# Patient Record
Sex: Female | Born: 1940 | Race: White | Hispanic: No | Marital: Single | Smoking: Never smoker
Health system: Southern US, Community
[De-identification: ages and names within clinical notes are randomized; demographics above are authoritative.]

## PROBLEM LIST (undated history)

## (undated) DIAGNOSIS — Z9289 Personal history of other medical treatment: Secondary | ICD-10-CM

## (undated) DIAGNOSIS — F419 Anxiety disorder, unspecified: Secondary | ICD-10-CM

## (undated) DIAGNOSIS — A048 Other specified bacterial intestinal infections: Secondary | ICD-10-CM

## (undated) DIAGNOSIS — L03119 Cellulitis of unspecified part of limb: Secondary | ICD-10-CM

## (undated) DIAGNOSIS — M1990 Unspecified osteoarthritis, unspecified site: Secondary | ICD-10-CM

## (undated) DIAGNOSIS — E039 Hypothyroidism, unspecified: Secondary | ICD-10-CM

## (undated) DIAGNOSIS — H269 Unspecified cataract: Secondary | ICD-10-CM

## (undated) DIAGNOSIS — Z8711 Personal history of peptic ulcer disease: Secondary | ICD-10-CM

## (undated) DIAGNOSIS — G4730 Sleep apnea, unspecified: Secondary | ICD-10-CM

## (undated) DIAGNOSIS — J189 Pneumonia, unspecified organism: Secondary | ICD-10-CM

## (undated) DIAGNOSIS — I509 Heart failure, unspecified: Secondary | ICD-10-CM

## (undated) DIAGNOSIS — K219 Gastro-esophageal reflux disease without esophagitis: Secondary | ICD-10-CM

## (undated) DIAGNOSIS — I639 Cerebral infarction, unspecified: Secondary | ICD-10-CM

## (undated) DIAGNOSIS — E7800 Pure hypercholesterolemia, unspecified: Secondary | ICD-10-CM

## (undated) DIAGNOSIS — I959 Hypotension, unspecified: Secondary | ICD-10-CM

## (undated) DIAGNOSIS — I5022 Chronic systolic (congestive) heart failure: Secondary | ICD-10-CM

## (undated) DIAGNOSIS — L02419 Cutaneous abscess of limb, unspecified: Secondary | ICD-10-CM

## (undated) DIAGNOSIS — H409 Unspecified glaucoma: Secondary | ICD-10-CM

## (undated) DIAGNOSIS — I10 Essential (primary) hypertension: Secondary | ICD-10-CM

## (undated) DIAGNOSIS — I5023 Acute on chronic systolic (congestive) heart failure: Secondary | ICD-10-CM

## (undated) DIAGNOSIS — D649 Anemia, unspecified: Secondary | ICD-10-CM

## (undated) DIAGNOSIS — J42 Unspecified chronic bronchitis: Secondary | ICD-10-CM

## (undated) HISTORY — PX: NISSEN FUNDOPLICATION: SHX2091

## (undated) HISTORY — DX: Unspecified cataract: H26.9

## (undated) HISTORY — PX: BACK SURGERY: SHX140

## (undated) HISTORY — DX: Unspecified glaucoma: H40.9

## (undated) HISTORY — DX: Hypotension, unspecified: I95.9

## (undated) HISTORY — DX: Cellulitis of unspecified part of limb: L03.119

## (undated) HISTORY — DX: Cellulitis of unspecified part of limb: L02.419

## (undated) HISTORY — PX: THYROIDECTOMY: SHX17

## (undated) HISTORY — PX: TONSILLECTOMY: SUR1361

## (undated) HISTORY — DX: Chronic systolic (congestive) heart failure: I50.22

## (undated) HISTORY — PX: TENDON REPAIR: SHX5111

## (undated) HISTORY — DX: Other specified bacterial intestinal infections: A04.8

## (undated) HISTORY — DX: Acute on chronic systolic (congestive) heart failure: I50.23

---

## 2000-01-22 ENCOUNTER — Ambulatory Visit (HOSPITAL_COMMUNITY): Admission: RE | Admit: 2000-01-22 | Discharge: 2000-01-22 | Payer: Self-pay | Admitting: Gastroenterology

## 2001-09-30 ENCOUNTER — Encounter: Admission: RE | Admit: 2001-09-30 | Discharge: 2001-09-30 | Payer: Self-pay | Admitting: *Deleted

## 2003-05-06 ENCOUNTER — Encounter: Payer: Self-pay | Admitting: Internal Medicine

## 2003-05-06 ENCOUNTER — Encounter: Admission: RE | Admit: 2003-05-06 | Discharge: 2003-05-06 | Payer: Self-pay | Admitting: Internal Medicine

## 2007-09-23 ENCOUNTER — Inpatient Hospital Stay (HOSPITAL_COMMUNITY): Admission: EM | Admit: 2007-09-23 | Discharge: 2007-09-28 | Payer: Self-pay | Admitting: Emergency Medicine

## 2007-09-24 HISTORY — PX: HEMIARTHROPLASTY HIP: SUR652

## 2007-09-28 ENCOUNTER — Inpatient Hospital Stay: Admission: AD | Admit: 2007-09-28 | Discharge: 2007-11-17 | Payer: Self-pay | Admitting: Internal Medicine

## 2007-10-13 ENCOUNTER — Ambulatory Visit (HOSPITAL_COMMUNITY): Admission: RE | Admit: 2007-10-13 | Discharge: 2007-10-13 | Payer: Self-pay | Admitting: Internal Medicine

## 2009-05-12 ENCOUNTER — Encounter: Admission: RE | Admit: 2009-05-12 | Discharge: 2009-05-12 | Payer: Self-pay | Admitting: Neurology

## 2009-05-28 ENCOUNTER — Encounter: Admission: RE | Admit: 2009-05-28 | Discharge: 2009-05-28 | Payer: Self-pay | Admitting: Neurology

## 2009-06-06 ENCOUNTER — Encounter: Admission: RE | Admit: 2009-06-06 | Discharge: 2009-06-06 | Payer: Self-pay | Admitting: Neurology

## 2009-06-23 HISTORY — PX: KYPHOPLASTY: SHX5884

## 2009-07-13 ENCOUNTER — Ambulatory Visit (HOSPITAL_COMMUNITY): Admission: RE | Admit: 2009-07-13 | Discharge: 2009-07-14 | Payer: Self-pay | Admitting: Orthopedic Surgery

## 2010-10-22 ENCOUNTER — Other Ambulatory Visit: Payer: Self-pay | Admitting: Internal Medicine

## 2010-10-22 DIAGNOSIS — Z1239 Encounter for other screening for malignant neoplasm of breast: Secondary | ICD-10-CM

## 2010-11-08 ENCOUNTER — Ambulatory Visit: Payer: Self-pay

## 2010-11-22 ENCOUNTER — Ambulatory Visit
Admission: RE | Admit: 2010-11-22 | Discharge: 2010-11-22 | Disposition: A | Discharge status: Home / Self Care | Payer: Medicare Other | Source: Ambulatory Visit | Attending: *Deleted | Admitting: *Deleted

## 2010-11-22 DIAGNOSIS — Z1239 Encounter for other screening for malignant neoplasm of breast: Secondary | ICD-10-CM

## 2010-12-27 LAB — BASIC METABOLIC PANEL
BUN: 11 mg/dL (ref 6–23)
CO2: 28 mEq/L (ref 19–32)
Chloride: 108 mEq/L (ref 96–112)
Creatinine, Ser: 0.63 mg/dL (ref 0.4–1.2)
GFR calc non Af Amer: >60 mL/min (ref >60)
Potassium: 4.5 mEq/L (ref 3.5–5.1)
Sodium: 142 mEq/L (ref 135–145)

## 2010-12-27 LAB — CBC
HCT: 43.5 % (ref 36.0–46.0)
RDW: 14.4 % (ref 11.5–15.5)
WBC: 8.2 10*3/uL (ref 4.0–10.5)

## 2011-02-05 NOTE — Discharge Summary (Signed)
[name], [name] NO.:  [id number]   MEDICAL RECORD NO.:  [id number]          PATIENT TYPE:  INP   LOCATION:  5019                         FACILITY:  [facility]   PHYSICIAN:  Almedia Balls. Ranell Patrick, M.D. DATE OF BIRTH:  [birth year]   DATE OF ADMISSION:  09/23/2007  DATE OF DISCHARGE:  09/28/2007                               DISCHARGE SUMMARY   ADMISSION DIAGNOSES:  1. Right displaced femoral neck fracture.  2. Gluteus medius tear.   DISCHARGE DIAGNOSES:  1. Right displaced femoral neck fracture.  2. Gluteus medius tear.   BRIEF HISTORY:  The patient is a 70 year old female with Parkinson  disease who suffered a ground-level fall, injuring her right hip.  The  patient was patient admitted for surgical management for this right hip  fracture.   PROCEDURE:  The patient had a right hip hemiarthroplasty using the DePuy  Summit basic system and also a gluteus medius tendon repair by Dr.  Malon Kindle on September 24, 2007.  The assistant was [employer].  General anesthesia was used.  Estimated blood loss was 300 mL.  The  fluid replacement was 1200 mL.  No complications.   HOSPITAL COURSE:  The patient admitted on September 24, 2007, for the above-  stated procedure.  After adequate time in the post anesthesia care unit,  she was transferred up to 5000.  Postop day #1 the patient complained  about some moderate pain in the right hip but neurovascularly she was  intact distally.  She was able to perform very limited but gentle range  of motion and exercises with physical therapy over the next several days  before discharge to a skilled nursing facility.  The patient's labs were  within acceptable limits.  She did have some mild blood loss anemia but  not to the extent where she needed a transfusion.  The latest H&H was  9.7 and 28.5.  Daily dressing changes were observed with the Mepilex  dressing.  Neurovascularly she remained intact distally with no pedal  edema and  no calf tenderness.   DISCHARGE PLAN:  The patient will be discharged to a skilled nursing  facility on September 28, 2007.   The patient has allergies CODEINE and PENICILLIN.   DISCHARGE MEDICATIONS:  1. Zebeta 500 mg p.o. daily.  2. Carbidopa-levodopa 10 mg p.o. q.i.d.  3. Hydrochlorothiazide 6.25 mg p.o. daily.  4. Coumadin per pharmacy protocol.  5. Robaxin 500 mg one tablet q.6h, p.r.n. spasm.  6. Percocet one to two tablets q.4-6h. p.r.n. pain, that is 5 mg of      the Percocet.   FOLLOW-UP:  The patient will follow back up with Dr. Malon Kindle.   CONDITION:  Guarded.   DIET:  Regular.      Thomas B. Durwin Nora, P.A.      Almedia Balls. Ranell Patrick, M.D.  Electronically Signed    TBD/MEDQ  D:  09/25/2007  T:  09/25/2007  Job:  [id number]

## 2011-02-05 NOTE — Op Note (Signed)
[name], [name] NO.:  [id number]   MEDICAL RECORD NO.:  [id number]          PATIENT TYPE:  INP   LOCATION:  5019                         FACILITY:  [facility]   PHYSICIAN:  Almedia Balls. Ranell Patrick, M.D. DATE OF BIRTH:  1941/02/03   DATE OF PROCEDURE:  09/24/2007  DATE OF DISCHARGE:                               OPERATIVE REPORT   PREOPERATIVE DIAGNOSIS:  Right displaced femoral neck fracture.   POSTOPERATIVE DIAGNOSIS:  Right displaced femoral neck fracture, gluteus  medius tear.   OPERATION PERFORMED:  Right hip hemiarthroplasty using Depuy Summit  system.  Gluteus medius tendon repair.   SURGEON:  Almedia Balls. Ranell Patrick, M.D.   ASSISTANT:  Donnie Coffin. Durwin Nora, P.A.   ANESTHESIA:  General.   ESTIMATED BLOOD LOSS:  300 mL.   FLUIDS REPLACED:  1200 mL crystalloid.   URINE OUTPUT:  250 mL.   INSTRUMENT COUNT:  Correct.   COMPLICATIONS:  None.   ANTIBIOTICS:  Perioperative antibiotics given.   INDICATIONS FOR PROCEDURE:  The patient is a 70 year old female with  Parkinson's who suffered ground level fall while taking out the trash.  The patient presented to the [facility] Emergency Department with a  displaced femoral neck fracture unable to walk.  The patient now  presents for operative treatment to restore proximal femoral anatomy and  allow for mobilization.  Informed consent was obtained.   DESCRIPTION OF PROCEDURE:  After an adequate level of anesthesia was  achieved, the patient was positioned in the left lateral decubitus  position with the right hip up.  After sterile prep and drape of the  right hip, we approached the hip through a posterior Kocher-Langebach  incision.  Dissection was carried down through subcutaneous tissues.  Bovie electrocautery used to control hemostasis.  I identified the  tensor fascia lata, split that in line with the incision, identified the  gluteus medius which was torn.  Noted the gluteus medius to be split and  essentially the  trochanter was button holed through that.  We made plans  for repairing that at the end of the surgery.  At this point we  identified and we retracted the split portions of the gluteus medius and  then divided the short external rotators off the posterior femur.  We  made our neck cut using a broach as a guide one fingerbreadth above the  lesser trochanter.  We then removed the head and sized this to a 45  head.  We trialed that in the hip socket and removed the ligamentum  teres.  The trial was secure with a nice suction fit.  We then prepared  the femoral canal with sequential reaming and broaching up to a size 2  stem.  With this in place, we trialed with a +0 neck and a 45 head and  had nice length and soft tissue balance.  We removed the trial  components and then cemented using the cement restrictor and third  generation vacuum mixing technique and pressurization the Summit basic  stem size 2 with appropriate anteversion.  Once this was allowed to  harden, we added the +0 neck and 45 head, impacted that in place,  reduced the hip, had nice balance and soft tissue tension and stability.  We then repaired the piriformis back to the gluteus medius to itself  with interrupted Ethibond suture which was figure-of-eight x4 stitches.  This nicely reapproximated the gluteus medius and no longer was the  greater trochanter button holing through that and restored the integrity  of that tendon.  We then closed the tensor fascia lata over a drain with  interrupted figure-of-eight #1 Vicryl suture followed by 2-0 Vicryl  subcutaneous closure with 4-0 Monocryl for the skin.  Steri-Strips were  applied followed by sterile dressing.  The patient tolerated the surgery  well.      Almedia Balls. Ranell Patrick, M.D.  Electronically Signed     SRN/MEDQ  D:  09/24/2007  T:  09/24/2007  Job:  [id number]

## 2011-02-08 NOTE — Procedures (Signed)
[place]  Patient:    [name], [name]                         MRN: [id number] Proc. Date: 01/22/00 Adm. Date:  [id number] Attending:  Charna Elizabeth CC:         Warrick Parisian, M.D.                           Procedure Report  DATE OF BIRTH:  [birth year]  REFERRING PHYSICIAN:  Warrick Parisian, M.D.  PROCEDURE PERFORMED:  Screening flexible sigmoidoscopy.  ENDOSCOPIST:  Anselmo Rod, M.D.  INSTRUMENT USED:  Olympus video colonoscope.  INDICATIONS:  Colorectal cancer screening in a 70 year old white female with no history of blood in stool or family history of colon cancer, change in bowel habits, etc.  PREPROCEDURE PREPARATION:  Informed consent was procured from the patient. The patient was fasted for 8 hours prior to the procedure and prepped with two Fleets enemas the morning of the procedure.  PREPROCEDURE PHYSICAL:  Patient has stable vital signs.  NECK: Supple.  CHEST:  Clear to auscultation. S1, S2 regular.  ABDOMEN:  Soft with normal abdominal bowel sounds.  DESCRIPTION OF PROCEDURE:  The patient was placed in left lateral decubitus position and no sedation was used.  Once the patient was adequately positioned, the Olympus video colonoscope was advanced from the rectum to 70 cm without difficulty.  Except for a few early left-sided diverticula, no other abnormalities were seen.  The patient had small internal and small external hemorrhoid.  No mass or polyps were present.  There was solid stool present at 70 cm and therefore the procedure was aborted at this point.  IMPRESSION: 1. No masses or polyps present up to 70 cm. 2. Early left-sided diverticular disease. 3. Small internal and external hemorrhoids.  RECOMMENDATIONS: 1. The patient has been advised to increase the fluid and fiber in her diet. 2. Repeat colorectal cancer screening was recommended in the next five years    or earlier if need be. DD:   01/22/00 TD:  01/23/00 Job: [id number]

## 2011-06-13 LAB — PROTIME-INR
INR: 1.1
INR: 1.6 — ABNORMAL HIGH
Prothrombin Time: 14.9
Prothrombin Time: 16.7 — ABNORMAL HIGH
Prothrombin Time: 19.3 — ABNORMAL HIGH

## 2011-06-13 LAB — BASIC METABOLIC PANEL
BUN: 10
BUN: 10
BUN: 7
CO2: 26
CO2: 27
CO2: 28
Calcium: 7.8 — ABNORMAL LOW
Calcium: 8 — ABNORMAL LOW
Calcium: 8.2 — ABNORMAL LOW
Calcium: 8.3 — ABNORMAL LOW
Chloride: 100
Creatinine, Ser: 0.66
GFR calc Af Amer: >60
GFR calc non Af Amer: >60
GFR calc non Af Amer: >60
Glucose, Bld: 115 — ABNORMAL HIGH
Glucose, Bld: 130 — ABNORMAL HIGH
Potassium: 3.5
Sodium: 134 — ABNORMAL LOW
Sodium: 135
Sodium: 137

## 2011-06-13 LAB — CBC
HCT: 28.5 — ABNORMAL LOW
Hemoglobin: 10.6 — ABNORMAL LOW
Hemoglobin: 9.9 — ABNORMAL LOW
MCV: 86.8
Platelets: 109 — ABNORMAL LOW
Platelets: 112 — ABNORMAL LOW
RBC: 3.37 — ABNORMAL LOW
RBC: 3.66 — ABNORMAL LOW
RDW: 13.7
WBC: 11.5 — ABNORMAL HIGH
WBC: 9

## 2011-06-28 LAB — CBC
HCT: 39.3
Hemoglobin: 13.1
MCV: 87.3
Platelets: 171
RDW: 13.9
WBC: 17.2 — ABNORMAL HIGH

## 2011-06-28 LAB — DIFFERENTIAL
Basophils Absolute: 0
Eosinophils Relative: 0
Lymphocytes Relative: 5 — ABNORMAL LOW
Lymphs Abs: 0.9
Monocytes Absolute: 1
Neutro Abs: 15.3 — ABNORMAL HIGH

## 2011-06-28 LAB — URINALYSIS, ROUTINE W REFLEX MICROSCOPIC
Nitrite: NEGATIVE
Specific Gravity, Urine: 1.027
Urobilinogen, UA: 0.2
pH: 5

## 2011-06-28 LAB — I-STAT 8, (EC8 V) (CONVERTED LAB)
BUN: 19
Bicarbonate: 23.3
Glucose, Bld: 142 — ABNORMAL HIGH
HCT: 43
Hemoglobin: 14.6
Operator id: 208821
Potassium: 3.9
Sodium: 139
TCO2: 24

## 2011-06-28 LAB — PROTIME-INR: INR: 0.9

## 2011-06-28 LAB — ABO/RH: ABO/RH(D): O POS

## 2011-06-28 LAB — TYPE AND SCREEN

## 2011-06-28 LAB — APTT: aPTT: 28

## 2011-09-13 DIAGNOSIS — G20 Parkinson's disease: Secondary | ICD-10-CM | POA: Diagnosis not present

## 2011-09-13 DIAGNOSIS — R279 Unspecified lack of coordination: Secondary | ICD-10-CM | POA: Diagnosis not present

## 2011-09-13 DIAGNOSIS — R269 Unspecified abnormalities of gait and mobility: Secondary | ICD-10-CM | POA: Diagnosis not present

## 2011-09-13 DIAGNOSIS — M25519 Pain in unspecified shoulder: Secondary | ICD-10-CM | POA: Diagnosis not present

## 2011-09-13 DIAGNOSIS — IMO0001 Reserved for inherently not codable concepts without codable children: Secondary | ICD-10-CM | POA: Diagnosis not present

## 2011-09-13 DIAGNOSIS — M159 Polyosteoarthritis, unspecified: Secondary | ICD-10-CM | POA: Diagnosis not present

## 2011-09-13 DIAGNOSIS — I10 Essential (primary) hypertension: Secondary | ICD-10-CM | POA: Diagnosis not present

## 2011-09-13 DIAGNOSIS — M545 Low back pain: Secondary | ICD-10-CM | POA: Diagnosis not present

## 2011-09-20 DIAGNOSIS — R269 Unspecified abnormalities of gait and mobility: Secondary | ICD-10-CM | POA: Diagnosis not present

## 2011-09-20 DIAGNOSIS — I10 Essential (primary) hypertension: Secondary | ICD-10-CM | POA: Diagnosis not present

## 2011-09-20 DIAGNOSIS — IMO0001 Reserved for inherently not codable concepts without codable children: Secondary | ICD-10-CM | POA: Diagnosis not present

## 2011-09-20 DIAGNOSIS — M159 Polyosteoarthritis, unspecified: Secondary | ICD-10-CM | POA: Diagnosis not present

## 2011-09-20 DIAGNOSIS — G20 Parkinson's disease: Secondary | ICD-10-CM | POA: Diagnosis not present

## 2011-09-20 DIAGNOSIS — R279 Unspecified lack of coordination: Secondary | ICD-10-CM | POA: Diagnosis not present

## 2011-09-21 DIAGNOSIS — I10 Essential (primary) hypertension: Secondary | ICD-10-CM | POA: Diagnosis not present

## 2011-09-21 DIAGNOSIS — G20 Parkinson's disease: Secondary | ICD-10-CM | POA: Diagnosis not present

## 2011-09-21 DIAGNOSIS — R269 Unspecified abnormalities of gait and mobility: Secondary | ICD-10-CM | POA: Diagnosis not present

## 2011-09-21 DIAGNOSIS — IMO0001 Reserved for inherently not codable concepts without codable children: Secondary | ICD-10-CM | POA: Diagnosis not present

## 2011-09-21 DIAGNOSIS — R279 Unspecified lack of coordination: Secondary | ICD-10-CM | POA: Diagnosis not present

## 2011-09-21 DIAGNOSIS — M159 Polyosteoarthritis, unspecified: Secondary | ICD-10-CM | POA: Diagnosis not present

## 2011-09-23 DIAGNOSIS — M159 Polyosteoarthritis, unspecified: Secondary | ICD-10-CM | POA: Diagnosis not present

## 2011-09-23 DIAGNOSIS — G20 Parkinson's disease: Secondary | ICD-10-CM | POA: Diagnosis not present

## 2011-09-23 DIAGNOSIS — R279 Unspecified lack of coordination: Secondary | ICD-10-CM | POA: Diagnosis not present

## 2011-09-23 DIAGNOSIS — I10 Essential (primary) hypertension: Secondary | ICD-10-CM | POA: Diagnosis not present

## 2011-09-23 DIAGNOSIS — R269 Unspecified abnormalities of gait and mobility: Secondary | ICD-10-CM | POA: Diagnosis not present

## 2011-09-23 DIAGNOSIS — IMO0001 Reserved for inherently not codable concepts without codable children: Secondary | ICD-10-CM | POA: Diagnosis not present

## 2011-09-25 ENCOUNTER — Inpatient Hospital Stay (HOSPITAL_COMMUNITY)
Admission: EM | Admit: 2011-09-25 | Discharge: 2011-09-30 | DRG: 377 | Disposition: A | Discharge status: Home Health | Payer: Medicare Other | Attending: Internal Medicine | Admitting: Internal Medicine

## 2011-09-25 ENCOUNTER — Encounter: Payer: Self-pay | Admitting: Emergency Medicine

## 2011-09-25 DIAGNOSIS — J690 Pneumonitis due to inhalation of food and vomit: Secondary | ICD-10-CM | POA: Diagnosis present

## 2011-09-25 DIAGNOSIS — D62 Acute posthemorrhagic anemia: Secondary | ICD-10-CM | POA: Diagnosis present

## 2011-09-25 DIAGNOSIS — Z880 Allergy status to penicillin: Secondary | ICD-10-CM

## 2011-09-25 DIAGNOSIS — D509 Iron deficiency anemia, unspecified: Secondary | ICD-10-CM | POA: Diagnosis present

## 2011-09-25 DIAGNOSIS — E0789 Other specified disorders of thyroid: Secondary | ICD-10-CM | POA: Diagnosis present

## 2011-09-25 DIAGNOSIS — K922 Gastrointestinal hemorrhage, unspecified: Secondary | ICD-10-CM | POA: Diagnosis not present

## 2011-09-25 DIAGNOSIS — R1110 Vomiting, unspecified: Secondary | ICD-10-CM | POA: Diagnosis not present

## 2011-09-25 DIAGNOSIS — D72829 Elevated white blood cell count, unspecified: Secondary | ICD-10-CM | POA: Diagnosis present

## 2011-09-25 DIAGNOSIS — G20 Parkinson's disease: Secondary | ICD-10-CM | POA: Diagnosis present

## 2011-09-25 DIAGNOSIS — K920 Hematemesis: Secondary | ICD-10-CM | POA: Diagnosis not present

## 2011-09-25 DIAGNOSIS — K254 Chronic or unspecified gastric ulcer with hemorrhage: Secondary | ICD-10-CM | POA: Diagnosis not present

## 2011-09-25 DIAGNOSIS — J984 Other disorders of lung: Secondary | ICD-10-CM | POA: Diagnosis not present

## 2011-09-25 DIAGNOSIS — I959 Hypotension, unspecified: Secondary | ICD-10-CM | POA: Diagnosis present

## 2011-09-25 DIAGNOSIS — Z886 Allergy status to analgesic agent status: Secondary | ICD-10-CM

## 2011-09-25 DIAGNOSIS — R269 Unspecified abnormalities of gait and mobility: Secondary | ICD-10-CM | POA: Diagnosis not present

## 2011-09-25 DIAGNOSIS — R079 Chest pain, unspecified: Secondary | ICD-10-CM | POA: Diagnosis not present

## 2011-09-25 DIAGNOSIS — I10 Essential (primary) hypertension: Secondary | ICD-10-CM | POA: Diagnosis not present

## 2011-09-25 DIAGNOSIS — R279 Unspecified lack of coordination: Secondary | ICD-10-CM | POA: Diagnosis not present

## 2011-09-25 DIAGNOSIS — K208 Other esophagitis without bleeding: Secondary | ICD-10-CM | POA: Diagnosis present

## 2011-09-25 DIAGNOSIS — K259 Gastric ulcer, unspecified as acute or chronic, without hemorrhage or perforation: Secondary | ICD-10-CM | POA: Diagnosis present

## 2011-09-25 DIAGNOSIS — R918 Other nonspecific abnormal finding of lung field: Secondary | ICD-10-CM | POA: Diagnosis not present

## 2011-09-25 DIAGNOSIS — IMO0001 Reserved for inherently not codable concepts without codable children: Secondary | ICD-10-CM | POA: Diagnosis not present

## 2011-09-25 DIAGNOSIS — K209 Esophagitis, unspecified without bleeding: Secondary | ICD-10-CM | POA: Diagnosis present

## 2011-09-25 DIAGNOSIS — G20A1 Parkinson's disease without dyskinesia, without mention of fluctuations: Secondary | ICD-10-CM | POA: Diagnosis present

## 2011-09-25 DIAGNOSIS — K2210 Ulcer of esophagus without bleeding: Secondary | ICD-10-CM

## 2011-09-25 DIAGNOSIS — M159 Polyosteoarthritis, unspecified: Secondary | ICD-10-CM | POA: Diagnosis not present

## 2011-09-25 HISTORY — DX: Anemia, unspecified: D64.9

## 2011-09-25 HISTORY — DX: Essential (primary) hypertension: I10

## 2011-09-25 HISTORY — DX: Unspecified osteoarthritis, unspecified site: M19.90

## 2011-09-25 LAB — COMPREHENSIVE METABOLIC PANEL
ALT: 8 U/L (ref 0–35)
Alkaline Phosphatase: 68 U/L (ref 39–117)
BUN: 21 mg/dL (ref 6–23)
CO2: 28 mEq/L (ref 19–32)
Chloride: 105 mEq/L (ref 96–112)
GFR calc Af Amer: 77 mL/min — ABNORMAL LOW (ref >90)
Glucose, Bld: 143 mg/dL — ABNORMAL HIGH (ref 70–99)
Potassium: 4.8 mEq/L (ref 3.5–5.1)
Sodium: 139 mEq/L (ref 135–145)
Total Bilirubin: 0.2 mg/dL — ABNORMAL LOW (ref 0.3–1.2)
Total Protein: 6 g/dL (ref 6.0–8.3)

## 2011-09-25 LAB — DIFFERENTIAL
Eosinophils Absolute: 0.1 10*3/uL (ref 0.0–0.7)
Lymphocytes Relative: 8 % — ABNORMAL LOW (ref 12–46)
Lymphs Abs: 0.9 10*3/uL (ref 0.7–4.0)
Monocytes Relative: 6 % (ref 3–12)
Neutro Abs: 9.6 10*3/uL — ABNORMAL HIGH (ref 1.7–7.7)
Neutrophils Relative %: 84 % — ABNORMAL HIGH (ref 43–77)

## 2011-09-25 LAB — CBC
Hemoglobin: 9.9 g/dL — ABNORMAL LOW (ref 12.0–15.0)
Platelets: 221 10*3/uL (ref 150–400)
RBC: 3.66 MIL/uL — ABNORMAL LOW (ref 3.87–5.11)
WBC: 11.4 10*3/uL — ABNORMAL HIGH (ref 4.0–10.5)

## 2011-09-25 MED ORDER — ONDANSETRON HCL 4 MG/2ML IJ SOLN
4.0000 mg | Freq: Once | INTRAMUSCULAR | Status: AC
  Administered 2011-09-25: 4 mg via INTRAVENOUS
  Filled 2011-09-25 (×2): qty 2

## 2011-09-25 MED ORDER — SODIUM CHLORIDE 0.9 % IV BOLUS (SEPSIS)
1000.0000 mL | Freq: Once | INTRAVENOUS | Status: AC
  Administered 2011-09-25: 1000 mL via INTRAVENOUS

## 2011-09-25 MED ORDER — PANTOPRAZOLE SODIUM 40 MG IV SOLR
40.0000 mg | Freq: Once | INTRAVENOUS | Status: AC
  Administered 2011-09-25: 40 mg via INTRAVENOUS
  Filled 2011-09-25: qty 40

## 2011-09-25 NOTE — ED Provider Notes (Signed)
History     CSN: [id number]  Arrival date & time 09/25/11  2211   First MD Initiated Contact with Patient 09/25/11 2330      Chief Complaint  Patient presents with  . GI Bleeding    (Consider location/radiation/quality/duration/timing/severity/associated sxs/prior treatment) HPI  Past Medical History  Diagnosis Date  . Parkinson's disease   . Hypertension   . Arthritis   . Anemia     Past Surgical History  Procedure Date  . Hip arthroscopy w/ labral repair   . Thyroidectomy     No family history on file.  History  Substance Use Topics  . Smoking status: Never Smoker   . Smokeless tobacco: Not on file  . Alcohol Use: No    OB History    Grav Para Term Preterm Abortions TAB SAB Ect Mult Living                  Review of Systems  Allergies  Codeine and Penicillins  Home Medications   Current Outpatient Rx  Name Route Sig Dispense Refill  . ASPIRIN 81 MG PO CHEW Oral Chew 81 mg by mouth daily.      . B COMPLEX PO TABS Oral Take 1 tablet by mouth daily.      Marland Kitchen BISOPROLOL FUMARATE 5 MG PO TABS Oral Take 5 mg by mouth daily.      Marland Kitchen CARBIDOPA-LEVODOPA ER 50-200 MG PO TBCR Oral Take 1 tablet by mouth 2 (two) times daily.      . FUROSEMIDE 40 MG PO TABS Oral Take 40 mg by mouth daily.      Marland Kitchen MAGNESIUM OXIDE 400 MG PO TABS Oral Take 400 mg by mouth daily.      Marland Kitchen POTASSIUM CHLORIDE CRYS ER 20 MEQ PO TBCR Oral Take 20 mEq by mouth daily.        BP 110/51  Pulse 72  Temp(Src) 98 F (36.7 C) (Oral)  Resp 20  SpO2 96%  Physical Exam  ED Course  Procedures (including critical care time)  Labs Reviewed  CBC - Abnormal; Notable for the following:    WBC 11.4 (*)    RBC 3.66 (*)    Hemoglobin 9.9 (*)    HCT 31.7 (*)    All other components within normal limits  DIFFERENTIAL - Abnormal; Notable for the following:    Neutrophils Relative 84 (*)    Neutro Abs 9.6 (*)    Lymphocytes Relative 8 (*)    All other components within normal limits    COMPREHENSIVE METABOLIC PANEL - Abnormal; Notable for the following:    Glucose, Bld 143 (*)    Albumin 3.0 (*)    Total Bilirubin 0.2 (*)    GFR calc non Af Amer 66 (*)    GFR calc Af Amer 77 (*)    All other components within normal limits  TYPE AND SCREEN  POCT OCCULT BLOOD STOOL, DEVICE   No results found.   No diagnosis found.    MDM  Not my patient        Doug Sou, MD 09/26/11 [phone]

## 2011-09-25 NOTE — ED Notes (Signed)
PT. REPORTS BLOODY EMESIS ONSET THIS EVENING , DENIES PAIN OR DISCOMFORT , NO DIARRHEA , NO FEVER OR CHILLS.

## 2011-09-26 ENCOUNTER — Encounter (HOSPITAL_COMMUNITY): Payer: Self-pay | Admitting: Internal Medicine

## 2011-09-26 ENCOUNTER — Encounter (HOSPITAL_COMMUNITY)
Admission: EM | Disposition: A | Discharge status: Home Health | Payer: Self-pay | Source: Home / Self Care | Attending: Internal Medicine

## 2011-09-26 ENCOUNTER — Inpatient Hospital Stay (HOSPITAL_COMMUNITY): Payer: Medicare Other

## 2011-09-26 ENCOUNTER — Emergency Department (HOSPITAL_COMMUNITY): Payer: Medicare Other

## 2011-09-26 ENCOUNTER — Other Ambulatory Visit: Payer: Self-pay

## 2011-09-26 DIAGNOSIS — I959 Hypotension, unspecified: Secondary | ICD-10-CM | POA: Diagnosis present

## 2011-09-26 DIAGNOSIS — K922 Gastrointestinal hemorrhage, unspecified: Secondary | ICD-10-CM

## 2011-09-26 DIAGNOSIS — K2210 Ulcer of esophagus without bleeding: Secondary | ICD-10-CM | POA: Diagnosis present

## 2011-09-26 DIAGNOSIS — D62 Acute posthemorrhagic anemia: Secondary | ICD-10-CM

## 2011-09-26 DIAGNOSIS — K250 Acute gastric ulcer with hemorrhage: Secondary | ICD-10-CM | POA: Diagnosis not present

## 2011-09-26 DIAGNOSIS — D509 Iron deficiency anemia, unspecified: Secondary | ICD-10-CM | POA: Diagnosis present

## 2011-09-26 DIAGNOSIS — K254 Chronic or unspecified gastric ulcer with hemorrhage: Secondary | ICD-10-CM | POA: Diagnosis not present

## 2011-09-26 DIAGNOSIS — Z880 Allergy status to penicillin: Secondary | ICD-10-CM | POA: Diagnosis not present

## 2011-09-26 DIAGNOSIS — D72829 Elevated white blood cell count, unspecified: Secondary | ICD-10-CM | POA: Diagnosis present

## 2011-09-26 DIAGNOSIS — I10 Essential (primary) hypertension: Secondary | ICD-10-CM | POA: Diagnosis present

## 2011-09-26 DIAGNOSIS — Z09 Encounter for follow-up examination after completed treatment for conditions other than malignant neoplasm: Secondary | ICD-10-CM | POA: Diagnosis not present

## 2011-09-26 DIAGNOSIS — R079 Chest pain, unspecified: Secondary | ICD-10-CM | POA: Diagnosis not present

## 2011-09-26 DIAGNOSIS — R1110 Vomiting, unspecified: Secondary | ICD-10-CM | POA: Diagnosis not present

## 2011-09-26 DIAGNOSIS — G20 Parkinson's disease: Secondary | ICD-10-CM | POA: Diagnosis present

## 2011-09-26 DIAGNOSIS — R918 Other nonspecific abnormal finding of lung field: Secondary | ICD-10-CM | POA: Diagnosis not present

## 2011-09-26 DIAGNOSIS — Z886 Allergy status to analgesic agent status: Secondary | ICD-10-CM | POA: Diagnosis not present

## 2011-09-26 DIAGNOSIS — K208 Other esophagitis without bleeding: Secondary | ICD-10-CM | POA: Diagnosis not present

## 2011-09-26 DIAGNOSIS — J690 Pneumonitis due to inhalation of food and vomit: Secondary | ICD-10-CM | POA: Diagnosis not present

## 2011-09-26 DIAGNOSIS — K920 Hematemesis: Secondary | ICD-10-CM

## 2011-09-26 DIAGNOSIS — G20A1 Parkinson's disease without dyskinesia, without mention of fluctuations: Secondary | ICD-10-CM | POA: Diagnosis not present

## 2011-09-26 DIAGNOSIS — J984 Other disorders of lung: Secondary | ICD-10-CM | POA: Diagnosis not present

## 2011-09-26 HISTORY — PX: ESOPHAGOGASTRODUODENOSCOPY: SHX5428

## 2011-09-26 LAB — CBC
HCT: 23.8 % — ABNORMAL LOW (ref 36.0–46.0)
HCT: 28.6 % — ABNORMAL LOW (ref 36.0–46.0)
Hemoglobin: 7.4 g/dL — ABNORMAL LOW (ref 12.0–15.0)
Hemoglobin: 9.4 g/dL — ABNORMAL LOW (ref 12.0–15.0)
MCHC: 32.9 g/dL (ref 30.0–36.0)
MCV: 86.5 fL (ref 78.0–100.0)
Platelets: 193 10*3/uL (ref 150–400)
RBC: 2.75 MIL/uL — ABNORMAL LOW (ref 3.87–5.11)
RBC: 3.39 MIL/uL — ABNORMAL LOW (ref 3.87–5.11)
WBC: 9 10*3/uL (ref 4.0–10.5)

## 2011-09-26 LAB — HEMOGLOBIN AND HEMATOCRIT, BLOOD: Hemoglobin: 9 g/dL — ABNORMAL LOW (ref 12.0–15.0)

## 2011-09-26 SURGERY — EGD (ESOPHAGOGASTRODUODENOSCOPY)
Anesthesia: Moderate Sedation

## 2011-09-26 MED ORDER — BUTAMBEN-TETRACAINE-BENZOCAINE 2-2-14 % EX AERO
INHALATION_SPRAY | CUTANEOUS | Status: DC | PRN
  Administered 2011-09-26: 2 via TOPICAL

## 2011-09-26 MED ORDER — MIDAZOLAM HCL 10 MG/2ML IJ SOLN
INTRAMUSCULAR | Status: AC
  Filled 2011-09-26: qty 2

## 2011-09-26 MED ORDER — CARBIDOPA-LEVODOPA ER 50-200 MG PO TBCR
1.0000 | EXTENDED_RELEASE_TABLET | Freq: Once | ORAL | Status: AC
  Administered 2011-09-26: 1 via ORAL
  Filled 2011-09-26: qty 1

## 2011-09-26 MED ORDER — FENTANYL CITRATE 0.05 MG/ML IJ SOLN
INTRAMUSCULAR | Status: DC | PRN
  Administered 2011-09-26: 25 ug via INTRAVENOUS

## 2011-09-26 MED ORDER — SODIUM CHLORIDE 0.9 % IJ SOLN
INTRAMUSCULAR | Status: DC | PRN
  Administered 2011-09-26: 16:00:00

## 2011-09-26 MED ORDER — SODIUM CHLORIDE 0.9 % IV SOLN
8.0000 mg/h | INTRAVENOUS | Status: DC
  Administered 2011-09-26 – 2011-09-28 (×5): 8 mg/h via INTRAVENOUS
  Filled 2011-09-26 (×11): qty 80

## 2011-09-26 MED ORDER — ONDANSETRON HCL 4 MG/2ML IJ SOLN: 4.0000 mg | Freq: Four times a day (QID) | INTRAMUSCULAR | Status: DC | PRN

## 2011-09-26 MED ORDER — FENTANYL CITRATE 0.05 MG/ML IJ SOLN
INTRAMUSCULAR | Status: AC
  Filled 2011-09-26: qty 2

## 2011-09-26 MED ORDER — SODIUM CHLORIDE 0.45 % IV SOLN: Freq: Once | INTRAVENOUS | Status: DC

## 2011-09-26 MED ORDER — SODIUM CHLORIDE 0.9 % IV SOLN
INTRAVENOUS | Status: DC
  Administered 2011-09-26 – 2011-09-30 (×8): via INTRAVENOUS
  Filled 2011-09-26: qty 1000

## 2011-09-26 MED ORDER — MIDAZOLAM HCL 10 MG/2ML IJ SOLN
INTRAMUSCULAR | Status: DC | PRN
  Administered 2011-09-26: 2 mg via INTRAVENOUS
  Administered 2011-09-26: 1 mg via INTRAVENOUS

## 2011-09-26 MED ORDER — SODIUM CHLORIDE 0.9 % IV SOLN: INTRAVENOUS | Status: DC

## 2011-09-26 NOTE — Progress Notes (Signed)
[facility]  71 year old female admitted during the night with hematemesis. Hemoglobin at presentation was 9.9. Since admission she has begun passing maroon-colored stools and on rectal exam by the gastroenterology team she appeared to have bright red blood now and her rectal vault. Her most recent hemoglobin had decreased to 7.4 at 5 AM and she is currently in the process of receiving the first of 2 units of packed red blood cells. She is also had intermittent hypotension since admission. Until blood products were available she has been aggressively rehydrated with crystalloids. After arrival to the step down unit her blood pressure dipped as low as 76/30. Upon our initial evaluation of the patient ordered this morning her systolic blood pressure was 85 but she was alert and arousable and able to give appropriate history. Because of concerns of more profound hypotension with sedation for endoscopy procedure that was indicated urgently today because of active GI bleeding Dr. Rhea Belton with gastroenterology service requested we obtain alternative IV access. He also expressed concern she may require pressor support and therefore central line was indicated. In the interim her blood infusion is now running in the 150 cc per hour. Dr. Delton Coombes with critical care medicine was consulted and a central line was subsequently placed without difficulty. Her blood pressure now is much more stable in the 94-100 range systolic. We will continue to monitor for recurrent GI bleeding and follow serial CBC readings. Await results of endoscopic evaluation.  Junious Silk, ANP [phone]

## 2011-09-26 NOTE — H&P (Signed)
[name] is an 71 y.o. female.   Chief Complaint: Vomiting Blood HPI: 71 YO woman here with sudden onset of vomiting blood. She ate dinner earlier in the evening and by 9.30pm, she started having nausea and then vomited frank blood. She has so far vomited 3 times including in the ER. No anticoagulants, no prior history of GI bleed. Takes Baby aspirin only. No melena, no BRBPR. No recent GI workup. Since arriving the ER she has been feeling dizzy and weak. Has also been dropping her Blood pressure. Her initial hemoglobin is 9.9. Her CXR indicated some evidence of aspiration probably from hematemesis but she has no SOB.  Past Medical History  Diagnosis Date  . Parkinson's disease   . Hypertension   . Arthritis   . Anemia     Past Surgical History  Procedure Date  . Hip arthroscopy w/ labral repair   . Thyroidectomy     Family History  Problem Relation Age of Onset  . GER disease Mother    Social History:  reports that she has never smoked. She does not have any smokeless tobacco history on file. She reports that she does not drink alcohol or use illicit drugs.  Allergies:  Allergies  Allergen Reactions  . Codeine Other (See Comments)    unknown  . Penicillins Other (See Comments)    unknown    Medications Prior to Admission  Medication Dose Route Frequency Provider Last Rate Last Dose  . 0.9 %  sodium chloride infusion   Intravenous Continuous Doug Sou, MD      . 0.9 %  sodium chloride infusion   Intravenous Continuous Lawal Cionna Collantes 200 mL/hr at 09/26/11 0342    . ondansetron (ZOFRAN) injection 4 mg  4 mg Intravenous Once Sunnie Nielsen, MD   4 mg at 09/25/11 2350  . ondansetron (ZOFRAN) injection 4 mg  4 mg Intravenous Q6H PRN Lawal Cesareo Vickrey      . pantoprazole (PROTONIX) injection 40 mg  40 mg Intravenous Once Sunnie Nielsen, MD   40 mg at 09/25/11 2352  . sodium chloride 0.9 % bolus 1,000 mL  1,000 mL Intravenous Once Sunnie Nielsen, MD   1,000 mL at 09/25/11 2345   No current  outpatient prescriptions on file as of 09/26/2011.    Results for orders placed during the hospital encounter of 09/25/11 (from the past 48 hour(s))  CBC     Status: Abnormal   Collection Time   09/25/11 10:43 PM      Component Value Range Comment   WBC 11.4 (*) 4.0 - 10.5 (K/uL)    RBC 3.66 (*) 3.87 - 5.11 (MIL/uL)    Hemoglobin 9.9 (*) 12.0 - 15.0 (g/dL)    HCT 16.1 (*) 09.6 - 46.0 (%)    MCV 86.6  78.0 - 100.0 (fL)    MCH 27.0  26.0 - 34.0 (pg)    MCHC 31.2  30.0 - 36.0 (g/dL)    RDW 04.5  40.9 - 81.1 (%)    Platelets 221  150 - 400 (K/uL)   DIFFERENTIAL     Status: Abnormal   Collection Time   09/25/11 10:43 PM      Component Value Range Comment   Neutrophils Relative 84 (*) 43 - 77 (%)    Neutro Abs 9.6 (*) 1.7 - 7.7 (K/uL)    Lymphocytes Relative 8 (*) 12 - 46 (%)    Lymphs Abs 0.9  0.7 - 4.0 (K/uL)    Monocytes Relative 6  3 - 12 (%)    Monocytes Absolute 0.7  0.1 - 1.0 (K/uL)    Eosinophils Relative 1  0 - 5 (%)    Eosinophils Absolute 0.1  0.0 - 0.7 (K/uL)    Basophils Relative 0  0 - 1 (%)    Basophils Absolute 0.0  0.0 - 0.1 (K/uL)   COMPREHENSIVE METABOLIC PANEL     Status: Abnormal   Collection Time   09/25/11 10:43 PM      Component Value Range Comment   Sodium 139  135 - 145 (mEq/L)    Potassium 4.8  3.5 - 5.1 (mEq/L)    Chloride 105  96 - 112 (mEq/L)    CO2 28  19 - 32 (mEq/L)    Glucose, Bld 143 (*) 70 - 99 (mg/dL)    BUN 21  6 - 23 (mg/dL)    Creatinine, Ser 1.61  0.50 - 1.10 (mg/dL)    Calcium 8.9  8.4 - 10.5 (mg/dL)    Total Protein 6.0  6.0 - 8.3 (g/dL)    Albumin 3.0 (*) 3.5 - 5.2 (g/dL)    AST 21  0 - 37 (U/L)    ALT 8  0 - 35 (U/L)    Alkaline Phosphatase 68  39 - 117 (U/L)    Total Bilirubin 0.2 (*) 0.3 - 1.2 (mg/dL)    GFR calc non Af Amer 66 (*) >90 (mL/min)    GFR calc Af Amer 77 (*) >90 (mL/min)   TYPE AND SCREEN     Status: Normal (Preliminary result)   Collection Time   09/25/11 10:45 PM      Component Value Range Comment   ABO/RH(D) O POS       Antibody Screen NEG      Sample Expiration 09/28/2011      Unit Number [id number]      Blood Component Type RED CELLS,LR      Unit division 00      Status of Unit ALLOCATED      Transfusion Status OK TO TRANSFUSE      Crossmatch Result Compatible      Unit Number [id number]      Blood Component Type RED CELLS,LR      Unit division 00      Status of Unit ALLOCATED      Transfusion Status OK TO TRANSFUSE      Crossmatch Result Compatible     OCCULT BLOOD, POC DEVICE     Status: Normal   Collection Time   09/25/11 11:37 PM      Component Value Range Comment   Fecal Occult Bld NEGATIVE     MRSA PCR SCREENING     Status: Normal   Collection Time   09/26/11  3:18 AM      Component Value Range Comment   MRSA by PCR NEGATIVE  NEGATIVE    PREPARE RBC (CROSSMATCH)     Status: Normal   Collection Time   09/26/11  4:00 AM      Component Value Range Comment   Order Confirmation ORDER PROCESSED BY BLOOD BANK      Dg Chest Port 1 View  09/26/2011  *RADIOLOGY REPORT*  Clinical Data: Vomiting.  History of hypertension and Parkinson's disease.  PORTABLE CHEST - 1 VIEW  Comparison: 07/12/2009 and 09/23/2007.  Findings: 0022 hours.  The heart size and mediastinal contours are stable.  There is new patchy retrocardiac opacity associated with blunting of the left costophrenic angle.  The right  lung is clear. There is no edema or pneumothorax.  Telemetry leads overlie the chest.  IMPRESSION: New left lower lobe air space disease with probable adjacent small left pleural effusion.  Findings could reflect atelectasis; aspiration cannot be excluded.  Original Report Authenticated By: Gerrianne Scale, M.D.    Review of Systems  Constitutional: Positive for diaphoresis.  HENT: Negative.   Eyes: Negative.   Respiratory: Negative.   Cardiovascular: Negative.   Gastrointestinal: Positive for heartburn, nausea, vomiting and abdominal pain. Negative for diarrhea, constipation, blood in stool and melena.    Genitourinary: Negative.   Musculoskeletal: Negative.   Skin: Negative.   Neurological: Negative.   Psychiatric/Behavioral: Negative.     Blood pressure 80/43, pulse 73, temperature 98.7 F (37.1 C), temperature source Oral, resp. rate 15, height 5\' 4"  (1.626 m), weight 85.6 kg (188 lb 11.4 oz), SpO2 98.00%. Physical Exam  Constitutional: She is oriented to person, place, and time. She appears well-developed and well-nourished.  HENT:  Head: Normocephalic and atraumatic.  Right Ear: External ear normal.  Left Ear: External ear normal.  Eyes: Conjunctivae and EOM are normal. Pupils are equal, round, and reactive to light.  Neck: Normal range of motion. Neck supple.  Cardiovascular: Normal rate, regular rhythm, normal heart sounds and intact distal pulses.   Respiratory: Effort normal and breath sounds normal.  GI: Soft. Bowel sounds are normal. She exhibits no mass. There is tenderness. There is no rebound and no guarding.  Musculoskeletal: Normal range of motion.  Neurological: She is alert and oriented to person, place, and time. She has normal reflexes.  Skin: Skin is warm and dry.  Psychiatric: She has a normal mood and affect. Her behavior is normal. Judgment and thought content normal.     Assessment/Plan 1. UGIB: Cause unclear Will need urgent EGD. GI called. Will start IV protonix, IVF, send to [facility] and IVF. 2 Widebore IVs as well asserial H/H 2. Aspiration pneumonitis: Will empirically treat for aspiration pneumonia and keep on oxygen. 3. Anemia: ytransfuse 2 units PRBC 4. Hypotension from acute Blood loss. Hydrate and transfuse PRBC 5. Leucocytosis: From acute illness.  Adolphus Hanf,LAWAL 09/26/2011, 5:35 AM

## 2011-09-26 NOTE — ED Provider Notes (Addendum)
History     CSN: [id number]  Arrival date & time 09/25/11  2211   First MD Initiated Contact with Patient 09/25/11 2330      Chief Complaint  Patient presents with  . GI Bleeding    (Consider location/radiation/quality/duration/timing/severity/associated sxs/prior treatment) The history is provided by the patient.   patient states not feeling well for last few days but denies any specific. tonight at home sitting down nauseated and had 2 episodes of bright red bloody emesis. No abdominal discomfort and no black or tarry stools. She denies any visualized blood in her bowel movements. She denies any Coumadin or blood thinners other than a baby aspirin daily. She denies any alcohol use or recent NSAIDs. She denies any history of GI bleeding, varices or liver problems. Moderate in severity. No weakness or dizziness. She still has some nausea now. She denies any other related symptoms. She had one more episode of bloody emesis while in the emergency department.  Past Medical History  Diagnosis Date  . Parkinson's disease   . Hypertension   . Arthritis   . Anemia     Past Surgical History  Procedure Date  . Hip arthroscopy w/ labral repair   . Thyroidectomy     Family History  Problem Relation Age of Onset  . GER disease Mother     History  Substance Use Topics  . Smoking status: Never Smoker   . Smokeless tobacco: Not on file  . Alcohol Use: No    OB History    Grav Para Term Preterm Abortions TAB SAB Ect Mult Living                  Review of Systems  Constitutional: Negative for fever and chills.  HENT: Negative for neck pain and neck stiffness.   Eyes: Negative for pain.  Respiratory: Negative for shortness of breath.   Cardiovascular: Negative for chest pain.  Gastrointestinal: Positive for nausea. Negative for abdominal distention.  Genitourinary: Negative for dysuria.  Musculoskeletal: Negative for back pain.  Skin: Negative for rash.  Neurological:  Negative for headaches.  All other systems reviewed and are negative.    Allergies  Codeine and Penicillins  Home Medications   Current Outpatient Rx  Name Route Sig Dispense Refill  . ASPIRIN 81 MG PO CHEW Oral Chew 81 mg by mouth daily.      . B COMPLEX PO TABS Oral Take 1 tablet by mouth daily.      Marland Kitchen BISOPROLOL FUMARATE 5 MG PO TABS Oral Take 5 mg by mouth daily.      Marland Kitchen CARBIDOPA-LEVODOPA ER 50-200 MG PO TBCR Oral Take 1 tablet by mouth 2 (two) times daily.      . FUROSEMIDE 40 MG PO TABS Oral Take 40 mg by mouth daily.      Marland Kitchen MAGNESIUM OXIDE 400 MG PO TABS Oral Take 400 mg by mouth daily.      Marland Kitchen POTASSIUM CHLORIDE CRYS ER 20 MEQ PO TBCR Oral Take 20 mEq by mouth daily.        BP 110/51  Pulse 72  Temp(Src) 98 F (36.7 C) (Oral)  Resp 20  SpO2 96%  Physical Exam  Constitutional: She is oriented to person, place, and time. She appears well-developed and well-nourished.  HENT:  Head: Normocephalic and atraumatic.       She does have some residual dried blood around her mouth. No active emesis  Eyes: Conjunctivae and EOM are normal. Pupils are equal, round,  and reactive to light.  Neck: Trachea normal. Neck supple. No thyromegaly present.  Cardiovascular: Normal rate, regular rhythm, S1 normal, S2 normal and normal pulses.     No systolic murmur is present   No diastolic murmur is present  Pulses:      Radial pulses are 2+ on the right side, and 2+ on the left side.  Pulmonary/Chest: Effort normal and breath sounds normal. She has no wheezes. She has no rhonchi. She has no rales. She exhibits no tenderness.  Abdominal: Soft. Normal appearance and bowel sounds are normal. She exhibits no mass. There is no tenderness. There is no rebound, no guarding, no CVA tenderness and negative Murphy's sign.  Musculoskeletal:       BLE:s Calves nontender, no cords or erythema, negative Homans sign  Neurological: She is alert and oriented to person, place, and time. She has normal  strength. No cranial nerve deficit or sensory deficit. GCS eye subscore is 4. GCS verbal subscore is 5. GCS motor subscore is 6.  Skin: Skin is warm and dry. No rash noted. She is not diaphoretic.  Psychiatric: Her speech is normal.       Cooperative and appropriate    ED Course  Procedures (including critical care time)  Labs Reviewed  CBC - Abnormal; Notable for the following:    WBC 11.4 (*)    RBC 3.66 (*)    Hemoglobin 9.9 (*)    HCT 31.7 (*)    All other components within normal limits  DIFFERENTIAL - Abnormal; Notable for the following:    Neutrophils Relative 84 (*)    Neutro Abs 9.6 (*)    Lymphocytes Relative 8 (*)    All other components within normal limits  COMPREHENSIVE METABOLIC PANEL - Abnormal; Notable for the following:    Glucose, Bld 143 (*)    Albumin 3.0 (*)    Total Bilirubin 0.2 (*)    GFR calc non Af Amer 66 (*)    GFR calc Af Amer 77 (*)    All other components within normal limits  TYPE AND SCREEN  POCT OCCULT BLOOD STOOL, DEVICE   Dg Chest Port 1 View  09/26/2011  *RADIOLOGY REPORT*  Clinical Data: Vomiting.  History of hypertension and Parkinson's disease.  PORTABLE CHEST - 1 VIEW  Comparison: 07/12/2009 and 09/23/2007.  Findings: 0022 hours.  The heart size and mediastinal contours are stable.  There is new patchy retrocardiac opacity associated with blunting of the left costophrenic angle.  The right lung is clear. There is no edema or pneumothorax.  Telemetry leads overlie the chest.  IMPRESSION: New left lower lobe air space disease with probable adjacent small left pleural effusion.  Findings could reflect atelectasis; aspiration cannot be excluded.  Original Report Authenticated By: Gerrianne Scale, M.D.    Date: 09/26/2011  Rate: 71  Rhythm: normal sinus rhythm  QRS Axis: normal  Intervals: normal  ST/T Wave abnormalities: nonspecific ST changes  Conduction Disutrbances:none  Narrative Interpretation:   Old EKG Reviewed: none  available    CRITICAL CARE Performed by: Fidelis Loth   Total critical care time: 35  Critical care time was exclusive of separately billable procedures and treating other patients.  Critical care was necessary to treat or prevent imminent or life-threatening deterioration.  Critical care was time spent personally by me on the following activities: development of treatment plan with patient and/or surrogate as well as nursing, discussions with consultants, evaluation of patient's response to treatment, examination of patient, obtaining  history from patient.   IV blood products initiated in the emergency department, room air pulse ox 97% is adequate, ordering and performing treatments and interventions, ordering and review of laboratory studies, ordering and review of radiographic studies, pulse oximetry and re-evaluation of patient's condition.  1. GI bleed    IV fluids. Type and screen. Labs obtained and reviewed as above. Initial blood pressure systolic 110 and on recheck in the 90s. Medicine consultation as above. Dr. August Luz evaluated bedside and plans admission to step down. GI involved. Chest x-ray reviewed and patient does not have fever or cough or respiratory complaints. Admitting physician aware and agrees to address. IV Protonix. IV Zofran.   MDM   GI bleed an elderly female initially normotensive now is low blood pressure. Admitted to step down GI involved, medical admission.        Sunnie Nielsen, MD 09/26/11 [id number]  Sunnie Nielsen, MD 09/26/11 0230

## 2011-09-26 NOTE — Op Note (Signed)
[facility] [address] [place], [place]  [address]  ENDOSCOPY PROCEDURE REPORT  PATIENT:  [name], [name]  MR#:  [id number] BIRTHDATE:  May 15, 1941, 70 yrs. old  GENDER:  female ENDOSCOPIST:  [name] Caddy. Amirr Achord, MD Referred by:  [facility], PROCEDURE DATE:  09/26/2011 PROCEDURE:  EGD with Submucosal Injection, EGD for control of bleeding ASA CLASS:  Class III INDICATIONS:  hematemesis, melena MEDICATIONS:   Fentanyl 25 mcg IV, Versed 3 mg IV TOPICAL ANESTHETIC:  Cetacaine Spray  DESCRIPTION OF PROCEDURE:   After the risks benefits and alternatives of the procedure were thoroughly explained, informed consent was obtained.  The Pentax Gastroscope [id number] endoscope was introduced through the mouth and advanced to the second portion of the duodenum, without limitations.  The instrument was slowly withdrawn as the mucosa was fully examined. <<PROCEDUREIMAGES>>  Severe, LA Grade D, erosive esophagitis was found in the middle and distal esophagus.  There was an ulcer, not bleeding and superficial in the distal esophagus..  An ulcer was found on the lesser curve in the proximal gastric body.  There was a visible vessel and injection was performed with epinephrine 1:10,000 (4 total cc), then 2 hemostatic clips were successfully placed.  No bleeding at the end of the procedure.  A large hiatal hernia was found.  The duodenal bulb was normal in appearance, as was the postbulbar duodenum.    Retroflexed views revealed  findings as previously described.    The scope was then withdrawn from the patient and the procedure completed.  COMPLICATIONS:  None  ENDOSCOPIC IMPRESSION: 1) Severe likely reflux erosive esophagitis 2) Ulcer on lesser curvature of the stomach, felt to be acute bleeding source. Successfully injected with epinephrine and clipped with 2 hemostatic clips. 3) Hiatal hernia 4) Normal duodenum  RECOMMENDATIONS: 1) Continue PPI infusion for 72 hours,  then switch to po BID PPI for at least 3 months. 2) Follow Hgb closely and transfuse as needed to maintain Hgb > 8 3) Check H. Pylori serum Ab and treat if positive 4) Avoid NSAIDs 5) Repeat EGD in 12 weeks to ensure healing of esophagitis and ulcer.  [name] Caddy. [name] Belton, MD  n. Rosalie DoctorCarie Caddy. [name] at 09/26/2011 08:25 PM  Letitia Libra, [phone]

## 2011-09-26 NOTE — Consult Note (Signed)
Patient seen, examined and I agree with the above documentation, including the assessment and plan. Improved hemodynamics after further resuscitation with blood and crystalloid. On ppi gtt EGD today Further recs thereafter

## 2011-09-26 NOTE — Procedures (Signed)
Central Venous Catheter Insertion Procedure Note [name] [id number] 10-Jan-1941  Procedure: Insertion of Central Venous Catheter Indications: Drug and/or fluid administration  Procedure Details Consent: Risks of procedure as well as the alternatives and risks of each were explained to the (patient/caregiver).  Consent for procedure obtained. Time Out: Verified patient identification, verified procedure, site/side was marked, verified correct patient position, special equipment/implants available, medications/allergies/relevent history reviewed, required imaging and test results available.  Performed  Maximum sterile technique was used including antiseptics, cap, gloves, gown, hand hygiene, mask and sheet. Skin prep: Chlorhexidine; local anesthetic administered A antimicrobial bonded/coated triple lumen catheter was placed in the right internal jugular vein using the Seldinger technique.  Evaluation Blood flow good Complications: No apparent complications Patient did tolerate procedure well. Chest X-ray ordered to verify placement.  CXR: pending.  [name] 09/26/2011, 11:34 AM  I supervised the procedure. No complications, CXR pending Levy Pupa, MD, PhD 09/26/2011, 11:43 AM [facility] [phone] or if no answer [phone]

## 2011-09-26 NOTE — Consult Note (Signed)
[facility] Consultation  Referring Provider: [facility] Primary Care Physician:  No primary provider on file. Primary Gastroenterologist:   None Reason for Consultation:  GI Bleed  HPI: [name] is a 71 y.o. female who presented to ED last night after two episodes of hematemesis around 9pm. Both times emesis was bright red. Patient hadn't felt fell for a couple of days but not nausea or abdominal pain. She hasn't had any rectal bleeding at home. At 6am however, patient passed maroon stool per nurse. She was normotensive in ER but SBP now in 80's. Patient takes a baby asa, no other NSAIDS. No history of PUD. She had a colonoscopy "many moons ago".   Past Medical History  Diagnosis Date  . Parkinson's disease   . Hypertension   . Arthritis   . Anemia     Past Surgical History  Procedure Date  . Hip arthroscopy w/ labral repair   . Thyroidectomy     Prior to Admission medications   Medication Sig Start Date End Date Taking? Authorizing Provider  aspirin 81 MG chewable tablet Chew 81 mg by mouth daily.     Yes Historical Provider, MD  b complex vitamins tablet Take 1 tablet by mouth daily.     Yes Historical Provider, MD  bisoprolol (ZEBETA) 5 MG tablet Take 5 mg by mouth daily.     Yes Historical Provider, MD  carbidopa-levodopa (SINEMET CR) 50-200 MG per tablet Take 1 tablet by mouth 2 (two) times daily.     Yes Historical Provider, MD  furosemide (LASIX) 40 MG tablet Take 40 mg by mouth daily.     Yes Historical Provider, MD  magnesium oxide (MAG-OX) 400 MG tablet Take 400 mg by mouth daily.     Yes Historical Provider, MD  potassium chloride SA (K-DUR,KLOR-CON) 20 MEQ tablet Take 20 mEq by mouth daily.     Yes Historical Provider, MD    Current Facility-Administered Medications  Medication Dose Route Frequency Provider Last Rate Last Dose  . 0.9 %  sodium chloride infusion   Intravenous Continuous Lawal Garba 200 mL/hr at 09/26/11 0819    . ondansetron  (ZOFRAN) injection 4 mg  4 mg Intravenous Once Sunnie Nielsen, MD   4 mg at 09/25/11 2350  . ondansetron (ZOFRAN) injection 4 mg  4 mg Intravenous Q6H PRN Lawal Garba      . pantoprazole (PROTONIX) 80 mg in sodium chloride 0.9 % 250 mL infusion  8 mg/hr Intravenous Continuous Lawal Garba 25 mL/hr at 09/26/11 0650 8 mg/hr at 09/26/11 0650  . pantoprazole (PROTONIX) injection 40 mg  40 mg Intravenous Once Sunnie Nielsen, MD   40 mg at 09/25/11 2352  . sodium chloride 0.9 % bolus 1,000 mL  1,000 mL Intravenous Once Sunnie Nielsen, MD   1,000 mL at 09/25/11 2345  . DISCONTD: 0.9 %  sodium chloride infusion   Intravenous Continuous Doug Sou, MD        Allergies as of 09/25/2011 - Review Complete 09/25/2011  Allergen Reaction Noted  . Codeine Other (See Comments) 09/25/2011  . Penicillins Other (See Comments) 09/25/2011    Family History  Problem Relation Age of Onset  . GER disease Mother     History   Social History  . Marital Status: Single    Spouse Name: N/A    Number of Children: N/A  . Years of Education: N/A    Social History Main Topics  . Smoking status: Never Smoker   . Smokeless  tobacco: Not on file  . Alcohol Use: No  . Drug Use: No  . Sexually Active: No   Review of Systems: Two day history of not feeling well but no specific complaints. ROS negative except where noted in HPI.  PHYSICAL EXAM: Vital signs in last 24 hours: Temp:  [98 F (36.7 C)-98.9 F (37.2 C)] 98.7 F (37.1 C) (01/03 1022) Pulse Rate:  [67-94] 74  (01/03 1022) Resp:  [11-20] 15  (01/03 1022) BP: (76-110)/(30-82) 88/38 mmHg (01/03 1022) SpO2:  [96 %-99 %] 96 % (01/03 0859) Weight:  [85.6 kg (188 lb 11.4 oz)] 188 lb 11.4 oz (85.6 kg) (01/03 0313) Last BM Date: 09/26/11 General:  Well-developed white female in NAD Head:  Normocephalic and atraumatic. Eyes:   No icterus.   Conjunctiva pale. Ears:  Normal auditory acuity. Neck:  Supple; no masses felt Lungs:  Respirations even and unlabored.  Lungs clear to auscultation bilaterlly.   No wheezes, crackles, or rhonchi.  Heart:  Hypotensive, regular rate and rhythm; no murmurs heard. Abdomen:  Soft, nondistended, nontender. Normal bowel sounds. No appreciable masses or hepatomegaly.  Rectal:  Bright red blood in vault.  Msk:  Symmetrical without gross deformities.  Extremities:  Without edema. Neurologic:  Slightly drowsy but oriented.  Skin:  Intact without significant lesions or rashes. Cervical Nodes:  No significant cervical adenopathy. Psych:  Cooperative. Normal mood and affect.  LAB RESULTS:  Basename 09/26/11 0455 09/25/11 2243  WBC 9.0 11.4*  HGB 7.4* 9.9*  HCT 23.8* 31.7*  PLT 193 221    09/23/2007 19:47 09/24/2007 10:00 09/25/2007 05:55 07/12/2009 10:11 09/25/2011 22:43 09/26/2011 04:55  HGB 14.6 10.6 DELTA CHECK NOTED (L) 9.7 (L) 14.7 9.9 (L) 7.4 (L)   BMET  Basename 09/25/11 2243  NA 139  K 4.8  CL 105  CO2 28  GLUCOSE 143*  BUN 21  CREATININE 0.86  CALCIUM 8.9   LFT  Basename 09/25/11 2243  PROT 6.0  ALBUMIN 3.0*  AST 21  ALT 8  ALKPHOS 68  BILITOT 0.2*  BILIDIR --  IBILI --   STUDIES: Dg Chest Port 1 View  09/26/2011  *RADIOLOGY REPORT*  Clinical Data: Vomiting.  History of hypertension and Parkinson's disease.  PORTABLE CHEST - 1 VIEW  Comparison: 07/12/2009 and 09/23/2007.  Findings: 0022 hours.  The heart size and mediastinal contours are stable.  There is new patchy retrocardiac opacity associated with blunting of the left costophrenic angle.  The right lung is clear. There is no edema or pneumothorax.  Telemetry leads overlie the chest.  IMPRESSION: New left lower lobe air space disease with probable adjacent small left pleural effusion.  Findings could reflect atelectasis; aspiration cannot be excluded.  Original Report Authenticated By: Gerrianne Scale, M.D.   PREVIOUS ENDOSCOPIES: None in at least 10 years - sounds like she had a colonoscopy more than 10 years ago.  IMPRESSION / PLAN: 56.  71 year old white female white acute GIB manifested by hematemesis and now hematochezia. Her BUN is normal.  SBP is in upper 80s, heartrate normal (she takes a beta blocker). No preceding abdominal pain or nausea. Rule out PUD, dieulafoy lesion, AVMs, etc... Patient needs EGD at bedside but unable to sedate with pressure in the 80's. She has her first unit of PRBCs going. Critical Care will be placing central line, patient may need more volume and / or pressors prior to EGD. The benefits, risks, and potential complications of EGD with possible biopsies were discussed with the  patient and she agrees to proceed. Continue Protonix drip, supportive care. 2.  Anemia of acute blood loss secondary to #1. Patient's hemoglobin was over 14 in 2010, 9.9 in yesterday and now down to 7.4. Blood transfusion in progress.   3. Parkinson's Disease, on Sinemet 4. HTN, on diuretic and beta blocker at home  thanks   LOS: 1 day   Willette Cluster  09/26/2011, 10:40 AM

## 2011-09-26 NOTE — Progress Notes (Signed)
BP improved during blood transfusion. We will recheck Hgb 2 hrs after transfusion complete. Keep above 8.0. Monitor for fluid overload.

## 2011-09-27 DIAGNOSIS — K254 Chronic or unspecified gastric ulcer with hemorrhage: Principal | ICD-10-CM

## 2011-09-27 DIAGNOSIS — K209 Esophagitis, unspecified without bleeding: Secondary | ICD-10-CM | POA: Diagnosis present

## 2011-09-27 DIAGNOSIS — K259 Gastric ulcer, unspecified as acute or chronic, without hemorrhage or perforation: Secondary | ICD-10-CM | POA: Diagnosis present

## 2011-09-27 LAB — BASIC METABOLIC PANEL
BUN: 17 mg/dL (ref 6–23)
Chloride: 110 mEq/L (ref 96–112)
GFR calc Af Amer: >90 mL/min (ref >90)
Glucose, Bld: 99 mg/dL (ref 70–99)
Potassium: 3.9 mEq/L (ref 3.5–5.1)
Sodium: 140 mEq/L (ref 135–145)

## 2011-09-27 LAB — TYPE AND SCREEN

## 2011-09-27 LAB — HEMOGLOBIN AND HEMATOCRIT, BLOOD
HCT: 27.4 % — ABNORMAL LOW (ref 36.0–46.0)
HCT: 28.1 % — ABNORMAL LOW (ref 36.0–46.0)
Hemoglobin: 9 g/dL — ABNORMAL LOW (ref 12.0–15.0)
Hemoglobin: 8.5 g/dL — ABNORMAL LOW (ref 12.0–15.0)

## 2011-09-27 MED ORDER — CARBIDOPA-LEVODOPA ER 50-200 MG PO TBCR
1.0000 | EXTENDED_RELEASE_TABLET | Freq: Two times a day (BID) | ORAL | Status: DC
  Filled 2011-09-27 (×2): qty 1

## 2011-09-27 MED ORDER — PRAMIPEXOLE DIHYDROCHLORIDE 1.5 MG PO TABS
1.5000 mg | ORAL_TABLET | Freq: Three times a day (TID) | ORAL | Status: DC
  Administered 2011-09-27 – 2011-09-28 (×3): 1.5 mg via ORAL
  Filled 2011-09-27 (×7): qty 1

## 2011-09-27 MED ORDER — CARBIDOPA-LEVODOPA CR 25-100 MG PO TBCR
2.0000 | EXTENDED_RELEASE_TABLET | Freq: Three times a day (TID) | ORAL | Status: DC
  Administered 2011-09-27 – 2011-09-29 (×8): 2 via ORAL
  Filled 2011-09-27 (×12): qty 2

## 2011-09-27 MED ORDER — LORAZEPAM 2 MG/ML IJ SOLN
INTRAMUSCULAR | Status: AC
  Filled 2011-09-27: qty 1

## 2011-09-27 MED ORDER — LORAZEPAM 2 MG/ML IJ SOLN
0.5000 mg | INTRAMUSCULAR | Status: AC
  Administered 2011-09-27: 0.5 mg via INTRAVENOUS

## 2011-09-27 MED ORDER — CARBIDOPA-LEVODOPA ER 50-200 MG PO TBCR
1.0000 | EXTENDED_RELEASE_TABLET | Freq: Two times a day (BID) | ORAL | Status: DC
  Administered 2011-09-27: 1 via ORAL
  Filled 2011-09-27 (×3): qty 1

## 2011-09-27 MED ORDER — B COMPLEX-C PO TABS
1.0000 | ORAL_TABLET | Freq: Every day | ORAL | Status: DC
  Administered 2011-09-27 – 2011-09-30 (×4): 1 via ORAL
  Filled 2011-09-27 (×4): qty 1

## 2011-09-27 MED ORDER — CARBIDOPA-LEVODOPA ER 50-200 MG PO TBCR
1.0000 | EXTENDED_RELEASE_TABLET | Freq: Every day | ORAL | Status: DC
  Administered 2011-09-28 – 2011-09-29 (×2): 1 via ORAL
  Filled 2011-09-27 (×4): qty 1

## 2011-09-27 MED ORDER — MAGNESIUM OXIDE 400 MG PO TABS
400.0000 mg | ORAL_TABLET | Freq: Every day | ORAL | Status: DC
  Administered 2011-09-27 – 2011-09-30 (×4): 400 mg via ORAL
  Filled 2011-09-27 (×4): qty 1

## 2011-09-27 MED ORDER — B COMPLEX PO TABS: 1.0000 | ORAL_TABLET | Freq: Every day | ORAL | Status: DC

## 2011-09-27 NOTE — Progress Notes (Signed)
Patient seen and I agree with the above documentation, including the assessment and plan. Most recent Hgb stable at 9.0.  Pt denies pain Cont ppi gtt x 72 hours Monitor CBC Repeat EGD in 12 weeks.

## 2011-09-27 NOTE — Progress Notes (Signed)
[facility] Progress Note  SUBJECTIVE: She feels okay. She has had some smears of melanotic stools today.   OBJECTIVE:  Vital signs in last 24 hours: Temp:  [97.2 F (36.2 C)-99 F (37.2 C)] 97.2 F (36.2 C) (01/04 1201) Pulse Rate:  [74-99] 99  (01/04 1201) Resp:  [13-23] 17  (01/04 1201) BP: (64-129)/(34-96) 99/58 mmHg (01/04 1201) SpO2:  [91 %-100 %] 93 % (01/04 1201) Weight:  [87.6 kg (193 lb 2 oz)] 193 lb 2 oz (87.6 kg) (01/04 0022) Last BM Date: 09/26/11 General:    white female in NAD Heart:  Regular rate and rhythm Lungs: Respirations even and unlabored, lungs CTA bilaterally Abdomen:  Soft, nontender and nondistended. Normal bowel sounds. Psych:   Normal mood and affect.  Lab Results:  Basename 09/27/11 0640 09/26/11 2210 09/26/11 1733 09/26/11 0455 09/25/11 2243  WBC -- -- 11.1* 9.0 11.4*  HGB 8.8* 9.0* 9.4* -- --  HCT 27.4* 27.6* 28.6* -- --  PLT -- -- 159 193 221   BMET  Basename 09/27/11 0640 09/25/11 2243  NA 140 139  K 3.9 4.8  CL 110 105  CO2 25 28  GLUCOSE 99 143*  BUN 17 21  CREATININE 0.77 0.86  CALCIUM 8.0* 8.9   LFT  Basename 09/25/11 2243  PROT 6.0  ALBUMIN 3.0*  AST 21  ALT 8  ALKPHOS 68  BILITOT 0.2*  BILIDIR --  IBILI --   Studies/Results: Dg Chest Port 1 View  09/26/2011  *RADIOLOGY REPORT*  Clinical Data: Central line placement.  PORTABLE CHEST - 1 VIEW  Comparison: Chest x-ray 09/26/2011.  Findings: The cardiac silhouette, mediastinal and hilar contours are stable.  Persistent mild vascular congestion.  The right IJ central venous catheter tip is near the cavoatrial junction.  No complicating features such as a pneumothorax.  IMPRESSION: Right IJ central venous catheter tip is near the cavoatrial junction.  No pneumothorax.  Original Report Authenticated By: P. Loralie Champagne, M.D.   Dg Chest Port 1 View  09/26/2011  *RADIOLOGY REPORT*  Clinical Data: Vomiting.  History of hypertension and Parkinson's disease.   PORTABLE CHEST - 1 VIEW  Comparison: 07/12/2009 and 09/23/2007.  Findings: 0022 hours.  The heart size and mediastinal contours are stable.  There is new patchy retrocardiac opacity associated with blunting of the left costophrenic angle.  The right lung is clear. There is no edema or pneumothorax.  Telemetry leads overlie the chest.  IMPRESSION: New left lower lobe air space disease with probable adjacent small left pleural effusion.  Findings could reflect atelectasis; aspiration cannot be excluded.  Original Report Authenticated By: Gerrianne Scale, M.D.   ASSESSMENT / PLAN: 35.  71 year old female with UGI bleed. EGD with epinephrine injection and  endoclipping of lesser curvature ulcer yesterday. Patient has had some "smears" of old blood per nurse. Hemodynamically stable. Her hemoglobin has been overall stable as of last check. It went from 9.0 at 10pm to 8.8, she is due for another now. Continue PPI drip for now.  2.  Anemia of acute blood loss secondary to #1. Continue to monitor, hemoglobin has dipped slightly since transfusion but hopefully just equilibration.  3. Parkinson's Disease.    LOS: 2 days   Willette Cluster  09/27/2011, 1:48 PM

## 2011-09-27 NOTE — Progress Notes (Signed)
[facility]   Subjective: No further hematemesis. Still passing a room cold melanotic stool. Main problem today is his time movement related to her Parkinson's disease which is originating from lack of usual medications. She denies shortness of breath or chest pain or dizziness.  Objective: Vital signs in last 24 hours: Temp:  [97.7 F (36.5 C)-99 F (37.2 C)] 97.7 F (36.5 C) (01/04 0743) Pulse Rate:  [74-98] 81  (01/04 0800) Resp:  [13-23] 19  (01/04 0800) BP: (64-129)/(34-96) 121/64 mmHg (01/04 0743) SpO2:  [91 %-100 %] 96 % (01/04 0800) Weight:  [87.6 kg (193 lb 2 oz)] 193 lb 2 oz (87.6 kg) (01/04 0022) Weight change: 2 kg (4 lb 6.6 oz) Last BM Date: 09/26/11  Intake/Output from previous day: 01/03 0701 - 01/04 0700 In: 3850 [I.V.:3225; Blood:625] Out: 3100 [Urine:3100] Intake/Output this shift: Total I/O In: 700 [P.O.:400; I.V.:300] Out: 1450 [Urine:1450]  General appearance: alert, cooperative, appears stated age and no distress Resp: clear to auscultation bilaterally, maintaining room-air saturations of 96% Cardio: regular rate and rhythm, S1, S2 normal, no murmur, click, rub or gallop, IV fluids at 125 cc per hour GI: soft, non-tender; bowel sounds normal; no masses,  no organomegaly, tolerating clear liquid diet, protonic strip and using a 25 cc per hour Extremities: extremities normal, atraumatic, no cyanosis or edema Neurologic: Grossly normal, alert and oriented and has dystonic and uncontrolled movements related to exacerbation of Parkinson's disease. Patient endorses at home but does not take medications at usual prescribed time these movements will occur.  Lab Results:  Basename 09/27/11 0640 09/26/11 2210 09/26/11 1733 09/26/11 0455  WBC -- -- 11.1* 9.0  HGB 8.8* 9.0* -- --  HCT 27.4* 27.6* -- --  PLT -- -- 159 193   BMET  Basename 09/27/11 0640 09/25/11 2243  NA 140 139  K 3.9 4.8  CL 110 105  CO2 25 28  GLUCOSE 99 143*  BUN 17 21    CREATININE 0.77 0.86  CALCIUM 8.0* 8.9    Studies/Results: Dg Chest Port 1 View  09/26/2011  *RADIOLOGY REPORT*  Clinical Data: Central line placement.  PORTABLE CHEST - 1 VIEW  Comparison: Chest x-ray 09/26/2011.  Findings: The cardiac silhouette, mediastinal and hilar contours are stable.  Persistent mild vascular congestion.  The right IJ central venous catheter tip is near the cavoatrial junction.  No complicating features such as a pneumothorax.  IMPRESSION: Right IJ central venous catheter tip is near the cavoatrial junction.  No pneumothorax.  Original Report Authenticated By: P. Loralie Champagne, M.D.   Dg Chest Port 1 View  09/26/2011  *RADIOLOGY REPORT*  Clinical Data: Vomiting.  History of hypertension and Parkinson's disease.  PORTABLE CHEST - 1 VIEW  Comparison: 07/12/2009 and 09/23/2007.  Findings: 0022 hours.  The heart size and mediastinal contours are stable.  There is new patchy retrocardiac opacity associated with blunting of the left costophrenic angle.  The right lung is clear. There is no edema or pneumothorax.  Telemetry leads overlie the chest.  IMPRESSION: New left lower lobe air space disease with probable adjacent small left pleural effusion.  Findings could reflect atelectasis; aspiration cannot be excluded.  Original Report Authenticated By: Gerrianne Scale, M.D.    Medications:  I have reviewed the patient's current medications. Scheduled:   . B-complex with vitamin C  1 tablet Oral Daily  . carbidopa-levodopa  1 tablet Oral Once  . carbidopa-levodopa  1 tablet Oral BID  . LORazepam      .  LORazepam  0.5 mg Intravenous NOW  . magnesium oxide  400 mg Oral Daily  . DISCONTD: sodium chloride   Intravenous Once  . DISCONTD: b complex vitamins  1 tablet Oral Daily  . DISCONTD: carbidopa-levodopa  1 tablet Oral BID    Assessment/Plan:  Principal Problem:  *Hematemesis secondary to GI bleed:Gastric ulcer with hemorrhage and Esophagitis, erosive Hematemesis has  resolved but persistent melanotic stool. GI following. Continue Protonix infusion and transitioning to oral medication will be deferred to a structural to service. Continue clear liquid diet-GI will also advance at their discretion. Would continue to hold aspirin products.  Active Problems:  Acute blood loss anemia Hemoglobin had increased  from 7.4 to a peak of 9.3 after 2 units of packed red blood cells yesterday. Today there is a subtle drift downward to 8.8. We will continue serial CBC every 8 hours.   Hypotension  RESOLVED etiology felt to be primarily due to acute blood loss anemia which is symptomatic.   Leucocytosis Continues with subtle leukocytosis without fever likely reactive in nature due to acute GI bleeding.   Parkinson's disease We'll resume home Sinemet dosage.   HTN (hypertension) Although frank hypotension has resolved her blood pressure remains solved in the 104-120 range and therefore usual home antihypertensive medications have been placed on hold.   Disposition Remain in the step down ICU until hemoglobin is more stable.    LOS: 2 days   Junious Silk, ANP pager [phone]  [facility] 8 [email or web address] Password: [email or web address]  09/27/2011, 11:24 AM

## 2011-09-27 NOTE — Progress Notes (Signed)
NP called to [facility] by RN. Pt with jerking motions of arms and face. She is awake and alert through the incident. Pt has been without her Sinemet for a few doses. Sinemet restarted tonight before incident. Ativan given IV and her sx improved greatly. I do not think this is seizure activity given the pt's hx of Parkinsons with missed doses of meds and the fact that she is alert and appropriate in actions and speech throughout incident. Will follow.  Maren Reamer, NP [facility]

## 2011-09-27 NOTE — Progress Notes (Signed)
Utilization Review Completed.[name] T1/12/2011   

## 2011-09-27 NOTE — Progress Notes (Signed)
I have examined the patient and reviewed the chart. She does continue to have dark stool - per nursing they are maroon. Will cont to follow in step down.  I agree with above outlined plan.   Calvert Cantor MD [phone]

## 2011-09-28 ENCOUNTER — Other Ambulatory Visit: Payer: Self-pay

## 2011-09-28 LAB — CBC
MCH: 27.7 pg (ref 26.0–34.0)
MCHC: 32.4 g/dL (ref 30.0–36.0)
MCV: 85.5 fL (ref 78.0–100.0)
Platelets: 152 10*3/uL (ref 150–400)
RBC: 3.18 MIL/uL — ABNORMAL LOW (ref 3.87–5.11)
RDW: 15.1 % (ref 11.5–15.5)

## 2011-09-28 LAB — CARDIAC PANEL(CRET KIN+CKTOT+MB+TROPI)
Relative Index: 2.7 — ABNORMAL HIGH (ref 0.0–2.5)
Relative Index: 2.9 — ABNORMAL HIGH (ref 0.0–2.5)
Total CK: 213 U/L — ABNORMAL HIGH (ref 7–177)
Troponin I: <0.3 ng/mL (ref <0.30)

## 2011-09-28 MED ORDER — NITROGLYCERIN 0.4 MG SL SUBL
SUBLINGUAL_TABLET | SUBLINGUAL | Status: AC
  Administered 2011-09-28: 0.4 mg
  Filled 2011-09-28: qty 25

## 2011-09-28 MED ORDER — BISOPROLOL FUMARATE 5 MG PO TABS
5.0000 mg | ORAL_TABLET | Freq: Every day | ORAL | Status: DC
  Administered 2011-09-29: 5 mg via ORAL
  Filled 2011-09-28: qty 1

## 2011-09-28 MED ORDER — PRAMIPEXOLE DIHYDROCHLORIDE 1.5 MG PO TABS
1.5000 mg | ORAL_TABLET | ORAL | Status: DC
  Administered 2011-09-29 – 2011-09-30 (×6): 1.5 mg via ORAL
  Filled 2011-09-28 (×7): qty 1

## 2011-09-28 MED ORDER — SUCRALFATE 1 GM/10ML PO SUSP
1.0000 g | Freq: Four times a day (QID) | ORAL | Status: DC
  Administered 2011-09-28 – 2011-09-30 (×7): 1 g via ORAL
  Filled 2011-09-28 (×12): qty 10

## 2011-09-28 MED ORDER — PANTOPRAZOLE SODIUM 40 MG PO TBEC
40.0000 mg | DELAYED_RELEASE_TABLET | Freq: Two times a day (BID) | ORAL | Status: DC
  Administered 2011-09-29 – 2011-09-30 (×3): 40 mg via ORAL
  Filled 2011-09-28 (×2): qty 1

## 2011-09-28 MED ORDER — NITROGLYCERIN 0.4 MG SL SUBL
0.4000 mg | SUBLINGUAL_TABLET | SUBLINGUAL | Status: DC | PRN
  Administered 2011-09-28 (×2): 0.4 mg via SUBLINGUAL
  Filled 2011-09-28 (×2): qty 25

## 2011-09-28 MED ORDER — LORAZEPAM 0.5 MG PO TABS
0.5000 mg | ORAL_TABLET | Freq: Once | ORAL | Status: AC
  Administered 2011-09-28: 0.5 mg via ORAL
  Filled 2011-09-28: qty 1

## 2011-09-28 MED ORDER — WHITE PETROLATUM GEL
Status: AC
  Administered 2011-09-28: 06:00:00
  Filled 2011-09-28: qty 5

## 2011-09-28 NOTE — Progress Notes (Signed)
1530  Transferred via w/c from 2600 r/a with good 02 sats, pt sob with very little exertion, alert, oriented to unit.  Pt notified her brother who is her emergency contact of her room change.  Pt is full code, has chronic back pain since a hip operation about 5 years ago she states, but no pain at this time.  IV located lt wrist area, fluids resumed @ 50cc/hr.  Pt is non telemetry, has uncontrollable muscular shakes, states has parkinsons disease, states uses walker when oob, SCD's for VTE.  Will continue pt on full liquids for now and advance for breakfast in am, specifying soft foods.  Bonney Leitz RN

## 2011-09-28 NOTE — Progress Notes (Addendum)
[facility] Progress Note  Subjective: Pt reports chest pain this am.  Nonradiating.  Got NTG SL x 3 which dropped her BP.  Otherwise she feels okay.  No further melena this am.  No hematochezia.  Nurse thinks stools ar turning more brown in color as opposed to maroon (Like yesterday).  No fevers.  Objective:  Vital signs in last 24 hours: Temp:  [97.2 F (36.2 C)-98.8 F (37.1 C)] 97.8 F (36.6 C) (01/05 0800) Pulse Rate:  [79-99] 79  (01/05 0500) Resp:  [16-20] 20  (01/05 0800) BP: (95-128)/(37-105) 109/58 mmHg (01/05 0800) SpO2:  [93 %-98 %] 98 % (01/05 0500) Weight:  [188 lb 7.9 oz (85.5 kg)] 188 lb 7.9 oz (85.5 kg) (01/05 0007) Last BM Date: 09/27/11 General:   Alert,  Sitting in chair NAD Heart:  Regular rate and rhythm Abdomen:  Soft, nontender and nondistended. Normal bowel sounds, without guarding, and without rebound.   Extremities:  Without edema. Neurologic:  Resting tremor some better today Psych:  Alert and cooperative. Normal mood and affect.  Intake/Output from previous day: 01/04 0701 - 01/05 0700 In: 3445 [P.O.:920; I.V.:2525] Out: 4901 [Urine:4900; Stool:1] Intake/Output this shift: Total I/O In: -  Out: 200 [Urine:200]  Lab Results:  Basename 09/28/11 0600 09/27/11 2158 09/27/11 1419 09/26/11 1733 09/26/11 0455  WBC 8.2 -- -- 11.1* 9.0  HGB 8.8* 8.5* 9.0* -- --  HCT 27.2* 26.5* 28.1* -- --  PLT 152 -- -- 159 193   BMET  Basename 09/27/11 0640 09/25/11 2243  NA 140 139  K 3.9 4.8  CL 110 105  CO2 25 28  GLUCOSE 99 143*  BUN 17 21  CREATININE 0.77 0.86  CALCIUM 8.0* 8.9   LFT  Basename 09/25/11 2243  PROT 6.0  ALBUMIN 3.0*  AST 21  ALT 8  ALKPHOS 68  BILITOT 0.2*  BILIDIR --  IBILI --   Studies/Results: Dg Chest Port 1 View  09/26/2011  *RADIOLOGY REPORT*  Clinical Data: Central line placement.  PORTABLE CHEST - 1 VIEW  Comparison: Chest x-ray 09/26/2011.  Findings: The cardiac silhouette, mediastinal and hilar contours  are stable.  Persistent mild vascular congestion.  The right IJ central venous catheter tip is near the cavoatrial junction.  No complicating features such as a pneumothorax.  IMPRESSION: Right IJ central venous catheter tip is near the cavoatrial junction.  No pneumothorax.  Original Report Authenticated By: P. Loralie Champagne, M.D.    Assessment / Plan: 71 yo female with Parkinson's admitted with acute UGI bleeding found to be secondary to gastric ulcer and severe erosive/ulcerative esophagitis.  1. UGI bleeding - she has been maintained on PPI.  Plan is for 72 hours total after endoscopic therapy.  Then she will need to be transitioned to BID therapy.  Hgb is stable, though it did drift down slightly from 9.0 --> 8.5, but now back to 8.8.  With this in mind, I do not think she needs repeat EGD at this point, nor transfusion. -Will add sucralfate to her regimen to help with esophagitis.  Chest pain could be related to esophagitis as it was severe, but primary team evaluating to exclude cardiac causes. -Advance diet.   -Follow H Pylori serology result    LOS: 3 days   Raider Valbuena M  09/28/2011, 8:36 AM

## 2011-09-28 NOTE — Progress Notes (Signed)
Pt transferred to unit 5500, report given to receiving nurse-Peggy.  Pt stable, able to transport via wheel chair, off monitor.  Pt educated on transfer, pt verbalized understanding of teaching.  MIVF off for transport, Peggy aware. 09/28/2011 3:43 PM [name], [place]

## 2011-09-28 NOTE — Progress Notes (Addendum)
Subjective: Per RN stools less maroon. She still continues to have dystonia and states it was because her Parkinson's medication was not ordered properly. Pharmacy has corrected it today.   Objective: Blood pressure 137/68, pulse 94, temperature 97.7 F (36.5 C), temperature source Oral, resp. rate 20, height 5\' 4"  (1.626 m), weight 84.8 kg (186 lb 15.2 oz), SpO2 99.00%. Weight change: -2.1 kg (-4 lb 10.1 oz)  Intake/Output Summary (Last 24 hours) at 09/28/11 1752 Last data filed at 09/28/11 1400  Gross per 24 hour  Intake   3565 ml  Output   2351 ml  Net   1214 ml    Physical Exam: General appearance: alert, cooperative, appears stated age and no distress  Resp: clear to auscultation bilaterally, maintaining room-air saturations of 96%  Cardio: regular rate and rhythm, S1, S2 normal, no murmur, click, rub or gallop, IV fluids at 125 cc per hour  GI: soft, non-tender; bowel sounds normal; no masses, no organomegaly, tolerating clear liquid diet, protonix drip and at 25 cc per hour  Extremities: extremities normal, atraumatic, no cyanosis or edema  Neurologic: Grossly normal, alert and oriented and has dystonic and uncontrolled movements related to exacerbation of Parkinson's disease. Patient endorses at home but does not take medications at usual prescribed time these movements will occur.   Lab Results:  [facility] 09/27/11 0640 09/25/11 2243  NA 140 139  K 3.9 4.8  CL 110 105  CO2 25 28  GLUCOSE 99 143*  BUN 17 21  CREATININE 0.77 0.86  CALCIUM 8.0* 8.9  MG -- --  PHOS -- --    Basename 09/25/11 2243  AST 21  ALT 8  ALKPHOS 68  BILITOT 0.2*  PROT 6.0  ALBUMIN 3.0*   No results found for this basename: LIPASE:2,AMYLASE:2 in the last 72 hours  Basename 09/28/11 0600 09/27/11 2158 09/26/11 1733 09/25/11 2243  WBC 8.2 -- 11.1* --  NEUTROABS -- -- -- 9.6*  HGB 8.8* 8.5* -- --  HCT 27.2* 26.5* -- --  MCV 85.5 -- 84.4 --  PLT 152 -- 159 --    Basename 09/28/11 1449  09/28/11 0902  CKTOTAL 214* 213*  CKMB 6.1* 5.8*  CKMBINDEX -- --  TROPONINI <0.30 <0.30   No components found with this basename: POCBNP:3 No results found for this basename: DDIMER:2 in the last 72 hours No results found for this basename: HGBA1C:2 in the last 72 hours No results found for this basename: CHOL:2,HDL:2,LDLCALC:2,TRIG:2,CHOLHDL:2,LDLDIRECT:2 in the last 72 hours No results found for this basename: TSH,T4TOTAL,FREET3,T3FREE,THYROIDAB in the last 72 hours No results found for this basename: VITAMINB12:2,FOLATE:2,FERRITIN:2,TIBC:2,IRON:2,RETICCTPCT:2 in the last 72 hours  Micro Results: Recent Results (from the past 240 hour(s))  MRSA PCR SCREENING     Status: Normal   Collection Time   09/26/11  3:18 AM      Component Value Range Status Comment   MRSA by PCR NEGATIVE  NEGATIVE  Final     Studies/Results: Dg Chest Port 1 View  09/26/2011  *RADIOLOGY REPORT*  Clinical Data: Central line placement.  PORTABLE CHEST - 1 VIEW  Comparison: Chest x-ray 09/26/2011.  Findings: The cardiac silhouette, mediastinal and hilar contours are stable.  Persistent mild vascular congestion.  The right IJ central venous catheter tip is near the cavoatrial junction.  No complicating features such as a pneumothorax.  IMPRESSION: Right IJ central venous catheter tip is near the cavoatrial junction.  No pneumothorax.  Original Report Authenticated By: P. Loralie Champagne, M.D.   Dg Chest  Port 1 View  09/26/2011  *RADIOLOGY REPORT*  Clinical Data: Vomiting.  History of hypertension and Parkinson's disease.  PORTABLE CHEST - 1 VIEW  Comparison: 07/12/2009 and 09/23/2007.  Findings: 0022 hours.  The heart size and mediastinal contours are stable.  There is new patchy retrocardiac opacity associated with blunting of the left costophrenic angle.  The right lung is clear. There is no edema or pneumothorax.  Telemetry leads overlie the chest.  IMPRESSION: New left lower lobe air space disease with probable  adjacent small left pleural effusion.  Findings could reflect atelectasis; aspiration cannot be excluded.  Original Report Authenticated By: Gerrianne Scale, M.D.    Medications: Scheduled Meds:   . B-complex with vitamin C  1 tablet Oral Daily  . carbidopa-levodopa  2 tablet Oral TID WC  . carbidopa-levodopa  1 tablet Oral QHS  . LORazepam  0.5 mg Oral Once  . magnesium oxide  400 mg Oral Daily  . nitroGLYCERIN      . pantoprazole  40 mg Oral BID AC  . pramipexole  1.5 mg Oral Custom  . sucralfate  1 g Oral Q6H  . white petrolatum      . DISCONTD: pramipexole  1.5 mg Oral TID   Continuous Infusions:   . sodium chloride 50 mL/hr at 09/28/11 1344  . DISCONTD: pantoprozole (PROTONIX) infusion 8 mg/hr (09/28/11 0543)   PRN Meds:.nitroGLYCERIN, ondansetron (ZOFRAN) IV  Assessment/Plan: Principal Problem:  *Hematemesis- severe esophagitis and gastric ulcer found on EGD s/p Epi injection and clipping. Hgb stable. Advanced to full liquids. Will stop Protonix drip and place on PO bid protonix. Cont IVF at 50 cc/hr.    Acute blood loss anemia-Hemoglobin had increased from 7.4 to a peak of 9.4 after 2 units of packed red blood cells. Remaining stable. Will check iron stores tomorrow.  Chest pain this AM- Troponins are negative thus far. EKG was unremarkable. Possibly from severe esophagitis.   Hypotension with a history of HTN (hypertension) BP mildly elevated Bisoprolol on hold but will resume with holding parameters in case BP becomes more elevated.   Leucocytosis- likely stress reponse. Resolved.     Parkinson's disease    LOS: 3 days   [facility] [phone] 09/28/2011, 5:52 PM

## 2011-09-29 LAB — BASIC METABOLIC PANEL
CO2: 25 mEq/L (ref 19–32)
Calcium: 8.4 mg/dL (ref 8.4–10.5)
Chloride: 110 mEq/L (ref 96–112)
Creatinine, Ser: 0.59 mg/dL (ref 0.50–1.10)
GFR calc Af Amer: >90 mL/min (ref >90)
Sodium: 143 mEq/L (ref 135–145)

## 2011-09-29 LAB — CARDIAC PANEL(CRET KIN+CKTOT+MB+TROPI)
CK, MB: 4.9 ng/mL — ABNORMAL HIGH (ref 0.3–4.0)
Relative Index: 3 — ABNORMAL HIGH (ref 0.0–2.5)
Total CK: 166 U/L (ref 7–177)
Troponin I: <0.3 ng/mL (ref <0.30)

## 2011-09-29 LAB — CBC
MCH: 28.1 pg (ref 26.0–34.0)
MCV: 85.2 fL (ref 78.0–100.0)
Platelets: 149 10*3/uL — ABNORMAL LOW (ref 150–400)
RBC: 3.1 MIL/uL — ABNORMAL LOW (ref 3.87–5.11)
RDW: 15 % (ref 11.5–15.5)
WBC: 7.3 10*3/uL (ref 4.0–10.5)

## 2011-09-29 LAB — URINE CULTURE
Colony Count: 50000
Culture  Setup Time: 201301061746

## 2011-09-29 LAB — URINALYSIS, ROUTINE W REFLEX MICROSCOPIC
Bilirubin Urine: NEGATIVE
Hgb urine dipstick: NEGATIVE
Ketones, ur: NEGATIVE mg/dL
Specific Gravity, Urine: 1.014 (ref 1.005–1.030)
pH: 7 (ref 5.0–8.0)

## 2011-09-29 LAB — FERRITIN: Ferritin: 30 ng/mL (ref 10–291)

## 2011-09-29 LAB — IRON AND TIBC: Iron: <10 ug/dL — ABNORMAL LOW (ref 42–135)

## 2011-09-29 NOTE — Progress Notes (Signed)
Subjective: Patient seen and examined this am. Informs feeling better today  Objective:  Vital signs in last 24 hours:  Filed Vitals:   09/28/11 1529 09/28/11 2105 09/29/11 0505 09/29/11 1418  BP: 137/68 134/83 119/78 97/58  Pulse: 94 94 79 79  Temp: 97.7 F (36.5 C) 98 F (36.7 C) 97.7 F (36.5 C) 98.3 F (36.8 C)  TempSrc: Oral     Resp: 20 20 20 18   Height:      Weight: 84.8 kg (186 lb 15.2 oz)     SpO2: 99% 97% 96% 95%    Intake/Output from previous day:   Intake/Output Summary (Last 24 hours) at 09/29/11 1619 Last data filed at 09/29/11 1341  Gross per 24 hour  Intake   1160 ml  Output    550 ml  Net    610 ml    Physical Exam:  General: [name] in no acute distress. HEENT: no pallor, no icterus, moist oral mucosa, no JVD, no lymphadenopathy Heart: Normal  s1 &s2  Regular rate and rhythm, without murmurs, rubs, gallops. Lungs: Clear to auscultation bilaterally. Abdomen: Soft, nontender, nondistended, positive bowel sounds. Extremities: No clubbing cyanosis or edema with positive pedal pulses. Neuro: Alert, awake, oriented x3, nonfocal.   Lab Results:  Basic Metabolic Panel:    Component Value Date/Time   NA 143 09/29/2011 0758   K 3.7 09/29/2011 0758   CL 110 09/29/2011 0758   CO2 25 09/29/2011 0758   BUN 9 09/29/2011 0758   CREATININE 0.59 09/29/2011 0758   GLUCOSE 109* 09/29/2011 0758   CALCIUM 8.4 09/29/2011 0758   CBC:    Component Value Date/Time   WBC 7.3 09/29/2011 0758   HGB 8.7* 09/29/2011 0758   HCT 26.4* 09/29/2011 0758   PLT 149* 09/29/2011 0758   MCV 85.2 09/29/2011 0758   NEUTROABS 9.6* 09/25/2011 2243   LYMPHSABS 0.9 09/25/2011 2243   MONOABS 0.7 09/25/2011 2243   EOSABS 0.1 09/25/2011 2243   BASOSABS 0.0 09/25/2011 2243    Recent Results (from the past 240 hour(s))  MRSA PCR SCREENING     Status: Normal   Collection Time   09/26/11  3:18 AM      Component Value Range Status Comment   MRSA by PCR NEGATIVE  NEGATIVE  Final     Studies/Results: No  results found.  Medications: Scheduled Meds:   . B-complex with vitamin C  1 tablet Oral Daily  . bisoprolol  5 mg Oral Daily  . carbidopa-levodopa  2 tablet Oral TID WC  . carbidopa-levodopa  1 tablet Oral QHS  . magnesium oxide  400 mg Oral Daily  . pantoprazole  40 mg Oral BID AC  . pramipexole  1.5 mg Oral Custom  . sucralfate  1 g Oral Q6H   Continuous Infusions:   . sodium chloride 50 mL/hr at 09/29/11 0243   PRN Meds:.nitroGLYCERIN, ondansetron (ZOFRAN) IV  Assessment  4 [name] with parkinson's disease, HTN, anemia presented with hematemesis and found to have severe esophagitis and gastric ulcer on EGD.patient transferred from stepdown.   Plan:  Hematemesis Patient admitted to stepdown severe esophagitis and gastric ulcer found on EGD s/p Epi injection and clipping. Hgb stable. Advanced to full liquids. stopped Protonix drip and place on PO bid protonix. Which she will need to continue for 3 months Cont sucralfate Cont IVF at 50 cc/hr.  GI signed off  Acute blood loss anemia- Received 2 u PRBC stable At 8.8. No need for further EGD  Chest pain  Likely due to esophagitis  cardiac enzymes negative  Hypotension with a history of HTN (hypertension)  BP low normal. Will hold meds  Leucocytosis- likely stress reponse. Resolved.   Parkinson's disease  Cont sinemet  PT eval   LOS: 4 days   [name] 09/29/2011, 4:19 PM

## 2011-09-29 NOTE — Progress Notes (Signed)
Physical Therapy Evaluation Patient Details Name: [name] MRN: [id number] DOB: 1941-05-04 Today's Date: 09/29/2011  Problem List:  Patient Active Problem List  Diagnoses  . Hematemesis  . Acute blood loss anemia  . Hypotension  . Leucocytosis  . Aspiration of blood  . Parkinson's disease  . HTN (hypertension)  . GI bleed  . Gastric ulcer with hemorrhage  . Esophagitis, erosive    Past Medical History:  Past Medical History  Diagnosis Date  . Parkinson's disease   . Hypertension   . Arthritis   . Anemia    Past Surgical History:  Past Surgical History  Procedure Date  . Hip arthroscopy w/ labral repair   . Thyroidectomy     PT Assessment/Plan/Recommendation PT Assessment Clinical Impression Statement: Pt would benefit from PT in acute care setting to maximize strength and mobility for safe return home alone. PT Recommendation/Assessment: Patient will need skilled PT in the acute care venue PT Problem List: Decreased strength;Decreased activity tolerance;Decreased mobility Barriers to Discharge: None PT Therapy Diagnosis : Difficulty walking;Generalized weakness PT Plan PT Frequency: Min 3X/week PT Treatment/Interventions: DME instruction;Gait training;Functional mobility training;Therapeutic activities;Therapeutic exercise;Balance training;Patient/family education PT Recommendation Follow Up Recommendations: Home health PT Equipment Recommended: None recommended by PT PT Goals  Acute Rehab PT Goals PT Goal Formulation: With patient Pt will go Supine/Side to Sit: with modified independence Pt will go Sit to Supine/Side: with modified independence Pt will go Sit to Stand: with modified independence Pt will go Stand to Sit: with modified independence Pt will Transfer Bed to Chair/Chair to Bed: with modified independence Pt will Ambulate: >150 feet;with supervision;with rolling walker  PT Evaluation Precautions/Restrictions    Prior Functioning  Home  Living Lives With: Alone Receives Help From: Friend(s) Type of Home: House Home Layout: One level Home Access: Ramped entrance Home Adaptive Equipment: Dan Humphreys - four wheeled Prior Function Level of Independence: Independent with basic ADLs;Requires assistive device for independence;Independent with transfers Driving: No Cognition Cognition Arousal/Alertness: Awake/alert Overall Cognitive Status: Appears within functional limits for tasks assessed Orientation Level: Oriented X4 Sensation/Coordination   Extremity Assessment   Mobility (including Balance) Bed Mobility Bed Mobility: Yes Supine to Sit: 5: Supervision Transfers Transfers: Yes Sit to Stand: 4: Min assist Sit to Stand Details (indicate cue type and reason): verbal cues for hand placement Stand to Sit: 4: Min assist Stand to Sit Details: min guard assist, verbal cues for safety Ambulation/Gait Ambulation/Gait: Yes Ambulation/Gait Assistance: 4: Min assist Ambulation/Gait Assistance Details (indicate cue type and reason): min guard assist Ambulation Distance (Feet): 300 Feet Assistive device: Rolling walker Gait Pattern: Right steppage;Left steppage    Exercise    End of Session PT - End of Session Equipment Utilized During Treatment: Gait belt Activity Tolerance: Patient tolerated treatment well Patient left: in chair Nurse Communication: Mobility status for transfers;Mobility status for ambulation General Behavior During Session: [facility] for tasks performed Cognition: [facility] for tasks performed  Ilda Foil 09/29/2011, 12:33 PM  Aida Raider, PT  Office # [phone] Pager [phone]

## 2011-09-29 NOTE — Progress Notes (Addendum)
[facility] Progress Note  Subjective: Pt moved out of ICU yesterday.  No pain.  Tolerating po intake.  Ready to go home, but knows she isn't "strong enough yet".  No n/v.  Reports RN says scant blood remains in stool.  She did not see stool.   Reports "gnawing" ant abd pain for "a few seconds after I pass my water".  Started yesterday.  No other dysuria.  Objective:  Vital signs in last 24 hours: Temp:  [97.4 F (36.3 C)-98 F (36.7 C)] 97.7 F (36.5 C) (01/06 0505) Pulse Rate:  [79-94] 79  (01/06 0505) Resp:  [20] 20  (01/06 0505) BP: (97-137)/(50-83) 119/78 mmHg (01/06 0505) SpO2:  [96 %-99 %] 96 % (01/06 0505) Weight:  [186 lb 15.2 oz (84.8 kg)] 186 lb 15.2 oz (84.8 kg) (01/05 1529) Last BM Date: 09/28/11 General:   Alert,  NAD, sitting in chair Heart:  Regular rate and rhythm; no murmurs Chest: CTA anteriorly Abdomen:  Soft, nontender and nondistended. Normal bowel sounds, without guarding, and without rebound.   Extremities:  Trace edema Neurologic:  Alert and  oriented x3. Psych:  Alert and cooperative. Flat affect  Intake/Output from previous day: 01/05 0701 - 01/06 0700 In: 2940 [P.O.:1640; I.V.:1300] Out: 700 [Urine:700] Intake/Output this shift: Total I/O In: 120 [P.O.:120] Out: -   Lab Results:  Basename 09/29/11 0758 09/28/11 0600 09/27/11 2158 09/26/11 1733  WBC 7.3 8.2 -- 11.1*  HGB 8.7* 8.8* 8.5* --  HCT 26.4* 27.2* 26.5* --  PLT 149* 152 -- 159   BMET  Basename 09/29/11 0758 09/27/11 0640  NA 143 140  K 3.7 3.9  CL 110 110  CO2 25 25  GLUCOSE 109* 99  BUN 9 17  CREATININE 0.59 0.77  CALCIUM 8.4 8.0*    Assessment / Plan: 71 yo female with Parkinson's admitted with acute UGI bleeding found to be secondary to gastric ulcer and severe erosive/ulcerative esophagitis.   1. UGI bleeding - PPI transitioned to BID therapy.  This should continue for at least 3 months.  Hgb is stable today which indicates no further bleeding. -Cont BID  PPI and sucralfate  -Follow H Pylori serology result and treat if positive (abx can be started at home for this infection)  2. Urinary pain - will add UA + cx given symptoms.    Given apparent resolution of bleeding, with sign off for now. Call with questions.  Principal Problem:  *Hematemesis Active Problems:  Acute blood loss anemia  Hypotension  Leucocytosis  Aspiration of blood  Parkinson's disease  HTN (hypertension)  GI bleed  Gastric ulcer with hemorrhage  Esophagitis, erosive     LOS: 4 days   Demeco Ducksworth M  09/29/2011, 11:04 AM

## 2011-09-30 ENCOUNTER — Encounter (HOSPITAL_COMMUNITY): Payer: Self-pay | Admitting: Internal Medicine

## 2011-09-30 LAB — CBC
MCH: 27.3 pg (ref 26.0–34.0)
MCHC: 31.4 g/dL (ref 30.0–36.0)
MCV: 86.8 fL (ref 78.0–100.0)
Platelets: 176 10*3/uL (ref 150–400)
RBC: 3.41 MIL/uL — ABNORMAL LOW (ref 3.87–5.11)
RDW: 15.6 % — ABNORMAL HIGH (ref 11.5–15.5)

## 2011-09-30 MED ORDER — FERROUS SULFATE 325 (65 FE) MG PO TABS: 325.0000 mg | ORAL_TABLET | Freq: Every day | ORAL | Status: DC

## 2011-09-30 MED ORDER — PANTOPRAZOLE SODIUM 40 MG PO TBEC: 40.0000 mg | DELAYED_RELEASE_TABLET | Freq: Two times a day (BID) | ORAL | Status: DC

## 2011-09-30 MED ORDER — CARBIDOPA-LEVODOPA CR 25-100 MG PO TBCR
2.0000 | EXTENDED_RELEASE_TABLET | Freq: Three times a day (TID) | ORAL | Status: DC
  Administered 2011-09-30 (×3): 2 via ORAL
  Filled 2011-09-30 (×4): qty 2

## 2011-09-30 MED ORDER — SUCRALFATE 1 GM/10ML PO SUSP: 1.0000 g | Freq: Four times a day (QID) | ORAL | Status: DC

## 2011-09-30 NOTE — Progress Notes (Signed)
Physical Therapy Treatment Patient Details Name: [name] MRN: [id number] DOB: 11/16/1940 Today's Date: 09/30/2011  PT Assessment/Plan  PT - Assessment/Plan Comments on Treatment Session: Pt. very motivatied and excited to work with therapy PT Plan: Discharge plan remains appropriate PT Frequency: Min 3X/week Follow Up Recommendations: Home health PT Equipment Recommended: None recommended by PT PT Goals  Acute Rehab PT Goals PT Goal: Supine/Side to Sit - Progress: Met PT Goal: Sit to Stand - Progress: Progressing toward goal PT Goal: Stand to Sit - Progress: Progressing toward goal PT Transfer Goal: Bed to Chair/Chair to Bed - Progress: Progressing toward goal PT Goal: Ambulate - Progress: Progressing toward goal  PT Treatment Precautions/Restrictions  Restrictions Weight Bearing Restrictions: No Mobility (including Balance) Bed Mobility Supine to Sit: 6: Modified independent (Device/Increase time) Transfers Sit to Stand: 4: Min assist;Other (comment);From chair/3-in-1;From bed;With upper extremity assist;With armrests (MinGuard A) Sit to Stand Details (indicate cue type and reason): Cues for effiecient technique and positioning Stand to Sit: 4: Min assist;Other (comment);To chair/3-in-1;With armrests;With upper extremity assist (MinGuard A) Stand to Sit Details: Cues to control descent into chair and to back all the way back up to chair prior to sitting Ambulation/Gait Ambulation/Gait Assistance: Other (comment) (MinGuard A) Ambulation/Gait Assistance Details (indicate cue type and reason): Cues to keep RW closer to body. Pt is use to 4 wheeled walker.  Ambulation Distance (Feet): 250 Feet Assistive device: Rolling walker Gait Pattern: Step-through pattern    Exercise  General Exercises - Lower Extremity Long Arc Quad: AROM;Both;15 reps;Seated Hip Flexion/Marching: AROM;Both;Other reps (comment);Seated (15) End of Session PT - End of Session Activity Tolerance:  Patient tolerated treatment well Patient left: with call bell in reach;in chair General Behavior During Session: [facility] for tasks performed Cognition: [facility] for tasks performed  Robinette, Adline Potter 09/30/2011, 1:14 PM  09/30/2011 Fredrich Birks PTA [phone] pager [phone] office

## 2011-09-30 NOTE — Progress Notes (Signed)
IV removed, site unremarkable.  Discharge instructions, prescriptions, and follow-up appointments given.  All belongings sent with pt and all questions answered.  Pt transported in wheelchair by tech and traveled home with family member by private vehicle.

## 2011-09-30 NOTE — Progress Notes (Signed)
   CARE MANAGEMENT NOTE 09/30/2011  Patient:  [name], [name]   Account Number:  [id number]  Date Initiated:  09/30/2011  Documentation initiated by:  Letha Cape  Subjective/Objective Assessment:   dx hematemesis  admit-     Action/Plan:   pt eval- rec hhpt  will need hhrn as well.   Anticipated DC Date:  09/30/2011   Anticipated DC Plan:  HOME W HOME HEALTH SERVICES      DC Planning Services  CM consult      [facility] Choice  HOME HEALTH   Choice offered to / List presented to:  C-1 Patient        HH arranged  HH-1 RN  HH-2 PT      [facility] agency  [facility]   Status of service:  Completed, signed off Medicare Important Message given?   (If response is "NO", the following Medicare IM given date fields will be blank) Date Medicare IM given:   Date Additional Medicare IM given:    Discharge Disposition:  HOME W HOME HEALTH SERVICES  Per UR Regulation:    Comments:  PCP  09/30/11 11:28 Letha Cape RN, BSN  [phone] Patient for discharge today, patient would like continue with [facility] for hhpt/rn.  Referral made to [facility], [name] notified.  Soc will begin 24-48 hrs post discharge.

## 2011-09-30 NOTE — Discharge Summary (Signed)
Patient ID: [name] MRN: [id number] DOB/AGE: Jun 21, 1941 70 y.o.  Admit date: 09/25/2011 Discharge date: 09/30/2011  Primary Care Physician:  REED,[name] L., DO  Discharge Diagnoses:    Present on Admission:    Principal Problem:  *Gastric ulcer with hemorrhage  Active Problems:  1.Hematemesis  2.Acute blood loss anemia  3.Hypotension  4.Leucocytosis 5. Esophagitis, erosive  6.Parkinson's disease  7.HTN (hypertension) 8. iron deficiency anemia    Current Discharge Medication List    START taking these medications   Details  pantoprazole (PROTONIX) 40 MG tablet Take 1 tablet (40 mg total) by mouth 2 (two) times daily before a meal. Qty: 90 tablet, Refills: 0    sucralfate (CARAFATE) 1 GM/10ML suspension Take 10 mLs (1 g total) by mouth every 6 (six) hours. Qty: 420 mL, Refills: 0      CONTINUE these medications which have CHANGED   Details  ferrous sulfate (FERROUSUL) 325 (65 FE) MG tablet Take 1 tablet (325 mg total) by mouth daily with breakfast. Qty: 30 tablet, Refills: 2      CONTINUE these medications which have NOT CHANGED   Details  albuterol (PROVENTIL) (2.5 MG/3ML) 0.083% nebulizer solution Take 2.5 mg by nebulization every 6 (six) hours as needed. Patient should be taking this 4 times daily but has been using this 4 times daily as needed     b complex vitamins tablet Take 1 tablet by mouth daily.      bisoprolol (ZEBETA) 5 MG tablet Take 5 mg by mouth daily.      carbidopa-levodopa (SINEMET CR) 50-200 MG per tablet Take 1 tablet by mouth 2 (two) times daily.     furosemide (LASIX) 40 MG tablet Take 40 mg by mouth 2 (two) times daily as needed. As needed for edema    magnesium oxide (MAG-OX) 400 MG tablet Take 400 mg by mouth 2 (two) times daily.      potassium chloride SA (K-DUR,KLOR-CON) 20 MEQ tablet Take 20 mEq by mouth every other day as needed. Patient reports this was recently changed to every other day and that she has not taken this  recently. She also reports that she only takes this when she also takes lasix    SALINE NASAL SPRAY NA Place 1 spray into the nose 3 (three) times daily as needed.     sennosides-docusate sodium (SENOKOT-S) 8.6-50 MG tablet Take 2 tablets by mouth daily as needed. Prescribed as 2 tabs each morning      STOP taking these medications     aspirin 81 MG chewable tablet      ranitidine (ZANTAC) 150 MG tablet         Disposition and Follow-up:  Follow up with PCP in 1 week  Outstanding issues:  needs follow up of H pylori ab sent this admission and will need abx course if positive. Also needs to check Hb on follow up visit.  please reassess to resume baby aspirin as outpatient once stable  Consults:   Pyrtle ( GI)  Significant Diagnostic Studies:  Dg Chest Port 1 View  09/26/2011  *RADIOLOGY REPORT*  Clinical Data: Central line placement.  PORTABLE CHEST - 1 VIEW  Comparison: Chest x-ray 09/26/2011.  Findings: The cardiac silhouette, mediastinal and hilar contours are stable.  Persistent mild vascular congestion.  The right IJ central venous catheter tip is near the cavoatrial junction.  No complicating features such as a pneumothorax.  IMPRESSION: Right IJ central venous catheter tip is near the cavoatrial junction.  No  pneumothorax.  Original Report Authenticated By: P. Loralie Champagne, M.D.   Dg Chest Port 1 View  09/26/2011  *RADIOLOGY REPORT*  Clinical Data: Vomiting.  History of hypertension and Parkinson's disease.  PORTABLE CHEST - 1 VIEW  Comparison: 07/12/2009 and 09/23/2007.  Findings: 0022 hours.  The heart size and mediastinal contours are stable.  There is new patchy retrocardiac opacity associated with blunting of the left costophrenic angle.  The right lung is clear. There is no edema or pneumothorax.  Telemetry leads overlie the chest.  IMPRESSION: New left lower lobe air space disease with probable adjacent small left pleural effusion.  Findings could reflect atelectasis;  aspiration cannot be excluded.  Original Report Authenticated By: Gerrianne Scale, M.D.    Brief H and P: For complete details please refer to admission H and P, but in brief 70 YO woman here with sudden onset of vomiting blood. She ate dinner earlier in the evening and by 9.30pm, she started having nausea and then vomited frank blood. She has so far vomited 3 times including in the ER. No anticoagulants, no prior history of GI bleed. Takes Baby aspirin only. No melena, no BRBPR. No recent GI workup. Since arriving the ER she has been feeling dizzy and weak. Has also been dropping her Blood pressure. Her initial hemoglobin is 9.9. Her CXR indicated some evidence of aspiration probably from hematemesis but she has no SOB.    Physical Exam on Discharge:  Filed Vitals:   09/29/11 0505 09/29/11 1418 09/29/11 2105 09/30/11 0500  BP: 119/78 97/58 99/62  148/84  Pulse: 79 79 77 71  Temp: 97.7 F (36.5 C) 98.3 F (36.8 C) 98.1 F (36.7 C) 97.6 F (36.4 C)  TempSrc:      Resp: 20 18 18 18   Height:      Weight:      SpO2: 96% 95% 98% 96%     Intake/Output Summary (Last 24 hours) at 09/30/11 1344 Last data filed at 09/30/11 0900  Gross per 24 hour  Intake   1480 ml  Output   1400 ml  Net     80 ml    General: elderly female in no acute distress.  HEENT: no pallor, no icterus, moist oral mucosa, no JVD, no lymphadenopathy  Heart: Normal s1 &s2 Regular rate and rhythm, without murmurs, rubs, gallops.  Lungs: Clear to auscultation bilaterally.  Abdomen: Soft, nontender, nondistended, positive bowel sounds.  Extremities: No clubbing cyanosis or edema with positive pedal pulses.  Neuro: Alert, awake, oriented x3, nonfocal.   CBC:    Component Value Date/Time   WBC 9.7 09/30/2011 0500   HGB 9.3* 09/30/2011 0500   HCT 29.6* 09/30/2011 0500   PLT 176 09/30/2011 0500   MCV 86.8 09/30/2011 0500   NEUTROABS 9.6* 09/25/2011 2243   LYMPHSABS 0.9 09/25/2011 2243   MONOABS 0.7 09/25/2011 2243   EOSABS  0.1 09/25/2011 2243   BASOSABS 0.0 09/25/2011 2243    Basic Metabolic Panel:    Component Value Date/Time   NA 143 09/29/2011 0758   K 3.7 09/29/2011 0758   CL 110 09/29/2011 0758   CO2 25 09/29/2011 0758   BUN 9 09/29/2011 0758   CREATININE 0.59 09/29/2011 0758   GLUCOSE 109* 09/29/2011 0758   CALCIUM 8.4 09/29/2011 0758    Hospital Course:   Acute Hematemesis  Patient admitted to stepdown unit and placed on PPI drip. Seen by GI and had EGD done showing  severe esophagitis and gastric ulcer s/p Epi  injection and clipping.  . stopped Protonix drip after 72 hrs  and place on PO bid protonix which she will need to continue for 3 months.  Cont sucralfate on discharge GI signed off and can be referred for follow up as outpatient if needed. Baby ASA being held on d/c which can be resumed once H&h stable and reassessed as outpatient. also started on iron sulfate 325 mg po daily as her iron level was <10. Dose can be increased as outpatient if  iron therapy tolerated.  Acute blood loss anemia- Received 2 u PRBC . Hb stable At 9.6 on discharge. No need for further EGD .  Chest pain  Likely due to esophagitis  cardiac enzymes negative and now resolved  Hypotension with a history of HTN (hypertension)  BP low normal. meds held and BO now stable . Her home meds can be resumed  Leucocytosis- likely stress reponse. Resolved.   Parkinson's disease  Cont sinemet  Patient clinically stable and will be discharged home with visiting nurse and HHPT.   Time spent on Discharge: 45 minutes  Signed: Eddie North 09/30/2011, 1:44 PM

## 2011-10-01 ENCOUNTER — Telehealth: Payer: Self-pay | Admitting: *Deleted

## 2011-10-01 MED ORDER — METRONIDAZOLE 250 MG PO TABS: ORAL_TABLET | ORAL | Status: DC

## 2011-10-01 MED ORDER — CLARITHROMYCIN 500 MG PO TABS: 500.0000 mg | ORAL_TABLET | Freq: Two times a day (BID) | ORAL | Status: AC

## 2011-10-01 NOTE — Telephone Encounter (Signed)
Beverley Fiedler, MD 10/01/2011 1:26 PM Signed  There is always the possibility for rebleeding with any ulcer. I would recommend checking CBC today or tomorrow  Home health possible could perform this test. This information will help [place] determine if there is rebleeding. If she is passing blood multiple times daily, she likely should be advised to return to the ED or be seen urgently in our clinic. Graciella Freer, RN 10/01/2011 11:38 AM Signed  Spoke with pt about + H. Pylori and the need for antibiotics. Ordered metronidazole 250mg  qid and clarithromycin 500mg  bid x 14 days along with pantoprazole. Pt reports her stools remain dark and bloody. Other than being "scared", she has no other complaints.  Dr Rhea Belton, I explained the dark stools could be from old blood; is there anything else should be concerned about? Thanks. Graciella Freer, RN 10/01/2011 10:36 AM Signed  lmom for pt to call back. Graciella Freer, RN 10/01/2011 10:03 AM Signed  Message copied by Florene Glen on Tue Oct 01, 2011 10:03 AM  ------  Message from: Beverley Fiedler  Created: Mon Sep 30, 2011 4:02 PM  Maralyn Sago  Ms. Bunkley was seen by [place] late in the week and weekend and she had a GU which was treated. She is still in the hospital  H. pylori serum antibody is positive.  We should recommend treatment with 14 days of triple therapy after discharge  Thanks   lmom for pt to call back. Faxed an order to Gilman at [facility] to draw a CBC on pt today or tomorrow. Fax [phone]

## 2011-10-01 NOTE — Telephone Encounter (Signed)
There is always the possibility for rebleeding with any ulcer.  I would recommend checking CBC today or tomorrow Home health possible could perform this test.  This information will help [place] determine if there is rebleeding.  If she is passing blood multiple times daily, she likely should be advised to return to the ED or be seen urgently in our clinic.

## 2011-10-01 NOTE — Telephone Encounter (Signed)
lmom for pt to call back

## 2011-10-01 NOTE — Telephone Encounter (Signed)
Other note closed in error.

## 2011-10-01 NOTE — Telephone Encounter (Signed)
Refer to next encounter.

## 2011-10-01 NOTE — Telephone Encounter (Signed)
Spoke with pt about + H. Pylori and the need for antibiotics. Ordered metronidazole 250mg  qid and clarithromycin 500mg  bid x 14 days along with pantoprazole. Pt reports her stools remain dark and bloody. Other than being "scared", she has no other complaints. Dr Rhea Belton, I explained the dark stools could be from old blood; is there anything else should be concerned about? Thanks.

## 2011-10-01 NOTE — Telephone Encounter (Signed)
Message copied by Florene Glen on Tue Oct 01, 2011 10:03 AM ------      Message from: Beverley Fiedler      Created: Mon Sep 30, 2011  4:02 PM       Maralyn Sago      Ms. Carawan was seen by [place] late in the week and weekend and she had a GU which was treated. She is still in the hospital      H. pylori serum antibody is positive.      We should recommend treatment with 14 days of triple therapy after discharge      Thanks

## 2011-10-01 NOTE — Telephone Encounter (Signed)
lmom for pt to call back; faxed an order to [facility] to draw a CBC

## 2011-10-01 NOTE — Telephone Encounter (Signed)
Spoke with pt to inform her we will check her blood to make sure she isn't bleeding. Also instructed her to call us or go to the ER if she has several bloody stools in a day or BRB. Pt stated understanding.

## 2011-10-02 ENCOUNTER — Other Ambulatory Visit (INDEPENDENT_AMBULATORY_CARE_PROVIDER_SITE_OTHER): Payer: Medicare Other

## 2011-10-02 ENCOUNTER — Other Ambulatory Visit: Payer: Self-pay | Admitting: Gastroenterology

## 2011-10-02 ENCOUNTER — Telehealth: Payer: Self-pay | Admitting: *Deleted

## 2011-10-02 DIAGNOSIS — M159 Polyosteoarthritis, unspecified: Secondary | ICD-10-CM | POA: Diagnosis not present

## 2011-10-02 DIAGNOSIS — K250 Acute gastric ulcer with hemorrhage: Secondary | ICD-10-CM | POA: Diagnosis not present

## 2011-10-02 DIAGNOSIS — IMO0001 Reserved for inherently not codable concepts without codable children: Secondary | ICD-10-CM | POA: Diagnosis not present

## 2011-10-02 DIAGNOSIS — R279 Unspecified lack of coordination: Secondary | ICD-10-CM | POA: Diagnosis not present

## 2011-10-02 DIAGNOSIS — R269 Unspecified abnormalities of gait and mobility: Secondary | ICD-10-CM | POA: Diagnosis not present

## 2011-10-02 DIAGNOSIS — I10 Essential (primary) hypertension: Secondary | ICD-10-CM | POA: Diagnosis not present

## 2011-10-02 DIAGNOSIS — G20 Parkinson's disease: Secondary | ICD-10-CM | POA: Diagnosis not present

## 2011-10-02 LAB — CBC WITH DIFFERENTIAL/PLATELET
Basophils Relative: 0.8 % (ref 0.0–3.0)
Eosinophils Relative: 4.6 % (ref 0.0–5.0)
HCT: 26.9 % — ABNORMAL LOW (ref 36.0–46.0)
MCV: 86.4 fl (ref 78.0–100.0)
Monocytes Relative: 8.3 % (ref 3.0–12.0)
Neutrophils Relative %: 73.5 % (ref 43.0–77.0)
Platelets: 176 10*3/uL (ref 150.0–400.0)
RBC: 3.11 Mil/uL — ABNORMAL LOW (ref 3.87–5.11)
WBC: 8.3 10*3/uL (ref 4.5–10.5)

## 2011-10-02 MED ORDER — LEVOFLOXACIN 500 MG PO TABS: ORAL_TABLET | ORAL | Status: DC

## 2011-10-02 NOTE — Telephone Encounter (Signed)
Spoke with pt this am to inform her she has an UTI and needs another antibiotic, Levaquin 500mg  once daily for 7 days. Pt reports she took her first dose of antibiotic therapy for H. Pylori this am. Instructed her to stop the H.P. Antibiotic when she receives the Levaquin; take it for 7 days, then continue when the Biaxin, etc. Pt reports she hasn't had a BM in 3 days, however, she has bleeding or seepage of thick dark blood with clots. She denies abdominal cramping. Should pt take a laxative- she has senekot s and miralax? [facility] has not come yet to draw her blood. I did inform her we may need to readmit her.

## 2011-10-02 NOTE — Telephone Encounter (Signed)
Message copied by Florene Glen on Wed Oct 02, 2011  4:33 PM ------      Message from: Beverley Fiedler      Created: Wed Oct 02, 2011  4:27 PM       Hemoglobin is 8.7 which is slightly lower than discharge 2 days ago.  The history that she has not having diarrhea or frequent loose stools is noted in somewhat reassuring.      If she continues to pass blood, either old or fresh, she needs to be seen either in the office urgently or in the ER      Finally, if she is no physical evidence of rebleeding, i.e. in her stools, CBC should be repeated the home health nurse in one week

## 2011-10-02 NOTE — Telephone Encounter (Signed)
Spoke with pt to inform her of Dr Lauro Franklin findings and recommendations. Pt reports no other stool except for the one this am. The "discharge" from her rectum has decreased. Again, I instructed her to stop the Flagyl and Biaxin until she completes the Levaquin; she also needs to continue the pantoprazole. Pt stated understanding. Called Barbara at [facility], [phone] to ask how often a nurse is seeing the pt; pt is only getting therapy. Can we order a RN visit for medication understanding/education? Thanks.

## 2011-10-02 NOTE — Progress Notes (Signed)
Dr. Rhea Belton,  I see that you have prescribed Levaquin.  Let me know if anything else needs doing.  Sorry about the tardy response, not used to looking at "in [place]", and I was off last week...  Let me know if anything else needs doing.

## 2011-10-02 NOTE — Telephone Encounter (Signed)
Pt just called back to report having a "normal BM" for her; no diarrrhea. She did pass old dark blood with the stool. Informed pt Dr [name] stated she would be having diarrhea if she was still bleeding; informed her this is a good sign. Again, instructed her to stop the H.P. meds until she finishes her Levaquin; pt stated understanding.

## 2011-10-03 DIAGNOSIS — I10 Essential (primary) hypertension: Secondary | ICD-10-CM | POA: Diagnosis not present

## 2011-10-03 DIAGNOSIS — G20 Parkinson's disease: Secondary | ICD-10-CM | POA: Diagnosis not present

## 2011-10-03 DIAGNOSIS — IMO0001 Reserved for inherently not codable concepts without codable children: Secondary | ICD-10-CM | POA: Diagnosis not present

## 2011-10-03 DIAGNOSIS — R279 Unspecified lack of coordination: Secondary | ICD-10-CM | POA: Diagnosis not present

## 2011-10-03 DIAGNOSIS — R269 Unspecified abnormalities of gait and mobility: Secondary | ICD-10-CM | POA: Diagnosis not present

## 2011-10-03 DIAGNOSIS — M159 Polyosteoarthritis, unspecified: Secondary | ICD-10-CM | POA: Diagnosis not present

## 2011-10-03 NOTE — Telephone Encounter (Signed)
Pt reports no old blood in her stool today. The [facility] RN has been given info on CDIFF for pt to report d/t multiple antibiotic therapy.

## 2011-10-03 NOTE — Telephone Encounter (Signed)
Faxed order for RN Eval and Assessment to [facility], fax [phone]; medication reconcilliation, s&s to report to [place], etc.

## 2011-10-03 NOTE — Telephone Encounter (Signed)
Yes, RN visit is good idea.

## 2011-10-07 DIAGNOSIS — A048 Other specified bacterial intestinal infections: Secondary | ICD-10-CM | POA: Diagnosis not present

## 2011-10-07 DIAGNOSIS — G20 Parkinson's disease: Secondary | ICD-10-CM | POA: Diagnosis not present

## 2011-10-07 DIAGNOSIS — K2100 Gastro-esophageal reflux disease with esophagitis, without bleeding: Secondary | ICD-10-CM | POA: Diagnosis not present

## 2011-10-07 DIAGNOSIS — K250 Acute gastric ulcer with hemorrhage: Secondary | ICD-10-CM | POA: Diagnosis not present

## 2011-10-07 DIAGNOSIS — G20A1 Parkinson's disease without dyskinesia, without mention of fluctuations: Secondary | ICD-10-CM | POA: Diagnosis not present

## 2011-10-08 DIAGNOSIS — IMO0001 Reserved for inherently not codable concepts without codable children: Secondary | ICD-10-CM | POA: Diagnosis not present

## 2011-10-08 DIAGNOSIS — I10 Essential (primary) hypertension: Secondary | ICD-10-CM | POA: Diagnosis not present

## 2011-10-08 DIAGNOSIS — R269 Unspecified abnormalities of gait and mobility: Secondary | ICD-10-CM | POA: Diagnosis not present

## 2011-10-08 DIAGNOSIS — R279 Unspecified lack of coordination: Secondary | ICD-10-CM | POA: Diagnosis not present

## 2011-10-08 DIAGNOSIS — G20 Parkinson's disease: Secondary | ICD-10-CM | POA: Diagnosis not present

## 2011-10-08 DIAGNOSIS — M159 Polyosteoarthritis, unspecified: Secondary | ICD-10-CM | POA: Diagnosis not present

## 2011-10-10 DIAGNOSIS — IMO0001 Reserved for inherently not codable concepts without codable children: Secondary | ICD-10-CM | POA: Diagnosis not present

## 2011-10-10 DIAGNOSIS — M159 Polyosteoarthritis, unspecified: Secondary | ICD-10-CM | POA: Diagnosis not present

## 2011-10-10 DIAGNOSIS — R279 Unspecified lack of coordination: Secondary | ICD-10-CM | POA: Diagnosis not present

## 2011-10-10 DIAGNOSIS — I959 Hypotension, unspecified: Secondary | ICD-10-CM | POA: Diagnosis not present

## 2011-10-10 DIAGNOSIS — I10 Essential (primary) hypertension: Secondary | ICD-10-CM | POA: Diagnosis not present

## 2011-10-10 DIAGNOSIS — G20 Parkinson's disease: Secondary | ICD-10-CM | POA: Diagnosis not present

## 2011-10-10 DIAGNOSIS — R269 Unspecified abnormalities of gait and mobility: Secondary | ICD-10-CM | POA: Diagnosis not present

## 2011-10-11 DIAGNOSIS — R279 Unspecified lack of coordination: Secondary | ICD-10-CM | POA: Diagnosis not present

## 2011-10-11 DIAGNOSIS — R269 Unspecified abnormalities of gait and mobility: Secondary | ICD-10-CM | POA: Diagnosis not present

## 2011-10-11 DIAGNOSIS — M159 Polyosteoarthritis, unspecified: Secondary | ICD-10-CM | POA: Diagnosis not present

## 2011-10-11 DIAGNOSIS — IMO0001 Reserved for inherently not codable concepts without codable children: Secondary | ICD-10-CM | POA: Diagnosis not present

## 2011-10-11 DIAGNOSIS — G20 Parkinson's disease: Secondary | ICD-10-CM | POA: Diagnosis not present

## 2011-10-11 DIAGNOSIS — I10 Essential (primary) hypertension: Secondary | ICD-10-CM | POA: Diagnosis not present

## 2011-10-14 DIAGNOSIS — IMO0001 Reserved for inherently not codable concepts without codable children: Secondary | ICD-10-CM | POA: Diagnosis not present

## 2011-10-14 DIAGNOSIS — R269 Unspecified abnormalities of gait and mobility: Secondary | ICD-10-CM | POA: Diagnosis not present

## 2011-10-14 DIAGNOSIS — R279 Unspecified lack of coordination: Secondary | ICD-10-CM | POA: Diagnosis not present

## 2011-10-14 DIAGNOSIS — I10 Essential (primary) hypertension: Secondary | ICD-10-CM | POA: Diagnosis not present

## 2011-10-14 DIAGNOSIS — M159 Polyosteoarthritis, unspecified: Secondary | ICD-10-CM | POA: Diagnosis not present

## 2011-10-14 DIAGNOSIS — G20 Parkinson's disease: Secondary | ICD-10-CM | POA: Diagnosis not present

## 2011-10-16 DIAGNOSIS — M159 Polyosteoarthritis, unspecified: Secondary | ICD-10-CM | POA: Diagnosis not present

## 2011-10-16 DIAGNOSIS — R279 Unspecified lack of coordination: Secondary | ICD-10-CM | POA: Diagnosis not present

## 2011-10-16 DIAGNOSIS — G20 Parkinson's disease: Secondary | ICD-10-CM | POA: Diagnosis not present

## 2011-10-16 DIAGNOSIS — R269 Unspecified abnormalities of gait and mobility: Secondary | ICD-10-CM | POA: Diagnosis not present

## 2011-10-16 DIAGNOSIS — IMO0001 Reserved for inherently not codable concepts without codable children: Secondary | ICD-10-CM | POA: Diagnosis not present

## 2011-10-16 DIAGNOSIS — I10 Essential (primary) hypertension: Secondary | ICD-10-CM | POA: Diagnosis not present

## 2011-10-17 DIAGNOSIS — R269 Unspecified abnormalities of gait and mobility: Secondary | ICD-10-CM | POA: Diagnosis not present

## 2011-10-17 DIAGNOSIS — G20 Parkinson's disease: Secondary | ICD-10-CM | POA: Diagnosis not present

## 2011-10-17 DIAGNOSIS — IMO0001 Reserved for inherently not codable concepts without codable children: Secondary | ICD-10-CM | POA: Diagnosis not present

## 2011-10-17 DIAGNOSIS — M159 Polyosteoarthritis, unspecified: Secondary | ICD-10-CM | POA: Diagnosis not present

## 2011-10-17 DIAGNOSIS — R279 Unspecified lack of coordination: Secondary | ICD-10-CM | POA: Diagnosis not present

## 2011-10-17 DIAGNOSIS — I10 Essential (primary) hypertension: Secondary | ICD-10-CM | POA: Diagnosis not present

## 2011-10-23 DIAGNOSIS — R279 Unspecified lack of coordination: Secondary | ICD-10-CM | POA: Diagnosis not present

## 2011-10-23 DIAGNOSIS — R269 Unspecified abnormalities of gait and mobility: Secondary | ICD-10-CM | POA: Diagnosis not present

## 2011-10-23 DIAGNOSIS — G20 Parkinson's disease: Secondary | ICD-10-CM | POA: Diagnosis not present

## 2011-10-23 DIAGNOSIS — M159 Polyosteoarthritis, unspecified: Secondary | ICD-10-CM | POA: Diagnosis not present

## 2011-10-23 DIAGNOSIS — IMO0001 Reserved for inherently not codable concepts without codable children: Secondary | ICD-10-CM | POA: Diagnosis not present

## 2011-10-23 DIAGNOSIS — I10 Essential (primary) hypertension: Secondary | ICD-10-CM | POA: Diagnosis not present

## 2011-10-24 DIAGNOSIS — I10 Essential (primary) hypertension: Secondary | ICD-10-CM | POA: Diagnosis not present

## 2011-10-24 DIAGNOSIS — IMO0001 Reserved for inherently not codable concepts without codable children: Secondary | ICD-10-CM | POA: Diagnosis not present

## 2011-10-24 DIAGNOSIS — M159 Polyosteoarthritis, unspecified: Secondary | ICD-10-CM | POA: Diagnosis not present

## 2011-10-24 DIAGNOSIS — R269 Unspecified abnormalities of gait and mobility: Secondary | ICD-10-CM | POA: Diagnosis not present

## 2011-10-24 DIAGNOSIS — G20 Parkinson's disease: Secondary | ICD-10-CM | POA: Diagnosis not present

## 2011-10-24 DIAGNOSIS — R279 Unspecified lack of coordination: Secondary | ICD-10-CM | POA: Diagnosis not present

## 2011-10-25 DIAGNOSIS — IMO0001 Reserved for inherently not codable concepts without codable children: Secondary | ICD-10-CM | POA: Diagnosis not present

## 2011-10-25 DIAGNOSIS — I10 Essential (primary) hypertension: Secondary | ICD-10-CM | POA: Diagnosis not present

## 2011-10-25 DIAGNOSIS — G20 Parkinson's disease: Secondary | ICD-10-CM | POA: Diagnosis not present

## 2011-10-25 DIAGNOSIS — R269 Unspecified abnormalities of gait and mobility: Secondary | ICD-10-CM | POA: Diagnosis not present

## 2011-10-25 DIAGNOSIS — R279 Unspecified lack of coordination: Secondary | ICD-10-CM | POA: Diagnosis not present

## 2011-10-25 DIAGNOSIS — M159 Polyosteoarthritis, unspecified: Secondary | ICD-10-CM | POA: Diagnosis not present

## 2011-10-29 DIAGNOSIS — R269 Unspecified abnormalities of gait and mobility: Secondary | ICD-10-CM | POA: Diagnosis not present

## 2011-10-29 DIAGNOSIS — M159 Polyosteoarthritis, unspecified: Secondary | ICD-10-CM | POA: Diagnosis not present

## 2011-10-29 DIAGNOSIS — R279 Unspecified lack of coordination: Secondary | ICD-10-CM | POA: Diagnosis not present

## 2011-10-29 DIAGNOSIS — G20 Parkinson's disease: Secondary | ICD-10-CM | POA: Diagnosis not present

## 2011-10-29 DIAGNOSIS — IMO0001 Reserved for inherently not codable concepts without codable children: Secondary | ICD-10-CM | POA: Diagnosis not present

## 2011-10-29 DIAGNOSIS — I10 Essential (primary) hypertension: Secondary | ICD-10-CM | POA: Diagnosis not present

## 2011-11-04 DIAGNOSIS — I10 Essential (primary) hypertension: Secondary | ICD-10-CM | POA: Diagnosis not present

## 2011-11-04 DIAGNOSIS — IMO0001 Reserved for inherently not codable concepts without codable children: Secondary | ICD-10-CM | POA: Diagnosis not present

## 2011-11-04 DIAGNOSIS — R269 Unspecified abnormalities of gait and mobility: Secondary | ICD-10-CM | POA: Diagnosis not present

## 2011-11-04 DIAGNOSIS — R279 Unspecified lack of coordination: Secondary | ICD-10-CM | POA: Diagnosis not present

## 2011-11-04 DIAGNOSIS — M159 Polyosteoarthritis, unspecified: Secondary | ICD-10-CM | POA: Diagnosis not present

## 2011-11-04 DIAGNOSIS — G20 Parkinson's disease: Secondary | ICD-10-CM | POA: Diagnosis not present

## 2011-11-07 ENCOUNTER — Telehealth: Payer: Self-pay | Admitting: *Deleted

## 2011-11-07 NOTE — Telephone Encounter (Signed)
Spoke with pt and scheduled repeat EGD  To ensure healing of ulcer and H.Pylori. Mailed prep instructions for EGD.

## 2011-11-11 DIAGNOSIS — R279 Unspecified lack of coordination: Secondary | ICD-10-CM | POA: Diagnosis not present

## 2011-11-11 DIAGNOSIS — I10 Essential (primary) hypertension: Secondary | ICD-10-CM | POA: Diagnosis not present

## 2011-11-11 DIAGNOSIS — IMO0001 Reserved for inherently not codable concepts without codable children: Secondary | ICD-10-CM | POA: Diagnosis not present

## 2011-11-11 DIAGNOSIS — R269 Unspecified abnormalities of gait and mobility: Secondary | ICD-10-CM | POA: Diagnosis not present

## 2011-11-11 DIAGNOSIS — G20 Parkinson's disease: Secondary | ICD-10-CM | POA: Diagnosis not present

## 2011-11-11 DIAGNOSIS — M159 Polyosteoarthritis, unspecified: Secondary | ICD-10-CM | POA: Diagnosis not present

## 2011-11-14 DIAGNOSIS — M1990 Unspecified osteoarthritis, unspecified site: Secondary | ICD-10-CM | POA: Diagnosis not present

## 2011-11-18 DIAGNOSIS — Z131 Encounter for screening for diabetes mellitus: Secondary | ICD-10-CM | POA: Diagnosis not present

## 2011-11-18 DIAGNOSIS — I10 Essential (primary) hypertension: Secondary | ICD-10-CM | POA: Diagnosis not present

## 2011-11-18 DIAGNOSIS — R7989 Other specified abnormal findings of blood chemistry: Secondary | ICD-10-CM | POA: Diagnosis not present

## 2011-12-02 DIAGNOSIS — I10 Essential (primary) hypertension: Secondary | ICD-10-CM | POA: Diagnosis not present

## 2011-12-02 DIAGNOSIS — K2100 Gastro-esophageal reflux disease with esophagitis, without bleeding: Secondary | ICD-10-CM | POA: Diagnosis not present

## 2011-12-02 DIAGNOSIS — K5900 Constipation, unspecified: Secondary | ICD-10-CM | POA: Diagnosis not present

## 2011-12-02 DIAGNOSIS — A048 Other specified bacterial intestinal infections: Secondary | ICD-10-CM | POA: Diagnosis not present

## 2011-12-02 DIAGNOSIS — E875 Hyperkalemia: Secondary | ICD-10-CM | POA: Diagnosis not present

## 2011-12-05 DIAGNOSIS — H25019 Cortical age-related cataract, unspecified eye: Secondary | ICD-10-CM | POA: Diagnosis not present

## 2011-12-05 DIAGNOSIS — H04129 Dry eye syndrome of unspecified lacrimal gland: Secondary | ICD-10-CM | POA: Diagnosis not present

## 2011-12-05 DIAGNOSIS — H2510 Age-related nuclear cataract, unspecified eye: Secondary | ICD-10-CM | POA: Diagnosis not present

## 2011-12-05 DIAGNOSIS — H01009 Unspecified blepharitis unspecified eye, unspecified eyelid: Secondary | ICD-10-CM | POA: Diagnosis not present

## 2011-12-10 ENCOUNTER — Other Ambulatory Visit: Payer: Self-pay

## 2011-12-10 ENCOUNTER — Inpatient Hospital Stay (HOSPITAL_COMMUNITY)
Admission: EM | Admit: 2011-12-10 | Discharge: 2011-12-17 | DRG: 193 | Disposition: A | Discharge status: Home Health | Payer: Medicare Other | Attending: Cardiovascular Disease | Admitting: Cardiovascular Disease

## 2011-12-10 ENCOUNTER — Emergency Department (HOSPITAL_COMMUNITY): Payer: Medicare Other

## 2011-12-10 ENCOUNTER — Encounter (HOSPITAL_COMMUNITY): Payer: Self-pay | Admitting: Emergency Medicine

## 2011-12-10 DIAGNOSIS — I2781 Cor pulmonale (chronic): Secondary | ICD-10-CM | POA: Diagnosis present

## 2011-12-10 DIAGNOSIS — M129 Arthropathy, unspecified: Secondary | ICD-10-CM | POA: Diagnosis present

## 2011-12-10 DIAGNOSIS — I10 Essential (primary) hypertension: Secondary | ICD-10-CM | POA: Diagnosis present

## 2011-12-10 DIAGNOSIS — I5033 Acute on chronic diastolic (congestive) heart failure: Secondary | ICD-10-CM | POA: Diagnosis not present

## 2011-12-10 DIAGNOSIS — G20A1 Parkinson's disease without dyskinesia, without mention of fluctuations: Secondary | ICD-10-CM | POA: Diagnosis present

## 2011-12-10 DIAGNOSIS — G20 Parkinson's disease: Secondary | ICD-10-CM | POA: Diagnosis present

## 2011-12-10 DIAGNOSIS — D649 Anemia, unspecified: Secondary | ICD-10-CM | POA: Diagnosis present

## 2011-12-10 DIAGNOSIS — J189 Pneumonia, unspecified organism: Secondary | ICD-10-CM | POA: Diagnosis present

## 2011-12-10 DIAGNOSIS — I214 Non-ST elevation (NSTEMI) myocardial infarction: Secondary | ICD-10-CM

## 2011-12-10 DIAGNOSIS — E785 Hyperlipidemia, unspecified: Secondary | ICD-10-CM | POA: Diagnosis present

## 2011-12-10 DIAGNOSIS — R55 Syncope and collapse: Secondary | ICD-10-CM | POA: Diagnosis present

## 2011-12-10 DIAGNOSIS — I2789 Other specified pulmonary heart diseases: Secondary | ICD-10-CM | POA: Diagnosis present

## 2011-12-10 DIAGNOSIS — R0602 Shortness of breath: Secondary | ICD-10-CM | POA: Diagnosis not present

## 2011-12-10 DIAGNOSIS — Z79899 Other long term (current) drug therapy: Secondary | ICD-10-CM

## 2011-12-10 DIAGNOSIS — R339 Retention of urine, unspecified: Secondary | ICD-10-CM | POA: Diagnosis not present

## 2011-12-10 DIAGNOSIS — L03119 Cellulitis of unspecified part of limb: Secondary | ICD-10-CM | POA: Diagnosis present

## 2011-12-10 DIAGNOSIS — R079 Chest pain, unspecified: Secondary | ICD-10-CM | POA: Diagnosis not present

## 2011-12-10 DIAGNOSIS — L02419 Cutaneous abscess of limb, unspecified: Secondary | ICD-10-CM | POA: Diagnosis present

## 2011-12-10 DIAGNOSIS — R338 Other retention of urine: Secondary | ICD-10-CM | POA: Diagnosis not present

## 2011-12-10 DIAGNOSIS — S79919A Unspecified injury of unspecified hip, initial encounter: Secondary | ICD-10-CM | POA: Diagnosis not present

## 2011-12-10 DIAGNOSIS — I517 Cardiomegaly: Secondary | ICD-10-CM | POA: Diagnosis not present

## 2011-12-10 DIAGNOSIS — S79929A Unspecified injury of unspecified thigh, initial encounter: Secondary | ICD-10-CM | POA: Diagnosis not present

## 2011-12-10 DIAGNOSIS — I5189 Other ill-defined heart diseases: Secondary | ICD-10-CM | POA: Diagnosis present

## 2011-12-10 DIAGNOSIS — R059 Cough, unspecified: Secondary | ICD-10-CM | POA: Diagnosis not present

## 2011-12-10 DIAGNOSIS — R0789 Other chest pain: Secondary | ICD-10-CM | POA: Diagnosis not present

## 2011-12-10 DIAGNOSIS — R404 Transient alteration of awareness: Secondary | ICD-10-CM | POA: Diagnosis not present

## 2011-12-10 DIAGNOSIS — W19XXXA Unspecified fall, initial encounter: Secondary | ICD-10-CM

## 2011-12-10 DIAGNOSIS — I959 Hypotension, unspecified: Secondary | ICD-10-CM | POA: Diagnosis not present

## 2011-12-10 DIAGNOSIS — IMO0001 Reserved for inherently not codable concepts without codable children: Secondary | ICD-10-CM | POA: Diagnosis not present

## 2011-12-10 DIAGNOSIS — I509 Heart failure, unspecified: Secondary | ICD-10-CM | POA: Diagnosis present

## 2011-12-10 DIAGNOSIS — J9819 Other pulmonary collapse: Secondary | ICD-10-CM | POA: Diagnosis not present

## 2011-12-10 DIAGNOSIS — R9431 Abnormal electrocardiogram [ECG] [EKG]: Secondary | ICD-10-CM | POA: Diagnosis present

## 2011-12-10 DIAGNOSIS — K922 Gastrointestinal hemorrhage, unspecified: Secondary | ICD-10-CM | POA: Diagnosis present

## 2011-12-10 HISTORY — DX: Cellulitis of unspecified part of limb: L03.119

## 2011-12-10 LAB — URINALYSIS, ROUTINE W REFLEX MICROSCOPIC
Bilirubin Urine: NEGATIVE
Glucose, UA: NEGATIVE mg/dL
Hgb urine dipstick: NEGATIVE
Nitrite: NEGATIVE
Specific Gravity, Urine: 1.016 (ref 1.005–1.030)
pH: 6 (ref 5.0–8.0)

## 2011-12-10 LAB — CBC
HCT: 35.4 % — ABNORMAL LOW (ref 36.0–46.0)
Hemoglobin: 11.1 g/dL — ABNORMAL LOW (ref 12.0–15.0)
RBC: 4.43 MIL/uL (ref 3.87–5.11)

## 2011-12-10 LAB — BASIC METABOLIC PANEL
BUN: 13 mg/dL (ref 6–23)
CO2: 25 mEq/L (ref 19–32)
Chloride: 103 mEq/L (ref 96–112)
Creatinine, Ser: 0.69 mg/dL (ref 0.50–1.10)
GFR calc Af Amer: >90 mL/min (ref >90)
Glucose, Bld: 109 mg/dL — ABNORMAL HIGH (ref 70–99)
Potassium: 3.8 mEq/L (ref 3.5–5.1)

## 2011-12-10 LAB — POCT I-STAT TROPONIN I

## 2011-12-10 LAB — GLUCOSE, CAPILLARY: Glucose-Capillary: 104 mg/dL — ABNORMAL HIGH (ref 70–99)

## 2011-12-10 LAB — DIFFERENTIAL
Lymphocytes Relative: 15 % (ref 12–46)
Lymphs Abs: 1.5 10*3/uL (ref 0.7–4.0)
Monocytes Absolute: 0.8 10*3/uL (ref 0.1–1.0)
Monocytes Relative: 8 % (ref 3–12)
Neutro Abs: 7.3 10*3/uL (ref 1.7–7.7)
Neutrophils Relative %: 73 % (ref 43–77)

## 2011-12-10 LAB — MRSA PCR SCREENING: MRSA by PCR: NEGATIVE

## 2011-12-10 LAB — OCCULT BLOOD, POC DEVICE: Fecal Occult Bld: NEGATIVE

## 2011-12-10 LAB — CARDIAC PANEL(CRET KIN+CKTOT+MB+TROPI)
Relative Index: 0.7 (ref 0.0–2.5)
Troponin I: <0.3 ng/mL (ref <0.30)

## 2011-12-10 MED ORDER — NITROGLYCERIN 2 % TD OINT
1.0000 [in_us] | TOPICAL_OINTMENT | Freq: Once | TRANSDERMAL | Status: AC
  Administered 2011-12-10: 1 [in_us] via TOPICAL
  Filled 2011-12-10: qty 1

## 2011-12-10 MED ORDER — FERROUS SULFATE 325 (65 FE) MG PO TABS
325.0000 mg | ORAL_TABLET | Freq: Every day | ORAL | Status: DC
  Administered 2011-12-11 – 2011-12-17 (×7): 325 mg via ORAL
  Filled 2011-12-10 (×7): qty 1

## 2011-12-10 MED ORDER — FUROSEMIDE 10 MG/ML IJ SOLN
40.0000 mg | Freq: Two times a day (BID) | INTRAMUSCULAR | Status: DC
  Administered 2011-12-11 – 2011-12-12 (×3): 40 mg via INTRAVENOUS
  Filled 2011-12-10 (×5): qty 4

## 2011-12-10 MED ORDER — SALINE NASAL SPRAY 0.65 % NA SOLN: 1.0000 | NASAL | Status: DC | PRN

## 2011-12-10 MED ORDER — ALBUTEROL SULFATE (5 MG/ML) 0.5% IN NEBU
INHALATION_SOLUTION | RESPIRATORY_TRACT | Status: AC
  Administered 2011-12-10: 11:00:00
  Filled 2011-12-10: qty 1

## 2011-12-10 MED ORDER — CARBIDOPA-LEVODOPA ER 50-200 MG PO TBCR
1.0000 | EXTENDED_RELEASE_TABLET | Freq: Every day | ORAL | Status: DC
  Administered 2011-12-10 – 2011-12-16 (×7): 1 via ORAL
  Filled 2011-12-10 (×8): qty 1

## 2011-12-10 MED ORDER — ALBUTEROL SULFATE (5 MG/ML) 0.5% IN NEBU: 2.5000 mg | INHALATION_SOLUTION | Freq: Four times a day (QID) | RESPIRATORY_TRACT | Status: DC | PRN

## 2011-12-10 MED ORDER — GI COCKTAIL ~~LOC~~
30.0000 mL | Freq: Once | ORAL | Status: AC
  Administered 2011-12-10: 30 mL via ORAL
  Filled 2011-12-10: qty 30

## 2011-12-10 MED ORDER — ACETAMINOPHEN 325 MG PO TABS
650.0000 mg | ORAL_TABLET | ORAL | Status: DC | PRN
  Administered 2011-12-10 – 2011-12-17 (×9): 650 mg via ORAL
  Filled 2011-12-10 (×9): qty 2

## 2011-12-10 MED ORDER — PRAMIPEXOLE DIHYDROCHLORIDE 1.5 MG PO TABS
1.5000 mg | ORAL_TABLET | Freq: Four times a day (QID) | ORAL | Status: DC
  Administered 2011-12-10 – 2011-12-17 (×27): 1.5 mg via ORAL
  Filled 2011-12-10 (×33): qty 1

## 2011-12-10 MED ORDER — ASPIRIN 81 MG PO CHEW
324.0000 mg | CHEWABLE_TABLET | Freq: Once | ORAL | Status: AC
  Administered 2011-12-10: 324 mg via ORAL
  Filled 2011-12-10: qty 4

## 2011-12-10 MED ORDER — POTASSIUM CHLORIDE CRYS ER 20 MEQ PO TBCR
20.0000 meq | EXTENDED_RELEASE_TABLET | ORAL | Status: DC
  Administered 2011-12-12 – 2011-12-16 (×3): 20 meq via ORAL
  Filled 2011-12-10 (×3): qty 1

## 2011-12-10 MED ORDER — SALINE SPRAY 0.65 % NA SOLN
1.0000 | NASAL | Status: DC | PRN
  Filled 2011-12-10: qty 44

## 2011-12-10 MED ORDER — CARBIDOPA-LEVODOPA 25-100 MG PO TABS
2.0000 | ORAL_TABLET | Freq: Three times a day (TID) | ORAL | Status: DC
  Administered 2011-12-11 – 2011-12-17 (×20): 2 via ORAL
  Filled 2011-12-10 (×27): qty 2

## 2011-12-10 MED ORDER — METOPROLOL TARTRATE 12.5 MG HALF TABLET
12.5000 mg | ORAL_TABLET | Freq: Two times a day (BID) | ORAL | Status: DC
  Administered 2011-12-10 – 2011-12-14 (×7): 12.5 mg via ORAL
  Filled 2011-12-10 (×9): qty 1

## 2011-12-10 MED ORDER — ONDANSETRON HCL 4 MG/2ML IJ SOLN
4.0000 mg | Freq: Four times a day (QID) | INTRAMUSCULAR | Status: DC | PRN
  Administered 2011-12-17: 4 mg via INTRAVENOUS
  Filled 2011-12-10: qty 2

## 2011-12-10 MED ORDER — FAMOTIDINE IN NACL 20-0.9 MG/50ML-% IV SOLN
20.0000 mg | Freq: Once | INTRAVENOUS | Status: AC
  Administered 2011-12-10: 20 mg via INTRAVENOUS
  Filled 2011-12-10: qty 50

## 2011-12-10 MED ORDER — MAGNESIUM OXIDE 400 MG PO TABS
400.0000 mg | ORAL_TABLET | Freq: Two times a day (BID) | ORAL | Status: DC
  Administered 2011-12-10 – 2011-12-17 (×14): 400 mg via ORAL
  Filled 2011-12-10 (×15): qty 1

## 2011-12-10 MED ORDER — LEVOFLOXACIN 500 MG PO TABS
500.0000 mg | ORAL_TABLET | Freq: Every day | ORAL | Status: DC
  Administered 2011-12-11 – 2011-12-16 (×6): 500 mg via ORAL
  Filled 2011-12-10 (×7): qty 1

## 2011-12-10 MED ORDER — ZOLPIDEM TARTRATE 5 MG PO TABS
5.0000 mg | ORAL_TABLET | Freq: Every evening | ORAL | Status: DC | PRN
  Administered 2011-12-10: 5 mg via ORAL
  Filled 2011-12-10: qty 1

## 2011-12-10 MED ORDER — NITROGLYCERIN IN D5W 200-5 MCG/ML-% IV SOLN
5.0000 ug/min | Freq: Once | INTRAVENOUS | Status: DC
  Filled 2011-12-10: qty 250

## 2011-12-10 MED ORDER — ASPIRIN EC 81 MG PO TBEC
81.0000 mg | DELAYED_RELEASE_TABLET | Freq: Every day | ORAL | Status: DC
  Administered 2011-12-11 – 2011-12-17 (×7): 81 mg via ORAL
  Filled 2011-12-10 (×7): qty 1

## 2011-12-10 MED ORDER — SENNOSIDES-DOCUSATE SODIUM 8.6-50 MG PO TABS
2.0000 | ORAL_TABLET | Freq: Every day | ORAL | Status: DC
  Administered 2011-12-10 – 2011-12-16 (×7): 2 via ORAL
  Filled 2011-12-10 (×8): qty 2

## 2011-12-10 MED ORDER — SENNA-DOCUSATE SODIUM 8.6-50 MG PO TABS: 2.0000 | ORAL_TABLET | Freq: Every day | ORAL | Status: DC

## 2011-12-10 MED ORDER — NITROGLYCERIN 0.4 MG SL SUBL
0.4000 mg | SUBLINGUAL_TABLET | SUBLINGUAL | Status: DC | PRN
  Administered 2011-12-11 – 2011-12-15 (×3): 0.4 mg via SUBLINGUAL
  Filled 2011-12-10 (×2): qty 25

## 2011-12-10 NOTE — ED Notes (Signed)
Hager with cardiology states that at this time he does not want heparin or Nitro give IV. States to continue nitro paste

## 2011-12-10 NOTE — ED Notes (Signed)
Pt states that she is having mid chest pain that is non radiating. Pain is constant and unrelieved by GI cocktail and Pepcid.

## 2011-12-10 NOTE — ED Notes (Signed)
PA at bedside.

## 2011-12-10 NOTE — ED Notes (Signed)
Pt states that chest pain is worse with inspiration

## 2011-12-10 NOTE — ED Notes (Signed)
Pt states that she has a heartburn feeling in chest.

## 2011-12-10 NOTE — ED Notes (Signed)
Patient lives at home unknown reason for fall possible syncope. EMS was called first BP 88/palpation second BP 130/80.  IV left hand 18g. NSR on monitor. Exp wheezing 5mg  albuterol given by EMS.

## 2011-12-10 NOTE — ED Notes (Signed)
Pt developed leg cramps while using urinal and turning over for cleaning.

## 2011-12-10 NOTE — ED Provider Notes (Addendum)
71 year old female got dizzy at home and fell without loss of consciousness. She does have a history of GI bleed. She denies dark stools and denies taking any anti-inflammatory medication. EMS reports initial blood pressure of 88 systolic but all subsequent blood pressures have been normal. On exam, she does not have any conjunctival pallor. Heart is tachycardic with a 2/6 outflow murmur heard along left sternal border. Abdomen is soft and nontender. Extremities have venous stasis changes in the 1-2+ pitting edema. Workup has been initiated including stool Hemoccult, ECG, and laboratory evaluation.   Date: 12/10/2011 1035  Rate: 106  Rhythm: sinus tachycardia  QRS Axis: normal  Intervals: QT prolonged  ST/T Wave abnormalities: normal  Conduction Disutrbances:none  Narrative Interpretation: Low voltage. When compared with ECG of 09/28/2011, axis has shifted rightward. Old EKG Reviewed: changes noted    Dione Booze, MD 12/10/11 1114  Patient has developed chest pain while in the emergency department. Repeat ECG shows T-wave variation from an initial ECG, so she will need to be admitted for observation.   Date: 12/10/2011 1422  Rate: 95  Rhythm: normal sinus rhythm  QRS Axis: normal  Intervals: normal  ST/T Wave abnormalities: normal  Conduction Disutrbances:none  Narrative Interpretation: New T-wave inversion in lead 3.  Old EKG Reviewed: changes noted    Dione Booze, MD 12/10/11 1430

## 2011-12-10 NOTE — H&P (Signed)
[name] is an 71 y.o. female.   Chief Complaint: chest pain HPI:  The patient is a 71 year old Caucasian female with a history of Parkinson's disease and right heart failure, moderate pulmonary hypertension, chronic lower extremity edema, GI bleed in January, dyslipidemia.  Patient presented after falling today approximately 9:00 this morning. Patient states she started "feeling funny in the head" became weak and fell to the floor.  She fell in her left hip and knee. Reports cough and congestion all time and lower extremity edema all the time as well as "a little" dysuria.  While in the emergency room the patient developed a sharp chest pain midsternal 5/10 in intensity at its worst currently 3/10. This resulted in a T-wave inversion in lead 3. She notes the chest pain is worse with palpation and also with inspiration. Nitro paste was placed R. Chest. He states this helped a little bit.  Past Medical History  Diagnosis Date  . Parkinson's disease   . Hypertension   . Arthritis   . Anemia     Past Surgical History  Procedure Date  . Hip arthroscopy w/ labral repair   . Thyroidectomy   . Esophagogastroduodenoscopy 09/26/2011    Procedure: ESOPHAGOGASTRODUODENOSCOPY (EGD);  Surgeon: Erick Blinks, MD;  Location: [facility] ENDOSCOPY;  Service: Gastroenterology;  Laterality: N/A;  To be done at bedside.    Family History  Problem Relation Age of Onset  . GER disease Mother    Social History:  reports that she has never smoked. She does not have any smokeless tobacco history on file. She reports that she does not drink alcohol or use illicit drugs.  Allergies:  Allergies  Allergen Reactions  . Codeine Other (See Comments)    unknown  . Penicillins Other (See Comments)    unknown    Medications Prior to Admission  Medication Dose Route Frequency Provider Last Rate Last Dose  . albuterol (PROVENTIL) (5 MG/ML) 0.5% nebulizer solution           . aspirin chewable tablet 324 mg  324 mg Oral Once  Otilio Miu, PA   324 mg at 12/10/11 1432  . famotidine (PEPCID) IVPB 20 mg  20 mg Intravenous Once Otilio Miu, PA   20 mg at 12/10/11 1404  . gi cocktail (Maalox,Lidocaine,Donnatal)  30 mL Oral Once Otilio Miu, PA   30 mL at 12/10/11 1400  . nitroGLYCERIN (NITROGLYN) 2 % ointment 1 inch  1 inch Topical Once Otilio Miu, PA   1 inch at 12/10/11 1443  . nitroGLYCERIN 0.2 mg/mL in dextrose 5 % infusion  5-200 mcg/min Intravenous Once Otilio Miu, PA       Medications Prior to Admission  Medication Sig Dispense Refill  . albuterol (PROVENTIL) (2.5 MG/3ML) 0.083% nebulizer solution Take 2.5 mg by nebulization every 6 (six) hours as needed. For shortness of breath      . b complex vitamins tablet Take 1 tablet by mouth daily.        . carbidopa-levodopa (SINEMET CR) 50-200 MG per tablet Take 1 tablet by mouth at bedtime.       . furosemide (LASIX) 40 MG tablet Take 40-60 mg by mouth 2 (two) times daily. Patient takes 40 mg daily; if over 190 may increase to 1.5 tablets (60mg ) twice a day       . magnesium oxide (MAG-OX) 400 MG tablet Take 400 mg by mouth 2 (two) times daily.        Marland Kitchen  potassium chloride SA (K-DUR,KLOR-CON) 20 MEQ tablet Take 20 mEq by mouth every other day.       Marland Kitchen SALINE NASAL SPRAY NA Place 1 spray into the nose 3 (three) times daily as needed. For congestion      . sennosides-docusate sodium (SENOKOT-S) 8.6-50 MG tablet Take 2 tablets by mouth daily as needed. For constipation        Results for orders placed during the hospital encounter of 12/10/11 (from the past 48 hour(s))  GLUCOSE, CAPILLARY     Status: Abnormal   Collection Time   12/10/11 10:33 AM      Component Value Range Comment   Glucose-Capillary 104 (*) 70 - 99 (mg/dL)   OCCULT BLOOD, POC DEVICE     Status: Normal   Collection Time   12/10/11 11:13 AM      Component Value Range Comment   Fecal Occult Bld NEGATIVE     CBC     Status: Abnormal   Collection  Time   12/10/11 11:20 AM      Component Value Range Comment   WBC 10.0  4.0 - 10.5 (K/uL)    RBC 4.43  3.87 - 5.11 (MIL/uL)    Hemoglobin 11.1 (*) 12.0 - 15.0 (g/dL)    HCT 16.1 (*) 09.6 - 46.0 (%)    MCV 79.9  78.0 - 100.0 (fL)    MCH 25.1 (*) 26.0 - 34.0 (pg)    MCHC 31.4  30.0 - 36.0 (g/dL)    RDW 04.5 (*) 40.9 - 15.5 (%)    Platelets 138 (*) 150 - 400 (K/uL) PLATELET COUNT CONFIRMED BY SMEAR  DIFFERENTIAL     Status: Normal   Collection Time   12/10/11 11:20 AM      Component Value Range Comment   Neutrophils Relative 73  43 - 77 (%)    Neutro Abs 7.3  1.7 - 7.7 (K/uL)    Lymphocytes Relative 15  12 - 46 (%)    Lymphs Abs 1.5  0.7 - 4.0 (K/uL)    Monocytes Relative 8  3 - 12 (%)    Monocytes Absolute 0.8  0.1 - 1.0 (K/uL)    Eosinophils Relative 4  0 - 5 (%)    Eosinophils Absolute 0.4  0.0 - 0.7 (K/uL)    Basophils Relative 1  0 - 1 (%)    Basophils Absolute 0.1  0.0 - 0.1 (K/uL)   BASIC METABOLIC PANEL     Status: Abnormal   Collection Time   12/10/11 11:20 AM      Component Value Range Comment   Sodium 137  135 - 145 (mEq/L)    Potassium 3.8  3.5 - 5.1 (mEq/L)    Chloride 103  96 - 112 (mEq/L)    CO2 25  19 - 32 (mEq/L)    Glucose, Bld 109 (*) 70 - 99 (mg/dL)    BUN 13  6 - 23 (mg/dL)    Creatinine, Ser 8.11  0.50 - 1.10 (mg/dL)    Calcium 8.9  8.4 - 10.5 (mg/dL)    GFR calc non Af Amer 86 (*) >90 (mL/min)    GFR calc Af Amer >90  >90 (mL/min)   URINALYSIS, ROUTINE W REFLEX MICROSCOPIC     Status: Normal   Collection Time   12/10/11 12:51 PM      Component Value Range Comment   Color, Urine YELLOW  YELLOW     APPearance CLEAR  CLEAR     Specific Gravity,  Urine 1.016  1.005 - 1.030     pH 6.0  5.0 - 8.0     Glucose, UA NEGATIVE  NEGATIVE (mg/dL)    Hgb urine dipstick NEGATIVE  NEGATIVE     Bilirubin Urine NEGATIVE  NEGATIVE     Ketones, ur NEGATIVE  NEGATIVE (mg/dL)    Protein, ur NEGATIVE  NEGATIVE (mg/dL)    Urobilinogen, UA 1.0  0.0 - 1.0 (mg/dL)    Nitrite  NEGATIVE  NEGATIVE     Leukocytes, UA NEGATIVE  NEGATIVE  MICROSCOPIC NOT DONE ON URINES WITH NEGATIVE PROTEIN, BLOOD, LEUKOCYTES, NITRITE, OR GLUCOSE <1000 mg/dL.  POCT I-STAT TROPONIN I     Status: Normal   Collection Time   12/10/11  2:00 PM      Component Value Range Comment   Troponin i, poc 0.01  0.00 - 0.08 (ng/mL)    Comment 3             Dg Chest 2 View  12/10/2011  *RADIOLOGY REPORT*  Clinical Data: Chest pain and fall.  CHEST - 2 VIEW  Comparison: Chest radiograph 09/26/2011  Findings: The heart, mediastinal, and hilar contours are stable. Heart size remains prominent.  There is pulmonary vascular congestion.  Low lung volumes with left basilar atelectasis.  No visible pleural effusion or pneumothorax.  Surgical clips in the right neck.  No displaced rib fracture is identified.  IMPRESSION: Stable cardiomegaly with pulmonary vascular congestion.  Low lung volumes and left basilar atelectasis.  Original Report Authenticated By: Britta Mccreedy, M.D.   Dg Hip Complete Left  12/10/2011  *RADIOLOGY REPORT*  Clinical Data: Fall.  Left buttock region pain and swelling.  LEFT HIP - COMPLETE 2+ VIEW  Comparison: 06/06/2009  Findings: Right hip hemiarthroplasty noted.  No hip fracture or regional pelvic fracture is observed.  Spurring of the greater trochanter noted.  IMPRESSION:  1.  No acute bony findings.  Original Report Authenticated By: Dellia Cloud, M.D.    Review of Systems  Constitutional: Negative for fever and diaphoresis.  HENT: Negative for congestion (all the time).   Respiratory: Positive for cough (all the time).   Cardiovascular: Positive for chest pain, palpitations and leg swelling. Negative for orthopnea and PND.  Gastrointestinal: Negative for nausea, vomiting, abdominal pain, blood in stool and melena.  Genitourinary: Positive for dysuria. Negative for hematuria.  Musculoskeletal: Positive for back pain.  Neurological: Positive for dizziness and headaches.     Blood pressure 121/72, pulse 99, temperature 97.7 F (36.5 C), temperature source Oral, resp. rate 19, SpO2 98.00%. Physical Exam  Constitutional: She is oriented to person, place, and time.       Obese Caucasian female extremely deconditioned  HENT:  Head: Normocephalic.       Mild tenderness to the right side  Eyes: EOM are normal. Pupils are equal, round, and reactive to light. No scleral icterus.  Neck: Normal range of motion. Neck supple.  Cardiovascular: Normal rate and regular rhythm.   No murmur heard. Respiratory: She has rales.       Decreased effort mild rales at bases  GI: Soft. There is tenderness (tender to palpation epigastric region.).  Musculoskeletal: She exhibits edema (1-2+ lower extremity edema).       Superficial abrasion to the left knee. No ecchymosis or abrasion to left hip.  Lymphadenopathy:    She has no cervical adenopathy.  Neurological: She is alert and oriented to person, place, and time.       Strength 2/5  and equal upper extremities  Skin: Skin is warm and dry. There is erythema (bilateral lower extremities).       Bilateral calor  Psychiatric: She has a normal mood and affect.     Assessment/Plan Patient Active Hospital Problem List: Syncope Chest pain, atypical Cellulitis Volume overload Cor pulmonale Moderate hypertension Volume overload Parkinson's disease  Plan:  The patient will be admitted to the step down unit. We'll start her on IV diuretics. Will start levofloxacin for cellulitis. Cycle cardiac enzymes x3, BNP, Ddimer, recheck EKG in the morning.  Strict i/os and daily weights.  Urinalysis. Will monitor HR on tele and see if there is a arrhythmia source for syncope.   HAGER,BRYAN W 12/10/2011, 4:02 PM    Agree with note written by [facility]  Pt admitted with syncope and atypical CP. Pt of Dr. Renaye Rakers with cor pulmonale probably secondary to sleep disordered breathing. He had recommended diuretics which apparently she  never used secondary to increased urinary frequency. She denies SOB. On exam she has 1-2 + edema with skin changes c/w cellulitis. It is possible that her LOC was secondary to the weakness from her Parkinson's although can't r/o arrhythmia. Will admit to tele. IV ATBX for her cellulitis and iv diuretics.  Runell Gess 12/10/2011 6:07 PM

## 2011-12-10 NOTE — ED Notes (Signed)
CBG 104 

## 2011-12-10 NOTE — ED Notes (Signed)
2604-01 Ready 

## 2011-12-10 NOTE — ED Provider Notes (Signed)
History     CSN: [id number]  Arrival date & time 12/10/11  1024   First MD Initiated Contact with Patient 12/10/11 1042      Chief Complaint  Patient presents with  . Near Syncope    (Consider location/radiation/quality/duration/timing/severity/associated sxs/prior treatment) HPI History provided by pt.   Pt had a fall while walking in her bedroom this am. She is unsure of etiology of fall but does recall "feeling funny in her head" just prior.  Did not have sycnope, CP, SOB or palpitations.  Landed on her left side and is unsure of whether or not she hit her head but no LOC.  Denies headache.  Is not anti-coagulated.  Has not had any other falls recently.  Was using her walker at time of fall.   She is currently experiencing mild, non-radiating pain in center of chest that started in hospital bed and is not associated w/ dyspnea, diaphoresis or nausea.  No recent fever, cough, vomiting, diarrhea or urinary sx but has been feeling generally weak x 1wk.  Has been drinking fluids.  No recent medication changes.  Per prior chart, pt admitted in 09/2011 for bleeding gastric ulcer and esophagitis w/ acute anemia and hypotension.  She denies melena/hematochezia.   Past Medical History  Diagnosis Date  . Parkinson's disease   . Hypertension   . Arthritis   . Anemia     Past Surgical History  Procedure Date  . Hip arthroscopy w/ labral repair   . Thyroidectomy   . Esophagogastroduodenoscopy 09/26/2011    Procedure: ESOPHAGOGASTRODUODENOSCOPY (EGD);  Surgeon: Erick Blinks, MD;  Location: [facility] ENDOSCOPY;  Service: Gastroenterology;  Laterality: N/A;  To be done at bedside.    Family History  Problem Relation Age of Onset  . GER disease Mother     History  Substance Use Topics  . Smoking status: Never Smoker   . Smokeless tobacco: Not on file  . Alcohol Use: No    OB History    Grav Para Term Preterm Abortions TAB SAB Ect Mult Living                  Review of Systems  All other  systems reviewed and are negative.    Allergies  Codeine and Penicillins  Home Medications   Current Outpatient Rx  Name Route Sig Dispense Refill  . ALBUTEROL SULFATE (2.5 MG/3ML) 0.083% IN NEBU Nebulization Take 2.5 mg by nebulization every 6 (six) hours as needed. For shortness of breath    . B COMPLEX PO TABS Oral Take 1 tablet by mouth daily.      Marland Kitchen CARBIDOPA-LEVODOPA ER 50-200 MG PO TBCR Oral Take 1 tablet by mouth at bedtime.     Marland Kitchen CARBIDOPA-LEVODOPA 25-100 MG PO TABS Oral Take 2 tablets by mouth 3 (three) times daily.    Marland Kitchen FERROUS SULFATE 325 (65 FE) MG PO TABS Oral Take 325 mg by mouth daily.    . FUROSEMIDE 40 MG PO TABS Oral Take 40-60 mg by mouth 2 (two) times daily. Patient takes 40 mg daily; if over 190 may increase to 1.5 tablets (60mg ) twice a day     . MAGNESIUM OXIDE 400 MG PO TABS Oral Take 400 mg by mouth 2 (two) times daily.      Marland Kitchen POTASSIUM CHLORIDE CRYS ER 20 MEQ PO TBCR Oral Take 20 mEq by mouth every other day.     Marland Kitchen PRAMIPEXOLE DIHYDROCHLORIDE 1.5 MG PO TABS Oral Take 1.5 mg by mouth 4 (  four) times daily.    Marland Kitchen SALINE NASAL SPRAY NA Nasal Place 1 spray into the nose 3 (three) times daily as needed. For congestion    . SENNA-DOCUSATE SODIUM 8.6-50 MG PO TABS Oral Take 2 tablets by mouth daily as needed. For constipation      BP 134/52  Temp(Src) 97.7 F (36.5 C) (Oral)  Resp 24  SpO2 96%  Physical Exam  Nursing note and vitals reviewed. Constitutional: She is oriented to person, place, and time. She appears well-developed and well-nourished. No distress.  HENT:  Head: Normocephalic and atraumatic.       Mucous membranes dry   Eyes:       Normal appearance  Neck: Normal range of motion.  Cardiovascular: Normal rate, regular rhythm and intact distal pulses.   Pulmonary/Chest: Effort normal and breath sounds normal. No respiratory distress.       Sternal tenderness  Abdominal: Soft. Bowel sounds are normal. She exhibits no distension. There is no  tenderness.       obese  Genitourinary:       Nml rectal tone.  Stool color normal.  No gross blood.   Musculoskeletal: Normal range of motion.       Symmetric lower extremity edema.  Erythema and tenderness bilateral lower legs that pt reports is chronic and stable.  Left lateral hip ttp but no pain w/ passive flexion and internal/external rotation.  Distal sensation intact.   Neurological: She is alert and oriented to person, place, and time. No cranial nerve deficit.       5/5 and equal upper and lower extremity strength.  No sensory deficits.  No past pointing.     Skin: Skin is warm and dry. No rash noted.  Psychiatric: She has a normal mood and affect. Her behavior is normal.    ED Course  Procedures (including critical care time)   Date: 12/10/2011  Rate: 106  Rhythm: sinus tachycardia  QRS Axis: normal  Intervals: PR prolonged (borderline)  ST/T Wave abnormalities: normal  Conduction Disutrbances:none  Narrative Interpretation:   Old EKG Reviewed: unchanged   Labs Reviewed  GLUCOSE, CAPILLARY - Abnormal; Notable for the following:    Glucose-Capillary 104 (*)    All other components within normal limits  CBC - Abnormal; Notable for the following:    Hemoglobin 11.1 (*)    HCT 35.4 (*)    MCH 25.1 (*)    RDW 17.7 (*)    Platelets 138 (*) PLATELET COUNT CONFIRMED BY SMEAR   All other components within normal limits  BASIC METABOLIC PANEL - Abnormal; Notable for the following:    Glucose, Bld 109 (*)    GFR calc non Af Amer 86 (*)    All other components within normal limits  OCCULT BLOOD, POC DEVICE  DIFFERENTIAL  URINALYSIS, ROUTINE W REFLEX MICROSCOPIC  POCT I-STAT TROPONIN I   Dg Chest 2 View  12/10/2011  *RADIOLOGY REPORT*  Clinical Data: Chest pain and fall.  CHEST - 2 VIEW  Comparison: Chest radiograph 09/26/2011  Findings: The heart, mediastinal, and hilar contours are stable. Heart size remains prominent.  There is pulmonary vascular congestion.  Low lung  volumes with left basilar atelectasis.  No visible pleural effusion or pneumothorax.  Surgical clips in the right neck.  No displaced rib fracture is identified.  IMPRESSION: Stable cardiomegaly with pulmonary vascular congestion.  Low lung volumes and left basilar atelectasis.  Original Report Authenticated By: Britta Mccreedy, M.D.   Dg Hip Complete Left  12/10/2011  *RADIOLOGY REPORT*  Clinical Data: Fall.  Left buttock region pain and swelling.  LEFT HIP - COMPLETE 2+ VIEW  Comparison: 06/06/2009  Findings: Right hip hemiarthroplasty noted.  No hip fracture or regional pelvic fracture is observed.  Spurring of the greater trochanter noted.  IMPRESSION:  1.  No acute bony findings.  Original Report Authenticated By: Dellia Cloud, M.D.     1. NSTEMI (non-ST elevated myocardial infarction)   2. Fall       MDM  70yo F presents w/ c/o fall onto left side, preceded by vague dizziness today.  No syncope.  Unsure of whether or not she hit her head but no LOC, headache or neuro complaints and is not anti-coagulated.  No recent CP/SOB/palpitations but developed substernal chest burning while lying in bed in ED.  On exam, afebrile, mildly dehydrated, heart w/ RRR, lungs clear, abd benign/non-tender, no gross blood in rectum and left hip tenderness w/ nml ROM and no NV deficits.  Labs sig for neg hemoccult, troponin and U/A.  CXR stable.  Initial EKG non-ischemic but repeated when patient reported worsening CP despite treatment w/ pepcid and GI cocktail and shows t-wave changes in inferior leads.  Nitro paste applied and pain improved temporarily.  Consulted [facility] and they will admit for N-STEMI.  Request switch to nitro drip and heparin dosed per pharmacy.  Pt aware of assessment and plan.       Arie Sabina [place], [place] 12/10/11 1644

## 2011-12-10 NOTE — ED Notes (Signed)
Patient states did not have syncope event however does not know the reason of falling.  Patient takes medication for parkinsons four times a day 0600 she took missed 1000 dose and 1400 1800.  Patient has intermittent tremors entire body.  Patient states has chronic redness bilateral lower extremities about feet. Currently bilateral red with right  Anterior skin tear upon arrival 0.25cm irregular shaped no bleeding present.  Pedal pulses +2 bilaterally.  Airway intact bilateral equal chest rise and fall diminished lung sound lower lobes with expiratory wheezing upper lobes.  States chest pain 1-2/10 achy center of chest.

## 2011-12-10 NOTE — ED Notes (Signed)
Pt readjusted in bed for comfort. Pt states that her middle back is sore. Adjusted bed to attempt to alleviate pain.

## 2011-12-10 NOTE — ED Notes (Signed)
Cardiology at bedside.

## 2011-12-11 ENCOUNTER — Encounter (HOSPITAL_COMMUNITY): Payer: Self-pay | Admitting: *Deleted

## 2011-12-11 ENCOUNTER — Other Ambulatory Visit: Payer: Self-pay

## 2011-12-11 DIAGNOSIS — L03119 Cellulitis of unspecified part of limb: Secondary | ICD-10-CM | POA: Diagnosis not present

## 2011-12-11 DIAGNOSIS — J189 Pneumonia, unspecified organism: Secondary | ICD-10-CM | POA: Diagnosis not present

## 2011-12-11 DIAGNOSIS — R0789 Other chest pain: Secondary | ICD-10-CM | POA: Diagnosis not present

## 2011-12-11 DIAGNOSIS — R079 Chest pain, unspecified: Secondary | ICD-10-CM | POA: Diagnosis not present

## 2011-12-11 DIAGNOSIS — I5033 Acute on chronic diastolic (congestive) heart failure: Secondary | ICD-10-CM | POA: Diagnosis not present

## 2011-12-11 DIAGNOSIS — R55 Syncope and collapse: Secondary | ICD-10-CM | POA: Diagnosis not present

## 2011-12-11 DIAGNOSIS — I214 Non-ST elevation (NSTEMI) myocardial infarction: Secondary | ICD-10-CM | POA: Diagnosis not present

## 2011-12-11 LAB — CBC
HCT: 33.2 % — ABNORMAL LOW (ref 36.0–46.0)
Hemoglobin: 10.5 g/dL — ABNORMAL LOW (ref 12.0–15.0)
MCH: 25.1 pg — ABNORMAL LOW (ref 26.0–34.0)
RBC: 4.18 MIL/uL (ref 3.87–5.11)

## 2011-12-11 LAB — CARDIAC PANEL(CRET KIN+CKTOT+MB+TROPI)
Relative Index: 0.4 (ref 0.0–2.5)
Total CK: 670 U/L — ABNORMAL HIGH (ref 7–177)
Troponin I: <0.3 ng/mL (ref <0.30)
Troponin I: <0.3 ng/mL (ref <0.30)

## 2011-12-11 LAB — LIPID PANEL
Cholesterol: 159 mg/dL (ref 0–200)
HDL: 72 mg/dL (ref >39)
Total CHOL/HDL Ratio: 2.2 RATIO

## 2011-12-11 LAB — BASIC METABOLIC PANEL
CO2: 28 mEq/L (ref 19–32)
Glucose, Bld: 111 mg/dL — ABNORMAL HIGH (ref 70–99)
Potassium: 4.3 mEq/L (ref 3.5–5.1)
Sodium: 141 mEq/L (ref 135–145)

## 2011-12-11 MED ORDER — BIOTENE DRY MOUTH MT LIQD
15.0000 mL | Freq: Two times a day (BID) | OROMUCOSAL | Status: DC
  Administered 2011-12-11 – 2011-12-16 (×12): 15 mL via OROMUCOSAL

## 2011-12-11 MED ORDER — PNEUMOCOCCAL VAC POLYVALENT 25 MCG/0.5ML IJ INJ
0.5000 mL | INJECTION | INTRAMUSCULAR | Status: AC
  Filled 2011-12-11: qty 0.5

## 2011-12-11 NOTE — Progress Notes (Addendum)
Pt c/o 5/10 chest pain, midsternal.  Unable to describe except states "it's constant".  Denies radiation, denies SOB/ dizziness.  EKG done, pt states pain now a 3.  [facility] PA notified.  Order to give SL NTG for CP.  First NTG given at 1740hrs.  Pt given second NTG at 1745hrs for continued CP rating 3/10, pain rated 1/10 after 5 more minutes,  No further NTG given, BP 97/54, pt denies dizziness, will continue to monitor for changes.

## 2011-12-11 NOTE — Progress Notes (Signed)
Patient unable to void despite several attempts to use the bedpan.patient very uncomfortable.PA on call informed and foley cath placed per order.600cc clear amber urine obtained.I will continue to closely monitor urine output.

## 2011-12-11 NOTE — Progress Notes (Signed)
[facility]  DAILY PROGRESS NOTE   Subjective:  Urinary retention noted overnight. 600 CC out with foley catheter. She can't tell a difference in her breathing.  Objective:  Temp:  [97.9 F (36.6 C)-98.7 F (37.1 C)] 98.7 F (37.1 C) (03/20 1128) Pulse Rate:  [69-115] 73  (03/20 1128) Resp:  [14-26] 18  (03/20 1128) BP: (83-137)/(32-76) 83/32 mmHg (03/20 1128) SpO2:  [95 %-98 %] 95 % (03/20 1128) Weight:  [86.8 kg (191 lb 5.8 oz)-87.3 kg (192 lb 7.4 oz)] 87.3 kg (192 lb 7.4 oz) (03/20 0140) Weight change:   Intake/Output from previous day: 03/19 0701 - 03/20 0700 In: 240 [P.O.:240] Out: 1100 [Urine:1100]  Intake/Output from this shift: Total I/O In: 344 [P.O.:340; IV Piggyback:4] Out: 1450 [Urine:1450]  Medications: Current Facility-Administered Medications  Medication Dose Route Frequency Provider Last Rate Last Dose  . acetaminophen (TYLENOL) tablet 650 mg  650 mg Oral Q4H PRN Dwana Melena, PA   650 mg at 12/11/11 0829  . albuterol (PROVENTIL) (5 MG/ML) 0.5% nebulizer solution 2.5 mg  2.5 mg Nebulization Q6H PRN Dwana Melena, PA      . antiseptic oral rinse (BIOTENE) solution 15 mL  15 mL Mouth Rinse BID Runell Gess, MD   15 mL at 12/11/11 0800  . aspirin chewable tablet 324 mg  324 mg Oral Once Otilio Miu, PA   324 mg at 12/10/11 1432  . aspirin EC tablet 81 mg  81 mg Oral Daily Dwana Melena, PA   81 mg at 12/11/11 0948  . carbidopa-levodopa (SINEMET CR) 50-200 MG per tablet 1 tablet  1 tablet Oral QHS Dwana Melena, PA   1 tablet at 12/10/11 2347  . carbidopa-levodopa (SINEMET) 25-100 MG per tablet 2 tablet  2 tablet Oral TID WC Dwana Melena, PA   2 tablet at 12/11/11 1312  . famotidine (PEPCID) IVPB 20 mg  20 mg Intravenous Once Otilio Miu, PA   20 mg at 12/10/11 1404  . ferrous sulfate tablet 325 mg  325 mg Oral Daily Dwana Melena, [place]   325 mg at 12/11/11 0948  . furosemide (LASIX) injection 40 mg  40 mg  Intravenous BID Dwana Melena, PA   40 mg at 12/11/11 1610  . gi cocktail (Maalox,Lidocaine,Donnatal)  30 mL Oral Once Otilio Miu, PA   30 mL at 12/10/11 1400  . levofloxacin (LEVAQUIN) tablet 500 mg  500 mg Oral q1800 Dwana Melena, PA      . magnesium oxide (MAG-OX) tablet 400 mg  400 mg Oral BID Dwana Melena, PA   400 mg at 12/11/11 0948  . metoprolol tartrate (LOPRESSOR) tablet 12.5 mg  12.5 mg Oral BID Dwana Melena, PA   12.5 mg at 12/10/11 2346  . nitroGLYCERIN (NITROGLYN) 2 % ointment 1 inch  1 inch Topical Once Otilio Miu, PA   1 inch at 12/10/11 1443  . nitroGLYCERIN (NITROSTAT) SL tablet 0.4 mg  0.4 mg Sublingual Q5 Min x 3 PRN Dwana Melena, PA      . ondansetron [facility]) injection 4 mg  4 mg Intravenous Q6H PRN Dwana Melena, PA      . pneumococcal 23 valent vaccine (PNU-IMMUNE) injection 0.5 mL  0.5 mL Intramuscular Tomorrow-1000 Runell Gess, MD      . potassium chloride SA (K-DUR,KLOR-CON) CR tablet 20 mEq  20 mEq Oral QODAY Dwana Melena, PA      .  pramipexole (MIRAPEX) tablet 1.5 mg  1.5 mg Oral QID Dwana Melena, PA   1.5 mg at 12/11/11 1355  . senna-docusate (Senokot-S) tablet 2 tablet  2 tablet Oral QHS Runell Gess, MD   2 tablet at 12/10/11 2345  . sodium chloride (OCEAN) 0.65 % nasal spray 1 spray  1 spray Each Nare PRN Runell Gess, MD      . zolpidem [facility]) tablet 5 mg  5 mg Oral QHS PRN Dwana Melena, PA   5 mg at 12/10/11 2110  . DISCONTD: nitroGLYCERIN 0.2 mg/mL in dextrose 5 % infusion  5-200 mcg/min Intravenous Once Otilio Miu, PA      . DISCONTD: sennosides-docusate sodium (SENOKOT-S) 8.6-50 MG tablet 2 tablet  2 tablet Oral Daily Dwana Melena, PA      . DISCONTD: sodium chloride (OCEAN) 0.65 % nasal spray 1 spray  1 spray Each Nare PRN Dwana Melena, PA        Physical Exam: General appearance: alert and no distress Neck: no adenopathy, no carotid bruit, no JVD, supple, symmetrical, trachea midline and thyroid  not enlarged, symmetric, no tenderness/mass/nodules Lungs: wheezes bilaterally Heart: regular rate and rhythm, S1, S2 normal, no murmur, click, rub or gallop Abdomen: soft, non-tender; bowel sounds normal; no masses,  no organomegaly Extremities: extremities normal, atraumatic, no cyanosis or edema Pulses: 2+ and symmetric  Lab Results: Results for orders placed during the hospital encounter of 12/10/11 (from the past 48 hour(s))  GLUCOSE, CAPILLARY     Status: Abnormal   Collection Time   12/10/11 10:33 AM      Component Value Range Comment   Glucose-Capillary 104 (*) 70 - 99 (mg/dL)   OCCULT BLOOD, POC DEVICE     Status: Normal   Collection Time   12/10/11 11:13 AM      Component Value Range Comment   Fecal Occult Bld NEGATIVE     CBC     Status: Abnormal   Collection Time   12/10/11 11:20 AM      Component Value Range Comment   WBC 10.0  4.0 - 10.5 (K/uL)    RBC 4.43  3.87 - 5.11 (MIL/uL)    Hemoglobin 11.1 (*) 12.0 - 15.0 (g/dL)    HCT 16.1 (*) 09.6 - 46.0 (%)    MCV 79.9  78.0 - 100.0 (fL)    MCH 25.1 (*) 26.0 - 34.0 (pg)    MCHC 31.4  30.0 - 36.0 (g/dL)    RDW 04.5 (*) 40.9 - 15.5 (%)    Platelets 138 (*) 150 - 400 (K/uL) PLATELET COUNT CONFIRMED BY SMEAR  DIFFERENTIAL     Status: Normal   Collection Time   12/10/11 11:20 AM      Component Value Range Comment   Neutrophils Relative 73  43 - 77 (%)    Neutro Abs 7.3  1.7 - 7.7 (K/uL)    Lymphocytes Relative 15  12 - 46 (%)    Lymphs Abs 1.5  0.7 - 4.0 (K/uL)    Monocytes Relative 8  3 - 12 (%)    Monocytes Absolute 0.8  0.1 - 1.0 (K/uL)    Eosinophils Relative 4  0 - 5 (%)    Eosinophils Absolute 0.4  0.0 - 0.7 (K/uL)    Basophils Relative 1  0 - 1 (%)    Basophils Absolute 0.1  0.0 - 0.1 (K/uL)   BASIC METABOLIC PANEL     Status: Abnormal   Collection  Time   12/10/11 11:20 AM      Component Value Range Comment   Sodium 137  135 - 145 (mEq/L)    Potassium 3.8  3.5 - 5.1 (mEq/L)    Chloride 103  96 - 112 (mEq/L)     CO2 25  19 - 32 (mEq/L)    Glucose, Bld 109 (*) 70 - 99 (mg/dL)    BUN 13  6 - 23 (mg/dL)    Creatinine, Ser 9.60  0.50 - 1.10 (mg/dL)    Calcium 8.9  8.4 - 10.5 (mg/dL)    GFR calc non Af Amer 86 (*) >90 (mL/min)    GFR calc Af Amer >90  >90 (mL/min)   URINALYSIS, ROUTINE W REFLEX MICROSCOPIC     Status: Normal   Collection Time   12/10/11 12:51 PM      Component Value Range Comment   Color, Urine YELLOW  YELLOW     APPearance CLEAR  CLEAR     Specific Gravity, Urine 1.016  1.005 - 1.030     pH 6.0  5.0 - 8.0     Glucose, UA NEGATIVE  NEGATIVE (mg/dL)    Hgb urine dipstick NEGATIVE  NEGATIVE     Bilirubin Urine NEGATIVE  NEGATIVE     Ketones, ur NEGATIVE  NEGATIVE (mg/dL)    Protein, ur NEGATIVE  NEGATIVE (mg/dL)    Urobilinogen, UA 1.0  0.0 - 1.0 (mg/dL)    Nitrite NEGATIVE  NEGATIVE     Leukocytes, UA NEGATIVE  NEGATIVE  MICROSCOPIC NOT DONE ON URINES WITH NEGATIVE PROTEIN, BLOOD, LEUKOCYTES, NITRITE, OR GLUCOSE <1000 mg/dL.  POCT I-STAT TROPONIN I     Status: Normal   Collection Time   12/10/11  2:00 PM      Component Value Range Comment   Troponin i, poc 0.01  0.00 - 0.08 (ng/mL)    Comment 3            MRSA PCR SCREENING     Status: Normal   Collection Time   12/10/11  8:41 PM      Component Value Range Comment   MRSA by PCR NEGATIVE  NEGATIVE    CARDIAC PANEL(CRET KIN+CKTOT+MB+TROPI)     Status: Abnormal   Collection Time   12/10/11  8:44 PM      Component Value Range Comment   Total CK 334 (*) 7 - 177 (U/L)    CK, MB 2.4  0.3 - 4.0 (ng/mL)    Troponin I <0.30  <0.30 (ng/mL)    Relative Index 0.7  0.0 - 2.5    D-DIMER, QUANTITATIVE     Status: Abnormal   Collection Time   12/10/11  8:44 PM      Component Value Range Comment   D-Dimer, Quant 0.53 (*) 0.00 - 0.48 (ug/mL-FEU)   CARDIAC PANEL(CRET KIN+CKTOT+MB+TROPI)     Status: Abnormal   Collection Time   12/11/11  2:20 AM      Component Value Range Comment   Total CK 670 (*) 7 - 177 (U/L)    CK, MB 2.5  0.3 - 4.0  (ng/mL)    Troponin I <0.30  <0.30 (ng/mL)    Relative Index 0.4  0.0 - 2.5    CBC     Status: Abnormal   Collection Time   12/11/11  2:21 AM      Component Value Range Comment   WBC 12.6 (*) 4.0 - 10.5 (K/uL)    RBC 4.18  3.87 - 5.11 (  MIL/uL)    Hemoglobin 10.5 (*) 12.0 - 15.0 (g/dL)    HCT 16.1 (*) 09.6 - 46.0 (%)    MCV 79.4  78.0 - 100.0 (fL)    MCH 25.1 (*) 26.0 - 34.0 (pg)    MCHC 31.6  30.0 - 36.0 (g/dL)    RDW 04.5 (*) 40.9 - 15.5 (%)    Platelets 142 (*) 150 - 400 (K/uL)   BASIC METABOLIC PANEL     Status: Abnormal   Collection Time   12/11/11  2:21 AM      Component Value Range Comment   Sodium 141  135 - 145 (mEq/L)    Potassium 4.3  3.5 - 5.1 (mEq/L)    Chloride 107  96 - 112 (mEq/L)    CO2 28  19 - 32 (mEq/L)    Glucose, Bld 111 (*) 70 - 99 (mg/dL)    BUN 11  6 - 23 (mg/dL)    Creatinine, Ser 8.11  0.50 - 1.10 (mg/dL)    Calcium 8.5  8.4 - 10.5 (mg/dL)    GFR calc non Af Amer 84 (*) >90 (mL/min)    GFR calc Af Amer >90  >90 (mL/min)   LIPID PANEL     Status: Normal   Collection Time   12/11/11  2:21 AM      Component Value Range Comment   Cholesterol 159  0 - 200 (mg/dL)    Triglycerides 44  <914 (mg/dL)    HDL 72  >78 (mg/dL)    Total CHOL/HDL Ratio 2.2      VLDL 9  0 - 40 (mg/dL)    LDL Cholesterol 78  0 - 99 (mg/dL)   CARDIAC PANEL(CRET KIN+CKTOT+MB+TROPI)     Status: Abnormal   Collection Time   12/11/11  7:59 AM      Component Value Range Comment   Total CK 722 (*) 7 - 177 (U/L)    CK, MB 2.7  0.3 - 4.0 (ng/mL)    Troponin I <0.30  <0.30 (ng/mL)    Relative Index 0.4  0.0 - 2.5      Imaging: Dg Chest 2 View  12/10/2011  *RADIOLOGY REPORT*  Clinical Data: Chest pain and fall.  CHEST - 2 VIEW  Comparison: Chest radiograph 09/26/2011  Findings: The heart, mediastinal, and hilar contours are stable. Heart size remains prominent.  There is pulmonary vascular congestion.  Low lung volumes with left basilar atelectasis.  No visible pleural effusion or  pneumothorax.  Surgical clips in the right neck.  No displaced rib fracture is identified.  IMPRESSION: Stable cardiomegaly with pulmonary vascular congestion.  Low lung volumes and left basilar atelectasis.  Original Report Authenticated By: Britta Mccreedy, M.D.   Dg Hip Complete Left  12/10/2011  *RADIOLOGY REPORT*  Clinical Data: Fall.  Left buttock region pain and swelling.  LEFT HIP - COMPLETE 2+ VIEW  Comparison: 06/06/2009  Findings: Right hip hemiarthroplasty noted.  No hip fracture or regional pelvic fracture is observed.  Spurring of the greater trochanter noted.  IMPRESSION:  1.  No acute bony findings.  Original Report Authenticated By: Dellia Cloud, M.D.    Assessment:  1. Principal Problem: 2.  *Syncope and collapse 3. Active Problems: 4.  Parkinson's disease 5.  HTN (hypertension) 6.  GI bleed: January 2013 7.  Chest pain 8.  Dyslipidemia 9.  Cor pulmonale 10.   Plan:  1. She is diuresing well. Did have urinary retention relieved with a foley catheter. Some hypotension  this am - bp meds were held and it has improved. Lower extremity cellulitis .Marland Kitchen Now on levaquin. I marked the margins of her erythema today. Will check BNP in the am. Continue IV diuretics today and consider transferring to po diuretics tomorrow.  Time Spent Directly with Patient:  15 minutes  Length of Stay:  LOS: 1 day   Chrystie Nose, MD, [facility] Attending Cardiologist [facility]  Peni Rupard C 12/11/2011, 1:58 PM

## 2011-12-12 DIAGNOSIS — I5033 Acute on chronic diastolic (congestive) heart failure: Secondary | ICD-10-CM | POA: Diagnosis not present

## 2011-12-12 DIAGNOSIS — I214 Non-ST elevation (NSTEMI) myocardial infarction: Secondary | ICD-10-CM | POA: Diagnosis not present

## 2011-12-12 DIAGNOSIS — R9431 Abnormal electrocardiogram [ECG] [EKG]: Secondary | ICD-10-CM | POA: Diagnosis present

## 2011-12-12 DIAGNOSIS — R0789 Other chest pain: Secondary | ICD-10-CM | POA: Diagnosis not present

## 2011-12-12 DIAGNOSIS — J189 Pneumonia, unspecified organism: Secondary | ICD-10-CM | POA: Diagnosis not present

## 2011-12-12 DIAGNOSIS — L02419 Cutaneous abscess of limb, unspecified: Secondary | ICD-10-CM | POA: Diagnosis not present

## 2011-12-12 LAB — BASIC METABOLIC PANEL
BUN: 14 mg/dL (ref 6–23)
Calcium: 8.9 mg/dL (ref 8.4–10.5)
Creatinine, Ser: 0.74 mg/dL (ref 0.50–1.10)
GFR calc non Af Amer: 84 mL/min — ABNORMAL LOW (ref >90)
Glucose, Bld: 90 mg/dL (ref 70–99)

## 2011-12-12 LAB — CBC
MCH: 25.3 pg — ABNORMAL LOW (ref 26.0–34.0)
MCHC: 31.7 g/dL (ref 30.0–36.0)
Platelets: 159 10*3/uL (ref 150–400)

## 2011-12-12 MED ORDER — FUROSEMIDE 40 MG PO TABS
40.0000 mg | ORAL_TABLET | Freq: Every day | ORAL | Status: DC
  Administered 2011-12-13 – 2011-12-14 (×2): 40 mg via ORAL
  Filled 2011-12-12 (×2): qty 1

## 2011-12-12 MED ORDER — PANTOPRAZOLE SODIUM 40 MG PO TBEC
40.0000 mg | DELAYED_RELEASE_TABLET | Freq: Every day | ORAL | Status: DC
  Administered 2011-12-13 – 2011-12-15 (×3): 40 mg via ORAL
  Filled 2011-12-12 (×3): qty 1

## 2011-12-12 MED ORDER — KETOROLAC TROMETHAMINE 30 MG/ML IJ SOLN
30.0000 mg | Freq: Four times a day (QID) | INTRAMUSCULAR | Status: AC
  Administered 2011-12-12 (×2): 30 mg via INTRAVENOUS
  Filled 2011-12-12 (×2): qty 1

## 2011-12-12 MED ORDER — ISOSORBIDE MONONITRATE ER 30 MG PO TB24
30.0000 mg | ORAL_TABLET | Freq: Every day | ORAL | Status: DC
  Administered 2011-12-12 – 2011-12-14 (×3): 30 mg via ORAL
  Filled 2011-12-12 (×4): qty 1

## 2011-12-12 NOTE — ED Provider Notes (Signed)
I saw and evaluated the patient, reviewed the resident's note and I agree with the findings and plan.  Denyce Harr, MD 12/12/11 1434 

## 2011-12-12 NOTE — Progress Notes (Signed)
Subjective:  Chest pain, "pressure" yesterday, better after NTG X 2. No chest pain today.  Objective:  Vital Signs in the last 24 hours: Temp:  [98.2 F (36.8 C)-98.8 F (37.1 C)] 98.6 F (37 C) (03/21 1257) Pulse Rate:  [69-82] 70  (03/21 1257) Resp:  [18-24] 18  (03/21 1257) BP: (80-135)/(41-103) 94/48 mmHg (03/21 1257) SpO2:  [92 %-99 %] 93 % (03/21 1257) Weight:  [82 kg (180 lb 12.4 oz)] 82 kg (180 lb 12.4 oz) (03/21 0041)  Intake/Output from previous day:  Intake/Output Summary (Last 24 hours) at 12/12/11 1435 Last data filed at 12/12/11 1258  Gross per 24 hour  Intake    488 ml  Output   3775 ml  Net  -3287 ml      . antiseptic oral rinse  15 mL Mouth Rinse BID  . aspirin EC  81 mg Oral Daily  . carbidopa-levodopa  1 tablet Oral QHS  . carbidopa-levodopa  2 tablet Oral TID WC  . ferrous sulfate  325 mg Oral Daily  . furosemide  40 mg Oral Daily  . isosorbide mononitrate  30 mg Oral Daily  . levofloxacin  500 mg Oral q1800  . magnesium oxide  400 mg Oral BID  . metoprolol tartrate  12.5 mg Oral BID  . pantoprazole  40 mg Oral Q0600  . pneumococcal 23 valent vaccine  0.5 mL Intramuscular Tomorrow-1000  . potassium chloride SA  20 mEq Oral QODAY  . pramipexole  1.5 mg Oral QID  . senna-docusate  2 tablet Oral QHS  . DISCONTD: furosemide  40 mg Intravenous BID    Physical Exam: General appearance: alert, cooperative, no distress and Parkinsonian No JVD Definite chest wall tenderness over costochondral regions bilaterally which mimics her discomfort Lungs: decreased breath sounds Heart: regular rate and rhythm  No rub Abd;  soft Extrem: chronic skin changes   Rate: 78  Rhythm: normal sinus rhythm  Lab Results:  Basename 12/12/11 0425 12/11/11 0221  WBC 10.9* 12.6*  HGB 11.2* 10.5*  PLT 159 142*    Basename 12/12/11 0425 12/11/11 0221  NA 137 141  K 4.0 4.3  CL 100 107  CO2 28 28  GLUCOSE 90 111*  BUN 14 11  CREATININE 0.74 0.72    Basename  12/11/11 0759 12/11/11 0220  TROPONINI <0.30 <0.30   Hepatic Function Panel No results found for this basename: PROT,ALBUMIN,AST,ALT,ALKPHOS,BILITOT,BILIDIR,IBILI in the last 72 hours  Basename 12/11/11 0221  CHOL 159   No results found for this basename: INR in the last 72 hours  Imaging: Imaging results have been reviewed  Cardiac Studies:  Assessment/Plan:   Principal Problem:  *Syncope and collapse, no arrythmia since admission  Active Problems:  Chest pain. This could be angina, Troponin negative  Parkinson's disease  Cor pulmonale  Abnormal EKG,(TWI lead 3 on admission)  HTN (hypertension)  GI bleed: January 2013, Hgb stable  Dyslipidemia  Plan- difficult situation, her symptoms could be angina but EKG and Troponin are negative. She could not tolerate Myoview and I doubt she could tolerate even a radial cath secondary to Parkinsons. For now medical therapy. Will continue beta blocker (new) add po nitrate, change Lasix to po (? CHF on admission) check BNP in am. She is on Levaquin for possible CAP and we will continue this (elevated WBC on admission and ATX on CXR) Md to see.    Corine Shelter PA-C 12/12/2011, 2:35 PM   Patient seen and examined. Agree with assessment and plan.  Pt describes her pain as "constant ache."  She has definitive tenderness over costochondral regions bilaterally suggestive of musculoskeletal etiology. Will give Toradol trial.  Also obtain 2d echo.  ECG is without significant changes. Repeat ECG in am. Doubt ischemic pain.  Lennette Bihari, MD, [facility] 12/12/2011 4:28 PM

## 2011-12-12 NOTE — Clinical Documentation Improvement (Signed)
GENERIC DOCUMENTATION CLARIFICATION QUERY  THIS DOCUMENT IS NOT A PERMANENT PART OF THE MEDICAL RECORD  TO RESPOND TO THE THIS QUERY, FOLLOW THE INSTRUCTIONS BELOW:  1. If needed, update documentation for the patient's encounter via the notes activity.  2. Access this query again and click edit on the In [employer].  3. After updating, or not, click F2 to complete all highlighted (required) fields concerning your review. Select "additional documentation in the medical record" OR "no additional documentation provided".  4. Click Sign note button.  5. The deficiency will fall out of your In Basket *Please let us know if you are not able to complete this workflow by phone or e-mail (listed below).  Please update your documentation within the medical record to reflect your response to this query.                                                                                        12/12/11   Dear Dr. Corine Shelter and  Associates,  In a better effort to capture your patient's severity of illness, reflect appropriate length of stay and utilization of resources, a review of the patient medical record has revealed the following indicators.    Based on your clinical judgment, please clarify and document in a progress note and/or discharge summary the clinical condition associated with the following supporting information:  In responding to this query please exercise your independent judgment.  The fact that a query is asked, does not imply that any particular answer is desired or expected.   In a better effort to capture your patient's severity of illness, please clarify the acuity of Cor Pulmonale that has been frequently documented in the medical record.  Possible Clinical Conditions  _______Cor Pulmonale (Acute/Chronic) _______Other Condition _______Cannot Clinically Determine    You may use possible, probable, or suspect with inpatient documentation. possible, probable, suspected  diagnoses MUST be documented at the time of discharge   Reviewed:  Clarification documented  Thank You,    Rossie Muskrat RN, BSN  Clinical Documentation Specialist Pager:  [phone] Param [email or web address]  Health Information Management [place]

## 2011-12-13 ENCOUNTER — Inpatient Hospital Stay (HOSPITAL_COMMUNITY): Payer: Medicare Other

## 2011-12-13 DIAGNOSIS — J189 Pneumonia, unspecified organism: Secondary | ICD-10-CM | POA: Diagnosis not present

## 2011-12-13 DIAGNOSIS — R079 Chest pain, unspecified: Secondary | ICD-10-CM | POA: Diagnosis not present

## 2011-12-13 DIAGNOSIS — I509 Heart failure, unspecified: Secondary | ICD-10-CM | POA: Diagnosis not present

## 2011-12-13 DIAGNOSIS — I214 Non-ST elevation (NSTEMI) myocardial infarction: Secondary | ICD-10-CM | POA: Diagnosis not present

## 2011-12-13 DIAGNOSIS — J9819 Other pulmonary collapse: Secondary | ICD-10-CM | POA: Diagnosis not present

## 2011-12-13 DIAGNOSIS — I5033 Acute on chronic diastolic (congestive) heart failure: Secondary | ICD-10-CM | POA: Diagnosis not present

## 2011-12-13 DIAGNOSIS — R0602 Shortness of breath: Secondary | ICD-10-CM | POA: Diagnosis not present

## 2011-12-13 DIAGNOSIS — L02419 Cutaneous abscess of limb, unspecified: Secondary | ICD-10-CM | POA: Diagnosis not present

## 2011-12-13 DIAGNOSIS — R55 Syncope and collapse: Secondary | ICD-10-CM | POA: Diagnosis not present

## 2011-12-13 DIAGNOSIS — R0789 Other chest pain: Secondary | ICD-10-CM | POA: Diagnosis not present

## 2011-12-13 DIAGNOSIS — R05 Cough: Secondary | ICD-10-CM | POA: Diagnosis not present

## 2011-12-13 LAB — CBC
MCH: 24.8 pg — ABNORMAL LOW (ref 26.0–34.0)
Platelets: 166 10*3/uL (ref 150–400)
RBC: 4.19 MIL/uL (ref 3.87–5.11)
WBC: 9.1 10*3/uL (ref 4.0–10.5)

## 2011-12-13 LAB — BASIC METABOLIC PANEL
Calcium: 9.1 mg/dL (ref 8.4–10.5)
GFR calc non Af Amer: 71 mL/min — ABNORMAL LOW (ref >90)
Glucose, Bld: 95 mg/dL (ref 70–99)
Sodium: 136 mEq/L (ref 135–145)

## 2011-12-13 LAB — PRO B NATRIURETIC PEPTIDE: Pro B Natriuretic peptide (BNP): 279.3 pg/mL — ABNORMAL HIGH (ref 0–125)

## 2011-12-13 MED ORDER — ENOXAPARIN SODIUM 80 MG/0.8ML ~~LOC~~ SOLN: 1.0000 mg/kg | SUBCUTANEOUS | Status: DC

## 2011-12-13 MED ORDER — ALBUTEROL SULFATE (5 MG/ML) 0.5% IN NEBU
2.5000 mg | INHALATION_SOLUTION | Freq: Four times a day (QID) | RESPIRATORY_TRACT | Status: DC
  Administered 2011-12-13 – 2011-12-17 (×15): 2.5 mg via RESPIRATORY_TRACT
  Filled 2011-12-13 (×16): qty 0.5

## 2011-12-13 MED ORDER — ENOXAPARIN SODIUM 40 MG/0.4ML ~~LOC~~ SOLN
40.0000 mg | SUBCUTANEOUS | Status: DC
  Administered 2011-12-13 – 2011-12-16 (×4): 40 mg via SUBCUTANEOUS
  Filled 2011-12-13 (×5): qty 0.4

## 2011-12-13 NOTE — Progress Notes (Signed)
UR Completed. [name]

## 2011-12-13 NOTE — Progress Notes (Addendum)
Subjective:  No chest pain today. Breathing is better; just weak.  Objective:  Vital Signs in the last 24 hours: Temp:  [97.4 F (36.3 C)-98.5 F (36.9 C)] 98.5 F (36.9 C) (03/22 1300) Pulse Rate:  [59-74] 59  (03/22 1300) Resp:  [16-26] 21  (03/22 1300) BP: (101-113)/(50-94) 110/50 mmHg (03/22 1300) SpO2:  [93 %-98 %] 97 % (03/22 1300) Weight:  [80 kg (176 lb 5.9 oz)] 80 kg (176 lb 5.9 oz) (03/22 0010)  Intake/Output from previous day:  Intake/Output Summary (Last 24 hours) at 12/13/11 1517 Last data filed at 12/13/11 1330  Gross per 24 hour  Intake    120 ml  Output   1050 ml  Net   -930 ml      . albuterol  2.5 mg Nebulization Q6H  . antiseptic oral rinse  15 mL Mouth Rinse BID  . aspirin EC  81 mg Oral Daily  . carbidopa-levodopa  1 tablet Oral QHS  . carbidopa-levodopa  2 tablet Oral TID WC  . enoxaparin (LOVENOX) injection  1 mg/kg Subcutaneous Q24H  . ferrous sulfate  325 mg Oral Daily  . furosemide  40 mg Oral Daily  . isosorbide mononitrate  30 mg Oral Daily  . ketorolac  30 mg Intravenous Q6H  . levofloxacin  500 mg Oral q1800  . magnesium oxide  400 mg Oral BID  . metoprolol tartrate  12.5 mg Oral BID  . pantoprazole  40 mg Oral Q0600  . pneumococcal 23 valent vaccine  0.5 mL Intramuscular Tomorrow-1000  . potassium chloride SA  20 mEq Oral QODAY  . pramipexole  1.5 mg Oral QID  . senna-docusate  2 tablet Oral QHS    Physical Exam: General appearance: alert, cooperative, no distress and Parkinsonian tremor. No JVD Definite chest wall tenderness over costochondral regions bilaterally which mimics her discomfort - not as noticeable today Lungs: decreased breath sounds with course rhonchi bilaterally. Heart: regular rate and rhythm  No rub Abd;  Soft, NT/ND/NABS Extrem: chronic skin changes -- still red & warm bilateral LE.     Rate: 78  Rhythm: normal sinus rhythm  Lab Results:  Basename 12/13/11 0425 12/12/11 0425  WBC 9.1 10.9*  HGB 10.4*  11.2*  PLT 166 159    Basename 12/13/11 0425 12/12/11 0425  NA 136 137  K 4.3 4.0  CL 101 100  CO2 29 28  GLUCOSE 95 90  BUN 23 14  CREATININE 0.81 0.74    Basename 12/11/11 0759 12/11/11 0220  TROPONINI <0.30 <0.30   Hepatic Function Panel No results found for this basename: PROT,ALBUMIN,AST,ALT,ALKPHOS,BILITOT,BILIDIR,IBILI in the last 72 hours  Basename 12/11/11 0221  CHOL 159   No results found for this basename: INR in the last 72 hours  Imaging: Imaging results have been reviewed  Cardiac Studies:  Assessment/Plan:   Principal Problem:  *Syncope and collapse, no arrythmia since admission  Active Problems:  Chest pain. unlikely angina, Troponin negative  - likely MSK, no further CP today.  Parkinson's disease  Cor pulmonale  Abnormal EKG,(TWI lead 3 on admission)  HTN (hypertension)  GI bleed: January 2013, Hgb stable  Dyslipidemia   ECG is without significant changes - looks normal today; formal read pending. Repeat ECG in am. Doubt ischemic pain.  Echocardiogram ordered.  Difficult situation, her symptoms could be angina but EKG and Troponin are negative -- most likely MSK Sx.    She could not tolerate Myoview and I doubt she could tolerate even a radial  cath secondary to Parkinsons. For now medical therapy. Will continue beta blocker (new) add po nitrate, change Lasix to po (? CHF on admission) check BNP in am.   She is on Levaquin for possible CAP & also for bilateral LE cellulitis and we will continue this (elevated WBC on admission and ATX on CXR -- WBC continues to decline & Afebrile) -- will f/u CXR today. LLE cellulits - still red & warm but Edema much improved.    Still diuresing with PO diuretics.  Needs PT/OT eval - may require OP assistance; wants to go home  ON PD meds.  VTE prophylaxis - SQ Lovenox.  Marykay Lex, M.D., M.S. [facility] [address] [place], [place]   [address]  [phone] Pager # [phone]  12/13/2011 3:18 PM

## 2011-12-13 NOTE — Progress Notes (Signed)
  Echocardiogram 2D Echocardiogram has been performed.  Cathie Beams Deneen 12/13/2011, 4:37 PM

## 2011-12-14 DIAGNOSIS — I509 Heart failure, unspecified: Secondary | ICD-10-CM | POA: Diagnosis not present

## 2011-12-14 DIAGNOSIS — R55 Syncope and collapse: Secondary | ICD-10-CM | POA: Diagnosis not present

## 2011-12-14 LAB — CBC
HCT: 33.9 % — ABNORMAL LOW (ref 36.0–46.0)
Hemoglobin: 10.6 g/dL — ABNORMAL LOW (ref 12.0–15.0)
MCH: 25.2 pg — ABNORMAL LOW (ref 26.0–34.0)
MCV: 80.5 fL (ref 78.0–100.0)
RBC: 4.21 MIL/uL (ref 3.87–5.11)
WBC: 7.8 10*3/uL (ref 4.0–10.5)

## 2011-12-14 LAB — BASIC METABOLIC PANEL
CO2: 29 mEq/L (ref 19–32)
Chloride: 101 mEq/L (ref 96–112)
Glucose, Bld: 105 mg/dL — ABNORMAL HIGH (ref 70–99)
Sodium: 137 mEq/L (ref 135–145)

## 2011-12-14 MED ORDER — METOPROLOL TARTRATE 25 MG PO TABS
25.0000 mg | ORAL_TABLET | Freq: Two times a day (BID) | ORAL | Status: DC
  Administered 2011-12-14 – 2011-12-17 (×6): 25 mg via ORAL
  Filled 2011-12-14 (×7): qty 1

## 2011-12-14 MED ORDER — FUROSEMIDE 20 MG PO TABS
20.0000 mg | ORAL_TABLET | Freq: Every day | ORAL | Status: DC
  Administered 2011-12-15 – 2011-12-17 (×3): 20 mg via ORAL
  Filled 2011-12-14 (×3): qty 1

## 2011-12-14 NOTE — Progress Notes (Signed)
Subjective:  No chest pain today. Breathing is better; feels stronger today. Legs feel better.  Objective:  Vital Signs in the last 24 hours: Temp:  [97.6 F (36.4 C)-98.6 F (37 C)] 97.6 F (36.4 C) (03/23 0759) Pulse Rate:  [59-86] 86  (03/23 1024) Resp:  [19-25] 20  (03/23 0759) BP: (99-139)/(48-65) 107/56 mmHg (03/23 1024) SpO2:  [92 %-100 %] 94 % (03/23 0759) Weight:  [81 kg (178 lb 9.2 oz)] 81 kg (178 lb 9.2 oz) (03/23 0500)  Intake/Output from previous day:  Intake/Output Summary (Last 24 hours) at 12/14/11 1142 Last data filed at 12/14/11 1000  Gross per 24 hour  Intake    600 ml  Output   1500 ml  Net   -900 ml      . albuterol  2.5 mg Nebulization Q6H  . antiseptic oral rinse  15 mL Mouth Rinse BID  . aspirin EC  81 mg Oral Daily  . carbidopa-levodopa  1 tablet Oral QHS  . carbidopa-levodopa  2 tablet Oral TID WC  . enoxaparin (LOVENOX) injection  40 mg Subcutaneous Q24H  . ferrous sulfate  325 mg Oral Daily  . furosemide  40 mg Oral Daily  . isosorbide mononitrate  30 mg Oral Daily  . levofloxacin  500 mg Oral q1800  . magnesium oxide  400 mg Oral BID  . metoprolol tartrate  12.5 mg Oral BID  . pantoprazole  40 mg Oral Q0600  . potassium chloride SA  20 mEq Oral QODAY  . pramipexole  1.5 mg Oral QID  . senna-docusate  2 tablet Oral QHS  . DISCONTD: enoxaparin (LOVENOX) injection  1 mg/kg Subcutaneous Q24H    Physical Exam: General appearance: alert, cooperative, no distress and Parkinsonian tremor.  A&O x 3, answers?s appropriately No JVD Lungs: decreased breath sounds with course rhonchi bilaterally. Heart: regular rate and rhythm  No rub Abd;  Soft, NT/ND/NABS Extrem: chronic skin changes -- still red & warm bilateral LE, but extent of erythema has decreased since yesterday.     Rate: 78  Rhythm: normal sinus rhythm  Lab Results:  Basename 12/14/11 0430 12/13/11 0425  WBC 7.8 9.1  HGB 10.6* 10.4*  PLT 173 166    Basename 12/14/11 0430  12/13/11 0425  NA 137 136  K 4.3 4.3  CL 101 101  CO2 29 29  GLUCOSE 105* 95  BUN 21 23  CREATININE 0.78 0.81    Imaging: Imaging results have been reviewed  Cardiac Studies: Echo reviewed. Normal EF; Grade 2 Diastolic Dysfunction & likely elevated PAP, dilated LA.  Assessment/Plan:   Principal Problem:  *Syncope and collapse, no arrythmia since admission; ? If due to infection; no clear etiology determined.  Active Problems:  Chest pain. unlikely angina, Troponin negative  - likely MSK, no further CP today.  Parkinson's disease  Cor pulmonale - by history; 2D echo unable to determine true PAP  Abnormal EKG,(TWI lead 3 on admission) - Echo without WMA.  HTN (hypertension)  GI bleed: January 2013, Hgb stable  Dyslipidemia Acute diastolic HF on admission - mostly resolved.    ECG is without significant changes - looks normal today; formal read pending. Repeat ECG in am. Doubt ischemic pain.  Echocardiogram with no obvious WMA & preserved EF - diastolic HF is likely etiology for SOB.   Marland Kitchen  Difficult situation, her symptoms could be angina but EKG and Troponin are negative -- most likely MSK Sx. Echo without WMA.  She could not tolerate Myoview  and I doubt she could tolerate even a radial cath secondary to Parkinsons.   For now medical therapy. Will continue beta blocker (new) add po nitrate, decrease dose of PO Lasix  (? CHF on admission mild BNP increase) check BNP in AM tomorrow (not done today).   Still diuresing with PO diuretics.   She is on Levaquin for possible CAP & also for bilateral LE cellulitis and we will continue this for 5-7 days (elevated WBC on admission and ATX on CXR -- WBC continues to decline & Afebrile) -- will f/u CXR today. LLE cellulits - less red & warm, Edema much improved.    Needs PT/OT eval - may require OP assistance; wants to go home; consults ordered along with SW consult.  ON PD meds.  VTE prophylaxis - SQ Lovenox.  [name],  M.D., M.S. [facility] [address] [place], [place]  [address]  [phone] Pager # [phone]  12/14/2011 11:42 AM

## 2011-12-15 DIAGNOSIS — R55 Syncope and collapse: Secondary | ICD-10-CM | POA: Diagnosis not present

## 2011-12-15 DIAGNOSIS — I509 Heart failure, unspecified: Secondary | ICD-10-CM | POA: Diagnosis not present

## 2011-12-15 DIAGNOSIS — J189 Pneumonia, unspecified organism: Secondary | ICD-10-CM | POA: Diagnosis present

## 2011-12-15 DIAGNOSIS — I5189 Other ill-defined heart diseases: Secondary | ICD-10-CM | POA: Diagnosis present

## 2011-12-15 LAB — BASIC METABOLIC PANEL
BUN: 19 mg/dL (ref 6–23)
CO2: 26 mEq/L (ref 19–32)
Calcium: 9.1 mg/dL (ref 8.4–10.5)
Chloride: 101 mEq/L (ref 96–112)
Creatinine, Ser: 0.72 mg/dL (ref 0.50–1.10)
Glucose, Bld: 103 mg/dL — ABNORMAL HIGH (ref 70–99)

## 2011-12-15 LAB — CBC
HCT: 33.8 % — ABNORMAL LOW (ref 36.0–46.0)
MCH: 25.5 pg — ABNORMAL LOW (ref 26.0–34.0)
MCHC: 32 g/dL (ref 30.0–36.0)
MCV: 79.7 fL (ref 78.0–100.0)
RDW: 18.8 % — ABNORMAL HIGH (ref 11.5–15.5)
WBC: 8.1 10*3/uL (ref 4.0–10.5)

## 2011-12-15 MED ORDER — ATORVASTATIN CALCIUM 10 MG PO TABS
10.0000 mg | ORAL_TABLET | Freq: Every day | ORAL | Status: DC
  Administered 2011-12-15 – 2011-12-16 (×2): 10 mg via ORAL
  Filled 2011-12-15 (×3): qty 1

## 2011-12-15 MED ORDER — PANTOPRAZOLE SODIUM 40 MG PO TBEC
40.0000 mg | DELAYED_RELEASE_TABLET | Freq: Every day | ORAL | Status: DC
  Administered 2011-12-15 – 2011-12-17 (×3): 40 mg via ORAL
  Filled 2011-12-15 (×3): qty 1

## 2011-12-15 MED ORDER — ISOSORBIDE MONONITRATE ER 60 MG PO TB24
60.0000 mg | ORAL_TABLET | Freq: Every day | ORAL | Status: DC
  Administered 2011-12-15 – 2011-12-17 (×3): 60 mg via ORAL
  Filled 2011-12-15 (×3): qty 1

## 2011-12-15 NOTE — Progress Notes (Signed)
CSW received consult for [name]. Per PA note, pt will need HHRN/PT at d/c. No other CSW needs identified. CSW signing off. Please re-consult if [name] needed.  Dellie Burns, MSW, [place] [phone] (weekend)

## 2011-12-15 NOTE — Progress Notes (Signed)
Physical Therapy Evaluation Patient Details Name: ASMI FUGERE MRN: [id number] DOB: Sep 21, 1941 Today's Date: 12/15/2011  Problem List:  Patient Active Problem List  Diagnoses  . Hematemesis  . Acute blood loss anemia  . Hypotension  . Leucocytosis  . Aspiration of blood  . Parkinson's disease  . HTN (hypertension)  . GI bleed: January 2013, Hgb stable, stool negative  . Gastric ulcer with hemorrhage  . Esophagitis, erosive  . Chest pain, MI R/O  . Cor pulmonale  . Syncope and collapse  . Abnormal EKG,(TWI lead 3 on one of 7 EKGs this admission)  . Diastolic dysfunction, grade 2  . CAP (community acquired pneumonia), possible CAP    Past Medical History:  Past Medical History  Diagnosis Date  . Parkinson's disease   . Hypertension   . Arthritis   . Anemia    Past Surgical History:  Past Surgical History  Procedure Date  . Hip arthroscopy w/ labral repair   . Thyroidectomy   . Esophagogastroduodenoscopy 09/26/2011    Procedure: ESOPHAGOGASTRODUODENOSCOPY (EGD);  Surgeon: Erick Blinks, MD;  [facility];  Service: Gastroenterology;  Laterality: N/A;  To be done at bedside.    PT Assessment/Plan/Recommendation PT Assessment Clinical Impression Statement: pt presents with syncope and fall.  pt with history Parkinson's and lives alone.  pt unsteady with mobility and noting that she often is unable to do chores at home.   PT Recommendation/Assessment: Patient will need skilled PT in the acute care venue PT Problem List: Decreased strength;Decreased activity tolerance;Decreased balance;Decreased mobility;Decreased knowledge of use of DME Barriers to Discharge: Decreased caregiver support PT Therapy Diagnosis : Difficulty walking;Generalized weakness PT Plan PT Frequency: Min 3X/week PT Treatment/Interventions: DME instruction;Gait training;Functional mobility training;Therapeutic activities;Therapeutic exercise;Balance training;Patient/family education PT  Recommendation Recommendations for Other Services: OT consult Follow Up Recommendations: Skilled nursing facility Equipment Recommended: Defer to next venue PT Goals  Acute Rehab PT Goals PT Goal Formulation: With patient Time For Goal Achievement: 2 weeks Pt will go Supine/Side to Sit: Independently PT Goal: Supine/Side to Sit - Progress: Goal set today Pt will go Sit to Supine/Side: Independently PT Goal: Sit to Supine/Side - Progress: Goal set today Pt will go Sit to Stand: with modified independence;with upper extremity assist PT Goal: Sit to Stand - Progress: Goal set today Pt will go Stand to Sit: with modified independence;with upper extremity assist PT Goal: Stand to Sit - Progress: Goal set today Pt will Ambulate: >150 feet;with modified independence;with rolling walker PT Goal: Ambulate - Progress: Goal set today  PT Evaluation Precautions/Restrictions  Precautions Precautions: Fall Restrictions Weight Bearing Restrictions: No Prior Functioning  Home Living Lives With: Alone Receives Help From: Family Type of Home: House Home Layout: One level Home Access: Ramped entrance Bathroom Shower/Tub: Engineer, manufacturing systems: Standard Bathroom Accessibility: Yes How Accessible: Accessible via walker Home Adaptive Equipment: Bedside commode/3-in-1;Reacher;Sock aid;Tub transfer bench;Walker - four wheeled Additional Comments: pt lives alone and notes brothergets her groceries for her and she has Meals on wheels for her hot meals.  pt notes she does not have any help cleaning and "it just gets put off for another time".  pt uses [employer] bus for transportation.   Prior Function Level of Independence: Independent with basic ADLs;Independent with gait;Independent with transfers;Requires assistive device for independence;Needs assistance with homemaking Able to Take Stairs?: No Driving: No Vocation: Retired Designer, television/film set Level: Oriented  X4 Sensation/Coordination   Extremity Assessment RLE Assessment RLE Assessment: Exceptions to [facility] RLE Strength RLE Overall Strength  Comments: pt with increased tone and difficulty flexing knee.  Grossly weak.   LLE Assessment LLE Assessment: Exceptions to [facility] LLE Strength LLE Overall Strength Comments: pt with increased tone Bil LEs and with decreased knee flexion.   Mobility (including Balance) Bed Mobility Bed Mobility: No Transfers Transfers: Yes Sit to Stand: 4: Min assist;With upper extremity assist;With armrests;From chair/3-in-1 Sit to Stand Details (indicate cue type and reason): cues for use of armrests and to scoot to edge of chair prior to sitting.   Stand to Sit: 4: Min assist;With upper extremity assist;With armrests;To chair/3-in-1 Stand to Sit Details: cues to get closer to chair and use armrests to control descent.   Ambulation/Gait Ambulation/Gait: Yes Ambulation/Gait Assistance: 4: Min assist Ambulation/Gait Assistance Details (indicate cue type and reason): cues and A for positioning in RW, RW use, upright posture, safety Ambulation Distance (Feet): 60 Feet Assistive device: Rolling walker Gait Pattern: Step-through pattern;Decreased stride length;Trunk flexed Stairs: No Wheelchair Mobility Wheelchair Mobility: No  Posture/Postural Control Posture/Postural Control: Postural limitations Postural Limitations: pt kyphotic and with increased tone limiting ability to extend.   Balance Balance Assessed: No Exercise    End of Session PT - End of Session Equipment Utilized During Treatment: Gait belt Activity Tolerance: Patient limited by fatigue Patient left: in chair;with call bell in reach Nurse Communication: Mobility status for transfers;Mobility status for ambulation General Behavior During Session: [facility] for tasks performed Cognition: [facility] for tasks performed  Sunny Schlein,  [phone] 12/15/2011, 12:36 PM

## 2011-12-15 NOTE — Progress Notes (Signed)
Subjective:  Some chest pain yesterday, lasted an hour, she did not tell RN.  Objective:  Vital Signs in the last 24 hours: Temp:  [97.4 F (36.3 C)-98 F (36.7 C)] 98 F (36.7 C) (03/24 0752) Pulse Rate:  [65-98] 65  (03/24 0752) Resp:  [12-24] 12  (03/24 0752) BP: (95-145)/(48-79) 145/79 mmHg (03/24 0752) SpO2:  [92 %-100 %] 96 % (03/24 0752) Weight:  [82.6 kg (182 lb 1.6 oz)] 82.6 kg (182 lb 1.6 oz) (03/24 0500)  Intake/Output from previous day:  Intake/Output Summary (Last 24 hours) at 12/15/11 0829 Last data filed at 12/15/11 0414  Gross per 24 hour  Intake    720 ml  Output   1450 ml  Net   -730 ml    Physical Exam: General appearance: alert, cooperative, no distress and Parkinsonian movements Lungs: basilar crackles Heart: regular rate and rhythm Abd: Soft, NT/ND/NABS  Extrem: chronic skin changes -- still red & warm bilateral LE, but extent of erythema has decreased since yesterday.    Rate: 70  Rhythm: normal sinus rhythm  Lab Results:  Basename 12/15/11 0425 12/14/11 0430  WBC 8.1 7.8  HGB 10.8* 10.6*  PLT 194 173    Basename 12/15/11 0425 12/14/11 0430  NA 135 137  K 4.2 4.3  CL 101 101  CO2 26 29  GLUCOSE 103* 105*  BUN 19 21  CREATININE 0.72 0.78   Imaging: Dg Chest Port 1 View  12/13/2011  *RADIOLOGY REPORT*  Clinical Data: Recent extubation.  Shortness of breath and cough.  PORTABLE CHEST - 1 VIEW  Comparison: Plain films of the chest 12/10/2011 and 09/26/2011.  Findings: There is cardiomegaly but no edema.  Linear atelectasis is seen in the left lung base. No pneumothorax or effusion.  IMPRESSION: Cardiomegaly without acute disease.  Original Report Authenticated By: Bernadene Bell. Maricela Curet, M.D.    Assessment/Plan:   Principal Problem:  *Syncope and collapse  Active Problems:  Parkinson's disease  Chest pain, MI R/O  Abnormal EKG,(TWI lead 3 on one of 7 EKGs this admission)  Diastolic dysfunction, grade 2, echo this admission - Mild  Diastolic CHF on admission, essentially resolved.  HTN (hypertension)  GI bleed: January 2013, Hgb stable, stool negative  Cor pulmonale  CAP (community acquired pneumonia), possible CAP   Plan- Medical Rx. She lives alone and will need HHRN, and home PT at discharge. Will d/c foley and transfer to 2000 in am if no further chest pain. Increase Imdur, low dose statin (LDL 78). Would hold on ACE as B/P is somewhat labile 145 systolic to 98 systolic.  Corine Shelter PA-C 12/15/2011, 8:29 AM  Pt seen & examined along with Mr. Diona Fanti, [place].  I agree with his findings, assessment & plna.  ECG is without significant changes - looks normal today; formal read pending. Repeat ECG in am. Doubt ischemic pain. Echocardiogram with no obvious WMA & preserved EF - diastolic HF is likely etiology for SOB.   Difficult situation, her symptoms could be angina but EKG and Troponin are negative -- most likely MSK Sx vs GERD - has h/o PUD (due to go back to GI MD in April). Echo without WMA.  She could not tolerate Myoview and I doubt she could tolerate even a radial cath secondary to Parkinsons.  For now medical therapy. Will continue beta blocker (new) increase po nitrate, decrease dose of PO Lasix (? CHF on admission mild BNP increase) check BNP in AM tomorrow (not done today).  Would d/c back  on home dose with parameters to take additional dose based upon Wgt increase > 3lb. If recurrent CP - would consider Ranexa 500 mg bid - but Sx do not seem to be overly worrisome to her. -- add PPI for ? GI  Still diuresing with PO diuretics --> reducd dose today Low dose Statin.  She is on Levaquin for possible CAP & ? bilateral LE cellulitis and we will continue this for 5-7 days (Afebrile & WBC still down) -- will f/u CXR today. ? LLE cellulits - less red & warm, Edema much improved.   Needs PT/OT eval - may require OP assistance; wants to go home; consults ordered along with SW consult.   ON PD meds. -- SineMet &  Mirapex  VTE prophylaxis - SQ Lovenox.  Keep in SDU for now as she requires increased RN care.  PT working with her now to assess stability for discharge early this week.  Marykay Lex, M.D., M.S. [facility] [address] [place], [place]  [address]  [phone] Pager # [phone]  12/15/2011 9:11 AM

## 2011-12-16 DIAGNOSIS — R55 Syncope and collapse: Secondary | ICD-10-CM | POA: Diagnosis not present

## 2011-12-16 DIAGNOSIS — I509 Heart failure, unspecified: Secondary | ICD-10-CM | POA: Diagnosis not present

## 2011-12-16 LAB — BASIC METABOLIC PANEL
BUN: 23 mg/dL (ref 6–23)
Calcium: 9.2 mg/dL (ref 8.4–10.5)
Chloride: 99 mEq/L (ref 96–112)
Creatinine, Ser: 0.84 mg/dL (ref 0.50–1.10)
GFR calc Af Amer: 79 mL/min — ABNORMAL LOW (ref >90)

## 2011-12-16 LAB — CBC
HCT: 34.1 % — ABNORMAL LOW (ref 36.0–46.0)
MCHC: 31.4 g/dL (ref 30.0–36.0)
Platelets: 192 10*3/uL (ref 150–400)
RDW: 18.8 % — ABNORMAL HIGH (ref 11.5–15.5)

## 2011-12-16 NOTE — Progress Notes (Signed)
   CARE MANAGEMENT NOTE 12/16/2011  Patient:  [name], [name]   Account Number:  [id number]  Date Initiated:  12/16/2011  Documentation initiated by:  GRAVES-BIGELOW,Marye Eagen  Subjective/Objective Assessment:   Pt admitted with cp. Pt has Hx of Parkinson's disease and right heart failure, moderate pulmonary htn, chronic lower extremity edema, GI bleed in January, dyslipidemia. Patient presented after falling. Pt is from home alone.     Action/Plan:   CM did call pt's brother [name] and he was agreeable with sister and wants her home. Pt is competent to make on decisions and the brother stated that between he and his wife they will provide supervision. Pt uses [employer] for tx/mobile meals.   Anticipated DC Date:  12/17/2011   Anticipated DC Plan:  HOME W HOME HEALTH SERVICES      DC Planning Services  CM consult      [facility] Choice  HOME HEALTH   Choice offered to / List presented to:          [facility] arranged  HH-1 RN  HH-10 DISEASE MANAGEMENT  HH-2 PT  HH-3 OT      [facility] agency  [facility]   Status of service:  Completed, signed off Medicare Important Message given?   (If response is "NO", the following Medicare IM given date fields will be blank) Date Medicare IM given:   Date Additional Medicare IM given:    Discharge Disposition:  HOME W HOME HEALTH SERVICES  Per UR Regulation:    If discussed at Long Length of Stay Meetings, dates discussed:    Comments:  12-16-11 1128 Tomi Bamberger, RN,BSN [phone] CM did make referral with [facility]- also spoke to [place] PA ad he will place order for services.  12-16-11 [address], [place] [phone] CM spoke to pt about dme and she has RW, BSC, Tub Bench and WC. Pt states she gets her own medicaitons together. Pt is refusing [name] at this time and CM feels that she will benefit from [name] at this time- pt has made her choice. Per pt she has a life line button. CM will make referral for [facility] to  visit. SOC to begin with 24-48 hours post d/c. CM will call to get order for services along with face to face signed. Pt will need order for [facility] RN, PT/OT. Thanks

## 2011-12-16 NOTE — Progress Notes (Signed)
Pt. Seen and examined. Agree with the NP/PA-C note as written. Edema has improved as has the cellulitis. Ambulate today, probably home tomorrow.  Chrystie Nose, MD, [facility] Attending Cardiologist [facility]

## 2011-12-16 NOTE — Progress Notes (Signed)
   CARE MANAGEMENT NOTE 12/16/2011  Patient:  [name], [name]   Account Number:  [id number]  Date Initiated:  12/16/2011  Documentation initiated by:  GRAVES-BIGELOW,Edyth Glomb  Subjective/Objective Assessment:   Pt admitted with cp. Pt has Hx of Parkinson's disease and right heart failure, moderate pulmonary htn, chronic lower extremity edema, GI bleed in January, dyslipidemia. Patient presented after falling. Pt is from home alone.     Action/Plan:   CM did call pt's brother Elnita Maxwell and he was agreeable with sister and wants her home. Pt is competent to make on decisions and the brother stated that between he and his wife they will provide supervision. Pt uses [employer] for tx/mobile meals.   Anticipated DC Date:  12/17/2011   Anticipated DC Plan:  HOME W HOME HEALTH SERVICES      DC Planning Services  CM consult      [facility] Choice  HOME HEALTH   Choice offered to / List presented to:          [facility] arranged  HH-1 RN  HH-10 DISEASE MANAGEMENT  HH-2 PT  HH-3 OT      [facility] agency  [facility]   Status of service:  Completed, signed off Medicare Important Message given?   (If response is "NO", the following Medicare IM given date fields will be blank) Date Medicare IM given:   Date Additional Medicare IM given:    Discharge Disposition:  HOME W HOME HEALTH SERVICES  Per UR Regulation:    If discussed at Long Length of Stay Meetings, dates discussed:    Comments:  12-16-11 1101 Tomi Bamberger, RN,BSN [phone] CM spoke to pt about dme and she has RW, BSC, Tub Bench and WC. Pt states she gets her own medicaitons together. Pt is refusing [name] at this time and CM feels that she will benefit from [name] at this time- pt has made her choice. Per pt she has a life line button. CM will make referral for [facility] to visit. SOC to begin with 24-48 hours post d/c. CM will call to get order for services along with face to face signed. Pt will need order for [facility] RN, PT/OT.  Thanks

## 2011-12-16 NOTE — Progress Notes (Signed)
Physical Therapy Treatment Patient Details Name: [name] MRN: [id number] DOB: 02-Feb-1941 Today's Date: 12/16/2011  PT Assessment/Plan  PT - Assessment/Plan Comments on Treatment Session: Pt admitted s/p syncope with fall.  Pt able to increase ambulation distance today.  Increased time to d/w with pt recommendation of [name] due to decreased assistance at home and current decreased balance/independence.  Pt willing to concider, but states she still wants to go home.  Also educated pt on use of RW at home instead of 4-wheeled rollator due to decreased stability of rollator. PT Plan: Discharge plan remains appropriate;Frequency remains appropriate PT Frequency: Min 3X/week Follow Up Recommendations: Skilled nursing facility Equipment Recommended: Defer to next venue PT Goals  Acute Rehab PT Goals PT Goal Formulation: With patient Time For Goal Achievement: 2 weeks PT Goal: Sit to Stand - Progress: Progressing toward goal PT Goal: Stand to Sit - Progress: Progressing toward goal PT Goal: Ambulate - Progress: Progressing toward goal  PT Treatment Precautions/Restrictions  Precautions Precautions: Fall Required Braces or Orthoses: No Restrictions Weight Bearing Restrictions: No Pain No c/o pain with treatment. Mobility (including Balance) Bed Mobility Bed Mobility: No Transfers Transfers: Yes Sit to Stand: 4: Min assist;With upper extremity assist;From chair/3-in-1 (Min (guard)) Sit to Stand Details (indicate cue type and reason): Guarding for balance with pt requiring increased time to complete activity. Stand to Sit: 4: Min assist;With upper extremity assist;To chair/3-in-1 (Min (guard)) Stand to Sit Details: Guarding for balance with cues to ensure slow, safe descent. Ambulation/Gait Ambulation/Gait: Yes Ambulation/Gait Assistance: 4: Min assist Ambulation/Gait Assistance Details (indicate cue type and reason): Assist for balance with cues for tall posture as well as safe  positioning inside RW.  Pt with tendency to keep RW far from self.  Cues to step into RW. Ambulation Distance (Feet): 120 Feet Assistive device: Rolling walker Gait Pattern: Step-through pattern;Decreased stride length;Trunk flexed Stairs: No Wheelchair Mobility Wheelchair Mobility: No  Posture/Postural Control Posture/Postural Control: Postural limitations Postural Limitations: pt kyphotic and with increased tone limiting ability to extend.   Balance Balance Assessed: No End of Session PT - End of Session Equipment Utilized During Treatment: Gait belt Activity Tolerance: Patient tolerated treatment well Patient left: in chair;with call bell in reach Nurse Communication: Mobility status for transfers;Mobility status for ambulation General Behavior During Session: [facility] for tasks performed Cognition: [facility] for tasks performed  Cephus Shelling 12/16/2011, 10:29 AM  12/16/2011 Cephus Shelling, PT, DPT [phone]

## 2011-12-16 NOTE — Evaluation (Signed)
Occupational Therapy Evaluation Patient Details Name: [name] MRN: [id number] DOB: May 20, 1941 Today's Date: 12/16/2011  Problem List:  Patient Active Problem List  Diagnoses  . Hematemesis  . Acute blood loss anemia  . Hypotension  . Leucocytosis  . Aspiration of blood  . Parkinson's disease  . HTN (hypertension)  . GI bleed: January 2013, Hgb stable, stool negative  . Gastric ulcer with hemorrhage  . Esophagitis, erosive  . Chest pain, MI R/O  . Cor pulmonale  . Syncope and collapse  . Abnormal EKG,(TWI lead 3 on one of 7 EKGs this admission)  . Diastolic dysfunction, grade 2  . CAP (community acquired pneumonia), possible CAP    Past Medical History:  Past Medical History  Diagnosis Date  . Parkinson's disease   . Hypertension   . Arthritis   . Anemia    Past Surgical History:  Past Surgical History  Procedure Date  . Hip arthroscopy w/ labral repair   . Thyroidectomy   . Esophagogastroduodenoscopy 09/26/2011    Procedure: ESOPHAGOGASTRODUODENOSCOPY (EGD);  Surgeon: Erick Blinks, MD;  Location: [facility] ENDOSCOPY;  Service: Gastroenterology;  Laterality: N/A;  To be done at bedside.    OT Assessment/Plan/Recommendation OT Assessment Clinical Impression Statement: Pt admitted after falling.  Pt with Parkinson's.  ADL limited by decreased balance and LE weakness. Pt is also reliant on adaptive equipment for LB ADL in the home.  Will follow acutely.  Recommend HHOT. OT Recommendation/Assessment: Patient will need skilled OT in the acute care venue OT Problem List: Decreased strength;Decreased activity tolerance;Impaired balance (sitting and/or standing);Decreased coordination;Impaired tone OT Therapy Diagnosis : Generalized weakness OT Plan OT Frequency: Min 2X/week OT Treatment/Interventions: Self-care/ADL training;Patient/family education OT Recommendation Follow Up Recommendations: Home health OT Equipment Recommended: None recommended by OT Individuals  Consulted Consulted and Agree with Results and Recommendations: Patient OT Goals Acute Rehab OT Goals OT Goal Formulation: With patient Time For Goal Achievement: 2 weeks ADL Goals Pt Will Perform Grooming: with modified independence;Standing at sink ADL Goal: Grooming - Progress: Goal set today Pt Will Perform Lower Body Bathing: with modified independence;Sitting, chair;Sit to stand from chair;with adaptive equipment (at sink) ADL Goal: Lower Body Bathing - Progress: Goal set today Pt Will Perform Lower Body Dressing: with modified independence;Sit to stand from chair;Sitting, chair;with adaptive equipment ADL Goal: Lower Body Dressing - Progress: Goal set today Pt Will Transfer to Toilet: with modified independence;Ambulation;with DME ADL Goal: Toilet Transfer - Progress: Goal set today Pt Will Perform Tub/Shower Transfer: Tub transfer;Transfer tub bench;Ambulation;with modified independence  OT Evaluation Precautions/Restrictions  Precautions Precautions: Fall Restrictions Weight Bearing Restrictions: No Prior Functioning Home Living Lives With: Alone Receives Help From: Family Type of Home: House Home Layout: One level Home Access: Ramped entrance Bathroom Shower/Tub: Engineer, manufacturing systems: Standard Bathroom Accessibility: Yes How Accessible: Accessible via walker Home Adaptive Equipment: Bedside commode/3-in-1;Reacher;Sock aid;Tub transfer bench;Walker - four wheeled Additional Comments: pt lives alone and notes brothergets her groceries for her and she has Meals on wheels for her hot meals.  pt notes she does not have any help cleaning and "it just gets put off for another time".  pt uses [employer] for transportation.   Prior Function Level of Independence: Independent with basic ADLs;Independent with gait;Independent with transfers;Requires assistive device for independence;Needs assistance with homemaking Shopping: Total Able to Take Stairs?: No Driving:  No Vocation: Retired Comments: Had lengthy conversation about safety and progressive nature of Parkinsons and need to make future plans for care. ADL ADL Eating/Feeding:  Simulated;Independent (performance is variable based on tremor) Where Assessed - Eating/Feeding: Chair Grooming: Performed;Wash/dry hands;Wash/dry face;Teeth care;Set up Where Assessed - Grooming: Sitting, chair Upper Body Bathing: Simulated;Minimal assistance Where Assessed - Upper Body Bathing: Sitting, chair Lower Body Bathing: Simulated;+1 Total assistance (pt uses long sponge in shower at home) Where Assessed - Lower Body Bathing: Sitting, chair;Sit to stand from chair Upper Body Dressing: Simulated;Set up Where Assessed - Upper Body Dressing: Sitting, chair Lower Body Dressing: Performed;+1 Total assistance (pt uses AE for LB dressing at home) Where Assessed - Lower Body Dressing: Sitting, chair;Sit to stand from chair Toilet Transfer: Performed;Minimal assistance Toilet Transfer Method: Proofreader: Set designer - Clothing Manipulation: Performed;Supervision/safety Where Assessed - Glass blower/designer Manipulation: Standing Toileting - Hygiene: Performed;Supervision/safety Where Assessed - Toileting Hygiene: Sit on 3-in-1 or toilet Ambulation Related to ADLs: min assist (hands held) in absence of a walker ADL Comments: Pt continues to use AE for LB ADL since her hip fx 5 years ago.  Pt having greater difficulty with housekeeping, but was getting a simple meal, loading dishwasher, and doing laundry PTA.  She has a medical alert device at home.  Pt is in search of someone she could hire for housekeeping. Vision/Perception  Vision - History Baseline Vision: Wears glasses only for reading Patient Visual Report: No change from baseline Cognition Cognition Arousal/Alertness: Awake/alert Overall Cognitive Status: Appears within functional limits for tasks assessed Orientation  Level: Oriented X4 Sensation/Coordination Coordination Gross Motor Movements are Fluid and Coordinated: No Fine Motor Movements are Fluid and Coordinated: No Coordination and Movement Description: Pt became increasingly more tremulous as session continued--meds due this a.m. Extremity Assessment RUE Assessment RUE Assessment: Within Functional Limits (arthritic changes) LUE Assessment LUE Assessment: Within Functional Limits (arthritic changes) Mobility  Bed Mobility Bed Mobility: No Transfers Transfers: Yes Sit to Stand: With upper extremity assist;4: Min assist (min guard and extra time) Stand to Sit: 4: Min assist;With armrests;To chair/3-in-1 Stand to Sit Details: decreased control of descent End of Session OT - End of Session Equipment Utilized During Treatment: Gait belt Activity Tolerance: Patient tolerated treatment well Patient left: in chair;with call bell in reach General Behavior During Session: [facility] for tasks performed Cognition: [facility] for tasks performed   Evern Bio 12/16/2011, 9:56 AM  [phone]

## 2011-12-16 NOTE — Progress Notes (Signed)
[facility]  Subjective: No CP since yesterday.  Lower extremity edema has decreased. "I have my skinny legs back."  Objective: Vital signs in last 24 hours: Temp:  [97.2 F (36.2 C)-98.1 F (36.7 C)] 97.9 F (36.6 C) (03/25 0744) Pulse Rate:  [60-84] 64  (03/25 0744) Resp:  [16-25] 21  (03/25 0744) BP: (84-144)/(43-79) 143/64 mmHg (03/25 0744) SpO2:  [92 %-99 %] 95 % (03/25 0744) Weight:  [81.8 kg (180 lb 5.4 oz)] 81.8 kg (180 lb 5.4 oz) (03/25 0320) Last BM Date: 12/14/11  Intake/Output from previous day: 03/24 0701 - 03/25 0700 In: 600 [P.O.:600] Out: 1700 [Urine:1700] Intake/Output this shift: Total I/O In: 240 [P.O.:240] Out: 300 [Urine:300]  Medications Current Facility-Administered Medications  Medication Dose Route Frequency Provider Last Rate Last Dose  . acetaminophen (TYLENOL) tablet 650 mg  650 mg Oral Q4H PRN Dwana Melena, PA   650 mg at 12/15/11 1708  . albuterol (PROVENTIL) (5 MG/ML) 0.5% nebulizer solution 2.5 mg  2.5 mg Nebulization Q6H PRN Dwana Melena, PA      . albuterol (PROVENTIL) (5 MG/ML) 0.5% nebulizer solution 2.5 mg  2.5 mg Nebulization Q6H Marykay Lex, MD   2.5 mg at 12/16/11 0743  . antiseptic oral rinse (BIOTENE) solution 15 mL  15 mL Mouth Rinse BID Runell Gess, MD   15 mL at 12/16/11 0800  . aspirin EC tablet 81 mg  81 mg Oral Daily Dwana Melena, PA   81 mg at 12/15/11 1018  . atorvastatin (LIPITOR) tablet 10 mg  10 mg Oral q1800 Eda Paschal [place], [place]   10 mg at 12/15/11 1758  . carbidopa-levodopa (SINEMET CR) 50-200 MG per tablet 1 tablet  1 tablet Oral QHS Dwana Melena, PA   1 tablet at 12/15/11 2124  . carbidopa-levodopa (SINEMET) 25-100 MG per tablet 2 tablet  2 tablet Oral TID WC Dwana Melena, PA   2 tablet at 12/16/11 0800  . enoxaparin (LOVENOX) injection 40 mg  40 mg Subcutaneous Q24H Marykay Lex, MD   40 mg at 12/15/11 1758  . ferrous sulfate tablet 325 mg  325 mg Oral Daily Dwana Melena, [place]    325 mg at 12/15/11 1017  . furosemide (LASIX) tablet 20 mg  20 mg Oral Daily Marykay Lex, MD   20 mg at 12/15/11 1017  . isosorbide mononitrate (IMDUR) 24 hr tablet 60 mg  60 mg Oral Daily Eda Paschal [place], [place]   60 mg at 12/15/11 1016  . levofloxacin (LEVAQUIN) tablet 500 mg  500 mg Oral q1800 Marykay Lex, MD   500 mg at 12/15/11 1757  . magnesium oxide (MAG-OX) tablet 400 mg  400 mg Oral BID Dwana Melena, PA   400 mg at 12/15/11 2124  . metoprolol tartrate (LOPRESSOR) tablet 25 mg  25 mg Oral BID Marykay Lex, MD   25 mg at 12/16/11 0030  . nitroGLYCERIN (NITROSTAT) SL tablet 0.4 mg  0.4 mg Sublingual Q5 Min x 3 PRN Dwana Melena, PA   0.4 mg at 12/15/11 1742  . ondansetron (ZOFRAN) injection 4 mg  4 mg Intravenous Q6H PRN Dwana Melena, PA      . pantoprazole (PROTONIX) EC tablet 40 mg  40 mg Oral Daily Marykay Lex, MD   40 mg at 12/15/11 1136  . potassium chloride SA (K-DUR,KLOR-CON) CR tablet 20 mEq  20 mEq Oral QODAY Dwana Melena, PA  20 mEq at 12/14/11 1024  . pramipexole (MIRAPEX) tablet 1.5 mg  1.5 mg Oral QID Dwana Melena, PA   1.5 mg at 12/15/11 2124  . senna-docusate (Senokot-S) tablet 2 tablet  2 tablet Oral QHS Runell Gess, MD   2 tablet at 12/15/11 2124  . sodium chloride (OCEAN) 0.65 % nasal spray 1 spray  1 spray Each Nare PRN Runell Gess, MD      . zolpidem [facility]) tablet 5 mg  5 mg Oral QHS PRN Dwana Melena, PA   5 mg at 12/10/11 2110    PE: General appearance: alert, cooperative and no distress Lungs: Decreased effort and BS bilaterally. Heart: regular rate and rhythm, S1, S2 normal, no murmur, click, rub or gallop Extremities: Trace-1+ LEE. Pulses: 2+ and symmetric Skin:  Mild erythema in the lower extremities.  Calor  Lab Results:   Basename 12/16/11 0532 12/15/11 0425 12/14/11 0430  WBC 7.8 8.1 7.8  HGB 10.7* 10.8* 10.6*  HCT 34.1* 33.8* 33.9*  PLT 192 194 173   BMET  Basename 12/16/11 0532 12/15/11 0425 12/14/11 0430  NA 134* 135  137  K 4.0 4.2 4.3  CL 99 101 101  CO2 27 26 29   GLUCOSE 89 103* 105*  BUN 23 19 21   CREATININE 0.84 0.72 0.78  CALCIUM 9.2 9.1 9.0    Assessment/Plan   Principal Problem:  *Syncope and collapse Active Problems:  Parkinson's disease  HTN (hypertension)  GI bleed: January 2013, Hgb stable, stool negative  Chest pain, MI R/O  Cor pulmonale  Abnormal EKG,(TWI lead 3 on one of 7 EKGs this admission)  Diastolic dysfunction, grade 2  CAP (community acquired pneumonia), possible CAP  Plan: No CP since yesterday.  It was worse with inspiration.  Likely musculoskeletal. Net diuresis in the last two days 1.83L.  Improved LEE. LE erythema improve with levequin PO.  Continue for today and tomorrow.  Lasix has been decreased already to 20mg  PO daily.  Transfer to tele.  Ambulate with assistance/walker.  HH RN/PT.  Likely DC tomorrow.   LOS: 6 days    [name] W 12/16/2011 9:31 AM

## 2011-12-17 DIAGNOSIS — R55 Syncope and collapse: Secondary | ICD-10-CM | POA: Diagnosis not present

## 2011-12-17 DIAGNOSIS — I509 Heart failure, unspecified: Secondary | ICD-10-CM | POA: Diagnosis not present

## 2011-12-17 LAB — BASIC METABOLIC PANEL
BUN: 24 mg/dL — ABNORMAL HIGH (ref 6–23)
Creatinine, Ser: 0.83 mg/dL (ref 0.50–1.10)
GFR calc Af Amer: 80 mL/min — ABNORMAL LOW (ref >90)
GFR calc non Af Amer: 69 mL/min — ABNORMAL LOW (ref >90)
Potassium: 4.8 mEq/L (ref 3.5–5.1)

## 2011-12-17 LAB — CBC
Hemoglobin: 10.7 g/dL — ABNORMAL LOW (ref 12.0–15.0)
MCHC: 31.5 g/dL (ref 30.0–36.0)
RDW: 19.1 % — ABNORMAL HIGH (ref 11.5–15.5)
WBC: 8.1 10*3/uL (ref 4.0–10.5)

## 2011-12-17 MED ORDER — FUROSEMIDE 40 MG PO TABS: 20.0000 mg | ORAL_TABLET | Freq: Two times a day (BID) | ORAL | Status: DC

## 2011-12-17 MED ORDER — FUROSEMIDE 20 MG PO TABS: 20.0000 mg | ORAL_TABLET | Freq: Two times a day (BID) | ORAL | Status: DC

## 2011-12-17 MED ORDER — ASPIRIN 81 MG PO TBEC: 81.0000 mg | DELAYED_RELEASE_TABLET | Freq: Every day | ORAL | Status: AC

## 2011-12-17 MED ORDER — NITROGLYCERIN 0.4 MG SL SUBL: 0.4000 mg | SUBLINGUAL_TABLET | SUBLINGUAL | Status: AC | PRN

## 2011-12-17 MED ORDER — ATORVASTATIN CALCIUM 10 MG PO TABS: 10.0000 mg | ORAL_TABLET | Freq: Every day | ORAL | Status: DC

## 2011-12-17 MED ORDER — METOPROLOL TARTRATE 25 MG PO TABS: 25.0000 mg | ORAL_TABLET | Freq: Two times a day (BID) | ORAL | Status: DC

## 2011-12-17 MED ORDER — LEVOFLOXACIN 500 MG PO TABS: 500.0000 mg | ORAL_TABLET | Freq: Every day | ORAL | Status: DC

## 2011-12-17 MED ORDER — LEVOFLOXACIN 500 MG PO TABS: 500.0000 mg | ORAL_TABLET | Freq: Every day | ORAL | Status: AC

## 2011-12-17 MED ORDER — PANTOPRAZOLE SODIUM 40 MG PO TBEC: 40.0000 mg | DELAYED_RELEASE_TABLET | Freq: Every day | ORAL | Status: DC

## 2011-12-17 MED ORDER — ISOSORBIDE MONONITRATE ER 60 MG PO TB24: 60.0000 mg | ORAL_TABLET | Freq: Every day | ORAL | Status: DC

## 2011-12-17 NOTE — Progress Notes (Signed)
[facility]  Subjective: Slept well.  She had a sharp chest pain which went away quickly.  Objective: Vital signs in last 24 hours: Temp:  [97 F (36.1 C)-98.8 F (37.1 C)] 98.2 F (36.8 C) (03/26 0549) Pulse Rate:  [72-90] 72  (03/26 0549) Resp:  [18-20] 19  (03/26 0549) BP: (119-159)/(62-128) 152/76 mmHg (03/26 0549) SpO2:  [90 %-96 %] 95 % (03/26 0549) Weight:  [80.967 kg (178 lb 8 oz)] 80.967 kg (178 lb 8 oz) (03/26 0549) Last BM Date: 12/16/11  Intake/Output from previous day: 03/25 0701 - 03/26 0700 In: 960 [P.O.:960] Out: 2201 [Urine:2200; Stool:1] Intake/Output this shift: Total I/O In: -  Out: 200 [Urine:200]  Medications Current Facility-Administered Medications  Medication Dose Route Frequency Provider Last Rate Last Dose  . acetaminophen (TYLENOL) tablet 650 mg  650 mg Oral Q4H PRN Dwana Melena, PA   650 mg at 12/17/11 1610  . albuterol (PROVENTIL) (5 MG/ML) 0.5% nebulizer solution 2.5 mg  2.5 mg Nebulization Q6H PRN Dwana Melena, PA      . albuterol (PROVENTIL) (5 MG/ML) 0.5% nebulizer solution 2.5 mg  2.5 mg Nebulization Q6H Marykay Lex, MD   2.5 mg at 12/17/11 0826  . antiseptic oral rinse (BIOTENE) solution 15 mL  15 mL Mouth Rinse BID Runell Gess, MD   15 mL at 12/16/11 2000  . aspirin EC tablet 81 mg  81 mg Oral Daily Dwana Melena, PA   81 mg at 12/16/11 1000  . atorvastatin (LIPITOR) tablet 10 mg  10 mg Oral q1800 Eda Paschal [place], [place]   10 mg at 12/16/11 1820  . carbidopa-levodopa (SINEMET CR) 50-200 MG per tablet 1 tablet  1 tablet Oral QHS Dwana Melena, PA   1 tablet at 12/16/11 2231  . carbidopa-levodopa (SINEMET) 25-100 MG per tablet 2 tablet  2 tablet Oral TID WC Dwana Melena, PA   2 tablet at 12/17/11 9604  . enoxaparin (LOVENOX) injection 40 mg  40 mg Subcutaneous Q24H Marykay Lex, MD   40 mg at 12/16/11 1820  . ferrous sulfate tablet 325 mg  325 mg Oral Daily Dwana Melena, PA   325 mg at 12/16/11 1000    . furosemide (LASIX) tablet 20 mg  20 mg Oral Daily Marykay Lex, MD   20 mg at 12/16/11 1000  . isosorbide mononitrate (IMDUR) 24 hr tablet 60 mg  60 mg Oral Daily Eda Paschal [place], [place]   60 mg at 12/16/11 1000  . levofloxacin (LEVAQUIN) tablet 500 mg  500 mg Oral q1800 Marykay Lex, MD   500 mg at 12/16/11 1820  . magnesium oxide (MAG-OX) tablet 400 mg  400 mg Oral BID Dwana Melena, PA   400 mg at 12/16/11 2231  . metoprolol tartrate (LOPRESSOR) tablet 25 mg  25 mg Oral BID Marykay Lex, MD   25 mg at 12/16/11 2231  . nitroGLYCERIN (NITROSTAT) SL tablet 0.4 mg  0.4 mg Sublingual Q5 Min x 3 PRN Dwana Melena, PA   0.4 mg at 12/15/11 1742  . ondansetron (ZOFRAN) injection 4 mg  4 mg Intravenous Q6H PRN Dwana Melena, PA      . pantoprazole (PROTONIX) EC tablet 40 mg  40 mg Oral Daily Marykay Lex, MD   40 mg at 12/16/11 1000  . potassium chloride SA (K-DUR,KLOR-CON) CR tablet 20 mEq  20 mEq Oral QODAY Dwana Melena, PA  20 mEq at 12/16/11 1000  . pramipexole (MIRAPEX) tablet 1.5 mg  1.5 mg Oral QID Dwana Melena, PA   1.5 mg at 12/16/11 1821  . senna-docusate (Senokot-S) tablet 2 tablet  2 tablet Oral QHS Runell Gess, MD   2 tablet at 12/16/11 2231  . sodium chloride (OCEAN) 0.65 % nasal spray 1 spray  1 spray Each Nare PRN Runell Gess, MD      . zolpidem Remus Loffler) tablet 5 mg  5 mg Oral QHS PRN Dwana Melena, PA   5 mg at 12/10/11 2110    PE: General appearance: alert, cooperative and no distress Lungs: clear to auscultation bilaterally Heart: regular rate and rhythm, S1, S2 normal, no murmur, click, rub or gallop Extremities: Trace Lower Ext Edema Pulses: 2+ and symmetric Skin:  Improving erythema in bilateral lower extremities.    Lab Results:   Basename 12/17/11 0502 12/16/11 0532 12/15/11 0425  WBC 8.1 7.8 8.1  HGB 10.7* 10.7* 10.8*  HCT 34.0* 34.1* 33.8*  PLT 172 192 194   BMET  Basename 12/17/11 0502 12/16/11 0532 12/15/11 0425  NA 140 134* 135  K 4.8  4.0 4.2  CL 104 99 101  CO2 28 27 26   GLUCOSE 86 89 103*  BUN 24* 23 19  CREATININE 0.83 0.84 0.72  CALCIUM 8.9 9.2 9.1   Assessment/Plan  Principal Problem:  *Syncope and collapse Active Problems:  Parkinson's disease  HTN (hypertension)  GI bleed: January 2013, Hgb stable, stool negative  Chest pain, MI R/O  Cor pulmonale  Abnormal EKG,(TWI lead 3 on one of 7 EKGs this admission)  Diastolic dysfunction, grade 2  CAP (community acquired pneumonia), possible CAP  Plan:  Still with intermittent CP which is likely musculoskeletal.  Resolves quickly.  She continues to diurese on 20mg  of Lasix daily.  Net fluids: nega 1241 yesterday.  Discussed daily weights.  Home health RN,PT/OT services arranged by CM.  Last day of Levaquin is today.  Lower ext celluitis is improving.  DC home today.  With follow up in a week.   LOS: 7 days    Niylah Hassan W 12/17/2011 9:25 AM

## 2011-12-17 NOTE — Progress Notes (Signed)
Subjective: No chest pain.  Objective: Vital signs in last 24 hours: Temp:  [97 F (36.1 C)-98.8 F (37.1 C)] 98.2 F (36.8 C) (03/26 0549) Pulse Rate:  [72-90] 72  (03/26 0549) Resp:  [18-20] 19  (03/26 0549) BP: (119-159)/(62-128) 152/76 mmHg (03/26 0549) SpO2:  [90 %-96 %] 95 % (03/26 0549) Weight:  [80.967 kg (178 lb 8 oz)] 80.967 kg (178 lb 8 oz) (03/26 0549) Weight change: -0.833 kg (-1 lb 13.4 oz) Last BM Date: 12/16/11 Intake/Output from previous day: -1241 03/25 0701 - 03/26 0700 In: 960 [P.O.:960] Out: 2201 [Urine:2200; Stool:1] Intake/Output this shift: Total I/O In: 240 [P.O.:240] Out: 200 [Urine:200]  PE: General: Neck: Heart: Lungs: Abd: Ext: Neuro:   Lab Results:  Basename 12/17/11 0502 12/16/11 0532  WBC 8.1 7.8  HGB 10.7* 10.7*  HCT 34.0* 34.1*  PLT 172 192   BMET  Basename 12/17/11 0502 12/16/11 0532  NA 140 134*  K 4.8 4.0  CL 104 99  CO2 28 27  GLUCOSE 86 89  BUN 24* 23  CREATININE 0.83 0.84  CALCIUM 8.9 9.2   No results found for this basename: TROPONINI:2,CK,MB:2 in the last 72 hours  Lab Results  Component Value Date   CHOL 159 12/11/2011   HDL 72 12/11/2011   LDLCALC 78 12/11/2011   TRIG 44 12/11/2011   CHOLHDL 2.2 12/11/2011       EKG: Orders placed during the hospital encounter of 12/10/11  . EKG 12-LEAD  . EKG 12-LEAD  . EKG 12-LEAD  . EKG  . EKG 12-LEAD  . EKG 12-LEAD    Studies/Results:  2D Echo: Study Conclusions  - Procedure narrative: Transthoracic echocardiography. Image quality was adequate but the data was insufficient for a complete study. - Left ventricle: The cavity size was normal. Wall thickness was increased in a pattern of mild LVH. There was mild concentric hypertrophy. Systolic function was normal. The estimated ejection fraction was in the range of 55% to 60%. Wall motion was normal; there were no regional wall motion abnormalities. Features are consistent with a pseudonormal left  ventricular filling pattern, with concomitant abnormal relaxation and increased filling pressure (grade 2 diastolic dysfunction). Doppler parameters are consistent with both elevated ventricular end-diastolic filling pressure and elevated left atrial filling pressure. - Aortic valve: Mildly to moderately calcified annulus. Trileaflet; normal thickness, mildly calcified leaflets. Thickening. - Left atrium: The atrium was massively dilated. - Right ventricle: The cavity size was mildly dilated. Wall thickness was normal. - Pulmonary arteries: The main pulmonary artery appears to be mildly dilated. PA peak pressure: 37mm Hg (S).     Medications: I have reviewed the patient's current medications.    Marland Kitchen albuterol  2.5 mg Nebulization Q6H  . antiseptic oral rinse  15 mL Mouth Rinse BID  . aspirin EC  81 mg Oral Daily  . atorvastatin  10 mg Oral q1800  . carbidopa-levodopa  1 tablet Oral QHS  . carbidopa-levodopa  2 tablet Oral TID WC  . enoxaparin (LOVENOX) injection  40 mg Subcutaneous Q24H  . ferrous sulfate  325 mg Oral Daily  . furosemide  20 mg Oral Daily  . isosorbide mononitrate  60 mg Oral Daily  . levofloxacin  500 mg Oral q1800  . magnesium oxide  400 mg Oral BID  . metoprolol tartrate  25 mg Oral BID  . pantoprazole  40 mg Oral Daily  . potassium chloride SA  20 mEq Oral QODAY  . pramipexole  1.5 mg Oral QID  .  senna-docusate  2 tablet Oral QHS   Assessment/Plan: Patient Active Problem List  Diagnoses  . Hematemesis  . Acute blood loss anemia  . Hypotension  . Leucocytosis  . Aspiration of blood  . Parkinson's disease  . HTN (hypertension)  . GI bleed: January 2013, Hgb stable, stool negative  . Gastric ulcer with hemorrhage  . Esophagitis, erosive  . Chest pain, MI R/O  . Cor pulmonale  . Syncope and collapse  . Abnormal EKG,(TWI lead 3 on one of 7 EKGs this admission)  . Diastolic dysfunction, grade 2  . CAP (community acquired pneumonia), possible CAP     PLAN: neg. MI, on Levaquin for CAP. Pt. Transferred to tele yest.  ? Ambulation. Cellul;itis improving.  No further syncope.  PT has recommended [name]. History: The patient is a 71 year old Caucasian female with a history of Parkinson's disease and right heart failure, moderate pulmonary hypertension, chronic lower extremity edema, GI bleed in January, dyslipidemia. Patient presented after falling today approximately 9:00  12/10/11. Patient states she started "feeling funny in the head" became weak and fell to the floor. She fell in her left hip and knee. Reports cough and congestion all time and lower extremity edema all the time as well as "a little" dysuria. While in the emergency room the patient developed a sharp chest pain midsternal 5/10 in intensity at its worst 3/10. This resulted in a T-wave inversion in lead 3. She notes the chest pain is worse with palpation and also with inspiration. She has also had recurrent chest pain.   PLEASE SEEB. HAGERS NOTE FOR EXAM AND PLAN.   LOS: 7 days   Dellanira Dillow R 12/17/2011, 9:36 AM

## 2011-12-17 NOTE — Progress Notes (Signed)
Pt. Seen and examined. Agree with the NP/PA-C note as written. Ok for discharge today.  Chrystie Nose, MD, [facility] Attending Cardiologist [facility]

## 2011-12-17 NOTE — Progress Notes (Signed)
Occupational Therapy Treatment Patient Details Name: [name] MRN: [id number] DOB: January 21, 1941 Today's Date: 12/17/2011  OT Assessment/Plan OT Assessment/Plan Comments on Treatment Session: This 71 yo female making progress, still feel she would be better off if she went to [name] first then home; however pt prefers to go home with home health services. OT Plan: Discharge plan remains appropriate OT Frequency: Min 2X/week Follow Up Recommendations: Home health OT Equipment Recommended: None recommended by OT (Pt has 2 3-n-1s ) OT Goals ADL Goals ADL Goal: Grooming - Progress: Progressing toward goals ADL Goal: Toilet Transfer - Progress: Progressing toward goals  OT Treatment Precautions/Restrictions  Precautions Precautions: Fall Required Braces or Orthoses: No Restrictions Weight Bearing Restrictions: No   ADL ADL Grooming: Performed;Teeth care;Brushing hair;Supervision/safety;Set up Grooming Details (indicate cue type and reason): standing to brush teeth, sitting to brush and fix hair Toilet Transfer: Performed;Supervision/safety Toilet Transfer Method: Freight forwarder Equipment: Raised toilet seat with arms (or 3-in-1 over toilet) Toileting - Clothing Manipulation: Performed;Independent (gown) Where Assessed - Toileting Clothing Manipulation: Standing Toileting - Hygiene: Performed;Independent Where Assessed - Toileting Hygiene: Sit on 3-in-1 or toilet Ambulation Related to ADLs: Supervision with RW Mobility  Bed Mobility Bed Mobility: No Transfers Transfers: Yes Sit to Stand: 5: Supervision;With upper extremity assist;With armrests;From chair/3-in-1 Sit to Stand Details (indicate cue type and reason): Pt had to rock back and forth, finally go up on 5th trial Stand to Sit: 5: Supervision;With upper extremity assist;With armrests;To chair/3-in-1 Stand to Sit Details: safely turned and reached back for arms of chair and 3-n-1 Exercises    End of  Session OT - End of Session Equipment Utilized During Treatment:  (RW) Activity Tolerance: Patient limited by fatigue Patient left: in chair;with call bell in reach General Behavior During Session: [facility] for tasks performed Cognition: [facility] for tasks performed  Evette Georges [phone] 12/17/2011, 2:33 PM

## 2011-12-17 NOTE — Discharge Instructions (Signed)
Home Health will assist with PT/OT and RN   Only take 20 mg of Furosemide daily, either 20 mg tabs or half of 40 mg tab.  Heart healthy diet.

## 2011-12-19 DIAGNOSIS — I509 Heart failure, unspecified: Secondary | ICD-10-CM | POA: Diagnosis not present

## 2011-12-19 DIAGNOSIS — L02419 Cutaneous abscess of limb, unspecified: Secondary | ICD-10-CM | POA: Diagnosis not present

## 2011-12-19 DIAGNOSIS — R293 Abnormal posture: Secondary | ICD-10-CM | POA: Diagnosis not present

## 2011-12-19 DIAGNOSIS — G20 Parkinson's disease: Secondary | ICD-10-CM | POA: Diagnosis not present

## 2011-12-19 DIAGNOSIS — I4891 Unspecified atrial fibrillation: Secondary | ICD-10-CM | POA: Diagnosis not present

## 2011-12-19 DIAGNOSIS — A048 Other specified bacterial intestinal infections: Secondary | ICD-10-CM | POA: Diagnosis not present

## 2011-12-19 DIAGNOSIS — I5023 Acute on chronic systolic (congestive) heart failure: Secondary | ICD-10-CM | POA: Diagnosis not present

## 2011-12-19 DIAGNOSIS — R262 Difficulty in walking, not elsewhere classified: Secondary | ICD-10-CM | POA: Diagnosis not present

## 2011-12-19 DIAGNOSIS — K5900 Constipation, unspecified: Secondary | ICD-10-CM | POA: Diagnosis not present

## 2011-12-19 DIAGNOSIS — M6281 Muscle weakness (generalized): Secondary | ICD-10-CM | POA: Diagnosis not present

## 2011-12-19 DIAGNOSIS — I10 Essential (primary) hypertension: Secondary | ICD-10-CM | POA: Diagnosis not present

## 2011-12-19 DIAGNOSIS — K219 Gastro-esophageal reflux disease without esophagitis: Secondary | ICD-10-CM | POA: Diagnosis not present

## 2011-12-20 ENCOUNTER — Ambulatory Visit
Admission: RE | Admit: 2011-12-20 | Discharge: 2011-12-20 | Disposition: A | Discharge status: Home / Self Care | Payer: Medicare Other | Source: Ambulatory Visit | Attending: Internal Medicine | Admitting: Internal Medicine

## 2011-12-20 ENCOUNTER — Other Ambulatory Visit: Payer: Self-pay | Admitting: Internal Medicine

## 2011-12-20 DIAGNOSIS — I509 Heart failure, unspecified: Secondary | ICD-10-CM

## 2011-12-20 DIAGNOSIS — J984 Other disorders of lung: Secondary | ICD-10-CM | POA: Diagnosis not present

## 2011-12-20 DIAGNOSIS — R0602 Shortness of breath: Secondary | ICD-10-CM | POA: Diagnosis not present

## 2011-12-24 ENCOUNTER — Encounter (HOSPITAL_COMMUNITY): Payer: Self-pay | Admitting: Cardiology

## 2011-12-24 DIAGNOSIS — I4891 Unspecified atrial fibrillation: Secondary | ICD-10-CM | POA: Diagnosis not present

## 2011-12-24 DIAGNOSIS — L03119 Cellulitis of unspecified part of limb: Secondary | ICD-10-CM

## 2011-12-24 DIAGNOSIS — I10 Essential (primary) hypertension: Secondary | ICD-10-CM | POA: Diagnosis not present

## 2011-12-24 DIAGNOSIS — K219 Gastro-esophageal reflux disease without esophagitis: Secondary | ICD-10-CM | POA: Diagnosis not present

## 2011-12-24 DIAGNOSIS — M6281 Muscle weakness (generalized): Secondary | ICD-10-CM | POA: Diagnosis not present

## 2011-12-24 DIAGNOSIS — G20 Parkinson's disease: Secondary | ICD-10-CM | POA: Diagnosis not present

## 2011-12-24 DIAGNOSIS — I509 Heart failure, unspecified: Secondary | ICD-10-CM | POA: Diagnosis not present

## 2011-12-24 DIAGNOSIS — R338 Other retention of urine: Secondary | ICD-10-CM | POA: Diagnosis not present

## 2011-12-24 HISTORY — DX: Cellulitis of unspecified part of limb: L03.119

## 2011-12-24 NOTE — Discharge Summary (Signed)
Physician Discharge Summary  Patient ID: IVAN MASKELL MRN: [id number] DOB/AGE: 72-Jan-1942 72 y.o.  Admit date: 12/10/2011 Discharge date: 12/17/2011  Discharge Diagnoses:  Principal Problem:  *Syncope and collapse, pt fell and did not know why she fell. Active Problems:  Chest pain, MI R/O, negative MI- secondary to CAP 12/10/11  Abnormal EKG,(TWI lead 3 on one of 7 EKGs this admission)  CAP (community acquired pneumonia)   Parkinson's disease  Cor pulmonale  Diastolic dysfunction, grade 2  Acute urinary retention, resolved after foley and no re-occurrence, 12/11/11   Cellulitis of lower leg  HTN (hypertension)  GI bleed: January 2013, Hgb stable, stool negative   Discharged Condition: good  Hospital Course: The patient is a 71 year old Caucasian female with a history of Parkinson's disease and right heart failure, moderate pulmonary hypertension, chronic lower extremity edema, GI bleed in January, dyslipidemia. Patient presented after falling 12/10/2011 approximately 9:00 this morning. Patient states she started "feeling funny in the head" became weak and fell to the floor. She fell on her left hip and knee. Reports cough and congestion all the time and lower extremity edema all the time as well as "a little" dysuria. While in the emergency room the patient developed a sharp chest pain midsternal 5/10 in intensity at its worst currently 3/10. This resulted in a T-wave inversion in lead 3. She notes the chest pain is worse with palpation and also with inspiration. Nitro paste was placed R. Chest. She stated that the NTG helped a little bit. She was admitted to the step down unit started on IV diuretics and Levaquin for cellulitis. Cardiac enzymes were to be cycled.  By the next morning she had had Foley catheter placed secondary to urinary retention she had 600 cc in her bladder at the time the Foley was placed.  She had some shortness of breath continued. She did diurese and her blood pressure  was slightly low. Lower extremities with erythema continued. Later on 12/11/2011 she complained of chest pain again and nitroglycerin was given. Pain resolved.  Her cardiac enzymes are negative and it was felt she could not tolerate my view due to her respiratory issues at the current time. Medication adjustments were made.  Her chest pain increased with palpation and with cough.  Toradol was added to her medical regimen for her pain. EKG improved. Over the next several days she continued to improve. Her lower extremity erythema had improved as well while she's on her antibiotics.  She continued to improve her lower extremity edema resolved and her strength returned. She would continue to have an occasional sharp chest pain that would resolve quickly.  She was seen and evaluated on March 26 Dr. Rennis Golden who felt she was ready for discharge home.  She can ambulate without complications.  Her chest pain was not felt to be cardiac she did not have a myocardial infarction. It was felt the chest wall pain was musculoskeletal secondary to community-acquired pneumonia. She'll complete her antibiotics and followup as an outpatient.  Admission weight 86.8 kg (191.5 lbs) Discharge weight 80.96 kg (178.8 lbs)   Consults: None  Significant Diagnostic Studies:  At discharge sodium 140 potassium 4.8 chloride 104 CO2 28 BUN 24 creatinine 0.83 calcium 8.9  Hemoglobin 10.7 hematocrit 34 WBC 8.1 platelets 172. Cardiac enzymes are negative CK ranged 670-334, MB 2.5-2.7, troponin I < 0.30.  Total cholesterol 159 triglycerides 44 HDL 72 LDL 78  D-dimer 0.53 Urinalysis was clear.  Stool for occult blood was negative.  Two-view chest x-ray on admission: IMPRESSION:  Stable cardiomegaly with pulmonary vascular congestion. Low lung  volumes and left basilar atelectasis.   Hip x-ray on admission left hip was negative for any fracture.  2 D ECHO done 12/13/2011:    - Procedure narrative: Transthoracic  echocardiography. Image quality was adequate but the data was insufficient for a complete study. - Left ventricle: The cavity size was normal. Wall thickness was increased in a pattern of mild LVH. There was mild concentric hypertrophy. Systolic function was normal. The estimated ejection fraction was in the range of 55% to 60%. Wall motion was normal; there were no regional wall motion abnormalities. Features are consistent with a pseudonormal left ventricular filling pattern, with concomitant abnormal relaxation and increased filling pressure (grade 2 diastolic dysfunction). Doppler parameters are consistent with both elevated ventricular end-diastolic filling pressure and elevated left atrial filling pressure. - Aortic valve: Mildly to moderately calcified annulus. Trileaflet; normal thickness, mildly calcified leaflets. Thickening. - Left atrium: The atrium was massively dilated. - Right ventricle: The cavity size was mildly dilated. Wall thickness was normal. - Pulmonary arteries: The main pulmonary artery appears to be mildly dilated. PA peak pressure: 37mm Hg (S). Impressions:  - Frequent ectopy was noted on this study, making this study technically difficult for the assessment of cardiac function.      Discharge Exam: Blood pressure 148/74, pulse 76, temperature 98.4 F (36.9 C), temperature source Oral, resp. rate 18, height 5\' 4"  (1.626 m), weight 80.967 kg (178 lb 8 oz), SpO2 95.00%.  General appearance: alert, cooperative and no distress  Lungs: clear to auscultation bilaterally  Heart: regular rate and rhythm, S1, S2 normal, no murmur, click, rub or gallop  Extremities: Trace Lower Ext Edema  Pulses: 2+ and symmetric  Skin: Improving erythema in bilateral lower extremities.      Disposition: 06-Home-Health Care Svc  Discharge Orders    Future Appointments: Provider: Department: Dept Phone: Center:   01/08/2012 10:30 AM Beverley Fiedler, MD [facility] [phone] [facility]     Medication List  As of 12/24/2011 11:51 AM   TAKE these medications         albuterol (2.5 MG/3ML) 0.083% nebulizer solution   Commonly known as: PROVENTIL   Take 2.5 mg by nebulization every 6 (six) hours as needed. For shortness of breath      aspirin 81 MG EC tablet   Take 1 tablet (81 mg total) by mouth daily.      atorvastatin 10 MG tablet   Commonly known as: LIPITOR   Take 1 tablet (10 mg total) by mouth daily at 6 PM.      b complex vitamins tablet   Take 1 tablet by mouth daily.      carbidopa-levodopa 50-200 MG per tablet   Commonly known as: SINEMET CR   Take 1 tablet by mouth at bedtime.      carbidopa-levodopa 25-100 MG per tablet   Commonly known as: SINEMET   Take 2 tablets by mouth 3 (three) times daily.      ferrous sulfate 325 (65 FE) MG tablet   Take 325 mg by mouth daily.      furosemide 20 MG tablet   Commonly known as: LASIX   Take 1 tablet (20 mg total) by mouth 2 (two) times daily.      isosorbide mononitrate 60 MG 24 hr tablet   Commonly known as: IMDUR   Take 1 tablet (60 mg total) by mouth daily.  magnesium oxide 400 MG tablet   Commonly known as: MAG-OX   Take 400 mg by mouth 2 (two) times daily.      metoprolol tartrate 25 MG tablet   Commonly known as: LOPRESSOR   Take 1 tablet (25 mg total) by mouth 2 (two) times daily.      nitroGLYCERIN 0.4 MG SL tablet   Commonly known as: NITROSTAT   Place 1 tablet (0.4 mg total) under the tongue every 5 (five) minutes x 3 doses as needed for chest pain.      pantoprazole 40 MG tablet   Commonly known as: PROTONIX   Take 1 tablet (40 mg total) by mouth daily.      potassium chloride SA 20 MEQ tablet   Commonly known as: K-DUR,KLOR-CON   Take 20 mEq by mouth every other day.      pramipexole 1.5 MG tablet   Commonly known as: MIRAPEX   Take 1.5 mg by mouth 4 (four) times daily.      SALINE NASAL SPRAY NA   Place 1 spray into the nose 3 (three) times daily  as needed. For congestion      sennosides-docusate sodium 8.6-50 MG tablet   Commonly known as: SENOKOT-S   Take 2 tablets by mouth daily as needed. For constipation           Follow-up Information    Follow up with REED,[name] L., DO. (call for appointment.)       Follow up with Thurmon Fair, MD on 01/03/2012. (at 8:45)    Contact information:   [address] [place] [address] [phone]        Discharge instructions: Home Health will assist with PT/OT and RN   Only take 20 mg of Furosemide daily, either 20 mg tabs or half of 40 mg tab.  Heart healthy diet.   SignedLeone Brand 12/24/2011, 11:51 AM

## 2011-12-25 DIAGNOSIS — I4891 Unspecified atrial fibrillation: Secondary | ICD-10-CM | POA: Diagnosis not present

## 2011-12-25 DIAGNOSIS — M6281 Muscle weakness (generalized): Secondary | ICD-10-CM | POA: Diagnosis not present

## 2011-12-25 DIAGNOSIS — I509 Heart failure, unspecified: Secondary | ICD-10-CM | POA: Diagnosis not present

## 2011-12-25 DIAGNOSIS — G20 Parkinson's disease: Secondary | ICD-10-CM | POA: Diagnosis not present

## 2011-12-25 DIAGNOSIS — K219 Gastro-esophageal reflux disease without esophagitis: Secondary | ICD-10-CM | POA: Diagnosis not present

## 2011-12-25 DIAGNOSIS — I10 Essential (primary) hypertension: Secondary | ICD-10-CM | POA: Diagnosis not present

## 2011-12-26 NOTE — Discharge Summary (Signed)
[name] Guerreiro C. Calum Cormier, MD, FACC Attending Cardiologist [facility]  

## 2011-12-30 DIAGNOSIS — M6281 Muscle weakness (generalized): Secondary | ICD-10-CM | POA: Diagnosis not present

## 2011-12-30 DIAGNOSIS — G20 Parkinson's disease: Secondary | ICD-10-CM | POA: Diagnosis not present

## 2011-12-30 DIAGNOSIS — I4891 Unspecified atrial fibrillation: Secondary | ICD-10-CM | POA: Diagnosis not present

## 2011-12-30 DIAGNOSIS — I509 Heart failure, unspecified: Secondary | ICD-10-CM | POA: Diagnosis not present

## 2011-12-30 DIAGNOSIS — I10 Essential (primary) hypertension: Secondary | ICD-10-CM | POA: Diagnosis not present

## 2011-12-30 DIAGNOSIS — K219 Gastro-esophageal reflux disease without esophagitis: Secondary | ICD-10-CM | POA: Diagnosis not present

## 2012-01-01 DIAGNOSIS — M6281 Muscle weakness (generalized): Secondary | ICD-10-CM | POA: Diagnosis not present

## 2012-01-01 DIAGNOSIS — K219 Gastro-esophageal reflux disease without esophagitis: Secondary | ICD-10-CM | POA: Diagnosis not present

## 2012-01-01 DIAGNOSIS — G20 Parkinson's disease: Secondary | ICD-10-CM | POA: Diagnosis not present

## 2012-01-01 DIAGNOSIS — I10 Essential (primary) hypertension: Secondary | ICD-10-CM | POA: Diagnosis not present

## 2012-01-01 DIAGNOSIS — I4891 Unspecified atrial fibrillation: Secondary | ICD-10-CM | POA: Diagnosis not present

## 2012-01-01 DIAGNOSIS — I509 Heart failure, unspecified: Secondary | ICD-10-CM | POA: Diagnosis not present

## 2012-01-03 DIAGNOSIS — I509 Heart failure, unspecified: Secondary | ICD-10-CM | POA: Diagnosis not present

## 2012-01-04 DIAGNOSIS — G20 Parkinson's disease: Secondary | ICD-10-CM | POA: Diagnosis not present

## 2012-01-04 DIAGNOSIS — I4891 Unspecified atrial fibrillation: Secondary | ICD-10-CM | POA: Diagnosis not present

## 2012-01-04 DIAGNOSIS — M6281 Muscle weakness (generalized): Secondary | ICD-10-CM | POA: Diagnosis not present

## 2012-01-04 DIAGNOSIS — I509 Heart failure, unspecified: Secondary | ICD-10-CM | POA: Diagnosis not present

## 2012-01-04 DIAGNOSIS — I10 Essential (primary) hypertension: Secondary | ICD-10-CM | POA: Diagnosis not present

## 2012-01-04 DIAGNOSIS — K219 Gastro-esophageal reflux disease without esophagitis: Secondary | ICD-10-CM | POA: Diagnosis not present

## 2012-01-06 DIAGNOSIS — I4891 Unspecified atrial fibrillation: Secondary | ICD-10-CM | POA: Diagnosis not present

## 2012-01-06 DIAGNOSIS — M6281 Muscle weakness (generalized): Secondary | ICD-10-CM | POA: Diagnosis not present

## 2012-01-06 DIAGNOSIS — I10 Essential (primary) hypertension: Secondary | ICD-10-CM | POA: Diagnosis not present

## 2012-01-06 DIAGNOSIS — G20 Parkinson's disease: Secondary | ICD-10-CM | POA: Diagnosis not present

## 2012-01-06 DIAGNOSIS — K219 Gastro-esophageal reflux disease without esophagitis: Secondary | ICD-10-CM | POA: Diagnosis not present

## 2012-01-06 DIAGNOSIS — I509 Heart failure, unspecified: Secondary | ICD-10-CM | POA: Diagnosis not present

## 2012-01-08 ENCOUNTER — Encounter: Payer: Self-pay | Admitting: Internal Medicine

## 2012-01-08 ENCOUNTER — Ambulatory Visit (AMBULATORY_SURGERY_CENTER): Payer: Medicare Other | Admitting: Internal Medicine

## 2012-01-08 VITALS — BP 138/79 | HR 84 | Temp 97.8°F | Resp 19 | Ht 64.0 in | Wt 180.0 lb

## 2012-01-08 DIAGNOSIS — K2990 Gastroduodenitis, unspecified, without bleeding: Secondary | ICD-10-CM | POA: Diagnosis not present

## 2012-01-08 DIAGNOSIS — K922 Gastrointestinal hemorrhage, unspecified: Secondary | ICD-10-CM | POA: Diagnosis not present

## 2012-01-08 DIAGNOSIS — Z8719 Personal history of other diseases of the digestive system: Secondary | ICD-10-CM

## 2012-01-08 DIAGNOSIS — K254 Chronic or unspecified gastric ulcer with hemorrhage: Secondary | ICD-10-CM

## 2012-01-08 DIAGNOSIS — I10 Essential (primary) hypertension: Secondary | ICD-10-CM | POA: Diagnosis not present

## 2012-01-08 DIAGNOSIS — K253 Acute gastric ulcer without hemorrhage or perforation: Secondary | ICD-10-CM

## 2012-01-08 DIAGNOSIS — Z8619 Personal history of other infectious and parasitic diseases: Secondary | ICD-10-CM | POA: Diagnosis not present

## 2012-01-08 DIAGNOSIS — R55 Syncope and collapse: Secondary | ICD-10-CM | POA: Diagnosis not present

## 2012-01-08 DIAGNOSIS — K2210 Ulcer of esophagus without bleeding: Secondary | ICD-10-CM

## 2012-01-08 DIAGNOSIS — K2970 Gastritis, unspecified, without bleeding: Secondary | ICD-10-CM | POA: Diagnosis not present

## 2012-01-08 DIAGNOSIS — D649 Anemia, unspecified: Secondary | ICD-10-CM | POA: Diagnosis not present

## 2012-01-08 DIAGNOSIS — K2960 Other gastritis without bleeding: Secondary | ICD-10-CM | POA: Diagnosis not present

## 2012-01-08 DIAGNOSIS — G20 Parkinson's disease: Secondary | ICD-10-CM | POA: Diagnosis not present

## 2012-01-08 MED ORDER — SODIUM CHLORIDE 0.9 % IV SOLN: 500.0000 mL | INTRAVENOUS | Status: DC

## 2012-01-08 NOTE — Progress Notes (Signed)
PT.Informed us once we got her in the room that she ate a banana and drunk water at 7:00 am this morning. DR Rhea Belton made aware and [facility] CRNA aware they decided to delay procedure until end of morning.

## 2012-01-08 NOTE — Progress Notes (Signed)
Patient did not experience any of the following events: a burn prior to discharge; a fall within the facility; wrong site/side/patient/procedure/implant event; or a hospital transfer or hospital admission upon discharge from the facility. (G8907) Patient did not have preoperative order for IV antibiotic SSI prophylaxis. (G8918)  

## 2012-01-08 NOTE — Patient Instructions (Signed)

## 2012-01-08 NOTE — Op Note (Signed)
[facility] [address] [place], [place]  [address]  ENDOSCOPY PROCEDURE REPORT  PATIENT:  [name], [name]  MR#:  [id number] BIRTHDATE:  [birth year], 70 yrs. old  GENDER:  female ENDOSCOPIST:  [name] Caddy. Jailene Cupit, MD  PROCEDURE DATE:  01/08/2012 PROCEDURE:  EGD with biopsy for H. pylori 19147 ASA CLASS:  Class III INDICATIONS:  follow-up of gastric ulcer, history of esophagitis MEDICATIONS:    MAC sedation, administered by CRNA, propofol (Diprivan) 120 mg IV TOPICAL ANESTHETIC:  none  DESCRIPTION OF PROCEDURE:   After the risks benefits and alternatives of the procedure were thoroughly explained, informed consent was obtained.  The LB [id number] endoscope was introduced through the mouth and advanced to the second portion of the duodenum, without limitations.  The instrument was slowly withdrawn as the mucosa was fully examined. <<PROCEDUREIMAGES>>  Irregular Z-line at the gastroesophageal junction 33 cm from the incisors.  Otherwise normal esophagus with healing of previously seen esophagitis.  A hiatal hernia was found.  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  A healed ulcer was noted in the angulus.  Mild gastritis was found. Biopsies of the antrum and body of the stomach were obtained and sent to pathology.  The duodenal bulb was normal in appearance, as was the postbulbar duodenum. Retroflexed views revealed Retroflexion exam demonstrated findings as previously described.    The scope was then withdrawn from the patient and the procedure completed.  COMPLICATIONS:  None  ENDOSCOPIC IMPRESSION: 1) Irregular Z-line at the gastroesophageal junction.  Healed esophagitis. 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 5) Ulcer, healed.  Scar seen on the lesser curve of the stomach  6) Mild gastritis.  Multiple biopsies taken and sent to pathology. 7) Normal duodenum 8) Retroflexion exam demonstrated  findings as previously described.  RECOMMENDATIONS: 1) Await pathology results 2) Continue taking your PPI (antiacid medicine, called pantoprazole) once daily. It is best to be taken 20-30 minutes prior to breakfast meal. 3) Your primary care doctor can continue to monitor your blood counts and iron stores.  Oral iron should continue until iron stores replete.  [name] Caddy. Rhea Belton, MD  CC:  The Patient Bufford Spikes, MD  n. eSIGNEDCarie Caddy. Kayann Maj at 01/08/2012 12:15 PM  Letitia Libra, [phone]

## 2012-01-09 ENCOUNTER — Telehealth: Payer: Self-pay | Admitting: *Deleted

## 2012-01-09 DIAGNOSIS — M6281 Muscle weakness (generalized): Secondary | ICD-10-CM | POA: Diagnosis not present

## 2012-01-09 DIAGNOSIS — K219 Gastro-esophageal reflux disease without esophagitis: Secondary | ICD-10-CM | POA: Diagnosis not present

## 2012-01-09 DIAGNOSIS — I4891 Unspecified atrial fibrillation: Secondary | ICD-10-CM | POA: Diagnosis not present

## 2012-01-09 DIAGNOSIS — G20 Parkinson's disease: Secondary | ICD-10-CM | POA: Diagnosis not present

## 2012-01-09 DIAGNOSIS — I509 Heart failure, unspecified: Secondary | ICD-10-CM | POA: Diagnosis not present

## 2012-01-09 DIAGNOSIS — I10 Essential (primary) hypertension: Secondary | ICD-10-CM | POA: Diagnosis not present

## 2012-01-09 NOTE — Telephone Encounter (Signed)
  Follow up Call-  Call back number 01/08/2012  Post procedure Call Back phone  # [phone]  Permission to leave phone message Yes     Patient questions:  Do you have a fever, pain , or abdominal swelling? no Pain Score  0 *  Have you tolerated food without any problems? yes  Have you been able to return to your normal activities? yes  Do you have any questions about your discharge instructions: Diet   no Medications  no Follow up visit  no  Do you have questions or concerns about your Care? no  Actions: * If pain score is 4 or above: No action needed, pain <4.

## 2012-01-10 DIAGNOSIS — M6281 Muscle weakness (generalized): Secondary | ICD-10-CM | POA: Diagnosis not present

## 2012-01-10 DIAGNOSIS — K219 Gastro-esophageal reflux disease without esophagitis: Secondary | ICD-10-CM | POA: Diagnosis not present

## 2012-01-10 DIAGNOSIS — I509 Heart failure, unspecified: Secondary | ICD-10-CM | POA: Diagnosis not present

## 2012-01-10 DIAGNOSIS — I4891 Unspecified atrial fibrillation: Secondary | ICD-10-CM | POA: Diagnosis not present

## 2012-01-10 DIAGNOSIS — Z79899 Other long term (current) drug therapy: Secondary | ICD-10-CM | POA: Diagnosis not present

## 2012-01-10 DIAGNOSIS — I10 Essential (primary) hypertension: Secondary | ICD-10-CM | POA: Diagnosis not present

## 2012-01-10 DIAGNOSIS — G20 Parkinson's disease: Secondary | ICD-10-CM | POA: Diagnosis not present

## 2012-01-13 DIAGNOSIS — M6281 Muscle weakness (generalized): Secondary | ICD-10-CM | POA: Diagnosis not present

## 2012-01-13 DIAGNOSIS — I10 Essential (primary) hypertension: Secondary | ICD-10-CM | POA: Diagnosis not present

## 2012-01-13 DIAGNOSIS — G20 Parkinson's disease: Secondary | ICD-10-CM | POA: Diagnosis not present

## 2012-01-13 DIAGNOSIS — I4891 Unspecified atrial fibrillation: Secondary | ICD-10-CM | POA: Diagnosis not present

## 2012-01-13 DIAGNOSIS — I509 Heart failure, unspecified: Secondary | ICD-10-CM | POA: Diagnosis not present

## 2012-01-13 DIAGNOSIS — K219 Gastro-esophageal reflux disease without esophagitis: Secondary | ICD-10-CM | POA: Diagnosis not present

## 2012-01-14 DIAGNOSIS — I10 Essential (primary) hypertension: Secondary | ICD-10-CM | POA: Diagnosis not present

## 2012-01-14 DIAGNOSIS — I4891 Unspecified atrial fibrillation: Secondary | ICD-10-CM | POA: Diagnosis not present

## 2012-01-14 DIAGNOSIS — K219 Gastro-esophageal reflux disease without esophagitis: Secondary | ICD-10-CM | POA: Diagnosis not present

## 2012-01-14 DIAGNOSIS — M6281 Muscle weakness (generalized): Secondary | ICD-10-CM | POA: Diagnosis not present

## 2012-01-14 DIAGNOSIS — I509 Heart failure, unspecified: Secondary | ICD-10-CM | POA: Diagnosis not present

## 2012-01-14 DIAGNOSIS — G20 Parkinson's disease: Secondary | ICD-10-CM | POA: Diagnosis not present

## 2012-01-16 ENCOUNTER — Encounter: Payer: Self-pay | Admitting: Internal Medicine

## 2012-01-16 DIAGNOSIS — I10 Essential (primary) hypertension: Secondary | ICD-10-CM | POA: Diagnosis not present

## 2012-01-16 DIAGNOSIS — I4891 Unspecified atrial fibrillation: Secondary | ICD-10-CM | POA: Diagnosis not present

## 2012-01-16 DIAGNOSIS — I509 Heart failure, unspecified: Secondary | ICD-10-CM | POA: Diagnosis not present

## 2012-01-16 DIAGNOSIS — G20 Parkinson's disease: Secondary | ICD-10-CM | POA: Diagnosis not present

## 2012-01-16 DIAGNOSIS — K219 Gastro-esophageal reflux disease without esophagitis: Secondary | ICD-10-CM | POA: Diagnosis not present

## 2012-01-16 DIAGNOSIS — M6281 Muscle weakness (generalized): Secondary | ICD-10-CM | POA: Diagnosis not present

## 2012-01-17 DIAGNOSIS — I4891 Unspecified atrial fibrillation: Secondary | ICD-10-CM | POA: Diagnosis not present

## 2012-01-17 DIAGNOSIS — K219 Gastro-esophageal reflux disease without esophagitis: Secondary | ICD-10-CM | POA: Diagnosis not present

## 2012-01-17 DIAGNOSIS — G20 Parkinson's disease: Secondary | ICD-10-CM | POA: Diagnosis not present

## 2012-01-17 DIAGNOSIS — I10 Essential (primary) hypertension: Secondary | ICD-10-CM | POA: Diagnosis not present

## 2012-01-17 DIAGNOSIS — I509 Heart failure, unspecified: Secondary | ICD-10-CM | POA: Diagnosis not present

## 2012-01-17 DIAGNOSIS — M6281 Muscle weakness (generalized): Secondary | ICD-10-CM | POA: Diagnosis not present

## 2012-01-21 DIAGNOSIS — M6281 Muscle weakness (generalized): Secondary | ICD-10-CM | POA: Diagnosis not present

## 2012-01-21 DIAGNOSIS — I4891 Unspecified atrial fibrillation: Secondary | ICD-10-CM | POA: Diagnosis not present

## 2012-01-21 DIAGNOSIS — G20 Parkinson's disease: Secondary | ICD-10-CM | POA: Diagnosis not present

## 2012-01-21 DIAGNOSIS — I509 Heart failure, unspecified: Secondary | ICD-10-CM | POA: Diagnosis not present

## 2012-01-21 DIAGNOSIS — K219 Gastro-esophageal reflux disease without esophagitis: Secondary | ICD-10-CM | POA: Diagnosis not present

## 2012-01-21 DIAGNOSIS — I10 Essential (primary) hypertension: Secondary | ICD-10-CM | POA: Diagnosis not present

## 2012-01-24 DIAGNOSIS — K219 Gastro-esophageal reflux disease without esophagitis: Secondary | ICD-10-CM | POA: Diagnosis not present

## 2012-01-24 DIAGNOSIS — M6281 Muscle weakness (generalized): Secondary | ICD-10-CM | POA: Diagnosis not present

## 2012-01-24 DIAGNOSIS — I509 Heart failure, unspecified: Secondary | ICD-10-CM | POA: Diagnosis not present

## 2012-01-24 DIAGNOSIS — I10 Essential (primary) hypertension: Secondary | ICD-10-CM | POA: Diagnosis not present

## 2012-01-24 DIAGNOSIS — I4891 Unspecified atrial fibrillation: Secondary | ICD-10-CM | POA: Diagnosis not present

## 2012-01-24 DIAGNOSIS — G20 Parkinson's disease: Secondary | ICD-10-CM | POA: Diagnosis not present

## 2012-01-28 DIAGNOSIS — M6281 Muscle weakness (generalized): Secondary | ICD-10-CM | POA: Diagnosis not present

## 2012-01-28 DIAGNOSIS — I509 Heart failure, unspecified: Secondary | ICD-10-CM | POA: Diagnosis not present

## 2012-01-28 DIAGNOSIS — K219 Gastro-esophageal reflux disease without esophagitis: Secondary | ICD-10-CM | POA: Diagnosis not present

## 2012-01-28 DIAGNOSIS — I4891 Unspecified atrial fibrillation: Secondary | ICD-10-CM | POA: Diagnosis not present

## 2012-01-28 DIAGNOSIS — I10 Essential (primary) hypertension: Secondary | ICD-10-CM | POA: Diagnosis not present

## 2012-01-28 DIAGNOSIS — G20 Parkinson's disease: Secondary | ICD-10-CM | POA: Diagnosis not present

## 2012-01-29 DIAGNOSIS — I509 Heart failure, unspecified: Secondary | ICD-10-CM | POA: Diagnosis not present

## 2012-01-29 DIAGNOSIS — G20 Parkinson's disease: Secondary | ICD-10-CM | POA: Diagnosis not present

## 2012-01-29 DIAGNOSIS — I10 Essential (primary) hypertension: Secondary | ICD-10-CM | POA: Diagnosis not present

## 2012-01-29 DIAGNOSIS — M6281 Muscle weakness (generalized): Secondary | ICD-10-CM | POA: Diagnosis not present

## 2012-01-29 DIAGNOSIS — I4891 Unspecified atrial fibrillation: Secondary | ICD-10-CM | POA: Diagnosis not present

## 2012-01-29 DIAGNOSIS — K219 Gastro-esophageal reflux disease without esophagitis: Secondary | ICD-10-CM | POA: Diagnosis not present

## 2012-01-30 DIAGNOSIS — I10 Essential (primary) hypertension: Secondary | ICD-10-CM | POA: Diagnosis not present

## 2012-01-30 DIAGNOSIS — I509 Heart failure, unspecified: Secondary | ICD-10-CM | POA: Diagnosis not present

## 2012-01-30 DIAGNOSIS — G20 Parkinson's disease: Secondary | ICD-10-CM | POA: Diagnosis not present

## 2012-01-30 DIAGNOSIS — M6281 Muscle weakness (generalized): Secondary | ICD-10-CM | POA: Diagnosis not present

## 2012-01-30 DIAGNOSIS — I4891 Unspecified atrial fibrillation: Secondary | ICD-10-CM | POA: Diagnosis not present

## 2012-01-30 DIAGNOSIS — K219 Gastro-esophageal reflux disease without esophagitis: Secondary | ICD-10-CM | POA: Diagnosis not present

## 2012-02-04 DIAGNOSIS — G20A1 Parkinson's disease without dyskinesia, without mention of fluctuations: Secondary | ICD-10-CM | POA: Diagnosis not present

## 2012-02-04 DIAGNOSIS — I4891 Unspecified atrial fibrillation: Secondary | ICD-10-CM | POA: Diagnosis not present

## 2012-02-04 DIAGNOSIS — I509 Heart failure, unspecified: Secondary | ICD-10-CM | POA: Diagnosis not present

## 2012-02-04 DIAGNOSIS — K219 Gastro-esophageal reflux disease without esophagitis: Secondary | ICD-10-CM | POA: Diagnosis not present

## 2012-02-04 DIAGNOSIS — M6281 Muscle weakness (generalized): Secondary | ICD-10-CM | POA: Diagnosis not present

## 2012-02-04 DIAGNOSIS — G20 Parkinson's disease: Secondary | ICD-10-CM | POA: Diagnosis not present

## 2012-02-04 DIAGNOSIS — M545 Low back pain: Secondary | ICD-10-CM | POA: Diagnosis not present

## 2012-02-04 DIAGNOSIS — I10 Essential (primary) hypertension: Secondary | ICD-10-CM | POA: Diagnosis not present

## 2012-02-06 DIAGNOSIS — I10 Essential (primary) hypertension: Secondary | ICD-10-CM | POA: Diagnosis not present

## 2012-02-06 DIAGNOSIS — K219 Gastro-esophageal reflux disease without esophagitis: Secondary | ICD-10-CM | POA: Diagnosis not present

## 2012-02-06 DIAGNOSIS — M6281 Muscle weakness (generalized): Secondary | ICD-10-CM | POA: Diagnosis not present

## 2012-02-06 DIAGNOSIS — I509 Heart failure, unspecified: Secondary | ICD-10-CM | POA: Diagnosis not present

## 2012-02-06 DIAGNOSIS — I4891 Unspecified atrial fibrillation: Secondary | ICD-10-CM | POA: Diagnosis not present

## 2012-02-06 DIAGNOSIS — G20 Parkinson's disease: Secondary | ICD-10-CM | POA: Diagnosis not present

## 2012-02-11 DIAGNOSIS — I10 Essential (primary) hypertension: Secondary | ICD-10-CM | POA: Diagnosis not present

## 2012-02-11 DIAGNOSIS — I509 Heart failure, unspecified: Secondary | ICD-10-CM | POA: Diagnosis not present

## 2012-02-11 DIAGNOSIS — I4891 Unspecified atrial fibrillation: Secondary | ICD-10-CM | POA: Diagnosis not present

## 2012-02-11 DIAGNOSIS — K219 Gastro-esophageal reflux disease without esophagitis: Secondary | ICD-10-CM | POA: Diagnosis not present

## 2012-02-11 DIAGNOSIS — M6281 Muscle weakness (generalized): Secondary | ICD-10-CM | POA: Diagnosis not present

## 2012-02-11 DIAGNOSIS — G20 Parkinson's disease: Secondary | ICD-10-CM | POA: Diagnosis not present

## 2012-02-12 DIAGNOSIS — M1990 Unspecified osteoarthritis, unspecified site: Secondary | ICD-10-CM | POA: Diagnosis not present

## 2012-02-12 DIAGNOSIS — I509 Heart failure, unspecified: Secondary | ICD-10-CM | POA: Diagnosis not present

## 2012-02-13 DIAGNOSIS — I4891 Unspecified atrial fibrillation: Secondary | ICD-10-CM | POA: Diagnosis not present

## 2012-02-13 DIAGNOSIS — K219 Gastro-esophageal reflux disease without esophagitis: Secondary | ICD-10-CM | POA: Diagnosis not present

## 2012-02-13 DIAGNOSIS — G20 Parkinson's disease: Secondary | ICD-10-CM | POA: Diagnosis not present

## 2012-02-13 DIAGNOSIS — I509 Heart failure, unspecified: Secondary | ICD-10-CM | POA: Diagnosis not present

## 2012-02-13 DIAGNOSIS — I10 Essential (primary) hypertension: Secondary | ICD-10-CM | POA: Diagnosis not present

## 2012-02-13 DIAGNOSIS — M6281 Muscle weakness (generalized): Secondary | ICD-10-CM | POA: Diagnosis not present

## 2012-02-18 DIAGNOSIS — Z79899 Other long term (current) drug therapy: Secondary | ICD-10-CM | POA: Diagnosis not present

## 2012-03-04 DIAGNOSIS — I509 Heart failure, unspecified: Secondary | ICD-10-CM | POA: Diagnosis not present

## 2012-03-12 DIAGNOSIS — A048 Other specified bacterial intestinal infections: Secondary | ICD-10-CM | POA: Diagnosis not present

## 2012-03-12 DIAGNOSIS — G20 Parkinson's disease: Secondary | ICD-10-CM | POA: Diagnosis not present

## 2012-03-12 DIAGNOSIS — E785 Hyperlipidemia, unspecified: Secondary | ICD-10-CM | POA: Diagnosis not present

## 2012-03-12 DIAGNOSIS — G20A1 Parkinson's disease without dyskinesia, without mention of fluctuations: Secondary | ICD-10-CM | POA: Diagnosis not present

## 2012-03-12 DIAGNOSIS — K5900 Constipation, unspecified: Secondary | ICD-10-CM | POA: Diagnosis not present

## 2012-04-10 DIAGNOSIS — IMO0002 Reserved for concepts with insufficient information to code with codable children: Secondary | ICD-10-CM | POA: Diagnosis not present

## 2012-04-10 DIAGNOSIS — M545 Low back pain: Secondary | ICD-10-CM | POA: Diagnosis not present

## 2012-04-10 DIAGNOSIS — Z79899 Other long term (current) drug therapy: Secondary | ICD-10-CM | POA: Diagnosis not present

## 2012-04-10 DIAGNOSIS — G20 Parkinson's disease: Secondary | ICD-10-CM | POA: Diagnosis not present

## 2012-04-13 DIAGNOSIS — G20 Parkinson's disease: Secondary | ICD-10-CM | POA: Diagnosis not present

## 2012-04-13 DIAGNOSIS — G8929 Other chronic pain: Secondary | ICD-10-CM | POA: Diagnosis not present

## 2012-04-13 DIAGNOSIS — M545 Low back pain: Secondary | ICD-10-CM | POA: Diagnosis not present

## 2012-04-16 DIAGNOSIS — G20 Parkinson's disease: Secondary | ICD-10-CM | POA: Diagnosis not present

## 2012-04-16 DIAGNOSIS — M545 Low back pain: Secondary | ICD-10-CM | POA: Diagnosis not present

## 2012-04-16 DIAGNOSIS — G8929 Other chronic pain: Secondary | ICD-10-CM | POA: Diagnosis not present

## 2012-04-18 DIAGNOSIS — G20 Parkinson's disease: Secondary | ICD-10-CM | POA: Diagnosis not present

## 2012-04-18 DIAGNOSIS — M545 Low back pain: Secondary | ICD-10-CM | POA: Diagnosis not present

## 2012-04-18 DIAGNOSIS — G8929 Other chronic pain: Secondary | ICD-10-CM | POA: Diagnosis not present

## 2012-04-20 DIAGNOSIS — M545 Low back pain: Secondary | ICD-10-CM | POA: Diagnosis not present

## 2012-04-20 DIAGNOSIS — G8929 Other chronic pain: Secondary | ICD-10-CM | POA: Diagnosis not present

## 2012-04-20 DIAGNOSIS — G20 Parkinson's disease: Secondary | ICD-10-CM | POA: Diagnosis not present

## 2012-04-21 DIAGNOSIS — G20 Parkinson's disease: Secondary | ICD-10-CM | POA: Diagnosis not present

## 2012-04-21 DIAGNOSIS — M545 Low back pain: Secondary | ICD-10-CM | POA: Diagnosis not present

## 2012-04-21 DIAGNOSIS — G8929 Other chronic pain: Secondary | ICD-10-CM | POA: Diagnosis not present

## 2012-04-22 DIAGNOSIS — G20 Parkinson's disease: Secondary | ICD-10-CM | POA: Diagnosis not present

## 2012-04-22 DIAGNOSIS — G8929 Other chronic pain: Secondary | ICD-10-CM | POA: Diagnosis not present

## 2012-04-22 DIAGNOSIS — M545 Low back pain: Secondary | ICD-10-CM | POA: Diagnosis not present

## 2012-04-23 DIAGNOSIS — G20 Parkinson's disease: Secondary | ICD-10-CM | POA: Diagnosis not present

## 2012-04-23 DIAGNOSIS — G8929 Other chronic pain: Secondary | ICD-10-CM | POA: Diagnosis not present

## 2012-04-23 DIAGNOSIS — M545 Low back pain: Secondary | ICD-10-CM | POA: Diagnosis not present

## 2012-04-24 DIAGNOSIS — G20 Parkinson's disease: Secondary | ICD-10-CM | POA: Diagnosis not present

## 2012-04-24 DIAGNOSIS — M545 Low back pain: Secondary | ICD-10-CM | POA: Diagnosis not present

## 2012-04-24 DIAGNOSIS — G8929 Other chronic pain: Secondary | ICD-10-CM | POA: Diagnosis not present

## 2012-04-27 DIAGNOSIS — G20 Parkinson's disease: Secondary | ICD-10-CM | POA: Diagnosis not present

## 2012-04-27 DIAGNOSIS — G8929 Other chronic pain: Secondary | ICD-10-CM | POA: Diagnosis not present

## 2012-04-27 DIAGNOSIS — M545 Low back pain: Secondary | ICD-10-CM | POA: Diagnosis not present

## 2012-04-28 DIAGNOSIS — I509 Heart failure, unspecified: Secondary | ICD-10-CM | POA: Diagnosis not present

## 2012-04-28 DIAGNOSIS — G20 Parkinson's disease: Secondary | ICD-10-CM | POA: Diagnosis not present

## 2012-04-28 DIAGNOSIS — G8929 Other chronic pain: Secondary | ICD-10-CM | POA: Diagnosis not present

## 2012-04-28 DIAGNOSIS — M545 Low back pain: Secondary | ICD-10-CM | POA: Diagnosis not present

## 2012-04-29 DIAGNOSIS — G8929 Other chronic pain: Secondary | ICD-10-CM | POA: Diagnosis not present

## 2012-04-29 DIAGNOSIS — G20 Parkinson's disease: Secondary | ICD-10-CM | POA: Diagnosis not present

## 2012-04-29 DIAGNOSIS — M545 Low back pain: Secondary | ICD-10-CM | POA: Diagnosis not present

## 2012-04-30 DIAGNOSIS — G20 Parkinson's disease: Secondary | ICD-10-CM | POA: Diagnosis not present

## 2012-04-30 DIAGNOSIS — G8929 Other chronic pain: Secondary | ICD-10-CM | POA: Diagnosis not present

## 2012-04-30 DIAGNOSIS — M545 Low back pain: Secondary | ICD-10-CM | POA: Diagnosis not present

## 2012-05-01 DIAGNOSIS — G20 Parkinson's disease: Secondary | ICD-10-CM | POA: Diagnosis not present

## 2012-05-01 DIAGNOSIS — M545 Low back pain: Secondary | ICD-10-CM | POA: Diagnosis not present

## 2012-05-01 DIAGNOSIS — G8929 Other chronic pain: Secondary | ICD-10-CM | POA: Diagnosis not present

## 2012-05-02 DIAGNOSIS — G8929 Other chronic pain: Secondary | ICD-10-CM | POA: Diagnosis not present

## 2012-05-02 DIAGNOSIS — M545 Low back pain: Secondary | ICD-10-CM | POA: Diagnosis not present

## 2012-05-02 DIAGNOSIS — G20 Parkinson's disease: Secondary | ICD-10-CM | POA: Diagnosis not present

## 2012-05-04 DIAGNOSIS — G20 Parkinson's disease: Secondary | ICD-10-CM | POA: Diagnosis not present

## 2012-05-04 DIAGNOSIS — G8929 Other chronic pain: Secondary | ICD-10-CM | POA: Diagnosis not present

## 2012-05-04 DIAGNOSIS — M545 Low back pain: Secondary | ICD-10-CM | POA: Diagnosis not present

## 2012-05-05 DIAGNOSIS — G20 Parkinson's disease: Secondary | ICD-10-CM | POA: Diagnosis not present

## 2012-05-05 DIAGNOSIS — M545 Low back pain: Secondary | ICD-10-CM | POA: Diagnosis not present

## 2012-05-05 DIAGNOSIS — G8929 Other chronic pain: Secondary | ICD-10-CM | POA: Diagnosis not present

## 2012-05-07 DIAGNOSIS — M545 Low back pain: Secondary | ICD-10-CM | POA: Diagnosis not present

## 2012-05-07 DIAGNOSIS — G20 Parkinson's disease: Secondary | ICD-10-CM | POA: Diagnosis not present

## 2012-05-07 DIAGNOSIS — G8929 Other chronic pain: Secondary | ICD-10-CM | POA: Diagnosis not present

## 2012-05-09 DIAGNOSIS — G8929 Other chronic pain: Secondary | ICD-10-CM | POA: Diagnosis not present

## 2012-05-09 DIAGNOSIS — M545 Low back pain: Secondary | ICD-10-CM | POA: Diagnosis not present

## 2012-05-09 DIAGNOSIS — G20 Parkinson's disease: Secondary | ICD-10-CM | POA: Diagnosis not present

## 2012-05-12 DIAGNOSIS — G20 Parkinson's disease: Secondary | ICD-10-CM | POA: Diagnosis not present

## 2012-05-12 DIAGNOSIS — G8929 Other chronic pain: Secondary | ICD-10-CM | POA: Diagnosis not present

## 2012-05-12 DIAGNOSIS — M545 Low back pain: Secondary | ICD-10-CM | POA: Diagnosis not present

## 2012-05-14 DIAGNOSIS — G8929 Other chronic pain: Secondary | ICD-10-CM | POA: Diagnosis not present

## 2012-05-14 DIAGNOSIS — M545 Low back pain: Secondary | ICD-10-CM | POA: Diagnosis not present

## 2012-05-14 DIAGNOSIS — G20 Parkinson's disease: Secondary | ICD-10-CM | POA: Diagnosis not present

## 2012-05-28 DIAGNOSIS — Z79899 Other long term (current) drug therapy: Secondary | ICD-10-CM | POA: Diagnosis not present

## 2012-05-28 DIAGNOSIS — R0602 Shortness of breath: Secondary | ICD-10-CM | POA: Diagnosis not present

## 2012-05-28 DIAGNOSIS — I509 Heart failure, unspecified: Secondary | ICD-10-CM | POA: Diagnosis not present

## 2012-06-10 DIAGNOSIS — M545 Low back pain: Secondary | ICD-10-CM | POA: Diagnosis not present

## 2012-06-10 DIAGNOSIS — G20 Parkinson's disease: Secondary | ICD-10-CM | POA: Diagnosis not present

## 2012-06-17 DIAGNOSIS — Z131 Encounter for screening for diabetes mellitus: Secondary | ICD-10-CM | POA: Diagnosis not present

## 2012-06-17 DIAGNOSIS — E785 Hyperlipidemia, unspecified: Secondary | ICD-10-CM | POA: Diagnosis not present

## 2012-06-17 DIAGNOSIS — I10 Essential (primary) hypertension: Secondary | ICD-10-CM | POA: Diagnosis not present

## 2012-06-18 DIAGNOSIS — L03119 Cellulitis of unspecified part of limb: Secondary | ICD-10-CM | POA: Diagnosis not present

## 2012-06-18 DIAGNOSIS — G20 Parkinson's disease: Secondary | ICD-10-CM | POA: Diagnosis not present

## 2012-06-18 DIAGNOSIS — L02419 Cutaneous abscess of limb, unspecified: Secondary | ICD-10-CM | POA: Diagnosis not present

## 2012-06-18 DIAGNOSIS — G20A1 Parkinson's disease without dyskinesia, without mention of fluctuations: Secondary | ICD-10-CM | POA: Diagnosis not present

## 2012-06-18 DIAGNOSIS — R609 Edema, unspecified: Secondary | ICD-10-CM | POA: Diagnosis not present

## 2012-06-18 DIAGNOSIS — Z23 Encounter for immunization: Secondary | ICD-10-CM | POA: Diagnosis not present

## 2012-06-29 DIAGNOSIS — M5137 Other intervertebral disc degeneration, lumbosacral region: Secondary | ICD-10-CM | POA: Diagnosis not present

## 2012-06-29 DIAGNOSIS — G894 Chronic pain syndrome: Secondary | ICD-10-CM | POA: Diagnosis not present

## 2012-08-11 DIAGNOSIS — M545 Low back pain: Secondary | ICD-10-CM | POA: Diagnosis not present

## 2012-08-11 DIAGNOSIS — G20 Parkinson's disease: Secondary | ICD-10-CM | POA: Diagnosis not present

## 2012-09-24 DIAGNOSIS — E785 Hyperlipidemia, unspecified: Secondary | ICD-10-CM | POA: Diagnosis not present

## 2012-09-24 DIAGNOSIS — R7989 Other specified abnormal findings of blood chemistry: Secondary | ICD-10-CM | POA: Diagnosis not present

## 2012-09-24 DIAGNOSIS — Z131 Encounter for screening for diabetes mellitus: Secondary | ICD-10-CM | POA: Diagnosis not present

## 2012-09-24 DIAGNOSIS — I10 Essential (primary) hypertension: Secondary | ICD-10-CM | POA: Diagnosis not present

## 2012-10-02 DIAGNOSIS — G20A1 Parkinson's disease without dyskinesia, without mention of fluctuations: Secondary | ICD-10-CM | POA: Diagnosis not present

## 2012-10-02 DIAGNOSIS — R609 Edema, unspecified: Secondary | ICD-10-CM | POA: Diagnosis not present

## 2012-10-02 DIAGNOSIS — I5022 Chronic systolic (congestive) heart failure: Secondary | ICD-10-CM | POA: Diagnosis not present

## 2012-10-02 DIAGNOSIS — R7989 Other specified abnormal findings of blood chemistry: Secondary | ICD-10-CM | POA: Diagnosis not present

## 2012-10-02 DIAGNOSIS — G20 Parkinson's disease: Secondary | ICD-10-CM | POA: Diagnosis not present

## 2012-10-14 DIAGNOSIS — I7389 Other specified peripheral vascular diseases: Secondary | ICD-10-CM | POA: Diagnosis not present

## 2012-10-14 DIAGNOSIS — M25569 Pain in unspecified knee: Secondary | ICD-10-CM | POA: Diagnosis not present

## 2012-10-14 DIAGNOSIS — M79609 Pain in unspecified limb: Secondary | ICD-10-CM | POA: Diagnosis not present

## 2012-10-14 DIAGNOSIS — R609 Edema, unspecified: Secondary | ICD-10-CM | POA: Diagnosis not present

## 2012-10-15 DIAGNOSIS — L03119 Cellulitis of unspecified part of limb: Secondary | ICD-10-CM | POA: Diagnosis not present

## 2012-10-15 DIAGNOSIS — L02419 Cutaneous abscess of limb, unspecified: Secondary | ICD-10-CM | POA: Diagnosis not present

## 2012-10-28 DIAGNOSIS — L03119 Cellulitis of unspecified part of limb: Secondary | ICD-10-CM | POA: Diagnosis not present

## 2012-10-28 DIAGNOSIS — L02419 Cutaneous abscess of limb, unspecified: Secondary | ICD-10-CM | POA: Diagnosis not present

## 2012-10-30 DIAGNOSIS — L03119 Cellulitis of unspecified part of limb: Secondary | ICD-10-CM | POA: Diagnosis not present

## 2012-10-30 DIAGNOSIS — L02818 Cutaneous abscess of other sites: Secondary | ICD-10-CM | POA: Diagnosis not present

## 2012-10-30 DIAGNOSIS — Z4800 Encounter for change or removal of nonsurgical wound dressing: Secondary | ICD-10-CM | POA: Diagnosis not present

## 2012-10-30 DIAGNOSIS — L02419 Cutaneous abscess of limb, unspecified: Secondary | ICD-10-CM | POA: Diagnosis not present

## 2012-10-30 DIAGNOSIS — L03818 Cellulitis of other sites: Secondary | ICD-10-CM | POA: Diagnosis not present

## 2012-10-30 DIAGNOSIS — I10 Essential (primary) hypertension: Secondary | ICD-10-CM | POA: Diagnosis not present

## 2012-10-30 DIAGNOSIS — G20 Parkinson's disease: Secondary | ICD-10-CM | POA: Diagnosis not present

## 2012-10-30 DIAGNOSIS — Z8673 Personal history of transient ischemic attack (TIA), and cerebral infarction without residual deficits: Secondary | ICD-10-CM | POA: Diagnosis not present

## 2012-11-02 DIAGNOSIS — G20 Parkinson's disease: Secondary | ICD-10-CM | POA: Diagnosis not present

## 2012-11-02 DIAGNOSIS — L02818 Cutaneous abscess of other sites: Secondary | ICD-10-CM | POA: Diagnosis not present

## 2012-11-02 DIAGNOSIS — I10 Essential (primary) hypertension: Secondary | ICD-10-CM | POA: Diagnosis not present

## 2012-11-02 DIAGNOSIS — Z8673 Personal history of transient ischemic attack (TIA), and cerebral infarction without residual deficits: Secondary | ICD-10-CM | POA: Diagnosis not present

## 2012-11-02 DIAGNOSIS — L03818 Cellulitis of other sites: Secondary | ICD-10-CM | POA: Diagnosis not present

## 2012-11-02 DIAGNOSIS — Z4800 Encounter for change or removal of nonsurgical wound dressing: Secondary | ICD-10-CM | POA: Diagnosis not present

## 2012-11-04 DIAGNOSIS — L02818 Cutaneous abscess of other sites: Secondary | ICD-10-CM | POA: Diagnosis not present

## 2012-11-04 DIAGNOSIS — G20 Parkinson's disease: Secondary | ICD-10-CM | POA: Diagnosis not present

## 2012-11-04 DIAGNOSIS — Z4800 Encounter for change or removal of nonsurgical wound dressing: Secondary | ICD-10-CM | POA: Diagnosis not present

## 2012-11-04 DIAGNOSIS — Z8673 Personal history of transient ischemic attack (TIA), and cerebral infarction without residual deficits: Secondary | ICD-10-CM | POA: Diagnosis not present

## 2012-11-04 DIAGNOSIS — I10 Essential (primary) hypertension: Secondary | ICD-10-CM | POA: Diagnosis not present

## 2012-11-07 ENCOUNTER — Emergency Department (HOSPITAL_COMMUNITY)
Admission: EM | Admit: 2012-11-07 | Discharge: 2012-11-07 | Disposition: A | Discharge status: Home / Self Care | Payer: Medicare Other | Attending: Emergency Medicine | Admitting: Emergency Medicine

## 2012-11-07 ENCOUNTER — Encounter (HOSPITAL_COMMUNITY): Payer: Self-pay | Admitting: Emergency Medicine

## 2012-11-07 ENCOUNTER — Emergency Department (HOSPITAL_COMMUNITY): Payer: Medicare Other

## 2012-11-07 DIAGNOSIS — Z8673 Personal history of transient ischemic attack (TIA), and cerebral infarction without residual deficits: Secondary | ICD-10-CM | POA: Diagnosis not present

## 2012-11-07 DIAGNOSIS — L03818 Cellulitis of other sites: Secondary | ICD-10-CM | POA: Diagnosis not present

## 2012-11-07 DIAGNOSIS — J40 Bronchitis, not specified as acute or chronic: Secondary | ICD-10-CM

## 2012-11-07 DIAGNOSIS — I10 Essential (primary) hypertension: Secondary | ICD-10-CM | POA: Diagnosis not present

## 2012-11-07 DIAGNOSIS — Z872 Personal history of diseases of the skin and subcutaneous tissue: Secondary | ICD-10-CM | POA: Diagnosis not present

## 2012-11-07 DIAGNOSIS — G20 Parkinson's disease: Secondary | ICD-10-CM | POA: Diagnosis not present

## 2012-11-07 DIAGNOSIS — Z8739 Personal history of other diseases of the musculoskeletal system and connective tissue: Secondary | ICD-10-CM | POA: Insufficient documentation

## 2012-11-07 DIAGNOSIS — L02818 Cutaneous abscess of other sites: Secondary | ICD-10-CM | POA: Diagnosis not present

## 2012-11-07 DIAGNOSIS — D649 Anemia, unspecified: Secondary | ICD-10-CM | POA: Insufficient documentation

## 2012-11-07 DIAGNOSIS — Z79899 Other long term (current) drug therapy: Secondary | ICD-10-CM | POA: Diagnosis not present

## 2012-11-07 DIAGNOSIS — G20A1 Parkinson's disease without dyskinesia, without mention of fluctuations: Secondary | ICD-10-CM | POA: Insufficient documentation

## 2012-11-07 DIAGNOSIS — R05 Cough: Secondary | ICD-10-CM | POA: Diagnosis not present

## 2012-11-07 DIAGNOSIS — R0602 Shortness of breath: Secondary | ICD-10-CM | POA: Diagnosis not present

## 2012-11-07 DIAGNOSIS — Z7982 Long term (current) use of aspirin: Secondary | ICD-10-CM | POA: Diagnosis not present

## 2012-11-07 DIAGNOSIS — J209 Acute bronchitis, unspecified: Secondary | ICD-10-CM | POA: Diagnosis not present

## 2012-11-07 DIAGNOSIS — Z4800 Encounter for change or removal of nonsurgical wound dressing: Secondary | ICD-10-CM | POA: Diagnosis not present

## 2012-11-07 DIAGNOSIS — R059 Cough, unspecified: Secondary | ICD-10-CM | POA: Diagnosis not present

## 2012-11-07 MED ORDER — PREDNISONE 20 MG PO TABS: ORAL_TABLET | ORAL | Status: DC

## 2012-11-07 MED ORDER — AZITHROMYCIN 250 MG PO TABS: ORAL_TABLET | ORAL | Status: DC

## 2012-11-07 MED ORDER — ALBUTEROL SULFATE (5 MG/ML) 0.5% IN NEBU
5.0000 mg | INHALATION_SOLUTION | Freq: Once | RESPIRATORY_TRACT | Status: AC
  Administered 2012-11-07: 5 mg via RESPIRATORY_TRACT
  Filled 2012-11-07: qty 1

## 2012-11-07 MED ORDER — PREDNISONE 20 MG PO TABS
40.0000 mg | ORAL_TABLET | Freq: Once | ORAL | Status: AC
  Administered 2012-11-07: 40 mg via ORAL
  Filled 2012-11-07 (×2): qty 1

## 2012-11-07 MED ORDER — IPRATROPIUM BROMIDE 0.02 % IN SOLN
0.5000 mg | Freq: Once | RESPIRATORY_TRACT | Status: AC
  Administered 2012-11-07: 0.5 mg via RESPIRATORY_TRACT
  Filled 2012-11-07: qty 2.5

## 2012-11-07 MED ORDER — ALBUTEROL SULFATE HFA 108 (90 BASE) MCG/ACT IN AERS
2.0000 | INHALATION_SPRAY | Freq: Once | RESPIRATORY_TRACT | Status: AC
  Administered 2012-11-07: 2 via RESPIRATORY_TRACT
  Filled 2012-11-07: qty 6.7

## 2012-11-07 MED ORDER — AZITHROMYCIN 250 MG PO TABS
500.0000 mg | ORAL_TABLET | Freq: Once | ORAL | Status: AC
  Administered 2012-11-07: 500 mg via ORAL
  Filled 2012-11-07: qty 2

## 2012-11-07 NOTE — ED Provider Notes (Addendum)
History     CSN: [id number]  Arrival date & time 11/07/12  4098   First MD Initiated Contact with Patient 11/07/12 1954      Chief Complaint  Patient presents with  . Shortness of Breath    x 3 days  . Cough    clear to white    (Consider location/radiation/quality/duration/timing/severity/associated sxs/prior treatment) HPI .Marland Kitchen...wheezing, cough, dyspnea since Wednesday. No fever sweats or chills. His Parkinson disease and thoracic compression fractures.   Patient is living independently and able to take care of self. Severity is mild to moderate. No radiation of symptoms. Nothing makes symptoms better or worse   Past Medical History  Diagnosis Date  . Parkinson's disease   . Hypertension   . Arthritis   . Anemia   . Cellulitis of lower leg 12/24/2011    Past Surgical History  Procedure Laterality Date  . Hip arthroscopy w/ labral repair    . Thyroidectomy    . Esophagogastroduodenoscopy  09/26/2011    Procedure: ESOPHAGOGASTRODUODENOSCOPY (EGD);  Surgeon: Erick Blinks, MD;  Location: [facility] ENDOSCOPY;  Service: Gastroenterology;  Laterality: N/A;  To be done at bedside.    Family History  Problem Relation Age of Onset  . GER disease Mother   . Heart disease Father     History  Substance Use Topics  . Smoking status: Never Smoker   . Smokeless tobacco: Not on file  . Alcohol Use: No    OB History   Grav Para Term Preterm Abortions TAB SAB Ect Mult Living                  Review of Systems  All other systems reviewed and are negative.    Allergies  Codeine and Penicillins  Home Medications   Current Outpatient Rx  Name  Route  Sig  Dispense  Refill  . albuterol (PROVENTIL) (2.5 MG/3ML) 0.083% nebulizer solution   Nebulization   Take 2.5 mg by nebulization every 6 (six) hours as needed. For shortness of breath         . aspirin EC 81 MG EC tablet   Oral   Take 1 tablet (81 mg total) by mouth daily.         Marland Kitchen b complex vitamins tablet   Oral   Take  1 tablet by mouth daily.           . carbidopa-levodopa (SINEMET CR) 50-200 MG per tablet   Oral   Take 1 tablet by mouth at bedtime.          . carbidopa-levodopa (SINEMET) 25-100 MG per tablet   Oral   Take 2 tablets by mouth 3 (three) times daily.         . ferrous sulfate 325 (65 FE) MG tablet   Oral   Take 325 mg by mouth daily.         . furosemide (LASIX) 20 MG tablet   Oral   Take 1 tablet (20 mg total) by mouth 2 (two) times daily.   30 tablet   11   . magnesium oxide (MAG-OX) 400 MG tablet   Oral   Take 400 mg by mouth daily.          . nitroGLYCERIN (NITROSTAT) 0.4 MG SL tablet   Sublingual   Place 1 tablet (0.4 mg total) under the tongue every 5 (five) minutes x 3 doses as needed for chest pain.   25 tablet   4   . potassium chloride  SA (K-DUR,KLOR-CON) 20 MEQ tablet   Oral   Take 20 mEq by mouth every other day.          . pramipexole (MIRAPEX) 1.5 MG tablet   Oral   Take 1.5 mg by mouth 3 (three) times daily.          Marland Kitchen saccharomyces boulardii (FLORASTOR) 250 MG capsule   Oral   Take 250 mg by mouth 2 (two) times daily.         Marland Kitchen SALINE NASAL SPRAY NA   Nasal   Place 1 spray into the nose 3 (three) times daily as needed. For congestion         . sennosides-docusate sodium (SENOKOT-S) 8.6-50 MG tablet   Oral   Take 2 tablets by mouth daily as needed. For constipation         . sulfamethoxazole-trimethoprim (BACTRIM DS,SEPTRA DS) 800-160 MG per tablet   Oral   Take 1 tablet by mouth 2 (two) times daily. x10 days starting 2/5         . azithromycin (ZITHROMAX) 250 MG tablet      Take 1 tablet daily starting tomorrow evening for 4 more days   6 each   0   . predniSONE (DELTASONE) 20 MG tablet      3 tabs po day one, then 2 tabs daily x 4 days   11 tablet   0     Pulse 93  Temp(Src) 98.4 F (36.9 C) (Oral)  Resp 24  SpO2 90%  Physical Exam  Nursing note and vitals reviewed. Constitutional: She is oriented to  person, place, and time. She appears well-developed and well-nourished.  HENT:  Head: Normocephalic and atraumatic.  Eyes: Conjunctivae and EOM are normal. Pupils are equal, round, and reactive to light.  Neck: Normal range of motion. Neck supple.  Cardiovascular: Normal rate, regular rhythm and normal heart sounds.   Pulmonary/Chest: Effort normal.  Slight expiratory wheeze  Abdominal: Soft. Bowel sounds are normal.  Musculoskeletal: Normal range of motion.  Neurological: She is alert and oriented to person, place, and time.  Skin: Skin is warm and dry.  Psychiatric: She has a normal mood and affect.    ED Course  Procedures (including critical care time)  Labs Reviewed - No data to display Dg Chest 2 View  11/07/2012  *RADIOLOGY REPORT*  Clinical Data: Shortness of breath and cough  CHEST - 2 VIEW  Comparison: 12/20/2011  Findings: Moderate cardiac enlargement is noted.  Lung volumes are normal.  There is no pleural effusion or edema.  No airspace consolidation identified.  Thoracolumbar spine is kyphotic with multiple compression fractures noted.  IMPRESSION:  1.  No acute cardiopulmonary abnormalities. 2.  Multilevel thoracic compression deformities.   Original Report Authenticated By: Signa Kell, M.D.      1. Bronchitis   2. Parkinson disease       MDM  Chest x-ray shows no pneumonia. Discharge home on Zithromax, prednisone, albuterol. Patient is immunocompromised.        Donnetta Hutching, MD 11/07/12 2317  Donnetta Hutching, MD 11/20/12 1332  Donnetta Hutching, MD 11/20/12 [phone]

## 2012-11-07 NOTE — ED Notes (Addendum)
Pt has hx parkinsons started feeling sob with cough on Wednesday. Exp wheezes noted

## 2012-11-09 DIAGNOSIS — Z4800 Encounter for change or removal of nonsurgical wound dressing: Secondary | ICD-10-CM | POA: Diagnosis not present

## 2012-11-09 DIAGNOSIS — G20 Parkinson's disease: Secondary | ICD-10-CM | POA: Diagnosis not present

## 2012-11-09 DIAGNOSIS — L02818 Cutaneous abscess of other sites: Secondary | ICD-10-CM | POA: Diagnosis not present

## 2012-11-09 DIAGNOSIS — Z8673 Personal history of transient ischemic attack (TIA), and cerebral infarction without residual deficits: Secondary | ICD-10-CM | POA: Diagnosis not present

## 2012-11-09 DIAGNOSIS — I10 Essential (primary) hypertension: Secondary | ICD-10-CM | POA: Diagnosis not present

## 2012-11-11 DIAGNOSIS — J209 Acute bronchitis, unspecified: Secondary | ICD-10-CM | POA: Diagnosis not present

## 2012-11-11 DIAGNOSIS — M6281 Muscle weakness (generalized): Secondary | ICD-10-CM | POA: Diagnosis not present

## 2012-12-25 DIAGNOSIS — G894 Chronic pain syndrome: Secondary | ICD-10-CM | POA: Diagnosis not present

## 2012-12-25 DIAGNOSIS — Z79899 Other long term (current) drug therapy: Secondary | ICD-10-CM | POA: Diagnosis not present

## 2012-12-25 DIAGNOSIS — M5137 Other intervertebral disc degeneration, lumbosacral region: Secondary | ICD-10-CM | POA: Diagnosis not present

## 2012-12-27 DIAGNOSIS — G20A1 Parkinson's disease without dyskinesia, without mention of fluctuations: Secondary | ICD-10-CM

## 2012-12-27 DIAGNOSIS — J441 Chronic obstructive pulmonary disease with (acute) exacerbation: Secondary | ICD-10-CM

## 2012-12-27 DIAGNOSIS — R609 Edema, unspecified: Secondary | ICD-10-CM | POA: Diagnosis not present

## 2012-12-27 DIAGNOSIS — I10 Essential (primary) hypertension: Secondary | ICD-10-CM | POA: Diagnosis not present

## 2012-12-27 DIAGNOSIS — R269 Unspecified abnormalities of gait and mobility: Secondary | ICD-10-CM

## 2012-12-27 DIAGNOSIS — Z9181 History of falling: Secondary | ICD-10-CM | POA: Diagnosis not present

## 2012-12-27 DIAGNOSIS — M6281 Muscle weakness (generalized): Secondary | ICD-10-CM

## 2012-12-27 DIAGNOSIS — G20 Parkinson's disease: Secondary | ICD-10-CM

## 2012-12-28 DIAGNOSIS — J441 Chronic obstructive pulmonary disease with (acute) exacerbation: Secondary | ICD-10-CM | POA: Diagnosis not present

## 2012-12-28 DIAGNOSIS — I10 Essential (primary) hypertension: Secondary | ICD-10-CM | POA: Diagnosis not present

## 2012-12-28 DIAGNOSIS — R609 Edema, unspecified: Secondary | ICD-10-CM | POA: Diagnosis not present

## 2012-12-28 DIAGNOSIS — G20 Parkinson's disease: Secondary | ICD-10-CM | POA: Diagnosis not present

## 2012-12-28 DIAGNOSIS — R269 Unspecified abnormalities of gait and mobility: Secondary | ICD-10-CM | POA: Diagnosis not present

## 2012-12-28 DIAGNOSIS — M6281 Muscle weakness (generalized): Secondary | ICD-10-CM | POA: Diagnosis not present

## 2012-12-31 DIAGNOSIS — R609 Edema, unspecified: Secondary | ICD-10-CM | POA: Diagnosis not present

## 2012-12-31 DIAGNOSIS — M6281 Muscle weakness (generalized): Secondary | ICD-10-CM | POA: Diagnosis not present

## 2012-12-31 DIAGNOSIS — R269 Unspecified abnormalities of gait and mobility: Secondary | ICD-10-CM | POA: Diagnosis not present

## 2012-12-31 DIAGNOSIS — J441 Chronic obstructive pulmonary disease with (acute) exacerbation: Secondary | ICD-10-CM | POA: Diagnosis not present

## 2012-12-31 DIAGNOSIS — G20 Parkinson's disease: Secondary | ICD-10-CM | POA: Diagnosis not present

## 2012-12-31 DIAGNOSIS — I10 Essential (primary) hypertension: Secondary | ICD-10-CM | POA: Diagnosis not present

## 2013-01-01 ENCOUNTER — Encounter: Payer: Self-pay | Admitting: Geriatric Medicine

## 2013-01-01 DIAGNOSIS — M6281 Muscle weakness (generalized): Secondary | ICD-10-CM | POA: Diagnosis not present

## 2013-01-01 DIAGNOSIS — R609 Edema, unspecified: Secondary | ICD-10-CM | POA: Diagnosis not present

## 2013-01-01 DIAGNOSIS — J441 Chronic obstructive pulmonary disease with (acute) exacerbation: Secondary | ICD-10-CM | POA: Diagnosis not present

## 2013-01-01 DIAGNOSIS — I10 Essential (primary) hypertension: Secondary | ICD-10-CM | POA: Diagnosis not present

## 2013-01-01 DIAGNOSIS — G20 Parkinson's disease: Secondary | ICD-10-CM | POA: Diagnosis not present

## 2013-01-01 DIAGNOSIS — R269 Unspecified abnormalities of gait and mobility: Secondary | ICD-10-CM | POA: Diagnosis not present

## 2013-01-04 ENCOUNTER — Ambulatory Visit (INDEPENDENT_AMBULATORY_CARE_PROVIDER_SITE_OTHER): Payer: Medicare Other | Admitting: Internal Medicine

## 2013-01-04 ENCOUNTER — Encounter: Payer: Self-pay | Admitting: Internal Medicine

## 2013-01-04 VITALS — BP 134/86 | HR 68 | Ht 62.0 in | Wt 184.0 lb

## 2013-01-04 DIAGNOSIS — I519 Heart disease, unspecified: Secondary | ICD-10-CM

## 2013-01-04 DIAGNOSIS — M6281 Muscle weakness (generalized): Secondary | ICD-10-CM | POA: Diagnosis not present

## 2013-01-04 DIAGNOSIS — R269 Unspecified abnormalities of gait and mobility: Secondary | ICD-10-CM | POA: Diagnosis not present

## 2013-01-04 DIAGNOSIS — D509 Iron deficiency anemia, unspecified: Secondary | ICD-10-CM

## 2013-01-04 DIAGNOSIS — R0609 Other forms of dyspnea: Secondary | ICD-10-CM

## 2013-01-04 DIAGNOSIS — R0600 Dyspnea, unspecified: Secondary | ICD-10-CM

## 2013-01-04 DIAGNOSIS — G20 Parkinson's disease: Secondary | ICD-10-CM

## 2013-01-04 DIAGNOSIS — R609 Edema, unspecified: Secondary | ICD-10-CM | POA: Diagnosis not present

## 2013-01-04 DIAGNOSIS — G20A1 Parkinson's disease without dyskinesia, without mention of fluctuations: Secondary | ICD-10-CM

## 2013-01-04 DIAGNOSIS — I10 Essential (primary) hypertension: Secondary | ICD-10-CM | POA: Diagnosis not present

## 2013-01-04 DIAGNOSIS — J441 Chronic obstructive pulmonary disease with (acute) exacerbation: Secondary | ICD-10-CM | POA: Diagnosis not present

## 2013-01-04 DIAGNOSIS — I5189 Other ill-defined heart diseases: Secondary | ICD-10-CM

## 2013-01-04 MED ORDER — IPRATROPIUM-ALBUTEROL 0.5-2.5 (3) MG/3ML IN SOLN: 3.0000 mL | RESPIRATORY_TRACT | Status: DC

## 2013-01-04 NOTE — Assessment & Plan Note (Signed)
No further chest pains since last month with CAP.

## 2013-01-04 NOTE — Assessment & Plan Note (Signed)
Still getting short of breath very easily after bronchitis episode.  Swelling of legs is about the same at this time.  Very hard to get compression hose on and they are helping.

## 2013-01-04 NOTE — Assessment & Plan Note (Signed)
Increased tremors and is now stuttering.  Next appt with neuro is in may.  Denies any syncopal episodes.  Feels overall weak since her bronchitis, but no focal weakness, numbness.

## 2013-01-04 NOTE — Progress Notes (Signed)
Patient ID: [name], female   DOB: [birth year], 72 y.o.   MRN: [id number] Code Status: full code--I need to discuss her goals of care more next visit.  Allergies  Allergen Reactions  . Codeine Other (See Comments)    unknown  . Penicillins Other (See Comments)    unknown    Chief Complaint  Patient presents with  . Medical Managment of Chronic Issues    Parkinson, blood pressure, low back pain, edema    HPI: Patient is a 72 y.o. white female seen in the office today for med mgt of her chronic diseases.  See problems below.    Review of Systems:  Review of Systems  Constitutional: Negative for fever and weight loss.  HENT: Negative for congestion.   Eyes: Negative for blurred vision.  Respiratory: Positive for shortness of breath.   Cardiovascular: Negative for chest pain.  Gastrointestinal: Positive for heartburn and constipation. Negative for blood in stool and melena.  Genitourinary: Negative for dysuria.  Musculoskeletal: Positive for back pain. Negative for falls.  Skin: Negative for rash.  Neurological: Negative for dizziness, focal weakness and headaches.  Endo/Heme/Allergies: Bruises/bleeds easily.  Psychiatric/Behavioral: Negative for depression and memory loss.    Past Medical History  Diagnosis Date  . Parkinson's disease   . Hypertension   . Arthritis   . Anemia   . Cellulitis of lower leg 12/24/2011  . Acute bronchitis   . Other abnormal blood chemistry   . Chronic systolic heart failure   . Acute on chronic systolic heart failure   . Cellulitis and abscess of leg, except foot   . Hypotension, unspecified   . Helicobacter pylori (H. pylori)   . Acute gastric ulcer with hemorrhage, without mention of obstruction    Past Surgical History  Procedure Laterality Date  . Hip arthroscopy w/ labral repair    . Thyroidectomy    . Esophagogastroduodenoscopy  09/26/2011    Procedure: ESOPHAGOGASTRODUODENOSCOPY (EGD);  Surgeon: Erick Blinks, MD;  Location: [facility]  ENDOSCOPY;  Service: Gastroenterology;  Laterality: N/A;  To be done at bedside.   Social History:   reports that she has never smoked. She has never used smokeless tobacco. She reports that she does not drink alcohol or use illicit drugs.  Family History  Problem Relation Age of Onset  . GER disease Mother   . Heart disease Father     Medications: Patient's Medications  New Prescriptions   No medications on file  Previous Medications   ALBUTEROL (PROVENTIL) (2.5 MG/3ML) 0.083% NEBULIZER SOLUTION    Take 2.5 mg by nebulization every 6 (six) hours as needed. For shortness of breath   B COMPLEX VITAMINS TABLET    Take 1 tablet by mouth daily.     CARBIDOPA-LEVODOPA (SINEMET CR) 50-200 MG PER TABLET    Take 1 tablet by mouth at bedtime. 10pm   CARBIDOPA-LEVODOPA (SINEMET IR) 25-100 MG PER TABLET    Take 1.5 tablets by mouth 4 (four) times daily. 6am, 10am, 2pm and 6pm   FERROUS SULFATE 325 (65 FE) MG TABLET    Take 325 mg by mouth daily.   MAGNESIUM OXIDE (MAG-OX) 400 MG TABLET    Take 400 mg by mouth daily.    POTASSIUM CHLORIDE SA (K-DUR,KLOR-CON) 20 MEQ TABLET    Take 20 mEq by mouth every other day.    PRAMIPEXOLE (MIRAPEX) 1.5 MG TABLET    Take 1.5 mg by mouth 3 (three) times daily.    SACCHAROMYCES BOULARDII (FLORASTOR) 250 MG  CAPSULE    Take 250 mg by mouth 2 (two) times daily.   SALINE NASAL SPRAY NA    Place 1 spray into the nose 3 (three) times daily as needed. For congestion   SENNOSIDES-DOCUSATE SODIUM (SENOKOT-S) 8.6-50 MG TABLET    Take 2 tablets by mouth daily as needed. For constipation   SULFAMETHOXAZOLE-TRIMETHOPRIM (BACTRIM DS,SEPTRA DS) 800-160 MG PER TABLET    Take 1 tablet by mouth 2 (two) times daily. x10 days starting 2/5  Modified Medications   Modified Medication Previous Medication   FUROSEMIDE (LASIX) 20 MG TABLET furosemide (LASIX) 20 MG tablet      Take 40 mg by mouth 2 (two) times daily.    Take 1 tablet (20 mg total) by mouth 2 (two) times daily.   Discontinued Medications   AZITHROMYCIN (ZITHROMAX) 250 MG TABLET    Take 1 tablet daily starting tomorrow evening for 4 more days   CARBIDOPA-LEVODOPA (SINEMET) 25-100 MG PER TABLET    Take 2 tablets by mouth 3 (three) times daily.   PREDNISONE (DELTASONE) 20 MG TABLET    3 tabs po day one, then 2 tabs daily x 4 days     Physical Exam: Filed Vitals:   01/04/13 1046  BP: 134/86  Pulse: 68  Height: 5\' 2"  (1.575 m)  Weight: 184 lb (83.462 kg)   Physical Exam  Constitutional: She is oriented to person, place, and time.  Chronically ill white female, NAD, overweight  HENT:  Head: Normocephalic and atraumatic.  Eyes: EOM are normal. Pupils are equal, round, and reactive to light.  Cardiovascular: Normal rate, regular rhythm and intact distal pulses.   Pulmonary/Chest: Effort normal. She has wheezes.  Abdominal: Soft. Bowel sounds are normal. She exhibits no distension and no mass. There is no tenderness.  Neurological: She is alert and oriented to person, place, and time.  Resting tremor of upper extremities, also some akathisia  Skin: Skin is warm and dry.  Chronically erythematous, warm lower legs, no drainage present or ulceration at this time  Psychiatric: She has a normal mood and affect.      Labs reviewed:  Past Procedures: 11/22/10 Mammogram Bilateral - No specific mammographic evidence of malignancy. 12/10/11 DG Chest 2 View - Stable cardiomegaly with pulmonary vascular congestion. Low lung volumes and left basilar atelectasis. 12/10/11 DG Hip Complete Left - No acute bony findings. 12/13/11 DG Chest Port 1 View - Cardiomegaly without acute disease. 07/18/11 Bone Density  Assessment/Plan Parkinson's disease Increased tremors and is now stuttering.  Next appt with neuro is in may.  Denies any syncopal episodes.  Feels overall weak since her bronchitis, but no focal weakness, numbness.    Diastolic dysfunction, grade 2 Still getting short of breath very easily after  bronchitis episode.  Swelling of legs is about the same at this time.  Very hard to get compression hose on and they are helping.    Chest pain, MI R/O, negative MI- secondary to CAP 12/10/11 No further chest pains since last month with CAP.  was given neb here w/ benefit.    Labs/tests ordered:  Cbc, bmp, iron panel with ferritin, CXR to f/u

## 2013-01-05 DIAGNOSIS — I10 Essential (primary) hypertension: Secondary | ICD-10-CM | POA: Diagnosis not present

## 2013-01-05 DIAGNOSIS — R269 Unspecified abnormalities of gait and mobility: Secondary | ICD-10-CM | POA: Diagnosis not present

## 2013-01-05 DIAGNOSIS — J441 Chronic obstructive pulmonary disease with (acute) exacerbation: Secondary | ICD-10-CM | POA: Diagnosis not present

## 2013-01-05 DIAGNOSIS — R609 Edema, unspecified: Secondary | ICD-10-CM | POA: Diagnosis not present

## 2013-01-05 DIAGNOSIS — G20 Parkinson's disease: Secondary | ICD-10-CM | POA: Diagnosis not present

## 2013-01-05 DIAGNOSIS — M6281 Muscle weakness (generalized): Secondary | ICD-10-CM | POA: Diagnosis not present

## 2013-01-07 DIAGNOSIS — G20 Parkinson's disease: Secondary | ICD-10-CM | POA: Diagnosis not present

## 2013-01-07 DIAGNOSIS — I10 Essential (primary) hypertension: Secondary | ICD-10-CM | POA: Diagnosis not present

## 2013-01-07 DIAGNOSIS — M6281 Muscle weakness (generalized): Secondary | ICD-10-CM | POA: Diagnosis not present

## 2013-01-07 DIAGNOSIS — R609 Edema, unspecified: Secondary | ICD-10-CM | POA: Diagnosis not present

## 2013-01-07 DIAGNOSIS — R269 Unspecified abnormalities of gait and mobility: Secondary | ICD-10-CM | POA: Diagnosis not present

## 2013-01-07 DIAGNOSIS — J441 Chronic obstructive pulmonary disease with (acute) exacerbation: Secondary | ICD-10-CM | POA: Diagnosis not present

## 2013-01-12 DIAGNOSIS — I10 Essential (primary) hypertension: Secondary | ICD-10-CM | POA: Diagnosis not present

## 2013-01-12 DIAGNOSIS — M6281 Muscle weakness (generalized): Secondary | ICD-10-CM | POA: Diagnosis not present

## 2013-01-12 DIAGNOSIS — R609 Edema, unspecified: Secondary | ICD-10-CM | POA: Diagnosis not present

## 2013-01-12 DIAGNOSIS — J441 Chronic obstructive pulmonary disease with (acute) exacerbation: Secondary | ICD-10-CM | POA: Diagnosis not present

## 2013-01-12 DIAGNOSIS — G20 Parkinson's disease: Secondary | ICD-10-CM | POA: Diagnosis not present

## 2013-01-12 DIAGNOSIS — R269 Unspecified abnormalities of gait and mobility: Secondary | ICD-10-CM | POA: Diagnosis not present

## 2013-01-14 DIAGNOSIS — G20 Parkinson's disease: Secondary | ICD-10-CM | POA: Diagnosis not present

## 2013-01-14 DIAGNOSIS — I10 Essential (primary) hypertension: Secondary | ICD-10-CM | POA: Diagnosis not present

## 2013-01-14 DIAGNOSIS — R269 Unspecified abnormalities of gait and mobility: Secondary | ICD-10-CM | POA: Diagnosis not present

## 2013-01-14 DIAGNOSIS — M6281 Muscle weakness (generalized): Secondary | ICD-10-CM | POA: Diagnosis not present

## 2013-01-14 DIAGNOSIS — R609 Edema, unspecified: Secondary | ICD-10-CM | POA: Diagnosis not present

## 2013-01-14 DIAGNOSIS — J441 Chronic obstructive pulmonary disease with (acute) exacerbation: Secondary | ICD-10-CM | POA: Diagnosis not present

## 2013-01-18 DIAGNOSIS — R269 Unspecified abnormalities of gait and mobility: Secondary | ICD-10-CM | POA: Diagnosis not present

## 2013-01-18 DIAGNOSIS — R609 Edema, unspecified: Secondary | ICD-10-CM | POA: Diagnosis not present

## 2013-01-18 DIAGNOSIS — M6281 Muscle weakness (generalized): Secondary | ICD-10-CM | POA: Diagnosis not present

## 2013-01-18 DIAGNOSIS — J441 Chronic obstructive pulmonary disease with (acute) exacerbation: Secondary | ICD-10-CM | POA: Diagnosis not present

## 2013-01-18 DIAGNOSIS — G20 Parkinson's disease: Secondary | ICD-10-CM | POA: Diagnosis not present

## 2013-01-18 DIAGNOSIS — I10 Essential (primary) hypertension: Secondary | ICD-10-CM | POA: Diagnosis not present

## 2013-01-19 DIAGNOSIS — R269 Unspecified abnormalities of gait and mobility: Secondary | ICD-10-CM | POA: Diagnosis not present

## 2013-01-19 DIAGNOSIS — G20 Parkinson's disease: Secondary | ICD-10-CM | POA: Diagnosis not present

## 2013-01-19 DIAGNOSIS — J441 Chronic obstructive pulmonary disease with (acute) exacerbation: Secondary | ICD-10-CM | POA: Diagnosis not present

## 2013-01-19 DIAGNOSIS — R609 Edema, unspecified: Secondary | ICD-10-CM | POA: Diagnosis not present

## 2013-01-19 DIAGNOSIS — M6281 Muscle weakness (generalized): Secondary | ICD-10-CM | POA: Diagnosis not present

## 2013-01-19 DIAGNOSIS — I10 Essential (primary) hypertension: Secondary | ICD-10-CM | POA: Diagnosis not present

## 2013-01-20 DIAGNOSIS — M6281 Muscle weakness (generalized): Secondary | ICD-10-CM | POA: Diagnosis not present

## 2013-01-20 DIAGNOSIS — R609 Edema, unspecified: Secondary | ICD-10-CM | POA: Diagnosis not present

## 2013-01-20 DIAGNOSIS — J441 Chronic obstructive pulmonary disease with (acute) exacerbation: Secondary | ICD-10-CM | POA: Diagnosis not present

## 2013-01-20 DIAGNOSIS — I10 Essential (primary) hypertension: Secondary | ICD-10-CM | POA: Diagnosis not present

## 2013-01-20 DIAGNOSIS — R269 Unspecified abnormalities of gait and mobility: Secondary | ICD-10-CM | POA: Diagnosis not present

## 2013-01-20 DIAGNOSIS — G20 Parkinson's disease: Secondary | ICD-10-CM | POA: Diagnosis not present

## 2013-01-21 DIAGNOSIS — G20 Parkinson's disease: Secondary | ICD-10-CM | POA: Diagnosis not present

## 2013-01-21 DIAGNOSIS — R609 Edema, unspecified: Secondary | ICD-10-CM | POA: Diagnosis not present

## 2013-01-21 DIAGNOSIS — I10 Essential (primary) hypertension: Secondary | ICD-10-CM | POA: Diagnosis not present

## 2013-01-21 DIAGNOSIS — M6281 Muscle weakness (generalized): Secondary | ICD-10-CM | POA: Diagnosis not present

## 2013-01-21 DIAGNOSIS — J441 Chronic obstructive pulmonary disease with (acute) exacerbation: Secondary | ICD-10-CM | POA: Diagnosis not present

## 2013-01-21 DIAGNOSIS — R269 Unspecified abnormalities of gait and mobility: Secondary | ICD-10-CM | POA: Diagnosis not present

## 2013-01-25 DIAGNOSIS — M6281 Muscle weakness (generalized): Secondary | ICD-10-CM | POA: Diagnosis not present

## 2013-01-25 DIAGNOSIS — G20 Parkinson's disease: Secondary | ICD-10-CM | POA: Diagnosis not present

## 2013-01-25 DIAGNOSIS — R609 Edema, unspecified: Secondary | ICD-10-CM | POA: Diagnosis not present

## 2013-01-25 DIAGNOSIS — R269 Unspecified abnormalities of gait and mobility: Secondary | ICD-10-CM | POA: Diagnosis not present

## 2013-01-25 DIAGNOSIS — J441 Chronic obstructive pulmonary disease with (acute) exacerbation: Secondary | ICD-10-CM | POA: Diagnosis not present

## 2013-01-25 DIAGNOSIS — I10 Essential (primary) hypertension: Secondary | ICD-10-CM | POA: Diagnosis not present

## 2013-01-26 DIAGNOSIS — G20 Parkinson's disease: Secondary | ICD-10-CM | POA: Diagnosis not present

## 2013-01-26 DIAGNOSIS — R609 Edema, unspecified: Secondary | ICD-10-CM | POA: Diagnosis not present

## 2013-01-26 DIAGNOSIS — M6281 Muscle weakness (generalized): Secondary | ICD-10-CM | POA: Diagnosis not present

## 2013-01-26 DIAGNOSIS — I10 Essential (primary) hypertension: Secondary | ICD-10-CM | POA: Diagnosis not present

## 2013-01-26 DIAGNOSIS — R269 Unspecified abnormalities of gait and mobility: Secondary | ICD-10-CM | POA: Diagnosis not present

## 2013-01-26 DIAGNOSIS — J441 Chronic obstructive pulmonary disease with (acute) exacerbation: Secondary | ICD-10-CM | POA: Diagnosis not present

## 2013-01-27 DIAGNOSIS — I10 Essential (primary) hypertension: Secondary | ICD-10-CM | POA: Diagnosis not present

## 2013-01-27 DIAGNOSIS — G20 Parkinson's disease: Secondary | ICD-10-CM | POA: Diagnosis not present

## 2013-01-27 DIAGNOSIS — M6281 Muscle weakness (generalized): Secondary | ICD-10-CM | POA: Diagnosis not present

## 2013-01-27 DIAGNOSIS — R609 Edema, unspecified: Secondary | ICD-10-CM | POA: Diagnosis not present

## 2013-01-27 DIAGNOSIS — J441 Chronic obstructive pulmonary disease with (acute) exacerbation: Secondary | ICD-10-CM | POA: Diagnosis not present

## 2013-01-27 DIAGNOSIS — R269 Unspecified abnormalities of gait and mobility: Secondary | ICD-10-CM | POA: Diagnosis not present

## 2013-02-01 DIAGNOSIS — I10 Essential (primary) hypertension: Secondary | ICD-10-CM | POA: Diagnosis not present

## 2013-02-01 DIAGNOSIS — M6281 Muscle weakness (generalized): Secondary | ICD-10-CM | POA: Diagnosis not present

## 2013-02-01 DIAGNOSIS — J441 Chronic obstructive pulmonary disease with (acute) exacerbation: Secondary | ICD-10-CM | POA: Diagnosis not present

## 2013-02-01 DIAGNOSIS — R609 Edema, unspecified: Secondary | ICD-10-CM | POA: Diagnosis not present

## 2013-02-01 DIAGNOSIS — R269 Unspecified abnormalities of gait and mobility: Secondary | ICD-10-CM | POA: Diagnosis not present

## 2013-02-01 DIAGNOSIS — G20 Parkinson's disease: Secondary | ICD-10-CM | POA: Diagnosis not present

## 2013-02-03 ENCOUNTER — Other Ambulatory Visit: Payer: Self-pay | Admitting: Neurology

## 2013-02-03 DIAGNOSIS — M6281 Muscle weakness (generalized): Secondary | ICD-10-CM | POA: Diagnosis not present

## 2013-02-03 DIAGNOSIS — R609 Edema, unspecified: Secondary | ICD-10-CM | POA: Diagnosis not present

## 2013-02-03 DIAGNOSIS — G20 Parkinson's disease: Secondary | ICD-10-CM | POA: Diagnosis not present

## 2013-02-03 DIAGNOSIS — I10 Essential (primary) hypertension: Secondary | ICD-10-CM | POA: Diagnosis not present

## 2013-02-03 DIAGNOSIS — R269 Unspecified abnormalities of gait and mobility: Secondary | ICD-10-CM | POA: Diagnosis not present

## 2013-02-03 DIAGNOSIS — J441 Chronic obstructive pulmonary disease with (acute) exacerbation: Secondary | ICD-10-CM | POA: Diagnosis not present

## 2013-02-04 IMAGING — CR DG CHEST 1V PORT
1 series · 1 of 1 positions shown · non-contrast
Comparison: Chest x-ray 09/26/2011.

CLINICAL DATA: Central line placement.

PORTABLE CHEST - 1 VIEW

[AP]
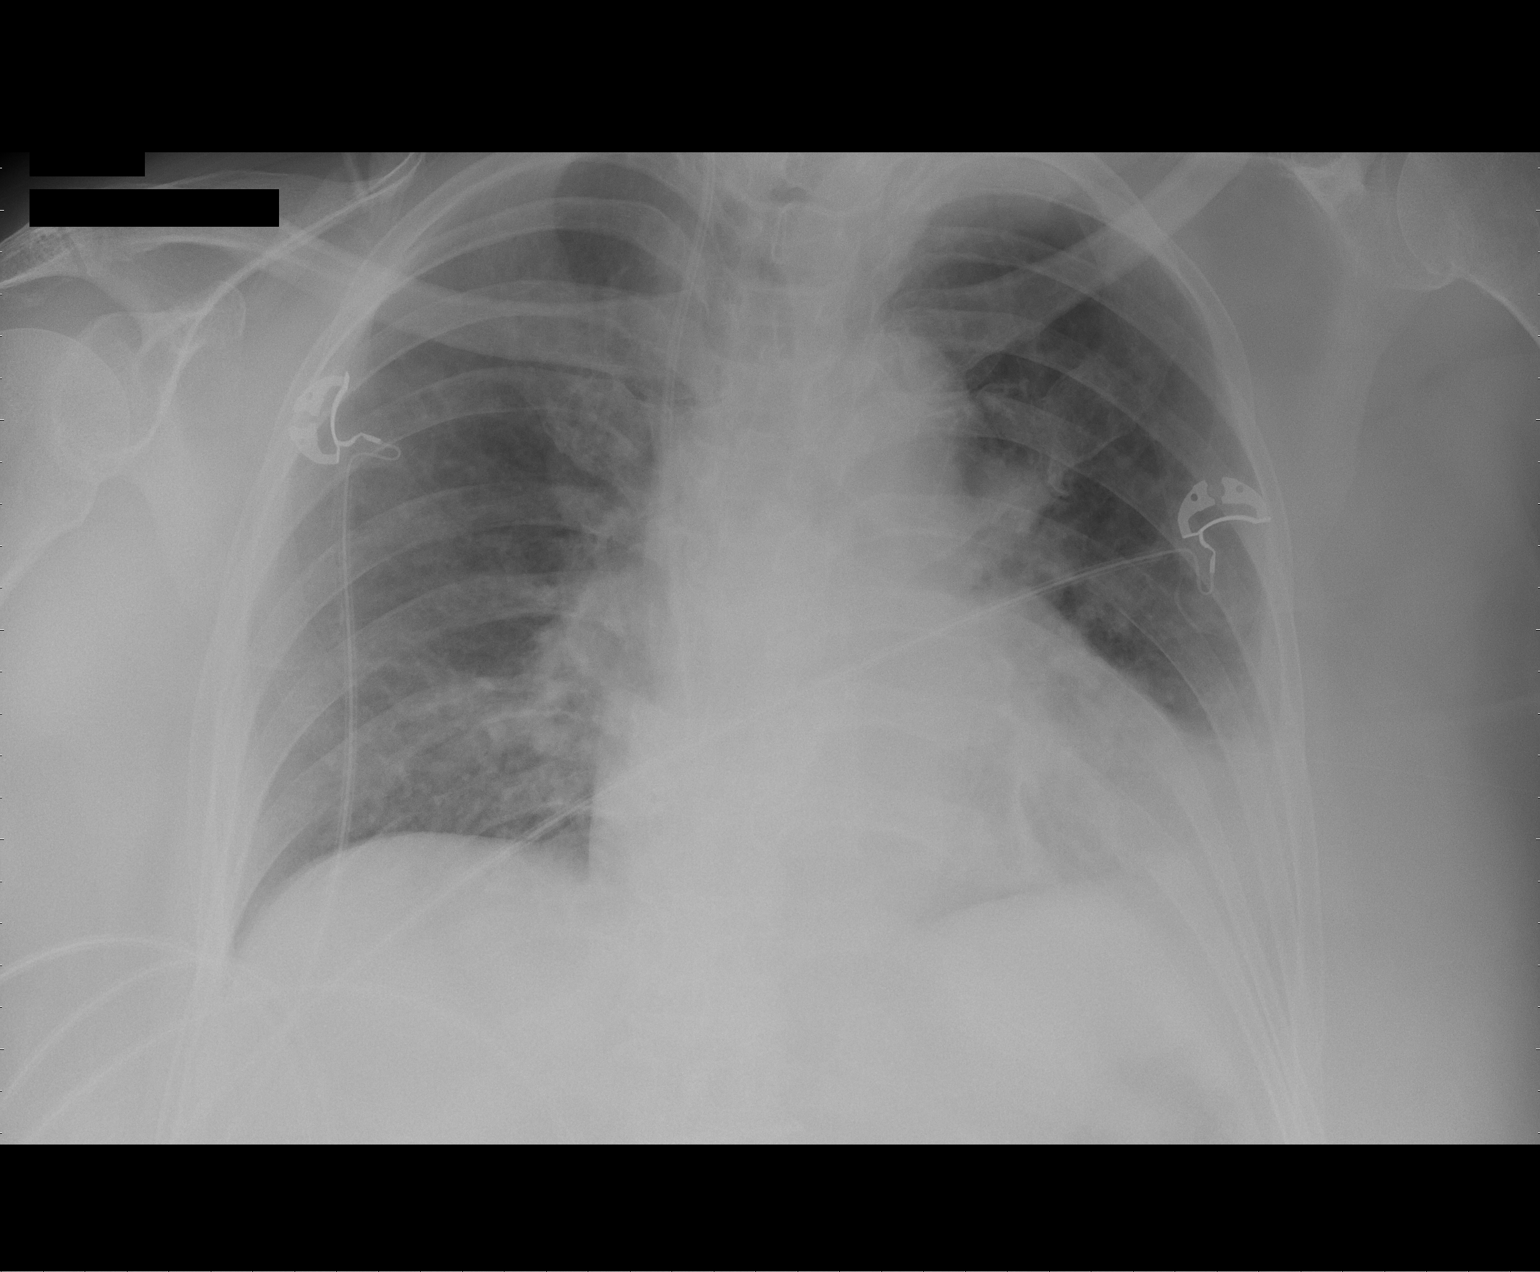

[1 of 1 positions shown; findings below may reference images not displayed]

FINDINGS: The cardiac silhouette, mediastinal and hilar contours
are stable.  Persistent mild vascular congestion.  The right IJ
central venous catheter tip is near the cavoatrial junction.  No
complicating features such as a pneumothorax.
IMPRESSION: Right IJ central venous catheter tip is near the cavoatrial
junction.  No pneumothorax.

## 2013-02-05 ENCOUNTER — Encounter: Payer: Self-pay | Admitting: Nurse Practitioner

## 2013-02-05 ENCOUNTER — Ambulatory Visit (INDEPENDENT_AMBULATORY_CARE_PROVIDER_SITE_OTHER): Payer: Medicare Other | Admitting: Nurse Practitioner

## 2013-02-05 VITALS — BP 115/72 | HR 83 | Ht 61.0 in | Wt 185.0 lb

## 2013-02-05 DIAGNOSIS — G20A1 Parkinson's disease without dyskinesia, without mention of fluctuations: Secondary | ICD-10-CM

## 2013-02-05 DIAGNOSIS — G20 Parkinson's disease: Secondary | ICD-10-CM

## 2013-02-05 NOTE — Patient Instructions (Addendum)
Continue Mirapex 1.5 mg 3 times a day Decrease carbidopa levodopa to 1-1/2 tabs 6 AM, 10 AM, 2 PM, and 6 PM Continue dose at bedtime CR 50/200mg .  Next visit with Dr. Terrace Arabia in 3 months

## 2013-02-05 NOTE — Progress Notes (Signed)
HPI: Patient returns for followup after last visit 08/11/2012. She has a history of Parkinson's disease for 14-1/2 years, also history of hypertension and reported history of stroke. Her initial symptoms were finger stiffness, difficulty manipulating objects, decreased arm swing and right hemisensory loss and gait change. Over the years she has developed typical parking parkinsonian features including resting tremor, bradykinesia, rigidity, and gait difficulty. She presently uses a rolling walker ,she denies any urinary incontinence or bowel incontinence she has a history of low back pain, left buttock pain and she has had compression fractures in the past. She sees Dr. Ethelene Hal. She denies shuffling gait, freezing spells, stiffness, falls, difficulty turning over in bed, she does have swallowing difficulty and tells me she is in speech therapy. She has  hypophonia. Dyskinesias noted of her head and upper extremities.   ROS:  - swelling in legs, SOB, aching muscles, slurred speech  Physical Exam General: well developed, well nourished, seated, in no evident distress Head: head normocephalic and atraumatic. Oropharynx benign Neck: supple with no carotid or supraclavicular bruits Cardiovascular: regular rate and rhythm, no murmurs  Neurologic Exam Mental Status: Awake and fully alert. Oriented to place and time. MMSE 30/30. AFT 15.  Mood and affect appropriate.  Cranial Nerves: Pupils equal, briskly reactive to light. Extraocular movements full without nystagmus. Visual fields full to confrontation. Hearing intact and symmetric to finger snap. Facial sensation intact. Face, tongue, palate move normally and symmetrically. Neck flexion and extension normal. Low volume voice, negative Myerson sign, masking of the face Motor: Normal bulk and tone. Normal strength in all tested extremity muscles. No focal weakness. Mild limb and nuchal rigidity, dyskinesias of the head shoulders and arms. Sensory.: intact to  touch and pinprick and vibratory.  Coordination: No dysmetria . Gait and Station: Arises from chair with  push off  Stance is stooped  Gait demonstrates steppage . Ambulated 70 feet with rolling walker, no difficulty with turns  Reflexes: 2+ and symmetric except absent Achilles. Toes downgoing.     ASSESSMENT: Idiopathic Parkinson's disease with dyskinesias on current dose. She took her medicine at 10 AM at her appointment is at 11:30. She is afraid of falling with her dyskinesias. She has a history of osteopenia per bone density,  history of T12 compression fractures, back pain is managed by Dr. Ethelene Hal.    PLAN: Discussed with Dr. Terrace Arabia. Will decrease Sinemet 25 100-1-1/2 tablets at 6 AM 10 AM 2 PM and 6 PM. Continue Sinemet CR at 10 PM. Continue Mirapex 1.5mg   3 times a day She will followup in 3 months with Dr. Nanine Means  has been added in the past but patient could not afford this   Nilda Riggs, GNP-BC APRN

## 2013-02-08 DIAGNOSIS — G20 Parkinson's disease: Secondary | ICD-10-CM | POA: Diagnosis not present

## 2013-02-08 DIAGNOSIS — R609 Edema, unspecified: Secondary | ICD-10-CM | POA: Diagnosis not present

## 2013-02-08 DIAGNOSIS — R269 Unspecified abnormalities of gait and mobility: Secondary | ICD-10-CM | POA: Diagnosis not present

## 2013-02-08 DIAGNOSIS — I10 Essential (primary) hypertension: Secondary | ICD-10-CM | POA: Diagnosis not present

## 2013-02-08 DIAGNOSIS — M6281 Muscle weakness (generalized): Secondary | ICD-10-CM | POA: Diagnosis not present

## 2013-02-08 DIAGNOSIS — J441 Chronic obstructive pulmonary disease with (acute) exacerbation: Secondary | ICD-10-CM | POA: Diagnosis not present

## 2013-02-09 DIAGNOSIS — R269 Unspecified abnormalities of gait and mobility: Secondary | ICD-10-CM | POA: Diagnosis not present

## 2013-02-09 DIAGNOSIS — M6281 Muscle weakness (generalized): Secondary | ICD-10-CM | POA: Diagnosis not present

## 2013-02-09 DIAGNOSIS — I10 Essential (primary) hypertension: Secondary | ICD-10-CM | POA: Diagnosis not present

## 2013-02-09 DIAGNOSIS — J441 Chronic obstructive pulmonary disease with (acute) exacerbation: Secondary | ICD-10-CM | POA: Diagnosis not present

## 2013-02-09 DIAGNOSIS — G20 Parkinson's disease: Secondary | ICD-10-CM | POA: Diagnosis not present

## 2013-02-09 DIAGNOSIS — R609 Edema, unspecified: Secondary | ICD-10-CM | POA: Diagnosis not present

## 2013-02-10 DIAGNOSIS — G20 Parkinson's disease: Secondary | ICD-10-CM | POA: Diagnosis not present

## 2013-02-10 DIAGNOSIS — M6281 Muscle weakness (generalized): Secondary | ICD-10-CM | POA: Diagnosis not present

## 2013-02-10 DIAGNOSIS — I10 Essential (primary) hypertension: Secondary | ICD-10-CM | POA: Diagnosis not present

## 2013-02-10 DIAGNOSIS — R269 Unspecified abnormalities of gait and mobility: Secondary | ICD-10-CM | POA: Diagnosis not present

## 2013-02-10 DIAGNOSIS — J441 Chronic obstructive pulmonary disease with (acute) exacerbation: Secondary | ICD-10-CM | POA: Diagnosis not present

## 2013-02-10 DIAGNOSIS — R609 Edema, unspecified: Secondary | ICD-10-CM | POA: Diagnosis not present

## 2013-02-16 DIAGNOSIS — R609 Edema, unspecified: Secondary | ICD-10-CM | POA: Diagnosis not present

## 2013-02-16 DIAGNOSIS — G20 Parkinson's disease: Secondary | ICD-10-CM | POA: Diagnosis not present

## 2013-02-16 DIAGNOSIS — J441 Chronic obstructive pulmonary disease with (acute) exacerbation: Secondary | ICD-10-CM | POA: Diagnosis not present

## 2013-02-16 DIAGNOSIS — M6281 Muscle weakness (generalized): Secondary | ICD-10-CM | POA: Diagnosis not present

## 2013-02-16 DIAGNOSIS — R269 Unspecified abnormalities of gait and mobility: Secondary | ICD-10-CM | POA: Diagnosis not present

## 2013-02-16 DIAGNOSIS — I10 Essential (primary) hypertension: Secondary | ICD-10-CM | POA: Diagnosis not present

## 2013-02-18 DIAGNOSIS — G20 Parkinson's disease: Secondary | ICD-10-CM | POA: Diagnosis not present

## 2013-02-18 DIAGNOSIS — R609 Edema, unspecified: Secondary | ICD-10-CM | POA: Diagnosis not present

## 2013-02-18 DIAGNOSIS — R269 Unspecified abnormalities of gait and mobility: Secondary | ICD-10-CM | POA: Diagnosis not present

## 2013-02-18 DIAGNOSIS — J441 Chronic obstructive pulmonary disease with (acute) exacerbation: Secondary | ICD-10-CM | POA: Diagnosis not present

## 2013-02-18 DIAGNOSIS — M6281 Muscle weakness (generalized): Secondary | ICD-10-CM | POA: Diagnosis not present

## 2013-02-18 DIAGNOSIS — I10 Essential (primary) hypertension: Secondary | ICD-10-CM | POA: Diagnosis not present

## 2013-03-04 ENCOUNTER — Ambulatory Visit: Payer: Medicare Other | Admitting: Internal Medicine

## 2013-03-09 ENCOUNTER — Telehealth: Payer: Self-pay

## 2013-03-09 MED ORDER — PRAMIPEXOLE DIHYDROCHLORIDE 1.5 MG PO TABS: 1.5000 mg | ORAL_TABLET | Freq: Three times a day (TID) | ORAL | Status: DC

## 2013-03-09 NOTE — Telephone Encounter (Signed)
Message copied by Malachy Moan on Tue Mar 09, 2013  5:37 PM ------      Message from: Eugenie Birks      Created: Tue Mar 09, 2013  1:40 PM      Contact: patient       Her ins called and they want her meds to be sent to mail order-tramipexole and they needs 90day supply-ins is [employer] ------

## 2013-03-15 ENCOUNTER — Telehealth: Payer: Self-pay

## 2013-03-15 NOTE — Telephone Encounter (Signed)
Message copied by Malachy Moan on Mon Mar 15, 2013  5:32 PM ------      Message from: Philipp Ovens R      Created: Mon Mar 15, 2013  4:47 PM       Pt needs RX sent through mail order ------

## 2013-03-15 NOTE — Telephone Encounter (Signed)
Rx was already sent to mail mail order on 06/17: E-Prescribing Status: Receipt confirmed by pharmacy (03/09/2013 5:45 PM EDT) It usually takes them several days to process request.  I called the patient. She is aware Rx was sent.  She will follow up with her pharmacy and call us back if needed.

## 2013-03-30 ENCOUNTER — Ambulatory Visit: Payer: Medicare Other

## 2013-03-30 DIAGNOSIS — D509 Iron deficiency anemia, unspecified: Secondary | ICD-10-CM

## 2013-03-30 DIAGNOSIS — I519 Heart disease, unspecified: Secondary | ICD-10-CM | POA: Diagnosis not present

## 2013-03-30 DIAGNOSIS — R0609 Other forms of dyspnea: Secondary | ICD-10-CM | POA: Diagnosis not present

## 2013-03-30 DIAGNOSIS — R0600 Dyspnea, unspecified: Secondary | ICD-10-CM

## 2013-03-30 DIAGNOSIS — I5189 Other ill-defined heart diseases: Secondary | ICD-10-CM

## 2013-03-31 LAB — BASIC METABOLIC PANEL
BUN/Creatinine Ratio: 18 (ref 11–26)
BUN: 16 mg/dL (ref 8–27)
CO2: 25 mmol/L (ref 18–29)
Calcium: 9.1 mg/dL (ref 8.6–10.2)
Chloride: 102 mmol/L (ref 97–108)
Creatinine, Ser: 0.91 mg/dL (ref 0.57–1.00)
GFR calc Af Amer: 73 mL/min/{1.73_m2} (ref >59)
GFR calc non Af Amer: 63 mL/min/{1.73_m2} (ref >59)
Glucose: 101 mg/dL — ABNORMAL HIGH (ref 65–99)
Potassium: 4.5 mmol/L (ref 3.5–5.2)
Sodium: 141 mmol/L (ref 134–144)

## 2013-03-31 LAB — CBC WITH DIFFERENTIAL/PLATELET
Basophils Absolute: 0.1 10*3/uL (ref 0.0–0.2)
Basos: 1 % (ref 0–3)
Eos: 2 % (ref 0–5)
Eosinophils Absolute: 0.1 10*3/uL (ref 0.0–0.4)
HCT: 33 % — ABNORMAL LOW (ref 34.0–46.6)
Hemoglobin: 10.2 g/dL — ABNORMAL LOW (ref 11.1–15.9)
Immature Grans (Abs): 0 10*3/uL (ref 0.0–0.1)
Immature Granulocytes: 0 % (ref 0–2)
Lymphocytes Absolute: 1 10*3/uL (ref 0.7–3.1)
Lymphs: 13 % — ABNORMAL LOW (ref 14–46)
MCH: 22.1 pg — ABNORMAL LOW (ref 26.6–33.0)
MCHC: 30.9 g/dL — ABNORMAL LOW (ref 31.5–35.7)
MCV: 72 fL — ABNORMAL LOW (ref 79–97)
Monocytes Absolute: 0.7 10*3/uL (ref 0.1–0.9)
Monocytes: 9 % (ref 4–12)
Neutrophils Absolute: 5.8 10*3/uL (ref 1.4–7.0)
Neutrophils Relative %: 75 % — ABNORMAL HIGH (ref 40–74)
RBC: 4.61 x10E6/uL (ref 3.77–5.28)
RDW: 18.8 % — ABNORMAL HIGH (ref 12.3–15.4)
WBC: 7.6 10*3/uL (ref 3.4–10.8)

## 2013-03-31 LAB — IRON AND TIBC
Iron Saturation: 5 % — CL (ref 15–55)
Iron: 18 ug/dL — ABNORMAL LOW (ref 35–155)
TIBC: 392 ug/dL (ref 250–450)
UIBC: 374 ug/dL (ref 150–375)

## 2013-03-31 LAB — FERRITIN: Ferritin: 8 ng/mL — ABNORMAL LOW (ref 15–150)

## 2013-04-02 ENCOUNTER — Ambulatory Visit: Payer: Medicare Other | Admitting: Internal Medicine

## 2013-04-09 ENCOUNTER — Encounter: Payer: Self-pay | Admitting: Internal Medicine

## 2013-04-09 ENCOUNTER — Ambulatory Visit (INDEPENDENT_AMBULATORY_CARE_PROVIDER_SITE_OTHER): Payer: Medicare Other | Admitting: Internal Medicine

## 2013-04-09 VITALS — BP 136/82 | HR 90 | Temp 98.6°F | Resp 14 | Ht 61.0 in | Wt 178.4 lb

## 2013-04-09 DIAGNOSIS — D509 Iron deficiency anemia, unspecified: Secondary | ICD-10-CM | POA: Insufficient documentation

## 2013-04-09 DIAGNOSIS — R52 Pain, unspecified: Secondary | ICD-10-CM | POA: Diagnosis not present

## 2013-04-09 DIAGNOSIS — G20 Parkinson's disease: Secondary | ICD-10-CM

## 2013-04-09 DIAGNOSIS — G20A1 Parkinson's disease without dyskinesia, without mention of fluctuations: Secondary | ICD-10-CM | POA: Diagnosis not present

## 2013-04-09 DIAGNOSIS — R1031 Right lower quadrant pain: Secondary | ICD-10-CM

## 2013-04-09 MED ORDER — CIPROFLOXACIN HCL 500 MG PO TABS: 500.0000 mg | ORAL_TABLET | Freq: Two times a day (BID) | ORAL | Status: DC

## 2013-04-09 NOTE — Assessment & Plan Note (Signed)
I just asked her to resume her iron supplement b/c her anemia was slightly worse on labs (remains in 10 range) 10 days ago.  I have repeated her cbc today due to her abdominal pain.

## 2013-04-09 NOTE — Assessment & Plan Note (Signed)
Stable.  Follows with neurology.  Continue sinemet and mirapex.

## 2013-04-09 NOTE — Progress Notes (Signed)
Patient ID: [name], female   DOB: [birth year], 72 y.o.   MRN: [id number] Location:  [facility] [place] [facility]  Code Status: DNR, has paperwork in the office.    Allergies  Allergen Reactions  . Codeine Other (See Comments)    unknown  . Penicillins Other (See Comments)    unknown    Chief Complaint  Patient presents with  . Medical Managment of Chronic Issues    HPI: Patient is a 72 y.o. white female seen in the office today for med mgt of chronic illnesses.  Having RLQ abdominal pain across her midabdomen since 6-7pm last night.  Sharp pain on the side and burning over midabdomen.  Took her iron yesterday morning.  Took a laxative and bowels moved normally this morning.  Not nauseous.  No dysuria.  Does have some frequency.  Nocturia unchanged.  No fever, chills.  No increased cough or congestion, no significant leg swelling.    Review of Systems:  Review of Systems  Constitutional: Negative for fever, chills and weight loss.  HENT: Negative for congestion.   Eyes: Negative for blurred vision.  Respiratory: Negative for cough.   Cardiovascular: Negative for chest pain.  Gastrointestinal: Positive for abdominal pain. Negative for heartburn, nausea, vomiting, diarrhea, constipation, blood in stool and melena.  Genitourinary: Positive for urgency, frequency and flank pain. Negative for dysuria and hematuria.  Musculoskeletal: Positive for back pain. Negative for myalgias and falls.  Skin: Negative for rash.  Neurological: Positive for tremors. Negative for dizziness, focal weakness and headaches.       Parkinson's disease  Endo/Heme/Allergies: Bruises/bleeds easily.  Psychiatric/Behavioral: Negative for memory loss.     Past Medical History  Diagnosis Date  . Parkinson's disease   . Hypertension   . Arthritis   . Anemia   . Cellulitis of lower leg 12/24/2011  . Acute bronchitis   . Other abnormal blood chemistry   . Chronic systolic heart  failure   . Acute on chronic systolic heart failure   . Cellulitis and abscess of leg, except foot   . Hypotension, unspecified   . Helicobacter pylori (H. pylori)   . Acute gastric ulcer with hemorrhage, without mention of obstruction     Past Surgical History  Procedure Laterality Date  . Hip arthroscopy w/ labral repair    . Thyroidectomy    . Esophagogastroduodenoscopy  09/26/2011    Procedure: ESOPHAGOGASTRODUODENOSCOPY (EGD);  Surgeon: Erick Blinks, MD;  Location: [facility] ENDOSCOPY;  Service: Gastroenterology;  Laterality: N/A;  To be done at bedside.    Social History:   reports that she has never smoked. She has never used smokeless tobacco. She reports that she does not drink alcohol or use illicit drugs.  Family History  Problem Relation Age of Onset  . GER disease Mother   . Heart disease Father     Medications: Patient's Medications  New Prescriptions   No medications on file  Previous Medications   ALBUTEROL (PROVENTIL) (2.5 MG/3ML) 0.083% NEBULIZER SOLUTION    Take 2.5 mg by nebulization every 6 (six) hours as needed. For shortness of breath   B COMPLEX VITAMINS TABLET    Take 1 tablet by mouth daily.     CARBIDOPA-LEVODOPA (SINEMET CR) 50-200 MG PER TABLET    Take 1 tablet by mouth at bedtime. 10pm   CARBIDOPA-LEVODOPA (SINEMET IR) 25-100 MG PER TABLET    Take 1.5 tablets by mouth 4 (four) times daily. 6am, 10am, 2pm and 6pm  FERROUS SULFATE 325 (65 FE) MG TABLET    Take 325 mg by mouth daily.   FUROSEMIDE (LASIX) 20 MG TABLET    Take 40 mg by mouth 2 (two) times daily.   MAGNESIUM OXIDE (MAG-OX) 400 MG TABLET    Take 400 mg by mouth daily.    POTASSIUM CHLORIDE SA (K-DUR,KLOR-CON) 20 MEQ TABLET    Take 20 mEq by mouth every other day.    PRAMIPEXOLE (MIRAPEX) 1.5 MG TABLET    Take 1 tablet (1.5 mg total) by mouth 3 (three) times daily.   SACCHAROMYCES BOULARDII (FLORASTOR) 250 MG CAPSULE    Take 250 mg by mouth 2 (two) times daily.   SALINE NASAL SPRAY NA    Place 1  spray into the nose 3 (three) times daily as needed. For congestion   SENNOSIDES-DOCUSATE SODIUM (SENOKOT-S) 8.6-50 MG TABLET    Take 2 tablets by mouth daily as needed. For constipation   SULFAMETHOXAZOLE-TRIMETHOPRIM (BACTRIM DS,SEPTRA DS) 800-160 MG PER TABLET    Take 1 tablet by mouth 2 (two) times daily. x10 days starting 2/5  Modified Medications   No medications on file  Discontinued Medications   No medications on file     Physical Exam: Filed Vitals:   04/09/13 1045  BP: 136/82  Pulse: 90  Temp: 98.6 F (37 C)  TempSrc: Oral  Resp: 14  Height: 5\' 1"  (1.549 m)  Weight: 178 lb 6.4 oz (80.922 kg)  Physical Exam  Constitutional: She is oriented to person, place, and time.  Chronically ill white female, NAD  Cardiovascular: Normal rate, regular rhythm, normal heart sounds and intact distal pulses.   Pulmonary/Chest: Effort normal and breath sounds normal.  Abdominal: Soft. Bowel sounds are normal. She exhibits no distension and no mass. There is tenderness. There is no rebound and no guarding. A hernia is present.  umbilical hernia, RLQ to periumbilical and suprapubic tenderness present, tender in right flank, as well  Musculoskeletal:  tremor  Neurological: She is alert and oriented to person, place, and time.  Skin: Skin is warm and dry.  Mild chronic erythema of bilateral lower extremities  Psychiatric: She has a normal mood and affect. Her behavior is normal. Judgment and thought content normal.    Labs reviewed: Basic Metabolic Panel:  Recent Labs  96/04/54 0829  NA 141  K 4.5  CL 102  CO2 25  GLUCOSE 101*  BUN 16  CREATININE 0.91  CALCIUM 9.1  CBC:  Recent Labs  03/30/13 0829  WBC 7.6  NEUTROABS 5.8  HGB 10.2*  HCT 33.0*  MCV 72*  Assessment/Plan 1. Acute right lower quadrant pain - Urinalysis, dipstick only--unclear if she has UTI (had only mild wbc, but symptoms suggestive of pyelonephritis though afebrile this am) - Urine culture - CBC with  Differential - Basic metabolic panel - Hepatic Function Panel - Amylase - Lipase - ciprofloxacin (CIPRO) 500 MG tablet; Take 1 tablet (500 mg total) by mouth 2 (two) times daily.  Dispense: 20 tablet; Refill: 0--was started to treat suspected uti and should also help with diverticulitis if that is the cause --await blood tests as above to r/o hepatic or pancreatic etiology --no red flags on exam Anemia, iron deficiency I just asked her to resume her iron supplement b/c her anemia was slightly worse on labs (remains in 10 range) 10 days ago.  I have repeated her cbc today due to her abdominal pain.    Parkinson's disease Stable.  Follows with neurology.  Continue  sinemet and mirapex.    Labs/tests ordered: urine dipstick, urine culture, cbc, bmp, liver panel, amylase, lipase Next appt:  3 mos

## 2013-04-10 LAB — BASIC METABOLIC PANEL WITH GFR
BUN/Creatinine Ratio: 19 (ref 11–26)
BUN: 17 mg/dL (ref 8–27)
CO2: 25 mmol/L (ref 18–29)
Calcium: 9.3 mg/dL (ref 8.6–10.2)
Chloride: 102 mmol/L (ref 97–108)
Creatinine, Ser: 0.88 mg/dL (ref 0.57–1.00)
GFR calc Af Amer: 76 mL/min/1.73
GFR calc non Af Amer: 66 mL/min/1.73
Glucose: 91 mg/dL (ref 65–99)
Potassium: 4.7 mmol/L (ref 3.5–5.2)
Sodium: 140 mmol/L (ref 134–144)

## 2013-04-10 LAB — AMYLASE: Amylase: 28 U/L — ABNORMAL LOW (ref 31–124)

## 2013-04-10 LAB — CBC WITH DIFFERENTIAL/PLATELET
Basophils Absolute: 0.1 10*3/uL (ref 0.0–0.2)
Basos: 1 % (ref 0–3)
Eos: 1 % (ref 0–5)
Eosinophils Absolute: 0.1 10*3/uL (ref 0.0–0.4)
HCT: 34.6 % (ref 34.0–46.6)
Hemoglobin: 10.4 g/dL — ABNORMAL LOW (ref 11.1–15.9)
Immature Grans (Abs): 0 10*3/uL (ref 0.0–0.1)
Immature Granulocytes: 0 % (ref 0–2)
Lymphocytes Absolute: 0.9 10*3/uL (ref 0.7–3.1)
Lymphs: 9 % — ABNORMAL LOW (ref 14–46)
MCH: 21.7 pg — ABNORMAL LOW (ref 26.6–33.0)
MCHC: 30.1 g/dL — ABNORMAL LOW (ref 31.5–35.7)
MCV: 72 fL — ABNORMAL LOW (ref 79–97)
Monocytes Absolute: 0.9 10*3/uL (ref 0.1–0.9)
Monocytes: 9 % (ref 4–12)
Neutrophils Absolute: 8.2 10*3/uL — ABNORMAL HIGH (ref 1.4–7.0)
Neutrophils Relative %: 80 % — ABNORMAL HIGH (ref 40–74)
RBC: 4.8 x10E6/uL (ref 3.77–5.28)
RDW: 19.2 % — ABNORMAL HIGH (ref 12.3–15.4)
WBC: 10.2 10*3/uL (ref 3.4–10.8)

## 2013-04-10 LAB — HEPATIC FUNCTION PANEL
ALT: 7 IU/L (ref 0–32)
AST: 17 IU/L (ref 0–40)
Albumin: 4.1 g/dL (ref 3.5–4.8)
Alkaline Phosphatase: 72 IU/L (ref 39–117)
Bilirubin, Direct: 0.13 mg/dL (ref 0.00–0.40)
Total Bilirubin: 0.4 mg/dL (ref 0.0–1.2)
Total Protein: 6.5 g/dL (ref 6.0–8.5)

## 2013-04-10 LAB — URINE CULTURE

## 2013-04-10 LAB — LIPASE: Lipase: 18 U/L (ref 0–59)

## 2013-04-20 IMAGING — CR DG CHEST 2V
2 series · 2 of 2 positions shown · non-contrast
Comparison: Chest radiograph 09/26/2011

CLINICAL DATA: Chest pain and fall.

CHEST - 2 VIEW

[x chest ap]
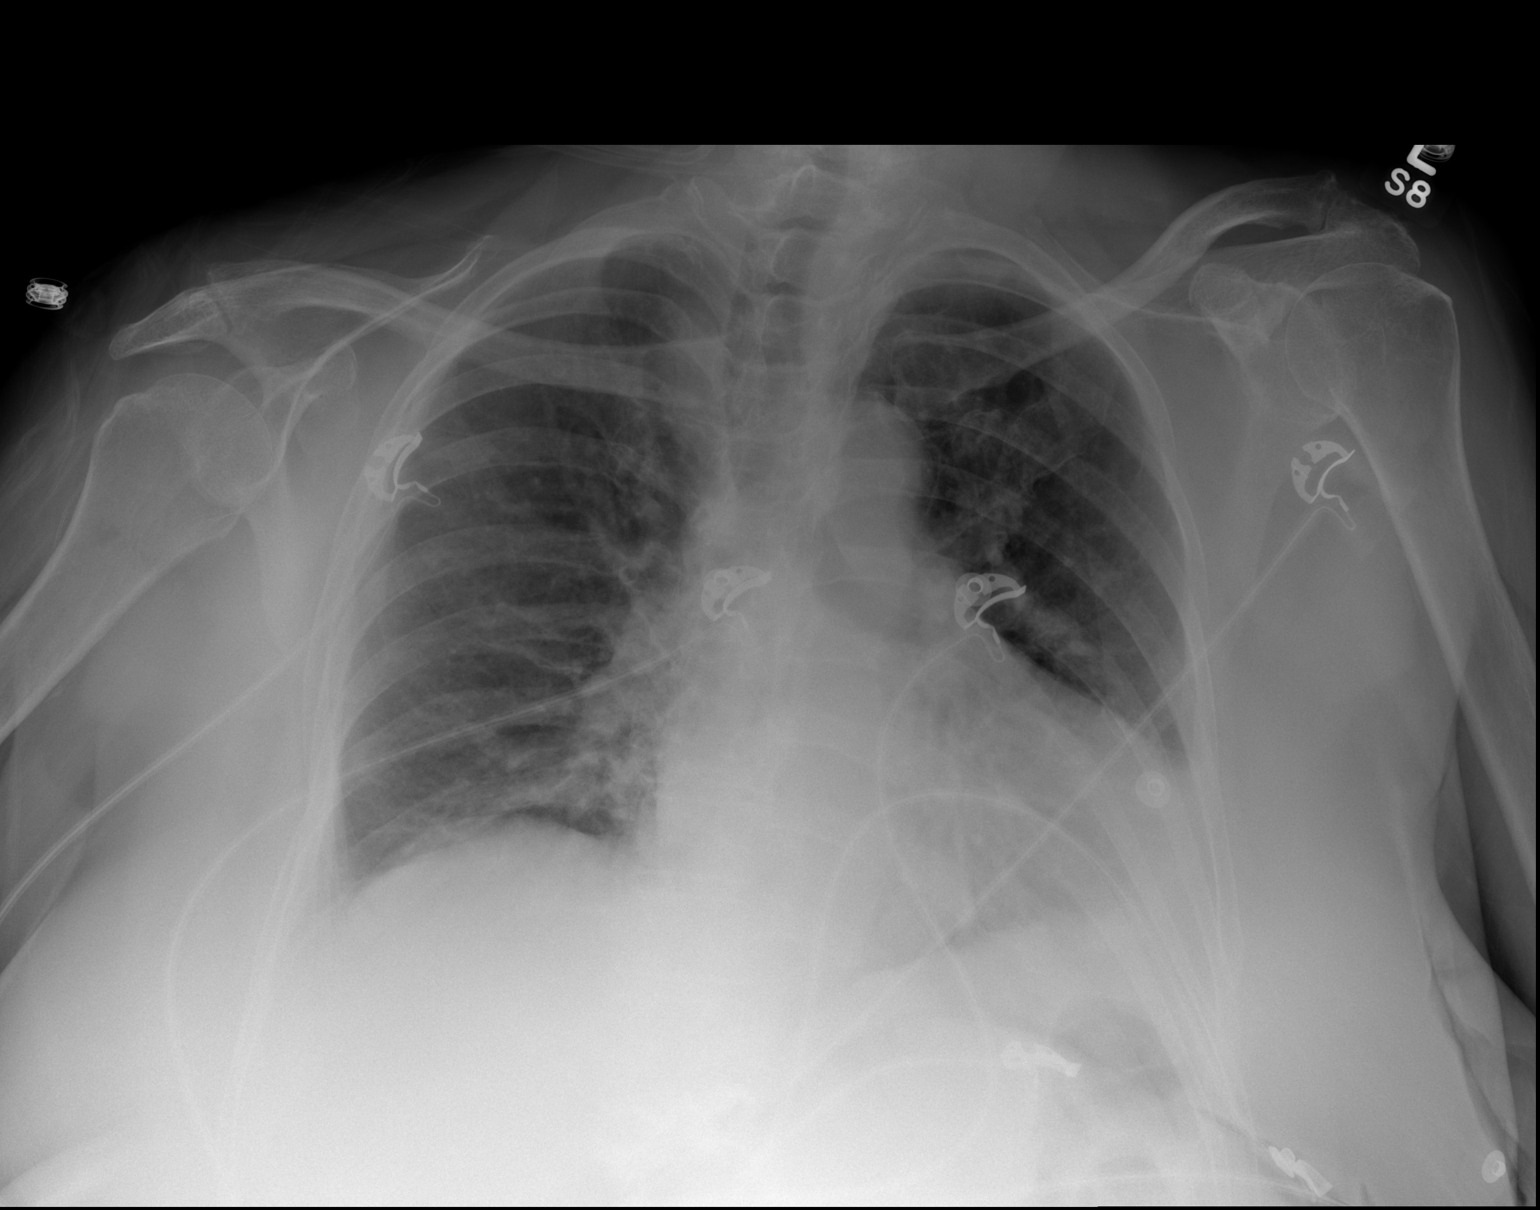

[w chest lat]
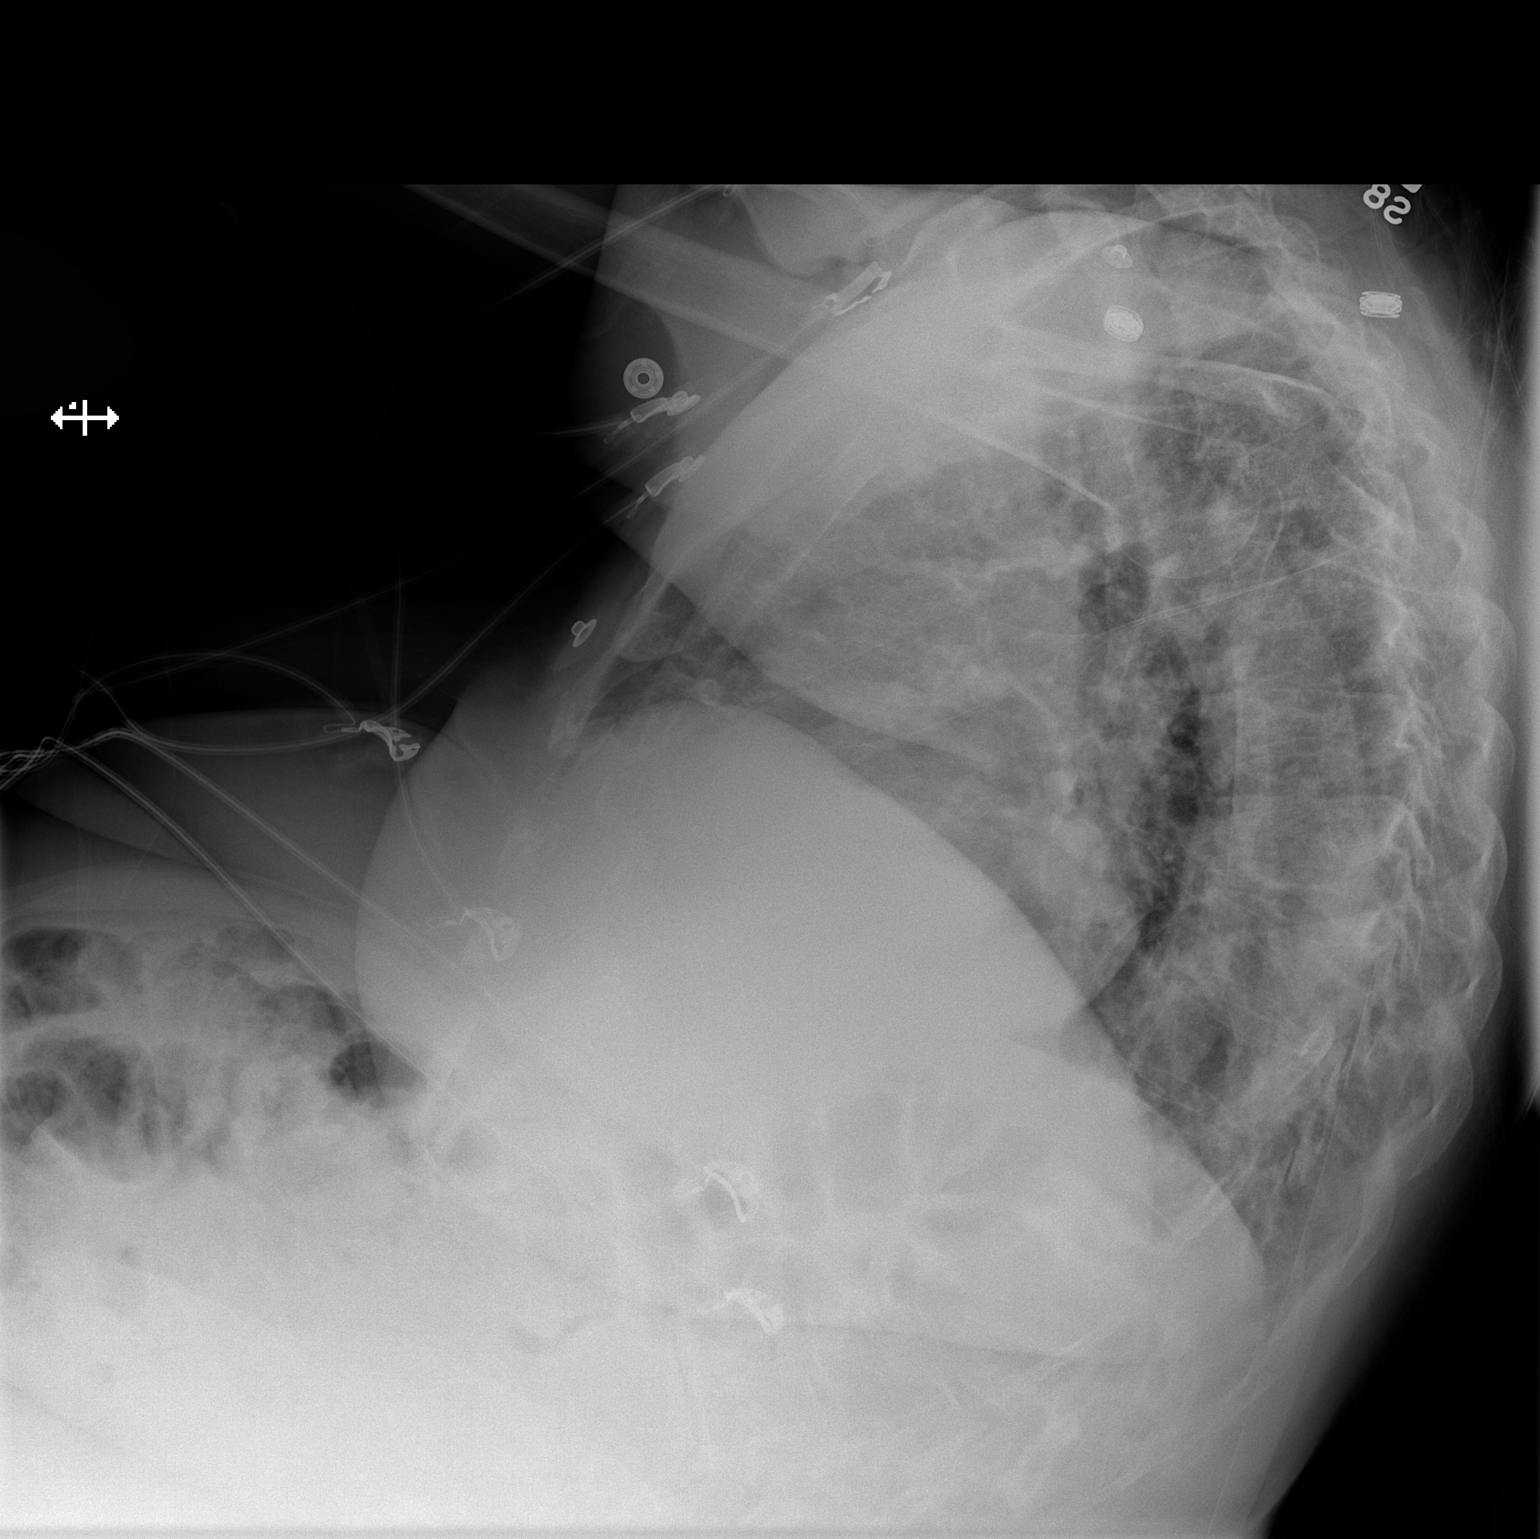

[2 of 2 positions shown; findings below may reference images not displayed]

FINDINGS: The heart, mediastinal, and hilar contours are stable.
Heart size remains prominent.  There is pulmonary vascular
congestion.  Low lung volumes with left basilar atelectasis.  No
visible pleural effusion or pneumothorax.  Surgical clips in the
right neck.

No displaced rib fracture is identified.
IMPRESSION: Stable cardiomegaly with pulmonary vascular congestion.  Low lung
volumes and left basilar atelectasis.

## 2013-05-04 DIAGNOSIS — Z79899 Other long term (current) drug therapy: Secondary | ICD-10-CM | POA: Diagnosis not present

## 2013-05-04 DIAGNOSIS — G894 Chronic pain syndrome: Secondary | ICD-10-CM | POA: Diagnosis not present

## 2013-05-04 DIAGNOSIS — M5137 Other intervertebral disc degeneration, lumbosacral region: Secondary | ICD-10-CM | POA: Diagnosis not present

## 2013-05-21 ENCOUNTER — Other Ambulatory Visit: Payer: Self-pay

## 2013-05-21 MED ORDER — CARBIDOPA-LEVODOPA 25-100 MG PO TABS: 1.5000 | ORAL_TABLET | Freq: Four times a day (QID) | ORAL | Status: DC

## 2013-05-21 MED ORDER — CARBIDOPA-LEVODOPA ER 50-200 MG PO TBCR: 1.0000 | EXTENDED_RELEASE_TABLET | Freq: Every day | ORAL | Status: DC

## 2013-05-25 ENCOUNTER — Encounter: Payer: Self-pay | Admitting: Neurology

## 2013-05-25 ENCOUNTER — Ambulatory Visit (INDEPENDENT_AMBULATORY_CARE_PROVIDER_SITE_OTHER): Payer: Medicare Other | Admitting: Neurology

## 2013-05-25 ENCOUNTER — Telehealth: Payer: Self-pay | Admitting: Neurology

## 2013-05-25 VITALS — BP 143/76 | HR 82 | Ht 61.0 in | Wt 173.0 lb

## 2013-05-25 DIAGNOSIS — G20 Parkinson's disease: Secondary | ICD-10-CM | POA: Diagnosis not present

## 2013-05-25 MED ORDER — CARBIDOPA-LEVODOPA ER 50-200 MG PO TBCR: 25.0000 | EXTENDED_RELEASE_TABLET | Freq: Every day | ORAL | Status: DC

## 2013-05-25 MED ORDER — CARBIDOPA-LEVODOPA-ENTACAPONE 37.5-150-200 MG PO TABS: 1.0000 | ORAL_TABLET | Freq: Four times a day (QID) | ORAL | Status: DC

## 2013-05-25 MED ORDER — PRAMIPEXOLE DIHYDROCHLORIDE 1.5 MG PO TABS: 1.5000 mg | ORAL_TABLET | Freq: Three times a day (TID) | ORAL | Status: DC

## 2013-05-25 NOTE — Progress Notes (Signed)
History of Present Illness:   [name] is a 72 year old female follows up for Parkinson's disease.   She had a history of Parkinson's disease since 1999, also has past medical history of  hypertension, reported history of stroke.  Initial symptoms were  finger stiffness, difficulty manipulating, gait change, decreased arm swing, and also right hemisensory loss. But over the years, she has developed typical parkinsonian features, including resting tremor, bradykinesia, rigidity, and gait difficulty.   She has odd sleeping habit,  goes to bed 6:00 every evening, wakes up around 2:00 in the morning, takes a nap about 10 AM.  She has history of  low back, left buttock area pain, shooting along left posterior thigh and leg, severe in 04/2011, x-ray and later MRI -lumbar has demonstrated T12 compression fracture, , most severe at L2-3, L3-4. She had lumbar brace and later Kyphoplasty by Dr. Debbe Bales in 07/13/2009, overall, back pain is better, but still there, also has left lateral thigh/leg numbness and pain.  She denies incontinence.She sees Dr. Ethelene Hal for pain management.   Bone density scan c/w osteopenia, on vitD and calcium supplement. Recent sleep study by PCP confirms OSA but pt says insurance does not cover CPAP. Reports independent in ADL's, no longer driving. Gets mobile meals during the week.    She was admitted to hospital in May 2013,  suffered GI bleeding and chest pain then, more tremor before the next dose of sinemet. she has increased gait difficulty, pain playing a major role here, she has urinary frequency, urgency, because she is taking Lasix, she lives by herself, 2 of her 5 brothers is actively involved in helping her,  She uses public transportations.     Since last visit was in 08/11/12: She was changed  to Sinemet 25/100 one and 1/2 tabs qid at 6, 10, 14, 18 pm, plus night time sinemet CR 50/200, also taking Mirapex 1.5mg  3 tabs tid, Her low back pain has much improved, she could not  afford Azilect.   She noticed some jerking movement during peak dose of Sinemet.   Review of system: Out of a complete 14 system review, the patient complains of only the following symptoms, and all other reviewed systems are negative.  Neurological: Slurred Speech   Tremor   Gastrointestinal: Constipation   Musculoskeletal: Joint pain     Social History Patient drinks 1-2 cups of coffee/tea daily and denies ever using tobacco, alcohol or illicet drugs.  Inhaled Tobacco Use: never smoker  Family History Mother passed away from kidney failure at 40. Father passed away from CHF at 70.   Past Medical History stroke, PD, gait disorder, sleep apnea-does not use CPAP, long hx of back pain, osteopenia Recent GI bleed, diastolic dysfunction   Surgical History back surgery 07/13/10 thyroid partially removed in 1960's   Physical Exam  General: well developed, well  nourished, seated, in no evident distress Head: head  normocephalic and atraumatic.  Oropharynx benign. Neck: supple with no carotid  bruits. Respiratory: Lungs with few wheezes, short of breath when ambulating Cardiovascular: regular rate and rhythm, no murmurs Skin: Lower extremities dry, erythematous, swollen bilateral lower extremity   Neurologic Exam  Mental Status: Awake and fully alert.  Oriented to place and time. She has left neck tilt, frequent neck jerking movement  Cranial Nerves:   Pupils equal, briskly reactive to light.  Extraocular movements full without nystagmus.  Visual fields full to confrontation.  Hearing intact and symmetric to finger snap.  Facial sensation  intact.  Face, tongue, palate move normally and symmetrically.  Neck flexion and extension normal. Negative Myersons sign.  Motor: intermittent resting tremor of bilateral hand and leg, moderate limb and nuchal rigidity, early fatiguability of rapid wrist opening and closure. Sensory: Intact to touch  Coordination: no dysmetria. Gait and Station: getting up  from seated position pushing on her chair arm, mild hesitation in starting the gait, mild to moderate stride, relies on her Dan Humphreys. [place]block turning  Reflexes: 2+ and symmetric except achilles absent.   Toes downgoing.   Assessment and plan:   72 yo Caucasian female with Idiopathic Parkinson's disease. Osteopenia per bone density. History of T12 compression fracture. Back pain managed by Dr. Ethelene Hal.  1.Parkinson's disease, will switch her to generic stalevo 37.5/150/200 four times a day,  Sinemet CR50/200 mg every night, Mirapex 1.5 mg 3 times a day, 2. Home physical therapy 3.  wound care 4 return to clinic in 6 months with [name]

## 2013-05-26 ENCOUNTER — Telehealth: Payer: Self-pay | Admitting: Neurology

## 2013-05-26 NOTE — Telephone Encounter (Signed)
[name], Can you review please.

## 2013-05-26 NOTE — Telephone Encounter (Signed)
I have called her, Azilect brand is too expensive, she is no longer taking it

## 2013-05-26 NOTE — Telephone Encounter (Signed)
This is a duplicate message.  By viewing the previous phone note, Dr Terrace Arabia has spoken with the patient.

## 2013-05-27 DIAGNOSIS — G20 Parkinson's disease: Secondary | ICD-10-CM | POA: Diagnosis not present

## 2013-05-27 DIAGNOSIS — Z5189 Encounter for other specified aftercare: Secondary | ICD-10-CM | POA: Diagnosis not present

## 2013-05-28 ENCOUNTER — Other Ambulatory Visit: Payer: Self-pay

## 2013-05-28 MED ORDER — PRAMIPEXOLE DIHYDROCHLORIDE 1.5 MG PO TABS: 1.5000 mg | ORAL_TABLET | Freq: Three times a day (TID) | ORAL | Status: DC

## 2013-06-01 DIAGNOSIS — G20 Parkinson's disease: Secondary | ICD-10-CM | POA: Diagnosis not present

## 2013-06-01 DIAGNOSIS — Z5189 Encounter for other specified aftercare: Secondary | ICD-10-CM | POA: Diagnosis not present

## 2013-06-02 DIAGNOSIS — Z5189 Encounter for other specified aftercare: Secondary | ICD-10-CM | POA: Diagnosis not present

## 2013-06-02 DIAGNOSIS — G20 Parkinson's disease: Secondary | ICD-10-CM | POA: Diagnosis not present

## 2013-06-03 DIAGNOSIS — G20 Parkinson's disease: Secondary | ICD-10-CM | POA: Diagnosis not present

## 2013-06-03 DIAGNOSIS — Z5189 Encounter for other specified aftercare: Secondary | ICD-10-CM | POA: Diagnosis not present

## 2013-06-04 DIAGNOSIS — Z5189 Encounter for other specified aftercare: Secondary | ICD-10-CM | POA: Diagnosis not present

## 2013-06-04 DIAGNOSIS — G20 Parkinson's disease: Secondary | ICD-10-CM | POA: Diagnosis not present

## 2013-06-08 DIAGNOSIS — Z5189 Encounter for other specified aftercare: Secondary | ICD-10-CM | POA: Diagnosis not present

## 2013-06-08 DIAGNOSIS — G20 Parkinson's disease: Secondary | ICD-10-CM | POA: Diagnosis not present

## 2013-06-09 DIAGNOSIS — G20 Parkinson's disease: Secondary | ICD-10-CM | POA: Diagnosis not present

## 2013-06-09 DIAGNOSIS — Z5189 Encounter for other specified aftercare: Secondary | ICD-10-CM | POA: Diagnosis not present

## 2013-06-10 DIAGNOSIS — G20 Parkinson's disease: Secondary | ICD-10-CM | POA: Diagnosis not present

## 2013-06-10 DIAGNOSIS — Z5189 Encounter for other specified aftercare: Secondary | ICD-10-CM | POA: Diagnosis not present

## 2013-06-14 DIAGNOSIS — G20 Parkinson's disease: Secondary | ICD-10-CM | POA: Diagnosis not present

## 2013-06-14 DIAGNOSIS — Z5189 Encounter for other specified aftercare: Secondary | ICD-10-CM | POA: Diagnosis not present

## 2013-06-15 ENCOUNTER — Encounter (HOSPITAL_BASED_OUTPATIENT_CLINIC_OR_DEPARTMENT_OTHER): Payer: Medicare Other | Attending: General Surgery

## 2013-06-15 DIAGNOSIS — G20 Parkinson's disease: Secondary | ICD-10-CM | POA: Insufficient documentation

## 2013-06-15 DIAGNOSIS — Z79899 Other long term (current) drug therapy: Secondary | ICD-10-CM | POA: Insufficient documentation

## 2013-06-15 DIAGNOSIS — I10 Essential (primary) hypertension: Secondary | ICD-10-CM | POA: Diagnosis not present

## 2013-06-15 DIAGNOSIS — I87309 Chronic venous hypertension (idiopathic) without complications of unspecified lower extremity: Secondary | ICD-10-CM | POA: Insufficient documentation

## 2013-06-15 DIAGNOSIS — D2370 Other benign neoplasm of skin of unspecified lower limb, including hip: Secondary | ICD-10-CM | POA: Diagnosis not present

## 2013-06-15 DIAGNOSIS — M129 Arthropathy, unspecified: Secondary | ICD-10-CM | POA: Insufficient documentation

## 2013-06-15 DIAGNOSIS — G20A1 Parkinson's disease without dyskinesia, without mention of fluctuations: Secondary | ICD-10-CM | POA: Insufficient documentation

## 2013-06-15 NOTE — H&P (Signed)
[name], [name] NO.:  [id number]  MEDICAL RECORD NO.:  [id number]  LOCATION:  FOOT                         FACILITY:  [facility]  PHYSICIAN:  Joanne Gavel, M.D.        DATE OF BIRTH:  July 15, 1941  DATE OF ADMISSION:  06/15/2013 DATE OF DISCHARGE:                             HISTORY & PHYSICAL   CHIEF COMPLAINT:  Swelling of both legs.  HISTORY OF PRESENT ILLNESS:  This is a 71 year old female with very symptomatic Parkinson disease.  She has a history of osteopenia and compression fractures.  She has swelling and redness of the legs but has never had any drainage or ulcer that she could see.  PAST MEDICAL HISTORY:  Significant for Parkinson disease, hypertension, arthritis, anemia, chronic systolic heart failure, and history of gastric ulcer with hemorrhage.  PAST SURGICAL HISTORY:  Includes arthroplasty of hip, thyroidectomy, and upper endoscopy.  SOCIAL HISTORY:  Cigarettes none.  Alcohol none.  MEDICATIONS:  Albuterol, Sinemet, Cipro, ferrous sulfate, magnesium oxide, potassium chloride, Mirapex, furosemide, and Florastor.  ALLERGIES:  Penicillin and codeine.  PHYSICAL EXAMINATION:  VITAL SIGNS:  Temperature 97.9, pulse 72, respirations 16, blood pressure 152/77. GENERAL APPEARANCE:  Well developed.  She has obvious tremor. CHEST:  Clear. HEART:  Regular rhythm with no murmurs. EXTREMITIES:  Examination of extremities reveals redness and some swelling of both legs.  The right leg is somewhat larger than the left. ABI is 1.17 on the right and 1.13 on the left.  On the left leg there are 2 nevi which are somewhat asymmetrical.  There are no ulcerations or signs of drainage.  IMPRESSION:  Chronic venous hypertension without ulcerations at present. She has 2 nevi, 1 of which is somewhat suspicious on the left leg.  PLAN OF TREATMENT:  We have ordered support stockings for her and we will see her p.r.n.     Joanne Gavel, M.D.     RA/MEDQ  D:   06/15/2013  T:  06/15/2013  Job:  [id number]

## 2013-06-16 DIAGNOSIS — Z5189 Encounter for other specified aftercare: Secondary | ICD-10-CM | POA: Diagnosis not present

## 2013-06-16 DIAGNOSIS — G20 Parkinson's disease: Secondary | ICD-10-CM | POA: Diagnosis not present

## 2013-06-17 ENCOUNTER — Other Ambulatory Visit: Payer: Self-pay

## 2013-06-17 DIAGNOSIS — G20 Parkinson's disease: Secondary | ICD-10-CM | POA: Diagnosis not present

## 2013-06-17 DIAGNOSIS — Z5189 Encounter for other specified aftercare: Secondary | ICD-10-CM | POA: Diagnosis not present

## 2013-06-17 MED ORDER — CARBIDOPA-LEVODOPA ER 50-200 MG PO TBCR: 1.0000 | EXTENDED_RELEASE_TABLET | Freq: Every day | ORAL | Status: DC

## 2013-06-22 DIAGNOSIS — G20 Parkinson's disease: Secondary | ICD-10-CM | POA: Diagnosis not present

## 2013-06-22 DIAGNOSIS — Z5189 Encounter for other specified aftercare: Secondary | ICD-10-CM | POA: Diagnosis not present

## 2013-06-24 DIAGNOSIS — G20 Parkinson's disease: Secondary | ICD-10-CM | POA: Diagnosis not present

## 2013-06-24 DIAGNOSIS — Z5189 Encounter for other specified aftercare: Secondary | ICD-10-CM | POA: Diagnosis not present

## 2013-07-08 ENCOUNTER — Telehealth: Payer: Self-pay | Admitting: Neurology

## 2013-07-08 NOTE — Telephone Encounter (Signed)
OT with [facility] left message in that pt was evaluated and he will meet with her 2 times a week for 2 weeks and then 1 time a week for 2 weeks.  Once goals are met he will d/c the patient.  Late entry from September.

## 2013-07-15 DIAGNOSIS — I8310 Varicose veins of unspecified lower extremity with inflammation: Secondary | ICD-10-CM | POA: Diagnosis not present

## 2013-07-15 DIAGNOSIS — L821 Other seborrheic keratosis: Secondary | ICD-10-CM | POA: Diagnosis not present

## 2013-07-15 DIAGNOSIS — Z85828 Personal history of other malignant neoplasm of skin: Secondary | ICD-10-CM | POA: Diagnosis not present

## 2013-07-23 ENCOUNTER — Telehealth: Payer: Self-pay | Admitting: Neurology

## 2013-07-28 ENCOUNTER — Other Ambulatory Visit: Payer: Self-pay | Admitting: Neurology

## 2013-07-28 MED ORDER — PRAMIPEXOLE DIHYDROCHLORIDE 1.5 MG PO TABS: 1.5000 mg | ORAL_TABLET | Freq: Three times a day (TID) | ORAL | Status: DC

## 2013-07-30 ENCOUNTER — Emergency Department (HOSPITAL_COMMUNITY): Payer: Medicare Other

## 2013-07-30 ENCOUNTER — Emergency Department (HOSPITAL_COMMUNITY)
Admission: EM | Admit: 2013-07-30 | Discharge: 2013-07-30 | Disposition: A | Discharge status: Home / Self Care | Payer: Medicare Other | Attending: Emergency Medicine | Admitting: Emergency Medicine

## 2013-07-30 ENCOUNTER — Encounter (HOSPITAL_COMMUNITY): Payer: Self-pay | Admitting: Emergency Medicine

## 2013-07-30 DIAGNOSIS — G20 Parkinson's disease: Secondary | ICD-10-CM | POA: Insufficient documentation

## 2013-07-30 DIAGNOSIS — Z792 Long term (current) use of antibiotics: Secondary | ICD-10-CM | POA: Insufficient documentation

## 2013-07-30 DIAGNOSIS — M545 Low back pain, unspecified: Secondary | ICD-10-CM | POA: Diagnosis not present

## 2013-07-30 DIAGNOSIS — K5900 Constipation, unspecified: Secondary | ICD-10-CM | POA: Insufficient documentation

## 2013-07-30 DIAGNOSIS — S79919A Unspecified injury of unspecified hip, initial encounter: Secondary | ICD-10-CM | POA: Diagnosis not present

## 2013-07-30 DIAGNOSIS — I5023 Acute on chronic systolic (congestive) heart failure: Secondary | ICD-10-CM | POA: Diagnosis not present

## 2013-07-30 DIAGNOSIS — Z79899 Other long term (current) drug therapy: Secondary | ICD-10-CM | POA: Diagnosis not present

## 2013-07-30 DIAGNOSIS — Z8619 Personal history of other infectious and parasitic diseases: Secondary | ICD-10-CM | POA: Diagnosis not present

## 2013-07-30 DIAGNOSIS — Z880 Allergy status to penicillin: Secondary | ICD-10-CM | POA: Insufficient documentation

## 2013-07-30 DIAGNOSIS — D649 Anemia, unspecified: Secondary | ICD-10-CM | POA: Diagnosis not present

## 2013-07-30 DIAGNOSIS — K250 Acute gastric ulcer with hemorrhage: Secondary | ICD-10-CM | POA: Insufficient documentation

## 2013-07-30 DIAGNOSIS — E041 Nontoxic single thyroid nodule: Secondary | ICD-10-CM | POA: Diagnosis not present

## 2013-07-30 DIAGNOSIS — M25552 Pain in left hip: Secondary | ICD-10-CM

## 2013-07-30 DIAGNOSIS — R296 Repeated falls: Secondary | ICD-10-CM | POA: Insufficient documentation

## 2013-07-30 DIAGNOSIS — Z872 Personal history of diseases of the skin and subcutaneous tissue: Secondary | ICD-10-CM | POA: Insufficient documentation

## 2013-07-30 DIAGNOSIS — I10 Essential (primary) hypertension: Secondary | ICD-10-CM | POA: Diagnosis not present

## 2013-07-30 DIAGNOSIS — S298XXA Other specified injuries of thorax, initial encounter: Secondary | ICD-10-CM | POA: Diagnosis not present

## 2013-07-30 DIAGNOSIS — M129 Arthropathy, unspecified: Secondary | ICD-10-CM | POA: Insufficient documentation

## 2013-07-30 DIAGNOSIS — S79929A Unspecified injury of unspecified thigh, initial encounter: Secondary | ICD-10-CM | POA: Diagnosis not present

## 2013-07-30 DIAGNOSIS — Z9889 Other specified postprocedural states: Secondary | ICD-10-CM | POA: Diagnosis not present

## 2013-07-30 DIAGNOSIS — Y9289 Other specified places as the place of occurrence of the external cause: Secondary | ICD-10-CM | POA: Insufficient documentation

## 2013-07-30 DIAGNOSIS — R0989 Other specified symptoms and signs involving the circulatory and respiratory systems: Secondary | ICD-10-CM | POA: Insufficient documentation

## 2013-07-30 DIAGNOSIS — IMO0002 Reserved for concepts with insufficient information to code with codable children: Secondary | ICD-10-CM | POA: Insufficient documentation

## 2013-07-30 DIAGNOSIS — R059 Cough, unspecified: Secondary | ICD-10-CM | POA: Diagnosis not present

## 2013-07-30 DIAGNOSIS — G20A1 Parkinson's disease without dyskinesia, without mention of fluctuations: Secondary | ICD-10-CM | POA: Insufficient documentation

## 2013-07-30 DIAGNOSIS — I959 Hypotension, unspecified: Secondary | ICD-10-CM | POA: Diagnosis not present

## 2013-07-30 DIAGNOSIS — M25559 Pain in unspecified hip: Secondary | ICD-10-CM | POA: Diagnosis not present

## 2013-07-30 DIAGNOSIS — S0993XA Unspecified injury of face, initial encounter: Secondary | ICD-10-CM | POA: Diagnosis not present

## 2013-07-30 DIAGNOSIS — T148XXA Other injury of unspecified body region, initial encounter: Secondary | ICD-10-CM | POA: Diagnosis not present

## 2013-07-30 DIAGNOSIS — S0990XA Unspecified injury of head, initial encounter: Secondary | ICD-10-CM | POA: Diagnosis not present

## 2013-07-30 DIAGNOSIS — R51 Headache: Secondary | ICD-10-CM | POA: Diagnosis not present

## 2013-07-30 DIAGNOSIS — W19XXXA Unspecified fall, initial encounter: Secondary | ICD-10-CM

## 2013-07-30 DIAGNOSIS — Y9301 Activity, walking, marching and hiking: Secondary | ICD-10-CM | POA: Insufficient documentation

## 2013-07-30 DIAGNOSIS — M542 Cervicalgia: Secondary | ICD-10-CM | POA: Diagnosis not present

## 2013-07-30 DIAGNOSIS — R05 Cough: Secondary | ICD-10-CM | POA: Insufficient documentation

## 2013-07-30 LAB — CBC WITH DIFFERENTIAL/PLATELET
Basophils Absolute: 0 10*3/uL (ref 0.0–0.1)
Basophils Relative: 1 % (ref 0–1)
Eosinophils Relative: 2 % (ref 0–5)
HCT: 34.7 % — ABNORMAL LOW (ref 36.0–46.0)
Hemoglobin: 11.1 g/dL — ABNORMAL LOW (ref 12.0–15.0)
Lymphocytes Relative: 11 % — ABNORMAL LOW (ref 12–46)
MCH: 24.3 pg — ABNORMAL LOW (ref 26.0–34.0)
MCHC: 32 g/dL (ref 30.0–36.0)
Monocytes Absolute: 0.6 10*3/uL (ref 0.1–1.0)
Monocytes Relative: 7 % (ref 3–12)
Neutro Abs: 6.9 10*3/uL (ref 1.7–7.7)
Neutrophils Relative %: 80 % — ABNORMAL HIGH (ref 43–77)
RBC: 4.57 MIL/uL (ref 3.87–5.11)
WBC: 8.7 10*3/uL (ref 4.0–10.5)

## 2013-07-30 LAB — COMPREHENSIVE METABOLIC PANEL
AST: 21 U/L (ref 0–37)
Albumin: 3.1 g/dL — ABNORMAL LOW (ref 3.5–5.2)
Alkaline Phosphatase: 82 U/L (ref 39–117)
BUN: 13 mg/dL (ref 6–23)
CO2: 25 mEq/L (ref 19–32)
Calcium: 8.9 mg/dL (ref 8.4–10.5)
Chloride: 104 mEq/L (ref 96–112)
Creatinine, Ser: 0.63 mg/dL (ref 0.50–1.10)
GFR calc Af Amer: >90 mL/min (ref >90)
GFR calc non Af Amer: 87 mL/min — ABNORMAL LOW (ref >90)
Glucose, Bld: 97 mg/dL (ref 70–99)
Potassium: 4.6 mEq/L (ref 3.5–5.1)
Total Bilirubin: 0.5 mg/dL (ref 0.3–1.2)
Total Protein: 6.3 g/dL (ref 6.0–8.3)

## 2013-07-30 LAB — URINALYSIS, ROUTINE W REFLEX MICROSCOPIC
Bilirubin Urine: NEGATIVE
Hgb urine dipstick: NEGATIVE
Ketones, ur: 15 mg/dL — AB
Nitrite: NEGATIVE
Protein, ur: NEGATIVE mg/dL
Specific Gravity, Urine: 1.02 (ref 1.005–1.030)
Urobilinogen, UA: 1 mg/dL (ref 0.0–1.0)

## 2013-07-30 LAB — POCT I-STAT TROPONIN I

## 2013-07-30 NOTE — Progress Notes (Addendum)
ED CM consulted by EDP covering [address] regarding HH. Into speak with patient. Pt lives alone and has brother coming in daily to check on her. Discussed the recommendation for Home Health, patient in agreement with disposition plan.  Discussed HH services, and the different agencies. Choice given, Pt states, she was familiar with [facility] used services in the past. She reports having a walker and 3:1 chair at home.  HH order placed for PT, RN, OT, HHA and SW. Call referral in, spoke with [facility].Verified current address and phone number with patient. Discussed discharge plan with Drinda Butts RN and R. Albert PA-C on [address]. Informed patient of referral placed with [facility],. Explained someone from [facility] should be contacting her at the number provided within 24 - 48hr to set up a visit time. [facility] contact phone number given. Pt verbalized understanding using teach back method. No further CM needs identified

## 2013-07-30 NOTE — ED Notes (Signed)
Per EMS: Pt fell when up to bathroom at 0000. Pt was on floor til brother arrived at 38. No LOC. Pt reports she did not hit her head, landed on her left hip. C/o constant pain on left side. Per EMS, can bear wt but painful to do so. No deformities. EMS VS: BP 142/78, HR 76.

## 2013-07-30 NOTE — ED Notes (Signed)
Care Management called and will see pt for evaluation of home health assistance.

## 2013-07-30 NOTE — ED Notes (Signed)
Pt was able to bear weigh and ambulate around room. Pt reports being dizzy however and had to sit down.

## 2013-07-30 NOTE — ED Notes (Addendum)
Patient transported to CT and XR 

## 2013-07-30 NOTE — ED Provider Notes (Signed)
CSN: [id number]     Arrival date & time 07/30/13  4098 History   First MD Initiated Contact with Patient 07/30/13 0740     Chief Complaint  Patient presents with  . Fall   (Consider location/radiation/quality/duration/timing/severity/associated sxs/prior Treatment) Patient is a 72 y.o. female presenting with fall. The history is provided by the patient and the EMS personnel.  Fall Associated symptoms include coughing. Pertinent negatives include no abdominal pain, chest pain, chills, fever, nausea, numbness, sore throat, vomiting or weakness.   Pt with hx parkinsons, walks with a walker, presents after a fall overnight.  States she was walking back from the bathroom when she fell for unknown reason.  Denies hitting head or LOC.  States she was on the floor for about 30 minutes before her brother came to help her up.  Has had persistent pain in her low back and left hip, 3/10 intensity, denies weakness or numbness of the legs. Has taken tylenol for pain.  Denies fevers, chills, CP, SOB, recent illness, abdominal pain, N/V/D, urinary symptoms.  Does have chronic cough with clear sputum that has worsened over the past few months but is unchanged recently. Has had swelling and redness of lower legs chronically, unchanged.    Past Medical History  Diagnosis Date  . Parkinson's disease   . Hypertension   . Arthritis   . Anemia   . Cellulitis of lower leg 12/24/2011  . Acute bronchitis   . Other abnormal blood chemistry   . Chronic systolic heart failure   . Acute on chronic systolic heart failure   . Cellulitis and abscess of leg, except foot   . Hypotension, unspecified   . Helicobacter pylori (H. pylori)   . Acute gastric ulcer with hemorrhage, without mention of obstruction    Past Surgical History  Procedure Laterality Date  . Hip arthroscopy w/ labral repair    . Thyroidectomy    . Esophagogastroduodenoscopy  09/26/2011    Procedure: ESOPHAGOGASTRODUODENOSCOPY (EGD);  Surgeon: Erick Blinks, MD;  Location: [facility] ENDOSCOPY;  Service: Gastroenterology;  Laterality: N/A;  To be done at bedside.   Family History  Problem Relation Age of Onset  . GER disease Mother   . Heart disease Father    History  Substance Use Topics  . Smoking status: Never Smoker   . Smokeless tobacco: Never Used  . Alcohol Use: No   OB History   Grav Para Term Preterm Abortions TAB SAB Ect Mult Living                 Review of Systems  Constitutional: Negative for fever and chills.  HENT: Negative for sore throat.   Respiratory: Positive for cough. Negative for shortness of breath.   Cardiovascular: Negative for chest pain.  Gastrointestinal: Positive for constipation (last BM yesterday). Negative for nausea, vomiting, abdominal pain, diarrhea and blood in stool.  Genitourinary: Negative for dysuria, urgency and frequency.  Musculoskeletal: Positive for back pain.  Neurological: Negative for dizziness, syncope, weakness, light-headedness and numbness.    Allergies  Codeine and Penicillins  Home Medications   Current Outpatient Rx  Name  Route  Sig  Dispense  Refill  . albuterol (PROVENTIL) (2.5 MG/3ML) 0.083% nebulizer solution   Nebulization   Take 2.5 mg by nebulization every 6 (six) hours as needed. For shortness of breath         . b complex vitamins tablet   Oral   Take 1 tablet by mouth daily.           Marland Kitchen  carbidopa-levodopa (SINEMET CR) 50-200 MG per tablet   Oral   Take 1 tablet by mouth at bedtime. 10pm   30 tablet   12   . carbidopa-levodopa (SINEMET IR) 25-100 MG per tablet   Oral   Take 1.5 tablets by mouth 4 (four) times daily. 6am, 10am, 2pm and 6pm   540 tablet   1   . carbidopa-levodopa-entacapone (STALEVO) 37.5-150-200 MG per tablet   Oral   Take 1 tablet by mouth 4 (four) times daily.   130 tablet   12   . ciprofloxacin (CIPRO) 500 MG tablet   Oral   Take 1 tablet (500 mg total) by mouth 2 (two) times daily.   20 tablet   0   . ferrous  sulfate 325 (65 FE) MG tablet   Oral   Take 325 mg by mouth daily.         Marland Kitchen EXPIRED: furosemide (LASIX) 20 MG tablet   Oral   Take 40 mg by mouth 2 (two) times daily.         . magnesium oxide (MAG-OX) 400 MG tablet   Oral   Take 400 mg by mouth daily.          . potassium chloride SA (K-DUR,KLOR-CON) 20 MEQ tablet   Oral   Take 20 mEq by mouth every other day.          . pramipexole (MIRAPEX) 1.5 MG tablet   Oral   Take 1 tablet (1.5 mg total) by mouth 3 (three) times daily.   270 tablet   3   . saccharomyces boulardii (FLORASTOR) 250 MG capsule   Oral   Take 250 mg by mouth 2 (two) times daily.         Marland Kitchen SALINE NASAL SPRAY NA   Nasal   Place 1 spray into the nose 3 (three) times daily as needed. For congestion         . sennosides-docusate sodium (SENOKOT-S) 8.6-50 MG tablet   Oral   Take 2 tablets by mouth daily as needed. For constipation          BP 134/66  Pulse 82  Temp(Src) 97.8 F (36.6 C) (Oral)  Resp 16  SpO2 97% Physical Exam  Nursing note and vitals reviewed. Constitutional: She appears well-developed and well-nourished. No distress.  HENT:  Head: Normocephalic and atraumatic.  Neck: Neck supple.  Cardiovascular: Normal rate and regular rhythm.   Pulmonary/Chest: Effort normal and breath sounds normal. No respiratory distress. She has no wheezes. She has no rales.  Abdominal: Soft. She exhibits no distension. There is no tenderness. There is no rebound and no guarding.  Musculoskeletal:       Right hip: She exhibits tenderness. She exhibits normal range of motion.       Left hip: She exhibits decreased range of motion and tenderness.       Cervical back: She exhibits no bony tenderness.       Thoracic back: She exhibits no bony tenderness.       Lumbar back: She exhibits bony tenderness.  Diffuse tenderness over lumbar spine without crepitus or step-offs.  T and C spine without tenderness.  Right hip mildly tender to palpation, no  pain with passive ROM.  Left hip tender to palpation and pain with passive ROM.  Distal pulses and sensation are intact.    Neurological: She is alert.  Skin: She is not diaphoretic.    ED Course  Procedures (including critical care time)  Labs Review Labs Reviewed  URINALYSIS, ROUTINE W REFLEX MICROSCOPIC - Abnormal; Notable for the following:    Ketones, ur 15 (*)    All other components within normal limits  CBC WITH DIFFERENTIAL - Abnormal; Notable for the following:    Hemoglobin 11.1 (*)    HCT 34.7 (*)    MCV 75.9 (*)    MCH 24.3 (*)    RDW 19.1 (*)    Platelets 144 (*)    Neutrophils Relative % 80 (*)    Lymphocytes Relative 11 (*)    All other components within normal limits  COMPREHENSIVE METABOLIC PANEL - Abnormal; Notable for the following:    Albumin 3.1 (*)    GFR calc non Af Amer 87 (*)    All other components within normal limits  URINE CULTURE  POCT I-STAT TROPONIN I   Imaging Review Dg Chest 1 View  07/30/2013   CLINICAL DATA:  Fall with low back pain.  EXAM: CHEST - 1 VIEW  COMPARISON:  11/07/2012  FINDINGS: The cardio pericardial silhouette is enlarged. Vascular congestion noted with suggestion of interstitial pulmonary edema. No focal airspace consolidation or substantial pleural effusion. Chronic atelectasis or scarring seen at the left base.  IMPRESSION: Cardiomegaly with vascular congestion and likely underlying component of interstitial edema.   Electronically Signed   By: Kennith Center M.D.   On: 07/30/2013 09:15   Dg Lumbar Spine Complete  07/30/2013   CLINICAL DATA:  Fall, back pain, left hip pain.  EXAM: LUMBAR SPINE - COMPLETE 4+ VIEW  COMPARISON:  None.  FINDINGS: There are 5 nonrib bearing lumbar-type vertebral bodies. There is generalized osteopenia. There is methylmethacrylate within the T12 vertebral body with a mild compression deformity. There is anterior vertebral body compression fracture of the T11 vertebral body with approximately 20% height  loss which is new compared with 07/12/2009. The remainder of the vertebral body heights are maintained. There is a slight kyphosis centered at T11-12. The alignment is anatomic. There is no spondylolysis. There is no static listhesis. There is degenerative disc disease throughout the lumbar spine most severe at L2-3 and L3-4 throughout the lumbar spine. There is a levo curvature of the lumbar spine.  The SI joints are unremarkable.  IMPRESSION: Age-indeterminate T11 vertebral body compression fracture with approximately 20% height loss which is new compared with 07/12/2009. If there is clinical concern regarding the chronicity of the fracture recommend a bone scan or MRI.   Electronically Signed   By: Elige Ko   On: 07/30/2013 09:12   Dg Hip Complete Left  07/30/2013   CLINICAL DATA:  Fall. Left hip pain.  EXAM: LEFT HIP - COMPLETE 2+ VIEW  COMPARISON:  11/1911  FINDINGS: There is no evidence of hip fracture or dislocation. There is no evidence of arthropathy or other focal bone abnormality.  AP view the pelvis shows no fractures. A right hip prosthesis is well-seated and aligned.  The bones are demineralized.  IMPRESSION: No fracture or dislocation or acute finding.   Electronically Signed   By: Amie Portland M.D.   On: 07/30/2013 09:14   Ct Head Wo Contrast  07/30/2013   CLINICAL DATA:  Fall. Head and neck pain.  EXAM: CT HEAD WITHOUT CONTRAST  CT CERVICAL SPINE WITHOUT CONTRAST  TECHNIQUE: Multidetector CT imaging of the head and cervical spine was performed following the standard protocol without intravenous contrast. Multiplanar CT image reconstructions of the cervical spine were also generated.  COMPARISON:  None.  FINDINGS: CT  HEAD FINDINGS  Ventricles are normal in size and configuration. There are no parenchymal masses or mass effect. There are minor areas of white matter hypoattenuation most consistent with chronic microvascular ischemic change. No evidence of a cortical infarction.  There are no  extra-axial masses or abnormal fluid collections.  There is no intracranial hemorrhage.  No skull fracture. Minor right maxillary sinus and sphenoid sinus mucosal thickening. Remaining sinuses and mastoid air cells are clear.  CT CERVICAL SPINE FINDINGS  No fracture. No spondylolisthesis.  Moderate loss of disk height is noted at C5-C6. Remaining disk spaces are well preserved. There is cystic change, joint space widening and mild subchondral sclerosis and small marginal osteophytes at the facet joint at C4-C5 on the left. Milder facet degenerative changes noted at C2-C3 and C3-C4.  Uncovertebral spurring leads to moderate neural foraminal narrowing on the right at C 4-C5 and C5-C6 and mild to moderate neural foraminal narrowing on the left at C4-C5 and C5-C6.  Been a right thyroidectomy. The left thyroid lobe is enlarged and heterogeneous consistent with numerous nodules. The soft tissues are otherwise unremarkable.  Mild interstitial thickening is noted at the lung apices and there is mild patchy hazy opacity, which is nonspecific. This may be chronic or be due to small airways disease. Mild edema should be considered in the proper clinical setting.  IMPRESSION: Head CT: No acute intracranial abnormalities. Minor small vessel ischemic change. Minor sinus mucosal thickening.  Cervical CT:  1. No fracture or acute finding. 2. Degenerative changes as detailed. 3. That post right thyroidectomy. Left thyroid lobe is enlarged with multiple nodules. 4. Interstitial thickening and mild patchy areas of hazy lung opacity at the lung apices. Consider mild pulmonary edema if there shortness of breath.   Electronically Signed   By: Amie Portland M.D.   On: 07/30/2013 08:45   Ct Cervical Spine Wo Contrast  07/30/2013   CLINICAL DATA:  Fall. Head and neck pain.  EXAM: CT HEAD WITHOUT CONTRAST  CT CERVICAL SPINE WITHOUT CONTRAST  TECHNIQUE: Multidetector CT imaging of the head and cervical spine was performed following the  standard protocol without intravenous contrast. Multiplanar CT image reconstructions of the cervical spine were also generated.  COMPARISON:  None.  FINDINGS: CT HEAD FINDINGS  Ventricles are normal in size and configuration. There are no parenchymal masses or mass effect. There are minor areas of white matter hypoattenuation most consistent with chronic microvascular ischemic change. No evidence of a cortical infarction.  There are no extra-axial masses or abnormal fluid collections.  There is no intracranial hemorrhage.  No skull fracture. Minor right maxillary sinus and sphenoid sinus mucosal thickening. Remaining sinuses and mastoid air cells are clear.  CT CERVICAL SPINE FINDINGS  No fracture. No spondylolisthesis.  Moderate loss of disk height is noted at C5-C6. Remaining disk spaces are well preserved. There is cystic change, joint space widening and mild subchondral sclerosis and small marginal osteophytes at the facet joint at C4-C5 on the left. Milder facet degenerative changes noted at C2-C3 and C3-C4.  Uncovertebral spurring leads to moderate neural foraminal narrowing on the right at C 4-C5 and C5-C6 and mild to moderate neural foraminal narrowing on the left at C4-C5 and C5-C6.  Been a right thyroidectomy. The left thyroid lobe is enlarged and heterogeneous consistent with numerous nodules. The soft tissues are otherwise unremarkable.  Mild interstitial thickening is noted at the lung apices and there is mild patchy hazy opacity, which is nonspecific. This may be chronic or  be due to small airways disease. Mild edema should be considered in the proper clinical setting.  IMPRESSION: Head CT: No acute intracranial abnormalities. Minor small vessel ischemic change. Minor sinus mucosal thickening.  Cervical CT:  1. No fracture or acute finding. 2. Degenerative changes as detailed. 3. That post right thyroidectomy. Left thyroid lobe is enlarged with multiple nodules. 4. Interstitial thickening and mild  patchy areas of hazy lung opacity at the lung apices. Consider mild pulmonary edema if there shortness of breath.   Electronically Signed   By: Amie Portland M.D.   On: 07/30/2013 08:45    EKG Interpretation     Ventricular Rate:  73 PR Interval:  123 QRS Duration: 88 QT Interval:  409 QTC Calculation: 451 R Axis:   39 Text Interpretation:  Sinus rhythm. Normal EKG, uncahnged from prior          8:17 AM Pt seen and examined.  Imaging, EKG, labs, urine tests ordered.  Pt declines pain medication at this time.  Made NPO as c/o hip pain, r/o fracture.   12:13 PM Pt ambulated without pain.  Denies back pain.  She also denies any chest pain or SOB. Pt is comfortable with d/c home.  She is concerned about falling again because she lives alone, she is not concerned for any particular reason, just scared to fall again.  She does have a life alert button.  Nurse will call social work to see what home health might offer the patient.  Pt already has meals delivered to her home.   Pt also seen by Dr Micheline Maze.    MDM   1. Fall, initial encounter   2. Left hip pain   3. Thyroid nodule   4. Pulmonary vascular congestion      Elderly pt with multiple medical problems including Parkinson's disease p/w left hip pain after fall.  Unclear reason for fall.  Denies syncope. Pt with hx CHF, with cardiomegaly and vascular congestion on CXR- pt denies any CP or SOB.  Pt also with compression fracture of the lumbar spine - pt denies any back pain currently.  Left hip xray negative, pt was able to ambulate without pain (with assistance, pt uses a walker at home) - do not think we need to pursue more imaging at this time. EKG unchanged.  Pt has erythema and swelling of bilateral lower extremities that patient states is chronic and unchanged, this does have appearance of chronic changes.  Pt d/c home, which she agrees with and states she is comfortable with. Case manager to set up home health for patient.  Discussed result, findings, treatment, and follow up  with patient.  Pt given return precautions.  Pt verbalizes understanding and agrees with plan.       Trixie Dredge, PA-C 07/30/13 1442

## 2013-07-30 NOTE — ED Notes (Signed)
Pt informed that case management will come to see her. Pt is also waiting for her brother to arrive to pick her up. Discharge paper are at bedside with pt. As well as belongings.

## 2013-07-31 LAB — URINE CULTURE
Colony Count: NO GROWTH
Special Requests: NORMAL

## 2013-07-31 NOTE — ED Provider Notes (Signed)
Medical screening examination/treatment/procedure(s) were conducted as a shared visit with non-physician practitioner(s) and myself.  I personally evaluated the patient during the encounter.  Pt presents from home after fall from unknown cause.  Pt initially complaining of hip pain. On my PE, Pt well-appearing in NAD, complained of lateral neck pain, chronic back pain.  Denied extremity pain. XRs negative.    EKG Interpretation     Ventricular Rate:  73 PR Interval:  123 QRS Duration: 88 QT Interval:  409 QTC Calculation: 451 R Axis:   39 Text Interpretation:  Sinus rhythm. Normal EKG, uncahnged from prior              Shanna Cisco, MD 07/31/13 1045

## 2013-08-01 DIAGNOSIS — I509 Heart failure, unspecified: Secondary | ICD-10-CM

## 2013-08-01 DIAGNOSIS — I10 Essential (primary) hypertension: Secondary | ICD-10-CM

## 2013-08-01 DIAGNOSIS — G20 Parkinson's disease: Secondary | ICD-10-CM

## 2013-08-01 DIAGNOSIS — R269 Unspecified abnormalities of gait and mobility: Secondary | ICD-10-CM | POA: Diagnosis not present

## 2013-08-01 DIAGNOSIS — I5022 Chronic systolic (congestive) heart failure: Secondary | ICD-10-CM

## 2013-08-01 DIAGNOSIS — G8929 Other chronic pain: Secondary | ICD-10-CM | POA: Diagnosis not present

## 2013-08-01 DIAGNOSIS — Z602 Problems related to living alone: Secondary | ICD-10-CM | POA: Diagnosis not present

## 2013-08-01 DIAGNOSIS — M129 Arthropathy, unspecified: Secondary | ICD-10-CM | POA: Diagnosis not present

## 2013-08-01 DIAGNOSIS — G20A1 Parkinson's disease without dyskinesia, without mention of fluctuations: Secondary | ICD-10-CM

## 2013-08-02 ENCOUNTER — Ambulatory Visit (INDEPENDENT_AMBULATORY_CARE_PROVIDER_SITE_OTHER): Payer: Medicare Other | Admitting: Internal Medicine

## 2013-08-02 ENCOUNTER — Other Ambulatory Visit: Payer: Self-pay | Admitting: Cardiovascular Disease

## 2013-08-02 ENCOUNTER — Encounter: Payer: Self-pay | Admitting: Internal Medicine

## 2013-08-02 VITALS — BP 124/74 | HR 84 | Temp 97.2°F | Wt 181.4 lb

## 2013-08-02 DIAGNOSIS — I5023 Acute on chronic systolic (congestive) heart failure: Secondary | ICD-10-CM

## 2013-08-02 DIAGNOSIS — E042 Nontoxic multinodular goiter: Secondary | ICD-10-CM

## 2013-08-02 DIAGNOSIS — IMO0001 Reserved for inherently not codable concepts without codable children: Secondary | ICD-10-CM

## 2013-08-02 DIAGNOSIS — G20A1 Parkinson's disease without dyskinesia, without mention of fluctuations: Secondary | ICD-10-CM

## 2013-08-02 DIAGNOSIS — G20 Parkinson's disease: Secondary | ICD-10-CM

## 2013-08-02 DIAGNOSIS — IMO0002 Reserved for concepts with insufficient information to code with codable children: Secondary | ICD-10-CM

## 2013-08-02 DIAGNOSIS — D509 Iron deficiency anemia, unspecified: Secondary | ICD-10-CM

## 2013-08-02 MED ORDER — FUROSEMIDE 20 MG PO TABS: 40.0000 mg | ORAL_TABLET | Freq: Two times a day (BID) | ORAL | Status: DC

## 2013-08-02 NOTE — Progress Notes (Signed)
Patient ID: [name], female   DOB: [birth year], 72 y.o.   MRN: [id number] Location:  [facility]  Code Status: DNR   Allergies  Allergen Reactions  . Codeine Other (See Comments)    unknown  . Penicillins Other (See Comments)    unknown    Chief Complaint  Patient presents with  . Acute Visit    f/u from ER on 11/7, still having some back pain   . Immunizations    ? flu vaccine with having Bronchitis    HPI: Patient is a 72 y.o. white female seen in the office today for follow up from prior hospitalization.  Patient fell on her left hip on the 7th, went to the emergency room, no broken bones.states that she got up in the middle of the night to go use the bathroom and she fell when going back to her chair. Feels that she just lost her balance. States that her back is sore but was sore before her fall. Gets a little SOB when her back is really hurting. Pt. States that they found nodules on her thyroid and advised her to follow up with her primary care physician regarding this.   Went to dermatologist and they told her she has seborrheic keratoses.  Has been out of her lasix for three days and has gained 8 lbs since 05/25/13 at one of her neurology visits.    I received something from [facility] today saying that their social worker could not come out until an insurance issue was resolved.  Had two teeth removed.  Review of Systems:  Review of Systems  Constitutional: Negative for fever and chills.  Respiratory: Positive for shortness of breath.        States that she is a little SOB  Cardiovascular: Positive for leg swelling. Negative for chest pain.  Gastrointestinal: Negative for diarrhea and constipation.  Genitourinary: Positive for frequency.       D/t medication  Musculoskeletal: Positive for back pain and falls.       Neck pain  Skin:       Seborrheic keratosis on legs, has two cysts--one on her frontal scalp and one  in the occipital area of her scalp  Neurological: Positive for tingling and tremors. Negative for headaches.       Parkinsons Numbness and tingling in feet was present before the fall  Psychiatric/Behavioral: Negative for depression. The patient is not nervous/anxious.      Past Medical History  Diagnosis Date  . Parkinson's disease   . Hypertension   . Arthritis   . Anemia   . Cellulitis of lower leg 12/24/2011  . Acute bronchitis   . Other abnormal blood chemistry   . Chronic systolic heart failure   . Acute on chronic systolic heart failure   . Cellulitis and abscess of leg, except foot   . Hypotension, unspecified   . Helicobacter pylori (H. pylori)   . Acute gastric ulcer with hemorrhage, without mention of obstruction     Past Surgical History  Procedure Laterality Date  . Hip arthroscopy w/ labral repair    . Thyroidectomy    . Esophagogastroduodenoscopy  09/26/2011    Procedure: ESOPHAGOGASTRODUODENOSCOPY (EGD);  Surgeon: Erick Blinks, MD;  Location: [facility] ENDOSCOPY;  Service: Gastroenterology;  Laterality: N/A;  To be done at bedside.    Social History:   reports that she has never smoked. She has never used smokeless tobacco. She reports that  she does not drink alcohol or use illicit drugs.  Family History  Problem Relation Age of Onset  . GER disease Mother   . Heart disease Father     Medications: Patient's Medications  New Prescriptions   No medications on file  Previous Medications   ALBUTEROL (PROVENTIL) (2.5 MG/3ML) 0.083% NEBULIZER SOLUTION    Take 2.5 mg by nebulization every 6 (six) hours as needed. For shortness of breath   B COMPLEX VITAMINS TABLET    Take 1 tablet by mouth daily.     CARBIDOPA-LEVODOPA (SINEMET CR) 50-200 MG PER TABLET    Take 1 tablet by mouth at bedtime. 10pm   CARBIDOPA-LEVODOPA (SINEMET IR) 25-100 MG PER TABLET    Take 1.5 tablets by mouth 4 (four) times daily. 6am, 10am, 2pm and 6pm   FUROSEMIDE (LASIX) 20 MG TABLET    Take 40 mg  by mouth 2 (two) times daily.   MAGNESIUM OXIDE (MAG-OX) 400 MG TABLET    Take 400 mg by mouth daily.    POTASSIUM CHLORIDE SA (K-DUR,KLOR-CON) 20 MEQ TABLET    Take 20 mEq by mouth every other day.    PRAMIPEXOLE (MIRAPEX) 1.5 MG TABLET    Take 1 tablet (1.5 mg total) by mouth 3 (three) times daily.   TRIAMCINOLONE CREAM (KENALOG) 0.1 %    Apply 1 application topically daily. Applied to legs  Modified Medications   No medications on file  Discontinued Medications   No medications on file     Physical Exam: Filed Vitals:   08/02/13 1352  BP: 124/74  Pulse: 84  Temp: 97.2 F (36.2 C)  TempSrc: Oral  Weight: 181 lb 6.4 oz (82.283 kg)  SpO2: 95%   Physical Exam  Constitutional: She is oriented to person, place, and time. She appears well-developed and well-nourished.  Cardiovascular: Normal rate, regular rhythm, normal heart sounds and intact distal pulses.   Pulmonary/Chest: Effort normal and breath sounds normal.  diminished in the bases, appears dyspneic  Abdominal: Soft. Bowel sounds are normal. She exhibits no distension. There is no tenderness.  umbilical hernia  Musculoskeletal: She exhibits edema.  Tenderness of right lower thoracic/lumbar  Neurological: She is alert and oriented to person, place, and time.  akathisia  Skin: Skin is warm and dry.     Labs reviewed: Basic Metabolic Panel:  Recent Labs  16/10/96 0829 04/09/13 07/30/13 1004  NA 141 140 137  K 4.5 4.7 4.6  CL 102 102 104  CO2 25 25 25   GLUCOSE 101* 91 97  BUN 16 17 13   CREATININE 0.91 0.88 0.63  CALCIUM 9.1 9.3 8.9   Liver Function Tests:  Recent Labs  04/09/13 07/30/13 1004  AST 17 21  ALT 7 <5  ALKPHOS 72 82  BILITOT 0.4 0.5  PROT 6.5 6.3  ALBUMIN  --  3.1*    Recent Labs  04/09/13  LIPASE 18  AMYLASE 28*   CBC:  Recent Labs  03/30/13 0829 04/09/13 07/30/13 1004  WBC 7.6 10.2 8.7  NEUTROABS 5.8 8.2* 6.9  HGB 10.2* 10.4* 11.1*  HCT 33.0* 34.6 34.7*  MCV 72* 72* 75.9*   PLT  --   --  144*   Past Procedures: Reviewed CT brain and cspine from ED, xray chest, ED notes  Assessment/Plan 1. Acute on chronic systolic heart failure - furosemide (LASIX) 20 MG tablet; Take 2 tablets (40 mg total) by mouth 2 (two) times daily.  Dispense: 60 tablet; Refill: 3 -renewed lasix which she needs  to take regularly -apparently, her insurance will change come jan 1st and social work is to help her get a new one  2. Vertebral compression fracture, sequela New based on review from prior imaging studies and new pain on right lower back -advised to resume use of her oxycodone that she has from Dr. Ethelene Hal (has narcotic contract with him)  3. Multiple thyroid nodules Will check TSH first, then plan on ultrasound neck after results known  4. Parkinson's disease Stable with current therapy Follows with neurology  5. Anemia, iron deficiency Improved--hgb in 11 range Cont iron supplement  Labs/tests ordered:  TSH--will plan on thyroid ultrasound when results return Next appt:  3 mos

## 2013-08-03 ENCOUNTER — Other Ambulatory Visit: Payer: Self-pay | Admitting: Internal Medicine

## 2013-08-03 DIAGNOSIS — E042 Nontoxic multinodular goiter: Secondary | ICD-10-CM

## 2013-08-03 DIAGNOSIS — G20 Parkinson's disease: Secondary | ICD-10-CM | POA: Diagnosis not present

## 2013-08-03 DIAGNOSIS — I10 Essential (primary) hypertension: Secondary | ICD-10-CM | POA: Diagnosis not present

## 2013-08-03 DIAGNOSIS — M129 Arthropathy, unspecified: Secondary | ICD-10-CM | POA: Diagnosis not present

## 2013-08-03 DIAGNOSIS — R269 Unspecified abnormalities of gait and mobility: Secondary | ICD-10-CM | POA: Diagnosis not present

## 2013-08-03 DIAGNOSIS — I509 Heart failure, unspecified: Secondary | ICD-10-CM | POA: Diagnosis not present

## 2013-08-03 DIAGNOSIS — I5022 Chronic systolic (congestive) heart failure: Secondary | ICD-10-CM | POA: Diagnosis not present

## 2013-08-03 LAB — TSH: TSH: 1.7 u[IU]/mL (ref 0.450–4.500)

## 2013-08-04 DIAGNOSIS — M129 Arthropathy, unspecified: Secondary | ICD-10-CM | POA: Diagnosis not present

## 2013-08-04 DIAGNOSIS — I10 Essential (primary) hypertension: Secondary | ICD-10-CM | POA: Diagnosis not present

## 2013-08-04 DIAGNOSIS — R269 Unspecified abnormalities of gait and mobility: Secondary | ICD-10-CM | POA: Diagnosis not present

## 2013-08-04 DIAGNOSIS — I5022 Chronic systolic (congestive) heart failure: Secondary | ICD-10-CM | POA: Diagnosis not present

## 2013-08-04 DIAGNOSIS — I509 Heart failure, unspecified: Secondary | ICD-10-CM | POA: Diagnosis not present

## 2013-08-04 DIAGNOSIS — G20 Parkinson's disease: Secondary | ICD-10-CM | POA: Diagnosis not present

## 2013-08-06 ENCOUNTER — Other Ambulatory Visit: Payer: Medicare Other

## 2013-08-06 DIAGNOSIS — M129 Arthropathy, unspecified: Secondary | ICD-10-CM | POA: Diagnosis not present

## 2013-08-06 DIAGNOSIS — G20 Parkinson's disease: Secondary | ICD-10-CM | POA: Diagnosis not present

## 2013-08-06 DIAGNOSIS — I5022 Chronic systolic (congestive) heart failure: Secondary | ICD-10-CM | POA: Diagnosis not present

## 2013-08-06 DIAGNOSIS — I509 Heart failure, unspecified: Secondary | ICD-10-CM | POA: Diagnosis not present

## 2013-08-06 DIAGNOSIS — R269 Unspecified abnormalities of gait and mobility: Secondary | ICD-10-CM | POA: Diagnosis not present

## 2013-08-06 DIAGNOSIS — I10 Essential (primary) hypertension: Secondary | ICD-10-CM | POA: Diagnosis not present

## 2013-08-06 NOTE — Telephone Encounter (Signed)
I called patient to assure she got her medication, PRAMIPEXOLE. Notes states that it was to be sent to mail order. Patient stated that was in error and she has gotten her medication.

## 2013-08-09 DIAGNOSIS — I10 Essential (primary) hypertension: Secondary | ICD-10-CM | POA: Diagnosis not present

## 2013-08-09 DIAGNOSIS — I5022 Chronic systolic (congestive) heart failure: Secondary | ICD-10-CM | POA: Diagnosis not present

## 2013-08-09 DIAGNOSIS — R269 Unspecified abnormalities of gait and mobility: Secondary | ICD-10-CM | POA: Diagnosis not present

## 2013-08-09 DIAGNOSIS — I509 Heart failure, unspecified: Secondary | ICD-10-CM | POA: Diagnosis not present

## 2013-08-09 DIAGNOSIS — M129 Arthropathy, unspecified: Secondary | ICD-10-CM | POA: Diagnosis not present

## 2013-08-09 DIAGNOSIS — G20 Parkinson's disease: Secondary | ICD-10-CM | POA: Diagnosis not present

## 2013-08-10 ENCOUNTER — Ambulatory Visit
Admission: RE | Admit: 2013-08-10 | Discharge: 2013-08-10 | Disposition: A | Discharge status: Home / Self Care | Payer: Medicare Other | Source: Ambulatory Visit | Attending: Internal Medicine | Admitting: Internal Medicine

## 2013-08-10 DIAGNOSIS — R269 Unspecified abnormalities of gait and mobility: Secondary | ICD-10-CM | POA: Diagnosis not present

## 2013-08-10 DIAGNOSIS — I509 Heart failure, unspecified: Secondary | ICD-10-CM | POA: Diagnosis not present

## 2013-08-10 DIAGNOSIS — I10 Essential (primary) hypertension: Secondary | ICD-10-CM | POA: Diagnosis not present

## 2013-08-10 DIAGNOSIS — E042 Nontoxic multinodular goiter: Secondary | ICD-10-CM

## 2013-08-10 DIAGNOSIS — M129 Arthropathy, unspecified: Secondary | ICD-10-CM | POA: Diagnosis not present

## 2013-08-10 DIAGNOSIS — G20 Parkinson's disease: Secondary | ICD-10-CM | POA: Diagnosis not present

## 2013-08-10 DIAGNOSIS — I5022 Chronic systolic (congestive) heart failure: Secondary | ICD-10-CM | POA: Diagnosis not present

## 2013-08-11 DIAGNOSIS — R269 Unspecified abnormalities of gait and mobility: Secondary | ICD-10-CM | POA: Diagnosis not present

## 2013-08-11 DIAGNOSIS — G20 Parkinson's disease: Secondary | ICD-10-CM | POA: Diagnosis not present

## 2013-08-11 DIAGNOSIS — I509 Heart failure, unspecified: Secondary | ICD-10-CM | POA: Diagnosis not present

## 2013-08-11 DIAGNOSIS — I5022 Chronic systolic (congestive) heart failure: Secondary | ICD-10-CM | POA: Diagnosis not present

## 2013-08-11 DIAGNOSIS — I10 Essential (primary) hypertension: Secondary | ICD-10-CM | POA: Diagnosis not present

## 2013-08-11 DIAGNOSIS — M129 Arthropathy, unspecified: Secondary | ICD-10-CM | POA: Diagnosis not present

## 2013-08-12 DIAGNOSIS — I509 Heart failure, unspecified: Secondary | ICD-10-CM | POA: Diagnosis not present

## 2013-08-12 DIAGNOSIS — M129 Arthropathy, unspecified: Secondary | ICD-10-CM | POA: Diagnosis not present

## 2013-08-12 DIAGNOSIS — G20 Parkinson's disease: Secondary | ICD-10-CM | POA: Diagnosis not present

## 2013-08-12 DIAGNOSIS — I10 Essential (primary) hypertension: Secondary | ICD-10-CM | POA: Diagnosis not present

## 2013-08-12 DIAGNOSIS — R269 Unspecified abnormalities of gait and mobility: Secondary | ICD-10-CM | POA: Diagnosis not present

## 2013-08-12 DIAGNOSIS — I5022 Chronic systolic (congestive) heart failure: Secondary | ICD-10-CM | POA: Diagnosis not present

## 2013-08-13 DIAGNOSIS — I10 Essential (primary) hypertension: Secondary | ICD-10-CM | POA: Diagnosis not present

## 2013-08-13 DIAGNOSIS — G20 Parkinson's disease: Secondary | ICD-10-CM | POA: Diagnosis not present

## 2013-08-13 DIAGNOSIS — M129 Arthropathy, unspecified: Secondary | ICD-10-CM | POA: Diagnosis not present

## 2013-08-13 DIAGNOSIS — I5022 Chronic systolic (congestive) heart failure: Secondary | ICD-10-CM | POA: Diagnosis not present

## 2013-08-13 DIAGNOSIS — I509 Heart failure, unspecified: Secondary | ICD-10-CM | POA: Diagnosis not present

## 2013-08-13 DIAGNOSIS — R269 Unspecified abnormalities of gait and mobility: Secondary | ICD-10-CM | POA: Diagnosis not present

## 2013-08-15 ENCOUNTER — Other Ambulatory Visit: Payer: Self-pay | Admitting: Internal Medicine

## 2013-08-15 DIAGNOSIS — E041 Nontoxic single thyroid nodule: Secondary | ICD-10-CM

## 2013-08-16 DIAGNOSIS — M129 Arthropathy, unspecified: Secondary | ICD-10-CM | POA: Diagnosis not present

## 2013-08-16 DIAGNOSIS — I10 Essential (primary) hypertension: Secondary | ICD-10-CM | POA: Diagnosis not present

## 2013-08-16 DIAGNOSIS — I5022 Chronic systolic (congestive) heart failure: Secondary | ICD-10-CM | POA: Diagnosis not present

## 2013-08-16 DIAGNOSIS — R269 Unspecified abnormalities of gait and mobility: Secondary | ICD-10-CM | POA: Diagnosis not present

## 2013-08-16 DIAGNOSIS — I509 Heart failure, unspecified: Secondary | ICD-10-CM | POA: Diagnosis not present

## 2013-08-16 DIAGNOSIS — G20 Parkinson's disease: Secondary | ICD-10-CM | POA: Diagnosis not present

## 2013-08-17 ENCOUNTER — Other Ambulatory Visit: Payer: Self-pay | Admitting: Internal Medicine

## 2013-08-17 DIAGNOSIS — I5022 Chronic systolic (congestive) heart failure: Secondary | ICD-10-CM | POA: Diagnosis not present

## 2013-08-17 DIAGNOSIS — E041 Nontoxic single thyroid nodule: Secondary | ICD-10-CM

## 2013-08-17 DIAGNOSIS — M129 Arthropathy, unspecified: Secondary | ICD-10-CM | POA: Diagnosis not present

## 2013-08-17 DIAGNOSIS — I509 Heart failure, unspecified: Secondary | ICD-10-CM | POA: Diagnosis not present

## 2013-08-17 DIAGNOSIS — R269 Unspecified abnormalities of gait and mobility: Secondary | ICD-10-CM | POA: Diagnosis not present

## 2013-08-17 DIAGNOSIS — G20 Parkinson's disease: Secondary | ICD-10-CM | POA: Diagnosis not present

## 2013-08-17 DIAGNOSIS — I10 Essential (primary) hypertension: Secondary | ICD-10-CM | POA: Diagnosis not present

## 2013-08-18 DIAGNOSIS — G20 Parkinson's disease: Secondary | ICD-10-CM | POA: Diagnosis not present

## 2013-08-18 DIAGNOSIS — I5022 Chronic systolic (congestive) heart failure: Secondary | ICD-10-CM | POA: Diagnosis not present

## 2013-08-18 DIAGNOSIS — M129 Arthropathy, unspecified: Secondary | ICD-10-CM | POA: Diagnosis not present

## 2013-08-18 DIAGNOSIS — I10 Essential (primary) hypertension: Secondary | ICD-10-CM | POA: Diagnosis not present

## 2013-08-18 DIAGNOSIS — R269 Unspecified abnormalities of gait and mobility: Secondary | ICD-10-CM | POA: Diagnosis not present

## 2013-08-18 DIAGNOSIS — I509 Heart failure, unspecified: Secondary | ICD-10-CM | POA: Diagnosis not present

## 2013-08-20 DIAGNOSIS — R269 Unspecified abnormalities of gait and mobility: Secondary | ICD-10-CM | POA: Diagnosis not present

## 2013-08-20 DIAGNOSIS — G20 Parkinson's disease: Secondary | ICD-10-CM | POA: Diagnosis not present

## 2013-08-20 DIAGNOSIS — I509 Heart failure, unspecified: Secondary | ICD-10-CM | POA: Diagnosis not present

## 2013-08-20 DIAGNOSIS — I5022 Chronic systolic (congestive) heart failure: Secondary | ICD-10-CM | POA: Diagnosis not present

## 2013-08-20 DIAGNOSIS — M129 Arthropathy, unspecified: Secondary | ICD-10-CM | POA: Diagnosis not present

## 2013-08-20 DIAGNOSIS — I10 Essential (primary) hypertension: Secondary | ICD-10-CM | POA: Diagnosis not present

## 2013-08-23 DIAGNOSIS — G20 Parkinson's disease: Secondary | ICD-10-CM | POA: Diagnosis not present

## 2013-08-23 DIAGNOSIS — M129 Arthropathy, unspecified: Secondary | ICD-10-CM | POA: Diagnosis not present

## 2013-08-23 DIAGNOSIS — I509 Heart failure, unspecified: Secondary | ICD-10-CM | POA: Diagnosis not present

## 2013-08-23 DIAGNOSIS — I5022 Chronic systolic (congestive) heart failure: Secondary | ICD-10-CM | POA: Diagnosis not present

## 2013-08-23 DIAGNOSIS — R269 Unspecified abnormalities of gait and mobility: Secondary | ICD-10-CM | POA: Diagnosis not present

## 2013-08-23 DIAGNOSIS — I10 Essential (primary) hypertension: Secondary | ICD-10-CM | POA: Diagnosis not present

## 2013-08-24 ENCOUNTER — Other Ambulatory Visit (HOSPITAL_COMMUNITY)
Admission: RE | Admit: 2013-08-24 | Discharge: 2013-08-24 | Disposition: A | Discharge status: Home / Self Care | Payer: Medicare Other | Source: Ambulatory Visit | Attending: Diagnostic Radiology | Admitting: Diagnostic Radiology

## 2013-08-24 ENCOUNTER — Ambulatory Visit
Admission: RE | Admit: 2013-08-24 | Discharge: 2013-08-24 | Disposition: A | Discharge status: Home / Self Care | Payer: Medicare Other | Source: Ambulatory Visit | Attending: Internal Medicine | Admitting: Internal Medicine

## 2013-08-24 DIAGNOSIS — E041 Nontoxic single thyroid nodule: Secondary | ICD-10-CM

## 2013-08-24 DIAGNOSIS — M129 Arthropathy, unspecified: Secondary | ICD-10-CM | POA: Diagnosis not present

## 2013-08-24 DIAGNOSIS — I10 Essential (primary) hypertension: Secondary | ICD-10-CM | POA: Diagnosis not present

## 2013-08-24 DIAGNOSIS — I509 Heart failure, unspecified: Secondary | ICD-10-CM | POA: Diagnosis not present

## 2013-08-24 DIAGNOSIS — I5022 Chronic systolic (congestive) heart failure: Secondary | ICD-10-CM | POA: Diagnosis not present

## 2013-08-24 DIAGNOSIS — G20 Parkinson's disease: Secondary | ICD-10-CM | POA: Diagnosis not present

## 2013-08-24 DIAGNOSIS — E049 Nontoxic goiter, unspecified: Secondary | ICD-10-CM | POA: Diagnosis not present

## 2013-08-24 DIAGNOSIS — R269 Unspecified abnormalities of gait and mobility: Secondary | ICD-10-CM | POA: Diagnosis not present

## 2013-08-25 DIAGNOSIS — I509 Heart failure, unspecified: Secondary | ICD-10-CM | POA: Diagnosis not present

## 2013-08-25 DIAGNOSIS — I5022 Chronic systolic (congestive) heart failure: Secondary | ICD-10-CM | POA: Diagnosis not present

## 2013-08-25 DIAGNOSIS — I10 Essential (primary) hypertension: Secondary | ICD-10-CM | POA: Diagnosis not present

## 2013-08-25 DIAGNOSIS — M129 Arthropathy, unspecified: Secondary | ICD-10-CM | POA: Diagnosis not present

## 2013-08-25 DIAGNOSIS — G20 Parkinson's disease: Secondary | ICD-10-CM | POA: Diagnosis not present

## 2013-08-25 DIAGNOSIS — R269 Unspecified abnormalities of gait and mobility: Secondary | ICD-10-CM | POA: Diagnosis not present

## 2013-08-26 DIAGNOSIS — I10 Essential (primary) hypertension: Secondary | ICD-10-CM | POA: Diagnosis not present

## 2013-08-26 DIAGNOSIS — G20 Parkinson's disease: Secondary | ICD-10-CM | POA: Diagnosis not present

## 2013-08-26 DIAGNOSIS — R269 Unspecified abnormalities of gait and mobility: Secondary | ICD-10-CM | POA: Diagnosis not present

## 2013-08-26 DIAGNOSIS — I509 Heart failure, unspecified: Secondary | ICD-10-CM | POA: Diagnosis not present

## 2013-08-26 DIAGNOSIS — M129 Arthropathy, unspecified: Secondary | ICD-10-CM | POA: Diagnosis not present

## 2013-08-26 DIAGNOSIS — I5022 Chronic systolic (congestive) heart failure: Secondary | ICD-10-CM | POA: Diagnosis not present

## 2013-08-27 DIAGNOSIS — I509 Heart failure, unspecified: Secondary | ICD-10-CM | POA: Diagnosis not present

## 2013-08-27 DIAGNOSIS — I5022 Chronic systolic (congestive) heart failure: Secondary | ICD-10-CM | POA: Diagnosis not present

## 2013-08-27 DIAGNOSIS — G20 Parkinson's disease: Secondary | ICD-10-CM | POA: Diagnosis not present

## 2013-08-27 DIAGNOSIS — R269 Unspecified abnormalities of gait and mobility: Secondary | ICD-10-CM | POA: Diagnosis not present

## 2013-08-27 DIAGNOSIS — I10 Essential (primary) hypertension: Secondary | ICD-10-CM | POA: Diagnosis not present

## 2013-08-27 DIAGNOSIS — M129 Arthropathy, unspecified: Secondary | ICD-10-CM | POA: Diagnosis not present

## 2013-08-30 DIAGNOSIS — R269 Unspecified abnormalities of gait and mobility: Secondary | ICD-10-CM | POA: Diagnosis not present

## 2013-08-30 DIAGNOSIS — M129 Arthropathy, unspecified: Secondary | ICD-10-CM | POA: Diagnosis not present

## 2013-08-30 DIAGNOSIS — G20 Parkinson's disease: Secondary | ICD-10-CM | POA: Diagnosis not present

## 2013-08-30 DIAGNOSIS — I5022 Chronic systolic (congestive) heart failure: Secondary | ICD-10-CM | POA: Diagnosis not present

## 2013-08-30 DIAGNOSIS — I10 Essential (primary) hypertension: Secondary | ICD-10-CM | POA: Diagnosis not present

## 2013-08-30 DIAGNOSIS — I509 Heart failure, unspecified: Secondary | ICD-10-CM | POA: Diagnosis not present

## 2013-09-01 DIAGNOSIS — G20 Parkinson's disease: Secondary | ICD-10-CM | POA: Diagnosis not present

## 2013-09-01 DIAGNOSIS — M129 Arthropathy, unspecified: Secondary | ICD-10-CM | POA: Diagnosis not present

## 2013-09-01 DIAGNOSIS — R269 Unspecified abnormalities of gait and mobility: Secondary | ICD-10-CM | POA: Diagnosis not present

## 2013-09-01 DIAGNOSIS — I10 Essential (primary) hypertension: Secondary | ICD-10-CM | POA: Diagnosis not present

## 2013-09-01 DIAGNOSIS — I509 Heart failure, unspecified: Secondary | ICD-10-CM | POA: Diagnosis not present

## 2013-09-01 DIAGNOSIS — I5022 Chronic systolic (congestive) heart failure: Secondary | ICD-10-CM | POA: Diagnosis not present

## 2013-09-03 DIAGNOSIS — R269 Unspecified abnormalities of gait and mobility: Secondary | ICD-10-CM | POA: Diagnosis not present

## 2013-09-03 DIAGNOSIS — M129 Arthropathy, unspecified: Secondary | ICD-10-CM | POA: Diagnosis not present

## 2013-09-03 DIAGNOSIS — G20 Parkinson's disease: Secondary | ICD-10-CM | POA: Diagnosis not present

## 2013-09-03 DIAGNOSIS — I5022 Chronic systolic (congestive) heart failure: Secondary | ICD-10-CM | POA: Diagnosis not present

## 2013-09-03 DIAGNOSIS — I10 Essential (primary) hypertension: Secondary | ICD-10-CM | POA: Diagnosis not present

## 2013-09-03 DIAGNOSIS — I509 Heart failure, unspecified: Secondary | ICD-10-CM | POA: Diagnosis not present

## 2013-09-06 DIAGNOSIS — M129 Arthropathy, unspecified: Secondary | ICD-10-CM | POA: Diagnosis not present

## 2013-09-06 DIAGNOSIS — R269 Unspecified abnormalities of gait and mobility: Secondary | ICD-10-CM | POA: Diagnosis not present

## 2013-09-06 DIAGNOSIS — I10 Essential (primary) hypertension: Secondary | ICD-10-CM | POA: Diagnosis not present

## 2013-09-06 DIAGNOSIS — I509 Heart failure, unspecified: Secondary | ICD-10-CM | POA: Diagnosis not present

## 2013-09-06 DIAGNOSIS — I5022 Chronic systolic (congestive) heart failure: Secondary | ICD-10-CM | POA: Diagnosis not present

## 2013-09-06 DIAGNOSIS — G20 Parkinson's disease: Secondary | ICD-10-CM | POA: Diagnosis not present

## 2013-09-07 DIAGNOSIS — G20 Parkinson's disease: Secondary | ICD-10-CM | POA: Diagnosis not present

## 2013-09-07 DIAGNOSIS — I10 Essential (primary) hypertension: Secondary | ICD-10-CM | POA: Diagnosis not present

## 2013-09-07 DIAGNOSIS — I5022 Chronic systolic (congestive) heart failure: Secondary | ICD-10-CM | POA: Diagnosis not present

## 2013-09-07 DIAGNOSIS — R269 Unspecified abnormalities of gait and mobility: Secondary | ICD-10-CM | POA: Diagnosis not present

## 2013-09-07 DIAGNOSIS — I509 Heart failure, unspecified: Secondary | ICD-10-CM | POA: Diagnosis not present

## 2013-09-07 DIAGNOSIS — M129 Arthropathy, unspecified: Secondary | ICD-10-CM | POA: Diagnosis not present

## 2013-09-08 DIAGNOSIS — I509 Heart failure, unspecified: Secondary | ICD-10-CM | POA: Diagnosis not present

## 2013-09-08 DIAGNOSIS — G20 Parkinson's disease: Secondary | ICD-10-CM | POA: Diagnosis not present

## 2013-09-08 DIAGNOSIS — I10 Essential (primary) hypertension: Secondary | ICD-10-CM | POA: Diagnosis not present

## 2013-09-08 DIAGNOSIS — R269 Unspecified abnormalities of gait and mobility: Secondary | ICD-10-CM | POA: Diagnosis not present

## 2013-09-08 DIAGNOSIS — I5022 Chronic systolic (congestive) heart failure: Secondary | ICD-10-CM | POA: Diagnosis not present

## 2013-09-08 DIAGNOSIS — M129 Arthropathy, unspecified: Secondary | ICD-10-CM | POA: Diagnosis not present

## 2013-09-10 DIAGNOSIS — I10 Essential (primary) hypertension: Secondary | ICD-10-CM | POA: Diagnosis not present

## 2013-09-10 DIAGNOSIS — I5022 Chronic systolic (congestive) heart failure: Secondary | ICD-10-CM | POA: Diagnosis not present

## 2013-09-10 DIAGNOSIS — I509 Heart failure, unspecified: Secondary | ICD-10-CM | POA: Diagnosis not present

## 2013-09-10 DIAGNOSIS — R269 Unspecified abnormalities of gait and mobility: Secondary | ICD-10-CM | POA: Diagnosis not present

## 2013-09-10 DIAGNOSIS — M129 Arthropathy, unspecified: Secondary | ICD-10-CM | POA: Diagnosis not present

## 2013-09-10 DIAGNOSIS — G20 Parkinson's disease: Secondary | ICD-10-CM | POA: Diagnosis not present

## 2013-09-13 DIAGNOSIS — R269 Unspecified abnormalities of gait and mobility: Secondary | ICD-10-CM | POA: Diagnosis not present

## 2013-09-13 DIAGNOSIS — I10 Essential (primary) hypertension: Secondary | ICD-10-CM | POA: Diagnosis not present

## 2013-09-13 DIAGNOSIS — I5022 Chronic systolic (congestive) heart failure: Secondary | ICD-10-CM | POA: Diagnosis not present

## 2013-09-13 DIAGNOSIS — I509 Heart failure, unspecified: Secondary | ICD-10-CM | POA: Diagnosis not present

## 2013-09-13 DIAGNOSIS — M129 Arthropathy, unspecified: Secondary | ICD-10-CM | POA: Diagnosis not present

## 2013-09-13 DIAGNOSIS — G20 Parkinson's disease: Secondary | ICD-10-CM | POA: Diagnosis not present

## 2013-09-14 ENCOUNTER — Ambulatory Visit (INDEPENDENT_AMBULATORY_CARE_PROVIDER_SITE_OTHER): Payer: Medicare Other | Admitting: *Deleted

## 2013-09-14 DIAGNOSIS — Z23 Encounter for immunization: Secondary | ICD-10-CM | POA: Diagnosis not present

## 2013-09-29 ENCOUNTER — Ambulatory Visit (INDEPENDENT_AMBULATORY_CARE_PROVIDER_SITE_OTHER): Payer: Medicare Other | Admitting: Internal Medicine

## 2013-09-29 ENCOUNTER — Telehealth: Payer: Self-pay | Admitting: Internal Medicine

## 2013-09-29 ENCOUNTER — Inpatient Hospital Stay (HOSPITAL_COMMUNITY)
Admission: EM | Admit: 2013-09-29 | Discharge: 2013-10-05 | DRG: 378 | Disposition: A | Discharge status: Rehabilitation Facility | Payer: Medicare Other | Attending: Internal Medicine | Admitting: Internal Medicine

## 2013-09-29 ENCOUNTER — Encounter: Payer: Self-pay | Admitting: Internal Medicine

## 2013-09-29 VITALS — BP 120/78 | HR 90 | Temp 97.1°F | Wt 180.0 lb

## 2013-09-29 DIAGNOSIS — I2781 Cor pulmonale (chronic): Secondary | ICD-10-CM

## 2013-09-29 DIAGNOSIS — L03119 Cellulitis of unspecified part of limb: Secondary | ICD-10-CM

## 2013-09-29 DIAGNOSIS — K449 Diaphragmatic hernia without obstruction or gangrene: Secondary | ICD-10-CM | POA: Diagnosis present

## 2013-09-29 DIAGNOSIS — I5032 Chronic diastolic (congestive) heart failure: Secondary | ICD-10-CM | POA: Diagnosis not present

## 2013-09-29 DIAGNOSIS — I279 Pulmonary heart disease, unspecified: Secondary | ICD-10-CM | POA: Diagnosis present

## 2013-09-29 DIAGNOSIS — K2210 Ulcer of esophagus without bleeding: Secondary | ICD-10-CM

## 2013-09-29 DIAGNOSIS — D62 Acute posthemorrhagic anemia: Secondary | ICD-10-CM | POA: Diagnosis not present

## 2013-09-29 DIAGNOSIS — G20 Parkinson's disease: Secondary | ICD-10-CM | POA: Diagnosis present

## 2013-09-29 DIAGNOSIS — R0600 Dyspnea, unspecified: Secondary | ICD-10-CM

## 2013-09-29 DIAGNOSIS — J189 Pneumonia, unspecified organism: Secondary | ICD-10-CM

## 2013-09-29 DIAGNOSIS — R55 Syncope and collapse: Secondary | ICD-10-CM

## 2013-09-29 DIAGNOSIS — D72829 Elevated white blood cell count, unspecified: Secondary | ICD-10-CM

## 2013-09-29 DIAGNOSIS — D649 Anemia, unspecified: Secondary | ICD-10-CM

## 2013-09-29 DIAGNOSIS — K625 Hemorrhage of anus and rectum: Secondary | ICD-10-CM

## 2013-09-29 DIAGNOSIS — K208 Other esophagitis without bleeding: Secondary | ICD-10-CM | POA: Diagnosis present

## 2013-09-29 DIAGNOSIS — K259 Gastric ulcer, unspecified as acute or chronic, without hemorrhage or perforation: Secondary | ICD-10-CM | POA: Diagnosis not present

## 2013-09-29 DIAGNOSIS — D509 Iron deficiency anemia, unspecified: Secondary | ICD-10-CM

## 2013-09-29 DIAGNOSIS — I10 Essential (primary) hypertension: Secondary | ICD-10-CM | POA: Diagnosis present

## 2013-09-29 DIAGNOSIS — R5381 Other malaise: Secondary | ICD-10-CM | POA: Diagnosis present

## 2013-09-29 DIAGNOSIS — K254 Chronic or unspecified gastric ulcer with hemorrhage: Secondary | ICD-10-CM | POA: Diagnosis not present

## 2013-09-29 DIAGNOSIS — G20A1 Parkinson's disease without dyskinesia, without mention of fluctuations: Secondary | ICD-10-CM | POA: Diagnosis not present

## 2013-09-29 DIAGNOSIS — R9431 Abnormal electrocardiogram [ECG] [EKG]: Secondary | ICD-10-CM

## 2013-09-29 DIAGNOSIS — K920 Hematemesis: Secondary | ICD-10-CM

## 2013-09-29 DIAGNOSIS — D696 Thrombocytopenia, unspecified: Secondary | ICD-10-CM | POA: Diagnosis present

## 2013-09-29 DIAGNOSIS — I5189 Other ill-defined heart diseases: Secondary | ICD-10-CM

## 2013-09-29 DIAGNOSIS — K253 Acute gastric ulcer without hemorrhage or perforation: Secondary | ICD-10-CM

## 2013-09-29 DIAGNOSIS — K2970 Gastritis, unspecified, without bleeding: Secondary | ICD-10-CM | POA: Diagnosis not present

## 2013-09-29 DIAGNOSIS — I519 Heart disease, unspecified: Secondary | ICD-10-CM

## 2013-09-29 DIAGNOSIS — R338 Other retention of urine: Secondary | ICD-10-CM

## 2013-09-29 DIAGNOSIS — R5383 Other fatigue: Secondary | ICD-10-CM

## 2013-09-29 DIAGNOSIS — I509 Heart failure, unspecified: Secondary | ICD-10-CM | POA: Diagnosis present

## 2013-09-29 DIAGNOSIS — K250 Acute gastric ulcer with hemorrhage: Secondary | ICD-10-CM | POA: Diagnosis not present

## 2013-09-29 DIAGNOSIS — I5022 Chronic systolic (congestive) heart failure: Secondary | ICD-10-CM | POA: Diagnosis not present

## 2013-09-29 DIAGNOSIS — K922 Gastrointestinal hemorrhage, unspecified: Secondary | ICD-10-CM | POA: Diagnosis not present

## 2013-09-29 DIAGNOSIS — K219 Gastro-esophageal reflux disease without esophagitis: Secondary | ICD-10-CM | POA: Diagnosis present

## 2013-09-29 DIAGNOSIS — R0989 Other specified symptoms and signs involving the circulatory and respiratory systems: Secondary | ICD-10-CM

## 2013-09-29 DIAGNOSIS — IMO0002 Reserved for concepts with insufficient information to code with codable children: Secondary | ICD-10-CM

## 2013-09-29 DIAGNOSIS — Z5189 Encounter for other specified aftercare: Secondary | ICD-10-CM | POA: Diagnosis not present

## 2013-09-29 LAB — BASIC METABOLIC PANEL
BUN: 43 mg/dL — ABNORMAL HIGH (ref 6–23)
CALCIUM: 8.2 mg/dL — AB (ref 8.4–10.5)
CO2: 23 mEq/L (ref 19–32)
CREATININE: 0.83 mg/dL (ref 0.50–1.10)
Chloride: 107 mEq/L (ref 96–112)
GFR calc Af Amer: 79 mL/min — ABNORMAL LOW (ref >90)
GFR calc non Af Amer: 68 mL/min — ABNORMAL LOW (ref >90)
GLUCOSE: 94 mg/dL (ref 70–99)
Potassium: 4.7 mEq/L (ref 3.7–5.3)
Sodium: 143 mEq/L (ref 137–147)

## 2013-09-29 LAB — CBC WITH DIFFERENTIAL/PLATELET
Basophils Absolute: 0.1 10*3/uL (ref 0.0–0.2)
Basophils Absolute: 0 10*3/uL (ref 0.0–0.1)
Basophils Relative: 0 % (ref 0–1)
Basos: 1 %
EOS ABS: 0.1 10*3/uL (ref 0.0–0.7)
EOS PCT: 1 % (ref 0–5)
EOS: 0 %
Eosinophils Absolute: 0 10*3/uL (ref 0.0–0.4)
HEMATOCRIT: 18.4 % — AB (ref 34.0–46.6)
HEMATOCRIT: 17.2 % — AB (ref 36.0–46.0)
HEMOGLOBIN: 5.8 g/dL — AB (ref 11.1–15.9)
Hemoglobin: 5.5 g/dL — CL (ref 12.0–15.0)
IMMATURE GRANULOCYTES: 10 %
Immature Grans (Abs): 1.3 10*3/uL — ABNORMAL HIGH (ref 0.0–0.1)
LYMPHS ABS: 1.2 10*3/uL (ref 0.7–3.1)
LYMPHS ABS: 1.3 10*3/uL (ref 0.7–4.0)
Lymphocytes Relative: 10 % — ABNORMAL LOW (ref 12–46)
Lymphs: 9 %
MCH: 24.5 pg — ABNORMAL LOW (ref 26.6–33.0)
MCH: 25 pg — AB (ref 26.0–34.0)
MCHC: 31.5 g/dL (ref 31.5–35.7)
MCHC: 32 g/dL (ref 30.0–36.0)
MCV: 78 fL — AB (ref 79–97)
MCV: 78.2 fL (ref 78.0–100.0)
MONO ABS: 0.7 10*3/uL (ref 0.1–1.0)
Monocytes Absolute: 0.9 10*3/uL (ref 0.1–0.9)
Monocytes Relative: 6 % (ref 3–12)
Monocytes: 7 %
Neutro Abs: 10.2 10*3/uL — ABNORMAL HIGH (ref 1.7–7.7)
Neutrophils Absolute: 9.5 10*3/uL — ABNORMAL HIGH (ref 1.4–7.0)
Neutrophils Relative %: 73 %
Neutrophils Relative %: 83 % — ABNORMAL HIGH (ref 43–77)
Platelets: 142 10*3/uL — ABNORMAL LOW (ref 150–400)
RBC: 2.37 x10E6/uL — CL (ref 3.77–5.28)
RBC: 2.2 MIL/uL — ABNORMAL LOW (ref 3.87–5.11)
RDW: 19 % — AB (ref 12.3–15.4)
RDW: 19.3 % — AB (ref 11.5–15.5)
WBC: 13 10*3/uL — ABNORMAL HIGH (ref 3.4–10.8)
WBC: 12.2 10*3/uL — AB (ref 4.0–10.5)

## 2013-09-29 LAB — URINE MICROSCOPIC-ADD ON

## 2013-09-29 LAB — URINALYSIS, ROUTINE W REFLEX MICROSCOPIC
BILIRUBIN URINE: NEGATIVE
GLUCOSE, UA: NEGATIVE mg/dL
Ketones, ur: 15 mg/dL — AB
Nitrite: NEGATIVE
PROTEIN: NEGATIVE mg/dL
Specific Gravity, Urine: 1.024 (ref 1.005–1.030)
Urobilinogen, UA: 0.2 mg/dL (ref 0.0–1.0)
pH: 5 (ref 5.0–8.0)

## 2013-09-29 LAB — COMPREHENSIVE METABOLIC PANEL
ALBUMIN: 3.3 g/dL — AB (ref 3.5–4.8)
ALK PHOS: 52 IU/L (ref 39–117)
ALT: 5 IU/L (ref 0–32)
AST: 13 IU/L (ref 0–40)
Albumin/Globulin Ratio: 2.1 (ref 1.1–2.5)
BUN / CREAT RATIO: 40 — AB (ref 11–26)
BUN: 40 mg/dL — AB (ref 8–27)
CO2: 21 mmol/L (ref 18–29)
CREATININE: 0.99 mg/dL (ref 0.57–1.00)
Calcium: 8.8 mg/dL (ref 8.6–10.2)
Chloride: 104 mmol/L (ref 97–108)
GFR calc Af Amer: 65 mL/min/{1.73_m2} (ref >59)
GFR, EST NON AFRICAN AMERICAN: 57 mL/min/{1.73_m2} — AB (ref >59)
GLOBULIN, TOTAL: 1.6 g/dL (ref 1.5–4.5)
Glucose: 112 mg/dL — ABNORMAL HIGH (ref 65–99)
Potassium: 4.4 mmol/L (ref 3.5–5.2)
Sodium: 140 mmol/L (ref 134–144)
Total Bilirubin: 0.3 mg/dL (ref 0.0–1.2)
Total Protein: 4.9 g/dL — ABNORMAL LOW (ref 6.0–8.5)

## 2013-09-29 LAB — PROTIME-INR
INR: 1.27 (ref 0.00–1.49)
Prothrombin Time: 15.6 seconds — ABNORMAL HIGH (ref 11.6–15.2)

## 2013-09-29 LAB — PREPARE RBC (CROSSMATCH)

## 2013-09-29 LAB — OCCULT BLOOD, POC DEVICE: FECAL OCCULT BLD: POSITIVE — AB

## 2013-09-29 LAB — APTT: APTT: 28 s (ref 24–37)

## 2013-09-29 LAB — MRSA PCR SCREENING: MRSA by PCR: NEGATIVE

## 2013-09-29 MED ORDER — CARBIDOPA-LEVODOPA ER 50-200 MG PO TBCR
1.0000 | EXTENDED_RELEASE_TABLET | Freq: Every day | ORAL | Status: DC
  Administered 2013-09-29 – 2013-10-04 (×6): 1 via ORAL
  Filled 2013-09-29 (×8): qty 1

## 2013-09-29 MED ORDER — SODIUM CHLORIDE 0.9 % IV SOLN
8.0000 mg/h | INTRAVENOUS | Status: AC
  Administered 2013-09-29 – 2013-10-02 (×6): 8 mg/h via INTRAVENOUS
  Filled 2013-09-29 (×13): qty 80

## 2013-09-29 MED ORDER — ONDANSETRON HCL 4 MG PO TABS: 4.0000 mg | ORAL_TABLET | Freq: Four times a day (QID) | ORAL | Status: DC | PRN

## 2013-09-29 MED ORDER — ALBUTEROL SULFATE (2.5 MG/3ML) 0.083% IN NEBU
2.5000 mg | INHALATION_SOLUTION | RESPIRATORY_TRACT | Status: DC | PRN
  Administered 2013-10-05: 2.5 mg via RESPIRATORY_TRACT
  Filled 2013-09-29: qty 3

## 2013-09-29 MED ORDER — IPRATROPIUM-ALBUTEROL 0.5-2.5 (3) MG/3ML IN SOLN
3.0000 mL | Freq: Four times a day (QID) | RESPIRATORY_TRACT | Status: DC
  Administered 2013-09-30 (×2): 3 mL via RESPIRATORY_TRACT
  Filled 2013-09-29 (×31): qty 3

## 2013-09-29 MED ORDER — DIPHENHYDRAMINE HCL 50 MG/ML IJ SOLN
25.0000 mg | Freq: Once | INTRAMUSCULAR | Status: AC
  Administered 2013-09-29: 25 mg via INTRAVENOUS
  Filled 2013-09-29: qty 1

## 2013-09-29 MED ORDER — SODIUM CHLORIDE 0.9 % IV SOLN
80.0000 mg | Freq: Once | INTRAVENOUS | Status: AC
  Administered 2013-09-29: 80 mg via INTRAVENOUS
  Filled 2013-09-29: qty 80

## 2013-09-29 MED ORDER — CARBIDOPA-LEVODOPA 25-100 MG PO TABS
1.5000 | ORAL_TABLET | Freq: Four times a day (QID) | ORAL | Status: DC
  Administered 2013-09-29 – 2013-10-05 (×24): 1.5 via ORAL
  Filled 2013-09-29 (×2): qty 1.5
  Filled 2013-09-29: qty 2
  Filled 2013-09-29 (×3): qty 1.5
  Filled 2013-09-29: qty 2
  Filled 2013-09-29 (×21): qty 1.5

## 2013-09-29 MED ORDER — SODIUM CHLORIDE 0.9 % IV SOLN
INTRAVENOUS | Status: DC
  Administered 2013-09-29 – 2013-09-30 (×2): via INTRAVENOUS

## 2013-09-29 MED ORDER — ACETAMINOPHEN 325 MG PO TABS
650.0000 mg | ORAL_TABLET | Freq: Once | ORAL | Status: AC
  Administered 2013-09-29: 650 mg via ORAL
  Filled 2013-09-29: qty 2

## 2013-09-29 MED ORDER — PRAMIPEXOLE DIHYDROCHLORIDE 1.5 MG PO TABS
1.5000 mg | ORAL_TABLET | Freq: Three times a day (TID) | ORAL | Status: DC
  Administered 2013-09-29 – 2013-10-05 (×18): 1.5 mg via ORAL
  Filled 2013-09-29 (×19): qty 1

## 2013-09-29 MED ORDER — SODIUM CHLORIDE 0.9 % IV SOLN
Freq: Once | INTRAVENOUS | Status: AC
  Administered 2013-09-29: 15:00:00 via INTRAVENOUS

## 2013-09-29 MED ORDER — FUROSEMIDE 10 MG/ML IJ SOLN: 20.0000 mg | Freq: Once | INTRAMUSCULAR | Status: DC

## 2013-09-29 MED ORDER — ACETAMINOPHEN 650 MG RE SUPP: 650.0000 mg | Freq: Four times a day (QID) | RECTAL | Status: DC | PRN

## 2013-09-29 MED ORDER — PANTOPRAZOLE SODIUM 40 MG PO TBEC: 40.0000 mg | DELAYED_RELEASE_TABLET | Freq: Two times a day (BID) | ORAL | Status: DC

## 2013-09-29 MED ORDER — FUROSEMIDE 20 MG PO TABS
20.0000 mg | ORAL_TABLET | Freq: Two times a day (BID) | ORAL | Status: DC
  Administered 2013-09-29 – 2013-10-01 (×4): 20 mg via ORAL
  Filled 2013-09-29 (×6): qty 1

## 2013-09-29 MED ORDER — ONDANSETRON HCL 4 MG/2ML IJ SOLN
4.0000 mg | Freq: Four times a day (QID) | INTRAMUSCULAR | Status: DC | PRN
  Administered 2013-09-30 – 2013-10-03 (×3): 4 mg via INTRAVENOUS
  Filled 2013-09-29 (×2): qty 2

## 2013-09-29 MED ORDER — ACETAMINOPHEN 325 MG PO TABS
650.0000 mg | ORAL_TABLET | Freq: Four times a day (QID) | ORAL | Status: DC | PRN
  Administered 2013-10-04: 650 mg via ORAL
  Filled 2013-09-29: qty 2

## 2013-09-29 NOTE — ED Notes (Signed)
According to EMS, the patient advised them that her dark stools began on Sunday and have not stopped.  The patient called EMS today because she went to her PCP today and they advised her that her iron was low and she need to come to the ED to be evaluated.  EMS transported her to be evaluated.

## 2013-09-29 NOTE — Progress Notes (Signed)
Pt. Alert and orineted; vss; admitted to room 3s01.  Report received from [place], [place].  Call light within reach; bp systolic 58'P as was in ED.  Will continue to monitor.  Braham Cellar RN

## 2013-09-29 NOTE — H&P (Signed)
PATIENT DETAILS Name: [name] Age: 73 y.o. Sex: female Date of Birth: 1941-06-03 Admit Date: 09/29/2013 XQJ:JHER, [name], DO  CHIEF COMPLAINT:  Melena for the past 3 days  HPI: [name] is a 73 y.o. female with a Past Medical History of prior GI bleed secondary to a gastric ulcer in 7408, diastolic heart failure, cor pulmonale, hypertension and he who presents today with the above noted complaint. Per patient, previous back she started noticing black dark stools. She had more than 10 episodes of black tarry stools the day before. Yesterday she don't have any melanotic stools. Today she has had approximately 2 bowel movements are black. She went to see her PCP today, with a hemoglobin and was found to be 5, and promptly referred her to the emergency room. I was subsequently asked to admit this patient for further evaluation and treatment. Patient denies hematemesis and abdominal pain. Denies any nausea, vomiting, diarrhea, dysuria.   ALLERGIES:   Allergies  Allergen Reactions  . Codeine Other (See Comments)    unknown  . Penicillins Other (See Comments)    unknown    PAST MEDICAL HISTORY: Past Medical History  Diagnosis Date  . Parkinson's disease   . Hypertension   . Arthritis   . Anemia   . Cellulitis of lower leg 12/24/2011  . Acute bronchitis   . Other abnormal blood chemistry   . Chronic systolic heart failure   . Acute on chronic systolic heart failure   . Cellulitis and abscess of leg, except foot   . Hypotension, unspecified   . Helicobacter pylori (H. pylori)   . Acute gastric ulcer with hemorrhage, without mention of obstruction     PAST SURGICAL HISTORY: Past Surgical History  Procedure Laterality Date  . Hip arthroscopy w/ labral repair    . Thyroidectomy    . Esophagogastroduodenoscopy  09/26/2011    Procedure: ESOPHAGOGASTRODUODENOSCOPY (EGD);  Surgeon: Zenovia Jarred, MD;  Location: [facility] ENDOSCOPY;  Service: Gastroenterology;  Laterality: N/A;  To  be done at bedside.    MEDICATIONS AT HOME: Prior to Admission medications   Medication Sig Start Date End Date Taking? Authorizing Provider  albuterol (PROVENTIL) (2.5 MG/3ML) 0.083% nebulizer solution Take 2.5 mg by nebulization every 6 (six) hours as needed. For shortness of breath   Yes Historical Provider, MD  b complex vitamins tablet Take 1 tablet by mouth daily.     Yes Historical Provider, MD  carbidopa-levodopa (SINEMET CR) 50-200 MG per tablet Take 1 tablet by mouth at bedtime. 10pm 06/17/13  Yes Marcial Pacas, MD  carbidopa-levodopa (SINEMET IR) 25-100 MG per tablet Take 1.5 tablets by mouth 4 (four) times daily. 6am, 10am, 2pm and 6pm 05/21/13  Yes Marcial Pacas, MD  furosemide (LASIX) 20 MG tablet Take 20 mg by mouth 2 (two) times daily. Sometimes she takes once daily, sometimes twice daily 08/02/13  Yes [name] L Reed, DO  magnesium oxide (MAG-OX) 400 MG tablet Take 400 mg by mouth daily.    Yes Historical Provider, MD  pantoprazole (PROTONIX) 40 MG tablet Take 1 tablet (40 mg total) by mouth 2 (two) times daily. 09/29/13  Yes Mahima Pandey, MD  pramipexole (MIRAPEX) 1.5 MG tablet Take 1 tablet (1.5 mg total) by mouth 3 (three) times daily. 07/28/13  Yes Marcial Pacas, MD  triamcinolone cream (KENALOG) 0.1 % Apply 1 application topically daily. Applied to legs 07/15/13  Yes Historical Provider, MD    FAMILY HISTORY: Family History  Problem Relation Age of Onset  .  GER disease Mother   . Heart disease Father     SOCIAL HISTORY:  reports that she has never smoked. She has never used smokeless tobacco. She reports that she does not drink alcohol or use illicit drugs.  REVIEW OF SYSTEMS:  Constitutional:   No  weight loss, night sweats,  Fevers, chills, fatigue.  HEENT:    No headaches, Difficulty swallowing,Tooth/dental problems,Sore throat,  No sneezing, itching, ear ache, nasal congestion, post nasal drip,   Cardio-vascular: No chest pain,  Orthopnea, PND, swelling in lower  extremities, anasarca, dizziness, palpitations  GI:  No heartburn, indigestion, abdominal pain, nausea, vomiting, diarrhea, change in  bowel habits, loss of appetite  Resp: No shortness of breath with exertion .  No excess mucus, no productive cough, No non-productive cough,  No coughing up of blood.No change in color of mucus.No wheezing.No chest wall deformity  Skin:  no rash or lesions.  GU:  no dysuria, change in color of urine, no urgency or frequency.  No flank pain.  Musculoskeletal: No joint pain or swelling.  No decreased range of motion.  No back pain.  Psych: No change in mood or affect. No depression or anxiety.  No memory loss.   PHYSICAL EXAM: Blood pressure 100/48, pulse 95, temperature 97.9 F (36.6 C), temperature source Oral, resp. rate 22, SpO2 100.00%.  General appearance :Awake, alert, not in any distress. Speech Clear. Not toxic Looking- looks very pale. HEENT: Atraumatic and Normocephalic, pupils equally reactive to light and accomodation Neck: supple, no JVD. No cervical lymphadenopathy.  Chest:Good air entry bilaterally, no added sounds  CVS: S1 S2 regular, no murmurs.  Abdomen: Bowel sounds present, Non tender and not distended with no gaurding, rigidity or rebound. Extremities: B/L Lower Ext shows 1+ edema, both legs are warm to touch. There is chronic bilateral lower extremity erythema. Neurology: Awake alert, and oriented X 3, CN II-XII intact, Non focal Skin:No Rash Wounds:N/A  LABS ON ADMISSION:   Recent Labs  09/29/13 0923 09/29/13 1443  NA 140 143  K 4.4 4.7  CL 104 107  CO2 21 23  GLUCOSE 112* 94  BUN 40* 43*  CREATININE 0.99 0.83  CALCIUM 8.8 8.2*    Recent Labs  09/29/13 0923  AST 13  ALT 5  ALKPHOS 52  BILITOT 0.3  PROT 4.9*   No results found for this basename: LIPASE, AMYLASE,  in the last 72 hours  Recent Labs  09/29/13 0923 09/29/13 1443  WBC 13.0* 12.2*  NEUTROABS 9.5* 10.2*  HGB 5.8* 5.5*  HCT 18.4* 17.2*   MCV 78* 78.2  PLT  --  142*   No results found for this basename: CKTOTAL, CKMB, CKMBINDEX, TROPONINI,  in the last 72 hours No results found for this basename: DDIMER,  in the last 72 hours No components found with this basename: POCBNP,    RADIOLOGIC STUDIES ON ADMISSION: No results found.   EKG: Independently reviewed. Sinus tachycardia  ASSESSMENT AND PLAN: Present on Admission:  . UGI bleed - Admit to step down unit, start PPI. Transfuse 3 units of PRBC. Keep n.p.o. till seen by gastroenterology.  - Check CBC every 6 hours for now.  . Anemia due to blood loss, acute - Secondary to above  - We'll transfuse 3 units of PRBC, check CBC every 6 hours. Transfuse as needed.   . Diastolic dysfunction, grade 2 - Watch for fluid overload.continue Lasix cautiously.   Marland Kitchen HTN (hypertension) - Continue Lasix cautiously. Hold if develops perfuse bleeding  or if blood pressure is more soft.   . Parkinson's disease - Currently stable, continue Sinemet.  Further plan will depend as patient's clinical course evolves and further radiologic and laboratory data become available. Patient will be monitored closely.  Above noted plan was discussed with patient, she was in agreement.   DVT Prophylaxis: SCD's  Code Status: Full Code  Total time spent for admission equals 45 minutes.  [place] [facility] Pager [phone]  If 7PM-7AM, please contact night-coverage [email or web address] Password [facility] 09/29/2013, 4:47 PM

## 2013-09-29 NOTE — Progress Notes (Signed)
Patient ID: [name], female   DOB: 1941/09/01, 73 y.o.   MRN: [id number]     Chief Complaint  Patient presents with  . Rectal Bleeding    x 2 day patient with blood in stool with BM    Allergies  Allergen Reactions  . Codeine Other (See Comments)    unknown  . Penicillins Other (See Comments)    unknown    HPI 73 y/o female patient is here for acute visit She has been having blood in stool with each bowel movement x 2 days. It is frank blood. This has never happened before. She denies history of hemorrhoids She has hx of constipation She denies any pain in rectal area with defecation She does have slight discomfort in her abdomen after defecation She does have hx of upper gi bleed and stomach ulcer diagnosed in 2011 She is not on any PPI at present Denies using any NSAIDS or aspirin  ROS Denies any vomiting but has slight nausea Denies diarrhea Denies chest pain or dyspnea Denies dizziness or lightheadedness Has history of iron deficiency anemia No history of colonoscopy in the past Last seen GI DR Collene Mares several years back  Past Medical History  Diagnosis Date  . Parkinson's disease   . Hypertension   . Arthritis   . Anemia   . Cellulitis of lower leg 12/24/2011  . Acute bronchitis   . Other abnormal blood chemistry   . Chronic systolic heart failure   . Acute on chronic systolic heart failure   . Cellulitis and abscess of leg, except foot   . Hypotension, unspecified   . Helicobacter pylori (H. pylori)   . Acute gastric ulcer with hemorrhage, without mention of obstruction     Past Surgical History  Procedure Laterality Date  . Hip arthroscopy w/ labral repair    . Thyroidectomy    . Esophagogastroduodenoscopy  09/26/2011    Procedure: ESOPHAGOGASTRODUODENOSCOPY (EGD);  Surgeon: Zenovia Jarred, MD;  Location: [facility] ENDOSCOPY;  Service: Gastroenterology;  Laterality: N/A;  To be done at bedside.    Medication reviewed. See [facility]  Physical exam BP 126/78  Pulse  54  Temp(Src) 97.1 F (36.2 C) (Oral)  Wt 180 lb (81.647 kg)  SpO2 95%  Orthostatic vitals Lying down- 110/66, heart rate 105/min Sitting- bp 120/70, hr 85/min Standing- 120/78, hr 90/min  General- elderly female in no acute distress Head- atraumatic, normocephalic Eyes- PERRLA, EOMI, no pallor, no icterus, no discharge Neck- no lymphadenopathy Cardiovascular- normal s1,s2, no murmurs/ rubs/ gallops Respiratory- bilateral clear to auscultation, no wheeze, no rhonchi, no crackles Abdomen- bowel sounds present, soft, non tender,  Rectal exam- no external hemorrhoids, guiac stool grossly positive, on anoscopy rectum full of fecal matter Musculoskeletal- using a rollator walker Neurological- no focal deficit Psychiatry- alert and oriented to person, place and time, normal mood and affect   Labs- CBC    Component Value Date/Time   WBC 8.7 07/30/2013 1004   WBC 10.2 04/09/2013 0000   RBC 4.57 07/30/2013 1004   RBC 4.80 04/09/2013 0000   HGB 11.1* 07/30/2013 1004   HCT 34.7* 07/30/2013 1004   PLT 144* 07/30/2013 1004   MCV 75.9* 07/30/2013 1004   MCH 24.3* 07/30/2013 1004   MCH 21.7* 04/09/2013 0000   MCHC 32.0 07/30/2013 1004   MCHC 30.1* 04/09/2013 0000   RDW 19.1* 07/30/2013 1004   RDW 19.2* 04/09/2013 0000   LYMPHSABS 1.0 07/30/2013 1004   LYMPHSABS 0.9 04/09/2013 0000   MONOABS 0.6  07/30/2013 1004   EOSABS 0.1 07/30/2013 1004   EOSABS 0.1 04/09/2013 0000   BASOSABS 0.0 07/30/2013 1004   BASOSABS 0.1 04/09/2013 0000    CMP     Component Value Date/Time   NA 137 07/30/2013 1004   NA 140 04/09/2013 0000   K 4.6 07/30/2013 1004   CL 104 07/30/2013 1004   CO2 25 07/30/2013 1004   GLUCOSE 97 07/30/2013 1004   GLUCOSE 91 04/09/2013 0000   BUN 13 07/30/2013 1004   BUN 17 04/09/2013 0000   CREATININE 0.63 07/30/2013 1004   CALCIUM 8.9 07/30/2013 1004   PROT 6.3 07/30/2013 1004   PROT 6.5 04/09/2013 0000   ALBUMIN 3.1* 07/30/2013 1004   AST 21 07/30/2013 1004   ALT <5 07/30/2013 1004   ALKPHOS  82 07/30/2013 1004   BILITOT 0.5 07/30/2013 1004   GFRNONAA 87* 07/30/2013 1004   GFRAA >90 07/30/2013 1004   Assessment/plan  1. Rectal bleeding Possible upper gi bleed given hx of gi bleed in past. She could be having another bleeding ulcer or gastric erosion. Will send stat cbc. Negative for orthostatic. Will have her started on pantoprazole 40 mg bid for now. Have made GI referral for 09/30/12 to assess for need for EGD and colonoscopy. Explained pt about warning signs like vomiting blood, abdominal pain, extreme weakness that would require her to go to the ED - CBC with Differential - CMP - Ambulatory referral to Gastroenterology  2. Anemia, iron deficiency Concern for active GI bleed at present. Stat CBC for now. Pt might benefit from iron supplement in long run given her hx of gi bleed in past - CBC with Differential

## 2013-09-29 NOTE — ED Notes (Signed)
Pt bed adjusted.  No complaints

## 2013-09-29 NOTE — Telephone Encounter (Signed)
CMA called patient and asked to call EMS and go to the ED for GI bleed  I called the [name], talked with nursing supervisor Valetta Fuller. Physician not available at present as attending other patient as per [facility]. Provided name, DOB and detail on patient situation. Patient will need transfusion initiated and admission with GI consult for possible EGD and colonoscopy.

## 2013-09-29 NOTE — ED Notes (Signed)
NOTIFIED DR. NANAVATI OF PATIENTS LAB RESULTS ,@14 :36 PM , 09/29/2013.

## 2013-09-29 NOTE — Consult Note (Signed)
[facility] Consult: 3:50 PM 09/29/2013  LOS: 0 days    Referring Provider: Dr Kathrynn Humble Primary Care Physician:  Hollace Kinnier, DO Primary Gastroenterologist:  Dr. Hilarie Fredrickson    Reason for Consultation:  Melena, anemia.     HPI: [name] is a 73 y.o. female.  Has Parkinson's disease. Hx of bleeding gastritic ulcer and erosive esophagitis in 09/2011. Transfused with 2 PRBCs during admission.  Discharged on PPI and Carafate as well as po Iron.  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  However biopsy of z line was benign.    Home meds list BID Protonix 40 mg, however she says she has not taken this for at least one year. No ASA or NSAIDs  In ED today with 3 days of dark stool. She has felt weak and a bit dizzy.  No syncopal spell.  + DOE.  No nausea, no anorexia.  + early satiety Hgb is 5.3, compared to 10 to 11 in July thru November 2014.  Stools normally every 2 to 3 days and sometimes requires laxative.  sinc stools turned dark, they are loose, but not watery and occurring 2 or so times daily.    Past Medical History  Diagnosis Date  . Parkinson's disease   . Hypertension   . Arthritis   . Anemia   . Cellulitis of lower leg 12/24/2011  . Acute bronchitis   . Other abnormal blood chemistry   . Chronic systolic heart failure   . Acute on chronic systolic heart failure   . Cellulitis and abscess of leg, except foot   . Hypotension, unspecified   . Helicobacter pylori (H. pylori)   . Acute gastric ulcer with hemorrhage, without mention of obstruction     Past Surgical History  Procedure Laterality Date  . Hip arthroscopy w/ labral repair    . Thyroidectomy    . Esophagogastroduodenoscopy  09/26/2011    Procedure: ESOPHAGOGASTRODUODENOSCOPY  (EGD);  Surgeon: Zenovia Jarred, MD;  Location: [facility] ENDOSCOPY;  Service: Gastroenterology;  Laterality: N/A;  To be done at bedside.    Prior to Admission medications   Medication Sig Start Date End Date Taking? Authorizing Provider  carbidopa-levodopa (SINEMET CR) 50-200 MG per tablet Take 1 tablet by mouth at bedtime. 10pm 06/17/13  Yes Marcial Pacas, MD  carbidopa-levodopa (SINEMET IR) 25-100 MG per tablet Take 1.5 tablets by mouth 4 (four) times daily. 6am, 10am, 2pm and 6pm 05/21/13  Yes Marcial Pacas, MD  albuterol (PROVENTIL) (2.5 MG/3ML) 0.083% nebulizer solution Take 2.5 mg by nebulization every 6 (six) hours as needed. For shortness of breath    Historical Provider, MD  b complex vitamins tablet Take 1 tablet by mouth daily.      Historical Provider, MD  furosemide (LASIX) 20 MG tablet Take 2 tablets (40 mg total) by mouth 2 (two) times daily. 08/02/13   [name] L Reed, DO  magnesium oxide (MAG-OX) 400 MG tablet Take 400 mg by mouth daily.     Historical  Provider, MD  pantoprazole (PROTONIX) 40 MG tablet Take 1 tablet (40 mg total) by mouth 2 (two) times daily. 09/29/13   Blanchie Serve, MD  potassium chloride SA (K-DUR,KLOR-CON) 20 MEQ tablet Take 20 mEq by mouth every other day.     Historical Provider, MD  pramipexole (MIRAPEX) 1.5 MG tablet Take 1 tablet (1.5 mg total) by mouth 3 (three) times daily. 07/28/13   Marcial Pacas, MD  triamcinolone cream (KENALOG) 0.1 % Apply 1 application topically daily. Applied to legs 07/15/13   Historical Provider, MD    Scheduled Meds: . ipratropium-albuterol  3 mL Nebulization Q4H   Infusions: . pantoprazole (PROTONIX) IV    . pantoprozole (PROTONIX) infusion     PRN Meds:    Allergies as of 09/29/2013 - Review Complete 09/29/2013  Allergen Reaction Noted  . Codeine Other (See Comments) 09/25/2011  . Penicillins Other (See Comments) 09/25/2011    Family History  Problem Relation Age of Onset  . GER disease Mother   . Heart disease Father     History     Social History  . Marital Status: Single    Spouse Name: N/A    Number of Children: 0  . Years of Education: 12   Occupational History  .      Retired   Social History Main Topics  . Smoking status: Never Smoker   . Smokeless tobacco: Never Used  . Alcohol Use: No  . Drug Use: No  . Sexual Activity: No   Other Topics Concern  . Not on file   Social History Narrative   Patient lives at home alone. Patient is retired. Patient has high school education.   Caffeine- one daily.   Right handed.    REVIEW OF SYSTEMS: Constitutional:  Weight stable within 5 # ENT:  No nose bleeds Pulm:  No cough.  +DOE CV:  No palpitations, no LE edema.  GU:  No hematuria, no frequency GI:  Per HPI Heme:  Per HPI   Transfusions:  Per HPI MS:  Fell in early 08/2013, lost her balance. Neuro:  No headaches, no peripheral tingling or numbness Derm:  No itching, no rash or sores.  Endocrine:  No sweats or chills.  No polyuria or dysuria Immunization:  09/14/13 flu shot.  Travel:  None beyond local counties in last few months.    PHYSICAL EXAM: Vital signs in last 24 hours: Filed Vitals:   09/29/13 1414  BP: 100/58  Pulse: 100  Resp: 24   Wt Readings from Last 3 Encounters:  09/29/13 81.647 kg (180 lb)  08/02/13 82.283 kg (181 lb 6.4 oz)  05/25/13 78.472 kg (173 lb)    General: pale, somewhat ill-looking elderly WF who is comfortable and alert Head:  No asymmetry or trauma  Eyes:  No icterus or pallor Ears: + HOH  Nose:  No discharge or bleeding Mouth:  Clear, moist oral MM. Neck:  No JVD.  No mass, no bruits Lungs:  Clear bil.  No cough or dyspnea Heart: RRR.  No MRG Abdomen:  Soft, NT, ND.  No mass or HSM.  Small, non-tender umbilical hernia. .   Rectal: deferred.  Stool dark, FOBT + per ED MD   Musc/Skeltl: no joint swelling. 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 Extremities:  No pedal edema  Neurologic:  Oriented x 3.  + pill rolling tremor and LE  tremor. Skin:  Telangectasia on upper trunk. Tattoos:  none Nodes:  No cervical adenopathy.   Psych:  Pleasant, cooperative, relaxed.   Intake/Output from previous day:   Intake/Output this shift:    LAB RESULTS:  Recent Labs  09/29/13 0923 09/29/13 1443  WBC 13.0* 12.2*  HGB 5.8* 5.5*  HCT 18.4* 17.2*  PLT  --  142*   BMET Lab Results  Component Value Date   NA 140 09/29/2013   NA 137 07/30/2013   NA 140 04/09/2013   K 4.4 09/29/2013   K 4.6 07/30/2013   K 4.7 04/09/2013   CL 104 09/29/2013   CL 104 07/30/2013   CL 102 04/09/2013   CO2 21 09/29/2013   CO2 25 07/30/2013   CO2 25 04/09/2013   GLUCOSE 112* 09/29/2013   GLUCOSE 97 07/30/2013   GLUCOSE 91 04/09/2013   BUN 40* 09/29/2013   BUN 13 07/30/2013   BUN 17 04/09/2013   CREATININE 0.99 09/29/2013   CREATININE 0.63 07/30/2013   CREATININE 0.88 04/09/2013   CALCIUM 8.8 09/29/2013   CALCIUM 8.9 07/30/2013   CALCIUM 9.3 04/09/2013   LFT  Recent Labs  09/29/13 0923  PROT 4.9*  AST 13  ALT 5  ALKPHOS 52  BILITOT 0.3   PT/INR None  RADIOLOGY STUDIES: No results found.  ENDOSCOPIC STUDIES: 12/2011  EGD  Dr Hilarie Fredrickson follow-up of gastric ulcer, history of esophagitis DESCRIPTION OF PROCEDURE: After the risks benefits and  alternatives of the procedure were thoroughly explained, informed  consent was obtained. The LB GIF-H180 [id number] endoscope was  introduced through the mouth and advanced to the second portion of  the duodenum, without limitations. The instrument was slowly  withdrawn as the mucosa was fully examined.  <<PROCEDUREIMAGES>>  Irregular Z-line at the gastroesophageal junction 33 cm from the  incisors. Otherwise normal esophagus with healing of previously  seen esophagitis. A hiatal hernia was found. Cameron's lesions  in the cardia. A healed ulcer was noted in the angulus. Mild  gastritis was found. Biopsies of the antrum and body of the  stomach were obtained and sent to pathology. The duodenal bulb  was normal  in appearance, as was the postbulbar duodenum.  Retroflexed views revealed Retroflexion exam demonstrated findings as  previously described. The scope was then withdrawn from the  patient and the procedure completed.  COMPLICATIONS: None  ENDOSCOPIC IMPRESSION:  1) Irregular Z-line at the gastroesophageal junction. Healed  esophagitis.  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  5) Ulcer, healed. Scar seen on the lesser curve of the stomach  6) Mild gastritis. Multiple biopsies taken and sent to  pathology.  7) Normal duodenum  8) Retroflexion exam demonstrated findings as previously  described.  RECOMMENDATIONS:  1) Await pathology results  2) Continue taking your PPI (antiacid medicine, called  pantoprazole) once daily. It is best to be taken 20-30 minutes  prior to breakfast meal.  3) Your primary care doctor can continue to monitor your blood  counts and iron stores. Oral iron should continue  until iron stores replete. Pathology GASTRIC BODY AND ANTRAL TYPE MUCOSA WITH ASSOCIATED MILD CHRONIC INFLAMMATION, NO EVIDENCE OF HELICOBACTER PYLORI, INTESTINAL METAPLASIA, DYSPLASIA OR MALIGNANCY.   09/2011 EGD  Dr Hilarie Fredrickson For hematemesis, melena DESCRIPTION OF PROCEDURE: After the risks benefits and  alternatives of the procedure were thoroughly explained, informed  consent was obtained. The Pentax Gastroscope [id number] endoscope  was introduced through the mouth and advanced to the second  portion of the duodenum, without limitations. The instrument was  slowly withdrawn as the mucosa was fully examined.  <<PROCEDUREIMAGES>>  Severe, LA Grade D, erosive esophagitis was found in the middle  and distal esophagus. There was an ulcer, not bleeding and  superficial in the distal esophagus.. An ulcer was found on the  lesser curve in the proximal gastric body. There was a visible  vessel and injection was  performed with epinephrine 1:10,000 (4  total cc), then 2 hemostatic clips were successfully placed. No  bleeding at the end of the procedure. A large hiatal hernia was  found. The duodenal bulb was normal in appearance, as was the  postbulbar duodenum. Retroflexed views revealed findings as  previously described. The scope was then withdrawn from the  patient and the procedure completed.  ENDOSCOPIC IMPRESSION:  1) Severe likely reflux erosive esophagitis  2) Ulcer on lesser curvature of the stomach, felt to be acute  bleeding source. Successfully injected with epinephrine and  clipped with 2 hemostatic clips.  3) Hiatal hernia  4) Normal duodenum  RECOMMENDATIONS:  1) Continue PPI infusion for 72 hours, then switch to po BID PPI  for at least 3 months.  2) Follow Hgb closely and transfuse as needed to maintain Hgb >  8  3) Check H. Pylori serum Ab and treat if positive  4) Avoid NSAIDs  5) Repeat EGD in 12 weeks to ensure healing of esophagitis and  ulcer.      IMPRESSION:   *  GI bleed Hx of GI bleed 09/2011.  EGD then with clipping of gastric ulcer and severe esophagitis.  Follow up EGD 12/2011 with healed esophagitis and gastric ulcer, cameron lesions in hiatal hernia.  Irregular z line with non-worrisome pathology.  Has not been taking PPI for over one year per her recall so she may have recurrent ulcer.     PLAN:     *  Transfuse blood, first of 3 units has been started.  *  EGD tomorrow. Orders in depot.  Sips of clears ok tonight. IV Protonix drip in place. Azucena Freed  09/29/2013, 3:50 PM Pager: [phone]  ________________________________________________________________________  Velora Heckler GI MD note:  I personally examined the patient, reviewed the data and agree with the assessment and plan described above.  Planning on EGD later today.   Owens Loffler, MD [facility] Pager [phone]

## 2013-09-29 NOTE — ED Provider Notes (Signed)
CSN: [id number]     Arrival date & time 09/29/13  1406 History   First MD Initiated Contact with Patient 09/29/13 1412     Chief Complaint  Patient presents with  . GI Bleeding   (Consider location/radiation/quality/duration/timing/severity/associated sxs/prior Treatment) HPI Comments: 73 y/o female comes in with cc of bloody stools. Hx of gastric ulcers, HTN - not on ASA or anticoagulants. She reports having dark stools since Saturday or Sunday. Saw her PCP today, and was sent to the ED after he Hb was noted to be low. Pt has no n/v/f/c. Pt has mild lower quadrant abd pain. She admits to dib, weakness, dizziness with exertion.  The history is provided by the patient.    Past Medical History  Diagnosis Date  . Parkinson's disease   . Hypertension   . Arthritis   . Anemia   . Cellulitis of lower leg 12/24/2011  . Acute bronchitis   . Other abnormal blood chemistry   . Chronic systolic heart failure   . Acute on chronic systolic heart failure   . Cellulitis and abscess of leg, except foot   . Hypotension, unspecified   . Helicobacter pylori (H. pylori)   . Acute gastric ulcer with hemorrhage, without mention of obstruction    Past Surgical History  Procedure Laterality Date  . Hip arthroscopy w/ labral repair    . Thyroidectomy    . Esophagogastroduodenoscopy  09/26/2011    Procedure: ESOPHAGOGASTRODUODENOSCOPY (EGD);  Surgeon: Zenovia Jarred, MD;  Location: [facility] ENDOSCOPY;  Service: Gastroenterology;  Laterality: N/A;  To be done at bedside.   Family History  Problem Relation Age of Onset  . GER disease Mother   . Heart disease Father    History  Substance Use Topics  . Smoking status: Never Smoker   . Smokeless tobacco: Never Used  . Alcohol Use: No   OB History   Grav Para Term Preterm Abortions TAB SAB Ect Mult Living                 Review of Systems  Constitutional: Positive for activity change and fatigue. Negative for diaphoresis.  HENT: Negative for facial swelling.    Respiratory: Negative for cough, shortness of breath and wheezing.   Cardiovascular: Negative for chest pain.  Gastrointestinal: Positive for anal bleeding. Negative for nausea, vomiting, abdominal pain, diarrhea, constipation, blood in stool and abdominal distention.  Genitourinary: Negative for hematuria and difficulty urinating.  Musculoskeletal: Negative for neck pain.  Skin: Negative for color change.  Neurological: Positive for weakness. Negative for syncope and speech difficulty.  Hematological: Does not bruise/bleed easily.  Psychiatric/Behavioral: Negative for confusion.    Allergies  Codeine and Penicillins  Home Medications   Current Outpatient Rx  Name  Route  Sig  Dispense  Refill  . carbidopa-levodopa (SINEMET CR) 50-200 MG per tablet   Oral   Take 1 tablet by mouth at bedtime. 10pm   30 tablet   12   . carbidopa-levodopa (SINEMET IR) 25-100 MG per tablet   Oral   Take 1.5 tablets by mouth 4 (four) times daily. 6am, 10am, 2pm and 6pm   540 tablet   1   . albuterol (PROVENTIL) (2.5 MG/3ML) 0.083% nebulizer solution   Nebulization   Take 2.5 mg by nebulization every 6 (six) hours as needed. For shortness of breath         . b complex vitamins tablet   Oral   Take 1 tablet by mouth daily.           Marland Kitchen  furosemide (LASIX) 20 MG tablet   Oral   Take 2 tablets (40 mg total) by mouth 2 (two) times daily.   60 tablet   3   . magnesium oxide (MAG-OX) 400 MG tablet   Oral   Take 400 mg by mouth daily.          . pantoprazole (PROTONIX) 40 MG tablet   Oral   Take 1 tablet (40 mg total) by mouth 2 (two) times daily.   60 tablet   3   . potassium chloride SA (K-DUR,KLOR-CON) 20 MEQ tablet   Oral   Take 20 mEq by mouth every other day.          . pramipexole (MIRAPEX) 1.5 MG tablet   Oral   Take 1 tablet (1.5 mg total) by mouth 3 (three) times daily.   270 tablet   3   . triamcinolone cream (KENALOG) 0.1 %   Topical   Apply 1 application  topically daily. Applied to legs          BP 100/58  Pulse 100  Resp 24  SpO2 93% Physical Exam  Nursing note and vitals reviewed. Constitutional: She is oriented to person, place, and time. She appears well-developed and well-nourished.  HENT:  Head: Normocephalic and atraumatic.  Eyes: EOM are normal. Pupils are equal, round, and reactive to light.  Neck: Neck supple.  Cardiovascular: Normal rate, regular rhythm and normal heart sounds.   No murmur heard. Pulmonary/Chest: Effort normal. No respiratory distress.  Abdominal: Soft. She exhibits no distension. There is no tenderness. There is no rebound and no guarding.  Melanotic stools  Neurological: She is alert and oriented to person, place, and time.  Skin: Skin is warm and dry.    ED Course  Procedures (including critical care time) Labs Review Labs Reviewed  OCCULT BLOOD, POC DEVICE - Abnormal; Notable for the following:    Fecal Occult Bld POSITIVE (*)    All other components within normal limits  URINALYSIS, ROUTINE W REFLEX MICROSCOPIC  CBC WITH DIFFERENTIAL  BASIC METABOLIC PANEL  APTT  PROTIME-INR  TYPE AND SCREEN   Imaging Review No results found.  EKG Interpretation   None       MDM  No diagnosis found.  Pt comes in with cc of bloody stools. She is tachycardic, and having some weakness, dib and dizziness. Pt is not on any anticoagulants, or antiplatelet agents, and has melana on exam. Hb is 5.2  Spoke with Dr. Judson Roch - GI Velora Heckler - she will see the patient. Will admit, keep her npo.  Pt will be admitted for GI bleed.  We will start transfusion for symptomatic anemia - and start protonix drip as there is known hx of ulcers.   CRITICAL CARE Performed by: Varney Biles   Total critical care time: 45 minutes  Critical care time was exclusive of separately billable procedures and treating other patients.  Critical care was necessary to treat or prevent imminent or life-threatening  deterioration.  Critical care was time spent personally by me on the following activities: development of treatment plan with patient and/or surrogate as well as nursing, discussions with consultants, evaluation of patient's response to treatment, examination of patient, obtaining history from patient or surrogate, ordering and performing treatments and interventions, ordering and review of laboratory studies, ordering and review of radiographic studies, pulse oximetry and re-evaluation of patient's condition.   Varney Biles, MD 09/29/13 1555

## 2013-09-29 NOTE — ED Notes (Signed)
CRITICAL VALUE ALERT  Critical value received:  Hemoglobin 5.5  Date of notification:  09/29/2013  Time of notification:  7353  Critical value read back:yes  Nurse who received alert:  MG Pedram Goodchild,RN  MD notified (1st page):  Dr. Kathrynn Humble  Time of first page:  1549  MD notified (2nd page):  Time of second page:  Responding MD:  Dr. Kathrynn Humble  Time MD responded:  [phone]

## 2013-09-29 NOTE — Progress Notes (Signed)
Patient ID: [name], female   DOB: 05/05/1941, 73 y.o.   MRN: [id number]  Stat cbc resulted back with hb 5.8 and hct 18.4.  Called patient and advised to call EMS and go to the Emergency department  Spoke with Valetta Fuller, nurse in the ED and updated about current patient situation and need for admission for blood transfusion and GI consult for EGD and colonoscopy.

## 2013-09-29 NOTE — ED Notes (Signed)
Noted most current BP readings to staff RN.

## 2013-09-30 ENCOUNTER — Encounter (HOSPITAL_COMMUNITY): Payer: Self-pay | Admitting: *Deleted

## 2013-09-30 ENCOUNTER — Ambulatory Visit: Payer: Medicare Other | Admitting: Physician Assistant

## 2013-09-30 ENCOUNTER — Encounter (HOSPITAL_COMMUNITY)
Admission: EM | Disposition: A | Discharge status: Rehabilitation Facility | Payer: Self-pay | Source: Home / Self Care | Attending: Internal Medicine

## 2013-09-30 DIAGNOSIS — K253 Acute gastric ulcer without hemorrhage or perforation: Secondary | ICD-10-CM

## 2013-09-30 DIAGNOSIS — D696 Thrombocytopenia, unspecified: Secondary | ICD-10-CM | POA: Diagnosis present

## 2013-09-30 HISTORY — PX: ESOPHAGOGASTRODUODENOSCOPY: SHX5428

## 2013-09-30 LAB — CBC
HCT: 22.9 % — ABNORMAL LOW (ref 36.0–46.0)
HCT: 23.3 % — ABNORMAL LOW (ref 36.0–46.0)
HCT: 22.8 % — ABNORMAL LOW (ref 36.0–46.0)
HEMOGLOBIN: 7.7 g/dL — AB (ref 12.0–15.0)
HEMOGLOBIN: 7.5 g/dL — AB (ref 12.0–15.0)
Hemoglobin: 7.5 g/dL — ABNORMAL LOW (ref 12.0–15.0)
MCH: 26.8 pg (ref 26.0–34.0)
MCH: 27.1 pg (ref 26.0–34.0)
MCH: 27.2 pg (ref 26.0–34.0)
MCHC: 32.8 g/dL (ref 30.0–36.0)
MCHC: 33 g/dL (ref 30.0–36.0)
MCHC: 32.9 g/dL (ref 30.0–36.0)
MCV: 81.8 fL (ref 78.0–100.0)
MCV: 82 fL (ref 78.0–100.0)
MCV: 82.6 fL (ref 78.0–100.0)
Platelets: 94 10*3/uL — ABNORMAL LOW (ref 150–400)
Platelets: 95 10*3/uL — ABNORMAL LOW (ref 150–400)
Platelets: 103 10*3/uL — ABNORMAL LOW (ref 150–400)
RBC: 2.8 MIL/uL — ABNORMAL LOW (ref 3.87–5.11)
RBC: 2.84 MIL/uL — ABNORMAL LOW (ref 3.87–5.11)
RBC: 2.76 MIL/uL — AB (ref 3.87–5.11)
RDW: 16.6 % — ABNORMAL HIGH (ref 11.5–15.5)
RDW: 17 % — ABNORMAL HIGH (ref 11.5–15.5)
RDW: 17.5 % — ABNORMAL HIGH (ref 11.5–15.5)
WBC: 8.3 10*3/uL (ref 4.0–10.5)
WBC: 9.9 10*3/uL (ref 4.0–10.5)
WBC: 10.4 10*3/uL (ref 4.0–10.5)

## 2013-09-30 LAB — COMPREHENSIVE METABOLIC PANEL
ALT: <5 U/L (ref 0–35)
AST: 12 U/L (ref 0–37)
Albumin: 2.3 g/dL — ABNORMAL LOW (ref 3.5–5.2)
Alkaline Phosphatase: 45 U/L (ref 39–117)
BUN: 27 mg/dL — ABNORMAL HIGH (ref 6–23)
CO2: 23 mEq/L (ref 19–32)
Calcium: 7.5 mg/dL — ABNORMAL LOW (ref 8.4–10.5)
Chloride: 108 mEq/L (ref 96–112)
Creatinine, Ser: 0.7 mg/dL (ref 0.50–1.10)
GFR calc Af Amer: >90 mL/min (ref >90)
GFR calc non Af Amer: 84 mL/min — ABNORMAL LOW (ref >90)
Glucose, Bld: 93 mg/dL (ref 70–99)
Potassium: 3.8 mEq/L (ref 3.7–5.3)
Sodium: 141 mEq/L (ref 137–147)
Total Bilirubin: 0.7 mg/dL (ref 0.3–1.2)
Total Protein: 4.4 g/dL — ABNORMAL LOW (ref 6.0–8.3)

## 2013-09-30 SURGERY — EGD (ESOPHAGOGASTRODUODENOSCOPY)
Anesthesia: Moderate Sedation

## 2013-09-30 MED ORDER — SODIUM CHLORIDE 0.9 % IJ SOLN
PREFILLED_SYRINGE | INTRAMUSCULAR | Status: DC | PRN
  Administered 2013-09-30: 13:00:00

## 2013-09-30 MED ORDER — MIDAZOLAM HCL 5 MG/ML IJ SOLN
INTRAMUSCULAR | Status: AC
Start: 2013-09-30 — End: 2013-09-30
  Filled 2013-09-30: qty 2

## 2013-09-30 MED ORDER — FENTANYL CITRATE 0.05 MG/ML IJ SOLN
INTRAMUSCULAR | Status: DC | PRN
  Administered 2013-09-30 (×3): 25 ug via INTRAVENOUS

## 2013-09-30 MED ORDER — BUTAMBEN-TETRACAINE-BENZOCAINE 2-2-14 % EX AERO
INHALATION_SPRAY | CUTANEOUS | Status: DC | PRN
  Administered 2013-09-30: 2 via TOPICAL

## 2013-09-30 MED ORDER — FENTANYL CITRATE 0.05 MG/ML IJ SOLN
INTRAMUSCULAR | Status: AC
  Filled 2013-09-30: qty 2

## 2013-09-30 MED ORDER — MIDAZOLAM HCL 10 MG/2ML IJ SOLN
INTRAMUSCULAR | Status: DC | PRN
  Administered 2013-09-30: 1 mg via INTRAVENOUS
  Administered 2013-09-30 (×2): 2 mg via INTRAVENOUS
  Administered 2013-09-30: 1 mg via INTRAVENOUS
  Administered 2013-09-30: 2 mg via INTRAVENOUS

## 2013-09-30 MED ORDER — ONDANSETRON HCL 4 MG/2ML IJ SOLN
INTRAMUSCULAR | Status: AC
  Filled 2013-09-30: qty 2

## 2013-09-30 MED ORDER — FUROSEMIDE 20 MG PO TABS: 20.0000 mg | ORAL_TABLET | Freq: Two times a day (BID) | ORAL | Status: DC

## 2013-09-30 MED ORDER — WHITE PETROLATUM GEL
Status: AC
  Administered 2013-09-30: 0.2
  Filled 2013-09-30: qty 5

## 2013-09-30 NOTE — Interval H&P Note (Signed)
History and Physical Interval Note:  09/30/2013 12:33 PM  [name]  has presented today for surgery, with the diagnosis of upper gi bleed, anemia  The various methods of treatment have been discussed with the patient and family. After consideration of risks, benefits and other options for treatment, the patient has consented to  Procedure(s): ESOPHAGOGASTRODUODENOSCOPY (EGD) (N/A) as a surgical intervention .  The patient's history has been reviewed, patient examined, no change in status, stable for surgery.  I have reviewed the patient's chart and labs.  Questions were answered to the patient's satisfaction.     [name]

## 2013-09-30 NOTE — Progress Notes (Signed)
Utilization review completed.  

## 2013-09-30 NOTE — Progress Notes (Signed)
[facility] Progress Note [name] TEAM 1 - Stepdown/ICU TEAM   [name] DOB: 1941/07/30 DOA: 09/29/2013 PCP: Hollace Kinnier, DO  Brief narrative: [name] is a 73 y.o. female with a Past Medical History of prior GI bleed secondary to a gastric ulcer in 0932, diastolic heart failure, cor pulmonale, hypertension presented with black stools for past 4-5 days..  She went to see her PCP and was found to be 5, and promptly referred her to the emergency room.   Subjective: Feels weak. Last BM about 24 hrs ago. No significant epigastric pain.    Assessment/Plan: Principal Problem:   Anemia due to blood loss, acute - s/p 3 U blood transfusion - follow H/ H - transfuse if Hgb < 7  Active Problems:    UGI bleed - s/p EGD revealing gastric ulcer/ possible cameron's ulcer - cont PPI for 36-48 hrs per GI - Repeat EGD in 8 wks. ( by Dr Hilarie Fredrickson)     Thrombocytopenia, unspecified - possible due to consumption - follow     Parkinson's disease  Cont  Sinemet    Diastolic dysfunction, grade 2 - IVF discontinued today due to crackles at bases - resume home dose lasix for now   Code Status: Full Family Communication: none Disposition Plan: follow in SDU  Consultants: GI  Procedures: EGD today  Antibiotics: none  DVT prophylaxis: SCDs  Objective: Blood pressure 130/65, pulse 75, temperature 98.2 F (36.8 C), temperature source Oral, resp. rate 23, height 5\' 4"  (1.626 m), weight 82 kg (180 lb 12.4 oz), SpO2 99.00%.  Intake/Output Summary (Last 24 hours) at 09/30/13 1540 Last data filed at 09/30/13 1300  Gross per 24 hour  Intake 2110.83 ml  Output   2525 ml  Net -414.17 ml     Exam: General: No acute respiratory distress Lungs: Clear to auscultation bilaterally without wheezes or crackles Cardiovascular: Regular rate and rhythm without murmur gallop or rub normal S1 and S2 Abdomen: Nontender, nondistended, soft, bowel sounds positive, no  rebound, no ascites, no appreciable mass Extremities: No significant cyanosis, clubbing, or edema bilateral lower extremities  Data Reviewed: Basic Metabolic Panel:  Recent Labs Lab 09/29/13 0923 09/29/13 1443 09/30/13 0655  NA 140 143 141  K 4.4 4.7 3.8  CL 104 107 108  CO2 21 23 23   GLUCOSE 112* 94 93  BUN 40* 43* 27*  CREATININE 0.99 0.83 0.70  CALCIUM 8.8 8.2* 7.5*   Liver Function Tests:  Recent Labs Lab 09/29/13 0923 09/30/13 0655  AST 13 12  ALT 5 <5  ALKPHOS 52 45  BILITOT 0.3 0.7  PROT 4.9* 4.4*  ALBUMIN  --  2.3*   No results found for this basename: LIPASE, AMYLASE,  in the last 168 hours No results found for this basename: AMMONIA,  in the last 168 hours CBC:  Recent Labs Lab 09/29/13 0923 09/29/13 1443 09/30/13 0655 09/30/13 1500  WBC 13.0* 12.2* 8.3 9.9  NEUTROABS 9.5* 10.2*  --   --   HGB 5.8* 5.5* 7.5* 7.7*  HCT 18.4* 17.2* 22.9* 23.3*  MCV 78* 78.2 81.8 82.0  PLT  --  142* 94* 95*   Cardiac Enzymes: No results found for this basename: CKTOTAL, CKMB, CKMBINDEX, TROPONINI,  in the last 168 hours BNP (last 3 results) No results found for this basename: PROBNP,  in the last 8760 hours CBG: No results found for this basename: GLUCAP,  in the last 168 hours  Recent Results (from the past  240 hour(s))  MRSA PCR SCREENING     Status: None   Collection Time    09/29/13  6:58 PM      Result Value Range Status   MRSA by PCR NEGATIVE  NEGATIVE Final   Comment:            The GeneXpert MRSA Assay (FDA     approved for NASAL specimens     only), is one component of a     comprehensive MRSA colonization     surveillance program. It is not     intended to diagnose MRSA     infection nor to guide or     monitor treatment for     MRSA infections.     Studies:  Recent x-ray studies have been reviewed in detail by the Attending Physician  Scheduled Meds:  Scheduled Meds: . carbidopa-levodopa  1 tablet Oral QHS  . carbidopa-levodopa  1.5  tablet Oral QID  . furosemide  20 mg Intravenous Once  . furosemide  20 mg Oral BID  . pramipexole  1.5 mg Oral TID   Continuous Infusions: . pantoprozole (PROTONIX) infusion 8 mg/hr (09/30/13 1100)    Time spent on care of this patient: >35 min   Debbe Odea, MD  [facility] Office  [phone] Pager - Text Page per Shea Evans as per below:  On-Call/Text Page:      [name]      password [email or web address]  If 7PM-7AM, please contact night-coverage [email or web address] Password [email or web address] 09/30/2013, 3:40 PM   LOS: 1 day

## 2013-09-30 NOTE — Op Note (Signed)
[facility] [place], [id number]   ENDOSCOPY PROCEDURE REPORT PATIENT: [name], [name]  MR#: [id number] BIRTHDATE: Feb 12, 1941 , 72  yrs. old GENDER: Female ENDOSCOPIST: [name], MD REFERRED BY: PROCEDURE DATE:  09/30/2013 PROCEDURE:  EGD w/ biopsy and EGD w/ control of bleeding ASA CLASS:     Class IV INDICATIONS:  previous gastric ulcer (treated endoscopically Dr. Hilarie Fredrickson, resolved at 2 month follow up EGD); now with melena, anemia. MEDICATIONS: Fentanyl 75 mcg IV and Versed 7 mg IV TOPICAL ANESTHETIC: none DESCRIPTION OF PROCEDURE: After the risks benefits and alternatives of the procedure were thoroughly explained, informed consent was obtained.  The Pentax Gastroscope [id number] endoscope was introduced through the mouth and advanced to the second portion of the duodenum. Without limitations.  The instrument was slowly withdrawn as the mucosa was fully examined.  There was a medium to large sized hiatal hernia.  There was no blood in the UGI tract.  There was diffuse mild, pan gastritis and the distal stomach was biospied to check for H.  pylori.  Along lesser curvature of stomach, proximal to mid gastric body there was a cratered 74mm ulcer with clear protuberant visible vessel.  The ulcer was treated wit injection of dilute epinephrine and then a 7Fr Bicap Gold Probe was used for coaptive coagulation treatment. During treatment the ulcer began to ooze, by end of the procedure the oozing was very minimal.  Retroflexed views revealed no abnormalities.     The scope was then withdrawn from the patient and the procedure completed. COMPLICATIONS: There were no complications. ENDOSCOPIC IMPRESSION: There was a medium to large sized hiatal hernia.  There was no blood in the UGI tract.  There was diffuse mild, pan gastritis and the distal stomach was biospied to check for H.  pylori.  Along lesser curvature of stomach, proximal to mid  gastric body there was a cratered 55mm ulcer with clear protuberant visible vessel.  The ulcer was treated wit injection of dilute epinephrine and then a 7Fr Bicap Gold Probe was used for coaptive coagulation treatment. During treatment the ulcer began to ooze, by end of the procedure the oozing was very minimal.  RECOMMENDATIONS: Continue IV PPI gtts another 36-48 hours, then twice daily PPI.  She does not take NSAIDs very often, this ulcer is in same location as previous ulcer despite PPI (I believe she's only been on once daily) and perhaps it is an atypical appearing Cameron's ulcer related to her large hiatal hernia.  After this acute illness, she will need repeat EGD in 7-8 weeks (Dr.  Hilarie Fredrickson) to confirm ulcer healing.   eSigned:  [name], MD 09/30/2013 1:18 PM   CC: Dr. Zenovia Jarred

## 2013-09-30 NOTE — H&P (View-Only) (Signed)
[facility] Consult: 3:50 PM 09/29/2013  LOS: 0 days    Referring Provider: Dr Kathrynn Humble Primary Care Physician:  Hollace Kinnier, DO Primary Gastroenterologist:  Dr. Hilarie Fredrickson    Reason for Consultation:  Melena, anemia.     HPI: [name] is a 73 y.o. female.  Has Parkinson's disease. Hx of bleeding gastritic ulcer and erosive esophagitis in 09/2011. Transfused with 2 PRBCs during admission.  Discharged on PPI and Carafate as well as po Iron.  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  However biopsy of z line was benign.    Home meds list BID Protonix 40 mg, however she says she has not taken this for at least one year. No ASA or NSAIDs  In ED today with 3 days of dark stool. She has felt weak and a bit dizzy.  No syncopal spell.  + DOE.  No nausea, no anorexia.  + early satiety Hgb is 5.3, compared to 10 to 11 in July thru November 2014.  Stools normally every 2 to 3 days and sometimes requires laxative.  sinc stools turned dark, they are loose, but not watery and occurring 2 or so times daily.    Past Medical History  Diagnosis Date  . Parkinson's disease   . Hypertension   . Arthritis   . Anemia   . Cellulitis of lower leg 12/24/2011  . Acute bronchitis   . Other abnormal blood chemistry   . Chronic systolic heart failure   . Acute on chronic systolic heart failure   . Cellulitis and abscess of leg, except foot   . Hypotension, unspecified   . Helicobacter pylori (H. pylori)   . Acute gastric ulcer with hemorrhage, without mention of obstruction     Past Surgical History  Procedure Laterality Date  . Hip arthroscopy w/ labral repair    . Thyroidectomy    . Esophagogastroduodenoscopy  09/26/2011    Procedure: ESOPHAGOGASTRODUODENOSCOPY  (EGD);  Surgeon: Zenovia Jarred, MD;  Location: [facility] ENDOSCOPY;  Service: Gastroenterology;  Laterality: N/A;  To be done at bedside.    Prior to Admission medications   Medication Sig Start Date End Date Taking? Authorizing Provider  carbidopa-levodopa (SINEMET CR) 50-200 MG per tablet Take 1 tablet by mouth at bedtime. 10pm 06/17/13  Yes Marcial Pacas, MD  carbidopa-levodopa (SINEMET IR) 25-100 MG per tablet Take 1.5 tablets by mouth 4 (four) times daily. 6am, 10am, 2pm and 6pm 05/21/13  Yes Marcial Pacas, MD  albuterol (PROVENTIL) (2.5 MG/3ML) 0.083% nebulizer solution Take 2.5 mg by nebulization every 6 (six) hours as needed. For shortness of breath    Historical Provider, MD  b complex vitamins tablet Take 1 tablet by mouth daily.      Historical Provider, MD  furosemide (LASIX) 20 MG tablet Take 2 tablets (40 mg total) by mouth 2 (two) times daily. 08/02/13   [name] L Reed, DO  magnesium oxide (MAG-OX) 400 MG tablet Take 400 mg by mouth daily.     Historical  Provider, MD  pantoprazole (PROTONIX) 40 MG tablet Take 1 tablet (40 mg total) by mouth 2 (two) times daily. 09/29/13   Blanchie Serve, MD  potassium chloride SA (K-DUR,KLOR-CON) 20 MEQ tablet Take 20 mEq by mouth every other day.     Historical Provider, MD  pramipexole (MIRAPEX) 1.5 MG tablet Take 1 tablet (1.5 mg total) by mouth 3 (three) times daily. 07/28/13   Marcial Pacas, MD  triamcinolone cream (KENALOG) 0.1 % Apply 1 application topically daily. Applied to legs 07/15/13   Historical Provider, MD    Scheduled Meds: . ipratropium-albuterol  3 mL Nebulization Q4H   Infusions: . pantoprazole (PROTONIX) IV    . pantoprozole (PROTONIX) infusion     PRN Meds:    Allergies as of 09/29/2013 - Review Complete 09/29/2013  Allergen Reaction Noted  . Codeine Other (See Comments) 09/25/2011  . Penicillins Other (See Comments) 09/25/2011    Family History  Problem Relation Age of Onset  . GER disease Mother   . Heart disease Father     History     Social History  . Marital Status: Single    Spouse Name: N/A    Number of Children: 0  . Years of Education: 12   Occupational History  .      Retired   Social History Main Topics  . Smoking status: Never Smoker   . Smokeless tobacco: Never Used  . Alcohol Use: No  . Drug Use: No  . Sexual Activity: No   Other Topics Concern  . Not on file   Social History Narrative   Patient lives at home alone. Patient is retired. Patient has high school education.   Caffeine- one daily.   Right handed.    REVIEW OF SYSTEMS: Constitutional:  Weight stable within 5 # ENT:  No nose bleeds Pulm:  No cough.  +DOE CV:  No palpitations, no LE edema.  GU:  No hematuria, no frequency GI:  Per HPI Heme:  Per HPI   Transfusions:  Per HPI MS:  Fell in early 08/2013, lost her balance. Neuro:  No headaches, no peripheral tingling or numbness Derm:  No itching, no rash or sores.  Endocrine:  No sweats or chills.  No polyuria or dysuria Immunization:  09/14/13 flu shot.  Travel:  None beyond local counties in last few months.    PHYSICAL EXAM: Vital signs in last 24 hours: Filed Vitals:   09/29/13 1414  BP: 100/58  Pulse: 100  Resp: 24   Wt Readings from Last 3 Encounters:  09/29/13 81.647 kg (180 lb)  08/02/13 82.283 kg (181 lb 6.4 oz)  05/25/13 78.472 kg (173 lb)    General: pale, somewhat ill-looking elderly WF who is comfortable and alert Head:  No asymmetry or trauma  Eyes:  No icterus or pallor Ears: + HOH  Nose:  No discharge or bleeding Mouth:  Clear, moist oral MM. Neck:  No JVD.  No mass, no bruits Lungs:  Clear bil.  No cough or dyspnea Heart: RRR.  No MRG Abdomen:  Soft, NT, ND.  No mass or HSM.  Small, non-tender umbilical hernia. .   Rectal: deferred.  Stool dark, FOBT + per ED MD   Musc/Skeltl: no joint swelling. 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 Extremities:  No pedal edema  Neurologic:  Oriented x 3.  + pill rolling tremor and LE  tremor. Skin:  Telangectasia on upper trunk. Tattoos:  none Nodes:  No cervical adenopathy.   Psych:  Pleasant, cooperative, relaxed.   Intake/Output from previous day:   Intake/Output this shift:    LAB RESULTS:  Recent Labs  09/29/13 0923 09/29/13 1443  WBC 13.0* 12.2*  HGB 5.8* 5.5*  HCT 18.4* 17.2*  PLT  --  142*   BMET Lab Results  Component Value Date   NA 140 09/29/2013   NA 137 07/30/2013   NA 140 04/09/2013   K 4.4 09/29/2013   K 4.6 07/30/2013   K 4.7 04/09/2013   CL 104 09/29/2013   CL 104 07/30/2013   CL 102 04/09/2013   CO2 21 09/29/2013   CO2 25 07/30/2013   CO2 25 04/09/2013   GLUCOSE 112* 09/29/2013   GLUCOSE 97 07/30/2013   GLUCOSE 91 04/09/2013   BUN 40* 09/29/2013   BUN 13 07/30/2013   BUN 17 04/09/2013   CREATININE 0.99 09/29/2013   CREATININE 0.63 07/30/2013   CREATININE 0.88 04/09/2013   CALCIUM 8.8 09/29/2013   CALCIUM 8.9 07/30/2013   CALCIUM 9.3 04/09/2013   LFT  Recent Labs  09/29/13 0923  PROT 4.9*  AST 13  ALT 5  ALKPHOS 52  BILITOT 0.3   PT/INR None  RADIOLOGY STUDIES: No results found.  ENDOSCOPIC STUDIES: 12/2011  EGD  Dr Hilarie Fredrickson follow-up of gastric ulcer, history of esophagitis DESCRIPTION OF PROCEDURE: After the risks benefits and  alternatives of the procedure were thoroughly explained, informed  consent was obtained. The LB [id number] [id number] endoscope was  introduced through the mouth and advanced to the second portion of  the duodenum, without limitations. The instrument was slowly  withdrawn as the mucosa was fully examined.  <<PROCEDUREIMAGES>>  Irregular Z-line at the gastroesophageal junction 33 cm from the  incisors. Otherwise normal esophagus with healing of previously  seen esophagitis. A hiatal hernia was found. Cameron's lesions  in the cardia. A healed ulcer was noted in the angulus. Mild  gastritis was found. Biopsies of the antrum and body of the  stomach were obtained and sent to pathology. The duodenal bulb  was normal  in appearance, as was the postbulbar duodenum.  Retroflexed views revealed Retroflexion exam demonstrated findings as  previously described. The scope was then withdrawn from the  patient and the procedure completed.  COMPLICATIONS: None  ENDOSCOPIC IMPRESSION:  1) Irregular Z-line at the gastroesophageal junction. Healed  esophagitis.  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  5) Ulcer, healed. Scar seen on the lesser curve of the stomach  6) Mild gastritis. Multiple biopsies taken and sent to  pathology.  7) Normal duodenum  8) Retroflexion exam demonstrated findings as previously  described.  RECOMMENDATIONS:  1) Await pathology results  2) Continue taking your PPI (antiacid medicine, called  pantoprazole) once daily. It is best to be taken 20-30 minutes  prior to breakfast meal.  3) Your primary care doctor can continue to monitor your blood  counts and iron stores. Oral iron should continue  until iron stores replete. Pathology GASTRIC BODY AND ANTRAL TYPE MUCOSA WITH ASSOCIATED MILD CHRONIC INFLAMMATION, NO EVIDENCE OF HELICOBACTER PYLORI, INTESTINAL METAPLASIA, DYSPLASIA OR MALIGNANCY.   09/2011 EGD  Dr Hilarie Fredrickson For hematemesis, melena DESCRIPTION OF PROCEDURE: After the risks benefits and  alternatives of the procedure were thoroughly explained, informed  consent was obtained. The Pentax Gastroscope [id number] endoscope  was introduced through the mouth and advanced to the second  portion of the duodenum, without limitations. The instrument was  slowly withdrawn as the mucosa was fully examined.  <<PROCEDUREIMAGES>>  Severe, LA Grade D, erosive esophagitis was found in the middle  and distal esophagus. There was an ulcer, not bleeding and  superficial in the distal esophagus.. An ulcer was found on the  lesser curve in the proximal gastric body. There was a visible  vessel and injection was  performed with epinephrine 1:10,000 (4  total cc), then 2 hemostatic clips were successfully placed. No  bleeding at the end of the procedure. A large hiatal hernia was  found. The duodenal bulb was normal in appearance, as was the  postbulbar duodenum. Retroflexed views revealed findings as  previously described. The scope was then withdrawn from the  patient and the procedure completed.  ENDOSCOPIC IMPRESSION:  1) Severe likely reflux erosive esophagitis  2) Ulcer on lesser curvature of the stomach, felt to be acute  bleeding source. Successfully injected with epinephrine and  clipped with 2 hemostatic clips.  3) Hiatal hernia  4) Normal duodenum  RECOMMENDATIONS:  1) Continue PPI infusion for 72 hours, then switch to po BID PPI  for at least 3 months.  2) Follow Hgb closely and transfuse as needed to maintain Hgb >  8  3) Check H. Pylori serum Ab and treat if positive  4) Avoid NSAIDs  5) Repeat EGD in 12 weeks to ensure healing of esophagitis and  ulcer.      IMPRESSION:   *  GI bleed Hx of GI bleed 09/2011.  EGD then with clipping of gastric ulcer and severe esophagitis.  Follow up EGD 12/2011 with healed esophagitis and gastric ulcer, cameron lesions in hiatal hernia.  Irregular z line with non-worrisome pathology.  Has not been taking PPI for over one year per her recall so she may have recurrent ulcer.     PLAN:     *  Transfuse blood, first of 3 units has been started.  *  EGD tomorrow. Orders in depot.  Sips of clears ok tonight. IV Protonix drip in place. Azucena Freed  09/29/2013, 3:50 PM Pager: [phone]  ________________________________________________________________________  Velora Heckler GI MD note:  I personally examined the patient, reviewed the data and agree with the assessment and plan described above.  Planning on EGD later today.   Owens Loffler, MD [facility] Pager [phone]

## 2013-10-01 ENCOUNTER — Encounter (HOSPITAL_COMMUNITY): Payer: Self-pay | Admitting: Gastroenterology

## 2013-10-01 LAB — CBC
HCT: 22.1 % — ABNORMAL LOW (ref 36.0–46.0)
HCT: 25.5 % — ABNORMAL LOW (ref 36.0–46.0)
HEMOGLOBIN: 8.4 g/dL — AB (ref 12.0–15.0)
Hemoglobin: 7.3 g/dL — ABNORMAL LOW (ref 12.0–15.0)
MCH: 27.3 pg (ref 26.0–34.0)
MCH: 26.8 pg (ref 26.0–34.0)
MCHC: 33 g/dL (ref 30.0–36.0)
MCHC: 32.9 g/dL (ref 30.0–36.0)
MCV: 81.5 fL (ref 78.0–100.0)
MCV: 82.8 fL (ref 78.0–100.0)
Platelets: 104 10*3/uL — ABNORMAL LOW (ref 150–400)
Platelets: 122 10*3/uL — ABNORMAL LOW (ref 150–400)
RBC: 2.67 MIL/uL — AB (ref 3.87–5.11)
RBC: 3.13 MIL/uL — AB (ref 3.87–5.11)
RDW: 17.9 % — ABNORMAL HIGH (ref 11.5–15.5)
RDW: 17.8 % — AB (ref 11.5–15.5)
WBC: 9.2 10*3/uL (ref 4.0–10.5)
WBC: 11 10*3/uL — AB (ref 4.0–10.5)

## 2013-10-01 LAB — URINE CULTURE

## 2013-10-01 LAB — GLUCOSE, CAPILLARY: GLUCOSE-CAPILLARY: 101 mg/dL — AB (ref 70–99)

## 2013-10-01 MED ORDER — EPINEPHRINE HCL 0.1 MG/ML IJ SOSY
PREFILLED_SYRINGE | INTRAMUSCULAR | Status: AC
  Filled 2013-10-01: qty 10

## 2013-10-01 MED ORDER — FUROSEMIDE 20 MG PO TABS
20.0000 mg | ORAL_TABLET | Freq: Every day | ORAL | Status: DC
  Filled 2013-10-01: qty 1

## 2013-10-01 NOTE — Progress Notes (Signed)
[facility] Progress Note    Since last GI note: EGD yesterday, see full report in chart. Gastric ulcer with visible vessel, treated.  No melena in about 48 hours.  She tolerated solid food last night.  Objective: Vital signs in last 24 hours: Temp:  [97.7 F (36.5 C)-98.9 F (37.2 C)] 98.9 F (37.2 C) (01/09 0751) Pulse Rate:  [52-107] 69 (01/09 0500) Resp:  [14-28] 17 (01/09 0500) BP: (86-169)/(36-122) 121/74 mmHg (01/09 0500) SpO2:  [69 %-100 %] 96 % (01/09 0500) Weight:  [182 lb 1.6 oz (82.6 kg)] 182 lb 1.6 oz (82.6 kg) (01/09 0500)   General: alert and oriented times 3 Heart: regular rate and rythm Abdomen: soft, non-tender, non-distended, normal bowel sounds   Lab Results:  Recent Labs  09/30/13 1850 10/01/13 0045 10/01/13 0740  WBC 10.4 9.2 11.0*  HGB 7.5* 7.3* 8.4*  PLT 103* 104* 122*  MCV 82.6 82.8 81.5    Recent Labs  09/29/13 0923 09/29/13 1443 09/30/13 0655  NA 140 143 141  K 4.4 4.7 3.8  CL 104 107 108  CO2 21 23 23   GLUCOSE 112* 94 93  BUN 40* 43* 27*  CREATININE 0.99 0.83 0.70  CALCIUM 8.8 8.2* 7.5*    Recent Labs  09/29/13 0923 09/30/13 0655  PROT 4.9* 4.4*  ALBUMIN  --  2.3*  AST 13 12  ALT 5 <5  ALKPHOS 52 45  BILITOT 0.3 0.7    Recent Labs  09/29/13 1443  INR 1.27    Medications: Scheduled Meds: . carbidopa-levodopa  1 tablet Oral QHS  . carbidopa-levodopa  1.5 tablet Oral QID  . furosemide  20 mg Intravenous Once  . furosemide  20 mg Oral BID  . pramipexole  1.5 mg Oral TID   Continuous Infusions: . pantoprozole (PROTONIX) infusion 8 mg/hr (10/01/13 0500)   PRN Meds:.acetaminophen, acetaminophen, albuterol, ondansetron (ZOFRAN) IV, ondansetron    Assessment/Plan: 73 y.o. female with recurrent gastric ulcer  She was NOT taking PPI prior to admission.  For now, PPI gtts another 24 hours, then OK to change to PPI twice daily (PO) and d/c home as long as no rebleeding.  My office will coordinate  return visit with Dr. Hilarie Fredrickson in 5-6 weeks and from there she will need repeat EGD in 2 months from now to confirm ulcer healing. Should stay on twice daily PPI until time of repeat endoscopy. May be OK to decrease to once daily PPI if the ulcer has healed but would not come off PPI completely as it seems likely that this ulcer (perhaps Cameron's type) will reform if she is completely off PPI.    [name], MD  10/01/2013, 8:28 AM [facility] Pager [phone]

## 2013-10-01 NOTE — Progress Notes (Signed)
[facility] Progress Note [place] TEAM 1 - Stepdown/ICU TEAM   [name] QQI:[id number] DOB: [birth year] DOA: 09/29/2013 PCP: Hollace Kinnier, DO  Brief narrative: 73 y.o. female with a history of prior GI bleed secondary to a gastric ulcer in 9417, diastolic heart failure, cor pulmonale, and hypertension who presented with black stools for 4-5 days..  She went to see her PCP and was found to have a Hgb of 5.   Subjective: Pt c/o nausea and inability to tolerate diet.  She denies cp, vomiting, or sob.  She feels very weak and is having trouble walking as a result.   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's ulcer - cont IV PPI  Through 1/10 per GI then twice daily oral PPI - Repeat EGD in 8 wks ( by Dr Hilarie Fredrickson)   Thrombocytopenia, unspecified - possibly due to consumption - improving   Parkinson's disease - cont Sinemet - PT/OT consults - may need CIR  Chronic Diastolic CHF  - grade 2 -appears euvolemic at this time   HTN - BP well controlled  Code Status: Full Family Communication: no family present at time of exam  Disposition Plan: stable for transfer to medical bed - PT/OT - ?CIR stay   Consultants: GI  Procedures: EGD 1/8 - medium to large sized hiatal hernia - no blood in the UGI tract - diffuse mild, pan gastritis - mid gastric cratered 52mm ulcer (injected and coagulated)  Antibiotics: none  DVT prophylaxis: SCDs  Objective: Blood pressure 121/74, pulse 69, temperature 98.4 F (36.9 C), temperature source Oral, resp. rate 17, height 5\' 4"  (1.626 m), weight 82.6 kg (182 lb 1.6 oz), SpO2 96.00%.  Intake/Output Summary (Last 24 hours) at 10/01/13 1503 Last data filed at 10/01/13 0300  Gross per 24 hour  Intake    240 ml  Output    900 ml  Net   -660 ml   Exam: General: No acute respiratory distress Lungs:  Clear to auscultation bilaterally without wheezes or crackles Cardiovascular: Regular rate and rhythm without murmur gallop or rub normal S1 and S2 Abdomen: Nontender, nondistended, soft, bowel sounds positive, no rebound, no ascites, no appreciable mass Extremities: No significant cyanosis, clubbing, edema bilateral lower extremities  Data Reviewed: Basic Metabolic Panel:  Recent Labs Lab 09/29/13 0923 09/29/13 1443 09/30/13 0655  NA 140 143 141  K 4.4 4.7 3.8  CL 104 107 108  CO2 21 23 23   GLUCOSE 112* 94 93  BUN 40* 43* 27*  CREATININE 0.99 0.83 0.70  CALCIUM 8.8 8.2* 7.5*   Liver Function Tests:  Recent Labs Lab 09/29/13 0923 09/30/13 0655  AST 13 12  ALT 5 <5  ALKPHOS 52 45  BILITOT 0.3 0.7  PROT 4.9* 4.4*  ALBUMIN  --  2.3*   CBC:  Recent Labs Lab 09/29/13 0923  09/29/13 1443 09/30/13 0655 09/30/13 1500 09/30/13 1850 10/01/13 0045 10/01/13 0740  WBC 13.0*  < > 12.2* 8.3 9.9 10.4 9.2 11.0*  NEUTROABS 9.5*  --  10.2*  --   --   --   --   --   HGB 5.8*  --  5.5* 7.5* 7.7* 7.5* 7.3* 8.4*  HCT 18.4*  --  17.2* 22.9* 23.3* 22.8* 22.1* 25.5*  MCV 78*  --  78.2 81.8 82.0 82.6 82.8 81.5  PLT  --   < > 142* 94* 95* 103* 104* 122*  < > = values in this interval not displayed.  Recent Results (from the past 240 hour(s))  URINE CULTURE     Status: None   Collection Time    09/29/13  3:50 PM      Result Value Range Status   Specimen Description URINE, RANDOM   Final   Special Requests NONE   Final   Culture  Setup Time     Final   Value: 09/30/2013 00:42     Performed at [facility] Count     Final   Value: 20,OOO COLONIES/ML     Performed at [facility]   Culture     Final   Value: ESCHERICHIA COLI     Performed at [facility]   Report Status 10/01/2013 FINAL   Final   Organism ID, Bacteria ESCHERICHIA COLI   Final  MRSA PCR SCREENING     Status: None   Collection Time    09/29/13  6:58 PM      Result Value Range  Status   MRSA by PCR NEGATIVE  NEGATIVE Final   Comment:            The GeneXpert MRSA Assay (FDA     approved for NASAL specimens     only), is one component of a     comprehensive MRSA colonization     surveillance program. It is not     intended to diagnose MRSA     infection nor to guide or     monitor treatment for     MRSA infections.     Studies:  Recent x-ray studies have been reviewed in detail by the Attending Physician  Scheduled Meds:  Scheduled Meds: . carbidopa-levodopa  1 tablet Oral QHS  . carbidopa-levodopa  1.5 tablet Oral QID  . furosemide  20 mg Intravenous Once  . furosemide  20 mg Oral BID  . pramipexole  1.5 mg Oral TID   Continuous Infusions: . pantoprozole (PROTONIX) infusion 8 mg/hr (10/01/13 0500)    Time spent on care of this patient: 72 min   MCCLUNG,JEFFREY T, MD  [facility] Office  [phone] Pager - Text Page per Shea Evans as per below:  On-Call/Text Page:      [name]      password [email or web address]  If 7PM-7AM, please contact night-coverage [email or web address] Password [email or web address] 10/01/2013, 3:03 PM   LOS: 2 days

## 2013-10-02 LAB — BASIC METABOLIC PANEL
BUN: 13 mg/dL (ref 6–23)
CALCIUM: 8.2 mg/dL — AB (ref 8.4–10.5)
CO2: 29 mEq/L (ref 19–32)
Chloride: 103 mEq/L (ref 96–112)
Creatinine, Ser: 0.8 mg/dL (ref 0.50–1.10)
GFR calc Af Amer: 83 mL/min — ABNORMAL LOW (ref >90)
GFR calc non Af Amer: 71 mL/min — ABNORMAL LOW (ref >90)
GLUCOSE: 109 mg/dL — AB (ref 70–99)
POTASSIUM: 4 meq/L (ref 3.7–5.3)
SODIUM: 139 meq/L (ref 137–147)

## 2013-10-02 LAB — HEMOGLOBIN AND HEMATOCRIT, BLOOD
HEMATOCRIT: 26.3 % — AB (ref 36.0–46.0)
HEMOGLOBIN: 8.4 g/dL — AB (ref 12.0–15.0)

## 2013-10-02 LAB — CBC
HCT: 23.6 % — ABNORMAL LOW (ref 36.0–46.0)
HEMOGLOBIN: 7.7 g/dL — AB (ref 12.0–15.0)
MCH: 27 pg (ref 26.0–34.0)
MCHC: 32.6 g/dL (ref 30.0–36.0)
MCV: 82.8 fL (ref 78.0–100.0)
PLATELETS: 116 10*3/uL — AB (ref 150–400)
RBC: 2.85 MIL/uL — AB (ref 3.87–5.11)
RDW: 18.3 % — ABNORMAL HIGH (ref 11.5–15.5)
WBC: 9.7 10*3/uL (ref 4.0–10.5)

## 2013-10-02 MED ORDER — PANTOPRAZOLE SODIUM 40 MG PO TBEC: 40.0000 mg | DELAYED_RELEASE_TABLET | Freq: Two times a day (BID) | ORAL | Status: DC

## 2013-10-02 MED ORDER — FUROSEMIDE 10 MG/ML IJ SOLN
40.0000 mg | Freq: Once | INTRAMUSCULAR | Status: AC
  Administered 2013-10-02: 40 mg via INTRAVENOUS
  Filled 2013-10-02: qty 4

## 2013-10-02 NOTE — Progress Notes (Signed)
   CARE MANAGEMENT NOTE 10/02/2013  Patient:  [name], [name]   Account Number:  [id number]  Date Initiated:  09/30/2013  Documentation initiated by:  Marvetta Gibbons  Subjective/Objective Assessment:   Pt admitted with GIB, hgb 5     Action/Plan:   PTA pt lived at home- NCM to follow for d/c needs   Anticipated DC Date:  10/02/2013   Anticipated DC Plan:  [place]  CM consult      [facility] Choice  Resumption Of Svcs/PTA Provider   Choice offered to / List presented to:  C-1 Patient        [place] arranged  HH-1 RN  [place]      [place].   Status of service:  Completed, signed off Medicare Important Message given?   (If response is "NO", the following Medicare IM given date fields will be blank) Date Medicare IM given:   Date Additional Medicare IM given:    Discharge Disposition:  [place]  Per UR Regulation:  Reviewed for med. necessity/level of care/duration of stay  If discussed at [place] of Stay Meetings, dates discussed:    Comments:  10/02/13 09:15 CM spoke with pt in room to offer choice.  Pt states she thinks she may be set up with [facility] and wishes to remain with [facility] for HHPT/RN/SW/aide.  Address and contact number verified.  Pt states she has a walker at home and thinks she got it in 2013 so, unfortunately, according to [facility] pt would have to pay out-of-pocket and does not wish to do so.  Marland Kitchen  Referral faxed to [facility].  No other CM needs were communicated.  Mariane Masters, BSN, CM [phone].

## 2013-10-02 NOTE — Discharge Summary (Addendum)
[facility]                                                                                   [name], is a 73 y.o. female  DOB 1941-02-11  MRN [id number].  Admission date:  09/29/2013  Admitting Physician  Jonetta Osgood, MD  Discharge Date:  10/04/2013   Primary MD  Hollace Kinnier, DO  Recommendations for primary care physician for things to follow:   Follow H&H, make sure patient follows with GI physician on a close basis post discharge   Admission Diagnosis  GI bleeding [578.9] Dyspnea [786.09] Anemia [285.9] GI bleed [id number] Diastolic dysfunction [id number] Anemia due to blood loss, acute [285.1] Gastric ulcer [531.90] Parkinson's disease [332.0]  Discharge Diagnosis UGI bleed  Principal Problem:   Anemia due to blood loss, acute Active Problems:   Parkinson's disease   HTN (hypertension)   Diastolic dysfunction, grade 2   UGI bleed   Thrombocytopenia, unspecified      Past Medical History  Diagnosis Date  . Parkinson's disease   . Hypertension   . Arthritis   . Anemia   . Cellulitis of lower leg 12/24/2011  . Acute bronchitis   . Other abnormal blood chemistry   . Chronic systolic heart failure   . Acute on chronic systolic heart failure   . Cellulitis and abscess of leg, except foot   . Hypotension, unspecified   . Helicobacter pylori (H. pylori)   . Acute gastric ulcer with hemorrhage, without mention of obstruction     Past Surgical History  Procedure Laterality Date  . Hip arthroscopy w/ labral repair    . Thyroidectomy    . Esophagogastroduodenoscopy  09/26/2011    Procedure: ESOPHAGOGASTRODUODENOSCOPY (EGD);  Surgeon: Zenovia Jarred, MD;  Location[facility] ENDOSCOPY;  Service: Gastroenterology;  Laterality: N/A;  To be done at bedside.  . Esophagogastroduodenoscopy N/A 09/30/2013    Procedure: ESOPHAGOGASTRODUODENOSCOPY (EGD);  Surgeon: [name], MD;  Location: [facility];  Service: Endoscopy;  Laterality: N/A;     Discharge Condition:  stable       Follow-up Information   Follow up with [facility]. (for home health physical therapy, nurse, aide, social worker)    Contact information:   [address] [place] [address] [phone]       Follow up with REED, [name], DO. Schedule an appointment as soon as possible for a visit in 3 days.   Specialty:  Geriatric Medicine   Contact information:   [place]. [place] [address] [phone]       Follow up with [name], MD. Schedule an appointment as soon as possible for a visit in 1 week.   Specialty:  Gastroenterology   Contact information:   [address] [place] [address] [phone]         Consults obtained - GI   Discharge Medications      Medication List         albuterol (2.5 MG/3ML) 0.083% nebulizer solution  Commonly known as:  PROVENTIL  Take 2.5 mg by nebulization every 6 (six) hours as needed. For shortness of breath  b complex vitamins tablet  Take 1 tablet by mouth daily.     carbidopa-levodopa 25-100 MG per tablet  Commonly known as:  SINEMET IR  Take 1.5 tablets by mouth 4 (four) times daily. 6am, 10am, 2pm and 6pm     carbidopa-levodopa 50-200 MG per tablet  Commonly known as:  SINEMET CR  Take 1 tablet by mouth at bedtime. 10pm     furosemide 20 MG tablet  Commonly known as:  LASIX  Take 20 mg by mouth 2 (two) times daily. Sometimes she takes once daily, sometimes twice daily     magnesium oxide 400 MG tablet  Commonly known as:  MAG-OX  Take 400 mg by mouth daily.     pantoprazole 40 MG tablet  Commonly known as:  PROTONIX  Take 1 tablet (40 mg total) by mouth 2 (two) times daily.     pramipexole 1.5 MG tablet  Commonly known as:  MIRAPEX  Take 1 tablet (1.5 mg total) by mouth 3 (three) times daily.     triamcinolone cream 0.1 %  Commonly known as:  KENALOG  Apply 1 application topically daily. Applied to legs         Diet and Activity  recommendation: See Discharge Instructions below   Discharge Instructions     Follow with Primary MD REED, [name], DO in 3 days   Get CBC, CMP, checked 3 days by Primary MD and again as instructed by your Primary MD    Activity: As tolerated with Full fall precautions use walker/cane & assistance as needed   Disposition CIR versus other rehabilitation     Diet:  Heart Healthy.  For Heart failure patients - Check your Weight same time everyday, if you gain over 2 pounds, or you develop in leg swelling, experience more shortness of breath or chest pain, call your Primary MD immediately. Follow Cardiac Low Salt Diet and 1.8 lit/day fluid restriction.   On your next visit with her primary care physician please Get Medicines reviewed and adjusted.  Please request your Prim.MD to go over all Hospital Tests and Procedure/Radiological results at the follow up, please get all Hospital records sent to your Prim MD by signing hospital release before you go home.   If you experience worsening of your admission symptoms, develop shortness of breath, life threatening emergency, suicidal or homicidal thoughts you must seek medical attention immediately by calling 911 or calling your MD immediately  if symptoms less severe.  You Must read complete instructions/literature along with all the possible adverse reactions/side effects for all the Medicines you take and that have been prescribed to you. Take any new Medicines after you have completely understood and accpet all the possible adverse reactions/side effects.   Do not drive and provide baby sitting services if your were admitted for syncope or siezures until you have seen by Primary MD or a Neurologist and advised to do so again.  Do not drive when taking Pain medications.    Do not take more than prescribed Pain, Sleep and Anxiety Medications  Special Instructions: If you have smoked or chewed Tobacco  in the last 2 yrs please stop  smoking, stop any regular Alcohol  and or any Recreational drug use.  Wear Seat belts while driving.   Please note  You were cared for by a hospitalist during your hospital stay. If you have any questions about your discharge medications or the care you received while you were in the hospital after you are discharged,  you can call the unit and asked to speak with the hospitalist on call if the hospitalist that took care of you is not available. Once you are discharged, your primary care physician will handle any further medical issues. Please note that NO REFILLS for any discharge medications will be authorized once you are discharged, as it is imperative that you return to your primary care physician (or establish a relationship with a primary care physician if you do not have one) for your aftercare needs so that they can reassess your need for medications and monitor your lab values.    Major procedures and Radiology Reports - PLEASE review detailed and final reports for all details, in brief -   EGD noting gastric ulcer with visible blood vessel.   No results found.  Micro Results      Recent Results (from the past 240 hour(s))  URINE CULTURE     Status: None   Collection Time    09/29/13  3:50 PM      Result Value Range Status   Specimen Description URINE, RANDOM   Final   Special Requests NONE   Final   Culture  Setup Time     Final   Value: 09/30/2013 00:42     Performed at [facility] Count     Final   Value: 20,OOO COLONIES/ML     Performed at [facility]   Culture     Final   Value: ESCHERICHIA COLI     Performed at [facility]   Report Status 10/01/2013 FINAL   Final   Organism ID, Bacteria ESCHERICHIA COLI   Final  MRSA PCR SCREENING     Status: None   Collection Time    09/29/13  6:58 PM      Result Value Range Status   MRSA by PCR NEGATIVE  NEGATIVE Final   Comment:            The GeneXpert MRSA Assay (FDA     approved  for NASAL specimens     only), is one component of a     comprehensive MRSA colonization     surveillance program. It is not     intended to diagnose MRSA     infection nor to guide or     monitor treatment for     MRSA infections.     History of present illness and  Hospital Course:     Kindly see H&P for history of present illness and admission details, please review complete Labs, Consult reports and Test reports for all details in brief [name], is a 73 y.o. female, patient with history of  Parkinson's disease, recurrent peptic ulcer disease, chronic diastolic CHF, chronic cor pulmonale, hypertension who was admitted to the hospital with reoccurrence of upper GI bleed causing melena.   She had upper GI bleed due to reoccurrence of gastric ulcer which was proven by EGD, this caused acute anemia due to blood loss requiring 3 units of packed RBC transfusion and treatment with IV PPI, she was seen by [facility] underwent EGD, subsequently her H&H has dropped and she has had no further melena or hematemesis. Her H&H posttransfusion have been stable and she will be discharged home on twice a day PPI with close outpatient GI followup. She had some consumption thrombocytopenia which remained stable.    Of note she has chronic Parkinson's disease, chronic grade 2 diastolic CHF, cor pulmonale and hypertension these  problems were stable this admission and she will commence her home medications unchanged with close followup with PCP post discharge.    She appears quite weak, she says this is her baseline, she wanted to go home however after discussions with physical therapist she has agreed to go to a rehabilitation      Today   Subjective:   [name] today has no headache,no chest abdominal pain,no new weakness tingling or numbness, feels much better and agreeable now to going to a rehabilitation.   Objective:   Blood pressure 117/72, pulse 80, temperature 98.3 F (36.8 C),  temperature source Oral, resp. rate 18, height 5\' 4"  (1.626 m), weight 80.876 kg (178 lb 4.8 oz), SpO2 95.00%.   Intake/Output Summary (Last 24 hours) at 10/04/13 1029 Last data filed at 10/04/13 0934  Gross per 24 hour  Intake    120 ml  Output    625 ml  Net   -505 ml    Exam Awake Alert, Oriented *3, No new F.N deficits, Normal affect, generalized diffuse tremors. [place].AT,PERRAL Supple Neck,No JVD, No cervical lymphadenopathy appriciated.  Symmetrical Chest wall movement, Good air movement bilaterally, CTAB RRR,No Gallops,Rubs or new Murmurs, No Parasternal Heave +ve B.Sounds, Abd Soft, Non tender, No organomegaly appriciated, No rebound -guarding or rigidity. No Cyanosis, Clubbing or edema, No new Rash or bruise  Data Review   CBC w Diff: Lab Results  Component Value Date   WBC 9.7 10/02/2013   WBC 13.0* 09/29/2013   HGB 8.4* 10/04/2013   HCT 26.4* 10/04/2013   PLT 116* 10/02/2013   LYMPHOPCT 10* 09/29/2013   MONOPCT 6 09/29/2013   EOSPCT 1 09/29/2013   BASOPCT 0 09/29/2013    CMP: Lab Results  Component Value Date   NA 139 10/02/2013   NA 140 09/29/2013   K 4.0 10/02/2013   CL 103 10/02/2013   CO2 29 10/02/2013   BUN 13 10/02/2013   BUN 40* 09/29/2013   CREATININE 0.80 10/02/2013   PROT 4.4* 09/30/2013   PROT 4.9* 09/29/2013   ALBUMIN 2.3* 09/30/2013   BILITOT 0.7 09/30/2013   ALKPHOS 45 09/30/2013   AST 12 09/30/2013   ALT <5 09/30/2013  .   Total Time in preparing paper work, data evaluation and todays exam - 35 minutes  Thurnell Lose M.D on 10/04/2013 at 10:29 AM  [facility] Office  [phone]

## 2013-10-02 NOTE — Evaluation (Signed)
Physical Therapy Evaluation Patient Details Name: [name] MRN: [id number] DOB: 1940/10/14 Today's Date: 10/02/2013 Time: 1325-1400 PT Time Calculation (min): 35 min  PT Assessment / Plan / Recommendation History of Present Illness  Pt. admitted with UGI bleed, ABLA, parkinsons, diastolic dysfunction, cor pulmonale.  Clinical Impression  Pt. Presents with a significant decrease in her functional mobility/gait and she lives alone.  She needs acute PT to address the below mobility issues.  She does not appear able to manage self at home if she were to Dc today.  Have discussed with RN Hassan Rowan and she notified Dr. Candiss Norse.  Have recommended CIR for possible admission for post acute rehab.  Dr. Candiss Norse has cancelled DC home.    PT Assessment  Patient needs continued PT services    Follow Up Recommendations  CIR;Supervision/Assistance - 24 hour;Supervision for mobility/OOB    Does the patient have the potential to tolerate intense rehabilitation      Barriers to Discharge Decreased caregiver support      Equipment Recommendations  Rolling walker with 5" wheels    Recommendations for Other Services Rehab consult   Frequency Min 3X/week    Precautions / Restrictions Precautions Precautions: Fall Precaution Comments: reports fall  x 2 in November 14 Restrictions Weight Bearing Restrictions: No   Pertinent Vitals/Pain See vitals tab       Mobility  Bed Mobility Overal bed mobility: Needs Assistance Bed Mobility: Supine to Sit;Sit to Supine Supine to sit: Min assist Sit to supine: Min guard General bed mobility comments: min assist to transition to sitting from supine/sidelying; min guard assist back to supine; 2 assist for moving to [facility] Transfers Overall transfer level: Needs assistance Equipment used: Rolling walker (2 wheeled) Transfers: Sit to/from Stand Sit to Stand: Min assist General transfer comment: min assist to rise to stand and to control descent to  bed Ambulation/Gait Ambulation/Gait assistance:  (pt. unable due to weakness/dizziness)    Exercises     PT Diagnosis: Difficulty walking;Generalized weakness  PT Problem List: Decreased strength;Decreased activity tolerance;Decreased balance;Decreased mobility;Decreased knowledge of use of DME;Cardiopulmonary status limiting activity PT Treatment Interventions: DME instruction;Gait training;Functional mobility training;Therapeutic activities;Therapeutic exercise;Balance training;Patient/family education     PT Goals(Current goals can be found in the care plan section) Acute Rehab PT Goals Patient Stated Goal: rehab then home, pt. agreeable to CIR PT Goal Formulation: With patient Time For Goal Achievement: 10/09/13 Potential to Achieve Goals: Good  Visit Information  Last PT Received On: 10/02/13 Assistance Needed: +1 History of Present Illness: Pt. admitted with UGI bleed, ABLA, parkinsons, diastolic dysfunction, cor pulmonale.       Prior [place] expects to be discharged to:: Private residence Living Arrangements: Alone Available Help at Discharge: Family;Other (Comment);Available PRN/intermittently (3 local brothers) Type of Home: House Home Access: Ramped entrance Home Layout: One level Home Equipment: Walker - 4 wheels;Bedside commode;Tub bench Prior Function Level of Independence: Independent with assistive device(s) Comments: Pt. reports she receives mobile meals and brother brings nightly meal Communication Communication: No difficulties    Cognition  Cognition Arousal/Alertness: Awake/alert Behavior During Therapy: WFL for tasks assessed/performed Overall Cognitive Status: Within Functional Limits for tasks assessed    Extremity/Trunk Assessment Upper Extremity Assessment Upper Extremity Assessment: Generalized weakness Lower Extremity Assessment Lower Extremity Assessment: Generalized weakness Cervical / Trunk  Assessment Cervical / Trunk Assessment: Kyphotic   Balance Balance Overall balance assessment: Needs assistance Sitting-balance support: Bilateral upper extremity supported;Feet supported Sitting balance-Leahy Scale: Fair Standing balance support: Bilateral  upper extremity supported;During functional activity Standing balance-Leahy Scale: Poor Standing balance comment: pt. with posterior tendancy in standing position and would fall back without support  End of Session PT - End of Session Equipment Utilized During Treatment: Gait belt Activity Tolerance: Patient limited by fatigue;Treatment limited secondary to medical complications (Comment) (limited by mild SOB and dizziness) Patient left: in bed;with call bell/phone within reach Nurse Communication: Mobility status;Other (comment) (pt not (I) enough for DC home alone)  GP     Ladona Ridgel 10/02/2013, 2:25 PM [name] Ren PT Acute Rehab Services [place] [phone]

## 2013-10-02 NOTE — Discharge Instructions (Signed)
Follow with Primary MD REED, [name], DO in 3 days   Get CBC, CMP, checked 3 days by Primary MD and again as instructed by your Primary MD    Activity: As tolerated with Full fall precautions use walker/cane & assistance as needed   Disposition Home     Diet:  Heart Healthy.  For Heart failure patients - Check your Weight same time everyday, if you gain over 2 pounds, or you develop in leg swelling, experience more shortness of breath or chest pain, call your Primary MD immediately. Follow Cardiac Low Salt Diet and 1.8 lit/day fluid restriction.   On your next visit with her primary care physician please Get Medicines reviewed and adjusted.  Please request your Prim.MD to go over all Hospital Tests and Procedure/Radiological results at the follow up, please get all Hospital records sent to your Prim MD by signing hospital release before you go home.   If you experience worsening of your admission symptoms, develop shortness of breath, life threatening emergency, suicidal or homicidal thoughts you must seek medical attention immediately by calling 911 or calling your MD immediately  if symptoms less severe.  You Must read complete instructions/literature along with all the possible adverse reactions/side effects for all the Medicines you take and that have been prescribed to you. Take any new Medicines after you have completely understood and accpet all the possible adverse reactions/side effects.   Do not drive and provide baby sitting services if your were admitted for syncope or siezures until you have seen by Primary MD or a Neurologist and advised to do so again.  Do not drive when taking Pain medications.    Do not take more than prescribed Pain, Sleep and Anxiety Medications  Special Instructions: If you have smoked or chewed Tobacco  in the last 2 yrs please stop smoking, stop any regular Alcohol  and or any Recreational drug use.  Wear Seat belts while driving.   Please  note  You were cared for by a hospitalist during your hospital stay. If you have any questions about your discharge medications or the care you received while you were in the hospital after you are discharged, you can call the unit and asked to speak with the hospitalist on call if the hospitalist that took care of you is not available. Once you are discharged, your primary care physician will handle any further medical issues. Please note that NO REFILLS for any discharge medications will be authorized once you are discharged, as it is imperative that you return to your primary care physician (or establish a relationship with a primary care physician if you do not have one) for your aftercare needs so that they can reassess your need for medications and monitor your lab values.

## 2013-10-03 LAB — TYPE AND SCREEN
ABO/RH(D): O POS
Antibody Screen: NEGATIVE
UNIT DIVISION: 0
UNIT DIVISION: 0
Unit division: 0
Unit division: 0

## 2013-10-03 MED ORDER — PANTOPRAZOLE SODIUM 40 MG PO TBEC
40.0000 mg | DELAYED_RELEASE_TABLET | Freq: Two times a day (BID) | ORAL | Status: DC
  Administered 2013-10-03 – 2013-10-05 (×5): 40 mg via ORAL
  Filled 2013-10-03 (×5): qty 1

## 2013-10-03 MED ORDER — FUROSEMIDE 40 MG PO TABS
40.0000 mg | ORAL_TABLET | Freq: Every day | ORAL | Status: DC
  Administered 2013-10-03 – 2013-10-05 (×3): 40 mg via ORAL
  Filled 2013-10-03 (×3): qty 1

## 2013-10-03 NOTE — Progress Notes (Signed)
[facility]                                                                                Patient Demographics  [name], is a 73 y.o. female, DOB - 1941/01/15, FY:[id number]  Admit date - 09/29/2013   Admitting Physician Evalee Mutton Kristeen Mans, MD  Outpatient Primary MD for the patient is REED, Atkinson, DO  LOS - 4   Chief Complaint  Patient presents with  . GI Bleeding       Brief narrative:  73 y.o. female with a history of prior GI bleed secondary to a gastric ulcer in 0000000, diastolic heart failure, cor pulmonale, and hypertension who presented with black stools for 4-5 days. She went to see her PCP and was found to have a Hgb of 5.    Assessment & Plan     Upper GI bleed due to reoccurrence of gastric ulcer which was proven by EGD, this caused acute anemia due to blood loss requiring 3 units of packed RBC transfusion and treatment with IV PPI, she was seen by [facility] underwent EGD, subsequently her H&H has not dropped and she has had no further melena or hematemesis. Her H&H posttransfusion have been stable and she will be discharged home on twice a day PPI with close outpatient GI followup.         Thrombocytopenia, unspecified  - possibly due to consumption  - improving    Parkinson's disease  - cont Sinemet - PT/OT consults - now will need rehab    Chronic Diastolic CHF - grade 2  -appears euvolemic at this time , home dose Lasix.    HTN  - BP well controlled, No meds needed     Code Status: Full  Family Communication:   Disposition Plan: [name]   Procedures EGD - gastric ulcer   Consults  GI   Medications  Scheduled Meds: . carbidopa-levodopa  1 tablet Oral QHS  . carbidopa-levodopa  1.5 tablet Oral QID  . furosemide  20 mg Intravenous Once  . furosemide  40 mg Oral Daily  . pantoprazole  40 mg Oral BID  . pramipexole  1.5 mg Oral TID   Continuous Infusions:  PRN Meds:.acetaminophen, albuterol, ondansetron (ZOFRAN) IV  DVT  Prophylaxis    SCDs    Lab Results  Component Value Date   PLT 116* 10/02/2013    Antibiotics     Anti-infectives   None          Subjective:   [name] today has, No headache, No chest pain, No abdominal pain - No Nausea, No new weakness tingling or numbness, No Cough - SOB. Generalized weakness and deconditioning  Objective:   Filed Vitals:   10/02/13 1345 10/02/13 1350 10/02/13 2245 10/03/13 0559  BP:   115/49 109/39  Pulse:   79 79  Temp:   98.3 F (36.8 C) 97.6 F (36.4 C)  TempSrc:   Oral Oral  Resp:   18 18  Height:      Weight:    81.829 kg (180 lb 6.4 oz)  SpO2: 89% 91% 99% 97%    Wt Readings from Last 3 Encounters:  10/03/13 81.829  kg (180 lb 6.4 oz)  10/03/13 81.829 kg (180 lb 6.4 oz)  09/29/13 81.647 kg (180 lb)     Intake/Output Summary (Last 24 hours) at 10/03/13 0919 Last data filed at 10/02/13 2200  Gross per 24 hour  Intake    995 ml  Output      0 ml  Net    995 ml    Exam Awake Alert, Oriented X 3, No new F.N deficits, Normal affect, generalized severe tremors [place].AT,PERRAL Supple Neck,No JVD, No cervical lymphadenopathy appriciated.  Symmetrical Chest wall movement, Good air movement bilaterally, CTAB RRR,No Gallops,Rubs or new Murmurs, No Parasternal Heave +ve B.Sounds, Abd Soft, Non tender, No organomegaly appriciated, No rebound - guarding or rigidity. No Cyanosis, Clubbing or edema, No new Rash or bruise      Data Review   Micro Results Recent Results (from the past 240 hour(s))  URINE CULTURE     Status: None   Collection Time    09/29/13  3:50 PM      Result Value Range Status   Specimen Description URINE, RANDOM   Final   Special Requests NONE   Final   Culture  Setup Time     Final   Value: 09/30/2013 00:42     Performed at [facility] Count     Final   Value: 20,OOO COLONIES/ML     Performed at [facility]   Culture     Final   Value: ESCHERICHIA COLI     Performed at [facility]   Report Status 10/01/2013 FINAL   Final   Organism ID, Bacteria ESCHERICHIA COLI   Final  MRSA PCR SCREENING     Status: None   Collection Time    09/29/13  6:58 PM      Result Value Range Status   MRSA by PCR NEGATIVE  NEGATIVE Final   Comment:            The GeneXpert MRSA Assay (FDA     approved for NASAL specimens     only), is one component of a     comprehensive MRSA colonization     surveillance program. It is not     intended to diagnose MRSA     infection nor to guide or     monitor treatment for     MRSA infections.    Radiology Reports No results found.  CBC  Recent Labs Lab 09/29/13 0923 09/29/13 1443  09/30/13 1500 09/30/13 1850 10/01/13 0045 10/01/13 0740 10/02/13 0422 10/02/13 1055  WBC 13.0* 12.2*  < > 9.9 10.4 9.2 11.0* 9.7  --   HGB 5.8* 5.5*  < > 7.7* 7.5* 7.3* 8.4* 7.7* 8.4*  HCT 18.4* 17.2*  < > 23.3* 22.8* 22.1* 25.5* 23.6* 26.3*  PLT  --  142*  < > 95* 103* 104* 122* 116*  --   MCV 78* 78.2  < > 82.0 82.6 82.8 81.5 82.8  --   MCH 24.5* 25.0*  < > 27.1 27.2 27.3 26.8 27.0  --   MCHC 31.5 32.0  < > 33.0 32.9 33.0 32.9 32.6  --   RDW 19.0* 19.3*  < > 17.0* 17.5* 17.8* 17.9* 18.3*  --   LYMPHSABS 1.2 1.3  --   --   --   --   --   --   --   MONOABS  --  0.7  --   --   --   --   --   --   --  EOSABS 0.0 0.1  --   --   --   --   --   --   --   BASOSABS 0.1 0.0  --   --   --   --   --   --   --   < > = values in this interval not displayed.  Chemistries   Recent Labs Lab 09/29/13 0923 09/29/13 1443 09/30/13 0655 10/02/13 0422  NA 140 143 141 139  K 4.4 4.7 3.8 4.0  CL 104 107 108 103  CO2 21 23 23 29   GLUCOSE 112* 94 93 109*  BUN 40* 43* 27* 13  CREATININE 0.99 0.83 0.70 0.80  CALCIUM 8.8 8.2* 7.5* 8.2*  AST 13  --  12  --   ALT 5  --  <5  --   ALKPHOS 52  --  45  --   BILITOT 0.3  --  0.7  --    ------------------------------------------------------------------------------------------------------------------ estimated  creatinine clearance is 64.8 ml/min (by C-G formula based on Cr of 0.8). ------------------------------------------------------------------------------------------------------------------ No results found for this basename: HGBA1C,  in the last 72 hours ------------------------------------------------------------------------------------------------------------------ No results found for this basename: CHOL, HDL, LDLCALC, TRIG, CHOLHDL, LDLDIRECT,  in the last 72 hours ------------------------------------------------------------------------------------------------------------------ No results found for this basename: TSH, T4TOTAL, FREET3, T3FREE, THYROIDAB,  in the last 72 hours ------------------------------------------------------------------------------------------------------------------ No results found for this basename: VITAMINB12, FOLATE, FERRITIN, TIBC, IRON, RETICCTPCT,  in the last 72 hours  Coagulation profile  Recent Labs Lab 09/29/13 1443  INR 1.27    No results found for this basename: DDIMER,  in the last 72 hours  Cardiac Enzymes No results found for this basename: CK, CKMB, TROPONINI, MYOGLOBIN,  in the last 168 hours ------------------------------------------------------------------------------------------------------------------ No components found with this basename: POCBNP,      Time Spent in minutes   35   Lala Lund K M.D on 10/03/2013 at 9:19 AM  Between 7am to 7pm - Pager - [phone]  After 7pm go to [email or web address] - password [email or web address]  And look for the night coverage person covering for me after hours  [facility] Office  [phone]

## 2013-10-04 DIAGNOSIS — G20 Parkinson's disease: Secondary | ICD-10-CM

## 2013-10-04 DIAGNOSIS — R5381 Other malaise: Secondary | ICD-10-CM

## 2013-10-04 LAB — HEMOGLOBIN AND HEMATOCRIT, BLOOD
HCT: 26.4 % — ABNORMAL LOW (ref 36.0–46.0)
HEMOGLOBIN: 8.4 g/dL — AB (ref 12.0–15.0)

## 2013-10-04 MED ORDER — DOCUSATE SODIUM 100 MG PO CAPS
200.0000 mg | ORAL_CAPSULE | Freq: Two times a day (BID) | ORAL | Status: DC
  Administered 2013-10-04 – 2013-10-05 (×3): 200 mg via ORAL
  Filled 2013-10-04 (×3): qty 2

## 2013-10-04 MED ORDER — BISACODYL 5 MG PO TBEC
10.0000 mg | DELAYED_RELEASE_TABLET | Freq: Every day | ORAL | Status: DC
  Administered 2013-10-04 – 2013-10-05 (×2): 10 mg via ORAL
  Filled 2013-10-04 (×2): qty 2

## 2013-10-04 NOTE — Progress Notes (Signed)
Physical Therapy Treatment Patient Details Name: [name] MRN: [id number] DOB: 1940/12/08 Today's Date: 10/04/2013 Time: 1010-1036 PT Time Calculation (min): 26 min  PT Assessment / Plan / Recommendation  History of Present Illness Pt. admitted with UGI bleed, ABLA, parkinsons, diastolic dysfunction, cor pulmonale.   PT Comments   Patient able to transfer to recliner today however very fearful of falling and required max encouragement to attempt SPT to [facility] and recliner. Continue to recommend comprehensive inpatient rehab (CIR) for post-acute therapy needs.   Follow Up Recommendations  CIR;Supervision/Assistance - 24 hour;Supervision for mobility/OOB     Does the patient have the potential to tolerate intense rehabilitation     Barriers to Discharge        Equipment Recommendations  Rolling walker with 5" wheels    Recommendations for Other Services    Frequency Min 3X/week   Progress towards PT Goals Progress towards PT goals: Progressing toward goals  Plan Current plan remains appropriate    Precautions / Restrictions Precautions Precautions: Fall Precaution Comments: reports fall  x 2 in November 14   Pertinent Vitals/Pain no apparent distress     Mobility  Bed Mobility Supine to sit: Mod assist General bed mobility comments: Mod A required to transition to sitting with A for LEs and trunk support. Use of pad to scoot EOB Transfers Overall transfer level: Needs assistance Transfers: Stand Pivot Transfers Sit to Stand: Mod assist Stand pivot transfers: Mod assist General transfer comment: Mod A to initiate stand and to position LEs in standing. Patient able to "hug" PTA with transfer. Patient very anxious and fearful of falling. Transferred bed >bsc>recliner    Exercises     PT Diagnosis:    PT Problem List:   PT Treatment Interventions:     PT Goals (current goals can now be found in the care plan section)    Visit Information  Last PT Received On:  10/04/13 Assistance Needed: +1 History of Present Illness: Pt. admitted with UGI bleed, ABLA, parkinsons, diastolic dysfunction, cor pulmonale.    Subjective Data      Cognition  Cognition Arousal/Alertness: Awake/alert Behavior During Therapy: WFL for tasks assessed/performed Overall Cognitive Status: Within Functional Limits for tasks assessed    Balance  Balance Sitting balance-Leahy Scale: Poor Standing balance-Leahy Scale: Poor  End of Session PT - End of Session Equipment Utilized During Treatment: Gait belt Activity Tolerance: Patient limited by fatigue;Other (comment) (fearful of falling;anxious) Patient left: in chair;with call bell/phone within reach   GP     Jacqualyn Posey 10/04/2013, 10:44 AM 10/04/2013 Jacqualyn Posey PTA [phone] pager [phone] office

## 2013-10-04 NOTE — Progress Notes (Signed)
Clinical Social Work Department BRIEF PSYCHOSOCIAL ASSESSMENT 10/04/2013  Patient:  [name]     Account Number:  [id number]     Admit date:  09/29/2013  Clinical Social Worker:  ,, CLINICAL SOCIAL WORKER  Date/Time:  10/04/2013 04:33 PM  Referred by:  Physician  Date Referred:  10/04/2013 Referred for  [name] Placement   Other Referral:   Interview type:  Patient Other interview type:    PSYCHOSOCIAL DATA Living Status:  ALONE Admitted from facility:   Level of care:   Primary support name:  [name]   [phone] Primary support relationship to patient:  FAMILY Degree of support available:   Pt has a good support system with 5 brothers and one sister.    CURRENT CONCERNS Current Concerns  Post-Acute Placement   Other Concerns:    SOCIAL WORK ASSESSMENT / PLAN CSW met with the Pt at the bedside after receiving a phone call from MD stating that Pt changed her mind about d/c'ing to home with HHPT. CSW introduced self and reason for visit. Pt was in agreement to speaking with CSW for d/c planning. Pt stated that she realizes that she needs to receive rehab and that she was hoping to go to CIR. Pt stated that she has previously been to [place] and would consider going back if she needed to. Pt stated that "her only fear is that she can not complete the rehab and will not get better." CSW encouraged that Pt to work as hard as she can physically do and that would help her get better faster. Pt agreed that she will be working with the PT therapists and that she "just wants to feel better."   Assessment/plan status:  Information/Referral to Community Resources Other assessment/ plan:   Information/referral to community resources:   CSW provided the Pt with a listing of [name] facilities in the [place] area.    PATIENT'S/FAMILY'S RESPONSE TO PLAN OF CARE: Pt is concerned about her recovery time and would like to get as well as possible so that she can "continue to  live on her own. CSW will work with Pt for d/c planning.         LCSWA  [name]  4N 1-16;  6N1-16 Phone: [phone]    

## 2013-10-04 NOTE — Evaluation (Signed)
Occupational Therapy Evaluation Patient Details Name: [name] MRN: [id number] DOB: 03-30-1941 Today's Date: 10/04/2013 Time: 4098-1191 OT Time Calculation (min): 15 min  OT Assessment / Plan / Recommendation History of present illness Pt. admitted with UGI bleed, ABLA, parkinsons, diastolic dysfunction, cor pulmonale.   Clinical Impression   This 73 yo female admitted with above presents to acute OT with fear of falling, shuffling sometimes halting gait due to Parkinsons, h/o a couple of falls since Nov, decreased standing and sitting balance all affecting pts ability to take care of herself at home. Will benefit from acute with follow up OT on CIR.    OT Assessment  Patient needs continued OT Services    Follow Up Recommendations  CIR    Barriers to Discharge Decreased caregiver support    Equipment Recommendations  Other (comment) (TBD next venue)       Frequency  Min 2X/week    Precautions / Restrictions Precautions Precautions: Fall Precaution Comments: reports fall  x 2 in November 14 Restrictions Weight Bearing Restrictions: No       ADL  Eating/Feeding: Independent Where Assessed - Eating/Feeding: Bed level Grooming: Set up;Supervision/safety Where Assessed - Grooming: Supported sitting Upper Body Bathing: Set up;Supervision/safety Where Assessed - Upper Body Bathing: Supported standing Lower Body Bathing: Maximal assistance Where Assessed - Lower Body Bathing: Supported sit to stand Upper Body Dressing: Minimal assistance Where Assessed - Upper Body Dressing: Supported sitting Lower Body Dressing: +1 Total assistance Where Assessed - Lower Body Dressing: Supported sit to Lobbyist: Moderate assistance Toilet Transfer Method: Sit to Loss adjuster, chartered:  (Sit<> stand from bed) Toileting - Clothing Manipulation and Hygiene: Maximal assistance Where Assessed - Toileting Clothing Manipulation and Hygiene: Standing Equipment Used:  Gait belt Transfers/Ambulation Related to ADLs: Mod A sit<>stand from bed--tends to mildly posterior lean and needs VCs to stay forward    OT Diagnosis: Generalized weakness  OT Problem List: Decreased strength;Impaired balance (sitting and/or standing);Decreased knowledge of use of DME or AE;Obesity (fear of fallling) OT Treatment Interventions: Self-care/ADL training;DME and/or AE instruction;Therapeutic activities;Patient/family education;Balance training   OT Goals(Current goals can be found in the care plan section) Acute Rehab OT Goals Patient Stated Goal: rehab then home OT Goal Formulation: With patient Time For Goal Achievement: 10/11/13 Potential to Achieve Goals: Good  Visit Information  Last OT Received On: 10/04/13 Assistance Needed: +1 History of Present Illness: Pt. admitted with UGI bleed, ABLA, parkinsons, diastolic dysfunction, cor pulmonale.       Prior [place] expects to be discharged to:: Private residence Living Arrangements: Alone Available Help at Discharge: Family;Other (Comment);Available PRN/intermittently (3 local brothers) Type of Home: House Home Access: Ramped entrance Home Layout: One level Home Equipment: Walker - 4 wheels;Bedside commode;Tub bench Additional Comments: pt lives alone, brothers get her groceries for her, she has Meals on Wheels for lunch and one of her brothers brings her supper. Prior Function Level of Independence: Independent with assistive device(s) Comments: Says she has only been doing sponge baths since her HHAide was discontinued in Dec. Communication Communication: No difficulties Dominant Hand: Right         Vision/Perception Vision - History Baseline Vision: Wears glasses only for reading Patient Visual Report: No change from baseline   Cognition  Cognition Arousal/Alertness: Awake/alert Behavior During Therapy: WFL for tasks assessed/performed Overall Cognitive Status:  Within Functional Limits for tasks assessed    Extremity/Trunk Assessment Upper Extremity Assessment Upper Extremity Assessment: Generalized weakness  Mobility Bed Mobility Overal bed mobility: +2 for physical assistance Bed Mobility: Sit to Supine Supine to sit: Mod assist Sit to supine: +2 for physical assistance;Mod assist General bed mobility comments: Mod A required to transition to sitting with A for LEs and trunk support. Use of pad to scoot EOB Transfers Overall transfer level: Needs assistance Equipment used: 2 person hand held assist Transfers: Sit to/from Stand Sit to Stand: Mod assist Stand pivot transfers: Mod assist General transfer comment: VCs to stay forward for sit<>stand           End of Session OT - End of Session Equipment Utilized During Treatment: Gait belt Activity Tolerance: Patient tolerated treatment well Patient left: in bed;with call bell/phone within reach;with bed alarm set Nurse Communication:  (nursing and NT in room working on getting pt back to bed)       Almon Register [phone] 10/04/2013, 1:58 PM

## 2013-10-04 NOTE — Consult Note (Signed)
Physical Medicine and Rehabilitation Consult  Reason for Consult: Deconditioned  Referring Physician: Dr. Candiss Norse  HPI: [name] is a 73 y.o. female with history of Parkinson's disease, HTN, CHF; who was admitted from PCP office on 09/29/13 with reports of BRBPR, dizziness and hgb 5.8. Patient with prior hx of GIB and stomach ulcers in 2011. She was transfused with 3 units PRBC and made NPO per Dr. Ardis Hughs input. EGD done on 09/30/13 revealing medium to large HH, ulcer treated with  Epi and coagulation as well as pan gastritis.  Melena resolved and patient tolerating regular diet. H/H stable. PT evaluation done and CIR recommended due to deconditioned state.    Review of Systems  Constitutional: Positive for malaise/fatigue.  HENT: Negative for hearing loss.   Eyes: Negative for blurred vision.  Respiratory: Shortness of breath: chronic.   Cardiovascular: Negative for chest pain and palpitations.  Gastrointestinal: Positive for constipation. Negative for heartburn and nausea.  Musculoskeletal: Negative for back pain and neck pain.  Neurological: Positive for dizziness and weakness. Negative for headaches.    Past Medical History  Diagnosis Date  . Parkinson's disease   . Hypertension   . Arthritis   . Anemia   . Cellulitis of lower leg 12/24/2011  . Acute bronchitis   . Other abnormal blood chemistry   . Chronic systolic heart failure   . Acute on chronic systolic heart failure   . Cellulitis and abscess of leg, except foot   . Hypotension, unspecified   . Helicobacter pylori (H. pylori)   . Acute gastric ulcer with hemorrhage, without mention of obstruction     Past Surgical History  Procedure Laterality Date  . Hip arthroscopy w/ labral repair    . Thyroidectomy    . Esophagogastroduodenoscopy  09/26/2011    Procedure: ESOPHAGOGASTRODUODENOSCOPY (EGD);  Surgeon: Zenovia Jarred, MD;  Location: [facility] ENDOSCOPY;  Service: Gastroenterology;  Laterality: N/A;  To be done at bedside.  .  Esophagogastroduodenoscopy N/A 09/30/2013    Procedure: ESOPHAGOGASTRODUODENOSCOPY (EGD);  Surgeon: [name], MD;  Location: [place];  Service: Endoscopy;  Laterality: N/A;    Family History  Problem Relation Age of Onset  . GER disease Mother   . Heart disease Father     Social History:   Lives alone. Has meals on wheels and nephew/neice help with evening meals and home management. Uses walker for mobility--sedentary--limited by SOB. She reports that she has never smoked. She has never used smokeless tobacco. She reports that she does not drink alcohol or use illicit drugs.   Allergies  Allergen Reactions  . Codeine Other (See Comments)    unknown  . Penicillins Other (See Comments)    unknown   Medications Prior to Admission  Medication Dose Route Frequency Provider Last Rate Last Dose  . ipratropium-albuterol (DUONEB) 0.5-2.5 (3) MG/3ML nebulizer solution 3 mL  3 mL Nebulization Q4H [name] L Reed, DO       Medications Prior to Admission  Medication Sig Dispense Refill  . albuterol (PROVENTIL) (2.5 MG/3ML) 0.083% nebulizer solution Take 2.5 mg by nebulization every 6 (six) hours as needed. For shortness of breath      . b complex vitamins tablet Take 1 tablet by mouth daily.        . carbidopa-levodopa (SINEMET CR) 50-200 MG per tablet Take 1 tablet by mouth at bedtime. 10pm  30 tablet  12  . carbidopa-levodopa (SINEMET IR) 25-100 MG per tablet Take 1.5 tablets by mouth 4 (four) times daily.  6am, 10am, 2pm and 6pm  540 tablet  1  . furosemide (LASIX) 20 MG tablet Take 20 mg by mouth 2 (two) times daily. Sometimes she takes once daily, sometimes twice daily      . magnesium oxide (MAG-OX) 400 MG tablet Take 400 mg by mouth daily.       . pramipexole (MIRAPEX) 1.5 MG tablet Take 1 tablet (1.5 mg total) by mouth 3 (three) times daily.  270 tablet  3  . triamcinolone cream (KENALOG) 0.1 % Apply 1 application topically daily. Applied to legs      . [DISCONTINUED]  pantoprazole (PROTONIX) 40 MG tablet Take 1 tablet (40 mg total) by mouth 2 (two) times daily.  60 tablet  3    Home: Home Living Family/patient expects to be discharged to:: Private residence Living Arrangements: Alone Available Help at Discharge: Family;Other (Comment);Available PRN/intermittently (3 local brothers) Type of Home: House Home Access: Ramped entrance Home Layout: One level Home Equipment: Walker - 4 wheels;Bedside commode;Tub bench  Functional History: Prior Function Comments: Pt. reports she receives mobile meals and brother brings nightly meal Functional Status:  Mobility:          ADL:    Cognition: Cognition Overall Cognitive Status: Within Functional Limits for tasks assessed Orientation Level: Oriented X4 Cognition Arousal/Alertness: Awake/alert Behavior During Therapy: WFL for tasks assessed/performed Overall Cognitive Status: Within Functional Limits for tasks assessed  Blood pressure 117/72, pulse 80, temperature 98.3 F (36.8 C), temperature source Oral, resp. rate 18, height  (1.626 m), weight 80.876 kg (178 lb 4.8 oz), SpO2 95.00%. Physical Exam  Nursing note and vitals reviewed. Constitutional: She is oriented to person, place, and time. She appears well-developed and well-nourished.  HENT:  Head: Normocephalic and atraumatic.  Constant dystonic head movements.   Eyes: Conjunctivae and EOM are normal. Pupils are equal, round, and reactive to light.  Neck: Normal range of motion. Neck supple. No JVD present. No tracheal deviation present. No thyromegaly present.  Cardiovascular: Normal rate, regular rhythm and normal heart sounds.  Exam reveals no friction rub.   No murmur heard. Respiratory: Effort normal. No respiratory distress (inspiratory/ expiratory). She has wheezes. She exhibits no tenderness.  GI: Soft. Bowel sounds are normal. She exhibits no distension. There is no tenderness.  Musculoskeletal: She exhibits no edema and no  tenderness.  Lymphadenopathy:    She has no cervical adenopathy.  Neurological: She is alert and oriented to person, place, and time. She displays normal reflexes. Coordination abnormal.  Ataxic speech--SOB with conversation. Intermittent BUE and LUE tremors noted, low frequency. No rigidity. Fair insight and awareness. Memory good. Strength 4/5 UE deltoid, bicep, tricep, HI. LE 3+ HF,4- KE, 4/5 ankles. No gross sensory deficits.   Skin: Skin is warm and dry.  Psychiatric: She has a normal mood and affect. Her behavior is normal. Judgment and thought content normal.    No results found for this or any previous visit (from the past 24 hour(s)). No results found.  Assessment/Plan: Diagnosis: gait disorder, parkinson's disease compounded by ABLA, recent hospital course 1. Does the need for close, 24 hr/day medical supervision in concert with the patient's rehab needs make it unreasonable for this patient to be served in a less intensive setting? Yes 2. Co-Morbidities requiring supervision/potential complications: htn, CHF, hypotension, gastric ulcer, esophagitis 3. Due to bladder management, bowel management, safety, skin/wound care, disease management, medication administration, pain management and patient education, does the patient require 24 hr/day rehab nursing? Yes 4. Does the  patient require coordinated care of a physician, rehab nurse, PT (1-2 hrs/day, 5 days/week) and OT (1-2 hrs/day, 5 days/week) to address physical and functional deficits in the context of the above medical diagnosis(es)? Yes Addressing deficits in the following areas: balance, endurance, locomotion, strength, transferring, bowel/bladder control, bathing, dressing, feeding, grooming, toileting, cognition and psychosocial support 5. Can the patient actively participate in an intensive therapy program of at least 3 hrs of therapy per day at least 5 days per week? Yes 6. The potential for patient to make measurable gains  while on inpatient rehab is good 7. Anticipated functional outcomes upon discharge from inpatient rehab are mod I to intermittent supervision with PT, mod I to intermittent supervision with OT, n/a with SLP. 8. Estimated rehab length of stay to reach the above functional goals is: 10-14 days 9. Does the patient have adequate social supports to accommodate these discharge functional goals? Potentially 10. Anticipated D/C setting: Home 11. Anticipated post D/C treatments: [facility] therapy 12. Overall Rehab/Functional Prognosis: good  RECOMMENDATIONS: This patient's condition is appropriate for continued rehabilitative care in the following setting: CIR Patient has agreed to participate in recommended program. Yes Note that insurance prior authorization may be required for reimbursement for recommended care.  Comment: Pt could benefit from CIR. However, there probably needs to be more discussion regarding long term living arrangements.   [name], MD, [facility]     10/04/2013

## 2013-10-04 NOTE — Progress Notes (Signed)
CSW received referral for [name].   CSW reviewed chart and per D/C summary Pt to d/c to home with HHPT.   No further needs at this time. Please re consult if additional needs arise.    CSW signing off.     [facility]  4N 1-16;  [phone] Phone: [phone]

## 2013-10-05 ENCOUNTER — Inpatient Hospital Stay (HOSPITAL_COMMUNITY)
Admission: RE | Admit: 2013-10-05 | Discharge: 2013-10-16 | DRG: 945 | Disposition: A | Discharge status: Home Health | Payer: Medicare Other | Source: Intra-hospital | Attending: Physical Medicine & Rehabilitation | Admitting: Physical Medicine & Rehabilitation

## 2013-10-05 DIAGNOSIS — D62 Acute posthemorrhagic anemia: Secondary | ICD-10-CM | POA: Diagnosis present

## 2013-10-05 DIAGNOSIS — K250 Acute gastric ulcer with hemorrhage: Secondary | ICD-10-CM | POA: Diagnosis present

## 2013-10-05 DIAGNOSIS — G20 Parkinson's disease: Secondary | ICD-10-CM | POA: Diagnosis not present

## 2013-10-05 DIAGNOSIS — K922 Gastrointestinal hemorrhage, unspecified: Secondary | ICD-10-CM

## 2013-10-05 DIAGNOSIS — I10 Essential (primary) hypertension: Secondary | ICD-10-CM | POA: Diagnosis present

## 2013-10-05 DIAGNOSIS — I5022 Chronic systolic (congestive) heart failure: Secondary | ICD-10-CM | POA: Diagnosis present

## 2013-10-05 DIAGNOSIS — Z5189 Encounter for other specified aftercare: Secondary | ICD-10-CM | POA: Diagnosis not present

## 2013-10-05 DIAGNOSIS — G20A1 Parkinson's disease without dyskinesia, without mention of fluctuations: Secondary | ICD-10-CM | POA: Diagnosis not present

## 2013-10-05 DIAGNOSIS — R5381 Other malaise: Secondary | ICD-10-CM | POA: Diagnosis not present

## 2013-10-05 DIAGNOSIS — Z96649 Presence of unspecified artificial hip joint: Secondary | ICD-10-CM | POA: Diagnosis not present

## 2013-10-05 DIAGNOSIS — I509 Heart failure, unspecified: Secondary | ICD-10-CM | POA: Diagnosis present

## 2013-10-05 DIAGNOSIS — M129 Arthropathy, unspecified: Secondary | ICD-10-CM | POA: Diagnosis present

## 2013-10-05 MED ORDER — FUROSEMIDE 40 MG PO TABS
40.0000 mg | ORAL_TABLET | Freq: Every day | ORAL | Status: DC
  Administered 2013-10-06 – 2013-10-16 (×11): 40 mg via ORAL
  Filled 2013-10-05 (×12): qty 1

## 2013-10-05 MED ORDER — PANTOPRAZOLE SODIUM 40 MG PO TBEC
40.0000 mg | DELAYED_RELEASE_TABLET | Freq: Two times a day (BID) | ORAL | Status: DC
  Administered 2013-10-05 – 2013-10-16 (×22): 40 mg via ORAL
  Filled 2013-10-05 (×26): qty 1

## 2013-10-05 MED ORDER — ONDANSETRON HCL 4 MG/2ML IJ SOLN: 4.0000 mg | Freq: Four times a day (QID) | INTRAMUSCULAR | Status: DC | PRN

## 2013-10-05 MED ORDER — DIPHENHYDRAMINE HCL 12.5 MG/5ML PO ELIX
12.5000 mg | ORAL_SOLUTION | Freq: Four times a day (QID) | ORAL | Status: DC | PRN
  Filled 2013-10-05: qty 10

## 2013-10-05 MED ORDER — BISACODYL 10 MG RE SUPP
10.0000 mg | Freq: Every day | RECTAL | Status: DC | PRN
  Administered 2013-10-08 – 2013-10-16 (×2): 10 mg via RECTAL
  Filled 2013-10-05 (×2): qty 1

## 2013-10-05 MED ORDER — DOCUSATE SODIUM 100 MG PO CAPS
200.0000 mg | ORAL_CAPSULE | Freq: Two times a day (BID) | ORAL | Status: DC
  Administered 2013-10-05 – 2013-10-16 (×22): 200 mg via ORAL
  Filled 2013-10-05 (×24): qty 2

## 2013-10-05 MED ORDER — GUAIFENESIN-DM 100-10 MG/5ML PO SYRP
5.0000 mL | ORAL_SOLUTION | Freq: Four times a day (QID) | ORAL | Status: DC | PRN
  Administered 2013-10-07 – 2013-10-08 (×2): 10 mL via ORAL
  Administered 2013-10-10 – 2013-10-11 (×2): 5 mL via ORAL
  Administered 2013-10-12: 10 mL via ORAL
  Administered 2013-10-13: 5 mL via ORAL
  Administered 2013-10-13: 10 mL via ORAL
  Filled 2013-10-05 (×5): qty 10
  Filled 2013-10-05: qty 5

## 2013-10-05 MED ORDER — ONDANSETRON HCL 4 MG PO TABS
4.0000 mg | ORAL_TABLET | Freq: Four times a day (QID) | ORAL | Status: DC | PRN
  Administered 2013-10-14: 4 mg via ORAL
  Filled 2013-10-05: qty 1

## 2013-10-05 MED ORDER — CARBIDOPA-LEVODOPA ER 50-200 MG PO TBCR
1.0000 | EXTENDED_RELEASE_TABLET | Freq: Every day | ORAL | Status: DC
  Administered 2013-10-05 – 2013-10-15 (×11): 1 via ORAL
  Filled 2013-10-05 (×13): qty 1

## 2013-10-05 MED ORDER — ACETAMINOPHEN 325 MG PO TABS: 325.0000 mg | ORAL_TABLET | ORAL | Status: DC | PRN

## 2013-10-05 MED ORDER — ALUM & MAG HYDROXIDE-SIMETH 200-200-20 MG/5ML PO SUSP: 30.0000 mL | ORAL | Status: DC | PRN

## 2013-10-05 MED ORDER — ALBUTEROL SULFATE (2.5 MG/3ML) 0.083% IN NEBU: 2.5000 mg | INHALATION_SOLUTION | RESPIRATORY_TRACT | Status: DC | PRN

## 2013-10-05 MED ORDER — CARBIDOPA-LEVODOPA 25-100 MG PO TABS
1.5000 | ORAL_TABLET | Freq: Four times a day (QID) | ORAL | Status: DC
  Administered 2013-10-05 – 2013-10-16 (×42): 1.5 via ORAL
  Filled 2013-10-05 (×46): qty 1.5

## 2013-10-05 MED ORDER — SENNOSIDES-DOCUSATE SODIUM 8.6-50 MG PO TABS
1.0000 | ORAL_TABLET | Freq: Every evening | ORAL | Status: DC | PRN
  Administered 2013-10-07 – 2013-10-15 (×2): 1 via ORAL
  Filled 2013-10-05 (×2): qty 1

## 2013-10-05 MED ORDER — FLEET ENEMA 7-19 GM/118ML RE ENEM: 1.0000 | ENEMA | Freq: Once | RECTAL | Status: AC | PRN

## 2013-10-05 MED ORDER — PRAMIPEXOLE DIHYDROCHLORIDE 1.5 MG PO TABS
1.5000 mg | ORAL_TABLET | Freq: Three times a day (TID) | ORAL | Status: DC
  Administered 2013-10-05 – 2013-10-06 (×4): 1.5 mg via ORAL
  Filled 2013-10-05 (×8): qty 1

## 2013-10-05 NOTE — Progress Notes (Signed)
I met with pt at bedside. Discussed inpt rehab venue for her recovery. She is in agreement. I will contact Dr. Candiss Norse and arrange for today. [phone]

## 2013-10-05 NOTE — Progress Notes (Signed)
Report called to nurse Ed on 4W.  Pt. Transferred to 4W.  IV removed.  AVS gone over with pt. And given to pt.  Pt. Expressed understanding.   Syliva Overman

## 2013-10-05 NOTE — PMR Pre-admission (Signed)
PMR Admission Coordinator Pre-Admission Assessment  Patient: [name] is an 73 y.o., female MRN: [id number] DOB: 11-07-1940 Height: 5\' 4"  (162.6 cm) Weight: 82.056 kg (180 lb 14.4 oz)              Insurance Information HMO:     PPO:      PCP:      IPA:      80/20: yes     OTHER: no HMO PRIMARY: Medicare a and b      Policy#: [id number]      Subscriber: pt Benefits:  Phone #: online     Name: 10/04/13 Eff. Date: 05/24/05     Deduct: $1260      Out of Pocket Max: none      Life Max: none CIR: 100%      [name]: 20 full days Outpatient: 80%     Co-Pay: 20% Home Health: 100%      Co-Pay: none DME: 80%     Co-Pay: 20% Providers: pt choice  SECONDARY: BCBS of [place] supplement      Policy#: [id number]      Subscriber: pt  Medicaid Application Date:       Case Manager:  Disability Application Date:       Case Worker:   Emergency Contact Information Contact Information   Name Relation Home Work Mobile   Fick,William [place]) Brother [phone]     Raihana, [place]  [phone]  [phone]     Current Medical History  Patient Admitting Diagnosis: gait disorder, parkinson's disease compounded by ABLA, recent hospital course  History of Present Illness: HPI: HAU DANNENBERG is a 73 y.o. female with history of Parkinson's disease, HTN, CHF; who was admitted from PCP office on 09/29/13 with reports of BRBPR, dizziness and hgb 5.8. Patient with prior hx of GIB and stomach ulcers in 2011. She was transfused with 3 units PRBC and made NPO per Dr. Ardis Hughs input. EGD done on 09/30/13 revealing medium to large HH, ulcer treated with Epi and coagulation as well as pan gastritis. Melena resolved and patient tolerating regular diet. H/H stable.  Past Medical History  Past Medical History  Diagnosis Date  . Parkinson's disease   . Hypertension   . Arthritis   . Anemia   . Cellulitis of lower leg 12/24/2011  . Acute bronchitis   . Other abnormal blood chemistry   . Chronic systolic heart failure   .  Acute on chronic systolic heart failure   . Cellulitis and abscess of leg, except foot   . Hypotension, unspecified   . Helicobacter pylori (H. pylori)   . Acute gastric ulcer with hemorrhage, without mention of obstruction     Family History  family history includes GER disease in her mother; Heart disease in her father.  Prior Rehab/Hospitalizations: [name] 3 yrs ago after hip fx. Stayed 2 months   Current Medications  Current facility-administered medications:acetaminophen (TYLENOL) tablet 650 mg, 650 mg, Oral, Q6H PRN, Jonetta Osgood, MD, 650 mg at 10/04/13 1947;  albuterol (PROVENTIL) (2.5 MG/3ML) 0.083% nebulizer solution 2.5 mg, 2.5 mg, Nebulization, Q2H PRN, Jonetta Osgood, MD, 2.5 mg at 10/05/13 1009;  bisacodyl (DULCOLAX) EC tablet 10 mg, 10 mg, Oral, Daily, Thurnell Lose, MD, 10 mg at 10/05/13 0952 carbidopa-levodopa (SINEMET CR) 50-200 MG per tablet controlled release 1 tablet, 1 tablet, Oral, QHS, Jonetta Osgood, MD, 1 tablet at 10/04/13 2228;  carbidopa-levodopa (SINEMET IR) 25-100 MG per tablet immediate release 1.5 tablet, 1.5 tablet,  Oral, QID, Jonetta Osgood, MD, 1.5 tablet at 10/05/13 4742;  docusate sodium (COLACE) capsule 200 mg, 200 mg, Oral, BID, Thurnell Lose, MD, 200 mg at 10/05/13 0951 furosemide (LASIX) injection 20 mg, 20 mg, Intravenous, Once, Jonetta Osgood, MD;  furosemide (LASIX) tablet 40 mg, 40 mg, Oral, Daily, Thurnell Lose, MD, 40 mg at 10/05/13 5956;  ondansetron [facility]) injection 4 mg, 4 mg, Intravenous, Q6H PRN, Jonetta Osgood, MD, 4 mg at 10/03/13 1618;  pantoprazole (PROTONIX) EC tablet 40 mg, 40 mg, Oral, BID, Thurnell Lose, MD, 40 mg at 10/05/13 3875 pramipexole (MIRAPEX) tablet 1.5 mg, 1.5 mg, Oral, TID, Jonetta Osgood, MD, 1.5 mg at 10/05/13 6433  Patients Current Diet: Cardiac  Precautions / Restrictions Precautions Precautions: Fall Precaution Comments: reports fall  x 2 in November  14 Restrictions Weight Bearing Restrictions: No   Prior Activity Level Household: Church weekly by [employer] [place] Devices/Equipment: Environmental consultant (specify type) Home Equipment: Walker - 4 wheels;Bedside commode;Tub bench  Prior Functional Level Prior Function Level of Independence: Independent with assistive device(s) Comments: sponge bathes much of the time; has asked Health Dept for aide to help with adls; on waiting list  Current Functional Level Cognition  Overall Cognitive Status: Within Functional Limits for tasks assessed Orientation Level: Oriented X4    Extremity Assessment (includes Sensation/Coordination)          ADLs  Eating/Feeding: Independent Where Assessed - Eating/Feeding: Bed level Grooming: Set up;Supervision/safety Where Assessed - Grooming: Supported sitting Upper Body Bathing: Set up;Supervision/safety Where Assessed - Upper Body Bathing: Supported standing Lower Body Bathing: Maximal assistance Where Assessed - Lower Body Bathing: Supported sit to stand Upper Body Dressing: Minimal assistance Where Assessed - Upper Body Dressing: Supported sitting Lower Body Dressing: +1 Total assistance Where Assessed - Lower Body Dressing: Supported sit to Lobbyist: Moderate assistance Toilet Transfer Method: Sit to Loss adjuster, chartered:  (Sit<> stand from bed) Toileting - Clothing Manipulation and Hygiene: Maximal assistance Where Assessed - Toileting Clothing Manipulation and Hygiene: Standing Equipment Used: Gait belt Transfers/Ambulation Related to ADLs: Mod A sit<>stand from bed--tends to mildly posterior lean and needs VCs to stay forward    Mobility    10/04/13 Patient able to transfer to recliner today however very fearful of falling and required max encouragement to attempt SPT to [facility] and recliner  Bed Mobility  Supine to sit: Mod assist  General bed mobility comments: Mod A  required to transition to sitting with A for LEs and trunk support. Use of pad to scoot EOB  Transfers  Overall transfer level: Needs assistance  Transfers: Stand Pivot Transfers  Sit to Stand: Mod assist  Stand pivot transfers: Mod assist  General transfer comment: Mod A to initiate stand and to position LEs in standing. Patient able to "hug" PTA with transfer. Patient very anxious and fearful of falling. Transferred bed >bsc>recliner    Transfers       Ambulation / Gait / Stairs / Wheelchair Mobility       Posture / Balance      Special needs/care consideration  Bowel mgmt:typically continent pta. Using bedpan in hospital; Bladder mgmt: typically continent except when taking diuretic.    Previous Home Environment Living Arrangements: Alone Available Help at Discharge: Family;Available PRN/intermittently Type of Home: House Home Layout: One level Home Access: Ramped entrance Bathroom Shower/Tub: Tub/shower unit Bathroom Toilet: Standard Bathroom Accessibility: Yes Home Care Services: No Additional Comments:  pt lives alone, brothers get her groceries for her, she has Meals on Wheels for lunch and one of her brothers brings her supper.  Discharge Living Setting Plans for Discharge Living Setting: Patient's home;Alone Type of Home at Discharge: House Discharge Home Layout: One level Discharge Home Access: [place] entrance Discharge Bathroom Shower/Tub: [place] unit Discharge Bathroom Toilet: Standard Discharge Bathroom Accessibility: Yes How Accessible: Accessible via walker Does the patient have any problems obtaining your medications?: No  Social/Family/Support Systems Contact Information: Brothers, [name] and [name]. Mainly [name] Anticipated Caregiver: Brothers prn with meals, groceries, etc. No 24/7 Anticipated Caregiver's Contact Information: see above Ability/Limitations of Caregiver: brothers work but daily check in with her and provide assistance Caregiver  Availability: Intermittent Discharge Plan Discussed with Primary Caregiver: Yes Is Caregiver In Agreement with Plan?: Yes Does Caregiver/Family have Issues with Lodging/Transportation while Pt is in Rehab?: No    Goals/Additional Needs Patient/Family Goal for Rehab: Mod I to intermittent supervision with PT and OT Expected length of stay: ELOS 10 to 14 days Equipment Needs: Uses [facility] transportation for door to door transport. RW and then lift to get into [place] Special Service Needs: Pt has contacted Health dept and is on waiting list for aide to assist with adls Additional Information: I discussed with pt that there will likely be a time that she will need more assistance than she has at home,.She states she would then got to ALF or [name] Pt/Family Agrees to Admission and willing to participate: Yes Program Orientation Provided & Reviewed with Pt/Caregiver Including Roles  & Responsibilities: Yes   Decrease burden of Care through IP rehab admission: n/a  Possible need for [name] placement upon discharge:not expected but discussed with pt that if she did not reach Mod I to intermittent supervision goals, that we would then recommend [name] rather than direct d/c home.  Patient Condition: This patient's condition remains as documented in the consult dated 10/04/13, in which the Rehabilitation Physician determined and documented that the patient's condition is appropriate for intensive rehabilitative care in an inpatient rehabilitation facility. Will admit to inpatient rehab today.  Preadmission Screen Completed By:  Cleatrice Burke, 10/05/2013 10:09 AM ______________________________________________________________________   Discussed status with Dr. Naaman Plummer on 10/05/13 at  59 and received telephone approval for admission today.  Admission Coordinator:  Cleatrice Burke, time 1009 Date 10/05/13.

## 2013-10-05 NOTE — Progress Notes (Signed)
Patient is ready for discharge, was seen today is in no distress has no subjective complaints feeling well. Vital signs stable.  Summary done yesterday await placement.

## 2013-10-05 NOTE — Progress Notes (Signed)
Patient information reviewed and entered into eRehab system by Mackinley Kiehn, RN, CRRN, PPS Coordinator.  Information including medical coding and functional independence measure will be reviewed and updated through discharge.     Per nursing patient was given "Data Collection Information Summary for Patients in Inpatient Rehabilitation Facilities with attached "Privacy Act Statement-Health Care Records" upon admission.  

## 2013-10-05 NOTE — H&P (Signed)
Physical Medicine and Rehabilitation Admission H&P  Chief Complaint   Patient presents with   .  Gait disorder due to Parkinson's disease compounded by ABLA due to GIB   HPI: [name] is a 73 y.o. female with history of Parkinson's disease, HTN, CHF; who was admitted from PCP office on 09/29/13 with reports of BRBPR, dizziness and hgb 5.8. Patient with prior hx of GIB and stomach ulcers in 2011. She was transfused with 3 units PRBC and made NPO per Dr. Ardis Hughs input. EGD done on 09/30/13 revealing medium to large HH, ulcer treated with Epi and coagulation as well as pan gastritis. Melena resolved and patient tolerating regular diet. H/H stable. Has required IV lasix intermittent for fluid overload issues. Therapies initiated and CIR recommended due to deconditioned state. Patient admitted today per rehab team recommendations.    Review of Systems  HENT: Negative for hearing loss.  Eyes: Negative for blurred vision and double vision.  Respiratory:Occasional cough, shortness of breath and wheezing.  Cardiovascular: Negative for chest pain and palpitations.  Genitourinary: Positive for urgency and frequency.  Musculoskeletal: Negative for myalgias.  Neurological: Negative for headaches.   Past Medical History   Diagnosis  Date   .  Parkinson's disease    .  Hypertension    .  Arthritis    .  Anemia    .  Cellulitis of lower leg  12/24/2011   .  Acute bronchitis    .  Other abnormal blood chemistry    .  Chronic systolic heart failure    .  Acute on chronic systolic heart failure    .  Cellulitis and abscess of leg, except foot    .  Hypotension, unspecified    .  Helicobacter pylori (H. pylori)    .  Acute gastric ulcer with hemorrhage, without mention of obstruction     Past Surgical History   Procedure  Laterality  Date   .  Hip arthroscopy w/ labral repair     .  Thyroidectomy     .  Esophagogastroduodenoscopy   09/26/2011     Procedure: ESOPHAGOGASTRODUODENOSCOPY (EGD); Surgeon:  Zenovia Jarred, MD; Location: [facility] ENDOSCOPY; Service: Gastroenterology; Laterality: N/A; To be done at bedside.   .  Esophagogastroduodenoscopy  N/A  09/30/2013     Procedure: ESOPHAGOGASTRODUODENOSCOPY (EGD); Surgeon: [name], MD; Location: [place]; Service: Endoscopy; Laterality: N/A;    Family History   Problem  Relation  Age of Onset   .  GER disease  Mother    .  Heart disease  Father     Social History: reports that she has never smoked. She has never used smokeless tobacco. She reports that she does not drink alcohol or use illicit drugs.  Allergies:  Allergies   Allergen  Reactions   .  Codeine  Other (See Comments)     unknown   .  Penicillins  Other (See Comments)     unknown    Medications Prior to Admission   Medication  Dose  Route  Frequency  Provider  Last Rate  Last Dose   .  ipratropium-albuterol (DUONEB) 0.5-2.5 (3) MG/3ML nebulizer solution 3 mL  3 mL  Nebulization  Q4H  [name] L Reed, DO      Medications Prior to Admission   Medication  Sig  Dispense  Refill   .  albuterol (PROVENTIL) (2.5 MG/3ML) 0.083% nebulizer solution  Take 2.5 mg by nebulization every 6 (six) hours as needed. For  shortness of breath     .  b complex vitamins tablet  Take 1 tablet by mouth daily.     .  carbidopa-levodopa (SINEMET CR) 50-200 MG per tablet  Take 1 tablet by mouth at bedtime. 10pm  30 tablet  12   .  carbidopa-levodopa (SINEMET IR) 25-100 MG per tablet  Take 1.5 tablets by mouth 4 (four) times daily. 6am, 10am, 2pm and 6pm  540 tablet  1   .  furosemide (LASIX) 20 MG tablet  Take 20 mg by mouth 2 (two) times daily. Sometimes she takes once daily, sometimes twice daily     .  magnesium oxide (MAG-OX) 400 MG tablet  Take 400 mg by mouth daily.     .  pramipexole (MIRAPEX) 1.5 MG tablet  Take 1 tablet (1.5 mg total) by mouth 3 (three) times daily.  270 tablet  3   .  triamcinolone cream (KENALOG) 0.1 %  Apply 1 application topically daily. Applied to legs     .   [DISCONTINUED] pantoprazole (PROTONIX) 40 MG tablet  Take 1 tablet (40 mg total) by mouth 2 (two) times daily.  60 tablet  3    Home:  Home Living  Family/patient expects to be discharged to:: Private residence  Living Arrangements: Alone  Available Help at Discharge: Family;Available PRN/intermittently  Type of Home: House  Home Access: Ramped entrance  Home Layout: One level  Home Equipment: [place] - 4 wheels;Bedside commode;Tub bench  Additional Comments: pt lives alone, brothers get her groceries for her, she has Meals on Wheels for lunch and one of her brothers brings her supper.  Functional History:  Prior Function  Comments: sponge bathes much of the time; has asked Health Dept for aide to help with adls; on waiting list  Functional Status:  Mobility:      ADL:  ADL  Eating/Feeding: Independent  Where Assessed - Eating/Feeding: Bed level  Grooming: Set up;Supervision/safety  Where Assessed - Grooming: Supported sitting  Upper Body Bathing: Set up;Supervision/safety  Where Assessed - Upper Body Bathing: Supported standing  Lower Body Bathing: Maximal assistance  Where Assessed - Lower Body Bathing: Supported sit to stand  Upper Body Dressing: Minimal assistance  Where Assessed - Upper Body Dressing: Supported sitting  Lower Body Dressing: +1 Total assistance  Where Assessed - Lower Body Dressing: Supported sit to Retail buyer: Moderate assistance  Toilet Transfer Method: Sit to Surveyor, quantity: (Sit<> stand from bed)  Equipment Used: Gait belt  Transfers/Ambulation Related to ADLs: Mod A sit<>stand from bed--tends to mildly posterior lean and needs VCs to stay forward  Cognition:  Cognition  Overall Cognitive Status: Within Functional Limits for tasks assessed  Orientation Level: Oriented X4  Cognition  Arousal/Alertness: Awake/alert  Behavior During Therapy: WFL for tasks assessed/performed  Overall Cognitive Status: Within Functional  Limits for tasks assessed  Physical Exam:  Blood pressure 111/79, pulse 80, temperature 97.7 F (36.5 C), temperature source Oral, resp. rate 18, height 5\' 4"  (1.626 m), weight 82.056 kg (180 lb 14.4 oz), SpO2 96.00%.     .  Constitutional: She is oriented to person, place, and time. Appears a little disheveled  HENT: oral mucosa generally pink and moist Head: Normocephalic and atraumatic.  Eyes: Conjunctivae are normal. Pupils are equal, round, and reactive to light.  Neck: Normal range of motion. Neck supple.  Cardiovascular: Regular rhythm.  Respiratory: Effort normal and breath sounds normal. No respiratory distress. She has no wheezes or  rales. GI: Soft. Bowel sounds are normal. She exhibits no distension. There is no tenderness.  Musculoskeletal: She exhibits no edema and no tenderness.  Diffuse ecchymotic area along left forearm.  Neurological: She is alert and oriented to person, place, and time.  Ataxic speech--breath support better today. Persistent, intermittent BUE and LUE tremors, head bobbing movements, low frequency. No rigidity. Fair insight and awareness. Memory good. Strength remains 4/5 UE deltoid, bicep, tricep, HI. LE 3+ HF,4- to 4 KE, 4/5 ankle dorsi and plantar flexion. No gross sensory deficits  Skin: Skin is warm and dry.  Psychiatric: She has a normal mood and affect. Her behavior is normal. Thought content normal.   Results for orders placed during the hospital encounter of 09/29/13 (from the past 48 hour(s))   HEMOGLOBIN AND HEMATOCRIT, BLOOD Status: Abnormal    Collection Time    10/04/13 10:10 AM   Result  Value  Range    Hemoglobin  8.4 (*)  12.0 - 15.0 g/dL    HCT  26.4 (*)  36.0 - 46.0 %    No results found.  Post Admission Physician Evaluation:  1. Functional deficits secondary to deconditioning after multiple medical issues in a patient with pre-existing parkinson's (pseudoexacerbation). 2. Patient is admitted to receive collaborative,  interdisciplinary care between the physiatrist, rehab nursing staff, and therapy team. 3. Patient's level of medical complexity and substantial therapy needs in context of that medical necessity cannot be provided at a lesser intensity of care such as a [name]. 4. Patient has experienced substantial functional loss from his/her baseline which was documented above under the "Functional History" and "Functional Status" headings. Judging by the patient's diagnosis, physical exam, and functional history, the patient has potential for functional progress which will result in measurable gains while on inpatient rehab. These gains will be of substantial and practical use upon discharge in facilitating mobility and self-care at the household level. 5. Physiatrist will provide 24 hour management of medical needs as well as oversight of the therapy plan/treatment and provide guidance as appropriate regarding the interaction of the two. 6. 24 hour rehab nursing will assist with bladder management, bowel management, safety, skin/wound care, disease management, medication administration, pain management and patient education and help integrate therapy concepts, techniques,education, etc. 7. PT will assess and treat for/with: Lower extremity strength, range of motion, stamina, balance, functional mobility, safety, adaptive techniques and equipment, NMR, coordination, education. Goals are: mod I to supervision. 8. OT will assess and treat for/with: ADL's, functional mobility, safety, upper extremity strength, adaptive techniques and equipment, NMR, coordination, adaptive utensils, education. Goals are: mod I to supervision. 9. SLP will assess and treat for/with: n/a. Goals are: n/a. Will consider potential consultation if needed.. 10. Case Management and Social Worker will assess and treat for psychological issues and discharge planning. 11. Team conference will be held weekly to assess progress toward goals and to determine  barriers to discharge. 12. Patient will receive at least 3 hours of therapy per day at least 5 days per week. 13. ELOS: 10-15 days  14. Prognosis: good   Medical Problem List and Plan:  Gait disorder, parkinson's disease compounded by ABLA, recent hospital course  1. DVT Prophylaxis/Anticoagulation: Mechanical: Sequential compression devices, below knee Bilateral lower extremities  2. Pain Management: prn tylenol.  3. Mood: has good outlook. Will have LCSW follow for evaluation.  4. Neuropsych: This patient is capable of making decisions on her own behalf.  5. GIB: Will monitor H/H with routine check. Monitor for  recurrent melena. Continue protonix bid.  6. Parkinson's disease: Will continue sinemet qid and Mirapex.  -could some of her dyskinesias be related to her medications?  7. Fluid overload: Continue lasix daily.   -daily I's and O's as well as weights  [name], MD, [facility]   10/05/2013

## 2013-10-06 ENCOUNTER — Inpatient Hospital Stay (HOSPITAL_COMMUNITY): Payer: Medicare Other

## 2013-10-06 ENCOUNTER — Inpatient Hospital Stay (HOSPITAL_COMMUNITY): Payer: Medicare Other | Admitting: Physical Therapy

## 2013-10-06 DIAGNOSIS — G20 Parkinson's disease: Secondary | ICD-10-CM

## 2013-10-06 DIAGNOSIS — R5381 Other malaise: Secondary | ICD-10-CM

## 2013-10-06 DIAGNOSIS — K922 Gastrointestinal hemorrhage, unspecified: Secondary | ICD-10-CM

## 2013-10-06 LAB — COMPREHENSIVE METABOLIC PANEL
ALK PHOS: 62 U/L (ref 39–117)
ALT: <5 U/L (ref 0–35)
AST: 12 U/L (ref 0–37)
Albumin: 2.5 g/dL — ABNORMAL LOW (ref 3.5–5.2)
BILIRUBIN TOTAL: 0.5 mg/dL (ref 0.3–1.2)
BUN: 18 mg/dL (ref 6–23)
CHLORIDE: 100 meq/L (ref 96–112)
CO2: 29 meq/L (ref 19–32)
Calcium: 8.7 mg/dL (ref 8.4–10.5)
Creatinine, Ser: 0.85 mg/dL (ref 0.50–1.10)
GFR, EST AFRICAN AMERICAN: 77 mL/min — AB (ref >90)
GFR, EST NON AFRICAN AMERICAN: 66 mL/min — AB (ref >90)
GLUCOSE: 95 mg/dL (ref 70–99)
POTASSIUM: 4.6 meq/L (ref 3.7–5.3)
SODIUM: 136 meq/L — AB (ref 137–147)
Total Protein: 5.5 g/dL — ABNORMAL LOW (ref 6.0–8.3)

## 2013-10-06 LAB — CBC WITH DIFFERENTIAL/PLATELET
Basophils Absolute: 0.1 10*3/uL (ref 0.0–0.1)
Basophils Relative: 1 % (ref 0–1)
Eosinophils Absolute: 0.6 10*3/uL (ref 0.0–0.7)
Eosinophils Relative: 7 % — ABNORMAL HIGH (ref 0–5)
HCT: 27.2 % — ABNORMAL LOW (ref 36.0–46.0)
Hemoglobin: 8.7 g/dL — ABNORMAL LOW (ref 12.0–15.0)
LYMPHS ABS: 1.2 10*3/uL (ref 0.7–4.0)
Lymphocytes Relative: 14 % (ref 12–46)
MCH: 26.7 pg (ref 26.0–34.0)
MCHC: 32 g/dL (ref 30.0–36.0)
MCV: 83.4 fL (ref 78.0–100.0)
Monocytes Absolute: 0.7 10*3/uL (ref 0.1–1.0)
Monocytes Relative: 8 % (ref 3–12)
NEUTROS PCT: 70 % (ref 43–77)
Neutro Abs: 6.2 10*3/uL (ref 1.7–7.7)
PLATELETS: 185 10*3/uL (ref 150–400)
RBC: 3.26 MIL/uL — ABNORMAL LOW (ref 3.87–5.11)
RDW: 17.9 % — ABNORMAL HIGH (ref 11.5–15.5)
WBC: 8.9 10*3/uL (ref 4.0–10.5)

## 2013-10-06 NOTE — Progress Notes (Signed)
Inpatient [facility] Individual Statement of Services  Patient Name:  [name]  Date:  10/06/2013  Welcome to the [facility].  Our goal is to provide you with an individualized program based on your diagnosis and situation, designed to meet your specific needs.  With this comprehensive rehabilitation program, you will be expected to participate in at least 3 hours of rehabilitation therapies Monday-Friday, with modified therapy programming on the weekends.  Your rehabilitation program will include the following services:  Physical Therapy (PT), Occupational Therapy (OT), 24 hour per day rehabilitation nursing, Therapeutic Recreaction (TR), Case Management (Social Worker), Rehabilitation Medicine, Nutrition Services and Pharmacy Services  Weekly team conferences will be held on Tuesdays to discuss your progress.  Your Social Worker will talk with you frequently to get your input and to update you on team discussions.  Team conferences with you and your family in attendance may also be held.  Expected length of stay: 7-10 days  Overall anticipated outcome: modified independent  Depending on your progress and recovery, your program may change. Your Social Worker will coordinate services and will keep you informed of any changes. Your Social Worker's name and contact numbers are listed  below.  The following services may also be recommended but are not provided by the [facility]:    [place] will be made to provide these services after discharge if needed.  Arrangements include referral to agencies that provide these services.  Your insurance has been verified to be:  Medicare and [place] Your primary doctor is:  Dr. Hollace Kinnier  Pertinent information will be shared with your doctor and your insurance company.  Social Worker:  [place] or  ([phone]   Information discussed with and copy given to patient by: Lennart Pall, 10/06/2013, 4:34 PM

## 2013-10-06 NOTE — Evaluation (Signed)
Physical Therapy Assessment and Plan  Patient Details  Name: [name] MRN: [id number] Date of Birth: Apr 17, 1941  PT Diagnosis: Abnormality of gait, Coordination disorder, Difficulty walking and Muscle weakness Rehab Potential: Good ELOS: 7-10 days   Today's Date: 10/06/2013 Time: 1100-1158 Time Calculation (min): 58 min  Problem List:  Patient Active Problem List   Diagnosis Date Noted  . Physical deconditioning 10/05/2013  . Thrombocytopenia, unspecified 09/30/2013  . Rectal bleeding 09/29/2013  . UGI bleed 09/29/2013  . Anemia due to blood loss, acute 09/29/2013  . Anemia, iron deficiency 04/09/2013  . Cameron lesion, acute 01/08/2012  . Acute urinary retention, resolved after foley and no re-occurrence, 12/11/11  12/24/2011  . Cellulitis of lower leg 12/24/2011  . Diastolic dysfunction, grade 2 12/15/2011  . CAP (community acquired pneumonia)  12/15/2011  . Abnormal EKG,(TWI lead 3 on one of 7 EKGs this admission) 12/12/2011  . Chest pain, MI R/O, negative MI- secondary to CAP 12/10/11 12/10/2011  . Cor pulmonale 12/10/2011  . Syncope and collapse, pt fell and did not know why she fell. 12/10/2011  . Hematemesis 09/26/2011  . Acute blood loss anemia 09/26/2011  . Hypotension 09/26/2011  . Leucocytosis 09/26/2011  . Aspiration of blood 09/26/2011  . Parkinson's disease 09/26/2011  . HTN (hypertension) 09/26/2011  . GI bleed: January 2013, Hgb stable, stool negative 09/26/2011  . Gastric ulcer with hemorrhage 09/26/2011  . Esophagitis, erosive 09/26/2011    Past Medical History:  Past Medical History  Diagnosis Date  . Parkinson's disease   . Hypertension   . Arthritis   . Anemia   . Cellulitis of lower leg 12/24/2011  . Acute bronchitis   . Other abnormal blood chemistry   . Chronic systolic heart failure   . Acute on chronic systolic heart failure   . Cellulitis and abscess of leg, except foot   . Hypotension, unspecified   . Helicobacter pylori (H. pylori)    . Acute gastric ulcer with hemorrhage, without mention of obstruction    Past Surgical History:  Past Surgical History  Procedure Laterality Date  . Hip arthroscopy w/ labral repair    . Thyroidectomy    . Esophagogastroduodenoscopy  09/26/2011    Procedure: ESOPHAGOGASTRODUODENOSCOPY (EGD);  Surgeon: Zenovia Jarred, MD;  Location: [facility] ENDOSCOPY;  Service: Gastroenterology;  Laterality: N/A;  To be done at bedside.  . Esophagogastroduodenoscopy N/A 09/30/2013    Procedure: ESOPHAGOGASTRODUODENOSCOPY (EGD);  Surgeon: [name], MD;  Location: [facility];  Service: Endoscopy;  Laterality: N/A;    Assessment & Plan Clinical Impression: Patient is a 73 y.o. year old female with recent admission to the hospital with history of Parkinson's disease, HTN, CHF; who was admitted from PCP office on 09/29/13 with reports of BRBPR, dizziness and hgb 5.8. Patient with prior hx of GIB and stomach ulcers in 2011. She was transfused with 3 units PRBC and made NPO per Dr. Ardis Hughs input. EGD done on 09/30/13 revealing medium to large HH, ulcer treated with Epi and coagulation as well as pan gastritis. Melena resolved and patient tolerating regular diet. H/H stable. Has required IV lasix intermittent for fluid overload issues. Therapies initiated and CIR recommended due to deconditioned state.  Patient transferred to CIR on 10/05/2013 .   Patient currently requires min with mobility secondary to muscle weakness and decreased muscular endurance, decreased cardiorespiratoy endurance, deficits due to Parkinson's Disease and decreased standing balance and decreased balance strategies.  Prior to hospitalization, patient was modified independent  with mobility and  lived with Alone in a House home.  Home access is  Ramped entrance.  Patient will benefit from skilled PT intervention to maximize safe functional mobility and minimize fall risk for planned discharge home with intermittent assist.  Anticipate patient will benefit  from follow up [facility] at discharge.  PT - End of Session Endurance Deficit: Yes PT Assessment Rehab Potential: Good PT Patient demonstrates impairments in the following area(s): Balance;Endurance;Motor;Skin Integrity PT Transfers Functional Problem(s): Bed Mobility;Bed to Chair;Car;Furniture PT Locomotion Functional Problem(s): Ambulation;Stairs PT Plan PT Intensity: Minimum of 1-2 x/day ,45 to 90 minutes PT Frequency: 5 out of 7 days PT Duration Estimated Length of Stay: 7-10 days PT Treatment/Interventions: Ambulation/gait training;Balance/vestibular training;Community reintegration;Discharge planning;Disease management/prevention;DME/adaptive equipment instruction;Functional mobility training;Neuromuscular re-education;Pain management;Patient/family education;Psychosocial support;Skin care/wound management;Splinting/orthotics;Stair training;Therapeutic Activities;Therapeutic Exercise;UE/LE Strength taining/ROM;UE/LE Coordination activities;Wheelchair propulsion/positioning PT Transfers Anticipated Outcome(s): mod I overall; S car PT Locomotion Anticipated Outcome(s): mod I household gait; S ramp PT Recommendation Follow Up Recommendations: Home health PT Patient destination: Home Equipment Recommended: None recommended by PT Equipment Details: Pt already has rollator  Skilled Therapeutic Intervention Individual treatment initiated with focus on gait training with RW for endurance, stair negotiation for community mobility, bed mobility in ADL apartment to simulate home environment, toileting tasks with focus on transfers and balance, and Nustep for endurance and strengthening x 5 min on level 4.  PT Evaluation Precautions/Restrictions Precautions Precautions: Fall Precaution Comments: reports fall  x 2 in November 14 (h/o Parkinson's Disease) Restrictions Weight Bearing Restrictions: No Pain Pain Assessment Pain Assessment: No/denies pain Home Living/Prior Functioning Home  Living Available Help at Discharge: Family;Available PRN/intermittently (brothers provide A ) Type of Home: House Home Access: Ramped entrance Home Layout: One level Additional Comments: pt lives alone, brothers get her groceries for her, she has Meals on Wheels for lunch and one of her brothers brings her supper.  Lives With: Alone Prior Function Level of Independence: Independent with basic ADLs;Requires assistive device for independence;Independent with transfers  Able to Take Stairs?: No Driving: No Vocation: Retired Leisure: Hobbies-yes (Comment) Comments: goes to church 1 x week using [employer] Vision/Perception  Vision - History Baseline Vision: Wears glasses only for reading Patient Visual Report: No change from baseline Vision - Assessment Eye Alignment: Within Functional Limits Perception Perception: Within Functional Limits Praxis Praxis: Intact  Cognition Overall Cognitive Status: Within Functional Limits for tasks assessed Arousal/Alertness: Awake/alert Orientation Level: Oriented X4 Attention: Sustained Sustained Attention: Appears intact Memory: Appears intact Awareness: Appears intact Problem Solving: Appears intact Executive Function: Self Correcting Safety/Judgment: Appears intact Sensation Sensation Light Touch: Appears Intact Stereognosis: Appears Intact Hot/Cold: Appears Intact Proprioception: Appears Intact Coordination Gross Motor Movements are Fluid and Coordinated: Yes Fine Motor Movements are Fluid and Coordinated: No (mild tremors, bil hands) Motor  Motor Motor: Motor perseverations Motor - Skilled Clinical Observations: d/t Parkinson's tremors  Mobility Bed Mobility Bed Mobility: Rolling Left;Left Sidelying to Sit Rolling Left: 4: Min assist Rolling Left Details: Verbal cues for safe use of DME/AE;Manual facilitation for weight shifting Left Sidelying to Sit: 4: Min assist Left Sidelying to Sit Details: Manual facilitation for weight  shifting;Verbal cues for safe use of DME/AE Supine to Sit: 4: Min assist Supine to Sit Details: Verbal cues for precautions/safety Transfers Sit to Stand: 4: Min assist Stand to Sit: 4: Min Solicitor Ambulation/Gait Assistance: 4: Min assist;4: Min guard  Trunk/Postural Assessment  Cervical Assessment Cervical Assessment: Exceptions to [facility] (foward head) Thoracic Assessment Thoracic Assessment: Exceptions to [facility] (flexed posture) Lumbar Assessment Lumbar  Assessment: Exceptions to [facility] (posterior pelvic tilt) Postural Control Postural Control: Within Functional Limits  Balance Balance Balance Assessed: Yes Static Sitting Balance Static Sitting - Level of Assistance: 6: Modified independent (Device/Increase time) Dynamic Sitting Balance Dynamic Sitting - Level of Assistance: 5: Stand by assistance Static Standing Balance Static Standing - Level of Assistance: 5: Stand by assistance (with RW) Dynamic Standing Balance Dynamic Standing - Level of Assistance: 4: Min assist (with RW) Extremity Assessment  RUE Assessment RUE Assessment: Within Functional Limits LUE Assessment LUE Assessment: Within Functional Limits RLE Assessment RLE Assessment:  (grossly 4/5; decreased muscular endurance) LLE Assessment LLE Assessment:  (grossly 4/5; decreased muscular endurance)  FIM:  FIM - Bed/Chair Transfer Bed/Chair Transfer Assistive Devices: Copy: 5: Supine > Sit: Supervision (verbal cues/safety issues);5: Sit > Supine: Supervision (verbal cues/safety issues);4: Bed > Chair or W/C: Min A (steadying Pt. > 75%);4: Chair or W/C > Bed: Min A (steadying Pt. > 75%) FIM - Locomotion: Wheelchair Locomotion: Wheelchair: 1: Total Assistance/staff pushes wheelchair (Pt<25%) FIM - Locomotion: Ambulation Locomotion: Ambulation Assistive Devices: Administrator Ambulation/Gait Assistance: 4: Min assist;4: Min guard Locomotion: Ambulation: 2: Travels 50 - 149  ft with minimal assistance (Pt.>75%) FIM - Locomotion: Stairs Locomotion: Scientist, physiological: Hand rail - 2 Locomotion: Stairs: 2: Up and Down 4 - 11 stairs with minimal assistance (Pt.>75%)   Refer to Care Plan for Long Term Goals  Recommendations for other services: None  Discharge Criteria: Patient will be discharged from PT if patient refuses treatment 3 consecutive times without medical reason, if treatment goals not met, if there is a change in medical status, if patient makes no progress towards goals or if patient is discharged from hospital.  The above assessment, treatment plan, treatment alternatives and goals were discussed and mutually agreed upon: by patient  Allayne Gitelman 10/06/2013, 12:08 PM

## 2013-10-06 NOTE — Progress Notes (Signed)
Subjective/Complaints: Uneventful night. Slept pretty well (was tired by the end of yesterday) A 12 point review of systems has been performed and if not noted above is otherwise negative.   Objective: Vital Signs: Blood pressure 125/66, pulse 71, temperature 97.9 F (36.6 C), temperature source Oral, resp. rate 18, height 5\' 4"  (1.626 m), weight 81 kg (178 lb 9.2 oz), SpO2 94.00%. No results found.  Recent Labs  10/04/13 1010 10/06/13 0606  WBC  --  8.9  HGB 8.4* 8.7*  HCT 26.4* 27.2*  PLT  --  185    Recent Labs  10/06/13 0606  NA 136*  K 4.6  CL 100  GLUCOSE 95  BUN 18  CREATININE 0.85  CALCIUM 8.7   CBG (last 3)  No results found for this basename: GLUCAP,  in the last 72 hours  Wt Readings from Last 3 Encounters:  10/06/13 81 kg (178 lb 9.2 oz)  10/05/13 82.056 kg (180 lb 14.4 oz)  10/05/13 82.056 kg (180 lb 14.4 oz)    Physical Exam:  Constitutional: She is oriented to person, place, and time.   HENT: oral mucosa generally pink and moist, dentition poor Head: Normocephalic and atraumatic.  Eyes: Conjunctivae are normal. Pupils are equal, round, and reactive to light.  Neck: Normal range of motion. Neck supple.  Cardiovascular: Regular rhythm. No murmurs rubs or galllops Respiratory: Effort normal and breath sounds normal. No respiratory distress. She has no wheezes or rales.  GI: Soft. Bowel sounds are normal. She exhibits no distension. There is no tenderness.  Musculoskeletal: She exhibits no edema and no tenderness.  Diffuse ecchymotic area along left forearm.  Neurological: She is more alert and oriented to person, place, and time.  Ataxic speech--  Persistent, intermittent BUE and LUE tremors, head bobbing movements, low frequency. No rigidity. Fair insight and awareness. Memory good. Strength remains 4/5 UE deltoid, bicep, tricep, HI. LE 3+ HF,4- to 4 KE, 4/5 ankle dorsi and plantar flexion. No gross sensory deficits  Skin: Skin is warm and dry.   Psychiatric: She has a normal mood and affect. Her behavior is normal. Thought content normal   Assessment/Plan: 1. Functional deficits secondary to pseudoexacerbation of parkinson's disease/ gait disorder which require 3+ hours per day of interdisciplinary therapy in a comprehensive inpatient rehab setting. Physiatrist is providing close team supervision and 24 hour management of active medical problems listed below. Physiatrist and rehab team continue to assess barriers to discharge/monitor patient progress toward functional and medical goals. FIM:                   Comprehension Comprehension Mode: Auditory Comprehension: 6-Follows complex conversation/direction: With extra time/assistive device  Expression Expression Mode: Verbal Expression: 5-Expresses complex 90% of the time/cues < 10% of the time  Social Interaction Social Interaction: 6-Interacts appropriately with others with medication or extra time (anti-anxiety, antidepressant).  Problem Solving Problem Solving: 5-Solves basic problems: With no assist  Memory Memory: 6-More than reasonable amt of time  Medical Problem List and Plan:  Gait disorder, parkinson's disease compounded by ABLA, recent hospital course  1. DVT Prophylaxis/Anticoagulation: Mechanical: Sequential compression devices, below knee Bilateral lower extremities  2. Pain Management: prn tylenol adequate 3. Mood: has good outlook. Will have LCSW follow for evaluation.  4. Neuropsych: This patient is capable of making decisions on her own behalf.  5. GIB: hgb increased today.  Monitor for recurrent melena. Continue protonix bid.  6. Parkinson's disease: Will continue sinemet qid and Mirapex.  -could  some of her dyskinesias be related to her medications?  7. Fluid overload: Continue lasix daily.  -daily I's and O's as well as weights -appears balanced at present   LOS (Days) 1 A FACE TO FACE EVALUATION WAS PERFORMED  Tabytha Gradillas  T 10/06/2013 7:46 AM

## 2013-10-06 NOTE — Progress Notes (Signed)
Social Work  Social Work Assessment and Plan  Patient Details  Name: [name] MRN: [id number] Date of Birth: Sep 23, 1941  Today's Date: 10/06/2013  Problem List:  Patient Active Problem List   Diagnosis Date Noted  . Physical deconditioning 10/05/2013  . Thrombocytopenia, unspecified 09/30/2013  . Rectal bleeding 09/29/2013  . UGI bleed 09/29/2013  . Anemia due to blood loss, acute 09/29/2013  . Anemia, iron deficiency 04/09/2013  . Cameron lesion, acute 01/08/2012  . Acute urinary retention, resolved after foley and no re-occurrence, 12/11/11  12/24/2011  . Cellulitis of lower leg 12/24/2011  . Diastolic dysfunction, grade 2 12/15/2011  . CAP (community acquired pneumonia)  12/15/2011  . Abnormal EKG,(TWI lead 3 on one of 7 EKGs this admission) 12/12/2011  . Chest pain, MI R/O, negative MI- secondary to CAP 12/10/11 12/10/2011  . Cor pulmonale 12/10/2011  . Syncope and collapse, pt fell and did not know why she fell. 12/10/2011  . Hematemesis 09/26/2011  . Acute blood loss anemia 09/26/2011  . Hypotension 09/26/2011  . Leucocytosis 09/26/2011  . Aspiration of blood 09/26/2011  . Parkinson's disease 09/26/2011  . HTN (hypertension) 09/26/2011  . GI bleed: January 2013, Hgb stable, stool negative 09/26/2011  . Gastric ulcer with hemorrhage 09/26/2011  . Esophagitis, erosive 09/26/2011   Past Medical History:  Past Medical History  Diagnosis Date  . Parkinson's disease   . Hypertension   . Arthritis   . Anemia   . Cellulitis of lower leg 12/24/2011  . Acute bronchitis   . Other abnormal blood chemistry   . Chronic systolic heart failure   . Acute on chronic systolic heart failure   . Cellulitis and abscess of leg, except foot   . Hypotension, unspecified   . Helicobacter pylori (H. pylori)   . Acute gastric ulcer with hemorrhage, without mention of obstruction    Past Surgical History:  Past Surgical History  Procedure Laterality Date  . Hip arthroscopy w/ labral  repair    . Thyroidectomy    . Esophagogastroduodenoscopy  09/26/2011    Procedure: ESOPHAGOGASTRODUODENOSCOPY (EGD);  Surgeon: Zenovia Jarred, MD;  Location: [facility] ENDOSCOPY;  Service: Gastroenterology;  Laterality: N/A;  To be done at bedside.  . Esophagogastroduodenoscopy N/A 09/30/2013    Procedure: ESOPHAGOGASTRODUODENOSCOPY (EGD);  Surgeon: [name], MD;  Location: [place];  Service: Endoscopy;  Laterality: N/A;   Social History:  reports that she has never smoked. She has never used smokeless tobacco. She reports that she does not drink alcohol or use illicit drugs.  Family / Support Systems Marital Status: Single Patient Roles: Other (Comment) (sister) Other Supports: brother, [name] "Lylith Bebeau @ (C) [phone];  brother, Citlalic Norlander @ [phone] or ([phone];  brother, Glendell Docker --all of these siblings live locally as well as 3 other siblings living in [place] Anticipated Caregiver: Brothers prn with meals, groceries, etc. No 24/7;  also notes that her neice has recently begin assisting her with her housekeeping Ability/Limitations of Caregiver: brothers work but daily check in with her and provide assistance Caregiver Availability: Intermittent Family Dynamics: pt describes her family as supportive and notes that her brother (or sis-in-laws) check in with her almost daily - denies any concern about support she will have at home.  Social History Preferred language: English Religion: Non-Denominational Cultural Background: NA Education: HS Read: Yes Write: Yes Employment Status: Retired Freight forwarder Issues: none Guardian/Conservator: none - per MD, pt capable of making decision on her own behalf   Abuse/Neglect  Physical Abuse: Denies Verbal Abuse: Denies Sexual Abuse: Denies Exploitation of patient/patient's resources: Denies Self-Neglect: Denies  Emotional Status Pt's affect, behavior adn adjustment status: pt lying in bed and reports feeling "weak" but  happy to have had a bath this morning.  Pt speaks easily with this SW about her living arrangements and her supports at home.  She does admit that she has had falls in the past, however, feels she was managing well with family assistance.  Denies any s/s of emotional distress and no h/o mental health issues.  Formal depression screen does not appear indicated at this time, however, will monitor. Recent Psychosocial Issues: None noted by pt Pyschiatric History: None Substance Abuse History: None  Patient / Family Perceptions, Expectations & Goals Pt/Family understanding of illness & functional limitations: pt with good, basic understanding of her medical issues and current functional limitiations/ need for CIR Premorbid pt/family roles/activities: pt living alone and receiving intermittent assistance from her family;  managing her finances on her own;  neice now assisting with housekeeping Anticipated changes in roles/activities/participation: little change anticipated if pt can reach mod i goals overall - if more care needed then family will need to assume or change in d/c plan Pt/family expectations/goals: "I just want to get myself stronger"  [employer]: Other (Comment) (Mobile Meals M-F) Premorbid Home Care/DME Agencies: None Transportation available at discharge: yes Resource referrals recommended: Advocacy groups  Discharge Planning Living Arrangements: Alone Support Systems: Other relatives;Church/faith community Type of Residence: Private residence Insurance Resources: Education officer, museum (specify) Nurse, mental health) Financial Resources: Radio broadcast assistant Screen Referred: No Living Expenses: Own Money Management: Patient Does the patient have any problems obtaining your medications?: No Home Management: pt and neice Patient/Family Preliminary Plans: pt plans to return to her own home if she is able and intermittent assistance of family;  of noted, she  is already on waitlist with DSS for [facility] to assist with b/d Barriers to Discharge: Self care;Family Support (family cannot provide 24/7) Social Work Anticipated Follow Up Needs: HH/OP Expected length of stay: ELOS 10 to 14 days  Clinical Impression Pleasant, oriented and soft spoken woman here following GIB with ABLA and very deconditioned.  Good family support, however, not 24/7.  Pt hopeful she can return to prior level of function and be able to return home with only needing intermittent assist of family.  Kyisha Fowle 10/06/2013, 4:33 PM

## 2013-10-06 NOTE — Evaluation (Signed)
Occupational Therapy Assessment and Plan  Patient Details  Name: [name] MRN: [id number] Date of Birth: [birth year]  OT Diagnosis: muscle weakness (generalized) Rehab Potential: Rehab Potential: Good ELOS: 7-10 days   Today's Date: 10/06/2013 Time: 4098-1191 Time Calculation (min): 70 min  Problem List:  Patient Active Problem List   Diagnosis Date Noted  . Physical deconditioning 10/05/2013  . Thrombocytopenia, unspecified 09/30/2013  . Rectal bleeding 09/29/2013  . UGI bleed 09/29/2013  . Anemia due to blood loss, acute 09/29/2013  . Anemia, iron deficiency 04/09/2013  . Cameron lesion, acute 01/08/2012  . Acute urinary retention, resolved after foley and no re-occurrence, 12/11/11  12/24/2011  . Cellulitis of lower leg 12/24/2011  . Diastolic dysfunction, grade 2 12/15/2011  . CAP (community acquired pneumonia)  12/15/2011  . Abnormal EKG,(TWI lead 3 on one of 7 EKGs this admission) 12/12/2011  . Chest pain, MI R/O, negative MI- secondary to CAP 12/10/11 12/10/2011  . Cor pulmonale 12/10/2011  . Syncope and collapse, pt fell and did not know why she fell. 12/10/2011  . Hematemesis 09/26/2011  . Acute blood loss anemia 09/26/2011  . Hypotension 09/26/2011  . Leucocytosis 09/26/2011  . Aspiration of blood 09/26/2011  . Parkinson's disease 09/26/2011  . HTN (hypertension) 09/26/2011  . GI bleed: January 2013, Hgb stable, stool negative 09/26/2011  . Gastric ulcer with hemorrhage 09/26/2011  . Esophagitis, erosive 09/26/2011    Past Medical History:  Past Medical History  Diagnosis Date  . Parkinson's disease   . Hypertension   . Arthritis   . Anemia   . Cellulitis of lower leg 12/24/2011  . Acute bronchitis   . Other abnormal blood chemistry   . Chronic systolic heart failure   . Acute on chronic systolic heart failure   . Cellulitis and abscess of leg, except foot   . Hypotension, unspecified   . Helicobacter pylori (H. pylori)   . Acute gastric ulcer with  hemorrhage, without mention of obstruction    Past Surgical History:  Past Surgical History  Procedure Laterality Date  . Hip arthroscopy w/ labral repair    . Thyroidectomy    . Esophagogastroduodenoscopy  09/26/2011    Procedure: ESOPHAGOGASTRODUODENOSCOPY (EGD);  Surgeon: Zenovia Jarred, MD;  Location: [facility] ENDOSCOPY;  Service: Gastroenterology;  Laterality: N/A;  To be done at bedside.  . Esophagogastroduodenoscopy N/A 09/30/2013    Procedure: ESOPHAGOGASTRODUODENOSCOPY (EGD);  Surgeon: [name], MD;  Location: [place];  Service: Endoscopy;  Laterality: N/A;    Assessment & Plan Clinical Impression: Patient is a 73 y.o. year old female with history of Parkinson's disease, HTN, CHF; who was admitted from PCP office on 09/29/13 with reports of bright red bleed per rectum (BRBPR), dizziness and hgb 5.8. Patient with prior hx of GIB and stomach ulcers in 2011. She was transfused with 3 units PRBC and made NPO per Dr. Ardis Hughs input. EGD done on 09/30/13 revealing medium to large HH, ulcer treated with Epi and coagulation as well as pan gastritis. Melena resolved and patient tolerating regular diet. H/H stable. Has required IV lasix intermittent for fluid overload issues. Therapies initiated and CIR recommended due to deconditioned state. Patient admitted today per rehab team recommendations.   Patient transferred to CIR on 10/05/2013 .    Patient currently requires mod with IADL secondary to muscle joint tightness.  Prior to hospitalization, patient could complete BADL/iADL with modified independence.  Patient will benefit from skilled intervention to increase independence with basic self-care skills prior to discharge  home independently.  Anticipate patient will require intermittent supervision and follow up home health.  OT - End of Session Activity Tolerance: Tolerates 30+ min activity with multiple rests Endurance Deficit: Yes OT Assessment Rehab Potential: Good Barriers to Discharge:  Decreased caregiver support OT Patient demonstrates impairments in the following area(s): Balance;Endurance;Motor OT Basic ADL's Functional Problem(s): Bathing;Dressing;Toileting OT Advanced ADL's Functional Problem(s): Laundry;Simple Meal Preparation OT Transfers Functional Problem(s): Toilet;Tub/Shower OT Plan OT Intensity: Minimum of 1-2 x/day, 45 to 90 minutes OT Frequency: 5 out of 7 days OT Duration/Estimated Length of Stay: 7-10 days OT Treatment/Interventions: Functional mobility training;Self Care/advanced ADL retraining;Therapeutic Activities;Therapeutic Exercise;UE/LE Strength taining/ROM;Discharge planning;Balance/vestibular training;DME/adaptive equipment instruction OT Self Feeding Anticipated Outcome(s): Mod I OT Basic Self-Care Anticipated Outcome(s): Mod I OT Toileting Anticipated Outcome(s): Mod I OT Bathroom Transfers Anticipated Outcome(s): Mod I OT Recommendation Patient destination: Home Follow Up Recommendations: Home health OT Equipment Recommended: None recommended by OT Equipment Details: has BSC and tub bench   Skilled Therapeutic Intervention 1:1 OT initial eval completed with treatment focus on transfers, effective use of DME, endurance, and dynamic standing balance.   Patient was able to ambulate from bed to bathroom, transfer to toilet, complete toileting (BM), and transfer to tub bench with min A after instruction on safety and technique and several rest breaks.   Patient presents with primary deficits of impaired endurance, impaired dynamic standing balance, and LE weakness during performance of ADL.  OT Evaluation Precautions/Restrictions  Precautions Precautions: None Precaution Comments: reports fall  x 2 in November 14 Restrictions Weight Bearing Restrictions: No  General Chart Reviewed: Yes Family/Caregiver Present: No  Vital Signs Therapy Vitals Temp: 97.9 F (36.6 C) Temp src: Oral Pulse Rate: 71 Resp: 18 BP: 125/66 mmHg Patient  Position, if appropriate: Lying Oxygen Therapy SpO2: 94 % O2 Device: None (Room air)  Pain Pain Assessment Pain Assessment: No/denies pain  Home Living/Prior Functioning Home Living Available Help at Discharge: Family;Available PRN/intermittently Type of Home: House Home Access: Ramped entrance Home Layout: One level Additional Comments: pt lives alone, brothers get her groceries for her, she has Meals on Wheels for lunch and one of her brothers brings her supper.  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 Leisure and Hobbies: sewing, reading, Music therapist, church pianist Prior Function Level of Independence: Independent with basic ADLs;Requires assistive device for independence;Independent with transfers  Able to Take Stairs?: No Driving: No Vocation: Retired Leisure: Hobbies-yes (Comment) Comments: sponge bathes much of the time; has asked Health Dept for aide to help with adls; on waiting list, has taken showers using tub bench when tremors are mild  ADL ADL ADL Comments: see FIM  Vision/Perception  Vision - History Baseline Vision: Wears glasses only for reading Patient Visual Report: No change from baseline Vision - Assessment Eye Alignment: Within Functional Limits Perception Perception: Within Functional Limits Praxis Praxis: Intact   Cognition Overall Cognitive Status: Within Functional Limits for tasks assessed Arousal/Alertness: Awake/alert Orientation Level: Oriented X4 Attention: Sustained Sustained  Attention: Appears intact Memory: Appears intact Awareness: Appears intact Problem Solving: Appears intact Executive Function: Self Correcting Safety/Judgment: Appears intact  Sensation Sensation Light Touch: Appears Intact Stereognosis: Appears Intact Hot/Cold: Appears Intact Proprioception: Appears  Intact Coordination Gross Motor Movements are Fluid and Coordinated: Yes Fine Motor Movements are Fluid and Coordinated: No (mild tremors, bil hands)  Motor  Motor Motor: Motor perseverations Motor - Skilled Clinical Observations: d/t Parkinson's tremors  Mobility  Bed Mobility Bed Mobility: Rolling Left;Left Sidelying to Sit Rolling Left: 4: Min assist Rolling Left Details: Verbal cues for safe use of DME/AE;Manual facilitation for weight shifting Left Sidelying to Sit: 4: Min assist Left Sidelying to Sit Details: Manual facilitation for weight shifting;Verbal cues for safe use of DME/AE Supine to Sit: 4: Min assist Supine to Sit Details: Verbal cues for precautions/safety Transfers Transfers: Sit to Stand;Stand to Sit Sit to Stand: 4: Min assist Stand to Sit: 4: Min assist   Trunk/Postural Assessment  Cervical Assessment Cervical Assessment: Exceptions to [facility] (foward head) Thoracic Assessment Thoracic Assessment: Exceptions to [facility] (flexed posture) Lumbar Assessment Lumbar Assessment: Exceptions to [facility] (posterior pelvic tilt) Postural Control Postural Control: Within Functional Limits   Balance Balance Balance Assessed: Yes Static Sitting Balance Static Sitting - Level of Assistance: 6: Modified independent (Device/Increase time) Dynamic Sitting Balance Dynamic Sitting - Level of Assistance: 5: Stand by assistance Static Standing Balance Static Standing - Level of Assistance: 5: Stand by assistance  Extremity/Trunk Assessment RUE Assessment RUE Assessment: Within Functional Limits LUE Assessment LUE Assessment: Within Functional Limits  FIM:  FIM -  Grooming Grooming Steps: Wash, rinse, dry face;Wash, rinse, dry hands;Brush, comb hair Grooming: 4: Patient completes 3 of 4 or 4 of 5 steps FIM - Bathing Bathing Steps Patient Completed: Chest;Right Arm;Left Arm;Abdomen;Front perineal area;Buttocks;Right upper leg;Left upper leg Bathing: 4: Min-Patient completes 8-9 [id number] parts or 75+ percent FIM - Upper Body Dressing/Undressing Upper body dressing/undressing steps patient completed: Thread/unthread right bra strap;Thread/unthread left bra strap;Hook/unhook bra;Thread/unthread right sleeve of pullover shirt/dresss;Thread/unthread left sleeve of pullover shirt/dress;Pull shirt over trunk Upper body dressing/undressing: 4: Min-Patient completed 75 plus % of tasks FIM - Lower Body Dressing/Undressing Lower body dressing/undressing steps patient completed: Thread/unthread right underwear leg;Thread/unthread left underwear leg;Pull underwear up/down;Thread/unthread right pants leg;Thread/unthread left pants leg;Pull pants up/down Lower body dressing/undressing: 3: Mod-Patient completed 50-74% of tasks FIM - Toileting Toileting steps completed by patient: Adjust clothing prior to toileting;Performs perineal hygiene;Adjust clothing after toileting Toileting: 5: Supervision: Safety issues/verbal cues FIM - Bed/Chair Transfer Bed/Chair Transfer Assistive Devices: Bed rails;Arm rests Bed/Chair Transfer: 4: Supine > Sit: Min A (steadying Pt. > 75%/lift 1 leg);4: Bed > Chair or W/C: Min A (steadying Pt. > 75%);4: Chair or W/C > Bed: Min A (steadying Pt. > 75%);4: Sit > Supine: Min A (steadying pt. > 75%/lift 1 leg) FIM - TRadio producerDevices: Bedside commode Toilet Transfers: 4-To toilet/BSC: Min A (steadying Pt. > 75%);4-From toilet/BSC: Min A (steadying Pt. > 75%) FIM - Tub/Shower Transfers Tub/Shower Assistive Devices: Tub transfer bench;Grab bars;Walk in shower Tub/shower Transfers: 4-Into Tub/Shower: Min A (steadying  Pt. > 75%/lift 1 leg);4-Out of Tub/Shower: Min A (steadying Pt. > 75%/lift 1 leg)   Refer to Care Plan for Long Term Goals  Recommendations for other services: None  Discharge Criteria: Patient will be discharged from OT if patient refuses treatment 3 consecutive times without medical reason, if treatment goals not met, if there is a change in medical status, if patient makes no progress towards goals or if patient is discharged from hospital.  The above assessment, treatment plan, treatment alternatives and goals were discussed and mutually agreed upon: by patient  Second session: Time: 1415-1445 Time Calculation (min): 30 min  Pain Assessment: No pain  Skilled Therapeutic Interventions: ADL-retraining with focus on bed mobility, functional mobility using 4-wheeled walker w/seat, endurance, toilet transfer and toileting.   Patient received supine in bed, room changed to M09, and required mod assist to rise from supine to sitting at edge of bed.   Pt ambulated using 4WW with min guard assist from room to nurses station, rested 5 minutes in office chair and returned to room to toilet.   Patient required min assist to transfer to toilet and was unable to manage her clothing or toilet hygiene this session w/o assistance (max assist provided) due to fatigue and fear of falling d/t impaired dynamic standing balance.   Pt ambulated from toilet to bed and required mod assist to recover from sitting to supine, max assist to [facility] d/t fatigue.   See FIM for current functional status  Therapy/Group: Individual Therapy  Bria Portales 10/06/2013, 11:57 AM

## 2013-10-06 NOTE — Progress Notes (Signed)
Physical Therapy Note  Patient Details  Name: JERRY HAUGEN MRN: [id number] Date of Birth: Jul 11, 1941 Today's Date: 10/06/2013  1300 -1335 (35 minutes) individual (missed 10 minutes /fatigue) Pain: no reported pain Focus of treatment: therapeutic exercise focused on bilateral LE strengthening/ activity tolerance Treatment: Pt in bed upon arrival; Therapeutic exercise X 20 in supine (bilateral LEs)- ankle pumps, heel slides, hip abduction ; sit ><  stand 2 X 3 to RW (min assist) ; pt reports increased fatigue and requests to lay back down ; bed alarm activated .    Jaedon Siler,JIM 10/06/2013, 1:35 PM

## 2013-10-07 ENCOUNTER — Inpatient Hospital Stay (HOSPITAL_COMMUNITY): Payer: Medicare Other | Admitting: Physical Therapy

## 2013-10-07 ENCOUNTER — Inpatient Hospital Stay (HOSPITAL_COMMUNITY): Payer: Medicare Other

## 2013-10-07 MED ORDER — PRAMIPEXOLE DIHYDROCHLORIDE 1 MG PO TABS
1.0000 mg | ORAL_TABLET | Freq: Three times a day (TID) | ORAL | Status: DC
  Administered 2013-10-07 – 2013-10-09 (×6): 1 mg via ORAL
  Filled 2013-10-07 (×9): qty 1

## 2013-10-07 NOTE — Plan of Care (Signed)
Problem: RH PAIN MANAGEMENT Goal: RH STG PAIN MANAGED AT OR BELOW PT'S PAIN GOAL <3 No report of pain

## 2013-10-07 NOTE — Plan of Care (Signed)
Problem: RH BOWEL ELIMINATION Goal: RH STG MANAGE BOWEL WITH ASSISTANCE STG Manage Bowel with Assistance. Mod I Outcome: Progressing No incontinent episode reported     

## 2013-10-07 NOTE — Progress Notes (Signed)
Subjective/Complaints: No new problems. Had a good night. Just went to bathroom A 12 point review of systems has been performed and if not noted above is otherwise negative.   Objective: Vital Signs: Blood pressure 116/72, pulse 69, temperature 98.3 F (36.8 C), temperature source Oral, resp. rate 17, height 5\' 4"  (1.626 m), weight 79.6 kg (175 lb 7.8 oz), SpO2 97.00%. No results found.  Recent Labs  10/04/13 1010 10/06/13 0606  WBC  --  8.9  HGB 8.4* 8.7*  HCT 26.4* 27.2*  PLT  --  185    Recent Labs  10/06/13 0606  NA 136*  K 4.6  CL 100  GLUCOSE 95  BUN 18  CREATININE 0.85  CALCIUM 8.7   CBG (last 3)  No results found for this basename: GLUCAP,  in the last 72 hours  Wt Readings from Last 3 Encounters:  10/07/13 79.6 kg (175 lb 7.8 oz)  10/05/13 82.056 kg (180 lb 14.4 oz)  10/05/13 82.056 kg (180 lb 14.4 oz)    Physical Exam:  Constitutional: She is oriented to person, place, and time.   HENT: oral mucosa generally pink and moist, dentition poor Head: Normocephalic and atraumatic.  Eyes: Conjunctivae are normal. Pupils are equal, round, and reactive to light.  Neck: Normal range of motion. Neck supple.  Cardiovascular: Regular rhythm. No murmurs rubs or galllops Respiratory: Effort normal and breath sounds normal. No respiratory distress. She has no wheezes or rales.  GI: Soft. Bowel sounds are normal. She exhibits no distension. There is no tenderness.  Musculoskeletal: She exhibits no edema and no tenderness.  Diffuse ecchymotic area along left forearm.  Neurological: She is more alert and oriented to person, place, and time.  Ataxic speech--  Persistent, intermittent BUE and LUE tremors, head bobbing , rhythmic movements, low frequency. No rigidity. Fair insight and awareness. Memory good. Strength remains 4/5 UE deltoid, bicep, tricep, HI. LE 3+ HF,4- to 4 KE, 4/5 ankle dorsi and plantar flexion. No gross sensory deficits  Skin: Skin is warm and dry.   Psychiatric: She has a normal mood and affect. Her behavior is normal. Thought content normal   Assessment/Plan: 1. Functional deficits secondary to pseudoexacerbation of parkinson's disease/ gait disorder which require 3+ hours per day of interdisciplinary therapy in a comprehensive inpatient rehab setting. Physiatrist is providing close team supervision and 24 hour management of active medical problems listed below. Physiatrist and rehab team continue to assess barriers to discharge/monitor patient progress toward functional and medical goals. FIM: FIM - Bathing Bathing Steps Patient Completed: Chest;Right Arm;Left Arm;Abdomen;Front perineal area;Buttocks;Right upper leg;Left upper leg Bathing: 4: Min-Patient completes 8-9 20f 10 parts or 75+ percent  FIM - Upper Body Dressing/Undressing Upper body dressing/undressing steps patient completed: Thread/unthread right bra strap;Thread/unthread left bra strap;Hook/unhook bra;Thread/unthread right sleeve of pullover shirt/dresss;Thread/unthread left sleeve of pullover shirt/dress;Pull shirt over trunk Upper body dressing/undressing: 4: Min-Patient completed 75 plus % of tasks FIM - Lower Body Dressing/Undressing Lower body dressing/undressing steps patient completed: Thread/unthread right underwear leg;Thread/unthread left underwear leg;Pull underwear up/down;Thread/unthread right pants leg;Thread/unthread left pants leg;Pull pants up/down Lower body dressing/undressing: 3: Mod-Patient completed 50-74% of tasks  FIM - Toileting Toileting steps completed by patient: Adjust clothing prior to toileting;Performs perineal hygiene;Adjust clothing after toileting Toileting: 5: Supervision: Safety issues/verbal cues  FIM - Radio producer Devices: Bedside commode Toilet Transfers: 4-From toilet/BSC: Min A (steadying Pt. > 75%);4-To toilet/BSC: Min A (steadying Pt. > 75%)  FIM - Buyer, retail  Devices: Copy: 5: Supine > Sit: Supervision (verbal cues/safety issues);5: Sit > Supine: Supervision (verbal cues/safety issues);4: Bed > Chair or W/C: Min A (steadying Pt. > 75%);4: Chair or W/C > Bed: Min A (steadying Pt. > 75%)  FIM - Locomotion: Wheelchair Locomotion: Wheelchair: 1: Total Assistance/staff pushes wheelchair (Pt<25%) FIM - Locomotion: Ambulation Locomotion: Ambulation Assistive Devices: Administrator Ambulation/Gait Assistance: 4: Min assist;4: Min guard Locomotion: Ambulation: 2: Travels 50 - 149 ft with minimal assistance (Pt.>75%)  Comprehension Comprehension Mode: Auditory Comprehension: 6-Follows complex conversation/direction: With extra time/assistive device  Expression Expression Mode: Verbal Expression: 5-Expresses basic 90% of the time/requires cueing < 10% of the time.  Social Interaction Social Interaction: 6-Interacts appropriately with others with medication or extra time (anti-anxiety, antidepressant).  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 DVT Prophylaxis/Anticoagulation: Mechanical: Sequential compression devices, below knee Bilateral lower extremities  2. Pain Management: prn tylenol adequate 3. Mood: has good outlook. Will have LCSW follow for evaluation.  4. Neuropsych: This patient is capable of making decisions on her own behalf.  5. GIB: hgb increased today.  Monitor for recurrent melena. Continue protonix bid.  6. Parkinson's disease: Will continue sinemet qid and Mirapex.  -will decrease mirapex slightly to see if it helps with dyskinesias (discussed with patient) 7. Fluid overload: Continue lasix daily.  -daily I's and O's as well as weights -appears balanced at  present   LOS (Days) 2 A FACE TO FACE EVALUATION WAS PERFORMED  SWARTZ,ZACHARY T 10/07/2013 8:29 AM

## 2013-10-07 NOTE — Progress Notes (Signed)
Physical Therapy Note  Patient Details  Name: [name] MRN: [id number] Date of Birth: 1940/12/25 Today's Date: 10/07/2013  Time: 1330-1400 30 minutes (missed 15 minutes due to fatigue)  1:1 No c/o pain.  Treatment session focused on balance training.  Standing static balance without UE support, pt only able to maintain x 30 seconds before fatigue.  Standing reaching task to simulate reaching low and high items at home, pt able to perform with min A for balance, requires frequent seated rests.  Standing tap ups and step ups for balance with min-mod A, frequent rests.  Pt then unable to complete PT session as she states "I'm so tired by legs won't work".  Pt requires mod-max A for transfer to w/c.  Pt seated in w/c with quick release belt to rest before next PT session.   Keontae Levingston 10/07/2013, 2:02 PM

## 2013-10-07 NOTE — Progress Notes (Signed)
Physical Therapy Session Note  Patient Details  Name: [name] MRN: [id number] Date of Birth: [birth year]  Today's Date: 10/07/2013 Time: 0730-0830 Time Calculation (min): 60 min  Short Term Goals: Week 1:  PT Short Term Goal 1 (Week 1): = LTGs  Skilled Therapeutic Interventions/Progress Updates:    Session focused on transfers and ambulation with rollator walker. Supine> sit min assist, pt gets "stuck" just prior to reaching fully upright position, has most difficulty with scooting bottom forwards or backwards in chair. Stand pivot transfers x 2 with min-guard assist and rollator. All sit <> stands from bed, 3n1, and low couch performed with min-guard/min assist. Ambulation 2 x 130' with min-guard assist and rollator walker cues for bil hip abduction and safe proximity to rollator, min cues to remember with second bout. 10 sit <> stands for conditioning from low couch, min-guard assist.   Discussed daily routine - pt normally ambulates throughout the home with rollator and always eats while sitting in her recliner. Pt reports she has a cat therefore will need to be able to reach to lower levels to tend to it. Pt reports her day at home is "done" at 3 or 5pm, normally stays in bed after this.   Second Session Time: 1435- 1500  Time Calculation (min): 25 min Skilled Therapeutic Interventions/Progress Updates:  Very fatigued but willing to try. Sit <> stands required more time, still min assist. Bil LEs tend to maintain extended position with feet off floor while sitting in chair, takes lots of effort to be able to flex knees to place feet on the floor. Worked on reciprocal scooting in the chair and also standing balance forward foot taps x 2 bouts with bil. UE support, pt needs encouragement as she is fearful of falling. Wheelchair positioning for transfers, max assist. Stand pivot transfer min assist with one HHA. Sit> supine min assist.   Therapy Documentation Precautions:   Precautions Precautions: Fall Precaution Comments: reports fall  x 2 in November 14 (h/o Parkinson's Disease) Restrictions Weight Bearing Restrictions: No Pain:  no c/o pain either session  See FIM for current functional status  Therapy/Group: Individual Therapy both sessions  [place] Rocker 10/07/2013, 12:18 PM

## 2013-10-07 NOTE — Progress Notes (Signed)
Occupational Therapy Session Note  Patient Details  Name: [name] MRN: [id number] Date of Birth: 1941/05/07  Today's Date: 10/07/2013 Time: 0900-1000 Time Calculation (min): 60 min  Short Term Goals: Week 1:  OT Short Term Goal 1 (Week 1): STG=LTG due to short length of stay  Skilled Therapeutic Interventions: ADL-retraining with focus on functional mobility, transfers, endurance and dynamic standing balance during ADL.   Patient able to ambulate to bathroom using 4-wheeled walker with supervision assist and completed stand-pivot transfer to tub bench with min assist.   Patient performed bathing upper body seated and performed sit>stand to wash peri-area and buttocks but was unable to reach her feet to bathe due limited reach and instability from excessive seat height of tub bench.   Patient performed dressing while seated in w/c and was educated on use of elastic shoe laces to increase independence with donning shoes.    Patient required only min assist to dress upper body but required total assist for lower body dressing due to need for extra time to rest and limited skills with use of AE.     Therapy Documentation Precautions:  Precautions Precautions: Fall Precaution Comments: reports fall  x 2 in November 14 (h/o Parkinson's Disease) Restrictions Weight Bearing Restrictions: No   Pain: No pain   ADL: ADL ADL Comments: see FIM  See FIM for current functional status  Therapy/Group: Individual Therapy  [place] 10/07/2013, 10:36 AM

## 2013-10-08 ENCOUNTER — Inpatient Hospital Stay (HOSPITAL_COMMUNITY): Payer: Medicare Other | Admitting: *Deleted

## 2013-10-08 ENCOUNTER — Inpatient Hospital Stay (HOSPITAL_COMMUNITY): Payer: Medicare Other

## 2013-10-08 ENCOUNTER — Inpatient Hospital Stay (HOSPITAL_COMMUNITY): Payer: Medicare Other | Admitting: Physical Therapy

## 2013-10-08 DIAGNOSIS — K922 Gastrointestinal hemorrhage, unspecified: Secondary | ICD-10-CM

## 2013-10-08 DIAGNOSIS — G20 Parkinson's disease: Secondary | ICD-10-CM

## 2013-10-08 DIAGNOSIS — R5381 Other malaise: Secondary | ICD-10-CM

## 2013-10-08 MED ORDER — CEPASTAT 14.5 MG MT LOZG
1.0000 | LOZENGE | OROMUCOSAL | Status: DC | PRN
  Administered 2013-10-09: 1 via BUCCAL
  Filled 2013-10-08: qty 18

## 2013-10-08 NOTE — IPOC Note (Signed)
Overall Plan of Care [facility]) Patient Details Name: [name] MRN: [id number] DOB: 1941/01/18  Admitting Diagnosis: GI BLEED, [place] [facility] Problems: Active Problems:   Physical deconditioning     Functional Problem List: Nursing Bladder;Bowel;Endurance;Pain;Skin Integrity  PT Balance;Endurance;Motor;Skin Integrity  OT Balance;Endurance;Motor  SLP    TR         Basic ADL's: OT Bathing;Dressing;Toileting     Advanced  ADL's: OT Laundry;Simple Meal Preparation     Transfers: PT Bed Mobility;Bed to Chair;Car;Furniture  OT Toilet;Tub/Shower     Locomotion: PT Ambulation;Stairs     Additional Impairments: OT    SLP        TR      Anticipated Outcomes Item Anticipated Outcome  Self Feeding Mod I  Swallowing      Basic self-care  Mod I  Toileting  Mod I   Bathroom Transfers Mod I  Bowel/Bladder  Continent of bowel aqnd bladder with min assist  Transfers  mod I overall; S car  Locomotion  mod I household gait; S ramp  Communication     Cognition     Pain  pain less than or equal to 3 on a acale of 0-10  Safety/Judgment  Pt will remain free from fall/injury with min assist   Therapy Plan: PT Intensity: Minimum of 1-2 x/day ,45 to 90 minutes PT Frequency: 5 out of 7 days PT Duration Estimated Length of Stay: 7-10 days OT Intensity: Minimum of 1-2 x/day, 45 to 90 minutes OT Frequency: 5 out of 7 days OT Duration/Estimated Length of Stay: 7-10 days         Team Interventions: Nursing Interventions Patient/Family Education;Bladder Management;Bowel Management;Pain Management;Medication Management;Skin Care/Wound Management;Discharge Planning  PT interventions Ambulation/gait training;Balance/vestibular training;Community reintegration;Discharge planning;Disease management/prevention;DME/adaptive equipment instruction;Functional mobility training;Neuromuscular re-education;Pain management;Patient/family education;Psychosocial support;Skin  care/wound management;Splinting/orthotics;Stair training;Therapeutic Activities;Therapeutic Exercise;UE/LE Strength taining/ROM;UE/LE Coordination activities;Wheelchair propulsion/positioning  OT Interventions Functional mobility training;Self Care/advanced ADL retraining;Therapeutic Activities;Therapeutic Exercise;UE/LE Strength taining/ROM;Discharge planning;Balance/vestibular training;DME/adaptive equipment instruction  SLP Interventions    TR Interventions    SW/CM Interventions Discharge Planning;Psychosocial Support;Patient/Family Education    Team Discharge Planning: Destination: PT-Home ,OT- Home , SLP-  Projected Follow-up: PT-Home health PT, OT-  Home health OT, SLP-  Projected Equipment Needs: PT-None recommended by PT, OT- None recommended by OT, SLP-  Equipment Details: PT-Pt already has rollator, OT-has BSC and tub bench Patient/family involved in discharge planning: PT- Patient,  OT-Patient, SLP-   MD ELOS: 7-10 days Medical Rehab Prognosis:  Excellent Assessment: The patient has been admitted for CIR therapies. The team will be addressing, functional mobility, strength, stamina, balance, safety, adaptive techniques/equipment, self-care, bowel and bladder mgt, patient and caregiver education, NMR, medication mgt/adjustment, pain control, posture. Goals have been set at mod I for mobility and self-care.    [name], MD, FAAPMR      See Team Conference Notes for weekly updates to the plan of care

## 2013-10-08 NOTE — Progress Notes (Signed)
Occupational Therapy Session Note  Patient Details  Name: [name] MRN: [id number] Date of Birth: 1941-01-14  Today's Date: 10/08/2013 Time: 0730-0815 Short Term Goals: Week 1:  OT Short Term Goal 1 (Week 1): STG=LTG due to short length of stay  Skilled Therapeutic Interventions: ADL-retraining with focus on bed mobility, functional transfer (bed > w/c), sit <> stand, and adapted bathing/dressing.   Patient completed bed mobility with only supervision and verbal cues to use bed rails, transferred to w/c, and performed bathing, sitting and standing at sink with only setup assist.   Pt completed upper body dressing with setup assist but required min assist with lower body dressing due to time constraints.    Therapy Documentation Precautions:  Precautions Precautions: Fall Precaution Comments: reports fall  x 2 in November 14 (h/o Parkinson's Disease) Restrictions Weight Bearing Restrictions: No  Vital Signs: Therapy Vitals Temp: 97.6 F (36.4 C) Pulse Rate: 71 Resp: 17 BP: 110/63 mmHg Oxygen Therapy SpO2: 97 %  Pain: Pain Assessment Pain Assessment: No/denies pain  ADL: ADL ADL Comments: see FIM  See FIM for current functional status  Therapy/Group: Individual Therapy  Second session: Time: 1345-1430 Time Calculation (min):  45 min  Pain Assessment: No pain  Skilled Therapeutic Interventions: ADL-retraining with focus on improved endurance, transfers, and w/c mobility.   Patient received in her recliner, reporting increased fatigue from prior rehab therapy treatments.   With extra time and several rest breaks, patient accepted challenge to attempt functional mobility from w/c to ADL bathtub w/bench to attempt tub bench transfer.  She required mod assist to rise from sitting, and steadying assist to ambulate with 4-ww and sit on bench.   Patient rested on bench but was unable to tolerate sitting after 3 minutes and would not attempt to pivot to bring her feet into tub  d/t fear of falling.   From ADL bathroom, patient required max assist to return to w/c, propel w/c to her room,  and to return to bed from w/c, including all bed mobility.    See FIM for current functional status  Therapy/Group: Individual Therapy  [place] 10/08/2013, 7:01 AM

## 2013-10-08 NOTE — Progress Notes (Addendum)
Physical Therapy Session Note  Patient Details  Name: [name] MRN: [id number] Date of Birth: January 10, 1941  Today's Date: 10/08/2013 Time: 0830-0905 Time Calculation (min): 35 min  Short Term Goals: Week 1:  PT Short Term Goal 1 (Week 1): = LTGs  Skilled Therapeutic Interventions/Progress Updates:    Session focused on dynamic balance. Ambulation with increased step length 2 x 150' with rollator min assist cues for safe proximity to rollator and step width. Wheelchair propulsion in home environment and controlled environment x 30' in hemi-height wheelchair, up to mod assist for narrow space turns and negotiation. Standing reaching task from low surface to limits of stability laterally without UE support. Pt very anxious without UE support. Extended rest breaks needed.Pt missed last 10 min due to fatigue.   Therapy Documentation Precautions:  Precautions Precautions: Fall Precaution Comments: reports fall  x 2 in November 14 (h/o Parkinson's Disease) Restrictions Weight Bearing Restrictions: No Pain:  no c/o  See FIM for current functional status  Therapy/Group: Individual Therapy  L[place] Rocker 10/08/2013, 3:45 PM

## 2013-10-08 NOTE — Progress Notes (Addendum)
Physical Therapy Note  Patient Details  Name: [name] MRN: [id number] Date of Birth: Nov 04, 1940 Today's Date: 10/08/2013  Time: 1100-1200, 60 min Patient received sitting in recliner. No c/o pain. Group therapy session with emphasis on LE therex, sit<>stand transfers (minA), NuStep Level 2 with B UE and LE x10'. LE therex in sitting: hip flexion, add and knee ext, ankle DF/PF, x25 each LE. Patient left seated in wheelchair with seatbelt donned and all needs within reach.   [place] Mayce Noyes, PT, DPT 10/08/2013, 12:07 PM

## 2013-10-08 NOTE — Progress Notes (Signed)
Subjective/Complaints: Had a quiet evening. Likes to go bed quite early in the evening (per home routine)---fatigues in the afternoon with therapy as a result A 12 point review of systems has been performed and if not noted above is otherwise negative.   Objective: Vital Signs: Blood pressure 110/63, pulse 71, temperature 97.6 F (36.4 C), temperature source Oral, resp. rate 17, height 5\' 4"  (1.626 m), weight 79.1 kg (174 lb 6.1 oz), SpO2 97.00%. No results found.  Recent Labs  10/06/13 0606  WBC 8.9  HGB 8.7*  HCT 27.2*  PLT 185    Recent Labs  10/06/13 0606  NA 136*  K 4.6  CL 100  GLUCOSE 95  BUN 18  CREATININE 0.85  CALCIUM 8.7   CBG (last 3)  No results found for this basename: GLUCAP,  in the last 72 hours  Wt Readings from Last 3 Encounters:  10/08/13 79.1 kg (174 lb 6.1 oz)  10/05/13 82.056 kg (180 lb 14.4 oz)  10/05/13 82.056 kg (180 lb 14.4 oz)    Physical Exam:  Constitutional: She is oriented to person, place, and time.   HENT: oral mucosa generally pink and moist, dentition poor Head: Normocephalic and atraumatic.  Eyes: Conjunctivae are normal. Pupils are equal, round, and reactive to light.  Neck: Normal range of motion. Neck supple.  Cardiovascular: Regular rhythm. No murmurs rubs or galllops Respiratory: Effort normal and breath sounds normal. No respiratory distress. She has no wheezes or rales.  GI: Soft. Bowel sounds are normal. She exhibits no distension. There is no tenderness.  Musculoskeletal: She exhibits no edema and no tenderness.  Diffuse ecchymotic area along left forearm improving Neurological: She is more alert and oriented to person, place, and time.  Ataxic speech--  Persistent, intermittent BUE and LUE tremors, head bobbing , rhythmic movements, low frequency. No rigidity. Fair insight and awareness. Memory good. Strength remains 4/5 UE deltoid, bicep, tricep, HI. LE 3+ HF,4- to 4 KE, 4/5 ankle dorsi and plantar flexion. No  gross sensory deficits  Skin: Skin is warm and dry.  Psychiatric: She has a normal mood and affect. Her behavior is normal. Thought content normal   Assessment/Plan: 1. Functional deficits secondary to pseudoexacerbation of parkinson's disease/ gait disorder which require 3+ hours per day of interdisciplinary therapy in a comprehensive inpatient rehab setting. Physiatrist is providing close team supervision and 24 hour management of active medical problems listed below. Physiatrist and rehab team continue to assess barriers to discharge/monitor patient progress toward functional and medical goals.  Therapy activities front loaded to the am to help with tolerance.  FIM: FIM - Bathing Bathing Steps Patient Completed: Chest;Left Arm;Right Arm;Abdomen;Front perineal area;Buttocks;Right upper leg;Left upper leg Bathing: 4: Min-Patient completes 8-9 54f 10 parts or 75+ percent  FIM - Upper Body Dressing/Undressing Upper body dressing/undressing steps patient completed: Thread/unthread right bra strap;Thread/unthread left bra strap;Hook/unhook bra;Thread/unthread right sleeve of pullover shirt/dresss;Thread/unthread left sleeve of pullover shirt/dress;Put head through opening of pull over shirt/dress Upper body dressing/undressing: 4: Min-Patient completed 75 plus % of tasks FIM - Lower Body Dressing/Undressing Lower body dressing/undressing steps patient completed: Thread/unthread right underwear leg;Thread/unthread left underwear leg;Pull underwear up/down;Thread/unthread right pants leg;Thread/unthread left pants leg;Pull pants up/down Lower body dressing/undressing: 1: Total-Patient completed less than 25% of tasks  FIM - Toileting Toileting steps completed by patient: Adjust clothing prior to toileting;Performs perineal hygiene;Adjust clothing after toileting Toileting: 5: Supervision: Safety issues/verbal cues  FIM - Radio producer Devices: Bedside  commode Toilet Transfers: 4-To  toilet/BSC: Min A (steadying Pt. > 75%);4-From toilet/BSC: Min A (steadying Pt. > 75%)  FIM - Bed/Chair Transfer Bed/Chair Transfer Assistive Devices: Copy: 4: Supine > Sit: Min A (steadying Pt. > 75%/lift 1 leg);4: Sit > Supine: Min A (steadying pt. > 75%/lift 1 leg);4: Bed > Chair or W/C: Min A (steadying Pt. > 75%);4: Chair or W/C > Bed: Min A (steadying Pt. > 75%)  FIM - Locomotion: Wheelchair Locomotion: Wheelchair: 1: Travels less than 50 ft with maximal assistance (Pt: 25 - 49%) FIM - Locomotion: Ambulation Locomotion: Ambulation Assistive Devices: Administrator Ambulation/Gait Assistance: 4: Min assist;4: Min guard Locomotion: Ambulation: 2: Travels 50 - 149 ft with minimal assistance (Pt.>75%)  Comprehension Comprehension Mode: Auditory Comprehension: 6-Follows complex conversation/direction: With extra time/assistive device  Expression Expression Mode: Verbal Expression: 5-Expresses basic needs/ideas: With extra time/assistive device  Social Interaction Social Interaction: 6-Interacts appropriately with others with medication or extra time (anti-anxiety, antidepressant).  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 DVT Prophylaxis/Anticoagulation: Mechanical: Sequential compression devices, below knee Bilateral lower extremities  2. Pain Management: prn tylenol adequate 3. Mood: has good outlook. Will have LCSW follow for evaluation.  4. Neuropsych: This patient is capable of making decisions on her own behalf.  5. GIB: hgb increased today.  Monitor for recurrent melena. Continue protonix bid.  6. Parkinson's disease: Will continue sinemet qid and Mirapex.  -observing with decreased mirapex   to see if it helps with dyskinesias (discussed with patient) 7. Fluid overload: Continue lasix daily.  -daily I's and O's as well as weights -appears balanced at present   LOS (Days) 3 A FACE TO FACE EVALUATION WAS PERFORMED  SWARTZ,ZACHARY T 10/08/2013 8:12 AM

## 2013-10-09 DIAGNOSIS — K922 Gastrointestinal hemorrhage, unspecified: Secondary | ICD-10-CM

## 2013-10-09 DIAGNOSIS — G20A1 Parkinson's disease without dyskinesia, without mention of fluctuations: Secondary | ICD-10-CM

## 2013-10-09 DIAGNOSIS — R5381 Other malaise: Secondary | ICD-10-CM

## 2013-10-09 DIAGNOSIS — G20 Parkinson's disease: Secondary | ICD-10-CM

## 2013-10-09 MED ORDER — PRAMIPEXOLE DIHYDROCHLORIDE 0.25 MG PO TABS
0.5000 mg | ORAL_TABLET | Freq: Three times a day (TID) | ORAL | Status: DC
  Administered 2013-10-09 – 2013-10-10 (×3): 0.5 mg via ORAL
  Filled 2013-10-09 (×6): qty 2

## 2013-10-09 NOTE — Progress Notes (Addendum)
Subjective/Complaints: Pt still with uncontrolled motion, no rigidity like she had at admit A 12 point review of systems has been performed and if not noted above is otherwise negative.   Objective: Vital Signs: Blood pressure 138/60, pulse 80, temperature 97.6 F (36.4 C), temperature source Oral, resp. rate 17, height 5\' 4"  (1.626 m), weight 78.5 kg (173 lb 1 oz), SpO2 100.00%. No results found. No results found for this basename: WBC, HGB, HCT, PLT,  in the last 72 hours No results found for this basename: NA, K, CL, CO, GLUCOSE, BUN, CREATININE, CALCIUM,  in the last 72 hours CBG (last 3)  No results found for this basename: GLUCAP,  in the last 72 hours  Wt Readings from Last 3 Encounters:  10/09/13 78.5 kg (173 lb 1 oz)  10/05/13 82.056 kg (180 lb 14.4 oz)  10/05/13 82.056 kg (180 lb 14.4 oz)    Physical Exam:  Constitutional: She is oriented to person, place, and time.   HENT: oral mucosa generally pink and moist, dentition poor Head: Normocephalic and atraumatic.  Eyes: Conjunctivae are normal. Pupils are equal, round, and reactive to light.  Neck: Normal range of motion. Neck supple.  Cardiovascular: Regular rhythm. No murmurs rubs or galllops Respiratory: Effort normal and breath sounds normal. No respiratory distress. She has no wheezes or rales.  GI: Soft. Bowel sounds are normal. She exhibits no distension. There is no tenderness.  Musculoskeletal: She exhibits no edema and no tenderness.  Diffuse ecchymotic area along left forearm improving Neurological: She is more alert and oriented to person, place, and time.  Ataxic speech--  Persistent, intermittent BUE and LUE tremors, head bobbing , rhythmic movements, low frequency. No rigidity. Fair insight and awareness. Memory good. Strength remains 4/5 UE deltoid, bicep, tricep, HI. LE 3+ HF,4- to 4 KE, 4/5 ankle dorsi and plantar flexion. No gross sensory deficits  Skin: Skin is warm and dry.  Psychiatric: She has a  normal mood and affect. Her behavior is normal. Thought content normal   Assessment/Plan: 1. Functional deficits secondary to pseudoexacerbation of parkinson's disease/ gait disorder which require 3+ hours per day of interdisciplinary therapy in a comprehensive inpatient rehab setting. Physiatrist is providing close team supervision and 24 hour management of active medical problems listed below. Physiatrist and rehab team continue to assess barriers to discharge/monitor patient progress toward functional and medical goals.   FIM: FIM - Bathing Bathing Steps Patient Completed: Chest;Right Arm;Left Arm;Abdomen;Front perineal area;Buttocks;Right upper leg;Left upper leg Bathing: 4: Min-Patient completes 8-9 45f 10 parts or 75+ percent  FIM - Upper Body Dressing/Undressing Upper body dressing/undressing steps patient completed: Thread/unthread right bra strap;Thread/unthread left bra strap;Hook/unhook bra;Thread/unthread right sleeve of pullover shirt/dresss;Thread/unthread left sleeve of pullover shirt/dress;Put head through opening of pull over shirt/dress;Pull shirt over trunk Upper body dressing/undressing: 5: Set-up assist to: Obtain clothing/put away FIM - Lower Body Dressing/Undressing Lower body dressing/undressing steps patient completed: Pull underwear up/down;Pull pants up/down;Don/Doff right shoe;Don/Doff left shoe;Thread/unthread right underwear leg;Thread/unthread left underwear leg;Thread/unthread right pants leg;Thread/unthread left pants leg Lower body dressing/undressing: 4: Min-Patient completed 75 plus % of tasks  FIM - Toileting Toileting steps completed by patient: Adjust clothing prior to toileting;Performs perineal hygiene;Adjust clothing after toileting Toileting Assistive Devices: Grab bar or rail for support Toileting: 6: More than reasonable amount of time  FIM - Radio producer Devices: Recruitment consultant Transfers: 1-Two helpers  (per Regino Bellow, [place])  FIM - Bed/Chair Transfer Bed/Chair Transfer Assistive Devices: Walker Bed/Chair Transfer: 4: Bed >  Chair or W/C: Min A (steadying Pt. > 75%);4: Chair or W/C > Bed: Min A (steadying Pt. > 75%)  FIM - Locomotion: Wheelchair Locomotion: Wheelchair: 1: Total Assistance/staff pushes wheelchair (Pt<25%) FIM - Locomotion: Ambulation Locomotion: Ambulation Assistive Devices: Administrator Ambulation/Gait Assistance: 4: Min assist;4: Min guard Locomotion: Ambulation: 4: Travels 150 ft or more with minimal assistance (Pt.>75%)  Comprehension Comprehension Mode: Auditory Comprehension: 6-Follows complex conversation/direction: With extra time/assistive device  Expression Expression Mode: Verbal Expression: 5-Expresses complex 90% of the time/cues < 10% of the time  Social Interaction Social Interaction: 5-Interacts appropriately 90% of the time - Needs monitoring or encouragement for participation or interaction.  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 DVT Prophylaxis/Anticoagulation: Mechanical: Sequential compression devices, below knee Bilateral lower extremities  2. Pain Management: prn tylenol adequate 3. Mood: has good outlook. Will have LCSW follow for evaluation.  4. Neuropsych: This patient is capable of making decisions on her own behalf.  5. GIB: hgb increased today.  Monitor for recurrent melena. Continue protonix bid.  6. Parkinson's disease: Will continue sinemet qid and Mirapex.  -observing with decreased mirapex  to see if it helps with dyskinesias (discussed with patient), will reduce further 7. Fluid overload: Continue lasix daily.  -daily I's and O's as well as weights -appears balanced at present   LOS (Days)  4 A FACE TO FACE EVALUATION WAS PERFORMED  [name] E 10/09/2013 11:07 AM

## 2013-10-10 ENCOUNTER — Inpatient Hospital Stay (HOSPITAL_COMMUNITY): Payer: Medicare Other | Admitting: Physical Therapy

## 2013-10-10 ENCOUNTER — Inpatient Hospital Stay (HOSPITAL_COMMUNITY): Payer: Medicare Other | Admitting: Occupational Therapy

## 2013-10-10 MED ORDER — PRAMIPEXOLE DIHYDROCHLORIDE 0.25 MG PO TABS
0.7500 mg | ORAL_TABLET | Freq: Three times a day (TID) | ORAL | Status: DC
  Administered 2013-10-10 – 2013-10-12 (×6): 0.75 mg via ORAL
  Filled 2013-10-10 (×9): qty 3

## 2013-10-10 NOTE — Progress Notes (Addendum)
Occupational Therapy Session Note  Patient Details  Name: [name] MRN: [id number] Date of Birth: 1941/01/08  Today's Date: 10/10/2013  First session: Time: 1130-1215 Time Calculation (min): 45 min   Skilled Therapeutic Interventions/Progress Updates: first session: ADL in room shower with focus on endurance functional mobility via rollator.  Patient completed safe lateral leans on tub transfer bench for pericleansing and drying.  Patient  Stated that she often has tremors, but none were exhibited during her morning session.  Patient was overall safe and S to steadying assist (steadying assist for standing as she fatigued toward the end of the session).    2nd session:11:30-12:15   Patient with moderate tremors and rigidity in bilateral LEs and mild tremors in UEs upon approach.  This clinician provided stretching to LEs and then had patient stand to complete bearing in UEs and LEs.  After a few minutes rigidity ceased.  Patient stated she had just spoken to the rehab MD who said he would adjust her medication to assist with decreasing the rigidity.  Patient was able to go on to ADL apt and complete tub transfer bench transfer into the tub shower via w/c with close S.  No further tremors or rigidity were noted during this approx 45 min session.    Therapy Documentation Precautions:  Precautions Precautions: Fall Precaution Comments: reports fall  x 2 in November 14 (h/o Parkinson's Disease) Restrictions Weight Bearing Restrictions: No  Pain:denied    See FIM for current functional status  Therapy/Group: Individual Therapy  Alfredia Ferguson [facility] 10/10/2013, 5:01 PM

## 2013-10-10 NOTE — Progress Notes (Signed)
Physical Therapy Note  Patient Details  Name: [name] MRN: [id number] Date of Birth: 1941/04/22 Today's Date: 10/10/2013  [id number] (60 minutes) individual Pain: no reported pain Focus of treatment: Therapeutic exercise focused on bilateral LE strengthening/ activity tolerance; gait training ; wc mobility training Treatment: Pt in recliner upon arrival; recliner to bathroom close SBA rollator; pt performed hygiene independently post toileting; wc mobility - 35 feet with mod vcs for technique and increased time; transfer wc to Nustep from low wc min to close SBA with vcs for hand placement sit >< stand; Nustep Level 4 X 10 minutes; up/down 4 inch step with RW support X 10 for quad strengthening; alternate stepping to 6 inch step for hip flexor strengthening ; sit to stand from mat x 5 focusing on erect standing; returned to room with quick release belt in place.  1300 Pt missed 60 minutes of PT session secondary to late lunch.     Kailash Hinze,JIM 10/10/2013, 8:51 AM

## 2013-10-10 NOTE — Progress Notes (Signed)
Subjective/Complaints: Pt with less uncontrolled motion, now with slow movements and  rigidity like she had at admit A 12 point review of systems has been performed and if not noted above is otherwise negative.   Objective: Vital Signs: Blood pressure 143/63, pulse 73, temperature 97.3 F (36.3 C), temperature source Oral, resp. rate 18, height 5\' 4"  (1.626 m), weight 80.8 kg (178 lb 2.1 oz), SpO2 99.00%. No results found. No results found for this basename: WBC, HGB, HCT, PLT,  in the last 72 hours No results found for this basename: NA, K, CL, CO, GLUCOSE, BUN, CREATININE, CALCIUM,  in the last 72 hours CBG (last 3)  No results found for this basename: GLUCAP,  in the last 72 hours  Wt Readings from Last 3 Encounters:  10/10/13 80.8 kg (178 lb 2.1 oz)  10/05/13 82.056 kg (180 lb 14.4 oz)  10/05/13 82.056 kg (180 lb 14.4 oz)    Physical Exam:  Constitutional: She is oriented to person, place, and time.   HENT: oral mucosa generally pink and moist, dentition poor Head: Normocephalic and atraumatic.  Eyes: Conjunctivae are normal. Pupils are equal, round, and reactive to light.  Neck: Normal range of motion. Neck supple.  Cardiovascular: Regular rhythm. No murmurs rubs or galllops Respiratory: Effort normal and breath sounds normal. No respiratory distress. She has no wheezes or rales.  GI: Soft. Bowel sounds are normal. She exhibits no distension. There is no tenderness.  Musculoskeletal: She exhibits no edema and no tenderness.  Diffuse ecchymotic area along left forearm improving Neurological: She is more alert and oriented to person, place, and time.  Ataxic speech--  Persistent, intermittent BUE and LUE tremors, head bobbing , rhythmic movements, low frequency. No rigidity. Fair insight and awareness. Memory good. Strength remains 4/5 UE deltoid, bicep, tricep, HI. LE 3+ HF,4- to 4 KE, 4/5 ankle dorsi and plantar flexion. No gross sensory deficits  Skin: Skin is warm and dry.   Psychiatric: She has a normal mood and affect. Her behavior is normal. Thought content normal   Assessment/Plan: 1. Functional deficits secondary to pseudoexacerbation of parkinson's disease/ gait disorder which require 3+ hours per day of interdisciplinary therapy in a comprehensive inpatient rehab setting. Physiatrist is providing close team supervision and 24 hour management of active medical problems listed below. Physiatrist and rehab team continue to assess barriers to discharge/monitor patient progress toward functional and medical goals.   FIM: FIM - Bathing Bathing Steps Patient Completed: Chest;Right Arm;Left Arm;Abdomen;Front perineal area;Buttocks;Right upper leg;Left upper leg Bathing: 4: Min-Patient completes 8-9 66f 10 parts or 75+ percent  FIM - Upper Body Dressing/Undressing Upper body dressing/undressing steps patient completed: Thread/unthread right bra strap;Thread/unthread left bra strap;Hook/unhook bra;Thread/unthread right sleeve of pullover shirt/dresss;Thread/unthread left sleeve of pullover shirt/dress;Put head through opening of pull over shirt/dress;Pull shirt over trunk Upper body dressing/undressing: 5: Set-up assist to: Obtain clothing/put away FIM - Lower Body Dressing/Undressing Lower body dressing/undressing steps patient completed: Pull underwear up/down;Pull pants up/down;Don/Doff right shoe;Don/Doff left shoe;Thread/unthread right underwear leg;Thread/unthread left underwear leg;Thread/unthread right pants leg;Thread/unthread left pants leg Lower body dressing/undressing: 4: Min-Patient completed 75 plus % of tasks  FIM - Toileting Toileting steps completed by patient: Adjust clothing prior to toileting;Performs perineal hygiene;Adjust clothing after toileting Toileting Assistive Devices: Grab bar or rail for support Toileting: 6: More than reasonable amount of time  FIM - Radio producer Devices: Recruitment consultant  Transfers: 1-Two helpers (per Regino Bellow, [place])  FIM - Engineer, site Assistive Devices:  Walker Bed/Chair Transfer: 4: Bed > Chair or W/C: Min A (steadying Pt. > 75%);4: Chair or W/C > Bed: Min A (steadying Pt. > 75%)  FIM - Locomotion: Wheelchair Locomotion: Wheelchair: 1: Total Assistance/staff pushes wheelchair (Pt<25%) FIM - Locomotion: Ambulation Locomotion: Ambulation Assistive Devices: Administrator Ambulation/Gait Assistance: 4: Min assist;4: Min guard Locomotion: Ambulation: 4: Travels 150 ft or more with minimal assistance (Pt.>75%)  Comprehension Comprehension Mode: Auditory Comprehension: 6-Follows complex conversation/direction: With extra time/assistive device  Expression Expression Mode: Verbal Expression: 5-Expresses complex 90% of the time/cues < 10% of the time  Social Interaction Social Interaction: 5-Interacts appropriately 90% of the time - Needs monitoring or encouragement for participation or interaction.  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 DVT Prophylaxis/Anticoagulation: Mechanical: Sequential compression devices, below knee Bilateral lower extremities  2. Pain Management: prn tylenol adequate 3. Mood: has good outlook. Will have LCSW follow for evaluation.  4. Neuropsych: This patient is capable of making decisions on her own behalf.  5. GIB: hgb increased today.  Monitor for recurrent melena. Continue protonix bid.  6. Parkinson's disease: Will continue sinemet qid and Mirapex.  -increase  mirapex  to see if it helps with rigidity without increasing dyskinesias, change to .75mg  TID 7. Fluid overload: Continue lasix daily.  -daily I's and O's as well as weights -appears balanced at  present   LOS (Days) 5 A FACE TO FACE EVALUATION WAS PERFORMED  Alysia Penna E 10/10/2013 11:14 AM

## 2013-10-11 ENCOUNTER — Inpatient Hospital Stay (HOSPITAL_COMMUNITY): Payer: Medicare Other

## 2013-10-11 ENCOUNTER — Inpatient Hospital Stay (HOSPITAL_COMMUNITY): Payer: Medicare Other | Admitting: Physical Therapy

## 2013-10-11 ENCOUNTER — Telehealth: Payer: Self-pay | Admitting: Gastroenterology

## 2013-10-11 NOTE — Telephone Encounter (Signed)
lvm for pt to call me back regarding EGD results

## 2013-10-11 NOTE — Progress Notes (Signed)
Subjective/Complaints: Less rigid today after re-adjustment of mirapex. A 12 point review of systems has been performed and if not noted above is otherwise negative.   Objective: Vital Signs: Blood pressure 130/59, pulse 69, temperature 98.1 F (36.7 C), temperature source Oral, resp. rate 18, height 5\' 4"  (1.626 m), weight 80.2 kg (176 lb 12.9 oz), SpO2 98.00%. No results found. No results found for this basename: WBC, HGB, HCT, PLT,  in the last 72 hours No results found for this basename: NA, K, CL, CO, GLUCOSE, BUN, CREATININE, CALCIUM,  in the last 72 hours CBG (last 3)  No results found for this basename: GLUCAP,  in the last 72 hours  Wt Readings from Last 3 Encounters:  10/11/13 80.2 kg (176 lb 12.9 oz)  10/05/13 82.056 kg (180 lb 14.4 oz)  10/05/13 82.056 kg (180 lb 14.4 oz)    Physical Exam:  Constitutional: She is oriented to person, place, and time.   HENT: oral mucosa generally pink and moist, dentition poor Head: Normocephalic and atraumatic.  Eyes: Conjunctivae are normal. Pupils are equal, round, and reactive to light.  Neck: Normal range of motion. Neck supple.  Cardiovascular: Regular rhythm. No murmurs rubs or galllops Respiratory: Effort normal and breath sounds normal. No respiratory distress. She has no wheezes or rales.  GI: Soft. Bowel sounds are normal. She exhibits no distension. There is no tenderness.  Musculoskeletal: She exhibits no edema and no tenderness.  Diffuse ecchymotic area along left forearm improving Neurological: She is more alert and oriented to person, place, and time.  Ataxic speech--  Persistent, intermittent BUE and LUE tremors No rigidity. Still some dyskinesias. Fair insight and awareness. Memory good. Strength remains 4/5 UE deltoid, bicep, tricep, HI. LE 3+ HF,4- to 4 KE, 4/5 ankle dorsi and plantar flexion. No gross sensory deficits  Skin: Skin is warm and dry.  Psychiatric: She has a normal mood and affect. Her behavior is  normal. Thought content normal   Assessment/Plan: 1. Functional deficits secondary to pseudoexacerbation of parkinson's disease/ gait disorder which require 3+ hours per day of interdisciplinary therapy in a comprehensive inpatient rehab setting. Physiatrist is providing close team supervision and 24 hour management of active medical problems listed below. Physiatrist and rehab team continue to assess barriers to discharge/monitor patient progress toward functional and medical goals.   FIM: FIM - Bathing Bathing Steps Patient Completed: Chest;Right Arm;Left Arm;Abdomen;Front perineal area;Buttocks;Right upper leg;Left upper leg;Left lower leg (including foot) Bathing: 4: Min-Patient completes 8-9 66f 10 parts or 75+ percent  FIM - Upper Body Dressing/Undressing Upper body dressing/undressing steps patient completed: Thread/unthread right bra strap;Thread/unthread left bra strap;Hook/unhook bra;Thread/unthread right sleeve of pullover shirt/dresss;Pull shirt over trunk;Thread/unthread left sleeve of pullover shirt/dress;Put head through opening of pull over shirt/dress Upper body dressing/undressing: 5: Supervision: Safety issues/verbal cues FIM - Lower Body Dressing/Undressing Lower body dressing/undressing steps patient completed: Thread/unthread right underwear leg;Thread/unthread left underwear leg;Pull underwear up/down;Pull pants up/down;Fasten/unfasten pants;Don/Doff left shoe;Fasten/unfasten right shoe;Don/Doff right sock;Don/Doff left sock;Don/Doff right shoe;Fasten/unfasten left shoe;Thread/unthread right pants leg;Thread/unthread left pants leg Lower body dressing/undressing: 4: Steadying Assist  FIM - Toileting Toileting steps completed by patient: Adjust clothing prior to toileting;Performs perineal hygiene;Adjust clothing after toileting Toileting Assistive Devices: Grab bar or rail for support Toileting: 0: Activity did not occur  FIM - Engineer, structural Devices: Recruitment consultant Transfers: 0-Activity did not occur  FIM - Control and instrumentation engineer Devices: Adult nurse Transfer: 4: Bed > Chair or W/C: Min A (steadying  Pt. > 75%);4: Chair or W/C > Bed: Min A (steadying Pt. > 75%)  FIM - Locomotion: Wheelchair Locomotion: Wheelchair: 1: Total Assistance/staff pushes wheelchair (Pt<25%) FIM - Locomotion: Ambulation Locomotion: Ambulation Assistive Devices: Administrator Ambulation/Gait Assistance: 4: Min assist;4: Min guard Locomotion: Ambulation: 4: Travels 150 ft or more with minimal assistance (Pt.>75%)  Comprehension Comprehension Mode: Auditory Comprehension: 6-Follows complex conversation/direction: With extra time/assistive device  Expression Expression Mode: Verbal Expression: 5-Expresses complex 90% of the time/cues < 10% of the time  Social Interaction Social Interaction: 5-Interacts appropriately 90% of the time - Needs monitoring or encouragement for participation or interaction.  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 DVT Prophylaxis/Anticoagulation: Mechanical: Sequential compression devices, below knee Bilateral lower extremities  2. Pain Management: prn tylenol adequate 3. Mood: has good outlook. Team to provide egosupport as needed.  4. Neuropsych: This patient is capable of making decisions on her own behalf.  5. GIB: hgb increased    Monitor for recurrent melena. Continue protonix bid.  Check hgb tomorrow 6. Parkinson's disease: Will continue sinemet qid    -continue mirapex at 0.75mg  TID (was on 1.5mg  PTA) 7. Fluid overload: Continue lasix daily.  -daily I's and O's as well as weights -appears balanced at present   LOS (Days) 6 A FACE  TO FACE EVALUATION WAS PERFORMED  SWARTZ,ZACHARY T 10/11/2013 7:56 AM

## 2013-10-11 NOTE — Plan of Care (Signed)
Problem: RH Tub/Shower Transfers Goal: LTG Patient will perform tub/shower transfers w/assist (OT) LTG: Patient will perform tub/shower transfers with assist, with/without cues using equipment (OT)  Outcome: Adequate for Discharge Patient able to transfer successfully 2 times but becomes fearful d/t depth of facility tub and tub bench dissimilar to her tub and bench at home (too high, unstable).

## 2013-10-11 NOTE — Progress Notes (Signed)
Occupational Therapy Note  Patient Details  Name: [name] MRN: [id number] Date of Birth: 1941/02/03 Today's Date: 10/11/2013  Time: 1345-1430 Pt c/o increased back pain while sitting but slightly relieved when ambulating with rollator Individual therapy  Pt engaged in functional ambulation with rollator from therapy gym to ADL.  Pt required rest break before engaging in bed mobility tasks (sit<>supine, and sit<>stand) requiring min A for sit->supine.  Pt amb with rollator to gym and returned to bed requiring mod A for sit->supine.  Focus on activity tolerance, safety awareness, and bed mobility.   Leotis Shames [facility] 10/11/2013, 2:33 PM

## 2013-10-11 NOTE — Progress Notes (Signed)
Physical Therapy Session Note  Patient Details  Name: [name] MRN: [id number] Date of Birth: 1940-11-19  Today's Date: 10/11/2013 Time: 0730-0830 Time Calculation (min): 60 min  Short Term Goals: Week 1:  PT Short Term Goal 1 (Week 1): = LTGs  Skilled Therapeutic Interventions/Progress Updates: No c/o pain   Ambulation to/from ADL apartment with rollator and supervision 2 x ~110'. Home environment mobility going through daily routine (kitchen negotiation, managing food, folding laundry, placing laundry in drawers at various heights, sit <> stands from low couch, toilet, and low kitchen chairs without arm rests) with rollator and then with wheelchair. Pt currently at supervision level.  Discussed safety in the home and how to decrease risk for falls.   Second Session Time:  1300-1350 Time Calculation (min): 50 min Skilled Therapeutic Interventions/Progress Updates:  No c/o pain Ambulation to gym x 150' with rollator and supervision. Obstacle course searching for horseshoes, reaching at various levels and safe use of rollator. Standing balance working on upwards reaching with decreased UE support. Pec stretch during seated rest. Pt very fearful with all balance activity, nearly tearful and unable to continue some tasks.  Therapy Documentation Precautions:  Precautions Precautions: Fall Precaution Comments: reports fall  x 2 in November 14 (h/o Parkinson's Disease) Restrictions Weight Bearing Restrictions: No  See FIM for current functional status  Therapy/Group: Individual Therapy both sessions  Lahoma Rocker 10/11/2013, 3:50 PM

## 2013-10-11 NOTE — Progress Notes (Signed)
Occupational Therapy Session Note  Patient Details  Name: [name] MRN: [id number] Date of Birth: November 04, 1940  Today's Date: 10/11/2013 Time: 6301-6010 Time Calculation (min): 63 min  Short Term Goals: Week 1:  OT Short Term Goal 1 (Week 1): STG=LTG due to short length of stay  Skilled Therapeutic Interventions: ADL-retraining with focus on bath tub transfers, improved endurance, improved dynamic sitting/standing balance, problem-solving, and adapted dressing.   Patient received in her recline and was agreeable to attempt bathing using standard tub and tub bench this session.   Patient escorted to tub in w/c and completed brief functional mobility from w/c to tub bench (15') using 4-ww.   Patient transferred to tub bench with only supervision assist but required mod assist to lift both legs into tub due to fear of falling owing to height of tub and tub bench dissimilar to hers at home.   After completing bathing upper body and legs (to feet) patient refused to attempt to stand in tub or scoot forward to wash peri-area and buttocks d/t fear of falling, despite close contact guard and presence of grab bars.   Patient abruptly terminated bathing and dressing and requested return to her room to complete dressing and recover in bed but required max assist to lift legs out of tub, transfer to w/c, and complete upper body dressing in w/c.   After returning to her room, patient completed sit<>stand 2 times to don underwear and pants with max assist and required copious cues and remotivation to progress to eventually return to supine in bed.   Patient was frequently apologetic although alternating with impatience toward staff (RN and OT).    Therapy Documentation Precautions:  Precautions Precautions: Fall Precaution Comments: reports fall  x 2 in November 14 (h/o Parkinson's Disease) Restrictions Weight Bearing Restrictions: No  Pain: 8/10, low back   ADL: ADL ADL Comments: see FIM  See FIM for  current functional status  Therapy/Group: Individual Therapy  [place] 10/11/2013, 3:17 PM

## 2013-10-12 ENCOUNTER — Inpatient Hospital Stay (HOSPITAL_COMMUNITY): Payer: Medicare Other

## 2013-10-12 ENCOUNTER — Inpatient Hospital Stay (HOSPITAL_COMMUNITY): Payer: Medicare Other | Admitting: Physical Therapy

## 2013-10-12 DIAGNOSIS — R5381 Other malaise: Secondary | ICD-10-CM

## 2013-10-12 DIAGNOSIS — K922 Gastrointestinal hemorrhage, unspecified: Secondary | ICD-10-CM

## 2013-10-12 DIAGNOSIS — G20 Parkinson's disease: Secondary | ICD-10-CM

## 2013-10-12 LAB — CBC
HCT: 27.3 % — ABNORMAL LOW (ref 36.0–46.0)
Hemoglobin: 8.6 g/dL — ABNORMAL LOW (ref 12.0–15.0)
MCH: 26 pg (ref 26.0–34.0)
MCHC: 31.5 g/dL (ref 30.0–36.0)
MCV: 82.5 fL (ref 78.0–100.0)
PLATELETS: 282 10*3/uL (ref 150–400)
RBC: 3.31 MIL/uL — ABNORMAL LOW (ref 3.87–5.11)
RDW: 18.1 % — ABNORMAL HIGH (ref 11.5–15.5)
WBC: 7.9 10*3/uL (ref 4.0–10.5)

## 2013-10-12 MED ORDER — PRAMIPEXOLE DIHYDROCHLORIDE 1 MG PO TABS
1.0000 mg | ORAL_TABLET | Freq: Three times a day (TID) | ORAL | Status: DC
  Administered 2013-10-12 – 2013-10-16 (×12): 1 mg via ORAL
  Filled 2013-10-12 (×15): qty 1

## 2013-10-12 NOTE — Progress Notes (Signed)
Occupational Therapy Session Note  Patient Details  Name: [name] MRN: [id number] Date of Birth: 07-Jul-1941  Today's Date: 10/12/2013 Time: 7672-0947 Time Calculation (min): 57 min  Short Term Goals: Week 1:  OT Short Term Goal 1 (Week 1): STG=LTG due to short length of stay  Skilled Therapeutic Interventions: ADL-retraining with focus on dynamic sitting/standing balance, transfers, and use of AE for lower body dressing.   Patient completed bathing at sink with her feet elevated and supported on step stool, which allowed improved access to wash to feet. Patient donned socks (TEDs) using sock aid and shoes using elastic shoe laces, independently.   Patient completed transfer from recliner to w/c with standby assist.     OT discussed episodes of "freezes" (loss of initiation of movement) with patient and educated patient on one method of visualization practice to enhance problem-solving during ADL.    Therapy Documentation Precautions:  Precautions Precautions: Fall Precaution Comments: reports fall  x 2 in November 14 (h/o Parkinson's Disease) Restrictions Weight Bearing Restrictions: No  Pain: Pain Assessment Pain Assessment: No/denies pain  ADL: ADL ADL Comments: see FIM  See FIM for current functional status  Therapy/Group: Individual Therapy  Second session: Time: 1130-1200 Time Calculation (min):  30 min  Pain Assessment: No pain  Skilled Therapeutic Interventions: Therapeutic activity with focus on endurance and transfers.   Patient continued to require only supervision and setup assist for transfers: recliner <> w/c, and w/c <> NuStep.   At NuStep, patient performed 10 min continuous therex (bil U/LE), level 2, 528 steps completed.  Pt rated exertion at 11 per BORG scale without episode of "freezing" (loss of initiation) observed during activities.   See FIM for current functional status  Therapy/Group: Individual Therapy  Annita Ratliff 10/12/2013, 12:30  PM

## 2013-10-12 NOTE — Progress Notes (Signed)
Subjective/Complaints: Had a moment of rigidity in the shower yesterday. Slept well. Denies problems overnight. A 12 point review of systems has been performed and if not noted above is otherwise negative.   Objective: Vital Signs: Blood pressure 133/70, pulse 77, temperature 97.7 F (36.5 C), temperature source Oral, resp. rate 18, height 5\' 4"  (1.626 m), weight 79.8 kg (175 lb 14.8 oz), SpO2 94.00%. No results found.  Recent Labs  10/12/13 0627  WBC 7.9  HGB 8.6*  HCT 27.3*  PLT 282   No results found for this basename: NA, K, CL, CO, GLUCOSE, BUN, CREATININE, CALCIUM,  in the last 72 hours CBG (last 3)  No results found for this basename: GLUCAP,  in the last 72 hours  Wt Readings from Last 3 Encounters:  10/12/13 79.8 kg (175 lb 14.8 oz)  10/05/13 82.056 kg (180 lb 14.4 oz)  10/05/13 82.056 kg (180 lb 14.4 oz)    Physical Exam:  Constitutional: She is oriented to person, place, and time.   HENT: oral mucosa generally pink and moist, dentition poor Head: Normocephalic and atraumatic.  Eyes: Conjunctivae are normal. Pupils are equal, round, and reactive to light.  Neck: Normal range of motion. Neck supple.  Cardiovascular: Regular rhythm. No murmurs rubs or galllops Respiratory: Effort normal and breath sounds normal. No respiratory distress. She has no wheezes or rales.  GI: Soft. Bowel sounds are normal. She exhibits no distension. There is no tenderness.  Musculoskeletal: She exhibits no edema and no tenderness.  Diffuse ecchymotic area along left forearm improving Neurological: She is more alert and oriented to person, place, and time.  Ataxic speech--  Persistent, intermittent BUE and LUE tremors No rigidity. Still some dyskinesias. Fair insight and awareness. Memory good. Strength remains 4/5 UE deltoid, bicep, tricep, HI. LE 3+ HF,4- to 4 KE, 4/5 ankle dorsi and plantar flexion. No gross sensory deficits  Skin: Skin is warm and dry.  Psychiatric: She has a  normal mood and affect. Her behavior is normal. Thought content normal   Assessment/Plan: 1. Functional deficits secondary to pseudoexacerbation of parkinson's disease/ gait disorder which require 3+ hours per day of interdisciplinary therapy in a comprehensive inpatient rehab setting. Physiatrist is providing close team supervision and 24 hour management of active medical problems listed below. Physiatrist and rehab team continue to assess barriers to discharge/monitor patient progress toward functional and medical goals.   FIM: FIM - Bathing Bathing Steps Patient Completed: Chest;Right Arm;Left Arm;Abdomen;Right upper leg;Left upper leg;Right lower leg (including foot);Left lower leg (including foot) Bathing: 4: Min-Patient completes 8-9 62f 10 parts or 75+ percent  FIM - Upper Body Dressing/Undressing Upper body dressing/undressing steps patient completed: Thread/unthread right bra strap;Thread/unthread left bra strap;Hook/unhook bra;Thread/unthread right sleeve of front closure shirt/dress;Thread/unthread left sleeve of front closure shirt/dress;Pull shirt around back of front closure shirt/dress Upper body dressing/undressing: 6: More than reasonable amount of time FIM - Lower Body Dressing/Undressing Lower body dressing/undressing steps patient completed: Thread/unthread right underwear leg;Thread/unthread left underwear leg Lower body dressing/undressing: 3: Mod-Patient completed 50-74% of tasks  FIM - Toileting Toileting steps completed by patient: Adjust clothing prior to toileting Toileting Assistive Devices: Grab bar or rail for support Toileting: 4: Steadying assist  FIM - Radio producer Devices: Recruitment consultant Transfers: 0-Activity did not occur  FIM - Control and instrumentation engineer Devices: Bed rails Bed/Chair Transfer: 5: Supine > Sit: Supervision (verbal cues/safety issues);5: Bed > Chair or W/C: Supervision (verbal  cues/safety issues);4: Chair or W/C >  Bed: Min A (steadying Pt. > 75%)  FIM - Locomotion: Wheelchair Locomotion: Wheelchair: 1: Travels less than 50 ft with supervision, cueing or coaxing FIM - Locomotion: Ambulation Locomotion: Ambulation Assistive Devices: Administrator Ambulation/Gait Assistance: 4: Min assist;4: Min guard Locomotion: Ambulation: 5: Travels 150 ft or more with supervision/safety issues  Comprehension Comprehension Mode: Auditory Comprehension: 6-Follows complex conversation/direction: With extra time/assistive device  Expression Expression Mode: Verbal Expression: 4-Expresses basic 75 - 89% of the time/requires cueing 10 - 24% of the time. Needs helper to occlude trach/needs to repeat words.  Social Interaction Social Interaction: 4-Interacts appropriately 75 - 89% of the time - Needs redirection for appropriate language or to initiate interaction.  Problem Solving Problem Solving: 3-Solves basic 50 - 74% of the time/requires cueing 25 - 49% of the time  Memory Memory Mode: Not assessed Memory: 3-Recognizes or recalls 50 - 74% of the time/requires cueing 25 - 49% of the time  Medical Problem List and Plan:  Gait disorder, parkinson's disease compounded by ABLA, recent hospital course  1. DVT Prophylaxis/Anticoagulation: Mechanical: Sequential compression devices, below knee Bilateral lower extremities  2. Pain Management: prn tylenol adequate 3. Mood: has good outlook. Team to provide egosupport as needed.  4. Neuropsych: This patient is capable of making decisions on her own behalf.  5. GIB: hgb increased    Monitor for recurrent melena. Continue protonix bid.  hgb 8.6 6. Parkinson's disease: Will continue sinemet qid    -increase mirapex to 1mg  TID (was on 1.5mg  PTA) 7. Fluid overload: Continue lasix daily.  -daily I's and O's as well as weights    LOS (Days) 7 A FACE TO FACE EVALUATION WAS PERFORMED  SWARTZ,ZACHARY T 10/12/2013 8:03 AM

## 2013-10-12 NOTE — Progress Notes (Signed)
Physical Therapy Session Note  Patient Details  Name: [name] MRN: [id number] Date of Birth: 12-04-1940  Today's Date: 10/12/2013 Time: 0735-0830 Time Calculation (min): 55 min  Short Term Goals: Week 1:  PT Short Term Goal 1 (Week 1): = LTGs  Skilled Therapeutic Interventions/Progress Updates:  No c/o pain   Pt practiced gathering clothes in room from various heights for home management, supervision with rollator. Min cues for safety. Ambulation to/from gym with rollator walker supervision, head turns added for dynamic balance challenge. Cues for proximity to rollator and increasing step width. Obstacle course x 2 bouts ambulating over compliant surface, around obstacles, and over 5 steps with bil. railing min-guard for steps otherwise supervision. Practiced ambulating up/down ramp x 2 reps with rollator close supervision.   Discussed discharge planning and help at home. Discussed future planning with consideration of he expected prognosis given progressive disease.   Second Session Time:  3825-0539  Time Calculation (min): 45 min Skilled Therapeutic Interventions/Progress Updates:  7/10 low back pain, 2/10 after stretching in gym Bathroom mobility with supervision and rollator, needs encouragement to increase independence. Ambulation to/from therapy gym ~150' both directions with rollator and supervision, begins festinating gait with fatigue but responds well to PT snapping fingers for beat. Supine upper back pec stretch (with rolled towel behind spine) + chin tuck (pt had significant difficulty with this). Supine back stretch with flexed LEs + rotation side to side then knees to chest. Supine hip flexor stretch. All stretches held for 15-30 sec at a time 10-15 reps each. Standing balance on compliant surface able to tolerate 10-15 sec bouts without UE support, 2 bouts of activity.   Therapy Documentation Precautions:  Precautions Precautions: Fall Precaution Comments: reports fall   x 2 in November 14 (h/o Parkinson's Disease) Restrictions Weight Bearing Restrictions: No  See FIM for current functional status  Therapy/Group: Individual Therapy both sessions  Lahoma Rocker 10/12/2013, 12:27 PM

## 2013-10-13 ENCOUNTER — Inpatient Hospital Stay (HOSPITAL_COMMUNITY): Payer: Medicare Other | Admitting: Physical Therapy

## 2013-10-13 ENCOUNTER — Inpatient Hospital Stay (HOSPITAL_COMMUNITY): Payer: Medicare Other

## 2013-10-13 NOTE — Progress Notes (Signed)
Social Work Patient ID: [name], female   DOB: [birth year], 73 y.o.   MRN: [id number]  Met with pt yesterday to review team conference.  Pt aware and agreeable with targeted d/c date of 1/24 at mod i level. Pt aware I will arrange [facility] follow up.  Have also left a message with [facility]/ [facility] Home Aide program where pt is on a waitlist.  Attempting to check status of how soon these services may be available to her.  Pt notes brother will continue to provide daily assist as well as niece.  Continue to follow.  Millan Legan, LCSW

## 2013-10-13 NOTE — Progress Notes (Signed)
Social Work Patient ID: [name], female   DOB: 1941/09/03, 73 y.o.   MRN: [id number]  Lennart Pall, LCSW Social Worker Signed  Patient Care Conference Service date: 10/13/2013 1:59 PM  Inpatient RehabilitationTeam Conference and Plan of Care Update Date: 10/12/2013   Time: 2:25 PM     Patient Name: [name]       Medical Record Number: [id number]   Date of Birth: 1940/12/08 Sex: Female         Room/Bed: 4M09C/4M09C-01 Payor Info: Payor: MEDICARE / Plan: MEDICARE PART A AND B / Product Type: *No Product type* /   Admitting Diagnosis: GI BLEED, PARKINSONS   Admit Date/Time:  10/05/2013  4:25 PM Admission Comments: No comment available   Primary Diagnosis:  <principal problem not specified> Principal Problem: <principal problem not specified>    Patient Active Problem List     Diagnosis  Date Noted   .  Physical deconditioning  10/05/2013   .  Thrombocytopenia, unspecified  09/30/2013   .  Rectal bleeding  09/29/2013   .  UGI bleed  09/29/2013   .  Anemia due to blood loss, acute  09/29/2013   .  Anemia, iron deficiency  04/09/2013   .  Cameron lesion, acute  01/08/2012   .  Acute urinary retention, resolved after foley and no re-occurrence, 12/11/11   12/24/2011   .  Cellulitis of lower leg  12/24/2011   .  Diastolic dysfunction, grade 2  12/15/2011   .  CAP (community acquired pneumonia)   12/15/2011   .  Abnormal EKG,(TWI lead 3 on one of 7 EKGs this admission)  12/12/2011   .  Chest pain, MI R/O, negative MI- secondary to CAP 12/10/11  12/10/2011   .  Cor pulmonale  12/10/2011   .  Syncope and collapse, pt fell and did not know why she fell.  12/10/2011   .  Hematemesis  09/26/2011   .  Acute blood loss anemia  09/26/2011   .  Hypotension  09/26/2011   .  Leucocytosis  09/26/2011   .  Aspiration of blood  09/26/2011   .  Parkinson's disease  09/26/2011   .  HTN (hypertension)  09/26/2011   .  GI bleed: January 2013, Hgb stable, stool negative  09/26/2011   .  Gastric  ulcer with hemorrhage  09/26/2011   .  Esophagitis, erosive  09/26/2011     Expected Discharge Date: Expected Discharge Date: 10/16/13  Team Members Present: Physician leading conference: Dr. Alger Simons Social Worker Present: Lennart Pall, LCSW Nurse Present: Rozetta Nunnery, RN PT Present: Ladona Horns, PT OT Present: Forde Radon, Olena Leatherwood, OT PPS Coordinator present : Daiva Nakayama, RN, CRRN        Current Status/Progress  Goal  Weekly Team Focus   Medical     pseudoparkinson's exacerbation. anxiety issues.   improving functional mobility and safety  improving anxiety, decreasing dyskinesias while controlling rigidity   Bowel/Bladder     cont of bowel and bladder; urgency   remain cont of bowel and bladder  monitor, control urgency   Swallow/Nutrition/ Hydration            ADL's     Supervision - Max A with ADL, variable performance dependent on   Mod I  Transfers, functional mobility during ADL, sit<>stand, endurance, AE retraining   Mobility     Fluctuates supervision/min assist with ambulation with rollator  mod I  consistency, safety with mobility, increasing independence,  dynamic balance   Communication            Safety/Cognition/ Behavioral Observations           Pain     n/a       Skin     cellulitis BLE, pink  no skin breakdown or infection  assess skin qshift and prn    Rehab Goals Patient on target to meet rehab goals: Yes *See Care Plan and progress notes for long and short-term goals.    Barriers to Discharge:  anxiety, neuro symptoms      Possible Resolutions to Barriers:    reassurance, safety precautions, equipment, med adjustment      Discharge Planning/Teaching Needs:    home with intermittent assistance from brothers      Team Discussion:    Pt making continued goals and expect to reach mod i goals overall.  Pt will likely need to begin considering ALF soon as minimally managing @ mod i level.  She performs better in the mornings.   Anxiety can be limiting at times i.e. Closes eyes and freezes movement.  May perform better at home where she is in familiar environment.     Revisions to Treatment Plan:    None    Continued Need for Acute Rehabilitation Level of Care: The patient requires daily medical management by a physician with specialized training in physical medicine and rehabilitation for the following conditions: Daily direction of a multidisciplinary physical rehabilitation program to ensure safe treatment while eliciting the highest outcome that is of practical value to the patient.: Yes Daily medical management of patient stability for increased activity during participation in an intensive rehabilitation regime.: Yes Daily analysis of laboratory values and/or radiology reports with any subsequent need for medication adjustment of medical intervention for : Neurological problems;Other  [name] 10/13/2013, 2:00 PM

## 2013-10-13 NOTE — Patient Care Conference (Signed)
Inpatient RehabilitationTeam Conference and Plan of Care Update Date: 10/12/2013   Time: 2:25 PM    Patient Name: [name]      Medical Record Number: [id number]  Date of Birth: 1941/05/28 Sex: Female         Room/Bed: 4M09C/4M09C-01 Payor Info: Payor: MEDICARE / Plan: MEDICARE PART A AND B / Product Type: *No Product type* /    Admitting Diagnosis: GI BLEED, PARKINSONS  Admit Date/Time:  10/05/2013  4:25 PM Admission Comments: No comment available   Primary Diagnosis:  <principal problem not specified> Principal Problem: <principal problem not specified>  Patient Active Problem List   Diagnosis Date Noted  . Physical deconditioning 10/05/2013  . Thrombocytopenia, unspecified 09/30/2013  . Rectal bleeding 09/29/2013  . UGI bleed 09/29/2013  . Anemia due to blood loss, acute 09/29/2013  . Anemia, iron deficiency 04/09/2013  . Cameron lesion, acute 01/08/2012  . Acute urinary retention, resolved after foley and no re-occurrence, 12/11/11  12/24/2011  . Cellulitis of lower leg 12/24/2011  . Diastolic dysfunction, grade 2 12/15/2011  . CAP (community acquired pneumonia)  12/15/2011  . Abnormal EKG,(TWI lead 3 on one of 7 EKGs this admission) 12/12/2011  . Chest pain, MI R/O, negative MI- secondary to CAP 12/10/11 12/10/2011  . Cor pulmonale 12/10/2011  . Syncope and collapse, pt fell and did not know why she fell. 12/10/2011  . Hematemesis 09/26/2011  . Acute blood loss anemia 09/26/2011  . Hypotension 09/26/2011  . Leucocytosis 09/26/2011  . Aspiration of blood 09/26/2011  . Parkinson's disease 09/26/2011  . HTN (hypertension) 09/26/2011  . GI bleed: January 2013, Hgb stable, stool negative 09/26/2011  . Gastric ulcer with hemorrhage 09/26/2011  . Esophagitis, erosive 09/26/2011    Expected Discharge Date: Expected Discharge Date: 10/16/13  Team Members Present: Physician leading conference: Dr. Alger Simons Social Worker Present: Lennart Pall, LCSW Nurse Present: Rozetta Nunnery, RN PT Present: Ladona Horns, PT OT Present: Forde Radon, Olena Leatherwood, OT PPS Coordinator present : Daiva Nakayama, RN, CRRN     Current Status/Progress Goal Weekly Team Focus  Medical   pseudoparkinson's exacerbation. anxiety issues.   improving functional mobility and safety  improving anxiety, decreasing dyskinesias while controlling rigidity   Bowel/Bladder   cont of bowel and bladder; urgency   remain cont of bowel and bladder  monitor, control urgency   Swallow/Nutrition/ Hydration             ADL's   Supervision - Max A with ADL, variable performance dependent on   Mod I  Transfers, functional mobility during ADL, sit<>stand, endurance, AE retraining   Mobility   Fluctuates supervision/min assist with ambulation with rollator  mod I  consistency, safety with mobility, increasing independence, dynamic balance   Communication             Safety/Cognition/ Behavioral Observations            Pain   n/a         Skin   cellulitis BLE, pink  no skin breakdown or infection  assess skin qshift and prn    Rehab Goals Patient on target to meet rehab goals: Yes *See Care Plan and progress notes for long and short-term goals.  Barriers to Discharge: anxiety, neuro symptoms    Possible Resolutions to Barriers:  reassurance, safety precautions, equipment, med adjustment    Discharge Planning/Teaching Needs:  home with intermittent assistance from brothers      Team Discussion:  Pt making continued goals and  expect to reach mod i goals overall.  Pt will likely need to begin considering ALF soon as minimally managing @ mod i level.  She performs better in the mornings.  Anxiety can be limiting at times i.e. Closes eyes and freezes movement.  May perform better at home where she is in familiar environment.    Revisions to Treatment Plan:  None   Continued Need for Acute Rehabilitation Level of Care: The patient requires daily medical management by a physician  with specialized training in physical medicine and rehabilitation for the following conditions: Daily direction of a multidisciplinary physical rehabilitation program to ensure safe treatment while eliciting the highest outcome that is of practical value to the patient.: Yes Daily medical management of patient stability for increased activity during participation in an intensive rehabilitation regime.: Yes Daily analysis of laboratory values and/or radiology reports with any subsequent need for medication adjustment of medical intervention for : Neurological problems;Other  Marrio Scribner 10/13/2013, 2:00 PM

## 2013-10-13 NOTE — Progress Notes (Signed)
Physical Therapy Note  Patient Details  Name: [name] MRN: [id number] Date of Birth: 1941-06-29 Today's Date: 10/13/2013  1005 -38 (55 minutes) individual Pain: no complaint of pain Focus of treatment: gait training/activity tolerance; sit to stand focusing on bilateral quad strengthening; standing tolerance without AD Treatment: Pt in recliner upon arrival; gait to/from room to gym SBA rollator 120 feet; sit to stand from mat with/ without UE assist focusing on concentric/eccentric quad control, forward trunk lean; standing - horseshoe toss X 2 to facilitate trunk extension in stance; returned to room with all needs within reach.    1415 - 1455 (40 minutes individual Pain: no reported pain  Focus of treatment: therapeutic activities focused standing balance / ankle strategies Treatment: gait to/from room to gym SBA rollator 120 feet; standing performing reaching activities forward/ side to side on firm and soft surfaces min assist ; ball toss min assist with increased tremors bilateral LEs; returned to room with quick release belt in place and all needs within reach.   Loran Auguste,JIM 10/13/2013, 10:09 AM

## 2013-10-13 NOTE — Progress Notes (Signed)
Subjective/Complaints: Overall feeling well. Slept without issues. Rigidity better. Therapy reports occasional anxiety with tasks.. A 12 point review of systems has been performed and if not noted above is otherwise negative.   Objective: Vital Signs: Blood pressure 108/67, pulse 68, temperature 98.5 F (36.9 C), temperature source Oral, resp. rate 20, height 5\' 4"  (1.626 m), weight 79.5 kg (175 lb 4.3 oz), SpO2 93.00%. No results found.  Recent Labs  10/12/13 0627  WBC 7.9  HGB 8.6*  HCT 27.3*  PLT 282   No results found for this basename: NA, K, CL, CO, GLUCOSE, BUN, CREATININE, CALCIUM,  in the last 72 hours CBG (last 3)  No results found for this basename: GLUCAP,  in the last 72 hours  Wt Readings from Last 3 Encounters:  10/13/13 79.5 kg (175 lb 4.3 oz)  10/05/13 82.056 kg (180 lb 14.4 oz)  10/05/13 82.056 kg (180 lb 14.4 oz)    Physical Exam:  Constitutional: She is oriented to person, place, and time.   HENT: oral mucosa generally pink and moist, dentition poor Head: Normocephalic and atraumatic.  Eyes: Conjunctivae are normal. Pupils are equal, round, and reactive to light.  Neck: Normal range of motion. Neck supple.  Cardiovascular: Regular rhythm. No murmurs rubs or galllops Respiratory: Effort normal and breath sounds normal. No respiratory distress. She has no wheezes or rales.  GI: Soft. Bowel sounds are normal. She exhibits no distension. There is no tenderness.  Musculoskeletal: She exhibits no edema and no tenderness.  Diffuse ecchymotic area along left forearm resolving Neurological: She is more alert and oriented to person, place, and time.  Ataxic speech--  Persistent, intermittent BUE and LUE tremors No rigidity. Still some dyskinesias. Fair insight and awareness. Memory good. Strength remains 4/5 UE deltoid, bicep, tricep, HI. LE 4-HF, 4 KE, 4/5 ankle dorsi and plantar flexion. No gross sensory deficits  Skin: Skin is warm and dry.  Psychiatric: She  has a normal mood and affect. Her behavior is normal. Thought content normal   Assessment/Plan: 1. Functional deficits secondary to pseudoexacerbation of parkinson's disease/ gait disorder which require 3+ hours per day of interdisciplinary therapy in a comprehensive inpatient rehab setting. Physiatrist is providing close team supervision and 24 hour management of active medical problems listed below. Physiatrist and rehab team continue to assess barriers to discharge/monitor patient progress toward functional and medical goals.  Reviewed safety at home and longterm dispo plans with patient today.  FIM: FIM - Bathing Bathing Steps Patient Completed: Chest;Right Arm;Left Arm;Abdomen Bathing: 5: Set-up assist to: Obtain items  FIM - Upper Body Dressing/Undressing Upper body dressing/undressing steps patient completed: Thread/unthread right bra strap;Thread/unthread left bra strap;Hook/unhook bra;Thread/unthread right sleeve of pullover shirt/dresss;Thread/unthread left sleeve of pullover shirt/dress;Put head through opening of pull over shirt/dress;Pull shirt over trunk Upper body dressing/undressing: 5: Set-up assist to: Obtain clothing/put away FIM - Lower Body Dressing/Undressing Lower body dressing/undressing steps patient completed: Don/Doff right sock;Don/Doff left sock;Don/Doff right shoe;Don/Doff left shoe;Fasten/unfasten right shoe;Fasten/unfasten left shoe Lower body dressing/undressing: 5: Set-up assist to: Obtain clothing  FIM - Toileting Toileting steps completed by patient: Adjust clothing prior to toileting Toileting Assistive Devices: Grab bar or rail for support Toileting: 4: Steadying assist  FIM - Radio producer Devices: Bedside commode Toilet Transfers: 5-To toilet/BSC: Supervision (verbal cues/safety issues);5-From toilet/BSC: Supervision (verbal cues/safety issues)  FIM - Engineer, site Assistive Devices: Bed  rails Bed/Chair Transfer: 5: Supine > Sit: Supervision (verbal cues/safety issues);5: Bed > Chair or W/C: Supervision (  verbal cues/safety issues);4: Chair or W/C > Bed: Min A (steadying Pt. > 75%)  FIM - Locomotion: Wheelchair Locomotion: Wheelchair: 1: Travels less than 50 ft with supervision, cueing or coaxing FIM - Locomotion: Ambulation Locomotion: Ambulation Assistive Devices: Administrator Ambulation/Gait Assistance: 5: Supervision Locomotion: Ambulation: 5: Travels 150 ft or more with supervision/safety issues  Comprehension Comprehension Mode: Auditory Comprehension: 6-Follows complex conversation/direction: With extra time/assistive device  Expression Expression Mode: Verbal Expression: 4-Expresses basic 75 - 89% of the time/requires cueing 10 - 24% of the time. Needs helper to occlude trach/needs to repeat words.  Social Interaction Social Interaction: 4-Interacts appropriately 75 - 89% of the time - Needs redirection for appropriate language or to initiate interaction.  Problem Solving Problem Solving: 3-Solves basic 50 - 74% of the time/requires cueing 25 - 49% of the time  Memory Memory Mode: Not assessed Memory: 3-Recognizes or recalls 50 - 74% of the time/requires cueing 25 - 49% of the time  Medical Problem List and Plan:  Gait disorder, parkinson's disease compounded by ABLA, recent hospital course  1. DVT Prophylaxis/Anticoagulation: Mechanical: Sequential compression devices, below knee Bilateral lower extremities  2. Pain Management: prn tylenol adequate 3. Mood: has good outlook. Team to provide egosupport as needed.  4. Neuropsych: This patient is capable of making decisions on her own behalf.  5. GIB: hgb increased    Monitor for recurrent melena. Continue protonix bid.  hgb 8.6 6. Parkinson's disease: Will continue sinemet qid    -increase mirapex to 1mg  TID (was on 1.5mg  PTA) 7. Fluid overload: Continue lasix daily.  -daily I's and O's as well as  weights    LOS (Days) 8 A FACE TO FACE EVALUATION WAS PERFORMED  SWARTZ,ZACHARY T 10/13/2013 8:01 AM

## 2013-10-13 NOTE — Progress Notes (Signed)
Physical Therapy Session Note  Patient Details  Name: [name] MRN: [id number] Date of Birth: [birth year]  Today's Date: 10/13/2013 Time: 0354-6568 Time Calculation (min): 25 min  Skilled Therapeutic Interventions/Progress Updates:  1:1. Pt received sitting in recliner, ready for therapy. Focus this session on functional endurance through amb and use of NuStep. Pt demonstrating good pace during amb w/ use of rollator and (S) room<>therapy gym. NuStep, level 3x18min, w/ use of B UE/LE. Pt sitting in recliner at end of session w/ quick release belt in place and all needs w/in reach.   Therapy Documentation Precautions:  Precautions Precautions: Fall Precaution Comments: reports fall  x 2 in November 14 (h/o Parkinson's Disease) Restrictions Weight Bearing Restrictions: No  See FIM for current functional status  Therapy/Group: Individual Therapy  Gilmore Laroche 10/13/2013, 4:28 PM

## 2013-10-13 NOTE — Progress Notes (Signed)
Occupational Therapy Session Note  Patient Details  Name: [name] MRN: [id number] Date of Birth: July 11, 1941  Today's Date: 10/13/2013 Time: 1100-1200 Time Calculation (min): 60 min  Short Term Goals: Week 1:  OT Short Term Goal 1 (Week 1): STG=LTG due to short length of stay  Skilled Therapeutic Interventions: ADL-retraining with focus on static standing balance, adapted dressing using AE, general strengthening and endurance.   Patient received on toilet, awaiting RN tech assist with toileting.  Patient was encouraged to complete toileting (hygiene and clothing management) standing at [facility] which she accomplished without evidence of loss of balance fatigue.   From toilet, pt ambulated to tub bench and completed stand pivot transfer to bench but requires extra time and cues to progress due to height of equipment dissimilar to her bench at home.  Patient reports that given extra time and use of her own equipment, she will be able to complete bathing and dressing at prior level (mod I) but has requested "aides" for light homemaking tasks and standby assist for mobility.  Patient performed bathing seated at shower level but refuse to attempt to stand to wash buttocks and peri-area due to anxiety. Patient demo'd ability to use reacher this date to don underwear and pants this session.   Previously patient has demo'd ability to pull up underwear and pants while standing supported at sink but she was assisted this date due to her fearfulness when attempting to pull up pants while standing at RW.   Following dressing, patient was escorted to gym and completed 12 min of general strengthening using NuStep, level 3, (766 steps completed).       Therapy Documentation Precautions:  Precautions Precautions: Fall Precaution Comments: reports fall  x 2 in November 14 (h/o Parkinson's Disease) Restrictions Weight Bearing Restrictions: No  Pain: No pain   ADL: ADL ADL Comments: see FIM  See FIM for  current functional status  Therapy/Group: Individual Therapy  [place] 10/13/2013, 12:44 PM

## 2013-10-14 ENCOUNTER — Telehealth: Payer: Self-pay | Admitting: Gastroenterology

## 2013-10-14 ENCOUNTER — Inpatient Hospital Stay (HOSPITAL_COMMUNITY): Payer: Medicare Other | Admitting: Physical Therapy

## 2013-10-14 ENCOUNTER — Inpatient Hospital Stay (HOSPITAL_COMMUNITY): Payer: Medicare Other | Admitting: *Deleted

## 2013-10-14 ENCOUNTER — Encounter (HOSPITAL_COMMUNITY): Payer: Medicare Other | Admitting: Occupational Therapy

## 2013-10-14 NOTE — Plan of Care (Signed)
Problem: RH BOWEL ELIMINATION Goal: RH STG MANAGE BOWEL WITH ASSISTANCE STG Manage Bowel with Assistance. Mod I  Outcome: Progressing No incontinent reported

## 2013-10-14 NOTE — Progress Notes (Signed)
Physical Therapy Session Note  Patient Details  Name: QUENISHA LOVINS MRN: [id number] Date of Birth: 1941-03-09  Today's Date: 10/14/2013 Time: 14:40-15:23 (56min)   Short Term Goals: Week 1:  PT Short Term Goal 1 (Week 1): = LTGs  Skilled Therapeutic Interventions/Progress Updates:  Tx focused on functional mobility, including apartment setting, and therex for strengthening. Pt supine with c/o dizziness today - assesses orthostatic vitals with no drop in BP with position changes - see vitals flowsheet. Discussed factors that aggrivate and relieve sx, including finding focus point and ensuring adequate hydration.   Pt reported decrease in sx from 6/10 to 0/10 during tx, however tremors increased significantly throughout session, which pt reported as normal. Overall, pt quite fatigued during tx, wanting therapist to push WC. Supine<>sit and bed<>WC, WC<>toilet, and WC<>Nustep with close S and cues for safe WC set-up and Frakes use. Pt with difficulty during transition to upright trunk, and reports current chair slightly too low.  Nustep x16min with bil UE/LE at level 4 for strengthening and increased pace of movements, aiming for 60spm Transfers in apartment on carpet WC<>bed, WC<>couch again with S only and cues for safe set-up.  Pt wanting to return to bed at end of tx, left with all needs in reach. Pt feeling better overall.    Therapy Documentation Precautions:  Precautions Precautions: Fall Precaution Comments: reports fall  x 2 in November 14 (h/o Parkinson's Disease) Restrictions Weight Bearing Restrictions: No    Vital Signs: Therapy Vitals Pulse Rate: 94 BP: 113/65 mmHg Patient Position, if appropriate: Standing Pain: Pain Assessment Pain Assessment: No/denies pain    Locomotion : Ambulation Ambulation/Gait Assistance: 5: Supervision   See FIM for current functional status  Therapy/Group: Individual Therapy Kennieth Rad, PT, DPT 10/14/2013, 3:12 PM

## 2013-10-14 NOTE — Plan of Care (Signed)
Problem: RH SKIN INTEGRITY Goal: RH STG SKIN FREE OF INFECTION/BREAKDOWN Patient will not have any new skin breakdown/infection while on rehab with min assist of caregiver  Outcome: Progressing No evidence of skin breakdown

## 2013-10-14 NOTE — Progress Notes (Signed)
Physical Therapy Weekly Progress Note  Patient Details  Name: [name] MRN: [id number] Date of Birth: 1941/05/10  Today's Date: 10/14/2013 Time: 1400-1430 Time Calculation (min): 30 min  Patient has met 2 of 7 long term goals, very close to meeting her LTGs.  Short term goals not set due to estimated length of stay.  Pt currently supervision assist with ambulation and transfers with rollator walker. She is approaching modified independent however is limited during therapies at times by anxiety particularly with challenges to her balance.   Patient continues to demonstrate the following deficits: decreased standing balance (dynamic/static), impaired endurance, increased need for assistance and therefore will continue to benefit from skilled PT intervention to enhance overall performance with activity tolerance, balance and ability to compensate for deficits.  See Patient's Care Plan for progression toward long term goals.  Patient progressing toward long term goals..  Continue plan of care.  Skilled Therapeutic Interventions/Progress Updates:    Wheelchair fit trial, pt will need 20"x 18" hemi-height. Discussed door frame width pt able to contact brother who reports door frames are all >30". Practiced w/c set-up and transfers to/from bed. Supine <> sit modified independent. Discussed seeking assistance (ALF or PACE program) early once PD progresses. Pt agreeable.   Second Session Time: 7199-4129 Time Calculation (min): 30 min Skilled Therapeutic Interventions/Progress Updates:  Dizziness - "room spinning" vertigo that was not present this morning. Dizzy with position changes supine<> sit, head movements, and sit <> stands. BP supine 98/58 > sitting 105/63. Pt performed several positional changes however no nystagmus noted. Unclear if this is BPPV, Parkinson's related, or related to other medical issues.   Therapy Documentation Precautions:  Precautions Precautions: Fall Precaution  Comments: reports fall  x 2 in November 14 (h/o Parkinson's Disease) Restrictions Weight Bearing Restrictions: No Pain: Pain Assessment Pain Assessment: No/denies pain either session  See FIM for current functional status  Therapy/Group: Individual Therapy both sessions  Lahoma Rocker 10/14/2013, 3:40 PM

## 2013-10-14 NOTE — Progress Notes (Signed)
Occupational Therapy Session Note  Patient Details  Name: [name] MRN: [id number] Date of Birth: [birth year]  Today's Date: 10/14/2013 Time: 1005-1100 Time Calculation (min): 55 min  Short Term Goals: Week 1:  OT Short Term Goal 1 (Week 1): STG=LTG due to short length of stay  Skilled Therapeutic Interventions/Progress Updates:  Patient resting in bed upon arrival and reporting that she decided to lay down because she was dizzy in an earlier therapy session.  Patient engaged in self care retraining to include toileting, sponge bath, dress and groom.  Focused session on bed mobility, walker safety, and activity tolerance.  Patient able to perform all BADL tasks with set up or supervision/set up-Mod I with increased time and several rest breaks.    Therapy Documentation Precautions:  Precautions Precautions: Fall Precaution Comments: reports fall  x 2 in November 14 (h/o Parkinson's Disease) Restrictions Weight Bearing Restrictions: No Pain: No reports of pain ADL: See FIM for current functional status  Therapy/Group: Individual Therapy  Lisaann Atha 10/14/2013, 4:15 PM

## 2013-10-14 NOTE — Progress Notes (Signed)
Subjective/Complaints: No new issues. Up with therapies already. Denies pain. A 12 point review of systems has been performed and if not noted above is otherwise negative.   Objective: Vital Signs: Blood pressure 115/72, pulse 77, temperature 97.4 F (36.3 C), temperature source Oral, resp. rate 17, height 5\' 4"  (1.626 m), weight 79.4 kg (175 lb 0.7 oz), SpO2 97.00%. No results found.  Recent Labs  10/12/13 0627  WBC 7.9  HGB 8.6*  HCT 27.3*  PLT 282   No results found for this basename: NA, K, CL, CO, GLUCOSE, BUN, CREATININE, CALCIUM,  in the last 72 hours CBG (last 3)  No results found for this basename: GLUCAP,  in the last 72 hours  Wt Readings from Last 3 Encounters:  10/14/13 79.4 kg (175 lb 0.7 oz)  10/05/13 82.056 kg (180 lb 14.4 oz)  10/05/13 82.056 kg (180 lb 14.4 oz)    Physical Exam:  Constitutional: She is oriented to person, place, and time.   HENT: oral mucosa generally pink and moist, dentition poor Head: Normocephalic and atraumatic.  Eyes: Conjunctivae are normal. Pupils are equal, round, and reactive to light.  Neck: Normal range of motion. Neck supple.  Cardiovascular: Regular rhythm. No murmurs rubs or galllops Respiratory: Effort normal and breath sounds normal. No respiratory distress. She has no wheezes or rales.  GI: Soft. Bowel sounds are normal. She exhibits no distension. There is no tenderness.  Musculoskeletal: She exhibits no edema and no tenderness.  Ecchymoses resolving Neurological: She is more alert and oriented to person, place, and time.  Ataxic speech--  Persistent, intermittent BUE and LUE tremors No rigidity. Still some dyskinesias. Fair insight and awareness. Memory good. Strength remains 4/5 UE deltoid, bicep, tricep, HI. LE 4-HF, 4 KE, 4/5 ankle dorsi and plantar flexion. No gross sensory deficits  Skin: Skin is warm and dry.  Psychiatric: She has a normal mood and affect. Her behavior is normal. Thought content  normal   Assessment/Plan: 1. Functional deficits secondary to pseudoexacerbation of parkinson's disease/ gait disorder which require 3+ hours per day of interdisciplinary therapy in a comprehensive inpatient rehab setting. Physiatrist is providing close team supervision and 24 hour management of active medical problems listed below. Physiatrist and rehab team continue to assess barriers to discharge/monitor patient progress toward functional and medical goals.    FIM: FIM - Bathing Bathing Steps Patient Completed: Chest;Right Arm;Left Arm;Abdomen;Right upper leg;Left upper leg Bathing: 5: Supervision: Safety issues/verbal cues  FIM - Upper Body Dressing/Undressing Upper body dressing/undressing steps patient completed: Thread/unthread right bra strap;Thread/unthread left bra strap;Hook/unhook bra;Thread/unthread right sleeve of pullover shirt/dresss;Thread/unthread left sleeve of pullover shirt/dress;Put head through opening of pull over shirt/dress;Pull shirt over trunk Upper body dressing/undressing: 6: More than reasonable amount of time FIM - Lower Body Dressing/Undressing Lower body dressing/undressing steps patient completed: Thread/unthread right underwear leg;Thread/unthread left underwear leg;Thread/unthread right pants leg;Thread/unthread left pants leg;Don/Doff right shoe;Don/Doff left shoe Lower body dressing/undressing: 5: Set-up assist to: Obtain clothing  FIM - Toileting Toileting steps completed by patient: Adjust clothing prior to toileting;Performs perineal hygiene;Adjust clothing after toileting Toileting Assistive Devices: Grab bar or rail for support Toileting: 6: More than reasonable amount of time  FIM - Radio producer Devices: Bedside commode;Walker Toilet Transfers: 6-To toilet/ BSC;6-From toilet/BSC  FIM - Control and instrumentation engineer Devices: Copy: 5: Bed > Chair or W/C: Supervision  (verbal cues/safety issues);5: Chair or W/C > Bed: Supervision (verbal cues/safety issues)  FIM - Locomotion: Wheelchair Locomotion: Wheelchair:  1: Travels less than 50 ft with supervision, cueing or coaxing FIM - Locomotion: Ambulation Locomotion: Ambulation Assistive Devices: Walker - Rolling Ambulation/Gait Assistance: 5: Supervision Locomotion: Ambulation: 5: Travels 150 ft or more with supervision/safety issues  Comprehension Comprehension Mode: Auditory Comprehension: 6-Follows complex conversation/direction: With extra time/assistive device  Expression Expression Mode: Verbal Expression: 4-Expresses basic 75 - 89% of the time/requires cueing 10 - 24% of the time. Needs helper to occlude trach/needs to repeat words.  Social Interaction Social Interaction: 4-Interacts appropriately 75 - 89% of the time - Needs redirection for appropriate language or to initiate interaction.  Problem Solving Problem Solving: 3-Solves basic 50 - 74% of the time/requires cueing 25 - 49% of the time  Memory Memory Mode: Not assessed Memory: 3-Recognizes or recalls 50 - 74% of the time/requires cueing 25 - 49% of the time  Medical Problem List and Plan:  Gait disorder, parkinson's disease compounded by ABLA, recent hospital course  1. DVT Prophylaxis/Anticoagulation: Mechanical: Sequential compression devices, below knee Bilateral lower extremities  2. Pain Management: prn tylenol adequate 3. Mood: has good outlook. Team to provide egosupport as needed.  4. Neuropsych: This patient is capable of making decisions on her own behalf.  5. GIB: hgb increased    Monitor for recurrent melena. Continue protonix bid.  hgb 8.6 6. Parkinson's disease: Will continue sinemet qid    -increase mirapex to 1mg  TID (was on 1.5mg  PTA) 7. Fluid overload: Continue lasix daily.  -daily I's and O's as well as weights    LOS (Days) 9 A FACE TO FACE EVALUATION WAS PERFORMED  SWARTZ,ZACHARY T 10/14/2013 8:05 AM

## 2013-10-14 NOTE — Telephone Encounter (Signed)
lvm for pt to call me back regarding results.  Also mailed out a letter with results to address we have on file.

## 2013-10-15 ENCOUNTER — Inpatient Hospital Stay (HOSPITAL_COMMUNITY): Payer: Medicare Other

## 2013-10-15 ENCOUNTER — Inpatient Hospital Stay (HOSPITAL_COMMUNITY): Payer: Medicare Other | Admitting: Physical Therapy

## 2013-10-15 LAB — BASIC METABOLIC PANEL
BUN: 22 mg/dL (ref 6–23)
CO2: 27 mEq/L (ref 19–32)
CREATININE: 1.06 mg/dL (ref 0.50–1.10)
Calcium: 8.6 mg/dL (ref 8.4–10.5)
Chloride: 103 mEq/L (ref 96–112)
GFR calc non Af Amer: 51 mL/min — ABNORMAL LOW (ref >90)
GFR, EST AFRICAN AMERICAN: 59 mL/min — AB (ref >90)
GLUCOSE: 125 mg/dL — AB (ref 70–99)
POTASSIUM: 4.5 meq/L (ref 3.7–5.3)
Sodium: 141 mEq/L (ref 137–147)

## 2013-10-15 LAB — CBC
HEMATOCRIT: 29.1 % — AB (ref 36.0–46.0)
HEMOGLOBIN: 8.9 g/dL — AB (ref 12.0–15.0)
MCH: 25.2 pg — ABNORMAL LOW (ref 26.0–34.0)
MCHC: 30.6 g/dL (ref 30.0–36.0)
MCV: 82.4 fL (ref 78.0–100.0)
Platelets: 303 10*3/uL (ref 150–400)
RBC: 3.53 MIL/uL — ABNORMAL LOW (ref 3.87–5.11)
RDW: 17.9 % — AB (ref 11.5–15.5)
WBC: 8.9 10*3/uL (ref 4.0–10.5)

## 2013-10-15 MED ORDER — CARBIDOPA-LEVODOPA 25-100 MG PO TABS: 1.0000 | ORAL_TABLET | Freq: Once | ORAL | Status: DC

## 2013-10-15 MED ORDER — DSS 100 MG PO CAPS: 200.0000 mg | ORAL_CAPSULE | Freq: Two times a day (BID) | ORAL | Status: DC

## 2013-10-15 MED ORDER — PANTOPRAZOLE SODIUM 40 MG PO TBEC: 40.0000 mg | DELAYED_RELEASE_TABLET | Freq: Two times a day (BID) | ORAL | Status: DC

## 2013-10-15 MED ORDER — FUROSEMIDE 20 MG PO TABS: 40.0000 mg | ORAL_TABLET | Freq: Every day | ORAL | Status: DC

## 2013-10-15 NOTE — Discharge Instructions (Signed)
Inpatient Rehab Discharge Instructions  NATALEY BAHRI Discharge date and time: 10/15/13   Activities/Precautions/ Functional Status: Activity: activity as tolerated Diet: cardiac diet Wound Care: none needed  Functional status:  ___ No restrictions     ___ Walk up steps independently _X__ 24/7 supervision/assistance   ___ Walk up steps with assistance ___ Intermittent supervision/assistance  ___ Bathe/dress independently _X__ Walk with walker    ___ Bathe/dress with assistance ___ Walk Independently    ___ Shower independently ___ Walk with assistance    ___ Shower with assistance _X__ No alcohol     ___ Return to work/school ________     COMMUNITY REFERRALS UPON DISCHARGE:    Home Health:   PT     OT     RN    CNA    SW                  Agency:  [facility] Phone: [phone]     Medical Equipment/Items Ordered:  Wheelchair, cushion                                                         Agency/Supplier:  [facility]  Other: Mobile Me[employer] planning restart of services Monday 10/18/13       Special Instructions:    My questions have been answered and I understand these instructions. I will adhere to these goals and the provided educational materials after my discharge from the hospital.  Patient/Caregiver Signature _______________________________ Date __________  Clinician Signature _______________________________________ Date __________  Please bring this form and your medication list with you to all your follow-up doctor's appointments.

## 2013-10-15 NOTE — Progress Notes (Signed)
Subjective/Complaints: Had a good night. Up early as always A 12 point review of systems has been performed and if not noted above is otherwise negative.   Objective: Vital Signs: Blood pressure 117/61, pulse 76, temperature 97.8 F (36.6 C), temperature source Oral, resp. rate 19, height 5\' 4"  (1.626 m), weight 78 kg (171 lb 15.3 oz), SpO2 95.00%. No results found. No results found for this basename: WBC, HGB, HCT, PLT,  in the last 72 hours No results found for this basename: NA, K, CL, CO, GLUCOSE, BUN, CREATININE, CALCIUM,  in the last 72 hours CBG (last 3)  No results found for this basename: GLUCAP,  in the last 72 hours  Wt Readings from Last 3 Encounters:  10/15/13 78 kg (171 lb 15.3 oz)  10/05/13 82.056 kg (180 lb 14.4 oz)  10/05/13 82.056 kg (180 lb 14.4 oz)    Physical Exam:  Constitutional: She is oriented to person, place, and time.   HENT: oral mucosa generally pink and moist, dentition poor Head: Normocephalic and atraumatic.  Eyes: Conjunctivae are normal. Pupils are equal, round, and reactive to light.  Neck: Normal range of motion. Neck supple.  Cardiovascular: Regular rhythm. No murmurs rubs or galllops Respiratory: Effort normal and breath sounds normal. No respiratory distress. She has no wheezes or rales.  GI: Soft. Bowel sounds are normal. She exhibits no distension. There is no tenderness.  Musculoskeletal: She exhibits no edema and no tenderness.  Ecchymoses resolving Neurological: She is more alert and oriented to person, place, and time.  Ataxic speech--  Persistent, intermittent BUE and LUE tremors No rigidity. Still some dyskinesias. Fair insight and awareness. Memory good. Strength remains 4/5 UE deltoid, bicep, tricep, HI. LE 4-HF, 4 KE, 4/5 ankle dorsi and plantar flexion. No gross sensory deficits  Skin: Skin is warm and dry.  Psychiatric: She has a normal mood and affect. Her behavior is normal. Thought content normal   Assessment/Plan: 1.  Functional deficits secondary to pseudoexacerbation of parkinson's disease/ gait disorder which require 3+ hours per day of interdisciplinary therapy in a comprehensive inpatient rehab setting. Physiatrist is providing close team supervision and 24 hour management of active medical problems listed below. Physiatrist and rehab team continue to assess barriers to discharge/monitor patient progress toward functional and medical goals.    FIM: FIM - Bathing Bathing Steps Patient Completed: Chest;Right Arm;Left Arm;Abdomen;Right upper leg;Left upper leg Bathing: 5: Supervision: Safety issues/verbal cues  FIM - Upper Body Dressing/Undressing Upper body dressing/undressing steps patient completed: Thread/unthread right bra strap;Thread/unthread left bra strap;Hook/unhook bra;Thread/unthread right sleeve of pullover shirt/dresss;Thread/unthread left sleeve of pullover shirt/dress;Put head through opening of pull over shirt/dress;Pull shirt over trunk Upper body dressing/undressing: 6: More than reasonable amount of time FIM - Lower Body Dressing/Undressing Lower body dressing/undressing steps patient completed: Thread/unthread right underwear leg;Thread/unthread left underwear leg;Thread/unthread right pants leg;Thread/unthread left pants leg;Don/Doff right shoe;Don/Doff left shoe Lower body dressing/undressing: 5: Set-up assist to: Obtain clothing  FIM - Toileting Toileting steps completed by patient: Adjust clothing prior to toileting;Performs perineal hygiene;Adjust clothing after toileting Toileting Assistive Devices: Grab bar or rail for support Toileting: 6: More than reasonable amount of time  FIM - Radio producer Devices: Elevated toilet seat;Grab bars Toilet Transfers: 5-To toilet/BSC: Supervision (verbal cues/safety issues);5-From toilet/BSC: Supervision (verbal cues/safety issues)  FIM - Control and instrumentation engineer Devices: Walker;Arm  rests Bed/Chair Transfer: 5: Supine > Sit: Supervision (verbal cues/safety issues);5: Bed > Chair or W/C: Supervision (verbal cues/safety issues);5: Chair or W/C >  Bed: Supervision (verbal cues/safety issues)  FIM - Locomotion: Wheelchair Locomotion: Wheelchair: 1: Total Assistance/staff pushes wheelchair (Pt<25%) FIM - Locomotion: Ambulation Locomotion: Ambulation Assistive Devices: Administrator Ambulation/Gait Assistance: 5: Supervision Locomotion: Ambulation: 5: Travels 150 ft or more with supervision/safety issues  Comprehension Comprehension Mode: Auditory Comprehension: 6-Follows complex conversation/direction: With extra time/assistive device  Expression Expression Mode: Verbal Expression: 4-Expresses basic 75 - 89% of the time/requires cueing 10 - 24% of the time. Needs helper to occlude trach/needs to repeat words.  Social Interaction Social Interaction: 4-Interacts appropriately 75 - 89% of the time - Needs redirection for appropriate language or to initiate interaction.  Problem Solving Problem Solving: 3-Solves basic 50 - 74% of the time/requires cueing 25 - 49% of the time  Memory Memory Mode: Not assessed Memory: 3-Recognizes or recalls 50 - 74% of the time/requires cueing 25 - 49% of the time  Medical Problem List and Plan:  Gait disorder, parkinson's disease compounded by ABLA, recent hospital course  1. DVT Prophylaxis/Anticoagulation: Mechanical: Sequential compression devices, below knee Bilateral lower extremities  2. Pain Management: prn tylenol adequate 3. Mood: has good outlook. Team to provide egosupport as needed.  4. Neuropsych: This patient is capable of making decisions on her own behalf.  5. GIB: hgb increased    Monitor for recurrent melena. Continue protonix bid.  Recheck today 6. Parkinson's disease: Will continue sinemet qid    -increase mirapex to 1mg  TID (was on 1.5mg  PTA) 7. Fluid overload: Continue lasix daily.  -daily I's and O's as well  as weights -recheck lytes today before dc    LOS (Days) 10 A FACE TO FACE EVALUATION WAS PERFORMED  SWARTZ,ZACHARY T 10/15/2013 7:41 AM

## 2013-10-15 NOTE — Progress Notes (Signed)
Occupational Therapy Session Note  Patient Details  Name: [name] MRN: [id number] Date of Birth: 08-07-1941  Today's Date: 10/15/2013 Time: 1020-1100 Time Calculation (min): 40 min  Short Term Goals: Week 1:  OT Short Term Goal 1 (Week 1): STG=LTG due to short length of stay  Skilled Therapeutic Interventions: ADL-retraining with emphasis on peformance of tasks at modified independent level.  Patient demo'd ability to retrieve clothing, bathe and dress at sink side, using AE prn to extend reach.  Patient requires extra time and rest breaks during ADL but reports concern with her ability to transport pan of water to bedside.   OT re-educated patient on use of w/c or 4-wheeled walker to reduce risk of fall while transporting bath water.   Patient now reports that she will not attempt use of tub and tub bench without standby assistance for safety due to her fear of falling.  Patient claims that she has arranged to pay for [name] to perform heavy housekeeping tasks and will also include assist with transfers on/off tub bench 2x/wk.    Patient completed toileting at end of session, ambulating to bathroom with 4-wheeled walker, transferring to toilet, and completing clothing management and hygiene unassisted.    Therapy Documentation Precautions:  Precautions Precautions: Fall Precaution Comments: reports fall  x 2 in November 14 (h/o Parkinson's Disease) Restrictions Weight Bearing Restrictions: No  Pain: Pain Assessment Pain Assessment: No/denies pain  ADL: ADL ADL Comments: see FIM  See FIM for current functional status  Therapy/Group: Individual Therapy  Second session: Time: 1300-1400 Time Calculation (min):  60 min  Pain Assessment: No report of pain  Skilled Therapeutic Interventions: Therex X 15 min using NuStep, level 4.   Patient completed 932 total steps and reported fatigue after therex as moderate (14 per BORG).  ADL-retraining (45 min) with focus on light  homemaking and meal prep using LRAD.  Homemaking task:  Patient demonstrated ability to successfully load wood into her wood stove that she uses in her den (simulated in ADL apartment) using 4-wheeled.  Light meal prep: Although claiming to consume meals-on-wheels meals routinely, patient reports that she stores ready-made meals for emergencies in her freezer and she would use her wood stove to heat food if experiencing loss of power due to ice/snow storm.   Patient demo'd ability to retrieve an item form freezer and transport to microwave or tabletop using seat of 4-wheeled walker, unassisted.  OT educated patient on need for de-icer (rock salt) on ramp during icy or snowy conditions.   Patient maintains that she uses [place] for community mobility, successfully, and will continue as needed for appointments and errands.  See FIM for current functional status  Therapy/Group: Individual Therapy  [place] 10/15/2013, 3:34 PM

## 2013-10-15 NOTE — Discharge Summary (Signed)
Physician Discharge Summary  Patient ID: [name] MRN: [id number] DOB/AGE: Mar 29, 1941 73 y.o.  Admit date: 10/05/2013 Discharge date: 10/16/2013  Discharge Diagnoses:  Active Problems:   Physical deconditioning   Discharged Condition: Stable  Significant Diagnostic Studies: No results found.  Labs:  Basic Metabolic Panel:  Recent Labs Lab 10/15/13 0800  NA 141  K 4.5  CL 103  CO2 27  GLUCOSE 125*  BUN 22  CREATININE 1.06  CALCIUM 8.6    CBC:  Recent Labs Lab 10/15/13 0800  WBC 8.9  HGB 8.9*  HCT 29.1*  MCV 82.4  PLT 303    CBG: No results found for this basename: GLUCAP,  in the last 168 hours  Brief HPI:   [name] is a 73 y.o. female with history of Parkinson's disease, HTN, CHF; who was admitted from PCP office on 09/29/13 with reports of BRBPR, dizziness and hgb 5.8. Patient with prior hx of GIB and stomach ulcers in 2011. She was transfused with 3 units PRBC and made NPO per Dr. Ardis Hughs input. EGD done on 09/30/13 revealing medium to large HH, ulcer treated with Epi and coagulation as well as pan gastritis. Melena resolved and patient was tolerating regular diet. H/H has been stable. She has required  IV lasix intermittent for fluid overload issues. CIR was recommended by rehab team   Hospital Course: [name] was admitted to rehab 10/05/2013 for inpatient therapies to consist of PT, ST and OT at least three hours five days a week. Past admission physiatrist, therapy team and rehab RN have worked together to provide customized collaborative inpatient rehab. Blood pressures have been controlled and she was monitored for signs of overload. Weights is down to 78 kg and respiratory status has been stable. She continues to have some dyskinesias as well as persistent, intermittent BUE tremors. She continues on her home dose of sinemet and mirapex and is to follow up with neurology for adjustment as needed. Her endurance has improved and she's independent at  discharge. She will continue to receive home therapies past discharge.    Rehab course: During patient's stay in rehab weekly team conferences were held to monitor patient's progress, set goals and discuss barriers to discharge. Patient has had improvement in activity tolerance, balance, postural control, as well as ability to compensate for deficits.  She is modified independent for ambulating >200 feet with rollator. She requires supervision to navigate stairs and ramp. Hematochezia has resolved and H/H is slowly improving.   Disposition: 01-Home or Self Care  Diet: Cardiac diet.   Special Instructions: 1. [facility] to provide PT, OT, RN, CNA, SW       Future Appointments Provider Department Dept Phone   11/01/2013 1:15 PM [place] [place] [phone]   11/03/2013 11:00 AM Jerene Bears, MD [facility] [phone]   11/24/2013 1:30 PM Dennie Bible, NP [facility] [phone]       Medication List         albuterol (2.5 MG/3ML) 0.083% nebulizer solution  Commonly known as:  PROVENTIL  Take 2.5 mg by nebulization every 6 (six) hours as needed. For shortness of breath     b complex vitamins tablet  Take 1 tablet by mouth daily.     carbidopa-levodopa 25-100 MG per tablet  Commonly known as:  SINEMET IR  Take 1.5 tablets by mouth 4 (four) times daily. 6am, 10am, 2pm and 6pm     carbidopa-levodopa  50-200 MG per tablet  Commonly known as:  SINEMET CR  Take 1 tablet by mouth at bedtime. 10pm     DSS 100 MG Caps  Take 200 mg by mouth 2 (two) times daily.     furosemide 20 MG tablet  Commonly known as:  LASIX  Take 2 tablets (40 mg total) by mouth daily. Sometimes she takes once daily, sometimes twice daily     magnesium oxide 400 MG tablet  Commonly known as:  MAG-OX  Take 400 mg by mouth daily.     pantoprazole 40 MG tablet  Commonly known as:  PROTONIX  Take 1 tablet (40 mg total) by mouth 2  (two) times daily.     pramipexole 1.5 MG tablet  Commonly known as:  MIRAPEX  Take 1 tablet (1.5 mg total) by mouth 3 (three) times daily.     triamcinolone cream 0.1 %  Commonly known as:  KENALOG  Apply 1 application topically daily. Applied to legs       Follow-up Information   Follow up with [name], MD. Call today.   Specialty:  Physical Medicine and Rehabilitation   Contact information:   [address], [place] [address] [phone]       Follow up with REED, [name], DO On 11/01/2013. (@ 1:15 pm)    Specialty:  Geriatric Medicine   Contact information:   [place] [place] [address] [phone]       Signed: Bary Leriche 10/20/2013, 6:09 PM

## 2013-10-15 NOTE — Progress Notes (Signed)
Occupational Therapy Discharge Summary  Patient Details  Name: ONEDIA VARGUS MRN: [id number] Date of Birth: 26-Jan-1941  Today's Date: 10/15/2013  Patient has met 6 of 7 long term goals due to improved activity tolerance, improved balance and ability to compensate for deficits.  Patient to discharge at overall Modified Independent level.      Reasons goals not met: Patient has elected to not attempt tub bench transfers without assistance with plan to pay her niece for intermittent assistance out-of-pocket.  Recommendation:  Patient will benefit from ongoing skilled OT services in home health setting to continue to advance functional skills to reduce care burden on intermittent caregiver.  Equipment: No equipment provided  Reasons for discharge: treatment goals met  Patient/family agrees with progress made and goals achieved: Yes  OT Discharge Precautions/Restrictions  Precautions Precautions: Fall Restrictions Weight Bearing Restrictions: No  Pain Pain Assessment Pain Assessment: No/denies pain  ADL ADL ADL Comments: see FIM  Vision/Perception  Vision - History Baseline Vision: Wears glasses only for reading Patient Visual Report: No change from baseline Vision - Assessment Eye Alignment: Within Functional Limits Perception Perception: Within Functional Limits Praxis Praxis: Intact   Cognition Overall Cognitive Status: Within Functional Limits for tasks assessed Orientation Level: Oriented X4 Attention: Alternating Sustained Attention: Appears intact Alternating Attention: Appears intact Memory: Appears intact Awareness: Appears intact Problem Solving: Appears intact Executive Function: Self Correcting Safety/Judgment: Appears intact  Sensation Sensation Light Touch: Appears Intact Stereognosis: Appears Intact Hot/Cold: Appears Intact Proprioception: Appears Intact Coordination Gross Motor Movements are Fluid and Coordinated: Yes Fine Motor Movements  are Fluid and Coordinated: Yes (for UE during ADL with extra time)  Motor  Motor Motor: Within Functional Limits Motor - Skilled Clinical Observations:  Parkinson's tremors  Mobility  Bed Mobility Bed Mobility: Supine to Sit;Sit to Supine;Rolling Right Rolling Right: 6: Modified independent (Device/Increase time) Supine to Sit: 6: Modified independent (Device/Increase time) Sit to Supine: 6: Modified independent (Device/Increase time) Transfers Transfers: Sit to Stand;Stand to Sit Sit to Stand: 6: Modified independent (Device/Increase time) Stand to Sit: 6: Modified independent (Device/Increase time)   Trunk/Postural Assessment  Cervical Assessment Cervical Assessment: Exceptions to [facility] (foward head) Thoracic Assessment Thoracic Assessment: Exceptions to [facility] (flexed posture) Lumbar Assessment Lumbar Assessment: Exceptions to [facility] (posterior pelvic tilt) Postural Control Postural Control: Within Functional Limits   Balance Balance Balance Assessed: Yes Static Sitting Balance Static Sitting - Level of Assistance: 6: Modified independent (Device/Increase time) Dynamic Sitting Balance Dynamic Sitting - Level of Assistance: 6: Modified independent (Device/Increase time) Static Standing Balance Static Standing - Level of Assistance: 6: Modified independent (Device/Increase time) Static Standing - Comment/# of Minutes: 60 in tandem Static Stance: Eyes Closed: 15 sec  Dynamic Standing Balance Dynamic Standing - Level of Assistance: 6: Modified independent (Device/Increase time)  Extremity/Trunk Assessment RUE Assessment RUE Assessment: Within Functional Limits LUE Assessment LUE Assessment: Within Functional Limits  See FIM for current functional status  Jaeleen Inzunza 10/15/2013, 3:57 PM

## 2013-10-15 NOTE — Progress Notes (Signed)
Physical Therapy Discharge Summary  Patient Details  Name: [name] MRN: [id number] Date of Birth: 1940/11/21  Today's Date: 10/15/2013 Time: 0730-0830 Time Calculation (min): 60 min No c/o pain Ambulation in room to demonstrate ability to safely mobilize with rollator - bathroom mobility, transfers to bed and recliner at modified independent level with rollator. Ambulation controlled environment >200' with modified independence with rollator. Ramp with supervision and rollator. 13 steps with bil. Railing modified independent. Ambulation in home environment + sit <> stands from low couch x 2 reps modified independent level with rollator. Supine HEP: chin tucks, hooklying lumbar rotation, knees to chest. Pt made Modified independent in room.   Second Session Time:  1130-1200 Time Calculation (min): 30 min Skilled Therapeutic Interventions/Progress Updates:  No c/o pain  Pt reports she only had one instance of difficulty during mod I time in room - reports problems with a narrow space in room and navigation of rollator. Practiced narrow space again and facilitated problem solving. Pt able to come up with 2 solutions and able to carry them out mod I. Ambulation to/from gym mod I with rollator. NuStep Level 4 x 8 min per pt request. Pt reports no concerns or questions with return home. W/c not delivered yet, PT will come later today to assess fit.   Patient has met 8 of 8 long term goals due to improved activity tolerance, improved balance, increased strength, ability to compensate for deficits and improved coordination.  Patient to discharge at an ambulatory level Modified Independent.     Reasons goals not met: NA  Recommendation:  Patient will benefit from ongoing skilled PT services in home health setting to continue to advance safe functional mobility, address ongoing impairments in dynamic balance, deficits related to progressive nature of Parkinson's Disease, endurance, safety in the  home, and minimize fall risk.  Equipment: wheelchair  Reasons for discharge: treatment goals met and discharge from hospital  Patient/family agrees with progress made and goals achieved: Yes  PT Discharge Precautions/Restrictions Precautions Precautions: Fall Restrictions Weight Bearing Restrictions: No Pain Pain Assessment Pain Assessment: No/denies pain  Cognition Overall Cognitive Status: Within Functional Limits for tasks assessed Arousal/Alertness: Awake/alert Orientation Level: Oriented X4 Sensation Sensation Light Touch: Appears Intact Proprioception: Appears Intact Coordination Gross Motor Movements are Fluid and Coordinated: Yes Motor  Motor Motor - Skilled Clinical Observations:  Parkinson's tremors  Mobility Bed Mobility Bed Mobility: Supine to Sit;Sit to Supine Supine to Sit: 6: Modified independent (Device/Increase time);HOB flat Sit to Supine: 6: Modified independent (Device/Increase time);HOB flat Transfers Transfers: Yes Sit to Stand: 6: Modified independent (Device/Increase time) Stand to Sit: 6: Modified independent (Device/Increase time) Stand Pivot Transfers: 6: Modified independent (Device/Increase time) Locomotion  Ambulation Ambulation: Yes Ambulation/Gait Assistance: 6: Modified independent (Device/Increase time) Assistive device: Rolling walker Gait Gait: Yes Gait Pattern: Impaired Gait Pattern: Step-through pattern;Trunk flexed;Narrow base of support;Decreased trunk rotation High Level Ambulation High Level Ambulation: Backwards walking;Head turns Backwards Walking: modified independent (pt does not like to do) Head Turns: mod I Stairs / Additional Locomotion Stairs: Yes Stairs Assistance: 6: Modified independent (Device/Increase time) Stair Management Technique: Two rails;Alternating pattern;Forwards Number of Stairs: 13 Ramp: 6: Modified independent Water quality scientist) Architect: Yes Wheelchair Assistance:  6: Modified independent (Device/Increase time) Environmental health practitioner: Both upper extremities Wheelchair Parts Management: Independent  Trunk/Postural Assessment  Cervical Assessment Cervical Assessment: Exceptions to [facility] (foward head) Thoracic Assessment Thoracic Assessment: Exceptions to [facility] (flexed posture) Lumbar Assessment Lumbar Assessment: Exceptions to [facility] (posterior pelvic tilt) Postural Control  Postural Control: Within Functional Limits  Balance Balance Balance Assessed: Yes Static Sitting Balance Static Sitting - Level of Assistance: 6: Modified independent (Device/Increase time) Dynamic Sitting Balance Dynamic Sitting - Level of Assistance: 6: Modified independent (Device/Increase time) Static Standing Balance Static Standing - Level of Assistance: 6: Modified independent (Device/Increase time) Static Standing - Comment/# of Minutes: 60 in tandem Static Stance: Eyes Closed: 15 sec  Dynamic Standing Balance Dynamic Standing - Level of Assistance: 6: Modified independent (Device/Increase time) Extremity Assessment      RLE Assessment RLE Assessment:  (grossly 4/5; decreased muscular endurance however improved from evaluation) LLE Assessment LLE Assessment:  (grossly 4/5; decreased muscular endurance however improved from evaluatin)  See FIM for current functional status  Lahoma Rocker 10/15/2013, 12:13 PM

## 2013-10-16 NOTE — Plan of Care (Signed)
Problem: RH BOWEL ELIMINATION Goal: RH STG MANAGE BOWEL W/MEDICATION W/ASSISTANCE STG Manage Bowel with Medication with [place].  Outcome: Progressing Sen[place] tab given 1/23 at 2128, pending results

## 2013-10-16 NOTE — Progress Notes (Signed)
Subjective/Complaints: No complaints. Feeling well. Excited to go home today! A 12 point review of systems has been performed and if not noted above is otherwise negative.   Objective: Vital Signs: Blood pressure 122/66, pulse 84, temperature 97.6 F (36.4 C), temperature source Oral, resp. rate 19, height 5\' 4"  (1.626 m), weight 78 kg (171 lb 15.3 oz), SpO2 96.00%. No results found.  Recent Labs  10/15/13 0800  WBC 8.9  HGB 8.9*  HCT 29.1*  PLT 303    Recent Labs  10/15/13 0800  NA 141  K 4.5  CL 103  GLUCOSE 125*  BUN 22  CREATININE 1.06  CALCIUM 8.6   CBG (last 3)  No results found for this basename: GLUCAP,  in the last 72 hours  Wt Readings from Last 3 Encounters:  10/15/13 78 kg (171 lb 15.3 oz)  10/05/13 82.056 kg (180 lb 14.4 oz)  10/05/13 82.056 kg (180 lb 14.4 oz)    Physical Exam:  Constitutional: She is oriented to person, place, and time.   HENT: oral mucosa generally pink and moist, dentition poor Head: Normocephalic and atraumatic.  Eyes: Conjunctivae are normal. Pupils are equal, round, and reactive to light.  Neck: Normal range of motion. Neck supple.  Cardiovascular: Regular rhythm. No murmurs rubs or galllops Respiratory: Effort normal and breath sounds normal. No respiratory distress. She has no wheezes or rales.  GI: Soft. Bowel sounds are normal. She exhibits no distension. There is no tenderness.  Musculoskeletal: She exhibits no edema and no tenderness.  Ecchymoses resolving Neurological: She is more alert and oriented to person, place, and time.  Ataxic speech--  Persistent, intermittent BUE and LUE tremors No rigidity. Still some dyskinesias. Fair insight and awareness. Memory good. Strength remains 4/5 UE deltoid, bicep, tricep, HI. LE 4-HF, 4 KE, 4/5 ankle dorsi and plantar flexion. No gross sensory deficits  Skin: Skin is warm and dry.  Psychiatric: She has a normal mood and affect. Her behavior is normal. Thought content  normal   Assessment/Plan: 1. Functional deficits secondary to pseudoexacerbation of parkinson's disease/ gait disorder which require 3+ hours per day of interdisciplinary therapy in a comprehensive inpatient rehab setting. Physiatrist is providing close team supervision and 24 hour management of active medical problems listed below. Physiatrist and rehab team continue to assess barriers to discharge/monitor patient progress toward functional and medical goals.   Dc home with HH follow up  FIM: FIM - Bathing Bathing Steps Patient Completed: Chest;Right Arm;Left Arm;Abdomen;Front perineal area;Buttocks;Right upper leg;Left upper leg;Right lower leg (including foot);Left lower leg (including foot) Bathing: 6: More than reasonable amount of time (using LH sponge, reacher, sitting/standing at sink)  FIM - Upper Body Dressing/Undressing Upper body dressing/undressing steps patient completed: Thread/unthread right bra strap;Thread/unthread left bra strap;Hook/unhook bra;Thread/unthread right sleeve of front closure shirt/dress;Thread/unthread left sleeve of front closure shirt/dress;Pull shirt around back of front closure shirt/dress;Button/unbutton shirt (with button pre-buttoned) Upper body dressing/undressing: 6: More than reasonable amount of time FIM - Lower Body Dressing/Undressing Lower body dressing/undressing steps patient completed: Thread/unthread right underwear leg;Thread/unthread left underwear leg;Pull underwear up/down;Thread/unthread right pants leg;Thread/unthread left pants leg;Pull pants up/down;Fasten/unfasten pants;Don/Doff right sock;Don/Doff left sock;Don/Doff right shoe;Don/Doff left shoe;Fasten/unfasten right shoe;Fasten/unfasten left shoe (using elastic shoe laces, reacher, sock aid) Lower body dressing/undressing: 6: More than reasonable amount of time  FIM - Toileting Toileting steps completed by patient: Adjust clothing prior to toileting;Performs perineal  hygiene;Adjust clothing after toileting Toileting Assistive Devices: Grab bar or rail for support Toileting: 6: Assistive device: No  helper  FIM - Radio producer Devices: Elevated toilet seat;Grab bars Toilet Transfers: 6-Assistive device: No helper  FIM - Control and instrumentation engineer Devices: Walker;Arm rests Bed/Chair Transfer: 6: Assistive device: no helper  FIM - Locomotion: Wheelchair Locomotion: Wheelchair: 6: Travels 150 ft or more, turns around, maneuvers to table, bed or toilet, negotiates 3% grade: maneuvers on rugs and over door sills independently FIM - Locomotion: Ambulation Locomotion: Ambulation Assistive Devices: Administrator Ambulation/Gait Assistance: 6: Modified independent (Device/Increase time) Locomotion: Ambulation: 6: Travels 150 ft or more with assistive device/no helper  Comprehension Comprehension Mode: Auditory Comprehension: 6-Follows complex conversation/direction: With extra time/assistive device  Expression Expression Mode: Verbal Expression: 5-Expresses complex 90% of the time/cues < 10% of the time  Social Interaction Social Interaction: 5-Interacts appropriately 90% of the time - Needs monitoring or encouragement for participation or interaction.  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 DVT Prophylaxis/Anticoagulation: Mechanical: Sequential compression devices, below knee Bilateral lower extremities  2. Pain Management: prn tylenol adequate 3. Mood: has good outlook. Team to provide egosupport as needed.  4. Neuropsych: This patient is capable of making decisions on her own behalf.  5. GIB:     Monitor for recurrent melena. Continue protonix bid.  hgb  8.9, increasing  -will need outpt follow up 6. Parkinson's disease: Will continue sinemet qid    -increase mirapex to 1mg  TID (was on 1.5mg  PTA) 7. Fluid overload: Continue lasix daily.  -daily I's and O's as well as weights -lytes, renal function ok   LOS (Days) 11 A FACE TO FACE EVALUATION WAS PERFORMED  [name] T 10/16/2013 8:25 AM

## 2013-10-16 NOTE — Progress Notes (Signed)
Addendum: Discharge instructions provided by Pam Love, PA 

## 2013-10-16 NOTE — Progress Notes (Signed)
Pt discharged home with family. Discharge instructions provided previously by Silvestre Mesi, PA. Pt verbalized understanding. Pt escorted off unit in w/c with personal belonging by Levada Dy, [place].

## 2013-10-18 DIAGNOSIS — G20 Parkinson's disease: Secondary | ICD-10-CM | POA: Diagnosis not present

## 2013-10-18 DIAGNOSIS — I5022 Chronic systolic (congestive) heart failure: Secondary | ICD-10-CM | POA: Diagnosis not present

## 2013-10-18 DIAGNOSIS — I509 Heart failure, unspecified: Secondary | ICD-10-CM | POA: Diagnosis not present

## 2013-10-18 DIAGNOSIS — K449 Diaphragmatic hernia without obstruction or gangrene: Secondary | ICD-10-CM | POA: Diagnosis not present

## 2013-10-18 DIAGNOSIS — Z602 Problems related to living alone: Secondary | ICD-10-CM | POA: Diagnosis not present

## 2013-10-18 DIAGNOSIS — R269 Unspecified abnormalities of gait and mobility: Secondary | ICD-10-CM | POA: Diagnosis not present

## 2013-10-18 DIAGNOSIS — R5381 Other malaise: Secondary | ICD-10-CM | POA: Diagnosis not present

## 2013-10-18 DIAGNOSIS — I10 Essential (primary) hypertension: Secondary | ICD-10-CM | POA: Diagnosis not present

## 2013-10-18 DIAGNOSIS — R5383 Other fatigue: Secondary | ICD-10-CM | POA: Diagnosis not present

## 2013-10-18 NOTE — Progress Notes (Signed)
Social Work  Discharge Note  The overall goal for the admission was met for:   Discharge location: Yes - home with intermittent support of brother and sister-in-law  Length of Stay: Yes - 11 days  Discharge activity level: Yes - modified independent  Home/community participation: Yes  Services provided included: MD, RD, PT, OT, RN, TR, Pharmacy and [place]: Medicare and Private Insurance: [place]  Follow-up services arranged: Home Health: Therapist, sports, PT, OT, SW, CNA via [place], DME: 20x16 super hemi, lightweight w/c with back and basic seat cushion via [place], Other: Re-start of pt's mobile meals and Patient/Family has no preference for HH/DME agencies  Comments (or additional information):  Patient/Family verbalized understanding of follow-up arrangements: Yes  Individual responsible for coordination of the follow-up plan: patient  Confirmed correct DME delivered: [name] 10/18/2013    [name]

## 2013-10-19 ENCOUNTER — Telehealth: Payer: Self-pay | Admitting: *Deleted

## 2013-10-19 DIAGNOSIS — I509 Heart failure, unspecified: Secondary | ICD-10-CM | POA: Diagnosis not present

## 2013-10-19 DIAGNOSIS — G20 Parkinson's disease: Secondary | ICD-10-CM | POA: Diagnosis not present

## 2013-10-19 DIAGNOSIS — I10 Essential (primary) hypertension: Secondary | ICD-10-CM | POA: Diagnosis not present

## 2013-10-19 DIAGNOSIS — K449 Diaphragmatic hernia without obstruction or gangrene: Secondary | ICD-10-CM | POA: Diagnosis not present

## 2013-10-19 DIAGNOSIS — R269 Unspecified abnormalities of gait and mobility: Secondary | ICD-10-CM | POA: Diagnosis not present

## 2013-10-19 DIAGNOSIS — I5022 Chronic systolic (congestive) heart failure: Secondary | ICD-10-CM | POA: Diagnosis not present

## 2013-10-19 NOTE — Telephone Encounter (Signed)
Pt called for a f/u appt with Dr Hilarie Fredrickson post hospitalization.pt scheduled for 11/03/13 as f/u; pt stated understanding. Calendar mailed.

## 2013-10-20 DIAGNOSIS — I509 Heart failure, unspecified: Secondary | ICD-10-CM | POA: Diagnosis not present

## 2013-10-20 DIAGNOSIS — G20 Parkinson's disease: Secondary | ICD-10-CM | POA: Diagnosis not present

## 2013-10-20 DIAGNOSIS — K449 Diaphragmatic hernia without obstruction or gangrene: Secondary | ICD-10-CM | POA: Diagnosis not present

## 2013-10-20 DIAGNOSIS — I10 Essential (primary) hypertension: Secondary | ICD-10-CM | POA: Diagnosis not present

## 2013-10-20 DIAGNOSIS — I5022 Chronic systolic (congestive) heart failure: Secondary | ICD-10-CM | POA: Diagnosis not present

## 2013-10-20 DIAGNOSIS — R269 Unspecified abnormalities of gait and mobility: Secondary | ICD-10-CM | POA: Diagnosis not present

## 2013-10-21 DIAGNOSIS — K449 Diaphragmatic hernia without obstruction or gangrene: Secondary | ICD-10-CM | POA: Diagnosis not present

## 2013-10-21 DIAGNOSIS — I10 Essential (primary) hypertension: Secondary | ICD-10-CM | POA: Diagnosis not present

## 2013-10-21 DIAGNOSIS — I509 Heart failure, unspecified: Secondary | ICD-10-CM | POA: Diagnosis not present

## 2013-10-21 DIAGNOSIS — I5022 Chronic systolic (congestive) heart failure: Secondary | ICD-10-CM | POA: Diagnosis not present

## 2013-10-21 DIAGNOSIS — G20 Parkinson's disease: Secondary | ICD-10-CM | POA: Diagnosis not present

## 2013-10-21 DIAGNOSIS — R269 Unspecified abnormalities of gait and mobility: Secondary | ICD-10-CM | POA: Diagnosis not present

## 2013-10-22 DIAGNOSIS — I5022 Chronic systolic (congestive) heart failure: Secondary | ICD-10-CM | POA: Diagnosis not present

## 2013-10-22 DIAGNOSIS — K449 Diaphragmatic hernia without obstruction or gangrene: Secondary | ICD-10-CM | POA: Diagnosis not present

## 2013-10-22 DIAGNOSIS — R269 Unspecified abnormalities of gait and mobility: Secondary | ICD-10-CM | POA: Diagnosis not present

## 2013-10-22 DIAGNOSIS — I509 Heart failure, unspecified: Secondary | ICD-10-CM | POA: Diagnosis not present

## 2013-10-22 DIAGNOSIS — I10 Essential (primary) hypertension: Secondary | ICD-10-CM | POA: Diagnosis not present

## 2013-10-22 DIAGNOSIS — G20 Parkinson's disease: Secondary | ICD-10-CM | POA: Diagnosis not present

## 2013-10-25 DIAGNOSIS — G20 Parkinson's disease: Secondary | ICD-10-CM | POA: Diagnosis not present

## 2013-10-25 DIAGNOSIS — I10 Essential (primary) hypertension: Secondary | ICD-10-CM | POA: Diagnosis not present

## 2013-10-25 DIAGNOSIS — K449 Diaphragmatic hernia without obstruction or gangrene: Secondary | ICD-10-CM | POA: Diagnosis not present

## 2013-10-25 DIAGNOSIS — R269 Unspecified abnormalities of gait and mobility: Secondary | ICD-10-CM | POA: Diagnosis not present

## 2013-10-25 DIAGNOSIS — I509 Heart failure, unspecified: Secondary | ICD-10-CM | POA: Diagnosis not present

## 2013-10-25 DIAGNOSIS — I5022 Chronic systolic (congestive) heart failure: Secondary | ICD-10-CM | POA: Diagnosis not present

## 2013-10-26 DIAGNOSIS — I10 Essential (primary) hypertension: Secondary | ICD-10-CM | POA: Diagnosis not present

## 2013-10-26 DIAGNOSIS — I5022 Chronic systolic (congestive) heart failure: Secondary | ICD-10-CM | POA: Diagnosis not present

## 2013-10-26 DIAGNOSIS — I509 Heart failure, unspecified: Secondary | ICD-10-CM | POA: Diagnosis not present

## 2013-10-26 DIAGNOSIS — G20 Parkinson's disease: Secondary | ICD-10-CM | POA: Diagnosis not present

## 2013-10-26 DIAGNOSIS — K449 Diaphragmatic hernia without obstruction or gangrene: Secondary | ICD-10-CM | POA: Diagnosis not present

## 2013-10-26 DIAGNOSIS — R269 Unspecified abnormalities of gait and mobility: Secondary | ICD-10-CM | POA: Diagnosis not present

## 2013-10-27 DIAGNOSIS — R269 Unspecified abnormalities of gait and mobility: Secondary | ICD-10-CM | POA: Diagnosis not present

## 2013-10-27 DIAGNOSIS — I10 Essential (primary) hypertension: Secondary | ICD-10-CM | POA: Diagnosis not present

## 2013-10-27 DIAGNOSIS — I509 Heart failure, unspecified: Secondary | ICD-10-CM | POA: Diagnosis not present

## 2013-10-27 DIAGNOSIS — G20 Parkinson's disease: Secondary | ICD-10-CM | POA: Diagnosis not present

## 2013-10-27 DIAGNOSIS — K449 Diaphragmatic hernia without obstruction or gangrene: Secondary | ICD-10-CM | POA: Diagnosis not present

## 2013-10-27 DIAGNOSIS — I5022 Chronic systolic (congestive) heart failure: Secondary | ICD-10-CM | POA: Diagnosis not present

## 2013-10-28 DIAGNOSIS — I10 Essential (primary) hypertension: Secondary | ICD-10-CM | POA: Diagnosis not present

## 2013-10-28 DIAGNOSIS — K449 Diaphragmatic hernia without obstruction or gangrene: Secondary | ICD-10-CM | POA: Diagnosis not present

## 2013-10-28 DIAGNOSIS — G20 Parkinson's disease: Secondary | ICD-10-CM | POA: Diagnosis not present

## 2013-10-28 DIAGNOSIS — I509 Heart failure, unspecified: Secondary | ICD-10-CM | POA: Diagnosis not present

## 2013-10-28 DIAGNOSIS — I5022 Chronic systolic (congestive) heart failure: Secondary | ICD-10-CM | POA: Diagnosis not present

## 2013-10-28 DIAGNOSIS — R269 Unspecified abnormalities of gait and mobility: Secondary | ICD-10-CM | POA: Diagnosis not present

## 2013-10-29 DIAGNOSIS — I5022 Chronic systolic (congestive) heart failure: Secondary | ICD-10-CM | POA: Diagnosis not present

## 2013-10-29 DIAGNOSIS — R269 Unspecified abnormalities of gait and mobility: Secondary | ICD-10-CM | POA: Diagnosis not present

## 2013-10-29 DIAGNOSIS — I509 Heart failure, unspecified: Secondary | ICD-10-CM | POA: Diagnosis not present

## 2013-10-29 DIAGNOSIS — I10 Essential (primary) hypertension: Secondary | ICD-10-CM | POA: Diagnosis not present

## 2013-10-29 DIAGNOSIS — G20 Parkinson's disease: Secondary | ICD-10-CM | POA: Diagnosis not present

## 2013-10-29 DIAGNOSIS — K449 Diaphragmatic hernia without obstruction or gangrene: Secondary | ICD-10-CM | POA: Diagnosis not present

## 2013-11-01 ENCOUNTER — Ambulatory Visit (INDEPENDENT_AMBULATORY_CARE_PROVIDER_SITE_OTHER): Payer: Medicare Other | Admitting: Internal Medicine

## 2013-11-01 ENCOUNTER — Encounter: Payer: Self-pay | Admitting: Internal Medicine

## 2013-11-01 VITALS — BP 130/74 | HR 80 | Temp 98.0°F | Resp 10 | Wt 184.0 lb

## 2013-11-01 DIAGNOSIS — K5900 Constipation, unspecified: Secondary | ICD-10-CM

## 2013-11-01 DIAGNOSIS — D509 Iron deficiency anemia, unspecified: Secondary | ICD-10-CM | POA: Diagnosis not present

## 2013-11-01 DIAGNOSIS — K254 Chronic or unspecified gastric ulcer with hemorrhage: Secondary | ICD-10-CM | POA: Diagnosis not present

## 2013-11-01 DIAGNOSIS — G20 Parkinson's disease: Secondary | ICD-10-CM | POA: Diagnosis not present

## 2013-11-01 DIAGNOSIS — I509 Heart failure, unspecified: Secondary | ICD-10-CM | POA: Diagnosis not present

## 2013-11-01 DIAGNOSIS — I5042 Chronic combined systolic (congestive) and diastolic (congestive) heart failure: Secondary | ICD-10-CM

## 2013-11-01 DIAGNOSIS — R531 Weakness: Secondary | ICD-10-CM

## 2013-11-01 DIAGNOSIS — I5022 Chronic systolic (congestive) heart failure: Secondary | ICD-10-CM | POA: Diagnosis not present

## 2013-11-01 DIAGNOSIS — R42 Dizziness and giddiness: Secondary | ICD-10-CM

## 2013-11-01 DIAGNOSIS — G20A1 Parkinson's disease without dyskinesia, without mention of fluctuations: Secondary | ICD-10-CM

## 2013-11-01 DIAGNOSIS — K449 Diaphragmatic hernia without obstruction or gangrene: Secondary | ICD-10-CM | POA: Diagnosis not present

## 2013-11-01 DIAGNOSIS — R5383 Other fatigue: Secondary | ICD-10-CM

## 2013-11-01 DIAGNOSIS — R269 Unspecified abnormalities of gait and mobility: Secondary | ICD-10-CM | POA: Diagnosis not present

## 2013-11-01 DIAGNOSIS — R5381 Other malaise: Secondary | ICD-10-CM

## 2013-11-01 DIAGNOSIS — K5909 Other constipation: Secondary | ICD-10-CM

## 2013-11-01 DIAGNOSIS — I10 Essential (primary) hypertension: Secondary | ICD-10-CM | POA: Diagnosis not present

## 2013-11-01 MED ORDER — LINACLOTIDE 145 MCG PO CAPS: 145.0000 ug | ORAL_CAPSULE | Freq: Every day | ORAL | Status: DC

## 2013-11-01 NOTE — Progress Notes (Signed)
Patient ID: [name], female   DOB: 01/24/1941, 73 y.o.   MRN: [id number]   Location:  [facility]  Code Status: DNR as per discussion here in the office and [place] completed several months ago  Allergies  Allergen Reactions  . Codeine Other (See Comments)    unknown  . Penicillins Other (See Comments)    unknown    Chief Complaint  Patient presents with  . Medical Managment of Chronic Issues    3 month follow-up   . Hospitalization Follow-up    Hospital follow-up on Gastric Ulcer   . Medication Management    Discuss side effects to Pramipexole 1.5 mg tab     HPI: Patient is a 73 y.o. white female seen in the office today for hospital f/u on gastric ulcer with GI bleed.  Still feels weak.  No more bleeding.  Breathing still short sometimes when she tries to do something.  Pramipexole has side effects that sound similar to her symptoms of dizziness, weakness, nausea.  Sometimes has to back up and that makes her dizzy.  She is falling asleep easily.  Is doing PT, OT at home.    Review of Systems:  Review of Systems  Constitutional: Positive for malaise/fatigue. Negative for fever, chills and weight loss.       Has gained weight  Eyes: Negative for blurred vision.       Wears glasses  Respiratory: Negative for shortness of breath.   Cardiovascular: Negative for chest pain.  Gastrointestinal: Positive for heartburn, abdominal pain and constipation. Negative for blood in stool and melena.  Genitourinary: Negative for dysuria.  Musculoskeletal: Negative for falls and myalgias.  Neurological: Positive for dizziness, tremors and weakness. Negative for loss of consciousness and headaches.       Parkinson's disease  Psychiatric/Behavioral: Positive for depression. Negative for memory loss.       Today    Past Medical History  Diagnosis Date  . Parkinson's disease   . Hypertension   . Arthritis   . Anemia   . Cellulitis of lower  leg 12/24/2011  . Acute bronchitis   . Other abnormal blood chemistry   . Chronic systolic heart failure   . Acute on chronic systolic heart failure   . Cellulitis and abscess of leg, except foot   . Hypotension, unspecified   . Helicobacter pylori (H. pylori)   . Acute gastric ulcer with hemorrhage, without mention of obstruction     Past Surgical History  Procedure Laterality Date  . Hip arthroscopy w/ labral repair    . Thyroidectomy    . Esophagogastroduodenoscopy  09/26/2011    Procedure: ESOPHAGOGASTRODUODENOSCOPY (EGD);  Surgeon: Zenovia Jarred, MD;  Location: [facility] ENDOSCOPY;  Service: Gastroenterology;  Laterality: N/A;  To be done at bedside.  . Esophagogastroduodenoscopy N/A 09/30/2013    Procedure: ESOPHAGOGASTRODUODENOSCOPY (EGD);  Surgeon: [name], MD;  Location: [place];  Service: Endoscopy;  Laterality: N/A;    Social History:   reports that she has never smoked. She has never used smokeless tobacco. She reports that she does not drink alcohol or use illicit drugs.  Family History  Problem Relation Age of Onset  . GER disease Mother   . Heart disease Father     Medications: Patient's Medications  New Prescriptions   No medications on file  Previous Medications   ALBUTEROL (PROVENTIL) (2.5 MG/3ML) 0.083% NEBULIZER SOLUTION    Take 2.5 mg by nebulization every 6 (six)  hours as needed. For shortness of breath   B COMPLEX VITAMINS TABLET    Take 1 tablet by mouth daily.     CARBIDOPA-LEVODOPA (SINEMET CR) 50-200 MG PER TABLET    Take 1 tablet by mouth at bedtime. 10pm   CARBIDOPA-LEVODOPA (SINEMET IR) 25-100 MG PER TABLET    Take 1.5 tablets by mouth 4 (four) times daily. 6am, 10am, 2pm and 6pm   DOCUSATE SODIUM 100 MG CAPS    Take 200 mg by mouth 2 (two) times daily.   FUROSEMIDE (LASIX) 20 MG TABLET    Take 2 tablets (40 mg total) by mouth daily. Sometimes she takes once daily, sometimes twice daily   MAGNESIUM OXIDE (MAG-OX) 400 MG TABLET    Take 400 mg by  mouth daily.    PANTOPRAZOLE (PROTONIX) 40 MG TABLET    Take 1 tablet (40 mg total) by mouth 2 (two) times daily.   PRAMIPEXOLE (MIRAPEX) 1.5 MG TABLET    Take 1 tablet (1.5 mg total) by mouth 3 (three) times daily.   TRIAMCINOLONE CREAM (KENALOG) 0.1 %    Apply 1 application topically daily. Applied to legs  Modified Medications   No medications on file  Discontinued Medications   No medications on file     Physical Exam: Filed Vitals:   11/01/13 1230  BP: 130/74  Pulse: 80  Temp: 98 F (36.7 C)  TempSrc: Oral  Resp: 10  Weight: 184 lb (83.462 kg)  SpO2: 95%  Physical Exam  Constitutional: She is oriented to person, place, and time. She appears well-developed and well-nourished. No distress.  HENT:  Head: Normocephalic and atraumatic.  Cardiovascular: Normal rate and regular rhythm.   Murmur heard. Pulmonary/Chest: Effort normal and breath sounds normal. No respiratory distress. She has no rales.  Abdominal: Soft. Bowel sounds are normal. She exhibits no distension and no mass. There is no tenderness.  Musculoskeletal: Normal range of motion. She exhibits no tenderness.  2+ pitting edema above top of ted hose  Neurological: She is alert and oriented to person, place, and time.  Skin: Skin is warm and dry. There is pallor.  Psychiatric:  Affect flat today--usually Happier   Labs reviewed: Basic Metabolic Panel:  Recent Labs  07/30/13 1004 08/02/13 1500  10/02/13 0422 10/06/13 0606 10/15/13 0800  NA 137  --   < > 139 136* 141  K 4.6  --   < > 4.0 4.6 4.5  CL 104  --   < > 103 100 103  CO2 25  --   < > 29 29 27   GLUCOSE 97  --   < > 109* 95 125*  BUN 13  --   < > 13 18 22   CREATININE 0.63  --   < > 0.80 0.85 1.06  CALCIUM 8.9  --   < > 8.2* 8.7 8.6  TSH  --  1.700  --   --   --   --   < > = values in this interval not displayed. Liver Function Tests:  Recent Labs  07/30/13 1004 09/29/13 0923 09/30/13 0655 10/06/13 0606  AST 21 13 12 12   ALT <5 5 <5 <5    ALKPHOS 82 52 45 62  BILITOT 0.5 0.3 0.7 0.5  PROT 6.3 4.9* 4.4* 5.5*  ALBUMIN 3.1*  --  2.3* 2.5*    Recent Labs  04/09/13  LIPASE 18  AMYLASE 28*  CBC:  Recent Labs  09/29/13 0923 09/29/13 1443  10/06/13 0606 10/12/13  7517 10/15/13 0800  WBC 13.0* 12.2*  < > 8.9 7.9 8.9  NEUTROABS 9.5* 10.2*  --  6.2  --   --   HGB 5.8* 5.5*  < > 8.7* 8.6* 8.9*  HCT 18.4* 17.2*  < > 27.2* 27.3* 29.1*  MCV 78* 78.2  < > 83.4 82.5 82.4  PLT  --  142*  < > 185 282 303  < > = values in this interval not displayed.  Assessment/Plan 1. Gastric ulcer with hemorrhage - will f/u labs to see where anemia stands at this point as it is probably the reason for her other symptoms at this time - CBC with Differential - Basic metabolic panel  2. Anemia, iron deficiency -f/u cbc, is not on iron at this point--may need to restart, but does have severe constipation problems, as well  3. Parkinson's disease -cont current meds and keep f/u with [facility]  4. Chronic combined systolic and diastolic congestive heart failure -has gained weight, but is following cardiac prudent diet and using compression hose, elevating feet -does not have clinical evidence of chf aside from the weight gain (may be due to increased help at home--has nephew's wife coming in, brother visiting nightly with supper, meals on wheels, pt, ot)  5. Dizziness -continue PT, OT at home, suspect this is residual from her anemia from the gi bleeding  6. Generalized weakness -again likely anemia related--cont home health therapy and using rollator  7. Chronic constipation -has failed typical routines of stool softeners, miralax multiple doses a day, even suppositories - Linaclotide (LINZESS) 145 MCG CAPS capsule; Take 1 capsule (145 mcg total) by mouth daily.  Dispense: 30 capsule; Refill: 3  Labs/tests ordered:   Orders Placed This Encounter  Procedures  . CBC with Differential  . Basic metabolic panel   Next appt:   3 mos, and prn.

## 2013-11-02 ENCOUNTER — Encounter: Payer: Self-pay | Admitting: Internal Medicine

## 2013-11-02 DIAGNOSIS — I10 Essential (primary) hypertension: Secondary | ICD-10-CM | POA: Diagnosis not present

## 2013-11-02 DIAGNOSIS — K449 Diaphragmatic hernia without obstruction or gangrene: Secondary | ICD-10-CM | POA: Diagnosis not present

## 2013-11-02 DIAGNOSIS — R269 Unspecified abnormalities of gait and mobility: Secondary | ICD-10-CM | POA: Diagnosis not present

## 2013-11-02 DIAGNOSIS — G20 Parkinson's disease: Secondary | ICD-10-CM | POA: Diagnosis not present

## 2013-11-02 DIAGNOSIS — I509 Heart failure, unspecified: Secondary | ICD-10-CM | POA: Diagnosis not present

## 2013-11-02 DIAGNOSIS — I5022 Chronic systolic (congestive) heart failure: Secondary | ICD-10-CM | POA: Diagnosis not present

## 2013-11-02 LAB — CBC WITH DIFFERENTIAL/PLATELET
Basophils Absolute: 0.1 10*3/uL (ref 0.0–0.2)
Basos: 1 %
Eos: 4 %
Eosinophils Absolute: 0.3 10*3/uL (ref 0.0–0.4)
HCT: 26 % — ABNORMAL LOW (ref 34.0–46.6)
Hemoglobin: 7.8 g/dL — ABNORMAL LOW (ref 11.1–15.9)
Immature Grans (Abs): 0 10*3/uL (ref 0.0–0.1)
Immature Granulocytes: 0 %
Lymphocytes Absolute: 1 10*3/uL (ref 0.7–3.1)
Lymphs: 14 %
MCH: 23.4 pg — ABNORMAL LOW (ref 26.6–33.0)
MCHC: 30 g/dL — ABNORMAL LOW (ref 31.5–35.7)
MCV: 78 fL — ABNORMAL LOW (ref 79–97)
Monocytes Absolute: 0.7 10*3/uL (ref 0.1–0.9)
Monocytes: 10 %
Neutrophils Absolute: 5.1 10*3/uL (ref 1.4–7.0)
Neutrophils Relative %: 71 %
RBC: 3.34 x10E6/uL — ABNORMAL LOW (ref 3.77–5.28)
RDW: 17.9 % — ABNORMAL HIGH (ref 12.3–15.4)
WBC: 7.2 10*3/uL (ref 3.4–10.8)

## 2013-11-02 LAB — BASIC METABOLIC PANEL
BUN/Creatinine Ratio: 21 (ref 11–26)
BUN: 16 mg/dL (ref 8–27)
CO2: 24 mmol/L (ref 18–29)
Calcium: 8.8 mg/dL (ref 8.7–10.3)
Chloride: 100 mmol/L (ref 97–108)
Creatinine, Ser: 0.76 mg/dL (ref 0.57–1.00)
GFR calc Af Amer: 90 mL/min/{1.73_m2} (ref >59)
GFR calc non Af Amer: 78 mL/min/{1.73_m2} (ref >59)
Glucose: 89 mg/dL (ref 65–99)
Potassium: 4 mmol/L (ref 3.5–5.2)
Sodium: 140 mmol/L (ref 134–144)

## 2013-11-03 ENCOUNTER — Encounter: Payer: Self-pay | Admitting: Internal Medicine

## 2013-11-03 ENCOUNTER — Ambulatory Visit (INDEPENDENT_AMBULATORY_CARE_PROVIDER_SITE_OTHER): Payer: Medicare Other | Admitting: Internal Medicine

## 2013-11-03 VITALS — BP 120/88 | HR 64 | Ht 64.0 in | Wt 181.2 lb

## 2013-11-03 DIAGNOSIS — R269 Unspecified abnormalities of gait and mobility: Secondary | ICD-10-CM | POA: Diagnosis not present

## 2013-11-03 DIAGNOSIS — K449 Diaphragmatic hernia without obstruction or gangrene: Secondary | ICD-10-CM | POA: Diagnosis not present

## 2013-11-03 DIAGNOSIS — K259 Gastric ulcer, unspecified as acute or chronic, without hemorrhage or perforation: Secondary | ICD-10-CM

## 2013-11-03 DIAGNOSIS — I509 Heart failure, unspecified: Secondary | ICD-10-CM | POA: Diagnosis not present

## 2013-11-03 DIAGNOSIS — D509 Iron deficiency anemia, unspecified: Secondary | ICD-10-CM

## 2013-11-03 DIAGNOSIS — I10 Essential (primary) hypertension: Secondary | ICD-10-CM | POA: Diagnosis not present

## 2013-11-03 DIAGNOSIS — G20 Parkinson's disease: Secondary | ICD-10-CM | POA: Diagnosis not present

## 2013-11-03 DIAGNOSIS — I5022 Chronic systolic (congestive) heart failure: Secondary | ICD-10-CM | POA: Diagnosis not present

## 2013-11-03 MED ORDER — PANTOPRAZOLE SODIUM 40 MG PO TBEC
40.0000 mg | DELAYED_RELEASE_TABLET | Freq: Two times a day (BID) | ORAL | Status: DC
Start: 2013-11-03 — End: 2013-12-28

## 2013-11-03 MED ORDER — SUCRALFATE 1 G PO TABS: 1.0000 g | ORAL_TABLET | Freq: Three times a day (TID) | ORAL | Status: DC

## 2013-11-03 MED ORDER — INTEGRA PLUS PO CAPS: 1.0000 | ORAL_CAPSULE | Freq: Every day | ORAL | Status: DC

## 2013-11-03 NOTE — Patient Instructions (Signed)
We have sent the following medications to your pharmacy for you to pick up at your convenience: take protonix twicec a day, carafate and integra as prescribed.   Follow up with Dr. Hilarie Fredrickson in early April

## 2013-11-03 NOTE — Progress Notes (Signed)
Subjective:    Patient ID: [name], female    DOB: [birth year], 73 y.o.   MRN: [id number]  HPI [name] is a 73 year old female with a past mental history of gastric ulcers, Parkinson's disease, hypertension, CHF, H. pylori infection status post treatment who is seen in hospital followup. Ms. [name] was hospitalized in 2013 with bleeding gastric ulcer which was treated and then documented to have healed several months later he was readmitted with an upper GI bleed in January 2015. Dr. Ardis Hughs performed an upper endoscopy which showed gastric ulceration with visible vessel. This was treated with epinephrine injection and BiCAP cautery.  Biopsies on that day were negative for H. pylori and malignancy. She was subsequently discharged from the hospital on twice daily PPI.  She returns for hospital followup. She is still having some mild epigastric discomfort with eating and occasional nausea. No vomiting. No further black or tarry stools. No rectal bleeding. She was given a prescription for Linzess but has not filled this but plans to pick up soon. She has noted constipation which is somewhat chronic for her. She denies NSAIDs.  Review of Systems As per history of present illness, otherwise negative  Current Medications, Allergies, Past Medical History, Past Surgical History, Family History and Social History were reviewed in [employer] record.     Objective:   Physical Exam BP 120/88  Pulse 64  Ht 5\' 4"  (1.626 m)  Wt 181 lb 3.2 oz (82.192 kg)  BMI 31.09 kg/m2 Constitutional: Chronically ill-appearing female in no acute distress HEENT: Normocephalic and atraumatic. No scleral icterus. Neck: Neck supple. Trachea midline. Cardiovascular: Normal rate, regular rhythm and intact distal pulses.  Pulmonary/chest: Effort normal and breath sounds normal. No wheezing, rales or rhonchi. Abdominal: Soft, mild epigastric tenderness, nondistended. Bowel sounds active throughout.   Extremities: no clubbing, cyanosis, or edema Neurological: Alert and oriented to person place and time. Resting tremor Skin: Skin is warm and dry. No rashes noted. Psychiatric: Normal mood and affect. Behavior is normal.  CBC    Component Value Date/Time   WBC 7.2 11/01/2013 1339   WBC 8.9 10/15/2013 0800   RBC 3.34* 11/01/2013 1339   RBC 3.53* 10/15/2013 0800   HGB 7.8* 11/01/2013 1339   HCT 26.0* 11/01/2013 1339   PLT 303 10/15/2013 0800   MCV 78* 11/01/2013 1339   MCH 23.4* 11/01/2013 1339   MCH 25.2* 10/15/2013 0800   MCHC 30.0* 11/01/2013 1339   MCHC 30.6 10/15/2013 0800   RDW 17.9* 11/01/2013 1339   RDW 17.9* 10/15/2013 0800   LYMPHSABS 1.0 11/01/2013 1339   LYMPHSABS 1.2 10/06/2013 0606   MONOABS 0.7 10/06/2013 0606   EOSABS 0.3 11/01/2013 1339   EOSABS 0.6 10/06/2013 0606   BASOSABS 0.1 11/01/2013 1339   BASOSABS 0.1 10/06/2013 0606       Assessment & Plan:   73 year old female with a past mental history of gastric ulcers, Parkinson's disease, hypertension, CHF, H. pylori infection status post treatment who is seen in hospital followup. Ms. [name] was hospitalized in 2013 with bleeding gastric ulcer which was treated and then documented to have healed several months later he was readmitted with an upper GI bleed in January 2015.  1.  Gastric ulcer -- initially she had H. pylori which was treated and recent biopsies were negative for H. pylori indicative of eradication.  I have recommended continue twice-daily pantoprazole 40 mg daily. She should completely avoid NSAIDs. I will add Carafate 1 g 3 times  a day before meals and at bedtime. I will also give her prescription for Zofran to be used as needed and as directed for nausea. Given that this is recurrent ulcer disease, I would like to see her back in early April 2015 to discuss recurrent endoscopy to again document healing  2.  Microcytic anemia -- anemia is still significant with hemoglobin of 7.8 2 days ago. I recommended Integra 1 capsule daily  for iron replacement. I also asked her to notify me immediately or her primary care provider, or the ER for any black or tarry stools.  She voiced understanding

## 2013-11-04 DIAGNOSIS — G20 Parkinson's disease: Secondary | ICD-10-CM | POA: Diagnosis not present

## 2013-11-04 DIAGNOSIS — I5022 Chronic systolic (congestive) heart failure: Secondary | ICD-10-CM | POA: Diagnosis not present

## 2013-11-04 DIAGNOSIS — I10 Essential (primary) hypertension: Secondary | ICD-10-CM | POA: Diagnosis not present

## 2013-11-04 DIAGNOSIS — K449 Diaphragmatic hernia without obstruction or gangrene: Secondary | ICD-10-CM | POA: Diagnosis not present

## 2013-11-04 DIAGNOSIS — R269 Unspecified abnormalities of gait and mobility: Secondary | ICD-10-CM | POA: Diagnosis not present

## 2013-11-04 DIAGNOSIS — I509 Heart failure, unspecified: Secondary | ICD-10-CM | POA: Diagnosis not present

## 2013-11-05 DIAGNOSIS — I509 Heart failure, unspecified: Secondary | ICD-10-CM | POA: Diagnosis not present

## 2013-11-05 DIAGNOSIS — R269 Unspecified abnormalities of gait and mobility: Secondary | ICD-10-CM | POA: Diagnosis not present

## 2013-11-05 DIAGNOSIS — K449 Diaphragmatic hernia without obstruction or gangrene: Secondary | ICD-10-CM | POA: Diagnosis not present

## 2013-11-05 DIAGNOSIS — I5022 Chronic systolic (congestive) heart failure: Secondary | ICD-10-CM | POA: Diagnosis not present

## 2013-11-05 DIAGNOSIS — I10 Essential (primary) hypertension: Secondary | ICD-10-CM | POA: Diagnosis not present

## 2013-11-05 DIAGNOSIS — G20 Parkinson's disease: Secondary | ICD-10-CM | POA: Diagnosis not present

## 2013-11-08 DIAGNOSIS — I10 Essential (primary) hypertension: Secondary | ICD-10-CM | POA: Diagnosis not present

## 2013-11-08 DIAGNOSIS — R269 Unspecified abnormalities of gait and mobility: Secondary | ICD-10-CM | POA: Diagnosis not present

## 2013-11-08 DIAGNOSIS — K449 Diaphragmatic hernia without obstruction or gangrene: Secondary | ICD-10-CM | POA: Diagnosis not present

## 2013-11-08 DIAGNOSIS — I509 Heart failure, unspecified: Secondary | ICD-10-CM | POA: Diagnosis not present

## 2013-11-08 DIAGNOSIS — G20 Parkinson's disease: Secondary | ICD-10-CM | POA: Diagnosis not present

## 2013-11-08 DIAGNOSIS — I5022 Chronic systolic (congestive) heart failure: Secondary | ICD-10-CM | POA: Diagnosis not present

## 2013-11-09 ENCOUNTER — Encounter (HOSPITAL_COMMUNITY): Payer: Self-pay | Admitting: Emergency Medicine

## 2013-11-09 ENCOUNTER — Emergency Department (HOSPITAL_COMMUNITY)
Admission: EM | Admit: 2013-11-09 | Discharge: 2013-11-09 | Disposition: A | Discharge status: Home / Self Care | Payer: Medicare Other | Attending: Emergency Medicine | Admitting: Emergency Medicine

## 2013-11-09 DIAGNOSIS — Z880 Allergy status to penicillin: Secondary | ICD-10-CM | POA: Insufficient documentation

## 2013-11-09 DIAGNOSIS — R259 Unspecified abnormal involuntary movements: Secondary | ICD-10-CM | POA: Insufficient documentation

## 2013-11-09 DIAGNOSIS — Z8619 Personal history of other infectious and parasitic diseases: Secondary | ICD-10-CM | POA: Diagnosis not present

## 2013-11-09 DIAGNOSIS — I10 Essential (primary) hypertension: Secondary | ICD-10-CM | POA: Diagnosis not present

## 2013-11-09 DIAGNOSIS — G20A1 Parkinson's disease without dyskinesia, without mention of fluctuations: Secondary | ICD-10-CM | POA: Insufficient documentation

## 2013-11-09 DIAGNOSIS — G20 Parkinson's disease: Secondary | ICD-10-CM | POA: Diagnosis not present

## 2013-11-09 DIAGNOSIS — D649 Anemia, unspecified: Secondary | ICD-10-CM | POA: Insufficient documentation

## 2013-11-09 DIAGNOSIS — R195 Other fecal abnormalities: Secondary | ICD-10-CM

## 2013-11-09 DIAGNOSIS — Z8739 Personal history of other diseases of the musculoskeletal system and connective tissue: Secondary | ICD-10-CM | POA: Diagnosis not present

## 2013-11-09 DIAGNOSIS — Z79899 Other long term (current) drug therapy: Secondary | ICD-10-CM | POA: Diagnosis not present

## 2013-11-09 DIAGNOSIS — K921 Melena: Secondary | ICD-10-CM | POA: Insufficient documentation

## 2013-11-09 DIAGNOSIS — J441 Chronic obstructive pulmonary disease with (acute) exacerbation: Secondary | ICD-10-CM | POA: Diagnosis not present

## 2013-11-09 DIAGNOSIS — Z872 Personal history of diseases of the skin and subcutaneous tissue: Secondary | ICD-10-CM | POA: Insufficient documentation

## 2013-11-09 DIAGNOSIS — I5023 Acute on chronic systolic (congestive) heart failure: Secondary | ICD-10-CM | POA: Insufficient documentation

## 2013-11-09 DIAGNOSIS — R062 Wheezing: Secondary | ICD-10-CM | POA: Diagnosis not present

## 2013-11-09 LAB — CBC WITH DIFFERENTIAL/PLATELET
BASOS ABS: 0.1 10*3/uL (ref 0.0–0.1)
Basophils Relative: 1 % (ref 0–1)
Eosinophils Absolute: 0.3 10*3/uL (ref 0.0–0.7)
Eosinophils Relative: 5 % (ref 0–5)
HCT: 27.6 % — ABNORMAL LOW (ref 36.0–46.0)
Hemoglobin: 8.3 g/dL — ABNORMAL LOW (ref 12.0–15.0)
LYMPHS PCT: 23 % (ref 12–46)
Lymphs Abs: 1.4 10*3/uL (ref 0.7–4.0)
MCH: 23.1 pg — ABNORMAL LOW (ref 26.0–34.0)
MCHC: 30.1 g/dL (ref 30.0–36.0)
MCV: 76.7 fL — ABNORMAL LOW (ref 78.0–100.0)
Monocytes Absolute: 0.5 10*3/uL (ref 0.1–1.0)
Monocytes Relative: 8 % (ref 3–12)
Neutro Abs: 4 10*3/uL (ref 1.7–7.7)
Neutrophils Relative %: 64 % (ref 43–77)
PLATELETS: 213 10*3/uL (ref 150–400)
RBC: 3.6 MIL/uL — ABNORMAL LOW (ref 3.87–5.11)
RDW: 18.8 % — AB (ref 11.5–15.5)
WBC: 6.2 10*3/uL (ref 4.0–10.5)

## 2013-11-09 LAB — COMPREHENSIVE METABOLIC PANEL
AST: 17 U/L (ref 0–37)
Albumin: 3.5 g/dL (ref 3.5–5.2)
Alkaline Phosphatase: 89 U/L (ref 39–117)
BILIRUBIN TOTAL: 0.3 mg/dL (ref 0.3–1.2)
BUN: 15 mg/dL (ref 6–23)
CHLORIDE: 104 meq/L (ref 96–112)
CO2: 26 meq/L (ref 19–32)
Calcium: 9 mg/dL (ref 8.4–10.5)
Creatinine, Ser: 0.69 mg/dL (ref 0.50–1.10)
GFR calc Af Amer: >90 mL/min (ref >90)
GFR, EST NON AFRICAN AMERICAN: 84 mL/min — AB (ref >90)
Glucose, Bld: 85 mg/dL (ref 70–99)
Potassium: 4 mEq/L (ref 3.7–5.3)
SODIUM: 143 meq/L (ref 137–147)
Total Protein: 6.8 g/dL (ref 6.0–8.3)

## 2013-11-09 LAB — LIPASE, BLOOD: Lipase: 20 U/L (ref 11–59)

## 2013-11-09 LAB — OCCULT BLOOD, POC DEVICE: Fecal Occult Bld: POSITIVE — AB

## 2013-11-09 MED ORDER — SODIUM CHLORIDE 0.9 % IV SOLN
80.0000 mg | Freq: Once | INTRAVENOUS | Status: AC
  Administered 2013-11-09: 80 mg via INTRAVENOUS
  Filled 2013-11-09: qty 80

## 2013-11-09 MED ORDER — SODIUM CHLORIDE 0.9 % IV BOLUS (SEPSIS)
500.0000 mL | Freq: Once | INTRAVENOUS | Status: AC
  Administered 2013-11-09: 500 mL via INTRAVENOUS

## 2013-11-09 MED ORDER — IPRATROPIUM BROMIDE 0.02 % IN SOLN
0.5000 mg | Freq: Once | RESPIRATORY_TRACT | Status: AC
  Administered 2013-11-09: 0.5 mg via RESPIRATORY_TRACT
  Filled 2013-11-09: qty 2.5

## 2013-11-09 MED ORDER — ALBUTEROL SULFATE (2.5 MG/3ML) 0.083% IN NEBU
5.0000 mg | INHALATION_SOLUTION | Freq: Once | RESPIRATORY_TRACT | Status: AC
  Administered 2013-11-09: 5 mg via RESPIRATORY_TRACT
  Filled 2013-11-09: qty 6

## 2013-11-09 MED ORDER — ONDANSETRON HCL 4 MG/2ML IJ SOLN
4.0000 mg | Freq: Once | INTRAMUSCULAR | Status: AC
  Administered 2013-11-09: 4 mg via INTRAVENOUS
  Filled 2013-11-09: qty 2

## 2013-11-09 NOTE — Discharge Instructions (Signed)
Please make sure to follow up with your primary care doctor to have your hemoglobin checked in 1 week. Read the information below caredully for reasons to return to the emergency department. Gastrointestinal Bleeding Gastrointestinal (GI) bleeding means there is bleeding somewhere along the digestive tract, between the mouth and anus. CAUSES  There are many different problems that can cause GI bleeding. Possible causes include:  Esophagitis. This is inflammation, irritation, or swelling of the esophagus.  Hemorrhoids.These are veins that are full of blood (engorged) in the rectum. They cause pain, inflammation, and may bleed.  Anal fissures.These are areas of painful tearing which may bleed. They are often caused by passing hard stool.  Diverticulosis.These are pouches that form on the colon over time, with age, and may bleed significantly.  Diverticulitis.This is inflammation in areas with diverticulosis. It can cause pain, fever, and bloody stools, although bleeding is rare.  Polyps and cancer. Colon cancer often starts out as precancerous polyps.  Gastritis and ulcers.Bleeding from the upper gastrointestinal tract (near the stomach) may travel through the intestines and produce black, sometimes tarry, often bad smelling stools. In certain cases, if the bleeding is fast enough, the stools may not be black, but red. This condition may be life-threatening. SYMPTOMS   Vomiting bright red blood or material that looks like coffee grounds.  Bloody, black, or tarry stools. DIAGNOSIS  Your caregiver may diagnose your condition by taking your history and performing a physical exam. More tests may be needed, including:  X-rays and other imaging tests.  Esophagogastroduodenoscopy (EGD). This test uses a flexible, lighted tube to look at your esophagus, stomach, and small intestine.  Colonoscopy. This test uses a flexible, lighted tube to look at your colon. TREATMENT  Treatment depends  on the cause of your bleeding.   For bleeding from the esophagus, stomach, small intestine, or colon, the caregiver doing your EGD or colonoscopy may be able to stop the bleeding as part of the procedure.  Inflammation or infection of the colon can be treated with medicines.  Many rectal problems can be treated with creams, suppositories, or warm baths.  Surgery is sometimes needed.  Blood transfusions are sometimes needed if you have lost a lot of blood. If bleeding is slow, you may be allowed to go home. If there is a lot of bleeding, you will need to stay in the hospital for observation. HOME CARE INSTRUCTIONS   Take any medicines exactly as prescribed.  Keep your stools soft by eating foods that are high in fiber. These foods include whole grains, legumes, fruits, and vegetables. Prunes (1 to 3 a day) work well for many people.  Drink enough fluids to keep your urine clear or pale yellow. SEEK IMMEDIATE MEDICAL CARE IF:   Your bleeding increases.  You feel lightheaded, weak, or you faint.  You have severe cramps in your back or abdomen.  You pass large blood clots in your stool.  Your problems are getting worse. MAKE SURE YOU:   Understand these instructions.  Will watch your condition.  Will get help right away if you are not doing well or get worse. Document Released: 09/06/2000 Document Revised: 08/26/2012 Document Reviewed: 08/19/2011 [facility] Patient Information 2014 [place], [place].

## 2013-11-09 NOTE — ED Provider Notes (Signed)
CSN: [id number]     Arrival date & time 11/09/13  1214 History   First MD Initiated Contact with Patient 11/09/13 1215     No chief complaint on file.    (Consider location/radiation/quality/duration/timing/severity/associated sxs/prior Treatment) HPI  [name] is a 73 year old female with a past medical history of Parkinson's disease, chronic cellulitis of the c, erosive esophagitis with a GI bleed and anemia requiring admission in this 2013 and one month ago in January of 2015.  History of H. pylori.  The patient comes to the emergency today for chief complaint of dark tarry stool.  The patient states she does take iron daily however she had a bloody bowel movement that began this morning with "coffee ground stools."  The patient to the emergency department for evaluation.  She denies any racing or skipping of her heart, feelings of presyncope, weakness, dizziness.  Patient does complain of a small amount of wheezing currently.  She uses albuterol for her COPD. Patient denies any use of NSAIDS.  Past Medical History  Diagnosis Date  . Parkinson's disease   . Hypertension   . Arthritis   . Anemia   . Cellulitis of lower leg 12/24/2011  . Acute bronchitis   . Other abnormal blood chemistry   . Chronic systolic heart failure   . Acute on chronic systolic heart failure   . Cellulitis and abscess of leg, except foot   . Hypotension, unspecified   . Helicobacter pylori (H. pylori)   . Acute gastric ulcer with hemorrhage, without mention of obstruction    Past Surgical History  Procedure Laterality Date  . Hip arthroscopy w/ labral repair    . Thyroidectomy    . Esophagogastroduodenoscopy  09/26/2011    Procedure: ESOPHAGOGASTRODUODENOSCOPY (EGD);  Surgeon: Zenovia Jarred, MD;  Location: [facility] ENDOSCOPY;  Service: Gastroenterology;  Laterality: N/A;  To be done at bedside.  . Esophagogastroduodenoscopy N/A 09/30/2013    Procedure: ESOPHAGOGASTRODUODENOSCOPY (EGD);  Surgeon: [name], MD;   Location: [facility];  Service: Endoscopy;  Laterality: N/A;   Family History  Problem Relation Age of Onset  . GER disease Mother   . Heart disease Father    History  Substance Use Topics  . Smoking status: Never Smoker   . Smokeless tobacco: Never Used  . Alcohol Use: No   OB History   Grav Para Term Preterm Abortions TAB SAB Ect Mult Living                 Review of Systems  Constitutional: Negative for fever and chills.  Eyes: Negative for visual disturbance.  Respiratory: Positive for wheezing. Negative for chest tightness.   Cardiovascular: Negative for chest pain and palpitations.  Gastrointestinal: Positive for blood in stool. Negative for nausea, abdominal pain, diarrhea and rectal pain.  Genitourinary: Negative for dysuria and difficulty urinating.  Musculoskeletal: Negative for back pain, gait problem and myalgias.  Skin: Negative for rash.  Neurological: Negative for dizziness, weakness and light-headedness.  Psychiatric/Behavioral: Negative for behavioral problems.      Allergies  Codeine and Penicillins  Home Medications   Current Outpatient Rx  Name  Route  Sig  Dispense  Refill  . albuterol (PROVENTIL) (2.5 MG/3ML) 0.083% nebulizer solution   Nebulization   Take 2.5 mg by nebulization every 6 (six) hours as needed. For shortness of breath         . b complex vitamins tablet   Oral   Take 1 tablet by mouth daily.           Marland Kitchen  carbidopa-levodopa (SINEMET CR) 50-200 MG per tablet   Oral   Take 1 tablet by mouth at bedtime. 10pm   30 tablet   12   . carbidopa-levodopa (SINEMET IR) 25-100 MG per tablet   Oral   Take 1.5 tablets by mouth 4 (four) times daily. 6am, 10am, 2pm and 6pm   540 tablet   1   . FeFum-FePoly-FA-B Cmp-C-Biot (INTEGRA PLUS) CAPS   Oral   Take 1 capsule by mouth daily.   30 capsule   3   . furosemide (LASIX) 20 MG tablet   Oral   Take 2 tablets (40 mg total) by mouth daily. Sometimes she takes once daily,  sometimes twice daily   60 tablet   1   . Linaclotide (LINZESS) 145 MCG CAPS capsule   Oral   Take 1 capsule (145 mcg total) by mouth daily.   30 capsule   3   . magnesium oxide (MAG-OX) 400 MG tablet   Oral   Take 400 mg by mouth daily.          . pantoprazole (PROTONIX) 40 MG tablet   Oral   Take 1 tablet (40 mg total) by mouth 2 (two) times daily.   60 tablet   1   . pramipexole (MIRAPEX) 1.5 MG tablet   Oral   Take 1 tablet (1.5 mg total) by mouth 3 (three) times daily.   270 tablet   3   . sucralfate (CARAFATE) 1 G tablet   Oral   Take 1 tablet (1 g total) by mouth 4 (four) times daily -  with meals and at bedtime.   120 tablet   3   . EXPIRED: sucralfate (CARAFATE) 1 GM/10ML suspension   Oral   Take 10 mLs (1 g total) by mouth every 6 (six) hours.   420 mL   0   . triamcinolone cream (KENALOG) 0.1 %   Topical   Apply 1 application topically daily. Applied to legs          There were no vitals taken for this visit. Physical Exam  Nursing note and vitals reviewed. Constitutional: She appears well-developed and well-nourished.  HENT:  Head: Normocephalic and atraumatic.  Eyes: Conjunctivae are normal.  Neck: Normal range of motion. No JVD present.  Pulmonary/Chest: She has wheezes.  Abdominal: Soft. There is no tenderness.  Genitourinary:  Digital Rectal Exam reveals sphincter with good tone. No external hemorrhoids. No masses or fissures. Stool color is melanotic  Musculoskeletal:  tremor    ED Course  Procedures (including critical care time) Labs Review Labs Reviewed - No data to display Imaging Review No results found.  EKG Interpretation   None       MDM   Final diagnoses:  Occult blood positive stool  \patient with hemoccult pos. Stool.. Stable hgb. I have spoken with PA Laurann Montana form [facility]. She asks that the patent continue on all of her meds. She will follow up this week to have a repeat HGB with her pcp.Patient is  otherwise asymptomatic and appears safe for discharge at this time. Return precautions discussed.      Margarita Mail, PA-C 11/11/13 1735

## 2013-11-09 NOTE — ED Notes (Signed)
Per EMS pt from home c/o coffee-ground formed stool. Pt has 1 bm today. Denies abdominal pain, no nausea or vomiting. sts has had similar episode in January that required hospitalization. Pt called PCP this morning and PCP told her to come in. Pt sts it just started today, and is not as bad as last episode. Pt in NAD. Some increased labor of breathing noted pt lungs clear.

## 2013-11-10 DIAGNOSIS — I5022 Chronic systolic (congestive) heart failure: Secondary | ICD-10-CM | POA: Diagnosis not present

## 2013-11-10 DIAGNOSIS — K449 Diaphragmatic hernia without obstruction or gangrene: Secondary | ICD-10-CM | POA: Diagnosis not present

## 2013-11-10 DIAGNOSIS — I509 Heart failure, unspecified: Secondary | ICD-10-CM | POA: Diagnosis not present

## 2013-11-10 DIAGNOSIS — I10 Essential (primary) hypertension: Secondary | ICD-10-CM | POA: Diagnosis not present

## 2013-11-10 DIAGNOSIS — G20 Parkinson's disease: Secondary | ICD-10-CM | POA: Diagnosis not present

## 2013-11-10 DIAGNOSIS — R269 Unspecified abnormalities of gait and mobility: Secondary | ICD-10-CM | POA: Diagnosis not present

## 2013-11-11 ENCOUNTER — Ambulatory Visit (INDEPENDENT_AMBULATORY_CARE_PROVIDER_SITE_OTHER): Payer: Medicare Other | Admitting: Internal Medicine

## 2013-11-11 ENCOUNTER — Encounter: Payer: Self-pay | Admitting: Internal Medicine

## 2013-11-11 VITALS — BP 144/82 | HR 82 | Temp 97.4°F

## 2013-11-11 DIAGNOSIS — K449 Diaphragmatic hernia without obstruction or gangrene: Secondary | ICD-10-CM | POA: Diagnosis not present

## 2013-11-11 DIAGNOSIS — K5909 Other constipation: Secondary | ICD-10-CM

## 2013-11-11 DIAGNOSIS — I10 Essential (primary) hypertension: Secondary | ICD-10-CM | POA: Diagnosis not present

## 2013-11-11 DIAGNOSIS — K254 Chronic or unspecified gastric ulcer with hemorrhage: Secondary | ICD-10-CM

## 2013-11-11 DIAGNOSIS — I5042 Chronic combined systolic (congestive) and diastolic (congestive) heart failure: Secondary | ICD-10-CM | POA: Diagnosis not present

## 2013-11-11 DIAGNOSIS — I509 Heart failure, unspecified: Secondary | ICD-10-CM

## 2013-11-11 DIAGNOSIS — G20A1 Parkinson's disease without dyskinesia, without mention of fluctuations: Secondary | ICD-10-CM

## 2013-11-11 DIAGNOSIS — R269 Unspecified abnormalities of gait and mobility: Secondary | ICD-10-CM | POA: Diagnosis not present

## 2013-11-11 DIAGNOSIS — I5022 Chronic systolic (congestive) heart failure: Secondary | ICD-10-CM | POA: Diagnosis not present

## 2013-11-11 DIAGNOSIS — D509 Iron deficiency anemia, unspecified: Secondary | ICD-10-CM | POA: Diagnosis not present

## 2013-11-11 DIAGNOSIS — G20 Parkinson's disease: Secondary | ICD-10-CM

## 2013-11-11 DIAGNOSIS — K5900 Constipation, unspecified: Secondary | ICD-10-CM

## 2013-11-11 NOTE — Progress Notes (Signed)
Patient ID: [name], female   DOB: 04-20-1941, 73 y.o.   MRN: [id number]   Location:  [facility]  Code Status: DNR--this has been discussed in the office and [facility] paperwork completed in 11/14   Allergies  Allergen Reactions  . Codeine Other (See Comments)    unknown  . Penicillins Other (See Comments)    unknown    Chief Complaint  Patient presents with  . Hospitalization Follow-up    ER f/u from 11/09/13 with blood in stool and was told to make appointment with PCP     . other    feeling a little weak before going to the hospital and still a little bit and BM was dark in color.    HPI: Patient is a 73 y.o. white female seen in the office today for hospital f/u from GI bleeding.  She had gone to the ED on 2/17 with black stools.  She was found to be heme positive with stable h/h of 8.3/27.6.  Creatinine was 0.69.  She had just been started on integra plus caps by Dr. Hilarie Fredrickson at her GI f/u and has also been put on linzess by me for her constipation her last visit.    Back is more painful during the snowy very cold weather--relieved by lying flat and doing exercises from therapy.  Review of Systems:  Review of Systems  Constitutional: Positive for malaise/fatigue. Negative for fever.  Eyes: Negative for blurred vision.  Respiratory: Positive for shortness of breath. Negative for cough.   Cardiovascular: Negative for chest pain and leg swelling.       Relative  Gastrointestinal: Negative for abdominal pain, constipation, blood in stool and melena.       Having about daily bms now  Genitourinary: Negative for dysuria.  Neurological: Positive for weakness. Negative for headaches.       Tremors, freezing getting more frequent  Endo/Heme/Allergies: Bruises/bleeds easily.  Psychiatric/Behavioral: Positive for depression. Negative for memory loss.    Past Medical History  Diagnosis Date  . Parkinson's disease   . Hypertension   .  Arthritis   . Anemia   . Cellulitis of lower leg 12/24/2011  . Acute bronchitis   . Other abnormal blood chemistry   . Chronic systolic heart failure   . Acute on chronic systolic heart failure   . Cellulitis and abscess of leg, except foot   . Hypotension, unspecified   . Helicobacter pylori (H. pylori)   . Acute gastric ulcer with hemorrhage, without mention of obstruction     Past Surgical History  Procedure Laterality Date  . Hip arthroscopy w/ labral repair    . Thyroidectomy    . Esophagogastroduodenoscopy  09/26/2011    Procedure: ESOPHAGOGASTRODUODENOSCOPY (EGD);  Surgeon: Zenovia Jarred, MD;  Location: [facility] ENDOSCOPY;  Service: Gastroenterology;  Laterality: N/A;  To be done at bedside.  . Esophagogastroduodenoscopy N/A 09/30/2013    Procedure: ESOPHAGOGASTRODUODENOSCOPY (EGD);  Surgeon: [name], MD;  Location: [place];  Service: Endoscopy;  Laterality: N/A;    Social History:   reports that she has never smoked. She has never used smokeless tobacco. She reports that she does not drink alcohol or use illicit drugs.  Family History  Problem Relation Age of Onset  . GER disease Mother   . Heart disease Father     Medications: Patient's Medications  New Prescriptions   No medications on file  Previous Medications   ALBUTEROL (PROVENTIL) (2.5 MG/3ML) 0.083%  NEBULIZER SOLUTION    Take 2.5 mg by nebulization every 6 (six) hours as needed. For shortness of breath   B COMPLEX VITAMINS TABLET    Take 1 tablet by mouth daily.     CARBIDOPA-LEVODOPA (SINEMET CR) 50-200 MG PER TABLET    Take 1 tablet by mouth at bedtime. 10pm   CARBIDOPA-LEVODOPA (SINEMET IR) 25-100 MG PER TABLET    Take 1.5 tablets by mouth 4 (four) times daily. 6am, 10am, 2pm and 6pm   FEFUM-FEPOLY-FA-B CMP-C-BIOT (INTEGRA PLUS) CAPS    Take 1 capsule by mouth daily.   FUROSEMIDE (LASIX) 20 MG TABLET    Take 20 mg by mouth 2 (two) times daily.   LINACLOTIDE (LINZESS) 145 MCG CAPS CAPSULE    Take 1 capsule  (145 mcg total) by mouth daily.   PANTOPRAZOLE (PROTONIX) 40 MG TABLET    Take 1 tablet (40 mg total) by mouth 2 (two) times daily.   PRAMIPEXOLE (MIRAPEX) 1.5 MG TABLET    Take 1 tablet (1.5 mg total) by mouth 3 (three) times daily.   SUCRALFATE (CARAFATE) 1 G TABLET    Take 1 tablet (1 g total) by mouth 4 (four) times daily -  with meals and at bedtime.   TRIAMCINOLONE CREAM (KENALOG) 0.1 %    Apply 1 application topically daily as needed (rash). Applied to legs  Modified Medications   No medications on file  Discontinued Medications   No medications on file     Physical Exam: Filed Vitals:   11/11/13 1348  BP: 144/82  Pulse: 82  Temp: 97.4 F (36.3 C)  TempSrc: Oral  SpO2: 97%  Physical Exam  Constitutional: She is oriented to person, place, and time. No distress.  Cardiovascular: Normal rate, regular rhythm, normal heart sounds and intact distal pulses.   Pulmonary/Chest: Effort normal and breath sounds normal. She has no wheezes. She has no rales.  Abdominal: Soft. Bowel sounds are normal. She exhibits no distension and no mass. There is no tenderness.  Neurological: She is alert and oriented to person, place, and time. She exhibits abnormal muscle tone.  Resting tremors, had freezing episode trying to get up to go to the bathroom  Skin: Skin is warm and dry.  Seborrheic dermatitis  Psychiatric:  flat affect    Labs reviewed: Basic Metabolic Panel:  Recent Labs  07/30/13 1004 08/02/13 1500  10/15/13 0800 11/01/13 1339 11/09/13 1240  NA 137  --   < > 141 140 143  K 4.6  --   < > 4.5 4.0 4.0  CL 104  --   < > 103 100 104  CO2 25  --   < > 27 24 26   GLUCOSE 97  --   < > 125* 89 85  BUN 13  --   < > 22 16 15   CREATININE 0.63  --   < > 1.06 0.76 0.69  CALCIUM 8.9  --   < > 8.6 8.8 9.0  TSH  --  1.700  --   --   --   --   < > = values in this interval not displayed. Liver Function Tests:  Recent Labs  09/30/13 0655 10/06/13 0606 11/09/13 1240  AST 12 12 17     ALT <5 <5 <5  ALKPHOS 45 62 89  BILITOT 0.7 0.5 0.3  PROT 4.4* 5.5* 6.8  ALBUMIN 2.3* 2.5* 3.5    Recent Labs  04/09/13 11/09/13 1240  LIPASE 18 20  AMYLASE 28*  --  CBC:  Recent Labs  10/06/13 0606 10/12/13 0627 10/15/13 0800 11/01/13 1339 11/09/13 1240  WBC 8.9 7.9 8.9 7.2 6.2  NEUTROABS 6.2  --   --  5.1 4.0  HGB 8.7* 8.6* 8.9* 7.8* 8.3*  HCT 27.2* 27.3* 29.1* 26.0* 27.6*  MCV 83.4 82.5 82.4 78* 76.7*  PLT 185 282 303  --  213   Assessment/Plan 1. Gastric ulcer with hemorrhage -woke up with the GI bleeding on Monday, 2/16--stayed all day and sent home after getting protonix, zofran and IVF -no more melena since -still feeling weak  2. Anemia, iron deficiency -cont integra plus caps per GI for this -will f/u iron levels at next visit -f/u cbc again today  3. Parkinson's disease -niece in law is there most of the day helping with housework and brother also helps her at suppertime -next week is her last for home health -I discussed with her that she needs more help on an almost 24x7 basis at home by family or a paid caregiver if she wants to stay at home--she is having more freezing episodes and I am concerned about her safety -I also spoke with her brother who agrees with me but notes that her other relatives, as far as he knows, cannot be with her all of the time, only for a little at a time--he will encourage them to speak more with her about this and to drop by more often--he has suggested AL to her, but she has not been agreeable 4. Chronic combined systolic and diastolic congestive heart failure -breathing is pretty good right now--easily sob, but also recovers quickly  5. Chronic constipation -is taking linzess--is going more often than she was--does go about every day now  Labs/tests ordered:  Cbc today Next appt:  8 wks  Jerilee Space L. Rosevelt Luu, D.O. [facility] [address], [place] [address] Cell Phone  (Mon-Fri 8am-5pm):  [phone] On Call:  [phone] & follow prompts after 5pm & weekends Office Phone:  [phone] Office Fax:  [phone]

## 2013-11-12 DIAGNOSIS — I5022 Chronic systolic (congestive) heart failure: Secondary | ICD-10-CM | POA: Diagnosis not present

## 2013-11-12 DIAGNOSIS — K449 Diaphragmatic hernia without obstruction or gangrene: Secondary | ICD-10-CM | POA: Diagnosis not present

## 2013-11-12 DIAGNOSIS — I509 Heart failure, unspecified: Secondary | ICD-10-CM | POA: Diagnosis not present

## 2013-11-12 DIAGNOSIS — I10 Essential (primary) hypertension: Secondary | ICD-10-CM | POA: Diagnosis not present

## 2013-11-12 DIAGNOSIS — G20 Parkinson's disease: Secondary | ICD-10-CM | POA: Diagnosis not present

## 2013-11-12 DIAGNOSIS — R269 Unspecified abnormalities of gait and mobility: Secondary | ICD-10-CM | POA: Diagnosis not present

## 2013-11-12 LAB — CBC WITH DIFFERENTIAL/PLATELET
Basophils Absolute: 0.1 10*3/uL (ref 0.0–0.2)
Basos: 1 %
Eos: 4 %
Eosinophils Absolute: 0.2 10*3/uL (ref 0.0–0.4)
HCT: 26.6 % — ABNORMAL LOW (ref 34.0–46.6)
Hemoglobin: 7.9 g/dL — ABNORMAL LOW (ref 11.1–15.9)
Immature Grans (Abs): 0 10*3/uL (ref 0.0–0.1)
Immature Granulocytes: 0 %
Lymphocytes Absolute: 0.9 10*3/uL (ref 0.7–3.1)
Lymphs: 15 %
MCH: 22.6 pg — ABNORMAL LOW (ref 26.6–33.0)
MCHC: 29.7 g/dL — ABNORMAL LOW (ref 31.5–35.7)
MCV: 76 fL — ABNORMAL LOW (ref 79–97)
Monocytes Absolute: 0.6 10*3/uL (ref 0.1–0.9)
Monocytes: 10 %
Neutrophils Absolute: 4.4 10*3/uL (ref 1.4–7.0)
Neutrophils Relative %: 70 %
RBC: 3.5 x10E6/uL — ABNORMAL LOW (ref 3.77–5.28)
RDW: 18.3 % — ABNORMAL HIGH (ref 12.3–15.4)
WBC: 6.2 10*3/uL (ref 3.4–10.8)

## 2013-11-14 NOTE — ED Provider Notes (Signed)
Medical screening examination/treatment/procedure(s) were conducted as a shared visit with non-physician practitioner(s) and myself.  I personally evaluated the patient during the encounter.  EKG Interpretation   None      No acute abdomen. Rectal exam positive for heme. Hemoglobin stable. Will followup with [facility]  Nat Christen, MD 11/14/13 2046

## 2013-11-15 DIAGNOSIS — K449 Diaphragmatic hernia without obstruction or gangrene: Secondary | ICD-10-CM | POA: Diagnosis not present

## 2013-11-15 DIAGNOSIS — R269 Unspecified abnormalities of gait and mobility: Secondary | ICD-10-CM | POA: Diagnosis not present

## 2013-11-15 DIAGNOSIS — G20 Parkinson's disease: Secondary | ICD-10-CM | POA: Diagnosis not present

## 2013-11-15 DIAGNOSIS — I509 Heart failure, unspecified: Secondary | ICD-10-CM | POA: Diagnosis not present

## 2013-11-15 DIAGNOSIS — I5022 Chronic systolic (congestive) heart failure: Secondary | ICD-10-CM | POA: Diagnosis not present

## 2013-11-15 DIAGNOSIS — I10 Essential (primary) hypertension: Secondary | ICD-10-CM | POA: Diagnosis not present

## 2013-11-16 ENCOUNTER — Telehealth: Payer: Self-pay

## 2013-11-16 DIAGNOSIS — D649 Anemia, unspecified: Secondary | ICD-10-CM

## 2013-11-16 NOTE — Telephone Encounter (Signed)
Noted, thanks to Dr. Ardis Hughs

## 2013-11-16 NOTE — Telephone Encounter (Signed)
Dr. Fuller Plan is unavailable on Thursday am.  Dr. Ardis Hughs has agreed to do the case at [facility] on Thursday.

## 2013-11-16 NOTE — Addendum Note (Signed)
Addended by: Marlon Pel on: 11/16/2013 09:28 AM   Modules accepted: Orders

## 2013-11-16 NOTE — Telephone Encounter (Signed)
Patient uses [employer] [facility] for transportation and not able to come on Wed.

## 2013-11-16 NOTE — Telephone Encounter (Signed)
I spoke with the patient and she is weak, but denies any other complaints.  She is not having dark stool or abdominal pain .  She is scheduled for Thursday 11/18/13 9:30 with propofol at [facility] for Dr. Fuller Plan.  Patient will work on finding someone to be with her at the hospital for discharge.

## 2013-11-16 NOTE — Telephone Encounter (Signed)
I am in the [place] on Thursday morning and then have a GC meeting from 12:30-2:00 so I can't do it Thursday. I don't think she needs propofol so I think she is ok for moderate or propofol. Please check with Ardis Hughs for Thursday. I could do it Friday morning if [employer] on [address].

## 2013-11-17 DIAGNOSIS — G20 Parkinson's disease: Secondary | ICD-10-CM | POA: Diagnosis not present

## 2013-11-17 DIAGNOSIS — I10 Essential (primary) hypertension: Secondary | ICD-10-CM | POA: Diagnosis not present

## 2013-11-17 DIAGNOSIS — R269 Unspecified abnormalities of gait and mobility: Secondary | ICD-10-CM | POA: Diagnosis not present

## 2013-11-17 DIAGNOSIS — K449 Diaphragmatic hernia without obstruction or gangrene: Secondary | ICD-10-CM | POA: Diagnosis not present

## 2013-11-17 DIAGNOSIS — I5022 Chronic systolic (congestive) heart failure: Secondary | ICD-10-CM | POA: Diagnosis not present

## 2013-11-17 DIAGNOSIS — I509 Heart failure, unspecified: Secondary | ICD-10-CM | POA: Diagnosis not present

## 2013-11-18 ENCOUNTER — Other Ambulatory Visit: Payer: Self-pay

## 2013-11-18 DIAGNOSIS — K449 Diaphragmatic hernia without obstruction or gangrene: Secondary | ICD-10-CM | POA: Diagnosis not present

## 2013-11-18 DIAGNOSIS — I10 Essential (primary) hypertension: Secondary | ICD-10-CM | POA: Diagnosis not present

## 2013-11-18 DIAGNOSIS — G20 Parkinson's disease: Secondary | ICD-10-CM | POA: Diagnosis not present

## 2013-11-18 DIAGNOSIS — I509 Heart failure, unspecified: Secondary | ICD-10-CM | POA: Diagnosis not present

## 2013-11-18 DIAGNOSIS — R269 Unspecified abnormalities of gait and mobility: Secondary | ICD-10-CM | POA: Diagnosis not present

## 2013-11-18 DIAGNOSIS — I5022 Chronic systolic (congestive) heart failure: Secondary | ICD-10-CM | POA: Diagnosis not present

## 2013-11-19 ENCOUNTER — Encounter (HOSPITAL_COMMUNITY): Admission: RE | Payer: Self-pay | Source: Ambulatory Visit

## 2013-11-19 ENCOUNTER — Ambulatory Visit (HOSPITAL_COMMUNITY): Admission: RE | Admit: 2013-11-19 | Payer: Medicare Other | Source: Ambulatory Visit | Admitting: Gastroenterology

## 2013-11-19 ENCOUNTER — Telehealth: Payer: Self-pay | Admitting: Internal Medicine

## 2013-11-19 DIAGNOSIS — D649 Anemia, unspecified: Secondary | ICD-10-CM

## 2013-11-19 SURGERY — EGD (ESOPHAGOGASTRODUODENOSCOPY)
Anesthesia: Moderate Sedation

## 2013-11-19 NOTE — Telephone Encounter (Signed)
Patient was not able to make it in for EGD today with Dr. Fuller Plan.  What is the next option? Can she be done in the [place]? Henrene Pastor is hospital doc next week, do you want to talk with him?

## 2013-11-19 NOTE — Telephone Encounter (Signed)
I spoke with Dr. Hilarie Fredrickson.  Patient can't be done in the [place].  He would like a CBC for Monday and then will determine next step.  Patient aware

## 2013-11-22 ENCOUNTER — Other Ambulatory Visit (INDEPENDENT_AMBULATORY_CARE_PROVIDER_SITE_OTHER): Payer: Medicare Other

## 2013-11-22 DIAGNOSIS — D649 Anemia, unspecified: Secondary | ICD-10-CM | POA: Diagnosis not present

## 2013-11-22 LAB — CBC WITH DIFFERENTIAL/PLATELET
BASOS ABS: 0 10*3/uL (ref 0.0–0.1)
Basophils Relative: 0.5 % (ref 0.0–3.0)
EOS ABS: 0.4 10*3/uL (ref 0.0–0.7)
Eosinophils Relative: 6.1 % — ABNORMAL HIGH (ref 0.0–5.0)
HCT: 25.6 % — ABNORMAL LOW (ref 36.0–46.0)
Hemoglobin: 7.7 g/dL — CL (ref 12.0–15.0)
LYMPHS PCT: 13 % (ref 12.0–46.0)
Lymphs Abs: 0.8 10*3/uL (ref 0.7–4.0)
MCHC: 30 g/dL (ref 30.0–36.0)
MCV: 72.2 fl — AB (ref 78.0–100.0)
MONO ABS: 0.7 10*3/uL (ref 0.1–1.0)
Monocytes Relative: 11.1 % (ref 3.0–12.0)
NEUTROS ABS: 4.4 10*3/uL (ref 1.4–7.7)
Neutrophils Relative %: 69.3 % (ref 43.0–77.0)
Platelets: 226 10*3/uL (ref 150.0–400.0)
RBC: 3.55 Mil/uL — ABNORMAL LOW (ref 3.87–5.11)
RDW: 20.7 % — AB (ref 11.5–14.6)
WBC: 6.3 10*3/uL (ref 4.5–10.5)

## 2013-11-23 ENCOUNTER — Telehealth: Payer: Self-pay

## 2013-11-23 ENCOUNTER — Other Ambulatory Visit: Payer: Self-pay

## 2013-11-23 DIAGNOSIS — D649 Anemia, unspecified: Secondary | ICD-10-CM

## 2013-11-23 NOTE — Telephone Encounter (Signed)
Message copied by Barron Alvine on Tue Nov 23, 2013  8:03 AM ------      Message from: [name]      Created: Tue Nov 23, 2013  7:42 AM       Ulice Dash,      Not a problem, I can add her on to this Thursday.  I'll send to Annessa Satre.            Nakiea Metzner,      She needs EGD this Thursday at [facility], moderate sedation is ok.  I just sent you another one for Thursday (flex sig) and this could be to follow that last case.  She should have a cbc on arrival to endo as well, most recent Hb in low 7s.  Thanks.            dj                  ----- Message -----         From: Jerene Bears, MD         Sent: 11/22/2013   5:01 PM           To: Kellie Moor, RN, [name], MD            Dan      This lady, whom you have seen before, is my patient and needs an EGD in the hospital setting.  The weather messed her up last week.  I heard through the vine, that you might have an opening for EGD Thursday at [facility].      Her Hgb is drifting very slowly down on BID PPI      If you don't have a spot, I will look to see when I can squeeze her in      Thank you            Ulice Dash                  ----- Message -----         From: Annabell Sabal, CMA         Sent: 11/22/2013   4:59 PM           To: Jerene Bears, MD            Lab called with a critical hemoglobin 7.7             ------

## 2013-11-23 NOTE — Telephone Encounter (Signed)
Pt has been scheduled and instructed.  She will call with any questions or concerns.  Meds were reviewed

## 2013-11-24 ENCOUNTER — Ambulatory Visit: Payer: Medicare Other | Admitting: Nurse Practitioner

## 2013-11-25 ENCOUNTER — Telehealth: Payer: Self-pay

## 2013-11-25 ENCOUNTER — Ambulatory Visit (HOSPITAL_COMMUNITY)
Admission: RE | Admit: 2013-11-25 | Discharge: 2013-11-25 | Disposition: A | Discharge status: Home / Self Care | Payer: Medicare Other | Source: Ambulatory Visit | Attending: Gastroenterology | Admitting: Gastroenterology

## 2013-11-25 ENCOUNTER — Encounter (HOSPITAL_COMMUNITY)
Admission: RE | Disposition: A | Discharge status: Home / Self Care | Payer: Self-pay | Source: Ambulatory Visit | Attending: Gastroenterology

## 2013-11-25 ENCOUNTER — Encounter (HOSPITAL_COMMUNITY): Payer: Self-pay

## 2013-11-25 DIAGNOSIS — K922 Gastrointestinal hemorrhage, unspecified: Secondary | ICD-10-CM | POA: Insufficient documentation

## 2013-11-25 DIAGNOSIS — M129 Arthropathy, unspecified: Secondary | ICD-10-CM | POA: Diagnosis not present

## 2013-11-25 DIAGNOSIS — I509 Heart failure, unspecified: Secondary | ICD-10-CM | POA: Insufficient documentation

## 2013-11-25 DIAGNOSIS — R42 Dizziness and giddiness: Secondary | ICD-10-CM | POA: Diagnosis not present

## 2013-11-25 DIAGNOSIS — A048 Other specified bacterial intestinal infections: Secondary | ICD-10-CM | POA: Diagnosis not present

## 2013-11-25 DIAGNOSIS — I5042 Chronic combined systolic (congestive) and diastolic (congestive) heart failure: Secondary | ICD-10-CM | POA: Insufficient documentation

## 2013-11-25 DIAGNOSIS — G20 Parkinson's disease: Secondary | ICD-10-CM | POA: Diagnosis not present

## 2013-11-25 DIAGNOSIS — I10 Essential (primary) hypertension: Secondary | ICD-10-CM | POA: Insufficient documentation

## 2013-11-25 DIAGNOSIS — G20A1 Parkinson's disease without dyskinesia, without mention of fluctuations: Secondary | ICD-10-CM | POA: Insufficient documentation

## 2013-11-25 DIAGNOSIS — Z8711 Personal history of peptic ulcer disease: Secondary | ICD-10-CM | POA: Insufficient documentation

## 2013-11-25 DIAGNOSIS — Z79899 Other long term (current) drug therapy: Secondary | ICD-10-CM | POA: Insufficient documentation

## 2013-11-25 DIAGNOSIS — D649 Anemia, unspecified: Secondary | ICD-10-CM | POA: Diagnosis not present

## 2013-11-25 DIAGNOSIS — D509 Iron deficiency anemia, unspecified: Secondary | ICD-10-CM | POA: Insufficient documentation

## 2013-11-25 DIAGNOSIS — K5900 Constipation, unspecified: Secondary | ICD-10-CM | POA: Insufficient documentation

## 2013-11-25 DIAGNOSIS — R5381 Other malaise: Secondary | ICD-10-CM | POA: Diagnosis not present

## 2013-11-25 DIAGNOSIS — K449 Diaphragmatic hernia without obstruction or gangrene: Secondary | ICD-10-CM | POA: Diagnosis not present

## 2013-11-25 DIAGNOSIS — R5383 Other fatigue: Secondary | ICD-10-CM

## 2013-11-25 HISTORY — PX: ESOPHAGOGASTRODUODENOSCOPY: SHX5428

## 2013-11-25 LAB — CBC
HEMATOCRIT: 28.8 % — AB (ref 36.0–46.0)
Hemoglobin: 8.7 g/dL — ABNORMAL LOW (ref 12.0–15.0)
MCH: 22 pg — AB (ref 26.0–34.0)
MCHC: 30.2 g/dL (ref 30.0–36.0)
MCV: 72.7 fL — AB (ref 78.0–100.0)
Platelets: 189 10*3/uL (ref 150–400)
RBC: 3.96 MIL/uL (ref 3.87–5.11)
RDW: 18.6 % — ABNORMAL HIGH (ref 11.5–15.5)
WBC: 6.1 10*3/uL (ref 4.0–10.5)

## 2013-11-25 SURGERY — EGD (ESOPHAGOGASTRODUODENOSCOPY)
Anesthesia: Moderate Sedation

## 2013-11-25 MED ORDER — SODIUM CHLORIDE 0.9 % IV SOLN
INTRAVENOUS | Status: DC
  Administered 2013-11-25: 500 mL via INTRAVENOUS

## 2013-11-25 MED ORDER — FENTANYL CITRATE 0.05 MG/ML IJ SOLN
INTRAMUSCULAR | Status: DC | PRN
  Administered 2013-11-25: 25 ug via INTRAVENOUS

## 2013-11-25 MED ORDER — FENTANYL CITRATE 0.05 MG/ML IJ SOLN
INTRAMUSCULAR | Status: AC
  Filled 2013-11-25: qty 4

## 2013-11-25 MED ORDER — MIDAZOLAM HCL 10 MG/2ML IJ SOLN
INTRAMUSCULAR | Status: AC
  Filled 2013-11-25: qty 4

## 2013-11-25 MED ORDER — DIPHENHYDRAMINE HCL 50 MG/ML IJ SOLN
INTRAMUSCULAR | Status: AC
  Filled 2013-11-25: qty 1

## 2013-11-25 MED ORDER — BUTAMBEN-TETRACAINE-BENZOCAINE 2-2-14 % EX AERO
INHALATION_SPRAY | CUTANEOUS | Status: DC | PRN
  Administered 2013-11-25: 1 via TOPICAL

## 2013-11-25 MED ORDER — MIDAZOLAM HCL 10 MG/2ML IJ SOLN
INTRAMUSCULAR | Status: DC | PRN
  Administered 2013-11-25: 2 mg via INTRAVENOUS

## 2013-11-25 NOTE — Telephone Encounter (Signed)
Vicki @ [facility] called to see if you would sign patient's "initial 485" (Plan of care, home health Cert) if not she said she understood and could see if one of the other MDs' would sign it.

## 2013-11-25 NOTE — H&P (View-Only) (Signed)
Patient ID: [name], female   DOB: 01/24/1941, 73 y.o.   MRN: [id number]   Location:  [facility] [place] [facility]  Code Status: DNR as per discussion here in the office and [facility] completed several months ago  Allergies  Allergen Reactions  . Codeine Other (See Comments)    unknown  . Penicillins Other (See Comments)    unknown    Chief Complaint  Patient presents with  . Medical Managment of Chronic Issues    3 month follow-up   . Hospitalization Follow-up    Hospital follow-up on Gastric Ulcer   . Medication Management    Discuss side effects to Pramipexole 1.5 mg tab     HPI: Patient is a 73 y.o. white female seen in the office today for hospital f/u on gastric ulcer with GI bleed.  Still feels weak.  No more bleeding.  Breathing still short sometimes when she tries to do something.  Pramipexole has side effects that sound similar to her symptoms of dizziness, weakness, nausea.  Sometimes has to back up and that makes her dizzy.  She is falling asleep easily.  Is doing PT, OT at home.    Review of Systems:  Review of Systems  Constitutional: Positive for malaise/fatigue. Negative for fever, chills and weight loss.       Has gained weight  Eyes: Negative for blurred vision.       Wears glasses  Respiratory: Negative for shortness of breath.   Cardiovascular: Negative for chest pain.  Gastrointestinal: Positive for heartburn, abdominal pain and constipation. Negative for blood in stool and melena.  Genitourinary: Negative for dysuria.  Musculoskeletal: Negative for falls and myalgias.  Neurological: Positive for dizziness, tremors and weakness. Negative for loss of consciousness and headaches.       Parkinson's disease  Psychiatric/Behavioral: Positive for depression. Negative for memory loss.       Today    Past Medical History  Diagnosis Date  . Parkinson's disease   . Hypertension   . Arthritis   . Anemia   . Cellulitis of lower  leg 12/24/2011  . Acute bronchitis   . Other abnormal blood chemistry   . Chronic systolic heart failure   . Acute on chronic systolic heart failure   . Cellulitis and abscess of leg, except foot   . Hypotension, unspecified   . Helicobacter pylori (H. pylori)   . Acute gastric ulcer with hemorrhage, without mention of obstruction     Past Surgical History  Procedure Laterality Date  . Hip arthroscopy w/ labral repair    . Thyroidectomy    . Esophagogastroduodenoscopy  09/26/2011    Procedure: ESOPHAGOGASTRODUODENOSCOPY (EGD);  Surgeon: Zenovia Jarred, MD;  Location: [facility] ENDOSCOPY;  Service: Gastroenterology;  Laterality: N/A;  To be done at bedside.  . Esophagogastroduodenoscopy N/A 09/30/2013    Procedure: ESOPHAGOGASTRODUODENOSCOPY (EGD);  Surgeon: [name], MD;  Location: [place];  Service: Endoscopy;  Laterality: N/A;    Social History:   reports that she has never smoked. She has never used smokeless tobacco. She reports that she does not drink alcohol or use illicit drugs.  Family History  Problem Relation Age of Onset  . GER disease Mother   . Heart disease Father     Medications: Patient's Medications  New Prescriptions   No medications on file  Previous Medications   ALBUTEROL (PROVENTIL) (2.5 MG/3ML) 0.083% NEBULIZER SOLUTION    Take 2.5 mg by nebulization every 6 (six)  hours as needed. For shortness of breath   B COMPLEX VITAMINS TABLET    Take 1 tablet by mouth daily.     CARBIDOPA-LEVODOPA (SINEMET CR) 50-200 MG PER TABLET    Take 1 tablet by mouth at bedtime. 10pm   CARBIDOPA-LEVODOPA (SINEMET IR) 25-100 MG PER TABLET    Take 1.5 tablets by mouth 4 (four) times daily. 6am, 10am, 2pm and 6pm   DOCUSATE SODIUM 100 MG CAPS    Take 200 mg by mouth 2 (two) times daily.   FUROSEMIDE (LASIX) 20 MG TABLET    Take 2 tablets (40 mg total) by mouth daily. Sometimes she takes once daily, sometimes twice daily   MAGNESIUM OXIDE (MAG-OX) 400 MG TABLET    Take 400 mg by  mouth daily.    PANTOPRAZOLE (PROTONIX) 40 MG TABLET    Take 1 tablet (40 mg total) by mouth 2 (two) times daily.   PRAMIPEXOLE (MIRAPEX) 1.5 MG TABLET    Take 1 tablet (1.5 mg total) by mouth 3 (three) times daily.   TRIAMCINOLONE CREAM (KENALOG) 0.1 %    Apply 1 application topically daily. Applied to legs  Modified Medications   No medications on file  Discontinued Medications   No medications on file     Physical Exam: Filed Vitals:   11/01/13 1230  BP: 130/74  Pulse: 80  Temp: 98 F (36.7 C)  TempSrc: Oral  Resp: 10  Weight: 184 lb (83.462 kg)  SpO2: 95%  Physical Exam  Constitutional: She is oriented to person, place, and time. She appears well-developed and well-nourished. No distress.  HENT:  Head: Normocephalic and atraumatic.  Cardiovascular: Normal rate and regular rhythm.   Murmur heard. Pulmonary/Chest: Effort normal and breath sounds normal. No respiratory distress. She has no rales.  Abdominal: Soft. Bowel sounds are normal. She exhibits no distension and no mass. There is no tenderness.  Musculoskeletal: Normal range of motion. She exhibits no tenderness.  2+ pitting edema above top of ted hose  Neurological: She is alert and oriented to person, place, and time.  Skin: Skin is warm and dry. There is pallor.  Psychiatric:  Affect flat today--usually Happier   Labs reviewed: Basic Metabolic Panel:  Recent Labs  07/30/13 1004 08/02/13 1500  10/02/13 0422 10/06/13 0606 10/15/13 0800  NA 137  --   < > 139 136* 141  K 4.6  --   < > 4.0 4.6 4.5  CL 104  --   < > 103 100 103  CO2 25  --   < > 29 29 27   GLUCOSE 97  --   < > 109* 95 125*  BUN 13  --   < > 13 18 22   CREATININE 0.63  --   < > 0.80 0.85 1.06  CALCIUM 8.9  --   < > 8.2* 8.7 8.6  TSH  --  1.700  --   --   --   --   < > = values in this interval not displayed. Liver Function Tests:  Recent Labs  07/30/13 1004 09/29/13 0923 09/30/13 0655 10/06/13 0606  AST 21 13 12 12   ALT <5 5 <5 <5    ALKPHOS 82 52 45 62  BILITOT 0.5 0.3 0.7 0.5  PROT 6.3 4.9* 4.4* 5.5*  ALBUMIN 3.1*  --  2.3* 2.5*    Recent Labs  04/09/13  LIPASE 18  AMYLASE 28*  CBC:  Recent Labs  09/29/13 0923 09/29/13 1443  10/06/13 0606 10/12/13  7517 10/15/13 0800  WBC 13.0* 12.2*  < > 8.9 7.9 8.9  NEUTROABS 9.5* 10.2*  --  6.2  --   --   HGB 5.8* 5.5*  < > 8.7* 8.6* 8.9*  HCT 18.4* 17.2*  < > 27.2* 27.3* 29.1*  MCV 78* 78.2  < > 83.4 82.5 82.4  PLT  --  142*  < > 185 282 303  < > = values in this interval not displayed.  Assessment/Plan 1. Gastric ulcer with hemorrhage - will f/u labs to see where anemia stands at this point as it is probably the reason for her other symptoms at this time - CBC with Differential - Basic metabolic panel  2. Anemia, iron deficiency -f/u cbc, is not on iron at this point--may need to restart, but does have severe constipation problems, as well  3. Parkinson's disease -cont current meds and keep f/u with [facility]  4. Chronic combined systolic and diastolic congestive heart failure -has gained weight, but is following cardiac prudent diet and using compression hose, elevating feet -does not have clinical evidence of chf aside from the weight gain (may be due to increased help at home--has nephew's wife coming in, brother visiting nightly with supper, meals on wheels, pt, ot)  5. Dizziness -continue PT, OT at home, suspect this is residual from her anemia from the gi bleeding  6. Generalized weakness -again likely anemia related--cont home health therapy and using rollator  7. Chronic constipation -has failed typical routines of stool softeners, miralax multiple doses a day, even suppositories - Linaclotide (LINZESS) 145 MCG CAPS capsule; Take 1 capsule (145 mcg total) by mouth daily.  Dispense: 30 capsule; Refill: 3  Labs/tests ordered:   Orders Placed This Encounter  Procedures  . CBC with Differential  . Basic metabolic panel   Next appt:   3 mos, and prn.

## 2013-11-25 NOTE — Discharge Instructions (Signed)

## 2013-11-25 NOTE — Telephone Encounter (Signed)
i will sign it

## 2013-11-25 NOTE — Interval H&P Note (Signed)
History and Physical Interval Note:  11/25/2013 2:12 PM  [name]  has presented today for surgery, with the diagnosis of Anemia [285.9]  The various methods of treatment have been discussed with the patient and family. After consideration of risks, benefits and other options for treatment, the patient has consented to  Procedure(s): ESOPHAGOGASTRODUODENOSCOPY (EGD) (N/A) as a surgical intervention .  The patient's history has been reviewed, patient examined, no change in status, stable for surgery.  I have reviewed the patient's chart and labs.  Questions were answered to the patient's satisfaction.     [name]

## 2013-11-25 NOTE — Op Note (Signed)
[facility] [place], [address]   ENDOSCOPY PROCEDURE REPORT  PATIENT: [name], [name]  MR#: [id number] BIRTHDATE: 11/30/1940 , 72  yrs. old GENDER: Female ENDOSCOPIST: [name], MD PROCEDURE DATE:  11/25/2013 PROCEDURE:  EGD, diagnostic ASA CLASS:     Class III INDICATIONS:  recurrent GI bleeding, known gastric ulcers. MEDICATIONS: Fentanyl 25 mcg IV and Versed 2 mg IV TOPICAL ANESTHETIC: Cetacaine Spray  DESCRIPTION OF PROCEDURE: After the risks benefits and alternatives of the procedure were thoroughly explained, informed consent was obtained.  The Pentax Gastroscope [id number] endoscope was introduced through the mouth and advanced to the second portion of the duodenum. Without limitations.  The instrument was slowly withdrawn as the mucosa was fully examined.    There was a medium to larged sized hiatal hernia.  Along the lip of of the hernia, there were several edematous gastric folds.  There were no clear uclers or erosions.  The previously noted ulcer (EGD 09/2013) has completely healed.  There was no blood in UGI tract. Retroflexed views revealed no abnormalities.     The scope was then withdrawn from the patient and the procedure completed.  COMPLICATIONS: There were no complications. ENDOSCOPIC IMPRESSION: There was a medium to larged sized hiatal hernia (this may be responsible for your bleeding, anemia).  The previously noted ulcer (EGD 09/2013) has completely healed.  There was no blood in UGI tract.  RECOMMENDATIONS: As recommended by Dr. Hilarie Fredrickson in recent office visit, you should continue twice daily protonix, 3-4 times daily carafate, and once daily iron supplement.  Your Hb just prior to this procedure was 8.7 (higher than in the past month).  [facility] office will get in touch with you about a return visit in the office in 6-7 weeks, lab draw the day prior. If you blood counts fail to normalize, will have to consider  referral to general surgery to consider Hiatal Hernia repair.   eSigned:  [name], MD 11/25/2013 2:48 PM   CC: Zenovia Jarred, MD

## 2013-11-26 NOTE — Telephone Encounter (Signed)
Paperwork was faxed to [place] on 11/25/13. On Dr. Naaman Plummer desk for review.

## 2013-11-29 ENCOUNTER — Encounter (HOSPITAL_COMMUNITY): Payer: Self-pay | Admitting: Gastroenterology

## 2013-12-01 DIAGNOSIS — K449 Diaphragmatic hernia without obstruction or gangrene: Secondary | ICD-10-CM | POA: Diagnosis not present

## 2013-12-01 DIAGNOSIS — I5022 Chronic systolic (congestive) heart failure: Secondary | ICD-10-CM | POA: Diagnosis not present

## 2013-12-01 DIAGNOSIS — R269 Unspecified abnormalities of gait and mobility: Secondary | ICD-10-CM | POA: Diagnosis not present

## 2013-12-01 DIAGNOSIS — I10 Essential (primary) hypertension: Secondary | ICD-10-CM | POA: Diagnosis not present

## 2013-12-01 DIAGNOSIS — I509 Heart failure, unspecified: Secondary | ICD-10-CM | POA: Diagnosis not present

## 2013-12-01 DIAGNOSIS — G20 Parkinson's disease: Secondary | ICD-10-CM | POA: Diagnosis not present

## 2013-12-20 ENCOUNTER — Encounter: Payer: Self-pay | Admitting: Internal Medicine

## 2013-12-23 ENCOUNTER — Ambulatory Visit (INDEPENDENT_AMBULATORY_CARE_PROVIDER_SITE_OTHER): Payer: Medicare Other | Admitting: Internal Medicine

## 2013-12-23 ENCOUNTER — Other Ambulatory Visit (INDEPENDENT_AMBULATORY_CARE_PROVIDER_SITE_OTHER): Payer: Medicare Other

## 2013-12-23 ENCOUNTER — Encounter: Payer: Self-pay | Admitting: Internal Medicine

## 2013-12-23 VITALS — BP 152/86 | HR 72 | Ht 64.0 in | Wt 172.8 lb

## 2013-12-23 DIAGNOSIS — K279 Peptic ulcer, site unspecified, unspecified as acute or chronic, without hemorrhage or perforation: Secondary | ICD-10-CM | POA: Diagnosis not present

## 2013-12-23 DIAGNOSIS — D509 Iron deficiency anemia, unspecified: Secondary | ICD-10-CM

## 2013-12-23 DIAGNOSIS — K449 Diaphragmatic hernia without obstruction or gangrene: Secondary | ICD-10-CM

## 2013-12-23 LAB — FERRITIN: Ferritin: 13.4 ng/mL (ref 10.0–291.0)

## 2013-12-23 LAB — CBC
HEMATOCRIT: 29.3 % — AB (ref 36.0–46.0)
HEMOGLOBIN: 8.9 g/dL — AB (ref 12.0–15.0)
MCHC: 30.4 g/dL (ref 30.0–36.0)
MCV: 68.5 fl — AB (ref 78.0–100.0)
Platelets: 252 10*3/uL (ref 150.0–400.0)
RBC: 4.28 Mil/uL (ref 3.87–5.11)
RDW: 22.5 % — AB (ref 11.5–14.6)
WBC: 7.5 10*3/uL (ref 4.5–10.5)

## 2013-12-23 LAB — IBC PANEL
Iron: 120 ug/dL (ref 42–145)
Saturation Ratios: 28.3 % (ref 20.0–50.0)
Transferrin: 303.3 mg/dL (ref 212.0–360.0)

## 2013-12-23 MED ORDER — IRON 325 (65 FE) MG PO TABS: 1.0000 | ORAL_TABLET | Freq: Every day | ORAL | Status: DC

## 2013-12-23 NOTE — Patient Instructions (Signed)
Your physician has requested that you go to the basement for the following lab work before leaving today: Iron Studies  We have sent the following medications to your pharmacy for you to pick up at your convenience: Iron  Please return to our lab in 3 months at your convenience for a repeat iron study July 2015

## 2013-12-23 NOTE — Progress Notes (Signed)
Subjective:    Patient ID: [name], female    DOB: 30-Jan-1941, 73 y.o.   MRN: [id number]  HPI [name] is a 73 year old female with a past mental history of gastric ulcers, Parkinson's disease, hypertension, CHF, H. pylori infection status post treatment who is seen in followup. [name] has a history of gastric ulcers, most recently admitted with GI bleeding January 2015. Her hemoglobin had been declining and so in early March she had an upper endoscopy by Dr. Ardis Hughs. This was done to evaluate persistent and not improving anemia. This revealed a large hiatal hernia without obvious Cameron's lesions. The previously seen gastric ulcer had healed completely. The duodenum was unremarkable. And there was no blood in the upper GI tract seen. She has been maintained on pantoprazole 40 mg twice daily Carafate 4 times a day and iron supplementation. She reports her insurance is not covering Integra and she asked for another option.  On hold today she reports she feels well. She's not seen further melena. No rectal bleeding. She reports a good appetite without nausea or vomiting. She denies abdominal pain. Still has a hard time getting around with her Parkinson's disease but no recent falls. Carafate has been hard for her to remember to take it she is doing her best.  Review of Systems As per history of present illness, otherwise negative  Current Medications, Allergies, Past Medical History, Past Surgical History, Family History and Social History were reviewed in [employer] record.     Objective:   Physical Exam BP 152/86  Pulse 72  Ht 5\' 4"  (1.626 m)  Wt 172 lb 12.8 oz (78.382 kg)  BMI 29.65 kg/m2 Constitutional: Chronically ill-appearing female in no acute distress  HEENT: Normocephalic and atraumatic. No scleral icterus.  Cardiovascular: Normal rate, regular rhythm and intact distal pulses.  Pulmonary/chest: Effort normal and breath sounds normal. No wheezing,  rales or rhonchi.  Abdominal: Soft, nontender, nondistended. Bowel sounds active throughout.  Extremities: no clubbing, cyanosis, or edema  Neurological: Alert and oriented to person place and time. Resting tremor  Psychiatric: Normal mood and affect. Behavior is normal.   Results for [name], [name] (MRN [id number]) as of 12/23/2013 14:09  Ref. Range 10/12/2013 06:27 10/15/2013 08:00 11/01/2013 13:39 11/09/2013 12:40 11/11/2013 14:52 11/22/2013 16:26 11/25/2013 13:20 12/23/2013 11:07  Hemoglobin Latest Range: 12.0-15.0 g/dL 8.6 (L) 8.9 (L) 7.8 (L) 8.3 (L) 7.9 (L) 7.7 Repeated and verified X2. (LL) 8.7 (L) 8.9 (L)  HCT Latest Range: 36.0-46.0 % 27.3 (L) 29.1 (L) 26.0 (L) 27.6 (L) 26.6 (L) 25.6 Repeated and verified X2. (L) 28.8 (L) 29.3 (L)   Iron/TIBC/Ferritin    Component Value Date/Time   IRON 120 12/23/2013 1107   TIBC 392 03/30/2013 0829   FERRITIN 13.4 12/23/2013 1107   FERRITIN 8* 03/30/2013 0829       Assessment & Plan:  73 year old female with a past mental history of gastric ulcers, Parkinson's disease, hypertension, CHF, H. pylori infection status post treatment who is seen in followup.  1. Recent gastric ulcers/large hiatal hernia/iron def. Anemia -- she has a history of H. pylori which has been treated. Fortunately her gastric ulcers have healed. She is at risk for Cameron's erosions and ulcers due to her large hiatal hernia, but she is a poor surgical candidate for hiatal hernia repair. I've recommended she maintain pantoprazole 40 mg twice daily for now. She can discontinue Carafate. Her hemoglobin is slowly improving as is her ferritin. I have recommended that  she continue iron supplements daily. I would like to repeat a CBC and iron studies in 3 months. I've asked that she notify me immediately should she develop recurrent melena or any evidence for GI bleeding. She voices understanding. She is happy with the plan.

## 2013-12-27 ENCOUNTER — Other Ambulatory Visit: Payer: Self-pay | Admitting: Gastroenterology

## 2013-12-27 DIAGNOSIS — D649 Anemia, unspecified: Secondary | ICD-10-CM

## 2013-12-28 ENCOUNTER — Other Ambulatory Visit: Payer: Self-pay

## 2013-12-28 DIAGNOSIS — K259 Gastric ulcer, unspecified as acute or chronic, without hemorrhage or perforation: Secondary | ICD-10-CM

## 2013-12-28 MED ORDER — PANTOPRAZOLE SODIUM 40 MG PO TBEC: 40.0000 mg | DELAYED_RELEASE_TABLET | Freq: Two times a day (BID) | ORAL | Status: DC

## 2014-01-06 ENCOUNTER — Ambulatory Visit: Payer: Medicare Other | Admitting: Internal Medicine

## 2014-01-19 ENCOUNTER — Telehealth: Payer: Self-pay | Admitting: Internal Medicine

## 2014-01-19 NOTE — Telephone Encounter (Signed)
after discussing with Nicoletta Ba PA I have advised her it is ok to have 2 teeth pulled today for her anemia

## 2014-01-25 ENCOUNTER — Telehealth: Payer: Self-pay | Admitting: Nurse Practitioner

## 2014-01-25 MED ORDER — CARBIDOPA-LEVODOPA 25-100 MG PO TABS
1.5000 | ORAL_TABLET | Freq: Four times a day (QID) | ORAL | Status: DC
Start: 2014-01-25 — End: 2014-01-28

## 2014-01-25 NOTE — Telephone Encounter (Signed)
Changed insurance so her new pharmacy is [name] on [address]. Needs RX for carbidopa-levodopa (SINEMET IR) 25-100 MG per tablet

## 2014-01-25 NOTE — Telephone Encounter (Signed)
Rx has been sent  

## 2014-01-28 ENCOUNTER — Encounter (INDEPENDENT_AMBULATORY_CARE_PROVIDER_SITE_OTHER): Payer: Self-pay

## 2014-01-28 ENCOUNTER — Encounter: Payer: Self-pay | Admitting: Nurse Practitioner

## 2014-01-28 ENCOUNTER — Ambulatory Visit (INDEPENDENT_AMBULATORY_CARE_PROVIDER_SITE_OTHER): Payer: Medicare Other | Admitting: Nurse Practitioner

## 2014-01-28 VITALS — BP 154/80 | HR 101

## 2014-01-28 DIAGNOSIS — G20 Parkinson's disease: Secondary | ICD-10-CM | POA: Diagnosis not present

## 2014-01-28 DIAGNOSIS — R269 Unspecified abnormalities of gait and mobility: Secondary | ICD-10-CM | POA: Diagnosis not present

## 2014-01-28 MED ORDER — PRAMIPEXOLE DIHYDROCHLORIDE 1.5 MG PO TABS: 1.5000 mg | ORAL_TABLET | Freq: Three times a day (TID) | ORAL | Status: DC

## 2014-01-28 MED ORDER — CARBIDOPA-LEVODOPA 25-100 MG PO TABS: 1.5000 | ORAL_TABLET | Freq: Four times a day (QID) | ORAL | Status: DC

## 2014-01-28 MED ORDER — CARBIDOPA-LEVODOPA ER 50-200 MG PO TBCR: 1.0000 | EXTENDED_RELEASE_TABLET | Freq: Every day | ORAL | Status: DC

## 2014-01-28 NOTE — Progress Notes (Signed)
[facility]  PATIENT: [name] DOB: [birth year]   REASON FOR VISIT: follow up for parkinsons disease   HISTORY OF PRESENT ILLNESS: [name], 73 year old female returns for followup. She has a history of Parkinson's disease was last seen by Dr. Krista Blue 05/25/13. She has had one fall since that time and then some time in rehabilitation. She has also had a hospitalization for questionable GI bleed and anemia. She is back at home. She continues on her Parkinson's meds with dyskinesias at times. She get some help from family members. She can she feed herself she needs assistance with bathing and dressing. She uses a walker at home but she is seated in a wheelchair today. She returns for reevaluation  HISTORY: She had a history of Parkinson's disease since 1999, also has past medical history of hypertension, reported history of stroke. Initial symptoms were finger stiffness, difficulty manipulating, gait change, decreased arm swing, and also right hemisensory loss. But over the years, she has developed typical parkinsonian features, including resting tremor, bradykinesia, rigidity, and gait difficulty.  She has odd sleeping habit, goes to bed 6:00 every evening, wakes up around 2:00 in the morning, takes a nap about 10 AM.  She has history of low back, left buttock area pain, shooting along left posterior thigh and leg, severe in 04/2011, x-ray and later MRI -lumbar has demonstrated T12 compression fracture, , most severe at L2-3, L3-4. She had lumbar brace and later Kyphoplasty by Dr. Dietrich Pates in 07/13/2009, overall, back pain is better, but still there, also has left lateral thigh/leg numbness and pain. She denies incontinence.She sees Dr. Nelva Bush for pain management.  Bone density scan c/w osteopenia, on vitD and calcium supplement. Recent sleep study by PCP confirms OSA but pt says insurance does not cover CPAP. Reports independent in ADL's, no longer driving. Gets mobile meals during the  week.  She was admitted to hospital in May 2013, suffered GI bleeding and chest pain then, more tremor before the next dose of sinemet. she has increased gait difficulty, pain playing a major role here, she has urinary frequency, urgency, because she is taking Lasix, she lives by herself, 2 of her 5 brothers is actively involved in helping her, She uses public transportations.  Since last visit was in 08/11/12: She was changed to Sinemet 25/100 one and 1/2 tabs qid at 6, 10, 14, 18 pm, plus night time sinemet CR 50/200, also taking Mirapex 1.5mg  3 tabs tid, Her low back pain has much improved, she could not afford Azilect. She noticed some jerking movement during peak dose of Sinemet.    REVIEW OF SYSTEMS: Full 14 system review of systems performed and notable only for those listed, all others are neg:  Constitutional: N/A  Cardiovascular: N/A  Ear/Nose/Throat: N/A  Skin: N/A  Eyes: N/A  Respiratory: Shortness of breath  Gastroitestinal: N/A  Hematology/Lymphatic: N/A  Endocrine: N/A Musculoskeletal back pain Allergy/Immunology: N/A  Neurological: Tremors Psychiatric: N/A Sleep : NA   ALLERGIES: Allergies  Allergen Reactions  . Codeine Other (See Comments)    unknown  . Penicillins Other (See Comments)    unknown    HOME MEDICATIONS: Outpatient Prescriptions Prior to Visit  Medication Sig Dispense Refill  . albuterol (PROVENTIL) (2.5 MG/3ML) 0.083% nebulizer solution Take 2.5 mg by nebulization every 6 (six) hours as needed. For shortness of breath      . b complex vitamins tablet Take 1 tablet by mouth daily.        . carbidopa-levodopa (  SINEMET CR) 50-200 MG per tablet Take 1 tablet by mouth at bedtime. 10pm  30 tablet  12  . carbidopa-levodopa (SINEMET IR) 25-100 MG per tablet Take 1.5 tablets by mouth 4 (four) times daily. 6am, 10am, 2pm and 6pm  180 tablet  0  . Ferrous Sulfate (IRON) 325 (65 FE) MG TABS Take 1 capsule by mouth daily.  30 each  6  . furosemide (LASIX) 20  MG tablet Take 20 mg by mouth 2 (two) times daily.      . pantoprazole (PROTONIX) 40 MG tablet Take 1 tablet (40 mg total) by mouth 2 (two) times daily.  60 tablet  6  . pramipexole (MIRAPEX) 1.5 MG tablet Take 1 tablet (1.5 mg total) by mouth 3 (three) times daily.  270 tablet  3  . sucralfate (CARAFATE) 1 G tablet Take 1 tablet (1 g total) by mouth 4 (four) times daily -  with meals and at bedtime.  120 tablet  3  . triamcinolone cream (KENALOG) 0.1 % Apply 1 application topically daily as needed (rash). Applied to legs       Facility-Administered Medications Prior to Visit  Medication Dose Route Frequency Provider Last Rate Last Dose  . ipratropium-albuterol (DUONEB) 0.5-2.5 (3) MG/3ML nebulizer solution 3 mL  3 mL Nebulization Q4H Gayland Curry, DO        PAST MEDICAL HISTORY: Past Medical History  Diagnosis Date  . Parkinson's disease   . Hypertension   . Arthritis   . Anemia   . Cellulitis of lower leg 12/24/2011  . Acute bronchitis   . Other abnormal blood chemistry   . Chronic systolic heart failure   . Acute on chronic systolic heart failure   . Cellulitis and abscess of leg, except foot   . Hypotension, unspecified   . Helicobacter pylori (H. pylori)   . Acute gastric ulcer with hemorrhage, without mention of obstruction     PAST SURGICAL HISTORY: Past Surgical History  Procedure Laterality Date  . Hip arthroscopy w/ labral repair    . Thyroidectomy    . Esophagogastroduodenoscopy  09/26/2011    Procedure: ESOPHAGOGASTRODUODENOSCOPY (EGD);  Surgeon: Zenovia Jarred, MD;  Location: [facility] ENDOSCOPY;  Service: Gastroenterology;  Laterality: N/A;  To be done at bedside.  . Esophagogastroduodenoscopy N/A 09/30/2013    Procedure: ESOPHAGOGASTRODUODENOSCOPY (EGD);  Surgeon: [name], MD;  Location: [place];  Service: Endoscopy;  Laterality: N/A;  . Esophagogastroduodenoscopy N/A 11/25/2013    Procedure: ESOPHAGOGASTRODUODENOSCOPY (EGD);  Surgeon: [name], MD;  Location:  [facility];  Service: Endoscopy;  Laterality: N/A;    FAMILY HISTORY: Family History  Problem Relation Age of Onset  . GER disease Mother   . Heart disease Father     SOCIAL HISTORY: History   Social History  . Marital Status: Single    Spouse Name: N/A    Number of Children: 0  . Years of Education: 12   Occupational History  .      Retired   Social History Main Topics  . Smoking status: Never Smoker   . Smokeless tobacco: Never Used  . Alcohol Use: No  . Drug Use: No  . Sexual Activity: No   Other Topics Concern  . Not on file   Social History Narrative   Patient lives at home alone. Patient is retired. Patient has high school education.   Caffeine- one daily.   Right handed.     PHYSICAL EXAM  Filed Vitals:   01/28/14 5284  BP: 154/80  Pulse: 101   Cannot calculate BMI with a height equal to zero.  Generalized: Well developed, in no acute distress  Head: normocephalic and atraumatic,. Oropharynx benign  Neck: Supple, no carotid bruits  Cardiac: Regular rate rhythm, no murmur  Respiratory few wheezes Musculoskeletal: No deformity   Neurological examination   Mentation: Alert oriented to time, place, history taking. MMSE 27/30 missing  items in calculation. AFT 9. Follows all commands speech and language fluent  Cranial nerve II-XII: Pupils were equal round reactive to light extraocular movements were full, visual field were full on confrontational test. Facial sensation and strength were normal. hearing was intact to finger rubbing bilaterally. Uvula tongue midline. head turning and shoulder shrug were normal and symmetric.Tongue protrusion into cheek strength was normal. Motor: normal bulk and tone, full strength in the BUE, BLE, resting tremor  of both hands, moderate limb rigidity. Dyskinesias of the head arms and legs.  Coordination: finger-nose-finger,  no dysmetria Reflexes: Brachioradialis 2/2, biceps 2/2, triceps 2/2, patellar 2/2, Achilles  absent plantar responses were flexor bilaterally. Gait and Station: In  wheelchair , not ambulated  DIAGNOSTIC DATA (LABS, IMAGING, TESTING) - I reviewed patient records, labs, notes, testing and imaging myself where available.  Lab Results  Component Value Date   WBC 7.5 12/23/2013   HGB 8.9* 12/23/2013   HCT 29.3* 12/23/2013   MCV 68.5* 12/23/2013   PLT 252.0 12/23/2013      Component Value Date/Time   NA 143 11/09/2013 1240   NA 140 11/01/2013 1339   K 4.0 11/09/2013 1240   CL 104 11/09/2013 1240   CO2 26 11/09/2013 1240   GLUCOSE 85 11/09/2013 1240   GLUCOSE 89 11/01/2013 1339   BUN 15 11/09/2013 1240   BUN 16 11/01/2013 1339   CREATININE 0.69 11/09/2013 1240   CALCIUM 9.0 11/09/2013 1240   PROT 6.8 11/09/2013 1240   PROT 4.9* 09/29/2013 0923   ALBUMIN 3.5 11/09/2013 1240   AST 17 11/09/2013 1240   ALT <5 11/09/2013 1240   ALKPHOS 89 11/09/2013 1240   BILITOT 0.3 11/09/2013 1240   GFRNONAA 84* 11/09/2013 1240   GFRAA >90 11/09/2013 1240    Lab Results  Component Value Date   TSH 1.700 08/02/2013      ASSESSMENT AND PLAN  73 y.o. year old female  has a past medical history of Parkinson's disease; Hypertension; Arthritis; Anemia;  Chronic systolic heart failure; Acute on chronic systolic heart failure;  Here  to follow up for idiopathic Parkinson's disease.  Discussed with Dr. Krista Blue and Dr. Janann Colonel Continue carbidopa levodopa at the current dose Continue Mirapex at current dose Chair exercises if possible Followup in1 month with Dr. Janann Colonel for research trial, pt is interested due to dyskinesias Dennie Bible, [facility], [facility], APRN  [facility] [address], [place], [place] [address] [phone]

## 2014-01-28 NOTE — Patient Instructions (Addendum)
Continue carbidopa levodopa at the current dose Chair exercises and possible Followup in1 month with Dr. Janann Colonel for research trial

## 2014-01-31 ENCOUNTER — Ambulatory Visit: Payer: Medicare Other | Admitting: Internal Medicine

## 2014-02-10 DIAGNOSIS — H2510 Age-related nuclear cataract, unspecified eye: Secondary | ICD-10-CM | POA: Diagnosis not present

## 2014-02-10 DIAGNOSIS — H52229 Regular astigmatism, unspecified eye: Secondary | ICD-10-CM | POA: Diagnosis not present

## 2014-02-10 DIAGNOSIS — H01009 Unspecified blepharitis unspecified eye, unspecified eyelid: Secondary | ICD-10-CM | POA: Diagnosis not present

## 2014-02-10 DIAGNOSIS — H5210 Myopia, unspecified eye: Secondary | ICD-10-CM | POA: Diagnosis not present

## 2014-02-11 ENCOUNTER — Encounter: Payer: Self-pay | Admitting: Internal Medicine

## 2014-02-11 ENCOUNTER — Ambulatory Visit (INDEPENDENT_AMBULATORY_CARE_PROVIDER_SITE_OTHER): Payer: Medicare Other | Admitting: Internal Medicine

## 2014-02-11 VITALS — BP 128/70 | HR 72 | Temp 97.2°F | Resp 10 | Wt 173.0 lb

## 2014-02-11 DIAGNOSIS — G20A1 Parkinson's disease without dyskinesia, without mention of fluctuations: Secondary | ICD-10-CM | POA: Diagnosis not present

## 2014-02-11 DIAGNOSIS — K5900 Constipation, unspecified: Secondary | ICD-10-CM | POA: Diagnosis not present

## 2014-02-11 DIAGNOSIS — M542 Cervicalgia: Secondary | ICD-10-CM

## 2014-02-11 DIAGNOSIS — Z9889 Other specified postprocedural states: Secondary | ICD-10-CM | POA: Diagnosis not present

## 2014-02-11 DIAGNOSIS — E890 Postprocedural hypothyroidism: Secondary | ICD-10-CM

## 2014-02-11 DIAGNOSIS — G20 Parkinson's disease: Secondary | ICD-10-CM | POA: Diagnosis not present

## 2014-02-11 DIAGNOSIS — R269 Unspecified abnormalities of gait and mobility: Secondary | ICD-10-CM

## 2014-02-11 DIAGNOSIS — D509 Iron deficiency anemia, unspecified: Secondary | ICD-10-CM | POA: Diagnosis not present

## 2014-02-11 DIAGNOSIS — M4712 Other spondylosis with myelopathy, cervical region: Secondary | ICD-10-CM | POA: Diagnosis not present

## 2014-02-11 DIAGNOSIS — M4722 Other spondylosis with radiculopathy, cervical region: Secondary | ICD-10-CM

## 2014-02-11 DIAGNOSIS — Z9009 Acquired absence of other part of head and neck: Secondary | ICD-10-CM

## 2014-02-11 DIAGNOSIS — R2689 Other abnormalities of gait and mobility: Secondary | ICD-10-CM

## 2014-02-11 DIAGNOSIS — K5909 Other constipation: Secondary | ICD-10-CM

## 2014-02-11 MED ORDER — LINACLOTIDE 145 MCG PO CAPS: 145.0000 ug | ORAL_CAPSULE | Freq: Every day | ORAL | Status: DC

## 2014-02-11 MED ORDER — HYDROCODONE-ACETAMINOPHEN 5-325 MG PO TABS: 1.0000 | ORAL_TABLET | Freq: Three times a day (TID) | ORAL | Status: DC | PRN

## 2014-02-11 NOTE — Progress Notes (Signed)
Patient ID: [name], female   DOB: 1941-05-02, 73 y.o.   MRN: [id number]   Location:  [facility]  Code Status: DNR,MOST completed with patient today--she is unsure about her wishes about a feeding tube and wants to discuss this with Dr. Hilarie Fredrickson  Allergies  Allergen Reactions  . Codeine Other (See Comments)    unknown  . Penicillins Other (See Comments)    unknown    Chief Complaint  Patient presents with  . Medical Management of Chronic Issues    3 month follow-up   . Neck Pain    Patient c/o pain in neck that radiates into head x 1 month. Pain is so severe at times that it limits mobility   . Results    Discuss Genetic Testing Results     HPI: Patient is a 73 y.o. white female seen in the office today for medical mgt of chronic diseases.    She is c/o left-sided neck pain radiating to her head for one month.  Pain is so bad it prevents her from getting around at times.  Comes and goes.  Sharp pain, not shooting pain.  Not moving her neck helped.  Even putting her head on the pillow made it hurt on that side.    Did not take any of her hydrocodone she had from her back.    Is going to see Dr. Janann Colonel 6/8 due to her increasing periods of stiffness.  He is participating in a study that may help her.  Is having softer stools, but does not always completely evacuate--stool will move but not come all the way out, she says.  Dr Hilarie Fredrickson told her she had Lysbeth Galas erosions rather than a bleeding ulcer,she says.  She wants to talk to him about PEG tube use if she would need it in the future.  She has been out on her porch planting flowers in pots.  Review of Systems:  Review of Systems  Constitutional: Positive for malaise/fatigue. Negative for fever.       Also better lately  Eyes: Negative for blurred vision.  Respiratory: Positive for shortness of breath.        Improved lately  Cardiovascular: Positive for leg swelling. Negative for chest  pain.  Gastrointestinal: Positive for constipation. Negative for abdominal pain, blood in stool and melena.  Genitourinary: Negative for dysuria.  Musculoskeletal: Positive for neck pain. Negative for falls.  Skin: Negative for rash.  Neurological: Positive for tremors, weakness and headaches. Negative for dizziness, sensory change and loss of consciousness.       Freezing more often  Psychiatric/Behavioral: Positive for depression.     Past Medical History  Diagnosis Date  . Parkinson's disease   . Hypertension   . Arthritis   . Anemia   . Cellulitis of lower leg 12/24/2011  . Acute bronchitis   . Other abnormal blood chemistry   . Chronic systolic heart failure   . Acute on chronic systolic heart failure   . Cellulitis and abscess of leg, except foot   . Hypotension, unspecified   . Helicobacter pylori (H. pylori)   . Acute gastric ulcer with hemorrhage, without mention of obstruction     Past Surgical History  Procedure Laterality Date  . Hip arthroscopy w/ labral repair    . Thyroidectomy    . Esophagogastroduodenoscopy  09/26/2011    Procedure: ESOPHAGOGASTRODUODENOSCOPY (EGD);  Surgeon: Zenovia Jarred, MD;  Location: [facility] ENDOSCOPY;  Service: Gastroenterology;  Laterality: N/A;  To be done at bedside.  . Esophagogastroduodenoscopy N/A 09/30/2013    Procedure: ESOPHAGOGASTRODUODENOSCOPY (EGD);  Surgeon: [name], MD;  Location: [place];  Service: Endoscopy;  Laterality: N/A;  . Esophagogastroduodenoscopy N/A 11/25/2013    Procedure: ESOPHAGOGASTRODUODENOSCOPY (EGD);  Surgeon: [name], MD;  Location: [facility];  Service: Endoscopy;  Laterality: N/A;    Social History:   reports that she has never smoked. She has never used smokeless tobacco. She reports that she does not drink alcohol or use illicit drugs.  Family History  Problem Relation Age of Onset  . GER disease Mother   . Heart disease Father     Medications: Patient's Medications  New  Prescriptions   No medications on file  Previous Medications   ALBUTEROL (PROVENTIL) (2.5 MG/3ML) 0.083% NEBULIZER SOLUTION    Take 2.5 mg by nebulization every 6 (six) hours as needed. For shortness of breath   B COMPLEX VITAMINS TABLET    Take 1 tablet by mouth daily.     CARBIDOPA-LEVODOPA (SINEMET CR) 50-200 MG PER TABLET    Take 1 tablet by mouth at bedtime. 10pm   CARBIDOPA-LEVODOPA (SINEMET IR) 25-100 MG PER TABLET    Take 1.5 tablets by mouth 4 (four) times daily. 6am, 10am, 2pm and 6pm   FERROUS SULFATE (IRON) 325 (65 FE) MG TABS    Take 1 capsule by mouth daily.   FUROSEMIDE (LASIX) 20 MG TABLET    Take 20 mg by mouth 2 (two) times daily.   PANTOPRAZOLE (PROTONIX) 40 MG TABLET    Take 1 tablet (40 mg total) by mouth 2 (two) times daily.   PRAMIPEXOLE (MIRAPEX) 1.5 MG TABLET    Take 1 tablet (1.5 mg total) by mouth 3 (three) times daily.   TRIAMCINOLONE CREAM (KENALOG) 0.1 %    Apply 1 application topically daily as needed (rash). Applied to legs  Modified Medications   No medications on file  Discontinued Medications   SUCRALFATE (CARAFATE) 1 G TABLET    Take 1 tablet (1 g total) by mouth 4 (four) times daily -  with meals and at bedtime.   Physical Exam: Filed Vitals:   02/11/14 1042  BP: 128/70  Pulse: 72  Temp: 97.2 F (36.2 C)  TempSrc: Oral  Resp: 10  Weight: 173 lb (78.472 kg)  SpO2: 98%  Physical Exam  Constitutional: She is oriented to person, place, and time. No distress.  Chronically ill appearing white female   Cardiovascular: Normal rate, regular rhythm, normal heart sounds and intact distal pulses.   Pulmonary/Chest: Effort normal and breath sounds normal. No respiratory distress. She has no rales.  Abdominal: Soft. Bowel sounds are normal. She exhibits no distension and no mass. There is no tenderness.  Musculoskeletal:  Kyphoscoliosis;  Has tenderness of left paravertebral muscles of neck and palpable osteophytes;  Gets up slowly;  Uses rollator walker--no  freezing during this visit;  Resting tremors, some choreiform movements, but less than she has had previously  Neurological: She is alert and oriented to person, place, and time. She exhibits abnormal muscle tone.  Skin: There is erythema.  Chronic of lower extremities  Psychiatric: She has a normal mood and affect.    Labs reviewed: Basic Metabolic Panel:  Recent Labs  07/30/13 1004 08/02/13 1500  10/15/13 0800 11/01/13 1339 11/09/13 1240  NA 137  --   < > 141 140 143  K 4.6  --   < > 4.5 4.0 4.0  CL  104  --   < > 103 100 104  CO2 25  --   < > 27 24 26   GLUCOSE 97  --   < > 125* 89 85  BUN 13  --   < > 22 16 15   CREATININE 0.63  --   < > 1.06 0.76 0.69  CALCIUM 8.9  --   < > 8.6 8.8 9.0  TSH  --  1.700  --   --   --   --   < > = values in this interval not displayed. Liver Function Tests:  Recent Labs  09/30/13 0655 10/06/13 0606 11/09/13 1240  AST 12 12 17   ALT <5 <5 <5  ALKPHOS 45 62 89  BILITOT 0.7 0.5 0.3  PROT 4.4* 5.5* 6.8  ALBUMIN 2.3* 2.5* 3.5    Recent Labs  04/09/13 11/09/13 1240  LIPASE 18 20  AMYLASE 28*  --   CBC:  Recent Labs  11/09/13 1240 11/11/13 1452 11/22/13 1626 11/25/13 1320 12/23/13 1107  WBC 6.2 6.2 6.3 6.1 7.5  NEUTROABS 4.0 4.4 4.4  --   --   HGB 8.3* 7.9* 7.7 Repeated and verified X2.* 8.7* 8.9*  HCT 27.6* 26.6* 25.6 Repeated and verified X2.* 28.8* 29.3*  MCV 76.7* 76* 72.2* 72.7* 68.5*  PLT 213  --  226.0 189 252.0   Assessment/Plan 1. Parkinson's disease -causing #2 which has been worse lately -medication to be started by neuro when she sees Dr. Janann Colonel to help with this possibly--apparently she may be participating in a study  2. freezing of gait -due to #1  3. Anemia, iron deficiency - CBC With differential/Platelet -cont iron supplement -has hiatal hernia with cameron erosions  4. Chronic constipation - TSH checked--I seem to recall her taking synthroid, but not on now and hypothyroidism not in med list but  did have partial thyroidectomy - 12 pill sample given - Linaclotide (LINZESS) 145 MCG CAPS capsule; Take 1 capsule (145 mcg total) by mouth daily.  Dispense: 30 capsule; Refill: 3  5. Neck pain on left side - seems due to her arthritis of her neck with severe osteophytes and radiculopathy -HYDROcodone-acetaminophen (NORCO/VICODIN) 5-325 MG per tablet; Take 1 tablet by mouth 3 (three) times daily as needed for severe pain. For neck pain  Dispense: 90 tablet; Refill: 0 - Comprehensive metabolic panel  6. Osteoarthritis of spine with radiculopathy, cervical region - HYDROcodone-acetaminophen (NORCO/VICODIN) 5-325 MG per tablet; Take 1 tablet by mouth 3 (three) times daily as needed for severe pain. For neck pain  Dispense: 90 tablet; Refill: 0 - Comprehensive metabolic panel  7. H/O partial thyroidectomy - TSH due to severe constipation though I suspect this is primarily due to her PD  Labs/tests ordered:   Orders Placed This Encounter  Procedures  . CBC With differential/Platelet  . Comprehensive metabolic panel  . TSH    Next appt:  ~6 weeks

## 2014-02-11 NOTE — Patient Instructions (Signed)
You may use your hydrocodone pills one tablet up to three times per day for your neck pain when you have it.

## 2014-02-12 LAB — CBC WITH DIFFERENTIAL
Basophils Absolute: 0.1 10*3/uL (ref 0.0–0.2)
Basos: 1 %
Eos: 2 %
Eosinophils Absolute: 0.2 10*3/uL (ref 0.0–0.4)
HCT: 37.7 % (ref 34.0–46.6)
Hemoglobin: 12 g/dL (ref 11.1–15.9)
Immature Grans (Abs): 0 10*3/uL (ref 0.0–0.1)
Immature Granulocytes: 0 %
Lymphocytes Absolute: 0.9 10*3/uL (ref 0.7–3.1)
Lymphs: 12 %
MCH: 23.3 pg — ABNORMAL LOW (ref 26.6–33.0)
MCHC: 31.8 g/dL (ref 31.5–35.7)
MCV: 73 fL — ABNORMAL LOW (ref 79–97)
Monocytes Absolute: 0.6 10*3/uL (ref 0.1–0.9)
Monocytes: 8 %
Neutrophils Absolute: 6.1 10*3/uL (ref 1.4–7.0)
Neutrophils Relative %: 77 %
Platelets: 145 10*3/uL — ABNORMAL LOW (ref 150–379)
RBC: 5.16 x10E6/uL (ref 3.77–5.28)
RDW: 27.3 % — ABNORMAL HIGH (ref 12.3–15.4)
WBC: 7.9 10*3/uL (ref 3.4–10.8)

## 2014-02-12 LAB — COMPREHENSIVE METABOLIC PANEL
ALT: 4 IU/L (ref 0–32)
AST: 20 IU/L (ref 0–40)
Albumin/Globulin Ratio: 1.6 (ref 1.1–2.5)
Albumin: 3.9 g/dL (ref 3.5–4.8)
Alkaline Phosphatase: 80 IU/L (ref 39–117)
BUN/Creatinine Ratio: 19 (ref 11–26)
BUN: 15 mg/dL (ref 8–27)
CO2: 23 mmol/L (ref 18–29)
Calcium: 9.2 mg/dL (ref 8.7–10.3)
Chloride: 102 mmol/L (ref 97–108)
Creatinine, Ser: 0.77 mg/dL (ref 0.57–1.00)
GFR calc Af Amer: 89 mL/min/{1.73_m2} (ref >59)
GFR calc non Af Amer: 77 mL/min/{1.73_m2} (ref >59)
Globulin, Total: 2.4 g/dL (ref 1.5–4.5)
Glucose: 97 mg/dL (ref 65–99)
Potassium: 4.3 mmol/L (ref 3.5–5.2)
Sodium: 142 mmol/L (ref 134–144)
Total Bilirubin: 0.3 mg/dL (ref 0.0–1.2)
Total Protein: 6.3 g/dL (ref 6.0–8.5)

## 2014-02-12 LAB — TSH: TSH: 0.907 u[IU]/mL (ref 0.450–4.500)

## 2014-02-28 ENCOUNTER — Encounter: Payer: Self-pay | Admitting: Neurology

## 2014-02-28 ENCOUNTER — Ambulatory Visit (INDEPENDENT_AMBULATORY_CARE_PROVIDER_SITE_OTHER): Payer: Medicare Other | Admitting: Neurology

## 2014-02-28 VITALS — BP 156/92 | HR 95

## 2014-02-28 DIAGNOSIS — R269 Unspecified abnormalities of gait and mobility: Secondary | ICD-10-CM

## 2014-02-28 DIAGNOSIS — G20 Parkinson's disease: Secondary | ICD-10-CM | POA: Diagnosis not present

## 2014-02-28 NOTE — Progress Notes (Signed)
[facility]  PATIENT: [name] DOB: [birth year]   REASON FOR VISIT: follow up for parkinsons disease   HISTORY OF PRESENT ILLNESS: [name], 73 year old female returns for followup. She has a history of Parkinson's disease was last seen by Cecille Rubin on 01/28/2014. Sent for follow up and possible inclusion in an upcoming trial for dyskinesias. She notes some "jerking episodes" throughout the day, this can occur at peak dose. Involves her whole body. Causes difficulty with ADLs and is socially embarrassing. No falls since last visit. No other changes at this time.     HISTORY: She had a history of Parkinson's disease since 1999, also has past medical history of hypertension, reported history of stroke. Initial symptoms were finger stiffness, difficulty manipulating, gait change, decreased arm swing, and also right hemisensory loss. But over the years, she has developed typical parkinsonian features, including resting tremor, bradykinesia, rigidity, and gait difficulty.  She has odd sleeping habit, goes to bed 6:00 every evening, wakes up around 2:00 in the morning, takes a nap about 10 AM.  She has history of low back, left buttock area pain, shooting along left posterior thigh and leg, severe in 04/2011, x-ray and later MRI -lumbar has demonstrated T12 compression fracture, , most severe at L2-3, L3-4. She had lumbar brace and later Kyphoplasty by Dr. Dietrich Pates in 07/13/2009, overall, back pain is better, but still there, also has left lateral thigh/leg numbness and pain. She denies incontinence.She sees Dr. Nelva Bush for pain management.  Bone density scan c/w osteopenia, on vitD and calcium supplement. Recent sleep study by PCP confirms OSA but pt says insurance does not cover CPAP. Reports independent in ADL's, no longer driving. Gets mobile meals during the week.  She was admitted to hospital in May 2013, suffered GI bleeding and chest pain then, more tremor before the next  dose of sinemet. she has increased gait difficulty, pain playing a major role here, she has urinary frequency, urgency, because she is taking Lasix, she lives by herself, 2 of her 5 brothers is actively involved in helping her, She uses public transportations.  Since last visit was in 08/11/12: She was changed to Sinemet 25/100 one and 1/2 tabs qid at 6, 10, 14, 18 pm, plus night time sinemet CR 50/200, also taking Mirapex 1.5mg  3 tabs tid, Her low back pain has much improved, she could not afford Azilect. She noticed some jerking movement during peak dose of Sinemet.    REVIEW OF SYSTEMS: Full 14 system review of systems performed and notable only for those listed, all others are neg:  Constitutional: N/A  Cardiovascular: N/A  Ear/Nose/Throat: N/A  Skin: N/A  Eyes: N/A  Respiratory: Shortness of breath  Gastroitestinal: N/A  Hematology/Lymphatic: N/A  Endocrine: N/A Musculoskeletal back pain Allergy/Immunology: N/A  Neurological: Tremors Psychiatric: N/A Sleep : NA   ALLERGIES: Allergies  Allergen Reactions  . Codeine Other (See Comments)    unknown  . Penicillins Other (See Comments)    unknown    HOME MEDICATIONS: Outpatient Prescriptions Prior to Visit  Medication Sig Dispense Refill  . albuterol (PROVENTIL) (2.5 MG/3ML) 0.083% nebulizer solution Take 2.5 mg by nebulization every 6 (six) hours as needed. For shortness of breath      . b complex vitamins tablet Take 1 tablet by mouth daily.        . carbidopa-levodopa (SINEMET CR) 50-200 MG per tablet Take 1 tablet by mouth at bedtime. 10pm  30 tablet  12  . carbidopa-levodopa (SINEMET IR)  25-100 MG per tablet Take 1.5 tablets by mouth 4 (four) times daily. 6am, 10am, 2pm and 6pm  180 tablet  6  . Ferrous Sulfate (IRON) 325 (65 FE) MG TABS Take 1 capsule by mouth daily.  30 each  6  . furosemide (LASIX) 20 MG tablet Take 20 mg by mouth 2 (two) times daily.      Marland Kitchen HYDROcodone-acetaminophen (NORCO/VICODIN) 5-325 MG per tablet  Take 1 tablet by mouth 3 (three) times daily as needed for severe pain. For neck pain  90 tablet  0  . Linaclotide (LINZESS) 145 MCG CAPS capsule Take 1 capsule (145 mcg total) by mouth daily.  30 capsule  3  . Linaclotide (LINZESS) 145 MCG CAPS capsule Take 1 capsule (145 mcg total) by mouth daily.  30 capsule  3  . pantoprazole (PROTONIX) 40 MG tablet Take 1 tablet (40 mg total) by mouth 2 (two) times daily.  60 tablet  6  . pramipexole (MIRAPEX) 1.5 MG tablet Take 1 tablet (1.5 mg total) by mouth 3 (three) times daily.  90 tablet  6  . triamcinolone cream (KENALOG) 0.1 % Apply 1 application topically daily as needed (rash). Applied to legs       Facility-Administered Medications Prior to Visit  Medication Dose Route Frequency Provider Last Rate Last Dose  . ipratropium-albuterol (DUONEB) 0.5-2.5 (3) MG/3ML nebulizer solution 3 mL  3 mL Nebulization Q4H Gayland Curry, DO        PAST MEDICAL HISTORY: Past Medical History  Diagnosis Date  . Parkinson's disease   . Hypertension   . Arthritis   . Anemia   . Cellulitis of lower leg 12/24/2011  . Acute bronchitis   . Other abnormal blood chemistry   . Chronic systolic heart failure   . Acute on chronic systolic heart failure   . Cellulitis and abscess of leg, except foot   . Hypotension, unspecified   . Helicobacter pylori (H. pylori)   . Acute gastric ulcer with hemorrhage, without mention of obstruction     PAST SURGICAL HISTORY: Past Surgical History  Procedure Laterality Date  . Hip arthroscopy w/ labral repair    . Thyroidectomy    . Esophagogastroduodenoscopy  09/26/2011    Procedure: ESOPHAGOGASTRODUODENOSCOPY (EGD);  Surgeon: Zenovia Jarred, MD;  Location: [facility] ENDOSCOPY;  Service: Gastroenterology;  Laterality: N/A;  To be done at bedside.  . Esophagogastroduodenoscopy N/A 09/30/2013    Procedure: ESOPHAGOGASTRODUODENOSCOPY (EGD);  Surgeon: [name], MD;  Location: [place];  Service: Endoscopy;  Laterality: N/A;  .  Esophagogastroduodenoscopy N/A 11/25/2013    Procedure: ESOPHAGOGASTRODUODENOSCOPY (EGD);  Surgeon: [name], MD;  Location: [facility] END[facility];  Service: Endoscopy;  Laterality: N/A;    FAMILY HISTORY: Family History  Problem Relation Age of Onset  . GER disease Mother   . Heart disease Father     SOCIAL HISTORY: History   Social History  . Marital Status: Single    Spouse Name: N/A    Number of Children: 0  . Years of Education: 12   Occupational History  .      Retired   Social History Main Topics  . Smoking status: Never Smoker   . Smokeless tobacco: Never Used  . Alcohol Use: No  . Drug Use: No  . Sexual Activity: No   Other Topics Concern  . Not on file   Social History Narrative   Patient lives at home alone. Patient is retired. Patient has high school education.   Caffeine-  one daily.   Right handed.     PHYSICAL EXAM  Filed Vitals:   02/28/14 1053  BP: 156/92  Pulse: 95   There is no weight on file to calculate BMI.  Generalized: Well developed, in no acute distress  Head: normocephalic and atraumatic,. Oropharynx benign  Neck: Supple, no carotid bruits  Cardiac: Regular rate rhythm, no murmur  Respiratory few wheezes Musculoskeletal: No deformity   Neurological examination   Mentation: Alert oriented to time, place, history taking. MMSE 27/30 missing  items in calculation. AFT 9. Follows all commands speech and language fluent  Cranial nerve II-XII: Pupils were equal round reactive to light extraocular movements were full, visual field were full on confrontational test. Facial sensation and strength were normal. hearing was intact to finger rubbing bilaterally. Uvula tongue midline. head turning and shoulder shrug were normal and symmetric.Tongue protrusion into cheek strength was normal. Motor: normal bulk and tone, full strength in the BUE, BLE, resting tremor  of both hands, moderate limb rigidity. Dyskinesias of the head arms and legs.    Coordination: finger-nose-finger,  no dysmetria Reflexes: Brachioradialis 2/2, biceps 2/2, triceps 2/2, patellar 2/2, Achilles absent plantar responses were flexor bilaterally. Gait and Station: In  wheelchair , not ambulated  DIAGNOSTIC DATA (LABS, IMAGING, TESTING) - I reviewed patient records, labs, notes, testing and imaging myself where available.  Lab Results  Component Value Date   WBC 7.9 02/11/2014   HGB 12.0 02/11/2014   HCT 37.7 02/11/2014   MCV 73* 02/11/2014   PLT 145* 02/11/2014      Component Value Date/Time   NA 142 02/11/2014 1202   NA 143 11/09/2013 1240   K 4.3 02/11/2014 1202   CL 102 02/11/2014 1202   CO2 23 02/11/2014 1202   GLUCOSE 97 02/11/2014 1202   GLUCOSE 85 11/09/2013 1240   BUN 15 02/11/2014 1202   BUN 15 11/09/2013 1240   CREATININE 0.77 02/11/2014 1202   CALCIUM 9.2 02/11/2014 1202   PROT 6.3 02/11/2014 1202   PROT 6.8 11/09/2013 1240   ALBUMIN 3.5 11/09/2013 1240   AST 20 02/11/2014 1202   ALT 4 02/11/2014 1202   ALKPHOS 80 02/11/2014 1202   BILITOT 0.3 02/11/2014 1202   GFRNONAA 77 02/11/2014 1202   GFRAA 89 02/11/2014 1202    Lab Results  Component Value Date   TSH 0.907 02/11/2014      ASSESSMENT AND PLAN  73 y.o. year old female  has a past medical history of Parkinson's disease; Hypertension; Arthritis; Anemia;  Chronic systolic heart failure; Acute on chronic systolic heart failure;  Here  to follow up for idiopathic Parkinson's disease.   No dyskinesias noted on exam today but based on description she appears to be suffering from peak dose dyskinesias which are negatively impacting her quality of life. She appears to be a good candidate for the ER amantadine clinical trial. Will contact her to arrange for a screening visit in the next few weeks.  Continue carbidopa levodopa at the current dose Continue Mirapex at current dose Chair exercises if possible   [facility] [address] [place], [place] [address] [phone]

## 2014-03-07 ENCOUNTER — Encounter: Payer: Self-pay | Admitting: Internal Medicine

## 2014-03-24 ENCOUNTER — Telehealth: Payer: Self-pay

## 2014-03-24 ENCOUNTER — Other Ambulatory Visit (INDEPENDENT_AMBULATORY_CARE_PROVIDER_SITE_OTHER): Payer: Medicare Other

## 2014-03-24 DIAGNOSIS — D649 Anemia, unspecified: Secondary | ICD-10-CM

## 2014-03-24 LAB — IBC PANEL
Iron: 63 ug/dL (ref 42–145)
Saturation Ratios: 18.2 % — ABNORMAL LOW (ref 20.0–50.0)
Transferrin: 246.7 mg/dL (ref 212.0–360.0)

## 2014-03-24 LAB — CBC
HEMATOCRIT: 37 % (ref 36.0–46.0)
Hemoglobin: 12.3 g/dL (ref 12.0–15.0)
MCHC: 33.2 g/dL (ref 30.0–36.0)
MCV: 79.4 fl (ref 78.0–100.0)
Platelets: 161 10*3/uL (ref 150.0–400.0)
RBC: 4.65 Mil/uL (ref 3.87–5.11)
RDW: 22.6 % — AB (ref 11.5–15.5)
WBC: 7.8 10*3/uL (ref 4.0–10.5)

## 2014-03-24 LAB — FERRITIN: FERRITIN: 16.4 ng/mL (ref 10.0–291.0)

## 2014-03-24 NOTE — Telephone Encounter (Signed)
Message copied by Greggory Keen on Thu Mar 24, 2014  1:23 PM ------      Message from: Marlon Pel      Created: Tue Dec 28, 2013  9:29 AM       Needs labs in July ------

## 2014-03-24 NOTE — Telephone Encounter (Signed)
Order indicates the labs have been drawn as of 12:43 today

## 2014-03-28 ENCOUNTER — Telehealth: Payer: Self-pay | Admitting: Nurse Practitioner

## 2014-03-28 NOTE — Telephone Encounter (Signed)
Waiting to hear back about a parkinsons study that she was told about in June. Would like to know if they still have it.

## 2014-03-28 NOTE — Telephone Encounter (Signed)
Message copied by Greggory Keen on Mon Mar 28, 2014  2:07 PM ------      Message from: Jerene Bears      Created: Mon Mar 28, 2014 12:01 PM       Hemoglobin has improved, iron studies better but still borderline low, continue daily oral iron supplementation and daily PPI ------

## 2014-03-28 NOTE — Telephone Encounter (Signed)
Patient was advised that the study has not started yet but the research department does have her information and they will give her a call once they start.  Patient verbalized understanding.

## 2014-03-28 NOTE — Telephone Encounter (Signed)
Patient advised.

## 2014-04-18 ENCOUNTER — Encounter: Payer: Self-pay | Admitting: Internal Medicine

## 2014-04-18 ENCOUNTER — Ambulatory Visit (INDEPENDENT_AMBULATORY_CARE_PROVIDER_SITE_OTHER): Payer: Medicare Other | Admitting: Internal Medicine

## 2014-04-18 VITALS — BP 136/80 | HR 100 | Temp 97.2°F | Resp 12 | Wt 176.0 lb

## 2014-04-18 DIAGNOSIS — K5909 Other constipation: Secondary | ICD-10-CM

## 2014-04-18 DIAGNOSIS — G20A1 Parkinson's disease without dyskinesia, without mention of fluctuations: Secondary | ICD-10-CM | POA: Diagnosis not present

## 2014-04-18 DIAGNOSIS — G20 Parkinson's disease: Secondary | ICD-10-CM | POA: Diagnosis not present

## 2014-04-18 DIAGNOSIS — R269 Unspecified abnormalities of gait and mobility: Secondary | ICD-10-CM | POA: Diagnosis not present

## 2014-04-18 DIAGNOSIS — K449 Diaphragmatic hernia without obstruction or gangrene: Secondary | ICD-10-CM | POA: Diagnosis not present

## 2014-04-18 DIAGNOSIS — D509 Iron deficiency anemia, unspecified: Secondary | ICD-10-CM | POA: Diagnosis not present

## 2014-04-18 DIAGNOSIS — K5900 Constipation, unspecified: Secondary | ICD-10-CM

## 2014-04-18 DIAGNOSIS — R2689 Other abnormalities of gait and mobility: Secondary | ICD-10-CM

## 2014-04-18 DIAGNOSIS — M47812 Spondylosis without myelopathy or radiculopathy, cervical region: Secondary | ICD-10-CM

## 2014-04-18 DIAGNOSIS — G629 Polyneuropathy, unspecified: Secondary | ICD-10-CM

## 2014-04-18 DIAGNOSIS — G609 Hereditary and idiopathic neuropathy, unspecified: Secondary | ICD-10-CM

## 2014-04-18 DIAGNOSIS — M4722 Other spondylosis with radiculopathy, cervical region: Secondary | ICD-10-CM

## 2014-04-18 MED ORDER — GABAPENTIN 100 MG PO CAPS: 100.0000 mg | ORAL_CAPSULE | Freq: Three times a day (TID) | ORAL | Status: DC

## 2014-04-18 MED ORDER — POLYETHYLENE GLYCOL 3350 17 GM/SCOOP PO POWD: 17.0000 g | Freq: Two times a day (BID) | ORAL | Status: DC | PRN

## 2014-04-18 MED ORDER — SENNA 8.6 MG PO TABS: 2.0000 | ORAL_TABLET | Freq: Every day | ORAL | Status: DC

## 2014-04-18 NOTE — Progress Notes (Signed)
Patient ID: [name], female   DOB: 01/24/1941, 73 y.o.   MRN: [id number]   Location:  [facility]  Code Status: DNR   Allergies  Allergen Reactions  . Codeine Other (See Comments)    unknown  . Penicillins Other (See Comments)    unknown    Chief Complaint  Patient presents with  . Medical Management of Chronic Issues    9 week follow-up   . Leg Problem    Re-examine legs- redness and tenderness   . Stiffness    Patient c/o stiffness -? related to Parkinson     HPI: Patient is a 73 y.o. white female seen in the office today for med mgt chronic diseases  Had burning, pain, redness in thighs.  Comes and goes.  Gets worse when lays down.  Burns more in lower legs, then comes up into thighs.  Feet do tingle on the bottoms.  Has been very stiff lately to where she can hardly move--worse in legs, but does also affect arms sometimes. They wanted her at neurology to joint a study, but she has not heard anymore about that.  Chart reveals that the study has not yet officially started and I informed her about that.    Says that the pharmacist would not fill her linzess--she said he sent me a letter that said I had to write to the company to get it filled (?prior authorization).  $350.  Is not on the insurance's list.  It did not work by day 4 for her anyway when I gave her samples.  Has traditional medicare.   Senna s and miralax not working very well.  Stool softener helps a little, but not much.  Was borderline with iron levels.  Was released from them.  Had cameron erosions on hiatal hernia not ulcer this time with her latest GI bleed.  Review of Systems:  Review of Systems  Constitutional: Positive for malaise/fatigue. Negative for fever.  HENT: Negative for congestion.   Eyes: Negative for blurred vision.  Respiratory: Negative for shortness of breath.   Cardiovascular: Positive for leg swelling. Negative for chest pain and  palpitations.  Gastrointestinal: Negative for constipation, blood in stool and melena.       Recent GI bleed from cameron erosions and hiatal hernia  Genitourinary: Negative for dysuria.  Skin: Negative for rash.  Neurological: Positive for tremors, sensory change and weakness. Negative for loss of consciousness and headaches.  Psychiatric/Behavioral: Positive for depression. Negative for memory loss.    Past Medical History  Diagnosis Date  . Parkinson's disease   . Hypertension   . Arthritis   . Anemia   . Cellulitis of lower leg 12/24/2011  . Acute bronchitis   . Other abnormal blood chemistry   . Chronic systolic heart failure   . Acute on chronic systolic heart failure   . Cellulitis and abscess of leg, except foot   . Hypotension, unspecified   . Helicobacter pylori (H. pylori)   . Acute gastric ulcer with hemorrhage, without mention of obstruction     Past Surgical History  Procedure Laterality Date  . Hip arthroscopy w/ labral repair    . Thyroidectomy    . Esophagogastroduodenoscopy  09/26/2011    Procedure: ESOPHAGOGASTRODUODENOSCOPY (EGD);  Surgeon: Zenovia Jarred, MD;  [facility] ENDOSCOPY;  Service: Gastroenterology;  Laterality: N/A;  To be done at bedside.  . Esophagogastroduodenoscopy N/A 09/30/2013    Procedure: ESOPHAGOGASTRODUODENOSCOPY (EGD);  Surgeon: Quillian Quince  Merrily Brittle, MD;  Location: [facility] ENDOSCOPY;  Service: Endoscopy;  Laterality: N/A;  . Esophagogastroduodenoscopy N/A 11/25/2013    Procedure: ESOPHAGOGASTRODUODENOSCOPY (EGD);  Surgeon: [name], MD;  Location: [facility] ENDOSCOPY;  Service: Endoscopy;  Laterality: N/A;    Social History:   reports that she has never smoked. She has never used smokeless tobacco. She reports that she does not drink alcohol or use illicit drugs.  Family History  Problem Relation Age of Onset  . GER disease Mother   . Heart disease Father     Medications: Patient's Medications  New Prescriptions   No medications on file    Previous Medications   ALBUTEROL (PROVENTIL) (2.5 MG/3ML) 0.083% NEBULIZER SOLUTION    Take 2.5 mg by nebulization every 6 (six) hours as needed. For shortness of breath   B COMPLEX VITAMINS TABLET    Take 1 tablet by mouth daily.     CARBIDOPA-LEVODOPA (SINEMET CR) 50-200 MG PER TABLET    Take 1 tablet by mouth at bedtime. 10pm   CARBIDOPA-LEVODOPA (SINEMET IR) 25-100 MG PER TABLET    Take 1.5 tablets by mouth 4 (four) times daily. 6am, 10am, 2pm and 6pm   FERROUS SULFATE (IRON) 325 (65 FE) MG TABS    Take 1 capsule by mouth daily.   FUROSEMIDE (LASIX) 20 MG TABLET    Take 20 mg by mouth 2 (two) times daily.   HYDROCODONE-ACETAMINOPHEN (NORCO/VICODIN) 5-325 MG PER TABLET    Take 1 tablet by mouth 3 (three) times daily as needed for severe pain. For neck pain   PANTOPRAZOLE (PROTONIX) 40 MG TABLET    Take 1 tablet (40 mg total) by mouth 2 (two) times daily.   PRAMIPEXOLE (MIRAPEX) 1.5 MG TABLET    Take 1 tablet (1.5 mg total) by mouth 3 (three) times daily.   TRIAMCINOLONE CREAM (KENALOG) 0.1 %    Apply 1 application topically daily as needed (rash). Applied to legs  Modified Medications   No medications on file  Discontinued Medications   LINACLOTIDE (LINZESS) 145 MCG CAPS CAPSULE    Take 1 capsule (145 mcg total) by mouth daily.   LINACLOTIDE (LINZESS) 145 MCG CAPS CAPSULE    Take 1 capsule (145 mcg total) by mouth daily.     Physical Exam: Filed Vitals:   04/18/14 1548  BP: 136/80  Pulse: 100  Temp: 97.2 F (36.2 C)  TempSrc: Oral  Resp: 12  Weight: 176 lb (79.833 kg)  SpO2: 95%  Physical Exam  Constitutional: She is oriented to person, place, and time. No distress.  Cardiovascular:  irreg irreg  Pulmonary/Chest: Effort normal and breath sounds normal. No respiratory distress. She has no wheezes. She has no rales.  Abdominal: Soft. Bowel sounds are normal. She exhibits no distension and no mass. There is no tenderness.  Musculoskeletal:  cogwheeling rigidity and tremors;  less akathisia than she had been having past couple of visits  Neurological: She is alert and oriented to person, place, and time.  Skin: Skin is warm and dry.  Chronic erythema of bilateral LEs  Psychiatric: She has a normal mood and affect.     Labs reviewed: Basic Metabolic Panel:  Recent Labs  07/30/13 1004 08/02/13 1500  11/01/13 1339 11/09/13 1240 02/11/14 1202  NA 137  --   < > 140 143 142  K 4.6  --   < > 4.0 4.0 4.3  CL 104  --   < > 100 104 102  CO2 25  --   < >  24 26 23   GLUCOSE 97  --   < > 89 85 97  BUN 13  --   < > 16 15 15   CREATININE 0.63  --   < > 0.76 0.69 0.77  CALCIUM 8.9  --   < > 8.8 9.0 9.2  TSH  --  1.700  --   --   --  0.907  < > = values in this interval not displayed. Liver Function Tests:  Recent Labs  09/30/13 0655 10/06/13 0606 11/09/13 1240 02/11/14 1202  AST 12 12 17 20   ALT <5 <5 <5 4  ALKPHOS 45 62 89 80  BILITOT 0.7 0.5 0.3 0.3  PROT 4.4* 5.5* 6.8 6.3  ALBUMIN 2.3* 2.5* 3.5  --     Recent Labs  11/09/13 1240  LIPASE 20   No results found for this basename: AMMONIA,  in the last 8760 hours CBC:  Recent Labs  11/11/13 1452 11/22/13 1626  12/23/13 1107 02/11/14 1202 03/24/14 1243  WBC 6.2 6.3  < > 7.5 7.9 7.8  NEUTROABS 4.4 4.4  --   --  6.1  --   HGB 7.9* 7.7 Repeated and verified X2.*  < > 8.9* 12.0 12.3  HCT 26.6* 25.6 Repeated and verified X2.*  < > 29.3* 37.7 37.0  MCV 76* 72.2*  < > 68.5* 73* 79.4  PLT  --  226.0  < > 252.0 145* 161.0  < > = values in this interval not displayed.   Assessment/Plan 1. Peripheral neuropathy -  on gabapentin for this, causes some drowsiness at higher doses - check B12 and Folate Panel  2. Parkinson's disease -cont sinemet, mirapex, f/u with Dr. Rexene Alberts and also with the research trial she is planning to participate in  3. Primary freezing of gait -due to her Parkinson's, this is recently worse  4. Anemia, iron deficiency -cont daily iron supplement, if takes more often,  her constipation gets to be severe  5. Chronic constipation -cont senna s 1-2 tabs for mild constipation and miralax 2x per day prn constipation;  Tried on linzess, b ut still having difficulty moving bowels  6. Hiatal hernia -cont to avoid nsaids that will trigger bleeding of erosions on her hernia -she is not a surgical candidate  7. Osteoarthritis of spine with radiculopathy, cervical region -recently more painful with some muscle spasms -has done well with low dose hydrocodone as tylenol has not been sufficient  Labs/tests ordered:   Orders Placed This Encounter  Procedures  . B12 and Folate Panel    Next appt:  3 mos

## 2014-04-19 LAB — B12 AND FOLATE PANEL
Folate: 18.2 ng/mL (ref >3.0)
Vitamin B-12: 458 pg/mL (ref 211–946)

## 2014-05-20 ENCOUNTER — Encounter: Payer: Self-pay | Admitting: Neurology

## 2014-05-27 ENCOUNTER — Encounter: Payer: Self-pay | Admitting: Neurology

## 2014-06-08 ENCOUNTER — Telehealth: Payer: Self-pay | Admitting: Nurse Practitioner

## 2014-06-08 NOTE — Telephone Encounter (Signed)
Patient requesting Rx refill for pramipexole (MIRAPEX) 1.5 MG tablet sent [facility].  Please call anytime and may leave detailed message if not available.

## 2014-06-08 NOTE — Telephone Encounter (Signed)
Patient called and stated [facility] has Rx for Mirapex on file.

## 2014-06-23 ENCOUNTER — Telehealth: Payer: Self-pay | Admitting: *Deleted

## 2014-06-23 NOTE — Telephone Encounter (Signed)
Patient called the office and left message asking [place] to return her call.  She has recently been released from rehab

## 2014-07-21 ENCOUNTER — Ambulatory Visit (INDEPENDENT_AMBULATORY_CARE_PROVIDER_SITE_OTHER): Payer: Medicare Other | Admitting: Internal Medicine

## 2014-07-21 ENCOUNTER — Encounter: Payer: Self-pay | Admitting: Internal Medicine

## 2014-07-21 VITALS — BP 140/70 | HR 98 | Temp 97.9°F | Ht 64.0 in | Wt 178.0 lb

## 2014-07-21 DIAGNOSIS — K5900 Constipation, unspecified: Secondary | ICD-10-CM | POA: Diagnosis not present

## 2014-07-21 DIAGNOSIS — Z23 Encounter for immunization: Secondary | ICD-10-CM | POA: Diagnosis not present

## 2014-07-21 DIAGNOSIS — M4722 Other spondylosis with radiculopathy, cervical region: Secondary | ICD-10-CM

## 2014-07-21 DIAGNOSIS — K5909 Other constipation: Secondary | ICD-10-CM

## 2014-07-21 DIAGNOSIS — I5042 Chronic combined systolic (congestive) and diastolic (congestive) heart failure: Secondary | ICD-10-CM

## 2014-07-21 DIAGNOSIS — R2689 Other abnormalities of gait and mobility: Secondary | ICD-10-CM

## 2014-07-21 DIAGNOSIS — M542 Cervicalgia: Secondary | ICD-10-CM

## 2014-07-21 DIAGNOSIS — M545 Low back pain, unspecified: Secondary | ICD-10-CM

## 2014-07-21 DIAGNOSIS — G20 Parkinson's disease: Secondary | ICD-10-CM

## 2014-07-21 DIAGNOSIS — D509 Iron deficiency anemia, unspecified: Secondary | ICD-10-CM

## 2014-07-21 DIAGNOSIS — G20A1 Parkinson's disease without dyskinesia, without mention of fluctuations: Secondary | ICD-10-CM

## 2014-07-21 NOTE — Progress Notes (Signed)
Patient ID: [name], female   DOB: 1941/01/10, 73 y.o.   MRN: [id number]   Location:  [facility]  Code Status: DNR   Allergies  Allergen Reactions  . Codeine Other (See Comments)    unknown  . Penicillins Other (See Comments)    unknown    Chief Complaint  Patient presents with  . Medical Management of Chronic Issues    3 month follow up    HPI: Patient is a 73 y.o. white female seen in the office today for medical mgt of chronic diseases.  Went to [facility] neurological appt yesterday about study Study to start if "passed physical" Could not walk today--legs gave out Sometimes freezes 2-3x per day May end up getting a new form of amantadine  Had a weak spell with a cold and has been weak since.  Happened a couple of mos ago.    Is feeling hot.  No coughing these days.  Does have chronic phlegm daily.    Discussed importance of flu shot.  Nephew's wife is coming to help her Brother each night Mobile meals  Denies hematochezia or melena.   Breathing well these days.  Has a phone mate now with Parkinson's also.  They call each other each day.    Bowels still don't move daily but about the same she says.  sometimes doesn't know they moved.    Low back pain sometimes when up standing and has to sit all of a sudden for relief.  Gets her hydrocodone from Dr. Joselyn Arrow pain contract with him.    Review of Systems:  Review of Systems  Constitutional: Positive for malaise/fatigue. Negative for fever and chills.  HENT: Positive for congestion.   Eyes: Negative for blurred vision.  Respiratory: Negative for shortness of breath.   Cardiovascular: Negative for chest pain and leg swelling.  Gastrointestinal: Positive for constipation. Negative for abdominal pain, blood in stool and melena.  Genitourinary: Negative for dysuria, urgency and frequency.  Musculoskeletal: Positive for back pain and falls.  Skin: Negative for rash.    Neurological: Positive for weakness. Negative for dizziness.  Endo/Heme/Allergies: Does not bruise/bleed easily.  Psychiatric/Behavioral: Negative for depression and memory loss.     Past Medical History  Diagnosis Date  . Parkinson's disease   . Hypertension   . Arthritis   . Anemia   . Cellulitis of lower leg 12/24/2011  . Acute bronchitis   . Other abnormal blood chemistry   . Chronic systolic heart failure   . Acute on chronic systolic heart failure   . Cellulitis and abscess of leg, except foot   . Hypotension, unspecified   . Helicobacter pylori (H. pylori)   . Acute gastric ulcer with hemorrhage, without mention of obstruction     Past Surgical History  Procedure Laterality Date  . Hip arthroscopy w/ labral repair    . Thyroidectomy    . Esophagogastroduodenoscopy  09/26/2011    Procedure: ESOPHAGOGASTRODUODENOSCOPY (EGD);  Surgeon: Zenovia Jarred, MD;  Location: [facility] ENDOSCOPY;  Service: Gastroenterology;  Laterality: N/A;  To be done at bedside.  . Esophagogastroduodenoscopy N/A 09/30/2013    Procedure: ESOPHAGOGASTRODUODENOSCOPY (EGD);  Surgeon: [name], MD;  Location: [place];  Service: Endoscopy;  Laterality: N/A;  . Esophagogastroduodenoscopy N/A 11/25/2013    Procedure: ESOPHAGOGASTRODUODENOSCOPY (EGD);  Surgeon: [name], MD;  Location: [facility] ENDOSCOPY;  Service: Endoscopy;  Laterality: N/A;    Social History:   reports that she has never  smoked. She has never used smokeless tobacco. She reports that she does not drink alcohol or use illicit drugs.  Family History  Problem Relation Age of Onset  . GER disease Mother   . Heart disease Father     Medications: Patient's Medications  New Prescriptions   No medications on file  Previous Medications   CARBIDOPA-LEVODOPA (SINEMET CR) 50-200 MG PER TABLET    Take 1 tablet by mouth at bedtime. 10pm   CARBIDOPA-LEVODOPA (SINEMET IR) 25-100 MG PER TABLET    Take 1.5 tablets by mouth 4 (four) times daily.  6am, 10am, 2pm and 6pm   FERROUS SULFATE (IRON) 325 (65 FE) MG TABS    Take 1 capsule by mouth daily.   FUROSEMIDE (LASIX) 20 MG TABLET    Take 20 mg by mouth 2 (two) times daily.   HYDROCODONE-ACETAMINOPHEN (NORCO/VICODIN) 5-325 MG PER TABLET    Take 1 tablet by mouth 3 (three) times daily as needed for severe pain. For neck pain   PANTOPRAZOLE (PROTONIX) 40 MG TABLET    Take 1 tablet (40 mg total) by mouth 2 (two) times daily.   POLYETHYLENE GLYCOL POWDER (GLYCOLAX/MIRALAX) POWDER    Take 17 g by mouth 2 (two) times daily as needed.   PRAMIPEXOLE (MIRAPEX) 1.5 MG TABLET    Take 1 tablet (1.5 mg total) by mouth 3 (three) times daily.   SENNA (SENOKOT) 8.6 MG TABS TABLET    Take 2 tablets (17.2 mg total) by mouth daily.  Modified Medications   No medications on file  Discontinued Medications   ALBUTEROL (PROVENTIL) (2.5 MG/3ML) 0.083% NEBULIZER SOLUTION    Take 2.5 mg by nebulization every 6 (six) hours as needed. For shortness of breath   B COMPLEX VITAMINS TABLET    Take 1 tablet by mouth daily.     GABAPENTIN (NEURONTIN) 100 MG CAPSULE    Take 1 capsule (100 mg total) by mouth 3 (three) times daily. For burning pain in legs   TRIAMCINOLONE CREAM (KENALOG) 0.1 %    Apply 1 application topically daily as needed (rash). Applied to legs     Physical Exam: Filed Vitals:   07/21/14 1312  BP: 140/70  Pulse: 98  Temp: 97.9 F (36.6 C)  TempSrc: Oral  Height: 5\' 4"  (1.626 m)  Weight: 178 lb (80.74 kg)  SpO2: 97%  Physical Exam  Constitutional: She is oriented to person, place, and time. She appears well-developed and well-nourished.  Cardiovascular: Normal rate, regular rhythm, normal heart sounds and intact distal pulses.   Pulmonary/Chest: Effort normal and breath sounds normal.  Abdominal: Soft. Bowel sounds are normal. She exhibits no distension and no mass. There is no tenderness.  Musculoskeletal: Normal range of motion.  Neurological: She is alert and oriented to person, place,  and time. She exhibits abnormal muscle tone. Coordination abnormal.  Skin: Skin is warm and dry.  Psychiatric: She has a normal mood and affect.    Labs reviewed: Basic Metabolic Panel:  Recent Labs  07/30/13 1004 08/02/13 1500  11/01/13 1339 11/09/13 1240 02/11/14 1202  NA 137  --   < > 140 143 142  K 4.6  --   < > 4.0 4.0 4.3  CL 104  --   < > 100 104 102  CO2 25  --   < > 24 26 23   GLUCOSE 97  --   < > 89 85 97  BUN 13  --   < > 16 15 15   CREATININE 0.63  --   < >  0.76 0.69 0.77  CALCIUM 8.9  --   < > 8.8 9.0 9.2  TSH  --  1.700  --   --   --  0.907  < > = values in this interval not displayed. Liver Function Tests:  Recent Labs  09/30/13 0655 10/06/13 0606 11/09/13 1240 02/11/14 1202  AST 12 12 17 20   ALT <5 <5 <5 4  ALKPHOS 45 62 89 80  BILITOT 0.7 0.5 0.3 0.3  PROT 4.4* 5.5* 6.8 6.3  ALBUMIN 2.3* 2.5* 3.5  --     Recent Labs  11/09/13 1240  LIPASE 20   No results found for this basename: AMMONIA,  in the last 8760 hours CBC:  Recent Labs  11/11/13 1452 11/22/13 1626  12/23/13 1107 02/11/14 1202 03/24/14 1243  WBC 6.2 6.3  < > 7.5 7.9 7.8  NEUTROABS 4.4 4.4  --   --  6.1  --   HGB 7.9* 7.7 Repeated and verified X2.*  < > 8.9* 12.0 12.3  HCT 26.6* 25.6 Repeated and verified X2.*  < > 29.3* 37.7 37.0  MCV 76* 72.2*  < > 68.5* 73* 79.4  PLT  --  226.0  < > 252.0 145* 161.0  < > = values in this interval not displayed.  Assessment/Plan 1. Parkinson's disease -progressing with more freezing all of the time -cont current sinemet regimen -keep f/u with the neurology study  2. Primary freezing of gait -hopefully pt can start on treatment arm of the study and get some benefit  3. Chronic constipation -cont current regimen, due to PD  4. Anemia, iron deficiency -f/u labs - CBC With differential/Platelet - Comprehensive metabolic panel  5. Chronic combined systolic and diastolic congestive heart failure -stable lately with lasix, check cmp  for k  6. Midline low back pain without sciatica -keep f/u with Dr. Nelva Bush where she gets her hdyrocodone  7. Neck pain on left side -stable  8. Osteoarthritis of spine with radiculopathy, cervical region -stable  Labs/tests ordered: Orders Placed This Encounter  Procedures  . CBC With differential/Platelet  . Comprehensive metabolic panel    Next appt:  3 mos with prevnar  Thaison Kolodziejski L. Kenyana Husak, D.O. [facility] [address], [place] [address] Cell Phone (Mon-Fri 8am-5pm):  [phone] On Call:  [phone] & follow prompts after 5pm & weekends Office Phone:  [phone] Office Fax:  [phone]

## 2014-07-22 ENCOUNTER — Encounter: Payer: Self-pay | Admitting: *Deleted

## 2014-07-22 LAB — CBC WITH DIFFERENTIAL
Basophils Absolute: 0 10*3/uL (ref 0.0–0.2)
Basos: 1 %
Eos: 2 %
Eosinophils Absolute: 0.1 10*3/uL (ref 0.0–0.4)
HCT: 36.8 % (ref 34.0–46.6)
Hemoglobin: 11.9 g/dL (ref 11.1–15.9)
Immature Grans (Abs): 0 10*3/uL (ref 0.0–0.1)
Immature Granulocytes: 0 %
Lymphocytes Absolute: 1 10*3/uL (ref 0.7–3.1)
Lymphs: 12 %
MCH: 27.8 pg (ref 26.6–33.0)
MCHC: 32.3 g/dL (ref 31.5–35.7)
MCV: 86 fL (ref 79–97)
Monocytes Absolute: 0.6 10*3/uL (ref 0.1–0.9)
Monocytes: 7 %
Neutrophils Absolute: 6.4 10*3/uL (ref 1.4–7.0)
Neutrophils Relative %: 78 %
Platelets: 159 10*3/uL (ref 150–379)
RBC: 4.28 x10E6/uL (ref 3.77–5.28)
RDW: 15.5 % — ABNORMAL HIGH (ref 12.3–15.4)
WBC: 8.2 10*3/uL (ref 3.4–10.8)

## 2014-07-22 LAB — COMPREHENSIVE METABOLIC PANEL
ALT: 9 IU/L (ref 0–32)
AST: 16 IU/L (ref 0–40)
Albumin/Globulin Ratio: 1.6 (ref 1.1–2.5)
Albumin: 3.9 g/dL (ref 3.5–4.8)
Alkaline Phosphatase: 73 IU/L (ref 39–117)
BUN/Creatinine Ratio: 15 (ref 11–26)
BUN: 21 mg/dL (ref 8–27)
CO2: 21 mmol/L (ref 18–29)
Calcium: 8.9 mg/dL (ref 8.7–10.3)
Chloride: 105 mmol/L (ref 97–108)
Creatinine, Ser: 1.36 mg/dL — ABNORMAL HIGH (ref 0.57–1.00)
GFR calc Af Amer: 44 mL/min/{1.73_m2} — ABNORMAL LOW (ref >59)
GFR calc non Af Amer: 38 mL/min/{1.73_m2} — ABNORMAL LOW (ref >59)
Globulin, Total: 2.5 g/dL (ref 1.5–4.5)
Glucose: 97 mg/dL (ref 65–99)
Potassium: 4.4 mmol/L (ref 3.5–5.2)
Sodium: 143 mmol/L (ref 134–144)
Total Bilirubin: 0.5 mg/dL (ref 0.0–1.2)
Total Protein: 6.4 g/dL (ref 6.0–8.5)

## 2014-08-06 ENCOUNTER — Inpatient Hospital Stay (HOSPITAL_COMMUNITY)
Admission: EM | Admit: 2014-08-06 | Discharge: 2014-08-10 | DRG: 392 | Disposition: A | Discharge status: Home / Self Care | Payer: Medicare Other | Attending: Internal Medicine | Admitting: Internal Medicine

## 2014-08-06 DIAGNOSIS — I5189 Other ill-defined heart diseases: Secondary | ICD-10-CM | POA: Diagnosis present

## 2014-08-06 DIAGNOSIS — K449 Diaphragmatic hernia without obstruction or gangrene: Secondary | ICD-10-CM | POA: Diagnosis present

## 2014-08-06 DIAGNOSIS — D509 Iron deficiency anemia, unspecified: Secondary | ICD-10-CM | POA: Diagnosis present

## 2014-08-06 DIAGNOSIS — K529 Noninfective gastroenteritis and colitis, unspecified: Principal | ICD-10-CM | POA: Diagnosis present

## 2014-08-06 DIAGNOSIS — R1032 Left lower quadrant pain: Secondary | ICD-10-CM

## 2014-08-06 DIAGNOSIS — K5900 Constipation, unspecified: Secondary | ICD-10-CM | POA: Diagnosis present

## 2014-08-06 DIAGNOSIS — G20 Parkinson's disease: Secondary | ICD-10-CM | POA: Diagnosis present

## 2014-08-06 DIAGNOSIS — I10 Essential (primary) hypertension: Secondary | ICD-10-CM | POA: Diagnosis present

## 2014-08-06 DIAGNOSIS — R197 Diarrhea, unspecified: Secondary | ICD-10-CM | POA: Diagnosis not present

## 2014-08-06 DIAGNOSIS — I2781 Cor pulmonale (chronic): Secondary | ICD-10-CM | POA: Diagnosis present

## 2014-08-06 DIAGNOSIS — N2889 Other specified disorders of kidney and ureter: Secondary | ICD-10-CM | POA: Diagnosis present

## 2014-08-06 DIAGNOSIS — R0600 Dyspnea, unspecified: Secondary | ICD-10-CM

## 2014-08-06 DIAGNOSIS — R269 Unspecified abnormalities of gait and mobility: Secondary | ICD-10-CM | POA: Diagnosis present

## 2014-08-06 DIAGNOSIS — E785 Hyperlipidemia, unspecified: Secondary | ICD-10-CM | POA: Diagnosis present

## 2014-08-06 DIAGNOSIS — M1990 Unspecified osteoarthritis, unspecified site: Secondary | ICD-10-CM | POA: Diagnosis present

## 2014-08-06 DIAGNOSIS — I5022 Chronic systolic (congestive) heart failure: Secondary | ICD-10-CM | POA: Diagnosis present

## 2014-08-06 DIAGNOSIS — Z8673 Personal history of transient ischemic attack (TIA), and cerebral infarction without residual deficits: Secondary | ICD-10-CM

## 2014-08-06 DIAGNOSIS — Z880 Allergy status to penicillin: Secondary | ICD-10-CM | POA: Diagnosis not present

## 2014-08-06 DIAGNOSIS — R112 Nausea with vomiting, unspecified: Secondary | ICD-10-CM

## 2014-08-06 DIAGNOSIS — K2970 Gastritis, unspecified, without bleeding: Secondary | ICD-10-CM | POA: Diagnosis not present

## 2014-08-06 DIAGNOSIS — K429 Umbilical hernia without obstruction or gangrene: Secondary | ICD-10-CM | POA: Diagnosis not present

## 2014-08-06 DIAGNOSIS — I519 Heart disease, unspecified: Secondary | ICD-10-CM | POA: Diagnosis not present

## 2014-08-06 DIAGNOSIS — R110 Nausea: Secondary | ICD-10-CM | POA: Diagnosis not present

## 2014-08-06 DIAGNOSIS — Z885 Allergy status to narcotic agent status: Secondary | ICD-10-CM | POA: Diagnosis not present

## 2014-08-06 NOTE — ED Notes (Addendum)
Per EMS: pt coming from home with c/o LLQ abdominal pain since 2200. Pt has hx of chronic constipation. Pt does take narcotic pain medications. Pt was given 4 mg Zofran IV en route. Pt A&Ox4, respirations equal and unlabored, skin warm and dry

## 2014-08-07 ENCOUNTER — Encounter (HOSPITAL_COMMUNITY): Payer: Self-pay | Admitting: Radiology

## 2014-08-07 ENCOUNTER — Emergency Department (HOSPITAL_COMMUNITY): Payer: Medicare Other

## 2014-08-07 DIAGNOSIS — K5900 Constipation, unspecified: Secondary | ICD-10-CM | POA: Diagnosis present

## 2014-08-07 DIAGNOSIS — E785 Hyperlipidemia, unspecified: Secondary | ICD-10-CM | POA: Diagnosis present

## 2014-08-07 DIAGNOSIS — K449 Diaphragmatic hernia without obstruction or gangrene: Secondary | ICD-10-CM | POA: Diagnosis not present

## 2014-08-07 DIAGNOSIS — R197 Diarrhea, unspecified: Secondary | ICD-10-CM

## 2014-08-07 DIAGNOSIS — N2889 Other specified disorders of kidney and ureter: Secondary | ICD-10-CM | POA: Diagnosis not present

## 2014-08-07 DIAGNOSIS — I5022 Chronic systolic (congestive) heart failure: Secondary | ICD-10-CM | POA: Diagnosis present

## 2014-08-07 DIAGNOSIS — I10 Essential (primary) hypertension: Secondary | ICD-10-CM | POA: Diagnosis not present

## 2014-08-07 DIAGNOSIS — R1032 Left lower quadrant pain: Secondary | ICD-10-CM | POA: Diagnosis not present

## 2014-08-07 DIAGNOSIS — I2781 Cor pulmonale (chronic): Secondary | ICD-10-CM | POA: Diagnosis present

## 2014-08-07 DIAGNOSIS — D509 Iron deficiency anemia, unspecified: Secondary | ICD-10-CM | POA: Diagnosis not present

## 2014-08-07 DIAGNOSIS — Z885 Allergy status to narcotic agent status: Secondary | ICD-10-CM | POA: Diagnosis not present

## 2014-08-07 DIAGNOSIS — K529 Noninfective gastroenteritis and colitis, unspecified: Secondary | ICD-10-CM | POA: Diagnosis not present

## 2014-08-07 DIAGNOSIS — M1990 Unspecified osteoarthritis, unspecified site: Secondary | ICD-10-CM | POA: Diagnosis present

## 2014-08-07 DIAGNOSIS — I519 Heart disease, unspecified: Secondary | ICD-10-CM | POA: Diagnosis not present

## 2014-08-07 DIAGNOSIS — R269 Unspecified abnormalities of gait and mobility: Secondary | ICD-10-CM | POA: Diagnosis not present

## 2014-08-07 DIAGNOSIS — Z880 Allergy status to penicillin: Secondary | ICD-10-CM | POA: Diagnosis not present

## 2014-08-07 DIAGNOSIS — K429 Umbilical hernia without obstruction or gangrene: Secondary | ICD-10-CM | POA: Diagnosis not present

## 2014-08-07 DIAGNOSIS — R112 Nausea with vomiting, unspecified: Secondary | ICD-10-CM | POA: Diagnosis not present

## 2014-08-07 DIAGNOSIS — Z8673 Personal history of transient ischemic attack (TIA), and cerebral infarction without residual deficits: Secondary | ICD-10-CM | POA: Diagnosis not present

## 2014-08-07 DIAGNOSIS — G20 Parkinson's disease: Secondary | ICD-10-CM | POA: Diagnosis not present

## 2014-08-07 LAB — CBC WITH DIFFERENTIAL/PLATELET
Basophils Absolute: 0 10*3/uL (ref 0.0–0.1)
Basophils Relative: 0 % (ref 0–1)
EOS ABS: 0.1 10*3/uL (ref 0.0–0.7)
Eosinophils Relative: 1 % (ref 0–5)
HEMATOCRIT: 40.3 % (ref 36.0–46.0)
Hemoglobin: 12.6 g/dL (ref 12.0–15.0)
Lymphocytes Relative: 5 % — ABNORMAL LOW (ref 12–46)
Lymphs Abs: 0.7 10*3/uL (ref 0.7–4.0)
MCH: 27.2 pg (ref 26.0–34.0)
MCHC: 31.3 g/dL (ref 30.0–36.0)
MCV: 87 fL (ref 78.0–100.0)
MONOS PCT: 3 % (ref 3–12)
Monocytes Absolute: 0.4 10*3/uL (ref 0.1–1.0)
NEUTROS ABS: 12.3 10*3/uL — AB (ref 1.7–7.7)
Neutrophils Relative %: 91 % — ABNORMAL HIGH (ref 43–77)
Platelets: 163 10*3/uL (ref 150–400)
RBC: 4.63 MIL/uL (ref 3.87–5.11)
RDW: 15.3 % (ref 11.5–15.5)
WBC: 13.5 10*3/uL — ABNORMAL HIGH (ref 4.0–10.5)

## 2014-08-07 LAB — COMPREHENSIVE METABOLIC PANEL
ALT: 6 U/L (ref 0–35)
ANION GAP: 13 (ref 5–15)
AST: 17 U/L (ref 0–37)
Albumin: 3.2 g/dL — ABNORMAL LOW (ref 3.5–5.2)
Alkaline Phosphatase: 71 U/L (ref 39–117)
BILIRUBIN TOTAL: 0.5 mg/dL (ref 0.3–1.2)
BUN: 20 mg/dL (ref 6–23)
CHLORIDE: 104 meq/L (ref 96–112)
CO2: 23 mEq/L (ref 19–32)
CREATININE: 0.75 mg/dL (ref 0.50–1.10)
Calcium: 8.4 mg/dL (ref 8.4–10.5)
GFR calc Af Amer: >90 mL/min (ref >90)
GFR, EST NON AFRICAN AMERICAN: 81 mL/min — AB (ref >90)
Glucose, Bld: 136 mg/dL — ABNORMAL HIGH (ref 70–99)
POTASSIUM: 4.2 meq/L (ref 3.7–5.3)
Sodium: 140 mEq/L (ref 137–147)
Total Protein: 6.1 g/dL (ref 6.0–8.3)

## 2014-08-07 LAB — URINALYSIS, ROUTINE W REFLEX MICROSCOPIC
Bilirubin Urine: NEGATIVE
GLUCOSE, UA: NEGATIVE mg/dL
HGB URINE DIPSTICK: NEGATIVE
Ketones, ur: 15 mg/dL — AB
Leukocytes, UA: NEGATIVE
Nitrite: NEGATIVE
PH: 5 (ref 5.0–8.0)
Protein, ur: NEGATIVE mg/dL
SPECIFIC GRAVITY, URINE: 1.037 — AB (ref 1.005–1.030)
Urobilinogen, UA: 0.2 mg/dL (ref 0.0–1.0)

## 2014-08-07 LAB — I-STAT CG4 LACTIC ACID, ED: LACTIC ACID, VENOUS: 0.79 mmol/L (ref 0.5–2.2)

## 2014-08-07 MED ORDER — ONDANSETRON 4 MG PO TBDP: 8.0000 mg | ORAL_TABLET | Freq: Once | ORAL | Status: DC

## 2014-08-07 MED ORDER — HEPARIN SODIUM (PORCINE) 5000 UNIT/ML IJ SOLN
5000.0000 [IU] | Freq: Three times a day (TID) | INTRAMUSCULAR | Status: DC
  Administered 2014-08-07 – 2014-08-10 (×9): 5000 [IU] via SUBCUTANEOUS
  Filled 2014-08-07 (×12): qty 1

## 2014-08-07 MED ORDER — ONDANSETRON HCL 4 MG/2ML IJ SOLN
4.0000 mg | Freq: Four times a day (QID) | INTRAMUSCULAR | Status: DC | PRN
  Administered 2014-08-09: 4 mg via INTRAVENOUS
  Filled 2014-08-07: qty 2

## 2014-08-07 MED ORDER — IOHEXOL 300 MG/ML  SOLN
100.0000 mL | Freq: Once | INTRAMUSCULAR | Status: AC | PRN
  Administered 2014-08-07: 100 mL via INTRAVENOUS

## 2014-08-07 MED ORDER — SODIUM CHLORIDE 0.9 % IJ SOLN
3.0000 mL | Freq: Two times a day (BID) | INTRAMUSCULAR | Status: DC
  Administered 2014-08-07 – 2014-08-09 (×2): 3 mL via INTRAVENOUS

## 2014-08-07 MED ORDER — ONDANSETRON HCL 4 MG PO TABS
4.0000 mg | ORAL_TABLET | Freq: Four times a day (QID) | ORAL | Status: DC | PRN
  Filled 2014-08-07: qty 1

## 2014-08-07 MED ORDER — IOHEXOL 300 MG/ML  SOLN
25.0000 mL | Freq: Once | INTRAMUSCULAR | Status: AC | PRN
  Administered 2014-08-07: 25 mL via ORAL

## 2014-08-07 MED ORDER — PRAMIPEXOLE DIHYDROCHLORIDE 1.5 MG PO TABS
1.5000 mg | ORAL_TABLET | Freq: Three times a day (TID) | ORAL | Status: DC
  Administered 2014-08-07 – 2014-08-10 (×10): 1.5 mg via ORAL
  Filled 2014-08-07 (×13): qty 1

## 2014-08-07 MED ORDER — SODIUM CHLORIDE 0.9 % IV SOLN: Freq: Once | INTRAVENOUS | Status: DC

## 2014-08-07 MED ORDER — HYDROCODONE-ACETAMINOPHEN 5-325 MG PO TABS
1.0000 | ORAL_TABLET | Freq: Three times a day (TID) | ORAL | Status: DC | PRN
  Administered 2014-08-07 – 2014-08-09 (×2): 1 via ORAL
  Filled 2014-08-07 (×3): qty 1

## 2014-08-07 MED ORDER — SODIUM CHLORIDE 0.9 % IV BOLUS (SEPSIS)
1000.0000 mL | Freq: Once | INTRAVENOUS | Status: AC
  Administered 2014-08-07: 1000 mL via INTRAVENOUS

## 2014-08-07 MED ORDER — SODIUM CHLORIDE 0.9 % IV SOLN
INTRAVENOUS | Status: DC
  Administered 2014-08-07: 125 mL/h via INTRAVENOUS
  Administered 2014-08-07 – 2014-08-08 (×2): via INTRAVENOUS

## 2014-08-07 MED ORDER — CARBIDOPA-LEVODOPA 25-100 MG PO TABS
1.5000 | ORAL_TABLET | Freq: Four times a day (QID) | ORAL | Status: DC
  Administered 2014-08-07 – 2014-08-10 (×14): 1.5 via ORAL
  Filled 2014-08-07 (×20): qty 1.5

## 2014-08-07 MED ORDER — ONDANSETRON HCL 4 MG/2ML IJ SOLN
4.0000 mg | Freq: Once | INTRAMUSCULAR | Status: AC
  Administered 2014-08-07: 4 mg via INTRAVENOUS
  Filled 2014-08-07: qty 2

## 2014-08-07 MED ORDER — IPRATROPIUM-ALBUTEROL 0.5-2.5 (3) MG/3ML IN SOLN
3.0000 mL | RESPIRATORY_TRACT | Status: DC
  Administered 2014-08-07: 3 mL via RESPIRATORY_TRACT
  Filled 2014-08-07: qty 3

## 2014-08-07 MED ORDER — FERROUS SULFATE 325 (65 FE) MG PO TABS
325.0000 mg | ORAL_TABLET | Freq: Every day | ORAL | Status: DC
  Administered 2014-08-07 – 2014-08-10 (×4): 325 mg via ORAL
  Filled 2014-08-07 (×6): qty 1

## 2014-08-07 MED ORDER — IPRATROPIUM-ALBUTEROL 0.5-2.5 (3) MG/3ML IN SOLN
3.0000 mL | RESPIRATORY_TRACT | Status: DC | PRN
  Administered 2014-08-07: 3 mL via RESPIRATORY_TRACT
  Filled 2014-08-07: qty 3

## 2014-08-07 MED ORDER — LOPERAMIDE HCL 2 MG PO CAPS
4.0000 mg | ORAL_CAPSULE | Freq: Once | ORAL | Status: AC
  Administered 2014-08-07: 4 mg via ORAL
  Filled 2014-08-07: qty 2

## 2014-08-07 NOTE — ED Provider Notes (Signed)
CSN: [id number]     Arrival date & time 08/06/14  2329 History   First MD Initiated Contact with Patient 08/07/14 0225     Chief Complaint  Patient presents with  . Abdominal Pain     (Consider location/radiation/quality/duration/timing/severity/associated sxs/prior Treatment) HPI 72 year old female presents to emergency department from [facility] with complaint of left lower quadrant pain.  Pain started around 10 PM tonight.  She reports she started feeling queasy and unwell around dinnertime, did not eat dinner and instead went to bed.  She awoke with the pain.  She denies any fevers or chills.  Since being here in the emergency room she has had multiple loose stools as well as nausea and vomiting.  She denies any sick contacts, no recent antibiotics no change in her medications.  She reports the pain in her abdomen is much better, but cannot control the nausea vomiting and diarrhea. Past Medical History  Diagnosis Date  . Parkinson's disease   . Hypertension   . Arthritis   . Anemia   . Cellulitis of lower leg 12/24/2011  . Acute bronchitis   . Other abnormal blood chemistry   . Chronic systolic heart failure   . Acute on chronic systolic heart failure   . Cellulitis and abscess of leg, except foot   . Hypotension, unspecified   . Helicobacter pylori (H. pylori)   . Acute gastric ulcer with hemorrhage, without mention of obstruction    Past Surgical History  Procedure Laterality Date  . Hip arthroscopy w/ labral repair    . Thyroidectomy    . Esophagogastroduodenoscopy  09/26/2011    Procedure: ESOPHAGOGASTRODUODENOSCOPY (EGD);  Surgeon: Zenovia Jarred, MD;  Location: [facility] ENDOSCOPY;  Service: Gastroenterology;  Laterality: N/A;  To be done at bedside.  . Esophagogastroduodenoscopy N/A 09/30/2013    Procedure: ESOPHAGOGASTRODUODENOSCOPY (EGD);  Surgeon: [name], MD;  Location: [facility];  Service: Endoscopy;  Laterality: N/A;  . Esophagogastroduodenoscopy N/A 11/25/2013     Procedure: ESOPHAGOGASTRODUODENOSCOPY (EGD);  Surgeon: [name], MD;  Location: [facility];  Service: Endoscopy;  Laterality: N/A;   Family History  Problem Relation Age of Onset  . GER disease Mother   . Heart disease Father    History  Substance Use Topics  . Smoking status: Never Smoker   . Smokeless tobacco: Never Used  . Alcohol Use: No   OB History    No data available     Review of Systems  All other systems reviewed and are negative.     Allergies  Codeine and Penicillins  Home Medications   Prior to Admission medications   Medication Sig Start Date End Date Taking? Authorizing Provider  carbidopa-levodopa (SINEMET CR) 50-200 MG per tablet Take 1 tablet by mouth at bedtime. 10pm 01/28/14  Yes Dennie Bible, NP  carbidopa-levodopa (SINEMET IR) 25-100 MG per tablet Take 1.5 tablets by mouth 4 (four) times daily. 6am, 10am, 2pm and 6pm 01/28/14  Yes Dennie Bible, NP  furosemide (LASIX) 20 MG tablet Take 20 mg by mouth 2 (two) times daily as needed for fluid.    Yes Historical Provider, MD  HYDROcodone-acetaminophen (NORCO/VICODIN) 5-325 MG per tablet Take 1 tablet by mouth 3 (three) times daily as needed for severe pain. For neck pain 02/11/14  Yes [name] L Reed, DO  Multiple Vitamins-Minerals (MULTIVITAMIN WITH MINERALS) tablet Take 1 tablet by mouth daily.   Yes Historical Provider, MD  pantoprazole (PROTONIX) 40 MG tablet Take 1 tablet (40 mg total) by  mouth 2 (two) times daily. 12/28/13  Yes Jerene Bears, MD  polyethylene glycol powder (GLYCOLAX/MIRALAX) powder Take 17 g by mouth 2 (two) times daily as needed. Patient taking differently: Take 17 g by mouth 2 (two) times daily as needed for mild constipation.  04/18/14  Yes [name] L Reed, DO  pramipexole (MIRAPEX) 1.5 MG tablet Take 1 tablet (1.5 mg total) by mouth 3 (three) times daily. 01/28/14  Yes Dennie Bible, NP  senna (SENOKOT) 8.6 MG TABS tablet Take 2 tablets (17.2 mg total) by mouth  daily. Patient taking differently: Take 1-2 tablets by mouth daily as needed for mild constipation.  04/18/14  Yes [name] L Reed, DO  Ferrous Sulfate (IRON) 325 (65 FE) MG TABS Take 1 capsule by mouth daily. Patient not taking: Reported on 08/07/2014 12/23/13   Jerene Bears, MD   BP 126/52 mmHg  Pulse 88  Temp(Src) 98.4 F (36.9 C) (Oral)  Resp 14  Ht 5\' 4"  (1.626 m)  Wt 181 lb 3.2 oz (82.192 kg)  BMI 31.09 kg/m2  SpO2 97% Physical Exam  Constitutional: She is oriented to person, place, and time. She appears well-developed and well-nourished.  HENT:  Head: Normocephalic and atraumatic.  Nose: Nose normal.  Mouth/Throat: Oropharynx is clear and moist.  Eyes: Conjunctivae and EOM are normal. Pupils are equal, round, and reactive to light.  Neck: Normal range of motion. Neck supple. No JVD present. No tracheal deviation present. No thyromegaly present.  Cardiovascular: Normal rate, regular rhythm, normal heart sounds and intact distal pulses.  Exam reveals no gallop and no friction rub.   No murmur heard. Pulmonary/Chest: Effort normal and breath sounds normal. No stridor. No respiratory distress. She has no wheezes. She has no rales. She exhibits no tenderness.  Abdominal: Soft. She exhibits no distension and no mass. There is no tenderness. There is no rebound and no guarding.  Hyperactive bowel sounds  Musculoskeletal: Normal range of motion. She exhibits no edema or tenderness.  Lymphadenopathy:    She has no cervical adenopathy.  Neurological: She is alert and oriented to person, place, and time. She displays normal reflexes. She exhibits normal muscle tone. Coordination normal.  Skin: Skin is warm and dry. No rash noted. No erythema. No pallor.  Psychiatric: She has a normal mood and affect. Her behavior is normal. Judgment and thought content normal.  Nursing note and vitals reviewed.   ED Course  Procedures (including critical care time) Labs Review Labs Reviewed   COMPREHENSIVE METABOLIC PANEL - Abnormal; Notable for the following:    Glucose, Bld 136 (*)    Albumin 3.2 (*)    GFR calc non Af Amer 81 (*)    All other components within normal limits  CBC WITH DIFFERENTIAL - Abnormal; Notable for the following:    WBC 13.5 (*)    Neutrophils Relative % 91 (*)    Neutro Abs 12.3 (*)    Lymphocytes Relative 5 (*)    All other components within normal limits  URINALYSIS, ROUTINE W REFLEX MICROSCOPIC - Abnormal; Notable for the following:    APPearance CLOUDY (*)    Specific Gravity, Urine 1.037 (*)    Ketones, ur 15 (*)    All other components within normal limits  GI PATHOGEN PANEL BY PCR, STOOL  CBC  CREATININE, SERUM  I-STAT CG4 LACTIC ACID, ED    Imaging Review Ct Abdomen Pelvis W Contrast  08/07/2014   CLINICAL DATA:  Left lower quadrant pain  EXAM: CT  ABDOMEN AND PELVIS WITH CONTRAST  TECHNIQUE: Multidetector CT imaging of the abdomen and pelvis was performed using the standard protocol following bolus administration of intravenous contrast.  CONTRAST:  114mL OMNIPAQUE IOHEXOL 300 MG/ML  SOLN  COMPARISON:  None.  FINDINGS: The lung bases are clear.  There is a 2.9 cm hypodense, fluid attenuating left hepatic mass most consistent with a hemangioma. There is a bilobed hypodense right hepatic lobe mass likely representing a cyst. There is no intrahepatic or extrahepatic biliary ductal dilatation. The gallbladder is normal. The spleen demonstrates no focal abnormality.There is a 13 mm left renal hypodense mass most consistent with a cyst. There is a indeterminate 12 mm left inferior pole renal mass. There is a 14 mm hypodense, fluid attenuating right upper pole renal mass most consistent with a cyst. The kidneys, adrenal glands and pancreas are normal. The bladder is unremarkable.  There is a large hiatal hernia. There is a fat containing umbilical hernia. The stomach, duodenum, small intestine, and large intestine demonstrate no contrast extravasation  or dilatation. There is no pneumoperitoneum, pneumatosis, or portal venous gas. There is no abdominal or pelvic free fluid. There is no lymphadenopathy.  The abdominal aorta is normal in caliber with atherosclerosis.  There are no lytic or sclerotic osseous lesions. Total right hip arthroplasty. There is degenerative disc disease most severe at L2-3 and L3-4. There is a T12 vertebral body compression fracture with augmentation.  IMPRESSION: 1. No acute abdominal or pelvic pathology. No findings to explain the patient's left lower quadrant pain. 2. There is an indeterminate 12 mm left inferior pole renal mass. Further characterization with MRI of the kidneys is recommended.   Electronically Signed   By: Kathreen Devoid   On: 08/07/2014 05:36     EKG Interpretation None      MDM   Final diagnoses:  LLQ abdominal pain  Nausea vomiting and diarrhea    73 year old female with several hours of nausea vomiting and diarrhea.  She has elevated white blood cell count.  CT scan obtained, no acute findings.  Patient has had persistent nausea vomiting diarrhea despite being medicated.  Discussed with hospitalist who will admit.    Kalman Drape, MD 08/07/14 [phone]

## 2014-08-07 NOTE — ED Notes (Signed)
Patient has had 2 more incidents of stool incontinents.

## 2014-08-07 NOTE — ED Notes (Signed)
Patient cleaned for large amount of stool.  Patient has been vomiting dark brown emesis.

## 2014-08-07 NOTE — Consult Note (Signed)
[facility] History and Physical  [name] OQH:[id number] DOB: 03-Jun-1941 DOA: 08/06/2014  Referring physician: ED PCP: Hollace Kinnier, DO  Specialists: none  Chief Complaint: N/v/Diarr  HPI: 15 ? h/o Parkinson's since 1999, H/o large hiatal hernia on Endoscopy 11/25/13, htn, prior CVA, H/o UGIB in 2013 2/2 to Gastric ulcer, Diastolic HF + Cor pulmonale, syncope, prior R Fem neck #, thyroidectomy-for a nodule, dyslipidemia. Around suppertime 11/14 started having nausea.  This was between 6 and 7.  She didn't eat and went to sleep.  awoke with pain in abdomen  In LLQ.  Felt like a constant sharp trype pain.  Had diarrhea as well.  Unclear if bloody.  Has not been taking any laxatives lately-only occasionally take a stool softener. No fever.  No cp, no SOB, 1 episode vomit here in ed when taking in contrast.  Mildly dizzy.  No abx recently.  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 Parkinson's disease   . Hypertension   . Arthritis   . Anemia   . Cellulitis of lower leg 12/24/2011  . Acute bronchitis   . Other abnormal blood chemistry   . Chronic systolic heart failure   . Acute on chronic systolic heart failure   . Cellulitis and abscess of leg, except foot   . Hypotension, unspecified   . Helicobacter pylori (H. pylori)   . Acute gastric ulcer with hemorrhage, without mention of obstruction    Past Surgical History  Procedure Laterality Date  . Hip arthroscopy w/ labral repair    . Thyroidectomy    . Esophagogastroduodenoscopy  09/26/2011    Procedure:  ESOPHAGOGASTRODUODENOSCOPY (EGD);  Surgeon: Zenovia Jarred, MD;  Location: [facility] END[facility];  Service: Gastroenterology;  Laterality: N/A;  To be done at bedside.  . Esophagogastroduodenoscopy N/A 09/30/2013    Procedure: ESOPHAGOGASTRODUODENOSCOPY (EGD);  Surgeon: [name], MD;  Location: [facility];  Service: Endoscopy;  Laterality: N/A;  . Esophagogastroduodenoscopy N/A 11/25/2013    Procedure: ESOPHAGOGASTRODUODENOSCOPY (EGD);  Surgeon: [name], MD;  Location: [facility];  Service: Endoscopy;  Laterality: N/A;   Social History:  History   Social History Narrative   Patient lives at home alone. Patient is retired. Patient has high school education.   Caffeine- one daily.   Right handed.    Allergies  Allergen Reactions  . Codeine Other (See Comments)    unknown  . Penicillins Other (See Comments)    unknown    Family History  Problem Relation Age of Onset  . GER disease Mother   . Heart disease Father      Prior to Admission medications   Medication Sig Start Date End Date Taking? Authorizing Provider  carbidopa-levodopa (SINEMET CR) 50-200 MG per tablet Take 1 tablet by mouth at bedtime. 10pm 01/28/14  Yes Dennie Bible, NP  carbidopa-levodopa (SINEMET IR) 25-100 MG per tablet Take 1.5 tablets by mouth 4 (four) times daily. 6am, 10am, 2pm and 6pm 01/28/14  Yes Dennie Bible, NP  furosemide (LASIX) 20 MG tablet Take  20 mg by mouth 2 (two) times daily as needed for fluid.    Yes Historical Provider, MD  HYDROcodone-acetaminophen (NORCO/VICODIN) 5-325 MG per tablet Take 1 tablet by mouth 3 (three) times daily as needed for severe pain. For neck pain 02/11/14  Yes [name] L Reed, DO  Multiple Vitamins-Minerals (MULTIVITAMIN WITH MINERALS) tablet Take 1 tablet by mouth daily.   Yes Historical Provider, MD  pantoprazole (PROTONIX) 40 MG tablet Take 1 tablet (40 mg total) by mouth 2 (two) times daily. 12/28/13  Yes Jerene Bears, MD  polyethylene glycol powder  (GLYCOLAX/MIRALAX) powder Take 17 g by mouth 2 (two) times daily as needed. Patient taking differently: Take 17 g by mouth 2 (two) times daily as needed for mild constipation.  04/18/14  Yes [name] L Reed, DO  pramipexole (MIRAPEX) 1.5 MG tablet Take 1 tablet (1.5 mg total) by mouth 3 (three) times daily. 01/28/14  Yes Dennie Bible, NP  senna (SENOKOT) 8.6 MG TABS tablet Take 2 tablets (17.2 mg total) by mouth daily. Patient taking differently: Take 1-2 tablets by mouth daily as needed for mild constipation.  04/18/14  Yes [name] L Reed, DO  Ferrous Sulfate (IRON) 325 (65 FE) MG TABS Take 1 capsule by mouth daily. Patient not taking: Reported on 08/07/2014 12/23/13   Jerene Bears, MD   Physical Exam: Filed Vitals:   08/07/14 0530 08/07/14 0545 08/07/14 0600 08/07/14 0650  BP: 112/47 116/63 118/57 116/56  Pulse: 93 92 91 87  Temp:    98.3 F (36.8 C)  TempSrc:    Oral  Resp: 21 20 18 19   SpO2: 97% 97% 96% 96%     General:  Alert pleasant oriented, dry mucosa  Eyes: EOMI NCAT [place]  ENT: moderate dentition, no JVD no bruit  Neck: soft supple thyroidectomy scar  Cardiovascular: S1-S2 murmur early systolic  Respiratory: minimal exam as patient cannot get up to listen to her chest however relatively clear on bilateral sides  Abdomen: tender in left lower quadrant no rebound no guarding  Skin: no lower extremity edema  Musculoskeletal: range of motion intact  Psychiatric: euthymic pleasant  Neurologic: grossly intact moves all 4 limbs equally extraocular movements  Labs on Admission:  Basic Metabolic Panel:  Recent Labs Lab 08/07/14 0043  NA 140  K 4.2  CL 104  CO2 23  GLUCOSE 136*  BUN 20  CREATININE 0.75  CALCIUM 8.4   Liver Function Tests:  Recent Labs Lab 08/07/14 0043  AST 17  ALT 6  ALKPHOS 71  BILITOT 0.5  PROT 6.1  ALBUMIN 3.2*   No results for input(s): LIPASE, AMYLASE in the last 168 hours. No results for input(s): AMMONIA in the last 168  hours. CBC:  Recent Labs Lab 08/07/14 0043  WBC 13.5*  NEUTROABS 12.3*  HGB 12.6  HCT 40.3  MCV 87.0  PLT 163   Cardiac Enzymes: No results for input(s): CKTOTAL, CKMB, CKMBINDEX, TROPONINI in the last 168 hours.  BNP (last 3 results) No results for input(s): PROBNP in the last 8760 hours. CBG: No results for input(s): GLUCAP in the last 168 hours.  Radiological Exams on Admission: Ct Abdomen Pelvis W Contrast  08/07/2014   CLINICAL DATA:  Left lower quadrant pain  EXAM: CT ABDOMEN AND PELVIS WITH CONTRAST  TECHNIQUE: Multidetector CT imaging of the abdomen and pelvis was performed using the standard protocol following bolus administration of intravenous contrast.  CONTRAST:  146mL OMNIPAQUE IOHEXOL 300 MG/ML  SOLN  COMPARISON:  None.  FINDINGS: The lung bases are clear.  There is a 2.9 cm hypodense, fluid attenuating left hepatic mass most consistent with a hemangioma. There is a bilobed hypodense right hepatic lobe mass likely representing a cyst. There is no intrahepatic or extrahepatic biliary ductal dilatation. The gallbladder is normal. The spleen demonstrates no focal abnormality.There is a 13 mm left renal hypodense mass most consistent with a cyst. There is a indeterminate 12 mm left inferior pole renal mass. There is a 14 mm hypodense, fluid attenuating right upper pole renal mass most consistent with a cyst. The kidneys, adrenal glands and pancreas are normal. The bladder is unremarkable.  There is a large hiatal hernia. There is a fat containing umbilical hernia. The stomach, duodenum, small intestine, and large intestine demonstrate no contrast extravasation or dilatation. There is no pneumoperitoneum, pneumatosis, or portal venous gas. There is no abdominal or pelvic free fluid. There is no lymphadenopathy.  The abdominal aorta is normal in caliber with atherosclerosis.  There are no lytic or sclerotic osseous lesions. Total right hip arthroplasty. There is degenerative disc  disease most severe at L2-3 and L3-4. There is a T12 vertebral body compression fracture with augmentation.  IMPRESSION: 1. No acute abdominal or pelvic pathology. No findings to explain the patient's left lower quadrant pain. 2. There is an indeterminate 12 mm left inferior pole renal mass. Further characterization with MRI of the kidneys is recommended.   Electronically Signed   By: Kathreen Devoid   On: 08/07/2014 05:36    EKG: Independently reviewed. None performed  Assessment/Plan Principal Problem:   Nausea vomiting and diarrhea-unclear etiology, possible viral illness. Obtain C. Difficile andstool cultures. Only if stool is nonbloody would be use any further loperamide.we will monitor. We'll place her on IV Zofran and Phenergan for nausea. She'll be allowed clear liquid diet.  As we do not have an ileus if this is C. Difficile or not she will need to be on contact precautions and he will hold her Protonix for now   Mild volume depletion/hypotension secondary to above   History of upper GI bleed with Jake Shark of bleed is present however will monitor   Parkinson's disease since 1999-seems reasonably well controlled but is on max doses of Sinemet as an outpatient.continue this regimen 6 AM, 10 AM, 2 PM to 6 PM,also continue Mirapex 1.5 3 times a day-she'll need outpatient neurology follow-up with [facility] as she is supposed to get enrolled in a trial there   indeterminant 12 mm left inferior wall renal mass-will need eventual MRI. Hold for now.   HTN (hypertension)-slightly hypotensive, hold any agentsfor now as she is slightly hypotensive and is on IV saline   Cor pulmonale withDiastolic dysfunction, grade 2-Lasix on hold for now   Anemia, iron deficiency-continue iron supplementation   Questionable CVA-monitor   Dyslipidemia-not on statin hold for now.   Abnormality of gait  No family at bedside Full CODE STATUS Time spent: 55 minutes  Verlon Au [place]  [facility] Pager [phone]  If 7PM-7AM, please contact night-coverage [email or web address] Password [email or web address] 08/07/2014, 7:17 AM

## 2014-08-07 NOTE — ED Notes (Signed)
Patient transported to CT 

## 2014-08-07 NOTE — ED Notes (Signed)
Patient has been incontinent of stool. Patient had 3 watery stools while cleaning her up.

## 2014-08-08 LAB — COMPREHENSIVE METABOLIC PANEL
ALBUMIN: 2.6 g/dL — AB (ref 3.5–5.2)
ALT: 8 U/L (ref 0–35)
AST: 17 U/L (ref 0–37)
Alkaline Phosphatase: 58 U/L (ref 39–117)
Anion gap: 12 (ref 5–15)
BUN: 9 mg/dL (ref 6–23)
CALCIUM: 8.1 mg/dL — AB (ref 8.4–10.5)
CO2: 19 mEq/L (ref 19–32)
CREATININE: 0.54 mg/dL (ref 0.50–1.10)
Chloride: 108 mEq/L (ref 96–112)
GFR calc Af Amer: >90 mL/min (ref >90)
GFR calc non Af Amer: >90 mL/min (ref >90)
Glucose, Bld: 82 mg/dL (ref 70–99)
Potassium: 4 mEq/L (ref 3.7–5.3)
Sodium: 139 mEq/L (ref 137–147)
TOTAL PROTEIN: 5.2 g/dL — AB (ref 6.0–8.3)
Total Bilirubin: 0.4 mg/dL (ref 0.3–1.2)

## 2014-08-08 LAB — PROTIME-INR
INR: 1.16 (ref 0.00–1.49)
Prothrombin Time: 15 seconds (ref 11.6–15.2)

## 2014-08-08 LAB — CBC
HEMATOCRIT: 36.9 % (ref 36.0–46.0)
Hemoglobin: 11.5 g/dL — ABNORMAL LOW (ref 12.0–15.0)
MCH: 27.5 pg (ref 26.0–34.0)
MCHC: 31.2 g/dL (ref 30.0–36.0)
MCV: 88.3 fL (ref 78.0–100.0)
Platelets: 123 10*3/uL — ABNORMAL LOW (ref 150–400)
RBC: 4.18 MIL/uL (ref 3.87–5.11)
RDW: 15.6 % — AB (ref 11.5–15.5)
WBC: 4.8 10*3/uL (ref 4.0–10.5)

## 2014-08-08 LAB — CLOSTRIDIUM DIFFICILE BY PCR: CDIFFPCR: NEGATIVE

## 2014-08-08 MED ORDER — SODIUM CHLORIDE 0.9 % IV SOLN
INTRAVENOUS | Status: DC
  Administered 2014-08-08 – 2014-08-09 (×2): via INTRAVENOUS

## 2014-08-08 NOTE — Plan of Care (Signed)
Problem: Phase I Progression Outcomes Goal: Voiding-avoid urinary catheter unless indicated Outcome: Completed/Met Date Met:  08/08/14     

## 2014-08-08 NOTE — Progress Notes (Signed)
[name] [id number]:[id number] DOB: 1941-07-27 DOA: 08/06/2014 PCP: Hollace Kinnier, DO  Brief narrative: 62 ? h/o Parkinson's since 1999, H/o large hiatal hernia on Endoscopy 11/25/13, htn, prior CVA, H/o UGIB in 2013 2/2 to Gastric ulcer, Diastolic HF + Cor pulmonale, syncope, prior R Fem neck #, thyroidectomy-for a nodule, dyslipidemia. Around suppertime 11/14 started having nausea.  Admitted with nonspecific enteritis r/o Cdiff  Past medical history-As per Problem list Chart reviewed as below- reviewed  Consultants:   none  Procedures:  none  Antibiotics:  none   Subjective  Feels much better EWant sto eat 2 loose stools overnight No cp or sob No n right now No bloody stool   Objective    Interim History:   Telemetry:    Objective: Filed Vitals:   08/07/14 1319 08/07/14 1358 08/07/14 1400 08/08/14 0538  BP:  65/53 99/54 125/70  Pulse:  78 80 71  Temp:    98.4 F (36.9 C)  TempSrc:    Oral  Resp:  20  18  Height:      Weight:      SpO2: 97% 95% 95% 96%    Intake/Output Summary (Last 24 hours) at 08/08/14 1020 Last data filed at 08/08/14 1006  Gross per 24 hour  Intake 1257.5 ml  Output    701 ml  Net  556.5 ml    Exam:  General: eomi, ncat Cardiovascular: s1 s 2no m/r/g Respiratory: clear no added sound Abdomen: soft, slghlty distedned Skin no LE edema Neurointact  Data Reviewed: Basic Metabolic Panel:  Recent Labs Lab 08/07/14 0043 08/08/14 0600  NA 140 139  K 4.2 4.0  CL 104 108  CO2 23 19  GLUCOSE 136* 82  BUN 20 9  CREATININE 0.75 0.54  CALCIUM 8.4 8.1*   Liver Function Tests:  Recent Labs Lab 08/07/14 0043 08/08/14 0600  AST 17 17  ALT 6 8  ALKPHOS 71 58  BILITOT 0.5 0.4  PROT 6.1 5.2*  ALBUMIN 3.2* 2.6*   No results for input(s): LIPASE, AMYLASE in the last 168 hours. No results for input(s): AMMONIA in the last 168 hours. CBC:  Recent Labs Lab 08/07/14 0043 08/08/14 0600  WBC 13.5* 4.8  NEUTROABS  12.3*  --   HGB 12.6 11.5*  HCT 40.3 36.9  MCV 87.0 88.3  PLT 163 123*   Cardiac Enzymes: No results for input(s): CKTOTAL, CKMB, CKMBINDEX, TROPONINI in the last 168 hours. BNP: Invalid input(s): POCBNP CBG: No results for input(s): GLUCAP in the last 168 hours.  No results found for this or any previous visit (from the past 240 hour(s)).   Studies:              All Imaging reviewed and is as per above notation   Scheduled Meds: . sodium chloride   Intravenous Once  . carbidopa-levodopa  1.5 tablet Oral QID  . ferrous sulfate  325 mg Oral Q breakfast  . heparin  5,000 Units Subcutaneous 3 times per day  . pramipexole  1.5 mg Oral TID  . sodium chloride  3 mL Intravenous Q12H   Continuous Infusions: . sodium chloride 125 mL/hr at 08/08/14 0318     Assessment/Plan:  1. Enteritis-Cut back Iv saline 50 cc/hr.  Grad diet, await CDIFF.  Hold ABx 2. Ho UGIB-stable.  No concerns currently-PPI on hold until C. DIff back 3. Parkinson's  1999-Continue Sinemet 1.5 qid as per timing written and Mirapex 1.5 tid 4. L Inf Renal mass-needs OP MRI  to characterize 5.  COr Pulmonale/Grade 2 diastolic dysfunction-monitor on fluids.  Eu volemic.  Lasix 20 bid on hold and re-implemenet as OP.  Not on ACE, BB 6. Borderline microcytic anemia-stable.  Monitor platelets as well 7. HLD-consider OP statin 8. Volume depletion-resolving-IV saline as above  Code Status: Full Family Communication:  None + Disposition Plan:  Inpatient pending Cdiff   Verneita Griffes, MD  [facility] Pager [phone] 08/08/2014, 10:20 AM    LOS: 2 days

## 2014-08-08 NOTE — Care Management Note (Unsigned)
    Page 1 of 1   08/08/2014     11:09:35 AM CARE MANAGEMENT NOTE 08/08/2014  Patient:  [name], [name]   Account Number:  [id number]  Date Initiated:  08/08/2014  Documentation initiated by:  Tomi Bamberger  Subjective/Objective Assessment:   dx abd pain  admit- lives alone.     Action/Plan:   Anticipated DC Date:  08/02/2014   Anticipated DC Plan:  SKILLED NURSING FACILITY  In-house referral  Clinical Social Worker      DC Planning Services  CM consult      Choice offered to / List presented to:             Status of service:  In process, will continue to follow Medicare Important Message given?  YES (If response is "NO", the following Medicare IM given date fields will be blank) Date Medicare IM given:  08/08/2014 Medicare IM given by:  Tomi Bamberger Date Additional Medicare IM given:   Additional Medicare IM given by:    Discharge Disposition:    Per UR Regulation:    If discussed at Long Length of Stay Meetings, dates discussed:    Comments:  08/08/14 [place], [place] Patient lives alone, patient may be for possible snf vs alf, CSW aware.

## 2014-08-08 NOTE — Plan of Care (Signed)
Problem: Phase I Progression Outcomes Goal: Pain controlled with appropriate interventions Outcome: Completed/Met Date Met:  08/08/14

## 2014-08-08 NOTE — Progress Notes (Signed)
Pt had vomited x 1 with smell of stool. MD notified. No new orders at this time. Will continue to monitor.

## 2014-08-09 DIAGNOSIS — G20 Parkinson's disease: Secondary | ICD-10-CM

## 2014-08-09 DIAGNOSIS — R269 Unspecified abnormalities of gait and mobility: Secondary | ICD-10-CM

## 2014-08-09 LAB — CBC WITH DIFFERENTIAL/PLATELET
BASOS ABS: 0 10*3/uL (ref 0.0–0.1)
BASOS PCT: 0 % (ref 0–1)
Eosinophils Absolute: 0.3 10*3/uL (ref 0.0–0.7)
Eosinophils Relative: 4 % (ref 0–5)
HCT: 37.6 % (ref 36.0–46.0)
HEMOGLOBIN: 11.9 g/dL — AB (ref 12.0–15.0)
Lymphocytes Relative: 8 % — ABNORMAL LOW (ref 12–46)
Lymphs Abs: 0.6 10*3/uL — ABNORMAL LOW (ref 0.7–4.0)
MCH: 27.8 pg (ref 26.0–34.0)
MCHC: 31.6 g/dL (ref 30.0–36.0)
MCV: 87.9 fL (ref 78.0–100.0)
MONOS PCT: 11 % (ref 3–12)
Monocytes Absolute: 0.9 10*3/uL (ref 0.1–1.0)
NEUTROS ABS: 6.1 10*3/uL (ref 1.7–7.7)
NEUTROS PCT: 77 % (ref 43–77)
Platelets: 146 10*3/uL — ABNORMAL LOW (ref 150–400)
RBC: 4.28 MIL/uL (ref 3.87–5.11)
RDW: 15.4 % (ref 11.5–15.5)
WBC: 7.9 10*3/uL (ref 4.0–10.5)

## 2014-08-09 LAB — GI PATHOGEN PANEL BY PCR, STOOL
C difficile toxin A/B: NEGATIVE
CAMPYLOBACTER BY PCR: NEGATIVE
CRYPTOSPORIDIUM BY PCR: NEGATIVE
E COLI 0157 BY PCR: NEGATIVE
E coli (ETEC) LT/ST: NEGATIVE
E coli (STEC): NEGATIVE
G LAMBLIA BY PCR: NEGATIVE
Norovirus GI/GII: POSITIVE
Rotavirus A by PCR: NEGATIVE
Salmonella by PCR: NEGATIVE
Shigella by PCR: NEGATIVE

## 2014-08-09 NOTE — Consult Note (Signed)
Physical Medicine and Rehabilitation Consult Reason for Consult:Debilitation/enteritis Referring Physician: [facility]   HPI: [name] is a 73 y.o.right handed female with history of Parkinson's disease maintained on Sinemet,CVA, acute on chronic systolic heart failure. Patient lives alone independent prior to admission using a walker. Patient well known to rehabilitation services from admission January 2015 for deconditioning related to Parkinson's disease with pseudo-exacerbation.present 08/07/2014 with decreased appetite as well as nausea with nonspecific left lower quadrant abdominal pain. She had occasional bouts of diarrhea.C. Difficile specimen negative.CT of abdomen and pelvis showed no acute abdominal abnormality.initially placed on IV fluids for maintenance of hydration. Her diet was slowly advanced.subcutaneous heparin for DVT prophylaxis.Physical therapy evaluation completed 08/09/2014 with recommendations of physical medicine rehabilitation consult.  Review of Systems  Cardiovascular: Positive for leg swelling.  Gastrointestinal: Positive for nausea, abdominal pain and diarrhea.  Musculoskeletal: Positive for myalgias.  Neurological: Positive for weakness.   Past Medical History  Diagnosis Date  . Parkinson's disease   . Hypertension   . Arthritis   . Anemia   . Cellulitis of lower leg 12/24/2011  . Acute bronchitis   . Other abnormal blood chemistry   . Chronic systolic heart failure   . Acute on chronic systolic heart failure   . Cellulitis and abscess of leg, except foot   . Hypotension, unspecified   . Helicobacter pylori (H. pylori)   . Acute gastric ulcer with hemorrhage, without mention of obstruction    Past Surgical History  Procedure Laterality Date  . Hip arthroscopy w/ labral repair    . Thyroidectomy    . Esophagogastroduodenoscopy  09/26/2011    Procedure: ESOPHAGOGASTRODUODENOSCOPY (EGD);  Surgeon: Zenovia Jarred, MD;  Location: [facility] ENDOSCOPY;  Service:  Gastroenterology;  Laterality: N/A;  To be done at bedside.  . Esophagogastroduodenoscopy N/A 09/30/2013    Procedure: ESOPHAGOGASTRODUODENOSCOPY (EGD);  Surgeon: [name], MD;  Location: [place];  Service: Endoscopy;  Laterality: N/A;  . Esophagogastroduodenoscopy N/A 11/25/2013    Procedure: ESOPHAGOGASTRODUODENOSCOPY (EGD);  Surgeon: [name], MD;  Location: [facility] ENDOSCOPY;  Service: Endoscopy;  Laterality: N/A;   Family History  Problem Relation Age of Onset  . GER disease Mother   . Heart disease Father    Social History:  reports that she has never smoked. She has never used smokeless tobacco. She reports that she does not drink alcohol or use illicit drugs. Allergies:  Allergies  Allergen Reactions  . Codeine Other (See Comments)    unknown  . Penicillins Other (See Comments)    unknown   Medications Prior to Admission  Medication Dose Route Frequency Provider Last Rate Last Dose  . ipratropium-albuterol (DUONEB) 0.5-2.5 (3) MG/3ML nebulizer solution 3 mL  3 mL Nebulization Q4H [name] L Reed, DO       Medications Prior to Admission  Medication Sig Dispense Refill  . carbidopa-levodopa (SINEMET CR) 50-200 MG per tablet Take 1 tablet by mouth at bedtime. 10pm 30 tablet 12  . carbidopa-levodopa (SINEMET IR) 25-100 MG per tablet Take 1.5 tablets by mouth 4 (four) times daily. 6am, 10am, 2pm and 6pm 180 tablet 6  . furosemide (LASIX) 20 MG tablet Take 20 mg by mouth 2 (two) times daily as needed for fluid.     Marland Kitchen HYDROcodone-acetaminophen (NORCO/VICODIN) 5-325 MG per tablet Take 1 tablet by mouth 3 (three) times daily as needed for severe pain. For neck pain 90 tablet 0  . Multiple Vitamins-Minerals (MULTIVITAMIN WITH MINERALS) tablet Take 1 tablet by  mouth daily.    . pantoprazole (PROTONIX) 40 MG tablet Take 1 tablet (40 mg total) by mouth 2 (two) times daily. 60 tablet 6  . polyethylene glycol powder (GLYCOLAX/MIRALAX) powder Take 17 g by mouth 2 (two) times daily as  needed. (Patient taking differently: Take 17 g by mouth 2 (two) times daily as needed for mild constipation. ) 3350 g 3  . pramipexole (MIRAPEX) 1.5 MG tablet Take 1 tablet (1.5 mg total) by mouth 3 (three) times daily. 90 tablet 6  . senna (SENOKOT) 8.6 MG TABS tablet Take 2 tablets (17.2 mg total) by mouth daily. (Patient taking differently: Take 1-2 tablets by mouth daily as needed for mild constipation. ) 60 each 3  . Ferrous Sulfate (IRON) 325 (65 FE) MG TABS Take 1 capsule by mouth daily. (Patient not taking: Reported on 08/07/2014) 30 each 6    Home: [place] expects to be discharged to:: Private residence Living Arrangements: Alone Available Help at Discharge: Family, Available PRN/intermittently Type of Home: House Home Access: [place]: One [place]: [place] - 4 wheels, Bedside commode, Tub bench Additional Comments: pt lives alone, brothers get her groceries for her, she has Meals on Wheels for lunch and one of her brothers brings her supper.  Functional History: Prior Function Level of Independence: Independent with assistive device(s) Comments: goes to church 1 x week using SCAT Functional Status:  Mobility: Bed Mobility Overal bed mobility: Needs Assistance Bed Mobility: Sidelying to Sit Sidelying to sit: Min assist General bed mobility comments: use of rail and struggle with minimal truncal assist Transfers Overall transfer level: Needs assistance Transfers: Sit to/from Stand, Stand Pivot Transfers Sit to Stand: Min guard Stand pivot transfers: Min assist General transfer comment: Use of bed against legs for stability Ambulation/Gait Ambulation/Gait assistance: Min assist Ambulation Distance (Feet): 75 Feet Assistive device: Rolling walker (2 wheeled) Gait Pattern/deviations: Step-through pattern, Scissoring Gait velocity: slow and guarded General Gait Details: gait characterized by tremor, mild ataxia, steppage,  but not the usual festinating gait.  Generally unsteady with list to left and moderate use of the RW.    ADL:    Cognition: Cognition Orientation Level: Oriented to person, Oriented to place, Oriented to time, Disoriented to situation Cognition Arousal/Alertness: Awake/alert Behavior During Therapy: [facility] for tasks assessed/performed  Blood pressure 127/61, pulse 78, temperature 98.6 F (37 C), temperature source Oral, resp. rate 18, height 5\' 4"  (1.626 m), weight 82.192 kg (181 lb 3.2 oz), SpO2 96 %. Physical Exam  Eyes: EOM are normal.  Neck: Normal range of motion. Neck supple. No thyromegaly present.  Cardiovascular: Normal rate and regular rhythm.   Respiratory: Effort normal and breath sounds normal. No respiratory distress.  GI: Soft. Bowel sounds are normal. She exhibits no distension. There is no tenderness.  Neurological:  Patient with ataxic speech. She is oriented 3. Intermittent bilateral upper and lower body tremor. Patient with head bobbing movements per baseline. She does follow simple commands. Reasonable insight and awareness. UES: 4+/5 LE: 3+ to 4- HF, 4 ke, 4+ ankles.     Results for orders placed or performed during the hospital encounter of 08/06/14 (from the past 24 hour(s))  CBC with Differential     Status: Abnormal   Collection Time: 08/09/14  6:31 AM  Result Value Ref Range   WBC 7.9 4.0 - 10.5 K/uL   RBC 4.28 3.87 - 5.11 MIL/uL   Hemoglobin 11.9 (L) 12.0 - 15.0 g/dL   HCT 37.6 36.0 -  46.0 %   MCV 87.9 78.0 - 100.0 fL   MCH 27.8 26.0 - 34.0 pg   MCHC 31.6 30.0 - 36.0 g/dL   RDW 15.4 11.5 - 15.5 %   Platelets 146 (L) 150 - 400 K/uL   Neutrophils Relative % 77 43 - 77 %   Neutro Abs 6.1 1.7 - 7.7 K/uL   Lymphocytes Relative 8 (L) 12 - 46 %   Lymphs Abs 0.6 (L) 0.7 - 4.0 K/uL   Monocytes Relative 11 3 - 12 %   Monocytes Absolute 0.9 0.1 - 1.0 K/uL   Eosinophils Relative 4 0 - 5 %   Eosinophils Absolute 0.3 0.0 - 0.7 K/uL   Basophils Relative 0 0 -  1 %   Basophils Absolute 0.0 0.0 - 0.1 K/uL   No results found.  Assessment/Plan: Diagnosis: weakness after ?viral enteritis, nausea. Hx of PD 1. Does the need for close, 24 hr/day medical supervision in concert with the patient's rehab needs make it unreasonable for this patient to be served in a less intensive setting? No and Potentially 2. Co-Morbidities requiring supervision/potential complications: htn, chf 3. Due to bladder management, bowel management, safety, skin/wound care, disease management, medication administration and patient education, does the patient require 24 hr/day rehab nursing? No and Potentially 4. Does the patient require coordinated care of a physician, rehab nurse, PT (1-2 hrs/day, 5 days/week) and OT (1-2 hrs/day, 5 days/week) to address physical and functional deficits in the context of the above medical diagnosis(es)? No and Potentially Addressing deficits in the following areas: balance, endurance, locomotion, strength, transferring, bowel/bladder control, bathing, dressing, feeding, grooming, toileting and psychosocial support 5. Can the patient actively participate in an intensive therapy program of at least 3 hrs of therapy per day at least 5 days per week? Potentially 6. The potential for patient to make measurable gains while on inpatient rehab is fair 7. Anticipated functional outcomes upon discharge from inpatient rehab are modified independent and supervision  with PT, modified independent and supervision with OT, modified independent and supervision with SLP. 8. Estimated rehab length of stay to reach the above functional goals is: see below 9. Does the patient have adequate social supports and living environment to accommodate these discharge functional goals? Yes 10. Anticipated D/C setting: Home 11. Anticipated post D/C treatments: [place] therapy 12. Overall Rehab/Functional Prognosis: excellent  RECOMMENDATIONS: This patient's condition is appropriate for  continued rehabilitative care in the following setting: [facility] Therapy vs CIR admission Patient has agreed to participate in recommended program. Yes Note that insurance prior authorization may be required for reimbursement for recommended care.  Comment: Pt has only been in the hospital since 11/14. She doesn't appear too far from baseline. Would like to see follow up therapies tomorrow to see what further progress she makes. If progress continues to be slow, we can look at [facility] admission. Rehab Admissions Coordinator to follow up.  Thanks,  [name], MD, Mellody Drown     08/09/2014

## 2014-08-09 NOTE — Plan of Care (Signed)
Problem: Phase I Progression Outcomes Goal: OOB as tolerated unless otherwise ordered Outcome: Progressing Goal: Initial discharge plan identified Outcome: Progressing Goal: Hemodynamically stable Outcome: Progressing  Problem: Phase II Progression Outcomes Goal: Progress activity as tolerated unless otherwise ordered Outcome: Progressing Goal: Discharge plan established Outcome: Progressing Goal: Vital signs remain stable Outcome: Progressing Goal: Obtain order to discontinue catheter if appropriate Outcome: Not Applicable Date Met:  50/09/38  Problem: Phase III Progression Outcomes Goal: Pain controlled on oral analgesia Outcome: Progressing Goal: Foley discontinued Outcome: Not Applicable Date Met:  18/29/93

## 2014-08-09 NOTE — Progress Notes (Signed)
Inpatient Rehabilitation  I note that IP Rehab consult has been ordered.  Will await input from rehab MD and follow up accordingly.  Please call if questions.  [place] Admissions Coordinator Cell [phone] Office [phone]

## 2014-08-09 NOTE — Progress Notes (Signed)
Patient seen examiend and evalauted with MS Alene Mires She is medically stable for D/c but is qweak on her feet She is toelrating diet SHe had mild nauseas this am.  SHe has no sob fever cp cp diarr dark or tarry stool. Feels a little bloated   Data Wbc 7.8, Hb stable 11 range BP 127/61 mmHg  Pulse 78  Temp(Src) 98.6 F (37 C) (Oral)  Resp 18  Ht 5\' 4"  (1.626 m)  Wt 82.192 kg (181 lb 3.2 oz)  BMI 31.09 kg/m2  SpO2 96%    EOMI, NCAt s1 s 2no m/r/g Soft slightly distended abdomen  See d/c summary-therapy evaluated her and feels CIR is appropriate.  If not she will need potentially HHPT or [name] and I will speak to CM about this.  Verneita Griffes, MD [facility] [phone]

## 2014-08-09 NOTE — Clinical Social Work Psychosocial (Addendum)
    Clinical Social Work Department BRIEF PSYCHOSOCIAL ASSESSMENT 08/09/2014  Patient:  [name], [name]     Account Number:  [id number]     Admit date:  08/06/2014  Clinical Social Worker:  Adair Laundry  Date/Time:  08/09/2014 04:23 PM  Referred by:  Physician  Date Referred:  08/09/2014 Referred for  [name] Placement   Other Referral:   Interview type:  Patient Other interview type:    PSYCHOSOCIAL DATA Living Status:  ALONE Admitted from facility:   Level of care:   Primary support name:  Beaulah Dinning Primary support relationship to patient:  S[place] Degree of support available:   Pt has good support    CURRENT CONCERNS Current Concerns  Post-Acute Placement   Other Concerns:    SOCIAL WORK ASSESSMENT / PLAN CSW aware of PT recommendation for [name] and pt medical stability. Pt does not have qualifying 3 night stay for Medicare coverage of [name]. CSW visited pt to discuss dc plan. Pt informed CSW she lives at home alone and has a brother and niece that check in on her. Pt niece visits at least 2 or 3 times a week. Pt also informed CSW she receives mobile meals and feels as though she manages well with current arrangement. CSW notified pt that she may not be able to dc to CIR and that [name] would not be covered by insurance. Pt informed CSW she would not be able to pay privately at [facility]. Pt has [facility] with [facility] and would be open to being set up with this service again. After discussing further with CSW, pt agreeable to asking family and neighbors to provide more supervision or checking in on pt more frequently. Pt did not relay concerns about discharging home, but did have multiple questions regarding discharge date and cause of hospitalization. CSW notified pt that per last MD note she is stable for dc but CSW will ask nursing to clarify if this will change based on dc plan. Pt reported that if she discharges home her brother will be able to provide transportation  after work. CSW relayed pt questions to nursing staff. CSW notified RNCM of conversation with pt. At this time, pt has no further hospital social work needs. CSW signing off.   Assessment/plan status:  No Further Intervention Required Other assessment/ plan:   Information/referral to community resources:   [name] list denied (unable to pay privately)    PATIENT'S/FAMILY'S RESPONSE TO PLAN OF CARE: Pt is agreeable to dc home should she not be able to dc to CIR.    [place], [place]

## 2014-08-09 NOTE — Evaluation (Signed)
Physical Therapy Evaluation Patient Details Name: [name] MRN: [id number] DOB: 02/02/1941 Today's Date: 08/09/2014   History of Present Illness  pt admitted with non specific enteritis.   C-diff r/o.  Clinical Impression  Pt admitted with/for non specific enteritis, but has progressively worsening deconditioning.  Pt currently limited functionally due to the problems listed. ( See problems list.)   Pt will benefit from PT to maximize function and safety in order to get ready for next venue listed below.     Follow Up Recommendations CIR    Equipment Recommendations  None recommended by PT    Recommendations for Other Services Rehab consult     Precautions / Restrictions Precautions Precautions: Fall      Mobility  Bed Mobility Overal bed mobility: Needs Assistance Bed Mobility: Sidelying to Sit   Sidelying to sit: Min assist       General bed mobility comments: use of rail and struggle with minimal truncal assist  Transfers Overall transfer level: Needs assistance   Transfers: Sit to/from Stand;Stand Pivot Transfers Sit to Stand: Min guard Stand pivot transfers: Min assist       General transfer comment: Use of bed against legs for stability  Ambulation/Gait Ambulation/Gait assistance: Min assist Ambulation Distance (Feet): 75 Feet Assistive device: Rolling walker (2 wheeled) Gait Pattern/deviations: Step-through pattern;Scissoring Gait velocity: slow and guarded   General Gait Details: gait characterized by tremor, mild ataxia, steppage, but not the usual festinating gait.  Generally unsteady with list to left and moderate use of the RW.  Stairs            Wheelchair Mobility    Modified Rankin (Stroke Patients Only)       Balance Overall balance assessment: Needs assistance Sitting-balance support: No upper extremity supported Sitting balance-Leahy Scale: Fair     Standing balance support: Bilateral upper extremity supported (and  legs to steady) Standing balance-Leahy Scale: Poor                               Pertinent Vitals/Pain Pain Assessment: No/denies pain    Home Living Family/patient expects to be discharged to:: Private residence Living Arrangements: Alone Available Help at Discharge: Family;Available PRN/intermittently Type of Home: House Home Access: Ramped entrance     Home Layout: One level Home Equipment: [place] - 4 wheels;Bedside commode;Tub bench Additional Comments: pt lives alone, brothers get her groceries for her, she has Meals on Wheels for lunch and one of her brothers brings her supper.    Prior Function Level of Independence: Independent with assistive device(s)         Comments: goes to church 1 x week using SCAT     Hand Dominance   Dominant Hand: Right    Extremity/Trunk Assessment               Lower Extremity Assessment: RLE deficits/detail;LLE deficits/detail RLE Deficits / Details: general weakness overall, but notable at hip flexors 4-/5, quads 4/5, df and pf at 4-/5 LLE Deficits / Details: generalized weakness at grossly 4/5 with hip flexors, df and pf  at 4-/5     Communication   Communication: No difficulties  Cognition Arousal/Alertness: Awake/alert Behavior During Therapy: [facility] for tasks assessed/performed                        General Comments      Exercises        Assessment/Plan  PT Assessment Patient needs continued PT services  PT Diagnosis Difficulty walking;Generalized weakness   PT Problem List Decreased strength;Decreased activity tolerance;Decreased balance;Decreased mobility;Decreased coordination  PT Treatment Interventions     PT Goals (Current goals can be found in the Care Plan section) Acute Rehab PT Goals Patient Stated Goal: Still get more therapy to build strength to be safe and I at home PT Goal Formulation: With patient Time For Goal Achievement: 08/16/14 Potential to Achieve Goals: Fair     Frequency Min 3X/week   Barriers to discharge Decreased caregiver support      Co-evaluation               End of Session   Activity Tolerance: Patient tolerated treatment well Patient left: in chair;with call bell/phone within reach;with chair alarm set Nurse Communication: Mobility status         Time: 9169-4503 PT Time Calculation (min) (ACUTE ONLY): 25 min   Charges:   PT Evaluation $Initial PT Evaluation Tier I: 1 Procedure PT Treatments $Gait Training: 8-22 mins   PT G Codes:          Jametta Moorehead, Tessie Fass 08/09/2014, 1:22 PM 08/09/2014  Donnella Sham, PT [phone]  (pager)

## 2014-08-09 NOTE — Discharge Summary (Signed)
Physician Discharge Summary  [name] YQM:[id number] DOB: May 23, 1941 DOA: 08/06/2014  PCP: Hollace Kinnier, DO  Admit date: 08/06/2014 Discharge date: 08/10/2014  Time spent: 65 minutes  Recommendations for Outpatient Follow-up:  1. Follow up CBC, lipid profile with PCP 2. Follow up with PCP for recommendation of MRI for further assessment of renal mass 3. Discharge to home with home health PT  Discharge Diagnoses:  Principal Problem:   Nausea vomiting and diarrhea Active Problems:   Parkinson's disease   HTN (hypertension)   Cor pulmonale   Diastolic dysfunction, grade 2   Anemia, iron deficiency   Abnormality of gait   Enteritis   Discharge Condition: Stable  Diet recommendation: Regular diet  Filed Weights   08/07/14 0808  Weight: 82.192 kg (181 lb 3.2 oz)    History of present illness:  [name] is 73 yo female with PMH of Parkinson's since 1999, H/o large hiatal hernia on Endoscopy 11/25/13, HTN, prior CVA, H/o UGIB in 2013 secondary to Gastric ulcer, Diastolic CHF + Cor pulmonale, syncope, prior R Fem neck fracture, thyroidectomy for a nodule, and dyslipidemia that presents with nausea, vomiting, and diarrhea for past few days. she initially had nausea,then developed LLQ abd pain that was sharp, constant, and associated with non bloody diarrhea. she denies recent abx use, or laxative use, and  occasionnlay takes stool softener.  She admits to mild dizziness. She denies fever, cp, SOB, LE edema, unilateral weakness, seizure, recent illness, or sick contact exposure. In ED, she had one episode of vomiting, and multiple watery, non bloody stools. She is admitted for further workup.   Hospital Course:   Enteritis -Presumed viral in etiology.   Presented with nausea, vomiting, diarrhea, and LLQ abd pain. Ct abd with no acute pathology, and with finding for renal mass. Cdiff is negative. Placed on IV fluids and NPO diet. Diet is then advanced and pt tolerated well a  regular diet.  On discharge, no nausea, vomiting, or LLQ pain.   L Inferior Renal Mass -Finding on abd CT- 12 mm left inferior pole renal mass. Recommendation for further workup with MRI -Follow up with PCP for further assessment with MRI  Parkinson's Disease -Continue Sinemet 1.5 QID and Mirapex 1.5 TID as per instructions -PT recommends IRC  Hx of UGIB -No complaints-Hgb stable at 11.9 -Resume Protonix 40mg  BID  Cor Pulmonale/ Grade 2 Diastolic dysfunction -Stable and euvolemic on discharge.  Lasix held during hospiralization -On discharge, resume Lasix 20 mg PO BID  -Follow up with PCP for possible initiation of ACEI, BB  Microcytic Anmeia -Hgb stable at 11.9 on discharge. No signs of bleeding -Continue iron tablet daily -Follow up CBC with PCP  Hyperlipidemia -Not currently on statin -Follow up lipid profile with PCP   Hx Constipation -recent history of diarrhea -Take Miralax and [place] only as needed for constipation  Volume Depletion -Resolved on discharge- maintained on IV fluids during hospitalization  Procedures:  None  Consultations:  None  Discharge Exam: Filed Vitals:   08/10/14 0608  BP: 140/77  Pulse: 74  Temp: 98.2 F (36.8 C)  Resp: 22     Exam General: Alert Caucasian female in NAD. With ataxic speech, head bobbing, and bilateral upper and lower body tremor. Cardiovascular: Regular rate and rhythm.  No murmurs, rubs, or gallops. Respiratory: Clear to auscultate bilaterally.  No rhonchi or crepitations. Abdomen: Soft nontender bowel sounds present. No guarding or rigidity.  Musculoskeletal: No LE edema.  Neurologic: A and O x3.  Discharge Instructions   Medication List    TAKE these medications        carbidopa-levodopa 25-100 MG per tablet  Commonly known as:  SINEMET IR  Take 1.5 tablets by mouth 4 (four) times daily. 6am, 10am, 2pm and 6pm     furosemide 20 MG tablet  Commonly known as:  LASIX  Take 20 mg by mouth 2  (two) times daily as needed for fluid.     HYDROcodone-acetaminophen 5-325 MG per tablet  Commonly known as:  NORCO/VICODIN  Take 1 tablet by mouth 3 (three) times daily as needed for severe pain. For neck pain     Iron 325 (65 FE) MG Tabs  Take 1 capsule by mouth daily.     multivitamin with minerals tablet  Take 1 tablet by mouth daily.     pantoprazole 40 MG tablet  Commonly known as:  PROTONIX  Take 1 tablet (40 mg total) by mouth 2 (two) times daily.     polyethylene glycol powder powder  Commonly known as:  GLYCOLAX/MIRALAX  Take 17 g by mouth 2 (two) times daily as needed.     pramipexole 1.5 MG tablet  Commonly known as:  MIRAPEX  Take 1 tablet (1.5 mg total) by mouth 3 (three) times daily.     senna 8.6 MG Tabs tablet  Commonly known as:  SENOKOT  Take 2 tablets (17.2 mg total) by mouth daily.       Allergies  Allergen Reactions  . Codeine Other (See Comments)    unknown  . Penicillins Other (See Comments)    unknown   Follow-up Information    Follow up with REED, [name], DO. Schedule an appointment as soon as possible for a visit in 1 week.   Specialty:  Geriatric Medicine   Contact information:   [address] [place] [address] [phone]        The results of significant diagnostics from this hospitalization (including imaging, microbiology, ancillary and laboratory) are listed below for reference.    Significant Diagnostic Studies: Ct Abdomen Pelvis W Contrast  08/07/2014   CLINICAL DATA:  Left lower quadrant pain  EXAM: CT ABDOMEN AND PELVIS WITH CONTRAST  TECHNIQUE: Multidetector CT imaging of the abdomen and pelvis was performed using the standard protocol following bolus administration of intravenous contrast.  CONTRAST:  139mL OMNIPAQUE IOHEXOL 300 MG/ML  SOLN  COMPARISON:  None.  FINDINGS: The lung bases are clear.  There is a 2.9 cm hypodense, fluid attenuating left hepatic mass most consistent with a hemangioma. There is a bilobed  hypodense right hepatic lobe mass likely representing a cyst. There is no intrahepatic or extrahepatic biliary ductal dilatation. The gallbladder is normal. The spleen demonstrates no focal abnormality.There is a 13 mm left renal hypodense mass most consistent with a cyst. There is a indeterminate 12 mm left inferior pole renal mass. There is a 14 mm hypodense, fluid attenuating right upper pole renal mass most consistent with a cyst. The kidneys, adrenal glands and pancreas are normal. The bladder is unremarkable.  There is a large hiatal hernia. There is a fat containing umbilical hernia. The stomach, duodenum, small intestine, and large intestine demonstrate no contrast extravasation or dilatation. There is no pneumoperitoneum, pneumatosis, or portal venous gas. There is no abdominal or pelvic free fluid. There is no lymphadenopathy.  The abdominal aorta is normal in caliber with atherosclerosis.  There are no lytic or sclerotic osseous lesions. Total right hip arthroplasty. There is degenerative disc  disease most severe at L2-3 and L3-4. There is a T12 vertebral body compression fracture with augmentation.  IMPRESSION: 1. No acute abdominal or pelvic pathology. No findings to explain the patient's left lower quadrant pain. 2. There is an indeterminate 12 mm left inferior pole renal mass. Further characterization with MRI of the kidneys is recommended.   Electronically Signed   By: Kathreen Devoid   On: 08/07/2014 05:36    Microbiology: Recent Results (from the past 240 hour(s))  Clostridium Difficile by PCR     Status: None   Collection Time: 08/07/14  8:48 PM  Result Value Ref Range Status   C difficile by pcr NEGATIVE NEGATIVE Final     Labs: Basic Metabolic Panel:  Recent Labs Lab 08/07/14 0043 08/08/14 0600  NA 140 139  K 4.2 4.0  CL 104 108  CO2 23 19  GLUCOSE 136* 82  BUN 20 9  CREATININE 0.75 0.54  CALCIUM 8.4 8.1*   Liver Function Tests:  Recent Labs Lab 08/07/14 0043  08/08/14 0600  AST 17 17  ALT 6 8  ALKPHOS 71 58  BILITOT 0.5 0.4  PROT 6.1 5.2*  ALBUMIN 3.2* 2.6*   No results for input(s): LIPASE, AMYLASE in the last 168 hours. No results for input(s): AMMONIA in the last 168 hours. CBC:  Recent Labs Lab 08/07/14 0043 08/08/14 0600 08/09/14 0631  WBC 13.5* 4.8 7.9  NEUTROABS 12.3*  --  6.1  HGB 12.6 11.5* 11.9*  HCT 40.3 36.9 37.6  MCV 87.0 88.3 87.9  PLT 163 123* 146*   Cardiac Enzymes: No results for input(s): CKTOTAL, CKMB, CKMBINDEX, TROPONINI in the last 168 hours. BNP: BNP (last 3 results) No results for input(s): PROBNP in the last 8760 hours. CBG: No results for input(s): GLUCAP in the last 168 hours.     Signed:  Lacy Duverney PA-C  [facility] 08/10/2014, 9:30 AM

## 2014-08-10 ENCOUNTER — Encounter: Payer: Self-pay | Admitting: Neurology

## 2014-08-10 DIAGNOSIS — K529 Noninfective gastroenteritis and colitis, unspecified: Principal | ICD-10-CM

## 2014-08-10 DIAGNOSIS — D509 Iron deficiency anemia, unspecified: Secondary | ICD-10-CM

## 2014-08-10 DIAGNOSIS — I10 Essential (primary) hypertension: Secondary | ICD-10-CM

## 2014-08-10 DIAGNOSIS — I519 Heart disease, unspecified: Secondary | ICD-10-CM

## 2014-08-10 NOTE — Progress Notes (Signed)
Assisted pt. With putting on clothes. Pt. Received discharge instructions. Educated pt. On follow-up appointments. Pt. Has Home Health arranged by CM. Received a call from infection control that pt. Tested positive for norovirus. Informed MD and he stated to educate pt. About importance of handwashing and staying hydrated. Stressed importance of handwashing and hydration. IV has already been removed. No skin issues noted. All questions answered. No further needs noted at this time.

## 2014-08-10 NOTE — Plan of Care (Signed)
Problem: Consults Goal: General Medical Patient Education See Patient Education Module for specific education.  Outcome: Completed/Met Date Met:  08/10/14 Goal: Skin Care Protocol Initiated - if Braden Score 18 or less If consults are not indicated, leave blank or document N/A  Outcome: Not Applicable Date Met:  08/10/14 Goal: Nutrition Consult-if indicated Outcome: Not Applicable Date Met:  08/10/14 Goal: Diabetes Guidelines if Diabetic/Glucose > 140 If diabetic or lab glucose is > 140 mg/dl - Initiate Diabetes/Hyperglycemia Guidelines & Document Interventions  Outcome: Not Applicable Date Met:  08/10/14  Problem: Phase I Progression Outcomes Goal: OOB as tolerated unless otherwise ordered Outcome: Completed/Met Date Met:  08/10/14 Goal: Initial discharge plan identified Outcome: Completed/Met Date Met:  08/10/14 Goal: Hemodynamically stable Outcome: Completed/Met Date Met:  08/10/14 Goal: Other Phase I Outcomes/Goals Outcome: Not Applicable Date Met:  08/10/14  Problem: Phase II Progression Outcomes Goal: Progress activity as tolerated unless otherwise ordered Outcome: Completed/Met Date Met:  08/10/14 Goal: Discharge plan established Outcome: Completed/Met Date Met:  08/10/14 Goal: Vital signs remain stable Outcome: Completed/Met Date Met:  08/10/14 Goal: IV changed to normal saline lock Outcome: Completed/Met Date Met:  08/10/14 Goal: Other Phase II Outcomes/Goals Outcome: Not Applicable Date Met:  08/10/14  Problem: Phase III Progression Outcomes Goal: Pain controlled on oral analgesia Outcome: Completed/Met Date Met:  08/10/14 Goal: Activity at appropriate level-compared to baseline (UP IN CHAIR FOR HEMODIALYSIS)  Outcome: Completed/Met Date Met:  08/10/14 Goal: Voiding independently Outcome: Completed/Met Date Met:  08/10/14 Goal: IV/normal saline lock discontinued Outcome: Completed/Met Date Met:  08/10/14 Goal: Discharge plan remains appropriate-arrangements  made Outcome: Completed/Met Date Met:  08/10/14 Goal: Other Phase III Outcomes/Goals Outcome: Not Applicable Date Met:  08/10/14  Problem: Discharge Progression Outcomes Goal: Discharge plan in place and appropriate Outcome: Completed/Met Date Met:  08/10/14 Goal: Pain controlled with appropriate interventions Outcome: Completed/Met Date Met:  08/10/14 Goal: Hemodynamically stable Outcome: Completed/Met Date Met:  08/10/14 Goal: Complications resolved/controlled Outcome: Completed/Met Date Met:  08/10/14 Goal: Tolerating diet Outcome: Completed/Met Date Met:  08/10/14 Goal: Activity appropriate for discharge plan Outcome: Completed/Met Date Met:  08/10/14 Goal: Other Discharge Outcomes/Goals Outcome: Not Applicable Date Met:  08/10/14     

## 2014-08-10 NOTE — Progress Notes (Signed)
Rehab admissions - Evaluated for possible admission.  I spoke with Dr. Naaman Plummer about patient this am.  He feels that she may be able to go home with [facility] therapies.  If patient remains in hospital, I will see how she does with therapies over the next day or so.  Currently rehab beds are full today.  Call me for questions.  #[phone]

## 2014-08-10 NOTE — Care Management (Signed)
CARE MANAGEMENT NOTE 08/10/2014  Patient:  [name], [name]   Account Number:  [id number]  Date Initiated:  08/08/2014  Documentation initiated by:  Tomi Bamberger  Subjective/Objective Assessment:   dx abd pain  admit- lives alone.     Action/Plan:   Discharged home on 08/10/14 with home health RN, OT, PT, SW   Anticipated DC Date:  08/10/2014   Anticipated DC Plan:  [place] referral  Clinical Social Worker      DC Forensic scientist  CM consult      [facility] Choice  HOME HEALTH   Choice offered to / List presented to:  C-1 Patient        [place] arranged  HH-2 PT  HH-1 RN  HH-3 OT  [place].   Status of service:  Completed, signed off Medicare Important Message given?  YES (If response is "NO", the following Medicare IM given date fields will be blank) Date Medicare IM given:  08/08/2014 Medicare IM given by:  Tomi Bamberger Date Additional Medicare IM given:   Additional Medicare IM given by:    Discharge Disposition:  [place]  Per UR Regulation:  Reviewed for med. necessity/level of care/duration of stay  If discussed at [place] of Stay Meetings, dates discussed:    Comments:  08/08/14 [place], [place] Patient lives alone, patient may be for possible snf vs alf, CSW aware.  08/10/14 [place], RN, BSN, CCM.   Patient will be dischaged home today with [place], PT/OT, SW. Patient chose [place].  NCM has arranged Home Health needs per MD order and patient preference of [place] for RN, PT/OT, SW respectively.  SOC will begin 24-48 hours post discharge.

## 2014-08-11 DIAGNOSIS — G20 Parkinson's disease: Secondary | ICD-10-CM

## 2014-08-11 DIAGNOSIS — I10 Essential (primary) hypertension: Secondary | ICD-10-CM

## 2014-08-11 DIAGNOSIS — I2781 Cor pulmonale (chronic): Secondary | ICD-10-CM

## 2014-08-11 DIAGNOSIS — R269 Unspecified abnormalities of gait and mobility: Secondary | ICD-10-CM | POA: Diagnosis not present

## 2014-08-11 DIAGNOSIS — K529 Noninfective gastroenteritis and colitis, unspecified: Secondary | ICD-10-CM

## 2014-08-11 DIAGNOSIS — D509 Iron deficiency anemia, unspecified: Secondary | ICD-10-CM | POA: Diagnosis not present

## 2014-08-15 DIAGNOSIS — I2781 Cor pulmonale (chronic): Secondary | ICD-10-CM | POA: Diagnosis not present

## 2014-08-15 DIAGNOSIS — K529 Noninfective gastroenteritis and colitis, unspecified: Secondary | ICD-10-CM | POA: Diagnosis not present

## 2014-08-15 DIAGNOSIS — D509 Iron deficiency anemia, unspecified: Secondary | ICD-10-CM | POA: Diagnosis not present

## 2014-08-15 DIAGNOSIS — R269 Unspecified abnormalities of gait and mobility: Secondary | ICD-10-CM | POA: Diagnosis not present

## 2014-08-15 DIAGNOSIS — G20 Parkinson's disease: Secondary | ICD-10-CM | POA: Diagnosis not present

## 2014-08-15 DIAGNOSIS — I10 Essential (primary) hypertension: Secondary | ICD-10-CM | POA: Diagnosis not present

## 2014-08-16 ENCOUNTER — Ambulatory Visit: Payer: Medicare Other | Admitting: Internal Medicine

## 2014-08-16 ENCOUNTER — Encounter: Payer: Self-pay | Admitting: Neurology

## 2014-08-16 DIAGNOSIS — K529 Noninfective gastroenteritis and colitis, unspecified: Secondary | ICD-10-CM | POA: Diagnosis not present

## 2014-08-16 DIAGNOSIS — I10 Essential (primary) hypertension: Secondary | ICD-10-CM | POA: Diagnosis not present

## 2014-08-16 DIAGNOSIS — G20 Parkinson's disease: Secondary | ICD-10-CM | POA: Diagnosis not present

## 2014-08-16 DIAGNOSIS — R269 Unspecified abnormalities of gait and mobility: Secondary | ICD-10-CM | POA: Diagnosis not present

## 2014-08-16 DIAGNOSIS — D509 Iron deficiency anemia, unspecified: Secondary | ICD-10-CM | POA: Diagnosis not present

## 2014-08-16 DIAGNOSIS — I2781 Cor pulmonale (chronic): Secondary | ICD-10-CM | POA: Diagnosis not present

## 2014-08-17 ENCOUNTER — Ambulatory Visit: Payer: Medicare Other | Admitting: Internal Medicine

## 2014-08-17 DIAGNOSIS — K529 Noninfective gastroenteritis and colitis, unspecified: Secondary | ICD-10-CM | POA: Diagnosis not present

## 2014-08-17 DIAGNOSIS — D509 Iron deficiency anemia, unspecified: Secondary | ICD-10-CM | POA: Diagnosis not present

## 2014-08-17 DIAGNOSIS — G20 Parkinson's disease: Secondary | ICD-10-CM | POA: Diagnosis not present

## 2014-08-17 DIAGNOSIS — I2781 Cor pulmonale (chronic): Secondary | ICD-10-CM | POA: Diagnosis not present

## 2014-08-17 DIAGNOSIS — I10 Essential (primary) hypertension: Secondary | ICD-10-CM | POA: Diagnosis not present

## 2014-08-17 DIAGNOSIS — R269 Unspecified abnormalities of gait and mobility: Secondary | ICD-10-CM | POA: Diagnosis not present

## 2014-08-20 DIAGNOSIS — G20 Parkinson's disease: Secondary | ICD-10-CM | POA: Diagnosis not present

## 2014-08-20 DIAGNOSIS — R269 Unspecified abnormalities of gait and mobility: Secondary | ICD-10-CM | POA: Diagnosis not present

## 2014-08-20 DIAGNOSIS — D509 Iron deficiency anemia, unspecified: Secondary | ICD-10-CM | POA: Diagnosis not present

## 2014-08-20 DIAGNOSIS — K529 Noninfective gastroenteritis and colitis, unspecified: Secondary | ICD-10-CM | POA: Diagnosis not present

## 2014-08-20 DIAGNOSIS — I2781 Cor pulmonale (chronic): Secondary | ICD-10-CM | POA: Diagnosis not present

## 2014-08-20 DIAGNOSIS — I10 Essential (primary) hypertension: Secondary | ICD-10-CM | POA: Diagnosis not present

## 2014-08-22 DIAGNOSIS — K529 Noninfective gastroenteritis and colitis, unspecified: Secondary | ICD-10-CM | POA: Diagnosis not present

## 2014-08-22 DIAGNOSIS — I10 Essential (primary) hypertension: Secondary | ICD-10-CM | POA: Diagnosis not present

## 2014-08-22 DIAGNOSIS — D509 Iron deficiency anemia, unspecified: Secondary | ICD-10-CM | POA: Diagnosis not present

## 2014-08-22 DIAGNOSIS — G20 Parkinson's disease: Secondary | ICD-10-CM | POA: Diagnosis not present

## 2014-08-22 DIAGNOSIS — I2781 Cor pulmonale (chronic): Secondary | ICD-10-CM | POA: Diagnosis not present

## 2014-08-22 DIAGNOSIS — R269 Unspecified abnormalities of gait and mobility: Secondary | ICD-10-CM | POA: Diagnosis not present

## 2014-08-23 DIAGNOSIS — D509 Iron deficiency anemia, unspecified: Secondary | ICD-10-CM | POA: Diagnosis not present

## 2014-08-23 DIAGNOSIS — I10 Essential (primary) hypertension: Secondary | ICD-10-CM | POA: Diagnosis not present

## 2014-08-23 DIAGNOSIS — R269 Unspecified abnormalities of gait and mobility: Secondary | ICD-10-CM | POA: Diagnosis not present

## 2014-08-23 DIAGNOSIS — G20 Parkinson's disease: Secondary | ICD-10-CM | POA: Diagnosis not present

## 2014-08-23 DIAGNOSIS — I2781 Cor pulmonale (chronic): Secondary | ICD-10-CM | POA: Diagnosis not present

## 2014-08-23 DIAGNOSIS — K529 Noninfective gastroenteritis and colitis, unspecified: Secondary | ICD-10-CM | POA: Diagnosis not present

## 2014-08-24 DIAGNOSIS — K529 Noninfective gastroenteritis and colitis, unspecified: Secondary | ICD-10-CM | POA: Diagnosis not present

## 2014-08-24 DIAGNOSIS — G20 Parkinson's disease: Secondary | ICD-10-CM | POA: Diagnosis not present

## 2014-08-24 DIAGNOSIS — I2781 Cor pulmonale (chronic): Secondary | ICD-10-CM | POA: Diagnosis not present

## 2014-08-24 DIAGNOSIS — R269 Unspecified abnormalities of gait and mobility: Secondary | ICD-10-CM | POA: Diagnosis not present

## 2014-08-24 DIAGNOSIS — I10 Essential (primary) hypertension: Secondary | ICD-10-CM | POA: Diagnosis not present

## 2014-08-24 DIAGNOSIS — D509 Iron deficiency anemia, unspecified: Secondary | ICD-10-CM | POA: Diagnosis not present

## 2014-08-25 ENCOUNTER — Ambulatory Visit (INDEPENDENT_AMBULATORY_CARE_PROVIDER_SITE_OTHER): Payer: Medicare Other | Admitting: Nurse Practitioner

## 2014-08-25 VITALS — BP 130/82 | HR 80 | Temp 97.6°F | Resp 20 | Ht 64.0 in | Wt 174.8 lb

## 2014-08-25 DIAGNOSIS — N2889 Other specified disorders of kidney and ureter: Secondary | ICD-10-CM

## 2014-08-25 DIAGNOSIS — G20 Parkinson's disease: Secondary | ICD-10-CM | POA: Diagnosis not present

## 2014-08-25 DIAGNOSIS — K529 Noninfective gastroenteritis and colitis, unspecified: Secondary | ICD-10-CM | POA: Diagnosis not present

## 2014-08-25 DIAGNOSIS — D509 Iron deficiency anemia, unspecified: Secondary | ICD-10-CM | POA: Diagnosis not present

## 2014-08-25 DIAGNOSIS — I10 Essential (primary) hypertension: Secondary | ICD-10-CM | POA: Diagnosis not present

## 2014-08-25 DIAGNOSIS — I5189 Other ill-defined heart diseases: Secondary | ICD-10-CM

## 2014-08-25 DIAGNOSIS — I2781 Cor pulmonale (chronic): Secondary | ICD-10-CM | POA: Diagnosis not present

## 2014-08-25 DIAGNOSIS — I519 Heart disease, unspecified: Secondary | ICD-10-CM

## 2014-08-25 DIAGNOSIS — R269 Unspecified abnormalities of gait and mobility: Secondary | ICD-10-CM | POA: Diagnosis not present

## 2014-08-25 NOTE — Progress Notes (Signed)
Patient ID: [name], female   DOB: Dec 28, 1940, 73 y.o.   MRN: [id number]    PCP: Hollace Kinnier, DO  Allergies  Allergen Reactions  . Codeine Other (See Comments)    unknown  . Penicillins Other (See Comments)    unknown    Chief Complaint  Patient presents with  . Hospitalization Follow-up    weak, more tremors     HPI: Patient is a 73 y.o. female seen in the office today to follow up hospitalization. Pt with PMH of Parkinson's since 1999, H/o large hiatal hernia on Endoscopy 11/25/13, HTN, prior CVA, H/o UGIB in 2013 secondary to Gastric ulcer, Diastolic CHF + Cor pulmonale, syncope, prior R Fem neck fracture, thyroidectomy for a nodule, and dyslipidemia. She went to the ED with nausea, vomiting, and diarrhea for past few days that was presumed to be viral enteritis. Was given IV fluids and advanced diet without difficult. Tolerating diet at home, has appetite but still not able to eat like she had before. Slowly improving. No nausea, vomiting or diarrhea at this time.   During hospitalization L inferior renal mass was found on CT.  Recommendation for further workup with MRI-- pt was unaware of this.    Review of Systems:  Review of Systems  Constitutional: Positive for appetite change and fatigue. Negative for activity change and unexpected weight change.       Fatigue and decrease appetite from hospitalization, overall this is improving  HENT: Negative for congestion and hearing loss.   Eyes: Negative.   Respiratory: Negative for cough and shortness of breath.   Cardiovascular: Negative for chest pain, palpitations and leg swelling.  Gastrointestinal: Negative for abdominal pain, diarrhea and constipation.  Genitourinary: Negative for dysuria and difficulty urinating.  Musculoskeletal: Negative for myalgias and arthralgias.  Skin: Negative for color change and wound.  Neurological: Positive for tremors. Negative for dizziness and weakness.  Psychiatric/Behavioral: Negative  for behavioral problems, confusion and agitation.    Past Medical History  Diagnosis Date  . Parkinson's disease   . Hypertension   . Arthritis   . Anemia   . Cellulitis of lower leg 12/24/2011  . Acute bronchitis   . Other abnormal blood chemistry   . Chronic systolic heart failure   . Acute on chronic systolic heart failure   . Cellulitis and abscess of leg, except foot   . Hypotension, unspecified   . Helicobacter pylori (H. pylori)   . Acute gastric ulcer with hemorrhage, without mention of obstruction    Past Surgical History  Procedure Laterality Date  . Hip arthroscopy w/ labral repair    . Thyroidectomy    . Esophagogastroduodenoscopy  09/26/2011    Procedure: ESOPHAGOGASTRODUODENOSCOPY (EGD);  Surgeon: Zenovia Jarred, MD;  Location: [facility] ENDOSCOPY;  Service: Gastroenterology;  Laterality: N/A;  To be done at bedside.  . Esophagogastroduodenoscopy N/A 09/30/2013    Procedure: ESOPHAGOGASTRODUODENOSCOPY (EGD);  Surgeon: [name], MD;  Location: [place];  Service: Endoscopy;  Laterality: N/A;  . Esophagogastroduodenoscopy N/A 11/25/2013    Procedure: ESOPHAGOGASTRODUODENOSCOPY (EGD);  Surgeon: [name], MD;  Location: [facility] ENDOSCOPY;  Service: Endoscopy;  Laterality: N/A;   Social History:   reports that she has never smoked. She has never used smokeless tobacco. She reports that she does not drink alcohol or use illicit drugs.  Family History  Problem Relation Age of Onset  . GER disease Mother   . Heart disease Father     Medications: Patient's Medications  New Prescriptions  No medications on file  Previous Medications   CARBIDOPA-LEVODOPA (SINEMET IR) 25-100 MG PER TABLET    Take 1.5 tablets by mouth 4 (four) times daily. 6am, 10am, 2pm and 6pm   FERROUS SULFATE (IRON) 325 (65 FE) MG TABS    Take 1 capsule by mouth daily.   FUROSEMIDE (LASIX) 20 MG TABLET    Take 20 mg by mouth 2 (two) times daily as needed for fluid.    HYDROCODONE-ACETAMINOPHEN  (NORCO/VICODIN) 5-325 MG PER TABLET    Take 1 tablet by mouth 3 (three) times daily as needed for severe pain. For neck pain   MULTIPLE VITAMINS-MINERALS (MULTIVITAMIN WITH MINERALS) TABLET    Take 1 tablet by mouth daily.   ONDANSETRON (ZOFRAN-ODT) 8 MG DISINTEGRATING TABLET    Take 8 mg by mouth 2 (two) times daily as needed.   PANTOPRAZOLE (PROTONIX) 40 MG TABLET    Take 1 tablet (40 mg total) by mouth 2 (two) times daily.   POLYETHYLENE GLYCOL POWDER (GLYCOLAX/MIRALAX) POWDER    Take 17 g by mouth 2 (two) times daily as needed.   PRAMIPEXOLE (MIRAPEX) 1.5 MG TABLET    Take 1 tablet (1.5 mg total) by mouth 3 (three) times daily.   SENNA (SENOKOT) 8.6 MG TABS TABLET    Take 2 tablets (17.2 mg total) by mouth daily.  Modified Medications   No medications on file  Discontinued Medications   No medications on file     Physical Exam:  Filed Vitals:   08/25/14 1456  BP: 130/82  Pulse: 80  Temp: 97.6 F (36.4 C)  TempSrc: Oral  Resp: 20  Height: 5\' 4"  (1.626 m)  Weight: 174 lb 12.8 oz (79.289 kg)  SpO2: 96%    Physical Exam  Constitutional: She is oriented to person, place, and time. She appears well-developed and well-nourished.  Neck: Normal range of motion. Neck supple.  Cardiovascular: Normal rate, regular rhythm and normal heart sounds.   Pulmonary/Chest: Effort normal and breath sounds normal.  Abdominal: Soft. Bowel sounds are normal. She exhibits no distension and no mass. There is no tenderness.  Musculoskeletal: Normal range of motion.  Neurological: She is alert and oriented to person, place, and time. She exhibits abnormal muscle tone. Coordination abnormal.  Skin: Skin is warm and dry.  Psychiatric: She has a normal mood and affect.    Labs reviewed: Basic Metabolic Panel:  Recent Labs  02/11/14 1202 07/21/14 1420 08/07/14 0043 08/08/14 0600  NA 142 143 140 139  K 4.3 4.4 4.2 4.0  CL 102 105 104 108  CO2 23 21 23 19   GLUCOSE 97 97 136* 82  BUN 15 21 20  9   CREATININE 0.77 1.36* 0.75 0.54  CALCIUM 9.2 8.9 8.4 8.1*  TSH 0.907  --   --   --    Liver Function Tests:  Recent Labs  11/09/13 1240  07/21/14 1420 08/07/14 0043 08/08/14 0600  AST 17  < > 16 17 17   ALT <5  < > 9 6 8   ALKPHOS 89  < > 73 71 58  BILITOT 0.3  < > 0.5 0.5 0.4  PROT 6.8  < > 6.4 6.1 5.2*  ALBUMIN 3.5  --   --  3.2* 2.6*  < > = values in this interval not displayed.  Recent Labs  11/09/13 1240  LIPASE 20   No results for input(s): AMMONIA in the last 8760 hours. CBC:  Recent Labs  07/21/14 1420 08/07/14 0043 08/08/14 0600 08/09/14 0631  WBC 8.2 13.5* 4.8 7.9  NEUTROABS 6.4 12.3*  --  6.1  HGB 11.9 12.6 11.5* 11.9*  HCT 36.8 40.3 36.9 37.6  MCV 86 87.0 88.3 87.9  PLT 159 163 123* 146*   Lipid Panel: No results for input(s): CHOL, HDL, LDLCALC, TRIG, CHOLHDL, LDLDIRECT in the last 8760 hours. TSH:  Recent Labs  02/11/14 1202  TSH 0.907   A1C: No results found for: HGBA1C   Assessment/Plan 1. Renal mass - MR Abdomen W Wo Contrast; Future  2. Enteritis -overall improved, no longer vomiting, occasional nausea, pt is keeping hydrated and slowly progressing diet - Basic metabolic panel  3. Diastolic dysfunction, grade 2 -stable and currently euvolemic  -discussed with PCP Dr Mariea Clonts regarding hospital recs on ACE and betablocker but due to risk factors such as orthostatics with parkinson and hx of ARF it was felt benefit did not out weight risk.  -pt conts on lasix at this time   4. Anemia, iron deficiency -will follow up CBC -cont on iron daily

## 2014-08-26 LAB — CBC
HEMATOCRIT: 35.3 % (ref 34.0–46.6)
Hemoglobin: 11.3 g/dL (ref 11.1–15.9)
MCH: 27 pg (ref 26.6–33.0)
MCHC: 32 g/dL (ref 31.5–35.7)
MCV: 84 fL (ref 79–97)
Platelets: 211 10*3/uL (ref 150–379)
RBC: 4.19 x10E6/uL (ref 3.77–5.28)
RDW: 15.8 % — AB (ref 12.3–15.4)
WBC: 7.3 10*3/uL (ref 3.4–10.8)

## 2014-08-26 LAB — BASIC METABOLIC PANEL
BUN/Creatinine Ratio: 20 (ref 11–26)
BUN: 15 mg/dL (ref 8–27)
CO2: 25 mmol/L (ref 18–29)
Calcium: 9.3 mg/dL (ref 8.7–10.3)
Chloride: 104 mmol/L (ref 97–108)
Creatinine, Ser: 0.75 mg/dL (ref 0.57–1.00)
GFR calc non Af Amer: 79 mL/min/{1.73_m2} (ref >59)
GFR, EST AFRICAN AMERICAN: 91 mL/min/{1.73_m2} (ref >59)
Glucose: 90 mg/dL (ref 65–99)
Potassium: 4.5 mmol/L (ref 3.5–5.2)
SODIUM: 142 mmol/L (ref 134–144)

## 2014-08-29 DIAGNOSIS — I2781 Cor pulmonale (chronic): Secondary | ICD-10-CM | POA: Diagnosis not present

## 2014-08-29 DIAGNOSIS — I10 Essential (primary) hypertension: Secondary | ICD-10-CM | POA: Diagnosis not present

## 2014-08-29 DIAGNOSIS — G20 Parkinson's disease: Secondary | ICD-10-CM | POA: Diagnosis not present

## 2014-08-29 DIAGNOSIS — D509 Iron deficiency anemia, unspecified: Secondary | ICD-10-CM | POA: Diagnosis not present

## 2014-08-29 DIAGNOSIS — R269 Unspecified abnormalities of gait and mobility: Secondary | ICD-10-CM | POA: Diagnosis not present

## 2014-08-29 DIAGNOSIS — K529 Noninfective gastroenteritis and colitis, unspecified: Secondary | ICD-10-CM | POA: Diagnosis not present

## 2014-08-30 DIAGNOSIS — K529 Noninfective gastroenteritis and colitis, unspecified: Secondary | ICD-10-CM | POA: Diagnosis not present

## 2014-08-30 DIAGNOSIS — G20 Parkinson's disease: Secondary | ICD-10-CM | POA: Diagnosis not present

## 2014-08-30 DIAGNOSIS — R269 Unspecified abnormalities of gait and mobility: Secondary | ICD-10-CM | POA: Diagnosis not present

## 2014-08-30 DIAGNOSIS — I10 Essential (primary) hypertension: Secondary | ICD-10-CM | POA: Diagnosis not present

## 2014-08-30 DIAGNOSIS — I2781 Cor pulmonale (chronic): Secondary | ICD-10-CM | POA: Diagnosis not present

## 2014-08-30 DIAGNOSIS — D509 Iron deficiency anemia, unspecified: Secondary | ICD-10-CM | POA: Diagnosis not present

## 2014-08-31 DIAGNOSIS — I10 Essential (primary) hypertension: Secondary | ICD-10-CM | POA: Diagnosis not present

## 2014-08-31 DIAGNOSIS — I2781 Cor pulmonale (chronic): Secondary | ICD-10-CM | POA: Diagnosis not present

## 2014-08-31 DIAGNOSIS — D509 Iron deficiency anemia, unspecified: Secondary | ICD-10-CM | POA: Diagnosis not present

## 2014-08-31 DIAGNOSIS — K529 Noninfective gastroenteritis and colitis, unspecified: Secondary | ICD-10-CM | POA: Diagnosis not present

## 2014-08-31 DIAGNOSIS — G20 Parkinson's disease: Secondary | ICD-10-CM | POA: Diagnosis not present

## 2014-08-31 DIAGNOSIS — R269 Unspecified abnormalities of gait and mobility: Secondary | ICD-10-CM | POA: Diagnosis not present

## 2014-09-01 DIAGNOSIS — I2781 Cor pulmonale (chronic): Secondary | ICD-10-CM | POA: Diagnosis not present

## 2014-09-01 DIAGNOSIS — K529 Noninfective gastroenteritis and colitis, unspecified: Secondary | ICD-10-CM | POA: Diagnosis not present

## 2014-09-01 DIAGNOSIS — I10 Essential (primary) hypertension: Secondary | ICD-10-CM | POA: Diagnosis not present

## 2014-09-01 DIAGNOSIS — D509 Iron deficiency anemia, unspecified: Secondary | ICD-10-CM | POA: Diagnosis not present

## 2014-09-01 DIAGNOSIS — R269 Unspecified abnormalities of gait and mobility: Secondary | ICD-10-CM | POA: Diagnosis not present

## 2014-09-01 DIAGNOSIS — G20 Parkinson's disease: Secondary | ICD-10-CM | POA: Diagnosis not present

## 2014-09-05 ENCOUNTER — Ambulatory Visit
Admission: RE | Admit: 2014-09-05 | Discharge: 2014-09-05 | Disposition: A | Discharge status: Home / Self Care | Payer: Medicare Other | Source: Ambulatory Visit | Attending: Nurse Practitioner | Admitting: Nurse Practitioner

## 2014-09-05 DIAGNOSIS — I10 Essential (primary) hypertension: Secondary | ICD-10-CM | POA: Diagnosis not present

## 2014-09-05 DIAGNOSIS — I2781 Cor pulmonale (chronic): Secondary | ICD-10-CM | POA: Diagnosis not present

## 2014-09-05 DIAGNOSIS — R269 Unspecified abnormalities of gait and mobility: Secondary | ICD-10-CM | POA: Diagnosis not present

## 2014-09-05 DIAGNOSIS — D509 Iron deficiency anemia, unspecified: Secondary | ICD-10-CM | POA: Diagnosis not present

## 2014-09-05 DIAGNOSIS — N2889 Other specified disorders of kidney and ureter: Secondary | ICD-10-CM

## 2014-09-05 DIAGNOSIS — K529 Noninfective gastroenteritis and colitis, unspecified: Secondary | ICD-10-CM | POA: Diagnosis not present

## 2014-09-05 DIAGNOSIS — G20 Parkinson's disease: Secondary | ICD-10-CM | POA: Diagnosis not present

## 2014-09-07 DIAGNOSIS — R269 Unspecified abnormalities of gait and mobility: Secondary | ICD-10-CM | POA: Diagnosis not present

## 2014-09-07 DIAGNOSIS — K529 Noninfective gastroenteritis and colitis, unspecified: Secondary | ICD-10-CM | POA: Diagnosis not present

## 2014-09-07 DIAGNOSIS — D509 Iron deficiency anemia, unspecified: Secondary | ICD-10-CM | POA: Diagnosis not present

## 2014-09-07 DIAGNOSIS — I10 Essential (primary) hypertension: Secondary | ICD-10-CM | POA: Diagnosis not present

## 2014-09-07 DIAGNOSIS — I2781 Cor pulmonale (chronic): Secondary | ICD-10-CM | POA: Diagnosis not present

## 2014-09-07 DIAGNOSIS — G20 Parkinson's disease: Secondary | ICD-10-CM | POA: Diagnosis not present

## 2014-09-12 DIAGNOSIS — I10 Essential (primary) hypertension: Secondary | ICD-10-CM | POA: Diagnosis not present

## 2014-09-12 DIAGNOSIS — G20 Parkinson's disease: Secondary | ICD-10-CM | POA: Diagnosis not present

## 2014-09-12 DIAGNOSIS — K529 Noninfective gastroenteritis and colitis, unspecified: Secondary | ICD-10-CM | POA: Diagnosis not present

## 2014-09-12 DIAGNOSIS — D509 Iron deficiency anemia, unspecified: Secondary | ICD-10-CM | POA: Diagnosis not present

## 2014-09-12 DIAGNOSIS — I2781 Cor pulmonale (chronic): Secondary | ICD-10-CM | POA: Diagnosis not present

## 2014-09-12 DIAGNOSIS — R269 Unspecified abnormalities of gait and mobility: Secondary | ICD-10-CM | POA: Diagnosis not present

## 2014-09-13 DIAGNOSIS — G20 Parkinson's disease: Secondary | ICD-10-CM | POA: Diagnosis not present

## 2014-09-13 DIAGNOSIS — I10 Essential (primary) hypertension: Secondary | ICD-10-CM | POA: Diagnosis not present

## 2014-09-13 DIAGNOSIS — R269 Unspecified abnormalities of gait and mobility: Secondary | ICD-10-CM | POA: Diagnosis not present

## 2014-09-13 DIAGNOSIS — D509 Iron deficiency anemia, unspecified: Secondary | ICD-10-CM | POA: Diagnosis not present

## 2014-09-13 DIAGNOSIS — K529 Noninfective gastroenteritis and colitis, unspecified: Secondary | ICD-10-CM | POA: Diagnosis not present

## 2014-09-13 DIAGNOSIS — I2781 Cor pulmonale (chronic): Secondary | ICD-10-CM | POA: Diagnosis not present

## 2014-09-21 DIAGNOSIS — G20 Parkinson's disease: Secondary | ICD-10-CM | POA: Diagnosis not present

## 2014-09-21 DIAGNOSIS — D509 Iron deficiency anemia, unspecified: Secondary | ICD-10-CM | POA: Diagnosis not present

## 2014-09-21 DIAGNOSIS — K529 Noninfective gastroenteritis and colitis, unspecified: Secondary | ICD-10-CM | POA: Diagnosis not present

## 2014-09-21 DIAGNOSIS — R269 Unspecified abnormalities of gait and mobility: Secondary | ICD-10-CM | POA: Diagnosis not present

## 2014-09-21 DIAGNOSIS — I10 Essential (primary) hypertension: Secondary | ICD-10-CM | POA: Diagnosis not present

## 2014-09-21 DIAGNOSIS — I2781 Cor pulmonale (chronic): Secondary | ICD-10-CM | POA: Diagnosis not present

## 2014-09-29 ENCOUNTER — Telehealth: Payer: Self-pay | Admitting: *Deleted

## 2014-09-29 NOTE — Telephone Encounter (Signed)
Patient called and stated that she could not do the MRI because of the [place]. She wanted to let you know.

## 2014-10-05 DIAGNOSIS — R269 Unspecified abnormalities of gait and mobility: Secondary | ICD-10-CM | POA: Diagnosis not present

## 2014-10-05 DIAGNOSIS — K529 Noninfective gastroenteritis and colitis, unspecified: Secondary | ICD-10-CM | POA: Diagnosis not present

## 2014-10-05 DIAGNOSIS — D509 Iron deficiency anemia, unspecified: Secondary | ICD-10-CM | POA: Diagnosis not present

## 2014-10-05 DIAGNOSIS — I10 Essential (primary) hypertension: Secondary | ICD-10-CM | POA: Diagnosis not present

## 2014-10-05 DIAGNOSIS — I2781 Cor pulmonale (chronic): Secondary | ICD-10-CM | POA: Diagnosis not present

## 2014-10-05 DIAGNOSIS — G20 Parkinson's disease: Secondary | ICD-10-CM | POA: Diagnosis not present

## 2014-10-10 ENCOUNTER — Ambulatory Visit: Payer: Medicare Other | Admitting: Internal Medicine

## 2014-10-17 ENCOUNTER — Other Ambulatory Visit: Payer: Self-pay | Admitting: Nurse Practitioner

## 2014-10-17 NOTE — Telephone Encounter (Signed)
Has appt with Dr Rexene Alberts in Feb

## 2014-10-20 ENCOUNTER — Ambulatory Visit: Payer: Medicare Other | Admitting: Internal Medicine

## 2014-11-01 ENCOUNTER — Encounter: Payer: Self-pay | Admitting: Neurology

## 2014-11-01 ENCOUNTER — Ambulatory Visit (INDEPENDENT_AMBULATORY_CARE_PROVIDER_SITE_OTHER): Payer: Medicare Other | Admitting: Neurology

## 2014-11-01 VITALS — BP 129/68 | HR 77 | Temp 97.2°F

## 2014-11-01 DIAGNOSIS — Z9181 History of falling: Secondary | ICD-10-CM | POA: Diagnosis not present

## 2014-11-01 DIAGNOSIS — G249 Dystonia, unspecified: Secondary | ICD-10-CM | POA: Diagnosis not present

## 2014-11-01 DIAGNOSIS — G20 Parkinson's disease: Secondary | ICD-10-CM | POA: Diagnosis not present

## 2014-11-01 MED ORDER — PRAMIPEXOLE DIHYDROCHLORIDE 1.5 MG PO TABS
1.5000 mg | ORAL_TABLET | Freq: Three times a day (TID) | ORAL | Status: DC
Start: 2014-11-01 — End: 2014-12-16

## 2014-11-01 MED ORDER — CARBIDOPA-LEVODOPA 25-100 MG PO TABS
ORAL_TABLET | ORAL | Status: DC
Start: 2014-11-01 — End: 2014-12-16

## 2014-11-01 NOTE — Patient Instructions (Signed)
We will keep you on Sinemet and Mirapex at the current doses.  I would like to see you back in 4 months.

## 2014-11-01 NOTE — Progress Notes (Deleted)
Subjective:    Patient ID: [name] is a 74 y.o. female.  HPI {Common ambulatory SmartLinks:[id number]}  Review of Systems  Objective:  Neurologic Exam  Physical Exam  Assessment:   ***  Plan:   ***

## 2014-11-01 NOTE — Progress Notes (Signed)
Subjective:    [name] ID: [name] is a 74 y.o. female.  HPI     Interim history:   [name] is a 74 year old right-handed woman with an underlying medical history of hypertension, arthritis, anemia, cellulitis, chronic systolic heart failure, peptic ulcer disease, and chronic low back pain with history of T12 compression fracture, status post kyphoplasty in October 2010, who presents for follow-up consultation of her Parkinson's disease, complicated by motor complications, motor fluctuations and falls.  [name] is unaccompanied today and was dropped off by transportation. This is her first visit with me and [name] previously saw Dr. Jim Like on 02/28/2014, at which time [name] was kept on levodopa at [name] current dose and Mirapex at [name] current dose. Prior to that [name] used to see Dr. Krista Blue (05/25/13) and has also seen our nurse practitioner, Cecille Rubin, last on 01/28/14. Her symptoms date back to 1999 when [name] first noticed L hand stiffness, fine motor dyscontrol and gait changes. Today, [name] reports, feeling worse with more freezing. [name] fell last night but thankfully did not injure herself. [name] from [facility] to 08/10/14 for N/V/D.  [name] lives alone. [name] has 5 brothers and 1 sister, 1 brother lives in [place] but otherwise her siblings are close by. One brother brings her supper every night. [name] gets Meals on Wheels. [name] has a call alert button. [name] has no family history of Parkinson's disease. [name] uses a 2 wheeled walker at [name] house. [name] has generalized dyskinesias. [name] may not drink enough water. [name] has lower extremity swelling but this has improved. Her short-term memory is getting worse [name] feels. [name] denies any hallucinations.  Her Past Medical History Is Significant For: Past Medical History  Diagnosis Date  . Parkinson's disease   . Hypertension   . Arthritis   . Anemia   . Cellulitis of lower leg 12/24/2011  . Acute bronchitis   . Other abnormal blood  chemistry   . Chronic systolic heart failure   . Acute on chronic systolic heart failure   . Cellulitis and abscess of leg, except foot   . Hypotension, unspecified   . Helicobacter pylori (H. pylori)   . Acute gastric ulcer with hemorrhage, without mention of obstruction     Her Past Surgical History Is Significant For: Past Surgical History  Procedure Laterality Date  . Hip arthroscopy w/ labral repair    . Thyroidectomy    . Esophagogastroduodenoscopy  09/26/2011    Procedure: ESOPHAGOGASTRODUODENOSCOPY (EGD);  Surgeon: Zenovia Jarred, MD;  Location: [facility] ENDOSCOPY;  Service: Gastroenterology;  Laterality: N/A;  To be done at bedside.  . Esophagogastroduodenoscopy N/A 09/30/2013    Procedure: ESOPHAGOGASTRODUODENOSCOPY (EGD);  Surgeon: [name], MD;  Location: [facility];  Service: Endoscopy;  Laterality: N/A;  . Esophagogastroduodenoscopy N/A 11/25/2013    Procedure: ESOPHAGOGASTRODUODENOSCOPY (EGD);  Surgeon: [name], MD;  Location: [facility] ENDOSCOPY;  Service: Endoscopy;  Laterality: N/A;    Her Family History Is Significant For: Family History  Problem Relation Age of Onset  . GER disease Mother   . Heart disease Father     Her Social History Is Significant For: History   Social History  . Marital Status: Single    Spouse Name: N/A    Number of Children: 0  . Years of Education: 12   Occupational History  .      Retired   Social History Main Topics  . Smoking status: Never Smoker   .  Smokeless tobacco: Never Used  . Alcohol Use: No  . Drug Use: No  . Sexual Activity: No   Other Topics Concern  . None   Social History Narrative   [name] lives at home alone. [name] is retired. [name] has high school education.   Caffeine- one daily.   Right handed.    Her Allergies Are:  Allergies  Allergen Reactions  . Codeine Other (See Comments)    unknown  . Penicillins Other (See Comments)    unknown  :   Her Current Medications Are:  Outpatient  Encounter Prescriptions as of 11/01/2014  Medication Sig  . carbidopa-levodopa (SINEMET IR) 25-100 MG per tablet TAKE ONE AND ONE-HALF TABLETS BY MOUTH 4 TIMES DAILY. TAKE AT 6 AM, 10AM, 2PM AND 6PM  . Ferrous Sulfate (IRON) 325 (65 FE) MG TABS Take 1 capsule by mouth daily.  . furosemide (LASIX) 20 MG tablet Take 20 mg by mouth 2 (two) times daily as needed for fluid.   Marland Kitchen HYDROcodone-acetaminophen (NORCO/VICODIN) 5-325 MG per tablet Take 1 tablet by mouth 3 (three) times daily as needed for severe pain. For neck pain  . Multiple Vitamins-Minerals (MULTIVITAMIN WITH MINERALS) tablet Take 1 tablet by mouth daily.  . ondansetron (ZOFRAN-ODT) 8 MG disintegrating tablet Take 8 mg by mouth 2 (two) times daily as needed.  . pantoprazole (PROTONIX) 40 MG tablet Take 1 tablet (40 mg total) by mouth 2 (two) times daily.  . polyethylene glycol powder (GLYCOLAX/MIRALAX) powder Take 17 g by mouth 2 (two) times daily as needed. ([name] taking differently: Take 17 g by mouth 2 (two) times daily as needed for mild constipation. )  . pramipexole (MIRAPEX) 1.5 MG tablet Take 1 tablet (1.5 mg total) by mouth 3 (three) times daily.  Marland Kitchen senna (SENOKOT) 8.6 MG TABS tablet Take 2 tablets (17.2 mg total) by mouth daily. ([name] taking differently: Take 1-2 tablets by mouth daily as needed for mild constipation. )  :  Review of Systems:  Out of a complete 14 point review of systems, all are reviewed and negative with [name] exception of these symptoms as listed below:   Review of Systems  All other systems reviewed and are negative.   Objective:  Neurologic Exam  Physical Exam Physical Examination:   Filed Vitals:   11/01/14 1258  BP: 129/68  Pulse: 77  Temp: 97.2 F (36.2 C)    General Examination: [name] is a very pleasant 74 y.o. female in no acute distress. [name] is situated in her wheelchair. [name] is frail appearing.  HEENT: Normocephalic, atraumatic, pupils are equal, round and reactive to light and  accommodation. Funduscopic exam is normal with sharp disc margins noted. Extraocular tracking shows moderate saccadic breakdown without nystagmus noted. There is limitation to upper gaze. There is mild decrease in eye blink rate. Hearing is intact. Face is symmetric with mild facial masking and normal facial sensation. There is no lip, neck or jaw tremor. Neck is mildly rigid with intact passive ROM. There are no carotid bruits on auscultation. Oropharynx exam reveals moderate mouth dryness. No significant airway crowding is noted. Mallampati is class II. Tongue protrudes centrally and palate elevates symmetrically.   There is no drooling.   Chest: is clear to auscultation without wheezing, rhonchi or crackles noted.  Heart: sounds are regular and normal without murmurs, rubs or gallops noted.   Abdomen: is soft, non-tender and non-distended with normal bowel sounds appreciated on auscultation.  Extremities: There is 1+ pitting edema in [name] distal  lower extremities bilaterally. Chronic stasis-like changes are noted in [name] distal legs bilaterally. There are spider veins.  Skin: is warm and dry with no trophic changes noted. Age-related changes are noted on [name] skin.   Musculoskeletal: exam reveals no obvious joint deformities, tenderness, joint swelling or erythema, except for arthritic changes in her hands.  Neurologically:  Mental status: [name] is awake and alert, paying good  attention. [name] is able to completely provide [name] history.  [name] is oriented to: person, place, situation, day of week, month of year and year. Her memory, attention, language and knowledge are mildly impaired. [name] has a mild degree of bradyphrenia. Speech is moderately hypophonic with minimal dysarthria noted. Mood is congruent and affect is normal.   Cranial nerves are as described above under HEENT exam. [name] is leaning to [name] right. It is difficult to examine her shoulder height and shoulder strength. Motor exam:  Normal bulk, and strength for age is noted. There are mild to moderate generalized dyskinesias noted. Tone is mildly rigid with absence of cogwheeling. There is overall moderate bradykinesia. There is no drift or rebound.  There is no resting tremor but [name] has a mild left upper extremity postural tremor.  Romberg is not tested as I did not feel [name] was safe to stand or walk. [name] did not bring a walker.  Reflexes are 1+ in [name] upper extremities and trace in [name] lower extremities.   Fine motor skills exam: Finger taps are moderately impaired on [name] right and moderately impaired on [name] left. Hand movements are mildly impaired on [name] right and moderately impaired on [name] left. RAP (rapid alternating patting) is moderately impaired on [name] right and moderately impaired on [name] left. Foot taps are moderately impaired on [name] right and moderately impaired on [name] left. Foot agility (in [name] form of heel stomping) is mildly impaired on [name] right and moderately impaired on [name] left.    Cerebellar testing shows no dysmetria or intention tremor on finger to nose testing. Heel to shin is unremarkable bilaterally. There is no truncal or gait ataxia.   Sensory exam is intact to light touch, pinprick, vibration, temperature sense and proprioception in [name] upper and lower extremities.   Gait, station and balance: I did not have her walk or stand for me today as [name] is at fall risk and [name] did not have her walker with her today. [name] is leaning to [name] right in her wheelchair.   Assessment and Plan:   In summary, KAYSA ROULHAC is a very pleasant 74 y.o.-year old female with an underlying medical history of hypertension, arthritis, anemia, cellulitis, chronic systolic heart failure and cor pulmonale, peptic ulcer disease, and chronic low back pain with history of T12 compression fracture, status post kyphoplasty in October 2010, who presents for follow-up consultation of her parkinsonism. Her history and physical exam are in  keeping with, left-sided predominant PD, complicated by dyskinesias, overall frailty, lower extremity swelling, and recent fall. [name] reports no injury after her fall yesterday. [name] has fallen in [name] past. [name] reports more freezing. [name] is at risk for worsening of her dyskinesias with any increase in her anti-Parkinson's medication. Therefore, I suggested we continue with her Sinemet and Mirapex at [name] current doses which is Sinemet 25-100 milligrams strength, 1-1/2 pills 4 times a day and Mirapex 1.5 mg strength one pill 3 times a day. I renewed her prescriptions today and asked her to follow-up with me in about 4 months, sooner if need  be. [name] is encouraged to drink more water to keep herself better hydrated. [name] is advised to strictly use her walker as directed. [name] had some physical therapy after [name] was discharged from [name] hospital in November. [name] felt that was helpful. We can reconsider physical therapy in [name] near future. I answered all her questions today and [name] was in agreement with [name] above outlined plan.

## 2014-11-21 ENCOUNTER — Ambulatory Visit: Payer: Medicare Other | Admitting: Internal Medicine

## 2014-12-05 ENCOUNTER — Ambulatory Visit: Payer: Self-pay | Admitting: Internal Medicine

## 2014-12-07 ENCOUNTER — Encounter (HOSPITAL_COMMUNITY): Payer: Self-pay | Admitting: Emergency Medicine

## 2014-12-07 ENCOUNTER — Inpatient Hospital Stay (HOSPITAL_COMMUNITY)
Admission: EM | Admit: 2014-12-07 | Discharge: 2014-12-08 | DRG: 603 | Disposition: A | Discharge status: Rehabilitation Facility | Payer: Medicare Other | Attending: Internal Medicine | Admitting: Internal Medicine

## 2014-12-07 DIAGNOSIS — N390 Urinary tract infection, site not specified: Secondary | ICD-10-CM | POA: Diagnosis present

## 2014-12-07 DIAGNOSIS — R531 Weakness: Secondary | ICD-10-CM | POA: Diagnosis not present

## 2014-12-07 DIAGNOSIS — L03119 Cellulitis of unspecified part of limb: Secondary | ICD-10-CM

## 2014-12-07 DIAGNOSIS — I10 Essential (primary) hypertension: Secondary | ICD-10-CM | POA: Diagnosis present

## 2014-12-07 DIAGNOSIS — I519 Heart disease, unspecified: Secondary | ICD-10-CM

## 2014-12-07 DIAGNOSIS — M1990 Unspecified osteoarthritis, unspecified site: Secondary | ICD-10-CM | POA: Diagnosis present

## 2014-12-07 DIAGNOSIS — N281 Cyst of kidney, acquired: Secondary | ICD-10-CM | POA: Diagnosis not present

## 2014-12-07 DIAGNOSIS — L03115 Cellulitis of right lower limb: Secondary | ICD-10-CM

## 2014-12-07 DIAGNOSIS — G20 Parkinson's disease: Secondary | ICD-10-CM | POA: Diagnosis not present

## 2014-12-07 DIAGNOSIS — D638 Anemia in other chronic diseases classified elsewhere: Secondary | ICD-10-CM | POA: Diagnosis present

## 2014-12-07 DIAGNOSIS — I5189 Other ill-defined heart diseases: Secondary | ICD-10-CM | POA: Diagnosis present

## 2014-12-07 DIAGNOSIS — Z79899 Other long term (current) drug therapy: Secondary | ICD-10-CM | POA: Diagnosis not present

## 2014-12-07 DIAGNOSIS — L03116 Cellulitis of left lower limb: Principal | ICD-10-CM | POA: Diagnosis present

## 2014-12-07 DIAGNOSIS — N2889 Other specified disorders of kidney and ureter: Secondary | ICD-10-CM

## 2014-12-07 DIAGNOSIS — I5042 Chronic combined systolic (congestive) and diastolic (congestive) heart failure: Secondary | ICD-10-CM | POA: Diagnosis present

## 2014-12-07 DIAGNOSIS — M79606 Pain in leg, unspecified: Secondary | ICD-10-CM | POA: Diagnosis not present

## 2014-12-07 LAB — URINALYSIS, ROUTINE W REFLEX MICROSCOPIC
Bilirubin Urine: NEGATIVE
Glucose, UA: NEGATIVE mg/dL
HGB URINE DIPSTICK: NEGATIVE
Ketones, ur: NEGATIVE mg/dL
Nitrite: NEGATIVE
PROTEIN: NEGATIVE mg/dL
SPECIFIC GRAVITY, URINE: 1.019 (ref 1.005–1.030)
Urobilinogen, UA: 1 mg/dL (ref 0.0–1.0)
pH: 6.5 (ref 5.0–8.0)

## 2014-12-07 LAB — COMPREHENSIVE METABOLIC PANEL
ALBUMIN: 3.3 g/dL — AB (ref 3.5–5.2)
ALK PHOS: 69 U/L (ref 39–117)
ALT: 18 U/L (ref 0–35)
ANION GAP: 6 (ref 5–15)
AST: 26 U/L (ref 0–37)
BUN: 12 mg/dL (ref 6–23)
CHLORIDE: 107 mmol/L (ref 96–112)
CO2: 26 mmol/L (ref 19–32)
CREATININE: 0.71 mg/dL (ref 0.50–1.10)
Calcium: 8.4 mg/dL (ref 8.4–10.5)
GFR calc non Af Amer: 83 mL/min — ABNORMAL LOW (ref >90)
GLUCOSE: 107 mg/dL — AB (ref 70–99)
POTASSIUM: 3.5 mmol/L (ref 3.5–5.1)
Sodium: 139 mmol/L (ref 135–145)
Total Bilirubin: 1.2 mg/dL (ref 0.3–1.2)
Total Protein: 5.8 g/dL — ABNORMAL LOW (ref 6.0–8.3)

## 2014-12-07 LAB — CBC WITH DIFFERENTIAL/PLATELET
BASOS ABS: 0 10*3/uL (ref 0.0–0.1)
Band Neutrophils: 0 % (ref 0–10)
Basophils Absolute: 0 10*3/uL (ref 0.0–0.1)
Basophils Relative: 0 % (ref 0–1)
Basophils Relative: 0 % (ref 0–1)
Blasts: 0 %
EOS PCT: 2 % (ref 0–5)
Eosinophils Absolute: 0.2 10*3/uL (ref 0.0–0.7)
Eosinophils Absolute: 0.2 10*3/uL (ref 0.0–0.7)
Eosinophils Relative: 2 % (ref 0–5)
HCT: 36.9 % (ref 36.0–46.0)
HCT: 36.7 % (ref 36.0–46.0)
Hemoglobin: 11.6 g/dL — ABNORMAL LOW (ref 12.0–15.0)
Hemoglobin: 11.7 g/dL — ABNORMAL LOW (ref 12.0–15.0)
LYMPHS PCT: 13 % (ref 12–46)
LYMPHS PCT: 10 % — AB (ref 12–46)
Lymphs Abs: 1.2 10*3/uL (ref 0.7–4.0)
Lymphs Abs: 0.9 10*3/uL (ref 0.7–4.0)
MCH: 25.3 pg — ABNORMAL LOW (ref 26.0–34.0)
MCH: 25.6 pg — ABNORMAL LOW (ref 26.0–34.0)
MCHC: 31.4 g/dL (ref 30.0–36.0)
MCHC: 31.9 g/dL (ref 30.0–36.0)
MCV: 80.6 fL (ref 78.0–100.0)
MCV: 80.3 fL (ref 78.0–100.0)
MONO ABS: 0.8 10*3/uL (ref 0.1–1.0)
MYELOCYTES: 0 %
Metamyelocytes Relative: 0 %
Monocytes Absolute: 0.8 10*3/uL (ref 0.1–1.0)
Monocytes Relative: 9 % (ref 3–12)
Monocytes Relative: 8 % (ref 3–12)
NEUTROS ABS: 7.7 10*3/uL (ref 1.7–7.7)
NEUTROS PCT: 76 % (ref 43–77)
Neutro Abs: 6.8 10*3/uL (ref 1.7–7.7)
Neutrophils Relative %: 80 % — ABNORMAL HIGH (ref 43–77)
PLATELETS: 149 10*3/uL — AB (ref 150–400)
Platelets: 153 10*3/uL (ref 150–400)
Promyelocytes Absolute: 0 %
RBC: 4.58 MIL/uL (ref 3.87–5.11)
RBC: 4.57 MIL/uL (ref 3.87–5.11)
RDW: 17.7 % — ABNORMAL HIGH (ref 11.5–15.5)
RDW: 17.6 % — ABNORMAL HIGH (ref 11.5–15.5)
WBC: 9 10*3/uL (ref 4.0–10.5)
WBC: 9.6 10*3/uL (ref 4.0–10.5)
nRBC: 0 /100 WBC

## 2014-12-07 LAB — BASIC METABOLIC PANEL
ANION GAP: 6 (ref 5–15)
BUN: 16 mg/dL (ref 6–23)
CO2: 26 mmol/L (ref 19–32)
Calcium: 8.8 mg/dL (ref 8.4–10.5)
Chloride: 109 mmol/L (ref 96–112)
Creatinine, Ser: 0.75 mg/dL (ref 0.50–1.10)
GFR, EST NON AFRICAN AMERICAN: 81 mL/min — AB (ref >90)
Glucose, Bld: 102 mg/dL — ABNORMAL HIGH (ref 70–99)
POTASSIUM: 3.8 mmol/L (ref 3.5–5.1)
SODIUM: 141 mmol/L (ref 135–145)

## 2014-12-07 LAB — URINE MICROSCOPIC-ADD ON

## 2014-12-07 LAB — I-STAT CG4 LACTIC ACID, ED: LACTIC ACID, VENOUS: 0.56 mmol/L (ref 0.5–2.0)

## 2014-12-07 MED ORDER — ACETAMINOPHEN 325 MG PO TABS: 650.0000 mg | ORAL_TABLET | Freq: Four times a day (QID) | ORAL | Status: DC | PRN

## 2014-12-07 MED ORDER — PRAMIPEXOLE DIHYDROCHLORIDE 1.5 MG PO TABS
1.5000 mg | ORAL_TABLET | Freq: Three times a day (TID) | ORAL | Status: DC
  Administered 2014-12-07 – 2014-12-08 (×4): 1.5 mg via ORAL
  Filled 2014-12-07 (×9): qty 1

## 2014-12-07 MED ORDER — LORAZEPAM 0.5 MG PO TABS
0.5000 mg | ORAL_TABLET | Freq: Two times a day (BID) | ORAL | Status: DC | PRN
  Administered 2014-12-07 – 2014-12-08 (×2): 0.5 mg via ORAL
  Filled 2014-12-07 (×2): qty 1

## 2014-12-07 MED ORDER — ONDANSETRON HCL 4 MG PO TABS: 4.0000 mg | ORAL_TABLET | Freq: Four times a day (QID) | ORAL | Status: DC | PRN

## 2014-12-07 MED ORDER — OXYCODONE HCL 5 MG PO TABS
5.0000 mg | ORAL_TABLET | ORAL | Status: DC | PRN
  Administered 2014-12-07: 5 mg via ORAL
  Filled 2014-12-07: qty 1

## 2014-12-07 MED ORDER — SODIUM CHLORIDE 0.9 % IJ SOLN
3.0000 mL | Freq: Two times a day (BID) | INTRAMUSCULAR | Status: DC
  Administered 2014-12-07 – 2014-12-08 (×2): 3 mL via INTRAVENOUS

## 2014-12-07 MED ORDER — CARBIDOPA-LEVODOPA 25-100 MG PO TABS
1.5000 | ORAL_TABLET | ORAL | Status: DC
  Administered 2014-12-07 – 2014-12-08 (×6): 1.5 via ORAL
  Filled 2014-12-07: qty 1
  Filled 2014-12-07: qty 2
  Filled 2014-12-07 (×3): qty 1
  Filled 2014-12-07: qty 2
  Filled 2014-12-07 (×2): qty 1

## 2014-12-07 MED ORDER — FERROUS SULFATE 325 (65 FE) MG PO TABS
325.0000 mg | ORAL_TABLET | Freq: Every day | ORAL | Status: DC
  Administered 2014-12-07 – 2014-12-08 (×2): 325 mg via ORAL
  Filled 2014-12-07 (×2): qty 1

## 2014-12-07 MED ORDER — ACETAMINOPHEN 650 MG RE SUPP: 650.0000 mg | Freq: Four times a day (QID) | RECTAL | Status: DC | PRN

## 2014-12-07 MED ORDER — FUROSEMIDE 20 MG PO TABS: 20.0000 mg | ORAL_TABLET | Freq: Two times a day (BID) | ORAL | Status: DC | PRN

## 2014-12-07 MED ORDER — SODIUM CHLORIDE 0.9 % IV BOLUS (SEPSIS)
500.0000 mL | Freq: Once | INTRAVENOUS | Status: AC
  Administered 2014-12-07: 500 mL via INTRAVENOUS

## 2014-12-07 MED ORDER — ADULT MULTIVITAMIN W/MINERALS CH
1.0000 | ORAL_TABLET | Freq: Every day | ORAL | Status: DC
  Administered 2014-12-07 – 2014-12-08 (×2): 1 via ORAL
  Filled 2014-12-07 (×3): qty 1

## 2014-12-07 MED ORDER — VANCOMYCIN HCL 10 G IV SOLR
1500.0000 mg | Freq: Once | INTRAVENOUS | Status: AC
  Administered 2014-12-07: 1500 mg via INTRAVENOUS
  Filled 2014-12-07: qty 1500

## 2014-12-07 MED ORDER — VANCOMYCIN HCL IN DEXTROSE 750-5 MG/150ML-% IV SOLN
750.0000 mg | Freq: Two times a day (BID) | INTRAVENOUS | Status: DC
  Administered 2014-12-07 – 2014-12-08 (×2): 750 mg via INTRAVENOUS
  Filled 2014-12-07 (×3): qty 150

## 2014-12-07 MED ORDER — ONDANSETRON HCL 4 MG/2ML IJ SOLN: 4.0000 mg | Freq: Four times a day (QID) | INTRAMUSCULAR | Status: DC | PRN

## 2014-12-07 NOTE — ED Provider Notes (Signed)
CSN: [id number]     Arrival date & time 12/07/14  0157 History  This chart was scribed for [name], MD by Delphia Grates, ED Scribe. This patient was seen in room B15C/B15C and the patient's care was started at 3:38 AM.     Chief Complaint  Patient presents with  . Leg Pain  . Back Pain     The history is provided by the patient. No language interpreter was used.     HPI Comments: [name] is a 74 y.o. female, with history of Parkinson's disease, arthritis, and cellulitis of the lower leg, brought in by ambulance, who presents to the Emergency Department complaining of constant, 6/10, bilateral calf pain, right worse than left for the past 1.5 weeks, that has gotten worse today. There is associated redness to the BLE. Patient has not taken anything for symptom relief. She denies history of prior similar episodes. No swelling noted. She denies fever, chills, nausea, vomiting, or any other symptoms. Patient resides alone.   Past Medical History  Diagnosis Date  . Parkinson's disease   . Hypertension   . Arthritis   . Anemia   . Cellulitis of lower leg 12/24/2011  . Acute bronchitis   . Other abnormal blood chemistry   . Chronic systolic heart failure   . Acute on chronic systolic heart failure   . Cellulitis and abscess of leg, except foot   . Hypotension, unspecified   . Helicobacter pylori (H. pylori)   . Acute gastric ulcer with hemorrhage, without mention of obstruction    Past Surgical History  Procedure Laterality Date  . Hip arthroscopy w/ labral repair    . Thyroidectomy    . Esophagogastroduodenoscopy  09/26/2011    Procedure: ESOPHAGOGASTRODUODENOSCOPY (EGD);  Surgeon: Zenovia Jarred, MD;  Location: [facility] ENDOSCOPY;  Service: Gastroenterology;  Laterality: N/A;  To be done at bedside.  . Esophagogastroduodenoscopy N/A 09/30/2013    Procedure: ESOPHAGOGASTRODUODENOSCOPY (EGD);  Surgeon: [name], MD;  Location: [facility];  Service: Endoscopy;  Laterality: N/A;   . Esophagogastroduodenoscopy N/A 11/25/2013    Procedure: ESOPHAGOGASTRODUODENOSCOPY (EGD);  Surgeon: [name], MD;  Location: [facility] ENDOSCOPY;  Service: Endoscopy;  Laterality: N/A;   Family History  Problem Relation Age of Onset  . GER disease Mother   . Heart disease Father    History  Substance Use Topics  . Smoking status: Never Smoker   . Smokeless tobacco: Never Used  . Alcohol Use: No   OB History    No data available     Review of Systems   A complete 10 system review of systems was obtained and all systems are negative except as noted in the HPI and PMH.    Allergies  Codeine and Penicillins  Home Medications   Prior to Admission medications   Medication Sig Start Date End Date Taking? Authorizing Provider  carbidopa-levodopa (SINEMET IR) 25-100 MG per tablet TAKE ONE AND ONE-HALF TABLETS BY MOUTH 4 TIMES DAILY. TAKE AT 6 AM, 10AM, 2PM AND 6PM 11/01/14  Yes Star Age, MD  Ferrous Sulfate (IRON) 325 (65 FE) MG TABS Take 1 capsule by mouth daily. 12/23/13  Yes Jerene Bears, MD  furosemide (LASIX) 20 MG tablet Take 20 mg by mouth 2 (two) times daily as needed for fluid.    Yes Historical Provider, MD  HYDROcodone-acetaminophen (NORCO/VICODIN) 5-325 MG per tablet Take 1 tablet by mouth 3 (three) times daily as needed for severe pain. For neck pain Patient not taking:  Reported on 12/07/2014 02/11/14   [name] L Reed, DO  MAGNESIUM PO Take 1 tablet by mouth daily.   Yes Historical Provider, MD  Multiple Vitamins-Minerals (MULTIVITAMIN WITH MINERALS) tablet Take 1 tablet by mouth daily.   Yes Historical Provider, MD  pantoprazole (PROTONIX) 40 MG tablet Take 1 tablet (40 mg total) by mouth 2 (two) times daily. Patient not taking: Reported on 12/07/2014 12/28/13   Jerene Bears, MD  polyethylene glycol powder (GLYCOLAX/MIRALAX) powder Take 17 g by mouth 2 (two) times daily as needed. Patient not taking: Reported on 12/07/2014 04/18/14   [name] L Reed, DO  pramipexole (MIRAPEX)  1.5 MG tablet Take 1 tablet (1.5 mg total) by mouth 3 (three) times daily. 11/01/14  Yes Star Age, MD  senna (SENOKOT) 8.6 MG TABS tablet Take 2 tablets (17.2 mg total) by mouth daily. Patient not taking: Reported on 12/07/2014 04/18/14   Gayland Curry, DO   Triage Vitals: BP 137/60 mmHg  Pulse 80  Temp(Src) 98 F (36.7 C) (Oral)  Resp 18  Ht 5\' 4"  (1.626 m)  Wt 174 lb (78.926 kg)  BMI 29.85 kg/m2  SpO2 95%  Physical Exam  Constitutional: She is oriented to person, place, and time. She appears well-developed and well-nourished. No distress.  HENT:  Head: Normocephalic and atraumatic.  Nose: Nose normal.  Mouth/Throat: Oropharynx is clear and moist. No oropharyngeal exudate.  Eyes: Conjunctivae and EOM are normal. Pupils are equal, round, and reactive to light. No scleral icterus.  Neck: Normal range of motion. Neck supple. No JVD present. No tracheal deviation present. No thyromegaly present.  Cardiovascular: Normal rate, regular rhythm and normal heart sounds.  Exam reveals no gallop and no friction rub.   No murmur heard. Pulmonary/Chest: Effort normal and breath sounds normal. No respiratory distress. She has no wheezes. She exhibits no tenderness.  Abdominal: Soft. Bowel sounds are normal. She exhibits no distension and no mass. There is no tenderness. There is no rebound and no guarding. A hernia is present.  Periumbilical hernia, easily reducible.  Musculoskeletal: Normal range of motion. She exhibits tenderness. She exhibits no edema.  Erythema to bilateral lower extremities from the ankles to the proximal tibia. There is warmth and tenderness and mild tenderness to palpation.  Lymphadenopathy:    She has no cervical adenopathy.  Neurological: She is alert and oriented to person, place, and time. No cranial nerve deficit. She exhibits normal muscle tone.  Skin: Skin is warm and dry. No rash noted. No pallor.  Nursing note and vitals reviewed.   ED Course  Procedures  (including critical care time)  DIAGNOSTIC STUDIES: Oxygen Saturation is 95% on room air, adequate by my interpretation.    COORDINATION OF CARE: At [phone] Discussed treatment plan with patient which includes labs and ABX. Patient agrees.   Labs Review Labs Reviewed  CBC WITH DIFFERENTIAL/PLATELET - Abnormal; Notable for the following:    Hemoglobin 11.6 (*)    MCH 25.3 (*)    RDW 17.7 (*)    All other components within normal limits  BASIC METABOLIC PANEL - Abnormal; Notable for the following:    Glucose, Bld 102 (*)    GFR calc non Af Amer 81 (*)    All other components within normal limits  CULTURE, BLOOD (ROUTINE X 2)  CULTURE, BLOOD (ROUTINE X 2)  I-STAT CG4 LACTIC ACID, ED    Imaging Review No results found.   EKG Interpretation None      MDM   Final  diagnoses:  None    Patient presents to the emergency department for bilateral leg pain.  Physical exam is consistent with cellulitis. Blood cultures were drawn, patient was given vancomycin for treatment. Because of his bilateral, patient is diabetic, she is elderly and lives alone patient will require admission to ensure cellulitis resolves. Patient was admitted to [facility], MedSurg unit for continued care.  I personally performed the services described in this documentation, which was scribed in my presence. The recorded information has been reviewed and is accurate.    [name], MD 12/07/14 [phone]

## 2014-12-07 NOTE — Progress Notes (Signed)
Rehab Admissions Coordinator Note:  Patient was screened by Cleatrice Burke for appropriateness for an Inpatient Acute Rehab Consult per PT recommendations.  At this time, we are recommending Inpatient Rehab consult. I will contact MD for order.  Cleatrice Burke 12/07/2014, 5:16 PM  I can be reached at [phone].

## 2014-12-07 NOTE — Progress Notes (Signed)
Patient ID: [name], female   DOB: April 24, 1941, 74 y.o.   MRN: [id number]  [facility] PROGRESS NOTE  [name] WUX:[id number] DOB: Sep 30, 1940 DOA: 12/07/2014 PCP: Hollace Kinnier, DO   Brief narrative:    75 y.o. female with Parkinson's disease, diastolic dysfunction, chronic anemia presented to [facility] ED with main concern of bilateral LE pain, LLE > RLE, almost 2 weeks duration but much worse over the past 24 hours, 7/10 in severity, from knees below, associated with mild erythema and warmth to touch. No fevers, chills, no numbness or tingling.   In ED, pt noted to be hemodynamically stable, VSS. [facility] asked to admit for further evaluation. Preliminary LE venous doppler negative for DVT.  Assessment/Plan:    Principal Problem:   Lower extremity pain, bilateral - agree with cellulitis based on physical exam  - will continue Vancomycin that was started in ED - monitor response and provide analgesia as needed - PT requested  Active Problems:   Parkinson's disease - continue home medical regimen    Diastolic dysfunction, grade 2 - on lasix at home and will continue here - currently compensated  - last EF in 2013 was 55 - 60% - monitor daily weights    Anemia of chronic disease, IDA - Hg stable, no signs of bleeding - CBC in AM   Previous history of renal mass  - patient states that she was not able to get MRI done because of her Parkinson's disease - renal sonogram requested   DVT prophylaxis: SCD's  Code Status: Full.  Family Communication:  plan of care discussed with the patient Disposition Plan: Home when stable.   IV access:  Peripheral IV  Procedures and diagnostic studies:    LE venous doppler 3/16 --> no evidence of DVT   Medical Consultants:  None  Other Consultants:  None  IAnti-Infectives:   Vancomycin 3/16 -->  Faye Ramsay, MD  [facility] Pager [phone]  If 7PM-7AM, please contact night-coverage [email or web address] Password [facility] 12/07/2014, 9:51  AM   LOS: 0 days   HPI/Subjective: No events overnight.   Objective: Filed Vitals:   12/07/14 0622 12/07/14 0630 12/07/14 0730 12/07/14 0805  BP:  162/78 156/71 149/75  Pulse:  85 80   Temp: 98.7 F (37.1 C)   97.8 F (36.6 C)  TempSrc: Rectal   Oral  Resp:      Height:    5\' 4"  (1.626 m)  Weight:    80.922 kg (178 lb 6.4 oz)  SpO2:  96% 95% 98%   No intake or output data in the 24 hours ending 12/07/14 0951  Exam:   General:  Pt is alert, follows commands appropriately, not in acute distress  Cardiovascular: Regular rate and rhythm, no rubs, no gallops  Respiratory: Clear to auscultation bilaterally, no wheezing, no crackles, no rhonchi  Abdomen: Soft, non tender, non distended, bowel sounds present, no guarding  Extremities: LE erythema on anterior shins, mild, +1 bilateral LE edema, and TTP, pulses DP and PT palpable bilaterally  Data Reviewed: Basic Metabolic Panel:  Recent Labs Lab 12/07/14 0407 12/07/14 0750  NA 141 139  K 3.8 3.5  CL 109 107  CO2 26 26  GLUCOSE 102* 107*  BUN 16 12  CREATININE 0.75 0.71  CALCIUM 8.8 8.4   Liver Function Tests:  Recent Labs Lab 12/07/14 0750  AST 26  ALT 18  ALKPHOS 69  BILITOT 1.2  PROT 5.8*  ALBUMIN 3.3*   CBC:  Recent Labs Lab  12/07/14 0407 12/07/14 0750  WBC 9.0 9.6  NEUTROABS 6.8 7.7  HGB 11.6* 11.7*  HCT 36.9 36.7  MCV 80.6 80.3  PLT 153 149*   Scheduled Meds: . carbidopa-levodopa  1.5 tablet Oral 4 times per day  . ferrous sulfate  325 mg Oral Daily  . multivitamin with minerals  1 tablet Oral Daily  . pramipexole  1.5 mg Oral TID  . sodium chloride  3 mL Intravenous Q12H  . vancomycin  750 mg Intravenous Q12H   Continuous Infusions:

## 2014-12-07 NOTE — H&P (Addendum)
[facility] History and Physical  [name] EYC:[id number] DOB: [birth year] DOA: 12/07/2014  Referring physician: ER physician. PCP: Hollace Kinnier, DO   Chief Complaint: Lower extremity pain.  HPI: [name] is a 74 y.o. female with history of Parkinson's disease, diastolic dysfunction, chronic anemia presents to the ER because of lower extremity pain. Patient states she has been having lower extremity pain over the last 2 weeks which has acutely worsened yesterday. Patient states the pain has become suddenly severe. Pain is in the both lower extremities below the knees. More on the left side. Patient denies any fever or chills. On exam patient has mild erythema over both anterior aspect of the legs. On exam patient has warm skin. At this time patient has been chemically started on vancomycin for possible cellulitis and admitted for further management.  Review of Systems: As presented in the history of presenting illness, rest negative.  Past Medical History  Diagnosis Date  . Parkinson's disease   . Hypertension   . Arthritis   . Anemia   . Cellulitis of lower leg 12/24/2011  . Acute bronchitis   . Other abnormal blood chemistry   . Chronic systolic heart failure   . Acute on chronic systolic heart failure   . Cellulitis and abscess of leg, except foot   . Hypotension, unspecified   . Helicobacter pylori (H. pylori)   . Acute gastric ulcer with hemorrhage, without mention of obstruction    Past Surgical History  Procedure Laterality Date  . Hip arthroscopy w/ labral repair    . Thyroidectomy    . Esophagogastroduodenoscopy  09/26/2011    Procedure: ESOPHAGOGASTRODUODENOSCOPY (EGD);  Surgeon: Zenovia Jarred, MD;  Location: [facility] ENDOSCOPY;  Service: Gastroenterology;  Laterality: N/A;  To be done at bedside.  . Esophagogastroduodenoscopy N/A 09/30/2013    Procedure: ESOPHAGOGASTRODUODENOSCOPY (EGD);  Surgeon: [name], MD;  Location: [place];  Service: Endoscopy;   Laterality: N/A;  . Esophagogastroduodenoscopy N/A 11/25/2013    Procedure: ESOPHAGOGASTRODUODENOSCOPY (EGD);  Surgeon: [name], MD;  Location: [facility] ENDOSCOPY;  Service: Endoscopy;  Laterality: N/A;   Social History:  reports that she has never smoked. She has never used smokeless tobacco. She reports that she does not drink alcohol or use illicit drugs. Where does patient live home. Can patient participate in ADLs? Yes.  Allergies  Allergen Reactions  . Codeine Other (See Comments)    unknown  . Penicillins Other (See Comments)    unknown    Family History:  Family History  Problem Relation Age of Onset  . GER disease Mother   . Heart disease Father       Prior to Admission medications   Medication Sig Start Date End Date Taking? Authorizing Provider  carbidopa-levodopa (SINEMET IR) 25-100 MG per tablet TAKE ONE AND ONE-HALF TABLETS BY MOUTH 4 TIMES DAILY. TAKE AT 6 AM, 10AM, 2PM AND 6PM 11/01/14  Yes Star Age, MD  Ferrous Sulfate (IRON) 325 (65 FE) MG TABS Take 1 capsule by mouth daily. 12/23/13  Yes Jerene Bears, MD  furosemide (LASIX) 20 MG tablet Take 20 mg by mouth 2 (two) times daily as needed for fluid.    Yes Historical Provider, MD  HYDROcodone-acetaminophen (NORCO/VICODIN) 5-325 MG per tablet Take 1 tablet by mouth 3 (three) times daily as needed for severe pain. For neck pain Patient not taking: Reported on 12/07/2014 02/11/14   [name] L Reed, DO  MAGNESIUM PO Take 1 tablet by mouth daily.   Yes  Historical Provider, MD  Multiple Vitamins-Minerals (MULTIVITAMIN WITH MINERALS) tablet Take 1 tablet by mouth daily.   Yes Historical Provider, MD  pantoprazole (PROTONIX) 40 MG tablet Take 1 tablet (40 mg total) by mouth 2 (two) times daily. Patient not taking: Reported on 12/07/2014 12/28/13   Jerene Bears, MD  polyethylene glycol powder (GLYCOLAX/MIRALAX) powder Take 17 g by mouth 2 (two) times daily as needed. Patient not taking: Reported on 12/07/2014 04/18/14   [name] L  Reed, DO  pramipexole (MIRAPEX) 1.5 MG tablet Take 1 tablet (1.5 mg total) by mouth 3 (three) times daily. 11/01/14  Yes Star Age, MD  senna (SENOKOT) 8.6 MG TABS tablet Take 2 tablets (17.2 mg total) by mouth daily. Patient not taking: Reported on 12/07/2014 04/18/14   Gayland Curry, DO    Physical Exam: Filed Vitals:   12/07/14 0300 12/07/14 0430 12/07/14 0530 12/07/14 0622  BP: 137/60 149/89 160/78   Pulse: 80 90 95   Temp:    98.7 F (37.1 C)  TempSrc:    Rectal  Resp:      Height:      Weight:      SpO2: 95% 91% 96%      General:  Moderately built and nourished.  Eyes: Anicteric no pallor.  ENT: No discharge from the ears eyes nose or mouth.  Neck: No mass felt.  Cardiovascular: S1 and S2 heard.  Respiratory: No rhonchi or crepitations.  Abdomen: Soft nontender bowel sounds present.  Skin: Erythema on the anterior shin of both lower extremity is.  Musculoskeletal: No edema.  Psychiatric: Appears normal.  Neurologic: Alert awake oriented to time place and person. Moves all extremities. Has tremors.  Labs on Admission:  Basic Metabolic Panel:  Recent Labs Lab 12/07/14 0407  NA 141  K 3.8  CL 109  CO2 26  GLUCOSE 102*  BUN 16  CREATININE 0.75  CALCIUM 8.8   Liver Function Tests: No results for input(s): AST, ALT, ALKPHOS, BILITOT, PROT, ALBUMIN in the last 168 hours. No results for input(s): LIPASE, AMYLASE in the last 168 hours. No results for input(s): AMMONIA in the last 168 hours. CBC:  Recent Labs Lab 12/07/14 0407  WBC 9.0  NEUTROABS PENDING  HGB 11.6*  HCT 36.9  MCV 80.6  PLT PENDING   Cardiac Enzymes: No results for input(s): CKTOTAL, CKMB, CKMBINDEX, TROPONINI in the last 168 hours.  BNP (last 3 results) No results for input(s): BNP in the last 8760 hours.  ProBNP (last 3 results) No results for input(s): PROBNP in the last 8760 hours.  CBG: No results for input(s): GLUCAP in the last 168 hours.  Radiological Exams on  Admission: No results found.   Assessment/Plan Principal Problem:   Lower extremity pain Active Problems:   Parkinson's disease   Diastolic dysfunction, grade 2   Cellulitis   1. Bilateral lower extremity pain - more on the left side. Since patient has mild erythema and warmth on the skin patient has been empirically started on vancomycin for possible cellulitis. Patient states the pain is sharp shooting. If patient's pain does not improve then may consider further workup. I have placed patient on when necessary oxycodone and get physical therapy. 2. History of Parkinson's disease - continue present medication. 3. History of diastolic dysfunction last EF measured was 55-60% in 2013 - on Lasix. Appears compensated. 4. Chronic anemia - follow CBC. 5. History of thrombocytopenia - platelets counts are still pending. Follow CBC. 6. Previous history of renal mass -  patient states that she was not able to get MRI done because of her Parkinson's disease. Check renal sonogram.   DVT ProphylaxisSCDs for now until platelet counts are known.  Code Status:fully code. Family Communication:none. Disposition Plan:  Admit to inpatient.    Johany Hansman N. [facility] Pager [phone].  If 7PM-7AM, please contact night-coverage [email or web address] Password [email or web address] 12/07/2014, 6:30 AM       .

## 2014-12-07 NOTE — Progress Notes (Signed)
ANTIBIOTIC CONSULT NOTE - INITIAL  Pharmacy Consult for Vancomycin  Indication: Cellulitis  Allergies  Allergen Reactions  . Codeine Other (See Comments)    unknown  . Penicillins Other (See Comments)    unknown    Patient Measurements: Height: 5\' 4"  (162.6 cm) Weight: 174 lb (78.926 kg) IBW/kg (Calculated) : 54.7  Vital Signs: Temp: 98.7 F (37.1 C) (03/16 0622) Temp Source: Rectal (03/16 0622) BP: 156/71 mmHg (03/16 0730) Pulse Rate: 80 (03/16 0730)  Labs:  Recent Labs  12/07/14 0407  WBC 9.0  HGB 11.6*  PLT 153  CREATININE 0.75    Medical History: Past Medical History  Diagnosis Date  . Parkinson's disease   . Hypertension   . Arthritis   . Anemia   . Cellulitis of lower leg 12/24/2011  . Acute bronchitis   . Other abnormal blood chemistry   . Chronic systolic heart failure   . Acute on chronic systolic heart failure   . Cellulitis and abscess of leg, except foot   . Hypotension, unspecified   . Helicobacter pylori (H. pylori)   . Acute gastric ulcer with hemorrhage, without mention of obstruction     Assessment: 74 y/o F to start vancomycin for lower extremity cellulitis. WBC WNL. Renal age appropriate.   Goal of Therapy:  Vancomycin trough level 10-15 mcg/ml  Plan:  -Vancomycin 750 mg IV q12h -Trend WBC, temp, renal function  -Drug levels as indicated   Narda Bonds 12/07/2014,7:37 AM

## 2014-12-07 NOTE — Progress Notes (Signed)
Pt arrived to 4N27 at current time.  Pt A&O x 4, c/o 5/10 bilateral calf pain, site red, warm and dry.   Pt V/S taken,  . Pt without distress.  Family at the bedside. Diet ordered, will monitor.

## 2014-12-07 NOTE — Progress Notes (Signed)
Called MD about low BP and they indicated 500cc bolus. Will continue to monitor. [name] E, [place]  12/07/2014 2000

## 2014-12-07 NOTE — ED Notes (Signed)
Per EMS, pt with hx of Parkinson's disease. Pt reports bilateral calf pain since yesterday afternoon. Pt states that the pain makes her Parkinson's tremors worse. Pt lives at home by herself. Pt's bilateral lower legs appear to be red. No swelling noted.

## 2014-12-07 NOTE — Progress Notes (Addendum)
Pt parkinsonian tremors much more severe than when admitted, pt reports that this is a normal occurrence when a dose of her Sinemet is missed/late.  MD notified, 0.5 mg PO Ativan given.  Will monitor.

## 2014-12-07 NOTE — Progress Notes (Signed)
BP 96/52, taken manually.  MD notified.

## 2014-12-07 NOTE — Progress Notes (Signed)
*  PRELIMINARY RESULTS* Vascular Ultrasound Lower extremity venous duplex has been completed.  Preliminary findings: no evidence of DVT.   Landry Mellow, RDMS, RVT  12/07/2014, 8:54 AM

## 2014-12-07 NOTE — Evaluation (Signed)
Physical Therapy Evaluation Patient Details Name: [name] MRN: [id number] DOB: Feb 21, 1941 Today's Date: 12/07/2014   History of Present Illness  Patient is a 74 y/o female admitted with LE pain.  PMH positive for  Parkinson's disease, diastolic dysfunction, chronic anemia.    Clinical Impression  Patient presents with decreased independence and safety with mobility.  She agrees she is not safe for d/c home alone today.  Feel her baseline is likely scary, but somehow she functions and denies recent falls.  Patient interested in pursuing inpatient rehab for intensive therapies prior to d/c home.  Not sure if could have 24 hour assist at d/c, but certainly has history of intermittent help daily from her brother.  Will follow acutely to address deficits and maximize independence/safety with mobility.    Follow Up Recommendations Supervision/Assistance - 24 hour;CIR    Equipment Recommendations  None recommended by PT    Recommendations for Other Services       Precautions / Restrictions Precautions Precautions: Fall      Mobility  Bed Mobility Overal bed mobility: Needs Assistance Bed Mobility: Supine to Sit;Sit to Supine;Rolling Rolling: Min assist (with rail)   Supine to sit: Mod assist Sit to supine: Mod assist   General bed mobility comments: assist for lifting trunk and scooting to edge of bed to sit; assist for repositioning trunk to left in supine and cues w/bed in trendelenberg to scoot to [facility]  Transfers Overall transfer level: Needs assistance Equipment used: Rolling walker (2 wheeled) Transfers: Sit to/from Stand Sit to Stand: Min assist         General transfer comment: initially attempts w/o assist pt lands back on bed, then assist for anterior weight shift   Ambulation/Gait Ambulation/Gait assistance: Min assist Ambulation Distance (Feet): 130 Feet Assistive device: Rolling walker (2 wheeled) Gait Pattern/deviations: Step-through pattern;Decreased  stride length;Shuffle     General Gait Details: right trunk lean with head/neck dystonia and LOB x 2-3 to right min/mod assist to recover, improved while walking.  Stairs            Wheelchair Mobility    Modified Rankin (Stroke Patients Only)       Balance Overall balance assessment: Needs assistance         Standing balance support: Bilateral upper extremity supported Standing balance-Leahy Scale: Poor Standing balance comment: needs UE support for balance                             Pertinent Vitals/Pain Pain Assessment: No/denies pain    Home Living Family/patient expects to be discharged to:: Private residence Living Arrangements: Alone Available Help at Discharge: Available PRN/intermittently;Family Type of Home: House Home Access: Ramped entrance     Home Layout: One level Home Equipment: [place] - 4 wheels;Bedside commode;Tub bench;Wheelchair - Biomedical engineer Comments: pt lives alone, brothers get her groceries for her, she has Meals on Wheels for lunch and one of her brothers brings her supper.    Prior Function Level of Independence: Independent with assistive device(s)         Comments: occasionally has "freezing" spells when she has to limit mobility due to potential to fall.     Hand Dominance   Dominant Hand: Right    Extremity/Trunk Assessment   Upper Extremity Assessment: RUE deficits/detail;LUE deficits/detail RUE Deficits / Details: strength grossly WFL dystonia present mildly in UE and severe in head/neck     LUE Deficits / Details:  strength grossly WFL dystonia present mildly in UE and severe in head/neck   Lower Extremity Assessment: RLE deficits/detail;LLE deficits/detail RLE Deficits / Details: AROM WFL, strength grossly 4 to 4+/5 LLE Deficits / Details: AROM WFL, strength grossly 4 to 4+/5     Communication   Communication: Other (comment) (spastic dysarthria)  Cognition Arousal/Alertness:  Awake/alert Behavior During Therapy: WFL for tasks assessed/performed Overall Cognitive Status: No family/caregiver present to determine baseline cognitive functioning                      General Comments General comments (skin integrity, edema, etc.): pt incontinent in bed, but denies use of depends at home    Exercises        Assessment/Plan    PT Assessment Patient needs continued PT services  PT Diagnosis Abnormality of gait;Acute pain   PT Problem List Decreased mobility;Decreased safety awareness;Decreased balance;Pain;Impaired tone;Decreased coordination  PT Treatment Interventions DME instruction;Therapeutic exercise;Gait training;Wheelchair mobility training;Balance training;Functional mobility training;Therapeutic activities;Patient/family education   PT Goals (Current goals can be found in the Care Plan section) Acute Rehab PT Goals Patient Stated Goal: Patient interested in hospital based rehab PT Goal Formulation: With patient Time For Goal Achievement: 12/21/14 Potential to Achieve Goals: Good    Frequency Min 3X/week   Barriers to discharge Decreased caregiver support      Co-evaluation               End of Session Equipment Utilized During Treatment: Gait belt Activity Tolerance: Patient tolerated treatment well Patient left: in bed;with call bell/phone within reach;with bed alarm set           Time: 1530-1610 PT Time Calculation (min) (ACUTE ONLY): 40 min   Charges:   PT Evaluation $Initial PT Evaluation Tier I: 1 Procedure PT Treatments $Gait Training: 8-22 mins $Therapeutic Activity: 8-22 mins   PT G Codes:        Caeden Foots,CYNDI 01/03/15, 4:34 PM  Magda Kiel, [place] January 03, 2015

## 2014-12-07 NOTE — Progress Notes (Signed)
CARE MANAGEMENT NOTE 12/07/2014  Patient:  [name], [name]   Account Number:  [id number]  Date Initiated:  12/07/2014  Documentation initiated by:  Olga Coaster  Subjective/Objective Assessment:   ADMITTED WITH CELLULITIS     Action/Plan:   CM FOLLOWING FOR DCP   Anticipated DC Date:  12/14/2014   Anticipated DC Plan:  AWAITING FOR PT/OT EVALS FOR DISPOSITION NEEDS     DC Planning Services  CM consult         Status of service:  In process, will continue to follow  Per UR Regulation:  Reviewed for med. necessity/level of care/duration of stay  Comments:  3/16/2016Mindi Slicker RN,BSN,MHA [phone]

## 2014-12-08 ENCOUNTER — Inpatient Hospital Stay (HOSPITAL_COMMUNITY): Payer: Medicare Other

## 2014-12-08 ENCOUNTER — Encounter (HOSPITAL_COMMUNITY): Payer: Self-pay | Admitting: *Deleted

## 2014-12-08 ENCOUNTER — Inpatient Hospital Stay (HOSPITAL_COMMUNITY)
Admission: RE | Admit: 2014-12-08 | Discharge: 2014-12-17 | DRG: 057 | Disposition: A | Discharge status: Home Health | Payer: Medicare Other | Source: Intra-hospital | Attending: Physical Medicine & Rehabilitation | Admitting: Physical Medicine & Rehabilitation

## 2014-12-08 DIAGNOSIS — L03116 Cellulitis of left lower limb: Secondary | ICD-10-CM | POA: Diagnosis present

## 2014-12-08 DIAGNOSIS — I10 Essential (primary) hypertension: Secondary | ICD-10-CM | POA: Diagnosis present

## 2014-12-08 DIAGNOSIS — D509 Iron deficiency anemia, unspecified: Secondary | ICD-10-CM

## 2014-12-08 DIAGNOSIS — L03119 Cellulitis of unspecified part of limb: Secondary | ICD-10-CM | POA: Diagnosis not present

## 2014-12-08 DIAGNOSIS — G20 Parkinson's disease: Secondary | ICD-10-CM | POA: Diagnosis not present

## 2014-12-08 DIAGNOSIS — L03115 Cellulitis of right lower limb: Secondary | ICD-10-CM | POA: Diagnosis present

## 2014-12-08 DIAGNOSIS — F419 Anxiety disorder, unspecified: Secondary | ICD-10-CM | POA: Diagnosis present

## 2014-12-08 DIAGNOSIS — I5032 Chronic diastolic (congestive) heart failure: Secondary | ICD-10-CM | POA: Diagnosis present

## 2014-12-08 DIAGNOSIS — G20A1 Parkinson's disease without dyskinesia, without mention of fluctuations: Secondary | ICD-10-CM

## 2014-12-08 DIAGNOSIS — N2889 Other specified disorders of kidney and ureter: Secondary | ICD-10-CM

## 2014-12-08 HISTORY — DX: Anxiety disorder, unspecified: F41.9

## 2014-12-08 HISTORY — DX: Heart failure, unspecified: I50.9

## 2014-12-08 LAB — BASIC METABOLIC PANEL
Anion gap: 7 (ref 5–15)
BUN: 14 mg/dL (ref 6–23)
CO2: 25 mmol/L (ref 19–32)
CREATININE: 0.81 mg/dL (ref 0.50–1.10)
Calcium: 8.5 mg/dL (ref 8.4–10.5)
Chloride: 107 mmol/L (ref 96–112)
GFR calc Af Amer: 81 mL/min — ABNORMAL LOW (ref >90)
GFR calc non Af Amer: 70 mL/min — ABNORMAL LOW (ref >90)
Glucose, Bld: 131 mg/dL — ABNORMAL HIGH (ref 70–99)
Potassium: 4.1 mmol/L (ref 3.5–5.1)
Sodium: 139 mmol/L (ref 135–145)

## 2014-12-08 LAB — CBC
HEMATOCRIT: 35.6 % — AB (ref 36.0–46.0)
HEMOGLOBIN: 11.1 g/dL — AB (ref 12.0–15.0)
MCH: 25.5 pg — ABNORMAL LOW (ref 26.0–34.0)
MCHC: 31.2 g/dL (ref 30.0–36.0)
MCV: 81.8 fL (ref 78.0–100.0)
PLATELETS: 133 10*3/uL — AB (ref 150–400)
RBC: 4.35 MIL/uL (ref 3.87–5.11)
RDW: 17.7 % — ABNORMAL HIGH (ref 11.5–15.5)
WBC: 7.1 10*3/uL (ref 4.0–10.5)

## 2014-12-08 MED ORDER — ACETAMINOPHEN 325 MG PO TABS
650.0000 mg | ORAL_TABLET | Freq: Four times a day (QID) | ORAL | Status: DC | PRN
  Administered 2014-12-09: 325 mg via ORAL
  Administered 2014-12-12 – 2014-12-13 (×2): 650 mg via ORAL
  Filled 2014-12-08 (×3): qty 2

## 2014-12-08 MED ORDER — LORAZEPAM 0.5 MG PO TABS
0.5000 mg | ORAL_TABLET | Freq: Two times a day (BID) | ORAL | Status: DC | PRN
  Administered 2014-12-08 – 2014-12-17 (×2): 0.5 mg via ORAL
  Filled 2014-12-08 (×2): qty 1

## 2014-12-08 MED ORDER — CARBIDOPA-LEVODOPA 25-100 MG PO TABS
1.5000 | ORAL_TABLET | ORAL | Status: DC
  Administered 2014-12-08 – 2014-12-17 (×35): 1.5 via ORAL
  Filled 2014-12-08 (×40): qty 1.5

## 2014-12-08 MED ORDER — ONDANSETRON HCL 4 MG/2ML IJ SOLN: 4.0000 mg | Freq: Four times a day (QID) | INTRAMUSCULAR | Status: DC | PRN

## 2014-12-08 MED ORDER — ONDANSETRON HCL 4 MG PO TABS
4.0000 mg | ORAL_TABLET | Freq: Four times a day (QID) | ORAL | Status: DC | PRN
  Administered 2014-12-10 – 2014-12-12 (×3): 4 mg via ORAL
  Filled 2014-12-08 (×3): qty 1

## 2014-12-08 MED ORDER — CIPROFLOXACIN HCL 500 MG PO TABS: 500.0000 mg | ORAL_TABLET | Freq: Two times a day (BID) | ORAL | Status: DC

## 2014-12-08 MED ORDER — DOXYCYCLINE HYCLATE 100 MG PO TABS: 100.0000 mg | ORAL_TABLET | Freq: Two times a day (BID) | ORAL | Status: DC

## 2014-12-08 MED ORDER — OXYCODONE HCL 5 MG PO TABS
5.0000 mg | ORAL_TABLET | ORAL | Status: DC | PRN
  Administered 2014-12-10: 5 mg via ORAL
  Filled 2014-12-08: qty 1

## 2014-12-08 MED ORDER — ACETAMINOPHEN 650 MG RE SUPP: 650.0000 mg | Freq: Four times a day (QID) | RECTAL | Status: DC | PRN

## 2014-12-08 MED ORDER — ADULT MULTIVITAMIN W/MINERALS CH
1.0000 | ORAL_TABLET | Freq: Every day | ORAL | Status: DC
  Administered 2014-12-09 – 2014-12-17 (×9): 1 via ORAL
  Filled 2014-12-08 (×10): qty 1

## 2014-12-08 MED ORDER — CIPROFLOXACIN HCL 500 MG PO TABS
500.0000 mg | ORAL_TABLET | Freq: Two times a day (BID) | ORAL | Status: DC
  Administered 2014-12-08: 500 mg via ORAL
  Filled 2014-12-08 (×2): qty 1

## 2014-12-08 MED ORDER — SORBITOL 70 % SOLN
30.0000 mL | Freq: Every day | Status: DC | PRN
  Administered 2014-12-13 – 2014-12-14 (×2): 30 mL via ORAL
  Filled 2014-12-08 (×2): qty 30

## 2014-12-08 MED ORDER — CIPROFLOXACIN HCL 500 MG PO TABS
500.0000 mg | ORAL_TABLET | Freq: Two times a day (BID) | ORAL | Status: AC
  Administered 2014-12-09 – 2014-12-15 (×14): 500 mg via ORAL
  Filled 2014-12-08 (×14): qty 1

## 2014-12-08 MED ORDER — FERROUS SULFATE 325 (65 FE) MG PO TABS
325.0000 mg | ORAL_TABLET | Freq: Every day | ORAL | Status: DC
  Administered 2014-12-09 – 2014-12-17 (×9): 325 mg via ORAL
  Filled 2014-12-08 (×11): qty 1

## 2014-12-08 MED ORDER — PRAMIPEXOLE DIHYDROCHLORIDE 1.5 MG PO TABS
1.5000 mg | ORAL_TABLET | Freq: Three times a day (TID) | ORAL | Status: DC
  Administered 2014-12-08 – 2014-12-12 (×14): 1.5 mg via ORAL
  Filled 2014-12-08 (×19): qty 1

## 2014-12-08 NOTE — PMR Pre-admission (Signed)
PMR Admission Coordinator Pre-Admission Assessment  Patient: [name] is an 74 y.o., female MRN: [id number] DOB: May 18, 1941 Height: 5\' 4"  (162.6 cm) Weight: 80.65 kg (177 lb 12.8 oz)              Insurance Information HMO: No    PPO:       PCP:       IPA:       80/20:       OTHER:   PRIMARY: Medicare A/B      Policy#: [id number]      Subscriber: [name] CM Name:        Phone#:       Fax#:   Pre-Cert#:        Employer: Retired Benefits:  Phone #:       Name: Checked in [place]. Date:  )05/24/05     Deduct: $1288      Out of Pocket Max: none      Life Max: unlimited CIR: 100%      [name]: 100 days Outpatient: 80%     Co-Pay: 20% Home Health: 100%      Co-Pay: none DME: 80%     Co-Pay: 20% Providers: patient's choice  SECONDARY: [employer] of [place] supp      Policy#: [id number]      Subscriber: [name] CM Name:        Phone#:       Fax#:   Pre-Cert#:        Employer: Retired Benefits:  Phone #: [phone]     Name:   Eff. Date:       Deduct:        Out of Pocket Max:        Life Max:   CIR:        [name]:   Outpatient:       Co-Pay:   Home Health:        Co-Pay:   DME:       Co-Pay:     Emergency Contact Information Contact Information    Name Relation Home Work Mobile   Licht,William [place]) Brother [phone]     [place], [place]  [phone]  [phone]     Current Medical History  Patient Admitting Diagnosis:  Pseudo-exacerbation of Parkinson's Disease related to bilateral LE cellulitis  History of Present Illness: A 74 y.o. right handed female with history of diastolic congestive heart failure, chronic anemia, advanced Parkinson's disease. Patient well known to rehabilitation services from admission January 2015 for deconditioning related to Parkinson's disease with pseudo-exacerbation. Patient lives alone and Independent prior to admission with assistive device. Her brother does her grocery shopping. She has Meals on Wheels for lunch. Presented 12/07/2014 with lower  extremity pain left greater than right 2 weeks associated with mild erythema and warmth to touch. No fever or chills reported. Preliminary venous Doppler studies negative for DVT. Suspect cellulitis placed on vancomycin.. She remains on Mirapex as well as Sinemet as prior to admission for history of Parkinson's. Physical therapy evaluation completed 12/07/2014 with recommendations of physical medicine rehabilitation consult.    Past Medical History  Past Medical History  Diagnosis Date  . Parkinson's disease   . Hypertension   . Arthritis   . Anemia   . Cellulitis of lower leg 12/24/2011  . Acute bronchitis   . Other abnormal blood chemistry   . Chronic systolic heart failure   . Acute on chronic systolic heart failure   . Cellulitis and abscess of leg,  except foot   . Hypotension, unspecified   . Helicobacter pylori (H. pylori)   . Acute gastric ulcer with hemorrhage, without mention of obstruction     Family History  family history includes GER disease in her mother; Heart disease in her father.  Prior Rehab/Hospitalizations:  Was on CIR 01/15.  Had [facility] therapies with [facility] recently.   Current Medications   Current facility-administered medications:  .  acetaminophen (TYLENOL) tablet 650 mg, 650 mg, Oral, Q6H PRN **OR** acetaminophen (TYLENOL) suppository 650 mg, 650 mg, Rectal, Q6H PRN, Rise Patience, MD .  carbidopa-levodopa (SINEMET IR) 25-100 MG per tablet immediate release 1.5 tablet, 1.5 tablet, Oral, 4 times per day, Rise Patience, MD, 1.5 tablet at 12/08/14 1031 .  ciprofloxacin (CIPRO) tablet 500 mg, 500 mg, Oral, BID, Theodis Blaze, MD .  ferrous sulfate tablet 325 mg, 325 mg, Oral, Daily, Rise Patience, MD, 325 mg at 12/08/14 1031 .  furosemide (LASIX) tablet 20 mg, 20 mg, Oral, BID PRN, Rise Patience, MD .  LORazepam (ATIVAN) tablet 0.5 mg, 0.5 mg, Oral, BID PRN, Theodis Blaze, MD, 0.5 mg at 12/08/14 1049 .  multivitamin with minerals tablet 1  tablet, 1 tablet, Oral, Daily, Rise Patience, MD, 1 tablet at 12/08/14 1031 .  ondansetron (ZOFRAN) tablet 4 mg, 4 mg, Oral, Q6H PRN **OR** ondansetron (ZOFRAN) injection 4 mg, 4 mg, Intravenous, Q6H PRN, Rise Patience, MD .  oxyCODONE (Oxy IR/ROXICODONE) immediate release tablet 5 mg, 5 mg, Oral, Q4H PRN, Rise Patience, MD, 5 mg at 12/07/14 1020 .  pramipexole (MIRAPEX) tablet 1.5 mg, 1.5 mg, Oral, TID, Rise Patience, MD, 1.5 mg at 12/08/14 1049 .  sodium chloride 0.9 % injection 3 mL, 3 mL, Intravenous, Q12H, Rise Patience, MD, 3 mL at 12/08/14 1032  Patients Current Diet: Diet Heart Diet - low sodium heart healthy  Precautions / Restrictions Precautions Precautions: Fall Restrictions Weight Bearing Restrictions: No   Prior Activity Level Household: Mostly homebound.  Goes out 1 X every 2 weeks to church.   Home Assistive Devices / Equipment Home Assistive Devices/Equipment: None Home Equipment: Walker - 4 wheels, Bedside commode, Tub bench, Wheelchair - manual, Electric scooter  Prior Functional Level Prior Function Level of Independence: Independent with assistive device(s) Comments: occasionally has "freezing" spells when she has to limit mobility due to potential to fall.  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    Mobility  Overal bed mobility: Needs Assistance Bed Mobility: Supine to Sit, Sit to Supine, Rolling Rolling: Min assist (with rail) Supine to sit: Mod assist Sit to supine: Mod assist General bed mobility comments: assist for lifting trunk and scooting to edge of bed to sit; assist for repositioning trunk to left in supine and cues w/bed in trendelenberg to scoot to [facility]    Transfers  Overall transfer level: Needs  assistance Equipment used: Rolling walker (2 wheeled) Transfers: Sit to/from Stand Sit to Stand: Min assist General transfer comment: initially attempts w/o assist pt lands back on bed, then assist for anterior weight shift     Ambulation / Gait / Stairs / Wheelchair Mobility  Ambulation/Gait Ambulation/Gait assistance: Museum/gallery curator (Feet): 130 Feet Assistive device: Rolling walker (2 wheeled) Gait Pattern/deviations: Step-through pattern, Decreased stride length, Shuffle General Gait Details: right trunk lean with head/neck dystonia and LOB x 2-3 to right min/mod assist to recover, improved while walking.    Posture / Balance Balance Overall balance assessment: Needs assistance Standing balance support: Bilateral upper extremity supported Standing balance-Leahy Scale: Poor Standing balance comment: needs UE support for balance    Special needs/care consideration BiPAP/CPAP No CPM No Continuous Drip IV No Dialysis No         Life Vest No Oxygen No Special Bed No Trach Size No Wound Vac (area) No      Skin B LE cellulitis                              Bowel mgmt: Last BM 12/07/14 with incontinence 12/06/14 Bladder mgmt: Voiding on bedpan with some incontinence Diabetic mgmt No    Previous Home Environment Living Arrangements: Alone Available Help at Discharge: Available PRN/intermittently, Family Type of Home: House Home Layout: One level Home Access: Ramped  entrance Additional Comments: pt lives alone, brothers get her groceries for her, she has Meals on Wheels for lunch and one of her brothers brings her supper.  Discharge Living Setting Plans for Discharge Living Setting: Alone, House (Lives alone.) Type of Home at Discharge: House Discharge Home Layout: One level Discharge Home Access: Ramped entrance  [place] Patient Roles: Other (Comment) (Has a brother.) Contact Information: [name] ([name]) Dayna Ramus - brother Anticipated Caregiver: self and brother Anticipated Caregiver's Contact Information: [name] [phone] Ability/Limitations of Caregiver: Brother comes by every evening and brings dinner.  He stays 5 to 15 minutes usually. Caregiver Availability: Intermittent Discharge Plan Discussed with Primary Caregiver: Yes Is Caregiver In Agreement with Plan?: Yes Does Caregiver/Family have Issues with Lodging/Transportation while Pt is in Rehab?: No  Goals/Additional Needs Patient/Family Goal for Rehab: PT mod I, OT Mod I to supervision goals Expected length of stay: 11-14 days Cultural Considerations: Attends [employer] Dietary Needs: Heart, thin liquids Equipment Needs: TBD Pt/Family Agrees to Admission and willing to participate: Yes Program Orientation Provided & Reviewed with Pt/Caregiver Including Roles  & Responsibilities: Yes  Decrease burden of Care through IP rehab admission: N/A  Possible need for [name] placement upon discharge: Yes, if she cannot progress to discharge home alone.  Patient Condition: This patient's condition remains as documented in the consult dated 12/07/14, in which the Rehabilitation Physician determined and documented that the patient's condition is appropriate for intensive rehabilitative care in an inpatient rehabilitation facility. Will admit to inpatient rehab today.  Preadmission Screen Completed By:  Retta Diones, 12/08/2014 1:03  PM ______________________________________________________________________   Discussed status with Dr. Naaman Plummer on 12/08/14 at 48 and received telephone approval for admission today.  Admission Coordinator:  Retta Diones, time1302/Date03/17/16

## 2014-12-08 NOTE — H&P (Signed)
Physical Medicine and Rehabilitation Admission H&P   Chief Complaint  Patient presents with  . Leg Pain  . Back Pain  : HPI: [name] is a 74 y.o. right handed female with history of diastolic congestive heart failure, chronic anemia, advanced Parkinson's disease. Patient well known to rehabilitation services from admission January 2015 for deconditioning related to Parkinson's disease with pseudo-exacerbation. Patient lives alone and Independent prior to admission with assistive device. Her brother does her grocery shopping. She has Meals on Wheels for lunch. Presented 12/07/2014 with lower extremity pain left greater than right 2 weeks associated with mild erythema and warmth to touch. No fever or chills reported. Preliminary venous Doppler studies negative for DVT. Suspect cellulitis placed on vancomycin and transitioned to Cipro 7 days and stop.. She remains on Mirapex as well as Sinemet as prior to admission for history of Parkinson's. Physical therapy evaluation completed 12/07/2014 with recommendations of physical medicine rehabilitation consult. Patient was admitted for comprehensive rehabilitation program  ROS Review of Systems  Gastrointestinal: Positive for constipation.  Neurological: Positive for tremors and weakness.  All other systems reviewed and are negative   Past Medical History  Diagnosis Date  . Parkinson's disease   . Hypertension   . Arthritis   . Anemia   . Cellulitis of lower leg 12/24/2011  . Acute bronchitis   . Other abnormal blood chemistry   . Chronic systolic heart failure   . Acute on chronic systolic heart failure   . Cellulitis and abscess of leg, except foot   . Hypotension, unspecified   . Helicobacter pylori (H. pylori)   . Acute gastric ulcer with hemorrhage, without mention of obstruction    Past Surgical History  Procedure Laterality Date  . Hip arthroscopy w/ labral repair     . Thyroidectomy    . Esophagogastroduodenoscopy  09/26/2011    Procedure: ESOPHAGOGASTRODUODENOSCOPY (EGD); Surgeon: Zenovia Jarred, MD; Location: [facility] ENDOSCOPY; Service: Gastroenterology; Laterality: N/A; To be done at bedside.  . Esophagogastroduodenoscopy N/A 09/30/2013    Procedure: ESOPHAGOGASTRODUODENOSCOPY (EGD); Surgeon: [name], MD; Location: [place]; Service: Endoscopy; Laterality: N/A;  . Esophagogastroduodenoscopy N/A 11/25/2013    Procedure: ESOPHAGOGASTRODUODENOSCOPY (EGD); Surgeon: [name], MD; Location: [facility] ENDOSCOPY; Service: Endoscopy; Laterality: N/A;   Family History  Problem Relation Age of Onset  . GER disease Mother   . Heart disease Father    Social History:  reports that she has never smoked. She has never used smokeless tobacco. She reports that she does not drink alcohol or use illicit drugs. Allergies:  Allergies  Allergen Reactions  . Codeine Other (See Comments)    unknown  . Penicillins Other (See Comments)    unknown   Medications Prior to Admission  Medication Sig Dispense Refill  . carbidopa-levodopa (SINEMET IR) 25-100 MG per tablet TAKE ONE AND ONE-HALF TABLETS BY MOUTH 4 TIMES DAILY. TAKE AT 6 AM, 10AM, 2PM AND 6PM 180 tablet 5  . Ferrous Sulfate (IRON) 325 (65 FE) MG TABS Take 1 capsule by mouth daily. 30 each 6  . furosemide (LASIX) 20 MG tablet Take 20 mg by mouth 2 (two) times daily as needed for fluid.     Marland Kitchen HYDROcodone-acetaminophen (NORCO/VICODIN) 5-325 MG per tablet Take 1 tablet by mouth 3 (three) times daily as needed for severe pain. For neck pain (Patient not taking: Reported on 12/07/2014) 90 tablet 0  . MAGNESIUM PO Take 1 tablet by mouth daily.    . Multiple Vitamins-Minerals (MULTIVITAMIN WITH MINERALS) tablet Take  1 tablet by mouth daily.    . pantoprazole (PROTONIX) 40 MG tablet Take 1 tablet (40 mg total) by mouth 2  (two) times daily. (Patient not taking: Reported on 12/07/2014) 60 tablet 6  . polyethylene glycol powder (GLYCOLAX/MIRALAX) powder Take 17 g by mouth 2 (two) times daily as needed. (Patient not taking: Reported on 12/07/2014) 3350 g 3  . pramipexole (MIRAPEX) 1.5 MG tablet Take 1 tablet (1.5 mg total) by mouth 3 (three) times daily. 90 tablet 5  . senna (SENOKOT) 8.6 MG TABS tablet Take 2 tablets (17.2 mg total) by mouth daily. (Patient not taking: Reported on 12/07/2014) 60 each 3    Home: Home Living Family/patient expects to be discharged to:: Private residence Living Arrangements: Alone Available Help at Discharge: Available PRN/intermittently, Family Type of Home: House Home Access: [place]: One [place]: Environmental consultant - 4 wheels, Bedside commode, Tub bench, Wheelchair - manual, Electric scooter Additional Comments: pt lives alone, brothers get her groceries for her, she has Meals on Wheels for lunch and one of her brothers brings her supper.  Functional History: Prior Function Level of Independence: Independent with assistive device(s) Comments: occasionally has "freezing" spells when she has to limit mobility due to potential to fall.  Functional Status:  Mobility: Bed Mobility Overal bed mobility: Needs Assistance Bed Mobility: Supine to Sit, Sit to Supine, Rolling Rolling: Min assist (with rail) Supine to sit: Mod assist Sit to supine: Mod assist General bed mobility comments: assist for lifting trunk and scooting to edge of bed to sit; assist for repositioning trunk to left in supine and cues w/bed in trendelenberg to scoot to [facility] Transfers Overall transfer level: Needs assistance Equipment used: Rolling walker (2 wheeled) Transfers: Sit to/from Stand Sit to Stand: Min assist General transfer comment: initially attempts w/o assist pt lands back on bed, then assist for anterior weight shift  Ambulation/Gait Ambulation/Gait  assistance: Min assist Ambulation Distance (Feet): 130 Feet Assistive device: Rolling walker (2 wheeled) Gait Pattern/deviations: Step-through pattern, Decreased stride length, Shuffle General Gait Details: right trunk lean with head/neck dystonia and LOB x 2-3 to right min/mod assist to recover, improved while walking.    ADL:    Cognition: Cognition Overall Cognitive Status: No family/caregiver present to determine baseline cognitive functioning Orientation Level: Oriented X4 Cognition Arousal/Alertness: Awake/alert Behavior During Therapy: WFL for tasks assessed/performed Overall Cognitive Status: No family/caregiver present to determine baseline cognitive functioning  Physical Exam: Blood pressure 122/61, pulse 80, temperature 97.7 F (36.5 C), temperature source Oral, resp. rate 18, height 5' 4"  (1.626 m), weight 80.65 kg (177 lb 12.8 oz), SpO2 96 %. Physical Exam HENT:  Head: Normocephalic.  Eyes: EOM are normal.  Neck: Normal range of motion. Neck supple. No thyromegaly present.  Cardiovascular: Normal rate and regular rhythm.  Respiratory: Effort normal and breath sounds normal. No respiratory distress.  GI: Soft. Bowel sounds are normal. She exhibits no distension.  Neurological: She is alert.  Makes good eye contact with examiner. Appropriate to conversation. Speech is a bit dysarthric but intelligible. She easily becomes anxious during exam. Follows simple commands. She can provide her name and age as well as date of birth. Continued pill rolling tremor bilateral UE. Persistent essential tremor in both legs. Speech halting but intelligible. Reasonable insight and awareness. Remembered me from her last stay  Skin:  Erythema over anterior shin lower extremities. Palpable pedal pulses. Left leg more involved and tender than RLE. No obvious skin breakdown  Lab Results Last 48 Hours    Results for orders placed or performed during the hospital encounter of  12/07/14 (from the past 48 hour(s))  Culture, blood (routine x 2) Status: None (Preliminary result)   Collection Time: 12/07/14 3:55 AM  Result Value Ref Range   Specimen Description BLOOD RIGHT ARM    Special Requests BOTTLES DRAWN AEROBIC AND ANAEROBIC 10CC EACH    Culture       BLOOD CULTURE RECEIVED NO GROWTH TO DATE CULTURE WILL BE HELD FOR 5 DAYS BEFORE ISSUING A FINAL NEGATIVE REPORT Note: Culture results may be compromised due to an excessive volume of blood received in culture bottles. Performed at [facility]    Report Status PENDING   Culture, blood (routine x 2) Status: None (Preliminary result)   Collection Time: 12/07/14 4:00 AM  Result Value Ref Range   Specimen Description BLOOD RIGHT HAND    Special Requests BOTTLES DRAWN AEROBIC ONLY 10CC    Culture       BLOOD CULTURE RECEIVED NO GROWTH TO DATE CULTURE WILL BE HELD FOR 5 DAYS BEFORE ISSUING A FINAL NEGATIVE REPORT Note: Culture results may be compromised due to an excessive volume of blood received in culture bottles. Performed at [facility]    Report Status PENDING   CBC with Differential/Platelet Status: Abnormal   Collection Time: 12/07/14 4:07 AM  Result Value Ref Range   WBC 9.0 4.0 - 10.5 K/uL   RBC 4.58 3.87 - 5.11 MIL/uL   Hemoglobin 11.6 (L) 12.0 - 15.0 g/dL   HCT 36.9 36.0 - 46.0 %   MCV 80.6 78.0 - 100.0 fL   MCH 25.3 (L) 26.0 - 34.0 pg   MCHC 31.4 30.0 - 36.0 g/dL   RDW 17.7 (H) 11.5 - 15.5 %   Platelets 153 150 - 400 K/uL    Comment: SPECIMEN CHECKED FOR CLOTS REPEATED TO VERIFY PLATELET COUNT CONFIRMED BY SMEAR    Neutrophils Relative % 76 43 - 77 %   Lymphocytes Relative 13 12 - 46 %   Monocytes Relative 9 3 - 12 %   Eosinophils Relative 2 0 - 5 %   Basophils Relative 0 0 - 1 %   Band Neutrophils 0 0 - 10 %   Metamyelocytes  Relative 0 %   Myelocytes 0 %   Promyelocytes Absolute 0 %   Blasts 0 %   nRBC 0 0 /100 WBC   Neutro Abs 6.8 1.7 - 7.7 K/uL   Lymphs Abs 1.2 0.7 - 4.0 K/uL   Monocytes Absolute 0.8 0.1 - 1.0 K/uL   Eosinophils Absolute 0.2 0.0 - 0.7 K/uL   Basophils Absolute 0.0 0.0 - 0.1 K/uL  Basic metabolic panel Status: Abnormal   Collection Time: 12/07/14 4:07 AM  Result Value Ref Range   Sodium 141 135 - 145 mmol/L   Potassium 3.8 3.5 - 5.1 mmol/L   Chloride 109 96 - 112 mmol/L   CO2 26 19 - 32 mmol/L   Glucose, Bld 102 (H) 70 - 99 mg/dL   BUN 16 6 - 23 mg/dL   Creatinine, Ser 0.75 0.50 - 1.10 mg/dL   Calcium 8.8 8.4 - 10.5 mg/dL   GFR calc non Af Amer 81 (L) >90 mL/min   GFR calc Af Amer >90 >90 mL/min    Comment: (NOTE) The eGFR has been calculated using the CKD EPI equation. This calculation has not been validated in all clinical situations. eGFR's persistently <90 mL/min signify possible  Chronic Kidney Disease.    Anion gap 6 5 - 15  I-Stat CG4 Lactic Acid, ED Status: None   Collection Time: 12/07/14 4:08 AM  Result Value Ref Range   Lactic Acid, Venous 0.56 0.5 - 2.0 mmol/L  Urinalysis, Routine w reflex microscopic Status: Abnormal   Collection Time: 12/07/14 6:05 AM  Result Value Ref Range   Color, Urine YELLOW YELLOW   APPearance CLOUDY (A) CLEAR   Specific Gravity, Urine 1.019 1.005 - 1.030   pH 6.5 5.0 - 8.0   Glucose, UA NEGATIVE NEGATIVE mg/dL   Hgb urine dipstick NEGATIVE NEGATIVE   Bilirubin Urine NEGATIVE NEGATIVE   Ketones, ur NEGATIVE NEGATIVE mg/dL   Protein, ur NEGATIVE NEGATIVE mg/dL   Urobilinogen, UA 1.0 0.0 - 1.0 mg/dL   Nitrite NEGATIVE NEGATIVE   Leukocytes, UA MODERATE (A) NEGATIVE  Urine microscopic-add on Status: Abnormal   Collection Time: 12/07/14 6:05 AM  Result Value  Ref Range   Squamous Epithelial / LPF MANY (A) RARE   WBC, UA 7-10 <3 WBC/hpf   Bacteria, UA MANY (A) RARE   Crystals CA OXALATE CRYSTALS (A) NEGATIVE  Comprehensive metabolic panel Status: Abnormal   Collection Time: 12/07/14 7:50 AM  Result Value Ref Range   Sodium 139 135 - 145 mmol/L   Potassium 3.5 3.5 - 5.1 mmol/L   Chloride 107 96 - 112 mmol/L   CO2 26 19 - 32 mmol/L   Glucose, Bld 107 (H) 70 - 99 mg/dL   BUN 12 6 - 23 mg/dL   Creatinine, Ser 0.71 0.50 - 1.10 mg/dL   Calcium 8.4 8.4 - 10.5 mg/dL   Total Protein 5.8 (L) 6.0 - 8.3 g/dL   Albumin 3.3 (L) 3.5 - 5.2 g/dL   AST 26 0 - 37 U/L   ALT 18 0 - 35 U/L   Alkaline Phosphatase 69 39 - 117 U/L   Total Bilirubin 1.2 0.3 - 1.2 mg/dL   GFR calc non Af Amer 83 (L) >90 mL/min   GFR calc Af Amer >90 >90 mL/min    Comment: (NOTE) The eGFR has been calculated using the CKD EPI equation. This calculation has not been validated in all clinical situations. eGFR's persistently <90 mL/min signify possible Chronic Kidney Disease.    Anion gap 6 5 - 15  CBC with Differential/Platelet Status: Abnormal   Collection Time: 12/07/14 7:50 AM  Result Value Ref Range   WBC 9.6 4.0 - 10.5 K/uL   RBC 4.57 3.87 - 5.11 MIL/uL   Hemoglobin 11.7 (L) 12.0 - 15.0 g/dL   HCT 36.7 36.0 - 46.0 %   MCV 80.3 78.0 - 100.0 fL   MCH 25.6 (L) 26.0 - 34.0 pg   MCHC 31.9 30.0 - 36.0 g/dL   RDW 17.6 (H) 11.5 - 15.5 %   Platelets 149 (L) 150 - 400 K/uL   Neutrophils Relative % 80 (H) 43 - 77 %   Neutro Abs 7.7 1.7 - 7.7 K/uL   Lymphocytes Relative 10 (L) 12 - 46 %   Lymphs Abs 0.9 0.7 - 4.0 K/uL   Monocytes Relative 8 3 - 12 %   Monocytes Absolute 0.8 0.1 - 1.0 K/uL   Eosinophils Relative 2 0 - 5 %   Eosinophils Absolute 0.2 0.0 - 0.7 K/uL   Basophils Relative 0 0 - 1 %    Basophils Absolute 0.0 0.0 - 0.1 K/uL  CBC Status: Abnormal   Collection Time: 12/08/14 5:14 AM  Result Value Ref Range  WBC 7.1 4.0 - 10.5 K/uL   RBC 4.35 3.87 - 5.11 MIL/uL   Hemoglobin 11.1 (L) 12.0 - 15.0 g/dL   HCT 35.6 (L) 36.0 - 46.0 %   MCV 81.8 78.0 - 100.0 fL   MCH 25.5 (L) 26.0 - 34.0 pg   MCHC 31.2 30.0 - 36.0 g/dL   RDW 17.7 (H) 11.5 - 15.5 %   Platelets 133 (L) 150 - 400 K/uL  Basic metabolic panel Status: Abnormal   Collection Time: 12/08/14 5:14 AM  Result Value Ref Range   Sodium 139 135 - 145 mmol/L   Potassium 4.1 3.5 - 5.1 mmol/L   Chloride 107 96 - 112 mmol/L   CO2 25 19 - 32 mmol/L   Glucose, Bld 131 (H) 70 - 99 mg/dL   BUN 14 6 - 23 mg/dL   Creatinine, Ser 0.81 0.50 - 1.10 mg/dL   Calcium 8.5 8.4 - 10.5 mg/dL   GFR calc non Af Amer 70 (L) >90 mL/min   GFR calc Af Amer 81 (L) >90 mL/min    Comment: (NOTE) The eGFR has been calculated using the CKD EPI equation. This calculation has not been validated in all clinical situations. eGFR's persistently <90 mL/min signify possible Chronic Kidney Disease.    Anion gap 7 5 - 15      Imaging Results (Last 48 hours)    No results found.       Medical Problem List and Plan: 1. Functional deficits secondary to pseudo-exacerbation of Parkinson's disease related to bilateral lower extremity cellulitis. Cipro as directed for cellulitis 7 days and stop 2. DVT Prophylaxis/Anticoagulation: SCDs. Monitor for any signs of DVT. Check vascular study 3. Pain Management: Oxycodone as needed. Monitor with increased mobility 4. Mood/anxiety. Ativan 0.5 mg as needed. 5. Neuropsych: This patient is capable of making decisions on her own behalf. 6. Skin/Wound Care: Routine skin checks 7. Fluids/Electrolytes/Nutrition: Strict I and O's with follow-up chemistries 8. Parkinson's disease. Continue Mirapex 1.5 mg 3  times a day as well as Sinemet 4 times a day as directed     Post Admission Physician Evaluation: 1. Functional deficits secondary to Pseudo-exacerbation of Parkinson's disease secondary to bilateral lower extremity cellulitis. 2. Patient is admitted to receive collaborative, interdisciplinary care between the physiatrist, rehab nursing staff, and therapy team. 3. Patient's level of medical complexity and substantial therapy needs in context of that medical necessity cannot be provided at a lesser intensity of care such as a [name]. 4. Patient has experienced substantial functional loss from his/her baseline which was documented above under the "Functional History" and "Functional Status" headings. Judging by the patient's diagnosis, physical exam, and functional history, the patient has potential for functional progress which will result in measurable gains while on inpatient rehab. These gains will be of substantial and practical use upon discharge in facilitating mobility and self-care at the household level. 5. Physiatrist will provide 24 hour management of medical needs as well as oversight of the therapy plan/treatment and provide guidance as appropriate regarding the interaction of the two. 6. 24 hour rehab nursing will assist with bladder management, bowel management, safety, skin/wound care, disease management, medication administration, pain management and patient education and help integrate therapy concepts, techniques,education, etc. 7. PT will assess and treat for/with: pre gait, gait training, endurance , safety, equipment, neuromuscular re education. Goals are: Sup. 8. OT will assess and treat for/with: ADLs, Cognitive perceptual skills, Neuromuscular re education, safety, endurance, equipment. Goals are: Sup ADL. Therapy may proceed with  showering this patient. 9. SLP will assess and treat for/with: NA. Goals are: NA. 10. Case Management and Social Worker will assess and treat  for psychological issues and discharge planning. 11. Team conference will be held weekly to assess progress toward goals and to determine barriers to discharge. 12. Patient will receive at least 3 hours of therapy per day at least 5 days per week. 13. ELOS: 10-14d  14. Prognosis: good     Charlett Blake M.D. [facility] FAAPM&R (Sports Med, Neuromuscular Med) Diplomate Am Board of Electrodiagnostic Med  12/08/2014

## 2014-12-08 NOTE — Progress Notes (Signed)
Received pt. As a transfer from [place].Pt. Was oriented to the unit and the rehab routine.Safety plan was explained,fall prevention plan was explained and sign by RN and Pt.Pt. Sin intact,without pressure sores.No equipment at the bedside at the moment.Keep monitoring pt. Closely and assessing her needs.Safety video was showed to pt. As well.

## 2014-12-08 NOTE — Consult Note (Signed)
Physical Medicine and Rehabilitation Consult Reason for Consult: Parkinson's disease Referring Physician: [facility]   HPI: [name] is a 74 y.o. right handed female with history of diastolic congestive heart failure, chronic anemia, advanced Parkinson's disease. Patient well known to rehabilitation services from admission January 2015 for deconditioning related to Parkinson's disease with pseudo-exacerbation. Patient lives alone and Independent prior to admission with assistive device. Her brother does her grocery shopping. She has Meals on Wheels for lunch. Presented 12/07/2014 with lower extremity pain left greater than right 2 weeks associated with mild erythema and warmth to touch. No fever or chills reported. Preliminary venous Doppler studies negative for DVT. Suspect cellulitis placed on vancomycin.. She remains on Mirapex as well as Sinemet as prior to admission for history of Parkinson's. Physical therapy evaluation completed 12/07/2014 with recommendations of physical medicine rehabilitation consult.   Review of Systems  Gastrointestinal: Positive for constipation.  Neurological: Positive for tremors and weakness.  All other systems reviewed and are negative.  Past Medical History  Diagnosis Date  . Parkinson's disease   . Hypertension   . Arthritis   . Anemia   . Cellulitis of lower leg 12/24/2011  . Acute bronchitis   . Other abnormal blood chemistry   . Chronic systolic heart failure   . Acute on chronic systolic heart failure   . Cellulitis and abscess of leg, except foot   . Hypotension, unspecified   . Helicobacter pylori (H. pylori)   . Acute gastric ulcer with hemorrhage, without mention of obstruction    Past Surgical History  Procedure Laterality Date  . Hip arthroscopy w/ labral repair    . Thyroidectomy    . Esophagogastroduodenoscopy  09/26/2011    Procedure: ESOPHAGOGASTRODUODENOSCOPY (EGD);  Surgeon: Zenovia Jarred, MD;  Location: [facility] ENDOSCOPY;  Service:  Gastroenterology;  Laterality: N/A;  To be done at bedside.  . Esophagogastroduodenoscopy N/A 09/30/2013    Procedure: ESOPHAGOGASTRODUODENOSCOPY (EGD);  Surgeon: [name], MD;  Location: [place];  Service: Endoscopy;  Laterality: N/A;  . Esophagogastroduodenoscopy N/A 11/25/2013    Procedure: ESOPHAGOGASTRODUODENOSCOPY (EGD);  Surgeon: [name], MD;  Location: [facility];  Service: Endoscopy;  Laterality: N/A;   Family History  Problem Relation Age of Onset  . GER disease Mother   . Heart disease Father    Social History:  reports that she has never smoked. She has never used smokeless tobacco. She reports that she does not drink alcohol or use illicit drugs. Allergies:  Allergies  Allergen Reactions  . Codeine Other (See Comments)    unknown  . Penicillins Other (See Comments)    unknown   Medications Prior to Admission  Medication Sig Dispense Refill  . carbidopa-levodopa (SINEMET IR) 25-100 MG per tablet TAKE ONE AND ONE-HALF TABLETS BY MOUTH 4 TIMES DAILY. TAKE AT 6 AM, 10AM, 2PM AND 6PM 180 tablet 5  . Ferrous Sulfate (IRON) 325 (65 FE) MG TABS Take 1 capsule by mouth daily. 30 each 6  . furosemide (LASIX) 20 MG tablet Take 20 mg by mouth 2 (two) times daily as needed for fluid.     Marland Kitchen HYDROcodone-acetaminophen (NORCO/VICODIN) 5-325 MG per tablet Take 1 tablet by mouth 3 (three) times daily as needed for severe pain. For neck pain (Patient not taking: Reported on 12/07/2014) 90 tablet 0  . MAGNESIUM PO Take 1 tablet by mouth daily.    . Multiple Vitamins-Minerals (MULTIVITAMIN WITH MINERALS) tablet Take 1 tablet by mouth daily.    Marland Kitchen  pantoprazole (PROTONIX) 40 MG tablet Take 1 tablet (40 mg total) by mouth 2 (two) times daily. (Patient not taking: Reported on 12/07/2014) 60 tablet 6  . polyethylene glycol powder (GLYCOLAX/MIRALAX) powder Take 17 g by mouth 2 (two) times daily as needed. (Patient not taking: Reported on 12/07/2014) 3350 g 3  . pramipexole (MIRAPEX) 1.5  MG tablet Take 1 tablet (1.5 mg total) by mouth 3 (three) times daily. 90 tablet 5  . senna (SENOKOT) 8.6 MG TABS tablet Take 2 tablets (17.2 mg total) by mouth daily. (Patient not taking: Reported on 12/07/2014) 60 each 3    Home: Home Living Family/patient expects to be discharged to:: Private residence Living Arrangements: Alone Available Help at Discharge: Available PRN/intermittently, Family Type of Home: House Home Access: [place]: One [place]: Environmental consultant - 4 wheels, Bedside commode, Tub bench, Wheelchair - manual, Electric scooter Additional Comments: pt lives alone, brothers get her groceries for her, she has Meals on Wheels for lunch and one of her brothers brings her supper.  Functional History: Prior Function Level of Independence: Independent with assistive device(s) Comments: occasionally has "freezing" spells when she has to limit mobility due to potential to fall. Functional Status:  Mobility: Bed Mobility Overal bed mobility: Needs Assistance Bed Mobility: Supine to Sit, Sit to Supine, Rolling Rolling: Min assist (with rail) Supine to sit: Mod assist Sit to supine: Mod assist General bed mobility comments: assist for lifting trunk and scooting to edge of bed to sit; assist for repositioning trunk to left in supine and cues w/bed in trendelenberg to scoot to [employer] Transfers Overall transfer level: Needs assistance Equipment used: Rolling walker (2 wheeled) Transfers: Sit to/from Stand Sit to Stand: Min assist General transfer comment: initially attempts w/o assist pt lands back on bed, then assist for anterior weight shift  Ambulation/Gait Ambulation/Gait assistance: Min assist Ambulation Distance (Feet): 130 Feet Assistive device: Rolling walker (2 wheeled) Gait Pattern/deviations: Step-through pattern, Decreased stride length, Shuffle General Gait Details: right trunk lean with head/neck dystonia and LOB x 2-3 to right min/mod assist to  recover, improved while walking.    ADL:    Cognition: Cognition Overall Cognitive Status: No family/caregiver present to determine baseline cognitive functioning Orientation Level: Oriented X4 Cognition Arousal/Alertness: Awake/alert Behavior During Therapy: WFL for tasks assessed/performed Overall Cognitive Status: No family/caregiver present to determine baseline cognitive functioning  Blood pressure 124/54, pulse 63, temperature 97.8 F (36.6 C), temperature source Oral, resp. rate 16, height 5\' 4"  (1.626 m), weight 80.65 kg (177 lb 12.8 oz), SpO2 96 %. Physical Exam  HENT:  Head: Normocephalic.  Eyes: EOM are normal.  Neck: Normal range of motion. Neck supple. No thyromegaly present.  Cardiovascular: Normal rate and regular rhythm.   Respiratory: Effort normal and breath sounds normal. No respiratory distress.  GI: Soft. Bowel sounds are normal. She exhibits no distension.  Neurological: She is alert.  Makes good eye contact with examiner. Appropriate to conversation. Speech is a bit dysarthric but intelligible. She easily becomes anxious during exam. Follows simple commands. She can provide her name and age as well as date of birth. Continued pill rolling tremor bilateral UE. Persistent essential tremor in both legs. Speech halting but intelligible. Reasonable insight and awareness. Remembered me from her last stay  Skin:  Erythema over anterior shin lower extremities. Palpable pedal pulses. Left leg more involved and tender than RLE. No obvious skin breakdown    Results for orders placed or performed during the hospital encounter of  12/07/14 (from the past 24 hour(s))  Urinalysis, Routine w reflex microscopic     Status: Abnormal   Collection Time: 12/07/14  6:05 AM  Result Value Ref Range   Color, Urine YELLOW YELLOW   APPearance CLOUDY (A) CLEAR   Specific Gravity, Urine 1.019 1.005 - 1.030   pH 6.5 5.0 - 8.0   Glucose, UA NEGATIVE NEGATIVE mg/dL   Hgb urine dipstick  NEGATIVE NEGATIVE   Bilirubin Urine NEGATIVE NEGATIVE   Ketones, ur NEGATIVE NEGATIVE mg/dL   Protein, ur NEGATIVE NEGATIVE mg/dL   Urobilinogen, UA 1.0 0.0 - 1.0 mg/dL   Nitrite NEGATIVE NEGATIVE   Leukocytes, UA MODERATE (A) NEGATIVE  Urine microscopic-add on     Status: Abnormal   Collection Time: 12/07/14  6:05 AM  Result Value Ref Range   Squamous Epithelial / LPF MANY (A) RARE   WBC, UA 7-10 <3 WBC/hpf   Bacteria, UA MANY (A) RARE   Crystals CA OXALATE CRYSTALS (A) NEGATIVE  Comprehensive metabolic panel     Status: Abnormal   Collection Time: 12/07/14  7:50 AM  Result Value Ref Range   Sodium 139 135 - 145 mmol/L   Potassium 3.5 3.5 - 5.1 mmol/L   Chloride 107 96 - 112 mmol/L   CO2 26 19 - 32 mmol/L   Glucose, Bld 107 (H) 70 - 99 mg/dL   BUN 12 6 - 23 mg/dL   Creatinine, Ser 0.71 0.50 - 1.10 mg/dL   Calcium 8.4 8.4 - 10.5 mg/dL   Total Protein 5.8 (L) 6.0 - 8.3 g/dL   Albumin 3.3 (L) 3.5 - 5.2 g/dL   AST 26 0 - 37 U/L   ALT 18 0 - 35 U/L   Alkaline Phosphatase 69 39 - 117 U/L   Total Bilirubin 1.2 0.3 - 1.2 mg/dL   GFR calc non Af Amer 83 (L) >90 mL/min   GFR calc Af Amer >90 >90 mL/min   Anion gap 6 5 - 15  CBC with Differential/Platelet     Status: Abnormal   Collection Time: 12/07/14  7:50 AM  Result Value Ref Range   WBC 9.6 4.0 - 10.5 K/uL   RBC 4.57 3.87 - 5.11 MIL/uL   Hemoglobin 11.7 (L) 12.0 - 15.0 g/dL   HCT 36.7 36.0 - 46.0 %   MCV 80.3 78.0 - 100.0 fL   MCH 25.6 (L) 26.0 - 34.0 pg   MCHC 31.9 30.0 - 36.0 g/dL   RDW 17.6 (H) 11.5 - 15.5 %   Platelets 149 (L) 150 - 400 K/uL   Neutrophils Relative % 80 (H) 43 - 77 %   Neutro Abs 7.7 1.7 - 7.7 K/uL   Lymphocytes Relative 10 (L) 12 - 46 %   Lymphs Abs 0.9 0.7 - 4.0 K/uL   Monocytes Relative 8 3 - 12 %   Monocytes Absolute 0.8 0.1 - 1.0 K/uL   Eosinophils Relative 2 0 - 5 %   Eosinophils Absolute 0.2 0.0 - 0.7 K/uL   Basophils Relative 0 0 - 1 %   Basophils Absolute 0.0 0.0 - 0.1 K/uL   No results  found.  Assessment/Plan: Diagnosis: pseudo-exacerbation of Parkinson's Disease related to bilateral LE cellulitis 1. Does the need for close, 24 hr/day medical supervision in concert with the patient's rehab needs make it unreasonable for this patient to be served in a less intensive setting? Yes 2. Co-Morbidities requiring supervision/potential complications: CHF, ABLA 3. Due to bladder management, bowel management, safety, skin/wound care, disease  management, medication administration, pain management and patient education, does the patient require 24 hr/day rehab nursing? Yes 4. Does the patient require coordinated care of a physician, rehab nurse, PT (1-2 hrs/day, 5 days/week) and OT (1-2 hrs/day, 5 days/week) to address physical and functional deficits in the context of the above medical diagnosis(es)? Yes Addressing deficits in the following areas: balance, endurance, locomotion, strength, transferring, bowel/bladder control, bathing, dressing, feeding, grooming, toileting and psychosocial support 5. Can the patient actively participate in an intensive therapy program of at least 3 hrs of therapy per day at least 5 days per week? Yes 6. The potential for patient to make measurable gains while on inpatient rehab is excellent 7. Anticipated functional outcomes upon discharge from inpatient rehab are modified independent  with PT, modified independent and supervision with OT, n/a with SLP. 8. Estimated rehab length of stay to reach the above functional goals is: 11-14 9. Does the patient have adequate social supports and living environment to accommodate these discharge functional goals? Yes 10. Anticipated D/C setting: Home 11. Anticipated post D/C treatments: HH therapy and Outpatient therapy 12. Overall Rehab/Functional Prognosis: excellent  RECOMMENDATIONS: This patient's condition is appropriate for continued rehabilitative care in the following setting: CIR Patient has agreed to  participate in recommended program. Yes Note that insurance prior authorization may be required for reimbursement for recommended care.  Comment: Pt is well known to me. She's a very hard worker and should be able to meet the goals I've projected. Brother helps a bit at home as well.   [name], MD, [facility] 12/08/2014     12/08/2014

## 2014-12-08 NOTE — Discharge Instructions (Signed)

## 2014-12-08 NOTE — Progress Notes (Signed)
Rehab admissions - Evaluated for possible admission.  Patient known to rehab team from previous inpatient rehab admission.  She is agreeable to admit to acute inpatient rehab.  Bed available and will admit to inpatient rehab today.  Call me for questions.  #[phone]

## 2014-12-08 NOTE — Progress Notes (Signed)
[name] Staggers, MD Physician Signed Physical Medicine and Rehabilitation Consult Note 12/08/2014 5:48 AM  Related encounter: ED to Hosp-Admission (Discharged) from 12/07/2014 in [place] Collapse All        Physical Medicine and Rehabilitation Consult Reason for Consult: Parkinson's disease Referring Physician: [facility]   HPI: [name] is a 74 y.o. right handed female with history of diastolic congestive heart failure, chronic anemia, advanced Parkinson's disease. Patient well known to rehabilitation services from admission January 2015 for deconditioning related to Parkinson's disease with pseudo-exacerbation. Patient lives alone and Independent prior to admission with assistive device. Her brother does her grocery shopping. She has Meals on Wheels for lunch. Presented 12/07/2014 with lower extremity pain left greater than right 2 weeks associated with mild erythema and warmth to touch. No fever or chills reported. Preliminary venous Doppler studies negative for DVT. Suspect cellulitis placed on vancomycin.. She remains on Mirapex as well as Sinemet as prior to admission for history of Parkinson's. Physical therapy evaluation completed 12/07/2014 with recommendations of physical medicine rehabilitation consult.   Review of Systems  Gastrointestinal: Positive for constipation.  Neurological: Positive for tremors and weakness.  All other systems reviewed and are negative.  Past Medical History  Diagnosis Date  . Parkinson's disease   . Hypertension   . Arthritis   . Anemia   . Cellulitis of lower leg 12/24/2011  . Acute bronchitis   . Other abnormal blood chemistry   . Chronic systolic heart failure   . Acute on chronic systolic heart failure   . Cellulitis and abscess of leg, except foot   . Hypotension, unspecified   . Helicobacter pylori (H. pylori)   . Acute gastric ulcer with  hemorrhage, without mention of obstruction    Past Surgical History  Procedure Laterality Date  . Hip arthroscopy w/ labral repair    . Thyroidectomy    . Esophagogastroduodenoscopy  09/26/2011    Procedure: ESOPHAGOGASTRODUODENOSCOPY (EGD); Surgeon: Zenovia Jarred, MD; Location: [facility] ENDOSCOPY; Service: Gastroenterology; Laterality: N/A; To be done at bedside.  . Esophagogastroduodenoscopy N/A 09/30/2013    Procedure: ESOPHAGOGASTRODUODENOSCOPY (EGD); Surgeon: [name], MD; Location: [facility]; Service: Endoscopy; Laterality: N/A;  . Esophagogastroduodenoscopy N/A 11/25/2013    Procedure: ESOPHAGOGASTRODUODENOSCOPY (EGD); Surgeon: [name], MD; Location: [facility] ENDOSCOPY; Service: Endoscopy; Laterality: N/A;   Family History  Problem Relation Age of Onset  . GER disease Mother   . Heart disease Father    Social History:  reports that she has never smoked. She has never used smokeless tobacco. She reports that she does not drink alcohol or use illicit drugs. Allergies:  Allergies  Allergen Reactions  . Codeine Other (See Comments)    unknown  . Penicillins Other (See Comments)    unknown   Medications Prior to Admission  Medication Sig Dispense Refill  . carbidopa-levodopa (SINEMET IR) 25-100 MG per tablet TAKE ONE AND ONE-HALF TABLETS BY MOUTH 4 TIMES DAILY. TAKE AT 6 AM, 10AM, 2PM AND 6PM 180 tablet 5  . Ferrous Sulfate (IRON) 325 (65 FE) MG TABS Take 1 capsule by mouth daily. 30 each 6  . furosemide (LASIX) 20 MG tablet Take 20 mg by mouth 2 (two) times daily as needed for fluid.     Marland Kitchen HYDROcodone-acetaminophen (NORCO/VICODIN) 5-325 MG per tablet Take 1 tablet by mouth 3 (three) times daily as needed for severe pain. For neck pain (Patient not taking: Reported on 12/07/2014) 90 tablet 0  .  MAGNESIUM PO Take 1 tablet by mouth daily.    . Multiple Vitamins-Minerals  (MULTIVITAMIN WITH MINERALS) tablet Take 1 tablet by mouth daily.    . pantoprazole (PROTONIX) 40 MG tablet Take 1 tablet (40 mg total) by mouth 2 (two) times daily. (Patient not taking: Reported on 12/07/2014) 60 tablet 6  . polyethylene glycol powder (GLYCOLAX/MIRALAX) powder Take 17 g by mouth 2 (two) times daily as needed. (Patient not taking: Reported on 12/07/2014) 3350 g 3  . pramipexole (MIRAPEX) 1.5 MG tablet Take 1 tablet (1.5 mg total) by mouth 3 (three) times daily. 90 tablet 5  . senna (SENOKOT) 8.6 MG TABS tablet Take 2 tablets (17.2 mg total) by mouth daily. (Patient not taking: Reported on 12/07/2014) 60 each 3    Home: Home Living Family/patient expects to be discharged to:: Private residence Living Arrangements: Alone Available Help at Discharge: Available PRN/intermittently, Family Type of Home: House Home Access: [place]: One [place]: Environmental consultant - 4 wheels, Bedside commode, Tub bench, Wheelchair - manual, Electric scooter Additional Comments: pt lives alone, brothers get her groceries for her, she has Meals on Wheels for lunch and one of her brothers brings her supper.  Functional History: Prior Function Level of Independence: Independent with assistive device(s) Comments: occasionally has "freezing" spells when she has to limit mobility due to potential to fall. Functional Status:  Mobility: Bed Mobility Overal bed mobility: Needs Assistance Bed Mobility: Supine to Sit, Sit to Supine, Rolling Rolling: Min assist (with rail) Supine to sit: Mod assist Sit to supine: Mod assist General bed mobility comments: assist for lifting trunk and scooting to edge of bed to sit; assist for repositioning trunk to left in supine and cues w/bed in trendelenberg to scoot to [facility] Transfers Overall transfer level: Needs assistance Equipment used: Rolling walker (2 wheeled) Transfers: Sit to/from Stand Sit to Stand: Min  assist General transfer comment: initially attempts w/o assist pt lands back on bed, then assist for anterior weight shift  Ambulation/Gait Ambulation/Gait assistance: Min assist Ambulation Distance (Feet): 130 Feet Assistive device: Rolling walker (2 wheeled) Gait Pattern/deviations: Step-through pattern, Decreased stride length, Shuffle General Gait Details: right trunk lean with head/neck dystonia and LOB x 2-3 to right min/mod assist to recover, improved while walking.    ADL:    Cognition: Cognition Overall Cognitive Status: No family/caregiver present to determine baseline cognitive functioning Orientation Level: Oriented X4 Cognition Arousal/Alertness: Awake/alert Behavior During Therapy: WFL for tasks assessed/performed Overall Cognitive Status: No family/caregiver present to determine baseline cognitive functioning  Blood pressure 124/54, pulse 63, temperature 97.8 F (36.6 C), temperature source Oral, resp. rate 16, height 5\' 4"  (1.626 m), weight 80.65 kg (177 lb 12.8 oz), SpO2 96 %. Physical Exam  HENT:  Head: Normocephalic.  Eyes: EOM are normal.  Neck: Normal range of motion. Neck supple. No thyromegaly present.  Cardiovascular: Normal rate and regular rhythm.  Respiratory: Effort normal and breath sounds normal. No respiratory distress.  GI: Soft. Bowel sounds are normal. She exhibits no distension.  Neurological: She is alert.  Makes good eye contact with examiner. Appropriate to conversation. Speech is a bit dysarthric but intelligible. She easily becomes anxious during exam. Follows simple commands. She can provide her name and age as well as date of birth. Continued pill rolling tremor bilateral UE. Persistent essential tremor in both legs. Speech halting but intelligible. Reasonable insight and awareness. Remembered me from her last stay  Skin:  Erythema over anterior shin lower extremities. Palpable pedal  pulses. Left leg more involved and tender than RLE. No  obvious skin breakdown     Lab Results Last 24 Hours    Results for orders placed or performed during the hospital encounter of 12/07/14 (from the past 24 hour(s))  Urinalysis, Routine w reflex microscopic Status: Abnormal   Collection Time: 12/07/14 6:05 AM  Result Value Ref Range   Color, Urine YELLOW YELLOW   APPearance CLOUDY (A) CLEAR   Specific Gravity, Urine 1.019 1.005 - 1.030   pH 6.5 5.0 - 8.0   Glucose, UA NEGATIVE NEGATIVE mg/dL   Hgb urine dipstick NEGATIVE NEGATIVE   Bilirubin Urine NEGATIVE NEGATIVE   Ketones, ur NEGATIVE NEGATIVE mg/dL   Protein, ur NEGATIVE NEGATIVE mg/dL   Urobilinogen, UA 1.0 0.0 - 1.0 mg/dL   Nitrite NEGATIVE NEGATIVE   Leukocytes, UA MODERATE (A) NEGATIVE  Urine microscopic-add on Status: Abnormal   Collection Time: 12/07/14 6:05 AM  Result Value Ref Range   Squamous Epithelial / LPF MANY (A) RARE   WBC, UA 7-10 <3 WBC/hpf   Bacteria, UA MANY (A) RARE   Crystals CA OXALATE CRYSTALS (A) NEGATIVE  Comprehensive metabolic panel Status: Abnormal   Collection Time: 12/07/14 7:50 AM  Result Value Ref Range   Sodium 139 135 - 145 mmol/L   Potassium 3.5 3.5 - 5.1 mmol/L   Chloride 107 96 - 112 mmol/L   CO2 26 19 - 32 mmol/L   Glucose, Bld 107 (H) 70 - 99 mg/dL   BUN 12 6 - 23 mg/dL   Creatinine, Ser 0.71 0.50 - 1.10 mg/dL   Calcium 8.4 8.4 - 10.5 mg/dL   Total Protein 5.8 (L) 6.0 - 8.3 g/dL   Albumin 3.3 (L) 3.5 - 5.2 g/dL   AST 26 0 - 37 U/L   ALT 18 0 - 35 U/L   Alkaline Phosphatase 69 39 - 117 U/L   Total Bilirubin 1.2 0.3 - 1.2 mg/dL   GFR calc non Af Amer 83 (L) >90 mL/min   GFR calc Af Amer >90 >90 mL/min   Anion gap 6 5 - 15  CBC with Differential/Platelet Status: Abnormal   Collection Time: 12/07/14 7:50 AM  Result Value Ref Range   WBC 9.6 4.0 - 10.5  K/uL   RBC 4.57 3.87 - 5.11 MIL/uL   Hemoglobin 11.7 (L) 12.0 - 15.0 g/dL   HCT 36.7 36.0 - 46.0 %   MCV 80.3 78.0 - 100.0 fL   MCH 25.6 (L) 26.0 - 34.0 pg   MCHC 31.9 30.0 - 36.0 g/dL   RDW 17.6 (H) 11.5 - 15.5 %   Platelets 149 (L) 150 - 400 K/uL   Neutrophils Relative % 80 (H) 43 - 77 %   Neutro Abs 7.7 1.7 - 7.7 K/uL   Lymphocytes Relative 10 (L) 12 - 46 %   Lymphs Abs 0.9 0.7 - 4.0 K/uL   Monocytes Relative 8 3 - 12 %   Monocytes Absolute 0.8 0.1 - 1.0 K/uL   Eosinophils Relative 2 0 - 5 %   Eosinophils Absolute 0.2 0.0 - 0.7 K/uL   Basophils Relative 0 0 - 1 %   Basophils Absolute 0.0 0.0 - 0.1 K/uL      Imaging Results (Last 48 hours)    No results found.    Assessment/Plan: Diagnosis: pseudo-exacerbation of Parkinson's Disease related to bilateral LE cellulitis 1. Does the need for close, 24 hr/day medical supervision in concert with the patient's rehab needs make it unreasonable for  this patient to be served in a less intensive setting? Yes 2. Co-Morbidities requiring supervision/potential complications: CHF, ABLA 3. Due to bladder management, bowel management, safety, skin/wound care, disease management, medication administration, pain management and patient education, does the patient require 24 hr/day rehab nursing? Yes 4. Does the patient require coordinated care of a physician, rehab nurse, PT (1-2 hrs/day, 5 days/week) and OT (1-2 hrs/day, 5 days/week) to address physical and functional deficits in the context of the above medical diagnosis(es)? Yes Addressing deficits in the following areas: balance, endurance, locomotion, strength, transferring, bowel/bladder control, bathing, dressing, feeding, grooming, toileting and psychosocial support 5. Can the patient actively participate in an intensive therapy program of at least 3 hrs of therapy per day at least 5 days per week? Yes 6. The potential  for patient to make measurable gains while on inpatient rehab is excellent 7. Anticipated functional outcomes upon discharge from inpatient rehab are modified independent with PT, modified independent and supervision with OT, n/a with SLP. 8. Estimated rehab length of stay to reach the above functional goals is: 11-14 9. Does the patient have adequate social supports and living environment to accommodate these discharge functional goals? Yes 10. Anticipated D/C setting: Home 11. Anticipated post D/C treatments: HH therapy and Outpatient therapy 12. Overall Rehab/Functional Prognosis: excellent  RECOMMENDATIONS: This patient's condition is appropriate for continued rehabilitative care in the following setting: CIR Patient has agreed to participate in recommended program. Yes Note that insurance prior authorization may be required for reimbursement for recommended care.  Comment: Pt is well known to me. She's a very hard worker and should be able to meet the goals I've projected. Brother helps a bit at home as well.   [name] Staggers, MD, [place] [facility] 12/08/2014     12/08/2014       Revision History     Date/Time User Provider Type Action   12/08/2014 10:05 AM [name] Staggers, MD Physician Sign   12/08/2014 6:29 AM Cathlyn Parsons, PA-C Physician Assistant [place]   View Details Report       Routing History     Date/Time From To Method   12/08/2014 10:05 AM [name] Staggers, MD [name] Staggers, MD In Basket   12/08/2014 10:05 AM [name] Staggers, MD Gayland Curry, DO [place]

## 2014-12-08 NOTE — Discharge Summary (Addendum)
Physician Discharge Summary  [name] ACZ:[id number] DOB: 1941-09-06 DOA: 12/07/2014  PCP: Hollace Kinnier, DO  Admit date: 12/07/2014 Discharge date: 12/08/2014  Recommendations for Outpatient Follow-up:  1. Pt will need to follow up with PCP in 2 weeks post discharge 2. Continue ciprofloxacin for cellulitis of the lower extremities and UTI for 7 more days post discharge 3. Pt to be discharged to [facility] rehabilitation center   Discharge Diagnoses:  Principal Problem:   Lower extremity pain Active Problems:   Parkinson's disease   Diastolic dysfunction, grade 2   Cellulitis   Cellulitis of left lower extremity  Discharge Condition: Stable  Diet recommendation: Heart healthy diet discussed in details   Brief narrative:    74 y.o. female with Parkinson's disease, diastolic dysfunction, chronic anemia presented to [facility] ED with main concern of bilateral LE pain, LLE > RLE, almost 2 weeks duration but much worse over the past 24 hours, 7/10 in severity, from knees below, associated with mild erythema and warmth to touch. No fevers, chills, no numbness or tingling.   In ED, pt noted to be hemodynamically stable, VSS. TRH asked to admit for further evaluation. Preliminary LE venous doppler negative for DVT.  Assessment/Plan:    Principal Problem:  Lower extremity pain, bilateral - agree with cellulitis based on physical exam  - started on Vancomycin but now transitioned to oral cipro for 7 more days  - overall better and pt reports less pain - erythema improving  Active Problems:   ? UTI - UA with moderate leukocytes but pt with no specific urinary symptoms  - Cipro should be adequate in coverage based on previous sensitivity report - no reason to get urine cultures as pt already on ABX   Parkinson's disease - continue home medical regimen   Diastolic dysfunction, grade 2 - on lasix at home and will continue here - currently compensated  - last EF in 2013 was 55 -  60%  Anemia of chronic disease, IDA - Hg stable, no signs of bleeding  Previous history of renal mass  - patient states that she was not able to get MRI done because of her Parkinson's disease - renal sonogram can be done in an outpatient setting   Code Status: Full.  Family Communication: plan of care discussed with the patient Disposition Plan: [facility]    IV access:  Peripheral IV  Procedures and diagnostic studies:   LE venous doppler 3/16 --> no evidence of DVT  Medical Consultants:  None  Other Consultants:  None  IAnti-Infectives:   Vancomycin 3/16 --> 3/17 Cipro 3/17 --> 7 more days post discharge       Discharge Exam: Filed Vitals:   12/08/14 0948  BP: 122/61  Pulse: 80  Temp: 97.7 F (36.5 C)  Resp: 18   Filed Vitals:   12/08/14 0142 12/08/14 0500 12/08/14 0513 12/08/14 0948  BP: 148/69  124/54 122/61  Pulse: 68  63 80  Temp: 97.7 F (36.5 C)  97.8 F (36.6 C) 97.7 F (36.5 C)  TempSrc: Oral  Oral Oral  Resp: 18  16 18   Height:      Weight:  80.65 kg (177 lb 12.8 oz)    SpO2: 98%  96% 96%    General: Pt is alert, follows commands appropriately, not in acute distress Cardiovascular: Regular rate and rhythm, S1/S2 +, no murmurs, no rubs, no gallops Respiratory: Clear to auscultation bilaterally, no wheezing, no crackles, no rhonchi Abdominal: Soft, non tender, non  distended, bowel sounds +, no guarding Extremities: anerior shins erythema with TTP but better, no cyanosis, pulses palpable bilaterally DP and PT  Discharge Instructions  Discharge Instructions    Diet - low sodium heart healthy    Complete by:  As directed      Increase activity slowly    Complete by:  As directed             Medication List    STOP taking these medications        HYDROcodone-acetaminophen 5-325 MG per tablet  Commonly known as:  NORCO/VICODIN     pantoprazole 40 MG tablet  Commonly known as:  PROTONIX     polyethylene  glycol powder powder  Commonly known as:  GLYCOLAX/MIRALAX     senna 8.6 MG Tabs tablet  Commonly known as:  SENOKOT      TAKE these medications        carbidopa-levodopa 25-100 MG per tablet  Commonly known as:  SINEMET IR  TAKE ONE AND ONE-HALF TABLETS BY MOUTH 4 TIMES DAILY. TAKE AT 6 AM, 10AM, 2PM AND 6PM     doxycycline 100 MG tablet  Commonly known as:  VIBRA-TABS  Take 1 tablet (100 mg total) by mouth every 12 (twelve) hours.     furosemide 20 MG tablet  Commonly known as:  LASIX  Take 20 mg by mouth 2 (two) times daily as needed for fluid.     Iron 325 (65 FE) MG Tabs  Take 1 capsule by mouth daily.     MAGNESIUM PO  Take 1 tablet by mouth daily.     multivitamin with minerals tablet  Take 1 tablet by mouth daily.     pramipexole 1.5 MG tablet  Commonly known as:  MIRAPEX  Take 1 tablet (1.5 mg total) by mouth 3 (three) times daily.           Follow-up Information    Follow up with REED, [name], DO.   Specialty:  Geriatric Medicine   Contact information:   [address] [place] [address] [phone]        The results of significant diagnostics from this hospitalization (including imaging, microbiology, ancillary and laboratory) are listed below for reference.     Microbiology: Recent Results (from the past 240 hour(s))  Culture, blood (routine x 2)     Status: None (Preliminary result)   Collection Time: 12/07/14  3:55 AM  Result Value Ref Range Status   Specimen Description BLOOD RIGHT ARM  Final   Special Requests BOTTLES DRAWN AEROBIC AND ANAEROBIC 10CC EACH  Final   Culture   Final           BLOOD CULTURE RECEIVED NO GROWTH TO DATE CULTURE WILL BE HELD FOR 5 DAYS BEFORE ISSUING A FINAL NEGATIVE REPORT Note: Culture results may be compromised due to an excessive volume of blood received in culture bottles. Performed at [facility]    Report Status PENDING  Incomplete  Culture, blood (routine x 2)     Status: None (Preliminary  result)   Collection Time: 12/07/14  4:00 AM  Result Value Ref Range Status   Specimen Description BLOOD RIGHT HAND  Final   Special Requests BOTTLES DRAWN AEROBIC ONLY 10CC  Final   Culture   Final           BLOOD CULTURE RECEIVED NO GROWTH TO DATE CULTURE WILL BE HELD FOR 5 DAYS BEFORE ISSUING A FINAL NEGATIVE REPORT Note: Culture results  may be compromised due to an excessive volume of blood received in culture bottles. Performed at [facility]    Report Status PENDING  Incomplete     Labs: Basic Metabolic Panel:  Recent Labs Lab 12/07/14 0407 12/07/14 0750 12/08/14 0514  NA 141 139 139  K 3.8 3.5 4.1  CL 109 107 107  CO2 26 26 25   GLUCOSE 102* 107* 131*  BUN 16 12 14   CREATININE 0.75 0.71 0.81  CALCIUM 8.8 8.4 8.5   Liver Function Tests:  Recent Labs Lab 12/07/14 0750  AST 26  ALT 18  ALKPHOS 69  BILITOT 1.2  PROT 5.8*  ALBUMIN 3.3*   CBC:  Recent Labs Lab 12/07/14 0407 12/07/14 0750 12/08/14 0514  WBC 9.0 9.6 7.1  NEUTROABS 6.8 7.7  --   HGB 11.6* 11.7* 11.1*  HCT 36.9 36.7 35.6*  MCV 80.6 80.3 81.8  PLT 153 149* 133*     SIGNED: Time coordinating discharge: Over 30 minutes  Faye Ramsay, MD  [facility] 12/08/2014, 11:19 AM Pager [phone]  If 7PM-7AM, please contact night-coverage [email or web address] Password [email or web address]

## 2014-12-08 NOTE — Progress Notes (Signed)
Retta Diones, RN Rehab Admission Coordinator Signed Physical Medicine and Rehabilitation PMR Pre-admission 12/08/2014 12:51 PM  Related encounter: ED to Hosp-Admission (Discharged) from 12/07/2014 in [place] [facility] Collapse All   PMR Admission Coordinator Pre-Admission Assessment  Patient: [name] is an 74 y.o., female MRN: [id number] DOB: 06/09/1941 Height: 5\' 4"  (162.6 cm) Weight: 80.65 kg (177 lb 12.8 oz)  Insurance Information HMO: No PPO: PCP: IPA: 80/20: OTHER:  PRIMARY: Medicare A/B Policy#: [id number] Subscriber: [name] CM Name: Phone#: Fax#:  Pre-Cert#: Employer: Retired Benefits: Phone #: Name: Checked in [place]. Date: )05/24/05 Deduct: $1288 Out of Pocket Max: none Life Max: unlimited CIR: 100% [name]: 100 days Outpatient: 80% Co-Pay: 20% Home Health: 100% Co-Pay: none DME: 80% Co-Pay: 20% Providers: patient's choice  SECONDARY: BCBS of [place] supp Policy#: [id number] Subscriber: [name] CM Name: Phone#: Fax#:  Pre-Cert#: Employer: Retired Benefits: Phone #: [phone] Name:  Eff. Date: Deduct: Out of Pocket Max: Life Max:  CIR: [name]:  Outpatient: Co-Pay:  Home Health: Co-Pay:  DME: Co-Pay:    Emergency Contact Information Contact Information    Name Relation Home Work Mobile   [name],[name]) Brother [phone]     [name], [name]  [phone]  [phone]     Current Medical History  Patient Admitting Diagnosis: Pseudo-exacerbation of Parkinson's Disease related to bilateral LE cellulitis  History of Present Illness: A 74 y.o. right  handed female with history of diastolic congestive heart failure, chronic anemia, advanced Parkinson's disease. Patient well known to rehabilitation services from admission January 2015 for deconditioning related to Parkinson's disease with pseudo-exacerbation. Patient lives alone and Independent prior to admission with assistive device. Her brother does her grocery shopping. She has Meals on Wheels for lunch. Presented 12/07/2014 with lower extremity pain left greater than right 2 weeks associated with mild erythema and warmth to touch. No fever or chills reported. Preliminary venous Doppler studies negative for DVT. Suspect cellulitis placed on vancomycin.. She remains on Mirapex as well as Sinemet as prior to admission for history of Parkinson's. Physical therapy evaluation completed 12/07/2014 with recommendations of physical medicine rehabilitation consult.   Past Medical History  Past Medical History  Diagnosis Date  . Parkinson's disease   . Hypertension   . Arthritis   . Anemia   . Cellulitis of lower leg 12/24/2011  . Acute bronchitis   . Other abnormal blood chemistry   . Chronic systolic heart failure   . Acute on chronic systolic heart failure   . Cellulitis and abscess of leg, except foot   . Hypotension, unspecified   . Helicobacter pylori (H. pylori)   . Acute gastric ulcer with hemorrhage, without mention of obstruction     Family History  family history includes GER disease in her mother; Heart disease in her father.  Prior Rehab/Hospitalizations: Was on CIR 01/15. Had [facility] therapies with [facility] recently.  Current Medications   Current facility-administered medications:  . acetaminophen (TYLENOL) tablet 650 mg, 650 mg, Oral, Q6H PRN **OR** acetaminophen (TYLENOL) suppository 650 mg, 650 mg, Rectal, Q6H PRN, Rise Patience, MD . carbidopa-levodopa (SINEMET IR) 25-100 MG per tablet immediate release 1.5 tablet, 1.5  tablet, Oral, 4 times per day, Rise Patience, MD, 1.5 tablet at 12/08/14 1031 . ciprofloxacin (CIPRO) tablet 500 mg, 500 mg, Oral, BID, Theodis Blaze, MD . ferrous sulfate tablet 325 mg, 325 mg, Oral, Daily, Rise Patience, MD, 325 mg at 12/08/14 1031 . furosemide (  LASIX) tablet 20 mg, 20 mg, Oral, BID PRN, Rise Patience, MD . LORazepam (ATIVAN) tablet 0.5 mg, 0.5 mg, Oral, BID PRN, Theodis Blaze, MD, 0.5 mg at 12/08/14 1049 . multivitamin with minerals tablet 1 tablet, 1 tablet, Oral, Daily, Rise Patience, MD, 1 tablet at 12/08/14 1031 . ondansetron (ZOFRAN) tablet 4 mg, 4 mg, Oral, Q6H PRN **OR** ondansetron (ZOFRAN) injection 4 mg, 4 mg, Intravenous, Q6H PRN, Rise Patience, MD . oxyCODONE (Oxy IR/ROXICODONE) immediate release tablet 5 mg, 5 mg, Oral, Q4H PRN, Rise Patience, MD, 5 mg at 12/07/14 1020 . pramipexole (MIRAPEX) tablet 1.5 mg, 1.5 mg, Oral, TID, Rise Patience, MD, 1.5 mg at 12/08/14 1049 . sodium chloride 0.9 % injection 3 mL, 3 mL, Intravenous, Q12H, Rise Patience, MD, 3 mL at 12/08/14 1032  Patients Current Diet: Diet Heart Diet - low sodium heart healthy  Precautions / Restrictions Precautions Precautions: Fall Restrictions Weight Bearing Restrictions: No   Prior Activity Level Household: Mostly homebound. Goes out 1 X every 2 weeks to church.   Home Assistive Devices / Equipment Home Assistive Devices/Equipment: None Home Equipment: Walker - 4 wheels, Bedside commode, Tub bench, Wheelchair - manual, Electric scooter  Prior Functional Level Prior Function Level of Independence: Independent with assistive device(s) Comments: occasionally has "freezing" spells when she has to limit mobility due to potential to fall.  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    Mobility  Overal bed mobility: Needs Assistance Bed Mobility: Supine to Sit, Sit to Supine, Rolling Rolling: Min assist (with rail) Supine to sit: Mod assist Sit to supine: Mod assist General bed mobility comments: assist for lifting trunk and scooting to edge of bed to sit; assist for repositioning trunk to left in supine and cues w/bed in trendelenberg to scoot to [facility]    Transfers  Overall transfer level: Needs assistance Equipment used: Rolling walker (2 wheeled) Transfers: Sit to/from Stand Sit to Stand: Min assist General transfer comment: initially attempts w/o assist pt lands back on bed, then assist for anterior weight shift     Ambulation / Gait / Stairs / Wheelchair Mobility  Ambulation/Gait Ambulation/Gait assistance: Museum/gallery curator (Feet): 130 Feet Assistive device: Rolling walker (2 wheeled) Gait Pattern/deviations: Step-through pattern, Decreased stride length, Shuffle General Gait Details: right trunk lean with head/neck dystonia and LOB x 2-3 to right min/mod assist to recover, improved while walking.    Posture / Balance Balance Overall balance assessment: Needs assistance Standing balance support: Bilateral upper extremity supported Standing balance-Leahy Scale: Poor Standing balance comment: needs UE support for balance    Special needs/care consideration  BiPAP/CPAP No CPM No Continuous Drip IV No Dialysis No  Life Vest No Oxygen No Special Bed No Trach Size No Wound Vac (area) No  Skin B LE cellulitis  Bowel mgmt: Last BM 12/07/14 with incontinence 12/06/14 Bladder mgmt: Voiding  on bedpan with some incontinence Diabetic mgmt No    Previous Home Environment Living Arrangements: Alone Available Help at Discharge: Available PRN/intermittently, Family Type of Home: House Home Layout: One level Home Access: Ramped entrance Additional Comments: pt lives alone, brothers get her groceries for her, she has Meals on Wheels for lunch and one of her brothers brings her supper.  Discharge Living Setting Plans for Discharge Living Setting: Alone, House (Lives alone.) Type of Home at Discharge: House Discharge Home Layout: One level Discharge Home Access: Ramped entrance  [place] Patient Roles: Other (Comment) (Has a brother.) Contact Information: [name] ([name]) [name] - brother Anticipated Caregiver: self and brother Anticipated Caregiver's Contact Information: [name] [phone] Ability/Limitations of Caregiver: Brother comes by every evening and brings dinner. He stays 5 to 15 minutes usually. Caregiver Availability: Intermittent Discharge Plan Discussed with Primary Caregiver: Yes Is Caregiver In Agreement with Plan?: Yes Does Caregiver/Family have Issues with Lodging/Transportation while Pt is in Rehab?: No  Goals/Additional Needs Patient/Family Goal for Rehab: PT mod I, OT Mod I to supervision goals Expected length of stay: 11-14 days Cultural Considerations: Attends [employer] Dietary Needs: Heart, thin liquids Equipment Needs: TBD Pt/Family Agrees to Admission and willing to participate: Yes Program Orientation Provided & Reviewed with Pt/Caregiver Including Roles & Responsibilities: Yes  Decrease burden of Care through IP  rehab admission: N/A  Possible need for [name] placement upon discharge: Yes, if she cannot progress to discharge home alone.  Patient Condition: This patient's condition remains as documented in the consult dated 12/07/14, in which the Rehabilitation Physician determined and documented that the patient's condition is appropriate for intensive rehabilitative care in an inpatient rehabilitation facility. Will admit to inpatient rehab today.  Preadmission Screen Completed By: Retta Diones, 12/08/2014 1:03 PM ______________________________________________________________________  Discussed status with Dr. Naaman Plummer on 12/08/14 at 85 and received telephone approval for admission today.  Admission Coordinator: Retta Diones, time1302/Date03/17/16          Cosigned by: Charlett Blake, MD at 12/08/2014 1:15 PM  Revision History     Date/Time User Provider Type Action   12/08/2014 1:15 PM Charlett Blake, MD Physician Cosign   12/08/2014 1:12 PM Retta Diones, RN Rehab Admission Coordinator Sign   12/08/2014 1:03 PM Retta Diones, RN Rehab Admission Coordinator Sign   View Details Report

## 2014-12-09 ENCOUNTER — Inpatient Hospital Stay (HOSPITAL_COMMUNITY): Payer: Medicare Other | Admitting: *Deleted

## 2014-12-09 ENCOUNTER — Inpatient Hospital Stay (HOSPITAL_COMMUNITY): Payer: Medicare Other

## 2014-12-09 DIAGNOSIS — G20 Parkinson's disease: Principal | ICD-10-CM

## 2014-12-09 DIAGNOSIS — L03119 Cellulitis of unspecified part of limb: Secondary | ICD-10-CM

## 2014-12-09 LAB — COMPREHENSIVE METABOLIC PANEL
ALBUMIN: 3 g/dL — AB (ref 3.5–5.2)
ALK PHOS: 67 U/L (ref 39–117)
ALT: 6 U/L (ref 0–35)
AST: 31 U/L (ref 0–37)
Anion gap: 6 (ref 5–15)
BILIRUBIN TOTAL: 0.6 mg/dL (ref 0.3–1.2)
BUN: 14 mg/dL (ref 6–23)
CHLORIDE: 104 mmol/L (ref 96–112)
CO2: 28 mmol/L (ref 19–32)
Calcium: 8.8 mg/dL (ref 8.4–10.5)
Creatinine, Ser: 0.77 mg/dL (ref 0.50–1.10)
GFR calc Af Amer: >90 mL/min (ref >90)
GFR calc non Af Amer: 81 mL/min — ABNORMAL LOW (ref >90)
Glucose, Bld: 112 mg/dL — ABNORMAL HIGH (ref 70–99)
POTASSIUM: 3.9 mmol/L (ref 3.5–5.1)
SODIUM: 138 mmol/L (ref 135–145)
Total Protein: 5.7 g/dL — ABNORMAL LOW (ref 6.0–8.3)

## 2014-12-09 LAB — CBC WITH DIFFERENTIAL/PLATELET
BASOS ABS: 0.1 10*3/uL (ref 0.0–0.1)
Basophils Relative: 1 % (ref 0–1)
Eosinophils Absolute: 0.4 10*3/uL (ref 0.0–0.7)
Eosinophils Relative: 5 % (ref 0–5)
HCT: 37.8 % (ref 36.0–46.0)
Hemoglobin: 12.1 g/dL (ref 12.0–15.0)
Lymphocytes Relative: 13 % (ref 12–46)
Lymphs Abs: 1 10*3/uL (ref 0.7–4.0)
MCH: 25.9 pg — ABNORMAL LOW (ref 26.0–34.0)
MCHC: 32 g/dL (ref 30.0–36.0)
MCV: 80.8 fL (ref 78.0–100.0)
MONO ABS: 0.6 10*3/uL (ref 0.1–1.0)
MONOS PCT: 7 % (ref 3–12)
NEUTROS ABS: 6.2 10*3/uL (ref 1.7–7.7)
Neutrophils Relative %: 74 % (ref 43–77)
Platelets: 145 10*3/uL — ABNORMAL LOW (ref 150–400)
RBC: 4.68 MIL/uL (ref 3.87–5.11)
RDW: 17.5 % — AB (ref 11.5–15.5)
WBC: 8.3 10*3/uL (ref 4.0–10.5)

## 2014-12-09 MED ORDER — GUAIFENESIN 100 MG/5ML PO SOLN
10.0000 mL | Freq: Four times a day (QID) | ORAL | Status: DC | PRN
  Administered 2014-12-09 – 2014-12-15 (×4): 200 mg via ORAL
  Filled 2014-12-09 (×6): qty 10

## 2014-12-09 NOTE — Evaluation (Signed)
Occupational Therapy Assessment and Plan  Patient Details  Name: [name] MRN: [id number] Date of Birth: November 21, 1940  OT Diagnosis: abnormal posture, muscle weakness (generalized), pain in joint and decreased coordination Rehab Potential: Rehab Potential (ACUTE ONLY): Good ELOS: 10-12 days   Today's Date: 12/09/2014 OT Individual Time: 4098-1191 OT Individual Time Calculation (min): 60 min     Problem List:  Patient Active Problem List   Diagnosis Date Noted  . Cellulitis 12/07/2014  . Lower extremity pain 12/07/2014  . Cellulitis of left lower extremity   . Nausea vomiting and diarrhea 08/07/2014  . Enteritis 08/07/2014  . 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 Chronic constipation 02/11/2014  . Primary freezing of gait 02/11/2014  . Abnormality of gait 01/28/2014  . Physical deconditioning 10/05/2013  . Thrombocytopenia, unspecified 09/30/2013  . Rectal bleeding 09/29/2013  . UGI bleed 09/29/2013  . Anemia due to blood loss, acute 09/29/2013  . Anemia, iron deficiency 04/09/2013  . Cameron lesion, acute 01/08/2012  . Acute urinary retention, resolved after foley and no re-occurrence, 12/11/11  12/24/2011  . Cellulitis of lower leg 12/24/2011  . Diastolic dysfunction, grade 2 12/15/2011  . CAP (community acquired pneumonia)  12/15/2011  . Abnormal EKG,(TWI lead 3 on one of 7 EKGs this admission) 12/12/2011  . Chest pain, MI R/O, negative MI- secondary to CAP 12/10/11 12/10/2011  . Cor pulmonale 12/10/2011  . Syncope and collapse, pt fell and did not know why she fell. 12/10/2011  . Hematemesis 09/26/2011  . Acute blood loss anemia 09/26/2011  . Hypotension 09/26/2011  . Leucocytosis 09/26/2011  . Aspiration of blood 09/26/2011  . Parkinson's disease 09/26/2011  . HTN (hypertension) 09/26/2011  . GI bleed: January 2013, Hgb stable, stool negative 09/26/2011  . Gastric ulcer with hemorrhage 09/26/2011  . Esophagitis, erosive  09/26/2011    Past Medical History:  Past Medical History  Diagnosis Date  . Parkinson's disease   . Hypertension   . Arthritis   . Anemia   . Cellulitis of lower leg 12/24/2011  . Acute bronchitis   . Other abnormal blood chemistry   . Chronic systolic heart failure   . Acute on chronic systolic heart failure   . Cellulitis and abscess of leg, except foot   . Hypotension, unspecified   . Helicobacter pylori (H. pylori)   . Acute gastric ulcer with hemorrhage, without mention of obstruction   . CHF (congestive heart failure)   . Anxiety    Past Surgical History:  Past Surgical History  Procedure Laterality Date  . Hip arthroscopy w/ labral repair    . Thyroidectomy    . Esophagogastroduodenoscopy  09/26/2011    Procedure: ESOPHAGOGASTRODUODENOSCOPY (EGD);  Surgeon: Zenovia Jarred, MD;  Location[facility] ENDOSCOPY;  Service: Gastroenterology;  Laterality: N/A;  To be done at bedside.  . Esophagogastroduodenoscopy N/A 09/30/2013    Procedure: ESOPHAGOGASTRODUODENOSCOPY (EGD);  Surgeon: [name], MD;  Location: [facility];  Service: Endoscopy;  Laterality: N/A;  . Esophagogastroduodenoscopy N/A 11/25/2013    Procedure: ESOPHAGOGASTRODUODENOSCOPY (EGD);  Surgeon: [name], MD;  Location: [facility] ENDOSCOPY;  Service: Endoscopy;  Laterality: N/A;    Assessment & Plan Clinical Impression: [name] is a 74 y.o. right handed female with history of diastolic congestive heart failure, chronic anemia, advanced Parkinson's disease. Patient well known to rehabilitation services from admission January 2015 for deconditioning related to Parkinson's disease with pseudo-exacerbation. Patient lives alone and Independent prior to admission with assistive device. Her brother does  her grocery shopping. She has Meals on Wheels for lunch. Presented 12/07/2014 with lower extremity pain left greater than right 2 weeks associated with mild erythema and warmth to touch. No fever or chills reported.  Preliminary venous Doppler studies negative for DVT. Suspect cellulitis placed on vancomycin and transitioned to Cipro 7 days and stop.. She remains on Mirapex as well as Sinemet as prior to admission for history of Parkinson's. Physical therapy evaluation completed 12/07/2014 with recommendations of physical medicine rehabilitation consult. Patient transferred to CIR on 12/08/2014 .    Patient currently requires min assist with basic self-care skills secondary to muscle weakness, decreased cardiorespiratoy endurance, decreased coordination, decreased safety awareness and decreased memory and decreased standing balance, decreased postural control and decreased balance strategies.  Prior to hospitalization, patient could complete BADLs with modified independent .  Patient will benefit from skilled intervention to increase independence with basic self-care skills prior to discharge home independently.  Anticipate patient will require intermittent supervision and follow up home health.  OT - End of Session Activity Tolerance: Decreased this session Endurance Deficit: Yes Endurance Deficit Description: frequent rest breaks OT Assessment Rehab Potential (ACUTE ONLY): Good OT Patient demonstrates impairments in the following area(s): Balance;Safety;Endurance;Edema;Motor;Cognition OT Basic ADL's Functional Problem(s): Eating;Grooming;Bathing;Dressing;Toileting OT Advanced ADL's Functional Problem(s): Laundry OT Transfers Functional Problem(s): Toilet;Tub/Shower OT Additional Impairment(s): Other (comment) (tremors and writhing due to advanced Parkinson's) OT Plan OT Intensity: Minimum of 1-2 x/day, 45 to 90 minutes OT Frequency: 5 out of 7 days OT Duration/Estimated Length of Stay: 10-12 days OT Treatment/Interventions: Balance/vestibular training;Cognitive remediation/compensation;Discharge planning;Community reintegration;DME/adaptive equipment instruction;Disease mangement/prevention;Functional  mobility training;Pain management;Neuromuscular re-education;Patient/family education;Psychosocial support;Self Care/advanced ADL retraining;Therapeutic Activities;Therapeutic Exercise;UE/LE Strength taining/ROM;UE/LE Coordination activities;Wheelchair propulsion/positioning;Splinting/orthotics OT Self Feeding Anticipated Outcome(s): Mod I OT Basic Self-Care Anticipated Outcome(s): Mod I OT Toileting Anticipated Outcome(s): Mod I OT Bathroom Transfers Anticipated Outcome(s): Mod I OT Recommendation Patient destination: Home Follow Up Recommendations: Home health OT Equipment Recommended: None recommended by OT Equipment Details: has TTB and elevated toilet seat   Skilled Therapeutic Intervention OT eval completed. Discussed role of OT, goals of therapy, rehab binder, safety plan, fall risk, and possible LOS. OT session focused on standing balance, functional transfers, safety awareness, and activity tolerance. Pt completed bathing at shower level with min A for functional transfers and standing balance. Pt ambulated bathroom>w/c approx 10' with min A and mod cues for safety with RW. Pt began oral care in standing, however required rest break after approx 1 min therefore completed remainder of task in sitting. Pt fatigued quickly throughout session, however required max cues for rest breaks. SpO2>93% throughout session. At end of session pt returned to bed and left with all needs in reach.   OT Evaluation Precautions/Restrictions  Precautions Precautions: Fall Precaution Comments: Parkinson's Restrictions Weight Bearing Restrictions: No General   Vital Signs  Pain Pain Assessment Pain Assessment: No/denies pain Pain Score: 0-No pain Home Living/Prior Functioning Home Living Family/patient expects to be discharged to:: Private residence Living Arrangements: Alone Available Help at Discharge: Available PRN/intermittently, Family Type of Home: House Home Access: Ramped entrance Home  Layout: One level Additional Comments: pt lives alone, brothers get her groceries for her, she has Meals on Wheels for lunch and one of her brothers brings her supper.  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, read, and listen to music Comments: occasionally has "freezing" spells when  she has to limit mobility due to potential to fall; reports only 1 fall in last 6 mos ADL   Vision/Perception  Vision- History Baseline Vision/History: Wears glasses Wears Glasses: Distance only Patient Visual Report: No change from baseline Vision- Assessment Vision Assessment?: Yes Eye Alignment: Within Functional Limits Ocular Range of Motion: Within Functional Limits Alignment/Gaze Preference: Within Defined Limits Tracking/Visual Pursuits: Requires cues, head turns, or add eye shifts to track Visual Fields: No apparent deficits  Cognition Overall Cognitive Status: No family/caregiver present to determine baseline cognitive functioning Arousal/Alertness: Awake/alert Orientation Level: Oriented X4 Attention: Sustained;Focused Focused Attention: Appears intact Sustained Attention: Appears intact Memory: Impaired Memory Impairment: Decreased recall of new information;Decreased short term memory Decreased Short Term Memory: Verbal basic;Functional basic Awareness: Impaired Awareness Impairment: Emergent impairment;Anticipatory impairment Problem Solving: Impaired Problem Solving Impairment: Verbal basic;Functional basic Behaviors: Restless;Impulsive Safety/Judgment: Impaired Sensation Sensation Light Touch: Appears Intact Hot/Cold: Appears Intact Proprioception: Appears Intact Coordination Gross Motor Movements are Fluid and Coordinated: No Fine Motor Movements are Fluid and Coordinated: No Coordination and  Movement Description: tremors, writhing movements due to parkinson's Finger Nose Finger Test: tremors, writhing movements due to parkinson's Heel Shin Test: dysmetric, undershooting Motor  Motor Motor: Motor impersistence Motor - Skilled Clinical Observations: tremors and writhing movements due to parkinsons Mobility  Bed Mobility Bed Mobility: Supine to Sit;Sit to Supine Supine to Sit: HOB flat;With rails;4: Min assist Supine to Sit Details: Manual facilitation for weight shifting;Tactile cues for initiation;Tactile cues for sequencing;Verbal cues for precautions/safety Sit to Supine: 5: Supervision Sit to Supine - Details: Verbal cues for precautions/safety Transfers Transfers: Sit to Stand;Stand to Sit Sit to Stand: 4: Min assist;From chair/3-in-1;From bed;With armrests;With upper extremity assist;From toilet Sit to Stand Details: Manual facilitation for weight shifting;Verbal cues for precautions/safety;Verbal cues for sequencing;Verbal cues for technique Stand to Sit: 4: Min assist;With armrests;With upper extremity assist;To bed;To chair/3-in-1;To toilet Stand to Sit Details (indicate cue type and reason): Verbal cues for sequencing;Manual facilitation for weight shifting;Verbal cues for precautions/safety;Verbal cues for technique  Trunk/Postural Assessment  Cervical Assessment Cervical Assessment: Exceptions to [facility] (forward head) Thoracic Assessment Thoracic Assessment: Exceptions to [facility] (rounded shoulders; kyphotic) Lumbar Assessment Lumbar Assessment: Within Functional Limits Postural Control Postural Control: Deficits on evaluation Righting Reactions: delayed Protective Responses: delayed Postural Limitations: posterior pelvic tilt in sitting  Balance Balance Balance Assessed: Yes Standardized Balance Assessment Standardized Balance Assessment: Timed Up and Go Test Timed Up and Go Test TUG: Normal TUG Normal TUG (seconds): 38.53 Static Sitting Balance Static  Sitting - Balance Support: Bilateral upper extremity supported;Right upper extremity supported;Left upper extremity supported;Feet supported Static Sitting - Level of Assistance: 5: Stand by assistance Dynamic Sitting Balance Dynamic Sitting - Balance Support: Right upper extremity supported;Left upper extremity supported;Bilateral upper extremity supported;Feet supported;During functional activity Dynamic Sitting - Level of Assistance: 5: Stand by assistance;4: Min assist Static Standing Balance Static Standing - Balance Support: Bilateral upper extremity supported;During functional activity Static Standing - Level of Assistance: 4: Min assist Dynamic Standing Balance Dynamic Standing - Balance Support: During functional activity (alternating UE and no UE support) Dynamic Standing - Level of Assistance: 4: Min assist Extremity/Trunk Assessment RUE Assessment RUE Assessment: Exceptions to [facility] RUE AROM (degrees) RUE Overall AROM Comments: tremors and writhing movements due to Parkinson's RUE Strength RUE Overall Strength Comments: 4/5 overall LUE Assessment LUE Assessment: Exceptions to WFL LUE AROM (degrees) LUE Overall AROM Comments: tremors and writhing movements due to Parkinson's LUE Strength LUE Overall Strength Comments: 4/5 overall  FIM:  FIM -  Grooming Grooming Steps: Wash, rinse, dry face;Wash, rinse, dry hands;Oral care, brush teeth, clean dentures;Brush, comb hair Grooming: 5: Set-up assist to obtain items FIM - Bathing Bathing Steps Patient Completed: Chest;Right Arm;Buttocks;Front perineal area;Left Arm;Right upper leg;Left upper leg;Abdomen Bathing: 4: Min-Patient completes 8-9 2f10 parts or 75+ percent FIM - Upper Body Dressing/Undressing Upper body dressing/undressing steps patient completed: Thread/unthread right bra strap;Thread/unthread left bra strap;Thread/unthread left sleeve of pullover shirt/dress;Put head through opening of pull over  shirt/dress;Thread/unthread right sleeve of pullover shirt/dresss Upper body dressing/undressing: 4: Min-Patient completed 75 plus % of tasks FIM - Lower Body Dressing/Undressing Lower body dressing/undressing steps patient completed: Thread/unthread right underwear leg;Thread/unthread left underwear leg;Pull underwear up/down;Thread/unthread left pants leg;Pull pants up/down Lower body dressing/undressing: 3: Mod-Patient completed 50-74% of tasks FIM - Toileting Toileting steps completed by patient: Adjust clothing prior to toileting;Performs perineal hygiene;Adjust clothing after toileting Toileting: 4: Steadying assist FIM - BControl and instrumentation engineerDevices: Walker;Bed rails;Arm rests Bed/Chair Transfer: 4: Supine > Sit: Min A (steadying Pt. > 75%/lift 1 leg);5: Sit > Supine: Supervision (verbal cues/safety issues);4: Bed > Chair or W/C: Min A (steadying Pt. > 75%);4: Chair or W/C > Bed: Min A (steadying Pt. > 75%) FIM - TRadio producerDevices: Grab bars Toilet Transfers: 4-To toilet/BSC: Min A (steadying Pt. > 75%);4-From toilet/BSC: Min A (steadying Pt. > 75%) FIM - Tub/Shower Transfers Tub/Shower Assistive Devices: Walk in shower;Shower chair;Grab bars Tub/shower Transfers: 4-Into Tub/Shower: Min A (steadying Pt. > 75%/lift 1 leg);4-Out of Tub/Shower: Min A (steadying Pt. > 75%/lift 1 leg)   Refer to Care Plan for Long Term Goals  Recommendations for other services: None  Discharge Criteria: Patient will be discharged from OT if patient refuses treatment 3 consecutive times without medical reason, if treatment goals not met, if there is a change in medical status, if patient makes no progress towards goals or if patient is discharged from hospital.  The above assessment, treatment plan, treatment alternatives and goals were discussed and mutually agreed upon: by patient  PDuayne Cal3/18/2016, 11:50 AM

## 2014-12-09 NOTE — IPOC Note (Signed)
Overall Plan of Care [facility]) Patient Details Name: [name] MRN: [id number] DOB: [birth year]  Admitting Diagnosis: Parkinsons  B LE cellulitis   Hospital Problems: Active Problems:   Parkinson's disease     Functional Problem List: Nursing Bladder, Bowel, Endurance, Motor, Pain, Safety  PT Balance, Endurance, Motor, Safety  OT Balance, Safety, Endurance, Edema, Motor, Cognition  SLP    TR         Basic ADL's: OT Eating, Grooming, Bathing, Dressing, Toileting     Advanced  ADL's: OT Laundry     Transfers: PT Bed Mobility, Bed to Chair, Car, [employer], Futures trader, Tub/Shower     Locomotion: PT Stairs, Emergency planning/management officer, Ambulation     Additional Impairments: OT Other (comment) (tremors and writhing due to advanced Parkinson's)  SLP        TR      Anticipated Outcomes Item Anticipated Outcome  Self Feeding Mod I  Swallowing      Basic self-care  Mod I  Toileting  Mod I   Bathroom Transfers Mod I  Bowel/Bladder  Continent to bowel and bladder.  Transfers  mod I  Locomotion  mod I household gait  Communication     Cognition     Pain  Less than 3,on scale 1 to 10  Safety/Judgment  Free from falls during her stay in rehab.   Therapy Plan: PT Intensity: Minimum of 1-2 x/day ,45 to 90 minutes PT Frequency: 5 out of 7 days PT Duration Estimated Length of Stay: 10-12 days OT Intensity: Minimum of 1-2 x/day, 45 to 90 minutes OT Frequency: 5 out of 7 days OT Duration/Estimated Length of Stay: 10-12 days         Team Interventions: Nursing Interventions Patient/Family Education, Bladder Management, Pain Management  PT interventions Ambulation/gait training, Disease management/prevention, Pain management, Stair training, Wheelchair propulsion/positioning, Therapeutic Activities, Patient/family education, Engineer, drilling, Training and development officer, Cognitive remediation/compensation, Psychosocial support, Therapeutic  Exercise, UE/LE Strength taining/ROM, Skin care/wound management, Functional mobility training, Community reintegration, Discharge planning, Neuromuscular re-education, Splinting/orthotics, UE/LE Coordination activities  OT Interventions Training and development officer, Cognitive remediation/compensation, Discharge planning, Community reintegration, Engineer, drilling, Disease mangement/prevention, Functional mobility training, Pain management, Neuromuscular re-education, Patient/family education, Psychosocial support, Self Care/advanced ADL retraining, Therapeutic Activities, Therapeutic Exercise, UE/LE Strength taining/ROM, UE/LE Coordination activities, Wheelchair propulsion/positioning, Splinting/orthotics  SLP Interventions    TR Interventions    SW/CM Interventions Discharge Planning, Psychosocial Support, Patient/Family Education    Team Discharge Planning: Destination: PT-Home ,OT- Home , SLP-  Projected Follow-up: PT-Home health PT, OT-  Home health OT, SLP-  Projected Equipment Needs: PT-To be determined, Rolling walker with 5" wheels, OT- None recommended by OT, SLP-  Equipment Details: PT-Patient owns RTS, BSC, tub bench, w/c; patient owns rollator, so may require RW for safety with ambulation; recommendations TBD upon discharge, OT-has TTB and elevated toilet seat Patient/family involved in discharge planning: PT- Patient,  OT-Patient, SLP-   MD ELOS: 10-12 days Medical Rehab Prognosis:  Excellent Assessment: The patient has been admitted for CIR therapies with the diagnosis of pseudo exacerbation of parkinsons disease. The team will be addressing functional mobility, strength, stamina, balance, safety, adaptive techniques and equipment, self-care, bowel and bladder mgt, patient and caregiver education, NMR, balance, coordination, pain control, visual-spatial awareness. Goals have been set at mod I for basic mobility and self-care/ADL's.    [name], MD,  FAAPMR      See Team Conference Notes for weekly updates to the plan of care

## 2014-12-09 NOTE — Progress Notes (Signed)
Social Work  Social Work Assessment and Plan  Patient Details  Name: [name] MRN: [id number] Date of Birth: 10-04-1940  Today's Date: 12/09/2014  Problem List:  Patient Active Problem List   Diagnosis Date Noted  . Cellulitis 12/07/2014  . Lower extremity pain 12/07/2014  . Cellulitis of left lower extremity   . Nausea vomiting and diarrhea 08/07/2014  . Enteritis 08/07/2014  . 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 Chronic constipation 02/11/2014  . Primary freezing of gait 02/11/2014  . Abnormality of gait 01/28/2014  . Physical deconditioning 10/05/2013  . Thrombocytopenia, unspecified 09/30/2013  . Rectal bleeding 09/29/2013  . UGI bleed 09/29/2013  . Anemia due to blood loss, acute 09/29/2013  . Anemia, iron deficiency 04/09/2013  . Cameron lesion, acute 01/08/2012  . Acute urinary retention, resolved after foley and no re-occurrence, 12/11/11  12/24/2011  . Cellulitis of lower leg 12/24/2011  . Diastolic dysfunction, grade 2 12/15/2011  . CAP (community acquired pneumonia)  12/15/2011  . Abnormal EKG,(TWI lead 3 on one of 7 EKGs this admission) 12/12/2011  . Chest pain, MI R/O, negative MI- secondary to CAP 12/10/11 12/10/2011  . Cor pulmonale 12/10/2011  . Syncope and collapse, pt fell and did not know why she fell. 12/10/2011  . Hematemesis 09/26/2011  . Acute blood loss anemia 09/26/2011  . Hypotension 09/26/2011  . Leucocytosis 09/26/2011  . Aspiration of blood 09/26/2011  . Parkinson's disease 09/26/2011  . HTN (hypertension) 09/26/2011  . GI bleed: January 2013, Hgb stable, stool negative 09/26/2011  . Gastric ulcer with hemorrhage 09/26/2011  . Esophagitis, erosive 09/26/2011   Past Medical History:  Past Medical History  Diagnosis Date  . Parkinson's disease   . Hypertension   . Arthritis   . Anemia   . Cellulitis of lower leg 12/24/2011  . Acute bronchitis   . Other abnormal blood chemistry   . Chronic systolic  heart failure   . Acute on chronic systolic heart failure   . Cellulitis and abscess of leg, except foot   . Hypotension, unspecified   . Helicobacter pylori (H. pylori)   . Acute gastric ulcer with hemorrhage, without mention of obstruction   . CHF (congestive heart failure)   . Anxiety    Past Surgical History:  Past Surgical History  Procedure Laterality Date  . Hip arthroscopy w/ labral repair    . Thyroidectomy    . Esophagogastroduodenoscopy  09/26/2011    Procedure: ESOPHAGOGASTRODUODENOSCOPY (EGD);  Surgeon: Zenovia Jarred, MD;  [facility] ENDOSCOPY;  Service: Gastroenterology;  Laterality: N/A;  To be done at bedside.  . Esophagogastroduodenoscopy N/A 09/30/2013    Procedure: ESOPHAGOGASTRODUODENOSCOPY (EGD);  Surgeon: [name], MD;  Location: [place];  Service: Endoscopy;  Laterality: N/A;  . Esophagogastroduodenoscopy N/A 11/25/2013    Procedure: ESOPHAGOGASTRODUODENOSCOPY (EGD);  Surgeon: [name], MD;  Location: [facility] ENDOSCOPY;  Service: Endoscopy;  Laterality: N/A;   Social History:  reports that she has never smoked. She has never used smokeless tobacco. She reports that she does not drink alcohol or use illicit drugs.  Family / Support Systems Marital Status: Single Patient Roles: Other (Comment) (sibling) Other Supports: brother, "[name]" Mcmeans @ (C) [phone];  brother, Juri Dinning @ [phone] and a neice who has assisted her with housekeeping (but has been limited in her ability to do this since the past hospital Anticipated Caregiver: self and brother Ability/Limitations of Caregiver: Brother comes by every evening and brings dinner.  He stays  5 to 15 minutes usually. Caregiver Availability: Intermittent Family Dynamics: Pt describes her brothers and extended family as very supportive.  Family checking in on her daily.  Social History Preferred language: English Religion: Non-Denominational Cultural Background: NA Read: Yes Write: Yes Employment  Status: Retired Freight forwarder Issues: none Guardian/Conservator: none - per MD, pt capable of making decision on her own behalf   Abuse/Neglect Physical Abuse: Denies Verbal Abuse: Denies Sexual Abuse: Denies Exploitation of patient/patient's resources: Denies Self-Neglect: Denies  Emotional Status Pt's affect, behavior adn adjustment status: Pt pleasant, oriented but admits some frustration with her return to the hospital.  She is familiar with CIR from stay here Jan 2015.  Denies any s/s of emotional distress but will monitor throughout stay. Recent Psychosocial Issues: None Pyschiatric History: None Substance Abuse History: None  Patient / Family Perceptions, Expectations & Goals Pt/Family understanding of illness & functional limitations: Pt and family with good understanding about her LE cellulitis and the exacerbation of her Parkinson's symptoms/ need for CIR return. Premorbid pt/family roles/activities: Pt was independent overall, however, neice does assist with general housekeeping and she has mobile meal and brother to assist with meals. Anticipated changes in roles/activities/participation: little change anticipated as mod i goals have been set. Pt/family expectations/goals: "I just need to get my strength built back up."  [employer]: Other (Comment) (Mobile Meals; [employer]) Premorbid Home Care/DME Agencies: Other (Comment) ([facility] in past) Transportation available at discharge: yes, family, [facility] or senior wheels  Discharge Planning Living Arrangements: Alone Support Systems: Other relatives Type of Residence: Private residence Insurance Resources: [employer], Multimedia programmer (specify) Nurse, mental health) Financial Resources: [place] Referred: No Living Expenses: Own Money Management: Patient Does the patient have any problems obtaining your medications?: No Home Management: pt, neice, brothers Patient/Family Preliminary  Plans: pt intends to return to her home with intermittent assist of family and community support agencies. Social Work Anticipated Follow Up Needs: HH/OP Expected length of stay: 10-12 days  Clinical Impression Very independent woman who has been on CIR prior in jan 2015.  Parkinsons dz but was still maintaining her independence at her home with intermittent support of her family and community support services.  Very motivated to regain her prior level of independence and to return home alone.  Will follow for support and d/c planning needs.  Sakiyah Shur 12/09/2014, 2:51 PM

## 2014-12-09 NOTE — Progress Notes (Signed)
Occupational Therapy Session Note  Patient Details  Name: [name] MRN: [id number] Date of Birth: 22-Mar-1941  Today's Date: 12/09/2014 OT Individual Time:1400-1500  OT Individual Time Calculation (min): 60 min    Short Term Goals: Week 1:  OT Short Term Goal 1 (Week 1): Pt will complete toilet transfer at supervision level OT Short Term Goal 2 (Week 1): Pt will initiate 2 rest breaks during ADL task with mod cues OT Short Term Goal 3 (Week 1): Pt will stand for 2 min during functional activity at supervision level for balance OT Short Term Goal 4 (Week 1): Pt will complete LB dressing at supervision level  Skilled Therapeutic Interventions/Progress Updates:  Balance/vestibular training;t;Self Care/advanced ADL retraining;Therapeutic Activities;Therapeutic Exercise;UE/LE Strength taining/ROM;UE/LE Coordination activities;Wheelchair propulsion/positioning;.  Instructed pt on wc mobility.  Pt. Propelled wc in straight line for 40 feet before needing rest break.  Instructed pt on reaching and passing items in kitchen from one counter to another.  Educated pt on wc positioning in kitchen for opening refrigerator and reaching inside.  Demonstrated fair UE strength    Needed cues for positioning wc to get in refrigerator. Ppt. Rode Sci fit for 5 minutes on 1 work load with cues for upright posture  Positoned pt beside bed and transferred to bed with max assist lifting both legs on bed.   Therapy Documentation Precautions:  Precautions Precautions: Fall Precaution Comments: Parkinson's Restrictions Weight Bearing Restrictions: No      Pain: Pain Assessment Pain Assessment: No/denies pain Pain Score: 0-No pain          See FIM for current functional status  Therapy/Group: Individual Therapy  Lisa Roca 12/09/2014, 2:01 PM

## 2014-12-09 NOTE — Progress Notes (Signed)
[facility]ICINE [facility] REH[facility]ILITATION     PROGRESS NOTE    Subjective/Complaints: Had a fair night. Able to sleep. Left leg pain better  Objective: Vital Signs: Blood pressure 164/74, pulse 68, temperature 97.6 F (36.4 C), temperature source Oral, resp. rate 18, SpO2 97 %. US Renal  12/09/2014   CLINICAL DATA:  Renal mass  EXAM: RENAL/URINARY TRACT ULTRASOUND COMPLETE  COMPARISON:  CT 08/07/2014  FINDINGS: Right Kidney:  Length: 11.1 cm. Echogenicity within normal limits. No mass or hydronephrosis visualized.  Left Kidney:  Length: 10.6 cm. There is a benign lower pole cyst measuring 1.1 x 1.2 x 1.8 cm. This corresponds to the abnormality observed on CT.  Bladder:  Decompressed  IMPRESSION: Benign lower pole left renal cyst, accounting for the abnormality observed on recent CT.   Electronically Signed   By: Andreas Newport M.D.   On: 12/09/2014 03:14    Recent Labs  12/07/14 0750 12/08/14 0514  WBC 9.6 7.1  HGB 11.7* 11.1*  HCT 36.7 35.6*  PLT 149* 133*    Recent Labs  12/07/14 0750 12/08/14 0514  NA 139 139  K 3.5 4.1  CL 107 107  GLUCOSE 107* 131*  BUN 12 14  CREATININE 0.71 0.81  CALCIUM 8.4 8.5   CBG (last 3)  No results for input(s): GLUCAP in the last 72 hours.  Wt Readings from Last 3 Encounters:  08/25/14 79.289 kg (174 lb 12.8 oz)  07/21/14 80.74 kg (178 lb)  04/18/14 79.833 kg (176 lb)    Physical Exam:  HENT:  Head: Normocephalic.  Eyes: EOM are normal.  Neck: Normal range of motion. Neck supple. No thyromegaly present.  Cardiovascular: Normal rate and regular rhythm.  Respiratory: Effort normal and breath sounds normal. No respiratory distress.  GI: Soft. Bowel sounds are normal. She exhibits no distension.  Neurological: She is alert.  Makes good eye contact with examiner. Appropriate to conversation. Speech is a bit dysarthric but intelligible. She easily becomes anxious during exam. Follows simple commands. She can provide her  name and age as well as date of birth. Continued pill rolling tremor bilateral UE. Persistent essential tremor in both legs. Speech halting but intelligible. Reasonable insight and awareness. Remembered me from her last stay  Skin:  Erythema over anterior shin lower extremities improving. Palpable pedal pulses. Left leg less tender. No obvious skin breakdown   Assessment/Plan: 1. Functional deficits secondary to pseudo-exacerbation of PD which require 3+ hours per day of interdisciplinary therapy in a comprehensive inpatient rehab setting. Physiatrist is providing close team supervision and 24 hour management of active medical problems listed below. Physiatrist and rehab team continue to assess barriers to discharge/monitor patient progress toward functional and medical goals. FIM:                                  Medical Problem List and Plan: 1. Functional deficits secondary to pseudo-exacerbation of Parkinson's disease related to bilateral lower extremity cellulitis. Cipro as directed for cellulitis 7 days and stop 2. DVT Prophylaxis/Anticoagulation: SCDs. Monitor for any signs of DVT. Check vascular study today 3. Pain Management: Oxycodone as needed. Monitor with increased mobility 4. Mood/anxiety. Ativan 0.5 mg as needed. 5. Neuropsych: This patient is capable of making decisions on her own behalf. 6. Skin/Wound Care: Routine skin checks 7. Fluids/Electrolytes/Nutrition: Strict I and O's with follow-up chemistries 8. Parkinson's disease. Continue Mirapex 1.5 mg 3 times a day  as well as Sinemet 4 times a day  -symptoms fairly pronounced but at high end dosing schedule LOS (Days) 1 A FACE TO FACE EVALUATION WAS PERFORMED  Tarez Bowns T 12/09/2014 7:57 AM

## 2014-12-09 NOTE — Progress Notes (Signed)
Patient information reviewed and entered into eRehab system by Bralynn Donado, RN, CRRN, PPS Coordinator.  Information including medical coding and functional independence measure will be reviewed and updated through discharge.     Per nursing patient was given "Data Collection Information Summary for Patients in Inpatient Rehabilitation Facilities with attached "Privacy Act Statement-Health Care Records" upon admission.  

## 2014-12-09 NOTE — Care Management Note (Signed)
Inpatient [facility] Individual Statement of Services  Patient Name:  [name]  Date:  12/09/2014  Welcome to the [facility].  Our goal is to provide you with an individualized program based on your diagnosis and situation, designed to meet your specific needs.  With this comprehensive rehabilitation program, you will be expected to participate in at least 3 hours of rehabilitation therapies Monday-Friday, with modified therapy programming on the weekends.  Your rehabilitation program will include the following services:  Physical Therapy (PT), Occupational Therapy (OT), 24 hour per day rehabilitation nursing, Therapeutic Recreaction (TR), Neuropsychology, Case Management (Social Worker), Rehabilitation Medicine, Nutrition Services and Pharmacy Services  Weekly team conferences will be held on Tuesdays to discuss your progress.  Your Social Worker will talk with you frequently to get your input and to update you on team discussions.  Team conferences with you and your family in attendance may also be held.  Expected length of stay: 10-12 days  Overall anticipated outcome: modified independent  Depending on your progress and recovery, your program may change. Your Social Worker will coordinate services and will keep you informed of any changes. Your Social Worker's name and contact numbers are listed  below.  The following services may also be recommended but are not provided by the [facility] will be made to provide these services after discharge if needed.  Arrangements include referral to agencies that provide these services.  Your insurance has been verified to be:  Medicare and [place] Your primary doctor is:  Hollace Kinnier  Pertinent information will be shared with your doctor and your  insurance company.  Social Worker:  [place], [place] or ([phone]   Information discussed with and copy given to patient by: Lennart Pall, 12/09/2014, 1:46 PM

## 2014-12-09 NOTE — Evaluation (Signed)
Physical Therapy Assessment and Plan  Patient Details  Name: [name] MRN: [id number] Date of Birth: 1941-09-19  PT Diagnosis: Abnormal posture, Abnormality of gait, Coordination disorder and Muscle weakness Rehab Potential: Fair ELOS: 10-12 days   Today's Date: 12/09/2014 PT Individual Time: 1000-1100 PT Individual Time Calculation (min): 60 min    Problem List:  Patient Active Problem List   Diagnosis Date Noted  . Cellulitis 12/07/2014  . Lower extremity pain 12/07/2014  . Cellulitis of left lower extremity   . Nausea vomiting and diarrhea 08/07/2014  . Enteritis 08/07/2014  . 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 Chronic constipation 02/11/2014  . Primary freezing of gait 02/11/2014  . Abnormality of gait 01/28/2014  . Physical deconditioning 10/05/2013  . Thrombocytopenia, unspecified 09/30/2013  . Rectal bleeding 09/29/2013  . UGI bleed 09/29/2013  . Anemia due to blood loss, acute 09/29/2013  . Anemia, iron deficiency 04/09/2013  . Cameron lesion, acute 01/08/2012  . Acute urinary retention, resolved after foley and no re-occurrence, 12/11/11  12/24/2011  . Cellulitis of lower leg 12/24/2011  . Diastolic dysfunction, grade 2 12/15/2011  . CAP (community acquired pneumonia)  12/15/2011  . Abnormal EKG,(TWI lead 3 on one of 7 EKGs this admission) 12/12/2011  . Chest pain, MI R/O, negative MI- secondary to CAP 12/10/11 12/10/2011  . Cor pulmonale 12/10/2011  . Syncope and collapse, pt fell and did not know why she fell. 12/10/2011  . Hematemesis 09/26/2011  . Acute blood loss anemia 09/26/2011  . Hypotension 09/26/2011  . Leucocytosis 09/26/2011  . Aspiration of blood 09/26/2011  . Parkinson's disease 09/26/2011  . HTN (hypertension) 09/26/2011  . GI bleed: January 2013, Hgb stable, stool negative 09/26/2011  . Gastric ulcer with hemorrhage 09/26/2011  . Esophagitis, erosive 09/26/2011    Past Medical History:  Past Medical  History  Diagnosis Date  . Parkinson's disease   . Hypertension   . Arthritis   . Anemia   . Cellulitis of lower leg 12/24/2011  . Acute bronchitis   . Other abnormal blood chemistry   . Chronic systolic heart failure   . Acute on chronic systolic heart failure   . Cellulitis and abscess of leg, except foot   . Hypotension, unspecified   . Helicobacter pylori (H. pylori)   . Acute gastric ulcer with hemorrhage, without mention of obstruction   . CHF (congestive heart failure)   . Anxiety    Past Surgical History:  Past Surgical History  Procedure Laterality Date  . Hip arthroscopy w/ labral repair    . Thyroidectomy    . Esophagogastroduodenoscopy  09/26/2011    Procedure: ESOPHAGOGASTRODUODENOSCOPY (EGD);  Surgeon: Zenovia Jarred, MD;  Location: [facility];  Service: Gastroenterology;  Laterality: N/A;  To be done at bedside.  . Esophagogastroduodenoscopy N/A 09/30/2013    Procedure: ESOPHAGOGASTRODUODENOSCOPY (EGD);  Surgeon: [name], MD;  Location: [place];  Service: Endoscopy;  Laterality: N/A;  . Esophagogastroduodenoscopy N/A 11/25/2013    Procedure: ESOPHAGOGASTRODUODENOSCOPY (EGD);  Surgeon: [name], MD;  Location: [facility] ENDOSCOPY;  Service: Endoscopy;  Laterality: N/A;    Assessment & Plan Clinical Impression: [name] is a 74 y.o. right handed female with history of diastolic congestive heart failure, chronic anemia, advanced Parkinson's disease. Patient well known to rehabilitation services from admission January 2015 for deconditioning related to Parkinson's disease with pseudo-exacerbation. Patient lives alone and Independent prior to admission with assistive device. Her brother does her grocery shopping. She has Meals  on Wheels for lunch. Presented 12/07/2014 with lower extremity pain left greater than right 2 weeks associated with mild erythema and warmth to touch. No fever or chills reported. Preliminary venous Doppler studies negative for DVT. Suspect  cellulitis placed on vancomycin and transitioned to Cipro 7 days and stop.. She remains on Mirapex as well as Sinemet as prior to admission for history of Parkinson's. Physical therapy evaluation completed 12/07/2014 with recommendations of physical medicine rehabilitation consult. Patient was admitted for comprehensive rehabilitation program Patient transferred to CIR on 12/08/2014 .   Patient currently requires min with mobility secondary to muscle weakness, decreased cardiorespiratoy endurance, impaired timing and sequencing, decreased coordination and decreased motor planning, decreased attention, decreased awareness, decreased problem solving, decreased safety awareness and decreased memory and decreased standing balance, decreased postural control and decreased balance strategies.  Prior to hospitalization, patient was modified independent  with mobility and lived with Alone in a House home.  Home access is  Ramped entrance.  Patient will benefit from skilled PT intervention to maximize safe functional mobility, minimize fall risk and decrease caregiver burden for planned discharge home with intermittent assist.  Anticipate patient will benefit from follow up [facility] at discharge.  PT - End of Session Activity Tolerance: Tolerates 30+ min activity with multiple rests Endurance Deficit: Yes Endurance Deficit Description: frequent rest breaks PT Assessment Rehab Potential (ACUTE/IP ONLY): Fair Barriers to Discharge: Decreased caregiver support PT Patient demonstrates impairments in the following area(s): Balance;Endurance;Motor;Safety PT Transfers Functional Problem(s): Bed Mobility;Bed to Chair;Car;Furniture;Floor PT Locomotion Functional Problem(s): Stairs;Wheelchair Mobility;Ambulation PT Plan PT Intensity: Minimum of 1-2 x/day ,45 to 90 minutes PT Frequency: 5 out of 7 days PT Duration Estimated Length of Stay: 10-12 days PT Treatment/Interventions: Ambulation/gait training;Disease  management/prevention;Pain management;Stair training;Wheelchair propulsion/positioning;Therapeutic Activities;Patient/family education;DME/adaptive equipment instruction;Balance/vestibular training;Cognitive remediation/compensation;Psychosocial support;Therapeutic Exercise;UE/LE Strength taining/ROM;Skin care/wound management;Functional mobility training;Community reintegration;Discharge planning;Neuromuscular re-education;Splinting/orthotics;UE/LE Coordination activities PT Transfers Anticipated Outcome(s): mod I PT Locomotion Anticipated Outcome(s): mod I household gait PT Recommendation Follow Up Recommendations: Home health PT Patient destination: Home Equipment Recommended: To be determined;Rolling walker with 5" wheels Equipment Details: Patient owns RTS, BSC, tub bench, w/c; patient owns rollator, so may require RW for safety with ambulation; recommendations TBD upon discharge  Skilled Therapeutic Intervention Skilled therapeutic intervention initiated after completion of evaluation. Discussed falls risk, safety within room, and focus of therapy during stay. Discussed possible LOS, goals, and f/u therapy.  Five times Sit to Stand Test (FTSS) Method: Use a straight back chair with a solid seat that is 16-18" high. Ask participant to sit on the chair with arms folded across their chest.   Instructions: "Stand up and sit down as quickly as possible 5 times, keeping your arms folded across your chest."   Measurement: Stop timing when the participant stands the 5th time.  TIME: _21.94_____ (in seconds)  Times > 13.6 seconds is associated with increased disability and morbidity (Guralnik, 2000) Times > 15 seconds is predictive of recurrent falls in healthy individuals aged 80 and older (Buatois, et al., 2008) Normal performance values in community dwelling individuals aged 86 and older (Bohannon, 2006): o 60-69 years: 11.4 seconds o 70-79 years: 12.6 seconds o 80-89 years: 14.8  seconds  MCID: ? 2.3 seconds for Vestibular Disorders Mariah Milling, 2006)   PT Evaluation Precautions/Restrictions Precautions Precautions: Fall Precaution Comments: Parkinson's Restrictions Weight Bearing Restrictions: No General Chart Reviewed: Yes Family/Caregiver Present: No  Pain Pain Assessment Pain Assessment: No/denies pain Pain Score: 0-No pain Home Living/Prior Functioning Home Living Available Help at Discharge: Available  PRN/intermittently;Family Type of Home: House Home Access: Ramped entrance Home Layout: One level Additional Comments: pt lives alone, brothers get her groceries for her, she has Meals on Wheels for lunch and one of her brothers brings her supper.  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 Cognition Overall Cognitive Status: No family/caregiver present to determine baseline cognitive functioning Arousal/Alertness: Awake/alert Orientation Level: Oriented X4 Attention: Sustained;Focused Focused Attention: Appears intact Sustained Attention: Appears intact Memory: Impaired Memory Impairment: Decreased recall of new information;Decreased short term memory Decreased Short Term Memory: Verbal basic;Functional basic Awareness: Impaired Awareness Impairment: Emergent impairment;Anticipatory impairment Problem  Solving: Impaired Problem Solving Impairment: Verbal basic;Functional basic Behaviors: Restless;Impulsive Safety/Judgment: Impaired Sensation Sensation Light Touch: Appears Intact Proprioception: Appears Intact Coordination Gross Motor Movements are Fluid and Coordinated: No Fine Motor Movements are Fluid and Coordinated: No Coordination and Movement Description: tremors, writhing movements due to parkinson's Heel Shin Test: dysmetric, undershooting Motor  Motor Motor: Motor impersistence Motor - Skilled Clinical Observations: tremors and writhing movements due to parkinsons  Mobility Bed Mobility Bed Mobility: Supine to Sit;Sit to Supine Supine to Sit: HOB flat;With rails;4: Min assist Supine to Sit Details: Manual facilitation for weight shifting;Tactile cues for initiation;Tactile cues for sequencing;Verbal cues for precautions/safety Transfers Transfers: Yes Sit to Stand: 4: Min assist;From chair/3-in-1;From bed;With armrests;With upper extremity assist Sit to Stand Details: Manual facilitation for weight shifting;Verbal cues for precautions/safety;Verbal cues for sequencing;Verbal cues for technique Stand to Sit: 4: Min assist;With armrests;With upper extremity assist;To bed;To chair/3-in-1 Stand to Sit Details (indicate cue type and reason): Verbal cues for sequencing;Manual facilitation for weight shifting;Verbal cues for precautions/safety;Verbal cues for technique Stand Pivot Transfers: 4: Min assist;With armrests Stand Pivot Transfer Details: Verbal cues for sequencing;Manual facilitation for weight shifting;Verbal cues for precautions/safety;Verbal cues for technique Locomotion  Ambulation Ambulation: Yes Ambulation/Gait Assistance: 4: Min assist Ambulation Distance (Feet): 90 Feet Assistive device: Rolling walker Ambulation/Gait Assistance Details: Verbal cues for gait pattern;Tactile cues for posture;Tactile cues for weight shifting;Verbal cues for  precautions/safety;Verbal cues for safe use of DME/AE;Visual cues for safe use of DME/AE Ambulation/Gait Assistance Details: Patient performed gait training 90' x1 in controlled environment with RW and minA. Gait Gait: Yes Gait Pattern: Impaired Gait Pattern: Step-through pattern;Decreased stride length;Narrow base of support;Shuffle;Scissoring;Decreased hip/knee flexion - left;Decreased hip/knee flexion - right;Poor foot clearance - right;Poor foot clearance - left;Decreased weight shift to right;Decreased weight shift to left Stairs / Additional Locomotion Stairs: Yes Stairs Assistance: 4: Min assist Stairs Assistance Details: Verbal cues for precautions/safety;Tactile cues for weight shifting Stairs Assistance Details (indicate cue type and reason): Verbal cues for appropriate pacing as patient somewhat impulsive Stair Management Technique: Two rails;Alternating pattern;Forwards Number of Stairs: 4 Height of Stairs: 6 Wheelchair Mobility Wheelchair Mobility: Yes Wheelchair Assistance: 4: Advertising account executive Details: Tactile cues for placement;Visual cues for safe use of DME/AE;Verbal cues for technique;Verbal cues for sequencing;Verbal cues for safe use of DME/AE;Verbal cues for Information systems manager:  Both upper extremities Wheelchair Parts Management: Needs assistance Distance: 60  Trunk/Postural Assessment  Cervical Assessment Cervical Assessment: Exceptions to [facility] (forward head posture) Thoracic Assessment Thoracic Assessment: Exceptions to [facility] (kyphotic posture; rounded shoulders) Lumbar Assessment Lumbar Assessment: Within Functional Limits Postural Control Postural Control: Deficits on evaluation Righting Reactions: delayed Protective Responses: delayed Postural Limitations: posterior pelvic tilt in sitting  Balance Balance Balance Assessed: Yes Standardized Balance Assessment Standardized Balance Assessment: Timed Up and Go Test Timed Up  and Go Test TUG: Normal TUG Normal TUG (seconds): 38.53 Static Sitting Balance Static Sitting - Balance Support: Bilateral upper extremity supported;Right upper extremity supported;Left upper extremity supported;Feet supported Static Sitting - Level of Assistance: 5: Stand by assistance Dynamic Sitting Balance Dynamic Sitting - Balance Support: Right upper extremity supported;Left upper extremity supported;Bilateral upper extremity supported;Feet supported;During functional activity (reaching to doff socks and don shoes) Dynamic Sitting - Level of Assistance: 5: Stand by assistance;4: Min assist Static Standing Balance Static Standing - Balance Support: Bilateral upper extremity supported;During functional activity Static Standing - Level of Assistance: 4: Min assist Extremity Assessment  RUE Assessment RUE Assessment: Exceptions to [facility] RUE AROM (degrees) RUE Overall AROM Comments: tremors and writhing movements due to Parkinson's RUE Strength RUE Overall Strength Comments: 4/5 overall LUE Assessment LUE Assessment: Exceptions to WFL LUE AROM (degrees) LUE Overall AROM Comments: tremors and writhing movements due to Parkinson's LUE Strength LUE Overall Strength Comments: 4/5 overall      FIM:  FIM - Control and instrumentation engineer Devices: Walker;Bed rails;Arm rests Bed/Chair Transfer: 4: Supine > Sit: Min A (steadying Pt. > 75%/lift 1 leg);5: Sit > Supine: Supervision (verbal cues/safety issues);4: Bed > Chair or W/C: Min A (steadying Pt. > 75%);4: Chair or W/C > Bed: Min A (steadying Pt. > 75%) FIM - Locomotion: Wheelchair Distance: 60 Locomotion: Wheelchair: 2: Travels 50 - 149 ft with minimal assistance (Pt.>75%) FIM - Locomotion: Ambulation Locomotion: Ambulation Assistive Devices: Administrator Ambulation/Gait Assistance: 4: Min assist Locomotion: Ambulation: 2: Travels 50 - 149 ft with minimal assistance (Pt.>75%) FIM - Locomotion: Stairs Locomotion:  Scientist, physiological: Hand rail - 2 Locomotion: Stairs: 2: Up and Down 4 - 11 stairs with minimal assistance (Pt.>75%)   Refer to Care Plan for Long Term Goals  Recommendations for other services: None  Discharge Criteria: Patient will be discharged from PT if patient refuses treatment 3 consecutive times without medical reason, if treatment goals not met, if there is a change in medical status, if patient makes no progress towards goals or if patient is discharged from hospital.  The above assessment, treatment plan, treatment alternatives and goals were discussed and mutually agreed upon: by patient  Lillia Abed. Ikran Patman, PT, DPT 12/09/2014, 12:01 PM

## 2014-12-10 ENCOUNTER — Inpatient Hospital Stay (HOSPITAL_COMMUNITY): Payer: Medicare Other | Admitting: Physical Therapy

## 2014-12-10 ENCOUNTER — Inpatient Hospital Stay (HOSPITAL_COMMUNITY): Payer: Medicare Other | Admitting: *Deleted

## 2014-12-10 NOTE — Progress Notes (Signed)
Physical Therapy Session Note  Patient Details  Name: TRINIDAD INGLE MRN: [id number] Date of Birth: [birth year]  Today's Date: 12/10/2014 PT Individual Time: 1330-1430 PT Individual Time Calculation (min): 60 min   Short Term Goals: Week 1:     Skilled Therapeutic Interventions/Progress Updates:  Pt was seen bedside in the pm. Pt transferred sit to stand multiple times with min guard to min A and verbal cues. Pt ambulated 10 x 2, 90, and 150 feet with rolling walker and min A with verbal cues. Pt performed cone taps, criss cross cone taps, and alternating cone taps for LE strengthening. Pt returned to room and left sitting up in recliner chair with quick release belt in place and call bell within reach.   Therapy Documentation Precautions:  Precautions Precautions: Fall Precaution Comments: Parkinson's Restrictions Weight Bearing Restrictions: No General:  Pain: No c.o pain.    Locomotion : Ambulation Ambulation/Gait Assistance: 4: Min assist   See FIM for current functional status  Therapy/Group: Individual Therapy  Dub Amis 12/10/2014, 3:23 PM

## 2014-12-10 NOTE — Progress Notes (Signed)
Occupational Therapy Session Note  Patient Details  Name: [name] MRN: [id number] Date of Birth: [birth year]  Today's Date: 12/10/2014 OT Individual Time: 1030-1135    1st session  OT Individual Time Calculation (min): 65 min     Short Term Goals: Week 1:  OT Short Term Goal 1 (Week 1): Pt will complete toilet transfer at supervision level OT Short Term Goal 2 (Week 1): Pt will initiate 2 rest breaks during ADL task with mod cues OT Short Term Goal 3 (Week 1): Pt will stand for 2 min during functional activity at supervision level for balance OT Short Term Goal 4 (Week 1): Pt will complete LB dressing at supervision level Week 2:     Skilled Therapeutic Interventions/Progress Updates:    1st session:  OT addressed basic ADLtraining at sink level in wc,  functional transfers, bed mobility, standing balance, attention, therapeutic activities in functional task. Pt. Lying in bed upon OT arrival.  Went from supine to EOB with min assist>wc max assist.  I;Pt had just received her Parkinson meds and had increased tremor in body, head, and unable to place feet on floor. Went from sit to stand EOB with max assist.  Pt stood for 30 seconds with mod assist.  She did stand turn to wc with mod assist.  At sink, pt did sit to stand x 3 with mod assist for peri hygiene.   Transfer to bed with mod assist and to supine with min assist.  Remained in bed with bed alarm on and call bell,phone within reach.  Therapy Documentation Precautions:  Precautions Precautions: Fall Precaution Comments: Parkinson's Restrictions Weight Bearing Restrictions: No   Pain:  None; c/o feeling weak and increased tremor with Parkinson     Other Treatments:     2nd session:  Time:  1500-1600  (60 min) Pain:  none Individual session:  Focus of treatment was bed mobility, transfers,  sitting balance, standing balance,  therapeutic activities, and endurance.   Pt. Listening to [employer] on tape that she wants to sing  at church.  Pt practiced singing for increased lung capacity, increased volume and endurance.  Pt. Had to stop about 1 minute into the song to take a break for increased SOB.    Pt. Practiced 3 times and breath control showed minimal improvements.   Practiced sit to stand 3 consecutive times for 2 sets.  Pt had increased difficulty on 3rd sit to stand.  She used arm rests for support each time.  Transferred from recliner >< BSC with minimal assist.    Left pt in recliner with safety belt on and call bell,phone within reach.      See FIM for current functional status  Therapy/Group: Individual Therapy  Lisa Roca 12/10/2014, 12:47 PM

## 2014-12-10 NOTE — Progress Notes (Signed)
[place] PHYSICAL MEDICINE & REHABILITATION     PROGRESS NOTE    Subjective/Complaints: Had a good day. Ready for breakfast! Slept well. Denies pain today  Objective: Vital Signs: Blood pressure 143/74, pulse 76, temperature 97.5 F (36.4 C), temperature source Oral, resp. rate 18, SpO2 95 %. US Renal  12/09/2014   CLINICAL DATA:  Renal mass  EXAM: RENAL/URINARY TRACT ULTRASOUND COMPLETE  COMPARISON:  CT 08/07/2014  FINDINGS: Right Kidney:  Length: 11.1 cm. Echogenicity within normal limits. No mass or hydronephrosis visualized.  Left Kidney:  Length: 10.6 cm. There is a benign lower pole cyst measuring 1.1 x 1.2 x 1.8 cm. This corresponds to the abnormality observed on CT.  Bladder:  Decompressed  IMPRESSION: Benign lower pole left renal cyst, accounting for the abnormality observed on recent CT.   Electronically Signed   By: Andreas Newport M.D.   On: 12/09/2014 03:14    Recent Labs  12/08/14 0514 12/09/14 0709  WBC 7.1 8.3  HGB 11.1* 12.1  HCT 35.6* 37.8  PLT 133* 145*    Recent Labs  12/08/14 0514 12/09/14 0709  NA 139 138  K 4.1 3.9  CL 107 104  GLUCOSE 131* 112*  BUN 14 14  CREATININE 0.81 0.77  CALCIUM 8.5 8.8   CBG (last 3)  No results for input(s): GLUCAP in the last 72 hours.  Wt Readings from Last 3 Encounters:  08/25/14 79.289 kg (174 lb 12.8 oz)  07/21/14 80.74 kg (178 lb)  04/18/14 79.833 kg (176 lb)    Physical Exam:  HENT:  Head: Normocephalic.  Eyes: EOM are normal.  Neck: Normal range of motion. Neck supple. No thyromegaly present.  Cardiovascular: Normal rate and regular rhythm.  Respiratory: Effort normal and breath sounds normal. No respiratory distress.  GI: Soft. Bowel sounds are normal. She exhibits no distension.  Neurological: She is alert.  Makes good eye contact with examiner. Appropriate to conversation. Speech is a bit dysarthric but intelligible. She easily becomes anxious during exam. Follows simple commands. She can  provide her name and age as well as date of birth. Continued pill rolling tremor bilateral UE. Persistent essential tremor in both legs. Speech halting but intelligible. Reasonable insight and awareness. Remembered me from her last stay  Skin:  Erythema over anterior shin lower extremities improving. Palpable pedal pulses. Left leg less tender. No obvious skin breakdown   Assessment/Plan: 1. Functional deficits secondary to pseudo-exacerbation of PD which require 3+ hours per day of interdisciplinary therapy in a comprehensive inpatient rehab setting. Physiatrist is providing close team supervision and 24 hour management of active medical problems listed below. Physiatrist and rehab team continue to assess barriers to discharge/monitor patient progress toward functional and medical goals. FIM: FIM - Bathing Bathing Steps Patient Completed: Chest, Right Arm, Buttocks, Front perineal area, Left Arm, Right upper leg, Left upper leg, Abdomen Bathing: 4: Min-Patient completes 8-9 73f 10 parts or 75+ percent  FIM - Upper Body Dressing/Undressing Upper body dressing/undressing steps patient completed: Thread/unthread right bra strap, Thread/unthread left bra strap, Thread/unthread left sleeve of pullover shirt/dress, Put head through opening of pull over shirt/dress, Thread/unthread right sleeve of pullover shirt/dresss Upper body dressing/undressing: 4: Min-Patient completed 75 plus % of tasks FIM - Lower Body Dressing/Undressing Lower body dressing/undressing steps patient completed: Thread/unthread right underwear leg, Thread/unthread left underwear leg, Pull underwear up/down, Thread/unthread left pants leg, Pull pants up/down Lower body dressing/undressing: 3: Mod-Patient completed 50-74% of tasks  FIM - Toileting Toileting steps completed by  patient: Adjust clothing prior to toileting, Performs perineal hygiene, Adjust clothing after toileting Toileting Assistive Devices: Grab bar or rail for  support Toileting: 4: Steadying assist  FIM - Radio producer Devices: Grab bars Toilet Transfers: 4-To toilet/BSC: Min A (steadying Pt. > 75%), 4-From toilet/BSC: Min A (steadying Pt. > 75%)  FIM - Bed/Chair Transfer Bed/Chair Transfer Assistive Devices: Walker, Bed rails, Arm rests Bed/Chair Transfer: 4: Supine > Sit: Min A (steadying Pt. > 75%/lift 1 leg), 5: Sit > Supine: Supervision (verbal cues/safety issues), 4: Bed > Chair or W/C: Min A (steadying Pt. > 75%), 4: Chair or W/C > Bed: Min A (steadying Pt. > 75%)  FIM - Locomotion: Wheelchair Distance: 60 Locomotion: Wheelchair: 2: Travels 50 - 149 ft with minimal assistance (Pt.>75%) FIM - Locomotion: Ambulation Locomotion: Ambulation Assistive Devices: Administrator Ambulation/Gait Assistance: 4: Min assist Locomotion: Ambulation: 2: Travels 50 - 149 ft with minimal assistance (Pt.>75%)  Comprehension Comprehension Mode: Auditory Comprehension: 5-Understands complex 90% of the time/Cues < 10% of the time  Expression Expression Mode: Verbal Expression: 5-Expresses basic needs/ideas: With no assist  Social Interaction Social Interaction: 6-Interacts appropriately with others with medication or extra time (anti-anxiety, antidepressant).  Problem Solving Problem Solving: 5-Solves complex 90% of the time/cues < 10% of the time  Memory Memory: 6-More than reasonable amt of time  Medical Problem List and Plan: 1. Functional deficits secondary to pseudo-exacerbation of Parkinson's disease related to bilateral lower extremity cellulitis. Cipro as directed for cellulitis 7 days and stop 2. DVT Prophylaxis/Anticoagulation: SCDs. Monitor for any signs of DVT. Check vascular study today 3. Pain Management: Oxycodone as needed. Monitor with increased mobility 4. Mood/anxiety. Ativan 0.5 mg as needed. 5. Neuropsych: This patient is capable of making decisions on her own behalf. 6. Skin/Wound Care:  Routine skin checks 7. Fluids/Electrolytes/Nutrition: Strict I and O's with follow-up chemistries 8. Parkinson's disease. Continue Mirapex 1.5 mg 3 times a day as well as Sinemet 4 times a day  -symptoms fairly pronounced but at high end dosing schedule---no changes for now LOS (Days) 2 A FACE TO FACE EVALUATION WAS PERFORMED  Joshuah Minella T 12/10/2014 9:17 AM

## 2014-12-11 ENCOUNTER — Inpatient Hospital Stay (HOSPITAL_COMMUNITY): Payer: Medicare Other | Admitting: Physical Therapy

## 2014-12-11 DIAGNOSIS — I10 Essential (primary) hypertension: Secondary | ICD-10-CM

## 2014-12-11 NOTE — Progress Notes (Signed)
[place] PHYSICAL MEDICINE & REHABILITATION     PROGRESS NOTE    Subjective/Complaints: Having left elbow pain.   Objective: Vital Signs: Blood pressure 147/71, pulse 69, temperature 97.7 F (36.5 C), temperature source Oral, resp. rate 18, SpO2 96 %. No results found.  Recent Labs  12/09/14 0709  WBC 8.3  HGB 12.1  HCT 37.8  PLT 145*    Recent Labs  12/09/14 0709  NA 138  K 3.9  CL 104  GLUCOSE 112*  BUN 14  CREATININE 0.77  CALCIUM 8.8   CBG (last 3)  No results for input(s): GLUCAP in the last 72 hours.  Wt Readings from Last 3 Encounters:  08/25/14 79.289 kg (174 lb 12.8 oz)  07/21/14 80.74 kg (178 lb)  04/18/14 79.833 kg (176 lb)    Physical Exam:  HENT:  Head: Normocephalic.  Eyes: EOM are normal.  Neck: Normal range of motion. Neck supple. No thyromegaly present.  Cardiovascular: Normal rate and regular rhythm.  Respiratory: Effort normal and breath sounds normal. No respiratory distress.  GI: Soft. Bowel sounds are normal. She exhibits no distension.  Neurological: She is alert.  Makes good eye contact with examiner. Appropriate to conversation. Speech is a bit dysarthric but intelligible. She easily becomes anxious during exam. Follows simple commands. She can provide her name and age as well as date of birth. Continued pill rolling tremor bilateral UE. Persistent essential tremor in both legs. Speech halting but intelligible. Reasonable insight and awareness. Remembered me from her last stay  Skin:  Erythema over anterior shin lower extremities improving. Palpable pedal pulses. Left leg less tender. No obvious skin breakdown  M/S: left olecranon bursa swollen/tender  Assessment/Plan: 1. Functional deficits secondary to pseudo-exacerbation of PD which require 3+ hours per day of interdisciplinary therapy in a comprehensive inpatient rehab setting. Physiatrist is providing close team supervision and 24 hour management of active medical  problems listed below. Physiatrist and rehab team continue to assess barriers to discharge/monitor patient progress toward functional and medical goals. FIM: FIM - Bathing Bathing Steps Patient Completed: Chest, Right Arm, Front perineal area, Left Arm, Right upper leg, Left upper leg, Abdomen, Right lower leg (including foot), Left lower leg (including foot) Bathing: 4: Min-Patient completes 8-9 50f 10 parts or 75+ percent  FIM - Upper Body Dressing/Undressing Upper body dressing/undressing steps patient completed: Thread/unthread right bra strap, Thread/unthread left bra strap, Thread/unthread left sleeve of pullover shirt/dress, Put head through opening of pull over shirt/dress, Thread/unthread right sleeve of pullover shirt/dresss Upper body dressing/undressing: 4: Min-Patient completed 75 plus % of tasks FIM - Lower Body Dressing/Undressing Lower body dressing/undressing steps patient completed: Thread/unthread right underwear leg, Thread/unthread left underwear leg Lower body dressing/undressing: 2: Max-Patient completed 25-49% of tasks  FIM - Toileting Toileting steps completed by patient: Adjust clothing prior to toileting, Performs perineal hygiene, Adjust clothing after toileting Toileting Assistive Devices: Grab bar or rail for support Toileting: 4: Steadying assist  FIM - Radio producer Devices: Recruitment consultant Transfers: 4-To toilet/BSC: Min A (steadying Pt. > 75%), 4-From toilet/BSC: Min A (steadying Pt. > 75%)  FIM - Control and instrumentation engineer Devices: Arm rests, Copy: 4: Chair or W/C > Bed: Min A (steadying Pt. > 75%), 4: Bed > Chair or W/C: Min A (steadying Pt. > 75%)  FIM - Locomotion: Wheelchair Distance: 60 Locomotion: Wheelchair: 2: Travels 50 - 149 ft with minimal assistance (Pt.>75%) FIM - Locomotion: Ambulation Locomotion: Ambulation Assistive Devices: Environmental consultant -  Rolling Ambulation/Gait  Assistance: 4: Min assist Locomotion: Ambulation: 4: Travels 150 ft or more with minimal assistance (Pt.>75%)  Comprehension Comprehension Mode: Auditory Comprehension: 5-Follows basic conversation/direction: With extra time/assistive device  Expression Expression Mode: Verbal Expression: 5-Expresses basic needs/ideas: With extra time/assistive device  Social Interaction Social Interaction: 5-Interacts appropriately 90% of the time - Needs monitoring or encouragement for participation or interaction.  Problem Solving Problem Solving: 4-Solves basic 75 - 89% of the time/requires cueing 10 - 24% of the time  Memory Memory: 5-Recognizes or recalls 90% of the time/requires cueing < 10% of the time  Medical Problem List and Plan: 1. Functional deficits secondary to pseudo-exacerbation of Parkinson's disease related to bilateral lower extremity cellulitis. Cipro as directed for cellulitis 7 days and stop 2. DVT Prophylaxis/Anticoagulation: SCDs. Monitor for any signs of DVT. Check vascular study today 3. Pain Management: Oxycodone as needed. Monitor with increased mobility  -ice/analgesics to left elbow. Pad as needed for comfort 4. Mood/anxiety. Ativan 0.5 mg as needed. 5. Neuropsych: This patient is capable of making decisions on her own behalf. 6. Skin/Wound Care: Routine skin checks 7. Fluids/Electrolytes/Nutrition: Strict I and O's with follow-up chemistries 8. Parkinson's disease. Continue Mirapex 1.5 mg 3 times a day as well as Sinemet 4 times a day  -symptoms fairly pronounced but at high end dosing schedule---no changes for now LOS (Days) 3 A FACE TO FACE EVALUATION WAS PERFORMED  Shaleigh Laubscher T 12/11/2014 8:36 AM

## 2014-12-11 NOTE — Progress Notes (Signed)
Pt was very confused last night and was frequently trying to get out of bed unassisted stating that "I am going home". Pt was redirected and reminded that she was in the hospital multiple times. Will continue to monitor.

## 2014-12-11 NOTE — Progress Notes (Signed)
Physical Therapy Session Note  Patient Details  Name: [name] MRN: [id number] Date of Birth: 31-Mar-1941  Today's Date: 12/11/2014 PT Individual Time: 0900-0945 PT Individual Time Calculation (min): 45 min   Skilled Therapeutic Interventions/Progress Updates:   Pt with limited anterior weight shift with transfer and standing activities throughout. Pt at times able to carry over cues and successfully perform, but difficulty carrying over consistently in session. Pt would continue to benefit from skilled PT services to increase functional mobility.   Therapy Documentation Precautions:  Precautions Precautions: Fall Precaution Comments: Parkinson's Restrictions Weight Bearing Restrictions: Yes Vital Signs: Therapy Vitals Temp: 97.7 F (36.5 C) Temp Source: Oral Pulse Rate: 69 Resp: 18 BP: (!) 147/71 mmHg Patient Position (if appropriate): Lying Oxygen Therapy SpO2: 96 % O2 Device: Not Delivered Pain: Pain Assessment Pain Assessment: 0-10 Pain Score: 5  Pain Location: Elbow Pain Orientation: Left Mobility:  Pt performs transfers training with CGA with cues for weight shift and LE placement Locomotion : Ambulation Ambulation/Gait Assistance: 4: Min assist  Other Treatments:  Pt educated on rehab plan and safety in mobility. Pt performs anterior weight shifts 2x10. Pt performs transfers 5x5 on mat, x5 on standard chair. Pt educated at length on importance anterior weight shift throughout.  See FIM for current functional status  Therapy/Group: Individual Therapy  Monia Pouch 12/11/2014, 9:30 AM

## 2014-12-12 ENCOUNTER — Inpatient Hospital Stay (HOSPITAL_COMMUNITY): Payer: Medicare Other

## 2014-12-12 ENCOUNTER — Inpatient Hospital Stay (HOSPITAL_COMMUNITY): Payer: Medicare Other | Admitting: Physical Therapy

## 2014-12-12 NOTE — Progress Notes (Signed)
Occupational Therapy Session Note  Patient Details  Name: [name] MRN: [id number] Date of Birth: 03/26/1941  Today's Date: 12/12/2014 OT Individual Time: 0700-0800 OT Individual Time Calculation (min): 60 min    Short Term Goals: Week 1:  OT Short Term Goal 1 (Week 1): Pt will complete toilet transfer at supervision level OT Short Term Goal 2 (Week 1): Pt will initiate 2 rest breaks during ADL task with mod cues OT Short Term Goal 3 (Week 1): Pt will stand for 2 min during functional activity at supervision level for balance OT Short Term Goal 4 (Week 1): Pt will complete LB dressing at supervision level  Skilled Therapeutic Interventions/Progress Updates:    Pt engaged in BADL retraining including bathing at shower level and dressing with sit<>stand from w/c at sink.  Pt required min A for stand pivot transfers w/c<>tub bench.  Pt completed bathing tasks requiring steady A when standing in shower.  Pt required steady A when standing to pull up pants.  Pt completed grooming tasks while standing at sink.  Pt exhibits considerable resting tremors but is able to complete tasks appropriately.  Focus on activity tolerance, sit<>stand, functional transfers, standing balance, and safety awareness.  Therapy Documentation Precautions:  Precautions Precautions: Fall Precaution Comments: Parkinson's Restrictions Weight Bearing Restrictions: No Pain: Pain Assessment Pain Assessment: No/denies pain  See FIM for current functional status  Therapy/Group: Individual Therapy  Leroy Libman 12/12/2014, 8:02 AM

## 2014-12-12 NOTE — Plan of Care (Signed)
Problem: RH SAFETY Goal: RH STG ADHERE TO SAFETY PRECAUTIONS W/ASSISTANCE/DEVICE STG Adhere to Safety Precautions With mod. Assistance/Device.  Outcome: Progressing Pt confused @ hs and trying to get out of bed unassisted stating that she "needs to go home and cook dinner". Bed alarm on and pt. frequently reminded to call for assistance when needed Goal: RH STG DECREASED RISK OF FALL WITH ASSISTANCE STG Decreased Risk of Fall With mod. Assistance.  Outcome: Progressing Frequently tries to get out of bed unassisted. Bed alarm on. When patient has tremors, can be a max assist for transfers

## 2014-12-12 NOTE — Progress Notes (Signed)
Physical Therapy Session Note  Patient Details  Name: [name] MRN: [id number] Date of Birth: Feb 03, 1941  Today's Date: 12/12/2014 PT Individual Time: 0800-0900 PT Individual Time Calculation (min): 60 min   Skilled Therapeutic Interventions/Progress Updates:   Patient received sitting in recliner. Session focused on activity tolerance, ambulation, and safety with functional mobility. Patient ambulated around room using RW to locate shoes and donned shoes seated in recliner. Patient required verbal cues for upright posture and to keep RW closer to body. Gait training using RW room > therapy gym with seated rest at elevators with supervision. Trialled rollator as patient reports this is what she will use at home. Gait training using rollator 2 x 50 ft with supervision, cues for locking brakes before sitting and safe hand placement 50% of time. Patient performed furniture transfers to low compliant couch surface with multiple attempts at standing but overall supervision using rollator. In ADL apartment, patient performed bed mobility on flat surface with supervision. Patient ambulated using rollator in home environment to put pillows back on bed and pull up the comforter with supervision and good use of brakes for improved safety. Patient requesting to use bathroom in ADL apartment with overall supervision, see FIM for details. Patient negotiated up/down 15 stairs using 2 rails with supervision and alternating pattern with supervision. Patient required frequent seated rest breaks due to fatigue. Patient ambulated back to room using rollator x 150 ft with supervision and left sitting in recliner with quick release belt on and needs within reach.   Therapy Documentation Precautions:  Precautions Precautions: Fall Precaution Comments: Parkinson's Restrictions Weight Bearing Restrictions: No Pain: Pain Assessment Pain Assessment: 0-10 Pain Score: 5  Pain Type: Acute pain Pain Location:  Elbow Pain Orientation: Left Pain Descriptors / Indicators: Aching Pain Frequency: Intermittent Pain Onset: On-going Pain Intervention(s): Emotional support, ice at end of session Locomotion : Ambulation Ambulation/Gait Assistance: 5: Supervision   See FIM for current functional status  Therapy/Group: Individual Therapy  Laretta Alstrom 12/12/2014, 9:04 AM

## 2014-12-12 NOTE — Progress Notes (Signed)
[place] PHYSICAL MEDICINE & REHABILITATION     PROGRESS NOTE    Subjective/Complaints: Had a better night. Left elbow feeling better. Tremors less severe  Objective: Vital Signs: Blood pressure 110/57, pulse 68, temperature 97.8 F (36.6 C), temperature source Oral, resp. rate 18, SpO2 98 %. No results found. No results for input(s): WBC, HGB, HCT, PLT in the last 72 hours. No results for input(s): NA, K, CL, GLUCOSE, BUN, CREATININE, CALCIUM in the last 72 hours.  Invalid input(s): CO CBG (last 3)  No results for input(s): GLUCAP in the last 72 hours.  Wt Readings from Last 3 Encounters:  08/25/14 79.289 kg (174 lb 12.8 oz)  07/21/14 80.74 kg (178 lb)  04/18/14 79.833 kg (176 lb)    Physical Exam:  HENT:  Head: Normocephalic.  Eyes: EOM are normal.  Neck: Normal range of motion. Neck supple. No thyromegaly present.  Cardiovascular: Normal rate and regular rhythm.  Respiratory: Effort normal and breath sounds normal. No respiratory distress.  GI: Soft. Bowel sounds are normal. She exhibits no distension.  Neurological: She is alert.  Makes good eye contact with examiner. Appropriate to conversation. Speech is a bit dysarthric but intelligible. She easily becomes anxious during exam. Follows simple commands. She can provide her name and age as well as date of birth. Continued pill rolling tremor bilateral UE. Persistent essential tremor in both legs. Speech halting but intelligible. Reasonable insight and awareness. Remembered me from her last stay  Skin:  Erythema over anterior shin lower extremities improving. Palpable pedal pulses. Left leg less tender. No obvious skin breakdown  M/S: left olecranon bursa swollen but less tender  Assessment/Plan: 1. Functional deficits secondary to pseudo-exacerbation of PD which require 3+ hours per day of interdisciplinary therapy in a comprehensive inpatient rehab setting. Physiatrist is providing close team supervision and  24 hour management of active medical problems listed below. Physiatrist and rehab team continue to assess barriers to discharge/monitor patient progress toward functional and medical goals. FIM: FIM - Bathing Bathing Steps Patient Completed: Chest, Right Arm, Front perineal area, Left Arm, Right upper leg, Left upper leg, Abdomen, Right lower leg (including foot), Left lower leg (including foot) Bathing: 4: Min-Patient completes 8-9 97f 10 parts or 75+ percent  FIM - Upper Body Dressing/Undressing Upper body dressing/undressing steps patient completed: Thread/unthread right sleeve of pullover shirt/dresss, Thread/unthread left sleeve of pullover shirt/dress, Put head through opening of pull over shirt/dress, Pull shirt over trunk Upper body dressing/undressing: 4: Min-Patient completed 75 plus % of tasks FIM - Lower Body Dressing/Undressing Lower body dressing/undressing steps patient completed: Thread/unthread right underwear leg, Thread/unthread left underwear leg Lower body dressing/undressing: 1: Total-Patient completed less than 25% of tasks  FIM - Toileting Toileting steps completed by patient: Adjust clothing prior to toileting, Adjust clothing after toileting Toileting Assistive Devices: Grab bar or rail for support Toileting: 3: Mod-Patient completed 2 of 3 steps  FIM - Radio producer Devices: Recruitment consultant Transfers: 4-To toilet/BSC: Min A (steadying Pt. > 75%), 4-From toilet/BSC: Min A (steadying Pt. > 75%)  FIM - Bed/Chair Transfer Bed/Chair Transfer Assistive Devices: Copy: 4: Chair or W/C > Bed: Min A (steadying Pt. > 75%), 4: Bed > Chair or W/C: Min A (steadying Pt. > 75%)  FIM - Locomotion: Wheelchair Distance: 60 Locomotion: Wheelchair: 2: Travels 50 - 149 ft with minimal assistance (Pt.>75%) FIM - Locomotion: Ambulation Locomotion: Ambulation Assistive Devices: Administrator Ambulation/Gait Assistance: 4:  Min assist Locomotion: Ambulation:  2: Travels 50 - 149 ft with minimal assistance (Pt.>75%)  Comprehension Comprehension Mode: Auditory Comprehension: 5-Follows basic conversation/direction: With extra time/assistive device  Expression Expression Mode: Verbal Expression: 5-Expresses basic needs/ideas: With extra time/assistive device  Social Interaction Social Interaction: 5-Interacts appropriately 90% of the time - Needs monitoring or encouragement for participation or interaction.  Problem Solving Problem Solving: 4-Solves basic 75 - 89% of the time/requires cueing 10 - 24% of the time  Memory Memory: 5-Recognizes or recalls 90% of the time/requires cueing < 10% of the time  Medical Problem List and Plan: 1. Functional deficits secondary to pseudo-exacerbation of Parkinson's disease related to bilateral lower extremity cellulitis. Cipro as directed for cellulitis 7 days and stop 2. DVT Prophylaxis/Anticoagulation: SCDs. Monitor for any signs of DVT. Check vascular study today 3. Pain Management: Oxycodone as needed. Monitor with increased mobility  -ice/analgesics to left elbow. Pad as needed for comfort 4. Mood/anxiety. Ativan 0.5 mg as needed. 5. Neuropsych: This patient is capable of making decisions on her own behalf. 6. Skin/Wound Care: Routine skin checks 7. Fluids/Electrolytes/Nutrition: Strict I and O's with follow-up chemistries 8. Parkinson's disease. Continue Mirapex 1.5 mg 3 times a day as well as Sinemet 4 times a day  -symptoms wax and wane per patient (less at moment) LOS (Days) 4 A FACE TO FACE EVALUATION WAS PERFORMED  SWARTZ,ZACHARY T 12/12/2014 7:39 AM

## 2014-12-12 NOTE — Progress Notes (Signed)
Physical Therapy Session Note  Patient Details  Name: [name] MRN: [id number] Date of Birth: 1940/10/07  Today's Date: 12/12/2014 PT Individual Time: 1530-1630 PT Individual Time Calculation (min): 60 min   Short Term Goals: Week 1:     Skilled Therapeutic Interventions/Progress Updates:    Pt received seated in w/c, agreeable to participate in therapy. Session focused on functional endurance, household ambulation and furniture transfers, standing balance. Pt ambulated around unit up to 200' at a time w/ Rollator and supervision-SBA. Pt required one cue during session to lock brakes on Rollator before standing up, otherwise did not require additional cueing. Pt navigated obstacle course around cones and over hurdles 1 bout w/ Rollator and 1 bout w/ HHA to challenge balance/dynamic gait. For standing balance pt stood on compliant surface for 60 seconds with graded support from therapist. In standing pt reached to anterior and L/R excursions outside BOS to stack cones to challenge balance. Instructed pt in multiple sit<>stands from sofa in ADL apartment, pt educated on strategies to stand from low compliant surfaces including scooting to edge, rocking for momentum, and moving hands to push up from armrest vs pushing up from cushion. Pt completed multiple sit<>stands from chair around rehab unit w/ SBA. Pt tolerated session well. Pt left seated in recliner w/ quick release belt on and all needs within reach   Therapy Documentation Precautions:  Precautions Precautions: Fall Precaution Comments: Parkinson's Restrictions Weight Bearing Restrictions: No Pain: Pain Assessment Pain Assessment: 0-10 Pain Score: 5  Pain Location: Elbow Pain Orientation: Left Pain Descriptors / Indicators: Aching Pain Frequency: Intermittent Pain Intervention(s): Ice pack  See FIM for current functional status  Therapy/Group: Individual Therapy  Rada Hay 12/12/2014, 7:55 AM

## 2014-12-13 ENCOUNTER — Inpatient Hospital Stay (HOSPITAL_COMMUNITY): Payer: Medicare Other | Admitting: Physical Therapy

## 2014-12-13 ENCOUNTER — Inpatient Hospital Stay (HOSPITAL_COMMUNITY): Payer: Medicare Other

## 2014-12-13 LAB — CULTURE, BLOOD (ROUTINE X 2)
CULTURE: NO GROWTH
Culture: NO GROWTH

## 2014-12-13 MED ORDER — PRAMIPEXOLE DIHYDROCHLORIDE 1.5 MG PO TABS
1.5000 mg | ORAL_TABLET | Freq: Once | ORAL | Status: AC
  Administered 2014-12-13: 1.5 mg via ORAL
  Filled 2014-12-13: qty 1

## 2014-12-13 MED ORDER — PRAMIPEXOLE DIHYDROCHLORIDE 1.5 MG PO TABS
1.5000 mg | ORAL_TABLET | Freq: Three times a day (TID) | ORAL | Status: DC
  Administered 2014-12-13 – 2014-12-17 (×12): 1.5 mg via ORAL
  Filled 2014-12-13 (×19): qty 1

## 2014-12-13 NOTE — Progress Notes (Signed)
Occupational Therapy Note  Patient Details  Name: [name] MRN: [id number] Date of Birth: June 16, 1941  Today's Date: 12/13/2014 OT Individual Time: 1100-1200 OT Individual Time Calculation (min): 60 min   Pt denied pain Individual Therapy  Pt resting in bed upon arrival and agreeable to participating in therapy.  Pt amb with Rollator to ADL apartment and practiced tub bench transfers, bed mobility, and home mgmt tasks including simulated laundry duties.  Pt transitioned to therapy gym to retrieve items from various heights and transport to day room and BI room.  Pt placed items on surface requiring patient to reach outside BOS.  Pt returned to day room and watered plants before returning to recliner in room.  Focus on activity tolerance, functional amb with Rollator for home mgmt tasks, standing balance, and safety awareness.   Leotis Shames [facility] 12/13/2014, 12:05 PM

## 2014-12-13 NOTE — Progress Notes (Signed)
[facility] PHYSICAL MEDICINE & REHABILITATION     PROGRESS NOTE    Subjective/Complaints: No new complaints. Left elbow is much better.   Objective: Vital Signs: Blood pressure 130/64, pulse 66, temperature 98.2 F (36.8 C), temperature source Oral, resp. rate 18, SpO2 96 %. No results found. No results for input(s): WBC, HGB, HCT, PLT in the last 72 hours. No results for input(s): NA, K, CL, GLUCOSE, BUN, CREATININE, CALCIUM in the last 72 hours.  Invalid input(s): CO CBG (last 3)  No results for input(s): GLUCAP in the last 72 hours.  Wt Readings from Last 3 Encounters:  08/25/14 79.289 kg (174 lb 12.8 oz)  07/21/14 80.74 kg (178 lb)  04/18/14 79.833 kg (176 lb)    Physical Exam:  HENT:  Head: Normocephalic.  Eyes: EOM are normal.  Neck: Normal range of motion. Neck supple. No thyromegaly present.  Cardiovascular: Normal rate and regular rhythm.  Respiratory: Effort normal and breath sounds normal. No respiratory distress.  GI: Soft. Bowel sounds are normal. She exhibits no distension.  Neurological: She is alert.    Speech is a bit dysarthric but intelligible. She easily becomes anxious during exam. Follows simple commands. She can provide her name and age as well as date of birth. Continued pill rolling tremor bilateral UE. Persistent essential tremor in both legs. Speech halting but intelligible. Reasonable insight and awareness.   Skin:  Erythema over anterior shin lower extremities improving. Palpable pedal pulses. Left leg less tender. No obvious skin breakdown  M/S: left olecranon bursa swollen and non tender  Assessment/Plan: 1. Functional deficits secondary to pseudo-exacerbation of PD which require 3+ hours per day of interdisciplinary therapy in a comprehensive inpatient rehab setting. Physiatrist is providing close team supervision and 24 hour management of active medical problems listed below. Physiatrist and rehab team continue to assess barriers to  discharge/monitor patient progress toward functional and medical goals. FIM: FIM - Bathing Bathing Steps Patient Completed: Chest, Right Arm, Front perineal area, Left Arm, Right upper leg, Left upper leg, Abdomen, Right lower leg (including foot), Left lower leg (including foot) Bathing: 4: Steadying assist  FIM - Upper Body Dressing/Undressing Upper body dressing/undressing steps patient completed: Thread/unthread right sleeve of pullover shirt/dresss, Thread/unthread left sleeve of pullover shirt/dress, Put head through opening of pull over shirt/dress, Pull shirt over trunk, Thread/unthread right bra strap, Thread/unthread left bra strap, Hook/unhook bra Upper body dressing/undressing: 5: Supervision: Safety issues/verbal cues FIM - Lower Body Dressing/Undressing Lower body dressing/undressing steps patient completed: Thread/unthread right underwear leg, Thread/unthread left underwear leg, Pull underwear up/down, Thread/unthread right pants leg, Thread/unthread left pants leg, Pull pants up/down, Don/Doff right sock, Don/Doff left sock, Don/Doff right shoe, Don/Doff left shoe Lower body dressing/undressing: 5: Supervision: Safety issues/verbal cues  FIM - Toileting Toileting steps completed by patient: Adjust clothing prior to toileting, Performs perineal hygiene, Adjust clothing after toileting Toileting Assistive Devices: Grab bar or rail for support Toileting: 4: Steadying assist  FIM - Radio producer Devices: Grab bars, Insurance account manager Transfers: 5-To toilet/BSC: Supervision (verbal cues/safety issues), 5-From toilet/BSC: Supervision (verbal cues/safety issues)  FIM - Control and instrumentation engineer Devices: Environmental consultant, Arm rests Bed/Chair Transfer: 5: Bed > Chair or W/C: Supervision (verbal cues/safety issues), 5: Chair or W/C > Bed: Supervision (verbal cues/safety issues)  FIM - Locomotion: Wheelchair Distance: 60 Locomotion: Wheelchair: 0:  Activity did not occur FIM - Locomotion: Ambulation Locomotion: Ambulation Assistive Devices: Other (comment) (Rollator) Ambulation/Gait Assistance: 5: Supervision Locomotion: Ambulation: 5: Travels 150  ft or more with supervision/safety issues  Comprehension Comprehension Mode: Auditory Comprehension: 5-Follows basic conversation/direction: With no assist  Expression Expression Mode: Verbal Expression: 5-Expresses basic needs/ideas: With no assist  Social Interaction Social Interaction: 5-Interacts appropriately 90% of the time - Needs monitoring or encouragement for participation or interaction.  Problem Solving Problem Solving: 5-Solves basic problems: With no assist  Memory Memory: 5-Recognizes or recalls 90% of the time/requires cueing < 10% of the time  Medical Problem List and Plan: 1. Functional deficits secondary to pseudo-exacerbation of Parkinson's disease related to bilateral lower extremity cellulitis. Cipro as directed for cellulitis 7 days and stop 2. DVT Prophylaxis/Anticoagulation: SCDs. Monitor for any signs of DVT. Check vascular study today 3. Pain Management: Oxycodone as needed. Monitor with increased mobility  -ice/analgesics to left elbow. Pad as needed for comfort 4. Mood/anxiety. Ativan 0.5 mg as needed. 5. Neuropsych: This patient is capable of making decisions on her own behalf. 6. Skin/Wound Care: Routine skin checks 7. Fluids/Electrolytes/Nutrition: Strict I and O's with follow-up chemistries 8. Parkinson's disease. Continue Mirapex 1.5 mg 3 times a day as well as Sinemet 4 times a day  -symptoms wax and wane per patient  LOS (Days) 5 A FACE TO FACE EVALUATION WAS PERFORMED  Kemet Nijjar T 12/13/2014 8:13 AM

## 2014-12-13 NOTE — Progress Notes (Signed)
Social Work Patient ID: [name], female   DOB: March 22, 1941, 74 y.o.   MRN: [id number]  Lowella Curb, LCSW Social Worker Signed  Patient Care Conference 12/13/2014  4:29 PM    Expand All Collapse All   Inpatient RehabilitationTeam Conference and Plan of Care Update Date: 12/13/2014   Time: 2:45 PM     Patient Name: [name]       Medical Record Number: [id number]  Date of Birth: 1941-05-28 Sex: Female         Room/Bed: 4W18C/4W18C-01 Payor Info: Payor: MEDICARE / Plan: MEDICARE PART A AND B / Product Type: *No Product type* /    Admitting Diagnosis: Parkinsons  B LE cellulitis    Admit Date/Time:  12/08/2014  2:56 PM Admission Comments: No comment available   Primary Diagnosis:  Parkinson's disease Principal Problem: Parkinson's disease    Patient Active Problem List     Diagnosis  Date Noted   .  Cellulitis  12/07/2014   .  Lower extremity pain  12/07/2014   .  Cellulitis of left lower extremity     .  Nausea vomiting and diarrhea  08/07/2014   .  Enteritis  08/07/2014   .  Osteoarthritis of spine with radiculopathy, cervical region  02/11/2014   .  Chronic constipation  02/11/2014   .  Primary freezing of gait  02/11/2014   .  Abnormality of gait  01/28/2014   .  Physical deconditioning  10/05/2013   .  Thrombocytopenia, unspecified  09/30/2013   .  Rectal bleeding  09/29/2013   .  UGI bleed  09/29/2013   .  Anemia due to blood loss, acute  09/29/2013   .  Anemia, iron deficiency  04/09/2013   .  Cameron lesion, acute  01/08/2012   .  Acute urinary retention, resolved after foley and no re-occurrence, 12/11/11   12/24/2011   .  Cellulitis of lower leg  12/24/2011   .  Diastolic dysfunction, grade 2  12/15/2011   .  CAP (community acquired pneumonia)   12/15/2011   .  Abnormal EKG,(TWI lead 3 on one of 7 EKGs this admission)  12/12/2011   .  Chest pain, MI R/O, negative MI- secondary to CAP 12/10/11  12/10/2011   .  Cor pulmonale  12/10/2011   .  Syncope and collapse,  pt fell and did not know why she fell.  12/10/2011   .  Hematemesis  09/26/2011   .  Acute blood loss anemia  09/26/2011   .  Hypotension  09/26/2011   .  Leucocytosis  09/26/2011   .  Aspiration of blood  09/26/2011   .  Parkinson's disease  09/26/2011   .  Essential hypertension  09/26/2011   .  GI bleed: January 2013, Hgb stable, stool negative  09/26/2011   .  Gastric ulcer with hemorrhage  09/26/2011   .  Esophagitis, erosive  09/26/2011     Expected Discharge Date: Expected Discharge Date: 12/17/14  Team Members Present: Physician leading conference: Dr. Alger Simons Social Worker Present: Lennart Pall, LCSW Nurse Present: Elliot Cousin, RN PT Present: Carney Living, PT OT Present: Gareth Morgan, OT;Roanna Epley, COTA SLP Present: Weston Anna, SLP Other (Discipline and Name): Danne Baxter, RN [facility])        Current Status/Progress  Goal  Weekly Team Focus   Medical     parkinson's disease with pseudo-exacerbation due to cellulitis. some hs confusion  improve functional mobility.  pd mgt, pain control   Bowel/Bladder     Pt continent of bowel and bladder. Increased frequency with bladder. pt uses BSC  Mod I  cont. plan of care with [facility]    Swallow/Nutrition/ Hydration               ADL's     bathing-steady A; LB dressing-steady A; functional transfers/ambulation-steady A/close supervision; limited activity tolerance   mod I overall  activity tolerance, standing balance, BADLs, safety awareness    Mobility     supervision using rollator x 150 ft and 15 stairs using 2 rails, frequent rest breaks  mod I overall   activity tolerance, functional mobility, transfers, standing balance, pt education   Communication               Safety/Cognition/ Behavioral Observations              Pain     pain to left elbow- ice scheduled and prn tylenol 650mg  q4 hrs   3 or less  assess pain q shift. contiune use of ice pack and medicate as needed   Skin     BLE dry flakey  skin; scattered bruising noted   free of skin breakdown min assist  assess skin q shift     Rehab Goals Patient on target to meet rehab goals: Yes *See Care Plan and progress notes for long and short-term goals.    Barriers to Discharge:  baseline neuro sx, confusion at night      Possible Resolutions to Barriers:   mod I goals, intermittent supervision      Discharge Planning/Teaching Needs:   home with intermittent assist of her brothers        Team Discussion:    Reaching supervision level with ADLs and mobility.  Some confusion at night.  Can ambulate ~ 150', however, needs rest breaks.  On track to meet mod i goals.   Revisions to Treatment Plan:    None    Continued Need for Acute Rehabilitation Level of Care: The patient requires daily medical management by a physician with specialized training in physical medicine and rehabilitation for the following conditions: Daily direction of a multidisciplinary physical rehabilitation program to ensure safe treatment while eliciting the highest outcome that is of practical value to the patient.: Yes Daily medical management of patient stability for increased activity during participation in an intensive rehabilitation regime.: Yes Daily analysis of laboratory values and/or radiology reports with any subsequent need for medication adjustment of medical intervention for : Post surgical problems;Neurological problems  [name] 12/13/2014, 4:29 PM

## 2014-12-13 NOTE — Plan of Care (Signed)
Problem: RH Wheelchair Mobility Goal: LTG Patient will propel w/c in controlled environment (PT) LTG: Patient will propel wheelchair in controlled environment, # of feet with assist (PT)  Outcome: Not Applicable Date Met:  41/96/22 Goal d/c due to patient ambulatory.  Goal: LTG Patient will propel w/c in home environment (PT) LTG: Patient will propel wheelchair in home environment, # of feet with assistance (PT).  Outcome: Not Applicable Date Met:  29/79/89 Goal d/c due to patient ambulatory. Goal: LTG Patient will propel w/c in community environment (PT) LTG: Patient will propel wheelchair in community environment, # of feet with assist (PT)  Outcome: Not Applicable Date Met:  21/19/41 Goal d/c due to patient ambulatory.

## 2014-12-13 NOTE — Patient Care Conference (Signed)
Inpatient RehabilitationTeam Conference and Plan of Care Update Date: 12/13/2014   Time: 2:45 PM    Patient Name: [name]      Medical Record Number: [id number]  Date of Birth: 03/20/1941 Sex: Female         Room/Bed: 4W18C/4W18C-01 Payor Info: Payor: MEDICARE / Plan: MEDICARE PART A AND B / Product Type: *No Product type* /    Admitting Diagnosis: Parkinsons  B LE cellulitis   Admit Date/Time:  12/08/2014  2:56 PM Admission Comments: No comment available   Primary Diagnosis:  Parkinson's disease Principal Problem: Parkinson's disease  Patient Active Problem List   Diagnosis Date Noted  . Cellulitis 12/07/2014  . Lower extremity pain 12/07/2014  . Cellulitis of left lower extremity   . Nausea vomiting and diarrhea 08/07/2014  . Enteritis 08/07/2014  . 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 Chronic constipation 02/11/2014  . Primary freezing of gait 02/11/2014  . Abnormality of gait 01/28/2014  . Physical deconditioning 10/05/2013  . Thrombocytopenia, unspecified 09/30/2013  . Rectal bleeding 09/29/2013  . UGI bleed 09/29/2013  . Anemia due to blood loss, acute 09/29/2013  . Anemia, iron deficiency 04/09/2013  . Cameron lesion, acute 01/08/2012  . Acute urinary retention, resolved after foley and no re-occurrence, 12/11/11  12/24/2011  . Cellulitis of lower leg 12/24/2011  . Diastolic dysfunction, grade 2 12/15/2011  . CAP (community acquired pneumonia)  12/15/2011  . Abnormal EKG,(TWI lead 3 on one of 7 EKGs this admission) 12/12/2011  . Chest pain, MI R/O, negative MI- secondary to CAP 12/10/11 12/10/2011  . Cor pulmonale 12/10/2011  . Syncope and collapse, pt fell and did not know why she fell. 12/10/2011  . Hematemesis 09/26/2011  . Acute blood loss anemia 09/26/2011  . Hypotension 09/26/2011  . Leucocytosis 09/26/2011  . Aspiration of blood 09/26/2011  . Parkinson's disease 09/26/2011  . Essential hypertension 09/26/2011  . GI  bleed: January 2013, Hgb stable, stool negative 09/26/2011  . Gastric ulcer with hemorrhage 09/26/2011  . Esophagitis, erosive 09/26/2011    Expected Discharge Date: Expected Discharge Date: 12/17/14  Team Members Present: Physician leading conference: Dr. Alger Simons Social Worker Present: Lennart Pall, LCSW Nurse Present: Elliot Cousin, RN PT Present: Carney Living, PT OT Present: Gareth Morgan, OT;Roanna Epley, COTA SLP Present: Weston Anna, SLP Other (Discipline and Name): Danne Baxter, RN [facility])     Current Status/Progress Goal Weekly Team Focus  Medical   parkinson's disease with pseudo-exacerbation due to cellulitis. some hs confusion  improve functional mobility.   pd mgt, pain control   Bowel/Bladder   Pt continent of bowel and bladder. Increased frequency with bladder. pt uses BSC  Mod I  cont. plan of care with [facility]    Swallow/Nutrition/ Hydration             ADL's   bathing-steady A; LB dressing-steady A; functional transfers/ambulation-steady A/close supervision; limited activity tolerance  mod I overall  activity tolerance, standing balance, BADLs, safety awareness   Mobility   supervision using rollator x 150 ft and 15 stairs using 2 rails, frequent rest breaks  mod I overall   activity tolerance, functional mobility, transfers, standing balance, pt education   Communication             Safety/Cognition/ Behavioral Observations            Pain   pain to left elbow- ice scheduled and prn tylenol 650mg  q4 hrs  3 or less  assess pain  q shift. contiune use of ice pack and medicate as needed   Skin   BLE dry flakey skin; scattered bruising noted  free of skin breakdown min assist  assess skin q shift     Rehab Goals Patient on target to meet rehab goals: Yes *See Care Plan and progress notes for long and short-term goals.  Barriers to Discharge: baseline neuro sx, confusion at night    Possible Resolutions to Barriers:  mod I goals, intermittent  supervision    Discharge Planning/Teaching Needs:  home with intermittent assist of her brothers      Team Discussion:  Reaching supervision level with ADLs and mobility.  Some confusion at night.  Can ambulate ~ 150', however, needs rest breaks.  On track to meet mod i goals.  Revisions to Treatment Plan:  None   Continued Need for Acute Rehabilitation Level of Care: The patient requires daily medical management by a physician with specialized training in physical medicine and rehabilitation for the following conditions: Daily direction of a multidisciplinary physical rehabilitation program to ensure safe treatment while eliciting the highest outcome that is of practical value to the patient.: Yes Daily medical management of patient stability for increased activity during participation in an intensive rehabilitation regime.: Yes Daily analysis of laboratory values and/or radiology reports with any subsequent need for medication adjustment of medical intervention for : Post surgical problems;Neurological problems  Melroy Bougher 12/13/2014, 4:29 PM

## 2014-12-13 NOTE — Progress Notes (Signed)
Physical Therapy Session Note  Patient Details  Name: [name] MRN: [id number] Date of Birth: [birth year]  Today's Date: 12/13/2014 PT Individual Time: 0900-1000  PT Individual Time Calculation (min): 60 min   Short Term Goals: Week 1:  PT Short Term Goal 1 (Week 1): = LTGs due to ELOS  Skilled Therapeutic Interventions/Progress Updates:   Session 1: Focus on sit <> stand transfers, gait, balance, and activity tolerance. Patient received sitting in recliner, reporting increased fatigue this date. After multiple attempts for sit <> stand, patient required min A to stand from recliner. Gait training using rollator x 150 ft with supervision. Patient instructed in sit <> stand from standard arm chair without UE support x 5, cues for forward lean and upright posture in standing. Patient fatigues quickly and requires frequent rest breaks. Performed 5TSS, see below. Gait training without AD 2 x 50 ft, with supervision and one LOB requiring min A due to scissoring. Patient required verbal cues for R heel strike due to decreased R foot clearance with ineffective carryover within session. Patient participated in two rounds of horseshoes, reaching outside BOS with BUE to retrieve horseshoes, standing without UE support, and ambulating to retrieve horseshoes from floor using rollator with overall supervision. Patient did not require any cues for locking brakes or hand placement with sit <> stand throughout session. Patient performed floor transfer with max A due to c/o knee OA pain and educated on falls safety. Patient ambulated to day room to water flowers using counter for UE support and returned to room using rollator with supervision. Patient left sitting edge of bed with RN present.  Five times Sit to Stand Test (FTSS) Method: Use a straight back chair with a solid seat that is 16-18" high. Ask participant to sit on the chair with arms folded across their chest.   Instructions: "Stand up and sit down  as quickly as possible 5 times, keeping your arms folded across your chest."   Measurement: Stop timing when the participant stands the 5th time.  TIME: ___60___ (in seconds)  Times > 13.6 seconds is associated with increased disability and morbidity (Guralnik, 2000) Times > 15 seconds is predictive of recurrent falls in healthy individuals aged 42 and older (Buatois, et al., 2008) Normal performance values in community dwelling individuals aged 4 and older (Bohannon, 2006): o 60-69 years: 11.4 seconds o 70-79 years: 12.6 seconds o 80-89 years: 14.8 seconds  MCID: ? 2.3 seconds for Vestibular Disorders Mariah Milling, 2006)  Therapy Documentation Precautions:  Precautions Precautions: Fall Precaution Comments: Parkinson's Restrictions Weight Bearing Restrictions: No Pain: Pain Assessment Pain Assessment: No/denies pain Locomotion : Ambulation Ambulation/Gait Assistance: 5: Supervision   See FIM for current functional status  Therapy/Group: Individual Therapy  Laretta Alstrom 12/13/2014, 12:07 PM

## 2014-12-13 NOTE — Progress Notes (Signed)
Occupational Therapy Session Note  Patient Details  Name: [name] MRN: [id number] Date of Birth: [birth year]  Today's Date: 12/13/2014 OT Individual Time: 0700-0800 OT Individual Time Calculation (min): 60 min    Short Term Goals: Week 1:  OT Short Term Goal 1 (Week 1): Pt will complete toilet transfer at supervision level OT Short Term Goal 2 (Week 1): Pt will initiate 2 rest breaks during ADL task with mod cues OT Short Term Goal 3 (Week 1): Pt will stand for 2 min during functional activity at supervision level for balance OT Short Term Goal 4 (Week 1): Pt will complete LB dressing at supervision level  Skilled Therapeutic Interventions/Progress Updates:    Pt engaged in BADL retraining including bathing at shower level and dressing with sit<>stand from w/c at sink.  Pt selected clothing before entering bathroom to transfer to shower.  Pt completed all task at supervision level with appropriate rest breaks.  Focus on activity tolerance, sit<>stand, standing balance, functional amb with Rollator, and safety awareness.  Therapy Documentation Precautions:  Precautions Precautions: Fall Precaution Comments: Parkinson's Restrictions Weight Bearing Restrictions: No Pain:  Pt denies pain  See FIM for current functional status  Therapy/Group: Individual Therapy  Leroy Libman 12/13/2014, 8:01 AM

## 2014-12-14 ENCOUNTER — Inpatient Hospital Stay (HOSPITAL_COMMUNITY): Payer: Medicare Other | Admitting: *Deleted

## 2014-12-14 ENCOUNTER — Inpatient Hospital Stay (HOSPITAL_COMMUNITY): Payer: Medicare Other | Admitting: Occupational Therapy

## 2014-12-14 ENCOUNTER — Inpatient Hospital Stay (HOSPITAL_COMMUNITY): Payer: Medicare Other | Admitting: Physical Therapy

## 2014-12-14 NOTE — Progress Notes (Signed)
Occupational Therapy Session Note  Patient Details  Name: ROSY ESTABROOK MRN: [id number] Date of Birth: [birth year]  Today's Date: 12/14/2014 OT Individual Time: 1256-1356 OT Individual Time Calculation (min): 60 min    Short Term Goals: Week 1:  OT Short Term Goal 1 (Week 1): Pt will complete toilet transfer at supervision level OT Short Term Goal 2 (Week 1): Pt will initiate 2 rest breaks during ADL task with mod cues OT Short Term Goal 3 (Week 1): Pt will stand for 2 min during functional activity at supervision level for balance OT Short Term Goal 4 (Week 1): Pt will complete LB dressing at supervision level  Skilled Therapeutic Interventions/Progress Updates:  Upon entering the room, pt seated in recliner chair with no c/o pain this session. Pt reports she had just returned from bathroom and was ready for session. OT propelled pt in wheelchair for energy conservation down the elevator and to gift shop. Once in gift shop, pt ambulated down aisles with use of rollator, reached upwards,down, and laterally to obtain items with close supervision. Pt ambulating in gift shop for ~ 19 minutes before requiring rest break. Pt then ambulating outside with rollator on varied surfaced and up and down ramp at parking lot entrance with supervision. Pt seated again in wheelchair and assisted back upstairs to room secondary to fatigue. Pt ambulated ~ 20 feet into room and seated at recliner chair with rollator. Call bell and all needed items within reach upon exiting the room.   Therapy Documentation Precautions:  Precautions Precautions: Fall Precaution Comments: Parkinson's Restrictions Weight Bearing Restrictions: No  See FIM for current functional status  Therapy/Group: Individual Therapy  Phineas Semen 12/14/2014, 4:25 PM

## 2014-12-14 NOTE — Progress Notes (Signed)
Physical Therapy Session Note  Patient Details  Name: [name] MRN: [id number] Date of Birth: [birth year]  Today's Date: 12/14/2014 PT Individual Time: 1410-1510 PT Individual Time Calculation (min): 60 min   Short Term Goals: Week 1:  PT Short Term Goal 1 (Week 1): = LTGs due to ELOS      Skilled Therapeutic Interventions/Progress Updates:  Tx focused on functional mobility training in apartment, gait with rollator, and supine therex once pt unable to continue standing activities due to fatigue. Pt's mobility declined from beginning to end of tx due to increased fatigue from the day's therapies and episodes of "freezing."  Discussed pt's plan for when she goes home and continues to have possibly difficult episodes. She reported having no concerns and "calling an ambulance" when she falls. PT discussed home safety and falls risk, but she remained resistant to consider options for assist at home.   Pt performed transfers with varying assist needed from close S>max A depending on fatigue. Performed furniture transfers as well as stand-step rollator>WC. Pt needed safety cues throughout as well.   Pt performed gait with rollator in controlled and home setting, needing S overall, but up to [place] when navigating in kitchen and apartment for safety. Pt performed kitchen tasks including accessing cabinets, refrigerator and dishwasher. Discussed pt's meal access at home and dish use.   Pt became too fatigued to continue standing and needed assist to sit to Rollator.  Assisted pt to bed and instructed in supine therex for duration including: glute sets, bridging, ankle pumps, heel slides, and SAQ 2x10 each.   Pt left in bed with all needs in reach. Discussed home safety concerns with primary PT.      Therapy Documentation Precautions:  Precautions Precautions: Fall Precaution Comments: Parkinson's Restrictions Weight Bearing Restrictions: No   Pain: none   Locomotion  : Ambulation Ambulation/Gait Assistance: 4: Min assist   See FIM for current functional status  Therapy/Group: Individual Therapy  Kennieth Rad, PT, DPT  12/14/2014, 3:53 PM

## 2014-12-14 NOTE — Progress Notes (Signed)
Social Work Patient ID: [name], female   DOB: 03/31/1941, 73 y.o.   MRN: [id number]   Met with pt this morning to review team conference.  Pt aware and agreeable with targeted d/c date of 3/26 with mod independent goals.  Reports that her brother, [name], will be the one picking her up. Have also left a message for him with this information.  Contacted [employer]/ [employer] program to schedule a re-start of those services on 3/28.  Will continue to follow for support and d/c planning needs.  , , LCSW  

## 2014-12-14 NOTE — Progress Notes (Signed)
Physical Therapy Session Note  Patient Details  Name: [name] MRN: [id number] Date of Birth: 21-Jul-1941  Today's Date: 12/14/2014 PT Individual Time: 0900-1000 PT Individual Time Calculation (min): 60 min   Short Term Goals: Week 1:  PT Short Term Goal 1 (Week 1): = LTGs due to ELOS  Skilled Therapeutic Interventions/Progress Updates:   Patient received sitting in recliner, requesting to brush teeth. Session focused on sit <> stand, ambulation, safety, activity tolerance, and standing balance. Patient ambulated around room using rollator and stood at sink to brush teeth with distant supervision. Patient reported need for bathroom, performed toilet transfer and toileting tasks with distant supervision. Patient required 2 verbal cues for safety to place objects on rollator instead of carrying in hand while using rollator. Gait training room <> gym 2 x 150 ft with supervision, mod cues for locking brakes and hand placement with stand > sit during session. 10 MWT using rollator = 0.67 m/s. Patient performed sit <> stand without UE support from mat x 10 with focus on anterior weight shift and not pushing back of BLE against mat to come to standing. Patient ambulated to ADL kitchen and completed basic cooking task of preparing jello with focus on dynamic standing balance with reaching for objects from cabinets, endurance, problem solving, and safe use of rollator in kitchen. Patient required mod verbal cues for safety with rollator to place items on seat and not carry items in hand as well as keeping both hands on rollator when ambulating instead of one hand on counter and one hand on rollator. Patient also needed cuing to safely load dirty dishes in dishwasher as she stood directly in front of dishwasher with door resting on her instead of standing to side. Patient required one seated rest break during kitchen task. Ambulated using rollator from ADL apartment to nurses station to retrieve [place] ice  before returning to room with supervision. Patient left sitting in recliner with all needs within reach, safety plan updated to d/c quick release belt.   Therapy Documentation Precautions:  Precautions Precautions: Fall Precaution Comments: Parkinson's Restrictions Weight Bearing Restrictions: No Pain: Pain Assessment Pain Assessment: 0-10 Pain Score: 6  Pain Type: Acute pain Pain Location: Elbow Pain Orientation: Left Pain Onset: On-going Pain Intervention(s): Emotional support  See FIM for current functional status  Therapy/Group: Individual Therapy  Laretta Alstrom 12/14/2014, 10:58 AM

## 2014-12-14 NOTE — Progress Notes (Signed)
[place] PHYSICAL MEDICINE & REHABILITATION     PROGRESS NOTE    Subjective/Complaints: Feeling well. This am   Objective: Vital Signs: Blood pressure 144/62, pulse 64, temperature 98.2 F (36.8 C), temperature source Oral, resp. rate 17, SpO2 95 %. No results found. No results for input(s): WBC, HGB, HCT, PLT in the last 72 hours. No results for input(s): NA, K, CL, GLUCOSE, BUN, CREATININE, CALCIUM in the last 72 hours.  Invalid input(s): CO CBG (last 3)  No results for input(s): GLUCAP in the last 72 hours.  Wt Readings from Last 3 Encounters:  08/25/14 79.289 kg (174 lb 12.8 oz)  07/21/14 80.74 kg (178 lb)  04/18/14 79.833 kg (176 lb)    Physical Exam:  HENT:  Head: Normocephalic.  Eyes: EOM are normal.  Neck: Normal range of motion. Neck supple. No thyromegaly present.  Cardiovascular: Normal rate and regular rhythm.  Respiratory: Effort normal and breath sounds normal. No respiratory distress.  GI: Soft. Bowel sounds are normal. She exhibits no distension.  Neurological: She is alert.    Speech is a bit dysarthric but intelligible. She easily becomes anxious during exam. Follows simple commands. She can provide her name and age as well as date of birth. Continued pill rolling tremor bilateral UE. Persistent essential tremor in both legs. Speech halting but intelligible. Reasonable insight and awareness.   Skin:  Erythema over anterior shin lower extremities improving. Palpable pedal pulses. Left leg less tender. No obvious skin breakdown  M/S: left olecranon bursa swollen and non tender  Assessment/Plan: 1. Functional deficits secondary to pseudo-exacerbation of PD which require 3+ hours per day of interdisciplinary therapy in a comprehensive inpatient rehab setting. Physiatrist is providing close team supervision and 24 hour management of active medical problems listed below. Physiatrist and rehab team continue to assess barriers to discharge/monitor  patient progress toward functional and medical goals. FIM: FIM - Bathing Bathing Steps Patient Completed: Chest, Right Arm, Front perineal area, Left Arm, Right upper leg, Left upper leg, Abdomen, Right lower leg (including foot), Left lower leg (including foot) Bathing: 4: Steadying assist  FIM - Upper Body Dressing/Undressing Upper body dressing/undressing steps patient completed: Thread/unthread right sleeve of pullover shirt/dresss, Thread/unthread left sleeve of pullover shirt/dress, Put head through opening of pull over shirt/dress, Pull shirt over trunk, Thread/unthread right bra strap, Thread/unthread left bra strap, Hook/unhook bra Upper body dressing/undressing: 5: Supervision: Safety issues/verbal cues FIM - Lower Body Dressing/Undressing Lower body dressing/undressing steps patient completed: Thread/unthread right underwear leg, Thread/unthread left underwear leg, Pull underwear up/down, Thread/unthread right pants leg, Thread/unthread left pants leg, Pull pants up/down, Don/Doff right sock, Don/Doff left sock, Don/Doff right shoe, Don/Doff left shoe Lower body dressing/undressing: 5: Supervision: Safety issues/verbal cues  FIM - Toileting Toileting steps completed by patient: Performs perineal hygiene, Adjust clothing prior to toileting Toileting Assistive Devices: Grab bar or rail for support Toileting: 3: Mod-Patient completed 2 of 3 steps  FIM - Radio producer Devices: Grab bars, Insurance account manager Transfers: 5-To toilet/BSC: Supervision (verbal cues/safety issues), 5-From toilet/BSC: Supervision (verbal cues/safety issues)  FIM - Control and instrumentation engineer Devices: Arm rests Bed/Chair Transfer: 5: Bed > Chair or W/C: Supervision (verbal cues/safety issues), 5: Chair or W/C > Bed: Supervision (verbal cues/safety issues)  FIM - Locomotion: Wheelchair Distance: 60 Locomotion: Wheelchair: 0: Activity did not occur FIM -  Locomotion: Ambulation Locomotion: Ambulation Assistive Devices: Other (comment) (rollator) Ambulation/Gait Assistance: 5: Supervision Locomotion: Ambulation: 5: Travels 150 ft or more with supervision/safety  issues  Comprehension Comprehension Mode: Auditory Comprehension: 5-Follows basic conversation/direction: With no assist  Expression Expression Mode: Verbal Expression: 5-Expresses basic needs/ideas: With no assist  Social Interaction Social Interaction: 5-Interacts appropriately 90% of the time - Needs monitoring or encouragement for participation or interaction.  Problem Solving Problem Solving: 5-Solves basic problems: With no assist  Memory Memory: 5-Recognizes or recalls 90% of the time/requires cueing < 10% of the time  Medical Problem List and Plan: 1. Functional deficits secondary to pseudo-exacerbation of Parkinson's disease related to bilateral lower extremity cellulitis. Cipro as directed for cellulitis 7 days and stop 2. DVT Prophylaxis/Anticoagulation: SCDs. Monitor for any signs of DVT. Check vascular study today 3. Pain Management: Oxycodone as needed. Monitor with increased mobility  -ice/analgesics to left elbow. Pad as needed for comfort 4. Mood/anxiety. Ativan 0.5 mg as needed. 5. Neuropsych: This patient is capable of making decisions on her own behalf. 6. Skin/Wound Care: Routine skin checks 7. Fluids/Electrolytes/Nutrition: Strict I and O's with follow-up chemistries 8. Parkinson's disease. Continue Mirapex 1.5 mg 3 times a day as well as Sinemet 4 times a day  -symptoms wax and wane per patient  LOS (Days) 6 A FACE TO FACE EVALUATION WAS PERFORMED  SWARTZ,ZACHARY T 12/14/2014 8:41 AM

## 2014-12-15 ENCOUNTER — Inpatient Hospital Stay (HOSPITAL_COMMUNITY): Payer: Medicare Other | Admitting: Physical Therapy

## 2014-12-15 ENCOUNTER — Inpatient Hospital Stay (HOSPITAL_COMMUNITY): Payer: Medicare Other

## 2014-12-15 MED ORDER — SENNOSIDES-DOCUSATE SODIUM 8.6-50 MG PO TABS
1.0000 | ORAL_TABLET | Freq: Two times a day (BID) | ORAL | Status: DC
  Administered 2014-12-15 – 2014-12-17 (×4): 1 via ORAL
  Filled 2014-12-15 (×4): qty 1

## 2014-12-15 NOTE — Progress Notes (Signed)
Occupational Therapy Note  Patient Details  Name: [name] MRN: [id number] Date of Birth: 1941/09/11  Today's Date: 12/15/2014 OT Individual Time: 1000-1035 OT Individual Time Calculation (min): 35 min  and Today's Date: 12/15/2014 OT Missed Time: 25 Minutes Missed Time Reason: Patient fatigue  Pt initially denied pain but c/o increase pain in Lt elbow after laying in bed; ice applied Individual therapy  Pt sitting in chair upon arrival.  Per pt and RN, pt having a "tremor" episode as noted by increased resting tremors.  Pt states she is unable to move voluntarily during these episodes (verified by RN). Pt states she usually has at least one such episode each day when at home.  Pt states that when these episodes occur at home she remains seated until such time she can walk again.  Pt states she has no one who can physically help her at home.  Discussed the possibility of arranging a network of people whom she can call during these episodes.  Pt continues to state there is no one.  Pt able to amb with Rollator to bed after 35 mins of tremors.  Pt states she becomes fatigued after episodes and must rest.  Focus on discharge planning, sitting balance, safety awareness, bed mobility, and functional amb with Rollator.   Leotis Shames [facility] 12/15/2014, 10:43 AM

## 2014-12-15 NOTE — Progress Notes (Signed)
Pt has been 2+ max assist 3X with staff today. Total care with transfers and turning in bed. Therapy notified with level of care needed multiple times throughout this week. Tremors kick in and pt becomes stiff and unable to move.

## 2014-12-15 NOTE — Progress Notes (Signed)
Physical Therapy Session Note  Patient Details  Name: [name] MRN: [id number] Date of Birth: 01/11/1941  Today's Date: 12/15/2014 PT Individual Time: 1300-1400 PT Individual Time Calculation (min): 60 min   Short Term Goals: Week 1:  PT Short Term Goal 1 (Week 1): = LTGs due to ELOS  Skilled Therapeutic Interventions/Progress Updates:    Gait Training: PT instructs pt in ambulation with 4WW x 200' x 2 req SBA progressing to mod I - no LOB noted and no freezing episodes noted.  PT instructs pt in ascending/descending a ramp with 4WW req SBA progressing to mod I - no LOB and no freezing episodes noted.   Neuromuscular Reeducation: PT instructs pt in 5 time sit to stand using armrests of w/c and pt scores 31 seconds, but timing stopped when pt sat after 5th stand.   Therapeutic Activity: PT discusses anti-freezing strategies with pt to put into use when ambulating, including: rocking side to side while counting to 3 and then stepping on 3, visualizing a hole or object that must be stepped over, listening to music with a strong beat or singing a well known simple song with a strong beat. PT also discusses sit to stand strategies from low, compliant surfaces, such as rocking while counting to 3 and then standing.  Pt demonstrates sit to stand x 2 reps from low, compliant love seat with R armrest to 4WW mod I on first rep and mod I with increased time req 2 attempts on second rep.  Pt demonstrates mod I bed mobility with use of momentum and bedrail getting legs onto bed in transitional apartment.   Therapeutic Exercise: PT instructs pt in 1 ROM and 1 strengthening exercise in supine: bilateral knees side to side: 30 seconds x 3 reps for trunk rotation ROM and supine march with knees in B hook lie x 20 reps. Pt fatigued after this exercise.  Pt reports she participates in a tv-based exercise program called Fit while you Sit that comes on at 5am. Pt describes simple seated leg and arm  exercises. PT added 2 supine exercises to pt's program for trunk ROM and abdominal strengthening. Pt will be ready for grad day, tomorrow. Continue per PT POC.   Therapy Documentation Precautions:  Precautions Precautions: Fall Precaution Comments: Parkinson's Restrictions Weight Bearing Restrictions: No Pain: Pain Assessment Pain Assessment: No/denies pain  See FIM for current functional status  Therapy/Group: Individual Therapy  Anzley Dibbern M 12/15/2014, 1:05 PM

## 2014-12-15 NOTE — Progress Notes (Signed)
Occupational Therapy Session Note  Patient Details  Name: [name] MRN: [id number] Date of Birth: Jan 27, 1941  Today's Date: 12/15/2014 OT Individual Time: 0800-0900 OT Individual Time Calculation (min): 60 min    Short Term Goals: Week 1:  OT Short Term Goal 1 (Week 1): Pt will complete toilet transfer at supervision level OT Short Term Goal 2 (Week 1): Pt will initiate 2 rest breaks during ADL task with mod cues OT Short Term Goal 3 (Week 1): Pt will stand for 2 min during functional activity at supervision level for balance OT Short Term Goal 4 (Week 1): Pt will complete LB dressing at supervision level  Skilled Therapeutic Interventions/Progress Updates:    Pt resting in bed upon arrival and agreeable to BADL retraining.  Pt amb with Rollator to gather clothing prior to amb into bathroom for bathing at shower level. Pt returned to w/c after shower to complete dressing with sit<>stand from w/c.  Pt completed tasks at supervision level with extra time for all tasks.  Pt completed grooming tasks (brushing teeth and combing hair) while standing at sink.  Pt amb with Rollator to return radio/CD player to Day Room.  Focus on activity tolerance, functional amb with Rollator for home mgmt tasks, sit<>stand, standing balance, and safety awareness.  Therapy Documentation Precautions:  Precautions Precautions: Fall Precaution Comments: Parkinson's Restrictions Weight Bearing Restrictions: No General OT Amount of Missed Time: 25 Minutes Pain: Pain Assessment Pain Assessment: No/denies painPt denies pain  See FIM for current functional status  Therapy/Group: Individual Therapy  Leroy Libman 12/15/2014, 10:53 AM

## 2014-12-15 NOTE — Progress Notes (Addendum)
[place] PHYSICAL MEDICINE & REHABILITATION     PROGRESS NOTE    Subjective/Complaints: Very appreciative of rehab efforts  Objective: Vital Signs: Blood pressure 128/64, pulse 91, temperature 98.1 F (36.7 C), temperature source Oral, resp. rate 18, weight 76.658 kg (169 lb), SpO2 100 %. No results found. No results for input(s): WBC, HGB, HCT, PLT in the last 72 hours. No results for input(s): NA, K, CL, GLUCOSE, BUN, CREATININE, CALCIUM in the last 72 hours.  Invalid input(s): CO CBG (last 3)  No results for input(s): GLUCAP in the last 72 hours.  Wt Readings from Last 3 Encounters:  12/14/14 76.658 kg (169 lb)  08/25/14 79.289 kg (174 lb 12.8 oz)  07/21/14 80.74 kg (178 lb)    Physical Exam:  HENT:  Head: Normocephalic.  Eyes: EOM are normal.  Neck: Normal range of motion. Neck supple. No thyromegaly present.  Cardiovascular: Normal rate and regular rhythm.  Respiratory: Effort normal and breath sounds normal. No respiratory distress.  GI: Soft. Bowel sounds are normal. She exhibits no distension.  Neurological: She is alert.    Speech is a bit dysarthric but intelligible. She easily becomes anxious during exam. Follows simple commands. She can provide her name and age as well as date of birth. Continued pill rolling tremor bilateral UE. Persistent essential tremor in both legs. Speech halting but intelligible. Reasonable insight and awareness.   Skin:  Erythema over anterior shin lower extremities improving. Palpable pedal pulses. Left leg less tender. No obvious skin breakdown  M/S: left olecranon bursa swollen and non tender  Assessment/Plan: 1. Functional deficits secondary to pseudo-exacerbation of PD which require 3+ hours per day of interdisciplinary therapy in a comprehensive inpatient rehab setting. Physiatrist is providing close team supervision and 24 hour management of active medical problems listed below. Physiatrist and rehab team continue to  assess barriers to discharge/monitor patient progress toward functional and medical goals. FIM: FIM - Bathing Bathing Steps Patient Completed: Chest, Right Arm, Front perineal area, Left Arm, Right upper leg, Left upper leg, Abdomen, Right lower leg (including foot), Left lower leg (including foot) Bathing: 4: Steadying assist  FIM - Upper Body Dressing/Undressing Upper body dressing/undressing steps patient completed: Thread/unthread right sleeve of pullover shirt/dresss, Thread/unthread left sleeve of pullover shirt/dress, Put head through opening of pull over shirt/dress, Pull shirt over trunk, Thread/unthread right bra strap, Thread/unthread left bra strap, Hook/unhook bra Upper body dressing/undressing: 5: Supervision: Safety issues/verbal cues FIM - Lower Body Dressing/Undressing Lower body dressing/undressing steps patient completed: Thread/unthread right underwear leg, Thread/unthread left underwear leg, Pull underwear up/down, Thread/unthread right pants leg, Thread/unthread left pants leg, Pull pants up/down, Don/Doff right sock, Don/Doff left sock, Don/Doff right shoe, Don/Doff left shoe Lower body dressing/undressing: 5: Supervision: Safety issues/verbal cues  FIM - Toileting Toileting steps completed by patient: Adjust clothing prior to toileting, Adjust clothing after toileting, Performs perineal hygiene Toileting Assistive Devices: Grab bar or rail for support Toileting: 5: Supervision: Safety issues/verbal cues  FIM - Radio producer Devices: Grab bars, Insurance account manager Transfers: 5-To toilet/BSC: Supervision (verbal cues/safety issues), 5-From toilet/BSC: Supervision (verbal cues/safety issues)  FIM - Control and instrumentation engineer Devices: Arm rests, Copy: 5: Sit > Supine: Supervision (verbal cues/safety issues), 5: Chair or W/C > Bed: Supervision (verbal cues/safety issues), 2: Bed > Chair or W/C: Max A (lift  and lower assist)  FIM - Locomotion: Wheelchair Distance: 60 Locomotion: Wheelchair: 1: Travels less than 50 ft with minimal assistance (Pt.>75%) FIM -  Locomotion: Ambulation Locomotion: Ambulation Assistive Devices: Other (comment) Agricultural consultant) Ambulation/Gait Assistance: 4: Min assist Locomotion: Ambulation: 4: Travels 150 ft or more with minimal assistance (Pt.>75%)  Comprehension Comprehension Mode: Auditory Comprehension: 5-Follows basic conversation/direction: With no assist  Expression Expression Mode: Verbal Expression: 5-Expresses basic needs/ideas: With no assist  Social Interaction Social Interaction: 5-Interacts appropriately 90% of the time - Needs monitoring or encouragement for participation or interaction.  Problem Solving Problem Solving: 5-Solves basic problems: With no assist  Memory Memory: 5-Recognizes or recalls 90% of the time/requires cueing < 10% of the time  Medical Problem List and Plan: 1. Functional deficits secondary to pseudo-exacerbation of Parkinson's disease related to bilateral lower extremity cellulitis. Cipro as directed for cellulitis 7 days and stop 2. DVT Prophylaxis/Anticoagulation: SCDs. Monitor for any signs of DVT. Check vascular study today 3. Pain Management: Oxycodone as needed. Monitor with increased mobility  -ice/analgesics to left elbow. Pad as needed for comfort 4. Mood/anxiety. Ativan 0.5 mg as needed. 5. Neuropsych: This patient is capable of making decisions on her own behalf. 6. Skin/Wound Care: Routine skin checks 7. Fluids/Electrolytes/Nutrition: Strict I and O's with follow-up chemistries 8. Parkinson's disease. Continue Mirapex 1.5 mg 3 times a day as well as Sinemet 4 times a day  -some bradykinesias yesterday LOS (Days) 7 A FACE TO FACE EVALUATION WAS PERFORMED  SWARTZ,ZACHARY T 12/15/2014 8:35 AM

## 2014-12-16 ENCOUNTER — Inpatient Hospital Stay (HOSPITAL_COMMUNITY): Payer: Medicare Other | Admitting: Physical Therapy

## 2014-12-16 ENCOUNTER — Inpatient Hospital Stay (HOSPITAL_COMMUNITY): Payer: Medicare Other

## 2014-12-16 MED ORDER — CARBIDOPA-LEVODOPA 25-100 MG PO TABS: ORAL_TABLET | ORAL | Status: DC

## 2014-12-16 MED ORDER — PRAMIPEXOLE DIHYDROCHLORIDE 1.5 MG PO TABS: 1.5000 mg | ORAL_TABLET | Freq: Three times a day (TID) | ORAL | Status: DC

## 2014-12-16 MED ORDER — OXYCODONE HCL 5 MG PO TABS: 5.0000 mg | ORAL_TABLET | ORAL | Status: DC | PRN

## 2014-12-16 MED ORDER — SENNOSIDES-DOCUSATE SODIUM 8.6-50 MG PO TABS: 1.0000 | ORAL_TABLET | Freq: Two times a day (BID) | ORAL | Status: DC

## 2014-12-16 MED ORDER — LORAZEPAM 0.5 MG PO TABS: 0.5000 mg | ORAL_TABLET | Freq: Two times a day (BID) | ORAL | Status: DC | PRN

## 2014-12-16 MED ORDER — IRON 325 (65 FE) MG PO TABS: 1.0000 | ORAL_TABLET | Freq: Every day | ORAL | Status: DC

## 2014-12-16 NOTE — Progress Notes (Signed)
[place] PHYSICAL MEDICINE & REHABILITATION     PROGRESS NOTE    Subjective/Complaints: Doing well. Excited to be going home  Objective: Vital Signs: Blood pressure 121/63, pulse 75, temperature 98 F (36.7 C), temperature source Oral, resp. rate 16, weight 76.658 kg (169 lb), SpO2 95 %. No results found. No results for input(s): WBC, HGB, HCT, PLT in the last 72 hours. No results for input(s): NA, K, CL, GLUCOSE, BUN, CREATININE, CALCIUM in the last 72 hours.  Invalid input(s): CO CBG (last 3)  No results for input(s): GLUCAP in the last 72 hours.  Wt Readings from Last 3 Encounters:  12/14/14 76.658 kg (169 lb)  08/25/14 79.289 kg (174 lb 12.8 oz)  07/21/14 80.74 kg (178 lb)    Physical Exam:  HENT:  Head: Normocephalic.  Eyes: EOM are normal.  Neck: Normal range of motion. Neck supple. No thyromegaly present.  Cardiovascular: Normal rate and regular rhythm.  Respiratory: Effort normal and breath sounds normal. No respiratory distress.  GI: Soft. Bowel sounds are normal. She exhibits no distension.  Neurological: She is alert.    Speech is   intelligible. Continued pill rolling tremor bilateral UE. Persistent essential tremor in both legs. Speech halting but intelligible. Reasonable insight and awareness.   Skin:  Erythema over anterior shin lower extremities improving. Palpable pedal pulses. Left leg less tender. No obvious skin breakdown  M/S: left olecranon bursa swollen and non tender  Assessment/Plan: 1. Functional deficits secondary to pseudo-exacerbation of PD which require 3+ hours per day of interdisciplinary therapy in a comprehensive inpatient rehab setting. Physiatrist is providing close team supervision and 24 hour management of active medical problems listed below. Physiatrist and rehab team continue to assess barriers to discharge/monitor patient progress toward functional and medical goals.  Home tomorrow after md rounds  FIM: FIM -  Bathing Bathing Steps Patient Completed: Chest, Right Arm, Front perineal area, Left Arm, Right upper leg, Left upper leg, Abdomen, Right lower leg (including foot), Left lower leg (including foot), Buttocks Bathing: 5: Supervision: Safety issues/verbal cues  FIM - Upper Body Dressing/Undressing Upper body dressing/undressing steps patient completed: Thread/unthread right sleeve of pullover shirt/dresss, Thread/unthread left sleeve of pullover shirt/dress, Put head through opening of pull over shirt/dress, Pull shirt over trunk, Thread/unthread right bra strap, Thread/unthread left bra strap, Hook/unhook bra Upper body dressing/undressing: 7: Complete Independence: No helper FIM - Lower Body Dressing/Undressing Lower body dressing/undressing steps patient completed: Thread/unthread right underwear leg, Thread/unthread left underwear leg, Pull underwear up/down, Thread/unthread right pants leg, Thread/unthread left pants leg, Pull pants up/down, Don/Doff right sock, Don/Doff left sock, Don/Doff right shoe, Don/Doff left shoe Lower body dressing/undressing: 5: Supervision: Safety issues/verbal cues  FIM - Toileting Toileting steps completed by patient: Adjust clothing prior to toileting, Adjust clothing after toileting, Performs perineal hygiene Toileting Assistive Devices: Grab bar or rail for support Toileting: 1: Two helpers (twice today due to pt unable to physically move)  FIM - Radio producer Devices: Elevated toilet seat, Bedside commode Toilet Transfers: 1-Two helpers  FIM - Control and instrumentation engineer Devices: Arm rests Bed/Chair Transfer: 1: Two helpers  FIM - Locomotion: Wheelchair Distance: 60 Locomotion: Wheelchair: 0: Activity did not occur FIM - Locomotion: Ambulation Locomotion: Ambulation Assistive Devices: Other (comment) (1OX) Ambulation/Gait Assistance: 6: Modified independent (Device/Increase time) Locomotion:  Ambulation: 6: Travels 150 ft or more with assistive device/no helper  Comprehension Comprehension Mode: Auditory Comprehension: 5-Follows basic conversation/direction: With extra time/assistive device  Expression Expression Mode: Verbal  Expression: 5-Expresses basic needs/ideas: With no assist  Social Interaction Social Interaction: 5-Interacts appropriately 90% of the time - Needs monitoring or encouragement for participation or interaction.  Problem Solving Problem Solving: 5-Solves basic problems: With no assist  Memory Memory: 5-Recognizes or recalls 90% of the time/requires cueing < 10% of the time  Medical Problem List and Plan: 1. Functional deficits secondary to pseudo-exacerbation of Parkinson's disease related to bilateral lower extremity cellulitis. Cipro as directed for cellulitis 7 days and stop 2. DVT Prophylaxis/Anticoagulation: SCDs. Monitor for any signs of DVT. Check vascular study today 3. Pain Management: Oxycodone as needed. Monitor with increased mobility  -ice/analgesics to left elbow. Pad as needed for comfort 4. Mood/anxiety. Ativan 0.5 mg as needed. 5. Neuropsych: This patient is capable of making decisions on her own behalf. 6. Skin/Wound Care: Routine skin checks 7. Fluids/Electrolytes/Nutrition: Strict I and O's with follow-up chemistries 8. Parkinson's disease. Continue Mirapex 1.5 mg 3 times a day as well as Sinemet 4 times a day  -some bradykinesias yesterday LOS (Days) 8 A FACE TO FACE EVALUATION WAS PERFORMED  SWARTZ,ZACHARY T 12/16/2014 9:32 AM

## 2014-12-16 NOTE — Plan of Care (Signed)
Problem: RH SAFETY Goal: RH STG ADHERE TO SAFETY PRECAUTIONS W/ASSISTANCE/DEVICE STG Adhere to Safety Precautions With mod. Assistance/Device.  Outcome: Completed/Met Date Met:  12/16/14 Pt Mod I

## 2014-12-16 NOTE — Progress Notes (Signed)
Physical Therapy Discharge Summary  Patient Details  Name: [name] MRN: [id number] Date of Birth: 25-Jul-1941  Today's Date: 12/16/2014 PT Individual Time: 1400-1500 PT Individual Time Calculation (min): 60 min    Patient has met 9 of 9 long term goals due to improved activity tolerance, improved balance, ability to compensate for deficits and improved coordination.  Patient to discharge at an ambulatory level Modified Independent.   Patient lives alone.   Recommendation:  Patient will benefit from ongoing skilled PT services in home health setting to continue to advance safe functional mobility, address ongoing impairments in activity tolerance, postural dysfunction, balance with LRAD, and minimize fall risk.  Equipment: No equipment provided  Reasons for discharge: treatment goals met and discharge from hospital  Patient/family agrees with progress made and goals achieved: Yes  Therapeutic Intervention Gait Training: Pt demonstrates mod I ambulation in controlled and home environment with 4WW for > 200' x 2 reps. Pt req supervision for safety ascending/descending 12 steps with B rails.   Therapeutic Activity: Pt demonstrates mod I bed mobility during sit to supine and supine to sit with L rail. Pt is able to sit to stand from w/c, standard chair, and low/soft loveseat mod I with [id number].  Pt demonstrates mod I car transfer with [id number].   Pt continues to demonstrate limited activity tolerance, req multiple rest breaks throughout session. Pt will benefit from HHPT follow up at d/c.    PT Discharge Precautions/Restrictions Precautions Precautions: Fall Precaution Comments: Parkinson's Restrictions Weight Bearing Restrictions: No Vital Signs Therapy Vitals Pulse Rate: 80 BP: (!) 105/48 mmHg Patient Position (if appropriate): Sitting Oxygen Therapy SpO2: 95 % O2 Device: Not Delivered Pain Pain Assessment Pain Assessment: 0-10 Pain Score: 4  Pain Type: Acute pain Pain  Location: Elbow Pain Orientation: Left Pain Descriptors / Indicators: Aching Pain Onset: On-going (for a few weeks) Pain Intervention(s): Other (Comment);Emotional support (nurse applied an allevyn patch) Multiple Pain Sites: Yes 2nd Pain Site Pain Score: 4 Pain Type: Chronic pain Pain Location: Neck Pain Orientation: Right;Posterior Pain Descriptors / Indicators: Aching;Radiating (to shoulders) Pain Onset: On-going Pain Intervention(s): Repositioned (to midline posture and instructed to lean back to rest head into soft loveseat cushion) Vision/Perception  Vision - Assessment Eye Alignment: Within Functional Limits Ocular Range of Motion: Within Functional Limits Alignment/Gaze Preference: Within Defined Limits  Cognition Overall Cognitive Status: Within Functional Limits for tasks assessed Arousal/Alertness: Awake/alert Orientation Level: Oriented X4 Attention: Sustained Focused Attention: Appears intact Sustained Attention: Appears intact Memory: Impaired Memory Impairment: Decreased recall of new information;Decreased short term memory Decreased Short Term Memory: Verbal basic;Functional basic Awareness: Impaired Awareness Impairment: Emergent impairment;Anticipatory impairment Problem Solving Impairment: Verbal basic;Functional basic Safety/Judgment: Impaired Sensation Sensation Light Touch: Impaired Detail Light Touch Impaired Details: Impaired LLE;Impaired RLE Stereognosis: Not tested Hot/Cold: Appears Intact Proprioception: Impaired Detail Proprioception Impaired Details: Impaired LLE;Impaired RLE Coordination Gross Motor Movements are Fluid and Coordinated: No Fine Motor Movements are Fluid and Coordinated: No Coordination and Movement Description: tremors, writhing movements due to parkinson's Finger Nose Finger Test: tremors, writhing movements due to parkinson's Heel Shin Test: undershooting Motor  Motor Motor: Abnormal tone;Abnormal postural alignment and  control Motor - Discharge Observations: tremors and dyskinesias due to parkinsons; pt leans R in supine/sit/stand  Mobility Bed Mobility Bed Mobility: Supine to Sit;Sit to Supine Supine to Sit: 6: Modified independent (Device/Increase time);HOB flat;With rails Sit to Supine: 6: Modified independent (Device/Increase time);HOB flat Transfers Transfers: Yes Sit to Stand: 6: Modified independent (Device/Increase time) Stand to Sit:  6: Modified independent (Device/Increase time) Stand Pivot Transfers: 6: Modified independent (Device/Increase time) Locomotion  Ambulation Ambulation: Yes Ambulation/Gait Assistance: 6: Modified independent (Device/Increase time) Ambulation Distance (Feet): 200 Feet Assistive device: 4-wheeled walker Gait Gait: Yes Gait Pattern: Impaired Gait Pattern: Step-through pattern;Lateral trunk lean to right Stairs / Additional Locomotion Stairs: Yes Stairs Assistance: 5: Supervision Stairs Assistance Details: Verbal cues for safe use of DME/AE Stairs Assistance Details (indicate cue type and reason): up with R leg, down with left (but pt does step over step, anyway) Stair Management Technique: Two rails Number of Stairs: 12 Height of Stairs: 6 Ramp: 6: Modified independent (Device) Wheelchair Mobility Wheelchair Mobility: No (due to ambulation ability)  Trunk/Postural Assessment  Cervical Assessment Cervical Assessment: Exceptions to [facility] Cervical AROM Overall Cervical AROM: Deficits;Due to premorid status Overall Cervical AROM Comments: pt postured in forward head, R rotation, and R side flexion; limited L rotation and L side flexion compared to other side Thoracic Assessment Thoracic Assessment: Exceptions to [facility] Thoracic AROM Overall Thoracic AROM: Deficits;Due to premorid status Overall Thoracic AROM Comments: kyphosis, rounded shoulders, trunk extenstion to neutral only Lumbar Assessment Lumbar Assessment: Within Functional Limits Postural  Control Postural Control: Deficits on evaluation Righting Reactions: delayed Protective Responses: delayed Postural Limitations: posterior pelvic tilt in sitting, R trunk side flexion in sit  Balance Balance Balance Assessed: Yes Static Sitting Balance Static Sitting - Balance Support: Right upper extremity supported;Feet supported Static Sitting - Level of Assistance: 6: Modified independent (Device/Increase time) Dynamic Sitting Balance Dynamic Sitting - Balance Support: Right upper extremity supported;Feet supported;During functional activity;Left upper extremity supported Dynamic Sitting - Level of Assistance: 6: Modified independent (Device/Increase time) Dynamic Sitting - Balance Activities: Lateral lean/weight shifting;Forward lean/weight shifting Static Standing Balance Static Standing - Balance Support: During functional activity;Bilateral upper extremity supported Static Standing - Level of Assistance: 6: Modified independent (Device/Increase time) Dynamic Standing Balance Dynamic Standing - Balance Support: During functional activity;Bilateral upper extremity supported Dynamic Standing - Level of Assistance: 6: Modified independent (Device/Increase time) Extremity Assessment  RUE Assessment RUE Assessment: Exceptions to [facility] RUE AROM (degrees) Overall AROM Right Upper Extremity: Within functional limits for tasks performed RUE Overall AROM Comments: tremors and writhing movements due to Parkinson's RUE Strength RUE Overall Strength: Deficits;Due to premorbid status RUE Overall Strength Comments: grossly 4/5 RUE Tone RUE Tone: Hypertonic Hypertonic Details: tremors and dyskinesias due to parkinson's LUE Assessment LUE Assessment: Exceptions to WFL LUE AROM (degrees) Overall AROM Left Upper Extremity: Within functional limits for tasks assessed LUE Overall AROM Comments: tremors and writhing movements due to Parkinson's LUE Strength LUE Overall Strength: Deficits;Due to  premorbid status LUE Overall Strength Comments: grossly 4/5 except for triceps which is 3+/5 due to elbow pain LUE Tone LUE Tone: Hypertonic Hypertonic Details: tremors and dyskinesias due to parkinson's RLE Assessment RLE Assessment: Exceptions to [facility] RLE AROM (degrees) Overall AROM Right Lower Extremity: Within functional limits for tasks assessed RLE Strength RLE Overall Strength: Deficits;Due to premorbid status RLE Overall Strength Comments: grossly 4+ to 5/5 througout LLE Assessment LLE Assessment: Exceptions to [facility] LLE AROM (degrees) Overall AROM Left Lower Extremity: Within functional limits for tasks assessed LLE Strength LLE Overall Strength: Deficits;Due to premorbid status LLE Overall Strength Comments: grossly 4/5 throughout  See FIM for current functional status  Ameris Akamine M 12/16/2014, 2:50 PM

## 2014-12-16 NOTE — Discharge Summary (Signed)
[name], [name] NO.:  [id number]  MEDICAL RECORD NO.:  [id number]  LOCATION:  [id number]                        FACILITY:  [facility]  PHYSICIAN:  [name], M.D.DATE OF BIRTH:  [birth year]  DATE OF ADMISSION:  12/08/2014 DATE OF DISCHARGE:  12/17/2014                              DISCHARGE SUMMARY   DISCHARGE DIAGNOSES: 1. Functional deficits secondary to pseudo exacerbation of Parkinson     disease, related to bilateral lower extremity cellulitis. 2. Sequential compression devices for DVT prophylaxis. 3. Pain management. 4. Anxiety. 5. Parkinson disease.  HISTORY OF PRESENT ILLNESS:  This is a 74 year old right-handed female with a history of diastolic congestive heart failure as well as Parkinson disease, well known to Rehab Services from admission in January of 2015 for deconditioning related to Parkinson disease with pseudo exacerbation.  The patient lives alone independent prior to admission with assistive device.  Her brother goes to the grocery store for her.  She receives Meals On Wheels.  Presented on December 07, 2014, with lower extremity pain, left greater than right x2 weeks with associated mild erythema and warmth to touch.  No fever or chills. Preliminary venous Doppler studies negative.  Suspect cellulitis, placed on vancomycin, transitioned to Cipro x7 days.  Remained on Mirapex as well as Sinemet for history of Parkinson disease.  Physical and occupational therapy ongoing.  The patient was admitted for comprehensive rehab program.  PAST MEDICAL HISTORY:  See discharge diagnoses.  SOCIAL HISTORY:  Lives alone.  Independent prior to admission with assistive device.  Functional status upon admission to rehab services was minimal assist to ambulate 130 feet with a rolling walker, minimal assist sit to stand, min to mod assist for activities of daily living.  PHYSICAL EXAMINATION:  VITAL SIGNS:  Blood pressure 122/61, pulse 80, temperature  97, respirations 18. GENERAL:  This was an alert female, appropriate in conversation.  Speech was a bit dysarthric. LUNGS:  Clear to auscultation. CARDIAC:  Regular rate and rhythm. ABDOMEN:  Soft, nontender.  Good bowel sounds.  She had some mild tremors in bilateral upper extremities.  REHABILITATION HOSPITAL COURSE:  The patient was admitted to inpatient rehab services with therapies initiated on a 3-hour daily basis consisting of physical therapy, occupational therapy, and rehabilitation nursing.  The following issues were addressed during the patient's rehabilitation stay.  Pertaining to Ms. Bonsall's pseudo exacerbation of Parkinson disease related to bilateral lower extremity cellulitis remained stable.  She had completed a course of Cipro.  Afebrile.  She would remain on Mirapex as well as Sinemet for history of Parkinson disease and follow up with her neurologist.  Sequential compression devices for DVT prophylaxis.  She did have a history of anxiety using Ativan as needed.  She is participating fully with her therapy program. No bowel or bladder disturbances other than some modest constipation, resolved with laxative assistance.  Chronic anemia, she remained on an iron supplement.  No bleeding episodes noted.  She would follow up with her primary care doctor.  She was using oxycodone on a limited basis for pain for some mild left elbow discomfort.  The patient received weekly collaborative interdisciplinary team conferences to discuss  estimated length of stay, family teaching, and any barriers to discharge.  She was ambulating greater than 200 feet standby assist, modified independence, no loss of balance.  She could navigate up and down a ramp with standby assistance, modified independence.  Activities of daily living and homemaking, she could gather her items for bathing, dressing, and grooming.  Working on issues of energy conservation.  Overall, her strength and endurance  greatly improved throughout her rehab stay and plan was discharged to home with ongoing therapies dictated per rehab services.  DISCHARGE MEDICATIONS:  Sinemet 25-100 mg 1-1/2 tablets q.i.d., ferrous sulfate 325 mg p.o. daily, Ativan 0.5 mg p.o. b.i.d. as needed, multivitamin 1 tablet daily, oxycodone immediate release 5 mg 1 tab every 4 hours as needed for moderate pain dispensed 60 tablets, Mirapex 1.5 mg p.o. t.i.d., Senokot- S 1 tab p.o. b.i.d.  DIET:  Regular.  SPECIAL INSTRUCTIONS:  She would follow up with Dr. Alger Simons at the outpatient rehab service office on January 18, 2015 and Hollace Kinnier, DO, Medical Management.     Lauraine Rinne, P.A.   ______________________________ [name], M.D.    DA/MEDQ  D:  12/16/2014  T:  12/16/2014  Job:  [id number]  cc:   Hollace Kinnier, DO

## 2014-12-16 NOTE — Discharge Instructions (Signed)
Inpatient Rehab Discharge Instructions  [name] Discharge date and time: No discharge date for patient encounter.   Activities/Precautions/ Functional Status: Activity: activity as tolerated Diet: regular diet Wound Care: none needed Functional status:  ___ No restrictions     ___ Walk up steps independently ___ 24/7 supervision/assistance   ___ Walk up steps with assistance ___ Intermittent supervision/assistance  ___ Bathe/dress independently ___ Walk with walker     ___ Bathe/dress with assistance ___ Walk Independently    ___ Shower independently __x_ Walk with assistance    ___ Shower with assistance ___ No alcohol     ___ Return to work/school ________     COMMUNITY REFERRALS UPON DISCHARGE:    Home Health:   PT        SW                    Agency: [facility] Phone: [phone]      Special Instructions:    My questions have been answered and I understand these instructions. I will adhere to these goals and the provided educational materials after my discharge from the hospital.  Patient/Caregiver Signature _______________________________ Date __________  Clinician Signature _______________________________________ Date __________  Please bring this form and your medication list with you to all your follow-up doctor's appointments.

## 2014-12-16 NOTE — Progress Notes (Signed)
Physical Therapy Session Note  Patient Details  Name: [name] MRN: [id number] Date of Birth: 1940-12-16  Today's Date: 12/16/2014 PT Individual Time: 1500-1530 PT Individual Time Calculation (min): 30 min   Short Term Goals: Week 1:  PT Short Term Goal 1 (Week 1): = LTGs due to ELOS  Skilled Therapeutic Interventions/Progress Updates:    Pt seen for makeup session to make up for time missed. Pt received handed off from previous PT, agreeable to participate in therapy. Session focused on functional endurance, ramp negotiation, outcome measures. Pt ambulated around rehab unit on flat level surface, carpeted surface, and thresholds with overall mod (I) w/ use of Rollator walker. Re-assessed Five Time Sit to Stand (5TSS) and Timed Up and Go (TUG). Pt scored 21 seconds on 5TSS (22 seconds on eval) and with best score of 18 seconds on TUG (38 seconds on eval) indicating clinically significant difference for TUG. Pt ambulated up/down ramp w/ Rollator and mod (I). Session ended in pt's room, where pt was left supine in bed w/ all needs within reach.     Therapy Documentation Precautions:  Precautions Precautions: Fall Precaution Comments: Parkinson's Restrictions Weight Bearing Restrictions: No Pain: Pain Assessment Pain Assessment: 0-10 Pain Score: 4  Pain Type: Acute pain Pain Location: Elbow Pain Orientation: Left Pain Descriptors / Indicators: Aching Pain Onset: On-going (for a few weeks) Pain Intervention(s): ice pack on elbow throughout session    See FIM for current functional status  Therapy/Group: Individual Therapy  Rada Hay 12/16/2014, 4:03 PM

## 2014-12-16 NOTE — Progress Notes (Signed)
Social Work  Discharge Note  The overall goal for the admission was met for:   Discharge location: Yes - home with intermittent support from brothers  Length of Stay: Yes - 9 days (with 3/26 discharge)  Discharge activity level: Yes - modified independent  Home/community participation: Yes  Services provided included: MD, RD, PT, OT, SLP, RN, TR, Pharmacy and SW  Financial Services: Medicare and Private Insurance: [employer]  Follow-up services arranged: Home Health: PT, SW via [facility] and Patient/Family has no preference for HH/DME agencies  Comments (or additional information): already had needed DME  Patient/Family verbalized understanding of follow-up arrangements: Yes  Individual responsible for coordination of the follow-up plan: pt  Confirmed correct DME delivered: NA    [name]

## 2014-12-16 NOTE — Discharge Summary (Signed)
Discharge summary job # [id number]

## 2014-12-16 NOTE — Progress Notes (Signed)
Occupational Therapy Discharge Summary  Patient Details  Name: [name] MRN: [id number] Date of Birth: [birth year]   Patient has met 41 of 10 long term goals due to improved activity tolerance, improved balance, postural control, ability to compensate for deficits, improved awareness and improved coordination.  Pt made steady progress with BADLs and laundry tasks during this admission.  Pt continues to fatigue quickly but takes appropriate rest breaks when needed.  Pt is mod I for all BADLs and laundry tasks. No family has been present for therapy sessions. Patient to discharge at overall Modified Independent level.  Patient's care partner not necessary to provide the necessary physical and cognitive assistance at discharge as patient is discharging at modified independent level.     Recommendation:  Patient will benefit from ongoing skilled OT services in home health setting to continue to advance functional skills in the area of BADLs, balance, strength, activity tolerance, and minimize fall risk.  Equipment: No equipment provided Pt owns tub bench and BSC  Reasons for discharge: treatment goals met and discharge from hospital  Patient/family agrees with progress made and goals achieved: Yes  OT Discharge Precautions/Restrictions  Precautions Precautions: Fall Precaution Comments: Parkinson's Restrictions Weight Bearing Restrictions: No Pain Pain Assessment Pain Assessment: 0-10 Pain Score: 4  Pain Type: Acute pain Pain Location: Elbow Pain Orientation: Left Pain Descriptors / Indicators: Aching Pain Onset: On-going (for a few weeks) Pain Intervention(s): Other (Comment);Emotional support (nurse applied an allevyn patch) Multiple Pain Sites: Yes 2nd Pain Site Pain Score: 4 Pain Type: Chronic pain Pain Location: Neck Pain Orientation: Right;Posterior Pain Descriptors / Indicators: Aching;Radiating (to shoulders) Pain Onset: On-going Pain Intervention(s): Repositioned  (to midline posture and instructed to lean back to rest head into soft loveseat cushion) Vision/Perception  Vision- History Baseline Vision/History: Wears glasses Wears Glasses: Distance only Patient Visual Report: No change from baseline Vision- Assessment Vision Assessment?: No apparent visual deficits Eye Alignment: Within Functional Limits Ocular Range of Motion: Within Functional Limits Alignment/Gaze Preference: Within Defined Limits Visual Fields: No apparent deficits  Cognition Overall Cognitive Status: Within Functional Limits for tasks assessed Arousal/Alertness: Awake/alert Orientation Level: Oriented X4 Attention: Sustained Focused Attention: Appears intact Sustained Attention: Appears intact Memory: Impaired Memory Impairment: Decreased recall of new information;Decreased short term memory Decreased Short Term Memory: Verbal basic;Functional basic Awareness: Impaired Awareness Impairment: Emergent impairment;Anticipatory impairment Problem Solving Impairment: Verbal basic;Functional basic Safety/Judgment: Impaired Sensation Sensation Light Touch: Impaired Detail Light Touch Impaired Details: Impaired LLE;Impaired RLE Stereognosis: Not tested Hot/Cold: Appears Intact Proprioception: Impaired Detail Proprioception Impaired Details: Impaired LLE;Impaired RLE Coordination Gross Motor Movements are Fluid and Coordinated: No Fine Motor Movements are Fluid and Coordinated: No Coordination and Movement Description: tremors, writhing movements due to parkinson's Finger Nose Finger Test: tremors, writhing movements due to parkinson's Heel Shin Test: undershooting Motor  Motor Motor: Abnormal tone;Abnormal postural alignment and control Motor - Discharge Observations: tremors and dyskinesias due to parkinsons; pt leans R in supine/sit/stand Mobility  Bed Mobility Bed Mobility: Supine to Sit;Sit to Supine Supine to Sit: 6: Modified independent (Device/Increase  time);HOB flat;With rails Sit to Supine: 6: Modified independent (Device/Increase time);HOB flat Transfers Sit to Stand: 6: Modified independent (Device/Increase time) Stand to Sit: 6: Modified independent (Device/Increase time)  Trunk/Postural Assessment  Cervical Assessment Cervical Assessment: Exceptions to [facility] Cervical AROM Overall Cervical AROM: Deficits;Due to premorid status Overall Cervical AROM Comments: pt postured in forward head, R rotation, and R side flexion; limited L rotation and L side flexion compared to other side Thoracic  Assessment Thoracic Assessment: Exceptions to [facility] Thoracic AROM Overall Thoracic AROM: Deficits;Due to premorid status Overall Thoracic AROM Comments: kyphosis, rounded shoulders, trunk extenstion to neutral only Lumbar Assessment Lumbar Assessment: Within Functional Limits Postural Control Postural Control: Deficits on evaluation Righting Reactions: delayed Protective Responses: delayed Postural Limitations: posterior pelvic tilt in sitting, R trunk side flexion in sit  Balance Balance Balance Assessed: Yes Static Sitting Balance Static Sitting - Balance Support: Right upper extremity supported;Feet supported Static Sitting - Level of Assistance: 6: Modified independent (Device/Increase time) Dynamic Sitting Balance Dynamic Sitting - Balance Support: Right upper extremity supported;Feet supported;During functional activity;Left upper extremity supported Dynamic Sitting - Level of Assistance: 6: Modified independent (Device/Increase time) Dynamic Sitting - Balance Activities: Lateral lean/weight shifting;Forward lean/weight shifting Static Standing Balance Static Standing - Balance Support: During functional activity;Bilateral upper extremity supported Static Standing - Level of Assistance: 6: Modified independent (Device/Increase time) Dynamic Standing Balance Dynamic Standing - Balance Support: During functional activity;Bilateral upper  extremity supported Dynamic Standing - Level of Assistance: 6: Modified independent (Device/Increase time) Extremity/Trunk Assessment RUE Assessment RUE Assessment: Exceptions to [facility] RUE AROM (degrees) Overall AROM Right Upper Extremity: Within functional limits for tasks performed RUE Overall AROM Comments: tremors and writhing movements due to Parkinson's RUE Strength RUE Overall Strength: Deficits;Due to premorbid status RUE Overall Strength Comments: grossly 4/5 RUE Tone RUE Tone: Hypertonic Hypertonic Details: tremors and dyskinesias due to parkinson's LUE Assessment LUE Assessment: Exceptions to WFL LUE AROM (degrees) Overall AROM Left Upper Extremity: Within functional limits for tasks assessed LUE Overall AROM Comments: tremors and writhing movements due to Parkinson's LUE Strength LUE Overall Strength: Deficits;Due to premorbid status LUE Overall Strength Comments: grossly 4/5 except for triceps which is 3+/5 due to elbow pain LUE Tone LUE Tone: Hypertonic Hypertonic Details: tremors and dyskinesias due to parkinson's  See FIM for current functional status  Leroy Libman 12/16/2014, 2:33 PM

## 2014-12-16 NOTE — Progress Notes (Signed)
Occupational Therapy Session Note  Patient Details  Name: [name] MRN: [id number] Date of Birth: 17-Apr-1941  Today's Date: 12/16/2014 OT Individual Time: 1100-1200 OT Individual Time Calculation (min): 60 min    Short Term Goals: Week 1:  OT Short Term Goal 1 (Week 1): Pt will complete toilet transfer at supervision level OT Short Term Goal 2 (Week 1): Pt will initiate 2 rest breaks during ADL task with mod cues OT Short Term Goal 3 (Week 1): Pt will stand for 2 min during functional activity at supervision level for balance OT Short Term Goal 4 (Week 1): Pt will complete LB dressing at supervision level  Skilled Therapeutic Interventions/Progress Updates:    Pt engaged in BADL retraining including bathing at shower level and dressing with sit<>stand from w/c at sink.  Pt amb in room with Rollator to gather clothing and supplies prior to entering bathroom to use toilet and transfer to shower.  Pt completed all bathing and dressing tasks at mod I/I level.  Pt requires multiple, but appropriate, rest breaks throughout session.  Pt exhibited no unsafe behavior during session.  Pt is please with progress and ready for discharge tomorrow.  Therapy Documentation Precautions:  Precautions Precautions: Fall Precaution Comments: Parkinson's Restrictions Weight Bearing Restrictions: No   Pain: Pain Assessment Pain Assessment: No/denies pain  See FIM for current functional status  Therapy/Group: Individual Therapy  Leroy Libman 12/16/2014, 12:08 PM

## 2014-12-16 NOTE — Progress Notes (Signed)
Occupational Therapy Note  Patient Details  Name: [name] MRN: [id number] Date of Birth: Feb 02, 1941  Today's Date: 12/16/2014 OT Individual Time: 1300-1400 OT Individual Time Calculation (min): 60 min   Pt denied pain Individual Therapy  Pt engaged in functional amb with RW for home mgmt tasks and dynamic standing tasks.  Activities included retrieving clothing items from drawers and closets, safely transporting said items, watering plants, hanging items on higher surfaces requring patient to reach outside of BOS.  Pt requires multiple rest breaks throughout session.  Discussed with PT appropriateness of making pt mod I in room.  PT to make pt mod I after PT session.   Leotis Shames [facility] 12/16/2014, 2:24 PM

## 2014-12-17 NOTE — Plan of Care (Signed)
Problem: RH SAFETY Goal: RH STG DECREASED RISK OF FALL WITH ASSISTANCE STG Decreased Risk of Fall With mod. Assistance.  Outcome: Completed/Met Date Met:  12/17/14 Pt Mod I in the room at discharge

## 2014-12-17 NOTE — Progress Notes (Signed)
[name] is a 74 y.o. female 08/06/1941 [id number]  Subjective: No new complaints. No new problems. Slept well. Feeling OK.  Objective: Vital signs in last 24 hours: Temp:  [97.9 F (36.6 C)-98 F (36.7 C)] 97.9 F (36.6 C) (03/26 0539) Pulse Rate:  [71-80] 73 (03/26 0539) Resp:  [18] 18 (03/26 0539) BP: (90-161)/(46-85) 161/85 mmHg (03/26 0539) SpO2:  [95 %-96 %] 96 % (03/26 0539) Weight change:  Last BM Date: 12/16/14  Intake/Output from previous day: 03/25 0701 - 03/26 0700 In: 480 [P.O.:480] Out: -  Last cbgs: CBG (last 3)  No results for input(s): GLUCAP in the last 72 hours.   Physical Exam General: No apparent distress   HEENT: not dry Lungs: Normal effort. Lungs clear to auscultation, no crackles or wheezes. Cardiovascular: Regular rate and rhythm, no edema Abdomen: S/NT/ND; BS(+) Musculoskeletal:  unchanged Neurological: No new neurological deficits. Speech is impaired. Wounds: N/A    Skin: clear  Aging changes Mental state: Alert, oriented, cooperative    Lab Results: BMET    Component Value Date/Time   NA 138 12/09/2014 0709   NA 142 08/25/2014 1607   K 3.9 12/09/2014 0709   CL 104 12/09/2014 0709   CO2 28 12/09/2014 0709   GLUCOSE 112* 12/09/2014 0709   GLUCOSE 90 08/25/2014 1607   BUN 14 12/09/2014 0709   BUN 15 08/25/2014 1607   CREATININE 0.77 12/09/2014 0709   CALCIUM 8.8 12/09/2014 0709   GFRNONAA 81* 12/09/2014 0709   GFRAA >90 12/09/2014 0709   CBC    Component Value Date/Time   WBC 8.3 12/09/2014 0709   WBC 7.3 08/25/2014 1607   RBC 4.68 12/09/2014 0709   RBC 4.19 08/25/2014 1607   HGB 12.1 12/09/2014 0709   HCT 37.8 12/09/2014 0709   PLT 145* 12/09/2014 0709   MCV 80.8 12/09/2014 0709   MCH 25.9* 12/09/2014 0709   MCH 27.0 08/25/2014 1607   MCHC 32.0 12/09/2014 0709   MCHC 32.0 08/25/2014 1607   RDW 17.5* 12/09/2014 0709   RDW 15.8* 08/25/2014 1607   LYMPHSABS 1.0 12/09/2014 0709   LYMPHSABS 1.0 07/21/2014 1420   MONOABS 0.6 12/09/2014 0709   EOSABS 0.4 12/09/2014 0709   EOSABS 0.1 07/21/2014 1420   BASOSABS 0.1 12/09/2014 0709   BASOSABS 0.0 07/21/2014 1420    Studies/Results: No results found.  Medications: I have reviewed the patient's current medications.  Assessment/Plan:  1. Functional deficits secondary to pseudo-exacerbation of Parkinson's disease related to bilateral lower extremity cellulitis. Cipro as directed for cellulitis 7 days and stop 2. DVT Prophylaxis/Anticoagulation: SCDs. Monitor for any signs of DVT. Check vascular study today 3. Pain Management: Oxycodone as needed. Monitor with increased mobility -ice/analgesics to left elbow. Pad as needed for comfort 4. Mood/anxiety. Ativan 0.5 mg as needed. 5. Neuropsych: This patient is capable of making decisions on her own behalf. 6. Skin/Wound Care: Routine skin checks 7. Fluids/Electrolytes/Nutrition: Strict I and O's with follow-up chemistries 8. Parkinson's disease. Continue Mirapex 1.5 mg 3 times a day as well as Sinemet 4 times a day -some bradykinesias yesterday  Will d/c home.  LOS (Days) 8    Length of stay, days: 9  Walker Kehr , MD 12/17/2014, 8:08 AM

## 2014-12-17 NOTE — Progress Notes (Signed)
Pt discharged home with son. Discharge instructions provided by Silvestre Mesi, PA on 12/16/14. However, discharge instructions discussed with son. All questions answered. Pt became visible anxious on discharge. Refused to get out of chair, even though she was Mod I in the room. Extra encouragement and request for Lorezapam given. Pt's son verbalized that 'pt sometimes gets this way.' Pt escorted off unit in w/c by Lavella Lemons, NT. Pt's personal belonging was taken down earlier by her son.

## 2014-12-19 DIAGNOSIS — I10 Essential (primary) hypertension: Secondary | ICD-10-CM | POA: Diagnosis not present

## 2014-12-19 DIAGNOSIS — G20 Parkinson's disease: Secondary | ICD-10-CM | POA: Diagnosis not present

## 2014-12-19 DIAGNOSIS — I5030 Unspecified diastolic (congestive) heart failure: Secondary | ICD-10-CM | POA: Diagnosis not present

## 2014-12-19 DIAGNOSIS — M1990 Unspecified osteoarthritis, unspecified site: Secondary | ICD-10-CM | POA: Diagnosis not present

## 2014-12-19 DIAGNOSIS — D649 Anemia, unspecified: Secondary | ICD-10-CM | POA: Diagnosis not present

## 2014-12-22 ENCOUNTER — Telehealth: Payer: Self-pay | Admitting: Neurology

## 2014-12-22 DIAGNOSIS — G20 Parkinson's disease: Secondary | ICD-10-CM

## 2014-12-22 DIAGNOSIS — D649 Anemia, unspecified: Secondary | ICD-10-CM | POA: Diagnosis not present

## 2014-12-22 DIAGNOSIS — M1990 Unspecified osteoarthritis, unspecified site: Secondary | ICD-10-CM | POA: Diagnosis not present

## 2014-12-22 DIAGNOSIS — I5030 Unspecified diastolic (congestive) heart failure: Secondary | ICD-10-CM | POA: Diagnosis not present

## 2014-12-22 DIAGNOSIS — I10 Essential (primary) hypertension: Secondary | ICD-10-CM | POA: Diagnosis not present

## 2014-12-22 NOTE — Telephone Encounter (Signed)
Left message that Dr. Rexene Alberts is out of office and I will address this with her on Monday. I will call patient to find out when she is taking her medication.

## 2014-12-22 NOTE — Telephone Encounter (Signed)
Darnelle is a PT for home health therapy, pt's medication carbidopa-levodopa (SINEMET IR) 25-100 MG per tablet, She freezes in between taking medication.  She wants to speak with a nurse regarding medication, maybe upping dosage or changing when pt takes medication.  Please call and advise.

## 2014-12-23 ENCOUNTER — Encounter: Payer: Self-pay | Admitting: Internal Medicine

## 2014-12-23 ENCOUNTER — Ambulatory Visit (INDEPENDENT_AMBULATORY_CARE_PROVIDER_SITE_OTHER): Payer: Medicare Other | Admitting: Internal Medicine

## 2014-12-23 VITALS — BP 130/84 | HR 77 | Temp 98.0°F | Resp 20 | Ht 64.0 in | Wt 176.8 lb

## 2014-12-23 DIAGNOSIS — I519 Heart disease, unspecified: Secondary | ICD-10-CM

## 2014-12-23 DIAGNOSIS — I5042 Chronic combined systolic (congestive) and diastolic (congestive) heart failure: Secondary | ICD-10-CM

## 2014-12-23 DIAGNOSIS — I5189 Other ill-defined heart diseases: Secondary | ICD-10-CM

## 2014-12-23 DIAGNOSIS — M545 Low back pain, unspecified: Secondary | ICD-10-CM

## 2014-12-23 DIAGNOSIS — Z23 Encounter for immunization: Secondary | ICD-10-CM | POA: Diagnosis not present

## 2014-12-23 DIAGNOSIS — G20 Parkinson's disease: Secondary | ICD-10-CM

## 2014-12-23 DIAGNOSIS — N281 Cyst of kidney, acquired: Secondary | ICD-10-CM

## 2014-12-23 DIAGNOSIS — K5900 Constipation, unspecified: Secondary | ICD-10-CM | POA: Diagnosis not present

## 2014-12-23 DIAGNOSIS — Q6100 Congenital renal cyst, unspecified: Secondary | ICD-10-CM | POA: Diagnosis not present

## 2014-12-23 DIAGNOSIS — D509 Iron deficiency anemia, unspecified: Secondary | ICD-10-CM | POA: Diagnosis not present

## 2014-12-23 DIAGNOSIS — K5909 Other constipation: Secondary | ICD-10-CM

## 2014-12-23 DIAGNOSIS — G20A1 Parkinson's disease without dyskinesia, without mention of fluctuations: Secondary | ICD-10-CM

## 2014-12-23 NOTE — Telephone Encounter (Signed)
Patient is taking the Sinemet IR at 5 or 6am when she wakes, at 10am, 2pm, and 6pm. Home health states that the freezing usually occurs around 9:30am. What do you recommend?

## 2014-12-23 NOTE — Progress Notes (Signed)
Patient ID: [name], female   DOB: 08-24-1941, 74 y.o.   MRN: [id number]   Location:  [facility]  Code Status: DNR  Allergies  Allergen Reactions  . Codeine Other (See Comments)    unknown  . Penicillins Other (See Comments)    unknown    Chief Complaint  Patient presents with  . Medical Management of Chronic Issues    3 month follow-up,Discuss labs (copy printed)  . Immunizations    Discuss the need for Prevnar 13,Zoster ,TDaP    HPI: Patient is a 74 y.o. white female seen in the office today for med mgt chronic diseases, to review labs and needs prevnar today.    Has had inpatient neuro rehab with Dr. Tessa Lerner.  Was started on lorazepam there for her nerves.  She sees him next 4/27.  She apparently didn't have the right type or enough tremors to participate in the study she was wanting to.  Has had one session of home therapy so far.  Social worker has not been there yet.    Constipation has been a little better recently with stool softeners (I have given these many times before, but she doesn't take consistently on her own).    Lorazepam makes her sleepy.    Legs:  Last time at hospital.  Had gotten very stiff and had terrible pain.  Had cellulitis and UTI.    OT going to see if she can get a high rise toilet, grab bars and a walk-in shower.  "independent living"  Has tried to get many times, but often home care doesn't come.    Couldn't get mri of renal mass Janett Billow ordered due to her tremors so Dr. Tessa Lerner ordered ultrasound and it showed a cyst on the lower pole felt to be benign.  Review of Systems:  Review of Systems  Constitutional: Negative for fever and chills.  HENT: Negative for congestion.   Eyes: Negative for blurred vision.  Respiratory: Negative for shortness of breath and wheezing.   Cardiovascular: Negative for chest pain and leg swelling.       Redness of legs resolved, wearing compression hose    Gastrointestinal: Positive for constipation. Negative for abdominal pain, blood in stool and melena.  Genitourinary: Negative for dysuria.  Musculoskeletal: Positive for myalgias and back pain. Negative for falls.  Skin: Negative for rash.  Neurological: Positive for tremors. Negative for loss of consciousness.  Endo/Heme/Allergies: Bruises/bleeds easily.  Psychiatric/Behavioral: Negative for depression and memory loss. The patient is nervous/anxious.      Past Medical History  Diagnosis Date  . Parkinson's disease   . Hypertension   . Arthritis   . Anemia   . Cellulitis of lower leg 12/24/2011  . Acute bronchitis   . Other abnormal blood chemistry   . Chronic systolic heart failure   . Acute on chronic systolic heart failure   . Cellulitis and abscess of leg, except foot   . Hypotension, unspecified   . Helicobacter pylori (H. pylori)   . Acute gastric ulcer with hemorrhage, without mention of obstruction   . CHF (congestive heart failure)   . Anxiety     Past Surgical History  Procedure Laterality Date  . Hip arthroscopy w/ labral repair    . Thyroidectomy    . Esophagogastroduodenoscopy  09/26/2011    Procedure: ESOPHAGOGASTRODUODENOSCOPY (EGD);  Surgeon: Zenovia Jarred, MD;  Location: [facility] ENDOSCOPY;  Service: Gastroenterology;  Laterality: N/A;  To be done at  bedside.  . Esophagogastroduodenoscopy N/A 09/30/2013    Procedure: ESOPHAGOGASTRODUODENOSCOPY (EGD);  Surgeon: [name], MD;  Location: [place];  Service: Endoscopy;  Laterality: N/A;  . Esophagogastroduodenoscopy N/A 11/25/2013    Procedure: ESOPHAGOGASTRODUODENOSCOPY (EGD);  Surgeon: [name], MD;  Location: [facility] END[facility];  Service: Endoscopy;  Laterality: N/A;    Social History:   reports that she has never smoked. She has never used smokeless tobacco. She reports that she does not drink alcohol or use illicit drugs.  Family History  Problem Relation Age of Onset  . GER disease Mother   . Heart  disease Father     Medications: Patient's Medications  New Prescriptions   No medications on file  Previous Medications   CARBIDOPA-LEVODOPA (SINEMET IR) 25-100 MG PER TABLET    TAKE ONE AND ONE-HALF TABLETS BY MOUTH 4 TIMES DAILY. TAKE AT 6 AM, 10AM, 2PM AND 6PM   FERROUS SULFATE (IRON) 325 (65 FE) MG TABS    Take 1 capsule by mouth daily.   FUROSEMIDE (LASIX) 20 MG TABLET    Take 20 mg by mouth 2 (two) times daily as needed for fluid.    LORAZEPAM (ATIVAN) 0.5 MG TABLET    Take 1 tablet (0.5 mg total) by mouth 2 (two) times daily as needed for anxiety.   MAGNESIUM PO    Take 1 tablet by mouth daily.   MULTIPLE VITAMINS-MINERALS (MULTIVITAMIN WITH MINERALS) TABLET    Take 1 tablet by mouth daily.   OXYCODONE (OXY IR/ROXICODONE) 5 MG IMMEDIATE RELEASE TABLET    Take 1 tablet (5 mg total) by mouth every 4 (four) hours as needed for moderate pain.   PRAMIPEXOLE (MIRAPEX) 1.5 MG TABLET    Take 1 tablet (1.5 mg total) by mouth 3 (three) times daily.   SENNA-DOCUSATE (SENOKOT-S) 8.6-50 MG PER TABLET    Take 1 tablet by mouth 2 (two) times daily.   TDAP (BOOSTRIX) 5-2.5-18.5 LF-MCG/0.5 INJECTION    Inject 0.5 mLs into the muscle once.   ZOSTER VACCINE LIVE, PF, (ZOSTAVAX) 16109 UNT/0.65ML INJECTION    Inject 0.65 mLs into the skin once.  Modified Medications   No medications on file  Discontinued Medications   No medications on file     Physical Exam: Filed Vitals:   12/23/14 1004  BP: 130/84  Pulse: 77  Temp: 98 F (36.7 C)  TempSrc: Oral  Resp: 20  Height: 5\' 4"  (1.626 m)  Weight: 176 lb 12.8 oz (80.196 kg)  SpO2: 98%  Physical Exam  Constitutional: She is oriented to person, place, and time.  Neck: No JVD present.  Cardiovascular: Normal rate, regular rhythm, normal heart sounds and intact distal pulses.   Pulmonary/Chest: Effort normal and breath sounds normal. She has no wheezes. She has no rales.  Abdominal: Soft. Bowel sounds are normal. She exhibits no distension and no  mass. There is no tenderness.  Musculoskeletal:  Walks with rollator walker; stooped posture  Neurological: She is alert and oriented to person, place, and time. She exhibits abnormal muscle tone. Coordination abnormal.  Resting tremor of all 4 extremities  Skin: Skin is warm and dry.  Erythema of lower legs resolved, uses compression hose  Psychiatric: She has a normal mood and affect.    Labs reviewed: Basic Metabolic Panel:  Recent Labs  02/11/14 1202  12/07/14 0750 12/08/14 0514 12/09/14 0709  NA 142  < > 139 139 138  K 4.3  < > 3.5 4.1 3.9  CL 102  < >  107 107 104  CO2 23  < > 26 25 28   GLUCOSE 97  < > 107* 131* 112*  BUN 15  < > 12 14 14   CREATININE 0.77  < > 0.71 0.81 0.77  CALCIUM 9.2  < > 8.4 8.5 8.8  TSH 0.907  --   --   --   --   < > = values in this interval not displayed. Liver Function Tests:  Recent Labs  08/08/14 0600 12/07/14 0750 12/09/14 0709  AST 17 26 31   ALT 8 18 6   ALKPHOS 58 69 67  BILITOT 0.4 1.2 0.6  PROT 5.2* 5.8* 5.7*  ALBUMIN 2.6* 3.3* 3.0*   No results for input(s): LIPASE, AMYLASE in the last 8760 hours. No results for input(s): AMMONIA in the last 8760 hours. CBC:  Recent Labs  12/07/14 0407 12/07/14 0750 12/08/14 0514 12/09/14 0709  WBC 9.0 9.6 7.1 8.3  NEUTROABS 6.8 7.7  --  6.2  HGB 11.6* 11.7* 11.1* 12.1  HCT 36.9 36.7 35.6* 37.8  MCV 80.6 80.3 81.8 80.8  PLT 153 149* 133* 145*   Assessment/Plan 1. Parkinson's disease -cont sinemet and mirapex -cont home PT, OT, use of rollator walker -needs DME as in hpi and recommended during home safety eval -is high fall risk--is now on ativan for anxiety which will contribute to this, but does seem to have helped her nerves  2. Diastolic dysfunction, grade 2 -has been stable recently w/o significant dyspnea, wt stable, no jvd, no rales -cont current med regimen  3. Anemia, iron deficiency -has mild anemia, cont to monitor cbc at visits -cont iron--no more than once per  day due to severe constipation  4. Chronic constipation -she is back on her stool softener senna s which I have ordered many times before but somehow she stops taking after a period of time and winds up impacted -also on magnesium supplement--monitor bmp  5. Chronic combined systolic and diastolic congestive heart failure -cont lasix 20mg  daily and monitor bmp  6. Midline low back pain without sciatica -stable, but is painful if she stands for any length of time like doing dishes--advised that that would be a good time to use the oxycodone--usually only takes for therapy or if she's going out and has to move around a lot  Labs/tests ordered:   Orders Placed This Encounter  Procedures  . Pneumococcal conjugate vaccine 13-valent    Next appt:  3 mos   Ananias Kolander L. Stephaney Steven, D.O. [facility] [address], [place] [address] Cell Phone (Mon-Fri 8am-5pm):  [phone] On Call:  [phone] & follow prompts after 5pm & weekends Office Phone:  [phone] Office Fax:  [phone]

## 2014-12-26 DIAGNOSIS — M1990 Unspecified osteoarthritis, unspecified site: Secondary | ICD-10-CM | POA: Diagnosis not present

## 2014-12-26 DIAGNOSIS — G20 Parkinson's disease: Secondary | ICD-10-CM | POA: Diagnosis not present

## 2014-12-26 DIAGNOSIS — I10 Essential (primary) hypertension: Secondary | ICD-10-CM | POA: Diagnosis not present

## 2014-12-26 DIAGNOSIS — D649 Anemia, unspecified: Secondary | ICD-10-CM | POA: Diagnosis not present

## 2014-12-26 DIAGNOSIS — I5030 Unspecified diastolic (congestive) heart failure: Secondary | ICD-10-CM | POA: Diagnosis not present

## 2014-12-26 NOTE — Telephone Encounter (Signed)
One thing she can try to do is instead of taking Sinemet 1-1/2 pills 4 times a day, we can try 1 pill 6 times a day namely at 6 AM, 9 AM, 12, 3 PM, 6 PM and 9 PM. Please relay to patient.

## 2014-12-27 DIAGNOSIS — G20 Parkinson's disease: Secondary | ICD-10-CM | POA: Diagnosis not present

## 2014-12-27 DIAGNOSIS — M1990 Unspecified osteoarthritis, unspecified site: Secondary | ICD-10-CM | POA: Diagnosis not present

## 2014-12-27 DIAGNOSIS — I5030 Unspecified diastolic (congestive) heart failure: Secondary | ICD-10-CM | POA: Diagnosis not present

## 2014-12-27 DIAGNOSIS — D649 Anemia, unspecified: Secondary | ICD-10-CM | POA: Diagnosis not present

## 2014-12-27 DIAGNOSIS — I10 Essential (primary) hypertension: Secondary | ICD-10-CM | POA: Diagnosis not present

## 2014-12-27 MED ORDER — CARBIDOPA-LEVODOPA 25-100 MG PO TABS: ORAL_TABLET | ORAL | Status: DC

## 2014-12-27 NOTE — Telephone Encounter (Signed)
Darnelle is aware of the medication change. She states that patient reports trouble talking at times and difficulty vocalizing. She is wondering if she can get VO for speech therapy?

## 2014-12-27 NOTE — Addendum Note (Signed)
Addended by: Laurence Spates on: 12/27/2014 10:50 AM   Modules accepted: Orders, Medications

## 2014-12-27 NOTE — Telephone Encounter (Signed)
Spoke to [employer], She is aware of medication change and VO orders for Speech Therapy.

## 2014-12-27 NOTE — Telephone Encounter (Signed)
Ok to give VO for [place] as recommended by PT, thx

## 2014-12-29 ENCOUNTER — Ambulatory Visit: Payer: Self-pay | Admitting: Nurse Practitioner

## 2014-12-29 DIAGNOSIS — G20 Parkinson's disease: Secondary | ICD-10-CM | POA: Diagnosis not present

## 2014-12-29 DIAGNOSIS — M1990 Unspecified osteoarthritis, unspecified site: Secondary | ICD-10-CM | POA: Diagnosis not present

## 2014-12-29 DIAGNOSIS — D649 Anemia, unspecified: Secondary | ICD-10-CM | POA: Diagnosis not present

## 2014-12-29 DIAGNOSIS — I10 Essential (primary) hypertension: Secondary | ICD-10-CM | POA: Diagnosis not present

## 2014-12-29 DIAGNOSIS — I5030 Unspecified diastolic (congestive) heart failure: Secondary | ICD-10-CM | POA: Diagnosis not present

## 2014-12-30 ENCOUNTER — Ambulatory Visit (INDEPENDENT_AMBULATORY_CARE_PROVIDER_SITE_OTHER): Payer: Medicare Other | Admitting: Internal Medicine

## 2014-12-30 ENCOUNTER — Encounter: Payer: Self-pay | Admitting: Internal Medicine

## 2014-12-30 VITALS — BP 130/78 | HR 77 | Temp 97.8°F | Resp 20 | Ht 64.0 in | Wt 173.8 lb

## 2014-12-30 DIAGNOSIS — L03119 Cellulitis of unspecified part of limb: Secondary | ICD-10-CM | POA: Diagnosis not present

## 2014-12-30 DIAGNOSIS — G20 Parkinson's disease: Secondary | ICD-10-CM | POA: Diagnosis not present

## 2014-12-30 DIAGNOSIS — G629 Polyneuropathy, unspecified: Secondary | ICD-10-CM | POA: Diagnosis not present

## 2014-12-30 MED ORDER — CIPROFLOXACIN HCL 500 MG PO TABS
500.0000 mg | ORAL_TABLET | Freq: Two times a day (BID) | ORAL | Status: DC
Start: 2014-12-30 — End: 2015-01-16

## 2014-12-30 NOTE — Progress Notes (Signed)
Patient ID: [name], female   DOB: 09-Feb-1941, 74 y.o.   MRN: [id number]    Facility  [facility]    Place of Service:   OFFICE   Allergies  Allergen Reactions  . Codeine Other (See Comments)    unknown  . Penicillins Other (See Comments)    unknown    Chief Complaint  Patient presents with  . Acute Visit    legs red and burning x 2 weeks    HPI:  74 yo female seen today for leg pain. Hx parkinson's and peripheral neuropathy. She was recently treated for leg cellulitis in the hospital. She is no longer wearing TED stockings. No f/c. She reports burning sensation in b/l legs. No d/c. No insect bites. No known trauma. She does not bathe on a regular basis. Her brother is present and states that she will be getting a walk-in shower soon which will help improve hygiene.  Past Medical History  Diagnosis Date  . Parkinson's disease   . Hypertension   . Arthritis   . Anemia   . Cellulitis of lower leg 12/24/2011  . Acute bronchitis   . Other abnormal blood chemistry   . Chronic systolic heart failure   . Acute on chronic systolic heart failure   . Cellulitis and abscess of leg, except foot   . Hypotension, unspecified   . Helicobacter pylori (H. pylori)   . Acute gastric ulcer with hemorrhage, without mention of obstruction   . CHF (congestive heart failure)   . Anxiety    Past Surgical History  Procedure Laterality Date  . Hip arthroscopy w/ labral repair    . Thyroidectomy    . Esophagogastroduodenoscopy  09/26/2011    Procedure: ESOPHAGOGASTRODUODENOSCOPY (EGD);  Surgeon: Zenovia Jarred, MD;  Location: [facility] ENDOSCOPY;  Service: Gastroenterology;  Laterality: N/A;  To be done at bedside.  . Esophagogastroduodenoscopy N/A 09/30/2013    Procedure: ESOPHAGOGASTRODUODENOSCOPY (EGD);  Surgeon: [name], MD;  Location: [facility];  Service: Endoscopy;  Laterality: N/A;  . Esophagogastroduodenoscopy N/A 11/25/2013    Procedure: ESOPHAGOGASTRODUODENOSCOPY (EGD);  Surgeon: [name], MD;  Location: [facility] ENDOSCOPY;  Service: Endoscopy;  Laterality: N/A;   History   Social History  . Marital Status: Single    Spouse Name: N/A  . Number of Children: 0  . Years of Education: 12   Occupational History  .      Retired   Social History Main Topics  . Smoking status: Never Smoker   . Smokeless tobacco: Never Used  . Alcohol Use: No  . Drug Use: No  . Sexual Activity: No   Other Topics Concern  . None   Social History Narrative   Patient lives at home alone. Patient is retired. Patient has high school education.   Caffeine- one daily.   Right handed.     Medications: Patient's Medications  New Prescriptions   No medications on file  Previous Medications   CARBIDOPA-LEVODOPA (SINEMET IR) 25-100 MG PER TABLET    TAKE ONE TABLET BY MOUTH 6TIMES DAILY. TAKE AT 6 AM, 9AM, 12PM, 3PM, 6PM AND 9PM   FERROUS SULFATE (IRON) 325 (65 FE) MG TABS    Take 1 capsule by mouth daily.   FUROSEMIDE (LASIX) 20 MG TABLET    Take 20 mg by mouth 2 (two) times daily as needed for fluid.    LORAZEPAM (ATIVAN) 0.5 MG TABLET    Take 1 tablet (0.5 mg total) by mouth 2 (two) times daily  as needed for anxiety.   MAGNESIUM PO    Take 1 tablet by mouth daily.   MULTIPLE VITAMINS-MINERALS (MULTIVITAMIN WITH MINERALS) TABLET    Take 1 tablet by mouth daily.   OXYCODONE (OXY IR/ROXICODONE) 5 MG IMMEDIATE RELEASE TABLET    Take 1 tablet (5 mg total) by mouth every 4 (four) hours as needed for moderate pain.   PRAMIPEXOLE (MIRAPEX) 1.5 MG TABLET    Take 1 tablet (1.5 mg total) by mouth 3 (three) times daily.   SENNA-DOCUSATE (SENOKOT-S) 8.6-50 MG PER TABLET    Take 1 tablet by mouth 2 (two) times daily.   TDAP (BOOSTRIX) 5-2.5-18.5 LF-MCG/0.5 INJECTION    Inject 0.5 mLs into the muscle once.   ZOSTER VACCINE LIVE, PF, (ZOSTAVAX) 47829 UNT/0.65ML INJECTION    Inject 0.65 mLs into the skin once.  Modified Medications   No medications on file  Discontinued Medications   No medications on  file     Review of Systems  Constitutional: Negative for fever and chills.  Musculoskeletal: Positive for arthralgias and gait problem.  Skin: Positive for rash.    Filed Vitals:   12/30/14 1544  BP: 130/78  Pulse: 77  Temp: 97.8 F (36.6 C)  TempSrc: Oral  Resp: 20  Height: $Remove'5\' 4"'NjEqMzD$  (1.626 m)  Weight: 173 lb 12.8 oz (78.835 kg)  SpO2: 93%   Body mass index is 29.82 kg/(m^2).  Physical Exam  Constitutional: She appears well-developed. No distress.  Looks ill in NAD. Unkept appearance. Sitting in w/c  Cardiovascular: Intact distal pulses and normal pulses.   Skin: Skin is warm, dry and intact. Rash noted. No abrasion and no burn noted. Rash is not pustular, not vesicular and not urticarial. There is erythema.        Labs reviewed: Admission on 12/08/2014, Discharged on 12/17/2014  Component Date Value Ref Range Status  . WBC 12/09/2014 8.3  4.0 - 10.5 K/uL Final  . RBC 12/09/2014 4.68  3.87 - 5.11 MIL/uL Final  . Hemoglobin 12/09/2014 12.1  12.0 - 15.0 g/dL Final  . HCT 12/09/2014 37.8  36.0 - 46.0 % Final  . MCV 12/09/2014 80.8  78.0 - 100.0 fL Final  . MCH 12/09/2014 25.9* 26.0 - 34.0 pg Final  . MCHC 12/09/2014 32.0  30.0 - 36.0 g/dL Final  . RDW 12/09/2014 17.5* 11.5 - 15.5 % Final  . Platelets 12/09/2014 145* 150 - 400 K/uL Final  . Neutrophils Relative % 12/09/2014 74  43 - 77 % Final  . Neutro Abs 12/09/2014 6.2  1.7 - 7.7 K/uL Final  . Lymphocytes Relative 12/09/2014 13  12 - 46 % Final  . Lymphs Abs 12/09/2014 1.0  0.7 - 4.0 K/uL Final  . Monocytes Relative 12/09/2014 7  3 - 12 % Final  . Monocytes Absolute 12/09/2014 0.6  0.1 - 1.0 K/uL Final  . Eosinophils Relative 12/09/2014 5  0 - 5 % Final  . Eosinophils Absolute 12/09/2014 0.4  0.0 - 0.7 K/uL Final  . Basophils Relative 12/09/2014 1  0 - 1 % Final  . Basophils Absolute 12/09/2014 0.1  0.0 - 0.1 K/uL Final  . Sodium 12/09/2014 138  135 - 145 mmol/L Final  . Potassium 12/09/2014 3.9  3.5 - 5.1 mmol/L  Final  . Chloride 12/09/2014 104  96 - 112 mmol/L Final  . CO2 12/09/2014 28  19 - 32 mmol/L Final  . Glucose, Bld 12/09/2014 112* 70 - 99 mg/dL Final  . BUN 12/09/2014 14  6 -  23 mg/dL Final  . Creatinine, Ser 12/09/2014 0.77  0.50 - 1.10 mg/dL Final  . Calcium 12/09/2014 8.8  8.4 - 10.5 mg/dL Final  . Total Protein 12/09/2014 5.7* 6.0 - 8.3 g/dL Final  . Albumin 12/09/2014 3.0* 3.5 - 5.2 g/dL Final  . AST 12/09/2014 31  0 - 37 U/L Final  . ALT 12/09/2014 6  0 - 35 U/L Final  . Alkaline Phosphatase 12/09/2014 67  39 - 117 U/L Final  . Total Bilirubin 12/09/2014 0.6  0.3 - 1.2 mg/dL Final  . GFR calc non Af Amer 12/09/2014 81* >90 mL/min Final  . GFR calc Af Amer 12/09/2014 >90  >90 mL/min Final   Comment: (NOTE) The eGFR has been calculated using the CKD EPI equation. This calculation has not been validated in all clinical situations. eGFR's persistently <90 mL/min signify possible Chronic Kidney Disease.   . Anion gap 12/09/2014 6  5 - 15 Final  Admission on 12/07/2014, Discharged on 12/08/2014  Component Date Value Ref Range Status  . Lactic Acid, Venous 12/07/2014 0.56  0.5 - 2.0 mmol/L Final  . WBC 12/07/2014 9.0  4.0 - 10.5 K/uL Final  . RBC 12/07/2014 4.58  3.87 - 5.11 MIL/uL Final  . Hemoglobin 12/07/2014 11.6* 12.0 - 15.0 g/dL Final  . HCT 12/07/2014 36.9  36.0 - 46.0 % Final  . MCV 12/07/2014 80.6  78.0 - 100.0 fL Final  . MCH 12/07/2014 25.3* 26.0 - 34.0 pg Final  . MCHC 12/07/2014 31.4  30.0 - 36.0 g/dL Final  . RDW 12/07/2014 17.7* 11.5 - 15.5 % Final  . Platelets 12/07/2014 153  150 - 400 K/uL Final   Comment: SPECIMEN CHECKED FOR CLOTS REPEATED TO VERIFY PLATELET COUNT CONFIRMED BY SMEAR   . Neutrophils Relative % 12/07/2014 76  43 - 77 % Final  . Lymphocytes Relative 12/07/2014 13  12 - 46 % Final  . Monocytes Relative 12/07/2014 9  3 - 12 % Final  . Eosinophils Relative 12/07/2014 2  0 - 5 % Final  . Basophils Relative 12/07/2014 0  0 - 1 % Final  . Band  Neutrophils 12/07/2014 0  0 - 10 % Final  . Metamyelocytes Relative 12/07/2014 0   Final  . Myelocytes 12/07/2014 0   Final  . Promyelocytes Absolute 12/07/2014 0   Final  . Blasts 12/07/2014 0   Final  . nRBC 12/07/2014 0  0 /100 WBC Final  . Neutro Abs 12/07/2014 6.8  1.7 - 7.7 K/uL Final  . Lymphs Abs 12/07/2014 1.2  0.7 - 4.0 K/uL Final  . Monocytes Absolute 12/07/2014 0.8  0.1 - 1.0 K/uL Final  . Eosinophils Absolute 12/07/2014 0.2  0.0 - 0.7 K/uL Final  . Basophils Absolute 12/07/2014 0.0  0.0 - 0.1 K/uL Final  . Specimen Description 12/07/2014 BLOOD RIGHT ARM   Final  . Special Requests 12/07/2014 BOTTLES DRAWN AEROBIC AND ANAEROBIC 10CC EACH   Final  . Culture 12/07/2014    Final                   Value:NO GROWTH 5 DAYS Note: Culture results may be compromised due to an excessive volume of blood received in culture bottles. Performed at [facility]   . Report Status 12/07/2014 12/13/2014 FINAL   Final  . Specimen Description 12/07/2014 BLOOD RIGHT HAND   Final  . Special Requests 12/07/2014 BOTTLES DRAWN AEROBIC ONLY 10CC   Final  . Culture 12/07/2014    Final  Value:NO GROWTH 5 DAYS Note: Culture results may be compromised due to an excessive volume of blood received in culture bottles. Performed at [facility]   . Report Status 12/07/2014 12/13/2014 FINAL   Final  . Sodium 12/07/2014 141  135 - 145 mmol/L Final  . Potassium 12/07/2014 3.8  3.5 - 5.1 mmol/L Final  . Chloride 12/07/2014 109  96 - 112 mmol/L Final  . CO2 12/07/2014 26  19 - 32 mmol/L Final  . Glucose, Bld 12/07/2014 102* 70 - 99 mg/dL Final  . BUN 12/07/2014 16  6 - 23 mg/dL Final  . Creatinine, Ser 12/07/2014 0.75  0.50 - 1.10 mg/dL Final  . Calcium 12/07/2014 8.8  8.4 - 10.5 mg/dL Final  . GFR calc non Af Amer 12/07/2014 81* >90 mL/min Final  . GFR calc Af Amer 12/07/2014 >90  >90 mL/min Final   Comment: (NOTE) The eGFR has been calculated using the CKD EPI  equation. This calculation has not been validated in all clinical situations. eGFR's persistently <90 mL/min signify possible Chronic Kidney Disease.   . Anion gap 12/07/2014 6  5 - 15 Final  . Color, Urine 12/07/2014 YELLOW  YELLOW Final  . APPearance 12/07/2014 CLOUDY* CLEAR Final  . Specific Gravity, Urine 12/07/2014 1.019  1.005 - 1.030 Final  . pH 12/07/2014 6.5  5.0 - 8.0 Final  . Glucose, UA 12/07/2014 NEGATIVE  NEGATIVE mg/dL Final  . Hgb urine dipstick 12/07/2014 NEGATIVE  NEGATIVE Final  . Bilirubin Urine 12/07/2014 NEGATIVE  NEGATIVE Final  . Ketones, ur 12/07/2014 NEGATIVE  NEGATIVE mg/dL Final  . Protein, ur 12/07/2014 NEGATIVE  NEGATIVE mg/dL Final  . Urobilinogen, UA 12/07/2014 1.0  0.0 - 1.0 mg/dL Final  . Nitrite 12/07/2014 NEGATIVE  NEGATIVE Final  . Leukocytes, UA 12/07/2014 MODERATE* NEGATIVE Final  . Squamous Epithelial / LPF 12/07/2014 MANY* RARE Final  . WBC, UA 12/07/2014 7-10  <3 WBC/hpf Final  . Bacteria, UA 12/07/2014 MANY* RARE Final  . Crystals 12/07/2014 CA OXALATE CRYSTALS* NEGATIVE Final  . Sodium 12/07/2014 139  135 - 145 mmol/L Final  . Potassium 12/07/2014 3.5  3.5 - 5.1 mmol/L Final  . Chloride 12/07/2014 107  96 - 112 mmol/L Final  . CO2 12/07/2014 26  19 - 32 mmol/L Final  . Glucose, Bld 12/07/2014 107* 70 - 99 mg/dL Final  . BUN 12/07/2014 12  6 - 23 mg/dL Final  . Creatinine, Ser 12/07/2014 0.71  0.50 - 1.10 mg/dL Final  . Calcium 12/07/2014 8.4  8.4 - 10.5 mg/dL Final  . Total Protein 12/07/2014 5.8* 6.0 - 8.3 g/dL Final  . Albumin 12/07/2014 3.3* 3.5 - 5.2 g/dL Final  . AST 12/07/2014 26  0 - 37 U/L Final  . ALT 12/07/2014 18  0 - 35 U/L Final  . Alkaline Phosphatase 12/07/2014 69  39 - 117 U/L Final  . Total Bilirubin 12/07/2014 1.2  0.3 - 1.2 mg/dL Final  . GFR calc non Af Amer 12/07/2014 83* >90 mL/min Final  . GFR calc Af Amer 12/07/2014 >90  >90 mL/min Final   Comment: (NOTE) The eGFR has been calculated using the CKD EPI  equation. This calculation has not been validated in all clinical situations. eGFR's persistently <90 mL/min signify possible Chronic Kidney Disease.   . Anion gap 12/07/2014 6  5 - 15 Final  . WBC 12/07/2014 9.6  4.0 - 10.5 K/uL Final  . RBC 12/07/2014 4.57  3.87 - 5.11 MIL/uL Final  . Hemoglobin 12/07/2014  11.7* 12.0 - 15.0 g/dL Final  . HCT 12/07/2014 36.7  36.0 - 46.0 % Final  . MCV 12/07/2014 80.3  78.0 - 100.0 fL Final  . MCH 12/07/2014 25.6* 26.0 - 34.0 pg Final  . MCHC 12/07/2014 31.9  30.0 - 36.0 g/dL Final  . RDW 12/07/2014 17.6* 11.5 - 15.5 % Final  . Platelets 12/07/2014 149* 150 - 400 K/uL Final  . Neutrophils Relative % 12/07/2014 80* 43 - 77 % Final  . Neutro Abs 12/07/2014 7.7  1.7 - 7.7 K/uL Final  . Lymphocytes Relative 12/07/2014 10* 12 - 46 % Final  . Lymphs Abs 12/07/2014 0.9  0.7 - 4.0 K/uL Final  . Monocytes Relative 12/07/2014 8  3 - 12 % Final  . Monocytes Absolute 12/07/2014 0.8  0.1 - 1.0 K/uL Final  . Eosinophils Relative 12/07/2014 2  0 - 5 % Final  . Eosinophils Absolute 12/07/2014 0.2  0.0 - 0.7 K/uL Final  . Basophils Relative 12/07/2014 0  0 - 1 % Final  . Basophils Absolute 12/07/2014 0.0  0.0 - 0.1 K/uL Final  . WBC 12/08/2014 7.1  4.0 - 10.5 K/uL Final  . RBC 12/08/2014 4.35  3.87 - 5.11 MIL/uL Final  . Hemoglobin 12/08/2014 11.1* 12.0 - 15.0 g/dL Final  . HCT 12/08/2014 35.6* 36.0 - 46.0 % Final  . MCV 12/08/2014 81.8  78.0 - 100.0 fL Final  . MCH 12/08/2014 25.5* 26.0 - 34.0 pg Final  . MCHC 12/08/2014 31.2  30.0 - 36.0 g/dL Final  . RDW 12/08/2014 17.7* 11.5 - 15.5 % Final  . Platelets 12/08/2014 133* 150 - 400 K/uL Final  . Sodium 12/08/2014 139  135 - 145 mmol/L Final  . Potassium 12/08/2014 4.1  3.5 - 5.1 mmol/L Final  . Chloride 12/08/2014 107  96 - 112 mmol/L Final  . CO2 12/08/2014 25  19 - 32 mmol/L Final  . Glucose, Bld 12/08/2014 131* 70 - 99 mg/dL Final  . BUN 12/08/2014 14  6 - 23 mg/dL Final  . Creatinine, Ser 12/08/2014 0.81   0.50 - 1.10 mg/dL Final  . Calcium 12/08/2014 8.5  8.4 - 10.5 mg/dL Final  . GFR calc non Af Amer 12/08/2014 70* >90 mL/min Final  . GFR calc Af Amer 12/08/2014 81* >90 mL/min Final   Comment: (NOTE) The eGFR has been calculated using the CKD EPI equation. This calculation has not been validated in all clinical situations. eGFR's persistently <90 mL/min signify possible Chronic Kidney Disease.   . Anion gap 12/08/2014 7  5 - 15 Final     Assessment/Plan   ICD-9-CM ICD-10-CM   1. Cellulitis of lower extremity, unspecified laterality in both legs 682.6 L03.119 ciprofloxacin (CIPRO) 500 MG tablet  2. Parkinson's disease - unchanged 332.0 G20   3. Peripheral neuropathy - unchanged 356.9 G62.9     --push fluids and rest  --keep legs elevated when seated  --wear compression stockings daily when up  --brother c/a her drinking a lot of tea. May have herbal caffeine (like celestial seasonings) free tea  --eat 1 Activia daily to help colon bacteria  --keep appointment with Dr Mariea Clonts as scheduled   Cordella Register. Perlie Gold  [facility] [address] [place], [place] [address] [phone] Office (Wednesdays and Fridays 8 AM - 5 PM) [phone] Cell (Monday-Friday 8 AM - 5 PM)

## 2014-12-30 NOTE — Patient Instructions (Addendum)
--  push fluids and rest  --keep legs elevated when seated  --wear compression stockings daily when up  --may have herbal caffeine (like celestial seasonings) free tea  --eat 1 Activia daily to help colon bacteria  --keep appointment with Dr Mariea Clonts as scheduled

## 2015-01-02 DIAGNOSIS — I5030 Unspecified diastolic (congestive) heart failure: Secondary | ICD-10-CM | POA: Diagnosis not present

## 2015-01-02 DIAGNOSIS — I10 Essential (primary) hypertension: Secondary | ICD-10-CM | POA: Diagnosis not present

## 2015-01-02 DIAGNOSIS — D649 Anemia, unspecified: Secondary | ICD-10-CM | POA: Diagnosis not present

## 2015-01-02 DIAGNOSIS — M1990 Unspecified osteoarthritis, unspecified site: Secondary | ICD-10-CM | POA: Diagnosis not present

## 2015-01-02 DIAGNOSIS — G20 Parkinson's disease: Secondary | ICD-10-CM | POA: Diagnosis not present

## 2015-01-03 DIAGNOSIS — M1990 Unspecified osteoarthritis, unspecified site: Secondary | ICD-10-CM | POA: Diagnosis not present

## 2015-01-03 DIAGNOSIS — I10 Essential (primary) hypertension: Secondary | ICD-10-CM | POA: Diagnosis not present

## 2015-01-03 DIAGNOSIS — G20 Parkinson's disease: Secondary | ICD-10-CM | POA: Diagnosis not present

## 2015-01-03 DIAGNOSIS — D649 Anemia, unspecified: Secondary | ICD-10-CM | POA: Diagnosis not present

## 2015-01-03 DIAGNOSIS — I5030 Unspecified diastolic (congestive) heart failure: Secondary | ICD-10-CM | POA: Diagnosis not present

## 2015-01-05 DIAGNOSIS — I10 Essential (primary) hypertension: Secondary | ICD-10-CM | POA: Diagnosis not present

## 2015-01-05 DIAGNOSIS — G20 Parkinson's disease: Secondary | ICD-10-CM | POA: Diagnosis not present

## 2015-01-05 DIAGNOSIS — M1990 Unspecified osteoarthritis, unspecified site: Secondary | ICD-10-CM | POA: Diagnosis not present

## 2015-01-05 DIAGNOSIS — I5030 Unspecified diastolic (congestive) heart failure: Secondary | ICD-10-CM | POA: Diagnosis not present

## 2015-01-05 DIAGNOSIS — D649 Anemia, unspecified: Secondary | ICD-10-CM | POA: Diagnosis not present

## 2015-01-06 DIAGNOSIS — I10 Essential (primary) hypertension: Secondary | ICD-10-CM | POA: Diagnosis not present

## 2015-01-06 DIAGNOSIS — M1990 Unspecified osteoarthritis, unspecified site: Secondary | ICD-10-CM | POA: Diagnosis not present

## 2015-01-06 DIAGNOSIS — I5030 Unspecified diastolic (congestive) heart failure: Secondary | ICD-10-CM | POA: Diagnosis not present

## 2015-01-06 DIAGNOSIS — G20 Parkinson's disease: Secondary | ICD-10-CM | POA: Diagnosis not present

## 2015-01-06 DIAGNOSIS — D649 Anemia, unspecified: Secondary | ICD-10-CM | POA: Diagnosis not present

## 2015-01-11 ENCOUNTER — Telehealth: Payer: Self-pay | Admitting: *Deleted

## 2015-01-11 DIAGNOSIS — M1990 Unspecified osteoarthritis, unspecified site: Secondary | ICD-10-CM | POA: Diagnosis not present

## 2015-01-11 DIAGNOSIS — D649 Anemia, unspecified: Secondary | ICD-10-CM | POA: Diagnosis not present

## 2015-01-11 DIAGNOSIS — I5030 Unspecified diastolic (congestive) heart failure: Secondary | ICD-10-CM | POA: Diagnosis not present

## 2015-01-11 DIAGNOSIS — G20 Parkinson's disease: Secondary | ICD-10-CM | POA: Diagnosis not present

## 2015-01-11 DIAGNOSIS — I10 Essential (primary) hypertension: Secondary | ICD-10-CM | POA: Diagnosis not present

## 2015-01-11 NOTE — Telephone Encounter (Signed)
Physical therapist called regarding the condition of [name] legs, she stated that the patient was still experiencing some pain, swelling and redness. I informed the therapist that I would recommend that the patient come in the office to be seen , scheduled an appointment with [name] tomorrow.

## 2015-01-11 NOTE — Telephone Encounter (Signed)
Physical therapist, Eartha Inch called asking for verbal orders to extend physical therapy for 5 weeks, 2x aweek for 2 weeks and 1x a week for 3 weeks. I called back and gave the verbal orders per office protocol

## 2015-01-12 ENCOUNTER — Encounter: Payer: Self-pay | Admitting: Nurse Practitioner

## 2015-01-12 ENCOUNTER — Telehealth: Payer: Self-pay | Admitting: *Deleted

## 2015-01-12 ENCOUNTER — Ambulatory Visit (INDEPENDENT_AMBULATORY_CARE_PROVIDER_SITE_OTHER): Payer: Medicare Other | Admitting: Nurse Practitioner

## 2015-01-12 VITALS — BP 148/88 | HR 77 | Temp 98.2°F | Ht 64.0 in | Wt 175.0 lb

## 2015-01-12 DIAGNOSIS — G20 Parkinson's disease: Secondary | ICD-10-CM | POA: Diagnosis not present

## 2015-01-12 DIAGNOSIS — M79606 Pain in leg, unspecified: Secondary | ICD-10-CM

## 2015-01-12 DIAGNOSIS — D649 Anemia, unspecified: Secondary | ICD-10-CM | POA: Diagnosis not present

## 2015-01-12 DIAGNOSIS — L03119 Cellulitis of unspecified part of limb: Secondary | ICD-10-CM

## 2015-01-12 DIAGNOSIS — I5030 Unspecified diastolic (congestive) heart failure: Secondary | ICD-10-CM | POA: Diagnosis not present

## 2015-01-12 DIAGNOSIS — I10 Essential (primary) hypertension: Secondary | ICD-10-CM | POA: Diagnosis not present

## 2015-01-12 DIAGNOSIS — M1990 Unspecified osteoarthritis, unspecified site: Secondary | ICD-10-CM | POA: Diagnosis not present

## 2015-01-12 MED ORDER — GABAPENTIN 100 MG PO CAPS
100.0000 mg | ORAL_CAPSULE | Freq: Three times a day (TID) | ORAL | Status: DC
Start: 2015-01-12 — End: 2015-04-28

## 2015-01-12 NOTE — Patient Instructions (Signed)
START gabapentin 100 mg three times daily for pain in legs

## 2015-01-12 NOTE — Telephone Encounter (Signed)
[name] with [facility] called and stated that she saw patient this morning and her BP was 172/107. Stated that patient is feeling fine and there are no symptoms or signs. Patient has an appointment today with Janett Billow. Printed and placed in patients chart.

## 2015-01-12 NOTE — Progress Notes (Signed)
Patient ID: [name], female   DOB: 10-15-1940, 74 y.o.   MRN: [id number]    PCP: Hollace Kinnier, DO  Allergies  Allergen Reactions  . Codeine Other (See Comments)    unknown  . Penicillins Other (See Comments)    unknown    Chief Complaint  Patient presents with  . Acute Visit    Patient with bilateral pain related to cellulitis      HPI: Patient is a 74 y.o. female seen in the office today due to leg pain. pt was seen on 12/30/14 and placed on ciprofloxacin due to cellulitis of bilateral LE for 10 days. Does not feel like they are getting any better. Complaint over pain Legs red which they normally are, redness no worse. Burning sensation, tingling that comes and goes.  Burning sensation that comes and does throughout her legs into thigh-- notices more when shes resting.   Review of Systems:  Review of Systems  Constitutional: Negative for fever and chills.  HENT: Negative for congestion.   Respiratory: Negative for shortness of breath and wheezing.   Cardiovascular: Negative for chest pain and leg swelling.       Redness of legs resolved, wearing compression hose  Genitourinary: Negative for dysuria.  Musculoskeletal: Positive for myalgias.       Numbness and tingling in lower extremities   Skin: Negative for rash.  Neurological: Positive for tremors.  Psychiatric/Behavioral: The patient is nervous/anxious.     Past Medical History  Diagnosis Date  . Parkinson's disease   . Hypertension   . Arthritis   . Anemia   . Cellulitis of lower leg 12/24/2011  . Acute bronchitis   . Other abnormal blood chemistry   . Chronic systolic heart failure   . Acute on chronic systolic heart failure   . Cellulitis and abscess of leg, except foot   . Hypotension, unspecified   . Helicobacter pylori (H. pylori)   . Acute gastric ulcer with hemorrhage, without mention of obstruction   . CHF (congestive heart failure)   . Anxiety    Past Surgical History  Procedure Laterality Date   . Hip arthroscopy w/ labral repair    . Thyroidectomy    . Esophagogastroduodenoscopy  09/26/2011    Procedure: ESOPHAGOGASTRODUODENOSCOPY (EGD);  Surgeon: Zenovia Jarred, MD;  Location: [facility] ENDOSCOPY;  Service: Gastroenterology;  Laterality: N/A;  To be done at bedside.  . Esophagogastroduodenoscopy N/A 09/30/2013    Procedure: ESOPHAGOGASTRODUODENOSCOPY (EGD);  Surgeon: [name], MD;  Location: [facility];  Service: Endoscopy;  Laterality: N/A;  . Esophagogastroduodenoscopy N/A 11/25/2013    Procedure: ESOPHAGOGASTRODUODENOSCOPY (EGD);  Surgeon: [name], MD;  Location: [facility] ENDOSCOPY;  Service: Endoscopy;  Laterality: N/A;   Social History:   reports that she has never smoked. She has never used smokeless tobacco. She reports that she does not drink alcohol or use illicit drugs.  Family History  Problem Relation Age of Onset  . GER disease Mother   . Heart disease Father     Medications: Patient's Medications  New Prescriptions   No medications on file  Previous Medications   CARBIDOPA-LEVODOPA (SINEMET IR) 25-100 MG PER TABLET    TAKE ONE TABLET BY MOUTH 6TIMES DAILY. TAKE AT 6 AM, 9AM, 12PM, 3PM, 6PM AND 9PM   CIPROFLOXACIN (CIPRO) 500 MG TABLET    Take 1 tablet (500 mg total) by mouth 2 (two) times daily.   FERROUS SULFATE (IRON) 325 (65 FE) MG TABS    Take 1  capsule by mouth daily.   FUROSEMIDE (LASIX) 20 MG TABLET    Take 20 mg by mouth 2 (two) times daily as needed for fluid.    LORAZEPAM (ATIVAN) 0.5 MG TABLET    Take 1 tablet (0.5 mg total) by mouth 2 (two) times daily as needed for anxiety.   MAGNESIUM PO    Take 1 tablet by mouth daily.   MULTIPLE VITAMINS-MINERALS (MULTIVITAMIN WITH MINERALS) TABLET    Take 1 tablet by mouth daily.   OXYCODONE (OXY IR/ROXICODONE) 5 MG IMMEDIATE RELEASE TABLET    Take 1 tablet (5 mg total) by mouth every 4 (four) hours as needed for moderate pain.   PRAMIPEXOLE (MIRAPEX) 1.5 MG TABLET    Take 1 tablet (1.5 mg total) by mouth 3 (three)  times daily.   SENNA-DOCUSATE (SENOKOT-S) 8.6-50 MG PER TABLET    Take 1 tablet by mouth 2 (two) times daily.   TDAP (BOOSTRIX) 5-2.5-18.5 LF-MCG/0.5 INJECTION    Inject 0.5 mLs into the muscle once.   ZOSTER VACCINE LIVE, PF, (ZOSTAVAX) 72620 UNT/0.65ML INJECTION    Inject 0.65 mLs into the skin once.  Modified Medications   No medications on file  Discontinued Medications   No medications on file     Physical Exam:  Filed Vitals:   01/12/15 1556  BP: 148/88  Pulse: 77  Temp: 98.2 F (36.8 C)  TempSrc: Oral  Height: 5\' 4"  (1.626 m)  Weight: 175 lb (79.379 kg)  SpO2: 95%    Physical Exam  Constitutional: She is oriented to person, place, and time.  Neck: No JVD present.  Cardiovascular: Normal rate, regular rhythm, normal heart sounds and intact distal pulses.   Pulmonary/Chest: Effort normal and breath sounds normal.  Abdominal: Soft. Bowel sounds are normal.  Musculoskeletal: She exhibits no edema or tenderness.  Walks with rollator walker; stooped posture  Neurological: She is alert and oriented to person, place, and time. No sensory deficit. She exhibits abnormal muscle tone. Coordination abnormal.  Resting tremor of all 4 extremities  Skin: Skin is warm and dry. There is erythema (redness to LE but no worse than baseline).  Erythema of lower legs stable  Psychiatric: She has a normal mood and affect.    Labs reviewed: Basic Metabolic Panel:  Recent Labs  02/11/14 1202  12/07/14 0750 12/08/14 0514 12/09/14 0709  NA 142  < > 139 139 138  K 4.3  < > 3.5 4.1 3.9  CL 102  < > 107 107 104  CO2 23  < > 26 25 28   GLUCOSE 97  < > 107* 131* 112*  BUN 15  < > 12 14 14   CREATININE 0.77  < > 0.71 0.81 0.77  CALCIUM 9.2  < > 8.4 8.5 8.8  TSH 0.907  --   --   --   --   < > = values in this interval not displayed. Liver Function Tests:  Recent Labs  08/08/14 0600 12/07/14 0750 12/09/14 0709  AST 17 26 31   ALT 8 18 6   ALKPHOS 58 69 67  BILITOT 0.4 1.2 0.6    PROT 5.2* 5.8* 5.7*  ALBUMIN 2.6* 3.3* 3.0*   No results for input(s): LIPASE, AMYLASE in the last 8760 hours. No results for input(s): AMMONIA in the last 8760 hours. CBC:  Recent Labs  12/07/14 0407 12/07/14 0750 12/08/14 0514 12/09/14 0709  WBC 9.0 9.6 7.1 8.3  NEUTROABS 6.8 7.7  --  6.2  HGB 11.6* 11.7* 11.1* 12.1  HCT 36.9 36.7 35.6* 37.8  MCV 80.6 80.3 81.8 80.8  PLT 153 149* 133* 145*   Lipid Panel: No results for input(s): CHOL, HDL, LDLCALC, TRIG, CHOLHDL, LDLDIRECT in the last 8760 hours. TSH:  Recent Labs  02/11/14 1202  TSH 0.907   A1C: No results found for: HGBA1C   Assessment/Plan  1. Cellulitis of lower extremity, unspecified laterality -has improved, cont cipro to complete course.   2. Pain of lower extremity, unspecified laterality -due to neuropathy, will start low dose gabapentin at this time - gabapentin (NEURONTIN) 100 MG capsule; Take 1 capsule (100 mg total) by mouth 3 (three) times daily.  Dispense: 90 capsule; Refill: 3  To follow up with Dr Mariea Clonts in 2 months

## 2015-01-13 ENCOUNTER — Telehealth: Payer: Self-pay | Admitting: *Deleted

## 2015-01-13 DIAGNOSIS — M1990 Unspecified osteoarthritis, unspecified site: Secondary | ICD-10-CM | POA: Diagnosis not present

## 2015-01-13 DIAGNOSIS — D649 Anemia, unspecified: Secondary | ICD-10-CM | POA: Diagnosis not present

## 2015-01-13 DIAGNOSIS — I10 Essential (primary) hypertension: Secondary | ICD-10-CM | POA: Diagnosis not present

## 2015-01-13 DIAGNOSIS — I5030 Unspecified diastolic (congestive) heart failure: Secondary | ICD-10-CM | POA: Diagnosis not present

## 2015-01-13 DIAGNOSIS — G20 Parkinson's disease: Secondary | ICD-10-CM | POA: Diagnosis not present

## 2015-01-13 NOTE — Telephone Encounter (Signed)
[facility] called to continue ST visits for [facility] to continue training voicing.  Approval given

## 2015-01-16 ENCOUNTER — Ambulatory Visit (INDEPENDENT_AMBULATORY_CARE_PROVIDER_SITE_OTHER): Payer: Medicare Other | Admitting: Neurology

## 2015-01-16 ENCOUNTER — Encounter: Payer: Self-pay | Admitting: Neurology

## 2015-01-16 VITALS — BP 149/76 | HR 78 | Resp 16 | Ht 64.0 in | Wt 170.0 lb

## 2015-01-16 DIAGNOSIS — L03116 Cellulitis of left lower limb: Secondary | ICD-10-CM | POA: Diagnosis not present

## 2015-01-16 DIAGNOSIS — Z9181 History of falling: Secondary | ICD-10-CM | POA: Diagnosis not present

## 2015-01-16 DIAGNOSIS — G20 Parkinson's disease: Secondary | ICD-10-CM | POA: Diagnosis not present

## 2015-01-16 DIAGNOSIS — G249 Dystonia, unspecified: Secondary | ICD-10-CM | POA: Diagnosis not present

## 2015-01-16 DIAGNOSIS — L03115 Cellulitis of right lower limb: Secondary | ICD-10-CM | POA: Diagnosis not present

## 2015-01-16 MED ORDER — CARBIDOPA-LEVODOPA 25-100 MG PO TBDP: 1.0000 | ORAL_TABLET | Freq: Once | ORAL | Status: DC | PRN

## 2015-01-16 NOTE — Progress Notes (Signed)
Subjective:    Patient ID: [name] is a 74 y.o. female.  HPI     Interim history:    [name] is a 75 year old right-handed woman with an underlying complex medical history of hypertension, arthritis, anemia, cellulitis, chronic systolic heart failure, peptic ulcer disease, and chronic low back pain with history of T12 compression fracture, status post kyphoplasty in October 2010, who presents for follow-up consultation of her Parkinson's disease, complicated by motor complications, motor fluctuations and recurrent falls. The patient is unaccompanied today and came via [facility] bus. I first met her on 11/01/2014, at which time she reported more freezing and feeling worse. She fell the night before. She did not injure herself. She was living alone. She was getting Meals on Wheels and has a call alert button. I kept her on Sinemet 1-1/2 pills 4 times a day and Mirapex 1-1/2 mg strength one pill 3 times a day which was maxed out. She presents for a sooner than scheduled appointment due to exacerbation of her tremors. In the interim, on 12/30/2014 she was seen at [facility] senior care for a cellulitis. She was placed on Cipro. She was also seen on 01/12/15 for follow up and started on gabapentin 100 mg tid for leg pain.   Today, 01/16/2015: She reports that her brother, [name], who is 66 years old, sees her every day. She takes ativan 0.5 mg as needed for anxiety, but not daily. She has had more freezing, but taking C/L 1 pill 6 times a day helped some. She takes her Mirapex 1.5 mg 3 times a day with the first 3 doses of C/L. She has not started the gabapentin yet, is a little afraid to start it. She is getting [facility] at this time and enjoying it. Her cellulitis is getting better and finishing cipro tonight. She never married. She has no children. She has a big extended family. She has 1 sister and 5 brothers. Thankfully, she has not fallen recently. Her freezing episodes happen in the late morning  and in the late afternoon typically.  Previously:  She previously saw Dr. Jim [name] on 02/28/2014, at which time she was kept on levodopa at the current dose and Mirapex at the current dose. Prior to that she used to see Dr. Krista [name] (05/25/13) and has also seen our nurse practitioner, [name], last on 01/28/14. Her symptoms date back to 1999 when she first noticed L hand stiffness, fine motor dyscontrol and gait changes. Today, she reports, feeling worse with more freezing. She fell last night but thankfully did not injure herself. She was admitted to the hospital from 08/06/14 to 08/10/14 for N/V/D.   She lives alone. She has 5 brothers and 1 sister, 1 brother lives in [place] but otherwise her siblings are close by. One brother brings her supper every night. She gets Meals on Wheels. She has a call alert button. She has no family history of Parkinson's disease. She uses a 2 wheeled walker at the house. She has generalized dyskinesias. She may not drink enough water. She has lower extremity swelling but this has improved. Her short-term memory is getting worse she feels. She denies any hallucinations.   Her Past Medical History Is Significant For: Past Medical History  Diagnosis Date  . Parkinson's disease   . Hypertension   . Arthritis   . Anemia   . Cellulitis of lower leg 12/24/2011  . Acute bronchitis   . Other abnormal blood chemistry   .  Chronic systolic heart failure   . Acute on chronic systolic heart failure   . Cellulitis and abscess of leg, except foot   . Hypotension, unspecified   . Helicobacter pylori (H. pylori)   . Acute gastric ulcer with hemorrhage, without mention of obstruction   . CHF (congestive heart failure)   . Anxiety     Her Past Surgical History Is Significant For: Past Surgical History  Procedure Laterality Date  . Hip arthroscopy w/ labral repair    . Thyroidectomy    . Esophagogastroduodenoscopy  09/26/2011    Procedure: ESOPHAGOGASTRODUODENOSCOPY  (EGD);  Surgeon: Zenovia Jarred, MD;  Location: [facility] ENDOSCOPY;  Service: Gastroenterology;  Laterality: N/A;  To be done at bedside.  . Esophagogastroduodenoscopy N/A 09/30/2013    Procedure: ESOPHAGOGASTRODUODENOSCOPY (EGD);  Surgeon: [name], MD;  Location: [place];  Service: Endoscopy;  Laterality: N/A;  . Esophagogastroduodenoscopy N/A 11/25/2013    Procedure: ESOPHAGOGASTRODUODENOSCOPY (EGD);  Surgeon: [name], MD;  Location: [facility]COPY;  Service: Endoscopy;  Laterality: N/A;    Her Family History Is Significant For: Family History  Problem Relation Age of Onset  . GER disease Mother   . Heart disease Father     Her Social History Is Significant For: History   Social History  . Marital Status: Single    Spouse Name: N/A  . Number of Children: 0  . Years of Education: 12   Occupational History  .      Retired   Social History Main Topics  . Smoking status: Never Smoker   . Smokeless tobacco: Never Used  . Alcohol Use: No  . Drug Use: No  . Sexual Activity: No   Other Topics Concern  . None   Social History Narrative   Patient lives at home alone. Patient is retired. Patient has high school education.   Caffeine- one daily.   Right handed.    Her Allergies Are:  Allergies  Allergen Reactions  . Codeine Other (See Comments)    unknown  . Penicillins Other (See Comments)    unknown  :   Her Current Medications Are:  Outpatient Encounter Prescriptions as of 01/16/2015  Medication Sig  . carbidopa-levodopa (SINEMET IR) 25-100 MG per tablet TAKE ONE TABLET BY MOUTH 6TIMES DAILY. TAKE AT 6 AM, 9AM, 12PM, 3PM, 6PM AND 9PM  . Ferrous Sulfate (IRON) 325 (65 FE) MG TABS Take 1 capsule by mouth daily.  . furosemide (LASIX) 20 MG tablet Take 20 mg by mouth 2 (two) times daily as needed for fluid.   Marland Kitchen gabapentin (NEURONTIN) 100 MG capsule Take 1 capsule (100 mg total) by mouth 3 (three) times daily.  Marland Kitchen LORazepam (ATIVAN) 0.5 MG tablet Take 1 tablet (0.5  mg total) by mouth 2 (two) times daily as needed for anxiety.  Marland Kitchen MAGNESIUM PO Take 1 tablet by mouth daily.  . Multiple Vitamins-Minerals (MULTIVITAMIN WITH MINERALS) tablet Take 1 tablet by mouth daily.  Marland Kitchen oxyCODONE (OXY IR/ROXICODONE) 5 MG immediate release tablet Take 1 tablet (5 mg total) by mouth every 4 (four) hours as needed for moderate pain.  . pramipexole (MIRAPEX) 1.5 MG tablet Take 1 tablet (1.5 mg total) by mouth 3 (three) times daily.  Marland Kitchen senna-docusate (SENOKOT-S) 8.6-50 MG per tablet Take 1 tablet by mouth 2 (two) times daily.  . Tdap (BOOSTRIX) 5-2.5-18.5 LF-MCG/0.5 injection Inject 0.5 mLs into the muscle once.  . zoster vaccine live, PF, (ZOSTAVAX) 29518 UNT/0.65ML injection Inject 0.65 mLs into the skin once.  . [  DISCONTINUED] ciprofloxacin (CIPRO) 500 MG tablet Take 1 tablet (500 mg total) by mouth 2 (two) times daily.  :  Review of Systems:  Out of a complete 14 point review of systems, all are reviewed and negative with the exception of these symptoms as listed below:   Review of Systems  Neurological: Positive for tremors.       Increased freezing spells    Objective:  Neurologic Exam  Physical Exam Physical Examination:   Filed Vitals:   01/16/15 1527  BP: 149/76  Pulse: 78  Resp: 16    General Examination: The patient is a very pleasant 74 y.o. female in no acute distress. She is situated in her wheelchair. She is frail appearing. She is adequately to marginally groomed.   HEENT: Normocephalic, atraumatic, pupils are equal, round and reactive to light and accommodation. Funduscopic exam is normal with sharp disc margins noted. Extraocular tracking shows moderate saccadic breakdown without nystagmus noted. There is limitation to upper gaze. There is mild decrease in eye blink rate. Hearing is intact. Face is symmetric with mild facial masking and normal facial sensation. There is no lip, neck or jaw tremor. Neck is mildly rigid with intact passive ROM. There  are no carotid bruits on auscultation. Oropharynx exam reveals moderate mouth dryness. No significant airway crowding is noted. Mallampati is class II. Tongue protrudes centrally and palate elevates symmetrically. There is no drooling.   Chest: is clear to auscultation without wheezing, rhonchi or crackles noted.  Heart: sounds are regular and normal without murmurs, rubs or gallops noted.   Abdomen: is soft, non-tender and non-distended with normal bowel sounds appreciated on auscultation.  Extremities: There is 1+ pitting edema in the distal lower extremities bilaterally. Chronic stasis-[name] changes are noted in the distal legs bilaterally. There is mild erythema distally. There are spider veins.  Skin: is warm and dry with no trophic changes noted. Age-related changes are noted on the skin.   Musculoskeletal: exam reveals no obvious joint deformities, tenderness, joint swelling or erythema, except for arthritic changes in her hands.  Neurologically:  Mental status: The patient is awake and alert, paying good  attention. She is able to completely provide the history.  She is oriented to: person, place, situation, day of week, month of year and year. Her memory, attention, language and knowledge are mildly impaired. She has a mild degree of bradyphrenia. Speech is moderately hypophonic with minimal dysarthria noted. Mood is congruent and affect is normal.   Cranial nerves are as described above under HEENT exam. She is leaning to the right. It is difficult to examine her shoulder height and shoulder strength. Motor exam: Normal bulk, and strength for age is noted. There are mild to moderate generalized dyskinesias noted. Tone is mildly rigid with absence of cogwheeling. There is overall moderate bradykinesia. There is no drift or rebound.  There is a mild LUE and LLE resting tremor.  Romberg is not tested as she is not safe to stand unassisted.   Reflexes are 1+ in the upper extremities and  trace in the lower extremities.   Fine motor skills exam: Finger taps are moderately impaired on the right and moderately impaired on the left. Hand movements are mildly impaired on the right and moderately impaired on the left. RAP (rapid alternating patting) is moderately impaired on the right and moderately impaired on the left. Foot taps are moderately impaired on the right and moderately impaired on the left. Foot agility (in the form of heel stomping)  is mildly impaired on the right and moderately impaired on the left.    Cerebellar testing shows no dysmetria or intention tremor on finger to nose testing. Heel to shin is unremarkable bilaterally. There is no truncal or gait ataxia.   Sensory exam is intact to light touch, pinprick, vibration, temperature sense in the upper and lower extremities.   Gait, station and balance: She stands with difficulty and has to push herself up. She has 2 episodes of freezing while initiating movement. She walks slowly with a shuffle, using her 4 wheeled walker with seat.   Assessment and Plan:   In summary, CHARDAI GANGEMI is a very pleasant 74 year old female with an underlying medical history of hypertension, arthritis, anemia, cellulitis, chronic systolic heart failure and cor pulmonale, peptic ulcer disease, and chronic low back pain with history of T12 compression fracture, status post kyphoplasty in October 2010, who presents for follow-up consultation of her parkinsonism. Her history and physical exam are in keeping with, left-sided predominant PD, complicated by dyskinesias, overall frailty, lower extremity swelling, cellulitis, freezing and recent fall. She has had more freezing. I suggested she continue with Sinemet 25/100 mg 1 pill 6 times a day, at 6, 9, 12, 3 PM, 6 PM and 9 PM. She is advised to continue Mirapex 1.5 mg strength one pill 3 times a day, at 6 AM, 12 and 6 PM. I also suggest prn parcopa 25/100 mg 1/2 pill up to 2 time a day, late morning and  afternoon to help freezing spells. She is advised to strictly use her walker as directed. She is in [facility] and finds it helpful. I suggested she keep her appointment in June. For her foot and leg pain, she is encouraged to try the gabapentin, maybe with a slower titration, starting at 100 mg at night.  I answered all her questions today and the patient was in agreement with the above outlined plan. I spent 25 minutes in total face-to-face time with the patient, more than 50% of which was spent in counseling and coordination of care, reviewing test results, reviewing medication and discussing or reviewing the diagnosis of PD, its prognosis and treatment options.

## 2015-01-16 NOTE — Patient Instructions (Addendum)
We will try Parcopa, which is an orally disintegrating carbidopa-levodopa to help with excess tremors or freezing. Take 1/2 pill under your tongue up to 2 times a day, late morning and evening.  We will keep your regular Sinement the same: 1 pill 6 times a day, namely at 6 AM, 9 AM, 12, 3 PM ,6 PM and 9 PM.  We will keep your Mirapex 1.5 mg at 3 times a day, but take it at 6 AM, 12 and 6 PM.  You can start the gabapentin 100 mg, but perhaps a little slower, try 1 pill just at night for now.

## 2015-01-17 DIAGNOSIS — I5030 Unspecified diastolic (congestive) heart failure: Secondary | ICD-10-CM | POA: Diagnosis not present

## 2015-01-17 DIAGNOSIS — I10 Essential (primary) hypertension: Secondary | ICD-10-CM | POA: Diagnosis not present

## 2015-01-17 DIAGNOSIS — M1990 Unspecified osteoarthritis, unspecified site: Secondary | ICD-10-CM | POA: Diagnosis not present

## 2015-01-17 DIAGNOSIS — D649 Anemia, unspecified: Secondary | ICD-10-CM | POA: Diagnosis not present

## 2015-01-17 DIAGNOSIS — G20 Parkinson's disease: Secondary | ICD-10-CM | POA: Diagnosis not present

## 2015-01-18 ENCOUNTER — Encounter: Payer: Self-pay | Admitting: Physical Medicine & Rehabilitation

## 2015-01-18 ENCOUNTER — Encounter: Payer: Medicare Other | Attending: Physical Medicine & Rehabilitation | Admitting: Physical Medicine & Rehabilitation

## 2015-01-18 VITALS — BP 105/54 | HR 77 | Resp 14

## 2015-01-18 DIAGNOSIS — M545 Low back pain, unspecified: Secondary | ICD-10-CM

## 2015-01-18 DIAGNOSIS — G20 Parkinson's disease: Secondary | ICD-10-CM | POA: Diagnosis not present

## 2015-01-18 DIAGNOSIS — G8929 Other chronic pain: Secondary | ICD-10-CM

## 2015-01-18 DIAGNOSIS — L03116 Cellulitis of left lower limb: Secondary | ICD-10-CM | POA: Diagnosis not present

## 2015-01-18 NOTE — Patient Instructions (Signed)
THINK ABOUT SAFETY, POSTURE, HEAT (ICE) AT HOME  TAKE YOUR TIME!!!!!!

## 2015-01-18 NOTE — Progress Notes (Signed)
Subjective:    Patient ID: [name], female    DOB: [birth year], 74 y.o.   MRN: [id number]  HPI   Mrs. Qualley is here in follow up of her rehab stay. She was admitted with deconditioning and pseudo-exacerbation of her PD. Overall she's been doing fairly well. She hasn't fallen but has had a few "near misses" when she lost balance while changing directions.   She is still receiving home health therapy who is working on balance, speech, safety.   Her back is bothering when she sits or stands in one position for too long. Her back has been bothering her since the 90's she says. It does not radiate. It feels better when she lays down on her back. She uses oxycodone to help with more severe pain. She doesn't use any heat or ice.   She's eating fairly well (smaller meals due to [facility]). Her bowels and bladder appear to be fairly regular. She continues on the senna-s currently.    Pain Inventory Average Pain 5 Pain Right Now 3 My pain is intermittent and burning  In the last 24 hours, has pain interfered with the following? General activity 8 Relation with others 8 Enjoyment of life 9 What TIME of day is your pain at its worst? morning, night  Sleep (in general) Poor  Pain is worse with: sitting and inactivity Pain improves with: medication Relief from Meds: 3  Mobility walk with assistance use a walker ability to climb steps?  no do you drive?  no use a wheelchair  Function not employed: date last employed . disabled: date disabled .  Neuro/Psych trouble walking  Prior Studies Any changes since last visit?  no hospital f/u  Physicians involved in your care Any changes since last visit?  no hospital f/u   Family History  Problem Relation Age of Onset  . GER disease Mother   . Heart disease Father    History   Social History  . Marital Status: Single    Spouse Name: N/A  . Number of Children: 0  . Years of Education: 12   Occupational History  .     Retired   Social History Main Topics  . Smoking status: Never Smoker   . Smokeless tobacco: Never Used  . Alcohol Use: No  . Drug Use: No  . Sexual Activity: No   Other Topics Concern  . None   Social History Narrative   Patient lives at home alone. Patient is retired. Patient has high school education.   Caffeine- one daily.   Right handed.   Past Surgical History  Procedure Laterality Date  . Hip arthroscopy w/ labral repair    . Thyroidectomy    . Esophagogastroduodenoscopy  09/26/2011    Procedure: ESOPHAGOGASTRODUODENOSCOPY (EGD);  Surgeon: Zenovia Jarred, MD;  Location: [facility] ENDOSCOPY;  Service: Gastroenterology;  Laterality: N/A;  To be done at bedside.  . Esophagogastroduodenoscopy N/A 09/30/2013    Procedure: ESOPHAGOGASTRODUODENOSCOPY (EGD);  Surgeon: [name], MD;  Location: [place];  Service: Endoscopy;  Laterality: N/A;  . Esophagogastroduodenoscopy N/A 11/25/2013    Procedure: ESOPHAGOGASTRODUODENOSCOPY (EGD);  Surgeon: [name], MD;  Location: [facility];  Service: Endoscopy;  Laterality: N/A;   Past Medical History  Diagnosis Date  . Parkinson's disease   . Hypertension   . Arthritis   . Anemia   . Cellulitis of lower leg 12/24/2011  . Acute bronchitis   . Other abnormal blood chemistry   . Chronic systolic  heart failure   . Acute on chronic systolic heart failure   . Cellulitis and abscess of leg, except foot   . Hypotension, unspecified   . Helicobacter pylori (H. pylori)   . Acute gastric ulcer with hemorrhage, without mention of obstruction   . CHF (congestive heart failure)   . Anxiety    BP 105/54 mmHg  Pulse 77  Resp 14  SpO2 95%  Opioid Risk Score:   Fall Risk Score:  `1  Depression screen PHQ 2/9  Depression screen [facility] 2/9 08/25/2014 07/21/2014 08/02/2013  Decreased Interest 0 0 0  Down, Depressed, Hopeless 0 0 0  PHQ - 2 Score 0 0 0     Review of Systems  Musculoskeletal: Positive for gait problem.  All other systems  reviewed and are negative.      Objective:   Physical Exam  HENT: dentition fair Head: Normocephalic.  Eyes: EOM are normal.  Neck: Normal range of motion. Neck supple. No thyromegaly present.  Cardiovascular: Normal rate and regular rhythm.  Respiratory: Effort normal and breath sounds normal. No respiratory distress.  GI: Soft. Bowel sounds are normal. She exhibits no distension.  Neurological: She is alert.   Speech is intelligible. Continued pill rolling tremor in bilateral UE. Perssistent essential tremor in both legs. Speech halting but remainsintelligible.  stands with head forward position particularly when standing Skin:  Erythema over anterior shin lower extremities at baseline. Skin warm, no breakdown. M/S: low back with generalized tenderness. Sits with lean to right and forward while in chair.  Psych: pleasant and appropriate  Assessment/Plan:   1. Functional deficits secondary to pseudo-exacerbation of Parkinson's disease 2. DVT Prophylaxis/Anticoagulation: SCDs. Monitor for any signs of DVT.   3. Pain Management: Oxycodone as needed per Dr. Nelva Bush.  -ice/heat were also discussed today for back pain 4. Mood/anxiety. Ativan 0.5 mg as needed. 5. Neuropsych: This patient is capable of making decisions on her own behalf. 6. Skin/Wound Care: Routine skin checks 7. Fluids/Electrolytes/Nutrition: encouraged adequate PO 8. Parkinson's disease. Continue Mirapex 1.5 mg 3 times a day as well as Sinemet 4 times a day

## 2015-01-19 DIAGNOSIS — I5030 Unspecified diastolic (congestive) heart failure: Secondary | ICD-10-CM | POA: Diagnosis not present

## 2015-01-19 DIAGNOSIS — D649 Anemia, unspecified: Secondary | ICD-10-CM | POA: Diagnosis not present

## 2015-01-19 DIAGNOSIS — M1990 Unspecified osteoarthritis, unspecified site: Secondary | ICD-10-CM | POA: Diagnosis not present

## 2015-01-19 DIAGNOSIS — I10 Essential (primary) hypertension: Secondary | ICD-10-CM | POA: Diagnosis not present

## 2015-01-19 DIAGNOSIS — G20 Parkinson's disease: Secondary | ICD-10-CM | POA: Diagnosis not present

## 2015-01-20 ENCOUNTER — Telehealth: Payer: Self-pay | Admitting: *Deleted

## 2015-01-20 DIAGNOSIS — M1990 Unspecified osteoarthritis, unspecified site: Secondary | ICD-10-CM | POA: Diagnosis not present

## 2015-01-20 DIAGNOSIS — I10 Essential (primary) hypertension: Secondary | ICD-10-CM | POA: Diagnosis not present

## 2015-01-20 DIAGNOSIS — D649 Anemia, unspecified: Secondary | ICD-10-CM | POA: Diagnosis not present

## 2015-01-20 DIAGNOSIS — I5030 Unspecified diastolic (congestive) heart failure: Secondary | ICD-10-CM | POA: Diagnosis not present

## 2015-01-20 DIAGNOSIS — G20 Parkinson's disease: Secondary | ICD-10-CM | POA: Diagnosis not present

## 2015-01-20 NOTE — Telephone Encounter (Signed)
Yes it was a manual BP Check and patient was having severe tremors. No other visual symptoms. Another therapist went back out to recheck BP. Patient doing ok.

## 2015-01-20 NOTE — Telephone Encounter (Signed)
[name] with [facility] called and stated that patient's BP was 163/103 with mild back pain. Pulse 99. Patient is having trouble moving and has tremors. Please Advise.

## 2015-01-20 NOTE — Telephone Encounter (Signed)
Was that a manual bp check?  I suspect that it's hard to get a reliable reading with her severe tremors.  Was she having any headache, visual changes, chest pain, or shortness of breath?  If not, I would simply recheck and see if it improves.  She has pain medications for her back pain.  Sometimes she does not take them when she should b/c of fears of side effects and falls which are not unfounded.

## 2015-01-24 DIAGNOSIS — I10 Essential (primary) hypertension: Secondary | ICD-10-CM | POA: Diagnosis not present

## 2015-01-24 DIAGNOSIS — M1990 Unspecified osteoarthritis, unspecified site: Secondary | ICD-10-CM | POA: Diagnosis not present

## 2015-01-24 DIAGNOSIS — D649 Anemia, unspecified: Secondary | ICD-10-CM | POA: Diagnosis not present

## 2015-01-24 DIAGNOSIS — G20 Parkinson's disease: Secondary | ICD-10-CM | POA: Diagnosis not present

## 2015-01-24 DIAGNOSIS — I5030 Unspecified diastolic (congestive) heart failure: Secondary | ICD-10-CM | POA: Diagnosis not present

## 2015-01-25 DIAGNOSIS — G20 Parkinson's disease: Secondary | ICD-10-CM | POA: Diagnosis not present

## 2015-01-25 DIAGNOSIS — I5030 Unspecified diastolic (congestive) heart failure: Secondary | ICD-10-CM | POA: Diagnosis not present

## 2015-01-25 DIAGNOSIS — D649 Anemia, unspecified: Secondary | ICD-10-CM | POA: Diagnosis not present

## 2015-01-25 DIAGNOSIS — M1990 Unspecified osteoarthritis, unspecified site: Secondary | ICD-10-CM | POA: Diagnosis not present

## 2015-01-25 DIAGNOSIS — I10 Essential (primary) hypertension: Secondary | ICD-10-CM | POA: Diagnosis not present

## 2015-01-26 DIAGNOSIS — M1990 Unspecified osteoarthritis, unspecified site: Secondary | ICD-10-CM | POA: Diagnosis not present

## 2015-01-26 DIAGNOSIS — D649 Anemia, unspecified: Secondary | ICD-10-CM | POA: Diagnosis not present

## 2015-01-26 DIAGNOSIS — I10 Essential (primary) hypertension: Secondary | ICD-10-CM | POA: Diagnosis not present

## 2015-01-26 DIAGNOSIS — G20 Parkinson's disease: Secondary | ICD-10-CM | POA: Diagnosis not present

## 2015-01-26 DIAGNOSIS — I5030 Unspecified diastolic (congestive) heart failure: Secondary | ICD-10-CM | POA: Diagnosis not present

## 2015-01-27 DIAGNOSIS — I5030 Unspecified diastolic (congestive) heart failure: Secondary | ICD-10-CM | POA: Diagnosis not present

## 2015-01-27 DIAGNOSIS — M1990 Unspecified osteoarthritis, unspecified site: Secondary | ICD-10-CM | POA: Diagnosis not present

## 2015-01-27 DIAGNOSIS — G20 Parkinson's disease: Secondary | ICD-10-CM | POA: Diagnosis not present

## 2015-01-27 DIAGNOSIS — I10 Essential (primary) hypertension: Secondary | ICD-10-CM | POA: Diagnosis not present

## 2015-01-27 DIAGNOSIS — D649 Anemia, unspecified: Secondary | ICD-10-CM | POA: Diagnosis not present

## 2015-01-30 ENCOUNTER — Other Ambulatory Visit: Payer: Self-pay

## 2015-01-30 DIAGNOSIS — G20 Parkinson's disease: Secondary | ICD-10-CM | POA: Diagnosis not present

## 2015-01-30 DIAGNOSIS — I5030 Unspecified diastolic (congestive) heart failure: Secondary | ICD-10-CM | POA: Diagnosis not present

## 2015-01-30 DIAGNOSIS — I10 Essential (primary) hypertension: Secondary | ICD-10-CM | POA: Diagnosis not present

## 2015-01-30 DIAGNOSIS — D649 Anemia, unspecified: Secondary | ICD-10-CM | POA: Diagnosis not present

## 2015-01-30 DIAGNOSIS — M1990 Unspecified osteoarthritis, unspecified site: Secondary | ICD-10-CM | POA: Diagnosis not present

## 2015-01-30 MED ORDER — CARBIDOPA-LEVODOPA 25-100 MG PO TABS: ORAL_TABLET | ORAL | Status: DC

## 2015-02-01 DIAGNOSIS — G20 Parkinson's disease: Secondary | ICD-10-CM | POA: Diagnosis not present

## 2015-02-01 DIAGNOSIS — I5030 Unspecified diastolic (congestive) heart failure: Secondary | ICD-10-CM | POA: Diagnosis not present

## 2015-02-01 DIAGNOSIS — D649 Anemia, unspecified: Secondary | ICD-10-CM | POA: Diagnosis not present

## 2015-02-01 DIAGNOSIS — I10 Essential (primary) hypertension: Secondary | ICD-10-CM | POA: Diagnosis not present

## 2015-02-01 DIAGNOSIS — M1990 Unspecified osteoarthritis, unspecified site: Secondary | ICD-10-CM | POA: Diagnosis not present

## 2015-02-03 DIAGNOSIS — I5030 Unspecified diastolic (congestive) heart failure: Secondary | ICD-10-CM | POA: Diagnosis not present

## 2015-02-03 DIAGNOSIS — G20 Parkinson's disease: Secondary | ICD-10-CM | POA: Diagnosis not present

## 2015-02-03 DIAGNOSIS — M1990 Unspecified osteoarthritis, unspecified site: Secondary | ICD-10-CM | POA: Diagnosis not present

## 2015-02-03 DIAGNOSIS — D649 Anemia, unspecified: Secondary | ICD-10-CM | POA: Diagnosis not present

## 2015-02-03 DIAGNOSIS — I10 Essential (primary) hypertension: Secondary | ICD-10-CM | POA: Diagnosis not present

## 2015-02-06 ENCOUNTER — Telehealth: Payer: Self-pay | Admitting: Neurology

## 2015-02-06 DIAGNOSIS — G20 Parkinson's disease: Secondary | ICD-10-CM | POA: Diagnosis not present

## 2015-02-06 DIAGNOSIS — I5030 Unspecified diastolic (congestive) heart failure: Secondary | ICD-10-CM | POA: Diagnosis not present

## 2015-02-06 DIAGNOSIS — M1990 Unspecified osteoarthritis, unspecified site: Secondary | ICD-10-CM | POA: Diagnosis not present

## 2015-02-06 DIAGNOSIS — I10 Essential (primary) hypertension: Secondary | ICD-10-CM | POA: Diagnosis not present

## 2015-02-06 DIAGNOSIS — D649 Anemia, unspecified: Secondary | ICD-10-CM | POA: Diagnosis not present

## 2015-02-06 NOTE — Telephone Encounter (Signed)
Pt called and stated that meds were changed and they are not working.  Pt stated that she is having more trouble than before.

## 2015-02-06 NOTE — Telephone Encounter (Signed)
Diana: Pls call pt:  Please advise patient to take Sinemet 1 pill at 6 AM, 1-1/2 pills at 9 AM, 1 pill at 12 noon, 1-1/2 pills at 3 PM, 1 pill at 6 PM and 1-1/2 pills at 9 PM. We will stop the Parcopa (which is the new Rx I provided at the last visit: the orally disintegrating Sinemet). Instead, we will increase the Sinemet by alternating 1 pill with 1-1/2 pills as outlined.

## 2015-02-07 DIAGNOSIS — D649 Anemia, unspecified: Secondary | ICD-10-CM | POA: Diagnosis not present

## 2015-02-07 DIAGNOSIS — M1990 Unspecified osteoarthritis, unspecified site: Secondary | ICD-10-CM

## 2015-02-07 DIAGNOSIS — I10 Essential (primary) hypertension: Secondary | ICD-10-CM | POA: Diagnosis not present

## 2015-02-07 DIAGNOSIS — I5022 Chronic systolic (congestive) heart failure: Secondary | ICD-10-CM | POA: Diagnosis not present

## 2015-02-07 DIAGNOSIS — I5030 Unspecified diastolic (congestive) heart failure: Secondary | ICD-10-CM | POA: Diagnosis not present

## 2015-02-07 NOTE — Telephone Encounter (Signed)
I spoke to patient and gave her below information. We went over the schedule several times to make sure she understood. With patient's permission, I also called [place] (in home PT) who is helping manage medications. I gave her the new medication information and she states she will make sure [facility] understands.

## 2015-02-10 DIAGNOSIS — I5030 Unspecified diastolic (congestive) heart failure: Secondary | ICD-10-CM | POA: Diagnosis not present

## 2015-02-10 DIAGNOSIS — I10 Essential (primary) hypertension: Secondary | ICD-10-CM | POA: Diagnosis not present

## 2015-02-10 DIAGNOSIS — M1990 Unspecified osteoarthritis, unspecified site: Secondary | ICD-10-CM | POA: Diagnosis not present

## 2015-02-10 DIAGNOSIS — G20 Parkinson's disease: Secondary | ICD-10-CM | POA: Diagnosis not present

## 2015-02-10 DIAGNOSIS — D649 Anemia, unspecified: Secondary | ICD-10-CM | POA: Diagnosis not present

## 2015-02-14 ENCOUNTER — Telehealth: Payer: Self-pay | Admitting: *Deleted

## 2015-02-14 DIAGNOSIS — D649 Anemia, unspecified: Secondary | ICD-10-CM | POA: Diagnosis not present

## 2015-02-14 DIAGNOSIS — M1990 Unspecified osteoarthritis, unspecified site: Secondary | ICD-10-CM | POA: Diagnosis not present

## 2015-02-14 DIAGNOSIS — G20 Parkinson's disease: Secondary | ICD-10-CM | POA: Diagnosis not present

## 2015-02-14 DIAGNOSIS — I5030 Unspecified diastolic (congestive) heart failure: Secondary | ICD-10-CM | POA: Diagnosis not present

## 2015-02-14 DIAGNOSIS — I10 Essential (primary) hypertension: Secondary | ICD-10-CM | POA: Diagnosis not present

## 2015-02-14 NOTE — Telephone Encounter (Signed)
[name] ST called to get re certification for another 60 days.  Approval given.

## 2015-02-15 DIAGNOSIS — I5030 Unspecified diastolic (congestive) heart failure: Secondary | ICD-10-CM | POA: Diagnosis not present

## 2015-02-15 DIAGNOSIS — I10 Essential (primary) hypertension: Secondary | ICD-10-CM | POA: Diagnosis not present

## 2015-02-15 DIAGNOSIS — G20 Parkinson's disease: Secondary | ICD-10-CM | POA: Diagnosis not present

## 2015-02-15 DIAGNOSIS — M1990 Unspecified osteoarthritis, unspecified site: Secondary | ICD-10-CM | POA: Diagnosis not present

## 2015-02-15 DIAGNOSIS — D649 Anemia, unspecified: Secondary | ICD-10-CM | POA: Diagnosis not present

## 2015-02-17 DIAGNOSIS — I5030 Unspecified diastolic (congestive) heart failure: Secondary | ICD-10-CM | POA: Diagnosis not present

## 2015-02-17 DIAGNOSIS — G20 Parkinson's disease: Secondary | ICD-10-CM | POA: Diagnosis not present

## 2015-02-17 DIAGNOSIS — I5022 Chronic systolic (congestive) heart failure: Secondary | ICD-10-CM | POA: Diagnosis not present

## 2015-02-17 DIAGNOSIS — M1990 Unspecified osteoarthritis, unspecified site: Secondary | ICD-10-CM | POA: Diagnosis not present

## 2015-02-17 DIAGNOSIS — I10 Essential (primary) hypertension: Secondary | ICD-10-CM | POA: Diagnosis not present

## 2015-02-17 DIAGNOSIS — D649 Anemia, unspecified: Secondary | ICD-10-CM | POA: Diagnosis not present

## 2015-02-21 DIAGNOSIS — M1990 Unspecified osteoarthritis, unspecified site: Secondary | ICD-10-CM | POA: Diagnosis not present

## 2015-02-21 DIAGNOSIS — I5030 Unspecified diastolic (congestive) heart failure: Secondary | ICD-10-CM | POA: Diagnosis not present

## 2015-02-21 DIAGNOSIS — D649 Anemia, unspecified: Secondary | ICD-10-CM | POA: Diagnosis not present

## 2015-02-21 DIAGNOSIS — I5022 Chronic systolic (congestive) heart failure: Secondary | ICD-10-CM | POA: Diagnosis not present

## 2015-02-21 DIAGNOSIS — I10 Essential (primary) hypertension: Secondary | ICD-10-CM | POA: Diagnosis not present

## 2015-02-21 DIAGNOSIS — G20 Parkinson's disease: Secondary | ICD-10-CM | POA: Diagnosis not present

## 2015-02-23 DIAGNOSIS — H40022 Open angle with borderline findings, high risk, left eye: Secondary | ICD-10-CM | POA: Diagnosis not present

## 2015-02-23 DIAGNOSIS — H11153 Pinguecula, bilateral: Secondary | ICD-10-CM | POA: Diagnosis not present

## 2015-02-23 DIAGNOSIS — H2513 Age-related nuclear cataract, bilateral: Secondary | ICD-10-CM | POA: Diagnosis not present

## 2015-02-23 DIAGNOSIS — H52223 Regular astigmatism, bilateral: Secondary | ICD-10-CM | POA: Diagnosis not present

## 2015-02-23 DIAGNOSIS — H43812 Vitreous degeneration, left eye: Secondary | ICD-10-CM | POA: Diagnosis not present

## 2015-02-23 DIAGNOSIS — H5202 Hypermetropia, left eye: Secondary | ICD-10-CM | POA: Diagnosis not present

## 2015-02-23 DIAGNOSIS — H18413 Arcus senilis, bilateral: Secondary | ICD-10-CM | POA: Diagnosis not present

## 2015-02-28 DIAGNOSIS — I5030 Unspecified diastolic (congestive) heart failure: Secondary | ICD-10-CM | POA: Diagnosis not present

## 2015-02-28 DIAGNOSIS — D649 Anemia, unspecified: Secondary | ICD-10-CM | POA: Diagnosis not present

## 2015-02-28 DIAGNOSIS — G20 Parkinson's disease: Secondary | ICD-10-CM | POA: Diagnosis not present

## 2015-02-28 DIAGNOSIS — M1990 Unspecified osteoarthritis, unspecified site: Secondary | ICD-10-CM | POA: Diagnosis not present

## 2015-02-28 DIAGNOSIS — I5022 Chronic systolic (congestive) heart failure: Secondary | ICD-10-CM | POA: Diagnosis not present

## 2015-02-28 DIAGNOSIS — I10 Essential (primary) hypertension: Secondary | ICD-10-CM | POA: Diagnosis not present

## 2015-03-07 ENCOUNTER — Telehealth: Payer: Self-pay | Admitting: Neurology

## 2015-03-07 ENCOUNTER — Ambulatory Visit: Payer: Medicare Other | Admitting: Neurology

## 2015-03-07 NOTE — Telephone Encounter (Signed)
Patient no showed for an appointment today.

## 2015-03-10 DIAGNOSIS — M1990 Unspecified osteoarthritis, unspecified site: Secondary | ICD-10-CM | POA: Diagnosis not present

## 2015-03-10 DIAGNOSIS — D649 Anemia, unspecified: Secondary | ICD-10-CM | POA: Diagnosis not present

## 2015-03-10 DIAGNOSIS — I5030 Unspecified diastolic (congestive) heart failure: Secondary | ICD-10-CM | POA: Diagnosis not present

## 2015-03-10 DIAGNOSIS — I5022 Chronic systolic (congestive) heart failure: Secondary | ICD-10-CM | POA: Diagnosis not present

## 2015-03-10 DIAGNOSIS — I10 Essential (primary) hypertension: Secondary | ICD-10-CM | POA: Diagnosis not present

## 2015-03-10 DIAGNOSIS — G20 Parkinson's disease: Secondary | ICD-10-CM | POA: Diagnosis not present

## 2015-03-13 ENCOUNTER — Telehealth: Payer: Self-pay

## 2015-03-13 DIAGNOSIS — I10 Essential (primary) hypertension: Secondary | ICD-10-CM | POA: Diagnosis not present

## 2015-03-13 DIAGNOSIS — D649 Anemia, unspecified: Secondary | ICD-10-CM | POA: Diagnosis not present

## 2015-03-13 DIAGNOSIS — I5022 Chronic systolic (congestive) heart failure: Secondary | ICD-10-CM | POA: Diagnosis not present

## 2015-03-13 DIAGNOSIS — M1990 Unspecified osteoarthritis, unspecified site: Secondary | ICD-10-CM

## 2015-03-13 DIAGNOSIS — I5030 Unspecified diastolic (congestive) heart failure: Secondary | ICD-10-CM | POA: Diagnosis not present

## 2015-03-13 NOTE — Telephone Encounter (Signed)
I spoke to patient earlier today about possible moving appt to 1pm tomorrow (pt needs 15min appt). She stated that she would call her ride and let me know. [name] has not called back and I have tried calling her back several times but her phone is busy. I am just waiting for her to call back.

## 2015-03-14 ENCOUNTER — Telehealth: Payer: Self-pay | Admitting: Neurology

## 2015-03-14 ENCOUNTER — Ambulatory Visit: Payer: Medicare Other | Admitting: Neurology

## 2015-03-14 ENCOUNTER — Encounter: Payer: Self-pay | Admitting: Nurse Practitioner

## 2015-03-14 ENCOUNTER — Ambulatory Visit (INDEPENDENT_AMBULATORY_CARE_PROVIDER_SITE_OTHER): Payer: Medicare Other | Admitting: Nurse Practitioner

## 2015-03-14 VITALS — BP 116/90 | HR 80 | Ht 64.0 in | Wt 180.0 lb

## 2015-03-14 DIAGNOSIS — G20 Parkinson's disease: Secondary | ICD-10-CM

## 2015-03-14 DIAGNOSIS — R269 Unspecified abnormalities of gait and mobility: Secondary | ICD-10-CM

## 2015-03-14 NOTE — Telephone Encounter (Signed)
Ebony Hail with [place] @[phone] is calling about patient to let us know that her BP is 163/97 which is elevated for her.  Also, her tremors are not better with the new medication. Just FYI as patient has an appt today.  Thanks.

## 2015-03-14 NOTE — Progress Notes (Addendum)
[facility]  PATIENT: [name] DOB: 08-Apr-1941   REASON FOR VISIT: Follow-up for Parkinson's disease, dyskinesias,  fall risk HISTORY FROM: Patient    HISTORY OF PRESENT ILLNESS: Ms. [name], 74 year old female returns for follow-up. She was last seen in this office by Dr. Rexene Alberts 11/01/2014. Her Parkinson symptoms date back to 1999 when she had left hand stiffness gait changes and fine motor dyscontrol. She is recently received some physical therapy, and is currently receiving speech therapy which she finds beneficial. She was one  hour late for her appointment today due to transportation issues. She remains on carbidopa levodopa 25/100 IR at 6 AM 9 AM 12 noon, 3 PM, 6 PM and 9 PM. She is also on Mirapex. She obtains Meals on Wheels she denies any recent falls. She continues to live alone but her brother checks on her frequently. She has a life alert she has generalized dyskinesias which are better than when I last saw her May of 2015. She denies any difficulty sleeping or hallucinations. She returns for reevaluation .   HISTORY:Ms. [name] is a 74 year old right-handed woman with an underlying medical history of hypertension, arthritis, anemia, cellulitis, chronic systolic heart failure, peptic ulcer disease, and chronic low back pain with history of T12 compression fracture, status post kyphoplasty in October 2010, who presents for follow-up consultation of her Parkinson's disease, complicated by motor complications, motor fluctuations and falls. The patient is unaccompanied today and was dropped off by transportation. This is her first visit with me and she previously saw Dr. Jim Like on 02/28/2014, at which time she was kept on levodopa at the current dose and Mirapex at the current dose. Prior to that she used to see Dr. Krista Blue (05/25/13) and has also seen our nurse practitioner, Cecille Rubin, last on 01/28/14. Her symptoms date back to 1999 when she first noticed L hand  stiffness, fine motor dyscontrol and gait changes. Today, she reports, feeling worse with more freezing. She fell last night but thankfully did not injure herself. She was admitted to the hospital from 08/06/14 to 08/10/14 for N/V/D.  She lives alone. She has 5 brothers and 1 sister, 1 brother lives in [place] but otherwise her siblings are close by. One brother brings her supper every night. She gets Meals on Wheels. She has a call alert button. She has no family history of Parkinson's disease. She uses a 2 wheeled walker at the house. She has generalized dyskinesias. She may not drink enough water. She has lower extremity swelling but this has improved. Her short-term memory is getting worse she feels. She denies any hallucinations.    REVIEW OF SYSTEMS: Full 14 system review of systems performed and notable only for those listed, all others are neg:  Constitutional: neg  Cardiovascular: neg Ear/Nose/Throat: neg  Skin: neg Eyes: neg Respiratory: Shortness of breath Gastroitestinal: Constipation  Hematology/Lymphatic: neg  Endocrine: neg Musculoskeletal:neg Allergy/Immunology: neg Neurological: Speech difficulty Psychiatric: neg Sleep : neg   ALLERGIES: Allergies  Allergen Reactions  . Codeine Other (See Comments)    unknown  . Penicillins Other (See Comments)    unknown    HOME MEDICATIONS: Outpatient Prescriptions Prior to Visit  Medication Sig Dispense Refill  . carbidopa-levodopa (SINEMET IR) 25-100 MG per tablet TAKE ONE TABLET BY MOUTH 6TIMES DAILY. TAKE AT 6 AM, 9AM, 12PM, 3PM, 6PM AND 9PM (Patient taking differently: Take 1 tablet at 6am, 12pm, 6pm. Take 1.5 tablet at 9am, 3pm, 9pm.) 180 tablet 5  . Ferrous Sulfate (  IRON) 325 (65 FE) MG TABS Take 1 capsule by mouth daily. 30 each 6  . furosemide (LASIX) 20 MG tablet Take 20 mg by mouth 2 (two) times daily as needed for fluid.     Marland Kitchen gabapentin (NEURONTIN) 100 MG capsule Take 1 capsule (100 mg total) by mouth 3 (three)  times daily. 90 capsule 3  . LORazepam (ATIVAN) 0.5 MG tablet Take 1 tablet (0.5 mg total) by mouth 2 (two) times daily as needed for anxiety. 60 tablet 0  . MAGNESIUM PO Take 1 tablet by mouth daily.    . Multiple Vitamins-Minerals (MULTIVITAMIN WITH MINERALS) tablet Take 1 tablet by mouth daily.    Marland Kitchen oxyCODONE (OXY IR/ROXICODONE) 5 MG immediate release tablet Take 1 tablet (5 mg total) by mouth every 4 (four) hours as needed for moderate pain. 60 tablet 0  . pramipexole (MIRAPEX) 1.5 MG tablet Take 1 tablet (1.5 mg total) by mouth 3 (three) times daily. 90 tablet 5  . senna-docusate (SENOKOT-S) 8.6-50 MG per tablet Take 1 tablet by mouth 2 (two) times daily.    . Tdap (BOOSTRIX) 5-2.5-18.5 LF-MCG/0.5 injection Inject 0.5 mLs into the muscle once.    . zoster vaccine live, PF, (ZOSTAVAX) 61607 UNT/0.65ML injection Inject 0.65 mLs into the skin once.     No facility-administered medications prior to visit.    PAST MEDICAL HISTORY: Past Medical History  Diagnosis Date  . Parkinson's disease   . Hypertension   . Arthritis   . Anemia   . Cellulitis of lower leg 12/24/2011  . Acute bronchitis   . Other abnormal blood chemistry   . Chronic systolic heart failure   . Acute on chronic systolic heart failure   . Cellulitis and abscess of leg, except foot   . Hypotension, unspecified   . Helicobacter pylori (H. pylori)   . Acute gastric ulcer with hemorrhage, without mention of obstruction   . CHF (congestive heart failure)   . Anxiety   . Cataract left  . Glaucoma left    PAST SURGICAL HISTORY: Past Surgical History  Procedure Laterality Date  . Hip arthroscopy w/ labral repair    . Thyroidectomy    . Esophagogastroduodenoscopy  09/26/2011    Procedure: ESOPHAGOGASTRODUODENOSCOPY (EGD);  Surgeon: Zenovia Jarred, MD;  Location: [facility] ENDOSCOPY;  Service: Gastroenterology;  Laterality: N/A;  To be done at bedside.  . Esophagogastroduodenoscopy N/A 09/30/2013    Procedure:  ESOPHAGOGASTRODUODENOSCOPY (EGD);  Surgeon: [name], MD;  Location: [facility];  Service: Endoscopy;  Laterality: N/A;  . Esophagogastroduodenoscopy N/A 11/25/2013    Procedure: ESOPHAGOGASTRODUODENOSCOPY (EGD);  Surgeon: [name], MD;  Location: [facility];  Service: Endoscopy;  Laterality: N/A;    FAMILY HISTORY: Family History  Problem Relation Age of Onset  . GER disease Mother   . Heart disease Father     SOCIAL HISTORY: History   Social History  . Marital Status: Single    Spouse Name: N/A  . Number of Children: 0  . Years of Education: 12   Occupational History  .      Retired   Social History Main Topics  . Smoking status: Never Smoker   . Smokeless tobacco: Never Used  . Alcohol Use: No  . Drug Use: No  . Sexual Activity: No   Other Topics Concern  . Not on file   Social History Narrative   Patient lives at home alone. Patient is retired. Patient has high school education.   Caffeine- one daily.  Right handed.     PHYSICAL EXAM  Filed Vitals:   03/14/15 1448  BP: 116/90  Pulse: 80  Height: 5\' 4"  (1.626 m)  Weight: 180 lb (81.647 kg)   Body mass index is 30.88 kg/(m^2).  Generalized: Well developed, frail appearing female seated in her wheelchair in no acute distress  Head: normocephalic and atraumatic,. Oropharynx benign , mild decreased blink Neck: Supple, no carotid bruits  Cardiac: Regular rate rhythm, no murmur  Musculoskeletal: No deformity   Neurological examination   Mentation: Alert oriented to time, place, history taking. Attention span and concentration appropriate. Recent and remote memory intact.  Follows all commands speech hypophonic and mild dysarthria noted .   Cranial nerve II-XII: Fundoscopic exam reveals sharp disc margins.Pupils were equal round reactive to light extraocular movements were full except for limited upgaze, visual field were full on confrontational test. Facial sensation and strength were  normal. hearing was intact to finger rubbing bilaterally. Uvula tongue midline. head turning and shoulder shrug were normal and symmetric.Tongue protrusion into cheek strength was normal. Motor: normal bulk, mild to moderate generalized dyskinesias noted,  mild rigidity without cogwheeling. There is mild bradykinesia Sensory: normal and symmetric to light touch, pinprick, and  Vibration, proprioception  Coordination: finger-nose-finger, heel-to-shin bilaterally, no dysmetria Reflexes: 1+ in the upper extremities and trace in the lower extremities plantar responses were flexor bilaterally. Gait and Station: In wheelchair and at risk for falls  DIAGNOSTIC DATA (LABS, IMAGING, TESTING) - I reviewed patient records, labs, notes, testing and imaging myself where available.  Lab Results  Component Value Date   WBC 8.3 12/09/2014   HGB 12.1 12/09/2014   HCT 37.8 12/09/2014   MCV 80.8 12/09/2014   PLT 145* 12/09/2014      Component Value Date/Time   NA 138 12/09/2014 0709   NA 142 08/25/2014 1607   K 3.9 12/09/2014 0709   CL 104 12/09/2014 0709   CO2 28 12/09/2014 0709   GLUCOSE 112* 12/09/2014 0709   GLUCOSE 90 08/25/2014 1607   BUN 14 12/09/2014 0709   BUN 15 08/25/2014 1607   CREATININE 0.77 12/09/2014 0709   CALCIUM 8.8 12/09/2014 0709   PROT 5.7* 12/09/2014 0709   PROT 6.4 07/21/2014 1420   ALBUMIN 3.0* 12/09/2014 0709   AST 31 12/09/2014 0709   ALT 6 12/09/2014 0709   ALKPHOS 67 12/09/2014 0709   BILITOT 0.6 12/09/2014 0709   GFRNONAA 81* 12/09/2014 0709   GFRAA >90 12/09/2014 0709     ASSESSMENT AND PLAN  74 y.o. year old female  has a past medical history of Parkinson's disease; Hypertension; Arthritis; Anemia;  Chronic systolic heart failure; Acute  chronic systolic heart failure;  Hypotension, unspecified; Acute gastric ulcer with hemorrhage, without mention of obstruction; CHF (congestive heart failure); Anxiety; here to follow-up for her Parkinson's disease which is  left-sided predominant complicated by dyskinesias and increased fall risk. She is a increased risk for worsening dyskinesias with increase in her Parkinson's medications  Continue Sinemet at the current dose does not need refills Continue Mirapex at current dose Drink plenty of water 6-8 glasses a day Use walker at all times decreased risk for falls Continue speech therapy for hypophonia Follow-up with Dr. Rexene Alberts in 4 months Dennie Bible, [facility], [facility], [facility] [address] [place], [place] [address] [phone]  I reviewed the above note and documentation by the Nurse Practitioner and agree with the history, physical exam, assessment and plan as outlined  above. I was immediately available for face-to-face consultation. Star Age, MD, PhD [facility])  I reviewed the above note and documentation by the Nurse Practitioner and agree with the history, physical exam, assessment and plan as outlined above. I was immediately available for face-to-face consultation. Star Age, MD, PhD [facility])

## 2015-03-14 NOTE — Telephone Encounter (Signed)
Patient is able to come in at 1pm today.

## 2015-03-14 NOTE — Patient Instructions (Signed)
Continue Sinemet at the current dose does not need refills Continue Mirapex at current dose Follow-up with Dr. Rexene Alberts in 4 months

## 2015-03-17 DIAGNOSIS — I10 Essential (primary) hypertension: Secondary | ICD-10-CM | POA: Diagnosis not present

## 2015-03-17 DIAGNOSIS — D649 Anemia, unspecified: Secondary | ICD-10-CM | POA: Diagnosis not present

## 2015-03-17 DIAGNOSIS — I5030 Unspecified diastolic (congestive) heart failure: Secondary | ICD-10-CM | POA: Diagnosis not present

## 2015-03-17 DIAGNOSIS — M1990 Unspecified osteoarthritis, unspecified site: Secondary | ICD-10-CM | POA: Diagnosis not present

## 2015-03-17 DIAGNOSIS — G20 Parkinson's disease: Secondary | ICD-10-CM | POA: Diagnosis not present

## 2015-03-17 DIAGNOSIS — I5022 Chronic systolic (congestive) heart failure: Secondary | ICD-10-CM | POA: Diagnosis not present

## 2015-03-21 DIAGNOSIS — I5030 Unspecified diastolic (congestive) heart failure: Secondary | ICD-10-CM | POA: Diagnosis not present

## 2015-03-21 DIAGNOSIS — G20 Parkinson's disease: Secondary | ICD-10-CM | POA: Diagnosis not present

## 2015-03-21 DIAGNOSIS — D649 Anemia, unspecified: Secondary | ICD-10-CM | POA: Diagnosis not present

## 2015-03-21 DIAGNOSIS — M1990 Unspecified osteoarthritis, unspecified site: Secondary | ICD-10-CM | POA: Diagnosis not present

## 2015-03-21 DIAGNOSIS — I5022 Chronic systolic (congestive) heart failure: Secondary | ICD-10-CM | POA: Diagnosis not present

## 2015-03-21 DIAGNOSIS — I10 Essential (primary) hypertension: Secondary | ICD-10-CM | POA: Diagnosis not present

## 2015-03-22 ENCOUNTER — Telehealth: Payer: Self-pay | Admitting: *Deleted

## 2015-03-22 NOTE — Telephone Encounter (Signed)
Alonna Minium, Speech Therapist [facility], called asking for verbal orders for 1 additional visit to address pt's voicing which is secondary to Parkinson's. I called her back and left a message in her confidential VM box and gave her the verbal order

## 2015-04-03 ENCOUNTER — Ambulatory Visit (INDEPENDENT_AMBULATORY_CARE_PROVIDER_SITE_OTHER): Payer: Medicare Other | Admitting: Internal Medicine

## 2015-04-03 ENCOUNTER — Encounter: Payer: Self-pay | Admitting: Internal Medicine

## 2015-04-03 VITALS — BP 126/78 | HR 62 | Temp 97.4°F | Ht 64.0 in | Wt 168.8 lb

## 2015-04-03 DIAGNOSIS — M545 Low back pain, unspecified: Secondary | ICD-10-CM

## 2015-04-03 DIAGNOSIS — G20 Parkinson's disease: Secondary | ICD-10-CM | POA: Diagnosis not present

## 2015-04-03 DIAGNOSIS — D509 Iron deficiency anemia, unspecified: Secondary | ICD-10-CM | POA: Diagnosis not present

## 2015-04-03 DIAGNOSIS — K5909 Other constipation: Secondary | ICD-10-CM

## 2015-04-03 DIAGNOSIS — K5900 Constipation, unspecified: Secondary | ICD-10-CM | POA: Diagnosis not present

## 2015-04-03 DIAGNOSIS — I5042 Chronic combined systolic (congestive) and diastolic (congestive) heart failure: Secondary | ICD-10-CM | POA: Diagnosis not present

## 2015-04-03 DIAGNOSIS — H409 Unspecified glaucoma: Secondary | ICD-10-CM

## 2015-04-03 DIAGNOSIS — F4323 Adjustment disorder with mixed anxiety and depressed mood: Secondary | ICD-10-CM

## 2015-04-03 DIAGNOSIS — G20A1 Parkinson's disease without dyskinesia, without mention of fluctuations: Secondary | ICD-10-CM

## 2015-04-03 NOTE — Progress Notes (Signed)
Patient ID: [name], female   DOB: [birth year], 74 y.o.   MRN: [id number]   Location:  [facility]  Code Status: DNR Goals of Care: Advanced Directive information Does patient have an advance directive?: No, Does patient want to make changes to advanced directive?: Yes - information given   Chief Complaint  Patient presents with  . Medical Management of Chronic Issues    3 Month Follow up    HPI: Patient is a 74 y.o. white female seen in the office today for medical mgt of chronic diseases.    Parkinson's:  Got up and dressed for appt, but couldn't get legs to move at first so called [place] to say she couldn't come, but they finally worked for her.  Sometimes they hurt when she freezes.  Had a fall on unlevel yard. Did not hurt self.  Was able to get herself back up.  Has one more speech therapy session this week as ordered by neurology.  Has enjoyed it and it's helped her hypophonia.    Low back pain:  Painful all of the time.  Even standing still to do something.  Still not taking the oxycodone very often.      Anxiety pill is somewhat helpful to her when she is worried and in pain.  Has only ever used it once.  Still has some of them--a lot left.    Constipation:  Still has trouble getting bm all the way out.  Is a little better with the stool softeners.  Also takes magnesium.    Anemia:  Takes her iron daily.    Glaucoma:  Also has cataracts.  Is stable using xalatan.  Was able to pass eye test.  Has new glasses with tint.  Does not yet need cataracts removed.    CHF:  Breathing is doing a little better.  Also helped by anxiety.  Edema is good today.  No significant swelling.     Review of Systems:  Review of Systems  Constitutional: Negative for fever and chills.  HENT: Negative for hearing loss.   Eyes: Negative for blurred vision.       Glaucoma and cataracts  Respiratory: Positive for shortness of breath. Negative for cough.     Cardiovascular: Negative for chest pain and leg swelling.  Gastrointestinal: Positive for constipation. Negative for abdominal pain, blood in stool and melena.  Genitourinary: Positive for frequency. Negative for dysuria and urgency.  Musculoskeletal: Positive for back pain and falls.  Neurological: Positive for tremors. Negative for dizziness, loss of consciousness and headaches.       Freezing  Psychiatric/Behavioral: Negative for memory loss. The patient is nervous/anxious.     Past Medical History  Diagnosis Date  . Parkinson's disease   . Hypertension   . Arthritis   . Anemia   . Cellulitis of lower leg 12/24/2011  . Acute bronchitis   . Other abnormal blood chemistry   . Chronic systolic heart failure   . Acute on chronic systolic heart failure   . Cellulitis and abscess of leg, except foot   . Hypotension, unspecified   . Helicobacter pylori (H. pylori)   . Acute gastric ulcer with hemorrhage, without mention of obstruction   . CHF (congestive heart failure)   . Anxiety   . Cataract left  . Glaucoma left    Past Surgical History  Procedure Laterality Date  . Hip arthroscopy w/ labral repair    . Thyroidectomy    .  Esophagogastroduodenoscopy  09/26/2011    Procedure: ESOPHAGOGASTRODUODENOSCOPY (EGD);  Surgeon: Zenovia Jarred, MD;  Location: [facility] ENDOSCOPY;  Service: Gastroenterology;  Laterality: N/A;  To be done at bedside.  . Esophagogastroduodenoscopy N/A 09/30/2013    Procedure: ESOPHAGOGASTRODUODENOSCOPY (EGD);  Surgeon: [name], MD;  Location: [facility];  Service: Endoscopy;  Laterality: N/A;  . Esophagogastroduodenoscopy N/A 11/25/2013    Procedure: ESOPHAGOGASTRODUODENOSCOPY (EGD);  Surgeon: [name], MD;  Location: [facility];  Service: Endoscopy;  Laterality: N/A;    Allergies  Allergen Reactions  . Codeine Other (See Comments)    unknown  . Penicillins Other (See Comments)    unknown   Medications: Patient's Medications  New Prescriptions    No medications on file  Previous Medications   CARBIDOPA-LEVODOPA (SINEMET IR) 25-100 MG PER TABLET    Take 1 tablet by mouth 6 (six) times daily. Take at 6a, 9a, 12n, 3p, 6pm, 9pm   FERROUS SULFATE (IRON) 325 (65 FE) MG TABS    Take 1 capsule by mouth daily.   FUROSEMIDE (LASIX) 20 MG TABLET    Take 20 mg by mouth 2 (two) times daily as needed for fluid.    GABAPENTIN (NEURONTIN) 100 MG CAPSULE    Take 1 capsule (100 mg total) by mouth 3 (three) times daily.   LATANOPROST (XALATAN) 0.005 % OPHTHALMIC SOLUTION       LORAZEPAM (ATIVAN) 0.5 MG TABLET    Take 1 tablet (0.5 mg total) by mouth 2 (two) times daily as needed for anxiety.   MAGNESIUM PO    Take 1 tablet by mouth daily.   MULTIPLE VITAMINS-MINERALS (MULTIVITAMIN WITH MINERALS) TABLET    Take 1 tablet by mouth daily.   OXYCODONE (OXY IR/ROXICODONE) 5 MG IMMEDIATE RELEASE TABLET    Take 1 tablet (5 mg total) by mouth every 4 (four) hours as needed for moderate pain.   PRAMIPEXOLE (MIRAPEX) 1.5 MG TABLET    Take 1 tablet (1.5 mg total) by mouth 3 (three) times daily.   SENNA-DOCUSATE (SENOKOT-S) 8.6-50 MG PER TABLET    Take 1 tablet by mouth 2 (two) times daily.   TDAP (BOOSTRIX) 5-2.5-18.5 LF-MCG/0.5 INJECTION    Inject 0.5 mLs into the muscle once.   ZOSTER VACCINE LIVE, PF, (ZOSTAVAX) 56387 UNT/0.65ML INJECTION    Inject 0.65 mLs into the skin once.  Modified Medications   No medications on file  Discontinued Medications   No medications on file    Physical Exam: Filed Vitals:   04/03/15 1314  BP: 126/78  Pulse: 62  Temp: 97.4 F (36.3 C)  TempSrc: Oral  Height: 5\' 4"  (1.626 m)  Weight: 168 lb 12.8 oz (76.567 kg)   Physical Exam  Constitutional: She is oriented to person, place, and time. No distress.  Chronically ill appearing, disheveled white female walking slowly with rollator walker--shuffling gait  Cardiovascular: Normal rate, regular rhythm, normal heart sounds and intact distal pulses.   Pulmonary/Chest: Effort  normal and breath sounds normal. No respiratory distress.  Abdominal: Soft. Bowel sounds are normal.  Musculoskeletal:  Scoliosis; low back tenderness  Neurological: She is alert and oriented to person, place, and time. She exhibits abnormal muscle tone.  Resting tremor; still with stuttering speech but does seem to be improving with ST  Skin: Skin is warm and dry.  Has seborrheic dermatitis of face, scalp  Psychiatric: She has a normal mood and affect.   Labs reviewed: Basic Metabolic Panel:  Recent Labs  12/07/14 0750 12/08/14 0514 12/09/14 5643  NA 139 139 138  K 3.5 4.1 3.9  CL 107 107 104  CO2 26 25 28   GLUCOSE 107* 131* 112*  BUN 12 14 14   CREATININE 0.71 0.81 0.77  CALCIUM 8.4 8.5 8.8   Liver Function Tests:  Recent Labs  08/08/14 0600 12/07/14 0750 12/09/14 0709  AST 17 26 31   ALT 8 18 6   ALKPHOS 58 69 67  BILITOT 0.4 1.2 0.6  PROT 5.2* 5.8* 5.7*  ALBUMIN 2.6* 3.3* 3.0*   No results for input(s): LIPASE, AMYLASE in the last 8760 hours. No results for input(s): AMMONIA in the last 8760 hours. CBC:  Recent Labs  12/07/14 0407 12/07/14 0750 12/08/14 0514 12/09/14 0709  WBC 9.0 9.6 7.1 8.3  NEUTROABS 6.8 7.7  --  6.2  HGB 11.6* 11.7* 11.1* 12.1  HCT 36.9 36.7 35.6* 37.8  MCV 80.6 80.3 81.8 80.8  PLT 153 149* 133* 145*    Assessment/Plan 1. Parkinson's disease - cont sinemet and mirapex -use walker at all times -had one fall since I saw her last -needs help now with grocery shopping--given senior resource phone number for [place]--she already gets meals on wheels and senior transportation - Comprehensive metabolic panel  2. Anemia, iron deficiency - cont iron supplement - CBC with Differential/Platelet - Comprehensive metabolic panel  3. Chronic combined systolic and diastolic congestive heart failure - cont lasix, elevating legs at rest, avoiding high sodium foods - Comprehensive metabolic panel  4. Chronic constipation - cont  senna s two daily--I have tried her on numerous other regimens, but she does not take them as prescribed  - Comprehensive metabolic panel  5. Midline low back pain without sciatica -cont oxycodone for severe pain--advised to take more when she needs it, but she opts not to due to drowsiness and weaker meds don't work, can't take nsaids due to gi bleeding - Comprehensive metabolic panel  6. Adjustment disorder with mixed anxiety and depressed mood - cont lorazepam, has been on antidepressants but these have been stopped at one of her numerous ED/hospitalizations, and never restarted  7. Glaucoma -cont latanoprost and ophtho f/u re: cataracts as they mature   Labs/tests ordered: Orders Placed This Encounter  Procedures  . CBC with Differential/Platelet  . Comprehensive metabolic panel    Next appt:  3 mos Lenia Housley L. Primo Innis, D.O. [facility] [address], [place] [address] Cell Phone (Mon-Fri 8am-5pm):  [phone] On Call:  [phone] & follow prompts after 5pm & weekends Office Phone:  [phone] Office Fax:  [phone]

## 2015-04-04 LAB — CBC WITH DIFFERENTIAL/PLATELET
Basophils Absolute: 0.1 10*3/uL (ref 0.0–0.2)
Basos: 1 %
EOS (ABSOLUTE): 0.2 10*3/uL (ref 0.0–0.4)
Eos: 2 %
Hematocrit: 38.2 % (ref 34.0–46.6)
Hemoglobin: 12.3 g/dL (ref 11.1–15.9)
Immature Grans (Abs): 0 10*3/uL (ref 0.0–0.1)
Immature Granulocytes: 0 %
Lymphocytes Absolute: 1.1 10*3/uL (ref 0.7–3.1)
Lymphs: 14 %
MCH: 27 pg (ref 26.6–33.0)
MCHC: 32.2 g/dL (ref 31.5–35.7)
MCV: 84 fL (ref 79–97)
Monocytes Absolute: 0.5 10*3/uL (ref 0.1–0.9)
Monocytes: 7 %
Neutrophils Absolute: 5.6 10*3/uL (ref 1.4–7.0)
Neutrophils: 76 %
Platelets: 136 10*3/uL — ABNORMAL LOW (ref 150–379)
RBC: 4.56 x10E6/uL (ref 3.77–5.28)
RDW: 16.4 % — ABNORMAL HIGH (ref 12.3–15.4)
WBC: 7.4 10*3/uL (ref 3.4–10.8)

## 2015-04-04 LAB — COMPREHENSIVE METABOLIC PANEL
ALT: 7 IU/L (ref 0–32)
AST: 18 IU/L (ref 0–40)
Albumin/Globulin Ratio: 1.9 (ref 1.1–2.5)
Albumin: 3.8 g/dL (ref 3.5–4.8)
Alkaline Phosphatase: 66 IU/L (ref 39–117)
BUN/Creatinine Ratio: 26 (ref 11–26)
BUN: 18 mg/dL (ref 8–27)
Bilirubin Total: 0.4 mg/dL (ref 0.0–1.2)
CO2: 23 mmol/L (ref 18–29)
Calcium: 9.1 mg/dL (ref 8.7–10.3)
Chloride: 105 mmol/L (ref 97–108)
Creatinine, Ser: 0.69 mg/dL (ref 0.57–1.00)
GFR calc Af Amer: 99 mL/min/{1.73_m2} (ref >59)
GFR calc non Af Amer: 86 mL/min/{1.73_m2} (ref >59)
Globulin, Total: 2 g/dL (ref 1.5–4.5)
Glucose: 96 mg/dL (ref 65–99)
Potassium: 4.6 mmol/L (ref 3.5–5.2)
Sodium: 143 mmol/L (ref 134–144)
Total Protein: 5.8 g/dL — ABNORMAL LOW (ref 6.0–8.5)

## 2015-04-11 DIAGNOSIS — I5022 Chronic systolic (congestive) heart failure: Secondary | ICD-10-CM | POA: Diagnosis not present

## 2015-04-11 DIAGNOSIS — M1990 Unspecified osteoarthritis, unspecified site: Secondary | ICD-10-CM | POA: Diagnosis not present

## 2015-04-11 DIAGNOSIS — I5030 Unspecified diastolic (congestive) heart failure: Secondary | ICD-10-CM | POA: Diagnosis not present

## 2015-04-11 DIAGNOSIS — D649 Anemia, unspecified: Secondary | ICD-10-CM | POA: Diagnosis not present

## 2015-04-11 DIAGNOSIS — G20 Parkinson's disease: Secondary | ICD-10-CM | POA: Diagnosis not present

## 2015-04-11 DIAGNOSIS — I10 Essential (primary) hypertension: Secondary | ICD-10-CM | POA: Diagnosis not present

## 2015-04-23 ENCOUNTER — Emergency Department (HOSPITAL_COMMUNITY): Payer: Medicare Other

## 2015-04-23 ENCOUNTER — Encounter (HOSPITAL_COMMUNITY): Payer: Self-pay | Admitting: *Deleted

## 2015-04-23 ENCOUNTER — Inpatient Hospital Stay (HOSPITAL_COMMUNITY)
Admission: EM | Admit: 2015-04-23 | Discharge: 2015-04-26 | DRG: 641 | Disposition: A | Discharge status: Home Health | Payer: Medicare Other | Attending: Internal Medicine | Admitting: Internal Medicine

## 2015-04-23 DIAGNOSIS — J449 Chronic obstructive pulmonary disease, unspecified: Secondary | ICD-10-CM | POA: Diagnosis present

## 2015-04-23 DIAGNOSIS — R627 Adult failure to thrive: Principal | ICD-10-CM | POA: Diagnosis present

## 2015-04-23 DIAGNOSIS — I5022 Chronic systolic (congestive) heart failure: Secondary | ICD-10-CM | POA: Diagnosis present

## 2015-04-23 DIAGNOSIS — S3993XA Unspecified injury of pelvis, initial encounter: Secondary | ICD-10-CM | POA: Diagnosis not present

## 2015-04-23 DIAGNOSIS — E860 Dehydration: Secondary | ICD-10-CM

## 2015-04-23 DIAGNOSIS — M1990 Unspecified osteoarthritis, unspecified site: Secondary | ICD-10-CM | POA: Diagnosis present

## 2015-04-23 DIAGNOSIS — R279 Unspecified lack of coordination: Secondary | ICD-10-CM | POA: Diagnosis not present

## 2015-04-23 DIAGNOSIS — G249 Dystonia, unspecified: Secondary | ICD-10-CM

## 2015-04-23 DIAGNOSIS — K5900 Constipation, unspecified: Secondary | ICD-10-CM | POA: Diagnosis present

## 2015-04-23 DIAGNOSIS — Z885 Allergy status to narcotic agent status: Secondary | ICD-10-CM | POA: Diagnosis not present

## 2015-04-23 DIAGNOSIS — H409 Unspecified glaucoma: Secondary | ICD-10-CM | POA: Diagnosis present

## 2015-04-23 DIAGNOSIS — Z79899 Other long term (current) drug therapy: Secondary | ICD-10-CM

## 2015-04-23 DIAGNOSIS — G20 Parkinson's disease: Secondary | ICD-10-CM | POA: Diagnosis present

## 2015-04-23 DIAGNOSIS — Z9181 History of falling: Secondary | ICD-10-CM | POA: Diagnosis not present

## 2015-04-23 DIAGNOSIS — I10 Essential (primary) hypertension: Secondary | ICD-10-CM | POA: Diagnosis not present

## 2015-04-23 DIAGNOSIS — Z880 Allergy status to penicillin: Secondary | ICD-10-CM

## 2015-04-23 DIAGNOSIS — F419 Anxiety disorder, unspecified: Secondary | ICD-10-CM | POA: Diagnosis present

## 2015-04-23 DIAGNOSIS — L03116 Cellulitis of left lower limb: Secondary | ICD-10-CM | POA: Diagnosis not present

## 2015-04-23 DIAGNOSIS — M79606 Pain in leg, unspecified: Secondary | ICD-10-CM

## 2015-04-23 DIAGNOSIS — T1490 Injury, unspecified: Secondary | ICD-10-CM | POA: Diagnosis not present

## 2015-04-23 DIAGNOSIS — J189 Pneumonia, unspecified organism: Secondary | ICD-10-CM | POA: Diagnosis not present

## 2015-04-23 DIAGNOSIS — R5381 Other malaise: Secondary | ICD-10-CM | POA: Diagnosis present

## 2015-04-23 DIAGNOSIS — D649 Anemia, unspecified: Secondary | ICD-10-CM | POA: Diagnosis present

## 2015-04-23 DIAGNOSIS — G20A1 Parkinson's disease without dyskinesia, without mention of fluctuations: Secondary | ICD-10-CM | POA: Diagnosis present

## 2015-04-23 DIAGNOSIS — K5909 Other constipation: Secondary | ICD-10-CM | POA: Diagnosis present

## 2015-04-23 LAB — URINALYSIS, ROUTINE W REFLEX MICROSCOPIC
Bilirubin Urine: NEGATIVE
Glucose, UA: NEGATIVE mg/dL
Hgb urine dipstick: NEGATIVE
Ketones, ur: 15 mg/dL — AB
Nitrite: NEGATIVE
PROTEIN: NEGATIVE mg/dL
Specific Gravity, Urine: 1.025 (ref 1.005–1.030)
Urobilinogen, UA: 1 mg/dL (ref 0.0–1.0)
pH: 6.5 (ref 5.0–8.0)

## 2015-04-23 LAB — CBC WITH DIFFERENTIAL/PLATELET
BASOS PCT: 1 % (ref 0–1)
Basophils Absolute: 0.1 10*3/uL (ref 0.0–0.1)
EOS ABS: 0.1 10*3/uL (ref 0.0–0.7)
Eosinophils Relative: 2 % (ref 0–5)
HCT: 39.3 % (ref 36.0–46.0)
Hemoglobin: 13 g/dL (ref 12.0–15.0)
Lymphocytes Relative: 15 % (ref 12–46)
Lymphs Abs: 1.1 10*3/uL (ref 0.7–4.0)
MCH: 28 pg (ref 26.0–34.0)
MCHC: 33.1 g/dL (ref 30.0–36.0)
MCV: 84.7 fL (ref 78.0–100.0)
Monocytes Absolute: 0.8 10*3/uL (ref 0.1–1.0)
Monocytes Relative: 10 % (ref 3–12)
NEUTROS ABS: 5.6 10*3/uL (ref 1.7–7.7)
NEUTROS PCT: 73 % (ref 43–77)
Platelets: 146 10*3/uL — ABNORMAL LOW (ref 150–400)
RBC: 4.64 MIL/uL (ref 3.87–5.11)
RDW: 16.3 % — ABNORMAL HIGH (ref 11.5–15.5)
WBC: 7.7 10*3/uL (ref 4.0–10.5)

## 2015-04-23 LAB — URINE MICROSCOPIC-ADD ON

## 2015-04-23 LAB — CREATININE, SERUM
Creatinine, Ser: 0.98 mg/dL (ref 0.44–1.00)
GFR calc Af Amer: >60 mL/min (ref >60)
GFR, EST NON AFRICAN AMERICAN: 55 mL/min — AB (ref >60)

## 2015-04-23 LAB — CBC
HCT: 38.2 % (ref 36.0–46.0)
Hemoglobin: 12.5 g/dL (ref 12.0–15.0)
MCH: 27.8 pg (ref 26.0–34.0)
MCHC: 32.7 g/dL (ref 30.0–36.0)
MCV: 85.1 fL (ref 78.0–100.0)
PLATELETS: 128 10*3/uL — AB (ref 150–400)
RBC: 4.49 MIL/uL (ref 3.87–5.11)
RDW: 16.4 % — AB (ref 11.5–15.5)
WBC: 7.7 10*3/uL (ref 4.0–10.5)

## 2015-04-23 LAB — I-STAT CHEM 8, ED
BUN: 18 mg/dL (ref 6–20)
CHLORIDE: 103 mmol/L (ref 101–111)
Calcium, Ion: 1.23 mmol/L (ref 1.13–1.30)
Creatinine, Ser: 0.9 mg/dL (ref 0.44–1.00)
Glucose, Bld: 95 mg/dL (ref 65–99)
HEMATOCRIT: 40 % (ref 36.0–46.0)
Hemoglobin: 13.6 g/dL (ref 12.0–15.0)
Potassium: 4.1 mmol/L (ref 3.5–5.1)
Sodium: 141 mmol/L (ref 135–145)
TCO2: 25 mmol/L (ref 0–100)

## 2015-04-23 LAB — MAGNESIUM: Magnesium: 1.9 mg/dL (ref 1.7–2.4)

## 2015-04-23 LAB — PHOSPHORUS: Phosphorus: 3.7 mg/dL (ref 2.5–4.6)

## 2015-04-23 MED ORDER — GABAPENTIN 100 MG PO CAPS
100.0000 mg | ORAL_CAPSULE | Freq: Three times a day (TID) | ORAL | Status: DC
  Administered 2015-04-23 – 2015-04-26 (×8): 100 mg via ORAL
  Filled 2015-04-23 (×9): qty 1

## 2015-04-23 MED ORDER — ASPIRIN EC 81 MG PO TBEC
81.0000 mg | DELAYED_RELEASE_TABLET | Freq: Every day | ORAL | Status: DC
  Administered 2015-04-23 – 2015-04-26 (×4): 81 mg via ORAL
  Filled 2015-04-23 (×4): qty 1

## 2015-04-23 MED ORDER — ONDANSETRON HCL 4 MG/2ML IJ SOLN: 4.0000 mg | Freq: Four times a day (QID) | INTRAMUSCULAR | Status: DC | PRN

## 2015-04-23 MED ORDER — ONDANSETRON HCL 4 MG PO TABS: 4.0000 mg | ORAL_TABLET | Freq: Four times a day (QID) | ORAL | Status: DC | PRN

## 2015-04-23 MED ORDER — POLYETHYL GLYCOL-PROPYL GLYCOL 0.4-0.3 % OP SOLN: 1.0000 [drp] | Freq: Every day | OPHTHALMIC | Status: DC

## 2015-04-23 MED ORDER — LORAZEPAM 0.5 MG PO TABS: 0.5000 mg | ORAL_TABLET | Freq: Two times a day (BID) | ORAL | Status: DC | PRN

## 2015-04-23 MED ORDER — HYPROMELLOSE (GONIOSCOPIC) 2.5 % OP SOLN
1.0000 [drp] | Freq: Every day | OPHTHALMIC | Status: DC
  Administered 2015-04-24 – 2015-04-26 (×3): 1 [drp] via OPHTHALMIC
  Filled 2015-04-23: qty 15

## 2015-04-23 MED ORDER — CARBIDOPA-LEVODOPA 25-100 MG PO TABS
1.0000 | ORAL_TABLET | Freq: Every day | ORAL | Status: DC
  Administered 2015-04-23 – 2015-04-26 (×17): 1 via ORAL
  Filled 2015-04-23 (×17): qty 1

## 2015-04-23 MED ORDER — MAGNESIUM 200 MG PO TABS: ORAL_TABLET | Freq: Every day | ORAL | Status: DC

## 2015-04-23 MED ORDER — MULTI-VITAMIN/MINERALS PO TABS: 1.0000 | ORAL_TABLET | Freq: Every day | ORAL | Status: DC

## 2015-04-23 MED ORDER — SENNOSIDES-DOCUSATE SODIUM 8.6-50 MG PO TABS
1.0000 | ORAL_TABLET | Freq: Two times a day (BID) | ORAL | Status: DC
  Administered 2015-04-23 – 2015-04-26 (×6): 1 via ORAL
  Filled 2015-04-23 (×5): qty 1

## 2015-04-23 MED ORDER — OXYCODONE HCL 5 MG PO TABS
5.0000 mg | ORAL_TABLET | ORAL | Status: DC | PRN
  Administered 2015-04-23: 5 mg via ORAL
  Filled 2015-04-23: qty 1

## 2015-04-23 MED ORDER — IRON 325 (65 FE) MG PO TABS: 325.0000 mg | ORAL_TABLET | Freq: Every day | ORAL | Status: DC

## 2015-04-23 MED ORDER — SODIUM CHLORIDE 0.9 % IV BOLUS (SEPSIS)
1000.0000 mL | Freq: Once | INTRAVENOUS | Status: AC
  Administered 2015-04-23: 1000 mL via INTRAVENOUS

## 2015-04-23 MED ORDER — FERROUS SULFATE 325 (65 FE) MG PO TABS
325.0000 mg | ORAL_TABLET | Freq: Every day | ORAL | Status: DC
  Administered 2015-04-24 – 2015-04-26 (×3): 325 mg via ORAL
  Filled 2015-04-23 (×3): qty 1

## 2015-04-23 MED ORDER — ENOXAPARIN SODIUM 40 MG/0.4ML ~~LOC~~ SOLN
40.0000 mg | SUBCUTANEOUS | Status: DC
  Administered 2015-04-24 – 2015-04-26 (×3): 40 mg via SUBCUTANEOUS
  Filled 2015-04-23 (×3): qty 0.4

## 2015-04-23 MED ORDER — PRAMIPEXOLE DIHYDROCHLORIDE 1.5 MG PO TABS
1.5000 mg | ORAL_TABLET | Freq: Three times a day (TID) | ORAL | Status: DC
  Administered 2015-04-23 – 2015-04-26 (×9): 1.5 mg via ORAL
  Filled 2015-04-23 (×10): qty 1

## 2015-04-23 MED ORDER — FUROSEMIDE 20 MG PO TABS
20.0000 mg | ORAL_TABLET | Freq: Every day | ORAL | Status: DC
  Administered 2015-04-24 – 2015-04-26 (×3): 20 mg via ORAL
  Filled 2015-04-23 (×3): qty 1

## 2015-04-23 MED ORDER — VITAMIN D 1000 UNITS PO TABS
1000.0000 [IU] | ORAL_TABLET | Freq: Every day | ORAL | Status: DC
  Administered 2015-04-24 – 2015-04-26 (×3): 1000 [IU] via ORAL
  Filled 2015-04-23 (×3): qty 1

## 2015-04-23 MED ORDER — ADULT MULTIVITAMIN W/MINERALS CH
1.0000 | ORAL_TABLET | Freq: Every day | ORAL | Status: DC
  Administered 2015-04-24 – 2015-04-26 (×3): 1 via ORAL
  Filled 2015-04-23 (×3): qty 1

## 2015-04-23 MED ORDER — CLINDAMYCIN PHOSPHATE 900 MG/50ML IV SOLN
900.0000 mg | Freq: Once | INTRAVENOUS | Status: AC
  Administered 2015-04-23: 900 mg via INTRAVENOUS
  Filled 2015-04-23: qty 50

## 2015-04-23 MED ORDER — LATANOPROST 0.005 % OP SOLN
1.0000 [drp] | Freq: Every day | OPHTHALMIC | Status: DC
  Administered 2015-04-24 – 2015-04-26 (×3): 1 [drp] via OPHTHALMIC
  Filled 2015-04-23: qty 2.5

## 2015-04-23 NOTE — ED Provider Notes (Signed)
The patient is a 74 year old female, she has a known history of parkinsonism, she has some difficulty ambulating and uses a "4 wheeler" she is able to transfer back and forth but is having increasing falls. She has fallen twice today, she has to call her family member to come and help her get off the ground. She reports being treated for cellulitis in the past, she does not remember when that was, on exam she does have some erythema of her left lower extremity with warmth, it is circumferential, there is no induration or signs of abscess. She has no tachycardia, soft abdomen with a periumbilical hernia which is easily reducible. She has no abnormal lung sounds, she does have abnormal parkinsonian movements but this appears normal for her.  I saw and evaluated the patient, reviewed the resident's note and I agree with the findings and plan.   EKG Interpretation  Date/Time:  Sunday April 23 2015 16:26:11 EDT Ventricular Rate:  71 PR Interval:  78 QRS Duration: 85 QT Interval:  471 QTC Calculation: 512 R Axis:   37 Text Interpretation:  Sinus rhythm Short PR interval Borderline low voltage, extremity leads Nonspecific T abnormalities, diffuse leads Prolonged QT interval Since last tracing rate slower Confirmed by Laylaa Guevarra  MD, Landon Bassford ([id number]) on 04/23/2015 5:14:23 PM        Final diagnoses:  Cellulitis of left lower extremity  Debility  Mild dehydration      Noemi Chapel, MD 04/24/15 1620

## 2015-04-23 NOTE — H&P (Signed)
[facility] History and Physical  [name]:[id number] DOB: [birth year] DOA: 04/23/2015  Referring physician: Noemi Chapel, M.D. PCP: Hollace Kinnier, DO   Chief Complaint: Weakness and falls.  HPI: [name] is a 74 y.o. female with a past medical history of Parkinson's disease, hypertension, arthritis, anemia, COPD, CHF, glaucoma, cataracts who was brought to the emergency department due to progressive weakness over the past week plus and having 2 falls today. She lives by herself, gets food delivered at home, and relatives go by periodically to check on her, but she states she has been having trouble even walking with her assistive devices at home. Weakness was so severe today that she couldn't hold onto her 4 wheel walker and fell twice. She denies chest pain, dizziness, palpitations, diaphoresis, PND, orthopnea, edema of the lower extremities, nausea or emesis. She denies fever, chills, sore throat, productive cough, diarrhea, dysuria or any other urinary symptoms. She feels that her Parkinson's disease has worsened.  She is currently in no acute distress and denies any other acute complaints.  Review of Systems:  Constitutional:  No weight loss, night sweats, Fevers, chills, fatigue.  HEENT:  No headaches, Difficulty swallowing,Tooth/dental problems,Sore throat,  No sneezing, itching, ear ache, nasal congestion, post nasal drip,  Cardio-vascular:  No chest pain, Orthopnea, PND, swelling in lower extremities, anasarca, dizziness, palpitations  GI:  No heartburn, indigestion, abdominal pain, nausea, vomiting, diarrhea, change in bowel habits, loss of appetite  Resp:  No shortness of breath with exertion or at rest. No excess mucus, no productive cough, No non-productive cough, No coughing up of blood.No change in color of mucus.No wheezing.No chest wall deformity  Skin:  no rash or lesions.  GU:  no dysuria, change in color of urine, no urgency or frequency. No flank  pain.  Musculoskeletal:  Generalized weakness, 2 falls today. No joint pain or swelling. No decreased range of motion. No back pain.  Psych:  No change in mood or affect. No depression or anxiety. No memory loss.   Past Medical History  Diagnosis Date  . Parkinson's disease   . Hypertension   . Arthritis   . Anemia   . Cellulitis of lower leg 12/24/2011  . Acute bronchitis   . Other abnormal blood chemistry   . Chronic systolic heart failure   . Acute on chronic systolic heart failure   . Cellulitis and abscess of leg, except foot   . Hypotension, unspecified   . Helicobacter pylori (H. pylori)   . Acute gastric ulcer with hemorrhage, without mention of obstruction   . CHF (congestive heart failure)   . Anxiety   . Cataract left  . Glaucoma left   Past Surgical History  Procedure Laterality Date  . Hip arthroscopy w/ labral repair    . Thyroidectomy    . Esophagogastroduodenoscopy  09/26/2011    Procedure: ESOPHAGOGASTRODUODENOSCOPY (EGD);  Surgeon: Zenovia Jarred, MD;  Location: [facility] ENDOSCOPY;  Service: Gastroenterology;  Laterality: N/A;  To be done at bedside.  . Esophagogastroduodenoscopy N/A 09/30/2013    Procedure: ESOPHAGOGASTRODUODENOSCOPY (EGD);  Surgeon: [name], MD;  Location: [facility];  Service: Endoscopy;  Laterality: N/A;  . Esophagogastroduodenoscopy N/A 11/25/2013    Procedure: ESOPHAGOGASTRODUODENOSCOPY (EGD);  Surgeon: [name], MD;  Location: [facility];  Service: Endoscopy;  Laterality: N/A;   Social History:  reports that she has never smoked. She has never used smokeless tobacco. She reports that she does not drink alcohol or use illicit drugs.  Allergies  Allergen Reactions  . Codeine Other (See Comments)    Unknown allergic reaction  . Penicillins Other (See Comments)    Pt passed out in doctor's office after penicillin dose    Family History  Problem Relation Age of Onset  . GER disease Mother   . Heart disease Father     Prior to  Admission medications   Medication Sig Start Date End Date Taking? Authorizing Provider  Calcium Carb-Cholecalciferol (CALCIUM + D3 PO) Take 1 tablet by mouth daily.   Yes Historical Provider, MD  carbidopa-levodopa (SINEMET IR) 25-100 MG per tablet Take 1 tablet by mouth 6 (six) times daily. Take at 6a, 9a, 12n, 3p, 6pm, 9pm   Yes Historical Provider, MD  Cholecalciferol (VITAMIN D PO) Take 1 tablet by mouth daily.   Yes Historical Provider, MD  Ferrous Sulfate (IRON) 325 (65 FE) MG TABS Take 1 capsule by mouth daily. Patient taking differently: Take 325 mg by mouth daily.  12/16/14  Yes Daniel J Angiulli, PA-C  latanoprost (XALATAN) 0.005 % ophthalmic solution Place 1 drop into the left eye daily at 6 PM.  02/23/15  Yes Historical Provider, MD  Multiple Vitamins-Minerals (MULTIVITAMIN WITH MINERALS) tablet Take 1 tablet by mouth daily.   Yes Historical Provider, MD  Polyethyl Glycol-Propyl Glycol (SYSTANE OP) Place 1 drop into the right eye daily at 6 PM.   Yes Historical Provider, MD  furosemide (LASIX) 20 MG tablet Take 20 mg by mouth 2 (two) times daily as needed for fluid.     Historical Provider, MD  gabapentin (NEURONTIN) 100 MG capsule Take 1 capsule (100 mg total) by mouth 3 (three) times daily. 01/12/15   Lauree Chandler, NP  LORazepam (ATIVAN) 0.5 MG tablet Take 1 tablet (0.5 mg total) by mouth 2 (two) times daily as needed for anxiety. 12/16/14   Lavon Paganini Angiulli, PA-C  MAGNESIUM PO Take 1 tablet by mouth daily.    Historical Provider, MD  oxyCODONE (OXY IR/ROXICODONE) 5 MG immediate release tablet Take 1 tablet (5 mg total) by mouth every 4 (four) hours as needed for moderate pain. 12/16/14   Lavon Paganini Angiulli, PA-C  pramipexole (MIRAPEX) 1.5 MG tablet Take 1 tablet (1.5 mg total) by mouth 3 (three) times daily. 12/16/14   Lavon Paganini Angiulli, PA-C  senna-docusate (SENOKOT-S) 8.6-50 MG per tablet Take 1 tablet by mouth 2 (two) times daily. 12/16/14   Cathlyn Parsons, PA-C   Physical  Exam: Filed Vitals:   04/23/15 1700 04/23/15 1730 04/23/15 1814 04/23/15 1902  BP: 110/82 110/83 106/45 142/95  Pulse: 69 67 65   Temp:      TempSrc:      Resp: 21     Height:      Weight:      SpO2: 98% 98% 97%     Wt Readings from Last 3 Encounters:  04/23/15 76.204 kg (168 lb)  04/03/15 76.567 kg (168 lb 12.8 oz)  03/14/15 81.647 kg (180 lb)    General:  Appears calm and comfortable Eyes: PERRL, normal lids, irises & conjunctiva ENT: grossly normal hearing, lips & tongue Neck: no LAD, masses or thyromegaly Cardiovascular: RRR, no m/r/g. No LE edema. Telemetry: SR, no arrhythmias  Respiratory: CTA bilaterally, no w/r/r. Normal respiratory effort. Abdomen: soft, ntnd Skin: no rash or induration seen on limited exam Musculoskeletal: grossly normal tone BUE/BLE Psychiatric: grossly normal mood and affect, speech appropriate Neurologic: Basal tremor, global weakness, but grossly non-focal.  Labs on Admission:  Basic Metabolic Panel:  Recent Labs Lab 04/23/15 1814  NA 141  K 4.1  CL 103  GLUCOSE 95  BUN 18  CREATININE 0.90   Liver Function Tests: No results for input(s): AST, ALT, ALKPHOS, BILITOT, PROT, ALBUMIN in the last 168 hours. No results for input(s): LIPASE, AMYLASE in the last 168 hours. No results for input(s): AMMONIA in the last 168 hours. CBC:  Recent Labs Lab 04/23/15 1743 04/23/15 1814  WBC 7.7  --   NEUTROABS 5.6  --   HGB 13.0 13.6  HCT 39.3 40.0  MCV 84.7  --   PLT 146*  --     Radiological Exams on Admission: Dg Pelvis Portable  04/23/2015   CLINICAL DATA:  Fall  EXAM: PORTABLE PELVIS 1-2 VIEWS  COMPARISON:  None.  FINDINGS: Prior right hip replacement. No acute bony abnormality. Specifically, no fracture, subluxation, or dislocation. Soft tissues are intact.  IMPRESSION: No acute bony abnormality.   Electronically Signed   By: Rolm Baptise M.D.   On: 04/23/2015 17:15   Dg Chest Portable 1 View  04/23/2015   CLINICAL DATA:   Evaluate pneumonia.  History of hypertension.  EXAM: PORTABLE CHEST - 1 VIEW  COMPARISON:  07/30/2013  FINDINGS: Low lung volumes. No confluent airspace opacities. Heart is borderline in size, accentuated by the low volumes and portable nature of the study. No effusions. No acute bony abnormality.  IMPRESSION: Low lung volumes.  No acute findings.   Electronically Signed   By: Rolm Baptise M.D.   On: 04/23/2015 17:15    EKG: Independently reviewed. Vent. rate 71 BPM PR interval 78 ms QRS duration 85 ms QT/QTc 471/512 ms P-R-T axes 65 37 -6  Sinus rhythm Short PR interval Borderline low voltage, extremity leads Nonspecific T abnormalities, diffuse leads Prolonged QT interval Since last tracing rate slower  Assessment/Plan Principal Problem:   Failure to thrive in adult Active Problems:   Parkinson's disease   Essential hypertension   Physical deconditioning   Chronic constipation     Admit to MedSurg. Physical therapy evaluation. Social services evaluation. Continue regular home meds. Consider evaluation by neurologist. I don't think that the patient has cellulitis of her leg. It is only mildly erythematosus, it's not tender, she does not have a fever and her white blood cell count is normal.   Code Status: I'll code. DVT Prophylaxis: Lovenox SQ. Family Communication:          Eleonor, [place]) Brother [phone]     [place], [place]  [phone]  [phone]  Disposition Plan: Home with outpatient follow-up.  Time spent: Over 70 minutes.  Reubin Milan [facility] Pager [phone].

## 2015-04-23 NOTE — ED Provider Notes (Signed)
History   Chief Complaint  Patient presents with  . Referral  . Fall    HPI 74 year old female past history as below notable for Parkinson's disease, hypertension, chronic systolic heart failure, who presents to ED after her family member dropped her off to be evaluated for rehabilitation referral. According to the patient, she lives alone and has been falling frequently as she is very weak and fatigued. She also says she has poor appetite and is having difficulty caring for self at home. She says she has not injured anything that she can note from falling but did note having some mild left hip pain while on the toilet yesterday. She denies having any fevers, chills, chest pain, shortness of breath, abdominal pain, nausea vomiting diarrhea, urinary symptoms. Patient says she has had some redness to her lower left shin which has been worsening over the past several days. She says she has history of cellulitis in his extremity several months ago. Patient says this region is slightly tender. Denies having any pain in the back of her calf or posterior leg. Family member is not here for further history. No other complaints at this time.  Onset of symptoms: gradual. Modifying factors none.  Severity: moderate.  Associated symptoms: as above.  Hx of similar symptoms: no.    Past medical/surgical history, social history, medications, allergies and FH have been reviewed with patient and/or in documentation. Furthermore, if pt family or friend(s) present, additional historical information was obtained from them.  Past Medical History  Diagnosis Date  . Parkinson's disease   . Hypertension   . Arthritis   . Anemia   . Cellulitis of lower leg 12/24/2011  . Acute bronchitis   . Other abnormal blood chemistry   . Chronic systolic heart failure   . Acute on chronic systolic heart failure   . Cellulitis and abscess of leg, except foot   . Hypotension, unspecified   . Helicobacter pylori (H. pylori)   .  Acute gastric ulcer with hemorrhage, without mention of obstruction   . CHF (congestive heart failure)   . Anxiety   . Cataract left  . Glaucoma left   Past Surgical History  Procedure Laterality Date  . Hip arthroscopy w/ labral repair    . Thyroidectomy    . Esophagogastroduodenoscopy  09/26/2011    Procedure: ESOPHAGOGASTRODUODENOSCOPY (EGD);  Surgeon: Zenovia Jarred, MD;  Location: [facility] ENDOSCOPY;  Service: Gastroenterology;  Laterality: N/A;  To be done at bedside.  . Esophagogastroduodenoscopy N/A 09/30/2013    Procedure: ESOPHAGOGASTRODUODENOSCOPY (EGD);  Surgeon: [name], MD;  Location: [facility];  Service: Endoscopy;  Laterality: N/A;  . Esophagogastroduodenoscopy N/A 11/25/2013    Procedure: ESOPHAGOGASTRODUODENOSCOPY (EGD);  Surgeon: [name], MD;  Location: [facility];  Service: Endoscopy;  Laterality: N/A;   Family History  Problem Relation Age of Onset  . GER disease Mother   . Heart disease Father    History  Substance Use Topics  . Smoking status: Never Smoker   . Smokeless tobacco: Never Used  . Alcohol Use: No     Review of Systems Constitutional: - F/C, +fatigue.  HENT: - congestion, -rhinorrhea, -sore throat.   Eyes: - eye pain, -visual disturbance.  Respiratory: - cough, -SOB, -hemoptysis.   Cardiovascular: - CP, -palps.  Gastrointestinal: - N/V/D, -abd pain  Genitourinary: - flank pain, -dysuria, -frequency.  Musculoskeletal: - myalgia/arthritis, -joint swelling, -gait abnormality, -back pain, -neck pain/stiffness, -leg pain/swelling.  Skin: + rash/lesion.  Neurological: - focal weakness, -  lightheadedness, -dizziness, -numbness, -HA.  All other systems reviewed and are negative.   Physical Exam  Physical Exam  ED Triage Vitals  Enc Vitals Group     BP 04/23/15 1624 129/52 mmHg     Pulse Rate 04/23/15 1624 72     Resp 04/23/15 1624 23     Temp 04/23/15 1624 98.4 F (36.9 C)     Temp Source 04/23/15 1624 Oral     SpO2 04/23/15 1624 97  %     Weight 04/23/15 1624 168 lb (76.204 kg)     Height 04/23/15 1624 5\' 4"  (1.626 m)     Head Cir --      Peak Flow --      Pain Score --      Pain Loc --      Pain Edu? --      Excl. in [place]? --    Constitutional: Patient is chronically ill appearing and in no acute distress. Mild face and extremity tremors noted.Pt very weak on exam and has significant difficulty standing and transferring self. Head: Normocephalic and atraumatic.  Eyes: Extraocular motion intact, no scleral icterus Mouth: Mucus membranes slightly dry, OP clear Neck: Supple without meningismus, mass, or overt JVD Respiratory: Normal WOB. Mild rhonchi BL CV: RRR, no obvious murmurs.  Pulses +2 and symmetric. Euvolemic Abdomen: Soft, NT, ND, no r/g. No mass.  MSK: Extremities appear atraumatic without deformity, ROM dec in BLEs. Mild TTP to L hip. NVI distally. Skin: erythema to L shin which is TTP. No open wounds.  Neuro: HDS, AAOx4. PERRL, EOMI, TML, face sym. CN 2-12 grossly intact. 4/5 sym, no drift, SILT, too weak for gait assessment.   ED Course  Procedures   Labs Reviewed  URINALYSIS, ROUTINE W REFLEX MICROSCOPIC (NOT AT [facility])  CBC WITH DIFFERENTIAL/PLATELET  I-STAT CHEM 8, ED   I personally reviewed and interpreted all labs.  DG Pelvis Portable  Final Result    DG Chest Portable 1 View  Final Result     I personally viewed above image(s) which were used in my medical decision making. Formal interpretations by Radiology.  MDM: [name] is a 74 y.o. female with H&P as above who p/w CC: fatigue.  On arrival patient is hemodynamic stable in no apparent distress. Records reviewed and patient has Parkinson's with relatively advanced symptoms from that. Patient is living alone and appears very fatigued and has significant difficulty transferring and is unable to stand in room unassisted. Patient is unable to perform ADLs. Exam as above. Plan is to get screening labs, plain films of chest hip to rule  out infectious sources as well as fracture of hip. Additionally, patient is cellulitis of the left lower extremity. IV clindamycin was given. Chest and pelvis x-rays are negative.   -Results: lytes normal. -Re-evaluation: remains HDS.  -Disposition: Patient will need admission to hospital and likely set up for nursing home.  Old records reviewed (if available). Labs and imaging reviewed personally by myself and considered in medical decision making if ordered.  Clinical Impression: 1. Cellulitis of left lower extremity   2. Debility   3. Mild dehydration     Disposition: admit  Condition: stable  I have discussed the results, Dx and Tx plan with the pt(& family if present). He/she/they expressed understanding and agree(s) with the plan.   Pt seen in conjunction with Dr. Noemi Chapel, MD  Kirstie Peri, [facility] Resident - PGY-3    Kirstie Peri, MD  04/24/15 0016  Noemi Chapel, MD 04/24/15 1620

## 2015-04-23 NOTE — ED Notes (Signed)
Report attempted 

## 2015-04-23 NOTE — Progress Notes (Signed)
NURSING PROGRESS NOTE  TIFFANNY LAMARCHE [id number] Admission Data: 04/23/2015 8:09 PM Attending Provider: Reubin Milan, MD HWE:XHBZ, [name], DO Code Status: Full   KEBRINA FRIEND is a 74 y.o. female patient admitted from ED:  -No acute distress noted.  -No complaints of shortness of breath.  -No complaints of chest pain.   Blood pressure 142/95, pulse 65, temperature 98.4 F (36.9 C), temperature source Oral, resp. rate 21, height 5\' 4"  (1.626 m), weight 76.204 kg (168 lb), SpO2 97 %.   IV Fluids:  IV in place, occlusive dsg intact without redness, IV  Allergies:  Codeine and Penicillins  Past Medical History:   has a past medical history of Parkinson's disease; Hypertension; Arthritis; Anemia; Cellulitis of lower leg (12/24/2011); Acute bronchitis; Other abnormal blood chemistry; Chronic systolic heart failure; Acute on chronic systolic heart failure; Cellulitis and abscess of leg, except foot; Hypotension, unspecified; Helicobacter pylori (H. pylori); Acute gastric ulcer with hemorrhage, without mention of obstruction; CHF (congestive heart failure); Anxiety; Cataract (left); and Glaucoma (left).  Past Surgical History:   has past surgical history that includes Hip arthroscopy w/ labral repair; Thyroidectomy; Esophagogastroduodenoscopy (09/26/2011); Esophagogastroduodenoscopy (N/A, 09/30/2013); and Esophagogastroduodenoscopy (N/A, 11/25/2013).  Social History:   reports that she has never smoked. She has never used smokeless tobacco. She reports that she does not drink alcohol or use illicit drugs.  Skin: Intact  Patient/Family oriented to room. Information packet given to patient/family. Admission inpatient armband information verified with patient/family to include name and date of birth and placed on patient arm. Side rails up x 2, fall assessment and education completed with patient/family. Patient/family able to verbalize understanding of risk associated with falls and verbalized  understanding to call for assistance before getting out of bed. Call light within reach. Patient/family able to voice and demonstrate understanding of unit orientation instructions.

## 2015-04-23 NOTE — ED Notes (Signed)
Pt comes to ED via EMS from home where she lives alone. pts brother called EMS stating that pt has been falling for 1 week and needs a referral to rehab. Pt denies pain. No LOC.

## 2015-04-24 DIAGNOSIS — K5900 Constipation, unspecified: Secondary | ICD-10-CM | POA: Diagnosis not present

## 2015-04-24 DIAGNOSIS — G20 Parkinson's disease: Secondary | ICD-10-CM | POA: Diagnosis not present

## 2015-04-24 DIAGNOSIS — I10 Essential (primary) hypertension: Secondary | ICD-10-CM

## 2015-04-24 DIAGNOSIS — G249 Dystonia, unspecified: Secondary | ICD-10-CM | POA: Diagnosis not present

## 2015-04-24 DIAGNOSIS — R279 Unspecified lack of coordination: Secondary | ICD-10-CM

## 2015-04-24 DIAGNOSIS — I5022 Chronic systolic (congestive) heart failure: Secondary | ICD-10-CM | POA: Diagnosis not present

## 2015-04-24 DIAGNOSIS — R627 Adult failure to thrive: Secondary | ICD-10-CM | POA: Diagnosis not present

## 2015-04-24 LAB — CBC
HCT: 38.1 % (ref 36.0–46.0)
Hemoglobin: 12.2 g/dL (ref 12.0–15.0)
MCH: 27.5 pg (ref 26.0–34.0)
MCHC: 32 g/dL (ref 30.0–36.0)
MCV: 85.8 fL (ref 78.0–100.0)
Platelets: 103 10*3/uL — ABNORMAL LOW (ref 150–400)
RBC: 4.44 MIL/uL (ref 3.87–5.11)
RDW: 16.6 % — ABNORMAL HIGH (ref 11.5–15.5)
WBC: 5.7 10*3/uL (ref 4.0–10.5)

## 2015-04-24 LAB — COMPREHENSIVE METABOLIC PANEL
ALBUMIN: 2.9 g/dL — AB (ref 3.5–5.0)
ALT: 6 U/L — ABNORMAL LOW (ref 14–54)
AST: 30 U/L (ref 15–41)
Alkaline Phosphatase: 52 U/L (ref 38–126)
Anion gap: 3 — ABNORMAL LOW (ref 5–15)
BILIRUBIN TOTAL: 1 mg/dL (ref 0.3–1.2)
BUN: 12 mg/dL (ref 6–20)
CALCIUM: 8.3 mg/dL — AB (ref 8.9–10.3)
CO2: 24 mmol/L (ref 22–32)
Chloride: 110 mmol/L (ref 101–111)
Creatinine, Ser: 0.83 mg/dL (ref 0.44–1.00)
GFR calc non Af Amer: >60 mL/min (ref >60)
GLUCOSE: 91 mg/dL (ref 65–99)
POTASSIUM: 4.1 mmol/L (ref 3.5–5.1)
SODIUM: 137 mmol/L (ref 135–145)
Total Protein: 5.3 g/dL — ABNORMAL LOW (ref 6.5–8.1)

## 2015-04-24 MED ORDER — AMANTADINE HCL 100 MG PO CAPS: 100.0000 mg | ORAL_CAPSULE | Freq: Two times a day (BID) | ORAL | Status: DC

## 2015-04-24 MED ORDER — AMANTADINE HCL 100 MG PO CAPS
100.0000 mg | ORAL_CAPSULE | Freq: Every day | ORAL | Status: DC
  Administered 2015-04-24: 100 mg via ORAL
  Filled 2015-04-24 (×2): qty 1

## 2015-04-24 NOTE — Evaluation (Signed)
Physical Therapy Evaluation Patient Details Name: [name] MRN: [id number] DOB: 12/31/1940 Today's Date: 04/24/2015   History of Present Illness  Pt adm with weakness and falls at home. PMH - Parkinson's, HTN  Clinical Impression  Pt admitted with above diagnosis and presents to PT with functional limitations due to deficits listed below (See PT problem list). Pt needs skilled PT to maximize independence and safety to allow discharge to ST-[name] prior to return home.      Follow Up Recommendations [name]    Equipment Recommendations  None recommended by PT    Recommendations for Other Services       Precautions / Restrictions Precautions Precautions: Fall      Mobility  Bed Mobility Overal bed mobility: Needs Assistance Bed Mobility: Supine to Sit     Supine to sit: Min assist     General bed mobility comments: Assist to bring trunk up.  Transfers Overall transfer level: Needs assistance Equipment used: 4-wheeled walker;1 person hand held assist Transfers: Sit to/from [facility] Sit to Stand: Min assist Stand pivot transfers: Min assist       General transfer comment: Assist for balance and support  Ambulation/Gait Ambulation/Gait assistance: Min assist Ambulation Distance (Feet): 100 Feet Assistive device: 4-wheeled walker Gait Pattern/deviations: Step-through pattern;Decreased step length - right;Decreased step length - left;Trunk flexed;Staggering left;Staggering right   Gait velocity interpretation: Below normal speed for age/gender General Gait Details: Assist for balance  Stairs            Wheelchair Mobility    Modified Rankin (Stroke Patients Only)       Balance Overall balance assessment: Needs assistance Sitting-balance support: No upper extremity supported;Feet supported Sitting balance-Leahy Scale: Fair     Standing balance support: Single extremity supported;During functional activity Standing balance-Leahy  Scale: Poor Standing balance comment: Rollator and min A for static standing.                             Pertinent Vitals/Pain Pain Assessment: No/denies pain    Home Living Family/patient expects to be discharged to:: Private residence Living Arrangements: Alone Available Help at Discharge: Family;Available PRN/intermittently Type of Home: House Home Access: Ramped entrance     Home Layout: One level Home Equipment: [place] - 4 wheels;Bedside commode;Tub bench;Wheelchair - Biomedical engineer Comments: pt lives alone, brothers get her groceries for her, she has Meals on Wheels for lunch and one of her brothers brings her supper.    Prior Function Level of Independence: Independent with assistive device(s)         Comments: Uses rollator. Pt with recent falls.     Hand Dominance   Dominant Hand: Right    Extremity/Trunk Assessment   Upper Extremity Assessment: Defer to OT evaluation           Lower Extremity Assessment: Generalized weakness      Cervical / Trunk Assessment: Other exceptions (flexed posture and dyskinesia present)  Communication   Communication: Other (comment) (dysarthric)  Cognition Arousal/Alertness: Awake/alert Behavior During Therapy: WFL for tasks assessed/performed Overall Cognitive Status: Within Functional Limits for tasks assessed                      General Comments      Exercises        Assessment/Plan    PT Assessment Patient needs continued PT services  PT Diagnosis Difficulty walking;Abnormality of gait;Generalized weakness   PT Problem  List Decreased strength;Decreased balance;Decreased mobility;Decreased coordination  PT Treatment Interventions DME instruction;Gait training;Functional mobility training;Therapeutic activities;Therapeutic exercise;Balance training;Neuromuscular re-education;Patient/family education   PT Goals (Current goals can be found in the Care Plan section)  Acute Rehab PT Goals Patient Stated Goal: Get better PT Goal Formulation: With patient Time For Goal Achievement: 05/08/15 Potential to Achieve Goals: Good    Frequency Min 3X/week   Barriers to discharge Decreased caregiver support      Co-evaluation               End of Session Equipment Utilized During Treatment: Gait belt Activity Tolerance: Patient tolerated treatment well Patient left: in chair;with call bell/phone within reach;with chair alarm set Nurse Communication: Mobility status         Time: 4782-9562 PT Time Calculation (min) (ACUTE ONLY): 17 min   Charges:   PT Evaluation $Initial PT Evaluation Tier I: 1 Procedure     PT G Codes:        Theodoro Koval 05/06/15, 11:41 AM  Suanne Marker PT [phone]

## 2015-04-24 NOTE — Progress Notes (Signed)
[facility] PROGRESS NOTE  [name] BCW:[id number] DOB: [birth year] DOA: 04/23/2015 PCP: Hollace Kinnier, DO  Assessment/Plan: #1 failure to thrive/weakness/falls Questionable etiology. Urine cultures pending. Chest x-ray negative for any acute infiltrate. Patient with dyskinesia noted on exam. PT/OT. Will likely need a skilled nursing facility.  #2 Parkinson's disease with dyskinesia Patient has been seen in consultation by neurology who recommended continuing Sinemet. Add amantadine. PT/OT. Feel patient may benefit from deep brain stimulation as outpatient. Neural follow.  #3 HTN  Stable  #4 Chronic systolic CHF Stable. Continue diuretics.  #5 prophylaxis Lovenox for DVT prophylaxis.  Code Status: Full Family Communication: Updated patient. No family at bedside. Disposition Plan: [name]   Consultants:  Neurology: Dr. Janann Colonel 04/24/2015  Procedures:  Chest x-ray 04/23/2015 plain films of the pelvis 04/23/2015    Antibiotics:  None  HPI/Subjective: Patient with no complaints. Patient denies SOB. No CP.  Objective: Filed Vitals:   04/24/15 1400  BP: 111/74  Pulse: 63  Temp: 97.8 F (36.6 C)  Resp: 16    Intake/Output Summary (Last 24 hours) at 04/24/15 1446 Last data filed at 04/24/15 1300  Gross per 24 hour  Intake    600 ml  Output    300 ml  Net    300 ml   Filed Weights   04/23/15 1624  Weight: 76.204 kg (168 lb)    Exam:   General:  Patient with writhling/choreform movements while speaking.Pill rolling tremors.  Cardiovascular: RRR  Respiratory: CTAB  Abdomen: Soft/NT/ND/+BS  Musculoskeletal: No c/c/e  Data Reviewed: Basic Metabolic Panel:  Recent Labs Lab 04/23/15 1814 04/23/15 2025 04/24/15 0609  NA 141  --  137  K 4.1  --  4.1  CL 103  --  110  CO2  --   --  24  GLUCOSE 95  --  91  BUN 18  --  12  CREATININE 0.90 0.98 0.83  CALCIUM  --   --  8.3*  MG  --  1.9  --   PHOS  --  3.7  --    Liver Function  Tests:  Recent Labs Lab 04/24/15 0609  AST 30  ALT 6*  ALKPHOS 52  BILITOT 1.0  PROT 5.3*  ALBUMIN 2.9*   No results for input(s): LIPASE, AMYLASE in the last 168 hours. No results for input(s): AMMONIA in the last 168 hours. CBC:  Recent Labs Lab 04/23/15 1743 04/23/15 1814 04/23/15 2025 04/24/15 0609  WBC 7.7  --  7.7 5.7  NEUTROABS 5.6  --   --   --   HGB 13.0 13.6 12.5 12.2  HCT 39.3 40.0 38.2 38.1  MCV 84.7  --  85.1 85.8  PLT 146*  --  128* 103*   Cardiac Enzymes: No results for input(s): CKTOTAL, CKMB, CKMBINDEX, TROPONINI in the last 168 hours. BNP (last 3 results) No results for input(s): BNP in the last 8760 hours.  ProBNP (last 3 results) No results for input(s): PROBNP in the last 8760 hours.  CBG: No results for input(s): GLUCAP in the last 168 hours.  No results found for this or any previous visit (from the past 240 hour(s)).   Studies: Dg Pelvis Portable  04/23/2015   CLINICAL DATA:  Fall  EXAM: PORTABLE PELVIS 1-2 VIEWS  COMPARISON:  None.  FINDINGS: Prior right hip replacement. No acute bony abnormality. Specifically, no fracture, subluxation, or dislocation. Soft tissues are intact.  IMPRESSION: No acute bony abnormality.   Electronically Signed   By: Lennette Bihari  [place] M.D.   On: 04/23/2015 17:15   Dg Chest Portable 1 View  04/23/2015   CLINICAL DATA:  Evaluate pneumonia.  History of hypertension.  EXAM: PORTABLE CHEST - 1 VIEW  COMPARISON:  07/30/2013  FINDINGS: Low lung volumes. No confluent airspace opacities. Heart is borderline in size, accentuated by the low volumes and portable nature of the study. No effusions. No acute bony abnormality.  IMPRESSION: Low lung volumes.  No acute findings.   Electronically Signed   By: Rolm Baptise M.D.   On: 04/23/2015 17:15    Scheduled Meds: . aspirin EC  81 mg Oral Daily  . carbidopa-levodopa  1 tablet Oral 6 X Daily  . cholecalciferol  1,000 Units Oral Daily  . enoxaparin (LOVENOX) injection  40 mg  Subcutaneous Q24H  . ferrous sulfate  325 mg Oral Q breakfast  . furosemide  20 mg Oral Daily  . gabapentin  100 mg Oral TID  . hydroxypropyl methylcellulose / hypromellose  1 drop Right Eye q1800  . latanoprost  1 drop Left Eye q1800  . multivitamin with minerals  1 tablet Oral Daily  . pramipexole  1.5 mg Oral TID  . senna-docusate  1 tablet Oral BID   Continuous Infusions:   Principal Problem:   Failure to thrive in adult Active Problems:   Dyskinesia   Parkinson's disease   Essential hypertension   Physical deconditioning   Chronic constipation    Time spent: [place] MD [facility] Pager [phone]. If 7PM-7AM, please contact night-coverage at [email or web address], password [facility] 04/24/2015, 2:46 PM  LOS: 1 day

## 2015-04-24 NOTE — Evaluation (Signed)
Occupational Therapy Evaluation Patient Details Name: [name] MRN: [id number] DOB: [birth year] Today's Date: 04/24/2015    History of Present Illness Pt adm with weakness and falls at home. PMH - Parkinson's, HTN   Clinical Impression   This 74 yo female admitted with above presents to acute OT with decreased balance, decreased mobility, tremors/athetoid like movements at rest--that somewhat calm down with functional tasks all affecting her ability to care for herself at home alone. She will benefit from acute OT with follow up at [name] to return home alone.    Follow Up Recommendations  [name]    Equipment Recommendations  None recommended by OT       Precautions / Restrictions Precautions Precautions: Fall      Mobility Bed Mobility Overal bed mobility: Needs Assistance Bed Mobility: Sit to Supine     Sit to supine: Min assist (to get straight in the bed)    Transfers Overall transfer level: Needs assistance Equipment used: 1 person hand held assist Transfers: Sit to/from [facility] Sit to Stand: Min assist Stand pivot transfers: Min assist       General transfer comment: Assist for balance and support    Balance Overall balance assessment: Needs assistance Sitting-balance support: Feet supported;No upper extremity supported Sitting balance-Leahy Scale: Fair (due to tremors)     Standing balance support: Single extremity supported;During functional activity Standing balance-Leahy Scale: Poor Standing balance comment: Rollator and min A for static standing.                            ADL Overall ADL's : Needs assistance/impaired Eating/Feeding: Independent;Sitting   Grooming: Set up;Supervision/safety;Sitting   Upper Body Bathing: Set up;Supervision/ safety;Sitting   Lower Body Bathing: Minimal assistance;Sit to/from stand   Upper Body Dressing : Supervision/safety;Set up;Sitting   Lower Body Dressing: Minimal  assistance;Sit to/from stand   Toilet Transfer: Minimal assistance;Stand-pivot;BSC   Toileting- Clothing Manipulation and Hygiene: Minimal assistance;Sit to/from stand         General ADL Comments: all activities take increased time due to tremors     Vision Additional Comments: no change from baseline          Pertinent Vitals/Pain Pain Assessment: No/denies pain (does say at times her tremors/athetoid like movements do cause pain)     Hand Dominance Right   Extremity/Trunk Assessment Upper Extremity Assessment Upper Extremity Assessment: Overall WFL for tasks assessed (does have tremors/athetoid like movements--however are less intense with functional acitivies (ie: doffing/donning socks, transfering to/from toliet and toileting hygiene/clothing))     Communication Communication Communication:  (dysarthric due to parkinsons)   Cognition Arousal/Alertness: Awake/alert Behavior During Therapy: WFL for tasks assessed/performed Overall Cognitive Status: Within Functional Limits for tasks assessed                                Home Living Family/patient expects to be discharged to:: Private residence Living Arrangements: Alone Available Help at Discharge: Family;Available PRN/intermittently Type of Home: House Home Access: Ramped entrance     Home Layout: One level     Bathroom Shower/Tub: Tub/shower unit;Curtain Shower/tub characteristics: Architectural technologist: Standard (elevated toilet seat) Bathroom Accessibility: Yes How Accessible: Accessible via walker Home Equipment: Plum - 4 wheels;Bedside commode;Tub bench;Wheelchair - Media planner Comments: pt lives alone, brothers get her groceries for her, she has Meals on Wheels for lunch and one of  her brothers brings her supper.      Prior Functioning/Environment Level of Independence: Independent with assistive device(s)        Comments: Uses rollator. Pt with recent  falls.    OT Diagnosis: Generalized weakness   OT Problem List: Decreased strength;Impaired balance (sitting and/or standing);Obesity   OT Treatment/Interventions: Self-care/ADL training;Patient/family education;Balance training;DME and/or AE instruction;Therapeutic activities    OT Goals(Current goals can be found in the care plan section) Acute Rehab OT Goals Patient Stated Goal: rather go home, but open to rehab if it will help me OT Goal Formulation: With patient Time For Goal Achievement: 05/01/15 Potential to Achieve Goals: Good  OT Frequency: Min 2X/week   Barriers to D/C: Decreased caregiver support             End of Session Nurse Communication:  (Pt had urinated in "hat" from urine sample)  Activity Tolerance: Patient tolerated treatment well Patient left: in bed;with call bell/phone within reach;with bed alarm set   Time: 1339-1401 OT Time Calculation (min): 22 min Charges:  OT General Charges $OT Visit: 1 Procedure OT Evaluation $Initial OT Evaluation Tier I: 1 Procedure  Almon Register [phone] 04/24/2015, 2:29 PM

## 2015-04-24 NOTE — Consult Note (Signed)
NEURO HOSPITALIST CONSULT NOTE   Referring physician: Grandville Silos   Reason for Consult: Parkinson's disease and movements.   HPI:                                                                                                                                          [name] is an 74 y.o. female with known PD who takes Sinemet 6 times a day.  She was admitted to hospital for weakness and falls. Patient states she usually does not fall and uses a walker at home. Concern was that her tremor had worsened and neurology was asked for evaluation.   Past Medical History  Diagnosis Date  . Parkinson's disease   . Hypertension   . Arthritis   . Anemia   . Cellulitis of lower leg 12/24/2011  . Acute bronchitis   . Other abnormal blood chemistry   . Chronic systolic heart failure   . Acute on chronic systolic heart failure   . Cellulitis and abscess of leg, except foot   . Hypotension, unspecified   . Helicobacter pylori (H. pylori)   . Acute gastric ulcer with hemorrhage, without mention of obstruction   . CHF (congestive heart failure)   . Anxiety   . Cataract left  . Glaucoma left    Past Surgical History  Procedure Laterality Date  . Hip arthroscopy w/ labral repair    . Thyroidectomy    . Esophagogastroduodenoscopy  09/26/2011    Procedure: ESOPHAGOGASTRODUODENOSCOPY (EGD);  Surgeon: Zenovia Jarred, MD;  Location: [facility] ENDOSCOPY;  Service: Gastroenterology;  Laterality: N/A;  To be done at bedside.  . Esophagogastroduodenoscopy N/A 09/30/2013    Procedure: ESOPHAGOGASTRODUODENOSCOPY (EGD);  Surgeon: [name], MD;  Location: [facility];  Service: Endoscopy;  Laterality: N/A;  . Esophagogastroduodenoscopy N/A 11/25/2013    Procedure: ESOPHAGOGASTRODUODENOSCOPY (EGD);  Surgeon: [name], MD;  Location: [facility] ENDOSCOPY;  Service: Endoscopy;  Laterality: N/A;    Family History  Problem Relation Age of Onset  . GER disease Mother   . Heart disease Father       Social History:  reports that she has never smoked. She has never used smokeless tobacco. She reports that she does not drink alcohol or use illicit drugs.  Allergies  Allergen Reactions  . Codeine Other (See Comments)    Unknown allergic reaction  . Penicillins Other (See Comments)    Pt passed out in doctor's office after penicillin dose    MEDICATIONS:  Scheduled: . aspirin EC  81 mg Oral Daily  . carbidopa-levodopa  1 tablet Oral 6 X Daily  . cholecalciferol  1,000 Units Oral Daily  . enoxaparin (LOVENOX) injection  40 mg Subcutaneous Q24H  . ferrous sulfate  325 mg Oral Q breakfast  . furosemide  20 mg Oral Daily  . gabapentin  100 mg Oral TID  . hydroxypropyl methylcellulose / hypromellose  1 drop Right Eye q1800  . latanoprost  1 drop Left Eye q1800  . multivitamin with minerals  1 tablet Oral Daily  . pramipexole  1.5 mg Oral TID  . senna-docusate  1 tablet Oral BID     ROS:                                                                                                                                       History obtained from the patient  General ROS: negative for - chills, fatigue, fever, night sweats, weight gain or weight loss Psychological ROS: negative for - behavioral disorder, hallucinations, memory difficulties, mood swings or suicidal ideation Ophthalmic ROS: negative for - blurry vision, double vision, eye pain or loss of vision ENT ROS: negative for - epistaxis, nasal discharge, oral lesions, sore throat, tinnitus or vertigo Allergy and Immunology ROS: negative for - hives or itchy/watery eyes Hematological and Lymphatic ROS: negative for - bleeding problems, bruising or swollen lymph nodes Endocrine ROS: negative for - galactorrhea, hair pattern changes, polydipsia/polyuria or temperature intolerance Respiratory ROS: negative for -  cough, hemoptysis, shortness of breath or wheezing Cardiovascular ROS: negative for - chest pain, dyspnea on exertion, edema or irregular heartbeat Gastrointestinal ROS: negative for - abdominal pain, diarrhea, hematemesis, nausea/vomiting or stool incontinence Genito-Urinary ROS: negative for - dysuria, hematuria, incontinence or urinary frequency/urgency Musculoskeletal ROS: negative for - joint swelling or muscular weakness Neurological ROS: as noted in HPI Dermatological ROS: negative for rash and skin lesion changes   Blood pressure 111/74, pulse 63, temperature 97.8 F (36.6 C), temperature source Oral, resp. rate 16, height 5\' 4"  (1.626 m), weight 76.204 kg (168 lb), SpO2 96 %.   Neurologic Examination:                                                                                                      HEENT-  Normocephalic, no lesions, without obvious abnormality.  Normal external eye and conjunctiva.  Normal TM's bilaterally.  Normal auditory canals and external ears. Normal external nose, mucus membranes and septum.  Normal pharynx.  Cardiovascular- S1, S2 normal, pulses palpable throughout   Lungs- chest clear, no wheezing, rales, normal symmetric air entry Abdomen- soft, non-tender; bowel sounds normal; no masses,  no organomegaly Extremities- no edema Lymph-no adenopathy palpable Musculoskeletal-no joint tenderness, deformity or swelling Skin-warm and dry, no hyperpigmentation, vitiligo, or suspicious lesions  Neurological Examination Mental Status: Alert, oriented, thought content appropriate.  Speech dysarthric (baseline) without evidence of aphasia.  Able to follow 3 step commands without difficulty. Cranial Nerves: II: Discs flat bilaterally; Visual fields grossly normal, pupils equal, round, reactive to light and accommodation III,IV, VI: ptosis not present, extra-ocular motions intact bilaterally V,VII: smile symmetric, facial light touch sensation normal  bilaterally VIII: hearing normal bilaterally IX,X: uvula rises symmetrically XI: bilateral shoulder shrug XII: midline tongue extension Motor: Moving all extremities antigravity. At rest is showing writhing movements in UE and pill rolling tremor of both hands.  She has normal tone of the UE and mild increased tone of bilateral LE.  Sensory: Pinprick and light touch intact throughout, bilaterally Deep Tendon Reflexes: 2+ and symmetric throughout UE and 1+ in KJ no AJ Plantars: Mute bilaterally Cerebellar: normal finger-to-nose, Gait: deferred due to safety.       Lab Results: Basic Metabolic Panel:  Recent Labs Lab 04/23/15 1814 04/23/15 2025 04/24/15 0609  NA 141  --  137  K 4.1  --  4.1  CL 103  --  110  CO2  --   --  24  GLUCOSE 95  --  91  BUN 18  --  12  CREATININE 0.90 0.98 0.83  CALCIUM  --   --  8.3*  MG  --  1.9  --   PHOS  --  3.7  --     Liver Function Tests:  Recent Labs Lab 04/24/15 0609  AST 30  ALT 6*  ALKPHOS 52  BILITOT 1.0  PROT 5.3*  ALBUMIN 2.9*   No results for input(s): LIPASE, AMYLASE in the last 168 hours. No results for input(s): AMMONIA in the last 168 hours.  CBC:  Recent Labs Lab 04/23/15 1743 04/23/15 1814 04/23/15 2025 04/24/15 0609  WBC 7.7  --  7.7 5.7  NEUTROABS 5.6  --   --   --   HGB 13.0 13.6 12.5 12.2  HCT 39.3 40.0 38.2 38.1  MCV 84.7  --  85.1 85.8  PLT 146*  --  128* 103*    Cardiac Enzymes: No results for input(s): CKTOTAL, CKMB, CKMBINDEX, TROPONINI in the last 168 hours.  Lipid Panel: No results for input(s): CHOL, TRIG, HDL, CHOLHDL, VLDL, LDLCALC in the last 168 hours.  CBG: No results for input(s): GLUCAP in the last 168 hours.  Microbiology: Results for orders placed or performed during the hospital encounter of 12/07/14  Culture, blood (routine x 2)     Status: None   Collection Time: 12/07/14  3:55 AM  Result Value Ref Range Status   Specimen Description BLOOD RIGHT ARM  Final    Special Requests BOTTLES DRAWN AEROBIC AND ANAEROBIC 10CC EACH  Final   Culture   Final    NO GROWTH 5 DAYS Note: Culture results may be compromised due to an excessive volume of blood received in culture bottles. Performed at [facility]    Report Status 12/13/2014 FINAL  Final  Culture, blood (routine x 2)     Status: None   Collection Time: 12/07/14  4:00 AM  Result Value Ref Range Status   Specimen Description BLOOD RIGHT HAND  Final   Special Requests BOTTLES DRAWN AEROBIC ONLY 10CC  Final   Culture   Final    NO GROWTH 5 DAYS Note: Culture results may be compromised due to an excessive volume of blood received in culture bottles. Performed at [facility]    Report Status 12/13/2014 FINAL  Final    Coagulation Studies: No results for input(s): LABPROT, INR in the last 72 hours.  Imaging: Dg Pelvis Portable  04/23/2015   CLINICAL DATA:  Fall  EXAM: PORTABLE PELVIS 1-2 VIEWS  COMPARISON:  None.  FINDINGS: Prior right hip replacement. No acute bony abnormality. Specifically, no fracture, subluxation, or dislocation. Soft tissues are intact.  IMPRESSION: No acute bony abnormality.   Electronically Signed   By: Rolm Baptise M.D.   On: 04/23/2015 17:15   Dg Chest Portable 1 View  04/23/2015   CLINICAL DATA:  Evaluate pneumonia.  History of hypertension.  EXAM: PORTABLE CHEST - 1 VIEW  COMPARISON:  07/30/2013  FINDINGS: Low lung volumes. No confluent airspace opacities. Heart is borderline in size, accentuated by the low volumes and portable nature of the study. No effusions. No acute bony abnormality.  IMPRESSION: Low lung volumes.  No acute findings.   Electronically Signed   By: Rolm Baptise M.D.   On: 04/23/2015 17:15       Assessment and plan per attending neurologist  Etta Quill PA-C [facility] [phone]  04/24/2015, 3:03 PM   Assessment/Plan:  74 YO female with known PD on Sinemet 6 times daily.  Hospitalized due to increased weakness  and falls. Current work shows no infection Urine culture pending. Exam notable for mild rest tremor and notable generalized dyskinesias. Per patient description the appear to be peak dose in nature.   Recommend:  -continue Sinemet at current dose and schedule -start amantadine 100mg  daily, can titrate up to 100mg  BID if tolerating -rehab evaluation -may benefit from Deep Brain Stimulation, will need more formal evaluation including cognitive evaluation upon discharge   Jim Like, DO [facility] [phone]  If 7pm- 7am, please page neurology on call as listed in [place].

## 2015-04-24 NOTE — Care Management Note (Signed)
Case Management Note  Patient Details  Name: YARROW LINHART MRN: [id number] Date of Birth: 1940/10/11  Subjective/Objective:                 Patient lives at home alone, family checks on her frequently. She receives meals on wheels. Has a walker at home. Has received HH from [facility] in the past and would prefer them if needed at discharge.    Action/Plan:  Will continue to follow. Anticipate HH with [facility] at discharge.  Expected Discharge Date:                  Expected Discharge Plan:  [place]  In-House Referral:     Discharge planning Services  CM Consult  Post Acute Care Choice:    Choice offered to:  Patient  DME Arranged:    DME Agency:     HH Arranged:    [place] Agency:  [facility]  Status of Service:  In process, will continue to follow  Medicare Important Message Given:    Date Medicare IM Given:    Medicare IM give by:    Date Additional Medicare IM Given:    Additional Medicare Important Message give by:     If discussed at [place] of Stay Meetings, dates discussed:    Additional Comments:  Carles Collet, RN 04/24/2015, 11:10 AM

## 2015-04-25 DIAGNOSIS — I5022 Chronic systolic (congestive) heart failure: Secondary | ICD-10-CM | POA: Diagnosis not present

## 2015-04-25 DIAGNOSIS — I10 Essential (primary) hypertension: Secondary | ICD-10-CM | POA: Diagnosis not present

## 2015-04-25 DIAGNOSIS — G249 Dystonia, unspecified: Secondary | ICD-10-CM | POA: Diagnosis not present

## 2015-04-25 DIAGNOSIS — G20 Parkinson's disease: Secondary | ICD-10-CM

## 2015-04-25 DIAGNOSIS — R279 Unspecified lack of coordination: Secondary | ICD-10-CM | POA: Diagnosis not present

## 2015-04-25 DIAGNOSIS — R5381 Other malaise: Secondary | ICD-10-CM

## 2015-04-25 DIAGNOSIS — K5900 Constipation, unspecified: Secondary | ICD-10-CM | POA: Diagnosis not present

## 2015-04-25 DIAGNOSIS — R627 Adult failure to thrive: Secondary | ICD-10-CM | POA: Diagnosis not present

## 2015-04-25 LAB — URINE CULTURE

## 2015-04-25 MED ORDER — AMANTADINE HCL 100 MG PO CAPS
100.0000 mg | ORAL_CAPSULE | Freq: Every day | ORAL | Status: DC
  Administered 2015-04-25 – 2015-04-26 (×2): 100 mg via ORAL
  Filled 2015-04-25 (×2): qty 1

## 2015-04-25 MED ORDER — AMANTADINE HCL 100 MG PO CAPS
100.0000 mg | ORAL_CAPSULE | Freq: Two times a day (BID) | ORAL | Status: DC
  Filled 2015-04-25 (×2): qty 1

## 2015-04-25 NOTE — Progress Notes (Signed)
Utilization review completed. Jeffery Bachmeier, RN, BSN. 

## 2015-04-25 NOTE — Progress Notes (Signed)
Subjective: No overnight events. Tolerating addition of amantadine well.   Objective: Current vital signs: BP 137/57 mmHg  Pulse 56  Temp(Src) 98.1 F (36.7 C) (Oral)  Resp 18  Ht 5\' 4"  (1.626 m)  Wt 76.204 kg (168 lb)  BMI 28.82 kg/m2  SpO2 96% Vital signs in last 24 hours: Temp:  [97.7 F (36.5 C)-98.1 F (36.7 C)] 98.1 F (36.7 C) (08/02 0523) Pulse Rate:  [56-67] 56 (08/02 0523) Resp:  [16-18] 18 (08/02 0523) BP: (111-143)/(57-74) 137/57 mmHg (08/02 0523) SpO2:  [96 %] 96 % (08/02 0523)  Intake/Output from previous day: 08/01 0701 - 08/02 0700 In: 960 [P.O.:960] Out: 300 [Urine:300] Intake/Output this shift: Total I/O In: 240 [P.O.:240] Out: -  Nutritional status: Diet Heart Room service appropriate?: Yes; Fluid consistency:: Thin  Neurologic Exam: Mental Status: Alert, oriented, thought content appropriate. Speech dysarthric (baseline) without evidence of aphasia. Cranial Nerves: II: pupils equal, round, reactive to light  III,IV, VI: ptosis not present, extra-ocular motions intact bilaterally V,VII: smile symmetric, facial light touch sensation normal bilaterally Motor: Moving all extremities antigravity. At rest is showing writhing movements in UE and pill rolling tremor of both hands. She has normal tone of the UE and mild increased tone of bilateral LE.  Sensory:  light touch intact throughout, bilaterally  Lab Results: Basic Metabolic Panel:  Recent Labs Lab 04/23/15 1814 04/23/15 2025 04/24/15 0609  NA 141  --  137  K 4.1  --  4.1  CL 103  --  110  CO2  --   --  24  GLUCOSE 95  --  91  BUN 18  --  12  CREATININE 0.90 0.98 0.83  CALCIUM  --   --  8.3*  MG  --  1.9  --   PHOS  --  3.7  --     Liver Function Tests:  Recent Labs Lab 04/24/15 0609  AST 30  ALT 6*  ALKPHOS 52  BILITOT 1.0  PROT 5.3*  ALBUMIN 2.9*   No results for input(s): LIPASE, AMYLASE in the last 168 hours. No results for input(s): AMMONIA in the last 168  hours.  CBC:  Recent Labs Lab 04/23/15 1743 04/23/15 1814 04/23/15 2025 04/24/15 0609  WBC 7.7  --  7.7 5.7  NEUTROABS 5.6  --   --   --   HGB 13.0 13.6 12.5 12.2  HCT 39.3 40.0 38.2 38.1  MCV 84.7  --  85.1 85.8  PLT 146*  --  128* 103*    Cardiac Enzymes: No results for input(s): CKTOTAL, CKMB, CKMBINDEX, TROPONINI in the last 168 hours.  Lipid Panel: No results for input(s): CHOL, TRIG, HDL, CHOLHDL, VLDL, LDLCALC in the last 168 hours.  CBG: No results for input(s): GLUCAP in the last 168 hours.  Microbiology: Results for orders placed or performed during the hospital encounter of 12/07/14  Culture, blood (routine x 2)     Status: None   Collection Time: 12/07/14  3:55 AM  Result Value Ref Range Status   Specimen Description BLOOD RIGHT ARM  Final   Special Requests BOTTLES DRAWN AEROBIC AND ANAEROBIC 10CC EACH  Final   Culture   Final    NO GROWTH 5 DAYS Note: Culture results may be compromised due to an excessive volume of blood received in culture bottles. Performed at [facility]    Report Status 12/13/2014 FINAL  Final  Culture, blood (routine x 2)     Status: None   Collection  Time: 12/07/14  4:00 AM  Result Value Ref Range Status   Specimen Description BLOOD RIGHT HAND  Final   Special Requests BOTTLES DRAWN AEROBIC ONLY 10CC  Final   Culture   Final    NO GROWTH 5 DAYS Note: Culture results may be compromised due to an excessive volume of blood received in culture bottles. Performed at [facility]    Report Status 12/13/2014 FINAL  Final    Coagulation Studies: No results for input(s): LABPROT, INR in the last 72 hours.  Imaging: Dg Pelvis Portable  04/23/2015   CLINICAL DATA:  Fall  EXAM: PORTABLE PELVIS 1-2 VIEWS  COMPARISON:  None.  FINDINGS: Prior right hip replacement. No acute bony abnormality. Specifically, no fracture, subluxation, or dislocation. Soft tissues are intact.  IMPRESSION: No acute bony abnormality.    Electronically Signed   By: Rolm Baptise M.D.   On: 04/23/2015 17:15   Dg Chest Portable 1 View  04/23/2015   CLINICAL DATA:  Evaluate pneumonia.  History of hypertension.  EXAM: PORTABLE CHEST - 1 VIEW  COMPARISON:  07/30/2013  FINDINGS: Low lung volumes. No confluent airspace opacities. Heart is borderline in size, accentuated by the low volumes and portable nature of the study. No effusions. No acute bony abnormality.  IMPRESSION: Low lung volumes.  No acute findings.   Electronically Signed   By: Rolm Baptise M.D.   On: 04/23/2015 17:15    Medications:  Scheduled: . amantadine  100 mg Oral Daily  . aspirin EC  81 mg Oral Daily  . carbidopa-levodopa  1 tablet Oral 6 X Daily  . cholecalciferol  1,000 Units Oral Daily  . enoxaparin (LOVENOX) injection  40 mg Subcutaneous Q24H  . ferrous sulfate  325 mg Oral Q breakfast  . furosemide  20 mg Oral Daily  . gabapentin  100 mg Oral TID  . hydroxypropyl methylcellulose / hypromellose  1 drop Right Eye q1800  . latanoprost  1 drop Left Eye q1800  . multivitamin with minerals  1 tablet Oral Daily  . pramipexole  1.5 mg Oral TID  . senna-docusate  1 tablet Oral BID    Assessment/Plan:  74 YO female with known PD on Sinemet 6 times daily and pramipexole TID. Hospitalized due to increased weakness and falls. Current work shows no infection Urine culture pending. Exam notable for mild rest tremor and notable generalized dyskinesias. Per patient description the appear to be peak dose in nature.   Recommend:  -continue Sinemet and pramipexole at current dose and schedule -continue amantadine once daily dosing for now. Can consider increasing to BID dosing upon outpatient follow up -rehab evaluation -may benefit from Deep Brain Stimulation, will need more formal evaluation including cognitive evaluation upon discharge -no further neurological workup indicated at this time. Can follow up with outpatient neurologist upon discharge    LOS: 2 days    Jim Like, DO [facility] [phone]  If 7pm- 7am, please page neurology on call as listed in [place]. 04/25/2015  8:12 AM

## 2015-04-25 NOTE — Progress Notes (Signed)
Physical Therapy Treatment Patient Details Name: [name] MRN: [id number] DOB: 1941-07-26 Today's Date: 2015/05/12    History of Present Illness Pt adm with weakness and falls at home. PMH - Parkinson's, HTN    PT Comments    Pt continues to require assist for all mobility and is at high risk to fall. Feel she continues to need [name] for rehab.  Follow Up Recommendations  [name]     Equipment Recommendations  None recommended by PT    Recommendations for Other Services       Precautions / Restrictions Precautions Precautions: Fall    Mobility  Bed Mobility Overal bed mobility: Needs Assistance Bed Mobility: Supine to Sit     Supine to sit: Min assist     General bed mobility comments: Assist to bring hips to EOB.  Transfers Overall transfer level: Needs assistance Equipment used: 4-wheeled walker;1 person hand held assist   Sit to Stand: Min assist Stand pivot transfers: Min assist       General transfer comment: Assist for balance and support  Ambulation/Gait Ambulation/Gait assistance: Min assist Ambulation Distance (Feet): 150 Feet Assistive device: 4-wheeled walker Gait Pattern/deviations: Step-through pattern;Decreased step length - right;Decreased step length - left;Trunk flexed;Drifts right/left   Gait velocity interpretation: Below normal speed for age/gender General Gait Details: Assist for balance   Stairs            Wheelchair Mobility    Modified Rankin (Stroke Patients Only)       Balance Overall balance assessment: Needs assistance Sitting-balance support: No upper extremity supported;Feet supported Sitting balance-Leahy Scale: Good     Standing balance support: Single extremity supported;During functional activity Standing balance-Leahy Scale: Poor Standing balance comment: Rollator and min A for static standing                    Cognition Arousal/Alertness: Awake/alert Behavior During Therapy: WFL for tasks  assessed/performed Overall Cognitive Status: Within Functional Limits for tasks assessed                      Exercises      General Comments        Pertinent Vitals/Pain Pain Assessment: No/denies pain    Home Living                      Prior Function            PT Goals (current goals can now be found in the care plan section) Acute Rehab PT Goals Patient Stated Goal: Get better PT Goal Formulation: With patient Time For Goal Achievement: 05/08/15 Potential to Achieve Goals: Good Progress towards PT goals: Progressing toward goals    Frequency  Min 3X/week    PT Plan Current plan remains appropriate    Co-evaluation             End of Session Equipment Utilized During Treatment: Gait belt Activity Tolerance: Patient tolerated treatment well Patient left: in chair;with call bell/phone within reach;with chair alarm set     Time: 1213-1228 PT Time Calculation (min) (ACUTE ONLY): 15 min  Charges:  $Gait Training: 8-22 mins                    G Codes:      Moss Berry 2015-05-12, 1:26 PM  [employer] PT [phone]

## 2015-04-25 NOTE — Progress Notes (Signed)
[facility] PROGRESS NOTE  [name] EGB:[id number] DOB: 05-15-1941 DOA: 04/23/2015 PCP: Hollace Kinnier, DO  Assessment/Plan: #1 failure to thrive/weakness/falls Questionable etiology. Patient states falls and weakness have been recent. Urinalysis which was done was positive for leukocytes and nitrite negative with 11-20 WBCs. Urine cultures with multiple species present. Chest x-ray negative for any acute infiltrate. Patient with dyskinesia noted on exam. PT/OT. Patient has been started on amantadine in addition to his Sinemet for Parkinson's disease. Patient until recently was able to manage her ADLs and ambulate. Patient with recent significant weakness requiring 2 person assist and likely needs a skilled nursing facility prior to being discharged home safely. PT/OT.  #2 Parkinson's disease with dyskinesia Patient has been seen in consultation by neurology who recommended continuing Sinemet. Neurology also added amantadine. PT/OT. Feel patient may benefit from deep brain stimulation as outpatient. Patient will likely need outpatient neurology follow-up.   #3 HTN  Stable  #4 Chronic systolic CHF Stable. Continue diuretics.  #5 prophylaxis Lovenox for DVT prophylaxis.  Code Status: Full Family Communication: Updated patient. No family at bedside. Disposition Plan: [name]. Patient is significantly debilitated and is a 2 person assist.   Consultants:  Neurology: Dr. Janann Colonel 04/24/2015  Procedures:  Chest x-ray 04/23/2015 plain films of the pelvis 04/23/2015    Antibiotics:  None  HPI/Subjective: Patient complaining of weakness in her legs. Patient complaining of stiffness in her legs.  Objective: Filed Vitals:   04/25/15 1342  BP: 116/38  Pulse: 68  Temp: 98.6 F (37 C)  Resp: 20    Intake/Output Summary (Last 24 hours) at 04/25/15 1630 Last data filed at 04/25/15 1300  Gross per 24 hour  Intake    840 ml  Output    650 ml  Net    190 ml   Filed Weights    04/23/15 1624  Weight: 76.204 kg (168 lb)    Exam:   General:  Patient with writhling/choreform movements while speaking.Pill rolling tremors.  Cardiovascular: RRR  Respiratory: CTAB  Abdomen: Soft/NT/ND/+BS  Musculoskeletal: No c/c/e  Data Reviewed: Basic Metabolic Panel:  Recent Labs Lab 04/23/15 1814 04/23/15 2025 04/24/15 0609  NA 141  --  137  K 4.1  --  4.1  CL 103  --  110  CO2  --   --  24  GLUCOSE 95  --  91  BUN 18  --  12  CREATININE 0.90 0.98 0.83  CALCIUM  --   --  8.3*  MG  --  1.9  --   PHOS  --  3.7  --    Liver Function Tests:  Recent Labs Lab 04/24/15 0609  AST 30  ALT 6*  ALKPHOS 52  BILITOT 1.0  PROT 5.3*  ALBUMIN 2.9*   No results for input(s): LIPASE, AMYLASE in the last 168 hours. No results for input(s): AMMONIA in the last 168 hours. CBC:  Recent Labs Lab 04/23/15 1743 04/23/15 1814 04/23/15 2025 04/24/15 0609  WBC 7.7  --  7.7 5.7  NEUTROABS 5.6  --   --   --   HGB 13.0 13.6 12.5 12.2  HCT 39.3 40.0 38.2 38.1  MCV 84.7  --  85.1 85.8  PLT 146*  --  128* 103*   Cardiac Enzymes: No results for input(s): CKTOTAL, CKMB, CKMBINDEX, TROPONINI in the last 168 hours. BNP (last 3 results) No results for input(s): BNP in the last 8760 hours.  ProBNP (last 3 results) No results for input(s): PROBNP in  the last 8760 hours.  CBG: No results for input(s): GLUCAP in the last 168 hours.  Recent Results (from the past 240 hour(s))  Culture, Urine     Status: None   Collection Time: 04/23/15  5:37 PM  Result Value Ref Range Status   Specimen Description URINE, RANDOM  Final   Special Requests ADDED [id number] [phone]  Final   Culture MULTIPLE SPECIES PRESENT, SUGGEST RECOLLECTION  Final   Report Status 04/25/2015 FINAL  Final  Urine culture     Status: None   Collection Time: 04/24/15  2:43 PM  Result Value Ref Range Status   Specimen Description URINE, CLEAN CATCH  Final   Special Requests NONE  Final   Culture MULTIPLE  SPECIES PRESENT, SUGGEST RECOLLECTION  Final   Report Status 04/25/2015 FINAL  Final     Studies: Dg Pelvis Portable  04/23/2015   CLINICAL DATA:  Fall  EXAM: PORTABLE PELVIS 1-2 VIEWS  COMPARISON:  None.  FINDINGS: Prior right hip replacement. No acute bony abnormality. Specifically, no fracture, subluxation, or dislocation. Soft tissues are intact.  IMPRESSION: No acute bony abnormality.   Electronically Signed   By: Rolm Baptise M.D.   On: 04/23/2015 17:15   Dg Chest Portable 1 View  04/23/2015   CLINICAL DATA:  Evaluate pneumonia.  History of hypertension.  EXAM: PORTABLE CHEST - 1 VIEW  COMPARISON:  07/30/2013  FINDINGS: Low lung volumes. No confluent airspace opacities. Heart is borderline in size, accentuated by the low volumes and portable nature of the study. No effusions. No acute bony abnormality.  IMPRESSION: Low lung volumes.  No acute findings.   Electronically Signed   By: Rolm Baptise M.D.   On: 04/23/2015 17:15    Scheduled Meds: . amantadine  100 mg Oral Daily  . aspirin EC  81 mg Oral Daily  . carbidopa-levodopa  1 tablet Oral 6 X Daily  . cholecalciferol  1,000 Units Oral Daily  . enoxaparin (LOVENOX) injection  40 mg Subcutaneous Q24H  . ferrous sulfate  325 mg Oral Q breakfast  . furosemide  20 mg Oral Daily  . gabapentin  100 mg Oral TID  . hydroxypropyl methylcellulose / hypromellose  1 drop Right Eye q1800  . latanoprost  1 drop Left Eye q1800  . multivitamin with minerals  1 tablet Oral Daily  . pramipexole  1.5 mg Oral TID  . senna-docusate  1 tablet Oral BID   Continuous Infusions:   Principal Problem:   Failure to thrive in adult Active Problems:   Dyskinesia   Parkinson's disease   Essential hypertension   Physical deconditioning   Chronic constipation    Time spent: Moore MD [facility] Pager [phone]. If 7PM-7AM, please contact night-coverage at [email or web address], password [facility] 04/25/2015, 4:30 PM  LOS: 2 days

## 2015-04-26 ENCOUNTER — Telehealth: Payer: Self-pay | Admitting: Neurology

## 2015-04-26 DIAGNOSIS — K5900 Constipation, unspecified: Secondary | ICD-10-CM | POA: Diagnosis not present

## 2015-04-26 DIAGNOSIS — G20 Parkinson's disease: Secondary | ICD-10-CM | POA: Diagnosis not present

## 2015-04-26 DIAGNOSIS — R627 Adult failure to thrive: Secondary | ICD-10-CM | POA: Diagnosis not present

## 2015-04-26 DIAGNOSIS — I5022 Chronic systolic (congestive) heart failure: Secondary | ICD-10-CM | POA: Diagnosis not present

## 2015-04-26 DIAGNOSIS — I10 Essential (primary) hypertension: Secondary | ICD-10-CM | POA: Diagnosis not present

## 2015-04-26 DIAGNOSIS — G249 Dystonia, unspecified: Secondary | ICD-10-CM | POA: Diagnosis not present

## 2015-04-26 LAB — CBC
HCT: 41.9 % (ref 36.0–46.0)
HEMOGLOBIN: 13.9 g/dL (ref 12.0–15.0)
MCH: 28.1 pg (ref 26.0–34.0)
MCHC: 33.2 g/dL (ref 30.0–36.0)
MCV: 84.6 fL (ref 78.0–100.0)
Platelets: 127 10*3/uL — ABNORMAL LOW (ref 150–400)
RBC: 4.95 MIL/uL (ref 3.87–5.11)
RDW: 16.1 % — AB (ref 11.5–15.5)
WBC: 7.5 10*3/uL (ref 4.0–10.5)

## 2015-04-26 LAB — BASIC METABOLIC PANEL
ANION GAP: 9 (ref 5–15)
BUN: 10 mg/dL (ref 6–20)
CALCIUM: 8.5 mg/dL — AB (ref 8.9–10.3)
CHLORIDE: 106 mmol/L (ref 101–111)
CO2: 22 mmol/L (ref 22–32)
Creatinine, Ser: 0.85 mg/dL (ref 0.44–1.00)
GFR calc Af Amer: >60 mL/min (ref >60)
Glucose, Bld: 118 mg/dL — ABNORMAL HIGH (ref 65–99)
POTASSIUM: 4.1 mmol/L (ref 3.5–5.1)
SODIUM: 137 mmol/L (ref 135–145)

## 2015-04-26 MED ORDER — AMANTADINE HCL 100 MG PO CAPS: 100.0000 mg | ORAL_CAPSULE | Freq: Every day | ORAL | Status: DC

## 2015-04-26 NOTE — Progress Notes (Signed)
Spoke with patient at the bedside. Explained to patient that at this time her hospital stay does meet inpatient criteria (case referred for secondary review to medical advisor Dr Reynaldo Minium). PT recommended [name] placement after discharge. Patient made aware that if she goes to a [name] after discharge, there is a likelihood that she will incur charges from the [name], and may be required to pay out of pocket. She stated that she would not be able to pay for that. CM spoke to patient about [facility] choices. Patient stated that she uses [facility] for [facility] needs and would prefer them after discharge if released with [facility].

## 2015-04-26 NOTE — Progress Notes (Signed)
Physical Therapy Treatment Patient Details Name: [name] MRN: [id number] DOB: 02-Dec-1940 Today's Date: 04/26/2015    History of Present Illness Pt adm with weakness and falls at home. PMH - Parkinson's, HTN    PT Comments    Pt with decline in mobility today and was confused. Pt also very anxious about mobilizing. Was unable to amb at all with pt today and had to use Stedy to pivot to chair due to pt unable to take pivotal steps and said her legs were "freezing" like they do sometimes with her Parkinson's. Pt continues to need assist with all aspects of mobility.  Follow Up Recommendations  [name]     Equipment Recommendations  None recommended by PT    Recommendations for Other Services       Precautions / Restrictions Precautions Precautions: Fall Restrictions Weight Bearing Restrictions: No    Mobility  Bed Mobility Overal bed mobility: Needs Assistance Bed Mobility: Supine to Sit     Supine to sit: Max assist Sit to supine: Min assist   General bed mobility comments: Assist to bring trunk up into sitting.  Transfers Overall transfer level: Needs assistance Equipment used: 4-wheeled walker;Ambulation equipment used Charlaine Dalton) Transfers: Sit to/from [facility] Sit to Stand: Mod assist Stand pivot transfers: Mod assist (using Stedy)       General transfer comment: Assist to bring up hips. Pt with posterior lean and unable to take any pivotal steps for transfer. Got Stedy and transferred pt to chair using this.  Ambulation/Gait             General Gait Details: Pt unable to take any steps   Stairs            Wheelchair Mobility    Modified Rankin (Stroke Patients Only)       Balance Overall balance assessment: Needs assistance Sitting-balance support: Single extremity supported;Feet supported Sitting balance-Leahy Scale: Poor Sitting balance - Comments: Pt with rt lean on EOB. Required min A to correct and unable to be left  sitting on EOB.   Standing balance support: Bilateral upper extremity supported Standing balance-Leahy Scale: Poor Standing balance comment: Heavy posterior lean with rollator                    Cognition Arousal/Alertness: Awake/alert Behavior During Therapy: Anxious Overall Cognitive Status: Impaired/Different from baseline Area of Impairment: Orientation Orientation Level: Disoriented to;Place   Memory: Decreased short-term memory         General Comments: Pt stated, "they say I am at the hospital," when asked where she was. Pt very anxious about falling and about not having her clothes on to walk. Reminded pt we had walked in hall in hospital gown the past two days but pt didn't seem to remember this.    Exercises      General Comments        Pertinent Vitals/Pain Pain Assessment: No/denies pain Pain Score:  (2-3) Pain Location: back Pain Intervention(s): Monitored during session;Repositioned    Home Living                      Prior Function            PT Goals (current goals can now be found in the care plan section) Acute Rehab PT Goals Patient Stated Goal: Get better PT Goal Formulation: With patient Time For Goal Achievement: 05/08/15 Potential to Achieve Goals: Good Progress towards PT goals: Not progressing toward goals -  comment    Frequency  Min 3X/week    PT Plan Current plan remains appropriate    Co-evaluation             End of Session Equipment Utilized During Treatment: Gait belt Activity Tolerance: Patient limited by fatigue Patient left: in chair;with call bell/phone within reach;with chair alarm set     Time: 1001-1033 PT Time Calculation (min) (ACUTE ONLY): 32 min  Charges:  $Therapeutic Activity: 23-37 mins                    G Codes:      Phylicia Mcgaugh 2015/05/19, 12:57 PM  [facility] PT [phone]

## 2015-04-26 NOTE — Telephone Encounter (Signed)
Called again, no answer, left message to make appt.

## 2015-04-26 NOTE — Plan of Care (Signed)
Problem: Phase I Progression Outcomes Goal: Initial discharge plan identified Outcome: Completed/Met Date Met:  04/26/15 PT/OT recommends [name]

## 2015-04-26 NOTE — Discharge Summary (Signed)
[facility]  Physician Discharge Summary   Patient ID: [name] MRN: [id number] DOB/AGE: 1941-05-25 74 y.o.  Admit date: 04/23/2015 Discharge date: 04/26/2015  PCP: REED, [name], DO  DISCHARGE DIAGNOSES:  Principal Problem:   Failure to thrive in adult Active Problems:   Parkinson's disease   Essential hypertension   Physical deconditioning   Chronic constipation   Dyskinesia   Debility   RECOMMENDATIONS FOR OUTPATIENT FOLLOW UP: 1. Home health to be arranged. Patient has no means of paying for a skilled nursing facility. 2. Close follow-up with neurologist   DISCHARGE CONDITION: fair  Diet recommendation: As before  Filed Weights   04/23/15 1624  Weight: 76.204 kg (168 lb)    INITIAL HISTORY: 74 year old Caucasian female presented with weakness, falls. Thought to have worsening of her Parkinson's disease. Seen by neurology. Her medications were adjusted.  Consultations:  Neurology   HOSPITAL COURSE:   #1 failure to thrive/weakness/falls Questionable etiology. Patient states falls and weakness have been recent. Urinalysis which was done was positive for leukocytes and nitrite negative with 11-20 WBCs. Urine cultures with multiple species present. He denies any symptoms suggestive of UTI. Chest x-ray negative for any acute infiltrate. Patient with dyskinesia noted on exam. Could be worsening of her Parkinson's. Patient was seen by neurology. Patient has been started on amantadine in addition to Sinemet for Parkinson's disease. Patient until recently was able to manage her ADLs and ambulate. Patient was seen by PT and OT. [name] was recommended. Patient unable to pay for skilled nursing facility. Her hospitalization does not meet inpatient criteria per medical advisor. Patient would like home health. This will be ordered.  #2 Parkinson's disease with dyskinesia Patient has been seen in consultation by neurology who recommended continuing Sinemet. Neurology  also added amantadine. Feel patient may benefit from deep brain stimulation as outpatient. Patient will likely need outpatient neurology follow-up.   #3 HTN  Stable  #4 Chronic systolic CHF Stable. Continue diuretics.  Physical therapist raised concerns about worsening confusion this morning. I examined the patient after the physical therapist evaluated the patient. Patient was alert and oriented 3. She tells me that when she wakes up in the morning it takes her a few minutes to get oriented. She was also heard talking to her brother over the phone and was making complete sense.   She does not have any facial asymmetry. Tongue is midline. Motor strength is equal bilateral upper and lower extremities. She does have tremors due to her Parkinson's. No pronator drift. Patient feels like she is at baseline.  It will be ideal to get her into a skilled nursing facility. However, she does not have the resources for the same. We will maximize her home health. At this time she is stable for discharge from the hospital. he states that her brother usually checks on the patient every day.   PERTINENT LABS:  The results of significant diagnostics from this hospitalization (including imaging, microbiology, ancillary and laboratory) are listed below for reference.    Microbiology: Recent Results (from the past 240 hour(s))  Culture, Urine     Status: None   Collection Time: 04/23/15  5:37 PM  Result Value Ref Range Status   Specimen Description URINE, RANDOM  Final   Special Requests ADDED [id number] [phone]  Final   Culture MULTIPLE SPECIES PRESENT, SUGGEST RECOLLECTION  Final   Report Status 04/25/2015 FINAL  Final  Urine culture     Status: None   Collection Time: 04/24/15  2:43  PM  Result Value Ref Range Status   Specimen Description URINE, CLEAN CATCH  Final   Special Requests NONE  Final   Culture MULTIPLE SPECIES PRESENT, SUGGEST RECOLLECTION  Final   Report Status 04/25/2015 FINAL  Final      Labs: Basic Metabolic Panel:  Recent Labs Lab 04/23/15 1814 04/23/15 2025 04/24/15 0609 04/26/15 0545  NA 141  --  137 137  K 4.1  --  4.1 4.1  CL 103  --  110 106  CO2  --   --  24 22  GLUCOSE 95  --  91 118*  BUN 18  --  12 10  CREATININE 0.90 0.98 0.83 0.85  CALCIUM  --   --  8.3* 8.5*  MG  --  1.9  --   --   PHOS  --  3.7  --   --    Liver Function Tests:  Recent Labs Lab 04/24/15 0609  AST 30  ALT 6*  ALKPHOS 52  BILITOT 1.0  PROT 5.3*  ALBUMIN 2.9*   CBC:  Recent Labs Lab 04/23/15 1743 04/23/15 1814 04/23/15 2025 04/24/15 0609 04/26/15 0545  WBC 7.7  --  7.7 5.7 7.5  NEUTROABS 5.6  --   --   --   --   HGB 13.0 13.6 12.5 12.2 13.9  HCT 39.3 40.0 38.2 38.1 41.9  MCV 84.7  --  85.1 85.8 84.6  PLT 146*  --  128* 103* 127*    IMAGING STUDIES Dg Pelvis Portable  04/23/2015   CLINICAL DATA:  Fall  EXAM: PORTABLE PELVIS 1-2 VIEWS  COMPARISON:  None.  FINDINGS: Prior right hip replacement. No acute bony abnormality. Specifically, no fracture, subluxation, or dislocation. Soft tissues are intact.  IMPRESSION: No acute bony abnormality.   Electronically Signed   By: Rolm Baptise M.D.   On: 04/23/2015 17:15   Dg Chest Portable 1 View  04/23/2015   CLINICAL DATA:  Evaluate pneumonia.  History of hypertension.  EXAM: PORTABLE CHEST - 1 VIEW  COMPARISON:  07/30/2013  FINDINGS: Low lung volumes. No confluent airspace opacities. Heart is borderline in size, accentuated by the low volumes and portable nature of the study. No effusions. No acute bony abnormality.  IMPRESSION: Low lung volumes.  No acute findings.   Electronically Signed   By: Rolm Baptise M.D.   On: 04/23/2015 17:15    DISCHARGE EXAMINATION: Filed Vitals:   04/25/15 1342 04/25/15 2049 04/26/15 0533 04/26/15 1407  BP: 116/38 115/60 164/84 116/58  Pulse: 68 79 84 70  Temp: 98.6 F (37 C) 98.6 F (37 C) 98.5 F (36.9 C) 97.6 F (36.4 C)  TempSrc: Oral Oral Oral Oral  Resp: 20 18 18 20   Height:       Weight:      SpO2: 98% 97% 95% 97%   General appearance: alert, cooperative, appears stated age and no distress Resp: clear to auscultation bilaterally Cardio: regular rate and rhythm, S1, S2 normal, no murmur, click, rub or gallop GI: soft, non-tender; bowel sounds normal; no masses,  no organomegaly Extremities: extremities normal, atraumatic, no cyanosis or edema  DISPOSITION: home with home health  Discharge Instructions    Call MD for:  difficulty breathing, headache or visual disturbances    Complete by:  As directed      Call MD for:  extreme fatigue    Complete by:  As directed      Call MD for:  persistant dizziness or light-headedness  Complete by:  As directed      Call MD for:  persistant nausea and vomiting    Complete by:  As directed      Call MD for:  severe uncontrolled pain    Complete by:  As directed      Call MD for:  temperature >100.4    Complete by:  As directed      Discharge instructions    Complete by:  As directed   Please follow up with your neurologist for further management of your parkinson's.  You were cared for by a hospitalist during your hospital stay. If you have any questions about your discharge medications or the care you received while you were in the hospital after you are discharged, you can call the unit and asked to speak with the hospitalist on call if the hospitalist that took care of you is not available. Once you are discharged, your primary care physician will handle any further medical issues. Please note that NO REFILLS for any discharge medications will be authorized once you are discharged, as it is imperative that you return to your primary care physician (or establish a relationship with a primary care physician if you do not have one) for your aftercare needs so that they can reassess your need for medications and monitor your lab values. If you do not have a primary care physician, you can call [phone] for a physician  referral.     Increase activity slowly    Complete by:  As directed            ALLERGIES:  Allergies  Allergen Reactions  . Codeine Other (See Comments)    Unknown allergic reaction  . Penicillins Other (See Comments)    Pt passed out in doctor's office after penicillin dose     Current Discharge Medication List    START taking these medications   Details  amantadine (SYMMETREL) 100 MG capsule Take 1 capsule (100 mg total) by mouth [name]. Qty: 30 capsule, Refills: 1      CONTINUE these medications which have NOT CHANGED   Details  carbidopa-levodopa (SINEMET IR) 25-100 MG per tablet Take 1 tablet by mouth 6 (six) times [name]. Take at 6a, 9a, 12n, 3p, 6pm, 9pm    docusate sodium (COLACE) 100 MG capsule Take 100 mg by mouth [name].     Ferrous Sulfate (IRON) 325 (65 FE) MG TABS Take 1 capsule by mouth [name]. Qty: 30 each, Refills: 6   Associated Diagnoses: Iron deficiency anemia    furosemide (LASIX) 40 MG tablet Take 40 mg by mouth [name].    latanoprost (XALATAN) 0.005 % ophthalmic solution Place 1 drop into the left eye [name] at 6 PM.     LORazepam (ATIVAN) 0.5 MG tablet Take 1 tablet (0.5 mg total) by mouth 2 (two) times [name] as needed for anxiety. Qty: 60 tablet, Refills: 0    Multiple Vitamins-Minerals (MULTIVITAMIN WITH MINERALS) tablet Take 1 tablet by mouth [name].    oxyCODONE (OXY IR/ROXICODONE) 5 MG immediate release tablet Take 1 tablet (5 mg total) by mouth every 4 (four) hours as needed for moderate pain. Qty: 60 tablet, Refills: 0    Polyethyl Glycol-Propyl Glycol (SYSTANE OP) Place 1 drop into the right eye [name] at 6 PM.    pramipexole (MIRAPEX) 1.5 MG tablet Take 1 tablet (1.5 mg total) by mouth 3 (three) times [name]. Qty: 90 tablet, Refills: 5   Associated Diagnoses: Parkinson's disease    gabapentin (NEURONTIN)  100 MG capsule Take 1 capsule (100 mg total) by mouth 3 (three) times [name]. Qty: 90 capsule, Refills: 3   Associated Diagnoses: Pain  of lower extremity, unspecified laterality    senna-docusate (SENOKOT-S) 8.6-50 MG per tablet Take 1 tablet by mouth 2 (two) times [name].      STOP taking these medications     Cholecalciferol (VITAMIN D PO)        Follow-up Information    Follow up with REED, [name], DO. Schedule an appointment as soon as possible for a visit in 1 week.   Specialty:  Geriatric Medicine   Why:  post hospitalization follow up/ Appointment with Dr. Mariea Clonts is on 05/08/15 at 10:30am   Contact information:   [place] [place] [address] [phone]       Follow up with Star Age, MD. Schedule an appointment as soon as possible for a visit on 05/17/2015.   Specialties:  Neurology, Radiology   Why:  post hospitalization follow up/ Office will call patient at home with appopintment date   Contact information:   [address] [place] [address] [phone]       Follow up with [facility].   Why:  RN PT OT SLP [facility]HA. will call to set up first visit in the next 24-48 hours.   Contact information:   [address] [place] [address] [phone]       TOTAL DISCHARGE TIME: 8 mins  [facility] Pager [phone]  04/26/2015, 3:02 PM

## 2015-04-26 NOTE — Telephone Encounter (Signed)
Deneise Lever with [facility] (phone number is [phone]) called to schedule appt for patient who is leaving hospital today. She has been in hospital since 04/24/15 with dx of failure to thrive. Dr Rexene Alberts does not have a 30 min time until 05/26/15 and hospital instructions were she needed to be seen in 3 weeks. Please call patient at [phone] with work in time.

## 2015-04-26 NOTE — Discharge Instructions (Signed)
Parkinson Disease Parkinson disease is a disorder of the central nervous system, which includes the brain and spinal cord. A person with this disease slowly loses the ability to completely control body movements. Within the brain, there is a group of nerve cells (basal ganglia) that help control movement. The basal ganglia are damaged and do not work properly in a person with Parkinson disease. In addition, the basal ganglia produce and use a brain chemical called dopamine. The dopamine chemical sends messages to other parts of the body to control and coordinate body movements. Dopamine levels are low in a person with Parkinson disease. If the dopamine levels are low, then the body does not receive the correct messages it needs to move normally.  CAUSES  The exact reason why the basal ganglia get damaged is not known. Some medical researchers have thought that infection, genes, environment, and certain medicines may contribute to the cause.  SYMPTOMS   An early symptom of Parkinson disease is often an uncontrolled shaking (tremor) of the hands. The tremor will often disappear when the affected hand is consciously used.  As the disease progresses, walking, talking, getting out of a chair, and new movements become more difficult.  Muscles get stiff and movements become slower.  Balance and coordination become harder.  Depression, trouble swallowing, urinary problems, constipation, and sleep problems can occur.  Later in the disease, memory and thought processes may deteriorate. DIAGNOSIS  There are no specific tests to diagnose Parkinson disease. You may be referred to a neurologist for evaluation. Your caregiver will ask about your medical history, symptoms, and perform a physical exam. Blood tests and imaging tests of your brain may be performed to rule out other diseases. The imaging tests may include an MRI or a CT scan. TREATMENT  The goal of treatment is to relieve symptoms. Medicines may be  prescribed once the symptoms become troublesome. Medicine will not stop the progression of the disease, but medicine can make movement and balance better and help control tremors. Speech and occupational therapy may also be prescribed. Sometimes, surgical treatment of the brain can be done in young people. HOME CARE INSTRUCTIONS  Get regular exercise and rest periods during the day to help prevent exhaustion and depression.  If getting dressed becomes difficult, replace buttons and zippers with Velcro and elastic on your clothing.  Take all medicine as directed by your caregiver.  Install grab bars or railings in your home to prevent falls.  Go to speech or occupational therapy as directed.  Keep all follow-up visits as directed by your caregiver. SEEK MEDICAL CARE IF:  Your symptoms are not controlled with your medicine.  You fall.  You have trouble swallowing or choke on your food. MAKE SURE YOU:  Understand these instructions.  Will watch your condition.  Will get help right away if you are not doing well or get worse. Document Released: 09/06/2000 Document Revised: 01/04/2013 Document Reviewed: 10/09/2011 ExitCare Patient Information 2015 [employer]. This information is not intended to replace advice given to you by your health care provider. Make sure you discuss any questions you have with your health care provider.  

## 2015-04-26 NOTE — Telephone Encounter (Signed)
Left message to call for appt

## 2015-04-26 NOTE — Care Management Important Message (Signed)
Important Message  Patient Details  Name: MAKALEIGH REINARD MRN: [id number] Date of Birth: [birth year]   Medicare Important Message Given:  Yes-second notification given    Pricilla Handler 04/26/2015, 10:49 AM

## 2015-04-26 NOTE — Progress Notes (Signed)
Patient discharged via wheelchair to home with brother

## 2015-04-26 NOTE — Care Management Note (Signed)
Case Management Note  Patient Details  Name: [name] MRN: [id number] Date of Birth: [birth year]  Subjective/Objective:                 Patient lives at home alone, family checks on her frequently. She receives meals on wheels. Has a walker at home. Has received HH from [facility] in the past and would prefer them if needed at discharge.    Action/Plan:  Miranda with [facility] notified of [place] RN PT OT [facility]HA SLP and discharge today.  Expected Discharge Date:                  Expected Discharge Plan:  [place]  In-House Referral:     Discharge planning Services  CM Consult  Post Acute Care Choice:    Choice offered to:  Patient  DME Arranged:    DME Agency:     HH Arranged:  RN, PT, OT, Nurse's Aide, Speech Therapy HH Agency:  [facility]  Status of Service:  Completed, signed off  Medicare Important Message Given:  Yes-second notification given Date Medicare IM Given:    Medicare IM give by:    Date Additional Medicare IM Given:    Additional Medicare Important Message give by:     If discussed at [place] of Stay Meetings, dates discussed:    Additional Comments:  Carles Collet, RN 04/26/2015, 11:27 AM

## 2015-04-26 NOTE — Progress Notes (Signed)
[name] to be D/C'd Home per MD order.  Discussed with the patient and all questions fully answered.  VSS.  An After Visit Summary was printed and given to the patient. Patient received prescription.  D/c education completed with patient/family including follow up instructions, medication list, d/c activities limitations if indicated, with other d/c instructions as indicated by MD - patient able to verbalize understanding, all questions fully answered.   Patient instructed to return to ED, call 911, or call MD for any changes in condition.    L'ESPERANCE, Baer Hinton C 04/26/2015 6:05 PM

## 2015-04-26 NOTE — Progress Notes (Signed)
Occupational Therapy Treatment Patient Details Name: [name] MRN: [id number] DOB: 1941/04/26 Today's Date: 04/26/2015    History of present illness Pt admitted with weakness and falls at home. PMH - Parkinson's, HTN   OT comments  Recommending [name] for rehab and do NOT feel pt is safe to d/c home.   Follow Up Recommendations  [name]    Equipment Recommendations  None recommended by OT    Recommendations for Other Services      Precautions / Restrictions Precautions Precautions: Fall Restrictions Weight Bearing Restrictions: No       Mobility Bed Mobility               General bed mobility comments: not assessed  Transfers Overall transfer level: Needs assistance   Transfers: Sit to/from Stand           General transfer comment: attempted to stand 1x with OT and unable to stand with 1 person assist. Pt did not want to attempt to stand again.    Balance    Balance not formally assessed.                               ADL Overall ADL's : Needs assistance/impaired     Grooming: Oral care;Wash/dry face;Applying deodorant;Brushing hair;Sitting;Minimal assistance (OT held up right arm for her to put deodorant under it)   Upper Body Bathing: Minimal assitance;Sitting (did not wash full UB)       Upper Body Dressing : Maximal assistance;Sitting (bra/gown)         Toilet Transfer Details (indicate cue type and reason): attempted to stand from chair, but unable to stand with one OT.           General ADL Comments: Pt performed ADLs sitting in chair and was not agreeable to walk with therapist.      Vision                     Perception     Praxis      Cognition  Awake/Alert Behavior During Therapy: [facility] for tasks assessed/performed Overall Cognitive Status:  (seemed to have slow processing)-unsure of baseline                       Extremity/Trunk Assessment               Exercises     Shoulder  Instructions       General Comments      Pertinent Vitals/ Pain       Pain Assessment: 0-10 Pain Score:  (2-3) Pain Location: back Pain Intervention(s): Monitored during session;Repositioned  Home Living                                          Prior Functioning/Environment              Frequency Min 2X/week     Progress Toward Goals  OT Goals(current goals can now be found in the care plan section)  Progress towards OT goals: Not progressing toward goals - comment  Acute Rehab OT Goals Patient Stated Goal: not stated OT Goal Formulation: With patient Time For Goal Achievement: 05/01/15 Potential to Achieve Goals: Good ADL Goals Pt Will Perform Grooming: with set-up;with supervision;sitting;standing (2 tasks at sink) Pt Will Perform Upper Body Bathing:  with set-up;with supervision;standing;sitting Pt Will Perform Lower Body Bathing: with set-up;with supervision;sit to/from stand Pt Will Perform Upper Body Dressing: with set-up;with supervision;sitting;standing Pt Will Perform Lower Body Dressing: with set-up;with supervision;sit to/from stand Pt Will Transfer to Toilet: with supervision;ambulating;bedside commode (over toilet) Pt Will Perform Toileting - Clothing Manipulation and hygiene: with supervision;sit to/from stand Additional ADL Goal #1: Pt will be able to get in and OOB for BADLs at S level  Plan Discharge plan remains appropriate    Co-evaluation                 End of Session Equipment Utilized During Treatment: Gait belt;Rolling walker   Activity Tolerance Other (comment) (pt not wanting to walk with OT or try to stand again)   Patient Left with call bell/phone within reach;in chair;with chair alarm set   Nurse Communication          Time: [phone] OT Time Calculation (min): 22 min  Charges: OT General Charges $OT Visit: 1 Procedure OT Treatments $Self Care/Home Management : 8-22 mins  Benito Mccreedy  OTR/L [phone] 04/26/2015, 12:38 PM

## 2015-04-27 ENCOUNTER — Emergency Department (HOSPITAL_COMMUNITY): Payer: Medicare Other

## 2015-04-27 ENCOUNTER — Encounter (HOSPITAL_COMMUNITY): Payer: Self-pay | Admitting: *Deleted

## 2015-04-27 ENCOUNTER — Inpatient Hospital Stay (HOSPITAL_COMMUNITY)
Admission: EM | Admit: 2015-04-27 | Discharge: 2015-05-01 | DRG: 056 | Disposition: A | Discharge status: Skilled Nursing Facility | Payer: Medicare Other | Attending: Internal Medicine | Admitting: Internal Medicine

## 2015-04-27 DIAGNOSIS — I6789 Other cerebrovascular disease: Secondary | ICD-10-CM | POA: Diagnosis not present

## 2015-04-27 DIAGNOSIS — J9811 Atelectasis: Secondary | ICD-10-CM | POA: Diagnosis not present

## 2015-04-27 DIAGNOSIS — N390 Urinary tract infection, site not specified: Secondary | ICD-10-CM | POA: Diagnosis present

## 2015-04-27 DIAGNOSIS — I10 Essential (primary) hypertension: Secondary | ICD-10-CM | POA: Diagnosis not present

## 2015-04-27 DIAGNOSIS — B9789 Other viral agents as the cause of diseases classified elsewhere: Secondary | ICD-10-CM | POA: Diagnosis present

## 2015-04-27 DIAGNOSIS — I519 Heart disease, unspecified: Secondary | ICD-10-CM | POA: Diagnosis not present

## 2015-04-27 DIAGNOSIS — G9349 Other encephalopathy: Secondary | ICD-10-CM | POA: Diagnosis not present

## 2015-04-27 DIAGNOSIS — R2689 Other abnormalities of gait and mobility: Secondary | ICD-10-CM | POA: Diagnosis not present

## 2015-04-27 DIAGNOSIS — L03115 Cellulitis of right lower limb: Secondary | ICD-10-CM | POA: Diagnosis present

## 2015-04-27 DIAGNOSIS — R278 Other lack of coordination: Secondary | ICD-10-CM | POA: Diagnosis not present

## 2015-04-27 DIAGNOSIS — F419 Anxiety disorder, unspecified: Secondary | ICD-10-CM | POA: Diagnosis present

## 2015-04-27 DIAGNOSIS — F4489 Other dissociative and conversion disorders: Secondary | ICD-10-CM | POA: Diagnosis not present

## 2015-04-27 DIAGNOSIS — F0281 Dementia in other diseases classified elsewhere with behavioral disturbance: Secondary | ICD-10-CM | POA: Diagnosis present

## 2015-04-27 DIAGNOSIS — G20A1 Parkinson's disease without dyskinesia, without mention of fluctuations: Secondary | ICD-10-CM | POA: Diagnosis present

## 2015-04-27 DIAGNOSIS — R5381 Other malaise: Secondary | ICD-10-CM | POA: Diagnosis not present

## 2015-04-27 DIAGNOSIS — N3000 Acute cystitis without hematuria: Secondary | ICD-10-CM

## 2015-04-27 DIAGNOSIS — G249 Dystonia, unspecified: Secondary | ICD-10-CM

## 2015-04-27 DIAGNOSIS — R1319 Other dysphagia: Secondary | ICD-10-CM | POA: Diagnosis not present

## 2015-04-27 DIAGNOSIS — R410 Disorientation, unspecified: Secondary | ICD-10-CM

## 2015-04-27 DIAGNOSIS — Z9181 History of falling: Secondary | ICD-10-CM | POA: Diagnosis not present

## 2015-04-27 DIAGNOSIS — R4182 Altered mental status, unspecified: Secondary | ICD-10-CM | POA: Diagnosis not present

## 2015-04-27 DIAGNOSIS — G3184 Mild cognitive impairment, so stated: Secondary | ICD-10-CM | POA: Diagnosis not present

## 2015-04-27 DIAGNOSIS — G9340 Encephalopathy, unspecified: Secondary | ICD-10-CM | POA: Diagnosis not present

## 2015-04-27 DIAGNOSIS — I5189 Other ill-defined heart diseases: Secondary | ICD-10-CM | POA: Diagnosis present

## 2015-04-27 DIAGNOSIS — R627 Adult failure to thrive: Secondary | ICD-10-CM | POA: Diagnosis not present

## 2015-04-27 DIAGNOSIS — I5042 Chronic combined systolic (congestive) and diastolic (congestive) heart failure: Secondary | ICD-10-CM | POA: Diagnosis present

## 2015-04-27 DIAGNOSIS — G20 Parkinson's disease: Secondary | ICD-10-CM | POA: Diagnosis not present

## 2015-04-27 DIAGNOSIS — R41841 Cognitive communication deficit: Secondary | ICD-10-CM | POA: Diagnosis not present

## 2015-04-27 DIAGNOSIS — M6281 Muscle weakness (generalized): Secondary | ICD-10-CM | POA: Diagnosis not present

## 2015-04-27 HISTORY — DX: Cerebral infarction, unspecified: I63.9

## 2015-04-27 HISTORY — DX: Personal history of other medical treatment: Z92.89

## 2015-04-27 HISTORY — DX: Personal history of peptic ulcer disease: Z87.11

## 2015-04-27 HISTORY — DX: Unspecified chronic bronchitis: J42

## 2015-04-27 HISTORY — DX: Gastro-esophageal reflux disease without esophagitis: K21.9

## 2015-04-27 HISTORY — DX: Hypothyroidism, unspecified: E03.9

## 2015-04-27 HISTORY — DX: Sleep apnea, unspecified: G47.30

## 2015-04-27 HISTORY — DX: Pure hypercholesterolemia, unspecified: E78.00

## 2015-04-27 LAB — CBC WITH DIFFERENTIAL/PLATELET
BASOS PCT: 1 % (ref 0–1)
Basophils Absolute: 0.1 10*3/uL (ref 0.0–0.1)
EOS PCT: 2 % (ref 0–5)
Eosinophils Absolute: 0.2 10*3/uL (ref 0.0–0.7)
HCT: 42.7 % (ref 36.0–46.0)
HEMOGLOBIN: 14 g/dL (ref 12.0–15.0)
Lymphocytes Relative: 14 % (ref 12–46)
Lymphs Abs: 1.4 10*3/uL (ref 0.7–4.0)
MCH: 27.9 pg (ref 26.0–34.0)
MCHC: 32.8 g/dL (ref 30.0–36.0)
MCV: 85.1 fL (ref 78.0–100.0)
MONO ABS: 0.7 10*3/uL (ref 0.1–1.0)
Monocytes Relative: 7 % (ref 3–12)
Neutro Abs: 7.6 10*3/uL (ref 1.7–7.7)
Neutrophils Relative %: 76 % (ref 43–77)
Platelets: 147 10*3/uL — ABNORMAL LOW (ref 150–400)
RBC: 5.02 MIL/uL (ref 3.87–5.11)
RDW: 16.5 % — ABNORMAL HIGH (ref 11.5–15.5)
WBC: 10 10*3/uL (ref 4.0–10.5)

## 2015-04-27 LAB — COMPREHENSIVE METABOLIC PANEL
ALK PHOS: 67 U/L (ref 38–126)
ALT: 24 U/L (ref 14–54)
AST: 24 U/L (ref 15–41)
Albumin: 3.4 g/dL — ABNORMAL LOW (ref 3.5–5.0)
Anion gap: 8 (ref 5–15)
BUN: 19 mg/dL (ref 6–20)
CALCIUM: 9.2 mg/dL (ref 8.9–10.3)
CHLORIDE: 107 mmol/L (ref 101–111)
CO2: 26 mmol/L (ref 22–32)
Creatinine, Ser: 0.8 mg/dL (ref 0.44–1.00)
GFR calc Af Amer: >60 mL/min (ref >60)
GFR calc non Af Amer: >60 mL/min (ref >60)
GLUCOSE: 100 mg/dL — AB (ref 65–99)
Potassium: 4.3 mmol/L (ref 3.5–5.1)
Sodium: 141 mmol/L (ref 135–145)
Total Bilirubin: 0.8 mg/dL (ref 0.3–1.2)
Total Protein: 6.2 g/dL — ABNORMAL LOW (ref 6.5–8.1)

## 2015-04-27 LAB — URINE MICROSCOPIC-ADD ON

## 2015-04-27 LAB — URINALYSIS, ROUTINE W REFLEX MICROSCOPIC
BILIRUBIN URINE: NEGATIVE
Glucose, UA: NEGATIVE mg/dL
Hgb urine dipstick: NEGATIVE
KETONES UR: 15 mg/dL — AB
Nitrite: NEGATIVE
PROTEIN: NEGATIVE mg/dL
Specific Gravity, Urine: 1.024 (ref 1.005–1.030)
Urobilinogen, UA: 0.2 mg/dL (ref 0.0–1.0)
pH: 6 (ref 5.0–8.0)

## 2015-04-27 LAB — MAGNESIUM: Magnesium: 2.1 mg/dL (ref 1.7–2.4)

## 2015-04-27 LAB — TSH: TSH: 2.277 u[IU]/mL (ref 0.350–4.500)

## 2015-04-27 MED ORDER — MULTI-VITAMIN/MINERALS PO TABS
1.0000 | ORAL_TABLET | Freq: Every day | ORAL | Status: DC
  Administered 2015-04-27 – 2015-05-01 (×5): 1 via ORAL
  Filled 2015-04-27 (×5): qty 1

## 2015-04-27 MED ORDER — LATANOPROST 0.005 % OP SOLN
1.0000 [drp] | Freq: Every day | OPHTHALMIC | Status: DC
  Administered 2015-04-27 – 2015-04-30 (×4): 1 [drp] via OPHTHALMIC
  Filled 2015-04-27: qty 2.5

## 2015-04-27 MED ORDER — ACETAMINOPHEN 650 MG RE SUPP: 650.0000 mg | Freq: Four times a day (QID) | RECTAL | Status: DC | PRN

## 2015-04-27 MED ORDER — LORAZEPAM 0.5 MG PO TABS: 0.5000 mg | ORAL_TABLET | Freq: Two times a day (BID) | ORAL | Status: DC | PRN

## 2015-04-27 MED ORDER — FUROSEMIDE 40 MG PO TABS
40.0000 mg | ORAL_TABLET | Freq: Every day | ORAL | Status: DC
  Administered 2015-04-27 – 2015-05-01 (×5): 40 mg via ORAL
  Filled 2015-04-27 (×5): qty 1

## 2015-04-27 MED ORDER — DEXTROSE 5 % IV SOLN
1.0000 g | Freq: Once | INTRAVENOUS | Status: AC
  Administered 2015-04-27: 1 g via INTRAVENOUS
  Filled 2015-04-27: qty 10

## 2015-04-27 MED ORDER — IPRATROPIUM BROMIDE 0.02 % IN SOLN: 0.5000 mg | RESPIRATORY_TRACT | Status: DC | PRN

## 2015-04-27 MED ORDER — ONDANSETRON HCL 4 MG/2ML IJ SOLN: 4.0000 mg | Freq: Four times a day (QID) | INTRAMUSCULAR | Status: DC | PRN

## 2015-04-27 MED ORDER — FERROUS SULFATE 325 (65 FE) MG PO TABS
325.0000 mg | ORAL_TABLET | Freq: Every day | ORAL | Status: DC
  Administered 2015-04-27 – 2015-05-01 (×5): 325 mg via ORAL
  Filled 2015-04-27 (×7): qty 1

## 2015-04-27 MED ORDER — ALBUTEROL SULFATE (2.5 MG/3ML) 0.083% IN NEBU: 2.5000 mg | INHALATION_SOLUTION | RESPIRATORY_TRACT | Status: DC | PRN

## 2015-04-27 MED ORDER — DEXTROSE 5 % IV SOLN
1.0000 g | INTRAVENOUS | Status: DC
  Administered 2015-04-28 – 2015-04-30 (×3): 1 g via INTRAVENOUS
  Filled 2015-04-27 (×4): qty 10

## 2015-04-27 MED ORDER — PRAMIPEXOLE DIHYDROCHLORIDE 1.5 MG PO TABS
1.5000 mg | ORAL_TABLET | Freq: Three times a day (TID) | ORAL | Status: DC
  Administered 2015-04-27 – 2015-05-01 (×11): 1.5 mg via ORAL
  Filled 2015-04-27 (×14): qty 1

## 2015-04-27 MED ORDER — GI COCKTAIL ~~LOC~~
30.0000 mL | Freq: Three times a day (TID) | ORAL | Status: DC | PRN
  Administered 2015-04-29: 30 mL via ORAL
  Filled 2015-04-27: qty 30

## 2015-04-27 MED ORDER — ACETAMINOPHEN 325 MG PO TABS: 650.0000 mg | ORAL_TABLET | Freq: Four times a day (QID) | ORAL | Status: DC | PRN

## 2015-04-27 MED ORDER — OXYCODONE HCL 5 MG PO TABS: 5.0000 mg | ORAL_TABLET | Freq: Every day | ORAL | Status: DC | PRN

## 2015-04-27 MED ORDER — SODIUM CHLORIDE 0.9 % IV SOLN
INTRAVENOUS | Status: DC
  Administered 2015-04-27: 20:00:00 via INTRAVENOUS

## 2015-04-27 MED ORDER — ONDANSETRON HCL 4 MG PO TABS: 4.0000 mg | ORAL_TABLET | Freq: Four times a day (QID) | ORAL | Status: DC | PRN

## 2015-04-27 MED ORDER — CARBIDOPA-LEVODOPA 25-100 MG PO TABS
1.0000 | ORAL_TABLET | Freq: Every day | ORAL | Status: DC
  Administered 2015-04-27 – 2015-05-01 (×22): 1 via ORAL
  Filled 2015-04-27 (×22): qty 1

## 2015-04-27 MED ORDER — ENOXAPARIN SODIUM 40 MG/0.4ML ~~LOC~~ SOLN
40.0000 mg | SUBCUTANEOUS | Status: DC
  Administered 2015-04-27 – 2015-04-30 (×4): 40 mg via SUBCUTANEOUS
  Filled 2015-04-27 (×4): qty 0.4

## 2015-04-27 MED ORDER — DOCUSATE SODIUM 100 MG PO CAPS
100.0000 mg | ORAL_CAPSULE | Freq: Every day | ORAL | Status: DC
  Administered 2015-04-27 – 2015-05-01 (×5): 100 mg via ORAL
  Filled 2015-04-27 (×5): qty 1

## 2015-04-27 NOTE — ED Notes (Signed)
Off unit with xray. 

## 2015-04-27 NOTE — ED Notes (Signed)
Pt becoming increasingly paraniod stating people are in the window starring at her.  Pt confused stating she is standing up when she is lying down, pt vital are WNL, temp 99.7 rectal.  Pt thinks she is making tea for RN asking her how she wants it.

## 2015-04-27 NOTE — ED Notes (Signed)
Pt brother [name] [phone] is contact person.  Pt stated that brother is allowed information on her if he calls.

## 2015-04-27 NOTE — H&P (Signed)
[facility] History and Physical  [name] WCH:[id number] DOB: 08-27-1941 DOA: 04/27/2015  Referring physician: Dr. Venora Maples PCP: Hollace Kinnier, DO   Chief Complaint: confusion  HPI: [name] is a 74 y.o. female  With history of Parkinson's disease, hypertension, chronic systolic heart failure, anxiety who was recently hospitalized from 04/23/2015 through 04/26/2015 for generalized weakness and falls with failure to thrive. During the hospitalization patient was seen by neurology and patient was started on amantadine in addition to a Sinemet for Parkinson's disease secondary to dyskinesias.Patient was discharged home with home health therapies. Patient presents back to the ED with confusion. History is obtained from both patient and per the ED physician's records.it is noted. The physician's record the patient was found sitting at edge of her bed by [employer] allegedly the talking to individuals who were not actually there. EMS was called to transport the patient to the hospital. It was noted that patient was having visual hallucinations. Patient however states that her brother had coming side on the floor at her house and was just staring in space. Patient states her brother brought to [place] within. And was asking for help. Patient stated that [employer] came by and the brother wouldn't get up to open the dome and a such that when next oh and got a nephew who came by and called the police and let them in the door. The ED record EMS denied any Boys in the room when they got there. In the emergency room patient was also noted to become increasingly paranoid stating that people are in the window staring at her. Patient was also noted to state she was standing up while she was lying down. Patient also thought that she was making T4 the nurse. Urinalysis which was done was worrisome for UTI. BASIC metabolic profile obtained was unremarkable. CBC obtained had a white count of 10.0  platelet count 147 otherwise was within normal limits. Chest x-ray which was done was negative for any acute infiltrate. Patient was given a dose of IV Rocephin. [facility] were called to admit the patient for further evaluation and management.   Review of Systems: as per history of present illness otherwise negative. Constitutional:  No weight loss, night sweats, Fevers, chills, fatigue.  HEENT:  No headaches, Difficulty swallowing,Tooth/dental problems,Sore throat,  No sneezing, itching, ear ache, nasal congestion, post nasal drip,  Cardio-vascular:  No chest pain, Orthopnea, PND, swelling in lower extremities, anasarca, dizziness, palpitations  GI:  No heartburn, indigestion, abdominal pain, nausea, vomiting, diarrhea, change in bowel habits, loss of appetite  Resp:  No shortness of breath with exertion or at rest. No excess mucus, no productive cough, No non-productive cough, No coughing up of blood.No change in color of mucus.No wheezing.No chest wall deformity  Skin:  no rash or lesions.  GU:  no dysuria, change in color of urine, no urgency or frequency. No flank pain.  Musculoskeletal:  No joint pain or swelling. No decreased range of motion. No back pain.  Psych:  No change in mood or affect. No depression or anxiety. No memory loss.   Past Medical History  Diagnosis Date  . Parkinson's disease   . Hypertension   . Arthritis   . Anemia   . Cellulitis of lower leg 12/24/2011  . Chronic systolic heart failure   . Acute on chronic systolic heart failure   . Cellulitis and abscess of leg, except foot   . Hypotension, unspecified   . Helicobacter  pylori (H. pylori)   . CHF (congestive heart failure)   . Anxiety   . Cataract left  . Glaucoma left  . GERD (gastroesophageal reflux disease)   . Hypercholesterolemia   . Chronic bronchitis     "usually get it q yr"  . Sleep apnea     "had test years ago; insurance wouldn't pay for mask so I never had one" (04/27/2015)    . Hypothyroidism   . History of blood transfusion     "don't remember why"  . History of bleeding peptic ulcer   . Stroke X 3    "that  was the reason I got Parkinson's"   Past Surgical History  Procedure Laterality Date  . Hemiarthroplasty hip Right 09/24/2007    [facility] Endo 01/23/2011  . Thyroidectomy    . Esophagogastroduodenoscopy  09/26/2011    Procedure: ESOPHAGOGASTRODUODENOSCOPY (EGD);  Surgeon: Zenovia Jarred, MD;  Location: [name] ENDOSCOPY;  Service: Gastroenterology;  Laterality: N/A;  To be done at bedside.  . Esophagogastroduodenoscopy N/A 09/30/2013    Procedure: ESOPHAGOGASTRODUODENOSCOPY (EGD);  Surgeon: [name], MD;  Location: [place];  Service: Endoscopy;  Laterality: N/A;  . Esophagogastroduodenoscopy N/A 11/25/2013    Procedure: ESOPHAGOGASTRODUODENOSCOPY (EGD);  Surgeon: [name], MD;  Location: [facility] ENDOS[facility];  Service: Endoscopy;  Laterality: N/A;  . Tonsillectomy    . Nissen fundoplication      "had OR for GERD"  . Tendon repair      Gluteus medius [facility] 01/23/2011  . Back surgery    . Kyphoplasty  06/2009    T12/notes 07/17/2009   Social History:  reports that she has never smoked. She has never used smokeless tobacco. She reports that she does not drink alcohol or use illicit drugs.  Allergies  Allergen Reactions  . Codeine Other (See Comments)    Unknown allergic reaction  . Penicillins Other (See Comments)    Pt passed out in doctor's office after penicillin dose    Family History  Problem Relation Age of Onset  . GER disease Mother   . Heart disease Father     Prior to Admission medications   Medication Sig Start Date End Date Taking? Authorizing Provider  amantadine (SYMMETREL) 100 MG capsule Take 1 capsule (100 mg total) by mouth daily. 04/26/15   Bonnielee Haff, MD  carbidopa-levodopa (SINEMET IR) 25-100 MG per tablet Take 1 tablet by mouth 6 (six) times daily. Take at 6a, 9a, 12n, 3p, 6pm, 9pm    Historical Provider, MD  docusate sodium  (COLACE) 100 MG capsule Take 100 mg by mouth daily.     Historical Provider, MD  Ferrous Sulfate (IRON) 325 (65 FE) MG TABS Take 1 capsule by mouth daily. Patient taking differently: Take 325 mg by mouth daily.  12/16/14   Lavon Paganini Angiulli, PA-C  furosemide (LASIX) 40 MG tablet Take 40 mg by mouth daily.    Historical Provider, MD  gabapentin (NEURONTIN) 100 MG capsule Take 1 capsule (100 mg total) by mouth 3 (three) times daily. Patient not taking: Reported on 04/23/2015 01/12/15   Lauree Chandler, NP  latanoprost (XALATAN) 0.005 % ophthalmic solution Place 1 drop into the left eye daily at 6 PM.  02/23/15   Historical Provider, MD  LORazepam (ATIVAN) 0.5 MG tablet Take 1 tablet (0.5 mg total) by mouth 2 (two) times daily as needed for anxiety. 12/16/14   Lavon Paganini Angiulli, PA-C  Multiple Vitamins-Minerals (MULTIVITAMIN WITH MINERALS) tablet Take 1 tablet by mouth daily.  Historical Provider, MD  oxyCODONE (OXY IR/ROXICODONE) 5 MG immediate release tablet Take 1 tablet (5 mg total) by mouth every 4 (four) hours as needed for moderate pain. Patient taking differently: Take 5 mg by mouth daily as needed for severe pain.  12/16/14   Lavon Paganini Angiulli, PA-C  Polyethyl Glycol-Propyl Glycol (SYSTANE OP) Place 1 drop into the right eye daily at 6 PM.    Historical Provider, MD  pramipexole (MIRAPEX) 1.5 MG tablet Take 1 tablet (1.5 mg total) by mouth 3 (three) times daily. 12/16/14   Lavon Paganini Angiulli, PA-C  senna-docusate (SENOKOT-S) 8.6-50 MG per tablet Take 1 tablet by mouth 2 (two) times daily. Patient not taking: Reported on 04/23/2015 12/16/14   Cathlyn Parsons, PA-C   Physical Exam: Filed Vitals:   04/27/15 1515 04/27/15 1530 04/27/15 1600 04/27/15 1637  BP: 135/97 137/112 160/75 144/77  Pulse: 97 88 88 78  Temp:    98.3 F (36.8 C)  TempSrc:    Oral  Resp: 21 18 20 20   Height:      Weight:    73.211 kg (161 lb 6.4 oz)  SpO2: 96% 95% 95% 96%    Wt Readings from Last 3 Encounters:    04/27/15 73.211 kg (161 lb 6.4 oz)  04/23/15 76.204 kg (168 lb)  04/03/15 76.567 kg (168 lb 12.8 oz)    General:  Well-developed well-nourished no acute cardiopulmonary distress. Speaking in full sentences. Eyes: PERRLA, EOMI, normal lids, irises & conjunctiva ENT: grossly normal hearing, lips & tongue Neck: no LAD, masses or thyromegaly Cardiovascular: RRR, no m/r/g. No LE edema. Respiratory: CTA bilaterally, no w/r/r. Normal respiratory effort. Abdomen: soft, ntnd, positive bowel sounds, no rebound, no guarding Skin: no rash or induration seen on limited exam Musculoskeletal: grossly normal tone BUE/BLE Psychiatric: grossly normal mood and affect, speech fluent and appropriate Neurologic: alert and oriented 3. Cranial nerves II through XII are grossly intact. No focal deficits.          Labs on Admission:  Basic Metabolic Panel:  Recent Labs Lab 04/23/15 1814 04/23/15 2025 04/24/15 0609 04/26/15 0545 04/27/15 1410  NA 141  --  137 137 141  K 4.1  --  4.1 4.1 4.3  CL 103  --  110 106 107  CO2  --   --  24 22 26   GLUCOSE 95  --  91 118* 100*  BUN 18  --  12 10 19   CREATININE 0.90 0.98 0.83 0.85 0.80  CALCIUM  --   --  8.3* 8.5* 9.2  MG  --  1.9  --   --   --   PHOS  --  3.7  --   --   --    Liver Function Tests:  Recent Labs Lab 04/24/15 0609 04/27/15 1410  AST 30 24  ALT 6* 24  ALKPHOS 52 67  BILITOT 1.0 0.8  PROT 5.3* 6.2*  ALBUMIN 2.9* 3.4*   No results for input(s): LIPASE, AMYLASE in the last 168 hours. No results for input(s): AMMONIA in the last 168 hours. CBC:  Recent Labs Lab 04/23/15 1743 04/23/15 1814 04/23/15 2025 04/24/15 0609 04/26/15 0545 04/27/15 1410  WBC 7.7  --  7.7 5.7 7.5 10.0  NEUTROABS 5.6  --   --   --   --  7.6  HGB 13.0 13.6 12.5 12.2 13.9 14.0  HCT 39.3 40.0 38.2 38.1 41.9 42.7  MCV 84.7  --  85.1 85.8 84.6 85.1  PLT 146*  --  128* 103* 127* 147*   Cardiac Enzymes: No results for input(s): CKTOTAL, CKMB, CKMBINDEX,  TROPONINI in the last 168 hours.  BNP (last 3 results) No results for input(s): BNP in the last 8760 hours.  ProBNP (last 3 results) No results for input(s): PROBNP in the last 8760 hours.  CBG: No results for input(s): GLUCAP in the last 168 hours.  Radiological Exams on Admission: Dg Chest 2 View  04/27/2015   CLINICAL DATA:  Altered mental status/delirium  EXAM: CHEST  2 VIEW  COMPARISON:  April 23, 2015 and November 07, 2012  FINDINGS: There is atelectasis in the left base. Lungs elsewhere clear. Heart size and pulmonary vascularity are normal. No adenopathy. Aorta is mildly prominent. There are stable compression fractures in the mid to lower thoracic spine region. 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  IMPRESSION: Left base atelectasis. No edema or consolidation. Mild aortic prominence is a stable finding that is likely indicative of chronic hypertension. There are multiple thoracic compression fractures with thoracolumbar levoscoliosis.   Electronically Signed   By: Lowella Grip III M.D.   On: 04/27/2015 14:00    EKG: Independently reviewed. Ventricular trigeminy. Borderline ST T-wave changes prolonged QT interval.  Assessment/Plan Principal Problem:   Acute encephalopathy Active Problems:   Dyskinesia   UTI (urinary tract infection): Probable   Parkinson's disease   Essential hypertension   Diastolic dysfunction, grade 2   Debility   Acute delirium  #1 acute encephalopathy Patient presenting with hallucinations per ED report. May be secondary to metabolic encephalopathy secondary to probable urinary tract infection as UA is worrisome for urinary tract infection. Will check blood cultures 2. Chest x-ray is negative for any acute infiltrate. Patient was also started on amantadine during her last hospitalization which could potentially lead to hallucinations and as such will not resume patient's amantadine. Will place patient on IV Rocephin. Follow. If no improvement may need  imaging of her head and possible psychiatric evaluation. Will follow for now.  #2 probable UTI Check a urine culture. Place on IV Rocephin.  #3 Parkinson's disease with the dyskinesia Continue Sinemet. Will discontinue amantadine for now secondary to problem #1. Will need to follow patient. Outpatient follow-up.  #4 hypertension Stable.continue home regimen of Lasix.  #5 chronic diastolic CHF Stable. Patient appears euvolemic. Continue home dose Lasix.  #6 debility PT/OT.  #7 prophylaxis PPI for GI prophylaxis. Lovenox for DVT prophylaxis.   Code Status: full code as per prior admission however patient does have a MOST form from 02/11/2014 that was a DO NOT RESUSCITATE. Will need to discuss with family or with patient when confusion has cleared. DVT Prophylaxis: Lovenox Family Communication: no family at bedside. Tried to call Brother [name] however no answer. Disposition Plan: admit to MedSurg.  Time spent: 65 minutes  Natonya Finstad,DANIELM.D. [facility] Pager [phone]

## 2015-04-27 NOTE — ED Notes (Signed)
Pt found by [employer] confused and sitting on bed talking to someone that was not there.  Pt states two little boys where in her room, one in the TV and one on the phone.  EMS denies any boys in her room, although pt states she sees them "right there".   Hx of parkinsons and CHF, recently admitted with cellulitis of leg.  Pt has no medical complaints and is confused why she is here, and why EMS left the little boys locked in her house.

## 2015-04-27 NOTE — ED Provider Notes (Signed)
CSN: [id number]     Arrival date & time 04/27/15  1156 History   First MD Initiated Contact with Patient 04/27/15 1158     Chief Complaint  Patient presents with  . Altered Mental Status     (Consider location/radiation/quality/duration/timing/severity/associated sxs/prior Treatment) HPI Comments: Patient with history of Parkinson's disease since 1990s presents with complaint of delirium. Patient was recently admitted to hospital for multiple falls in the setting of lower extremity cellulitis. Patient was discharged home yesterday. She was seen by neurology in hospital who adjusted her medications. Today patient was found sitting on the edge of her bed by [employer]. She was allegedly talking to individuals who were not actually there. EMS was called to transport the patient to the hospital. Patient admits to seeing her son and nephew along with 2 small children in her home. She states that the police locked the children in her house when they brought her to the hospital. I spoke with the patient's brother, [name], he states that it is very unusual for patient to have visual hallucinations. Sometimes she becomes confused shortly after waking up after having a dream but this is not her baseline. Patient denies any medical complaints.   During previous admission, admission to skilled nursing facility was advised however this was not able to be obtained due to finances. Home health was maximized. Patient has not yet began to receive these services.  The history is provided by the patient, medical records and a relative.    Past Medical History  Diagnosis Date  . Parkinson's disease   . Hypertension   . Arthritis   . Anemia   . Cellulitis of lower leg 12/24/2011  . Acute bronchitis   . Other abnormal blood chemistry   . Chronic systolic heart failure   . Acute on chronic systolic heart failure   . Cellulitis and abscess of leg, except foot   . Hypotension, unspecified   . Helicobacter  pylori (H. pylori)   . Acute gastric ulcer with hemorrhage, without mention of obstruction   . CHF (congestive heart failure)   . Anxiety   . Cataract left  . Glaucoma left   Past Surgical History  Procedure Laterality Date  . Hip arthroscopy w/ labral repair    . Thyroidectomy    . Esophagogastroduodenoscopy  09/26/2011    Procedure: ESOPHAGOGASTRODUODENOSCOPY (EGD);  Surgeon: Zenovia Jarred, MD;  Location[facility] ENDOSCOPY;  Service: Gastroenterology;  Laterality: N/A;  To be done at bedside.  . Esophagogastroduodenoscopy N/A 09/30/2013    Procedure: ESOPHAGOGASTRODUODENOSCOPY (EGD);  Surgeon: [name], MD;  Location: [place];  Service: Endoscopy;  Laterality: N/A;  . Esophagogastroduodenoscopy N/A 11/25/2013    Procedure: ESOPHAGOGASTRODUODENOSCOPY (EGD);  Surgeon: [name], MD;  Location: [facility] ENDOSCOPY;  Service: Endoscopy;  Laterality: N/A;   Family History  Problem Relation Age of Onset  . GER disease Mother   . Heart disease Father    History  Substance Use Topics  . Smoking status: Never Smoker   . Smokeless tobacco: Never Used  . Alcohol Use: No   OB History    No data available     Review of Systems  Constitutional: Negative for fever.  HENT: Negative for rhinorrhea and sore throat.   Eyes: Negative for redness.  Respiratory: Negative for cough.   Cardiovascular: Negative for chest pain.  Gastrointestinal: Negative for nausea, vomiting, abdominal pain and diarrhea.  Genitourinary: Negative for dysuria.  Musculoskeletal: Negative for myalgias.  Skin: Negative for rash.  Neurological: Negative for headaches.  Psychiatric/Behavioral: Positive for hallucinations and confusion. Negative for suicidal ideas, sleep disturbance and self-injury.      Allergies  Codeine and Penicillins  Home Medications   Prior to Admission medications   Medication Sig Start Date End Date Taking? Authorizing Provider  amantadine (SYMMETREL) 100 MG capsule Take 1 capsule (100  mg total) by mouth daily. 04/26/15   Bonnielee Haff, MD  carbidopa-levodopa (SINEMET IR) 25-100 MG per tablet Take 1 tablet by mouth 6 (six) times daily. Take at 6a, 9a, 12n, 3p, 6pm, 9pm    Historical Provider, MD  docusate sodium (COLACE) 100 MG capsule Take 100 mg by mouth daily.     Historical Provider, MD  Ferrous Sulfate (IRON) 325 (65 FE) MG TABS Take 1 capsule by mouth daily. Patient taking differently: Take 325 mg by mouth daily.  12/16/14   Lavon Paganini Angiulli, PA-C  furosemide (LASIX) 40 MG tablet Take 40 mg by mouth daily.    Historical Provider, MD  gabapentin (NEURONTIN) 100 MG capsule Take 1 capsule (100 mg total) by mouth 3 (three) times daily. Patient not taking: Reported on 04/23/2015 01/12/15   Lauree Chandler, NP  latanoprost (XALATAN) 0.005 % ophthalmic solution Place 1 drop into the left eye daily at 6 PM.  02/23/15   Historical Provider, MD  LORazepam (ATIVAN) 0.5 MG tablet Take 1 tablet (0.5 mg total) by mouth 2 (two) times daily as needed for anxiety. 12/16/14   Lavon Paganini Angiulli, PA-C  Multiple Vitamins-Minerals (MULTIVITAMIN WITH MINERALS) tablet Take 1 tablet by mouth daily.    Historical Provider, MD  oxyCODONE (OXY IR/ROXICODONE) 5 MG immediate release tablet Take 1 tablet (5 mg total) by mouth every 4 (four) hours as needed for moderate pain. Patient taking differently: Take 5 mg by mouth daily as needed for severe pain.  12/16/14   Lavon Paganini Angiulli, PA-C  Polyethyl Glycol-Propyl Glycol (SYSTANE OP) Place 1 drop into the right eye daily at 6 PM.    Historical Provider, MD  pramipexole (MIRAPEX) 1.5 MG tablet Take 1 tablet (1.5 mg total) by mouth 3 (three) times daily. 12/16/14   Lavon Paganini Angiulli, PA-C  senna-docusate (SENOKOT-S) 8.6-50 MG per tablet Take 1 tablet by mouth 2 (two) times daily. Patient not taking: Reported on 04/23/2015 12/16/14   Lavon Paganini Angiulli, PA-C   BP 140/76 mmHg  Pulse 76  Temp(Src) 97.9 F (36.6 C) (Oral)  Resp 22  Ht 5\' 4"  (1.626 m)  Wt 167 lb  (75.751 kg)  BMI 28.65 kg/m2  SpO2 96%   Physical Exam  Constitutional: She appears well-developed and well-nourished.  HENT:  Head: Normocephalic and atraumatic.  Eyes: Conjunctivae are normal. Right eye exhibits no discharge. Left eye exhibits no discharge.  Neck: Normal range of motion. Neck supple.  Cardiovascular: Normal rate, regular rhythm and normal heart sounds.   Pulmonary/Chest: Effort normal and breath sounds normal.  Abdominal: Soft. There is no tenderness.  Neurological: She is alert.  Skin: Skin is warm and dry.  Psychiatric: She has a normal mood and affect. Her speech is normal. Thought content normal. She is actively hallucinating. She is not agitated, not aggressive, not hyperactive, not slowed, not withdrawn and not combative. She is attentive.  Nursing note and vitals reviewed.   ED Course  Procedures (including critical care time) Labs Review Labs Reviewed  CBC WITH DIFFERENTIAL/PLATELET - Abnormal; Notable for the following:    RDW 16.5 (*)    Platelets 147 (*)  All other components within normal limits  COMPREHENSIVE METABOLIC PANEL - Abnormal; Notable for the following:    Glucose, Bld 100 (*)    Total Protein 6.2 (*)    Albumin 3.4 (*)    All other components within normal limits  URINALYSIS, ROUTINE W REFLEX MICROSCOPIC (NOT AT [facility]) - Abnormal; Notable for the following:    APPearance CLOUDY (*)    Ketones, ur 15 (*)    Leukocytes, UA LARGE (*)    All other components within normal limits  URINE MICROSCOPIC-ADD ON - Abnormal; Notable for the following:    Squamous Epithelial / LPF MANY (*)    Bacteria, UA MANY (*)    All other components within normal limits  URINE CULTURE    Imaging Review Dg Chest 2 View  04/27/2015   CLINICAL DATA:  Altered mental status/delirium  EXAM: CHEST  2 VIEW  COMPARISON:  April 23, 2015 and November 07, 2012  FINDINGS: There is atelectasis in the left base. Lungs elsewhere clear. Heart size and pulmonary  vascularity are normal. No adenopathy. Aorta is mildly prominent. There are stable compression fractures in the mid to lower thoracic spine region. 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  IMPRESSION: Left base atelectasis. No edema or consolidation. Mild aortic prominence is a stable finding that is likely indicative of chronic hypertension. There are multiple thoracic compression fractures with thoracolumbar levoscoliosis.   Electronically Signed   By: Lowella Grip III M.D.   On: 04/27/2015 14:00     EKG Interpretation   Date/Time:  Thursday April 27 2015 12:01:11 EDT Ventricular Rate:  82 PR Interval:  126 QRS Duration: 92 QT Interval:  420 QTC Calculation: 490 R Axis:   4 Text Interpretation:  Sinus rhythm Ventricular trigeminy Borderline ST  depression, lateral leads Borderline prolonged QT interval No significant  change was found Confirmed by CAMPOS  MD, KEVIN ([id number]) on 04/27/2015  12:49:43 PM       12:55 PM Patient seen and examined. Discussed with and seen by Dr. Venora Maples. Work-up initiated.   Vital signs reviewed and are as follows: BP 140/76 mmHg  Pulse 76  Temp(Src) 97.9 F (36.6 C) (Oral)  Resp 22  Ht 5\' 4"  (1.626 m)  Wt 167 lb (75.751 kg)  BMI 28.65 kg/m2  SpO2 96%  1:29 PM Spoke with patient's brother, [name], on the phone to corroborate history.   3:41 PM Patient has what appears to be worsening UTI. Also new medication.   MDM   Final diagnoses:  Acute delirium   Admit. Possible causes infection vs medication reaction.     Carlisle Cater, PA-C 04/27/15 [place], MD 04/27/15 [phone]

## 2015-04-28 LAB — BASIC METABOLIC PANEL
ANION GAP: 10 (ref 5–15)
BUN: 16 mg/dL (ref 6–20)
CO2: 27 mmol/L (ref 22–32)
Calcium: 8.8 mg/dL — ABNORMAL LOW (ref 8.9–10.3)
Chloride: 103 mmol/L (ref 101–111)
Creatinine, Ser: 0.92 mg/dL (ref 0.44–1.00)
GFR calc Af Amer: >60 mL/min (ref >60)
GFR calc non Af Amer: 60 mL/min — ABNORMAL LOW (ref >60)
GLUCOSE: 92 mg/dL (ref 65–99)
POTASSIUM: 3.8 mmol/L (ref 3.5–5.1)
SODIUM: 140 mmol/L (ref 135–145)

## 2015-04-28 LAB — CBC
HCT: 40 % (ref 36.0–46.0)
HEMOGLOBIN: 13 g/dL (ref 12.0–15.0)
MCH: 28 pg (ref 26.0–34.0)
MCHC: 32.5 g/dL (ref 30.0–36.0)
MCV: 86.2 fL (ref 78.0–100.0)
Platelets: 135 10*3/uL — ABNORMAL LOW (ref 150–400)
RBC: 4.64 MIL/uL (ref 3.87–5.11)
RDW: 16.8 % — ABNORMAL HIGH (ref 11.5–15.5)
WBC: 7.8 10*3/uL (ref 4.0–10.5)

## 2015-04-28 MED ORDER — VANCOMYCIN HCL 10 G IV SOLR
1250.0000 mg | INTRAVENOUS | Status: DC
  Administered 2015-04-28 – 2015-04-30 (×3): 1250 mg via INTRAVENOUS
  Filled 2015-04-28 (×5): qty 1250

## 2015-04-28 NOTE — Evaluation (Signed)
Clinical/Bedside Swallow Evaluation Patient Details  Name: [name] MRN: [id number] Date of Birth: 08-24-1941  Today's Date: 04/28/2015 Time: SLP Start Time (ACUTE ONLY): 23 SLP Stop Time (ACUTE ONLY): 1256 SLP Time Calculation (min) (ACUTE ONLY): 16 min  Past Medical History:  Past Medical History  Diagnosis Date  . Parkinson's disease   . Hypertension   . Arthritis   . Anemia   . Cellulitis of lower leg 12/24/2011  . Chronic systolic heart failure   . Acute on chronic systolic heart failure   . Cellulitis and abscess of leg, except foot   . Hypotension, unspecified   . Helicobacter pylori (H. pylori)   . CHF (congestive heart failure)   . Anxiety   . Cataract left  . Glaucoma left  . GERD (gastroesophageal reflux disease)   . Hypercholesterolemia   . Chronic bronchitis     "usually get it q yr"  . Sleep apnea     "had test years ago; insurance wouldn't pay for mask so I never had one" (04/27/2015)  . Hypothyroidism   . History of blood transfusion     "don't remember why"  . History of bleeding peptic ulcer   . Stroke X 3    "that  was the reason I got Parkinson's"   Past Surgical History:  Past Surgical History  Procedure Laterality Date  . Hemiarthroplasty hip Right 09/24/2007    Archie Endo 01/23/2011  . Thyroidectomy    . Esophagogastroduodenoscopy  09/26/2011    Procedure: ESOPHAGOGASTRODUODENOSCOPY (EGD);  Surgeon: Zenovia Jarred, MD;  Location: [facility] ENDOSCOPY;  Service: Gastroenterology;  Laterality: N/A;  To be done at bedside.  . Esophagogastroduodenoscopy N/A 09/30/2013    Procedure: ESOPHAGOGASTRODUODENOSCOPY (EGD);  Surgeon: [name], MD;  Location: [place];  Service: Endoscopy;  Laterality: N/A;  . Esophagogastroduodenoscopy N/A 11/25/2013    Procedure: ESOPHAGOGASTRODUODENOSCOPY (EGD);  Surgeon: [name], MD;  Location: [facility] ENDOSCOPY;  Service: Endoscopy;  Laterality: N/A;  . Tonsillectomy    . Nissen fundoplication      "had OR for GERD"  . Tendon  repair      Gluteus medius Archie Endo 01/23/2011  . Back surgery    . Kyphoplasty  06/2009    T12/notes 07/17/2009   HPI:  History of Parkinson's disease, hypertension, chronic systolic heart failure, anxiety who was recently hospitalized from 04/23/2015 through 04/26/2015 for generalized weakness and falls with failure to thrive. Patient was discharged home with home health therapies. Patient presents back to the ED on 04/27/15 with confusion. Chest x-ray was negative for any acute infiltrate.  Orders received for bedside swallow evaluation.    Assessment / Plan / Recommendation Clinical Impression   Bedside swallow evaluation complete.  Patient presents with missing detention, generalized oral weakness and decreased coordination due to her tremor.  This results in prolonged mastication with mild reside in oral cavity post swallow.  Thin liquids were consumed with swift swallow trigger and no overt s/s of aspiration.  Patient also reports intermittent globus sensation as baseline and that eating takes her a long time.  Recommend diet downgrade to Dys.3, soft solids and continuation of thin liquids.  Recommend SLP follow up x1 for toleration and education, given that this will likely be her diet upon discharge.      Aspiration Risk  Mild    Diet Recommendation Dysphagia 3 (Mech soft);Thin   Medication Administration: Whole meds with liquid Compensations: Slow rate;Small sips/bites    Other  Recommendations Oral Care Recommendations: Oral care  BID   Follow Up Recommendations   TBD   Frequency and Duration min 1 x/week  1 week   Pertinent Vitals/Pain none    SLP Swallow Goals  see care plan   Swallow Study Prior Functional Status   Patient reports consuming regular and thin liquids slowly due to globus concerns.    General Other Pertinent Information: History of Parkinson's disease, hypertension, chronic systolic heart failure, anxiety who was recently hospitalized from 04/23/2015  through 04/26/2015 for generalized weakness and falls with failure to thrive. Patient was discharged home with home health therapies. Patient presents back to the ED on 04/27/15 with confusion. Chest x-ray was negative for any acute infiltrate.  Orders received for bedside swallow evaluation.  Type of Study: Bedside swallow evaluation Previous Swallow Assessment: none on record Diet Prior to this Study: Regular;Thin liquids Temperature Spikes Noted: No Respiratory Status: Room air History of Recent Intubation: No Behavior/Cognition: Alert;Cooperative;Pleasant mood Oral Cavity - Dentition: Missing dentition Self-Feeding Abilities: Able to feed self;Needs set up Patient Positioning: Upright in bed Baseline Vocal Quality: Low vocal intensity Volitional Cough: Weak Volitional Swallow: Able to elicit    Oral/Motor/Sensory Function Overall Oral Motor/Sensory Function: Other (comment) (weakness and tremor )   Ice Chips Ice chips: Not tested   Thin Liquid Thin Liquid: Within functional limits Presentation: Self Fed;Straw Pharyngeal  Phase Impairments: Multiple swallows    Nectar Thick Nectar Thick Liquid: Not tested   Honey Thick Honey Thick Liquid: Not tested   Puree Puree: Within functional limits Presentation: Self Fed;Spoon   Solid   GO    Solid: Impaired Presentation: Self Fed Oral Phase Impairments: Impaired mastication Other Comments: patient reports intermittent globus sensation after solids       Gunnar Fusi, M.A., CCC-SLP [phone]  Harith Mccadden 04/28/2015,1:51 PM

## 2015-04-28 NOTE — Care Management Note (Signed)
Case Management Note  Patient Details  Name: [name] MRN: [id number] Date of Birth: [birth year]  Subjective/Objective:                 Patient lives at home alone, family checks on her frequently. She receives meals on wheels. Has a walker at home. Has received HH from [facility] in the past and would prefer them if needed at discharge. Patient discharged from hospital earlier this week with home health services, presented through ED with AMS and has been febrile this admission.    Action/Plan:  Anticipate discharge to [name].  Expected Discharge Date:                  Expected Discharge Plan:  Skilled Nursing Facility  In-House Referral:  Clinical Social Work  Discharge planning Services  CM Consult  Post Acute Care Choice:    Choice offered to:     DME Arranged:    DME Agency:     HH Arranged:    [place] Agency:     Status of Service:  In process, will continue to follow  Medicare Important Message Given:    Date Medicare IM Given:    Medicare IM give by:    Date Additional Medicare IM Given:    Additional Medicare Important Message give by:     If discussed at [place] of Stay Meetings, dates discussed:    Additional Comments:  Carles Collet, RN 04/28/2015, 11:14 AM

## 2015-04-28 NOTE — Evaluation (Signed)
Occupational Therapy Evaluation Patient Details Name: [name] MRN: [id number] DOB: 1941-01-17 Today's Date: 04/28/2015    History of Present Illness With history of Parkinson's disease, hypertension, chronic systolic heart failure, anxiety who was recently hospitalized from 04/23/2015 through 04/26/2015 for generalized weakness and falls with failure to thrive. During the hospitalization patient was seen by neurology and patient was started on amantadine in addition to a Sinemet for Parkinson's disease secondary to dyskinesias.Patient was discharged home with home health therapies. Admitted this time due to confusion and hallucinations and found to have acute encephalopathy probably due to UTI   Clinical Impression   This 74 yo female admitted with above presents to acute OT with decreased balance and decreased mobility affecting her ability to care for herself home alone. She will benefit from acute OT with follow up OT at [name] to get back to her Mod I level and return home. Of note, at least this PM dyskinetic movements are much better than they were last admission.    Follow Up Recommendations  [name]    Equipment Recommendations  None recommended by OT       Precautions / Restrictions Precautions Precautions: Fall Restrictions Weight Bearing Restrictions: No      Mobility Bed Mobility Overal bed mobility: Needs Assistance Bed Mobility: Supine to Sit     Supine to sit: Min assist     General bed mobility comments: Up to left side with HOB up and use of rail  Transfers Overall transfer level: Needs assistance Equipment used: 1 person hand held assist Transfers: Sit to/from [facility] Sit to Stand: Min assist Stand pivot transfers: Min assist       General transfer comment: Increased time for pt to get her "bearings" and then turn to sit in recliner    Balance Overall balance assessment: Needs assistance Sitting-balance support: No upper extremity  supported;Feet supported Sitting balance-Leahy Scale: Fair     Standing balance support: Single extremity supported Standing balance-Leahy Scale: Poor                              ADL Overall ADL's : Needs assistance/impaired Eating/Feeding: Independent;Sitting   Grooming: Set up;Supervision/safety;Sitting   Upper Body Bathing: Set up;Supervision/ safety;Sitting   Lower Body Bathing: Moderate assistance (with min A sit<>stand)   Upper Body Dressing : Supervision/safety;Set up;Sitting   Lower Body Dressing: Maximal assistance (with min A sit<>stand)   Toilet Transfer: Minimal assistance;Stand-pivot (bed>recliner with increased time)   Toileting- Clothing Manipulation and Hygiene: Maximal assistance (with min A sit<>stand)               Vision Additional Comments: No change from baseline          Pertinent Vitals/Pain Pain Assessment: No/denies pain     Hand Dominance Right   Extremity/Trunk Assessment Upper Extremity Assessment Upper Extremity Assessment: Overall WFL for tasks assessed (only miminal dyskinesia movements this session compared to last admission when they were severe)   Lower Extremity Assessment Lower Extremity Assessment: Defer to PT evaluation       Communication Communication Communication: No difficulties (last admission by dysarthric due to dyskinesias)   Cognition Arousal/Alertness: Awake/alert Behavior During Therapy: WFL for tasks assessed/performed Overall Cognitive Status: Within Functional Limits for tasks assessed                                Home  Living Family/patient expects to be discharged to:: Skilled nursing facility Living Arrangements: Alone Available Help at Discharge: Family;Available PRN/intermittently Type of Home: House Home Access: Ramped entrance     Home Layout: One level     Bathroom Shower/Tub: Tub/shower unit;Curtain Shower/tub characteristics: Architectural technologist:  Standard (elevated toilet seat) Bathroom Accessibility: Yes How Accessible: Accessible via walker Home Equipment: [place] - 4 wheels;Bedside commode;Tub bench;Wheelchair - Media planner Comments: pt lives alone, brothers get her groceries for her, she has Meals on Wheels for lunch and one of her brothers brings her supper.      Prior Functioning/Environment Level of Independence: Independent with assistive device(s)        Comments: Uses rollator. Pt with recent falls.    OT Diagnosis: Generalized weakness   OT Problem List: Decreased strength;Impaired balance (sitting and/or standing);Obesity;Impaired tone   OT Treatment/Interventions: Self-care/ADL training;Patient/family education;Balance training;DME and/or AE instruction;Therapeutic activities    OT Goals(Current goals can be found in the care plan section) Acute Rehab OT Goals Patient Stated Goal: to rehab then home OT Goal Formulation: With patient Time For Goal Achievement: 05/05/15 Potential to Achieve Goals: Good  OT Frequency: Min 2X/week   Barriers to D/C: Decreased caregiver support             End of Session Equipment Utilized During Treatment:  (none) Nurse Communication:  (NT: mobility status (she was familiar with pt from her stay earlier in week) and pt with chair alarm)  Activity Tolerance:   Patient left: in chair;with call bell/phone within reach;with chair alarm set   Time: 1416-1436 OT Time Calculation (min): 20 min Charges:  OT General Charges $OT Visit: 1 Procedure OT Evaluation $Initial OT Evaluation Tier I: 1 Procedure  Almon Register [phone] 04/28/2015, 2:50 PM

## 2015-04-28 NOTE — Progress Notes (Signed)
Chaplain responded to consult  Chaplain explored faith and belief issues with patient.  Patient would like to have daily devotional time with chaplain going forward.   04/28/15 0900  Clinical Encounter Type  Visited With Patient;Health care provider  Visit Type Initial;Spiritual support  Referral From Nurse  Consult/Referral To Chaplain  Spiritual Encounters  Spiritual Needs Prayer;Emotional

## 2015-04-28 NOTE — Progress Notes (Signed)
ANTIBIOTIC CONSULT NOTE - INITIAL  Pharmacy Consult for Vancomycin Indication: cellulitis  Allergies  Allergen Reactions  . Codeine Other (See Comments)    Unknown allergic reaction  . Penicillins Other (See Comments)    Pt passed out in doctor's office after penicillin dose    Patient Measurements: Height: 5\' 4"  (162.6 cm) Weight: 160 lb 8 oz (72.802 kg) IBW/kg (Calculated) : 54.7  Vital Signs: Temp: 98 F (36.7 C) (08/05 0541) Temp Source: Oral (08/05 0541) BP: 129/65 mmHg (08/05 0541) Pulse Rate: 65 (08/05 0541) Intake/Output from previous day: 08/04 0701 - 08/05 0700 In: 648.8 [I.V.:648.8] Out: 850 [Urine:850] Intake/Output from this shift: Total I/O In: -  Out: 250 [Urine:250]  Labs:  Recent Labs  04/26/15 0545 04/27/15 1410 04/28/15 0534  WBC 7.5 10.0 7.8  HGB 13.9 14.0 13.0  PLT 127* 147* 135*  CREATININE 0.85 0.80 0.92   Estimated Creatinine Clearance: 52.4 mL/min (by C-G formula based on Cr of 0.92). No results for input(s): VANCOTROUGH, VANCOPEAK, VANCORANDOM, GENTTROUGH, GENTPEAK, GENTRANDOM, TOBRATROUGH, TOBRAPEAK, TOBRARND, AMIKACINPEAK, AMIKACINTROU, AMIKACIN in the last 72 hours.   Microbiology: Recent Results (from the past 720 hour(s))  Culture, Urine     Status: None   Collection Time: 04/23/15  5:37 PM  Result Value Ref Range Status   Specimen Description URINE, RANDOM  Final   Special Requests ADDED [id number] [phone]  Final   Culture MULTIPLE SPECIES PRESENT, SUGGEST RECOLLECTION  Final   Report Status 04/25/2015 FINAL  Final  Urine culture     Status: None   Collection Time: 04/24/15  2:43 PM  Result Value Ref Range Status   Specimen Description URINE, CLEAN CATCH  Final   Special Requests NONE  Final   Culture MULTIPLE SPECIES PRESENT, SUGGEST RECOLLECTION  Final   Report Status 04/25/2015 FINAL  Final    Medical History: Past Medical History  Diagnosis Date  . Parkinson's disease   . Hypertension   . Arthritis   . Anemia   .  Cellulitis of lower leg 12/24/2011  . Chronic systolic heart failure   . Acute on chronic systolic heart failure   . Cellulitis and abscess of leg, except foot   . Hypotension, unspecified   . Helicobacter pylori (H. pylori)   . CHF (congestive heart failure)   . Anxiety   . Cataract left  . Glaucoma left  . GERD (gastroesophageal reflux disease)   . Hypercholesterolemia   . Chronic bronchitis     "usually get it q yr"  . Sleep apnea     "had test years ago; insurance wouldn't pay for mask so I never had one" (04/27/2015)  . Hypothyroidism   . History of blood transfusion     "don't remember why"  . History of bleeding peptic ulcer   . Stroke X 3    "that  was the reason I got Parkinson's"    Assessment: 74 year old female admitted with confusion currently on Rocephin, now adding Vancomycin for cellulitis.  She has a history of Parkinson's disease, HTN, CHF, anxiety  Goal of Therapy:  Vancomycin trough level 10-15 mcg/ml  Plan:  Rocephin 1 gram iv Q 24 hours Vancomycin 1250 mg iv Q 24 hours Follow up progress, fever trend, cultures  Thank you Anette Guarneri, PharmD [phone]   04/28/2015,10:21 AM

## 2015-04-28 NOTE — Progress Notes (Signed)
Utilization Review completed. Marleni Gallardo RN BSN CM 

## 2015-04-28 NOTE — Evaluation (Signed)
Physical Therapy Evaluation Patient Details Name: [name] MRN: [id number] DOB: October 22, 1940 Today's Date: 04/28/2015   History of Present Illness  74 y.o. female with history of Parkinson's disease, HTN, chronic systolic heart failure, and anxiety who was recently hospitalized from 7/31-04/26/15 for generalized weakness and falls due to FTT.  She was readmitted due to confusion and hallucinations and dx with acute encephalopathy due to UTI.    Clinical Impression  Pt was able to ambulate short distances in room with RW.  She has significant gait abnormalities due to dyskinesias related to Parkinson's that put her at a very high fall risk.  She is not safe to return home alone and is appropriate for [name] placement at discharge.   PT to follow acutely for deficits listed below.       Follow Up Recommendations [name]    Equipment Recommendations  None recommended by PT    Recommendations for Other Services   NA    Precautions / Restrictions Precautions Precautions: Fall Restrictions Weight Bearing Restrictions: No      Mobility  Bed Mobility Overal bed mobility: Needs Assistance Bed Mobility: Supine to Sit     Supine to sit: Min assist     General bed mobility comments: Up to left side with HOB up and use of rail  Transfers Overall transfer level: Needs assistance Equipment used: Rolling walker (2 wheeled) Transfers: Sit to/from Stand Sit to Stand: Min assist Stand pivot transfers: Min assist       General transfer comment: Verbal cues for safe hand placment and not to sit prematurely when fatigued.  Ambulation/Gait Ambulation/Gait assistance: Mod assist;Min assist Ambulation Distance (Feet): 10 Feet (x3) Assistive device: Rolling walker (2 wheeled) Gait Pattern/deviations: Step-through pattern;Narrow base of support;Scissoring;Trunk flexed Gait velocity: decreased Gait velocity interpretation: Below normal speed for age/gender General Gait Details: Parkinsonian gait  pattern with rigidity, freezing and narrow BOS at times tangling in her own feet.  She leans to the right.  Max verbal cues for upright posture which she can only hold for a few seconds and for "BIG" steps.          Balance Overall balance assessment: Needs assistance Sitting-balance support: No upper extremity supported;Feet supported Sitting balance-Leahy Scale: Fair     Standing balance support: Bilateral upper extremity supported Standing balance-Leahy Scale: Poor                               Pertinent Vitals/Pain Pain Assessment: No/denies pain    Home Living Family/patient expects to be discharged to:: Skilled nursing facility Living Arrangements: Alone Available Help at Discharge: Family;Available PRN/intermittently Type of Home: House Home Access: Ramped entrance     Home Layout: One level Home Equipment: [place] - 4 wheels;Bedside commode;Tub bench;Wheelchair - Biomedical engineer Comments: pt lives alone, brothers get her groceries for her, she has Meals on Wheels for lunch and one of her brothers brings her supper.    Prior Function Level of Independence: Independent with assistive device(s)         Comments: Uses rollator. Pt with recent falls.     Hand Dominance   Dominant Hand: Right    Extremity/Trunk Assessment   Upper Extremity Assessment: Defer to OT evaluation           Lower Extremity Assessment: Generalized weakness      Cervical / Trunk Assessment: Other exceptions;Kyphotic  Communication   Communication: Other (comment);Expressive difficulties (dysarthric due to  dyskinesias)  Cognition Arousal/Alertness: Awake/alert Behavior During Therapy: WFL for tasks assessed/performed Overall Cognitive Status: Within Functional Limits for tasks assessed (not specifically tested)                               Assessment/Plan    PT Assessment Patient needs continued PT services  PT Diagnosis  Difficulty walking;Abnormality of gait;Generalized weakness   PT Problem List Decreased strength;Decreased activity tolerance;Decreased balance;Decreased mobility;Decreased coordination;Decreased knowledge of use of DME  PT Treatment Interventions DME instruction;Gait training;Stair training;Functional mobility training;Therapeutic activities;Balance training;Therapeutic exercise;Neuromuscular re-education;Cognitive remediation;Patient/family education   PT Goals (Current goals can be found in the Care Plan section) Acute Rehab PT Goals Patient Stated Goal: to rehab and then home PT Goal Formulation: With patient Time For Goal Achievement: 05/12/15 Potential to Achieve Goals: Good    Frequency Min 2X/week   Barriers to discharge Decreased caregiver support         End of Session   Activity Tolerance: Patient limited by fatigue Patient left: in chair;with call bell/phone within reach           Time: 1694-5038 PT Time Calculation (min) (ACUTE ONLY): 15 min   Charges:   PT Evaluation $Initial PT Evaluation Tier I: 1 Procedure          Shiraz Bastyr B. [place], [place], DPT [phone]   04/28/2015, 5:13 PM

## 2015-04-28 NOTE — Clinical Social Work Placement (Signed)
   CLINICAL SOCIAL WORK PLACEMENT  NOTE  Date:  04/28/2015  Patient Details  Name: ANASTASIJA ANFINSON MRN: [id number] Date of Birth: 1941-04-22  Clinical Social Work is seeking post-discharge placement for this patient at the [place] level of care (*CSW will initial, date and re-position this form in  chart as items are completed):  Yes   Patient/family provided with [facility] Work Department's list of facilities offering this level of care within the geographic area requested by the patient (or if unable, by the patient's family).  Yes   Patient/family informed of their freedom to choose among providers that offer the needed level of care, that participate in Medicare, Medicaid or managed care program needed by the patient, have an available bed and are willing to accept the patient.  Yes   Patient/family informed of [facility] ownership interest in [facility] and [facility], as well as of the fact that they are under no obligation to receive care at these facilities.  PASRR submitted to EDS on 04/28/15     PASRR number received on 04/28/15     Existing PASRR number confirmed on       FL2 transmitted to all facilities in geographic area requested by pt/family on 04/28/15     FL2 transmitted to all facilities within larger geographic area on       Patient informed that his/her managed care company has contracts with or will negotiate with certain facilities, including the following:        Yes   Patient/family informed of bed offers received.  Patient chooses bed at  ([place] )     Physician recommends and patient chooses bed at      Patient to be transferred to  ([place] ) on  .  Patient to be transferred to facility by       Patient family notified on   of transfer.  Name of family member notified:        PHYSICIAN Please sign FL2     Additional Comment:    _______________________________________________ Glendon Axe, MSW, [place] [phone] 04/28/2015 3:27 PM

## 2015-04-28 NOTE — Clinical Social Work Note (Signed)
Clinical Social Work Assessment  Patient Details  Name: [name] MRN: [id number] Date of Birth: 1941-04-25  Date of referral:  04/28/15               Reason for consult:  Facility Placement, Discharge Planning                Permission sought to share information with:  Case Manager, Customer service manager, Family Supports Permission granted to share information::  Yes, Verbal Permission Granted  Name::      Beaulah Dinning )  Agency::   ([name]'s)  Relationship::   (Brother )  Contact Information:   [phone])  Housing/Transportation Living arrangements for the past 2 months:  [place] of Information:  Patient Patient Interpreter Needed:  None Criminal Activity/Legal Involvement Pertinent to Current Situation/Hospitalization:  No - Comment as needed Significant Relationships:  Siblings Lives with:  Self Do you feel safe going back to the place where you live?  No Need for family participation in patient care:  Yes (Comment)  Care giving concerns:  Patient requiring 24 hour assistance/supervision.  Social Worker assessment / plan: Holiday representative met with patient at bedside in reference to post-acute placement for [name]. CSW introduced CSW role and [name] process. CSW also reviewed and provided [name] list with bed offers. Patient reported she lives at home alone however her brother, [name] visits often to check in on her. Patient stated she would like to be placed at [facility] due to being close to her church. Patinet has bed offer at [facility] and Elsie will contact [facility] to notify staff of pt's interest. CSW also explained 3 night qualifying stay with Medicare coverage. Pt explained understanding. No further concerns reported at this time. CSW will continue to follow pt and pt's family for continued support and to facilitate pt's discharge needs once medically stable.   Employment status:  Retired Health visitor, Managed  Care PT Recommendations:  [place] / Referral to community resources:  [place]  Patient/Family's Response to care:  Pt sitting at bedside alert and oriented x4. Pt agreeable to placement at [facility]. Pt pleasant and appreciated social work intervention.  Patient/Family's Understanding of and Emotional Response to Diagnosis, Current Treatment, and Prognosis:  CSW unclear of pt's understanding of medical diagnosis. Pt understands she will discharge to [facility] once stable. Pt also realizes it is not safe for her to return home alone. CSW remains available as needed.  Emotional Assessment Appearance:  Appears stated age Attitude/Demeanor/Rapport:   (Pleasant ) Affect (typically observed):  Accepting, Appropriate, Pleasant Orientation:  Oriented to Self, Oriented to Place, Oriented to  Time, Oriented to Situation Alcohol / Substance use:    Psych involvement (Current and /or in the community):  No (Comment)  Discharge Needs  Concerns to be addressed:  Care Coordination, Home Safety Concerns Readmission within the last 30 days:  Yes Current discharge risk:  Dependent with Mobility, Lives alone Barriers to Discharge:  Continued Medical Work up   [employer], MSW, LCSWA [phone] 04/28/2015 3:25 PM

## 2015-04-28 NOTE — Progress Notes (Signed)
PATIENT DETAILS Name: [name] Age: 74 y.o. Sex: female Date of Birth: Jun 15, 1941 Admit Date: 04/27/2015 Admitting Physician Eugenie Filler, MD TFT:[id number], [name], DO  Subjective: Seems to be much more awake and alert. Has erythema in her right lower extremity. Dyskinesias are chronic.  Assessment/Plan: Principal Problem: Acute encephalopathy: Multifactorial for her to be from UTI/cellulitis and recent starting of amantadine. Continue antibiotics, agree with discontinuing amantadine. Seems to have improved, awake and mostly alert and actually answers almost all my questions. Suspect will require [name] on discharge. PT/SLP eval ordered  Active Problems: Suspected UTI: Continue Rocephin, await urine cultures. Afebrile and without any leukocytosis. Mental status much improved.  Mild right lower extremity cellulitis: Start vancomycin. Follow   Parkinson's disease with the dyskinesia: Chronic issue, continue Sinemet. Spoke with Dr. Janann Colonel, who saw the patient a few days back,-dyskinesias are chronic and severe. He has no further recommendations apart from discontinuing amantadine given AMS. Will need outpatient follow-up with neurology  Essential hypertension: BP controlled, continue Lasix  Diastolic dysfunction, grade 2: Compensated, continue Lasix  Significant deconditioning: PT eval, likely require [name]  Disposition: Remain inpatient  Antimicrobial agents  See below  Anti-infectives    Start     Dose/Rate Route Frequency Ordered Stop   04/28/15 1500  cefTRIAXone (ROCEPHIN) 1 g in dextrose 5 % 50 mL IVPB     1 g 100 mL/hr over 30 Minutes Intravenous Every 24 hours 04/27/15 1931     04/27/15 1530  cefTRIAXone (ROCEPHIN) 1 g in dextrose 5 % 50 mL IVPB     1 g 100 mL/hr over 30 Minutes Intravenous  Once 04/27/15 1515 04/27/15 1610      DVT Prophylaxis: Prophylactic Lovenox   Code Status: Full code   Family Communication None at  bedside  Procedures: None  CONSULTS:  None  Time spent 30 minutes-Greater than 50% of this time was spent in counseling, explanation of diagnosis, planning of further management, and coordination of care.  MEDICATIONS: Scheduled Meds: . carbidopa-levodopa  1 tablet Oral 6 X Daily  . cefTRIAXone (ROCEPHIN)  IV  1 g Intravenous Q24H  . docusate sodium  100 mg Oral Daily  . enoxaparin (LOVENOX) injection  40 mg Subcutaneous Q24H  . ferrous sulfate  325 mg Oral Daily  . furosemide  40 mg Oral Daily  . latanoprost  1 drop Left Eye q1800  . multivitamin with minerals  1 tablet Oral Daily  . pramipexole  1.5 mg Oral TID   Continuous Infusions: . sodium chloride 75 mL/hr at 04/27/15 2021   PRN Meds:.acetaminophen **OR** acetaminophen, albuterol, gi cocktail, ipratropium, LORazepam, ondansetron **OR** ondansetron (ZOFRAN) IV, oxyCODONE    PHYSICAL EXAM: Vital signs in last 24 hours: Filed Vitals:   04/27/15 1637 04/27/15 2151 04/28/15 0541 04/28/15 0545  BP: 144/77 120/98 129/65   Pulse: 78 71 65   Temp: 98.3 F (36.8 C) 99 F (37.2 C) 98 F (36.7 C)   TempSrc: Oral Oral Oral   Resp: 20 20 19    Height:      Weight: 73.211 kg (161 lb 6.4 oz)   72.802 kg (160 lb 8 oz)  SpO2: 96% 95% 97%     Weight change:  Filed Weights   04/27/15 1222 04/27/15 1637 04/28/15 0545  Weight: 75.751 kg (167 lb) 73.211 kg (161 lb 6.4 oz) 72.802 kg (160 lb 8 oz)   Body mass index is 27.54  kg/(m^2).   Gen Exam: Awake and alert with clear speech.  Resting tremors with significant dyskinesias Neck: Supple, No JVD.  Chest: B/L Clear.   CVS: S1 S2 Regular, no murmurs.  Abdomen: soft, BS +, non tender, non distended.  Extremities: no edema, lower extremities warm to touch. Neurologic: Non Focal-but with generalized weakness Skin: No Rash.   Wounds: N/A.   Intake/Output from previous day:  Intake/Output Summary (Last 24 hours) at 04/28/15 0956 Last data filed at 04/28/15 0500  Gross per 24  hour  Intake 648.75 ml  Output    850 ml  Net -201.25 ml     LAB RESULTS: CBC  Recent Labs Lab 04/23/15 1743  04/23/15 2025 04/24/15 0609 04/26/15 0545 04/27/15 1410 04/28/15 0534  WBC 7.7  --  7.7 5.7 7.5 10.0 7.8  HGB 13.0  < > 12.5 12.2 13.9 14.0 13.0  HCT 39.3  < > 38.2 38.1 41.9 42.7 40.0  PLT 146*  --  128* 103* 127* 147* 135*  MCV 84.7  --  85.1 85.8 84.6 85.1 86.2  MCH 28.0  --  27.8 27.5 28.1 27.9 28.0  MCHC 33.1  --  32.7 32.0 33.2 32.8 32.5  RDW 16.3*  --  16.4* 16.6* 16.1* 16.5* 16.8*  LYMPHSABS 1.1  --   --   --   --  1.4  --   MONOABS 0.8  --   --   --   --  0.7  --   EOSABS 0.1  --   --   --   --  0.2  --   BASOSABS 0.1  --   --   --   --  0.1  --   < > = values in this interval not displayed.  Chemistries   Recent Labs Lab 04/23/15 1814 04/23/15 2025 04/24/15 0609 04/26/15 0545 04/27/15 1410 04/27/15 2112 04/28/15 0534  NA 141  --  137 137 141  --  140  K 4.1  --  4.1 4.1 4.3  --  3.8  CL 103  --  110 106 107  --  103  CO2  --   --  24 22 26   --  27  GLUCOSE 95  --  91 118* 100*  --  92  BUN 18  --  12 10 19   --  16  CREATININE 0.90 0.98 0.83 0.85 0.80  --  0.92  CALCIUM  --   --  8.3* 8.5* 9.2  --  8.8*  MG  --  1.9  --   --   --  2.1  --     CBG: No results for input(s): GLUCAP in the last 168 hours.  GFR Estimated Creatinine Clearance: 52.4 mL/min (by C-G formula based on Cr of 0.92).  Coagulation profile No results for input(s): INR, PROTIME in the last 168 hours.  Cardiac Enzymes No results for input(s): CKMB, TROPONINI, MYOGLOBIN in the last 168 hours.  Invalid input(s): CK  Invalid input(s): POCBNP No results for input(s): DDIMER in the last 72 hours. No results for input(s): HGBA1C in the last 72 hours. No results for input(s): CHOL, HDL, LDLCALC, TRIG, CHOLHDL, LDLDIRECT in the last 72 hours.  Recent Labs  04/27/15 2112  TSH 2.277   No results for input(s): VITAMINB12, FOLATE, FERRITIN, TIBC, IRON, RETICCTPCT in  the last 72 hours. No results for input(s): LIPASE, AMYLASE in the last 72 hours.  Urine Studies No results for input(s): UHGB, CRYS in the  last 72 hours.  Invalid input(s): UACOL, UAPR, USPG, UPH, UTP, UGL, UKET, UBIL, UNIT, UROB, ULEU, UEPI, UWBC, URBC, UBAC, CAST, UCOM, BILUA  MICROBIOLOGY: Recent Results (from the past 240 hour(s))  Culture, Urine     Status: None   Collection Time: 04/23/15  5:37 PM  Result Value Ref Range Status   Specimen Description URINE, RANDOM  Final   Special Requests ADDED [id number]  Final   Culture MULTIPLE SPECIES PRESENT, SUGGEST RECOLLECTION  Final   Report Status 04/25/2015 FINAL  Final  Urine culture     Status: None   Collection Time: 04/24/15  2:43 PM  Result Value Ref Range Status   Specimen Description URINE, CLEAN CATCH  Final   Special Requests NONE  Final   Culture MULTIPLE SPECIES PRESENT, SUGGEST RECOLLECTION  Final   Report Status 04/25/2015 FINAL  Final    RADIOLOGY STUDIES/RESULTS: Dg Chest 2 View  04/27/2015   CLINICAL DATA:  Altered mental status/delirium  EXAM: CHEST  2 VIEW  COMPARISON:  April 23, 2015 and November 07, 2012  FINDINGS: There is atelectasis in the left base. Lungs elsewhere clear. Heart size and pulmonary vascularity are normal. No adenopathy. Aorta is mildly prominent. There are stable compression fractures in the mid to lower thoracic spine region. 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  IMPRESSION: Left base atelectasis. No edema or consolidation. Mild aortic prominence is a stable finding that is likely indicative of chronic hypertension. There are multiple thoracic compression fractures with thoracolumbar levoscoliosis.   Electronically Signed   By: Lowella Grip III M.D.   On: 04/27/2015 14:00   Dg Pelvis Portable  04/23/2015   CLINICAL DATA:  Fall  EXAM: PORTABLE PELVIS 1-2 VIEWS  COMPARISON:  None.  FINDINGS: Prior right hip replacement. No acute bony abnormality. Specifically, no fracture, subluxation, or  dislocation. Soft tissues are intact.  IMPRESSION: No acute bony abnormality.   Electronically Signed   By: Rolm Baptise M.D.   On: 04/23/2015 17:15   Dg Chest Portable 1 View  04/23/2015   CLINICAL DATA:  Evaluate pneumonia.  History of hypertension.  EXAM: PORTABLE CHEST - 1 VIEW  COMPARISON:  07/30/2013  FINDINGS: Low lung volumes. No confluent airspace opacities. Heart is borderline in size, accentuated by the low volumes and portable nature of the study. No effusions. No acute bony abnormality.  IMPRESSION: Low lung volumes.  No acute findings.   Electronically Signed   By: Rolm Baptise M.D.   On: 04/23/2015 17:15    Oren Binet, MD  [facility] Pager:[phone]  If 7PM-7AM, please contact night-coverage [email or web address] Password [email or web address] 04/28/2015, 9:56 AM   LOS: 1 day

## 2015-04-29 LAB — URINE CULTURE

## 2015-04-29 MED ORDER — WHITE PETROLATUM GEL
Status: AC
  Administered 2015-04-29: 0.2
  Filled 2015-04-29: qty 1

## 2015-04-29 NOTE — Progress Notes (Signed)
PATIENT DETAILS Name: [name] Age: 74 y.o. Sex: female Date of Birth: November 26, 1940 Admit Date: 04/27/2015 Admitting Physician Eugenie Filler, MD YIA:XKPV, [name], DO  Brief narrative:  74 year old female with history of Parkinson's disease with severe dyskinesia-just discharged from this facility with home health services (unable to go to [name] because of insurance issues) brought back to the hospital for evaluation of altered mental status. Altered mental status felt to be multifactorial from UTI/cellulitis and initiation of amantadine. Currently much better after discontinuing amantadine and initiation of IV antibiotics.Plans are for [name] on 8/8.  Subjective: Awake and alert. Less erythema in her right lower extremity. Dyskinesias are chronic-and seem to be less today  Assessment/Plan: Principal Problem: Acute encephalopathy: Multifactorial for her to be from UTI/cellulitis and recent starting of amantadine. Continue antibiotics, agree with discontinuing amantadine. EMS has resolved, patient is awake and alert. [name] on 8/8.   Active Problems: Suspected UTI: Continue Rocephin, await urine cultures. Encephalopathy has resolved  improved.  Mild right lower extremity cellulitis: Continue vancomycin. Erythema significantly improved and almost resolved. Suspect could be transitioned to oral doxycycline in the next 1-2 days.  Parkinson's disease with the dyskinesia: Chronic issue, continue Sinemet. Spoke with Dr. Janann Colonel on 8/5, who saw the patient a few days back,-dyskinesias are chronic and severe. He has no further recommendations apart from discontinuing amantadine given AMS. Will need outpatient follow-up with neurology  Essential hypertension: BP controlled, continue Lasix  Diastolic dysfunction, grade 2: Compensated, continue Lasix  Significant deconditioning: PT eval completed- [name] on 8/8  Disposition: Remain inpatient-[name] on 8/8  Antimicrobial agents  See  below  Anti-infectives    Start     Dose/Rate Route Frequency Ordered Stop   04/28/15 1500  cefTRIAXone (ROCEPHIN) 1 g in dextrose 5 % 50 mL IVPB     1 g 100 mL/hr over 30 Minutes Intravenous Every 24 hours 04/27/15 1931     04/28/15 1130  vancomycin (VANCOCIN) 1,250 mg in sodium chloride 0.9 % 250 mL IVPB     1,250 mg 166.7 mL/hr over 90 Minutes Intravenous Every 24 hours 04/28/15 1026     04/27/15 1530  cefTRIAXone (ROCEPHIN) 1 g in dextrose 5 % 50 mL IVPB     1 g 100 mL/hr over 30 Minutes Intravenous  Once 04/27/15 1515 04/27/15 1610      DVT Prophylaxis: Prophylactic Lovenox   Code Status: Full code   Family Communication None at bedside  Procedures: None  CONSULTS:  None  Time spent 30 minutes-Greater than 50% of this time was spent in counseling, explanation of diagnosis, planning of further management, and coordination of care.  MEDICATIONS: Scheduled Meds: . carbidopa-levodopa  1 tablet Oral 6 X Daily  . cefTRIAXone (ROCEPHIN)  IV  1 g Intravenous Q24H  . docusate sodium  100 mg Oral Daily  . enoxaparin (LOVENOX) injection  40 mg Subcutaneous Q24H  . ferrous sulfate  325 mg Oral Daily  . furosemide  40 mg Oral Daily  . latanoprost  1 drop Left Eye q1800  . multivitamin with minerals  1 tablet Oral Daily  . pramipexole  1.5 mg Oral TID  . vancomycin  1,250 mg Intravenous Q24H   Continuous Infusions:   PRN Meds:.acetaminophen **OR** acetaminophen, albuterol, gi cocktail, ipratropium, LORazepam, ondansetron **OR** ondansetron (ZOFRAN) IV, oxyCODONE    PHYSICAL EXAM: Vital signs in last 24 hours: Filed Vitals:   04/28/15 1415 04/28/15 2137  04/29/15 0452 04/29/15 0500  BP: 101/51 110/64 145/71   Pulse: 70 65 59   Temp: 98.6 F (37 C) 99.1 F (37.3 C) 97.6 F (36.4 C)   TempSrc: Oral Oral Oral   Resp: 20 22 16    Height:      Weight:    72.712 kg (160 lb 4.8 oz)  SpO2: 96% 97% 95%     Weight change: -3.039 kg (-6 lb 11.2 oz) Filed Weights    04/27/15 1637 04/28/15 0545 04/29/15 0500  Weight: 73.211 kg (161 lb 6.4 oz) 72.802 kg (160 lb 8 oz) 72.712 kg (160 lb 4.8 oz)   Body mass index is 27.5 kg/(m^2).   Gen Exam: Awake and alert with clear speech.  Resting tremors with significant dyskinesias Neck: Supple, No JVD.  Chest: B/L Clear.   CVS: S1 S2 Regular, no murmurs.  Abdomen: soft, BS +, non tender, non distended.  Extremities: no edema, lower extremities warm to touch. Neurologic: Non Focal-but with generalized weakness Skin: No Rash.   Wounds: N/A.   Intake/Output from previous day:  Intake/Output Summary (Last 24 hours) at 04/29/15 1141 Last data filed at 04/29/15 1125  Gross per 24 hour  Intake   1204 ml  Output   1825 ml  Net   -621 ml     LAB RESULTS: CBC  Recent Labs Lab 04/23/15 1743  04/23/15 2025 04/24/15 0609 04/26/15 0545 04/27/15 1410 04/28/15 0534  WBC 7.7  --  7.7 5.7 7.5 10.0 7.8  HGB 13.0  < > 12.5 12.2 13.9 14.0 13.0  HCT 39.3  < > 38.2 38.1 41.9 42.7 40.0  PLT 146*  --  128* 103* 127* 147* 135*  MCV 84.7  --  85.1 85.8 84.6 85.1 86.2  MCH 28.0  --  27.8 27.5 28.1 27.9 28.0  MCHC 33.1  --  32.7 32.0 33.2 32.8 32.5  RDW 16.3*  --  16.4* 16.6* 16.1* 16.5* 16.8*  LYMPHSABS 1.1  --   --   --   --  1.4  --   MONOABS 0.8  --   --   --   --  0.7  --   EOSABS 0.1  --   --   --   --  0.2  --   BASOSABS 0.1  --   --   --   --  0.1  --   < > = values in this interval not displayed.  Chemistries   Recent Labs Lab 04/23/15 1814 04/23/15 2025 04/24/15 0609 04/26/15 0545 04/27/15 1410 04/27/15 2112 04/28/15 0534  NA 141  --  137 137 141  --  140  K 4.1  --  4.1 4.1 4.3  --  3.8  CL 103  --  110 106 107  --  103  CO2  --   --  24 22 26   --  27  GLUCOSE 95  --  91 118* 100*  --  92  BUN 18  --  12 10 19   --  16  CREATININE 0.90 0.98 0.83 0.85 0.80  --  0.92  CALCIUM  --   --  8.3* 8.5* 9.2  --  8.8*  MG  --  1.9  --   --   --  2.1  --     CBG: No results for input(s): GLUCAP in  the last 168 hours.  GFR Estimated Creatinine Clearance: 52.4 mL/min (by C-G formula based on Cr of 0.92).  Coagulation profile  No results for input(s): INR, PROTIME in the last 168 hours.  Cardiac Enzymes No results for input(s): CKMB, TROPONINI, MYOGLOBIN in the last 168 hours.  Invalid input(s): CK  Invalid input(s): POCBNP No results for input(s): DDIMER in the last 72 hours. No results for input(s): HGBA1C in the last 72 hours. No results for input(s): CHOL, HDL, LDLCALC, TRIG, CHOLHDL, LDLDIRECT in the last 72 hours.  Recent Labs  04/27/15 2112  TSH 2.277   No results for input(s): VITAMINB12, FOLATE, FERRITIN, TIBC, IRON, RETICCTPCT in the last 72 hours. No results for input(s): LIPASE, AMYLASE in the last 72 hours.  Urine Studies No results for input(s): UHGB, CRYS in the last 72 hours.  Invalid input(s): UACOL, UAPR, USPG, UPH, UTP, UGL, UKET, UBIL, UNIT, UROB, ULEU, UEPI, UWBC, URBC, UBAC, CAST, UCOM, BILUA  MICROBIOLOGY: Recent Results (from the past 240 hour(s))  Culture, Urine     Status: None   Collection Time: 04/23/15  5:37 PM  Result Value Ref Range Status   Specimen Description URINE, RANDOM  Final   Special Requests ADDED [id number]  Final   Culture MULTIPLE SPECIES PRESENT, SUGGEST RECOLLECTION  Final   Report Status 04/25/2015 FINAL  Final  Urine culture     Status: None   Collection Time: 04/24/15  2:43 PM  Result Value Ref Range Status   Specimen Description URINE, CLEAN CATCH  Final   Special Requests NONE  Final   Culture MULTIPLE SPECIES PRESENT, SUGGEST RECOLLECTION  Final   Report Status 04/25/2015 FINAL  Final  Urine culture     Status: None   Collection Time: 04/27/15  2:16 PM  Result Value Ref Range Status   Specimen Description URINE, CLEAN CATCH  Final   Special Requests NONE  Final   Culture MULTIPLE SPECIES PRESENT, SUGGEST RECOLLECTION  Final   Report Status 04/29/2015 FINAL  Final    RADIOLOGY STUDIES/RESULTS: Dg Chest 2  View  04/27/2015   CLINICAL DATA:  Altered mental status/delirium  EXAM: CHEST  2 VIEW  COMPARISON:  April 23, 2015 and November 07, 2012  FINDINGS: There is atelectasis in the left base. Lungs elsewhere clear. Heart size and pulmonary vascularity are normal. No adenopathy. Aorta is mildly prominent. There are stable compression fractures in the mid to lower thoracic spine region. 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  IMPRESSION: Left base atelectasis. No edema or consolidation. Mild aortic prominence is a stable finding that is likely indicative of chronic hypertension. There are multiple thoracic compression fractures with thoracolumbar levoscoliosis.   Electronically Signed   By: Lowella Grip III M.D.   On: 04/27/2015 14:00   Dg Pelvis Portable  04/23/2015   CLINICAL DATA:  Fall  EXAM: PORTABLE PELVIS 1-2 VIEWS  COMPARISON:  None.  FINDINGS: Prior right hip replacement. No acute bony abnormality. Specifically, no fracture, subluxation, or dislocation. Soft tissues are intact.  IMPRESSION: No acute bony abnormality.   Electronically Signed   By: Rolm Baptise M.D.   On: 04/23/2015 17:15   Dg Chest Portable 1 View  04/23/2015   CLINICAL DATA:  Evaluate pneumonia.  History of hypertension.  EXAM: PORTABLE CHEST - 1 VIEW  COMPARISON:  07/30/2013  FINDINGS: Low lung volumes. No confluent airspace opacities. Heart is borderline in size, accentuated by the low volumes and portable nature of the study. No effusions. No acute bony abnormality.  IMPRESSION: Low lung volumes.  No acute findings.   Electronically Signed   By: Rolm Baptise M.D.   On: 04/23/2015  17:15    Oren Binet, MD  [facility] Pager:[phone]  If 7PM-7AM, please contact night-coverage [email or web address] Password [email or web address] 04/29/2015, 11:41 AM   LOS: 2 days

## 2015-04-30 DIAGNOSIS — G9340 Encephalopathy, unspecified: Secondary | ICD-10-CM

## 2015-04-30 NOTE — Progress Notes (Signed)
PATIENT DETAILS Name: [name] Age: 74 y.o. Sex: female Date of Birth: Nov 08, 1940 Admit Date: 04/27/2015 Admitting Physician Eugenie Filler, MD CVE:LFYB, [name], DO  Brief narrative:   74 year old female with history of Parkinson's disease with severe dyskinesia-just discharged from this facility with home health services (unable to go to [name] because of insurance issues) brought back to the hospital for evaluation of altered mental status. Altered mental status felt to be multifactorial from UTI/cellulitis and initiation of amantadine. Currently much better after discontinuing amantadine and initiation of IV antibiotics.Plans are for [name] on 8/8.  Subjective:  Awake and alert. Less erythema in her right lower extremity. Dyskinesias are chronic-and seem to be less today.  Assessment/Plan:  Acute encephalopathy: Multifactorial for her to be from UTI/cellulitis and recent starting of amantadine. Continue antibiotics, agree with discontinuing amantadine. EMS has resolved, patient is awake and alert. [name] on 8/8.   Suspected UTI: Continue Rocephin, await urine cultures. Encephalopathy has resolved  improved.  Mild right lower extremity cellulitis: Continue vancomycin. Erythema significantly improved and almost resolved. Suspect could be transitioned to oral doxycycline in the next 1-2 days.  Parkinson's disease with the dyskinesia: Chronic issue, continue Sinemet. Spoke with Dr. Janann Colonel on 8/5, who saw the patient a few days back,-dyskinesias are chronic and severe. He has no further recommendations apart from discontinuing amantadine given AMS. Will need outpatient follow-up with neurology  Essential hypertension: BP controlled, continue Lasix  Diastolic dysfunction, grade 2: Compensated, continue Lasix  Significant deconditioning: PT eval completed- [name] on 8/8    Disposition: Remain inpatient-[name] on 8/8  Antimicrobial agents   Anti-infectives    Start      Dose/Rate Route Frequency Ordered Stop   04/28/15 1500  cefTRIAXone (ROCEPHIN) 1 g in dextrose 5 % 50 mL IVPB     1 g 100 mL/hr over 30 Minutes Intravenous Every 24 hours 04/27/15 1931     04/28/15 1130  vancomycin (VANCOCIN) 1,250 mg in sodium chloride 0.9 % 250 mL IVPB     1,250 mg 166.7 mL/hr over 90 Minutes Intravenous Every 24 hours 04/28/15 1026     04/27/15 1530  cefTRIAXone (ROCEPHIN) 1 g in dextrose 5 % 50 mL IVPB     1 g 100 mL/hr over 30 Minutes Intravenous  Once 04/27/15 1515 04/27/15 1610       DVT Prophylaxis: Prophylactic Lovenox   Code Status: Full code   Family Communication None at bedside  Procedures: None  CONSULTS:  None  Time spent 30 minutes-Greater than 50% of this time was spent in counseling, explanation of diagnosis, planning of further management, and coordination of care.  MEDICATIONS: Scheduled Meds: . carbidopa-levodopa  1 tablet Oral 6 X Daily  . cefTRIAXone (ROCEPHIN)  IV  1 g Intravenous Q24H  . docusate sodium  100 mg Oral Daily  . enoxaparin (LOVENOX) injection  40 mg Subcutaneous Q24H  . ferrous sulfate  325 mg Oral Daily  . furosemide  40 mg Oral Daily  . latanoprost  1 drop Left Eye q1800  . multivitamin with minerals  1 tablet Oral Daily  . pramipexole  1.5 mg Oral TID  . vancomycin  1,250 mg Intravenous Q24H   Continuous Infusions:   PRN Meds:.acetaminophen **OR** acetaminophen, albuterol, gi cocktail, ipratropium, LORazepam, ondansetron **OR** ondansetron (ZOFRAN) IV, oxyCODONE    PHYSICAL EXAM: Vital signs in last 24 hours: Filed Vitals:   04/29/15 2113 04/30/15 0175  04/30/15 0447 04/30/15 0500  BP: 119/70 157/93 154/76   Pulse: 67 58 57   Temp: 97.7 F (36.5 C) 97.7 F (36.5 C)    TempSrc: Oral Oral    Resp: 20 18    Height:      Weight:    73.1 kg (161 lb 2.5 oz)  SpO2: 93% 98%      Weight change: 0.388 kg (13.7 oz) Filed Weights   04/28/15 0545 04/29/15 0500 04/30/15 0500  Weight: 72.802 kg (160 lb 8  oz) 72.712 kg (160 lb 4.8 oz) 73.1 kg (161 lb 2.5 oz)   Body mass index is 27.65 kg/(m^2).   Gen Exam: Awake and alert with clear speech.  Resting tremors with significant dyskinesias Neck: Supple, No JVD.  Chest: B/L Clear.   CVS: S1 S2 Regular, no murmurs.  Abdomen: soft, BS +, non tender, non distended.  Extremities: no edema, lower extremities warm to touch. Neurologic: Non Focal-but with generalized weakness Skin: No Rash.   Wounds: N/A.   Intake/Output from previous day:  Intake/Output Summary (Last 24 hours) at 04/30/15 1135 Last data filed at 04/30/15 0946  Gross per 24 hour  Intake    520 ml  Output   1800 ml  Net  -1280 ml     LAB RESULTS: CBC  Recent Labs Lab 04/23/15 1743  04/23/15 2025 04/24/15 0609 04/26/15 0545 04/27/15 1410 04/28/15 0534  WBC 7.7  --  7.7 5.7 7.5 10.0 7.8  HGB 13.0  < > 12.5 12.2 13.9 14.0 13.0  HCT 39.3  < > 38.2 38.1 41.9 42.7 40.0  PLT 146*  --  128* 103* 127* 147* 135*  MCV 84.7  --  85.1 85.8 84.6 85.1 86.2  MCH 28.0  --  27.8 27.5 28.1 27.9 28.0  MCHC 33.1  --  32.7 32.0 33.2 32.8 32.5  RDW 16.3*  --  16.4* 16.6* 16.1* 16.5* 16.8*  LYMPHSABS 1.1  --   --   --   --  1.4  --   MONOABS 0.8  --   --   --   --  0.7  --   EOSABS 0.1  --   --   --   --  0.2  --   BASOSABS 0.1  --   --   --   --  0.1  --   < > = values in this interval not displayed.  Chemistries   Recent Labs Lab 04/23/15 1814 04/23/15 2025 04/24/15 0609 04/26/15 0545 04/27/15 1410 04/27/15 2112 04/28/15 0534  NA 141  --  137 137 141  --  140  K 4.1  --  4.1 4.1 4.3  --  3.8  CL 103  --  110 106 107  --  103  CO2  --   --  24 22 26   --  27  GLUCOSE 95  --  91 118* 100*  --  92  BUN 18  --  12 10 19   --  16  CREATININE 0.90 0.98 0.83 0.85 0.80  --  0.92  CALCIUM  --   --  8.3* 8.5* 9.2  --  8.8*  MG  --  1.9  --   --   --  2.1  --     CBG: No results for input(s): GLUCAP in the last 168 hours.  GFR Estimated Creatinine Clearance: 52.6 mL/min  (by C-G formula based on Cr of 0.92).  Coagulation profile No results for input(s): INR,  PROTIME in the last 168 hours.  Cardiac Enzymes No results for input(s): CKMB, TROPONINI, MYOGLOBIN in the last 168 hours.  Invalid input(s): CK  Invalid input(s): POCBNP No results for input(s): DDIMER in the last 72 hours. No results for input(s): HGBA1C in the last 72 hours. No results for input(s): CHOL, HDL, LDLCALC, TRIG, CHOLHDL, LDLDIRECT in the last 72 hours.  Recent Labs  04/27/15 2112  TSH 2.277   No results for input(s): VITAMINB12, FOLATE, FERRITIN, TIBC, IRON, RETICCTPCT in the last 72 hours. No results for input(s): LIPASE, AMYLASE in the last 72 hours.  Urine Studies No results for input(s): UHGB, CRYS in the last 72 hours.  Invalid input(s): UACOL, UAPR, USPG, UPH, UTP, UGL, UKET, UBIL, UNIT, UROB, ULEU, UEPI, UWBC, URBC, UBAC, CAST, UCOM, BILUA  MICROBIOLOGY: Recent Results (from the past 240 hour(s))  Culture, Urine     Status: None   Collection Time: 04/23/15  5:37 PM  Result Value Ref Range Status   Specimen Description URINE, RANDOM  Final   Special Requests ADDED [id number]  Final   Culture MULTIPLE SPECIES PRESENT, SUGGEST RECOLLECTION  Final   Report Status 04/25/2015 FINAL  Final  Urine culture     Status: None   Collection Time: 04/24/15  2:43 PM  Result Value Ref Range Status   Specimen Description URINE, CLEAN CATCH  Final   Special Requests NONE  Final   Culture MULTIPLE SPECIES PRESENT, SUGGEST RECOLLECTION  Final   Report Status 04/25/2015 FINAL  Final  Urine culture     Status: None   Collection Time: 04/27/15  2:16 PM  Result Value Ref Range Status   Specimen Description URINE, CLEAN CATCH  Final   Special Requests NONE  Final   Culture MULTIPLE SPECIES PRESENT, SUGGEST RECOLLECTION  Final   Report Status 04/29/2015 FINAL  Final    RADIOLOGY STUDIES/RESULTS: Dg Chest 2 View  04/27/2015   CLINICAL DATA:  Altered mental status/delirium  EXAM:  CHEST  2 VIEW  COMPARISON:  April 23, 2015 and November 07, 2012  FINDINGS: There is atelectasis in the left base. Lungs elsewhere clear. Heart size and pulmonary vascularity are normal. No adenopathy. Aorta is mildly prominent. There are stable compression fractures in the mid to lower thoracic spine region. 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  IMPRESSION: Left base atelectasis. No edema or consolidation. Mild aortic prominence is a stable finding that is likely indicative of chronic hypertension. There are multiple thoracic compression fractures with thoracolumbar levoscoliosis.   Electronically Signed   By: Lowella Grip III M.D.   On: 04/27/2015 14:00   Dg Pelvis Portable  04/23/2015   CLINICAL DATA:  Fall  EXAM: PORTABLE PELVIS 1-2 VIEWS  COMPARISON:  None.  FINDINGS: Prior right hip replacement. No acute bony abnormality. Specifically, no fracture, subluxation, or dislocation. Soft tissues are intact.  IMPRESSION: No acute bony abnormality.   Electronically Signed   By: Rolm Baptise M.D.   On: 04/23/2015 17:15   Dg Chest Portable 1 View  04/23/2015   CLINICAL DATA:  Evaluate pneumonia.  History of hypertension.  EXAM: PORTABLE CHEST - 1 VIEW  COMPARISON:  07/30/2013  FINDINGS: Low lung volumes. No confluent airspace opacities. Heart is borderline in size, accentuated by the low volumes and portable nature of the study. No effusions. No acute bony abnormality.  IMPRESSION: Low lung volumes.  No acute findings.   Electronically Signed   By: Rolm Baptise M.D.   On: 04/23/2015 17:15    SINGH,PRASHANT  Raliegh Ip, MD  [facility] Pager:[phone]  If 7PM-7AM, please contact night-coverage [email or web address] Password [email or web address] 04/30/2015, 11:35 AM   LOS: 3 days

## 2015-04-30 NOTE — Progress Notes (Signed)
ANTIBIOTIC CONSULT NOTE  Pharmacy Consult for Vancomycin Indication: cellulitis  Allergies  Allergen Reactions  . Codeine Other (See Comments)    Unknown allergic reaction  . Penicillins Other (See Comments)    Pt passed out in doctor's office after penicillin dose   Assessment: 74 year old female admitted with confusion currently on Rocephin and Vancomycin for UTI / cellulitis.  She has a history of Parkinson's disease, HTN, CHF, anxiety Cellulitis improved  Vancomycin 8/5> Rocephin 8/5>   Goal of Therapy:  Vancomycin trough level 10-15 mcg/ml  Plan:  Rocephin 1 gram iv Q 24 hours Vancomycin 1250 mg iv Q 24 hours Transition to po antibiotics?      Labs:  Recent Labs  04/27/15 1410 04/28/15 0534  WBC 10.0 7.8  HGB 14.0 13.0  PLT 147* 135*  CREATININE 0.80 0.92   Estimated Creatinine Clearance: 52.6 mL/min (by C-G formula based on Cr of 0.92). No results for input(s): VANCOTROUGH, VANCOPEAK, VANCORANDOM, GENTTROUGH, GENTPEAK, GENTRANDOM, TOBRATROUGH, TOBRAPEAK, TOBRARND, AMIKACINPEAK, AMIKACINTROU, AMIKACIN in the last 72 hours.   Microbiology: Recent Results (from the past 720 hour(s))  Culture, Urine     Status: None   Collection Time: 04/23/15  5:37 PM  Result Value Ref Range Status   Specimen Description URINE, RANDOM  Final   Special Requests ADDED [id number] [phone]  Final   Culture MULTIPLE SPECIES PRESENT, SUGGEST RECOLLECTION  Final   Report Status 04/25/2015 FINAL  Final  Urine culture     Status: None   Collection Time: 04/24/15  2:43 PM  Result Value Ref Range Status   Specimen Description URINE, CLEAN CATCH  Final   Special Requests NONE  Final   Culture MULTIPLE SPECIES PRESENT, SUGGEST RECOLLECTION  Final   Report Status 04/25/2015 FINAL  Final  Urine culture     Status: None   Collection Time: 04/27/15  2:16 PM  Result Value Ref Range Status   Specimen Description URINE, CLEAN CATCH  Final   Special Requests NONE  Final   Culture MULTIPLE  SPECIES PRESENT, SUGGEST RECOLLECTION  Final   Report Status 04/29/2015 FINAL  Final     Thank you Anette Guarneri, PharmD [phone]   04/30/2015,10:02 AM

## 2015-05-01 DIAGNOSIS — G9349 Other encephalopathy: Secondary | ICD-10-CM | POA: Diagnosis not present

## 2015-05-01 DIAGNOSIS — G20 Parkinson's disease: Secondary | ICD-10-CM | POA: Diagnosis not present

## 2015-05-01 DIAGNOSIS — G3184 Mild cognitive impairment, so stated: Secondary | ICD-10-CM | POA: Diagnosis not present

## 2015-05-01 DIAGNOSIS — E039 Hypothyroidism, unspecified: Secondary | ICD-10-CM | POA: Diagnosis not present

## 2015-05-01 DIAGNOSIS — R627 Adult failure to thrive: Secondary | ICD-10-CM | POA: Diagnosis not present

## 2015-05-01 DIAGNOSIS — J449 Chronic obstructive pulmonary disease, unspecified: Secondary | ICD-10-CM | POA: Diagnosis not present

## 2015-05-01 DIAGNOSIS — F419 Anxiety disorder, unspecified: Secondary | ICD-10-CM | POA: Diagnosis not present

## 2015-05-01 DIAGNOSIS — Z9181 History of falling: Secondary | ICD-10-CM | POA: Diagnosis not present

## 2015-05-01 DIAGNOSIS — R1319 Other dysphagia: Secondary | ICD-10-CM | POA: Diagnosis not present

## 2015-05-01 DIAGNOSIS — M6281 Muscle weakness (generalized): Secondary | ICD-10-CM | POA: Diagnosis not present

## 2015-05-01 DIAGNOSIS — R410 Disorientation, unspecified: Secondary | ICD-10-CM | POA: Diagnosis not present

## 2015-05-01 DIAGNOSIS — F333 Major depressive disorder, recurrent, severe with psychotic symptoms: Secondary | ICD-10-CM | POA: Diagnosis not present

## 2015-05-01 DIAGNOSIS — G9340 Encephalopathy, unspecified: Secondary | ICD-10-CM | POA: Diagnosis not present

## 2015-05-01 DIAGNOSIS — I10 Essential (primary) hypertension: Secondary | ICD-10-CM | POA: Diagnosis not present

## 2015-05-01 DIAGNOSIS — R41841 Cognitive communication deficit: Secondary | ICD-10-CM | POA: Diagnosis not present

## 2015-05-01 DIAGNOSIS — R278 Other lack of coordination: Secondary | ICD-10-CM | POA: Diagnosis not present

## 2015-05-01 DIAGNOSIS — R2689 Other abnormalities of gait and mobility: Secondary | ICD-10-CM | POA: Diagnosis not present

## 2015-05-01 MED ORDER — OXYCODONE HCL 5 MG PO TABS: 5.0000 mg | ORAL_TABLET | Freq: Every day | ORAL | Status: DC | PRN

## 2015-05-01 MED ORDER — CEFPODOXIME PROXETIL 200 MG PO TABS: 200.0000 mg | ORAL_TABLET | Freq: Two times a day (BID) | ORAL | Status: DC

## 2015-05-01 MED ORDER — LORAZEPAM 0.5 MG PO TABS: 0.5000 mg | ORAL_TABLET | Freq: Two times a day (BID) | ORAL | Status: DC | PRN

## 2015-05-01 NOTE — Discharge Summary (Signed)
[name], is a 74 y.o. female  DOB 12/22/1940  MRN [id number].  Admission date:  04/27/2015  Admitting Physician  Eugenie Filler, MD  Discharge Date:  05/01/2015   Primary MD  Hollace Kinnier, DO  Recommendations for primary care physician for things to follow:   Check CBC, BMP in a week. Kindly arrange for outpatient neurology follow-up within a week with her primary neurologist as mentioned below.   Admission Diagnosis  Acute delirium [R41.0]   Discharge Diagnosis  Acute delirium [R41.0]     Principal Problem:   Acute encephalopathy Active Problems:   Parkinson's disease   Essential hypertension   Diastolic dysfunction, grade 2   Dyskinesia   Debility   Acute delirium   UTI (urinary tract infection): Probable      Past Medical History  Diagnosis Date  . Parkinson's disease   . Hypertension   . Arthritis   . Anemia   . Cellulitis of lower leg 12/24/2011  . Chronic systolic heart failure   . Acute on chronic systolic heart failure   . Cellulitis and abscess of leg, except foot   . Hypotension, unspecified   . Helicobacter pylori (H. pylori)   . CHF (congestive heart failure)   . Anxiety   . Cataract left  . Glaucoma left  . GERD (gastroesophageal reflux disease)   . Hypercholesterolemia   . Chronic bronchitis     "usually get it q yr"  . Sleep apnea     "had test years ago; insurance wouldn't pay for mask so I never had one" (04/27/2015)  . Hypothyroidism   . History of blood transfusion     "don't remember why"  . History of bleeding peptic ulcer   . Stroke X 3    "that  was the reason I got Parkinson's"    Past Surgical History  Procedure Laterality Date  . Hemiarthroplasty hip Right 09/24/2007    Archie Endo 01/23/2011  . Thyroidectomy    . Esophagogastroduodenoscopy  09/26/2011   Procedure: ESOPHAGOGASTRODUODENOSCOPY (EGD);  Surgeon: Zenovia Jarred, MD;  Location: [facility] ENDOSCOPY;  Service: Gastroenterology;  Laterality: N/A;  To be done at bedside.  . Esophagogastroduodenoscopy N/A 09/30/2013    Procedure: ESOPHAGOGASTRODUODENOSCOPY (EGD);  Surgeon: [name], MD;  Location: [place];  Service: Endoscopy;  Laterality: N/A;  . Esophagogastroduodenoscopy N/A 11/25/2013    Procedure: ESOPHAGOGASTRODUODENOSCOPY (EGD);  Surgeon: [name], MD;  Location: [facility] ENDOSCOPY;  Service: Endoscopy;  Laterality: N/A;  . Tonsillectomy    . Nissen fundoplication      "had OR for GERD"  . Tendon repair      Gluteus medius [facility] Endo 01/23/2011  . Back surgery    . Kyphoplasty  06/2009    T12/notes 07/17/2009       HPI  from the history and physical done on the day of admission:    [name] is a 74 y.o. female  With history of Parkinson's disease, hypertension, chronic systolic heart failure, anxiety who was recently hospitalized from  04/23/2015 through 04/26/2015 for generalized weakness and falls with failure to thrive. During the hospitalization patient was seen by neurology and patient was started on amantadine in addition to a Sinemet for Parkinson's disease secondary to dyskinesias.Patient was discharged home with home health therapies. Patient presents back to the ED with confusion. History is obtained from both patient and per the ED physician's records.it is noted. The physician's record the patient was found sitting at edge of her bed by [employer] allegedly the talking to individuals who were not actually there. EMS was called to transport the patient to the hospital. It was noted that patient was having visual hallucinations. Patient however states that her brother had coming side on the floor at her house and was just staring in space. Patient states her brother brought to [place] within. And was asking for help. Patient stated that [employer] came by and the  brother wouldn't get up to open the dome and a such that when next oh and got a nephew who came by and called the police and let them in the door. The ED record EMS denied any Boys in the room when they got there. In the emergency room patient was also noted to become increasingly paranoid stating that people are in the window staring at her. Patient was also noted to state she was standing up while she was lying down. Patient also thought that she was making T4 the nurse. Urinalysis which was done was worrisome for UTI. BASIC metabolic profile obtained was unremarkable. CBC obtained had a white count of 10.0 platelet count 147 otherwise was within normal limits. Chest x-ray which was done was negative for any acute infiltrate. Patient was given a dose of IV Rocephin. [facility] were called to admit the patient for further evaluation and management.     Hospital Course:    Acute encephalopathy: Multifactorial for her to be from UTI/cellulitis and recent starting of amantadine. Continue antibiotics, agree with discontinuing amantadine. Encephalopathy has resolved, patient is awake and alert. [name] on 8/8.   Suspected UTI: Was treated with Rocephin and will transition to 2 more days of oral Vantin, inconclusive urine cultures. Encephalopathy has resolved improved.  Mild right lower extremity cellulitis: Briefly resolved after few days of vancomycin. No further anti-biotic for this problem.  Parkinson's disease with the dyskinesia: Chronic issue, continue Sinemet. Spoke with Dr. Janann Colonel on 8/5, who saw the patient a few days back,-dyskinesias are chronic and severe. He has no further recommendations apart from discontinuing amantadine given AMS. Will need outpatient follow-up with neurology with her primary neurologist Dr. Gaynell Face.  Essential hypertension: BP controlled, continue Lasix  Diastolic dysfunction, grade 2: Compensated, continue Lasix at home dose  Significant deconditioning: PT  eval completed- [name] on 8/8        Discharge Condition: Fair  Follow UP  Follow-up Information    Follow up with REED, [name], DO. Schedule an appointment as soon as possible for a visit in 1 week.   Specialty:  Geriatric Medicine   Why:  and your Neurologist within 1 week   Contact information:   [place]. [place] [id number] [phone]       Follow up with Star Age, MD. Schedule an appointment as soon as possible for a visit in 1 week.   Specialties:  Neurology, Radiology   Why:  Parkinson's disease   Contact information:   [address] [place] [address] [phone]  Consults obtained - none  Diet and Activity recommendation: See Discharge Instructions below  Discharge Instructions       Discharge Instructions    Discharge instructions    Complete by:  As directed   Follow with Primary MD REED, [name], DO in 7 days   Get CBC, CMP, 2 view Chest X ray checked  by Primary MD next visit.    Activity: As tolerated with Full fall precautions use walker/cane & assistance as needed   Disposition [name]   Diet: Heart Healthy  with feeding assistance and aspiration precautions.  For Heart failure patients - Check your Weight same time everyday, if you gain over 2 pounds, or you develop in leg swelling, experience more shortness of breath or chest pain, call your Primary MD immediately. Follow Cardiac Low Salt Diet and 1.5 lit/day fluid restriction.   On your next visit with your primary care physician please Get Medicines reviewed and adjusted.   Please request your Prim.MD to go over all Hospital Tests and Procedure/Radiological results at the follow up, please get all Hospital records sent to your Prim MD by signing hospital release before you go home.   If you experience worsening of your admission symptoms, develop shortness of breath, life threatening emergency, suicidal or homicidal thoughts you must seek medical  attention immediately by calling 911 or calling your MD immediately  if symptoms less severe.  You Must read complete instructions/literature along with all the possible adverse reactions/side effects for all the Medicines you take and that have been prescribed to you. Take any new Medicines after you have completely understood and accpet all the possible adverse reactions/side effects.   Do not drive, operating heavy machinery, perform activities at heights, swimming or participation in water activities or provide baby sitting services if your were admitted for syncope or siezures until you have seen by Primary MD or a Neurologist and advised to do so again.  Do not drive when taking Pain medications.    Do not take more than prescribed Pain, Sleep and Anxiety Medications  Special Instructions: If you have smoked or chewed Tobacco  in the last 2 yrs please stop smoking, stop any regular Alcohol  and or any Recreational drug use.  Wear Seat belts while driving.   Please note  You were cared for by a hospitalist during your hospital stay. If you have any questions about your discharge medications or the care you received while you were in the hospital after you are discharged, you can call the unit and asked to speak with the hospitalist on call if the hospitalist that took care of you is not available. Once you are discharged, your primary care physician will handle any further medical issues. Please note that NO REFILLS for any discharge medications will be authorized once you are discharged, as it is imperative that you return to your primary care physician (or establish a relationship with a primary care physician if you do not have one) for your aftercare needs so that they can reassess your need for medications and monitor your lab values.     Increase activity slowly    Complete by:  As directed              Discharge Medications       Medication List    STOP taking these  medications        amantadine 100 MG capsule  Commonly known as:  SYMMETREL      TAKE these medications  carbidopa-levodopa 25-100 MG per tablet  Commonly known as:  SINEMET IR  Take 1 tablet by mouth 6 (six) times daily. Take at 6a, 9a, 12n, 3p, 6pm, 9pm     cefpodoxime 200 MG tablet  Commonly known as:  VANTIN  Take 1 tablet (200 mg total) by mouth 2 (two) times daily. For 2 more days     docusate sodium 100 MG capsule  Commonly known as:  COLACE  Take 100 mg by mouth daily.     furosemide 40 MG tablet  Commonly known as:  LASIX  Take 40 mg by mouth daily.     Iron 325 (65 FE) MG Tabs  Take 1 capsule by mouth daily.     latanoprost 0.005 % ophthalmic solution  Commonly known as:  XALATAN  Place 1 drop into the left eye daily at 6 PM.     LORazepam 0.5 MG tablet  Commonly known as:  ATIVAN  Take 1 tablet (0.5 mg total) by mouth 2 (two) times daily as needed for anxiety.     multivitamin with minerals tablet  Take 1 tablet by mouth daily.     oxyCODONE 5 MG immediate release tablet  Commonly known as:  Oxy IR/ROXICODONE  Take 1 tablet (5 mg total) by mouth daily as needed for severe pain.     pramipexole 1.5 MG tablet  Commonly known as:  MIRAPEX  Take 1 tablet (1.5 mg total) by mouth 3 (three) times daily.     SYSTANE OP  Place 1 drop into the right eye daily at 6 PM.        Major procedures and Radiology Reports - PLEASE review detailed and final reports for all details, in brief -       Dg Chest 2 View  04/27/2015   CLINICAL DATA:  Altered mental status/delirium  EXAM: CHEST  2 VIEW  COMPARISON:  April 23, 2015 and November 07, 2012  FINDINGS: There is atelectasis in the left base. Lungs elsewhere clear. Heart size and pulmonary vascularity are normal. No adenopathy. Aorta is mildly prominent. There are stable compression fractures in the mid to lower thoracic spine region. 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  IMPRESSION: Left base atelectasis. No  edema or consolidation. Mild aortic prominence is a stable finding that is likely indicative of chronic hypertension. There are multiple thoracic compression fractures with thoracolumbar levoscoliosis.   Electronically Signed   By: Lowella Grip III M.D.   On: 04/27/2015 14:00   Dg Pelvis Portable  04/23/2015   CLINICAL DATA:  Fall  EXAM: PORTABLE PELVIS 1-2 VIEWS  COMPARISON:  None.  FINDINGS: Prior right hip replacement. No acute bony abnormality. Specifically, no fracture, subluxation, or dislocation. Soft tissues are intact.  IMPRESSION: No acute bony abnormality.   Electronically Signed   By: Rolm Baptise M.D.   On: 04/23/2015 17:15   Dg Chest Portable 1 View  04/23/2015   CLINICAL DATA:  Evaluate pneumonia.  History of hypertension.  EXAM: PORTABLE CHEST - 1 VIEW  COMPARISON:  07/30/2013  FINDINGS: Low lung volumes. No confluent airspace opacities. Heart is borderline in size, accentuated by the low volumes and portable nature of the study. No effusions. No acute bony abnormality.  IMPRESSION: Low lung volumes.  No acute findings.   Electronically Signed   By: Rolm Baptise M.D.   On: 04/23/2015 17:15    Micro Results      Recent Results (from the past 240 hour(s))  Culture, Urine  Status: None   Collection Time: 04/23/15  5:37 PM  Result Value Ref Range Status   Specimen Description URINE, RANDOM  Final   Special Requests ADDED [id number] [phone]  Final   Culture MULTIPLE SPECIES PRESENT, SUGGEST RECOLLECTION  Final   Report Status 04/25/2015 FINAL  Final  Urine culture     Status: None   Collection Time: 04/24/15  2:43 PM  Result Value Ref Range Status   Specimen Description URINE, CLEAN CATCH  Final   Special Requests NONE  Final   Culture MULTIPLE SPECIES PRESENT, SUGGEST RECOLLECTION  Final   Report Status 04/25/2015 FINAL  Final  Urine culture     Status: None   Collection Time: 04/27/15  2:16 PM  Result Value Ref Range Status   Specimen Description URINE, CLEAN CATCH  Final    Special Requests NONE  Final   Culture MULTIPLE SPECIES PRESENT, SUGGEST RECOLLECTION  Final   Report Status 04/29/2015 FINAL  Final       Today   Subjective    [name] today has no headache,no chest abdominal pain,no new weakness tingling or numbness, feels much better .   Objective   Blood pressure 150/77, pulse 59, temperature 97.7 F (36.5 C), temperature source Oral, resp. rate 18, height 5\' 4"  (1.626 m), weight 73.1 kg (161 lb 2.5 oz), SpO2 96 %.   Intake/Output Summary (Last 24 hours) at 05/01/15 1040 Last data filed at 05/01/15 0830  Gross per 24 hour  Intake    300 ml  Output   1640 ml  Net  -1340 ml    Exam Awake Alert, Oriented x 3, No new F.N deficits, Normal affect, some generalized resting tremor is noted [place].AT,PERRAL Supple Neck,No JVD, No cervical lymphadenopathy appriciated.  Symmetrical Chest wall movement, Good air movement bilaterally, CTAB RRR,No Gallops,Rubs or new Murmurs, No Parasternal Heave +ve B.Sounds, Abd Soft, Non tender, No organomegaly appriciated, No rebound -guarding or rigidity. No Cyanosis, Clubbing or edema, No new Rash or bruise   Data Review   CBC w Diff: Lab Results  Component Value Date   WBC 7.8 04/28/2015   WBC 7.4 04/03/2015   HGB 13.0 04/28/2015   HCT 40.0 04/28/2015   HCT 38.2 04/03/2015   PLT 135* 04/28/2015   LYMPHOPCT 14 04/27/2015   BANDSPCT 0 12/07/2014   MONOPCT 7 04/27/2015   EOSPCT 2 04/27/2015   BASOPCT 1 04/27/2015    CMP: Lab Results  Component Value Date   NA 140 04/28/2015   NA 143 04/03/2015   K 3.8 04/28/2015   CL 103 04/28/2015   CO2 27 04/28/2015   BUN 16 04/28/2015   BUN 18 04/03/2015   CREATININE 0.92 04/28/2015   PROT 6.2* 04/27/2015   PROT 5.8* 04/03/2015   ALBUMIN 3.4* 04/27/2015   BILITOT 0.8 04/27/2015   BILITOT 0.4 04/03/2015   ALKPHOS 67 04/27/2015   AST 24 04/27/2015   ALT 24 04/27/2015  .   Total Time in preparing paper work, data evaluation and todays exam - 35  minutes  Thurnell Lose M.D on 05/01/2015 at 10:40 AM  [facility]   Office  [phone]

## 2015-05-01 NOTE — Discharge Instructions (Signed)
Follow with Primary MD REED, [name], DO in 7 days   Get CBC, CMP, 2 view Chest X ray checked  by Primary MD next visit.    Activity: As tolerated with Full fall precautions use walker/cane & assistance as needed   Disposition [name]   Diet: Heart Healthy  with feeding assistance and aspiration precautions.  For Heart failure patients - Check your Weight same time everyday, if you gain over 2 pounds, or you develop in leg swelling, experience more shortness of breath or chest pain, call your Primary MD immediately. Follow Cardiac Low Salt Diet and 1.5 lit/day fluid restriction.   On your next visit with your primary care physician please Get Medicines reviewed and adjusted.   Please request your Prim.MD to go over all Hospital Tests and Procedure/Radiological results at the follow up, please get all Hospital records sent to your Prim MD by signing hospital release before you go home.   If you experience worsening of your admission symptoms, develop shortness of breath, life threatening emergency, suicidal or homicidal thoughts you must seek medical attention immediately by calling 911 or calling your MD immediately  if symptoms less severe.  You Must read complete instructions/literature along with all the possible adverse reactions/side effects for all the Medicines you take and that have been prescribed to you. Take any new Medicines after you have completely understood and accpet all the possible adverse reactions/side effects.   Do not drive, operating heavy machinery, perform activities at heights, swimming or participation in water activities or provide baby sitting services if your were admitted for syncope or siezures until you have seen by Primary MD or a Neurologist and advised to do so again.  Do not drive when taking Pain medications.    Do not take more than prescribed Pain, Sleep and Anxiety Medications  Special Instructions: If you have smoked or chewed Tobacco  in the  last 2 yrs please stop smoking, stop any regular Alcohol  and or any Recreational drug use.  Wear Seat belts while driving.   Please note  You were cared for by a hospitalist during your hospital stay. If you have any questions about your discharge medications or the care you received while you were in the hospital after you are discharged, you can call the unit and asked to speak with the hospitalist on call if the hospitalist that took care of you is not available. Once you are discharged, your primary care physician will handle any further medical issues. Please note that NO REFILLS for any discharge medications will be authorized once you are discharged, as it is imperative that you return to your primary care physician (or establish a relationship with a primary care physician if you do not have one) for your aftercare needs so that they can reassess your need for medications and monitor your lab values.

## 2015-05-01 NOTE — Consult Note (Signed)
   [facility] CM Inpatient Consult   05/01/2015  [name] 01-14-1941 [id number] Referral received to assess for care management services. Explained that [place] [facility] is a covered benefit of Medicare insurance.   Met with the patient regarding the benefits of [place] Management services. Patient confirmed her primary care provider to be Dr. Hollace Kinnier. Reviewed information for [facility] Care Management and a folder was provided with contact information.  Explained that [place] Management does not interfere with or replace any services arranged by the inpatient care management staff.  Patient did not consent services with [facility] Care Management. Patient requested brochure and contact information and given.  For questions, please contact: Natividad Brood, RN BSN [facility] Liaison  [phone] business mobile phone

## 2015-05-01 NOTE — Progress Notes (Signed)
Speech Language Pathology Treatment: Dysphagia  Patient Details Name: [name] MRN: [id number] DOB: May 14, 1941 Today's Date: 05/01/2015 Time: 1050-1100 SLP Time Calculation (min) (ACUTE ONLY): 10 min  Assessment / Plan / Recommendation Clinical Impression  Dysphagia treatment provided today for diet tolerance/ compensatory strategy training and education. Pt showed no overt s/s of aspiration with dysphagia 3/ thin liquid consistencies. Reports continuing to have occasional globus sensation. Reviewed compensatory strategies- small sips/ bites, small meals at a time, alternate food and liquid, sit upright 30 minutes after meal. Recommend continuing dysphagia 3 diet / thin liquids as pt continues to have prolonged mastication/ bolus formation. Will sign off at this time as aspiration risk appears mild and pt has verbalized understanding of strategies. Please re-consult if needs arise.   HPI Other Pertinent Information: History of Parkinson's disease, hypertension, chronic systolic heart failure, anxiety who was recently hospitalized from 04/23/2015 through 04/26/2015 for generalized weakness and falls with failure to thrive. Patient was discharged home with home health therapies. Patient presents back to the ED on 04/27/15 with confusion. Chest x-ray was negative for any acute infiltrate.  Orders received for bedside swallow evaluation.    Pertinent Vitals Pain Assessment: No/denies pain  SLP Plan  All goals met    Recommendations Diet recommendations: Dysphagia 3 (mechanical soft);Thin liquid Liquids provided via: Cup;Straw Medication Administration: Whole meds with liquid Supervision: Patient able to self feed;Intermittent supervision to cue for compensatory strategies Compensations: Slow rate;Small sips/bites;Follow solids with liquid Postural Changes and/or Swallow Maneuvers: Seated upright 90 degrees;Upright 30-60 min after meal              Oral Care Recommendations: Oral care  BID Follow up Recommendations: None Plan: All goals met    GO     Kern Reap, MA, CCC-SLP 05/01/2015, 11:04 AM [phone]

## 2015-05-01 NOTE — Clinical Social Work Placement (Signed)
   CLINICAL SOCIAL WORK PLACEMENT  NOTE  Date:  05/01/2015  Patient Details  Name: [name] MRN: [id number] Date of Birth: 12-15-1940  Clinical Social Work is seeking post-discharge placement for this patient at the [place] level of care (*CSW will initial, date and re-position this form in  chart as items are completed):  Yes   Patient/family provided with [facility] Work Department's list of facilities offering this level of care within the geographic area requested by the patient (or if unable, by the patient's family).  Yes   Patient/family informed of their freedom to choose among providers that offer the needed level of care, that participate in Medicare, Medicaid or managed care program needed by the patient, have an available bed and are willing to accept the patient.  Yes   Patient/family informed of [facility]'s ownership interest in [facility] and [facility], as well as of the fact that they are under no obligation to receive care at these facilities.  PASRR submitted to EDS on 04/28/15     PASRR number received on 04/28/15     Existing PASRR number confirmed on       FL2 transmitted to all facilities in geographic area requested by pt/family on 04/28/15     FL2 transmitted to all facilities within larger geographic area on       Patient informed that his/her managed care company has contracts with or will negotiate with certain facilities, including the following:        Yes   Patient/family informed of bed offers received.  Patient chooses bed at [facility] [facility]     Physician recommends and patient chooses bed at      Patient to be transferred to [facility] ([place] ) on 05/01/15.  Patient to be transferred to facility by Ambulance     Patient family notified on 05/01/15 of transfer.  Name of family member notified:  Patient notifying family     PHYSICIAN Please sign FL2     Additional Comment:   Per MD patient ready for DC to [place], patient, patient's family, and facility notified of DC. RN given number for report. DC packet on chart. Ambulance transport requested for patient. CSW signing off.   _______________________________________________ Liz Beach MSW, [place], [place], [phone]

## 2015-05-01 NOTE — Progress Notes (Signed)
Pt prepared for d/c to [name]. IV d/c'd. Skin intact except as most recently charted. Vitals are stable. Report called to receiving facility. Pt to be transported by ambulance service. 

## 2015-05-01 NOTE — Care Management Important Message (Signed)
Important Message  Patient Details  Name: [name] MRN: [id number] Date of Birth: Jan 24, 1941   Medicare Important Message Given:  Yes-second notification given    Pricilla Handler 05/01/2015, 3:09 PM

## 2015-05-03 DIAGNOSIS — I10 Essential (primary) hypertension: Secondary | ICD-10-CM | POA: Diagnosis not present

## 2015-05-03 DIAGNOSIS — G9340 Encephalopathy, unspecified: Secondary | ICD-10-CM | POA: Diagnosis not present

## 2015-05-03 DIAGNOSIS — G20 Parkinson's disease: Secondary | ICD-10-CM | POA: Diagnosis not present

## 2015-05-08 ENCOUNTER — Ambulatory Visit: Payer: Medicare Other | Admitting: Internal Medicine

## 2015-05-08 ENCOUNTER — Encounter: Payer: Self-pay | Admitting: Internal Medicine

## 2015-05-16 NOTE — Progress Notes (Signed)
   04/24/15 1430  OT G-codes **NOT FOR INPATIENT CLASS**  Functional Assessment Tool Used clinical observation and chart review  Functional Limitation Self care  Self Care Current Status (V8592) CJ  Self Care Goal Status (T2446) CI   Late entry for 04/24/15

## 2015-05-17 NOTE — Progress Notes (Signed)
PT Note Late G Code Entry    04/24/15 1143  PT G-Codes **NOT FOR INPATIENT CLASS**  Functional Assessment Tool Used clinical judgement  Functional Limitation Mobility: Walking and moving around  Mobility: Walking and Moving Around Current Status (T5521) CJ  Mobility: Walking and Moving Around Goal Status [phone]) CI  [facility] PT [phone]

## 2015-05-31 ENCOUNTER — Ambulatory Visit: Payer: Medicare Other | Admitting: Neurology

## 2015-06-14 DIAGNOSIS — J449 Chronic obstructive pulmonary disease, unspecified: Secondary | ICD-10-CM | POA: Diagnosis not present

## 2015-06-14 DIAGNOSIS — G20 Parkinson's disease: Secondary | ICD-10-CM | POA: Diagnosis not present

## 2015-06-14 DIAGNOSIS — I10 Essential (primary) hypertension: Secondary | ICD-10-CM | POA: Diagnosis not present

## 2015-06-14 DIAGNOSIS — E039 Hypothyroidism, unspecified: Secondary | ICD-10-CM | POA: Diagnosis not present

## 2015-07-03 DIAGNOSIS — Z79891 Long term (current) use of opiate analgesic: Secondary | ICD-10-CM | POA: Diagnosis not present

## 2015-07-03 DIAGNOSIS — G894 Chronic pain syndrome: Secondary | ICD-10-CM | POA: Diagnosis not present

## 2015-07-03 DIAGNOSIS — M41125 Adolescent idiopathic scoliosis, thoracolumbar region: Secondary | ICD-10-CM | POA: Diagnosis not present

## 2015-07-03 DIAGNOSIS — M5136 Other intervertebral disc degeneration, lumbar region: Secondary | ICD-10-CM | POA: Diagnosis not present

## 2015-07-06 ENCOUNTER — Encounter: Payer: Self-pay | Admitting: Internal Medicine

## 2015-07-06 ENCOUNTER — Telehealth: Payer: Self-pay

## 2015-07-06 ENCOUNTER — Ambulatory Visit (INDEPENDENT_AMBULATORY_CARE_PROVIDER_SITE_OTHER): Payer: Medicare Other | Admitting: Internal Medicine

## 2015-07-06 VITALS — BP 122/82 | HR 81 | Temp 98.1°F | Resp 20 | Ht 64.0 in

## 2015-07-06 DIAGNOSIS — K5900 Constipation, unspecified: Secondary | ICD-10-CM

## 2015-07-06 DIAGNOSIS — I5042 Chronic combined systolic (congestive) and diastolic (congestive) heart failure: Secondary | ICD-10-CM | POA: Diagnosis not present

## 2015-07-06 DIAGNOSIS — G20 Parkinson's disease: Secondary | ICD-10-CM

## 2015-07-06 DIAGNOSIS — Z23 Encounter for immunization: Secondary | ICD-10-CM

## 2015-07-06 DIAGNOSIS — D509 Iron deficiency anemia, unspecified: Secondary | ICD-10-CM

## 2015-07-06 DIAGNOSIS — M545 Low back pain, unspecified: Secondary | ICD-10-CM

## 2015-07-06 DIAGNOSIS — H409 Unspecified glaucoma: Secondary | ICD-10-CM

## 2015-07-06 DIAGNOSIS — K5909 Other constipation: Secondary | ICD-10-CM

## 2015-07-06 DIAGNOSIS — G20A1 Parkinson's disease without dyskinesia, without mention of fluctuations: Secondary | ICD-10-CM

## 2015-07-06 MED ORDER — VITAMIN D 50 MCG (2000 UT) PO CAPS: 1.0000 | ORAL_CAPSULE | Freq: Every day | ORAL | Status: DC

## 2015-07-06 MED ORDER — LORAZEPAM 0.5 MG PO TABS: 0.5000 mg | ORAL_TABLET | Freq: Two times a day (BID) | ORAL | Status: DC | PRN

## 2015-07-06 NOTE — Telephone Encounter (Signed)
Per patient's request I called [facility] to see if an order was received from patient's Orthopedic Specialist. Per receptionist at [facility] no orders received.   I called patient and left message informing her to follow-up with Ortho.  Dr.Reed was verbally informed

## 2015-07-06 NOTE — Progress Notes (Signed)
Patient ID: [name], female   DOB: 01/25/1941, 74 y.o.   MRN: [id number]   Location: [place] Provider: Rexene Edison. Mariea Clonts, D.O., C.M.D.  Code Status: DNR Goals of Care: Advanced Directive information Does patient have an advance directive?: Yes, Pre-existing out of facility DNR order (yellow form or pink MOST form): Pink MOST form placed in chart (order not valid for inpatient use)  Chief Complaint  Patient presents with  . Medical Management of Chronic Issues    3 month follow-up for Hypertension, Parkinson  . Immunizations    Discuss flu shot    HPI: Patient is a 74 y.o. female seen in the office today for medical mgt of chronic diseases with HTN, Parkinson's, h/o GI bleed, chronic systolic chf, chronic back pain and constipation and chronic venous insufficiency.  Is supposed to be getting hospital bed as ordered by orthopedics--has trouble getting on and off her bed.  Needs it to go up and down.  He also prescribed PT for her and oxycodone for pain.   On a Sunday afternoon, she fell twice.  She went to the hospital.  They checked her out and she went home, then she was sent back to the hospital for a few more days.  Was sent to rehab.  Has been in rehab at [facility] for 2 mos.  Had cellulitis and a UTI.  Says she eats small meals at a time.  Now has caregiver living with her.  She cleaned up the place for her before she moved in.    Sees Dr. Shirley Muscat Tuesday.  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  Right eye is dry.    Review of Systems:  Review of Systems  Constitutional: Negative for fever, chills and malaise/fatigue.  HENT: Negative for congestion and hearing loss.   Eyes: Negative for blurred vision.  Respiratory: Negative for cough and shortness of breath.   Cardiovascular: Negative for chest pain and leg swelling.  Gastrointestinal: Positive for constipation. Negative for heartburn, abdominal pain, blood in stool and melena.  Genitourinary: Negative for  dysuria.  Musculoskeletal: Positive for back pain. Negative for myalgias and falls.       Freezing persists at times (was reason for hospitalizations--could not move her legs)  Skin: Negative for rash.  Neurological: Positive for tremors. Negative for dizziness, loss of consciousness, weakness and headaches.       Dyskinesias, tremor present  Psychiatric/Behavioral: Negative for depression and memory loss.    Past Medical History  Diagnosis Date  . Parkinson's disease   . Hypertension   . Arthritis   . Anemia   . Cellulitis of lower leg 12/24/2011  . Chronic systolic heart failure ([place])   . Acute on chronic systolic heart failure ([place])   . Cellulitis and abscess of leg, except foot   . Hypotension, unspecified   . Helicobacter pylori (H. pylori)   . CHF (congestive heart failure) ([place])   . Anxiety   . Cataract left  . Glaucoma left  . GERD (gastroesophageal reflux disease)   . Hypercholesterolemia   . Chronic bronchitis ([place])     "usually get it q yr"  . Sleep apnea     "had test years ago; insurance wouldn't pay for mask so I never had one" (04/27/2015)  . Hypothyroidism   . History of blood transfusion     "don't remember why"  . History of bleeding peptic ulcer   . Stroke [facility]) X 3    "that  was the reason I got Parkinson's"    Past Surgical History  Procedure Laterality Date  . Hemiarthroplasty hip Right 09/24/2007    Archie Endo 01/23/2011  . Thyroidectomy    . Esophagogastroduodenoscopy  09/26/2011    Procedure: ESOPHAGOGASTRODUODENOSCOPY (EGD);  Surgeon: Zenovia Jarred, MD;  Location: [facility] ENDOSCOPY;  Service: Gastroenterology;  Laterality: N/A;  To be done at bedside.  . Esophagogastroduodenoscopy N/A 09/30/2013    Procedure: ESOPHAGOGASTRODUODENOSCOPY (EGD);  Surgeon: [name], MD;  Location: [place];  Service: Endoscopy;  Laterality: N/A;  . Esophagogastroduodenoscopy N/A 11/25/2013    Procedure: ESOPHAGOGASTRODUODENOSCOPY (EGD);  Surgeon: [name], MD;  Location:  [facility] ENDOSCOPY;  Service: Endoscopy;  Laterality: N/A;  . Tonsillectomy    . Nissen fundoplication      "had OR for GERD"  . Tendon repair      Gluteus medius [facility] 01/23/2011  . Back surgery    . Kyphoplasty  06/2009    T12/notes 07/17/2009    Allergies  Allergen Reactions  . Codeine Other (See Comments)    Unknown allergic reaction  . Penicillins Other (See Comments)    Pt passed out in doctor's office after penicillin dose      Medication List       This list is accurate as of: 07/06/15  1:22 PM.  Always use your most recent med list.               carbidopa-levodopa 25-100 MG tablet  Commonly known as:  SINEMET IR  Take 1 tablet by mouth 6 (six) times daily. Take at 6a, 9a, 12n, 3p, 6pm, 9pm     cefpodoxime 200 MG tablet  Commonly known as:  VANTIN  Take 1 tablet (200 mg total) by mouth 2 (two) times daily. For 2 more days     docusate sodium 100 MG capsule  Commonly known as:  COLACE  Take 100 mg by mouth daily.     furosemide 40 MG tablet  Commonly known as:  LASIX  Take 40 mg by mouth daily.     Iron 325 (65 FE) MG Tabs  Take 1 capsule by mouth daily.     latanoprost 0.005 % ophthalmic solution  Commonly known as:  XALATAN  Place 1 drop into the left eye daily at 6 PM.     LORazepam 0.5 MG tablet  Commonly known as:  ATIVAN  Take 1 tablet (0.5 mg total) by mouth 2 (two) times daily as needed for anxiety.     multivitamin with minerals tablet  Take 1 tablet by mouth daily.     oxyCODONE 5 MG immediate release tablet  Commonly known as:  Oxy IR/ROXICODONE  Take 1 tablet (5 mg total) by mouth daily as needed for severe pain.     pramipexole 1.5 MG tablet  Commonly known as:  MIRAPEX  Take 1 tablet (1.5 mg total) by mouth 3 (three) times daily.     SYSTANE OP  Place 1 drop into the right eye daily at 6 PM.        Health Maintenance  Topic Date Due  . TETANUS/TDAP  06/07/1959  . COLONOSCOPY  06/06/1990  . ZOSTAVAX  06/06/2000  . DEXA SCAN   06/06/2005  . INFLUENZA VACCINE  04/24/2015  . PNA vac Low Risk Adult  Completed    Physical Exam: Filed Vitals:   07/06/15 1307  BP: 122/82  Pulse: 81  Temp: 98.1 F (36.7 C)  TempSrc: Oral  Resp: 20  Height: 5'  4" (1.626 m)  SpO2: 97%   There is no weight on file to calculate BMI. Physical Exam  Constitutional: She is oriented to person, place, and time. No distress.  Cardiovascular: Normal rate, regular rhythm, normal heart sounds and intact distal pulses.   Pulmonary/Chest: Effort normal and breath sounds normal. No respiratory distress.  Abdominal: Soft. Bowel sounds are normal. She exhibits no distension and no mass. There is no tenderness.  Musculoskeletal:  Chronic dyskinesias--more severe today  Neurological: She is alert and oriented to person, place, and time.  Skin: Skin is warm and dry.  Psychiatric: She has a normal mood and affect.  Seems happier now that she has a caregiver with her    Labs reviewed: Basic Metabolic Panel:  Recent Labs  04/23/15 2025  04/26/15 0545 04/27/15 1410 04/27/15 2112 04/28/15 0534  NA  --   < > 137 141  --  140  K  --   < > 4.1 4.3  --  3.8  CL  --   < > 106 107  --  103  CO2  --   < > 22 26  --  27  GLUCOSE  --   < > 118* 100*  --  92  BUN  --   < > 10 19  --  16  CREATININE 0.98  < > 0.85 0.80  --  0.92  CALCIUM  --   < > 8.5* 9.2  --  8.8*  MG 1.9  --   --   --  2.1  --   PHOS 3.7  --   --   --   --   --   TSH  --   --   --   --  2.277  --   < > = values in this interval not displayed. Liver Function Tests:  Recent Labs  04/03/15 1350 04/24/15 0609 04/27/15 1410  AST 18 30 24   ALT 7 6* 24  ALKPHOS 66 52 67  BILITOT 0.4 1.0 0.8  PROT 5.8* 5.3* 6.2*  ALBUMIN 3.8 2.9* 3.4*   No results for input(s): LIPASE, AMYLASE in the last 8760 hours. No results for input(s): AMMONIA in the last 8760 hours. CBC:  Recent Labs  04/03/15 1350 04/23/15 1743  04/26/15 0545 04/27/15 1410 04/28/15 0534  WBC 7.4 7.7  <  > 7.5 10.0 7.8  NEUTROABS 5.6 5.6  --   --  7.6  --   HGB  --  13.0  < > 13.9 14.0 13.0  HCT 38.2 39.3  < > 41.9 42.7 40.0  MCV  --  84.7  < > 84.6 85.1 86.2  PLT  --  146*  < > 127* 147* 135*  < > = values in this interval not displayed. Lipid Panel: No results for input(s): CHOL, HDL, LDLCALC, TRIG, CHOLHDL, LDLDIRECT in the last 8760 hours. No results found for: HGBA1C  Procedures since last visit: CXR 04/23/15:  Low lung volumes, no acute findings. DG pelvis portable 04/23/15:  No acute bony abnormality CXR 04/27/15:  Left base atelectasis. No edema or consolidation. Mild aortic prominence is a stable finding that is likely indicative of chronic hypertension. There are multiple thoracic compression fractures with thoracolumbar levoscoliosis. Assessment/Plan 1. Parkinson's disease ([place]) - continues with severe dyskinesias, tremor, occasional freezing -cont sinemet and mirapex as per neurology -I am thrilled that she finally has a live-in caregiver to help her and it seems she is doing better already from this -I've  been encouraging her to get more help for a long time -also is now coming in a wheelchair as her PD has progressed and she has more difficulty walking - LORazepam (ATIVAN) 0.5 MG tablet; Take 1 tablet (0.5 mg total) by mouth 2 (two) times daily as needed for anxiety.  Dispense: 30 tablet; Refill: 0  2. Chronic combined systolic and diastolic congestive heart failure (HCC) -has been stable without exacerbation -cont to monitor and maintain lasix 40mg  daily, f/u bmp - Basic metabolic panel  3. Midline low back pain without sciatica -biggest complaint today -she is fearful of the oxycodone, but it does relieve her pain -educated on taking it to help pain--she is afraid of addiction/getting hooked or having a side effect after reading the package insert--she's been prescribed this before and has always been hesitant, but she's suffering without it and does not respond  adequately to weaker medications in the past -advised to especially take one when she's trying to be more active  4. Chronic constipation -counseled on use of colace, but adding miralax in the event that her constipation increases as she takes the oxycodone--her caregiver seemed to understand  5. Anemia, iron deficiency -h/o GI bleed with ulcer in past -f/u labs and continue iron - CBC with Differential/Platelet - Basic metabolic panel  6. Glaucoma -doing well with xalatan drops, keep ophtho f/u as planned  7. Need for influenza vaccination - Flu Vaccine QUAD 36+ mos IM (Fluarix & Fluzone Quad PF was given  Labs/tests ordered:   Orders Placed This Encounter  Procedures  . Flu Vaccine QUAD 36+ mos IM (Fluarix & Fluzone Quad PF  . CBC with Differential/Platelet  . Basic metabolic panel    Next appt:  10/06/2015   Ladawna Walgren L. Ryer Asato, D.O. [facility] [address], [place] [address] Cell Phone (Mon-Fri 8am-5pm):  [phone] On Call:  [phone] & follow prompts after 5pm & weekends Office Phone:  [phone] Office Fax:  [phone]

## 2015-07-07 LAB — CBC WITH DIFFERENTIAL/PLATELET
Basophils Absolute: 0.1 10*3/uL (ref 0.0–0.2)
Basos: 1 %
EOS (ABSOLUTE): 0.3 10*3/uL (ref 0.0–0.4)
Eos: 4 %
Hematocrit: 36.8 % (ref 34.0–46.6)
Hemoglobin: 12.1 g/dL (ref 11.1–15.9)
Immature Grans (Abs): 0 10*3/uL (ref 0.0–0.1)
Immature Granulocytes: 1 %
Lymphocytes Absolute: 1.2 10*3/uL (ref 0.7–3.1)
Lymphs: 15 %
MCH: 27.6 pg (ref 26.6–33.0)
MCHC: 32.9 g/dL (ref 31.5–35.7)
MCV: 84 fL (ref 79–97)
Monocytes Absolute: 0.6 10*3/uL (ref 0.1–0.9)
Monocytes: 7 %
Neutrophils Absolute: 5.8 10*3/uL (ref 1.4–7.0)
Neutrophils: 72 %
Platelets: 187 10*3/uL (ref 150–379)
RBC: 4.39 x10E6/uL (ref 3.77–5.28)
RDW: 15.4 % (ref 12.3–15.4)
WBC: 7.9 10*3/uL (ref 3.4–10.8)

## 2015-07-07 LAB — BASIC METABOLIC PANEL
BUN/Creatinine Ratio: 26 (ref 11–26)
BUN: 18 mg/dL (ref 8–27)
CO2: 22 mmol/L (ref 18–29)
Calcium: 9 mg/dL (ref 8.7–10.3)
Chloride: 106 mmol/L (ref 97–108)
Creatinine, Ser: 0.69 mg/dL (ref 0.57–1.00)
GFR calc Af Amer: 98 mL/min/{1.73_m2} (ref >59)
GFR calc non Af Amer: 85 mL/min/{1.73_m2} (ref >59)
Glucose: 105 mg/dL — ABNORMAL HIGH (ref 65–99)
Potassium: 4.2 mmol/L (ref 3.5–5.2)
Sodium: 142 mmol/L (ref 134–144)

## 2015-07-10 ENCOUNTER — Ambulatory Visit (INDEPENDENT_AMBULATORY_CARE_PROVIDER_SITE_OTHER): Payer: Medicare Other | Admitting: Neurology

## 2015-07-10 ENCOUNTER — Ambulatory Visit: Payer: Medicare Other | Admitting: Neurology

## 2015-07-10 ENCOUNTER — Encounter: Payer: Self-pay | Admitting: Neurology

## 2015-07-10 VITALS — BP 98/62 | HR 66 | Resp 20 | Ht 64.0 in | Wt 160.0 lb

## 2015-07-10 DIAGNOSIS — Z9181 History of falling: Secondary | ICD-10-CM

## 2015-07-10 DIAGNOSIS — G249 Dystonia, unspecified: Secondary | ICD-10-CM | POA: Diagnosis not present

## 2015-07-10 DIAGNOSIS — R269 Unspecified abnormalities of gait and mobility: Secondary | ICD-10-CM

## 2015-07-10 DIAGNOSIS — G20 Parkinson's disease: Secondary | ICD-10-CM

## 2015-07-10 MED ORDER — PRAMIPEXOLE DIHYDROCHLORIDE 1.5 MG PO TABS: 1.5000 mg | ORAL_TABLET | Freq: Three times a day (TID) | ORAL | Status: DC

## 2015-07-10 MED ORDER — CARBIDOPA-LEVODOPA 25-100 MG PO TABS: 1.0000 | ORAL_TABLET | Freq: Every day | ORAL | Status: DC

## 2015-07-10 NOTE — Progress Notes (Signed)
Subjective:    Patient ID: [name] is a 74 y.o. female.  HPI     Interim history:   Ms. Tarlton is a 74 year old right-handed woman with an underlying complex medical history of hypertension, arthritis, anemia, cellulitis, chronic systolic heart failure, peptic ulcer disease, and chronic low back pain with history of T12 compression fracture, status post kyphoplasty in October 2010, who presents for follow-up consultation of her Parkinson's disease, complicated by motor complications, motor fluctuations and recurrent falls. The patient is accompanied by Jackelyn Poling, friend and now live-in caretaker today. I last saw her on 01/16/2015 for sooner than scheduled appointment due to more freezing reported. She reported taking Ativan 0.5 mg as needed for anxiety but not every day. She was taking Sinemet 1 pill 6 times a day. She was on Mirapex 1.5 mg 3 times a day. She was encouraged to continue these medications. In addition, I suggested as needed Parcopa 25-100 milligrams strength half a pill up to twice daily to help with freezing. In addition, she was encouraged to start gabapentin slowly which was prescribed by another physician for leg pain. She was in [facility] at this time and enjoying it. Her cellulitis was improving, on Abx. Her freezing episodes were happening in the late morning and late afternoons. She reported no recent falls. In the interim, the patient called on 02/06/2015 reporting that the medication changes were not helpful. She was advised to stop the Parcopa and instead increase her Sinemet by alternating 1 pill with 1-1/2 pills for a total of 6 doses a day. Of note, she no showed for an appointment on 03/07/2015.  In the interim, she was seen by Cecille Rubin, NP, on 03/14/2015, at which time, her medication including Sinemet and Mirapex were kept the same.  In the interim, she was hospitalized twice, on 04/23/2015 through 04/26/2015 for global weakness and failure to thrive, as well  as worsening Parkinson symptoms. Amantadine was added to her regimen. She was discharged with home health therapies as she could not afford skilled nursing for rehabilitation. She was readmitted on 04/27/2015 for altered mental status in the context of UTI and recently started amantadine. She was discharged on 05/01/2015. She was treated for UTI and amantadine was discontinued. She was transferred to skilled nursing facility.  Today, 07/10/2015: She reports she feels okay. Has been back home for about 10 days from [name]. [name] moved him with her to help. Patient has not fallen since her [name] stay. She is getting HH therapies. She drinks water and some tea. She uses her 4 wheeled walker consistently.  Previously:   She never married. She has no children. She has a big extended family. She has 1 sister and 5 brothers. Her brother [name], sees her every day.  I first met her on 11/01/2014, at which time she reported more freezing and feeling worse. She fell the night before. She did not injure herself. She was living alone. She was getting Meals on Wheels and has a call alert button. I kept her on Sinemet 1-1/2 pills 4 times a day and Mirapex 1-1/2 mg strength one pill 3 times a day which was maxed out. She presents for a sooner than scheduled appointment due to exacerbation of her tremors. In the interim, on 12/30/2014 she was seen at [facility] senior care for a cellulitis. She was placed on Cipro. She was also seen on 01/12/15 for follow up and started on gabapentin 100 mg tid for leg pain.  She previously saw Dr. Jim Like on 02/28/2014, at which time she was kept on levodopa at the current dose and Mirapex at the current dose. Prior to that she used to see Dr. Krista Blue (05/25/13) and has also seen our nurse practitioner, Cecille Rubin, last on 01/28/14. Her symptoms date back to 1999 when she first noticed L hand stiffness, fine motor dyscontrol and gait changes. She fell last night but thankfully did not  injure herself. She was admitted to the hospital from 08/06/14 to 08/10/14 for N/V/D.   She lives alone. She has 5 brothers and 1 sister, 1 brother lives in [place] but otherwise her siblings are close by. One brother brings her supper every night. She gets Meals on Wheels. She has a call alert button. She has no family history of Parkinson's disease. She uses a 2 wheeled walker at the house. She has generalized dyskinesias. She may not drink enough water. She has lower extremity swelling but this has improved. Her short-term memory is getting worse she feels. She denies any hallucinations.   Her Past Medical History Is Significant For: Past Medical History  Diagnosis Date  . Parkinson's disease   . Hypertension   . Arthritis   . Anemia   . Cellulitis of lower leg 12/24/2011  . Chronic systolic heart failure ([place])   . Acute on chronic systolic heart failure ([place])   . Cellulitis and abscess of leg, except foot   . Hypotension, unspecified   . Helicobacter pylori (H. pylori)   . CHF (congestive heart failure) ([place])   . Anxiety   . Cataract left  . Glaucoma left  . GERD (gastroesophageal reflux disease)   . Hypercholesterolemia   . Chronic bronchitis ([place])     "usually get it q yr"  . Sleep apnea     "had test years ago; insurance wouldn't pay for mask so I never had one" (04/27/2015)  . Hypothyroidism   . History of blood transfusion     "don't remember why"  . History of bleeding peptic ulcer   . Stroke [facility]) X 3    "that  was the reason I got Parkinson's"    Her Past Surgical History Is Significant For: Past Surgical History  Procedure Laterality Date  . Hemiarthroplasty hip Right 09/24/2007    Archie Endo 01/23/2011  . Thyroidectomy    . Esophagogastroduodenoscopy  09/26/2011    Procedure: ESOPHAGOGASTRODUODENOSCOPY (EGD);  Surgeon: Zenovia Jarred, MD;  Location: [facility] ENDOSCOPY;  Service: Gastroenterology;  Laterality: N/A;  To be done at bedside.  . Esophagogastroduodenoscopy N/A 09/30/2013     Procedure: ESOPHAGOGASTRODUODENOSCOPY (EGD);  Surgeon: [name], MD;  Location: [place];  Service: Endoscopy;  Laterality: N/A;  . Esophagogastroduodenoscopy N/A 11/25/2013    Procedure: ESOPHAGOGASTRODUODENOSCOPY (EGD);  Surgeon: [name], MD;  Location: [facility] ENDOSCOPY;  Service: Endoscopy;  Laterality: N/A;  . Tonsillectomy    . Nissen fundoplication      "had OR for GERD"  . Tendon repair      Gluteus medius [facility] Endo 01/23/2011  . Back surgery    . Kyphoplasty  06/2009    T12/notes 07/17/2009    Her Family History Is Significant For: Family History  Problem Relation Age of Onset  . GER disease Mother   . Heart disease Father     Her Social History Is Significant For: Social History   Social History  . Marital Status: Single    Spouse Name: N/A  . Number of Children: 0  . Years  of Education: 12   Occupational History  .      Retired   Social History Main Topics  . Smoking status: Never Smoker   . Smokeless tobacco: Never Used  . Alcohol Use: No  . Drug Use: No  . Sexual Activity: No   Other Topics Concern  . Not on file   Social History Narrative   Patient lives at home alone. Patient is retired. Patient has high school education.   Caffeine- one daily.   Right handed.    Her Allergies Are:  Allergies  Allergen Reactions  . Codeine Other (See Comments)    Unknown allergic reaction  . Penicillins Other (See Comments)    Pt passed out in doctor's office after penicillin dose  :   Her Current Medications Are:  Outpatient Encounter Prescriptions as of 07/10/2015  Medication Sig  . carbidopa-levodopa (SINEMET IR) 25-100 MG per tablet Take 1 tablet by mouth 6 (six) times daily. Take at 6a, 9a, 12n, 3p, 6pm, 9pm  . Cholecalciferol (VITAMIN D) 2000 UNITS CAPS Take 1 capsule (2,000 Units total) by mouth daily.  Marland Kitchen docusate sodium (COLACE) 100 MG capsule Take 100 mg by mouth daily.   . Ferrous Sulfate (IRON) 325 (65 FE) MG TABS Take 1 capsule by mouth  daily. (Patient taking differently: Take 325 mg by mouth daily. )  . furosemide (LASIX) 40 MG tablet Take 40 mg by mouth daily.  Marland Kitchen latanoprost (XALATAN) 0.005 % ophthalmic solution Place 1 drop into the left eye daily at 6 PM.   . LORazepam (ATIVAN) 0.5 MG tablet Take 1 tablet (0.5 mg total) by mouth 2 (two) times daily as needed for anxiety.  . Multiple Vitamins-Minerals (MULTIVITAMIN WITH MINERALS) tablet Take 1 tablet by mouth daily.  Marland Kitchen oxyCODONE (OXY IR/ROXICODONE) 5 MG immediate release tablet Take 1 tablet (5 mg total) by mouth daily as needed for severe pain.  Vladimir Faster Glycol-Propyl Glycol (SYSTANE OP) Place 1 drop into the right eye daily at 6 PM.  . pramipexole (MIRAPEX) 1.5 MG tablet Take 1 tablet (1.5 mg total) by mouth 3 (three) times daily.   No facility-administered encounter medications on file as of 07/10/2015.  :  Review of Systems:  Out of a complete 14 point review of systems, all are reviewed and negative with the exception of these symptoms as listed below:   Review of Systems  Neurological:       Patient is here for f/u. States that since last visit she was in the hospital after having 2 falls. No new concerns.     Objective:  Neurologic Exam  Physical Exam Physical Examination:   Filed Vitals:   07/10/15 1320  BP: 98/62  Pulse: 66  Resp: 20    General Examination: The patient is a very pleasant 74 y.o. female in no acute distress. She is situated in her wheelchair. She is frail appearing. She is adequately groomed. She brought her 4 wheeled walker. She has generalized dyskinesias.  HEENT: Normocephalic, atraumatic, pupils are equal, round and reactive to light and accommodation. Extraocular tracking shows moderate saccadic breakdown without nystagmus noted. There is limitation to upper gaze. There is mild decrease in eye blink rate. Hearing is intact. Face is symmetric with mild facial masking and normal facial sensation. There is no lip, neck or jaw  tremor. Neck is mildly rigid with intact passive ROM. There are no carotid bruits on auscultation. Oropharynx exam reveals moderate mouth dryness. No significant airway crowding is noted. Mallampati is  class II. Tongue protrudes centrally and palate elevates symmetrically. There is no drooling.   Chest: is clear to auscultation without wheezing, rhonchi or crackles noted.  Heart: sounds are regular and normal without murmurs, rubs or gallops noted.   Abdomen: is soft, non-tender and non-distended with normal bowel sounds appreciated on auscultation.  Extremities: There is trace pitting edema in the distal lower extremities bilaterally. Chronic stasis-like changes are noted in the distal legs bilaterally. There is mild erythema distally. There are spider veins.  Skin: is warm and dry with no trophic changes noted. Age-related changes are noted on the skin.   Musculoskeletal: exam reveals no obvious joint deformities, tenderness, joint swelling or erythema, except for arthritic changes in her hands.  Neurologically:  Mental status: The patient is awake and alert, paying good  attention. She is able to completely provide the history.  She is oriented to: person, place, situation, day of week, month of year and year. Her memory, attention, language and knowledge are mildly impaired. She has a mild degree of bradyphrenia. Speech is moderately hypophonic with minimal dysarthria noted. Mood is congruent and affect is normal.   Cranial nerves are as described above under HEENT exam. She is leaning to the right. It is difficult to examine her shoulder height and shoulder strength. Motor exam: Normal bulk, and strength for age is noted. There are mild to moderate generalized dyskinesias noted. Tone is mildly rigid with absence of cogwheeling. There is overall moderate bradykinesia. There is no drift or rebound.  There is a mild LUE and LLE resting tremor.  Romberg is not tested as she is not safe to stand  unassisted.   Reflexes are 1+ in the upper extremities and trace in the lower extremities.   Fine motor skills exam: Finger taps are moderately impaired on the right and moderately impaired on the left. Hand movements are mildly impaired on the right and moderately impaired on the left. RAP (rapid alternating patting) is moderately impaired on the right and moderately impaired on the left. Foot taps are moderately impaired on the right and moderately impaired on the left. Foot agility (in the form of heel stomping) is mildly impaired on the right and moderately impaired on the left. All of this largely stable.    Cerebellar testing shows no dysmetria or intention tremor on finger to nose testing. Heel to shin is unremarkable bilaterally. There is no truncal or gait ataxia.   Sensory exam is intact to light touch in the upper and lower extremities.   Gait, station and balance: She stands with difficulty and has to push herself up. Posture is moderately stooped and she also has evidence of scoliosis. She reports back pain. She has no freezing while initiating movement. She walks slowly with a shuffle, using her 4 wheeled walker with seat, she turns in 4 steps. Balance is impaired.   Assessment and Plan:   In summary, [name] is a very pleasant 74 year old female with an underlying complex medical history of hypertension, arthritis, anemia, cellulitis, chronic systolic heart failure and cor pulmonale, peptic ulcer disease, and chronic low back pain with history of T12 compression fracture, status post kyphoplasty in October 2010, who presents for follow-up consultation of her parkinsonism. Her history and physical exam are in keeping with, left-sided predominant PD, complicated by dyskinesias, overall frailty, lower extremity swelling, cellulitis, freezing and recurrent falls. She has recent hospital admissions and inpatient rehabilitation. She is currently in home health therapies as well. She was  not  able to tolerate amantadine. She is advised to continue with Sinemet 1 pill 6 times a day, namely at 6 AM, 9, 12, 3 PM, 6 PM and 9 PM. She is advised to continue Mirapex 1.5 mg strength one pill 3 times a day, at 6 AM, 12 and 6 PM. I  asked her to stay well-hydrated and use her walker at all times. She is at fall risk. Thankfully, she now has a live-in caretaker. I renewed her prescriptions today. I would like to see her back in 3 months, sooner if needed. I answered all their questions today and the patient and Jackelyn Poling were in agreement.  I spent 25 minutes in total face-to-face time with the patient, more than 50% of which was spent in counseling and coordination of care, reviewing test results, reviewing medication and discussing or reviewing the diagnosis of PD, its prognosis and treatment options.

## 2015-07-10 NOTE — Patient Instructions (Addendum)
You are at fall risk. Please use your walker at all times and drink enough water. Change positions slowly, turn slowly. We will keep your sinemet and your mirapex the same. Monitor your leg swelling.   Let me know, if you need another referral for home health PT.   I will see you in 3 months.

## 2015-07-11 DIAGNOSIS — H04123 Dry eye syndrome of bilateral lacrimal glands: Secondary | ICD-10-CM | POA: Diagnosis not present

## 2015-07-11 DIAGNOSIS — H401121 Primary open-angle glaucoma, left eye, mild stage: Secondary | ICD-10-CM | POA: Diagnosis not present

## 2015-07-11 DIAGNOSIS — H2513 Age-related nuclear cataract, bilateral: Secondary | ICD-10-CM | POA: Diagnosis not present

## 2015-07-28 DIAGNOSIS — M545 Low back pain: Secondary | ICD-10-CM | POA: Diagnosis not present

## 2015-07-28 DIAGNOSIS — M5136 Other intervertebral disc degeneration, lumbar region: Secondary | ICD-10-CM | POA: Diagnosis not present

## 2015-07-28 DIAGNOSIS — G20 Parkinson's disease: Secondary | ICD-10-CM | POA: Diagnosis not present

## 2015-07-31 DIAGNOSIS — G20 Parkinson's disease: Secondary | ICD-10-CM | POA: Diagnosis not present

## 2015-07-31 DIAGNOSIS — M5136 Other intervertebral disc degeneration, lumbar region: Secondary | ICD-10-CM | POA: Diagnosis not present

## 2015-07-31 DIAGNOSIS — M545 Low back pain: Secondary | ICD-10-CM | POA: Diagnosis not present

## 2015-08-01 DIAGNOSIS — M545 Low back pain: Secondary | ICD-10-CM | POA: Diagnosis not present

## 2015-08-01 DIAGNOSIS — Z79891 Long term (current) use of opiate analgesic: Secondary | ICD-10-CM | POA: Diagnosis not present

## 2015-08-01 DIAGNOSIS — G20 Parkinson's disease: Secondary | ICD-10-CM | POA: Diagnosis not present

## 2015-08-01 DIAGNOSIS — G894 Chronic pain syndrome: Secondary | ICD-10-CM | POA: Diagnosis not present

## 2015-08-01 DIAGNOSIS — M41125 Adolescent idiopathic scoliosis, thoracolumbar region: Secondary | ICD-10-CM | POA: Diagnosis not present

## 2015-08-01 DIAGNOSIS — M5136 Other intervertebral disc degeneration, lumbar region: Secondary | ICD-10-CM | POA: Diagnosis not present

## 2015-08-02 DIAGNOSIS — M5136 Other intervertebral disc degeneration, lumbar region: Secondary | ICD-10-CM | POA: Diagnosis not present

## 2015-08-02 DIAGNOSIS — M545 Low back pain: Secondary | ICD-10-CM | POA: Diagnosis not present

## 2015-08-02 DIAGNOSIS — G20 Parkinson's disease: Secondary | ICD-10-CM | POA: Diagnosis not present

## 2015-08-08 DIAGNOSIS — G20 Parkinson's disease: Secondary | ICD-10-CM | POA: Diagnosis not present

## 2015-08-08 DIAGNOSIS — M5136 Other intervertebral disc degeneration, lumbar region: Secondary | ICD-10-CM | POA: Diagnosis not present

## 2015-08-08 DIAGNOSIS — M545 Low back pain: Secondary | ICD-10-CM | POA: Diagnosis not present

## 2015-08-09 ENCOUNTER — Telehealth: Payer: Self-pay | Admitting: *Deleted

## 2015-08-09 DIAGNOSIS — M545 Low back pain: Secondary | ICD-10-CM | POA: Diagnosis not present

## 2015-08-09 DIAGNOSIS — M5136 Other intervertebral disc degeneration, lumbar region: Secondary | ICD-10-CM | POA: Diagnosis not present

## 2015-08-09 DIAGNOSIS — G20 Parkinson's disease: Secondary | ICD-10-CM | POA: Diagnosis not present

## 2015-08-09 NOTE — Telephone Encounter (Signed)
I recommend she try melatonin 5mg  one hour before bedtime.  It is over the counter.

## 2015-08-09 NOTE — Telephone Encounter (Signed)
Tracey with [facility] called and stated that patient is complaining of falling asleep and staying asleep. Taking as needed Ativan/Lorazepam but not effective. Would like to know if there is anything that you can prescribe to help with this. Please Advise.   Patient uses [facility]

## 2015-08-10 DIAGNOSIS — M545 Low back pain: Secondary | ICD-10-CM | POA: Diagnosis not present

## 2015-08-10 DIAGNOSIS — G20 Parkinson's disease: Secondary | ICD-10-CM | POA: Diagnosis not present

## 2015-08-10 DIAGNOSIS — M5136 Other intervertebral disc degeneration, lumbar region: Secondary | ICD-10-CM | POA: Diagnosis not present

## 2015-08-10 MED ORDER — MELATONIN 5 MG PO TABS: ORAL_TABLET | ORAL | Status: DC

## 2015-08-10 NOTE — Telephone Encounter (Signed)
[name] with [facility] Notified and faxed Rx to [facility]

## 2015-08-14 DIAGNOSIS — M5136 Other intervertebral disc degeneration, lumbar region: Secondary | ICD-10-CM | POA: Diagnosis not present

## 2015-08-14 DIAGNOSIS — M545 Low back pain: Secondary | ICD-10-CM | POA: Diagnosis not present

## 2015-08-14 DIAGNOSIS — G20 Parkinson's disease: Secondary | ICD-10-CM | POA: Diagnosis not present

## 2015-08-16 DIAGNOSIS — M545 Low back pain: Secondary | ICD-10-CM | POA: Diagnosis not present

## 2015-08-16 DIAGNOSIS — G20 Parkinson's disease: Secondary | ICD-10-CM | POA: Diagnosis not present

## 2015-08-16 DIAGNOSIS — M5136 Other intervertebral disc degeneration, lumbar region: Secondary | ICD-10-CM | POA: Diagnosis not present

## 2015-08-18 DIAGNOSIS — G20 Parkinson's disease: Secondary | ICD-10-CM | POA: Diagnosis not present

## 2015-08-18 DIAGNOSIS — M545 Low back pain: Secondary | ICD-10-CM | POA: Diagnosis not present

## 2015-08-18 DIAGNOSIS — M5136 Other intervertebral disc degeneration, lumbar region: Secondary | ICD-10-CM | POA: Diagnosis not present

## 2015-08-22 DIAGNOSIS — G20 Parkinson's disease: Secondary | ICD-10-CM | POA: Diagnosis not present

## 2015-08-22 DIAGNOSIS — M5136 Other intervertebral disc degeneration, lumbar region: Secondary | ICD-10-CM | POA: Diagnosis not present

## 2015-08-22 DIAGNOSIS — M545 Low back pain: Secondary | ICD-10-CM | POA: Diagnosis not present

## 2015-08-23 DIAGNOSIS — G20 Parkinson's disease: Secondary | ICD-10-CM | POA: Diagnosis not present

## 2015-08-23 DIAGNOSIS — M545 Low back pain: Secondary | ICD-10-CM | POA: Diagnosis not present

## 2015-08-23 DIAGNOSIS — M5136 Other intervertebral disc degeneration, lumbar region: Secondary | ICD-10-CM | POA: Diagnosis not present

## 2015-08-24 ENCOUNTER — Ambulatory Visit (INDEPENDENT_AMBULATORY_CARE_PROVIDER_SITE_OTHER): Payer: Medicare Other | Admitting: Nurse Practitioner

## 2015-08-24 ENCOUNTER — Encounter: Payer: Self-pay | Admitting: Nurse Practitioner

## 2015-08-24 VITALS — BP 106/68 | HR 54 | Temp 98.3°F | Resp 20 | Ht 64.0 in | Wt 162.2 lb

## 2015-08-24 DIAGNOSIS — R350 Frequency of micturition: Secondary | ICD-10-CM

## 2015-08-24 DIAGNOSIS — G20 Parkinson's disease: Secondary | ICD-10-CM | POA: Diagnosis not present

## 2015-08-24 DIAGNOSIS — M5136 Other intervertebral disc degeneration, lumbar region: Secondary | ICD-10-CM | POA: Diagnosis not present

## 2015-08-24 DIAGNOSIS — M545 Low back pain: Secondary | ICD-10-CM | POA: Diagnosis not present

## 2015-08-24 NOTE — Patient Instructions (Signed)
Increase water intake to drink 2- 16 oz a day To cont drinking other fluids  Will send urine off for culture to evaluate for infection

## 2015-08-24 NOTE — Progress Notes (Signed)
Patient ID: [name], female   DOB: 07-25-1941, 74 y.o.   MRN: [id number]    PCP: Hollace Kinnier, DO  Advanced Directive information Does patient have an advance directive?: Yes  Allergies  Allergen Reactions  . Codeine Other (See Comments)    Unknown allergic reaction  . Penicillins Other (See Comments)    Pt passed out in doctor's office after penicillin dose    Chief Complaint  Patient presents with  . Acute Visit    frequent urination and pain at her lower left back started about 2 months ago      HPI: Patient is a 74 y.o. female seen in the office today due to possible UTI, increase in urination and pain in lower back for 2 months. Denies pain when urination but the frequency has increased. No abdominal discomfort. Having pain to left lower back. No incontinence.   Getting up every 45 mins to go to the bathroom, sometimes minimal urine sometimes normal out. No fevers.  Drinking 1 bottle water a day.   Review of Systems:  Review of Systems  Constitutional: Negative for activity change, fatigue and unexpected weight change.  Respiratory: Negative for shortness of breath.   Cardiovascular: Negative for palpitations and leg swelling.  Gastrointestinal: Negative for constipation and abdominal distention.  Genitourinary: Positive for urgency, frequency, flank pain (right) and difficulty urinating. Negative for dysuria, hematuria, decreased urine volume, vaginal bleeding and pelvic pain.  Psychiatric/Behavioral: Negative for confusion and agitation.    Past Medical History  Diagnosis Date  . Parkinson's disease   . Hypertension   . Arthritis   . Anemia   . Cellulitis of lower leg 12/24/2011  . Chronic systolic heart failure ([place])   . Acute on chronic systolic heart failure ([place])   . Cellulitis and abscess of leg, except foot   . Hypotension, unspecified   . Helicobacter pylori (H. pylori)   . CHF (congestive heart failure) ([place])   . Anxiety   . Cataract left  .  Glaucoma left  . GERD (gastroesophageal reflux disease)   . Hypercholesterolemia   . Chronic bronchitis ([place])     "usually get it q yr"  . Sleep apnea     "had test years ago; insurance wouldn't pay for mask so I never had one" (04/27/2015)  . Hypothyroidism   . History of blood transfusion     "don't remember why"  . History of bleeding peptic ulcer   . Stroke [facility]) X 3    "that  was the reason I got Parkinson's"   Past Surgical History  Procedure Laterality Date  . Hemiarthroplasty hip Right 09/24/2007    Archie Endo 01/23/2011  . Thyroidectomy    . Esophagogastroduodenoscopy  09/26/2011    Procedure: ESOPHAGOGASTRODUODENOSCOPY (EGD);  Surgeon: Zenovia Jarred, MD;  Location: [facility] ENDOSCOPY;  Service: Gastroenterology;  Laterality: N/A;  To be done at bedside.  . Esophagogastroduodenoscopy N/A 09/30/2013    Procedure: ESOPHAGOGASTRODUODENOSCOPY (EGD);  Surgeon: [name], MD;  Location: [place];  Service: Endoscopy;  Laterality: N/A;  . Esophagogastroduodenoscopy N/A 11/25/2013    Procedure: ESOPHAGOGASTRODUODENOSCOPY (EGD);  Surgeon: [name], MD;  Location: [facility] ENDOSCOPY;  Service: Endoscopy;  Laterality: N/A;  . Tonsillectomy    . Nissen fundoplication      "had OR for GERD"  . Tendon repair      Gluteus medius [facility] Endo 01/23/2011  . Back surgery    . Kyphoplasty  06/2009    T12/notes 07/17/2009   Social History:  reports that she has never smoked. She has never used smokeless tobacco. She reports that she does not drink alcohol or use illicit drugs.  Family History  Problem Relation Age of Onset  . GER disease Mother   . Heart disease Father     Medications: Patient's Medications  New Prescriptions   No medications on file  Previous Medications   CARBIDOPA-LEVODOPA (SINEMET IR) 25-100 MG TABLET    Take 1 tablet by mouth 6 (six) times daily. Take at 6a, 9a, 12n, 3p, 6pm, 9pm   CHOLECALCIFEROL (VITAMIN D) 2000 UNITS CAPS    Take 1 capsule (2,000 Units total) by mouth daily.    DOCUSATE SODIUM (COLACE) 100 MG CAPSULE    Take 100 mg by mouth daily.    FERROUS SULFATE (IRON) 325 (65 FE) MG TABS    Take 1 capsule by mouth daily.   FUROSEMIDE (LASIX) 40 MG TABLET    Take 40 mg by mouth daily.   LATANOPROST (XALATAN) 0.005 % OPHTHALMIC SOLUTION    Place 1 drop into the left eye daily at 6 PM.    LORAZEPAM (ATIVAN) 0.5 MG TABLET    Take 1 tablet (0.5 mg total) by mouth 2 (two) times daily as needed for anxiety.   MELATONIN 5 MG TABS    Take one tablet by mouth one hour before bedtime for sleep   MULTIPLE VITAMINS-MINERALS (MULTIVITAMIN WITH MINERALS) TABLET    Take 1 tablet by mouth daily.   OXYCODONE (OXY IR/ROXICODONE) 5 MG IMMEDIATE RELEASE TABLET    Take 1 tablet (5 mg total) by mouth daily as needed for severe pain.   POLYETHYL GLYCOL-PROPYL GLYCOL (SYSTANE OP)    Place 1 drop into the right eye daily at 6 PM.   PRAMIPEXOLE (MIRAPEX) 1.5 MG TABLET    Take 1 tablet (1.5 mg total) by mouth 3 (three) times daily.  Modified Medications   No medications on file  Discontinued Medications   No medications on file     Physical Exam:  Filed Vitals:   08/24/15 1136  BP: 106/68  Pulse: 54  Temp: 98.3 F (36.8 C)  TempSrc: Oral  Resp: 20  Height: 5\' 4"  (1.626 m)  Weight: 162 lb 3.2 oz (73.573 kg)  SpO2: 96%   Body mass index is 27.83 kg/(m^2).  Physical Exam  Constitutional: She is oriented to person, place, and time. No distress.  Cardiovascular: Normal rate, regular rhythm and normal heart sounds.   Pulmonary/Chest: Effort normal and breath sounds normal.  Abdominal: Soft. Bowel sounds are normal. She exhibits no distension and no mass. There is no tenderness. There is no rebound and no guarding.  Genitourinary: Vagina normal.  Musculoskeletal:  Chronic dyskinesias--more severe today  Neurological: She is alert and oriented to person, place, and time.  Skin: Skin is warm and dry.  Psychiatric: She has a normal mood and affect.    Labs reviewed: Basic  Metabolic Panel:  Recent Labs  04/23/15 2025  04/27/15 1410 04/27/15 2112 04/28/15 0534 07/06/15 1359  NA  --   < > 141  --  140 142  K  --   < > 4.3  --  3.8 4.2  CL  --   < > 107  --  103 106  CO2  --   < > 26  --  27 22  GLUCOSE  --   < > 100*  --  92 105*  BUN  --   < > 19  --  16  18  CREATININE 0.98  < > 0.80  --  0.92 0.69  CALCIUM  --   < > 9.2  --  8.8* 9.0  MG 1.9  --   --  2.1  --   --   PHOS 3.7  --   --   --   --   --   TSH  --   --   --  2.277  --   --   < > = values in this interval not displayed. Liver Function Tests:  Recent Labs  04/03/15 1350 04/24/15 0609 04/27/15 1410  AST 18 30 24   ALT 7 6* 24  ALKPHOS 66 52 67  BILITOT 0.4 1.0 0.8  PROT 5.8* 5.3* 6.2*  ALBUMIN 3.8 2.9* 3.4*   No results for input(s): LIPASE, AMYLASE in the last 8760 hours. No results for input(s): AMMONIA in the last 8760 hours. CBC:  Recent Labs  04/23/15 1743  04/26/15 0545 04/27/15 1410 04/28/15 0534 07/06/15 1359  WBC 7.7  < > 7.5 10.0 7.8 7.9  NEUTROABS 5.6  --   --  7.6  --  5.8  HGB 13.0  < > 13.9 14.0 13.0  --   HCT 39.3  < > 41.9 42.7 40.0 36.8  MCV 84.7  < > 84.6 85.1 86.2  --   PLT 146*  < > 127* 147* 135*  --   < > = values in this interval not displayed. Lipid Panel: No results for input(s): CHOL, HDL, LDLCALC, TRIG, CHOLHDL, LDLDIRECT in the last 8760 hours. TSH:  Recent Labs  04/27/15 2112  TSH 2.277   A1C: No results found for: HGBA1C   Assessment/Plan 1. Urinary frequency -pt unable to void to get a urine sample, 2 cc obtained via I&O cath by sterile technique.  -encouraged to drink more water 2-16 oz bottles, along with other fluid intake - Culture, Urine to rule out UTI however most likely symptoms are due to Over active bladder -will rule out infection and if negative will consider starting on medication of OAB  Analayah Brooke K. Harle Battiest  [facility] [phone] 8 am - 5 pm) [phone]  (after hours)

## 2015-08-25 LAB — URINE CULTURE

## 2015-08-28 ENCOUNTER — Other Ambulatory Visit: Payer: Self-pay | Admitting: Nurse Practitioner

## 2015-08-28 DIAGNOSIS — M5136 Other intervertebral disc degeneration, lumbar region: Secondary | ICD-10-CM | POA: Diagnosis not present

## 2015-08-28 DIAGNOSIS — M545 Low back pain: Secondary | ICD-10-CM | POA: Diagnosis not present

## 2015-08-28 DIAGNOSIS — N3281 Overactive bladder: Secondary | ICD-10-CM

## 2015-08-28 DIAGNOSIS — G20 Parkinson's disease: Secondary | ICD-10-CM | POA: Diagnosis not present

## 2015-08-28 MED ORDER — MIRABEGRON ER 25 MG PO TB24: 25.0000 mg | ORAL_TABLET | Freq: Every day | ORAL | Status: DC

## 2015-08-28 NOTE — Progress Notes (Signed)
Patient notified and left samples up front. Patient wants to keep her 10/06/15 appointment.

## 2015-08-29 ENCOUNTER — Telehealth: Payer: Self-pay | Admitting: *Deleted

## 2015-08-29 DIAGNOSIS — R5383 Other fatigue: Secondary | ICD-10-CM

## 2015-08-29 DIAGNOSIS — M545 Low back pain: Secondary | ICD-10-CM | POA: Diagnosis not present

## 2015-08-29 DIAGNOSIS — M5136 Other intervertebral disc degeneration, lumbar region: Secondary | ICD-10-CM | POA: Diagnosis not present

## 2015-08-29 DIAGNOSIS — R05 Cough: Secondary | ICD-10-CM

## 2015-08-29 DIAGNOSIS — R059 Cough, unspecified: Secondary | ICD-10-CM

## 2015-08-29 DIAGNOSIS — G20 Parkinson's disease: Secondary | ICD-10-CM | POA: Diagnosis not present

## 2015-08-29 NOTE — Telephone Encounter (Signed)
Ebony Hail with [facility] called and stated that patient was not feeling well. BP 102/62 and T: 99.7-99.9.   I called and spoke with patient and she stated "she don't feel good", back hurting, no cough, no ST, has some congestion. Still has some pressure/burning with urination (was seen on 08/24/15 for possible UTI but U/A came back Negative) Please Advise.

## 2015-08-29 NOTE — Telephone Encounter (Signed)
Patient and caregiver notified and agreed. Caregiver will take her in the morning for the x-ray.

## 2015-08-29 NOTE — Telephone Encounter (Signed)
Let's have her get a chest xray done at [facility].  I will put in the order. She needs to drink at least 6 8oz glasses of water each day.

## 2015-08-30 ENCOUNTER — Ambulatory Visit
Admission: RE | Admit: 2015-08-30 | Discharge: 2015-08-30 | Disposition: A | Discharge status: Home / Self Care | Payer: Medicare Other | Source: Ambulatory Visit | Attending: Internal Medicine | Admitting: Internal Medicine

## 2015-08-30 DIAGNOSIS — R05 Cough: Secondary | ICD-10-CM

## 2015-08-30 DIAGNOSIS — R059 Cough, unspecified: Secondary | ICD-10-CM

## 2015-08-30 DIAGNOSIS — R5383 Other fatigue: Secondary | ICD-10-CM

## 2015-08-30 DIAGNOSIS — R0602 Shortness of breath: Secondary | ICD-10-CM | POA: Diagnosis not present

## 2015-08-30 NOTE — Addendum Note (Signed)
Addended by: Rafael Bihari A on: 08/30/2015 09:08 AM   Modules accepted: Orders

## 2015-08-30 NOTE — Telephone Encounter (Signed)
Heather with [facility] called and stated patient was there for a chest X-ray and there was no order for one. Placed order.

## 2015-08-31 DIAGNOSIS — M5136 Other intervertebral disc degeneration, lumbar region: Secondary | ICD-10-CM | POA: Diagnosis not present

## 2015-08-31 DIAGNOSIS — G20 Parkinson's disease: Secondary | ICD-10-CM | POA: Diagnosis not present

## 2015-08-31 DIAGNOSIS — M545 Low back pain: Secondary | ICD-10-CM | POA: Diagnosis not present

## 2015-09-06 DIAGNOSIS — G20 Parkinson's disease: Secondary | ICD-10-CM | POA: Diagnosis not present

## 2015-09-06 DIAGNOSIS — M545 Low back pain: Secondary | ICD-10-CM | POA: Diagnosis not present

## 2015-09-06 DIAGNOSIS — M5136 Other intervertebral disc degeneration, lumbar region: Secondary | ICD-10-CM | POA: Diagnosis not present

## 2015-09-07 DIAGNOSIS — M545 Low back pain: Secondary | ICD-10-CM | POA: Diagnosis not present

## 2015-09-07 DIAGNOSIS — G20 Parkinson's disease: Secondary | ICD-10-CM | POA: Diagnosis not present

## 2015-09-07 DIAGNOSIS — M5136 Other intervertebral disc degeneration, lumbar region: Secondary | ICD-10-CM | POA: Diagnosis not present

## 2015-09-12 DIAGNOSIS — G20 Parkinson's disease: Secondary | ICD-10-CM | POA: Diagnosis not present

## 2015-09-12 DIAGNOSIS — M545 Low back pain: Secondary | ICD-10-CM | POA: Diagnosis not present

## 2015-09-12 DIAGNOSIS — M5136 Other intervertebral disc degeneration, lumbar region: Secondary | ICD-10-CM | POA: Diagnosis not present

## 2015-09-21 ENCOUNTER — Telehealth: Payer: Self-pay | Admitting: Internal Medicine

## 2015-09-21 DIAGNOSIS — M5136 Other intervertebral disc degeneration, lumbar region: Secondary | ICD-10-CM | POA: Diagnosis not present

## 2015-09-21 DIAGNOSIS — G20 Parkinson's disease: Secondary | ICD-10-CM | POA: Diagnosis not present

## 2015-09-21 DIAGNOSIS — M545 Low back pain: Secondary | ICD-10-CM | POA: Diagnosis not present

## 2015-09-21 NOTE — Telephone Encounter (Signed)
FYILinus Orn with [facility]- # [phone] called.  Patient is being discharged from [facility] she has completed her Physical Therpy, education and patent is okay with all Medication.   A few things of concern:  Patient is still have frequent urination and she fell recently, but did not not get hurt...cdavis

## 2015-09-21 NOTE — Telephone Encounter (Signed)
Her urinary frequency is likely due to her Parkinson's progressing as are her falls.  Does she still have her 24 hour caregiver?

## 2015-10-06 ENCOUNTER — Encounter: Payer: Self-pay | Admitting: Internal Medicine

## 2015-10-06 ENCOUNTER — Ambulatory Visit (INDEPENDENT_AMBULATORY_CARE_PROVIDER_SITE_OTHER): Payer: Medicare Other | Admitting: Internal Medicine

## 2015-10-06 VITALS — BP 136/72 | HR 72 | Temp 98.5°F | Ht 64.0 in | Wt 167.0 lb

## 2015-10-06 DIAGNOSIS — M545 Low back pain, unspecified: Secondary | ICD-10-CM

## 2015-10-06 DIAGNOSIS — R5383 Other fatigue: Secondary | ICD-10-CM | POA: Diagnosis not present

## 2015-10-06 DIAGNOSIS — G20 Parkinson's disease: Secondary | ICD-10-CM

## 2015-10-06 DIAGNOSIS — K5900 Constipation, unspecified: Secondary | ICD-10-CM | POA: Diagnosis not present

## 2015-10-06 DIAGNOSIS — G20A1 Parkinson's disease without dyskinesia, without mention of fluctuations: Secondary | ICD-10-CM

## 2015-10-06 DIAGNOSIS — I5042 Chronic combined systolic (congestive) and diastolic (congestive) heart failure: Secondary | ICD-10-CM

## 2015-10-06 DIAGNOSIS — K5909 Other constipation: Secondary | ICD-10-CM

## 2015-10-06 DIAGNOSIS — I872 Venous insufficiency (chronic) (peripheral): Secondary | ICD-10-CM | POA: Diagnosis not present

## 2015-10-06 MED ORDER — OXYCODONE HCL 5 MG PO TABS: 5.0000 mg | ORAL_TABLET | Freq: Two times a day (BID) | ORAL | Status: DC | PRN

## 2015-10-06 NOTE — Progress Notes (Signed)
Patient ID: [name], female   DOB: October 14, 1940, 75 y.o.   MRN: [id number]   Location: [facility] Provider: Rexene Edison. Mariea Clonts, D.O., C.M.D.  Code Status: DNR Goals of Care: Advanced Directive information Does patient have an advance directive?: Yes, Type of Advance Directive: Out of facility DNR (pink MOST or yellow form), Pre-existing out of facility DNR order (yellow form or pink MOST form): Pink MOST form placed in chart (order not valid for inpatient use)  Chief Complaint  Patient presents with  . Medical Management of Chronic Issues    medication management. Here with care giver [name]  . legs    get red and knees hurt, bilateral    [name] seen in the office today for med mgt of chronic diseases and concern about redness of bilateral legs and pain in knees.  Discussed venous insufficiency.  Legs are chronically mildly tender and red.  Erythema not present in mornings, but worsens through day along with edema.  Has not been wearing compression hose.  Also has some mild chronic hyperpigmentation of the lower legs.  Goes to bed early and wakes up early.  Her caregiver says she only sleeps a couple of hours before she gets up.  (9-11pm), but pt disagrees.  Low back pain worse.  Is out of her pain pills and could not buy more due to cost--has $400 deductible.  [facility] didn't take discount card.  Going to try to get at [facility] with the card as she needs them, but can't pay deductible.  Explained how that works and that it will remain costly until the 400 is paid  Sees Dr. Rexene Alberts 1/17 for her Parkinson's.  No changes with it.  Overactive bladder:  Continues to get up quite frequently at night and can be incontinent.  Her caregiver notes that the frequency has not changed since myrbetriq was started.    Review of Systems:  Review of Systems  Constitutional: Negative for fever and chills.  HENT: Negative for congestion.   Respiratory: Negative for cough  and shortness of breath.   Cardiovascular: Negative for chest pain and leg swelling.  Gastrointestinal: Positive for constipation. Negative for abdominal pain.       Has bm every 3 days right now with prunes  Genitourinary: Positive for urgency and frequency. Negative for dysuria.  Musculoskeletal: Positive for myalgias and back pain. Negative for falls.  Skin: Negative for itching and rash.       Mild erythema of lower legs along with baseline hyperpigmentation, no pitting, mild generalized tenderness and tingling  Neurological: Positive for tingling and sensory change. Negative for dizziness.  Psychiatric/Behavioral: Positive for memory loss. The patient has insomnia.     Past Medical History  Diagnosis Date  . Parkinson's disease   . Hypertension   . Arthritis   . Anemia   . Cellulitis of lower leg 12/24/2011  . Chronic systolic heart failure ([place])   . Acute on chronic systolic heart failure ([place])   . Cellulitis and abscess of leg, except foot   . Hypotension, unspecified   . Helicobacter pylori (H. pylori)   . CHF (congestive heart failure) ([place])   . Anxiety   . Cataract left  . Glaucoma left  . GERD (gastroesophageal reflux disease)   . Hypercholesterolemia   . Chronic bronchitis ([place])     "usually get it q yr"  . Sleep apnea     "had test years ago; insurance wouldn't pay  for mask so I never had one" (04/27/2015)  . Hypothyroidism   . History of blood transfusion     "don't remember why"  . History of bleeding peptic ulcer   . Stroke [facility]) X 3    "that  was the reason I got Parkinson's"    Past Surgical History  Procedure Laterality Date  . Hemiarthroplasty hip Right 09/24/2007    Ar[facility] Endo 01/23/2011  . Thyroidectomy    . Esophagogastroduodenoscopy  09/26/2011    Procedure: ESOPHAGOGASTRODUODENOSCOPY (EGD);  Surgeon: Zenovia Jarred, MD;  Location: [facility] ENDOSCOPY;  Service: Gastroenterology;  Laterality: N/A;  To be done at bedside.  . Esophagogastroduodenoscopy N/A 09/30/2013     Procedure: ESOPHAGOGASTRODUODENOSCOPY (EGD);  Surgeon: [name], MD;  Location: [facility];  Service: Endoscopy;  Laterality: N/A;  . Esophagogastroduodenoscopy N/A 11/25/2013    Procedure: ESOPHAGOGASTRODUODENOSCOPY (EGD);  Surgeon: [name], MD;  Location: [facility] ENDOSCOPY;  Service: Endoscopy;  Laterality: N/A;  . Tonsillectomy    . Nissen fundoplication      "had OR for GERD"  . Tendon repair      Gluteus medius [facility] 01/23/2011  . Back surgery    . Kyphoplasty  06/2009    T12/notes 07/17/2009    Allergies  Allergen Reactions  . Codeine Other (See Comments)    Unknown allergic reaction  . Penicillins Other (See Comments)    Pt passed out in doctor's office after penicillin dose      Medication List       This list is accurate as of: 10/06/15 11:22 AM.  Always use your most recent med list.               carbidopa-levodopa 25-100 MG tablet  Commonly known as:  SINEMET IR  Take 1 tablet by mouth 6 (six) times daily. Take at 6a, 9a, 12n, 3p, 6pm, 9pm     docusate sodium 100 MG capsule  Commonly known as:  COLACE  Take 100 mg by mouth daily.     furosemide 40 MG tablet  Commonly known as:  LASIX  Take 40 mg by mouth daily.     Iron 325 (65 Fe) MG Tabs  Take 1 capsule by mouth daily.     latanoprost 0.005 % ophthalmic solution  Commonly known as:  XALATAN  Place 1 drop into the left eye daily at 6 PM.     LORazepam 0.5 MG tablet  Commonly known as:  ATIVAN  Take 1 tablet (0.5 mg total) by mouth 2 (two) times daily as needed for anxiety.     Melatonin 5 MG Tabs  Take one tablet by mouth one hour before bedtime for sleep     mirabegron ER 25 MG Tb24 tablet  Commonly known as:  MYRBETRIQ  Take 1 tablet (25 mg total) by mouth daily.     multivitamin with minerals tablet  Take 1 tablet by mouth daily.     oxyCODONE 5 MG immediate release tablet  Commonly known as:  Oxy IR/ROXICODONE  Take 1 tablet (5 mg total) by mouth daily as needed for severe  pain.     pramipexole 1.5 MG tablet  Commonly known as:  MIRAPEX  Take 1 tablet (1.5 mg total) by mouth 3 (three) times daily.     SYSTANE OP  Place 1 drop into the right eye daily at 6 PM.     Vitamin D 2000 units Caps  Take 1 capsule (2,000 Units total) by mouth daily.  Health Maintenance  Topic Date Due  . TETANUS/TDAP  06/07/1959  . COLONOSCOPY  06/06/1990  . ZOSTAVAX  06/06/2000  . DEXA SCAN  06/06/2005  . INFLUENZA VACCINE  04/23/2016  . PNA vac Low Risk Adult  Completed    Physical Exam: Filed Vitals:   10/06/15 1103  BP: 136/72  Pulse: 72  Temp: 98.5 F (36.9 C)  TempSrc: Oral  Height: 5\' 4"  (1.626 m)  Weight: 167 lb (75.751 kg)  SpO2: 95%   Body mass index is 28.65 kg/(m^2). Physical Exam  Constitutional: She appears well-developed and well-nourished. No distress.  Cardiovascular: Normal rate, regular rhythm and normal heart sounds.   Pulmonary/Chest: Effort normal and breath sounds normal. No respiratory distress.  Abdominal: Soft. Bowel sounds are normal. She exhibits no distension. There is no tenderness.  Musculoskeletal:  Walks with shuffling gait, stooped posture with rollator walker; also is tender over thoracic and lumbar spine today  Neurological: She is alert.  Skin: Skin is warm. There is erythema.  Hyperpigmentation of lower legs, warmth equal on both lower legs and upper legs; no ulcerations, no pitting edema    Labs reviewed: Basic Metabolic Panel:  Recent Labs  04/23/15 2025  04/27/15 1410 04/27/15 2112 04/28/15 0534 07/06/15 1359  NA  --   < > 141  --  140 142  K  --   < > 4.3  --  3.8 4.2  CL  --   < > 107  --  103 106  CO2  --   < > 26  --  27 22  GLUCOSE  --   < > 100*  --  92 105*  BUN  --   < > 19  --  16 18  CREATININE 0.98  < > 0.80  --  0.92 0.69  CALCIUM  --   < > 9.2  --  8.8* 9.0  MG 1.9  --   --  2.1  --   --   PHOS 3.7  --   --   --   --   --   TSH  --   --   --  2.277  --   --   < > = values in this  interval not displayed. Liver Function Tests:  Recent Labs  04/03/15 1350 04/24/15 0609 04/27/15 1410  AST 18 30 24   ALT 7 6* 24  ALKPHOS 66 52 67  BILITOT 0.4 1.0 0.8  PROT 5.8* 5.3* 6.2*  ALBUMIN 3.8 2.9* 3.4*   No results for input(s): LIPASE, AMYLASE in the last 8760 hours. No results for input(s): AMMONIA in the last 8760 hours. CBC:  Recent Labs  04/23/15 1743  04/26/15 0545 04/27/15 1410 04/28/15 0534 07/06/15 1359  WBC 7.7  < > 7.5 10.0 7.8 7.9  NEUTROABS 5.6  --   --  7.6  --  5.8  HGB 13.0  < > 13.9 14.0 13.0  --   HCT 39.3  < > 41.9 42.7 40.0 36.8  MCV 84.7  < > 84.6 85.1 86.2 84  PLT 146*  < > 127* 147* 135* 187  < > = values in this interval not displayed.  Assessment/Plan 1. Midline low back pain without sciatica -ongoing -worse at present due to no pain medication -new Rx for oxycodone provided #60 to obtain with discount card if possible as pt cannot afford 70 to pay toward deductible at present  2. Chronic venous insufficiency -explained in detail that this is the  cause of the erythema and tenderness of her lower legs as the day goes on -advised to use compression hose during the day -also has neuropathy contributing to the pain portion  3. Parkinson's disease ([place]) -cont sinemet and mirapex  4. Chronic combined systolic and diastolic congestive heart failure (HCC) -cont daily lasix and resume compression hose, avoid high sodium foods and adding sodium to diet, report changes in weight  5. Chronic constipation -cont colace and prunes to help compensate for iron and pain meds as well as her PD contributing to slow motility -off miralax--was too much for her at times  6. Other fatigue -cont melatonin to help with falling asleep -pt does not seem concerned about insomnia but her caregiver says she is up most of the night -suspect fatigue also is due to her Parkinson's, meds, and generalized debility  Next appt:  01/04/2016 for med mgt, check  labs at that time (none today)  Jmya Uliano L. Bowe Sidor, D.O. [facility] [address], [place] [address] Cell Phone (Mon-Fri 8am-5pm):  [phone] On Call:  [phone] & follow prompts after 5pm & weekends Office Phone:  [phone] Office Fax:  [phone]

## 2015-10-06 NOTE — Patient Instructions (Signed)
Wear the compression hose during the day.  This should help the redness and soreness of your legs.

## 2015-10-10 ENCOUNTER — Encounter: Payer: Self-pay | Admitting: Neurology

## 2015-10-10 ENCOUNTER — Ambulatory Visit (INDEPENDENT_AMBULATORY_CARE_PROVIDER_SITE_OTHER): Payer: Medicare Other | Admitting: Neurology

## 2015-10-10 VITALS — BP 126/78 | HR 88 | Resp 22 | Ht 64.0 in | Wt 171.0 lb

## 2015-10-10 DIAGNOSIS — M7989 Other specified soft tissue disorders: Secondary | ICD-10-CM | POA: Diagnosis not present

## 2015-10-10 DIAGNOSIS — R441 Visual hallucinations: Secondary | ICD-10-CM | POA: Diagnosis not present

## 2015-10-10 DIAGNOSIS — G20B1 Parkinson's disease with dyskinesia, without mention of fluctuations: Secondary | ICD-10-CM

## 2015-10-10 DIAGNOSIS — Z9181 History of falling: Secondary | ICD-10-CM | POA: Diagnosis not present

## 2015-10-10 DIAGNOSIS — R269 Unspecified abnormalities of gait and mobility: Secondary | ICD-10-CM | POA: Diagnosis not present

## 2015-10-10 DIAGNOSIS — G20 Parkinson's disease: Secondary | ICD-10-CM

## 2015-10-10 DIAGNOSIS — G20A1 Parkinson's disease without dyskinesia, without mention of fluctuations: Secondary | ICD-10-CM

## 2015-10-10 DIAGNOSIS — G249 Dystonia, unspecified: Secondary | ICD-10-CM

## 2015-10-10 MED ORDER — PRAMIPEXOLE DIHYDROCHLORIDE 1 MG PO TABS: 1.0000 mg | ORAL_TABLET | Freq: Three times a day (TID) | ORAL | Status: DC

## 2015-10-10 NOTE — Progress Notes (Signed)
Subjective:    Patient ID: [name] is a 75 y.o. female.  HPI     Interim history:   [name] is a 75 year old right-handed woman with an underlying complex medical history of hypertension, arthritis, anemia, cellulitis, chronic systolic heart failure, peptic ulcer disease, and chronic low back pain with history of T12 compression fracture, status post kyphoplasty in October 2010, who presents for follow-up consultation of her Parkinson's disease, complicated by motor complications, motor fluctuations and recurrent falls. The patient is accompanied by Jackelyn Poling, friend and now live-in caretaker today. I last saw her on 07/10/15, at which time she reported feeling okay. She had recently been in [name]. [name] moved in with her to help. Patient had not fallen since her [name] stay. She was getting HH therapies. She was using her 4 wheeled walker consistently. I suggested she continue with Sinemet 1 tablet 6 times a day and Mirapex 1.5 mg 3 times a day.  Today, 10/10/2015: She reports feeling stable. [name] reports no recent falls in the last couple of months but patient reports that she fell in the kitchen to 3 weeks ago after she leg go of her walker and fell backwards. She does not indicate that she herself thankfully. She has ongoing issues with severe dyskinesias. She has visual hallucinations. She has ongoing issues with lower extremity swelling. She is somewhat sleepy during the day and falls asleep easily when sedentary such as while watching TV or trying to read something. She does not sleep very well at night. She was seen in follow-up by Dr. Mariea Clonts on 10/06/2015 and I reviewed the office note. She was complaining of increased lower back pain and was given a prescription for oxycodone as needed.  Previously:   I saw her on 01/16/2015 for sooner than scheduled appointment due to more freezing reported. She reported taking Ativan 0.5 mg as needed for anxiety but not every day. She was taking Sinemet 1  pill 6 times a day. She was on Mirapex 1.5 mg 3 times a day. She was encouraged to continue these medications. In addition, I suggested as needed Parcopa 25-100 milligrams strength half a pill up to twice daily to help with freezing. In addition, she was encouraged to start gabapentin slowly which was prescribed by another physician for leg pain. She was in [facility] at this time and enjoying it. Her cellulitis was improving, on Abx. Her freezing episodes were happening in the late morning and late afternoons. She reported no recent falls. In the interim, the patient called on 02/06/2015 reporting that the medication changes were not helpful. She was advised to stop the Parcopa and instead increase her Sinemet by alternating 1 pill with 1-1/2 pills for a total of 6 doses a day. Of note, she no showed for an appointment on 03/07/2015.  In the interim, she was seen by Cecille Rubin, NP, on 03/14/2015, at which time, her medication including Sinemet and Mirapex were kept the same.  In the interim, she was hospitalized twice, on 04/23/2015 through 04/26/2015 for global weakness and failure to thrive, as well as worsening Parkinson symptoms. Amantadine was added to her regimen. She was discharged with home health therapies as she could not afford skilled nursing for rehabilitation. She was readmitted on 04/27/2015 for altered mental status in the context of UTI and recently started amantadine. She was discharged on 05/01/2015. She was treated for UTI and amantadine was discontinued. She was transferred to skilled nursing facility.  She never married.  She has no children. She has a big extended family. She has 1 sister and 5 brothers. Her brother [name], sees her every day.  I first met her on 11/01/2014, at which time she reported more freezing and feeling worse. She fell the night before. She did not injure herself. She was living alone. She was getting Meals on Wheels and has a call alert button. I kept  her on Sinemet 1-1/2 pills 4 times a day and Mirapex 1-1/2 mg strength one pill 3 times a day which was maxed out. She presents for a sooner than scheduled appointment due to exacerbation of her tremors. In the interim, on 12/30/2014 she was seen at [facility] senior care for a cellulitis. She was placed on Cipro. She was also seen on 01/12/15 for follow up and started on gabapentin 100 mg tid for leg pain.   She previously saw Dr. Jim Like on 02/28/2014, at which time she was kept on levodopa at the current dose and Mirapex at the current dose. Prior to that she used to see Dr. Krista Blue (05/25/13) and has also seen our nurse practitioner, Cecille Rubin, last on 01/28/14. Her symptoms date back to 1999 when she first noticed L hand stiffness, fine motor dyscontrol and gait changes. She fell last night but thankfully did not injure herself. She was admitted to the hospital from 08/06/14 to 08/10/14 for N/V/D.   She lives alone. She has 5 brothers and 1 sister, 1 brother lives in [place] but otherwise her siblings are close by. One brother brings her supper every night. She gets Meals on Wheels. She has a call alert button. She has no family history of Parkinson's disease. She uses a 2 wheeled walker at the house. She has generalized dyskinesias. She may not drink enough water. She has lower extremity swelling but this has improved. Her short-term memory is getting worse she feels. She denies any hallucinations.  Her Past Medical History Is Significant For: Past Medical History  Diagnosis Date  . Parkinson's disease   . Hypertension   . Arthritis   . Anemia   . Cellulitis of lower leg 12/24/2011  . Chronic systolic heart failure ([place])   . Acute on chronic systolic heart failure ([place])   . Cellulitis and abscess of leg, except foot   . Hypotension, unspecified   . Helicobacter pylori (H. pylori)   . CHF (congestive heart failure) ([place])   . Anxiety   . Cataract left  . Glaucoma left  . GERD  (gastroesophageal reflux disease)   . Hypercholesterolemia   . Chronic bronchitis ([place])     "usually get it q yr"  . Sleep apnea     "had test years ago; insurance wouldn't pay for mask so I never had one" (04/27/2015)  . Hypothyroidism   . History of blood transfusion     "don't remember why"  . History of bleeding peptic ulcer   . Stroke [facility]) X 3    "that  was the reason I got Parkinson's"    Her Past Surgical History Is Significant For: Past Surgical History  Procedure Laterality Date  . Hemiarthroplasty hip Right 09/24/2007    Archie Endo 01/23/2011  . Thyroidectomy    . Esophagogastroduodenoscopy  09/26/2011    Procedure: ESOPHAGOGASTRODUODENOSCOPY (EGD);  Surgeon: Zenovia Jarred, MD;  Location[facility] ENDOSCOPY;  Service: Gastroenterology;  Laterality: N/A;  To be done at bedside.  . Esophagogastroduodenoscopy N/A 09/30/2013    Procedure: ESOPHAGOGASTRODUODENOSCOPY (EGD);  Surgeon: [name], MD;  Location: [facility] ENDOSCOPY;  Service: Endoscopy;  Laterality: N/A;  . Esophagogastroduodenoscopy N/A 11/25/2013    Procedure: ESOPHAGOGASTRODUODENOSCOPY (EGD);  Surgeon: [name], MD;  Location: [facility] ENDOSCOPY;  Service: Endoscopy;  Laterality: N/A;  . Tonsillectomy    . Nissen fundoplication      "had OR for GERD"  . Tendon repair      Gluteus medius Archie Endo 01/23/2011  . Back surgery    . Kyphoplasty  06/2009    T12/notes 07/17/2009    Her Family History Is Significant For: Family History  Problem Relation Age of Onset  . GER disease Mother   . Heart disease Father     Her Social History Is Significant For: Social History   Social History  . Marital Status: Single    Spouse Name: N/A  . Number of Children: 0  . Years of Education: 12   Occupational History  .      Retired   Social History Main Topics  . Smoking status: Never Smoker   . Smokeless tobacco: Never Used  . Alcohol Use: No  . Drug Use: No  . Sexual Activity: No   Other Topics Concern  . None   Social History  Narrative   Patient lives at home alone. Patient is retired. Patient has high school education.   Caffeine- one daily.   Right handed.    Her Allergies Are:  Allergies  Allergen Reactions  . Codeine Other (See Comments)    Unknown allergic reaction  . Penicillins Other (See Comments)    Pt passed out in doctor's office after penicillin dose  :   Her Current Medications Are:  Outpatient Encounter Prescriptions as of 10/10/2015  Medication Sig  . carbidopa-levodopa (SINEMET IR) 25-100 MG tablet Take 1 tablet by mouth 6 (six) times daily. Take at 6a, 9a, 12n, 3p, 6pm, 9pm  . Cholecalciferol (VITAMIN D) 2000 UNITS CAPS Take 1 capsule (2,000 Units total) by mouth daily.  Marland Kitchen docusate sodium (COLACE) 100 MG capsule Take 100 mg by mouth daily.   . Ferrous Sulfate (IRON) 325 (65 FE) MG TABS Take 1 capsule by mouth daily. (Patient taking differently: Take 325 mg by mouth daily. )  . furosemide (LASIX) 40 MG tablet Take 40 mg by mouth daily.  Marland Kitchen latanoprost (XALATAN) 0.005 % ophthalmic solution Place 1 drop into the left eye daily at 6 PM.   . LORazepam (ATIVAN) 0.5 MG tablet Take 1 tablet (0.5 mg total) by mouth 2 (two) times daily as needed for anxiety.  . Melatonin 5 MG TABS Take one tablet by mouth one hour before bedtime for sleep  . Multiple Vitamins-Minerals (MULTIVITAMIN WITH MINERALS) tablet Take 1 tablet by mouth daily.  Marland Kitchen oxyCODONE (OXY IR/ROXICODONE) 5 MG immediate release tablet Take 1 tablet (5 mg total) by mouth 2 (two) times daily as needed for severe pain.  Vladimir Faster Glycol-Propyl Glycol (SYSTANE OP) Place 1 drop into the right eye daily at 6 PM.  . pramipexole (MIRAPEX) 1.5 MG tablet Take 1 tablet (1.5 mg total) by mouth 3 (three) times daily.   No facility-administered encounter medications on file as of 10/10/2015.  :  Review of Systems:  Out of a complete 14 point review of systems, all are reviewed and negative with the exception of these symptoms as listed below:    Review of Systems  Neurological:       Patient is here for f/u. Denies any new concerns or problems.     Objective:  Neurologic  Exam  Physical Exam Physical Examination:   Filed Vitals:   10/10/15 1356  BP: 126/78  Pulse: 88  Resp: 22     General Examination: The patient is a very pleasant 75 y.o. female in no acute distress. She is situated in her wheelchair. She is frail appearing. She is adequately groomed. She brought her 4 wheeled walker. She has generalized dyskinesias, which are moderately severe today.  HEENT: Normocephalic, atraumatic, pupils are equal, round and reactive to light and accommodation. Extraocular tracking shows moderate saccadic breakdown without nystagmus noted. There is limitation to upper gaze. There is mild decrease in eye blink rate. Hearing is intact. Face is symmetric with mild facial masking and normal facial sensation. There is no lip, neck or jaw tremor. Neck is mildly rigid with intact passive ROM. There are no carotid bruits on auscultation. Oropharynx exam reveals moderate to severe mouth dryness. No significant airway crowding is noted. Mallampati is class II. Tongue protrudes centrally and palate elevates symmetrically. There is no drooling.   Chest: is clear to auscultation without wheezing, rhonchi or crackles noted.  Heart: sounds are regular and normal without murmurs, rubs or gallops noted.   Abdomen: is soft, non-tender and non-distended with normal bowel sounds appreciated on auscultation.  Extremities: There is 1+ pitting edema in the distal lower extremities bilaterally. She is wearing compression stockings up to the knees bilaterally.  Skin: is warm and dry with no trophic changes noted. Age-related changes are noted on the skin.   Musculoskeletal: exam reveals no obvious joint deformities, tenderness, joint swelling or erythema, except for arthritic changes in her hands.  Neurologically:  Mental status: The patient is awake and  alert, paying good attention. She is able to completely provide the history and Jackelyn Poling adds some details. She is oriented to: person, place, situation, day of week, month of year and year. Her memory, attention, language and knowledge are mildly impaired. She has a mild degree of bradyphrenia. Speech is moderately hypophonic with moderate dysarthria noted. Mood is congruent and affect is normal.   Cranial nerves are as described above under HEENT exam. She is leaning to the right. It is difficult to examine her shoulder height and shoulder strength. Motor exam: Normal bulk, and strength for age is noted. There are moderate generalized dyskinesias noted. Tone is mildly rigid with absence of cogwheeling. There is overall moderate bradykinesia. There is no drift or rebound.  There is a mild LUE and LLE resting tremor.  Romberg is not tested as she is not safe to stand unassisted.   Reflexes are 1+ in the upper extremities and trace in the lower extremities.   Fine motor skills exam: Finger taps are moderately impaired on the right and moderately impaired on the left. Hand movements are mildly impaired on the right and moderately impaired on the left. RAP (rapid alternating patting) is moderately impaired on the right and moderately impaired on the left. Foot taps are moderately impaired on the right and moderately impaired on the left. Foot agility (in the form of heel stomping) is mildly impaired on the right and moderately impaired on the left.  Cerebellar testing shows no dysmetria or intention tremor on finger to nose testing. Heel to shin is unremarkable bilaterally. There is no truncal or gait ataxia.   Sensory exam is intact to light touch in the upper and lower extremities.   Gait, station and balance: She stands with difficulty and has to push herself up, requiring 2 attempts, but no help  needed. Posture is moderately stooped and she also has evidence of scoliosis. She has no freezing while  initiating movement. She walks slowly with a shuffle, using her 4 wheeled walker with seat, she turns in 4 steps. Balance is impaired.   Assessment and Plan:   In summary, [name] is a very pleasant 75 year old female with an underlying complex medical history of hypertension, arthritis, anemia, cellulitis, chronic systolic heart failure and cor pulmonale, peptic ulcer disease, and chronic low back pain with history of T12 compression fracture, status post kyphoplasty in October 2010, who presents for follow-up consultation of her parkinsonism. Her history and physical exam are in keeping with, left-sided predominant PD, complicated by dyskinesias, overall frailty, lower extremity swelling, cellulitis, freezing and recurrent falls. She has had hospital admissions and inpatient rehabilitation. She finished home health therapies. She was not able to tolerate amantadine. She is advised to continue with Sinemet 1 pill 6 times a day, namely at 6 AM, 9, 12, 3 PM, 6 PM and 9 PM. She is advised to reduce Mirapex to 1 mg strength, 1 pill 3 times a day at 6 AM, 12 and 6 PM, secondary to worsening dyskinesias, lower extremity swelling, daytime somnolence and hallucinations. I  asked her to stay well-hydrated and use her walker at all times. She is at fall risk and thankfully, she now has a live-in caretaker, Jackelyn Poling. I adjusted her Mirapex prescription today and she did not need a refill on Sinemet. I would like to see her back in 4 months, sooner if needed. I answered all their questions today and the patient and Jackelyn Poling were in agreement.  I spent 25 minutes in total face-to-face time with the patient, more than 50% of which was spent in counseling and coordination of care, reviewing test results, reviewing medication and discussing or reviewing the diagnosis of PD, its prognosis and treatment options.

## 2015-10-10 NOTE — Patient Instructions (Signed)
We will try to reduce your Mirapex to 1 mg, 1 pill 3 times a day due to sleepiness during the day, dyskinesias, hallucinations and leg swelling. I have adjusted your prescription in that regard. We will keep the Sinemet the same.

## 2015-10-25 ENCOUNTER — Other Ambulatory Visit: Payer: Self-pay | Admitting: *Deleted

## 2015-10-25 DIAGNOSIS — G20 Parkinson's disease: Secondary | ICD-10-CM

## 2015-10-25 MED ORDER — LORAZEPAM 0.5 MG PO TABS: 0.5000 mg | ORAL_TABLET | Freq: Two times a day (BID) | ORAL | Status: DC | PRN

## 2015-10-25 NOTE — Telephone Encounter (Signed)
Patient requested refill to be sent to pharmacy.  

## 2015-10-31 DIAGNOSIS — M5136 Other intervertebral disc degeneration, lumbar region: Secondary | ICD-10-CM | POA: Diagnosis not present

## 2015-10-31 DIAGNOSIS — G894 Chronic pain syndrome: Secondary | ICD-10-CM | POA: Diagnosis not present

## 2015-10-31 DIAGNOSIS — M41125 Adolescent idiopathic scoliosis, thoracolumbar region: Secondary | ICD-10-CM | POA: Diagnosis not present

## 2015-10-31 DIAGNOSIS — Z79891 Long term (current) use of opiate analgesic: Secondary | ICD-10-CM | POA: Diagnosis not present

## 2015-11-02 ENCOUNTER — Other Ambulatory Visit: Payer: Self-pay

## 2015-11-02 NOTE — Patient Outreach (Addendum)
[place] [facility]) Care Management  11/02/2015  [name] 05-Dec-1940 [id number]   Referral Date: 11-01-15 Referral Source: Next Gen Referral Reason: Tier 1 Outreach Attempt: First outreach attempt successful Social: Patient has a friend who lives with her [name].  [name] helps patient with her care and takes her to her appointments.    Conditions:  Patient admits to hypertension, Parkinson's, and heart failure.  Patient has had some problems with falls due to her Parkinson's.  Patient reports she has instances when her legs just will not move.  Patient seeing neurology for her Parkinson's and medications are being adjusted.  Patient reports that she has had some problems with shortness of breath and swelling.  Patient admits to taking a fluid pill daily.  Patient does not weigh but does have a scale.  Patient states she does eat a no salt diet.     Medications:  Patient voiced no issues with her medications and is able to afford her medications.     Services: Patient is agreeable to [facility] for health coach.  Plan: Patient will be kept with health coach for disease management of heart failure.  RN Health Coach will send patient calendar and welcome packet. RN Health Coach will contact patient within one month to complete initial assessment.    Jone Baseman, RN, MSN [place] [phone]

## 2015-11-08 ENCOUNTER — Telehealth: Payer: Self-pay | Admitting: Neurology

## 2015-11-08 ENCOUNTER — Telehealth: Payer: Self-pay

## 2015-11-08 DIAGNOSIS — G20 Parkinson's disease: Secondary | ICD-10-CM

## 2015-11-08 MED ORDER — CARBIDOPA-LEVODOPA 25-100 MG PO TABS: 1.0000 | ORAL_TABLET | Freq: Every day | ORAL | Status: DC

## 2015-11-08 NOTE — Telephone Encounter (Signed)
Pt called requesting refill for carbidopa-levodopa (SINEMET IR) 25-100 MG tablet to be sent to [place] at [facility]. Pt sts she is not using [employer] anymore. She will be out tonight.

## 2015-11-08 NOTE — Telephone Encounter (Signed)
Letter mailed to patient regarding overdue mammogram. [name] 

## 2015-11-09 ENCOUNTER — Encounter: Payer: Self-pay | Admitting: Internal Medicine

## 2015-11-13 ENCOUNTER — Other Ambulatory Visit: Payer: Self-pay

## 2015-11-13 NOTE — Patient Outreach (Signed)
[facility]) Care Management  [place]  11/13/2015   [name] [birth year] [id number]  Subjective: Telephone call to patient for initial assessment.  Patient has caregiver Jackelyn Poling who lives with her to assist her and take her to her appointments.  Patient reports she deals with her parkinson's but her symptoms some days are better than others. Patient reports she is compliant with her medications.  Patient reports some problems with falls due to her parkinson's but denies any since last conversation.  Discussed with patient falls safety.  Will send fall prevention information.  Heart Failure: Patient reports she was unaware of her heart failure diagnosis.  Discussed with patient what heart failure is and how to manage.  Will send heart failure information.       Objective:   Current Medications:  Current Outpatient Prescriptions  Medication Sig Dispense Refill  . carbidopa-levodopa (SINEMET IR) 25-100 MG tablet Take 1 tablet by mouth 6 (six) times daily. Take at 6a, 9a, 12n, 3p, 6pm, 9pm 540 tablet 0  . Cholecalciferol (VITAMIN D) 2000 UNITS CAPS Take 1 capsule (2,000 Units total) by mouth daily. 30 capsule 3  . docusate sodium (COLACE) 100 MG capsule Take 100 mg by mouth daily.     . Ferrous Sulfate (IRON) 325 (65 FE) MG TABS Take 1 capsule by mouth daily. 30 each 6  . furosemide (LASIX) 40 MG tablet Take 40 mg by mouth daily. Reported on 11/13/2015    . latanoprost (XALATAN) 0.005 % ophthalmic solution Place 1 drop into the left eye daily at 6 PM.     . LORazepam (ATIVAN) 0.5 MG tablet Take 1 tablet (0.5 mg total) by mouth 2 (two) times daily as needed for anxiety. 30 tablet 0  . Melatonin 5 MG TABS Take one tablet by mouth one hour before bedtime for sleep 30 each 1  . Multiple Vitamins-Minerals (MULTIVITAMIN WITH MINERALS) tablet Take 1 tablet by mouth daily.    Marland Kitchen oxyCODONE (OXY IR/ROXICODONE) 5 MG immediate release tablet Take 1 tablet (5 mg total) by mouth 2  (two) times daily as needed for severe pain. 60 tablet 0  . Polyethyl Glycol-Propyl Glycol (SYSTANE OP) Place 1 drop into the right eye daily at 6 PM.    . pramipexole (MIRAPEX) 1 MG tablet Take 1 tablet (1 mg total) by mouth 3 (three) times daily. 270 tablet 3  . [DISCONTINUED] bisoprolol (ZEBETA) 5 MG tablet Take 5 mg by mouth daily.       No current facility-administered medications for this visit.    Functional Status:  In your present state of health, do you have any difficulty performing the following activities: 11/13/2015 04/27/2015  Hearing? N N  Vision? N N  Difficulty concentrating or making decisions? N Y  Walking or climbing stairs? Y Y  Dressing or bathing? N N  Doing errands, shopping? [name]  Preparing Food and eating ? Y -  Using the Toilet? N -  In the past six months, have you accidently leaked urine? Y -  Do you have problems with loss of bowel control? N -  Managing your Medications? N -  Managing your Finances? N -  Housekeeping or managing your Housekeeping? Y -    Fall/Depression Screening: PHQ 2/9 Scores 11/13/2015 11/02/2015 11/02/2015 08/25/2014 07/21/2014 08/02/2013  PHQ - 2 Score 0 0 0 0 0 0    Assessment: Patient will benefit from health coach outreach for diease management and support.  Plan:  THN CM  Care Plan Problem One        Most Recent Value   Care Plan Problem One  Heart Failure Knowledge Deficit   Role Documenting the Problem One  [place] for Problem One  Active   THN Long Term Goal (31-90 days)  Patient will be able to identify heart failure zones within 90 days.    THN Long Term Goal Start Date  11/13/15   Interventions for Problem One Long Term Goal  Discussed heart failure zone chart and purpose.    THN CM Short Term Goal #1 (0-30 days)  Patient will be able to verbalize symptoms of heart failure green zone within 30 days.   THN CM Short Term Goal #1 Start Date  11/13/15   Interventions for Short Term Goal #1  Explained to  patient green zone of heart failure zone chart.    THN CM Short Term Goal #2 (0-30 days)  Patient will be able to name three foods that are high in sodium within 30 days.   THN CM Short Term Goal #2 Start Date  11/13/15   Interventions for Short Term Goal #2  Discussed with patient how frozen/fresh/and canned foods are different in sodium content.    THN CM Care Plan Problem Two        Most Recent Value   Care Plan Problem Two  Risk for falls   Role Documenting the Problem Two  [place] for Problem Two  Active   Interventions for Problem Two Long Term Goal   RN Health Coach discussed with patient fall prevention and importance of utilizing assistive devices.     THN Long Term Goal (31-90) days  Patient will have no falls within the next 60 days.    THN Long Term Goal Start Date  11/13/15     RN Health Coach will provide ongoing education for patient on heart failure through phone calls and sending printed information to patient for further discussion.  RN Health Coach sent welcome packet with consent to patient as well as printed information on health failure.  RN Health Coach will send initial barriers letter, assessment, and care plan to primary care physician.  RN Health Coach will contact patient within one month and patient agrees to next contact.   Jone Baseman, RN, MSN [place] [phone]

## 2015-11-16 ENCOUNTER — Telehealth: Payer: Self-pay

## 2015-11-16 DIAGNOSIS — I10 Essential (primary) hypertension: Secondary | ICD-10-CM

## 2015-11-16 NOTE — Telephone Encounter (Signed)
She needs to come in for an appt and labwork.  For now, I need her to drink two more bottles of water each day.

## 2015-11-16 NOTE — Telephone Encounter (Signed)
Patient called and scheduled an appointment for next Tuesday. FYI

## 2015-11-16 NOTE — Telephone Encounter (Signed)
Patient called and stated that she believes that she may be dehydrated. She states that she gets very hot during the night and her mouth has been excessively dry. She states that she has began to hurt all over in the last week and describes the pain as constant aching. She states that there have not been any urinary changes.   Patient states that she only drinks 1 bottle (16.9 oz) of water and 1 cup (8 oz) of tea daily. She states that she does not drink more due to having Parkinson's and having difficulty getting out of bed to go to bathroom.   Please advise.

## 2015-11-16 NOTE — Telephone Encounter (Signed)
Spoke with patient and advised her that she needs to come in for appt and labwork. Also advised her to drink 2 additional bottles of water per day.  Patient stated that she would try to drink the additional water but was unsure if she could due to increased chance of nightly bathroom visits. Patient stated that she would call next week to make an appt once she was able to find transportation.

## 2015-11-16 NOTE — Telephone Encounter (Signed)
Per Janett Billow patient needs labs prior to appointment, I called patient and she asked her friend Jackelyn Poling if she could bring her Monday for labs and [name]. Appointment scheduled for Monday for labs

## 2015-11-17 NOTE — Telephone Encounter (Signed)
Please make sure her caregiver knows to push fluids over the weekend so she does not end up back at the hospital.  At least 3 of those 16oz water bottles per day.  She MUST drink that even if she has to urinate more.

## 2015-11-17 NOTE — Telephone Encounter (Signed)
I called patient back to stress the importance of drinking 3 16 oz bottles of water daily to avoid hospital visit. Patient verbalized understanding

## 2015-11-20 ENCOUNTER — Other Ambulatory Visit: Payer: Medicare Other

## 2015-11-20 DIAGNOSIS — I10 Essential (primary) hypertension: Secondary | ICD-10-CM

## 2015-11-21 ENCOUNTER — Ambulatory Visit (INDEPENDENT_AMBULATORY_CARE_PROVIDER_SITE_OTHER): Payer: Medicare Other | Admitting: Nurse Practitioner

## 2015-11-21 ENCOUNTER — Encounter: Payer: Self-pay | Admitting: Nurse Practitioner

## 2015-11-21 VITALS — BP 120/70 | HR 88 | Temp 98.3°F | Resp 20 | Ht 64.0 in | Wt 173.6 lb

## 2015-11-21 DIAGNOSIS — G4700 Insomnia, unspecified: Secondary | ICD-10-CM | POA: Diagnosis not present

## 2015-11-21 DIAGNOSIS — R61 Generalized hyperhidrosis: Secondary | ICD-10-CM

## 2015-11-21 LAB — BASIC METABOLIC PANEL
BUN/Creatinine Ratio: 24 (ref 11–26)
BUN: 18 mg/dL (ref 8–27)
CALCIUM: 8.9 mg/dL (ref 8.7–10.3)
CHLORIDE: 100 mmol/L (ref 96–106)
CO2: 24 mmol/L (ref 18–29)
Creatinine, Ser: 0.76 mg/dL (ref 0.57–1.00)
GFR calc non Af Amer: 77 mL/min/{1.73_m2} (ref >59)
GFR, EST AFRICAN AMERICAN: 89 mL/min/{1.73_m2} (ref >59)
GLUCOSE: 104 mg/dL — AB (ref 65–99)
POTASSIUM: 4.5 mmol/L (ref 3.5–5.2)
Sodium: 139 mmol/L (ref 134–144)

## 2015-11-21 NOTE — Progress Notes (Signed)
Patient ID: CHERELLE RENNER, female   DOB: 1941/04/25, 75 y.o.   MRN: [id number]    PCP: Hollace Kinnier, DO  Advanced Directive information Does patient have an advance directive?: Yes, Type of Advance Directive: Out of facility DNR (pink MOST or yellow form), Pre-existing out of facility DNR order (yellow form or pink MOST form): Pink MOST form placed in chart (order not valid for inpatient use), Does patient want to make changes to advanced directive?: No - Patient declined  Allergies  Allergen Reactions  . Codeine Other (See Comments)    Unknown allergic reaction  . Penicillins Other (See Comments)    Pt passed out in doctor's office after penicillin dose    Chief Complaint  Patient presents with  . Medical Management of Chronic Issues    ? dehydration since Saturday, not sleeping well at night.     [name] seen in the office today due to possible dehydration. Pt was worried she was dehydrated because she had an episode were she could not get out of bed and was hurting all over and was sweating. She has not drinking enough water. Reports she was only drinking 1 bottle of water a day, called office last week. Was instructed to increased fluid intake to 3 bottles of water. She reports she chronically has had episodes of sweating at night. There has been no further episode of hurting like she had.   Review of Systems:  Review of Systems  Constitutional: Negative for fever and chills.  HENT: Negative for congestion.   Respiratory: Negative for cough and shortness of breath.   Cardiovascular: Negative for chest pain and leg swelling.  Gastrointestinal: Positive for constipation. Negative for abdominal pain.  Genitourinary: Negative for dysuria.  Musculoskeletal: Positive for myalgias and back pain.  Skin: Negative for rash.  Neurological: Positive for tremors. Negative for dizziness and headaches.    Past Medical History  Diagnosis Date  . Parkinson's disease    . Hypertension   . Arthritis   . Anemia   . Cellulitis of lower leg 12/24/2011  . Chronic systolic heart failure ([place])   . Acute on chronic systolic heart failure ([place])   . Cellulitis and abscess of leg, except foot   . Hypotension, unspecified   . Helicobacter pylori (H. pylori)   . CHF (congestive heart failure) ([place])   . Anxiety   . Cataract left  . Glaucoma left  . GERD (gastroesophageal reflux disease)   . Hypercholesterolemia   . Chronic bronchitis ([place])     "usually get it q yr"  . Sleep apnea     "had test years ago; insurance wouldn't pay for mask so I never had one" (04/27/2015)  . Hypothyroidism   . History of blood transfusion     "don't remember why"  . History of bleeding peptic ulcer   . Stroke [facility]) X 3    "that  was the reason I got Parkinson's"   Past Surgical History  Procedure Laterality Date  . Hemiarthroplasty hip Right 09/24/2007    Ar[facility] Endo 01/23/2011  . Thyroidectomy    . Esophagogastroduodenoscopy  09/26/2011    Procedure: ESOPHAGOGASTRODUODENOSCOPY (EGD);  Surgeon: Zenovia Jarred, MD;  Location: [facility] ENDOSCOPY;  Service: Gastroenterology;  Laterality: N/A;  To be done at bedside.  . Esophagogastroduodenoscopy N/A 09/30/2013    Procedure: ESOPHAGOGASTRODUODENOSCOPY (EGD);  Surgeon: [name], MD;  Location: [place];  Service: Endoscopy;  Laterality: N/A;  . Esophagogastroduodenoscopy N/A 11/25/2013  Procedure: ESOPHAGOGASTRODUODENOSCOPY (EGD);  Surgeon: [name], MD;  Location: [facility] ENDOSCOPY;  Service: Endoscopy;  Laterality: N/A;  . Tonsillectomy    . Nissen fundoplication      "had OR for GERD"  . Tendon repair      Gluteus medius [facility] 01/23/2011  . Back surgery    . Kyphoplasty  06/2009    T12/notes 07/17/2009   Social History:   reports that she has never smoked. She has never used smokeless tobacco. She reports that she does not drink alcohol or use illicit drugs.  Family History  Problem Relation Age of Onset  . GER disease Mother     . Heart disease Father     Medications: Patient's Medications  New Prescriptions   No medications on file  Previous Medications   CARBIDOPA-LEVODOPA (SINEMET IR) 25-100 MG TABLET    Take 1 tablet by mouth 6 (six) times daily. Take at 6a, 9a, 12n, 3p, 6pm, 9pm   CHOLECALCIFEROL (VITAMIN D) 2000 UNITS CAPS    Take 1 capsule (2,000 Units total) by mouth daily.   DOCUSATE SODIUM (COLACE) 100 MG CAPSULE    Take 100 mg by mouth daily.    FERROUS SULFATE (IRON) 325 (65 FE) MG TABS    Take 1 capsule by mouth daily.   FUROSEMIDE (LASIX) 40 MG TABLET    Take 40 mg by mouth daily. Reported on 11/13/2015   LATANOPROST (XALATAN) 0.005 % OPHTHALMIC SOLUTION    Place 1 drop into the left eye daily at 6 PM.    LORAZEPAM (ATIVAN) 0.5 MG TABLET    Take 1 tablet (0.5 mg total) by mouth 2 (two) times daily as needed for anxiety.   MELATONIN 5 MG TABS    Take one tablet by mouth one hour before bedtime for sleep   MULTIPLE VITAMINS-MINERALS (MULTIVITAMIN WITH MINERALS) TABLET    Take 1 tablet by mouth daily.   OXYCODONE (OXY IR/ROXICODONE) 5 MG IMMEDIATE RELEASE TABLET    Take 1 tablet (5 mg total) by mouth 2 (two) times daily as needed for severe pain.   POLYETHYL GLYCOL-PROPYL GLYCOL (SYSTANE OP)    Place 1 drop into the right eye daily at 6 PM.   PRAMIPEXOLE (MIRAPEX) 1 MG TABLET    Take 1 tablet (1 mg total) by mouth 3 (three) times daily.  Modified Medications   No medications on file  Discontinued Medications   No medications on file     Physical Exam:  Filed Vitals:   11/21/15 1426  BP: 120/70  Pulse: 88  Temp: 98.3 F (36.8 C)  TempSrc: Oral  Resp: 20  Height: 5\' 4"  (1.626 m)  Weight: 173 lb 9.6 oz (78.744 kg)  SpO2: 96%   Body mass index is 29.78 kg/(m^2).  Physical Exam  Constitutional: She is oriented to person, place, and time. No distress.  HENT:  Mouth/Throat: Oropharynx is clear and moist. No oropharyngeal exudate.  Cardiovascular: Normal rate, regular rhythm and normal  heart sounds.   Pulmonary/Chest: Effort normal and breath sounds normal.  Abdominal: Soft. Bowel sounds are normal.  Genitourinary: Vagina normal.  Musculoskeletal:  Chronic dyskinesias  Neurological: She is alert and oriented to person, place, and time.  Skin: Skin is warm and dry. No rash noted. No erythema.  Psychiatric: She has a normal mood and affect.    Labs reviewed: Basic Metabolic Panel:  Recent Labs  04/23/15 2025  04/27/15 2112 04/28/15 0534 07/06/15 1359 11/20/15 1451  NA  --   < >  --  140 142 139  K  --   < >  --  3.8 4.2 4.5  CL  --   < >  --  103 106 100  CO2  --   < >  --  27 22 24   GLUCOSE  --   < >  --  92 105* 104*  BUN  --   < >  --  16 18 18   CREATININE 0.98  < >  --  0.92 0.69 0.76  CALCIUM  --   < >  --  8.8* 9.0 8.9  MG 1.9  --  2.1  --   --   --   PHOS 3.7  --   --   --   --   --   TSH  --   --  2.277  --   --   --   < > = values in this interval not displayed. Liver Function Tests:  Recent Labs  04/03/15 1350 04/24/15 0609 04/27/15 1410  AST 18 30 24   ALT 7 6* 24  ALKPHOS 66 52 67  BILITOT 0.4 1.0 0.8  PROT 5.8* 5.3* 6.2*  ALBUMIN 3.8 2.9* 3.4*   No results for input(s): LIPASE, AMYLASE in the last 8760 hours. No results for input(s): AMMONIA in the last 8760 hours. CBC:  Recent Labs  04/23/15 1743  04/26/15 0545 04/27/15 1410 04/28/15 0534 07/06/15 1359  WBC 7.7  < > 7.5 10.0 7.8 7.9  NEUTROABS 5.6  --   --  7.6  --  5.8  HGB 13.0  < > 13.9 14.0 13.0  --   HCT 39.3  < > 41.9 42.7 40.0 36.8  MCV 84.7  < > 84.6 85.1 86.2 84  PLT 146*  < > 127* 147* 135* 187  < > = values in this interval not displayed. Lipid Panel: No results for input(s): CHOL, HDL, LDLCALC, TRIG, CHOLHDL, LDLDIRECT in the last 8760 hours. TSH:  Recent Labs  04/27/15 2112  TSH 2.277   A1C: No results found for: HGBA1C   Assessment/Plan 1. Diaphoresis Reports increase episodes of diaphoresis since she has been in rehab. Questioned dehydration.  No signs of dehydration on exam. Has increased water intake. BMP obtained yesterday which was normal and reviewed with pt. Pt to cont to drink 3 bottles of water daily No further episodes of increased pain.   2. Insomnia  Cont melatonin   Jessica K. Harle Battiest  [facility] [phone] 8 am - 5 pm) [phone] (after hours)

## 2015-12-08 ENCOUNTER — Ambulatory Visit: Payer: Self-pay

## 2015-12-08 ENCOUNTER — Other Ambulatory Visit: Payer: Self-pay

## 2015-12-08 NOTE — Patient Outreach (Signed)
[facility]) Care Management  12/08/2015  [name] 06-Mar-1941 [id number]   Telephone outreach to patient for monthly call.  No answer.  HIPAA compliant voice message left.    Plan: RN Health Coach will attempt patient within 1-2 weeks.    Jone Baseman, RN, MSN [place] [phone]

## 2015-12-15 ENCOUNTER — Other Ambulatory Visit: Payer: Self-pay

## 2015-12-15 NOTE — Patient Outreach (Signed)
[facility]) Care Management  [place]  12/15/2015   [name] 06/12/1941 [id number]  Subjective: Telephone call to patient for monthly call. Patient reports she is doing pretty good. Patient reports she receives meals on wheels Monday-Friday.  Patient reports that her caregiver/friend Jackelyn Poling is in the hospital.  Patient reports that she has plenty of help and that Jackelyn Poling will be getting out of the hospital today or tomorrow.  Patient reports she has been able to care for herself.  Patient denies any problems with swelling or shortness of breath.  Discussed with patient heart failure zone chart and when to notify physician.  She verbalized understanding.   Objective:   Current Medications:  Current Outpatient Prescriptions  Medication Sig Dispense Refill  . carbidopa-levodopa (SINEMET IR) 25-100 MG tablet Take 1 tablet by mouth 6 (six) times daily. Take at 6a, 9a, 12n, 3p, 6pm, 9pm 540 tablet 0  . Cholecalciferol (VITAMIN D) 2000 UNITS CAPS Take 1 capsule (2,000 Units total) by mouth daily. 30 capsule 3  . docusate sodium (COLACE) 100 MG capsule Take 100 mg by mouth daily.     . Ferrous Sulfate (IRON) 325 (65 FE) MG TABS Take 1 capsule by mouth daily. 30 each 6  . furosemide (LASIX) 40 MG tablet Take 40 mg by mouth daily. Reported on 11/13/2015    . latanoprost (XALATAN) 0.005 % ophthalmic solution Place 1 drop into the left eye daily at 6 PM.     . LORazepam (ATIVAN) 0.5 MG tablet Take 1 tablet (0.5 mg total) by mouth 2 (two) times daily as needed for anxiety. 30 tablet 0  . Melatonin 5 MG TABS Take one tablet by mouth one hour before bedtime for sleep 30 each 1  . Multiple Vitamins-Minerals (MULTIVITAMIN WITH MINERALS) tablet Take 1 tablet by mouth daily.    Marland Kitchen oxyCODONE (OXY IR/ROXICODONE) 5 MG immediate release tablet Take 1 tablet (5 mg total) by mouth 2 (two) times daily as needed for severe pain. 60 tablet 0  . Polyethyl Glycol-Propyl Glycol (SYSTANE OP)  Place 1 drop into the right eye daily at 6 PM.    . pramipexole (MIRAPEX) 1 MG tablet Take 1 tablet (1 mg total) by mouth 3 (three) times daily. 270 tablet 3  . [DISCONTINUED] bisoprolol (ZEBETA) 5 MG tablet Take 5 mg by mouth daily.       No current facility-administered medications for this visit.    Functional Status:  In your present state of health, do you have any difficulty performing the following activities: 11/13/2015 04/27/2015  Hearing? N N  Vision? N N  Difficulty concentrating or making decisions? N Y  Walking or climbing stairs? Y Y  Dressing or bathing? N N  Doing errands, shopping? [name]  Preparing Food and eating ? Y -  Using the Toilet? N -  In the past six months, have you accidently leaked urine? Y -  Do you have problems with loss of bowel control? N -  Managing your Medications? N -  Managing your Finances? N -  Housekeeping or managing your Housekeeping? Y -    Fall/Depression Screening: PHQ 2/9 Scores 12/15/2015 11/13/2015 11/02/2015 11/02/2015 08/25/2014 07/21/2014 08/02/2013  PHQ - 2 Score 0 0 0 0 0 0 0    Assessment: Patient continues to benefit from health coach outreach for disease management and support.    Plan:  [facility] CM Care Plan Problem One        Most Recent Value  Care Plan Problem One  Heart Failure Knowledge Deficit   Role Documenting the Problem One  [place] for Problem One  Active   THN Long Term Goal (31-90 days)  Patient will be able to identify heart failure zones within 90 days.    THN Long Term Goal Start Date  11/13/15   Interventions for Problem One Long Term Goal  RN Health Coach reviewed heart failure zone chart and purpose.    THN CM Short Term Goal #1 (0-30 days)  Patient will be able to verbalize symptoms of heart failure green zone within 30 days.   THN CM Short Term Goal #1 Start Date  12/15/15 [goal continued]   Interventions for Short Term Goal #1  RN Health Coach reviewed with patient about green zone of heart  failure chart.     THN CM Short Term Goal #2 (0-30 days)  Patient will be able to name three foods that are high in sodium within 30 days.   THN CM Short Term Goal #2 Start Date  11/13/15 [goal continued]   Interventions for Short Term Goal #2  [place] reviewed with patient how frozen/fresh/and canned foods are different in sodium content.    THN CM Care Plan Problem Two        Most Recent Value   Care Plan Problem Two  Risk for falls   Role Documenting the Problem Two  [place] for Problem Two  Active   Interventions for Problem Two Long Term Goal   RN Health Coach reviewed with patient fall prevention and importance of utilizing assistive devices.     THN Long Term Goal (31-90) days  Patient will have no falls within the next 60 days.    THN Long Term Goal Start Date  11/13/15 [facility] continued]     [place] will contact patient within one month and patient agrees to next outreach.    Jone Baseman, RN, MSN [place] [phone]

## 2015-12-29 ENCOUNTER — Ambulatory Visit (INDEPENDENT_AMBULATORY_CARE_PROVIDER_SITE_OTHER): Payer: Medicare Other | Admitting: Internal Medicine

## 2015-12-29 VITALS — BP 134/82 | HR 79 | Temp 98.0°F | Wt 176.0 lb

## 2015-12-29 DIAGNOSIS — R441 Visual hallucinations: Secondary | ICD-10-CM

## 2015-12-29 DIAGNOSIS — F411 Generalized anxiety disorder: Secondary | ICD-10-CM

## 2015-12-29 DIAGNOSIS — G20 Parkinson's disease: Secondary | ICD-10-CM

## 2015-12-29 DIAGNOSIS — I5042 Chronic combined systolic (congestive) and diastolic (congestive) heart failure: Secondary | ICD-10-CM

## 2015-12-29 DIAGNOSIS — I872 Venous insufficiency (chronic) (peripheral): Secondary | ICD-10-CM

## 2015-12-29 DIAGNOSIS — M545 Low back pain, unspecified: Secondary | ICD-10-CM

## 2015-12-29 MED ORDER — LORAZEPAM 0.5 MG PO TABS: 0.5000 mg | ORAL_TABLET | Freq: Two times a day (BID) | ORAL | Status: DC | PRN

## 2015-12-29 MED ORDER — OXYCODONE HCL 5 MG PO TABS: 5.0000 mg | ORAL_TABLET | Freq: Two times a day (BID) | ORAL | Status: DC | PRN

## 2015-12-29 NOTE — Patient Instructions (Addendum)
Increase lasix (furosemide) 40mg  to twice daily (first thing in am and at noon) for 3 days.   Wear your compression hose during the day and take them off at bedtime.

## 2015-12-29 NOTE — Progress Notes (Signed)
Patient ID: [name], female   DOB: 1941/06/07, 75 y.o.   MRN: [id number]   Location:  [facility] clinic Provider: [name] Pickney L. Mariea [name], D.O., C.M.D.  Code Status: DNR Goals of Care:  Advanced Directives 11/21/2015  Does patient have an advance directive? Yes  Type of Advance Directive Out of facility DNR (pink MOST or yellow form)  Does patient want to make changes to advanced directive? No - Patient declined  Copy of advanced directive(s) in chart? Yes  Pre-existing out of facility DNR order (yellow form or pink MOST form) Pink MOST form placed in chart (order not valid for inpatient use)     Chief Complaint  Patient presents with  . Acute Visit    Complains of legs swelling. [name], Caregiver with patient    [name] seen today for an acute visit for leg swelling.  She is up 3 lbs over 6 wks.  Is taking her furosemide 40mg  daily.  No increase in salt.  Does admit to some potato chips (says very few).  Says she is sometimes short of breath just talking on the phone.  Weight is up, but also eating better since having caregiver at home.  Needed her oxycodone and ativan renewed today.    Has been seeing little kids.  This is a new problem.  She is not frightened by them--they are always pleasant.  It's just perplexing to her caregiver.  Past Medical History  Diagnosis Date  . Parkinson's disease   . Hypertension   . Arthritis   . Anemia   . Cellulitis of lower leg 12/24/2011  . Chronic systolic heart failure ([place])   . Acute on chronic systolic heart failure ([place])   . Cellulitis and abscess of leg, except foot   . Hypotension, unspecified   . Helicobacter pylori (H. pylori)   . CHF (congestive heart failure) ([place])   . Anxiety   . Cataract left  . Glaucoma left  . GERD (gastroesophageal reflux disease)   . Hypercholesterolemia   . Chronic bronchitis ([place])     "usually get it q yr"  . Sleep apnea     "had test years ago; insurance wouldn't pay for mask so I  never had one" (04/27/2015)  . Hypothyroidism   . History of blood transfusion     "don't remember why"  . History of bleeding peptic ulcer   . Stroke [facility]) X 3    "that  was the reason I got Parkinson's"    Past Surgical History  Procedure Laterality Date  . Hemiarthroplasty hip Right 09/24/2007    [facility] Endo 01/23/2011  . Thyroidectomy    . Esophagogastroduodenoscopy  09/26/2011    Procedure: ESOPHAGOGASTRODUODENOSCOPY (EGD);  Surgeon: Zenovia Jarred, MD;  Location: [facility] ENDOSCOPY;  Service: Gastroenterology;  Laterality: N/A;  To be done at bedside.  . Esophagogastroduodenoscopy N/A 09/30/2013    Procedure: ESOPHAGOGASTRODUODENOSCOPY (EGD);  Surgeon: [name], MD;  Location: [place];  Service: Endoscopy;  Laterality: N/A;  . Esophagogastroduodenoscopy N/A 11/25/2013    Procedure: ESOPHAGOGASTRODUODENOSCOPY (EGD);  Surgeon: [name], MD;  Location: [facility];  Service: Endoscopy;  Laterality: N/A;  . Tonsillectomy    . Nissen fundoplication      "had OR for GERD"  . Tendon repair      Gluteus medius [facility] 01/23/2011  . Back surgery    . Kyphoplasty  06/2009    T12/notes 07/17/2009    Allergies  Allergen Reactions  . Codeine Other (  See Comments)    Unknown allergic reaction  . Penicillins Other (See Comments)    Pt passed out in doctor's office after penicillin dose      Medication List       This list is accurate as of: 12/29/15  9:52 AM.  Always use your most recent med list.               carbidopa-levodopa 25-100 MG tablet  Commonly known as:  SINEMET IR  Take 1 tablet by mouth 6 (six) times daily. Take at 6a, 9a, 12n, 3p, 6pm, 9pm     docusate sodium 100 MG capsule  Commonly known as:  COLACE  Take 100 mg by mouth daily.     furosemide 40 MG tablet  Commonly known as:  LASIX  Take 40 mg by mouth daily. Reported on 11/13/2015     Iron 325 (65 Fe) MG Tabs  Take 1 capsule by mouth daily.     latanoprost 0.005 % ophthalmic solution  Commonly known as:   XALATAN  Place 1 drop into the left eye daily at 6 PM.     LORazepam 0.5 MG tablet  Commonly known as:  ATIVAN  Take 1 tablet (0.5 mg total) by mouth 2 (two) times daily as needed for anxiety.     Melatonin 5 MG Tabs  Take one tablet by mouth one hour before bedtime for sleep     multivitamin with minerals tablet  Take 1 tablet by mouth daily.     oxyCODONE 5 MG immediate release tablet  Commonly known as:  Oxy IR/ROXICODONE  Take 1 tablet (5 mg total) by mouth 2 (two) times daily as needed for severe pain.     pramipexole 1 MG tablet  Commonly known as:  MIRAPEX  Take 1 tablet (1 mg total) by mouth 3 (three) times daily.     SYSTANE OP  Place 1 drop into the right eye daily at 6 PM.     Vitamin D 2000 units Caps  Take 1 capsule (2,000 Units total) by mouth daily.        Review of Systems:  Review of Systems  Constitutional: Positive for malaise/fatigue. Negative for fever and chills.  HENT: Negative for congestion and hearing loss.   Eyes: Negative for blurred vision.  Respiratory: Negative for shortness of breath.   Cardiovascular: Positive for leg swelling. Negative for chest pain and palpitations.  Gastrointestinal: Negative for abdominal pain, constipation, blood in stool and melena.  Genitourinary: Negative for dysuria, urgency and frequency.  Musculoskeletal: Negative for falls.  Skin: Negative for itching and rash.  Neurological: Positive for tremors, sensory change and weakness.  Endo/Heme/Allergies: Does not bruise/bleed easily.  Psychiatric/Behavioral: Positive for depression and hallucinations. Negative for memory loss.    Health Maintenance  Topic Date Due  . TETANUS/TDAP  06/07/1959  . COLONOSCOPY  06/06/1990  . ZOSTAVAX  06/06/2000  . DEXA SCAN  06/06/2005  . INFLUENZA VACCINE  04/23/2016  . PNA vac Low Risk Adult  Completed    Physical Exam: Filed Vitals:   12/29/15 0934  BP: 134/82  Pulse: 79  Temp: 98 F (36.7 C)  TempSrc: Oral    Weight: 176 lb (79.833 kg)   Body mass index is 30.2 kg/(m^2). Physical Exam  Neck: Neck supple. No JVD present.  Cardiovascular: Normal rate, regular rhythm, normal heart sounds and intact distal pulses.   Pulmonary/Chest: Effort normal and breath sounds normal. She has no rales.  Abdominal: Soft. Bowel sounds  are normal.  Skin: Skin is warm and dry. There is erythema.  Chronic venous insufficiency of lower extremities, but edema is increased from where it has been lately    Labs reviewed: Basic Metabolic Panel:  Recent Labs  04/23/15 2025  04/27/15 2112 04/28/15 0534 07/06/15 1359 11/20/15 1451  NA  --   < >  --  140 142 139  K  --   < >  --  3.8 4.2 4.5  CL  --   < >  --  103 106 100  CO2  --   < >  --  27 22 24   GLUCOSE  --   < >  --  92 105* 104*  BUN  --   < >  --  16 18 18   CREATININE 0.98  < >  --  0.92 0.69 0.76  CALCIUM  --   < >  --  8.8* 9.0 8.9  MG 1.9  --  2.1  --   --   --   PHOS 3.7  --   --   --   --   --   TSH  --   --  2.277  --   --   --   < > = values in this interval not displayed. Liver Function Tests:  Recent Labs  04/03/15 1350 04/24/15 0609 04/27/15 1410  AST 18 30 24   ALT 7 6* 24  ALKPHOS 66 52 67  BILITOT 0.4 1.0 0.8  PROT 5.8* 5.3* 6.2*  ALBUMIN 3.8 2.9* 3.4*   No results for input(s): LIPASE, AMYLASE in the last 8760 hours. No results for input(s): AMMONIA in the last 8760 hours. CBC:  Recent Labs  04/23/15 1743  04/26/15 0545 04/27/15 1410 04/28/15 0534 07/06/15 1359  WBC 7.7  < > 7.5 10.0 7.8 7.9  NEUTROABS 5.6  --   --  7.6  --  5.8  HGB 13.0  < > 13.9 14.0 13.0  --   HCT 39.3  < > 41.9 42.7 40.0 36.8  MCV 84.7  < > 84.6 85.1 86.2 84  PLT 146*  < > 127* 147* 135* 187  < > = values in this interval not displayed.  Assessment/Plan 1. Parkinson's disease ([place]) -continues on her sinemet IR 6x perday and mirapex tid for this -is quite advanced, has freezing spells and now having hallucinations related to this  2.  Anxiety state - has done much better with her mood since ativan was started - LORazepam (ATIVAN) 0.5 MG tablet; Take 1 tablet (0.5 mg total) by mouth 2 (two) times daily as needed for anxiety.  Dispense: 60 tablet; Refill: 0  3. Midline low back pain without sciatica - chronic and severe, but she does not tolerate large amounts of pain medication due to sedation so she will only use the percocet twice a day max - oxyCODONE (OXY IR/ROXICODONE) 5 MG immediate release tablet; Take 1 tablet (5 mg total) by mouth 2 (two) times daily as needed for severe pain.  Dispense: 60 tablet; Refill: 0  4. Chronic venous insufficiency - worse right now -having some acute on chronic chf so will increase lasix to bid for 3 days - Basic metabolic panel today--if potassium is low, will also give potassium tablets, but if normal, no potassium needed for short course  5. Chronic combined systolic and diastolic congestive heart failure (HCC) - acute on chronic today -increase lasix to bid for three days due to increased edema, weight  gain - caregiver to monitor weight, avoid sodium in diet like potato chips and frozen foods and soups -they are to call me if edema is not improving and weight is not trending down -also to use compression hose in the daytime as directed - Basic metabolic panel  6. Visual hallucinations -new -seems these are either due to the sinemet or due to here PD itself -meds have been reduced by neurology I think in an attempt to determine if they are the cause of the hallucinations, but there has not been any change in the hallucination frequency per [name] -hallucinations do not bother [name] so I would not treat them unless they become frightening or affect her behavior in which case nuplazid would be considered  Labs/tests ordered:   Orders Placed This Encounter  Procedures  . Basic metabolic panel   Next appt:  03/29/2016 for med mgt  [name], D.O. [facility] [address], [place] [address] Cell Phone (Mon-Fri 8am-5pm):  [phone] On Call:  [phone] & follow prompts after 5pm & weekends Office Phone:  [phone] Office Fax:  [phone]

## 2015-12-30 LAB — BASIC METABOLIC PANEL
BUN/Creatinine Ratio: 20 (ref 12–28)
BUN: 17 mg/dL (ref 8–27)
CO2: 23 mmol/L (ref 18–29)
Calcium: 9 mg/dL (ref 8.7–10.3)
Chloride: 101 mmol/L (ref 96–106)
Creatinine, Ser: 0.85 mg/dL (ref 0.57–1.00)
GFR calc Af Amer: 78 mL/min/{1.73_m2} (ref >59)
GFR calc non Af Amer: 67 mL/min/{1.73_m2} (ref >59)
Glucose: 77 mg/dL (ref 65–99)
Potassium: 4.1 mmol/L (ref 3.5–5.2)
Sodium: 141 mmol/L (ref 134–144)

## 2016-01-04 ENCOUNTER — Ambulatory Visit: Payer: Medicare Other | Admitting: Internal Medicine

## 2016-01-09 ENCOUNTER — Ambulatory Visit: Payer: Self-pay

## 2016-01-09 ENCOUNTER — Other Ambulatory Visit: Payer: Self-pay

## 2016-01-09 DIAGNOSIS — H2513 Age-related nuclear cataract, bilateral: Secondary | ICD-10-CM | POA: Diagnosis not present

## 2016-01-09 DIAGNOSIS — H18413 Arcus senilis, bilateral: Secondary | ICD-10-CM | POA: Diagnosis not present

## 2016-01-09 DIAGNOSIS — H25043 Posterior subcapsular polar age-related cataract, bilateral: Secondary | ICD-10-CM | POA: Diagnosis not present

## 2016-01-09 DIAGNOSIS — H11153 Pinguecula, bilateral: Secondary | ICD-10-CM | POA: Diagnosis not present

## 2016-01-09 DIAGNOSIS — H04123 Dry eye syndrome of bilateral lacrimal glands: Secondary | ICD-10-CM | POA: Diagnosis not present

## 2016-01-09 DIAGNOSIS — H401121 Primary open-angle glaucoma, left eye, mild stage: Secondary | ICD-10-CM | POA: Diagnosis not present

## 2016-01-09 DIAGNOSIS — H11423 Conjunctival edema, bilateral: Secondary | ICD-10-CM | POA: Diagnosis not present

## 2016-01-09 NOTE — Patient Outreach (Signed)
[place] [facility]) Care Management  01/09/2016  SOLAY STADTMILLER 16-Nov-1940 [id number]   Telephone call to patient for monthly call.  No answer.  HIPAA compliant voice message left.    Plan: RN Health Coach will contact patient within 1-2 weeks.    Jone Baseman, RN, MSN [place] [phone]

## 2016-01-10 ENCOUNTER — Encounter: Payer: Self-pay | Admitting: Internal Medicine

## 2016-01-15 ENCOUNTER — Telehealth: Payer: Self-pay | Admitting: Neurology

## 2016-01-15 DIAGNOSIS — G20 Parkinson's disease: Secondary | ICD-10-CM

## 2016-01-15 MED ORDER — CARBIDOPA-LEVODOPA 25-100 MG PO TABS: ORAL_TABLET | ORAL | Status: DC

## 2016-01-15 NOTE — Telephone Encounter (Signed)
Please advise patient that we could try to increase her carbidopa-levodopa to 1 pill alternating with 1-1/2 pills as follows: Take 1 at 6a, 1 1/2 pills at 9a, 1 at noon, 1 1/2 pills at 3p, 1 at 6pm, 1 1/2 pills at 9pm. I suggested the prescription. We reduced her Mirapex to 1 mg pills last time, take 1 pill 3 times a day, please confirm.  Please ask her to keep her appointment next month.
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
## 2016-01-15 NOTE — Telephone Encounter (Signed)
Patient is calling and states that last month she had a seizure and cannot move her feet or legs. She is requesting a call from Dr. Rexene Alberts. (Patient was very hard to hear and stated she is having phone problems)

## 2016-01-15 NOTE — Telephone Encounter (Signed)
Called and no answer and no vm, will try back again soon.

## 2016-01-16 ENCOUNTER — Other Ambulatory Visit: Payer: Self-pay

## 2016-01-16 ENCOUNTER — Other Ambulatory Visit: Payer: Self-pay | Admitting: Licensed Clinical Social Worker

## 2016-01-16 DIAGNOSIS — I509 Heart failure, unspecified: Secondary | ICD-10-CM

## 2016-01-16 DIAGNOSIS — G20 Parkinson's disease: Secondary | ICD-10-CM
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
## 2016-01-16 NOTE — Telephone Encounter (Signed)
I spoke to patient and gave recommendations below. She voiced understanding. She is taking Mirapex 3 times a day. She will call back with any further problems.

## 2016-01-16 NOTE — Patient Outreach (Signed)
[place] [facility]) Care Management  01/16/2016  [name] 1941/08/15 [id number]   Assessment-CSW received referral from [facility]. Patient's no longer is using caregiver than she use to have and is in need of resource information on caregiver programs. CSW completed outreach to patient and was able to successfully reach her. HIPPA verifications provided. CSW introduced self and reason for call. CSW educated patient on how currently Medicare and commercial insurances do not pay for personal care services. Patient does not have Medicaid. CSW educated patient on the [employer] and [employer] Program with [place] with [place]. Patient wishes to be put on both waiting list. Patient is aware that the current wait time for [facility] and Response Program is 8 months and the In [place] is up to 1 year and 6 months (free service). CSW informed her that she can mail her out personal care resources which will include information on available agencies within [facility]. Patient is aware that this is private pay. Patient wishes to gain resource information. CSW will mail information to residence and will put patient on both waiting list.   Plan-CSW will update RNCM and discharge patient from case load.  [name], BSW, MSW, [email or web address] Phone: [phone] Fax: [phone]

## 2016-01-18 NOTE — Patient Outreach (Signed)
[place] [facility]) Care Management  01/18/2016  [name] May 23, 1941 [id number]    Request received from [name], LCSW to mail patient personal care resources. Information mailed today.

## 2016-01-19 ENCOUNTER — Emergency Department (HOSPITAL_COMMUNITY)
Admission: EM | Admit: 2016-01-19 | Discharge: 2016-01-20 | Disposition: A | Discharge status: Home / Self Care | Payer: Medicare Other | Attending: Emergency Medicine | Admitting: Emergency Medicine

## 2016-01-19 ENCOUNTER — Encounter (HOSPITAL_COMMUNITY): Payer: Self-pay | Admitting: Emergency Medicine

## 2016-01-19 DIAGNOSIS — I5023 Acute on chronic systolic (congestive) heart failure: Secondary | ICD-10-CM | POA: Insufficient documentation

## 2016-01-19 DIAGNOSIS — Z8619 Personal history of other infectious and parasitic diseases: Secondary | ICD-10-CM | POA: Diagnosis not present

## 2016-01-19 DIAGNOSIS — I10 Essential (primary) hypertension: Secondary | ICD-10-CM | POA: Insufficient documentation

## 2016-01-19 DIAGNOSIS — Z8711 Personal history of peptic ulcer disease: Secondary | ICD-10-CM | POA: Insufficient documentation

## 2016-01-19 DIAGNOSIS — F419 Anxiety disorder, unspecified: Secondary | ICD-10-CM | POA: Diagnosis not present

## 2016-01-19 DIAGNOSIS — Z8719 Personal history of other diseases of the digestive system: Secondary | ICD-10-CM | POA: Insufficient documentation

## 2016-01-19 DIAGNOSIS — L03115 Cellulitis of right lower limb: Secondary | ICD-10-CM | POA: Insufficient documentation

## 2016-01-19 DIAGNOSIS — L03116 Cellulitis of left lower limb: Secondary | ICD-10-CM | POA: Insufficient documentation

## 2016-01-19 DIAGNOSIS — Z8709 Personal history of other diseases of the respiratory system: Secondary | ICD-10-CM | POA: Insufficient documentation

## 2016-01-19 DIAGNOSIS — Z880 Allergy status to penicillin: Secondary | ICD-10-CM | POA: Insufficient documentation

## 2016-01-19 DIAGNOSIS — L03119 Cellulitis of unspecified part of limb: Secondary | ICD-10-CM

## 2016-01-19 DIAGNOSIS — Z8639 Personal history of other endocrine, nutritional and metabolic disease: Secondary | ICD-10-CM | POA: Insufficient documentation

## 2016-01-19 DIAGNOSIS — R52 Pain, unspecified: Secondary | ICD-10-CM

## 2016-01-19 DIAGNOSIS — M79604 Pain in right leg: Secondary | ICD-10-CM | POA: Diagnosis not present

## 2016-01-19 DIAGNOSIS — G20 Parkinson's disease: Secondary | ICD-10-CM | POA: Insufficient documentation

## 2016-01-19 DIAGNOSIS — Z79899 Other long term (current) drug therapy: Secondary | ICD-10-CM | POA: Insufficient documentation

## 2016-01-19 DIAGNOSIS — Z8673 Personal history of transient ischemic attack (TIA), and cerebral infarction without residual deficits: Secondary | ICD-10-CM | POA: Insufficient documentation

## 2016-01-19 DIAGNOSIS — Z8739 Personal history of other diseases of the musculoskeletal system and connective tissue: Secondary | ICD-10-CM | POA: Insufficient documentation

## 2016-01-19 DIAGNOSIS — H409 Unspecified glaucoma: Secondary | ICD-10-CM | POA: Diagnosis not present

## 2016-01-19 DIAGNOSIS — M79671 Pain in right foot: Secondary | ICD-10-CM | POA: Diagnosis present

## 2016-01-19 DIAGNOSIS — D649 Anemia, unspecified: Secondary | ICD-10-CM | POA: Diagnosis not present

## 2016-01-19 DIAGNOSIS — H269 Unspecified cataract: Secondary | ICD-10-CM | POA: Insufficient documentation

## 2016-01-19 DIAGNOSIS — M79661 Pain in right lower leg: Secondary | ICD-10-CM | POA: Diagnosis not present

## 2016-01-19 DIAGNOSIS — M79673 Pain in unspecified foot: Secondary | ICD-10-CM | POA: Diagnosis not present

## 2016-01-19 NOTE — ED Notes (Signed)
Pt is alert X 4. Brought in [facility] after waking up with severe pain in both feet. Pt has had cramp in feet previously, relieved with standing position. This was ineffective today. Pt presents with weakness. Lives alone. Hx of Parkinson and HTN. Legs red in color, warm to touch.

## 2016-01-19 NOTE — ED Provider Notes (Signed)
CSN: [id number]     Arrival date & time 01/19/16  2326 History   By signing my name below, I, [name], attest that this documentation has been prepared under the direction and in the presence of Varney Biles, MD. Electronically Signed: [name], ED Scribe. 01/20/2016. 12:16 AM.    Chief Complaint  Patient presents with  . Foot Pain   The history is provided by the patient. No language interpreter was used.   HPI Comments: [name] is a 75 y.o. female brought in by ambulance, who presents to the Emergency Department complaining of bilateral foot pain onset tonight at 10 PM. She states that the right leg is worse than the right. Pt reports that the pain radiates up into her lower legs. Pt states she has associated redness. Pt states that she does have a Hx of cellulitis. Pt denies fever or chills.    Past Medical History  Diagnosis Date  . Parkinson's disease   . Hypertension   . Arthritis   . Anemia   . Cellulitis of lower leg 12/24/2011  . Chronic systolic heart failure ([place])   . Acute on chronic systolic heart failure ([place])   . Cellulitis and abscess of leg, except foot   . Hypotension, unspecified   . Helicobacter pylori (H. pylori)   . CHF (congestive heart failure) ([place])   . Anxiety   . Cataract left  . Glaucoma left  . GERD (gastroesophageal reflux disease)   . Hypercholesterolemia   . Chronic bronchitis ([place])     "usually get it q yr"  . Sleep apnea     "had test years ago; insurance wouldn't pay for mask so I never had one" (04/27/2015)  . Hypothyroidism   . History of blood transfusion     "don't remember why"  . History of bleeding peptic ulcer   . Stroke [facility]) X 3    "that  was the reason I got Parkinson's"   Past Surgical History  Procedure Laterality Date  . Hemiarthroplasty hip Right 09/24/2007    Archie Endo 01/23/2011  . Thyroidectomy    . Esophagogastroduodenoscopy  09/26/2011    Procedure: ESOPHAGOGASTRODUODENOSCOPY (EGD);  Surgeon: Zenovia Jarred,  MD;  Location: [facility] ENDOSCOPY;  Service: Gastroenterology;  Laterality: N/A;  To be done at bedside.  . Esophagogastroduodenoscopy N/A 09/30/2013    Procedure: ESOPHAGOGASTRODUODENOSCOPY (EGD);  Surgeon: [name], MD;  Location: [facility];  Service: Endoscopy;  Laterality: N/A;  . Esophagogastroduodenoscopy N/A 11/25/2013    Procedure: ESOPHAGOGASTRODUODENOSCOPY (EGD);  Surgeon: [name], MD;  Location: [facility] ENDOSCOPY;  Service: Endoscopy;  Laterality: N/A;  . Tonsillectomy    . Nissen fundoplication      "had OR for GERD"  . Tendon repair      Gluteus medius Archie Endo 01/23/2011  . Back surgery    . Kyphoplasty  06/2009    T12/notes 07/17/2009   Family History  Problem Relation Age of Onset  . GER disease Mother   . Heart disease Father    Social History  Substance Use Topics  . Smoking status: Never Smoker   . Smokeless tobacco: Never Used  . Alcohol Use: No   OB History    No data available     Review of Systems A complete 10 system review of systems was obtained and all systems are negative except as noted in the HPI and PMH.     Allergies  Codeine and Penicillins  Home Medications   Prior to Admission medications  Medication Sig Start Date End Date Taking? Authorizing Provider  carbidopa-levodopa (SINEMET IR) 25-100 MG tablet Take 1 at 6a, 1 1/2 pills at [place], 1 at noon, 1 1/2 pills at 3p, 1 at 6pm, 1 1/2 pills at 9pm 01/15/16   Star Age, MD  cephALEXin (KEFLEX) 500 MG capsule Take 1 capsule (500 mg total) by mouth 4 (four) times daily. 01/20/16   Varney Biles, MD  Cholecalciferol (VITAMIN D) 2000 UNITS CAPS Take 1 capsule (2,000 Units total) by mouth daily. 07/06/15   [name] L Reed, DO  docusate sodium (COLACE) 100 MG capsule Take 100 mg by mouth daily.     Historical Provider, MD  Ferrous Sulfate (IRON) 325 (65 FE) MG TABS Take 1 capsule by mouth daily. 12/16/14   Lavon Paganini Angiulli, PA-C  furosemide (LASIX) 40 MG tablet Take 40 mg by mouth daily. Reported on  11/13/2015    Historical Provider, MD  latanoprost (XALATAN) 0.005 % ophthalmic solution Place 1 drop into the left eye daily at 6 PM.  02/23/15   Historical Provider, MD  LORazepam (ATIVAN) 0.5 MG tablet Take 1 tablet (0.5 mg total) by mouth 2 (two) times daily as needed for anxiety. 12/29/15   [name] L Reed, DO  Melatonin 5 MG TABS Take one tablet by mouth one hour before bedtime for sleep 08/10/15   Gayland Curry, DO  Multiple Vitamins-Minerals (MULTIVITAMIN WITH MINERALS) tablet Take 1 tablet by mouth daily.    Historical Provider, MD  oxyCODONE (OXY IR/ROXICODONE) 5 MG immediate release tablet Take 1 tablet (5 mg total) by mouth 2 (two) times daily as needed for severe pain. 12/29/15   [name] L Reed, DO  Polyethyl Glycol-Propyl Glycol (SYSTANE OP) Place 1 drop into the right eye daily at 6 PM.    Historical Provider, MD  pramipexole (MIRAPEX) 1 MG tablet Take 1 tablet (1 mg total) by mouth 3 (three) times daily. 10/10/15   Star Age, MD  sulfamethoxazole-trimethoprim (BACTRIM DS,SEPTRA DS) 800-160 MG tablet Take 1 tablet by mouth 2 (two) times daily. 01/20/16   Natasja Niday, MD   BP 155/74 mmHg  Pulse 73  Temp(Src) 97.7 F (36.5 C) (Oral)  Resp 18  SpO2 97%   Physical Exam  Constitutional: She is oriented to person, place, and time. She appears well-developed and well-nourished.  HENT:  Head: Normocephalic.  Eyes: EOM are normal.  Neck: Normal range of motion.  Pulmonary/Chest: Effort normal.  Abdominal: She exhibits no distension.  Musculoskeletal: Normal range of motion.  Bilateral lower extremity erythema with  chlor right extremity worse with an left there is pitting edema.   Neurological: She is alert and oriented to person, place, and time.  Psychiatric: She has a normal mood and affect.  Nursing note and vitals reviewed.   ED Course  Procedures (including critical care time) DIAGNOSTIC STUDIES: Oxygen Saturation is 97% on RA, normal by my interpretation.     COORDINATION OF CARE: 12:15 AM-Discussed treatment plan with pt at bedside and pt agreed to plan.     Labs Review Labs Reviewed  CBC WITH DIFFERENTIAL/PLATELET - Abnormal; Notable for the following:    Hemoglobin 10.2 (*)    HCT 33.3 (*)    MCH 24.8 (*)    RDW 17.4 (*)    Platelets 147 (*)    All other components within normal limits  BASIC METABOLIC PANEL - Abnormal; Notable for the following:    Chloride 114 (*)    Glucose, Bld 103 (*)  Calcium 8.6 (*)    All other components within normal limits  C-REACTIVE PROTEIN    Imaging Review Dg Tibia/fibula Right  01/20/2016  CLINICAL DATA:  Acute onset of right leg pain and weakness. Initial encounter. EXAM: RIGHT TIBIA AND FIBULA - 2 VIEW COMPARISON:  None. FINDINGS: There is no evidence of fracture or dislocation. The tibia and fibula appear intact. Visualized joint spaces are preserved. No knee joint effusion is seen. The ankle mortise is incompletely assessed, but appears grossly unremarkable. Mild anterior soft tissue swelling is suggested. IMPRESSION: No evidence of fracture or dislocation. Electronically Signed   By: Garald Balding M.D.   On: 01/20/2016 01:42      EKG Interpretation None      MDM   Final diagnoses:  Pain  Cellulitis of lower extremity, unspecified laterality    I personally performed the services described in this documentation, which was scribed in my presence. The recorded information has been reviewed and is accurate.  Pt comes in with cc of leg pain. She has leg swelling with redness, R worse than L. Also the legs are warm to touch and painful. She hasmost liikely has venous stasis with superimposed cellulitis. Pt reports that symptoms got worse quickly, so xray leg done, and there is no gas. Labs reassuring. Plan to d/c with antibiotics and close pcp f/u. Pt has ambulated. Lives by herself, and comfortable with the plan. Brother lives nearby and helps. Strict ER return precautions have been  discussed, and patient is agreeing with the plan and is comfortable with the workup done and the recommendations from the ER.      Varney Biles, MD 01/20/16 0530

## 2016-01-20 ENCOUNTER — Emergency Department (HOSPITAL_COMMUNITY): Payer: Medicare Other

## 2016-01-20 DIAGNOSIS — L03115 Cellulitis of right lower limb: Secondary | ICD-10-CM | POA: Diagnosis not present

## 2016-01-20 DIAGNOSIS — M79604 Pain in right leg: Secondary | ICD-10-CM | POA: Diagnosis not present

## 2016-01-20 LAB — CBC WITH DIFFERENTIAL/PLATELET
BASOS PCT: 1 %
Basophils Absolute: 0.1 10*3/uL (ref 0.0–0.1)
Eosinophils Absolute: 0.3 10*3/uL (ref 0.0–0.7)
Eosinophils Relative: 3 %
HEMATOCRIT: 33.3 % — AB (ref 36.0–46.0)
Hemoglobin: 10.2 g/dL — ABNORMAL LOW (ref 12.0–15.0)
Lymphocytes Relative: 17 %
Lymphs Abs: 1.3 10*3/uL (ref 0.7–4.0)
MCH: 24.8 pg — ABNORMAL LOW (ref 26.0–34.0)
MCHC: 30.6 g/dL (ref 30.0–36.0)
MCV: 80.8 fL (ref 78.0–100.0)
MONO ABS: 0.5 10*3/uL (ref 0.1–1.0)
MONOS PCT: 6 %
NEUTROS ABS: 6 10*3/uL (ref 1.7–7.7)
Neutrophils Relative %: 73 %
Platelets: 147 10*3/uL — ABNORMAL LOW (ref 150–400)
RBC: 4.12 MIL/uL (ref 3.87–5.11)
RDW: 17.4 % — AB (ref 11.5–15.5)
WBC: 8.1 10*3/uL (ref 4.0–10.5)

## 2016-01-20 LAB — BASIC METABOLIC PANEL
Anion gap: 9 (ref 5–15)
BUN: 19 mg/dL (ref 6–20)
CALCIUM: 8.6 mg/dL — AB (ref 8.9–10.3)
CO2: 22 mmol/L (ref 22–32)
CREATININE: 0.86 mg/dL (ref 0.44–1.00)
Chloride: 114 mmol/L — ABNORMAL HIGH (ref 101–111)
GFR calc non Af Amer: >60 mL/min (ref >60)
GLUCOSE: 103 mg/dL — AB (ref 65–99)
Potassium: 4.6 mmol/L (ref 3.5–5.1)
Sodium: 145 mmol/L (ref 135–145)

## 2016-01-20 LAB — C-REACTIVE PROTEIN: CRP: <0.5 mg/dL (ref <1.0)

## 2016-01-20 MED ORDER — SULFAMETHOXAZOLE-TRIMETHOPRIM 800-160 MG PO TABS
1.0000 | ORAL_TABLET | Freq: Once | ORAL | Status: AC
  Administered 2016-01-20: 1 via ORAL
  Filled 2016-01-20: qty 1

## 2016-01-20 MED ORDER — SULFAMETHOXAZOLE-TRIMETHOPRIM 800-160 MG PO TABS: 1.0000 | ORAL_TABLET | Freq: Two times a day (BID) | ORAL | Status: DC

## 2016-01-20 MED ORDER — CEPHALEXIN 500 MG PO CAPS: 500.0000 mg | ORAL_CAPSULE | Freq: Four times a day (QID) | ORAL | Status: DC

## 2016-01-20 MED ORDER — CEPHALEXIN 250 MG PO CAPS
500.0000 mg | ORAL_CAPSULE | Freq: Once | ORAL | Status: AC
  Administered 2016-01-20: 500 mg via ORAL
  Filled 2016-01-20: qty 2

## 2016-01-20 NOTE — Discharge Instructions (Signed)
Please take the meds as prescribed for what appears to be cellulitis of the leg. Please return to the ER if your symptoms worsen; you have increased pain, fevers, chills, inability to keep any medications down, confusion. Otherwise see the outpatient doctor as requested.    Cellulitis Cellulitis is an infection of the skin and the tissue beneath it. The infected area is usually red and tender. Cellulitis occurs most often in the arms and lower legs.  CAUSES  Cellulitis is caused by bacteria that enter the skin through cracks or cuts in the skin. The most common types of bacteria that cause cellulitis are staphylococci and streptococci. SIGNS AND SYMPTOMS   Redness and warmth.  Swelling.  Tenderness or pain.  Fever. DIAGNOSIS  Your health care provider can usually determine what is wrong based on a physical exam. Blood tests may also be done. TREATMENT  Treatment usually involves taking an antibiotic medicine. HOME CARE INSTRUCTIONS   Take your antibiotic medicine as directed by your health care provider. Finish the antibiotic even if you start to feel better.  Keep the infected arm or leg elevated to reduce swelling.  Apply a warm cloth to the affected area up to 4 times per day to relieve pain.  Take medicines only as directed by your health care provider.  Keep all follow-up visits as directed by your health care provider. SEEK MEDICAL CARE IF:   You notice red streaks coming from the infected area.  Your red area gets larger or turns dark in color.  Your bone or joint underneath the infected area becomes painful after the skin has healed.  Your infection returns in the same area or another area.  You notice a swollen bump in the infected area.  You develop new symptoms.  You have a fever. SEEK IMMEDIATE MEDICAL CARE IF:   You feel very sleepy.  You develop vomiting or diarrhea.  You have a general ill feeling (malaise) with muscle aches and pains.   This  information is not intended to replace advice given to you by your health care provider. Make sure you discuss any questions you have with your health care provider.   Document Released: 06/19/2005 Document Revised: 05/31/2015 Document Reviewed: 11/25/2011 Elsevier Interactive Patient Education [employer].

## 2016-01-20 NOTE — ED Notes (Signed)
Pt awaiting transport from brother [name].

## 2016-01-26 ENCOUNTER — Other Ambulatory Visit: Payer: Self-pay

## 2016-01-26 ENCOUNTER — Other Ambulatory Visit: Payer: Self-pay | Admitting: Licensed Clinical Social Worker

## 2016-01-26 ENCOUNTER — Ambulatory Visit (INDEPENDENT_AMBULATORY_CARE_PROVIDER_SITE_OTHER): Payer: Medicare Other | Admitting: Internal Medicine

## 2016-01-26 ENCOUNTER — Encounter: Payer: Self-pay | Admitting: Internal Medicine

## 2016-01-26 VITALS — BP 120/80 | HR 81 | Temp 97.6°F | Ht 64.0 in | Wt 168.0 lb

## 2016-01-26 DIAGNOSIS — L03115 Cellulitis of right lower limb: Secondary | ICD-10-CM

## 2016-01-26 DIAGNOSIS — I5042 Chronic combined systolic (congestive) and diastolic (congestive) heart failure: Secondary | ICD-10-CM

## 2016-01-26 DIAGNOSIS — I10 Essential (primary) hypertension: Secondary | ICD-10-CM

## 2016-01-26 DIAGNOSIS — M545 Low back pain, unspecified: Secondary | ICD-10-CM

## 2016-01-26 DIAGNOSIS — R441 Visual hallucinations: Secondary | ICD-10-CM | POA: Diagnosis not present

## 2016-01-26 DIAGNOSIS — G20A1 Parkinson's disease without dyskinesia, without mention of fluctuations: Secondary | ICD-10-CM

## 2016-01-26 DIAGNOSIS — I872 Venous insufficiency (chronic) (peripheral): Secondary | ICD-10-CM | POA: Diagnosis not present

## 2016-01-26 DIAGNOSIS — G20 Parkinson's disease: Secondary | ICD-10-CM

## 2016-01-26 NOTE — Patient Outreach (Addendum)
[facility]) Care Management  01/26/2016  GLOSSIE [name] 04-17-1941 [id number]   Assessment-CSW received voice message from patient's PCP expressing concerns for patient's safety. CSW completed call back to PCP and discussed patient's needs. Patient is currently on the waiting list for both [place] and [place] [facility]. However, she no longer has an aide ([name] was previous aide) and has not gained private pay services in order to gain caretaker. CSW and PCP discussed ways to benefit patient such as gaining a Home Health Aide. PCP is agreeable to making referral for Home Health Services. PCP and CSW discussed the need for possible APS involvement. CSW will make report today. PCP is also requesting that a community nurse be involved with patient's case in order to have more hands on with patient and her care due to her health conditions and recent ED visit. CSW will open case.   CSW contacted patient and she answered. CSW reminded her of [facility] and social work services. Patient is agreeable to social work assistance. Patient is also agreeable to home visit next week to review resources and for CSW to assess for safety within the home.   Plan-CSW completed APS report on 01/26/16. CSW will await to hear back to see if the case will be opened. CSW will make order for [facility] care management. CSW has updated [place]. CSW will complete home visit on 01/30/16.   [name], BSW, MSW, [email or web address] Phone: [phone] Fax: [phone]

## 2016-01-26 NOTE — Progress Notes (Signed)
This encounter was created in error - please disregard.

## 2016-01-26 NOTE — Progress Notes (Signed)
Patient ID: [name], female   DOB: 03-21-1941, 75 y.o.   MRN: [id number]   Location:  [facility] clinic Provider: Shion Bluestein L. Mariea Clonts, D.O., C.M.D.  Code Status: DNR Goals of Care:  Advanced Directives 01/26/2016  Does patient have an advance directive? Yes  Type of Advance Directive Out of facility DNR (pink MOST or yellow form)  Copy of advanced directive(s) in chart? Yes  Would patient like information on creating an advanced directive? -  Pre-existing out of facility DNR order (yellow form or pink MOST form) Pink MOST form placed in chart (order not valid for inpatient use)     Chief Complaint  Patient presents with  . Medical Management of Chronic Issues    ER follow-up cramps in both legs, redness in legs    [name] seen today for ED follow-up for cellulitis.  Erythema of legs is improving.  Right remains slightly more erythematous than left.  She is completing a 7 day course of bactrim and keflex for the cellulitis.  She does have her pills in her possession and started taking them early in the am so she should finish them at the same time.  She and [name], her caregiver, had a disagreement about caregiver going to [facility] for 5 days and not finding anyone to fill in for her in her absence.  Of course, she wound up in the ED with cellulitis since that time.  Nephew's wife has been helping her out a few hrs a day, but cannot be there all of the time.    She has advanced parkinson's.  She is really not able to care for herself and has minimal family social supports.  She has historically fallen frequently.  Each time she visits the ED, she is thought to have cellulitis due to erythema of her legs often due to her venous insufficiency.  Historically also when she has not had a caregiver, she has run out of pain meds and not renewed them, then c/o severe back pain.    Past Medical History  Diagnosis Date  . Parkinson's disease   . Hypertension   . Arthritis   . Anemia     . Cellulitis of lower leg 12/24/2011  . Chronic systolic heart failure ([place])   . Acute on chronic systolic heart failure ([place])   . Cellulitis and abscess of leg, except foot   . Hypotension, unspecified   . Helicobacter pylori (H. pylori)   . CHF (congestive heart failure) ([place])   . Anxiety   . Cataract left  . Glaucoma left  . GERD (gastroesophageal reflux disease)   . Hypercholesterolemia   . Chronic bronchitis ([place])     "usually get it q yr"  . Sleep apnea     "had test years ago; insurance wouldn't pay for mask so I never had one" (04/27/2015)  . Hypothyroidism   . History of blood transfusion     "don't remember why"  . History of bleeding peptic ulcer   . Stroke [facility]) X 3    "that  was the reason I got Parkinson's"    Past Surgical History  Procedure Laterality Date  . Hemiarthroplasty hip Right 09/24/2007    Archie Endo 01/23/2011  . Thyroidectomy    . Esophagogastroduodenoscopy  09/26/2011    Procedure: ESOPHAGOGASTRODUODENOSCOPY (EGD);  Surgeon: Zenovia Jarred, MD;  Location: [facility] ENDOSCOPY;  Service: Gastroenterology;  Laterality: N/A;  To be done at bedside.  . Esophagogastroduodenoscopy N/A 09/30/2013  Procedure: ESOPHAGOGASTRODUODENOSCOPY (EGD);  Surgeon: [name], MD;  Location: [facility];  Service: Endoscopy;  Laterality: N/A;  . Esophagogastroduodenoscopy N/A 11/25/2013    Procedure: ESOPHAGOGASTRODUODENOSCOPY (EGD);  Surgeon: [name], MD;  Location: [facility] ENDOS[facility];  Service: Endoscopy;  Laterality: N/A;  . Tonsillectomy    . Nissen fundoplication      "had OR for GERD"  . Tendon repair      Gluteus medius Archie Endo 01/23/2011  . Back surgery    . Kyphoplasty  06/2009    T12/notes 07/17/2009    Allergies  Allergen Reactions  . Codeine Other (See Comments)    Unknown allergic reaction  . Penicillins Other (See Comments)    Pt passed out in doctor's office after penicillin dose      Medication List       This list is accurate as of: 01/26/16  9:50 AM.   Always use your most recent med list.               carbidopa-levodopa 25-100 MG tablet  Commonly known as:  SINEMET IR  Take 1 at 6a, 1 1/2 pills at [place], 1 at noon, 1 1/2 pills at 3p, 1 at 6pm, 1 1/2 pills at 9pm     cephALEXin 500 MG capsule  Commonly known as:  KEFLEX  Take 1 capsule (500 mg total) by mouth 4 (four) times daily.     docusate sodium 100 MG capsule  Commonly known as:  COLACE  Take 100 mg by mouth daily.     furosemide 40 MG tablet  Commonly known as:  LASIX  Take 40 mg by mouth daily. Reported on 11/13/2015     Iron 325 (65 Fe) MG Tabs  Take 1 capsule by mouth daily.     latanoprost 0.005 % ophthalmic solution  Commonly known as:  XALATAN  Place 1 drop into the left eye daily at 6 PM.     LORazepam 0.5 MG tablet  Commonly known as:  ATIVAN  Take 1 tablet (0.5 mg total) by mouth 2 (two) times daily as needed for anxiety.     Melatonin 5 MG Tabs  Take one tablet by mouth one hour before bedtime for sleep     multivitamin with minerals tablet  Take 1 tablet by mouth daily.     oxyCODONE 5 MG immediate release tablet  Commonly known as:  Oxy IR/ROXICODONE  Take 1 tablet (5 mg total) by mouth 2 (two) times daily as needed for severe pain.     pramipexole 1 MG tablet  Commonly known as:  MIRAPEX  Take 1 tablet (1 mg total) by mouth 3 (three) times daily.     sulfamethoxazole-trimethoprim 800-160 MG tablet  Commonly known as:  BACTRIM DS,SEPTRA DS  Take 1 tablet by mouth 2 (two) times daily.     SYSTANE OP  Place 1 drop into the right eye daily at 6 PM.     Vitamin D 2000 units Caps  Take 1 capsule (2,000 Units total) by mouth daily.        Review of Systems:  Review of Systems  Constitutional: Positive for malaise/fatigue. Negative for fever and chills.  HENT: Negative for hearing loss.   Respiratory: Negative for cough and shortness of breath.   Cardiovascular: Positive for leg swelling. Negative for chest pain and palpitations.   Gastrointestinal: Positive for constipation. Negative for abdominal pain, blood in stool and melena.  Genitourinary: Negative for dysuria.  Musculoskeletal: Negative for myalgias and  falls.  Neurological: Positive for tremors and weakness. Negative for dizziness and loss of consciousness.  Psychiatric/Behavioral: Positive for depression and hallucinations.    Health Maintenance  Topic Date Due  . TETANUS/TDAP  06/07/1959  . COLONOSCOPY  06/06/1990  . ZOSTAVAX  06/06/2000  . DEXA SCAN  06/06/2005  . MAMMOGRAM  11/22/2011  . INFLUENZA VACCINE  04/23/2016  . PNA vac Low Risk Adult  Completed    Physical Exam: Filed Vitals:   01/26/16 0937  BP: 120/80  Pulse: 81  Temp: 97.6 F (36.4 C)  TempSrc: Oral  Height: 5\' 4"  (1.626 m)  Weight: 168 lb (76.204 kg)  SpO2: 95%   Body mass index is 28.82 kg/(m^2). Physical Exam  Constitutional: She is oriented to person, place, and time. She appears well-developed and well-nourished. No distress.  Cardiovascular: Normal rate, regular rhythm, normal heart sounds and intact distal pulses.   Pulmonary/Chest: Effort normal and breath sounds normal. She has no rales.  Abdominal: Soft. Bowel sounds are normal.  Neurological: She is alert and oriented to person, place, and time.   Tremors and dyskinesias severe  Skin: Skin is warm and dry. There is erythema.  Of right leg greater than left, chronic skin thickening which is worsening again w/o a caregiver  Psychiatric: She has a normal mood and affect.    Labs reviewed: Basic Metabolic Panel:  Recent Labs  04/23/15 2025  04/27/15 2112  11/20/15 1451 12/29/15 1017 01/20/16 0036  NA  --   < >  --   < > 139 141 145  K  --   < >  --   < > 4.5 4.1 4.6  CL  --   < >  --   < > 100 101 114*  CO2  --   < >  --   < > 24 23 22   GLUCOSE  --   < >  --   < > 104* 77 103*  BUN  --   < >  --   < > 18 17 19   CREATININE 0.98  < >  --   < > 0.76 0.85 0.86  CALCIUM  --   < >  --   < > 8.9 9.0 8.6*   MG 1.9  --  2.1  --   --   --   --   PHOS 3.7  --   --   --   --   --   --   TSH  --   --  2.277  --   --   --   --   < > = values in this interval not displayed. Liver Function Tests:  Recent Labs  04/03/15 1350 04/24/15 0609 04/27/15 1410  AST 18 30 24   ALT 7 6* 24  ALKPHOS 66 52 67  BILITOT 0.4 1.0 0.8  PROT 5.8* 5.3* 6.2*  ALBUMIN 3.8 2.9* 3.4*   No results for input(s): LIPASE, AMYLASE in the last 8760 hours. No results for input(s): AMMONIA in the last 8760 hours. CBC:  Recent Labs  04/27/15 1410 04/28/15 0534 07/06/15 1359 01/20/16 0036  WBC 10.0 7.8 7.9 8.1  NEUTROABS 7.6  --  5.8 6.0  HGB 14.0 13.0  --  10.2*  HCT 42.7 40.0 36.8 33.3*  MCV 85.1 86.2 84 80.8  PLT 147* 135* 187 147*   Lipid Panel: No results for input(s): CHOL, HDL, LDLCALC, TRIG, CHOLHDL, LDLDIRECT in the last 8760 hours. No results found for: HGBA1C  Procedures since last visit: Dg Tibia/fibula Right  01/20/2016  CLINICAL DATA:  Acute onset of right leg pain and weakness. Initial encounter. EXAM: RIGHT TIBIA AND FIBULA - 2 VIEW COMPARISON:  None. FINDINGS: There is no evidence of fracture or dislocation. The tibia and fibula appear intact. Visualized joint spaces are preserved. No knee joint effusion is seen. The ankle mortise is incompletely assessed, but appears grossly unremarkable. Mild anterior soft tissue swelling is suggested. IMPRESSION: No evidence of fracture or dislocation. Electronically Signed   By: Garald Balding M.D.   On: 01/20/2016 01:42    Assessment/Plan 1. Cellulitis of right lower extremity -apparently both were involved but only right appears mildly red -complete full course of bactrim and keflex as ordered in ED as she is having improvements  2. Parkinson's disease ([place]) -late stage with severe dyskinesias  -she really needs 24 hr care for safety -has family who stop by but are not there all of the time -she had an aide but they had an argument when she left her  for 5 days and didn't find coverage so pt was alone -I called the [facility] social worker who was reaching out to her and has her on some waiting lists -pt really needs more help now--Brooke and I spoke and decided on short term a home health referral with an aide, but also to get APS involved as we do not think she is safe at home and neglecting herself  3. Midline low back pain without sciatica -chronic, using oxycodone, will need to monitor for need for refills as she has minimal help at home and has historically run out of meds  4. Visual hallucinations -more recent problem which seems to be due to her PD rather than her sinemet itself as reduction did not decrease hallucinations (per caregiver last visit) -may benefit from parkinson's hallucinations medications  5. Chronic combined systolic and diastolic congestive heart failure (HCC) -cont routine lasix  6. Chronic venous insufficiency -elevate feet at rest, has never been adherent with compression hose   Labs/tests ordered:  No new Next appt:  03/29/2016  Kristain Hu L. Greogry Goodwyn, D.O. [facility] [address], [place] [address] Cell Phone (Mon-Fri 8am-5pm):  [phone] On Call:  [phone] & follow prompts after 5pm & weekends Office Phone:  [phone] Office Fax:  [phone]

## 2016-01-26 NOTE — Patient Outreach (Signed)
[place] [facility]) Care Management  01/26/2016  [name] 06/19/1941 [id number]   Patient with recent ER visit and safety concerns.  Patient referred by social work for [employer] nurse to follow.   Plan: RN Health Coach will send physician letter notifying of transfer.  Jone Baseman, RN, MSN [place] [phone]

## 2016-01-29 ENCOUNTER — Other Ambulatory Visit: Payer: Self-pay | Admitting: Licensed Clinical Social Worker

## 2016-01-29 ENCOUNTER — Telehealth: Payer: Self-pay | Admitting: *Deleted

## 2016-01-29 NOTE — Telephone Encounter (Signed)
Patient would like the Rx mailed to her home address.

## 2016-01-29 NOTE — Patient Outreach (Signed)
[facility]) Care Management  01/29/2016  [name] July 06, 1941 [id number]   Assessment-CSW completed outreach to patient. Patient answered. CSW reminded her of upcoming home visit tomorrow and patient is still agreeable.  Plan-CSW will complete home visit on 01/30/16.  [name], BSW, MSW, [email or web address] Phone: [phone] Fax: [phone]

## 2016-01-29 NOTE — Progress Notes (Signed)
This encounter was created in error - please disregard.

## 2016-01-29 NOTE — Telephone Encounter (Signed)
I do.  I will write a prescription. Where would she like this sent?

## 2016-01-29 NOTE — Telephone Encounter (Signed)
Patient called and wanted to know if you think a lift chair or Reclining Chair would benefit her. If so can she have a Rx written for this. Please Advise.

## 2016-01-30 ENCOUNTER — Other Ambulatory Visit: Payer: Self-pay | Admitting: Licensed Clinical Social Worker

## 2016-01-30 DIAGNOSIS — M5136 Other intervertebral disc degeneration, lumbar region: Secondary | ICD-10-CM | POA: Diagnosis not present

## 2016-01-30 DIAGNOSIS — M41125 Adolescent idiopathic scoliosis, thoracolumbar region: Secondary | ICD-10-CM | POA: Diagnosis not present

## 2016-01-30 DIAGNOSIS — Z79891 Long term (current) use of opiate analgesic: Secondary | ICD-10-CM | POA: Diagnosis not present

## 2016-01-30 DIAGNOSIS — G894 Chronic pain syndrome: Secondary | ICD-10-CM | POA: Diagnosis not present

## 2016-01-30 NOTE — Telephone Encounter (Signed)
Rx mailed to patient's home address. Sent copy for scanning.

## 2016-01-30 NOTE — Patient Outreach (Signed)
[facility]) Care Management  01/30/2016  [name] 09-11-1941 [id number]   Assessment-CSW completed initial home visit today on 01/30/16. CSW arrived and patient was waiting in her chair, ready to complete assessment. Patient is oriented to person, place and time. CSW provided education on [facility] services and social work assistance. Patient reports that she is experiencing chronic pain in her back today. Patient reports that she received private pay resources in the mail and has them in her bedroom. Patient no longer has her aide [name] who stayed with her and assisted her throughout the day. Patient reports that she paid her $150.00 per week for services (she lived with patient.) Patient and caregiver had a disagreement and now she no longer provides care for her. Patient reports that she called "several" of the private pay agencies but does not remember the names of the companies but knows that their prices were too high. Patient reports her income is over $1,000 but cannot give exact amount. Patent not interested in completing Medicaid application stating "I make too much." CSW educated her on the personal care service program that is paid for by Medicaid. PCP is concerned for patient's safety now that she is living in her residence alone. Patient reports that her brother comes by daily to check on her for a short period of time. Patient states that her niece comes every other day in the morning to assist her for 10 minutes before she goes to work with getting out of the bed if needed and then returns during the evening to check on her. Patient reports that she had a "good day today getting out of the bed." Patient gets mobile meals. [employer] arrived during visit and they asked patient if she liked to read and she said yes. [employer] volunteer shares that he will bring books by her residence next time. Patient still is in need of additional support within the home. Patient's home is  very spacious but extremely cluttered. Patient IS able to direct me to where her medical paperwork is and where the private pay resources were that CSW mailed to her. CSW completed review of these private pay resources. CSW contacted [employer] per patient's choice and the price was 18.50 and 18.00 per hour with a minimum of 3 hours per day. Patient shares that she is not willing to pay for these services. Patient is on waiting list for [facility] [place] and [employer]. CSW filed APS report but has not heard back.   CSW completed outreach to patient's brother with patient's consent signed. Patient's brother answered. Patient's brother states that he and patient have both been trying to contact previous caregiver to see if she would consider providing care again but they have been unable to reach her. CSW provided updates on patient and the two programs that she is on the waiting list for. CSW encouraged brother to continue to network in the community to find a new caregiver as patient is not willing to use any private pay agencies at this time due to cost. Brother agreed to do so. Brother's spouse has alzheimer's and he reports that he is usually providing care to her when he is not assisting patient.    Patient denies any sadness or depression stating "I have a lot of health conditions but I have a lot to be thankful for." Patient reports that she gains pleasure out of life by sewing, completing puzzle books, playing  music on her stereo and singing and talking to her friend Earlie Server daily. CSW educated patient on ways to improve socialization such as Psychiatric nurse that provide activities such as book clubs, crafts and exercise groups. CSW also educated patient on the PACE program. Patient not interested at this time but is appreciative of resource information. CSW will continue to educate patient on ways to improve socialization and gain additional care and  support.    Patient denies transportation issues reporting that her brother usually takes her to all medical appointments and she has [employer] as well.    THN CM Care Plan Problem Three        Most Recent Value   Care Plan Problem Three  Lack of caregiver, support and resources.   Role Documenting the Problem Three  Clinical Social Worker   Care Plan for Problem Three  Active   THN Long Term Goal (31-90) days  Patient will be investigated by APS due to lack of support within the home.    THN Long Term Goal Start Date  01/30/16   Interventions for Problem Three Long Term Goal  CSW has discussed patient with PCP and made report to APS. CSW has completed home evaluation. CSW will await to hear back from APS.   THN CM Short Term Goal #1 (0-30 days)  Patient will gain private pay resources within 30 days.   THN CM Short Term Goal #1 Start Date  01/30/16   Interventions for Short Term Goal #1  CSW has put patient on two different waiting list for [employer] and [employer] and [facility] [place]. CSW has mailed out resources to patient's residence and will complete review of resources with patient.   THN CM Short Term Goal #2 (0-30 days)  Patient will be educated on appropriate socialization activities within 30 days   THN CM Short Term Goal #2 Start Date  01/30/16   Interventions for Short Term Goal #2  CSW will provide patient with resources for [facility] in order to gain exercise and socialization to improve overall well being and health.     Plan-CSW will send entire encounter to PCP. CSW will make next outreach within two weeks. CSW will await to hear back from APS.  [name], BSW, MSW, [email or web address] Phone: [phone] Fax: [phone]

## 2016-01-31 ENCOUNTER — Telehealth: Payer: Self-pay | Admitting: Neurology

## 2016-01-31 NOTE — Telephone Encounter (Signed)
Pt's husband called-requested to c/a appt for today at 12:45. This message was rec'd by the answering service.  [name]               CID  [id number]   Patient  [name]            Pt's Dr  Rexene Alberts         Area Code  336  Phone#  [phone] APPT 5/10 @12 :45PM                         Operator called pt back-relayed that appt was for next Wednesday 5/17. Pt said ok

## 2016-02-01 ENCOUNTER — Ambulatory Visit: Payer: Medicare Other | Admitting: Internal Medicine

## 2016-02-01 DIAGNOSIS — G20 Parkinson's disease: Secondary | ICD-10-CM | POA: Diagnosis not present

## 2016-02-01 DIAGNOSIS — M545 Low back pain: Secondary | ICD-10-CM | POA: Diagnosis not present

## 2016-02-01 DIAGNOSIS — M6281 Muscle weakness (generalized): Secondary | ICD-10-CM | POA: Diagnosis not present

## 2016-02-01 DIAGNOSIS — I5042 Chronic combined systolic (congestive) and diastolic (congestive) heart failure: Secondary | ICD-10-CM | POA: Diagnosis not present

## 2016-02-01 DIAGNOSIS — I872 Venous insufficiency (chronic) (peripheral): Secondary | ICD-10-CM | POA: Diagnosis not present

## 2016-02-01 DIAGNOSIS — F0280 Dementia in other diseases classified elsewhere without behavioral disturbance: Secondary | ICD-10-CM | POA: Diagnosis not present

## 2016-02-01 DIAGNOSIS — L03115 Cellulitis of right lower limb: Secondary | ICD-10-CM | POA: Diagnosis not present

## 2016-02-01 DIAGNOSIS — R441 Visual hallucinations: Secondary | ICD-10-CM | POA: Diagnosis not present

## 2016-02-05 ENCOUNTER — Other Ambulatory Visit: Payer: Self-pay | Admitting: *Deleted

## 2016-02-05 DIAGNOSIS — M6281 Muscle weakness (generalized): Secondary | ICD-10-CM | POA: Diagnosis not present

## 2016-02-05 DIAGNOSIS — G20 Parkinson's disease: Secondary | ICD-10-CM | POA: Diagnosis not present

## 2016-02-05 DIAGNOSIS — R441 Visual hallucinations: Secondary | ICD-10-CM | POA: Diagnosis not present

## 2016-02-05 DIAGNOSIS — L03115 Cellulitis of right lower limb: Secondary | ICD-10-CM | POA: Diagnosis not present

## 2016-02-05 DIAGNOSIS — M545 Low back pain: Secondary | ICD-10-CM | POA: Diagnosis not present

## 2016-02-05 DIAGNOSIS — F0280 Dementia in other diseases classified elsewhere without behavioral disturbance: Secondary | ICD-10-CM | POA: Diagnosis not present

## 2016-02-05 NOTE — Patient Outreach (Signed)
[facility]) Care Management [place] Telephone Outreach 02/05/2016  [name] 1941-01-30 [id number]  Successful telephone outreach to [name], 75 y/o female followed by [name] on referral from [place] after ED visit 01/19/16 for safety concerns; prior to that, patient had been followed previously by [facility] telephonic CM.   During our phone conversation today, patient states that she "can't talk very long," stating, "my voice keeps going in and out."  [name] reports that she is "doing pretty good," but states that she continues having "spells where I can't move," which she attributes to her Parkinson's Disease, which is actively followed by neurology.  [name] confirms that she has a follow up appointment with her neurologist on Wednesday Feb 07, 2016.  [name] states that she "isn't sure how" she will get transportation to that appointment, but states, "I usually use [employer] or [employer]."  [name] states that she plans to call this afternoon to arrange her transportation through one of these resources.  [name] states that "right now I have a nurse that comes in to help me," although she cannot tell me how often the nurse comes, nor if it is a home health or private nurse; stating that she "doesn't know who she works for;"  I was unable to find additional information around this report through EMR review.  [name] states that she "usually" takes all of her medicines as they are prescribed, "unless I get too busy and forget."  [name] states that when that happens, "I just take them when I remember that I forgot."  [name] reports that she fills her own pill boxes herself each week.  [name] states that she has not been weighing herself regularly, and confirmed that when she does weigh, she does not record the values.  [name] currently states that she "does not know" how much she weighs.  We discussed [facility] CM services and I made  sure that [name] has my name and contact information.  Today, [name] did not wish to schedule an in-home visit, stating that she would like to wait when "my voice isn't so weak."  We agreed that I would follow up with [name] next week to schedule an in-home visit.  Plan: [name] will contact [employer] or senior [employer] to schedule transportation to her upcoming neurology provider appointment. [name] will begin weighing herself every day and will write down her daily weights. [place] telephone outreach next week.  [name], RN, BSN, [employer] [facility] Care Management  [phone]

## 2016-02-06 ENCOUNTER — Telehealth: Payer: Self-pay | Admitting: *Deleted

## 2016-02-06 DIAGNOSIS — M6281 Muscle weakness (generalized): Secondary | ICD-10-CM | POA: Diagnosis not present

## 2016-02-06 DIAGNOSIS — G20 Parkinson's disease: Secondary | ICD-10-CM | POA: Diagnosis not present

## 2016-02-06 DIAGNOSIS — R441 Visual hallucinations: Secondary | ICD-10-CM | POA: Diagnosis not present

## 2016-02-06 DIAGNOSIS — F0280 Dementia in other diseases classified elsewhere without behavioral disturbance: Secondary | ICD-10-CM | POA: Diagnosis not present

## 2016-02-06 DIAGNOSIS — L03115 Cellulitis of right lower limb: Secondary | ICD-10-CM | POA: Diagnosis not present

## 2016-02-06 DIAGNOSIS — M545 Low back pain: Secondary | ICD-10-CM | POA: Diagnosis not present

## 2016-02-06 NOTE — Telephone Encounter (Signed)
[name] with [facility] called back and patient's weight today was 162lb and they are not sure that she has a nebulizer machine. They have not seen one and patient has not reported that she does and none of her medications are for Neb.

## 2016-02-06 NOTE — Telephone Encounter (Signed)
Has her weight gone up?  Does she still have a nebulizer machine in her home?

## 2016-02-06 NOTE — Telephone Encounter (Signed)
Betsy notified and agreed.

## 2016-02-06 NOTE — Telephone Encounter (Signed)
Betsy with [facility] called and stated that patient was having some Wheezing. O2 level is 98% and vitals good but just wanted to let you know. Also wanted a verbal order to continue therapy. Verbal order given.

## 2016-02-06 NOTE — Telephone Encounter (Signed)
Ok, so weight is down suggesting she's probably not eating well being there on her own.   If wheezing continues or she becomes more sob, oxygen drops, please ask [name] to call back to let me know so we can get a nebulizer and nebs ordered for her.  I don't think she's able to use inhalers successfully.

## 2016-02-06 NOTE — Telephone Encounter (Signed)
Called and spoke with [name] with [facility]. She is going to call back with Weight and ask about Nebulizer Machine and call us back.

## 2016-02-07 ENCOUNTER — Ambulatory Visit (INDEPENDENT_AMBULATORY_CARE_PROVIDER_SITE_OTHER): Payer: Medicare Other | Admitting: Neurology

## 2016-02-07 ENCOUNTER — Encounter: Payer: Self-pay | Admitting: Neurology

## 2016-02-07 VITALS — BP 136/88 | HR 78 | Resp 20 | Ht 64.0 in | Wt 166.0 lb

## 2016-02-07 DIAGNOSIS — M7989 Other specified soft tissue disorders: Secondary | ICD-10-CM | POA: Diagnosis not present

## 2016-02-07 DIAGNOSIS — G20 Parkinson's disease: Secondary | ICD-10-CM

## 2016-02-07 DIAGNOSIS — Z9181 History of falling: Secondary | ICD-10-CM

## 2016-02-07 DIAGNOSIS — G249 Dystonia, unspecified: Secondary | ICD-10-CM

## 2016-02-07 NOTE — Progress Notes (Signed)
Subjective:    Patient ID: [name] is a 75 y.o. female.  HPI     Interim history:   [name] is a 75 year old right-handed woman with an underlying complex medical history of hypertension, arthritis, anemia, cellulitis, chronic systolic heart failure, peptic ulcer disease, and chronic low back pain with history of T12 compression fracture, status post kyphoplasty in October 2010, who presents for follow-up consultation of her Parkinson's disease, complicated by motor complications, motor fluctuations and recurrent falls, deconditioning, recent cellulitis. The patient is unaccompanied today. [name] her live-in caretaker moved to [facility] to be with her parents, [name] says. I last saw her on 10/10/2015, at which time the patient reported feeling stable. [name] reported no recent falls. She did report of recent fall about 3 weeks prior in the kitchen, indicating that she let go of her walker and fell backwards. She did not hurt her self, she had ongoing issues with severe dyskinesias and some visual hallucinations. She had lower extremity swelling. She had been using oxycodone as needed for low back pain. I've reduced her Mirapex because of lower extremity swelling. She was advised to continue with Sinemet, one pills 6 times a day.   Today, 02/07/2016: She reports that the increase in C/L helped with the freezing. She takes 1 pill, alternating with 1 1/2 pills, every 3 hours, starting at 6 AM and ending at 9 PM. She did fall recently as she was getting out of her new recliner and thankfully, [name] was there, her nephew's [name]) wife. She checks on patient morning and evening. Her brother ([name]'s dad) lives in [place], her other brother (in [place]) brings her supper and for lunch she has meals on wheels. She was seen in the interim by her primary care physician on 01/26/2016 and was noted to have right lower extremity cellulitis. She was supposed to complete dual antibiotic treatment. She needed more  supervision at the house and a social work referral was done. I increased her Sinemet in the interim to 1 pill alternating with 1-1/2 pills. She called in late April reporting more freezing.   Previously:     I saw her on 07/10/15, at which time she reported feeling okay. She had recently been in [name]. [name] moved in with her to help. Patient had not fallen since her [name] stay. She was getting HH therapies. She was using her 4 wheeled walker consistently. I suggested she continue with Sinemet 1 tablet 6 times a day and Mirapex 1.5 mg 3 times a day.  I saw her on 01/16/2015 for sooner than scheduled appointment due to more freezing reported. She reported taking Ativan 0.5 mg as needed for anxiety but not every day. She was taking Sinemet 1 pill 6 times a day. She was on Mirapex 1.5 mg 3 times a day. She was encouraged to continue these medications. In addition, I suggested as needed Parcopa 25-100 milligrams strength half a pill up to twice daily to help with freezing. In addition, she was encouraged to start gabapentin slowly which was prescribed by another physician for leg pain. She was in [facility] at this time and enjoying it. Her cellulitis was improving, on Abx. Her freezing episodes were happening in the late morning and late afternoons. She reported no recent falls. In the interim, the patient called on 02/06/2015 reporting that the medication changes were not helpful. She was advised to stop the Parcopa and instead increase her Sinemet by alternating 1 pill with 1-1/2 pills  for a total of 6 doses a day. Of note, she no showed for an appointment on 03/07/2015.  In the interim, she was seen by Cecille Rubin, NP, on 03/14/2015, at which time, her medication including Sinemet and Mirapex were kept the same.  In the interim, she was hospitalized twice, on 04/23/2015 through 04/26/2015 for global weakness and failure to thrive, as well as worsening Parkinson symptoms. Amantadine was added to her  regimen. She was discharged with home health therapies as she could not afford skilled nursing for rehabilitation. She was readmitted on 04/27/2015 for altered mental status in the context of UTI and recently started amantadine. She was discharged on 05/01/2015. She was treated for UTI and amantadine was discontinued. She was transferred to skilled nursing facility.  She never married. She has no children. She has a big extended family. She has 1 sister and 5 brothers. Her brother [name], sees her every day.  I first met her on 11/01/2014, at which time she reported more freezing and feeling worse. She fell the night before. She did not injure herself. She was living alone. She was getting Meals on Wheels and has a call alert button. I kept her on Sinemet 1-1/2 pills 4 times a day and Mirapex 1-1/2 mg strength one pill 3 times a day which was maxed out. She presents for a sooner than scheduled appointment due to exacerbation of her tremors. In the interim, on 12/30/2014 she was seen at [facility] senior care for a cellulitis. She was placed on Cipro. She was also seen on 01/12/15 for follow up and started on gabapentin 100 mg tid for leg pain.   She previously saw Dr. Jim Like on 02/28/2014, at which time she was kept on levodopa at the current dose and Mirapex at the current dose. Prior to that she used to see Dr. Krista Blue (05/25/13) and has also seen our nurse practitioner, Cecille Rubin, last on 01/28/14. Her symptoms date back to 1999 when she first noticed L hand stiffness, fine motor dyscontrol and gait changes. She fell last night but thankfully did not injure herself. She was admitted to the hospital from 08/06/14 to 08/10/14 for N/V/D.   She lives alone. She has 5 brothers and 1 sister, 1 brother lives in [place] but otherwise her siblings are close by. One brother brings her supper every night. She gets Meals on Wheels. She has a call alert button. She has no family history of Parkinson's disease. She  uses a 2 wheeled walker at the house. She has generalized dyskinesias. She may not drink enough water. She has lower extremity swelling but this has improved. Her short-term memory is getting worse she feels. She denies any hallucinations.  Her Past Medical History Is Significant For: Past Medical History  Diagnosis Date  . Parkinson's disease   . Hypertension   . Arthritis   . Anemia   . Cellulitis of lower leg 12/24/2011  . Chronic systolic heart failure ([place])   . Acute on chronic systolic heart failure ([place])   . Cellulitis and abscess of leg, except foot   . Hypotension, unspecified   . Helicobacter pylori (H. pylori)   . CHF (congestive heart failure) ([place])   . Anxiety   . Cataract left  . Glaucoma left  . GERD (gastroesophageal reflux disease)   . Hypercholesterolemia   . Chronic bronchitis ([place])     "usually get it q yr"  . Sleep apnea     "had test years ago; insurance wouldn't  pay for mask so I never had one" (04/27/2015)  . Hypothyroidism   . History of blood transfusion     "don't remember why"  . History of bleeding peptic ulcer   . Stroke [facility]) X 3    "that  was the reason I got Parkinson's"    Her Past Surgical History Is Significant For: Past Surgical History  Procedure Laterality Date  . Hemiarthroplasty hip Right 09/24/2007    Archie Endo 01/23/2011  . Thyroidectomy    . Esophagogastroduodenoscopy  09/26/2011    Procedure: ESOPHAGOGASTRODUODENOSCOPY (EGD);  Surgeon: Zenovia Jarred, MD;  Location: [facility] ENDOSCOPY;  Service: Gastroenterology;  Laterality: N/A;  To be done at bedside.  . Esophagogastroduodenoscopy N/A 09/30/2013    Procedure: ESOPHAGOGASTRODUODENOSCOPY (EGD);  Surgeon: [name], MD;  Location: [place];  Service: Endoscopy;  Laterality: N/A;  . Esophagogastroduodenoscopy N/A 11/25/2013    Procedure: ESOPHAGOGASTRODUODENOSCOPY (EGD);  Surgeon: [name], MD;  Location: [facility] ENDOSCOPY;  Service: Endoscopy;  Laterality: N/A;  . Tonsillectomy    . Nissen  fundoplication      "had OR for GERD"  . Tendon repair      Gluteus medius Archie Endo 01/23/2011  . Back surgery    . Kyphoplasty  06/2009    T12/notes 07/17/2009    Her Family History Is Significant For: Family History  Problem Relation Age of Onset  . GER disease Mother   . Heart disease Father     Her Social History Is Significant For: Social History   Social History  . Marital Status: Single    Spouse Name: N/A  . Number of Children: 0  . Years of Education: 12   Occupational History  .      Retired   Social History Main Topics  . Smoking status: Never Smoker   . Smokeless tobacco: Never Used  . Alcohol Use: No  . Drug Use: No  . Sexual Activity: No   Other Topics Concern  . None   Social History Narrative   Patient lives at home alone. Patient is retired. Patient has high school education.   Caffeine- one daily.   Right handed.    Her Allergies Are:  Allergies  Allergen Reactions  . Codeine Other (See Comments)    Unknown allergic reaction  . Penicillins Other (See Comments)    Pt passed out in doctor's office after penicillin dose  :   Her Current Medications Are:  Outpatient Encounter Prescriptions as of 02/07/2016  Medication Sig  . carbidopa-levodopa (SINEMET IR) 25-100 MG tablet Take 1 at 6a, 1 1/2 pills at 9a, 1 at noon, 1 1/2 pills at 3p, 1 at 6pm, 1 1/2 pills at 9pm  . cephALEXin (KEFLEX) 500 MG capsule Take 1 capsule (500 mg total) by mouth 4 (four) times daily.  . Cholecalciferol (VITAMIN D) 2000 UNITS CAPS Take 1 capsule (2,000 Units total) by mouth daily.  Marland Kitchen docusate sodium (COLACE) 100 MG capsule Take 100 mg by mouth daily.   . Ferrous Sulfate (IRON) 325 (65 FE) MG TABS Take 1 capsule by mouth daily.  . furosemide (LASIX) 40 MG tablet Take 40 mg by mouth daily. Reported on 11/13/2015  . latanoprost (XALATAN) 0.005 % ophthalmic solution Place 1 drop into the left eye daily at 6 PM.   . LORazepam (ATIVAN) 0.5 MG tablet Take 1 tablet (0.5 mg  total) by mouth 2 (two) times daily as needed for anxiety.  . Melatonin 5 MG TABS Take one tablet by mouth one hour  before bedtime for sleep  . Multiple Vitamins-Minerals (MULTIVITAMIN WITH MINERALS) tablet Take 1 tablet by mouth daily.  Marland Kitchen oxyCODONE (OXY IR/ROXICODONE) 5 MG immediate release tablet Take 1 tablet (5 mg total) by mouth 2 (two) times daily as needed for severe pain.  Vladimir Faster Glycol-Propyl Glycol (SYSTANE OP) Place 1 drop into the right eye daily at 6 PM.  . pramipexole (MIRAPEX) 1 MG tablet Take 1 tablet (1 mg total) by mouth 3 (three) times daily.  Marland Kitchen sulfamethoxazole-trimethoprim (BACTRIM DS,SEPTRA DS) 800-160 MG tablet Take 1 tablet by mouth 2 (two) times daily.   No facility-administered encounter medications on file as of 02/07/2016.  :  Review of Systems:  Out of a complete 14 point review of systems, all are reviewed and negative with the exception of these symptoms as listed below:   Review of Systems  Neurological:       Patient reports that she is having more freezing spells.  The increase in Sinemet has helped per patient.      Objective:  Neurologic Exam  Physical Exam Physical Examination:   Filed Vitals:   02/07/16 1257  BP: 136/88  Pulse: 78  Resp: 20    General Examination: The patient is a very pleasant 75 y.o. female in no acute distress. She is situated in her wheelchair. She is frail appearing. She is adequately groomed. She brought her 4 wheeled walker. She has mild dyskinesias today.  HEENT: Normocephalic, atraumatic, pupils are equal, round and reactive to light and accommodation. Extraocular tracking shows moderate saccadic breakdown without nystagmus noted. There is limitation to upper gaze. There is mild decrease in eye blink rate. Hearing is intact. Face is symmetric with mild facial masking and normal facial sensation. There is no lip, neck or jaw tremor. Neck is mildly rigid with intact passive ROM. There are no carotid bruits on  auscultation. Oropharynx exam reveals moderate to severe mouth dryness. No significant airway crowding is noted. Mallampati is class II. Tongue protrudes centrally and palate elevates symmetrically. There is no drooling. Speech is moderately hypophonic.  Chest: is clear to auscultation without wheezing, rhonchi or crackles noted.  Heart: sounds are regular and normal without murmurs, rubs or gallops noted.   Abdomen: is soft, non-tender and non-distended with normal bowel sounds appreciated on auscultation.  Extremities: There is 1+ pitting edema in the distal lower extremities bilaterally, mild redness and thickness of the skin, overall mild improvement in her swelling. She is not wearing compression stockings.  Skin: is warm and dry with no trophic changes noted. Age-related changes are noted on the skin.   Musculoskeletal: exam reveals no obvious joint deformities, tenderness, joint swelling or erythema, except for arthritic changes in her hands.  Neurologically:  Mental status: The patient is awake and alert, paying good attention. She is able to completely provide the history. She is oriented to: person, place, situation, day of week, month of year and year. Her memory, attention, language and knowledge are mildly impaired. She has a mild degree of bradyphrenia. Speech is moderately hypophonic with moderate dysarthria noted. Mood is congruent and affect is normal.   Cranial nerves are as described above under HEENT exam. She is leaning to the right. It is difficult to examine her shoulder height and shoulder strength. Motor exam: Normal bulk, and strength for age is noted. There are mild generalized dyskinesias noted. Tone is mildly rigid with absence of cogwheeling. There is overall moderate bradykinesia. There is no drift or rebound.  There is a  mild to moderate LUE and LLE resting tremor.  Romberg is not tested as she is not safe to stand unassisted.   Reflexes are 1+ in the upper  extremities and trace in the lower extremities.   Fine motor skills exam: Finger taps are moderately impaired on the right and moderately impaired on the left. Hand movements are mildly impaired on the right and moderately impaired on the left. RAP (rapid alternating patting) is moderately impaired on the right and moderately impaired on the left. Foot taps are moderately impaired on the right and moderately impaired on the left. Foot agility (in the form of heel stomping) is mildly impaired on the right and moderately impaired on the left.  Cerebellar testing shows no dysmetria or intention tremor on finger to nose testing. Heel to shin is unremarkable bilaterally.    Sensory exam is intact to light touch in the upper and lower extremities.   Gait, station and balance: She stands with difficulty and has to push herself up, requiring 2 attempts, and mild assistance was needed. Posture is moderately stooped and she also has evidence of scoliosis. She has no freezing while initiating movement. She walks slowly with a shuffle, using her 4 wheeled walker with seat, she turns in 4 steps. Balance is impaired.   Assessment and Plan:   In summary, AURILLA COULIBALY is a very pleasant 75 year old female with an underlying complex medical history of hypertension, arthritis, anemia, cellulitis, chronic systolic heart failure and cor pulmonale, peptic ulcer disease, and chronic low back pain with history of T12 compression fracture, status post kyphoplasty in October 2010, who presents for follow-up consultation of her parkinsonism with Sx dating back to 1999. Her history and physical exam are in keeping with, left-sided predominant PD, complicated by dyskinesias, overall frailty, lower extremity swelling, cellulitis, freezing and recurrent falls. She has had hospital admissions and inpatient rehabilitations. She finished home health therapies. She was not able to tolerate amantadine.we reduced her Mirapex last time  because of the swelling, this appears to have improved some. In the interim, I also increased her Sinemet to 1 pill alternating with 1-1/2 pills for a total of 6 doses, starting at 6 AM, ending at 9 PM, this helped her freezing spells she feels. She has ongoing visual hallucinations which are not scary or bothersome at this time. I would like to continue with her current medication regimen. Unfortunately, her live-in caretaker had to move to [place]. She does have someone check on her every day, primarily her nephews wife. In addition, she gets her meals usually delivered, for breakfast she usually has leftovers or fixes a little something herself. She did fall recently but thankfully did not injure herself. She says she has a new recliner and did not really know how to use it very well. She does need supervision and ideally someone there all the time. She has a call alert button at least. She did not need refills today. I will see her back routinely in 4 months, sooner if the need arises, she came via bus today. I spent 25 minutes in total face-to-face time with the patient, more than 50% of which was spent in counseling and coordination of care, reviewing test results, reviewing medication and discussing or reviewing the diagnosis of PD, its prognosis and treatment options.

## 2016-02-07 NOTE — Patient Instructions (Signed)
We will continue with your current medications.  Please remember to use your walker at all times and drink plenty of water.

## 2016-02-08 ENCOUNTER — Other Ambulatory Visit: Payer: Self-pay | Admitting: Licensed Clinical Social Worker

## 2016-02-08 DIAGNOSIS — F0280 Dementia in other diseases classified elsewhere without behavioral disturbance: Secondary | ICD-10-CM | POA: Diagnosis not present

## 2016-02-08 DIAGNOSIS — M6281 Muscle weakness (generalized): Secondary | ICD-10-CM | POA: Diagnosis not present

## 2016-02-08 DIAGNOSIS — G20 Parkinson's disease: Secondary | ICD-10-CM | POA: Diagnosis not present

## 2016-02-08 DIAGNOSIS — M545 Low back pain: Secondary | ICD-10-CM | POA: Diagnosis not present

## 2016-02-08 DIAGNOSIS — L03115 Cellulitis of right lower limb: Secondary | ICD-10-CM | POA: Diagnosis not present

## 2016-02-08 DIAGNOSIS — R441 Visual hallucinations: Secondary | ICD-10-CM | POA: Diagnosis not present

## 2016-02-08 NOTE — Patient Outreach (Signed)
[facility]) Care Management  02/08/2016  [name] 06-Feb-1941 [id number]   Assessment-CSW received call from [name]. [name] states that she has completed several visits with patient and has one additional visit scheduled. [name] shares that patient IS competent in making her own choices. [name] shares that case is still open and that she will contact with case decision.  Plan-CSW will continue to provide social work assistance to patient. CSW will await to hear APS case decision.  [name], BSW, MSW, [email or web address] Phone: [phone] Fax: [phone]

## 2016-02-09 ENCOUNTER — Telehealth: Payer: Self-pay | Admitting: *Deleted

## 2016-02-09 DIAGNOSIS — L03115 Cellulitis of right lower limb: Secondary | ICD-10-CM | POA: Diagnosis not present

## 2016-02-09 DIAGNOSIS — M545 Low back pain: Secondary | ICD-10-CM | POA: Diagnosis not present

## 2016-02-09 DIAGNOSIS — M6281 Muscle weakness (generalized): Secondary | ICD-10-CM | POA: Diagnosis not present

## 2016-02-09 DIAGNOSIS — G20 Parkinson's disease: Secondary | ICD-10-CM | POA: Diagnosis not present

## 2016-02-09 DIAGNOSIS — R441 Visual hallucinations: Secondary | ICD-10-CM | POA: Diagnosis not present

## 2016-02-09 DIAGNOSIS — F0280 Dementia in other diseases classified elsewhere without behavioral disturbance: Secondary | ICD-10-CM | POA: Diagnosis not present

## 2016-02-09 NOTE — Telephone Encounter (Signed)
Olivia Mackie with [facility] called and stated that patient has been having on and off issues with swallowing. Patient stated food gets stuck sometimes. Nurse wants a verbal order for Speech Therapy.  Verbal order given.

## 2016-02-09 NOTE — Telephone Encounter (Signed)
Noted  

## 2016-02-12 ENCOUNTER — Ambulatory Visit: Payer: Medicare Other

## 2016-02-12 DIAGNOSIS — G20 Parkinson's disease: Secondary | ICD-10-CM | POA: Diagnosis not present

## 2016-02-12 DIAGNOSIS — F0280 Dementia in other diseases classified elsewhere without behavioral disturbance: Secondary | ICD-10-CM | POA: Diagnosis not present

## 2016-02-12 DIAGNOSIS — R441 Visual hallucinations: Secondary | ICD-10-CM | POA: Diagnosis not present

## 2016-02-12 DIAGNOSIS — L03115 Cellulitis of right lower limb: Secondary | ICD-10-CM | POA: Diagnosis not present

## 2016-02-12 DIAGNOSIS — M545 Low back pain: Secondary | ICD-10-CM | POA: Diagnosis not present

## 2016-02-12 DIAGNOSIS — M6281 Muscle weakness (generalized): Secondary | ICD-10-CM | POA: Diagnosis not present

## 2016-02-13 ENCOUNTER — Other Ambulatory Visit: Payer: Self-pay | Admitting: *Deleted

## 2016-02-13 ENCOUNTER — Telehealth: Payer: Self-pay

## 2016-02-13 DIAGNOSIS — G20 Parkinson's disease: Secondary | ICD-10-CM | POA: Diagnosis not present

## 2016-02-13 DIAGNOSIS — F0280 Dementia in other diseases classified elsewhere without behavioral disturbance: Secondary | ICD-10-CM | POA: Diagnosis not present

## 2016-02-13 DIAGNOSIS — M6281 Muscle weakness (generalized): Secondary | ICD-10-CM | POA: Diagnosis not present

## 2016-02-13 DIAGNOSIS — L03115 Cellulitis of right lower limb: Secondary | ICD-10-CM | POA: Diagnosis not present

## 2016-02-13 DIAGNOSIS — M545 Low back pain: Secondary | ICD-10-CM | POA: Diagnosis not present

## 2016-02-13 DIAGNOSIS — R441 Visual hallucinations: Secondary | ICD-10-CM | POA: Diagnosis not present

## 2016-02-13 NOTE — Telephone Encounter (Signed)
Please call back and advise pt to increase lasix to 40mg  po bid for 3 days and continue to monitor weight.  If weight not back down to baseline of 163 after the 3 days and shortness of breath not better after the 3 days or gets worse, she should be seen in the ED.

## 2016-02-13 NOTE — Telephone Encounter (Signed)
Spoke with patient, patient verbalized understanding of instructions. I called Betsy ([facility] Nurse) and informed her of Dr.Reed's instructions as well.

## 2016-02-13 NOTE — Telephone Encounter (Signed)
Message left on triage voicemail: Patient with SOB and wheezing. Current Vitals: Weight 170 (increased by 7 lb in 5 days), Temp: 98.6, Resp: 24 , SpO2: 97%, pulse: 72  Last OV: 01-26-16 Pending OV: 03-29-16  FYI

## 2016-02-13 NOTE — Patient Outreach (Signed)
[place]) Care Management [place] Telephone Outreach 02/13/2016  [name] [birth year] [id number]  Unsuccessful telephone outreach to [name], 75 y/o female followed by [facility] on referral from [place] after ED visit 01/19/16 for safety concerns; prior to that, patient had been followed previously by [facility] telephonic CM.  HIPAA compliant voicemail message left on patient's answering machine, asking her to return my call [name] in-home visit can be scheduled.  Plan: If I do not hear back form [name], [facility] CM telephone outreach will be made later this week.  [name], RN, BSN, [facility] Care Management  [phone]

## 2016-02-14 ENCOUNTER — Telehealth: Payer: Self-pay | Admitting: *Deleted

## 2016-02-14 DIAGNOSIS — G20 Parkinson's disease: Secondary | ICD-10-CM | POA: Diagnosis not present

## 2016-02-14 DIAGNOSIS — R441 Visual hallucinations: Secondary | ICD-10-CM | POA: Diagnosis not present

## 2016-02-14 DIAGNOSIS — M545 Low back pain: Secondary | ICD-10-CM | POA: Diagnosis not present

## 2016-02-14 DIAGNOSIS — F0280 Dementia in other diseases classified elsewhere without behavioral disturbance: Secondary | ICD-10-CM | POA: Diagnosis not present

## 2016-02-14 DIAGNOSIS — M6281 Muscle weakness (generalized): Secondary | ICD-10-CM | POA: Diagnosis not present

## 2016-02-14 DIAGNOSIS — L03115 Cellulitis of right lower limb: Secondary | ICD-10-CM | POA: Diagnosis not present

## 2016-02-14 NOTE — Telephone Encounter (Signed)
Patient notified and appointment made for tomorrow at 2:30. Spoke with Renee with [facility], while at patient's home.

## 2016-02-14 NOTE — Telephone Encounter (Signed)
She needs an appointment to make sure it's just from her iron and not blood in her stools again.

## 2016-02-14 NOTE — Telephone Encounter (Signed)
Patient called and stated that she wanted you to know that she was having black looking stools. Started 3-4 days ago. Not a lot. Please Advise.

## 2016-02-15 ENCOUNTER — Other Ambulatory Visit: Payer: Self-pay | Admitting: Licensed Clinical Social Worker

## 2016-02-15 ENCOUNTER — Encounter: Payer: Self-pay | Admitting: Internal Medicine

## 2016-02-15 ENCOUNTER — Ambulatory Visit (INDEPENDENT_AMBULATORY_CARE_PROVIDER_SITE_OTHER): Payer: Medicare Other | Admitting: Internal Medicine

## 2016-02-15 VITALS — BP 120/80 | HR 79 | Temp 98.5°F | Wt 172.0 lb

## 2016-02-15 DIAGNOSIS — R441 Visual hallucinations: Secondary | ICD-10-CM | POA: Diagnosis not present

## 2016-02-15 DIAGNOSIS — K921 Melena: Secondary | ICD-10-CM | POA: Diagnosis not present

## 2016-02-15 DIAGNOSIS — M6281 Muscle weakness (generalized): Secondary | ICD-10-CM | POA: Diagnosis not present

## 2016-02-15 DIAGNOSIS — L03115 Cellulitis of right lower limb: Secondary | ICD-10-CM | POA: Diagnosis not present

## 2016-02-15 DIAGNOSIS — D509 Iron deficiency anemia, unspecified: Secondary | ICD-10-CM

## 2016-02-15 DIAGNOSIS — R1903 Right lower quadrant abdominal swelling, mass and lump: Secondary | ICD-10-CM | POA: Insufficient documentation

## 2016-02-15 DIAGNOSIS — G20 Parkinson's disease: Secondary | ICD-10-CM | POA: Diagnosis not present

## 2016-02-15 DIAGNOSIS — M545 Low back pain: Secondary | ICD-10-CM | POA: Diagnosis not present

## 2016-02-15 DIAGNOSIS — F0280 Dementia in other diseases classified elsewhere without behavioral disturbance: Secondary | ICD-10-CM | POA: Diagnosis not present

## 2016-02-15 NOTE — Patient Instructions (Signed)
We will call you with your lab results. If your blood count has dropped significantly, I will send you to the hospital. If it is stable, we will evaluate further with a CAT scan.  If you develop outright bleeding from your rectum, please call EMS immediately.

## 2016-02-15 NOTE — Patient Outreach (Signed)
[facility]) Care Management  02/15/2016  [name] 24-Jul-1941 [id number]   Assessment-CSW completed outreach attempt today. CSW unable to reach patient successfully because phone continuously rang. CSW unable to leave a HIPPA compliant voice message.  Plan-CSW will complete additional outreach within one week.  [name], BSW, MSW, [email or web address] Phone: [phone] Fax: [phone]

## 2016-02-15 NOTE — Patient Outreach (Signed)
[facility]) Care Management  02/15/2016  [name] 02/21/1941 [id number]   Assessment- CSW received letter from Adult Protective Services on 02/15/16 stating that patient has been visited and an evaluation was completed. The letter states that it has been determined that she has the capacity (the ability to make decisions on his/her own behalf) and has refused services. Adult Protective Services has now closed case.  Plan-CSW will update PCP on this decision.  [name], BSW, MSW, [email or web address] Phone: [phone] Fax: [phone]

## 2016-02-15 NOTE — Progress Notes (Signed)
Location:  [facility] clinic Provider: Harvy Riera L. Mariea Clonts, D.O., C.M.D.  Code Status: DNR Goals of Care:  Advanced Directives 02/15/2016  Does patient have an advance directive? Yes  Type of Advance Directive Out of facility DNR (pink MOST or yellow form)  Copy of advanced directive(s) in chart? Yes  Would patient like information on creating an advanced directive? -  Pre-existing out of facility DNR order (yellow form or pink MOST form) Pink MOST form placed in chart (order not valid for inpatient use)     Chief Complaint  Patient presents with  . Acute Visit    blood in stool    [name] with h/o upper GI bleed, gastritis, cameron erosions, gastric ulcer seen today for an acute visit for dark stools.  She is on iron.  She reports that her stools were stickier and darker than usual, but at first she thought it was from eating too much chocolate so she didn't report it (3 wks ago), but it persisted and she saw some red in her stools the past few days.  She has sometimes had some abdominal pain, but not much.  She has an umbilical hernia.  She has chronic constipation and some hemorrhoids also.  Past Medical History  Diagnosis Date  . Parkinson's disease   . Hypertension   . Arthritis   . Anemia   . Cellulitis of lower leg 12/24/2011  . Chronic systolic heart failure ([place])   . Acute on chronic systolic heart failure ([place])   . Cellulitis and abscess of leg, except foot   . Hypotension, unspecified   . Helicobacter pylori (H. pylori)   . CHF (congestive heart failure) ([place])   . Anxiety   . Cataract left  . Glaucoma left  . GERD (gastroesophageal reflux disease)   . Hypercholesterolemia   . Chronic bronchitis ([place])     "usually get it q yr"  . Sleep apnea     "had test years ago; insurance wouldn't pay for mask so I never had one" (04/27/2015)  . Hypothyroidism   . History of blood transfusion     "don't remember why"  . History of bleeding peptic ulcer   .  Stroke [facility]) X 3    "that  was the reason I got Parkinson's"    Past Surgical History  Procedure Laterality Date  . Hemiarthroplasty hip Right 09/24/2007    Archie Endo 01/23/2011  . Thyroidectomy    . Esophagogastroduodenoscopy  09/26/2011    Procedure: ESOPHAGOGASTRODUODENOSCOPY (EGD);  Surgeon: Zenovia Jarred, MD;  Location: [facility] ENDOSCOPY;  Service: Gastroenterology;  Laterality: N/A;  To be done at bedside.  . Esophagogastroduodenoscopy N/A 09/30/2013    Procedure: ESOPHAGOGASTRODUODENOSCOPY (EGD);  Surgeon: [name], MD;  Location: [facility];  Service: Endoscopy;  Laterality: N/A;  . Esophagogastroduodenoscopy N/A 11/25/2013    Procedure: ESOPHAGOGASTRODUODENOSCOPY (EGD);  Surgeon: [name], MD;  Location: [facility] END[facility];  Service: Endoscopy;  Laterality: N/A;  . Tonsillectomy    . Nissen fundoplication      "had OR for GERD"  . Tendon repair      Gluteus medius [facility] Endo 01/23/2011  . Back surgery    . Kyphoplasty  06/2009    T12/notes 07/17/2009    Allergies  Allergen Reactions  . Codeine Other (See Comments)    Unknown allergic reaction  . Penicillins Other (See Comments)    Pt passed out in doctor's office after penicillin dose      Medication List  This list is accurate as of: 02/15/16  2:53 PM.  Always use your most recent med list.               carbidopa-levodopa 25-100 MG tablet  Commonly known as:  SINEMET IR  Take 1 at 6a, 1 1/2 pills at [place], 1 at noon, 1 1/2 pills at 3p, 1 at 6pm, 1 1/2 pills at 9pm     docusate sodium 100 MG capsule  Commonly known as:  COLACE  Take 100 mg by mouth daily.     furosemide 40 MG tablet  Commonly known as:  LASIX  Take 40 mg by mouth daily. Patient to increase to BID x 3 days then resume once daily on 02-16-16     Iron 325 (65 Fe) MG Tabs  Take 1 capsule by mouth daily.     latanoprost 0.005 % ophthalmic solution  Commonly known as:  XALATAN  Place 1 drop into the left eye daily at 6 PM.     LORazepam 0.5 MG tablet    Commonly known as:  ATIVAN  Take 1 tablet (0.5 mg total) by mouth 2 (two) times daily as needed for anxiety.     Melatonin 5 MG Tabs  Take one tablet by mouth one hour before bedtime for sleep     multivitamin with minerals tablet  Take 1 tablet by mouth daily.     oxyCODONE 5 MG immediate release tablet  Commonly known as:  Oxy IR/ROXICODONE  Take 1 tablet (5 mg total) by mouth 2 (two) times daily as needed for severe pain.     pramipexole 1 MG tablet  Commonly known as:  MIRAPEX  Take 1 tablet (1 mg total) by mouth 3 (three) times daily.     SYSTANE OP  Place 1 drop into the right eye daily at 6 PM.     Vitamin D 2000 units Caps  Take 1 capsule (2,000 Units total) by mouth daily.        Review of Systems:  Review of Systems  Constitutional: Negative for fever, chills and malaise/fatigue.  HENT: Negative for congestion.   Respiratory: Positive for shortness of breath. Negative for cough and wheezing.   Cardiovascular: Negative for chest pain, palpitations and leg swelling.  Gastrointestinal: Positive for abdominal pain, constipation, blood in stool and melena. Negative for heartburn, nausea, vomiting and diarrhea.  Genitourinary: Negative for dysuria.  Musculoskeletal: Negative for falls.  Skin: Negative for rash.  Neurological: Positive for tremors.       Shuffling gait with rollator walker  Psychiatric/Behavioral: Positive for hallucinations. Negative for memory loss.    Health Maintenance  Topic Date Due  . TETANUS/TDAP  06/07/1959  . COLONOSCOPY  06/06/1990  . ZOSTAVAX  06/06/2000  . DEXA SCAN  06/06/2005  . MAMMOGRAM  11/22/2011  . INFLUENZA VACCINE  04/23/2016  . PNA vac Low Risk Adult  Completed    Physical Exam: Filed Vitals:   02/15/16 1435  BP: 120/80  Pulse: 79  Temp: 98.5 F (36.9 C)  TempSrc: Oral  Weight: 172 lb (78.019 kg)  SpO2: 97%   Body mass index is 29.51 kg/(m^2). Physical Exam  Constitutional: She is oriented to person, place,  and time. She appears well-developed and well-nourished. No distress.  Cardiovascular: Normal rate, regular rhythm, normal heart sounds and intact distal pulses.   Pulmonary/Chest: Effort normal and breath sounds normal. She has no rales.  Abdominal: Soft. Bowel sounds are normal. She exhibits mass. She exhibits no distension.  There is tenderness. There is no rebound and no guarding. A hernia is present.  Right lower quadrant mass/hard area, slightly tender  Genitourinary: Guaiac positive stool.  Dark brown/burgundy stool, heme positive  Musculoskeletal: Normal range of motion.  Neurological: She is alert and oriented to person, place, and time.  Skin: Skin is warm and dry.  Psychiatric: She has a normal mood and affect.    Labs reviewed: Basic Metabolic Panel:  Recent Labs  04/23/15 2025  04/27/15 2112  11/20/15 1451 12/29/15 1017 01/20/16 0036  NA  --   < >  --   < > 139 141 145  K  --   < >  --   < > 4.5 4.1 4.6  CL  --   < >  --   < > 100 101 114*  CO2  --   < >  --   < > 24 23 22   GLUCOSE  --   < >  --   < > 104* 77 103*  BUN  --   < >  --   < > 18 17 19   CREATININE 0.98  < >  --   < > 0.76 0.85 0.86  CALCIUM  --   < >  --   < > 8.9 9.0 8.6*  MG 1.9  --  2.1  --   --   --   --   PHOS 3.7  --   --   --   --   --   --   TSH  --   --  2.277  --   --   --   --   < > = values in this interval not displayed. Liver Function Tests:  Recent Labs  04/03/15 1350 04/24/15 0609 04/27/15 1410  AST 18 30 24   ALT 7 6* 24  ALKPHOS 66 52 67  BILITOT 0.4 1.0 0.8  PROT 5.8* 5.3* 6.2*  ALBUMIN 3.8 2.9* 3.4*   No results for input(s): LIPASE, AMYLASE in the last 8760 hours. No results for input(s): AMMONIA in the last 8760 hours. CBC:  Recent Labs  04/27/15 1410 04/28/15 0534 07/06/15 1359 01/20/16 0036  WBC 10.0 7.8 7.9 8.1  NEUTROABS 7.6  --  5.8 6.0  HGB 14.0 13.0  --  10.2*  HCT 42.7 40.0 36.8 33.3*  MCV 85.1 86.2 84 80.8  PLT 147* 135* 187 147*   Lipid Panel: No  results for input(s): CHOL, HDL, LDLCALC, TRIG, CHOLHDL, LDLDIRECT in the last 8760 hours. No results found for: HGBA1C  Procedures since last visit: Dg Tibia/fibula Right  01/20/2016  CLINICAL DATA:  Acute onset of right leg pain and weakness. Initial encounter. EXAM: RIGHT TIBIA AND FIBULA - 2 VIEW COMPARISON:  None. FINDINGS: There is no evidence of fracture or dislocation. The tibia and fibula appear intact. Visualized joint spaces are preserved. No knee joint effusion is seen. The ankle mortise is incompletely assessed, but appears grossly unremarkable. Mild anterior soft tissue swelling is suggested. IMPRESSION: No evidence of fracture or dislocation. Electronically Signed   By: Garald Balding M.D.   On: 01/20/2016 01:42    Assessment/Plan 1. Melena -pt's rectal exam was heme positive -she is hemodynamically stable with normal vitals today - labs run today: - CBC with Differential - Basic metabolic panel  2. Anemia, iron deficiency -if significant drop in h/h or acute renal failure on labs, will send to ED for emergent evaluation; if stable, will arrange CT abdomen/pelvis  and GI f/u  3.  Right lower quadrant abdominal swelling/mass/lump -hard, slightly tender area present -will obtain CT abdomen/pelvis outpatient if labs are stable today and GI f/u -may be hard stool I'm feeling rather than any true mass  Labs/tests ordered:   Orders Placed This Encounter  Procedures  . CBC with Differential  . Basic metabolic panel    Next appt:  03/29/2016  Sebastain Fishbaugh L. Eldred Lievanos, D.O. [facility] [address], [place] [address] Cell Phone (Mon-Fri 8am-5pm):  [phone] On Call:  [phone] & follow prompts after 5pm & weekends Office Phone:  [phone] Office Fax:  [phone]

## 2016-02-16 ENCOUNTER — Encounter: Payer: Self-pay | Admitting: *Deleted

## 2016-02-16 ENCOUNTER — Other Ambulatory Visit: Payer: Self-pay | Admitting: Internal Medicine

## 2016-02-16 ENCOUNTER — Other Ambulatory Visit: Payer: Self-pay | Admitting: *Deleted

## 2016-02-16 DIAGNOSIS — D509 Iron deficiency anemia, unspecified: Secondary | ICD-10-CM

## 2016-02-16 DIAGNOSIS — R1903 Right lower quadrant abdominal swelling, mass and lump: Secondary | ICD-10-CM

## 2016-02-16 DIAGNOSIS — K921 Melena: Secondary | ICD-10-CM

## 2016-02-16 LAB — CBC WITH DIFFERENTIAL/PLATELET
Basophils Absolute: 0 10*3/uL (ref 0.0–0.2)
Basos: 1 %
EOS (ABSOLUTE): 0.2 10*3/uL (ref 0.0–0.4)
Eos: 3 %
Hematocrit: 34.4 % (ref 34.0–46.6)
Hemoglobin: 10.7 g/dL — ABNORMAL LOW (ref 11.1–15.9)
Immature Grans (Abs): 0 10*3/uL (ref 0.0–0.1)
Immature Granulocytes: 0 %
Lymphocytes Absolute: 1.1 10*3/uL (ref 0.7–3.1)
Lymphs: 14 %
MCH: 24.5 pg — ABNORMAL LOW (ref 26.6–33.0)
MCHC: 31.1 g/dL — ABNORMAL LOW (ref 31.5–35.7)
MCV: 79 fL (ref 79–97)
Monocytes Absolute: 0.7 10*3/uL (ref 0.1–0.9)
Monocytes: 9 %
Neutrophils Absolute: 5.8 10*3/uL (ref 1.4–7.0)
Neutrophils: 73 %
Platelets: 185 10*3/uL (ref 150–379)
RBC: 4.36 x10E6/uL (ref 3.77–5.28)
RDW: 18.2 % — ABNORMAL HIGH (ref 12.3–15.4)
WBC: 7.8 10*3/uL (ref 3.4–10.8)

## 2016-02-16 LAB — BASIC METABOLIC PANEL
BUN/Creatinine Ratio: 24 (ref 12–28)
BUN: 21 mg/dL (ref 8–27)
CO2: 19 mmol/L (ref 18–29)
Calcium: 9 mg/dL (ref 8.7–10.3)
Chloride: 104 mmol/L (ref 96–106)
Creatinine, Ser: 0.89 mg/dL (ref 0.57–1.00)
GFR calc Af Amer: 73 mL/min/{1.73_m2} (ref >59)
GFR calc non Af Amer: 64 mL/min/{1.73_m2} (ref >59)
Glucose: 91 mg/dL (ref 65–99)
Potassium: 4.6 mmol/L (ref 3.5–5.2)
Sodium: 142 mmol/L (ref 134–144)

## 2016-02-16 NOTE — Patient Outreach (Signed)
[facility]) Care Management [place] Telephone Outreach 02/16/2016  [name] 1941-04-02 [id number]  Successful telephone outreach to [name], 75 y/o female followed by [facility] on referral from [facility] after ED visit 01/19/16 for safety concerns; prior to that, patient had been followed previously by [facility] telephonic CM. HIPAA verified.  Today, [name] states that she is "doing okay," but "feels a little weak."  [name] reports that she visited her PCP yesterday and "they found blood in my stool."  [name] states that she will be having a CT scan on February 28, 2016 to evaluate "where the blood is coming from."  [name] states that she will use [employer] bus to get to the CT scan, stating that she may "get my family to take me."  [name] states that she uses her walker 100% of the time for assistance with ambulating, and denies any recent falls.  [name] states that she takes her medicines as they are prescribed, and we went over each medication today; [name] was able to state the general purpose of each medication she has been prescribed, and verbalized an accurate medication schedule; patient reports today that she manages her own medications.  [name] also reported that she attended a recent visit with her neurologist, stating, "she didn't really change anything, she wants to see me again in 4 months."    [name] stated today that she "has never been told by a doctor" that she has heart failure.  We discussed heart failure zones again today, and the patient stated that she had no shortness of breath, weight gain, or ankle/foot swelling today.  [name] acknowledged that she "sometimes" does experience shortness of breath, slight weight gain, and occasional swelling in her feet/ ankles.  [name] reports that these symptoms "just go away," when she experiences them.  Today, [name] reported a weight of 170 pounds, stating, "that's usually where I  stay."  We thoroughly discussed symptoms of heart failure.  [name] already monitors and records her daily weights; I asked her to continue doing this , and also to write down every episode of shortness of breath she experiences, which she agreed to do.  [name] reports that she continues to have home health RN coming in to her home.  [name] states that received [employer] and that her family assists with meal provision as well.  Today, [name] denies having a problem with nutrition or decreased appetite.  [name] and I were able to schedule a [place] home visit for next month, and we discussed the difference between home health services and [place].  I made sure [name] had my direct contact information, as well as the 24-hour [facility] nurse line phone number.  I let [name] know that I would check in again with her through telephone outreach prior to our scheduled [place] in home visit, and she was agreeable to this plan. [name] denies further questions, concerns, issues, or problems today.  Plan: [name] will begin weighing herself every day and will write down her daily weights. [name] will write down any episodes of shortness of breath that she experiences. [name] will continue taking her medications as they are prescribed and will keep her scheduled provider appointments. [name] will notify her providers for any concerns, issues, problems, or questions that arise. [place] telephone outreach in  2 weeks, with [place] home visit scheduled for next month.  [name], RN, BSN, [employer] [facility] Care Management  [phone]

## 2016-02-20 ENCOUNTER — Other Ambulatory Visit: Payer: Self-pay | Admitting: Licensed Clinical Social Worker

## 2016-02-20 DIAGNOSIS — G20 Parkinson's disease: Secondary | ICD-10-CM | POA: Diagnosis not present

## 2016-02-20 DIAGNOSIS — M6281 Muscle weakness (generalized): Secondary | ICD-10-CM | POA: Diagnosis not present

## 2016-02-20 DIAGNOSIS — L03115 Cellulitis of right lower limb: Secondary | ICD-10-CM | POA: Diagnosis not present

## 2016-02-20 DIAGNOSIS — F0280 Dementia in other diseases classified elsewhere without behavioral disturbance: Secondary | ICD-10-CM | POA: Diagnosis not present

## 2016-02-20 DIAGNOSIS — M545 Low back pain: Secondary | ICD-10-CM | POA: Diagnosis not present

## 2016-02-20 DIAGNOSIS — R441 Visual hallucinations: Secondary | ICD-10-CM | POA: Diagnosis not present

## 2016-02-20 NOTE — Patient Outreach (Signed)
[facility]) Care Management  02/20/2016  [name] 11-Mar-1941 [id number]   Assessment- CSW completed outreach to patient. She states that she is feeling better. CSW questioned if she would be agreeable to home visit tomorrow and she states yes.  Plan-CSW will complete home visit tomorrow to review community resources again.  [name], BSW, MSW, [email or web address] Phone: [phone] Fax: [phone]

## 2016-02-21 ENCOUNTER — Encounter: Payer: Self-pay | Admitting: Licensed Clinical Social Worker

## 2016-02-21 ENCOUNTER — Other Ambulatory Visit: Payer: Self-pay | Admitting: Licensed Clinical Social Worker

## 2016-02-21 DIAGNOSIS — G20 Parkinson's disease: Secondary | ICD-10-CM | POA: Diagnosis not present

## 2016-02-21 DIAGNOSIS — M6281 Muscle weakness (generalized): Secondary | ICD-10-CM | POA: Diagnosis not present

## 2016-02-21 DIAGNOSIS — R441 Visual hallucinations: Secondary | ICD-10-CM | POA: Diagnosis not present

## 2016-02-21 DIAGNOSIS — M545 Low back pain: Secondary | ICD-10-CM | POA: Diagnosis not present

## 2016-02-21 DIAGNOSIS — F0280 Dementia in other diseases classified elsewhere without behavioral disturbance: Secondary | ICD-10-CM | POA: Diagnosis not present

## 2016-02-21 DIAGNOSIS — L03115 Cellulitis of right lower limb: Secondary | ICD-10-CM | POA: Diagnosis not present

## 2016-02-21 NOTE — Patient Outreach (Signed)
[facility]) Care Management  [facility] Social Work  02/21/2016  [name] 05/13/1941 [id number]   Encounter Medications:  Outpatient Encounter Prescriptions as of 02/21/2016  Medication Sig Note  . carbidopa-levodopa (SINEMET IR) 25-100 MG tablet Take 1 at 6a, 1 1/2 pills at 9a, 1 at noon, 1 1/2 pills at 3p, 1 at 6pm, 1 1/2 pills at 9pm   . Cholecalciferol (VITAMIN D) 2000 UNITS CAPS Take 1 capsule (2,000 Units total) by mouth daily. (Patient not taking: Reported on 02/16/2016) 02/16/2016: Taking multi-vitamin instead, per pr. report  . docusate sodium (COLACE) 100 MG capsule Take 100 mg by mouth daily.    . Ferrous Sulfate (IRON) 325 (65 FE) MG TABS Take 1 capsule by mouth daily. (Patient not taking: Reported on 02/16/2016) 02/16/2016: Prescription ran out according to patient report  . furosemide (LASIX) 40 MG tablet Take 40 mg by mouth daily. Patient to increase to BID x 3 days then resume once daily on 02-16-16   . latanoprost (XALATAN) 0.005 % ophthalmic solution Place 1 drop into the left eye daily at 6 PM.    . LORazepam (ATIVAN) 0.5 MG tablet Take 1 tablet (0.5 mg total) by mouth 2 (two) times daily as needed for anxiety.   . Melatonin 5 MG TABS Take one tablet by mouth one hour before bedtime for sleep   . Multiple Vitamins-Minerals (MULTIVITAMIN WITH MINERALS) tablet Take 1 tablet by mouth daily.   Marland Kitchen oxyCODONE (OXY IR/ROXICODONE) 5 MG immediate release tablet Take 1 tablet (5 mg total) by mouth 2 (two) times daily as needed for severe pain.   Vladimir Faster Glycol-Propyl Glycol (SYSTANE OP) Place 1 drop into the right eye daily at 6 PM.   . pramipexole (MIRAPEX) 1 MG tablet Take 1 tablet (1 mg total) by mouth 3 (three) times daily.    No facility-administered encounter medications on file as of 02/21/2016.    Functional Status:  In your present state of health, do you have any difficulty performing the following activities: 02/16/2016 01/30/2016  Hearing? - N  Vision? - N   Difficulty concentrating or making decisions? - N  Walking or climbing stairs? - Y  Dressing or bathing? - Y  Doing errands, shopping? - Y  Preparing Food and eating ? Y -  Using the Toilet? N -  In the past six months, have you accidently leaked urine? N -  Do you have problems with loss of bowel control? N -  Managing your Medications? N -  Managing your Finances? N -  Housekeeping or managing your Housekeeping? N -    Fall/Depression Screening:  PHQ 2/9 Scores 02/21/2016 01/30/2016 01/26/2016 01/16/2016 12/29/2015 12/15/2015 11/13/2015  PHQ - 2 Score 0 0 0 0 0 0 0    Assessment: CSW completed home visit on 02/21/16. Patient reports that her brother is picking her up later today to transport her to a dentist appointment. Patient shares that she has two teeth filled and will get two more teeth filled. Patient reports that she went a few years without going to the dentist but is now going more often because she needs work. Patient denies being in any pain or discomfort right now. Patient shares that she fell this past Sunday. She states that she fell after using the bathroom and fell right in the doorway of her bedroom directly afterward. Patient reports that her buttocks but did not injure her hips. Patient shares that she used her life alert to contact EMS and  they came to assist her. Patient denies any weakness at this time. CSW spent time reviewing personal care services with patient. She has not heard back from [place] her previous caregiver. She reports that she is not willing to pay over $10.00 per hour for a caregiver. CSW reminded her that she is on two different waiting list that CSW put her on. However, these wait times are lengthy. Patient reports that her appetite is "real good." Patient denies any sadness or depression. She shares that [employer] continue to deliver her meals. Patient provide patient with a list of Senior Resources and reviewed them with her. Patient reports that she does not  wish to go to a [facility], [employer], or [place]. Patient does not wish to change her PCP and that is why she is not willing to do [facility]. Patient questioned about finances and she replied "I'm not hurting. I'm okay with it." Patient shares that she is going to church weekly using [employer] but did not go this past week because she fell. Patient reports that she writes her weight down on a notepad. She weighed 172 this morning. Patient reports that her niece in law is coming daily during the morning and late evening. CSW completed call to brother but was unable to reach him or leave a voice message because voice mail box had not been set up yet. Patient stated that a program that her PCP referred to her is assisting her with gaining new floors in bathroom, new sink in kitchen, new toilet in bathroom and putting in place a step at the end of her ramp outside her house. Patient couldn't name the program but they were working on her house that day.    THN CM Care Plan Problem Three        Most Recent Value   THN Long Term Goal Met Date  02/21/16   Interventions for Problem Three Long Term Goal  Patient was successfully evaluated by APS and found that patient can provide care to herself and make her own decisions for herself. Case is closed.   THN CM Short Term Goal #1 (0-30 days)  Patient will be educated on personal care resources again within 30 days   THN CM Short Term Goal #1 Start Date  02/21/16   Interventions for Short Term Goal #1  CSW has put patient on two different waiting list for [employer] and [employer] and [employer]. CSW has mailed out resources to patient's residence and will complete review of resources with patient.   THN CM Short Term Goal #2 Met Date  02/21/16   Interventions for Short Term Goal #2  Goal met. Patient does not wish to participate in socialization activities.   THN CM Short Term Goal #3 (0-30 days)  Patient will gain additional senior resources  within 30 days   [facility]  CM Short Term Goal #3 Start Date  02/21/16   Interventions for Short Term Goal #3  CSW will complete home visit and will educate her on available senior resources within [facility]. CSW will review these multiple times with patient as needed.      Plan: CSW will continue to follow patient and provide social work assistance. CSW will await for return call from patient's brother. CSW will make next outreach within three weeks.   [name], BSW, MSW, [email or web address] Phone: [phone] Fax: [phone]

## 2016-02-22 ENCOUNTER — Encounter: Payer: Self-pay | Admitting: Internal Medicine

## 2016-02-22 ENCOUNTER — Ambulatory Visit (INDEPENDENT_AMBULATORY_CARE_PROVIDER_SITE_OTHER): Payer: Medicare Other | Admitting: Internal Medicine

## 2016-02-22 ENCOUNTER — Telehealth: Payer: Self-pay

## 2016-02-22 VITALS — BP 160/80 | HR 91 | Temp 97.5°F | Wt 175.1 lb

## 2016-02-22 DIAGNOSIS — R21 Rash and other nonspecific skin eruption: Secondary | ICD-10-CM | POA: Diagnosis not present

## 2016-02-22 DIAGNOSIS — M545 Low back pain: Secondary | ICD-10-CM | POA: Diagnosis not present

## 2016-02-22 DIAGNOSIS — G20A1 Parkinson's disease without dyskinesia, without mention of fluctuations: Secondary | ICD-10-CM

## 2016-02-22 DIAGNOSIS — I872 Venous insufficiency (chronic) (peripheral): Secondary | ICD-10-CM

## 2016-02-22 DIAGNOSIS — D692 Other nonthrombocytopenic purpura: Secondary | ICD-10-CM

## 2016-02-22 DIAGNOSIS — I8311 Varicose veins of right lower extremity with inflammation: Secondary | ICD-10-CM

## 2016-02-22 DIAGNOSIS — H578 Other specified disorders of eye and adnexa: Secondary | ICD-10-CM | POA: Diagnosis not present

## 2016-02-22 DIAGNOSIS — H5789 Other specified disorders of eye and adnexa: Secondary | ICD-10-CM

## 2016-02-22 DIAGNOSIS — I8312 Varicose veins of left lower extremity with inflammation: Secondary | ICD-10-CM | POA: Diagnosis not present

## 2016-02-22 DIAGNOSIS — G20 Parkinson's disease: Secondary | ICD-10-CM

## 2016-02-22 DIAGNOSIS — L03115 Cellulitis of right lower limb: Secondary | ICD-10-CM | POA: Diagnosis not present

## 2016-02-22 DIAGNOSIS — R441 Visual hallucinations: Secondary | ICD-10-CM | POA: Diagnosis not present

## 2016-02-22 DIAGNOSIS — M6281 Muscle weakness (generalized): Secondary | ICD-10-CM | POA: Diagnosis not present

## 2016-02-22 DIAGNOSIS — F0280 Dementia in other diseases classified elsewhere without behavioral disturbance: Secondary | ICD-10-CM | POA: Diagnosis not present

## 2016-02-22 MED ORDER — VITAMIN D 50 MCG (2000 UT) PO CAPS: 1.0000 | ORAL_CAPSULE | Freq: Every day | ORAL | Status: DC

## 2016-02-22 MED ORDER — NYSTATIN-TRIAMCINOLONE 100000-0.1 UNIT/GM-% EX OINT: 1.0000 "application " | TOPICAL_OINTMENT | Freq: Two times a day (BID) | CUTANEOUS | Status: DC

## 2016-02-22 MED ORDER — HYDROCORTISONE 1 % EX LOTN: 1.0000 "application " | TOPICAL_LOTION | Freq: Two times a day (BID) | CUTANEOUS | Status: DC

## 2016-02-22 MED ORDER — FERROUS SULFATE 325 (65 FE) MG PO TBEC: 325.0000 mg | DELAYED_RELEASE_TABLET | Freq: Every day | ORAL | Status: DC

## 2016-02-22 NOTE — Telephone Encounter (Deleted)
Patient was calling to confirm that we faxed patient assistance forms. Patient informed that we faxed these forms on 02-06-16, we will call patient once medication is received at the office.

## 2016-02-22 NOTE — Telephone Encounter (Addendum)
Previous note was a error.  Betsy from [employer] was calling to inform Dr.Reed patient is currently at 175 lbs. 2 days ago patient was 173.4. Patient with increased swelling in left lower leg. Patient with little bite marks on arms and legs -? Source, arms and legs are very itchy. Patient's right eye is swollen and red.   Side note- patient had a home visit with [facility] yesterday, please advise

## 2016-02-22 NOTE — Progress Notes (Signed)
Location:  [facility] clinic Provider:  Aaran Enberg L. Mariea Clonts, D.O., C.M.D.  Code Status: DNR Goals of Care:  Advanced Directives 02/21/2016  Does patient have an advance directive? Yes  Type of Advance Directive Out of facility DNR (pink MOST or yellow form)  Copy of advanced directive(s) in chart? -  Would patient like information on creating an advanced directive? No - patient declined information  Pre-existing out of facility DNR order (yellow form or pink MOST form) -     Chief Complaint  Patient presents with  . Acute Visit    concerns about medications, rash, weight loss    [name] seen today for acute visit.   Weight stable per our scale.  Now has a rash noted yesterday by [facility] and Betsy from [facility] noticed rash.  Right eye with redness and swelling better today.  Says the places on her arms are itchy for periods of time, then quit.  Most look like senile purpura.  Has a couple red spots though that are raised also all on arms, then one on right thigh.  Says she helped a relative with a vine in a rosebush.  Right eye is puffy and red around it.    Left leg is more swollen.  Had a fall on Sunday where she was getting up from the commode and started to suddenly walk fast backwards and fell on her right hip.  Has a large bruise.  Oddly right leg is not swollen, only left.  It's painful to walk so less mobile.    Having freezing spells.  Had one here today where she could not move.    Past Medical History  Diagnosis Date  . Parkinson's disease   . Hypertension   . Arthritis   . Anemia   . Cellulitis of lower leg 12/24/2011  . Chronic systolic heart failure ([place])   . Acute on chronic systolic heart failure ([place])   . Cellulitis and abscess of leg, except foot   . Hypotension, unspecified   . Helicobacter pylori (H. pylori)   . CHF (congestive heart failure) ([place])   . Anxiety   . Cataract left  . Glaucoma left  . GERD (gastroesophageal reflux disease)   .  Hypercholesterolemia   . Chronic bronchitis ([place])     "usually get it q yr"  . Sleep apnea     "had test years ago; insurance wouldn't pay for mask so I never had one" (04/27/2015)  . Hypothyroidism   . History of blood transfusion     "don't remember why"  . History of bleeding peptic ulcer   . Stroke [facility]) X 3    "that  was the reason I got Parkinson's"    Past Surgical History  Procedure Laterality Date  . Hemiarthroplasty hip Right 09/24/2007    Archie Endo 01/23/2011  . Thyroidectomy    . Esophagogastroduodenoscopy  09/26/2011    Procedure: ESOPHAGOGASTRODUODENOSCOPY (EGD);  Surgeon: Zenovia Jarred, MD;  Location[facility] ENDOSCOPY;  Service: Gastroenterology;  Laterality: N/A;  To be done at bedside.  . Esophagogastroduodenoscopy N/A 09/30/2013    Procedure: ESOPHAGOGASTRODUODENOSCOPY (EGD);  Surgeon: [name], MD;  Location: [facility];  Service: Endoscopy;  Laterality: N/A;  . Esophagogastroduodenoscopy N/A 11/25/2013    Procedure: ESOPHAGOGASTRODUODENOSCOPY (EGD);  Surgeon: [name], MD;  Location: [facility] END[facility];  Service: Endoscopy;  Laterality: N/A;  . Tonsillectomy    . Nissen fundoplication      "had OR for GERD"  .  Tendon repair      Gluteus medius Archie Endo 01/23/2011  . Back surgery    . Kyphoplasty  06/2009    T12/notes 07/17/2009    Allergies  Allergen Reactions  . Codeine Other (See Comments)    Unknown allergic reaction  . Penicillins Other (See Comments)    Pt passed out in doctor's office after penicillin dose      Medication List       This list is accurate as of: 02/22/16  2:55 PM.  Always use your most recent med list.               carbidopa-levodopa 25-100 MG tablet  Commonly known as:  SINEMET IR  Take 1 at 6a, 1 1/2 pills at [place], 1 at noon, 1 1/2 pills at 3p, 1 at 6pm, 1 1/2 pills at 9pm     docusate sodium 100 MG capsule  Commonly known as:  COLACE  Take 100 mg by mouth daily.     ferrous sulfate 325 (65 FE) MG EC tablet  Take 325 mg by mouth  daily.     furosemide 40 MG tablet  Commonly known as:  LASIX  Take 40 mg by mouth daily. Patient to increase to BID x 3 days then resume once daily on 02-16-16     latanoprost 0.005 % ophthalmic solution  Commonly known as:  XALATAN  Place 1 drop into the left eye daily at 6 PM.     LORazepam 0.5 MG tablet  Commonly known as:  ATIVAN  Take 1 tablet (0.5 mg total) by mouth 2 (two) times daily as needed for anxiety.     Melatonin 5 MG Tabs  Take one tablet by mouth one hour before bedtime for sleep     multivitamin with minerals tablet  Take 1 tablet by mouth daily.     oxyCODONE 5 MG immediate release tablet  Commonly known as:  Oxy IR/ROXICODONE  Take 1 tablet (5 mg total) by mouth 2 (two) times daily as needed for severe pain.     pramipexole 1 MG tablet  Commonly known as:  MIRAPEX  Take 1 tablet (1 mg total) by mouth 3 (three) times daily.     SYSTANE OP  Place 1 drop into the right eye daily at 6 PM.        Review of Systems:  Review of Systems  Constitutional: Positive for malaise/fatigue. Negative for fever and chills.  HENT: Negative for congestion.   Eyes: Negative for blurred vision.  Respiratory: Positive for shortness of breath.   Cardiovascular: Positive for leg swelling. Negative for chest pain and palpitations.  Gastrointestinal: Positive for constipation. Negative for nausea, vomiting, abdominal pain, diarrhea, blood in stool and melena.  Genitourinary: Positive for urgency. Negative for dysuria, hematuria and flank pain.  Musculoskeletal: Positive for back pain and falls.  Skin: Positive for itching and rash.  Neurological: Positive for tremors and weakness. Negative for dizziness and loss of consciousness.  Endo/Heme/Allergies: Bruises/bleeds easily.  Psychiatric/Behavioral: Positive for depression, hallucinations and memory loss.    Health Maintenance  Topic Date Due  . TETANUS/TDAP  06/07/1959  . COLONOSCOPY  06/06/1990  . ZOSTAVAX  06/06/2000    . DEXA SCAN  06/06/2005  . MAMMOGRAM  11/22/2011  . INFLUENZA VACCINE  04/23/2016  . PNA vac Low Risk Adult  Completed    Physical Exam: Filed Vitals:   02/22/16 1432  BP: 160/80  Pulse: 91  Temp: 97.5 F (36.4 C)  TempSrc:  Oral  Weight: 175 lb 2 oz (79.436 kg)  SpO2: 97%   Body mass index is 30.05 kg/(m^2). Physical Exam  Constitutional: She is oriented to person, place, and time. She appears well-developed.  Cardiovascular: Normal rate and regular rhythm.   Pulmonary/Chest: Effort normal and breath sounds normal.  Abdominal: Soft. Bowel sounds are normal.  Musculoskeletal:  Dyskinesias present; had freezing spell where she could not move any part of her body or lift her head during the visit  Neurological: She is alert and oriented to person, place, and time.  Comes in wheelchair   Skin: Skin is warm and dry. There is erythema.  Of bilateral lower legs, venous stasis changes, no drainage or ulcers at present  Psychiatric: She has a normal mood and affect.    Labs reviewed: Basic Metabolic Panel:  Recent Labs  04/23/15 2025  04/27/15 2112  12/29/15 1017 01/20/16 0036 02/15/16 1506  NA  --   < >  --   < > 141 145 142  K  --   < >  --   < > 4.1 4.6 4.6  CL  --   < >  --   < > 101 114* 104  CO2  --   < >  --   < > 23 22 19   GLUCOSE  --   < >  --   < > 77 103* 91  BUN  --   < >  --   < > 17 19 21   CREATININE 0.98  < >  --   < > 0.85 0.86 0.89  CALCIUM  --   < >  --   < > 9.0 8.6* 9.0  MG 1.9  --  2.1  --   --   --   --   PHOS 3.7  --   --   --   --   --   --   TSH  --   --  2.277  --   --   --   --   < > = values in this interval not displayed. Liver Function Tests:  Recent Labs  04/03/15 1350 04/24/15 0609 04/27/15 1410  AST 18 30 24   ALT 7 6* 24  ALKPHOS 66 52 67  BILITOT 0.4 1.0 0.8  PROT 5.8* 5.3* 6.2*  ALBUMIN 3.8 2.9* 3.4*   No results for input(s): LIPASE, AMYLASE in the last 8760 hours. No results for input(s): AMMONIA in the last 8760  hours. CBC:  Recent Labs  04/27/15 1410 04/28/15 0534 07/06/15 1359 01/20/16 0036 02/15/16 1506  WBC 10.0 7.8 7.9 8.1 7.8  NEUTROABS 7.6  --  5.8 6.0 5.8  HGB 14.0 13.0  --  10.2*  --   HCT 42.7 40.0 36.8 33.3* 34.4  MCV 85.1 86.2 84 80.8 79  PLT 147* 135* 187 147* 185   Assessment/Plan 1. Rash -contact derm appearance on arms, plus bruising from itching--says she got into some poison ivy - hydrocortisone 1 % lotion; Apply 1 application topically 2 (two) times daily. 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 Parkinson's disease ([place]) -is advanced stage, has freezing regularly and frequent falls, still lives alone and won't agree to placement and also cannot afford to pay for personal care help; is on two wait lists for free help -  I'm not sure how she manages at all at this point  4. Eye swelling, right -suspect related to the poison ivy and dependent swelling -should resolve spontaneously -her right eye itself is fine  5. Venous stasis dermatitis of both lower extremities -has some chronic erythema of the legs with mild tenderness  -this is not always infected--cannot use compression hose b/c she does not have someone there to put them on and off--brother and other family do offer some help but have their own problems -unnaboots changed 2-3 times per week may be helpful to prevent infection and ulceration along with regular use of steroid/antifungal cream due to hygiene issues  Labs/tests ordered: No orders of the defined types were placed in this encounter.   Next appt:  03/29/2016   Zunaira Lamy L. Jaala Bohle, D.O. [facility] [address], [place] [address] Cell Phone (Mon-Fri 8am-5pm):  [phone] On Call:  [phone] & follow prompts after 5pm & weekends Office Phone:   [phone] Office Fax:  [phone]

## 2016-02-22 NOTE — Telephone Encounter (Signed)
Scheduled appointment for today. Patient will have to check with her brother to confirm that he can bring her, if not patient will call back to reschedule

## 2016-02-22 NOTE — Telephone Encounter (Signed)
Really should have an appointment again to see this unless she is able to tell what's causing the rash and eye swelling.

## 2016-02-22 NOTE — Patient Instructions (Addendum)
Use hydrocortisone lotion 1% on both arms until itching improves--may be poison ivy  Use ice pack on right eye for 20 mins 3 times a day until swelling resolves.  Use triamcinolone/nystatin cream on legs for redness (vein problems).

## 2016-02-23 ENCOUNTER — Telehealth: Payer: Self-pay | Admitting: *Deleted

## 2016-02-23 ENCOUNTER — Ambulatory Visit
Admission: RE | Admit: 2016-02-23 | Discharge: 2016-02-23 | Disposition: A | Discharge status: Home / Self Care | Payer: Medicare Other | Source: Ambulatory Visit | Attending: Internal Medicine | Admitting: Internal Medicine

## 2016-02-23 DIAGNOSIS — D509 Iron deficiency anemia, unspecified: Secondary | ICD-10-CM

## 2016-02-23 DIAGNOSIS — F0280 Dementia in other diseases classified elsewhere without behavioral disturbance: Secondary | ICD-10-CM | POA: Diagnosis not present

## 2016-02-23 DIAGNOSIS — R1903 Right lower quadrant abdominal swelling, mass and lump: Secondary | ICD-10-CM

## 2016-02-23 DIAGNOSIS — G20 Parkinson's disease: Secondary | ICD-10-CM | POA: Diagnosis not present

## 2016-02-23 DIAGNOSIS — L03115 Cellulitis of right lower limb: Secondary | ICD-10-CM | POA: Diagnosis not present

## 2016-02-23 DIAGNOSIS — R1031 Right lower quadrant pain: Secondary | ICD-10-CM | POA: Diagnosis not present

## 2016-02-23 DIAGNOSIS — M545 Low back pain: Secondary | ICD-10-CM | POA: Diagnosis not present

## 2016-02-23 DIAGNOSIS — K921 Melena: Secondary | ICD-10-CM

## 2016-02-23 DIAGNOSIS — M6281 Muscle weakness (generalized): Secondary | ICD-10-CM | POA: Diagnosis not present

## 2016-02-23 DIAGNOSIS — R441 Visual hallucinations: Secondary | ICD-10-CM | POA: Diagnosis not present

## 2016-02-23 MED ORDER — IOPAMIDOL (ISOVUE-300) INJECTION 61%
100.0000 mL | Freq: Once | INTRAVENOUS | Status: AC | PRN
  Administered 2016-02-23: 100 mL via INTRAVENOUS

## 2016-02-23 NOTE — Telephone Encounter (Signed)
Patient called and stated that the cream you prescribed her is too expensive $157.00 and patient is requesting something else. Cannot afford this. Please Advise.

## 2016-02-24 NOTE — Telephone Encounter (Signed)
Is it the leg cream or the arm cream?

## 2016-02-26 DIAGNOSIS — L03115 Cellulitis of right lower limb: Secondary | ICD-10-CM | POA: Diagnosis not present

## 2016-02-26 DIAGNOSIS — G20 Parkinson's disease: Secondary | ICD-10-CM | POA: Diagnosis not present

## 2016-02-26 DIAGNOSIS — M545 Low back pain: Secondary | ICD-10-CM | POA: Diagnosis not present

## 2016-02-26 DIAGNOSIS — F0280 Dementia in other diseases classified elsewhere without behavioral disturbance: Secondary | ICD-10-CM | POA: Diagnosis not present

## 2016-02-26 DIAGNOSIS — M6281 Muscle weakness (generalized): Secondary | ICD-10-CM | POA: Diagnosis not present

## 2016-02-26 DIAGNOSIS — R441 Visual hallucinations: Secondary | ICD-10-CM | POA: Diagnosis not present

## 2016-02-26 NOTE — Telephone Encounter (Signed)
Call the pharmacy and find out what they suggest as an alternative due to the cost.

## 2016-02-26 NOTE — Telephone Encounter (Signed)
For the Leg Cream.

## 2016-02-27 DIAGNOSIS — M6281 Muscle weakness (generalized): Secondary | ICD-10-CM | POA: Diagnosis not present

## 2016-02-27 DIAGNOSIS — M545 Low back pain: Secondary | ICD-10-CM | POA: Diagnosis not present

## 2016-02-27 DIAGNOSIS — R441 Visual hallucinations: Secondary | ICD-10-CM | POA: Diagnosis not present

## 2016-02-27 DIAGNOSIS — L03115 Cellulitis of right lower limb: Secondary | ICD-10-CM | POA: Diagnosis not present

## 2016-02-27 DIAGNOSIS — F0280 Dementia in other diseases classified elsewhere without behavioral disturbance: Secondary | ICD-10-CM | POA: Diagnosis not present

## 2016-02-27 DIAGNOSIS — G20 Parkinson's disease: Secondary | ICD-10-CM | POA: Diagnosis not present

## 2016-02-28 DIAGNOSIS — R441 Visual hallucinations: Secondary | ICD-10-CM | POA: Diagnosis not present

## 2016-02-28 DIAGNOSIS — M545 Low back pain: Secondary | ICD-10-CM | POA: Diagnosis not present

## 2016-02-28 DIAGNOSIS — F0280 Dementia in other diseases classified elsewhere without behavioral disturbance: Secondary | ICD-10-CM | POA: Diagnosis not present

## 2016-02-28 DIAGNOSIS — G20 Parkinson's disease: Secondary | ICD-10-CM | POA: Diagnosis not present

## 2016-02-28 DIAGNOSIS — M6281 Muscle weakness (generalized): Secondary | ICD-10-CM | POA: Diagnosis not present

## 2016-02-28 DIAGNOSIS — L03115 Cellulitis of right lower limb: Secondary | ICD-10-CM | POA: Diagnosis not present

## 2016-02-28 MED ORDER — NYSTATIN 100000 UNIT/GM EX CREA: 1.0000 "application " | TOPICAL_CREAM | Freq: Two times a day (BID) | CUTANEOUS | Status: DC

## 2016-02-28 MED ORDER — TRIAMCINOLONE ACETONIDE 0.1 % EX CREA: 1.0000 "application " | TOPICAL_CREAM | Freq: Two times a day (BID) | CUTANEOUS | Status: DC

## 2016-02-28 NOTE — Telephone Encounter (Signed)
Spoke with patient and advised results rx sent to pharmacy by e-script  

## 2016-02-28 NOTE — Telephone Encounter (Signed)
Called [facility] and spoke with Pharmacist. She stated to split the cream up. Please Advise.

## 2016-02-29 DIAGNOSIS — M6281 Muscle weakness (generalized): Secondary | ICD-10-CM | POA: Diagnosis not present

## 2016-02-29 DIAGNOSIS — G20 Parkinson's disease: Secondary | ICD-10-CM | POA: Diagnosis not present

## 2016-02-29 DIAGNOSIS — M545 Low back pain: Secondary | ICD-10-CM | POA: Diagnosis not present

## 2016-02-29 DIAGNOSIS — L03115 Cellulitis of right lower limb: Secondary | ICD-10-CM | POA: Diagnosis not present

## 2016-02-29 DIAGNOSIS — F0280 Dementia in other diseases classified elsewhere without behavioral disturbance: Secondary | ICD-10-CM | POA: Diagnosis not present

## 2016-02-29 DIAGNOSIS — R441 Visual hallucinations: Secondary | ICD-10-CM | POA: Diagnosis not present

## 2016-03-01 ENCOUNTER — Other Ambulatory Visit: Payer: Self-pay | Admitting: *Deleted

## 2016-03-01 ENCOUNTER — Other Ambulatory Visit: Payer: Self-pay | Admitting: Licensed Clinical Social Worker

## 2016-03-01 NOTE — Patient Outreach (Signed)
[facility]) Care Management [place] Telephone Outreach 03/01/2016  [name] [birth year] [id number]  Successful telephone outreach to [name], 75 y/o female followed by [facility] on referral from [place] after ED visit 01/19/16 for safety concerns; prior to that, patient had been followed previously by [facility] telephonic CM. HIPAA verified.  Today, [name] states that she "is doing pretty good."  [name] states that she had the CT scan to follow up on her recent GI bleeding, and reports that she has has not heard back with results yet.  Reassured patient that once the results are available, someone from her provider office will contact her.  [name] again states today that she is taking her medicines as they are prescribed, and manages her own medications independently.  Patient stated that today she had no shortness of breath, weight gain, or ankle/foot swelling.  I discussed with [name] today my general schedule for the next few weeks, and we confirmed [place] home visit scheduled for later this month.  I made sure that [name] had the 24-hour [facility] nurse line phone number and main [facility] CM phone number should she need assistance before our scheduled in home visit, and encouraged her stay in touch with her medical providers should she have concerns or problems that arise.   [name] denies further questions, concerns, issues, or problems today.  Plan: [name] will begin weighing herself every day and will write down her daily weights. [name] will write down any episodes of shortness of breath that she experiences. [name] will continue taking her medications as they are prescribed and will keep her scheduled provider appointments. [name] will notify her providers for any concerns, issues, problems, or questions that arise. [place] home visit scheduled for later this month.   [name] Rack, RN, BSN, [employer] [facility] Care Management  [phone]

## 2016-03-01 NOTE — Patient Outreach (Signed)
[facility]) Care Management  03/01/2016  [name] 10/13/1940 [id number]   Assessment- CSW completed outreach to patient and she answered successfully. CSW wanted to inform her that this CSW will be out of the office all of next week. Patient expressed understanding. Patient questioned if she had any recent falls. She stated she did on Sunday but when CSW asked if this was the fall that she discussed with her during a home visit on 02/21/16 and she confirmed that it was. Patient reports that she almost had a fall during physical therapy but the therapist "caught me." Patient states that the program she was referred to that is making improvements to her house is still working on her home.   Plan-CSW will make next outreach within three weeks.  [name], BSW, MSW, [email or web address] Phone: [phone] Fax: [phone]

## 2016-03-04 ENCOUNTER — Other Ambulatory Visit: Payer: Self-pay

## 2016-03-04 DIAGNOSIS — R441 Visual hallucinations: Secondary | ICD-10-CM | POA: Diagnosis not present

## 2016-03-04 DIAGNOSIS — L03115 Cellulitis of right lower limb: Secondary | ICD-10-CM | POA: Diagnosis not present

## 2016-03-04 DIAGNOSIS — M545 Low back pain: Secondary | ICD-10-CM | POA: Diagnosis not present

## 2016-03-04 DIAGNOSIS — M6281 Muscle weakness (generalized): Secondary | ICD-10-CM | POA: Diagnosis not present

## 2016-03-04 DIAGNOSIS — G20 Parkinson's disease: Secondary | ICD-10-CM | POA: Diagnosis not present

## 2016-03-04 DIAGNOSIS — F0280 Dementia in other diseases classified elsewhere without behavioral disturbance: Secondary | ICD-10-CM | POA: Diagnosis not present

## 2016-03-05 DIAGNOSIS — F0280 Dementia in other diseases classified elsewhere without behavioral disturbance: Secondary | ICD-10-CM | POA: Diagnosis not present

## 2016-03-05 DIAGNOSIS — M6281 Muscle weakness (generalized): Secondary | ICD-10-CM | POA: Diagnosis not present

## 2016-03-05 DIAGNOSIS — G20 Parkinson's disease: Secondary | ICD-10-CM | POA: Diagnosis not present

## 2016-03-05 DIAGNOSIS — R441 Visual hallucinations: Secondary | ICD-10-CM | POA: Diagnosis not present

## 2016-03-05 DIAGNOSIS — M545 Low back pain: Secondary | ICD-10-CM | POA: Diagnosis not present

## 2016-03-05 DIAGNOSIS — L03115 Cellulitis of right lower limb: Secondary | ICD-10-CM | POA: Diagnosis not present

## 2016-03-06 ENCOUNTER — Telehealth: Payer: Self-pay | Admitting: *Deleted

## 2016-03-06 DIAGNOSIS — M6281 Muscle weakness (generalized): Secondary | ICD-10-CM | POA: Diagnosis not present

## 2016-03-06 DIAGNOSIS — M7989 Other specified soft tissue disorders: Secondary | ICD-10-CM

## 2016-03-06 DIAGNOSIS — L03115 Cellulitis of right lower limb: Secondary | ICD-10-CM | POA: Diagnosis not present

## 2016-03-06 DIAGNOSIS — F0280 Dementia in other diseases classified elsewhere without behavioral disturbance: Secondary | ICD-10-CM | POA: Diagnosis not present

## 2016-03-06 DIAGNOSIS — L03116 Cellulitis of left lower limb: Secondary | ICD-10-CM

## 2016-03-06 DIAGNOSIS — M545 Low back pain: Secondary | ICD-10-CM | POA: Diagnosis not present

## 2016-03-06 DIAGNOSIS — R441 Visual hallucinations: Secondary | ICD-10-CM | POA: Diagnosis not present

## 2016-03-06 DIAGNOSIS — G20 Parkinson's disease: Secondary | ICD-10-CM | POA: Diagnosis not present

## 2016-03-06 NOTE — Patient Outreach (Signed)
Successful contact made with patient for community care coordination, assessment of her management of her chronic illnesses. Patient identified herself by providing date of birth and address.  Patient and RNCM address care plan goals and care plan updated. Will update her assigned care coordinator upon her return from vacation.

## 2016-03-06 NOTE — Telephone Encounter (Signed)
Sounds like she needs a venous doppler to rule out DVT based on that description.

## 2016-03-06 NOTE — Telephone Encounter (Signed)
Renee with [facility] called and stated that she saw patient today and patient had a Near Fall but was able to catch herself. No injury. Patient does have some significant swelling and redness to the LLE. It is double the size of the right leg. Nurse stated she wanted to make you aware of this.

## 2016-03-07 DIAGNOSIS — G20 Parkinson's disease: Secondary | ICD-10-CM | POA: Diagnosis not present

## 2016-03-07 DIAGNOSIS — M6281 Muscle weakness (generalized): Secondary | ICD-10-CM | POA: Diagnosis not present

## 2016-03-07 DIAGNOSIS — F0280 Dementia in other diseases classified elsewhere without behavioral disturbance: Secondary | ICD-10-CM | POA: Diagnosis not present

## 2016-03-07 DIAGNOSIS — M545 Low back pain: Secondary | ICD-10-CM | POA: Diagnosis not present

## 2016-03-07 DIAGNOSIS — L03115 Cellulitis of right lower limb: Secondary | ICD-10-CM | POA: Diagnosis not present

## 2016-03-07 DIAGNOSIS — R441 Visual hallucinations: Secondary | ICD-10-CM | POA: Diagnosis not present

## 2016-03-07 NOTE — Telephone Encounter (Signed)
Patient notified and agreed.  

## 2016-03-07 NOTE — Telephone Encounter (Signed)
Renee with [facility] notified and agreed. I tried calling patient but had to [facility] to return call.  Order placed for Venous Doppler

## 2016-03-08 ENCOUNTER — Ambulatory Visit (HOSPITAL_COMMUNITY)
Admission: RE | Admit: 2016-03-08 | Discharge: 2016-03-08 | Disposition: A | Discharge status: Home / Self Care | Payer: Medicare Other | Source: Ambulatory Visit | Attending: Internal Medicine | Admitting: Internal Medicine

## 2016-03-08 DIAGNOSIS — G20 Parkinson's disease: Secondary | ICD-10-CM | POA: Diagnosis not present

## 2016-03-08 DIAGNOSIS — M7989 Other specified soft tissue disorders: Secondary | ICD-10-CM

## 2016-03-08 DIAGNOSIS — K219 Gastro-esophageal reflux disease without esophagitis: Secondary | ICD-10-CM | POA: Diagnosis not present

## 2016-03-08 DIAGNOSIS — E7800 Pure hypercholesterolemia, unspecified: Secondary | ICD-10-CM | POA: Insufficient documentation

## 2016-03-08 DIAGNOSIS — G4730 Sleep apnea, unspecified: Secondary | ICD-10-CM | POA: Diagnosis not present

## 2016-03-08 DIAGNOSIS — F419 Anxiety disorder, unspecified: Secondary | ICD-10-CM | POA: Insufficient documentation

## 2016-03-08 DIAGNOSIS — I5022 Chronic systolic (congestive) heart failure: Secondary | ICD-10-CM | POA: Diagnosis not present

## 2016-03-08 DIAGNOSIS — I10 Essential (primary) hypertension: Secondary | ICD-10-CM | POA: Diagnosis not present

## 2016-03-08 NOTE — Telephone Encounter (Signed)
Regina with vascular lab at [facility] called and stated that the doppler is completely NEGATIVE. Please advise of antibiotic.

## 2016-03-08 NOTE — Telephone Encounter (Signed)
Patient called and stated that the redness in her leg is spreading and just wanted to let you know. Confirmed with patient that she is going to the Venous Doppler today and she agreed.

## 2016-03-08 NOTE — Progress Notes (Signed)
VASCULAR LAB PRELIMINARY  PRELIMINARY  PRELIMINARY  PRELIMINARY  Left lower extremity venous duplex completed.    Preliminary report:  Left:  No evidence of DVT, superficial thrombosis, or Baker's cyst. Moderate interstitial fluid noted throughout the left lower leg  Zaeda Mcferran, RVS 03/08/2016, 4:06 PM

## 2016-03-08 NOTE — Telephone Encounter (Signed)
Noted.  Thanks.  If doppler is negative, will p rescribe her antibiotics again.  Desperately needs personal care help to prevent infections from inadequate ability to perform her hygiene.  She is on wait lists for free help and can't afford to pay for a caregiver.

## 2016-03-09 MED ORDER — SULFAMETHOXAZOLE-TRIMETHOPRIM 800-160 MG PO TABS: 1.0000 | ORAL_TABLET | Freq: Two times a day (BID) | ORAL | Status: DC

## 2016-03-09 NOTE — Addendum Note (Signed)
Addended by: Gayland Curry on: 03/09/2016 12:28 PM   Modules accepted: Orders

## 2016-03-11 ENCOUNTER — Inpatient Hospital Stay (HOSPITAL_COMMUNITY)
Admission: EM | Admit: 2016-03-11 | Discharge: 2016-03-15 | DRG: 603 | Disposition: A | Discharge status: Home Health | Payer: Medicare Other | Attending: Family Medicine | Admitting: Family Medicine

## 2016-03-11 ENCOUNTER — Encounter (HOSPITAL_COMMUNITY): Payer: Self-pay | Admitting: Emergency Medicine

## 2016-03-11 DIAGNOSIS — R062 Wheezing: Secondary | ICD-10-CM

## 2016-03-11 DIAGNOSIS — R5381 Other malaise: Secondary | ICD-10-CM | POA: Diagnosis not present

## 2016-03-11 DIAGNOSIS — L03116 Cellulitis of left lower limb: Principal | ICD-10-CM | POA: Diagnosis present

## 2016-03-11 DIAGNOSIS — G20 Parkinson's disease: Secondary | ICD-10-CM | POA: Diagnosis present

## 2016-03-11 DIAGNOSIS — H409 Unspecified glaucoma: Secondary | ICD-10-CM | POA: Diagnosis present

## 2016-03-11 DIAGNOSIS — J42 Unspecified chronic bronchitis: Secondary | ICD-10-CM | POA: Diagnosis present

## 2016-03-11 DIAGNOSIS — G4730 Sleep apnea, unspecified: Secondary | ICD-10-CM | POA: Diagnosis present

## 2016-03-11 DIAGNOSIS — L03115 Cellulitis of right lower limb: Secondary | ICD-10-CM | POA: Diagnosis present

## 2016-03-11 DIAGNOSIS — I10 Essential (primary) hypertension: Secondary | ICD-10-CM | POA: Diagnosis present

## 2016-03-11 DIAGNOSIS — D509 Iron deficiency anemia, unspecified: Secondary | ICD-10-CM | POA: Diagnosis present

## 2016-03-11 DIAGNOSIS — I110 Hypertensive heart disease with heart failure: Secondary | ICD-10-CM | POA: Diagnosis present

## 2016-03-11 DIAGNOSIS — K219 Gastro-esophageal reflux disease without esophagitis: Secondary | ICD-10-CM | POA: Diagnosis not present

## 2016-03-11 DIAGNOSIS — M79605 Pain in left leg: Secondary | ICD-10-CM | POA: Diagnosis not present

## 2016-03-11 DIAGNOSIS — Z66 Do not resuscitate: Secondary | ICD-10-CM | POA: Diagnosis present

## 2016-03-11 DIAGNOSIS — I5042 Chronic combined systolic (congestive) and diastolic (congestive) heart failure: Secondary | ICD-10-CM | POA: Diagnosis not present

## 2016-03-11 DIAGNOSIS — E039 Hypothyroidism, unspecified: Secondary | ICD-10-CM | POA: Diagnosis present

## 2016-03-11 DIAGNOSIS — I872 Venous insufficiency (chronic) (peripheral): Secondary | ICD-10-CM | POA: Diagnosis present

## 2016-03-11 DIAGNOSIS — I5189 Other ill-defined heart diseases: Secondary | ICD-10-CM | POA: Diagnosis present

## 2016-03-11 DIAGNOSIS — H269 Unspecified cataract: Secondary | ICD-10-CM | POA: Diagnosis present

## 2016-03-11 DIAGNOSIS — E7800 Pure hypercholesterolemia, unspecified: Secondary | ICD-10-CM | POA: Diagnosis present

## 2016-03-11 LAB — BASIC METABOLIC PANEL
ANION GAP: 8 (ref 5–15)
BUN: 16 mg/dL (ref 6–20)
CHLORIDE: 110 mmol/L (ref 101–111)
CO2: 22 mmol/L (ref 22–32)
Calcium: 8.8 mg/dL — ABNORMAL LOW (ref 8.9–10.3)
Creatinine, Ser: 0.82 mg/dL (ref 0.44–1.00)
GFR calc non Af Amer: >60 mL/min (ref >60)
Glucose, Bld: 115 mg/dL — ABNORMAL HIGH (ref 65–99)
Potassium: 4.1 mmol/L (ref 3.5–5.1)
Sodium: 140 mmol/L (ref 135–145)

## 2016-03-11 LAB — CBC WITH DIFFERENTIAL/PLATELET
Basophils Absolute: 0.1 10*3/uL (ref 0.0–0.1)
Basophils Relative: 1 %
Eosinophils Absolute: 0.3 10*3/uL (ref 0.0–0.7)
Eosinophils Relative: 4 %
HEMATOCRIT: 33.8 % — AB (ref 36.0–46.0)
HEMOGLOBIN: 10.1 g/dL — AB (ref 12.0–15.0)
LYMPHS ABS: 1.4 10*3/uL (ref 0.7–4.0)
Lymphocytes Relative: 18 %
MCH: 24.5 pg — AB (ref 26.0–34.0)
MCHC: 29.9 g/dL — AB (ref 30.0–36.0)
MCV: 81.8 fL (ref 78.0–100.0)
Monocytes Absolute: 0.5 10*3/uL (ref 0.1–1.0)
Monocytes Relative: 7 %
NEUTROS ABS: 5.3 10*3/uL (ref 1.7–7.7)
NEUTROS PCT: 71 %
Platelets: 184 10*3/uL (ref 150–400)
RBC: 4.13 MIL/uL (ref 3.87–5.11)
RDW: 18.1 % — ABNORMAL HIGH (ref 11.5–15.5)
WBC: 7.5 10*3/uL (ref 4.0–10.5)

## 2016-03-11 LAB — I-STAT CG4 LACTIC ACID, ED: Lactic Acid, Venous: 0.78 mmol/L (ref 0.5–2.0)

## 2016-03-11 MED ORDER — FUROSEMIDE 40 MG PO TABS
40.0000 mg | ORAL_TABLET | Freq: Two times a day (BID) | ORAL | Status: DC
  Administered 2016-03-11 – 2016-03-12 (×2): 40 mg via ORAL
  Filled 2016-03-11 (×2): qty 1

## 2016-03-11 MED ORDER — CLINDAMYCIN PHOSPHATE 600 MG/50ML IV SOLN
600.0000 mg | Freq: Three times a day (TID) | INTRAVENOUS | Status: DC
Start: 2016-03-11 — End: 2016-03-15
  Administered 2016-03-11 – 2016-03-15 (×11): 600 mg via INTRAVENOUS
  Filled 2016-03-11 (×11): qty 50

## 2016-03-11 MED ORDER — CARBIDOPA-LEVODOPA 25-100 MG PO TABS
1.0000 | ORAL_TABLET | Freq: Four times a day (QID) | ORAL | Status: DC
  Administered 2016-03-11 (×2): 1 via ORAL
  Filled 2016-03-11 (×3): qty 1

## 2016-03-11 MED ORDER — ADULT MULTIVITAMIN W/MINERALS CH
1.0000 | ORAL_TABLET | Freq: Every day | ORAL | Status: DC
  Administered 2016-03-11 – 2016-03-15 (×5): 1 via ORAL
  Filled 2016-03-11 (×6): qty 1

## 2016-03-11 MED ORDER — TRIAMCINOLONE ACETONIDE 0.1 % EX CREA
1.0000 "application " | TOPICAL_CREAM | Freq: Two times a day (BID) | CUTANEOUS | Status: DC
  Administered 2016-03-12 – 2016-03-15 (×7): 1 via TOPICAL
  Filled 2016-03-11 (×2): qty 15

## 2016-03-11 MED ORDER — ENOXAPARIN SODIUM 40 MG/0.4ML ~~LOC~~ SOLN
40.0000 mg | SUBCUTANEOUS | Status: DC
  Administered 2016-03-11 – 2016-03-14 (×4): 40 mg via SUBCUTANEOUS
  Filled 2016-03-11 (×4): qty 0.4

## 2016-03-11 MED ORDER — MELATONIN 3 MG PO TABS: 1.0000 | ORAL_TABLET | Freq: Every evening | ORAL | Status: DC | PRN

## 2016-03-11 MED ORDER — POLYVINYL ALCOHOL 1.4 % OP SOLN
Freq: Every day | OPHTHALMIC | Status: DC
  Administered 2016-03-12 – 2016-03-14 (×3): 1 [drp] via OPHTHALMIC
  Filled 2016-03-11: qty 15

## 2016-03-11 MED ORDER — LORAZEPAM 0.5 MG PO TABS: 0.5000 mg | ORAL_TABLET | Freq: Two times a day (BID) | ORAL | Status: DC | PRN

## 2016-03-11 MED ORDER — ACETAMINOPHEN 325 MG PO TABS: 650.0000 mg | ORAL_TABLET | Freq: Four times a day (QID) | ORAL | Status: DC | PRN

## 2016-03-11 MED ORDER — PRAMIPEXOLE DIHYDROCHLORIDE 1 MG PO TABS
1.0000 mg | ORAL_TABLET | Freq: Three times a day (TID) | ORAL | Status: DC
  Administered 2016-03-11 – 2016-03-15 (×11): 1 mg via ORAL
  Filled 2016-03-11 (×13): qty 1

## 2016-03-11 MED ORDER — HYDROCODONE-ACETAMINOPHEN 5-325 MG PO TABS: 1.0000 | ORAL_TABLET | ORAL | Status: DC | PRN

## 2016-03-11 MED ORDER — OXYCODONE HCL 5 MG PO TABS: 5.0000 mg | ORAL_TABLET | Freq: Four times a day (QID) | ORAL | Status: DC | PRN

## 2016-03-11 MED ORDER — DOCUSATE SODIUM 100 MG PO CAPS
100.0000 mg | ORAL_CAPSULE | Freq: Every day | ORAL | Status: DC
  Administered 2016-03-12 – 2016-03-14 (×3): 100 mg via ORAL
  Filled 2016-03-11 (×3): qty 1

## 2016-03-11 MED ORDER — SULFAMETHOXAZOLE-TRIMETHOPRIM 800-160 MG PO TABS: 1.0000 | ORAL_TABLET | Freq: Once | ORAL | Status: DC

## 2016-03-11 MED ORDER — LATANOPROST 0.005 % OP SOLN
1.0000 [drp] | Freq: Every day | OPHTHALMIC | Status: DC
  Administered 2016-03-12 – 2016-03-14 (×3): 1 [drp] via OPHTHALMIC
  Filled 2016-03-11: qty 2.5

## 2016-03-11 MED ORDER — FERROUS SULFATE 325 (65 FE) MG PO TABS
325.0000 mg | ORAL_TABLET | Freq: Every day | ORAL | Status: DC
  Administered 2016-03-12 – 2016-03-15 (×4): 325 mg via ORAL
  Filled 2016-03-11 (×4): qty 1

## 2016-03-11 MED ORDER — VANCOMYCIN HCL IN DEXTROSE 750-5 MG/150ML-% IV SOLN
750.0000 mg | Freq: Two times a day (BID) | INTRAVENOUS | Status: DC
  Administered 2016-03-11 – 2016-03-12 (×2): 750 mg via INTRAVENOUS
  Filled 2016-03-11 (×3): qty 150

## 2016-03-11 MED ORDER — ACETAMINOPHEN 650 MG RE SUPP: 650.0000 mg | Freq: Four times a day (QID) | RECTAL | Status: DC | PRN

## 2016-03-11 MED ORDER — IBUPROFEN 200 MG PO TABS
400.0000 mg | ORAL_TABLET | Freq: Once | ORAL | Status: AC
  Administered 2016-03-11: 400 mg via ORAL
  Filled 2016-03-11: qty 2

## 2016-03-11 NOTE — ED Provider Notes (Signed)
CSN: [id number]     Arrival date & time 03/11/16  1034 History   None    Chief Complaint  Patient presents with  . Leg Pain     (Consider location/radiation/quality/duration/timing/severity/associated sxs/prior Treatment) HPI  [name] is a 75 y/o female with a PMHx of Parkinson's disease, HTN, arthritis, anemia, CHF, anxiety, chronic bronchitis presenting with left lower extremity swelling.   The patient notes that she's had significant left leg swelling for the past month. She notes that the pain, swelling, erythema have worsened. She notes that the swelling has now gone into her left knee. She notes worsening size over the last week and worsening pain. She denies fever, chills, nausea, vomiting, chest pain. She has mild SOB that is baseline over the last few years. She continues to have good sensation in her feet per her report. No numbness or tingling noted.   From EMR review, at her last PCP appointment on 02/22/2016 she was noted to have a left swollen leg. At that time it was thought to be secondary to venous stasis dermatitis, and she was instructed to use compression stockings. She was also prescribed a triamcinolone/nystatin cream for her legs.  On 03/06/16, a home health service noted that her lower extremity on the left had doubled in size and was significantly erythematous.  She had a lower venous Doppler on 03/08/16 which showed no evidence of a DVT or superficial thrombosis, but did note moderate interstitial fluid throughout the left lower leg. At that time, it seemed that her PCP had prescribed her Bactrim, but the patient was not aware. The patient has not taken any antibiotic.    Past Medical History  Diagnosis Date  . Parkinson's disease   . Hypertension   . Arthritis   . Anemia   . Cellulitis of lower leg 12/24/2011  . Chronic systolic heart failure ([place])   . Acute on chronic systolic heart failure ([place])   . Cellulitis and abscess of leg, except foot   . Hypotension,  unspecified   . Helicobacter pylori (H. pylori)   . CHF (congestive heart failure) ([place])   . Anxiety   . Cataract left  . Glaucoma left  . GERD (gastroesophageal reflux disease)   . Hypercholesterolemia   . Chronic bronchitis ([place])     "usually get it q yr"  . Sleep apnea     "had test years ago; insurance wouldn't pay for mask so I never had one" (04/27/2015)  . Hypothyroidism   . History of blood transfusion     "don't remember why"  . History of bleeding peptic ulcer   . Stroke [facility]) X 3    "that  was the reason I got Parkinson's"   Past Surgical History  Procedure Laterality Date  . Hemiarthroplasty hip Right 09/24/2007    Archie Endo 01/23/2011  . Thyroidectomy    . Esophagogastroduodenoscopy  09/26/2011    Procedure: ESOPHAGOGASTRODUODENOSCOPY (EGD);  Surgeon: Zenovia Jarred, MD;  [facility] END[facility];  Service: Gastroenterology;  Laterality: N/A;  To be done at bedside.  . Esophagogastroduodenoscopy N/A 09/30/2013    Procedure: ESOPHAGOGASTRODUODENOSCOPY (EGD);  Surgeon: [name], MD;  Location: [place];  Service: Endoscopy;  Laterality: N/A;  . Esophagogastroduodenoscopy N/A 11/25/2013    Procedure: ESOPHAGOGASTRODUODENOSCOPY (EGD);  Surgeon: [name], MD;  Location: [facility] ENDOS[facility];  Service: Endoscopy;  Laterality: N/A;  . Tonsillectomy    . Nissen fundoplication      "had OR for GERD"  . Tendon repair  Gluteus medius Archie Endo 01/23/2011  . Back surgery    . Kyphoplasty  06/2009    T12/notes 07/17/2009   Family History  Problem Relation Age of Onset  . GER disease Mother   . Heart disease Father    Social History  Substance Use Topics  . Smoking status: Never Smoker   . Smokeless tobacco: Never Used  . Alcohol Use: No   OB History    No data available     Review of Systems  Constitutional: Negative for fever, chills, diaphoresis and appetite change.  HENT: Negative.   Eyes: Negative for photophobia and pain.  Respiratory: Positive for shortness of  breath. Negative for cough, chest tightness, wheezing and stridor.   Cardiovascular: Positive for leg swelling. Negative for chest pain and palpitations.  Gastrointestinal: Negative for nausea, vomiting, abdominal pain, diarrhea, constipation and abdominal distention.  Endocrine: Negative.   Genitourinary: Negative.   Musculoskeletal: Positive for gait problem. Negative for myalgias, back pain, joint swelling, arthralgias and neck pain.       Significant L LE swelling  Skin: Positive for color change.  Allergic/Immunologic: Negative.   Neurological: Negative for tremors, weakness, light-headedness, numbness and headaches.  Hematological: Negative.   Psychiatric/Behavioral: Negative.       Allergies  Codeine and Penicillins  Home Medications   Prior to Admission medications   Medication Sig Start Date End Date Taking? Authorizing Provider  carbidopa-levodopa (SINEMET IR) 25-100 MG tablet Take 1 at 6a, 1 1/2 pills at [place], 1 at noon, 1 1/2 pills at 3p, 1 at 6pm, 1 1/2 pills at 9pm 01/15/16  Yes Star Age, MD  docusate sodium (COLACE) 100 MG capsule Take 100 mg by mouth daily. Reported on 02/28/2016   Yes Historical Provider, MD  ferrous sulfate 325 (65 FE) MG EC tablet Take 1 tablet (325 mg total) by mouth daily. 02/22/16  Yes [name] L Reed, DO  furosemide (LASIX) 40 MG tablet Take 40 mg by mouth 2 (two) times daily. Patient to increase to BID x 3 days then resume once daily on 02-16-16   Yes Historical Provider, MD  hydrocortisone 1 % lotion Apply 1 application topically 2 (two) times daily. Apply to arms until rash improves 02/22/16  Yes [name] L Reed, DO  latanoprost (XALATAN) 0.005 % ophthalmic solution Place 1 drop into the left eye daily at 6 PM.  02/23/15  Yes Historical Provider, MD  LORazepam (ATIVAN) 0.5 MG tablet Take 1 tablet (0.5 mg total) by mouth 2 (two) times daily as needed for anxiety. 12/29/15  Yes [name] L Reed, DO  Melatonin 5 MG TABS Take one tablet by mouth one hour before  bedtime for sleep 08/10/15  Yes [name] L Reed, DO  Multiple Vitamins-Minerals (MULTIVITAMIN WITH MINERALS) tablet Take 1 tablet by mouth daily.   Yes Historical Provider, MD  nystatin cream (MYCOSTATIN) Apply 1 application topically 2 (two) times daily. For leg redness 02/28/16  Yes [name] L Reed, DO  oxyCODONE (OXY IR/ROXICODONE) 5 MG immediate release tablet Take 1 tablet (5 mg total) by mouth 2 (two) times daily as needed for severe pain. 12/29/15  Yes [name] L Reed, DO  Polyethyl Glycol-Propyl Glycol (SYSTANE OP) Place 1 drop into the right eye daily at 6 PM.   Yes Historical Provider, MD  pramipexole (MIRAPEX) 1 MG tablet Take 1 tablet (1 mg total) by mouth 3 (three) times daily. 10/10/15  Yes Star Age, MD  triamcinolone cream (KENALOG) 0.1 % Apply 1 application topically 2 (two) times daily.  For leg redness 02/28/16  Yes [name] L Reed, DO  Cholecalciferol (VITAMIN D) 2000 units CAPS Take 1 capsule (2,000 Units total) by mouth daily. 02/22/16   [name] L Reed, DO  sulfamethoxazole-trimethoprim (BACTRIM DS,SEPTRA DS) 800-160 MG tablet Take 1 tablet by mouth 2 (two) times daily. For 7 days 03/09/16   [name] L Reed, DO   BP 112/72 mmHg  Pulse 78  Temp(Src) 97.7 F (36.5 C) (Oral)  Resp 16  Ht 5\' 4"  (1.626 m)  Wt 77.111 kg  BMI 29.17 kg/m2  SpO2 94% Physical Exam  Constitutional: She appears well-developed and well-nourished. No distress.  HENT:  Head: Normocephalic and atraumatic.  Mouth/Throat: No oropharyngeal exudate.  Eyes: Conjunctivae are normal. Right eye exhibits no discharge. Left eye exhibits no discharge. No scleral icterus.  Neck: Normal range of motion. Neck supple. No tracheal deviation present.  Cardiovascular: Normal rate, regular rhythm and normal heart sounds.  Exam reveals no gallop and no friction rub.   No murmur heard. Faint DP pulses bilaterally  Pulmonary/Chest: Effort normal and breath sounds normal. No respiratory distress. She has no wheezes. She has no rales.  She exhibits no tenderness.  Abdominal: Soft. Bowel sounds are normal. She exhibits no distension. There is no tenderness. There is no rebound and no guarding.  Musculoskeletal: She exhibits edema and tenderness.  Lymphadenopathy:    She has no cervical adenopathy.  Neurological: She is alert.  Baseline tremor.  Skin: Skin is warm. She is not diaphoretic.  Venous stasis changes of the RLE. Significant erythema, warmth, pitting edema, and venous stasis changes noted over the left LE. Very tender to palpation. Some erythema tracking up towards the knee on the left.  Psychiatric: She has a normal mood and affect.    ED Course  Procedures (including critical care time) Labs Review Labs Reviewed  BASIC METABOLIC PANEL - Abnormal; Notable for the following:    Glucose, Bld 115 (*)    Calcium 8.8 (*)    All other components within normal limits  CBC WITH DIFFERENTIAL/PLATELET - Abnormal; Notable for the following:    Hemoglobin 10.1 (*)    HCT 33.8 (*)    MCH 24.5 (*)    MCHC 29.9 (*)    RDW 18.1 (*)    All other components within normal limits  I-STAT CG4 LACTIC ACID, ED    Imaging Review No results found. I have personally reviewed and evaluated these images and lab results as part of my medical decision-making.   EKG Interpretation None      MDM   Final diagnoses:  Cellulitis of left lower extremity    [name] is a 75 year old with past medical history of Parkinson's disease who lives alone, presenting with cellulitis of her left lower extremity. It appears that it starting to progress up towards her knee. She has not been on antibiotics as an outpatient. She is hemodynamically stable. She is not tachypneic, tachycardic, or febrile. She does not have a leukocytosis. She does not meet sepsis criteria. Given that the patient has no one at home to care for her and has difficulty caring for herself per outpatient notes, she'll be placed in observation for IV antibiotics.  Vancomycin ordered per pharmacy. Spoke with Dr. Clementeen Graham with [facility] who will accept the patient.     Archie Patten, MD 03/11/16 [place], MD 03/12/16 [phone]

## 2016-03-11 NOTE — H&P (Signed)
TRH H&P   Patient Demographics:    [name], is a 75 y.o. female  MRN: [id number]   DOB - 1941-05-08  Admit Date - 03/11/2016  Outpatient Primary MD for the patient is REED, Jonelle Sidle, DO  Referring MD: ED  Outpatient Specialists: NONE  Patient coming from: Home  Chief Complaint  Patient presents with  . Leg Pain      HPI:    [name]  is a 75 y.o. female, With history of Parkinson's disease (uses walker at home), chronic diastolic CHF, history of stasis dermatitis of bilateral legs, GERD, history of upper GI bleed, hypertension, hypothyroidism and history of stroke, iron deficiency anemia presented with bilateral lower extremity redness and swelling. Patient reports having persistent left leg swelling with erythema for almost a month. As per PCP note she has stasis dermatitis of bilateral legs but was noted to have increased swelling that has now worsened over the past 1 week and is associated with increased pain. She denies any fevers, chills, nausea, vomiting, bowel or urinary symptoms. She denies chest pain, shortness of breath, headache, blurred vision, numbness or tingling in her extremities. She denies any, or any insect bite. When seen by her PCP on 6/1 she was instructed to use compression stockings for her leg swelling (consider due to venous stasis dermatitis) and prescribed nystatin/triamcinolone cream. Home health on 6/14 noted that left lower leg had significantly increased in size and was erythematous. She had a Doppler of bilateral lower extremity which was negative for DVT or superficial thrombosis, showed interstitial fluid throughout her left lower leg.Marland Kitchen Her PCP had called in for a prescription for Bactrim on 6/16 but patient reports that she never filled them. Patient presented to the ED with worsening symptoms.  Course in the ED Vitals were stable. Blood  work showed hemoglobin of 10.1, normal chemistry and lactic acid. Patient given a dose of IV vancomycin and hospitalists admission requested for observation on IV antibiotics.   Review of systems:    In addition to the HPI above, Pain in bilateral legs No Fever-chills, No Headache, No changes with Vision or hearing, No problems swallowing food or Liquids, No Chest pain, Cough or Shortness of Breath, No Abdominal pain, No Nausea or Vommitting, Bowel movements are regular, No Blood in stool or Urine, No dysuria, No new skin rashes or bruises, No new joints pains-aches,  No new weakness, tingling, numbness in any extremity, No recent weight gain or loss, No polyuria, polydypsia or polyphagia, No significant Mental Stressors.  A full 10 point Review of Systems was done, except as stated above, all other Review of Systems were negative.   With Past History of the following :    Past Medical History  Diagnosis Date  . Parkinson's disease   . Hypertension   . Arthritis   . Anemia   . Cellulitis of lower  leg 12/24/2011  . Chronic systolic heart failure ([place])   . Acute on chronic systolic heart failure ([place])   . Cellulitis and abscess of leg, except foot   . Hypotension, unspecified   . Helicobacter pylori (H. pylori)   . CHF (congestive heart failure) ([place])   . Anxiety   . Cataract left  . Glaucoma left  . GERD (gastroesophageal reflux disease)   . Hypercholesterolemia   . Chronic bronchitis ([place])     "usually get it q yr"  . Sleep apnea     "had test years ago; insurance wouldn't pay for mask so I never had one" (04/27/2015)  . Hypothyroidism   . History of blood transfusion     "don't remember why"  . History of bleeding peptic ulcer   . Stroke [facility]) X 3    "that  was the reason I got Parkinson's"      Past Surgical History  Procedure Laterality Date  . Hemiarthroplasty hip Right 09/24/2007    Archie Endo 01/23/2011  . Thyroidectomy    . Esophagogastroduodenoscopy  09/26/2011     Procedure: ESOPHAGOGASTRODUODENOSCOPY (EGD);  Surgeon: Zenovia Jarred, MD;  Location: [facility] ENDOSCOPY;  Service: Gastroenterology;  Laterality: N/A;  To be done at bedside.  . Esophagogastroduodenoscopy N/A 09/30/2013    Procedure: ESOPHAGOGASTRODUODENOSCOPY (EGD);  Surgeon: [name], MD;  Location: [place];  Service: Endoscopy;  Laterality: N/A;  . Esophagogastroduodenoscopy N/A 11/25/2013    Procedure: ESOPHAGOGASTRODUODENOSCOPY (EGD);  Surgeon: [name], MD;  Location: [facility] ENDOS[facility];  Service: Endoscopy;  Laterality: N/A;  . Tonsillectomy    . Nissen fundoplication      "had OR for GERD"  . Tendon repair      Gluteus medius Archie Endo 01/23/2011  . Back surgery    . Kyphoplasty  06/2009    T12/notes 07/17/2009      Social History:     Social History  Substance Use Topics  . Smoking status: Never Smoker   . Smokeless tobacco: Never Used  . Alcohol Use: No     Lives - Alone  Mobility - ambulates with a walker     Family History :     Family History  Problem Relation Age of Onset  . GER disease Mother   . Heart disease Father       Home Medications:   Prior to Admission medications   Medication Sig Start Date End Date Taking? Authorizing Provider  carbidopa-levodopa (SINEMET IR) 25-100 MG tablet Take 1 at 6a, 1 1/2 pills at [place], 1 at noon, 1 1/2 pills at 3p, 1 at 6pm, 1 1/2 pills at 9pm 01/15/16  Yes Star Age, MD  docusate sodium (COLACE) 100 MG capsule Take 100 mg by mouth daily. Reported on 02/28/2016   Yes Historical Provider, MD  ferrous sulfate 325 (65 FE) MG EC tablet Take 1 tablet (325 mg total) by mouth daily. 02/22/16  Yes [name] L Reed, DO  furosemide (LASIX) 40 MG tablet Take 40 mg by mouth 2 (two) times daily. Patient to increase to BID x 3 days then resume once daily on 02-16-16   Yes Historical Provider, MD  hydrocortisone 1 % lotion Apply 1 application topically 2 (two) times daily. Apply to arms until rash improves 02/22/16  Yes [name] L Reed, DO    latanoprost (XALATAN) 0.005 % ophthalmic solution Place 1 drop into the left eye daily at 6 PM.  02/23/15  Yes Historical Provider, MD  LORazepam (ATIVAN) 0.5 MG tablet Take 1 tablet (  0.5 mg total) by mouth 2 (two) times daily as needed for anxiety. 12/29/15  Yes [name] L Reed, DO  Melatonin 5 MG TABS Take one tablet by mouth one hour before bedtime for sleep 08/10/15  Yes [name] L Reed, DO  Multiple Vitamins-Minerals (MULTIVITAMIN WITH MINERALS) tablet Take 1 tablet by mouth daily.   Yes Historical Provider, MD  nystatin cream (MYCOSTATIN) Apply 1 application topically 2 (two) times daily. For leg redness 02/28/16  Yes [name] L Reed, DO  oxyCODONE (OXY IR/ROXICODONE) 5 MG immediate release tablet Take 1 tablet (5 mg total) by mouth 2 (two) times daily as needed for severe pain. 12/29/15  Yes [name] L Reed, DO  Polyethyl Glycol-Propyl Glycol (SYSTANE OP) Place 1 drop into the right eye daily at 6 PM.   Yes Historical Provider, MD  pramipexole (MIRAPEX) 1 MG tablet Take 1 tablet (1 mg total) by mouth 3 (three) times daily. 10/10/15  Yes Star Age, MD  triamcinolone cream (KENALOG) 0.1 % Apply 1 application topically 2 (two) times daily. For leg redness 02/28/16  Yes [name] L Reed, DO  Cholecalciferol (VITAMIN D) 2000 units CAPS Take 1 capsule (2,000 Units total) by mouth daily. 02/22/16   [name] L Reed, DO  sulfamethoxazole-trimethoprim (BACTRIM DS,SEPTRA DS) 800-160 MG tablet Take 1 tablet by mouth 2 (two) times daily. For 7 days 03/09/16   Gayland Curry, DO     Allergies:     Allergies  Allergen Reactions  . Codeine Other (See Comments)    Unknown allergic reaction  . Penicillins Other (See Comments)    Pt passed out in doctor's office after penicillin dose     Physical Exam:   Vitals  Blood pressure 122/65, pulse 72, temperature 97.7 F (36.5 C), temperature source Oral, resp. rate 16, height 5\' 4"  (1.626 m), weight 77.111 kg (170 lb), SpO2 99 %.   Elderly female appears frail, has  slurred speech (chronic) HEENT: No pallor, moist mucosa, supple neck Chest: Clear bilaterally, no added sounds CVS: Normal S1 and S2, no murmurs rub or gallop GI: Soft, nondistended, reducible umbilical hernia, nontender, bowel sounds present Musculoskeletal: Significant erythema over bilateral leg with swelling (L>R) that extends from the ankle up close to the knees, is warm to touch and tender. No skin breakdowns or discharge. CNS: Alert and oriented, has impaired speech with tremors   Data Review:    CBC  Recent Labs Lab 03/11/16 1522  WBC 7.5  HGB 10.1*  HCT 33.8*  PLT 184  MCV 81.8  MCH 24.5*  MCHC 29.9*  RDW 18.1*  LYMPHSABS 1.4  MONOABS 0.5  EOSABS 0.3  BASOSABS 0.1   ------------------------------------------------------------------------------------------------------------------  Chemistries   Recent Labs Lab 03/11/16 1522  NA 140  K 4.1  CL 110  CO2 22  GLUCOSE 115*  BUN 16  CREATININE 0.82  CALCIUM 8.8*   ------------------------------------------------------------------------------------------------------------------ estimated creatinine clearance is 59.6 mL/min (by C-G formula based on Cr of 0.82). ------------------------------------------------------------------------------------------------------------------ No results for input(s): TSH, T4TOTAL, T3FREE, THYROIDAB in the last 72 hours.  Invalid input(s): FREET3  Coagulation profile No results for input(s): INR, PROTIME in the last 168 hours. ------------------------------------------------------------------------------------------------------------------- No results for input(s): DDIMER in the last 72 hours. -------------------------------------------------------------------------------------------------------------------  Cardiac Enzymes No results for input(s): CKMB, TROPONINI, MYOGLOBIN in the last 168 hours.  Invalid input(s):  CK ------------------------------------------------------------------------------------------------------------------ No results found for: BNP   ---------------------------------------------------------------------------------------------------------------  Urinalysis    Component Value Date/Time   COLORURINE YELLOW 04/27/2015 1416   APPEARANCEUR CLOUDY* 04/27/2015 1416  LABSPEC 1.024 04/27/2015 1416   PHURINE 6.0 04/27/2015 1416   GLUCOSEU NEGATIVE 04/27/2015 1416   HGBUR NEGATIVE 04/27/2015 1416   [place] 04/27/2015 1416   KETONESUR 15* 04/27/2015 1416   PROTEINUR NEGATIVE 04/27/2015 1416   UROBILINOGEN 0.2 04/27/2015 1416   NITRITE NEGATIVE 04/27/2015 1416   LEUKOCYTESUR LARGE* 04/27/2015 1416    ----------------------------------------------------------------------------------------------------------------   Imaging Results:    No results found.  My personal review of EKG: Not done   Assessment & Plan:    Principal Problem:   Bilateral cellulitis of lower leg Admit under observation. Received her dose of IV vancomycin in the ED. Cellulitis order set initiated. Will start her on empiric IV clindamycin (patient allergy to penicillin) Recent Doppler of bilateral lower extremities negative for DVT. Tylenol when necessary for fever. Will increase her home dose of oxycodone for pain. Keep legs elevated at all times. PT evaluation.  Active Problems:   Parkinson's disease ([place]) Resume Sinemet.      Diastolic dysfunction, grade 2 Euvolemic. Continue Lasix.    Anemia, iron deficiency Continue iron Supplement.     Venous stasis dermatitis of both lower extremities Apply triamcinolone and nystatin ointment        DVT Prophylaxis: Subcutaneous Lovenox    Family Communication: Discussed plan with patient in detail  Code Status DO NOT RESUSCITATE  Likely DC to  home with services versus skilled nursing depending upon PT  evaluation  Condition fair  Consults called: None   Admission status: Observation  Time spent in minutes : 50   Louellen Molder M.D on 03/11/2016 at 5:46 PM  Between 7am to 7pm - Pager - [phone]. After 7pm go to [email or web address] - password [facility]  [facility] - Office  [phone]

## 2016-03-11 NOTE — Telephone Encounter (Signed)
I sent in her antibiotic on saturday

## 2016-03-11 NOTE — Telephone Encounter (Signed)
Called and left message and informed patient an antibiotic was called to pharmacy. Told her to return call to let us know how she was doing.

## 2016-03-11 NOTE — ED Notes (Signed)
Friend/pt. Stated, My legs are on fire. She previous took an antibiotic and it helped but now its back the same except worse.  They checked her for a blood clot and there was none.  Bilateral leower leg pain with redness and a raised rash. This is onset for 1 month.

## 2016-03-11 NOTE — Progress Notes (Signed)
Pharmacy Antibiotic Note  [name] is a 75 y.o. female admitted on 03/11/2016 with cellulitis.  Pharmacy has been consulted for vancomycin2 dosing.  Plan: Vancomycin 750 IV every 12 hours.  Goal trough 10-15 mcg/mL.  Monitor renal function and any culture results for antibiotic adjustments Vanc trough when at steady state  Height: 5\' 4"  (162.6 cm) Weight: 170 lb (77.111 kg) IBW/kg (Calculated) : 54.7  Temp (24hrs), Avg:97.7 F (36.5 C), Min:97.7 F (36.5 C), Max:97.7 F (36.5 C)   Recent Labs Lab 03/11/16 1522  WBC 7.5  CREATININE 0.82  LATICACIDVEN 0.78    Estimated Creatinine Clearance: 59.6 mL/min (by C-G formula based on Cr of 0.82).    Allergies  Allergen Reactions  . Codeine Other (See Comments)    Unknown allergic reaction  . Penicillins Other (See Comments)    Pt passed out in doctor's office after penicillin dose    Antimicrobials this admission: 6/19 vanc >>   Dose adjustments this admission: NA  Microbiology results: NA  Thank you for allowing pharmacy to be a part of this patient's care.  Melburn Popper, PharmD Clinical Pharmacy Resident Pager: [phone] 03/11/2016 4:09 PM

## 2016-03-11 NOTE — Telephone Encounter (Signed)
Do you want to prescribe Antibiotic for patient? Please Advise.

## 2016-03-11 NOTE — Progress Notes (Signed)
[name] [id number] Admission Data: 03/11/2016 6:17 PM Attending Provider: Louellen Molder, MD  [id number], [name], DO Consults/ Treatment Team:    [name] is a 75 y.o. female patient admitted from ED awake, alert  & orientated  X 3,  Full Code, VSS - Blood pressure 131/70, pulse 76, temperature 97.8 F (36.6 C), temperature source Oral, resp. rate 18, height 5\' 4"  (1.626 m), weight 79.561 kg (175 lb 6.4 oz), SpO2 98 %., no c/o shortness of breath, no c/o chest pain, no distress noted.    IV site WDL:  KVO at this time.   Allergies:   Allergies  Allergen Reactions  . Codeine Other (See Comments)    Unknown allergic reaction  . Penicillins Other (See Comments)    Pt passed out in doctor's office after penicillin dose     Past Medical History  Diagnosis Date  . Parkinson's disease   . Hypertension   . Arthritis   . Anemia   . Cellulitis of lower leg 12/24/2011  . Chronic systolic heart failure ([place])   . Acute on chronic systolic heart failure ([place])   . Cellulitis and abscess of leg, except foot   . Hypotension, unspecified   . Helicobacter pylori (H. pylori)   . CHF (congestive heart failure) ([place])   . Anxiety   . Cataract left  . Glaucoma left  . GERD (gastroesophageal reflux disease)   . Hypercholesterolemia   . Chronic bronchitis ([place])     "usually get it q yr"  . Sleep apnea     "had test years ago; insurance wouldn't pay for mask so I never had one" (04/27/2015)  . Hypothyroidism   . History of blood transfusion     "don't remember why"  . History of bleeding peptic ulcer   . Stroke [facility]) X 3    "that  was the reason I got Parkinson's"     Pt orientation to unit, room and routine. Information packet given to patient/family and safety video watched.  Admission INP armband ID verified with patient/family, and in place. SR up x 2, fall risk assessment complete with Patient and family verbalizing understanding of risks associated with falls. Pt verbalizes an  understanding of how to use the call bell and to call for help before getting out of bed.  Skin, clean-dry- intact without evidence of bruising, or skin tears.   No evidence of skin break down noted on exam. Bilateral Cellulitis noted to legs.      Will cont to monitor and assist as needed.  Dayle Points, RN 03/11/2016 6:17 PM

## 2016-03-12 DIAGNOSIS — G20 Parkinson's disease: Secondary | ICD-10-CM | POA: Diagnosis not present

## 2016-03-12 DIAGNOSIS — R5381 Other malaise: Secondary | ICD-10-CM | POA: Diagnosis not present

## 2016-03-12 DIAGNOSIS — M79605 Pain in left leg: Secondary | ICD-10-CM | POA: Diagnosis not present

## 2016-03-12 DIAGNOSIS — I110 Hypertensive heart disease with heart failure: Secondary | ICD-10-CM | POA: Diagnosis present

## 2016-03-12 DIAGNOSIS — K219 Gastro-esophageal reflux disease without esophagitis: Secondary | ICD-10-CM | POA: Diagnosis present

## 2016-03-12 DIAGNOSIS — H409 Unspecified glaucoma: Secondary | ICD-10-CM | POA: Diagnosis present

## 2016-03-12 DIAGNOSIS — H269 Unspecified cataract: Secondary | ICD-10-CM | POA: Diagnosis present

## 2016-03-12 DIAGNOSIS — I517 Cardiomegaly: Secondary | ICD-10-CM | POA: Diagnosis not present

## 2016-03-12 DIAGNOSIS — Z66 Do not resuscitate: Secondary | ICD-10-CM | POA: Diagnosis present

## 2016-03-12 DIAGNOSIS — J42 Unspecified chronic bronchitis: Secondary | ICD-10-CM | POA: Diagnosis present

## 2016-03-12 DIAGNOSIS — G4730 Sleep apnea, unspecified: Secondary | ICD-10-CM | POA: Diagnosis present

## 2016-03-12 DIAGNOSIS — I10 Essential (primary) hypertension: Secondary | ICD-10-CM | POA: Diagnosis not present

## 2016-03-12 DIAGNOSIS — I872 Venous insufficiency (chronic) (peripheral): Secondary | ICD-10-CM | POA: Diagnosis present

## 2016-03-12 DIAGNOSIS — I5042 Chronic combined systolic (congestive) and diastolic (congestive) heart failure: Secondary | ICD-10-CM | POA: Diagnosis present

## 2016-03-12 DIAGNOSIS — E039 Hypothyroidism, unspecified: Secondary | ICD-10-CM | POA: Diagnosis present

## 2016-03-12 DIAGNOSIS — L03116 Cellulitis of left lower limb: Secondary | ICD-10-CM | POA: Diagnosis not present

## 2016-03-12 DIAGNOSIS — D509 Iron deficiency anemia, unspecified: Secondary | ICD-10-CM | POA: Diagnosis not present

## 2016-03-12 DIAGNOSIS — E7800 Pure hypercholesterolemia, unspecified: Secondary | ICD-10-CM | POA: Diagnosis present

## 2016-03-12 DIAGNOSIS — I519 Heart disease, unspecified: Secondary | ICD-10-CM | POA: Diagnosis not present

## 2016-03-12 DIAGNOSIS — L03115 Cellulitis of right lower limb: Secondary | ICD-10-CM | POA: Diagnosis present

## 2016-03-12 MED ORDER — SACCHAROMYCES BOULARDII 250 MG PO CAPS
250.0000 mg | ORAL_CAPSULE | Freq: Two times a day (BID) | ORAL | Status: DC
  Administered 2016-03-12 – 2016-03-15 (×7): 250 mg via ORAL
  Filled 2016-03-12 (×7): qty 1

## 2016-03-12 MED ORDER — CARBIDOPA-LEVODOPA 25-100 MG PO TABS
1.0000 | ORAL_TABLET | Freq: Two times a day (BID) | ORAL | Status: DC
  Administered 2016-03-12: 1 via ORAL
  Filled 2016-03-12 (×2): qty 1

## 2016-03-12 MED ORDER — CARBIDOPA-LEVODOPA 25-100 MG PO TABS
1.5000 | ORAL_TABLET | Freq: Three times a day (TID) | ORAL | Status: DC
  Administered 2016-03-12 – 2016-03-15 (×10): 1.5 via ORAL
  Filled 2016-03-12 (×7): qty 2
  Filled 2016-03-12: qty 1
  Filled 2016-03-12: qty 2

## 2016-03-12 MED ORDER — FUROSEMIDE 40 MG PO TABS
40.0000 mg | ORAL_TABLET | Freq: Every day | ORAL | Status: DC
  Administered 2016-03-13 – 2016-03-15 (×3): 40 mg via ORAL
  Filled 2016-03-12 (×3): qty 1

## 2016-03-12 NOTE — Progress Notes (Signed)
PROGRESS NOTE                                                                                                                                                                                                             Patient Demographics:    [name], is a 75 y.o. female, DOB - Mar 06, 1941, [id number]  Admit date - 03/11/2016   Admitting Physician Louellen Molder, MD  Outpatient Primary MD for the patient is REED, [name], DO  L[facility] -   Outpatient Specialists: none  Chief Complaint  Patient presents with  . Leg Pain       Brief Narrative   75 year old female with Parkinson's disease, chronic diastolic CHF, venous stasis dermatitis of both legs, iron deficiency anemia presented with bilateral lower extremity cellulitis for past few weeks worsened over one week.   Subjective:   Left swelling and pain much better.   Assessment  & Plan :    Principal Problem:  Bilateral cellulitis of lower leg Admit under observation. Received her dose of IV vancomycin in the ED. Cellulitis order set initiated. On empiric IV clindamycin (patient allergy to penicillin). Add florastor . Recent Doppler of bilateral lower extremities negative for DVT. Cellulitis improving. Keep legs elevated at all times. Tylenol when necessary for fever. increased her home dose of oxycodone for pain. PT recommend skilled nursing facility.  Active Problems:  Parkinson's disease ([place]) Resume Sinemet.     Diastolic dysfunction, grade 2 Euvolemic. Continue Lasix.   Anemia, iron deficiency Continue iron Supplement.    Venous stasis dermatitis of both lower extremities continue triamcinolone and nystatin ointment      Code Status : DO NOT RESUSCITATE  Family Communication  : None at bedside  Disposition Plan  : Skilled nursing facility per PT. Possibly on 6/22  Barriers For Discharge : Improving symptoms  Consults  :   None  Procedures  : None  DVT Prophylaxis  :  Lovenox -  Lab Results  Component Value Date   PLT 184 03/11/2016    Antibiotics  :    Anti-infectives    Start     Dose/Rate Route Frequency Ordered Stop   03/11/16 2000  clindamycin (CLEOCIN) IVPB 600 mg     600 mg 100 mL/hr over 30 Minutes Intravenous Every 8 hours 03/11/16 1810     03/11/16 1615  vancomycin (VANCOCIN) IVPB 750 mg/150 ml premix  Status:  Discontinued     750 mg 150 mL/hr over 60 Minutes Intravenous Every 12 hours 03/11/16 1607 03/12/16 0926   03/11/16 1545  sulfamethoxazole-trimethoprim (BACTRIM DS,SEPTRA DS) 800-160 MG per tablet 1 tablet  Status:  Discontinued     1 tablet Oral  Once 03/11/16 1544 03/11/16 1600        Objective:   Filed Vitals:   03/11/16 1810 03/11/16 1811 03/11/16 2125 03/12/16 0459  BP: 131/70  107/54 140/52  Pulse: 76  71 70  Temp: 97.8 F (36.6 C)  98.2 F (36.8 C) 97.9 F (36.6 C)  TempSrc: Oral  Oral Oral  Resp: 18  18 18   Height:  5\' 4"  (1.626 m)    Weight:  79.561 kg (175 lb 6.4 oz)    SpO2: 98%  96% 98%    Wt Readings from Last 3 Encounters:  03/11/16 79.561 kg (175 lb 6.4 oz)  02/22/16 79.436 kg (175 lb 2 oz)  02/15/16 78.019 kg (172 lb)     Intake/Output Summary (Last 24 hours) at 03/12/16 1329 Last data filed at 03/12/16 1047  Gross per 24 hour  Intake    270 ml  Output   3000 ml  Net  -2730 ml     Physical Exam  Gen: not in distress HEENT:  moist mucosa, supple neck Chest: clear b/l, no added sounds CVS: N S1&S2, no murmurs, rubs or gallop GI: soft, NT, ND, BS+ Musculoskeletal: Erythema and swelling of bilateral lower extremity ( L>R) much improved. Minimal tenderness CNS: alert and  oriented, non focal    Data Review:    CBC  Recent Labs Lab 03/11/16 1522  WBC 7.5  HGB 10.1*  HCT 33.8*  PLT 184  MCV 81.8  MCH 24.5*  MCHC 29.9*  RDW 18.1*  LYMPHSABS 1.4  MONOABS 0.5  EOSABS 0.3  BASOSABS 0.1    Chemistries   Recent Labs Lab  03/11/16 1522  NA 140  K 4.1  CL 110  CO2 22  GLUCOSE 115*  BUN 16  CREATININE 0.82  CALCIUM 8.8*   ------------------------------------------------------------------------------------------------------------------ No results for input(s): CHOL, HDL, LDLCALC, TRIG, CHOLHDL, LDLDIRECT in the last 72 hours.  No results found for: HGBA1C ------------------------------------------------------------------------------------------------------------------ No results for input(s): TSH, T4TOTAL, T3FREE, THYROIDAB in the last 72 hours.  Invalid input(s): FREET3 ------------------------------------------------------------------------------------------------------------------ No results for input(s): VITAMINB12, FOLATE, FERRITIN, TIBC, IRON, RETICCTPCT in the last 72 hours.  Coagulation profile No results for input(s): INR, PROTIME in the last 168 hours.  No results for input(s): DDIMER in the last 72 hours.  Cardiac Enzymes No results for input(s): CKMB, TROPONINI, MYOGLOBIN in the last 168 hours.  Invalid input(s): CK ------------------------------------------------------------------------------------------------------------------ No results found for: BNP  Inpatient Medications  Scheduled Meds: . carbidopa-levodopa  1 tablet Oral BID  . carbidopa-levodopa  1.5 tablet Oral TID  . clindamycin (CLEOCIN) IV  600 mg Intravenous Q8H  . docusate sodium  100 mg Oral Daily  . enoxaparin (LOVENOX) injection  40 mg Subcutaneous Q24H  . ferrous sulfate  325 mg Oral Daily  . [START ON 03/13/2016] furosemide  40 mg Oral Daily  . latanoprost  1 drop Left Eye q1800  . multivitamin with minerals  1 tablet Oral Daily  . polyvinyl alcohol   Right Eye q1800  . pramipexole  1 mg Oral TID  . triamcinolone cream  1 application Topical BID   Continuous Infusions:  PRN Meds:.acetaminophen **OR** acetaminophen, HYDROcodone-acetaminophen, LORazepam, Melatonin,  oxyCODONE  Micro Results No results  found for this or any previous visit (from the past 240 hour(s)).  Radiology Reports Ct Abdomen Pelvis W Contrast  02/23/2016  CLINICAL DATA:  Blood in stool for 3-4 weeks, right lower quadrant and mid abdominal pain EXAM: CT ABDOMEN AND PELVIS WITH CONTRAST TECHNIQUE: Multidetector CT imaging of the abdomen and pelvis was performed using the standard protocol following bolus administration of intravenous contrast. CONTRAST:  133mL ISOVUE-300 IOPAMIDOL (ISOVUE-300) INJECTION 61% COMPARISON:  08/07/2014 FINDINGS: Lower chest: Moderate to large hiatal hernia unchanged. Mild scarring or atelectasis left lung base stable from prior study. Lung bases otherwise clear. Stable mild cardiac enlargement. Hepatobiliary: Numerous hepatic cysts are stable including 1 in the dome measuring about 1.5 cm that is partially calcified. Gallbladder appears relatively contracted. Pancreas: Neck Spleen: Negative Adrenals/Urinary Tract: Adrenal glands are normal. Right kidney is normal. There are few small left renal lesions measuring less than or equal to a cm. The largest is about 1 cm in the lower pole and is of low-attenuation. There is an exophytic 11 mm lesion arising off the anterior lower pole with average attenuation value of 88. These mildly larger than it was previously. It was previously of attenuation value 64. No hydronephrosis. Bladder decompressed and also obscured by beam artifact related to right hip replacement. Stomach/Bowel: Nonobstructive bowel gas pattern. No abnormalities appreciated involving small or large bowel. Appendix is normal. Moderate to large hiatal hernia. Vascular/Lymphatic: Atherosclerotic aortoiliac calcification. No acute findings. Reproductive: Reproductive organs not well characterized, largely obscured by beam attenuation artifact related to right hip replacement. Other: No ascites appreciated Musculoskeletal: Right hip replacement. T12 kyphoplasty. Stable appearance at this level. Moderate T11  compression deformity new from the prior study with a somewhat acute appearance. Severe T8 compression deformity is stable. Fat containing anterior abdominal wall hernia is stable. IMPRESSION: Stable hepatic cysts. Largely stable renal cysts on the left. However, there is an 11 mm indeterminate exophytic lower pole left renal lesion. It shows increased size and attenuation compared to prior study. It may represent a complicated cyst, but a mass is not excluded. Nonemergent renal ultrasound suggested. Moderate to large hiatal hernia. No other significant bowel abnormalities. Moderate T11 compression deformity appears somewhat acute Limited evaluation of pelvic organs and pelvic portion of bowel due to beam attenuation artifact from right hip replacement. Electronically Signed   By: Skipper Cliche M.D.   On: 02/23/2016 15:35    Time Spent in minutes  25   Louellen Molder M.D on 03/12/2016 at 1:29 PM  Between 7am to 7pm - Pager - [phone]  After 7pm go to [email or web address] - password [facility]  [facility] -  Office  [phone]

## 2016-03-12 NOTE — Care Management Obs Status (Signed)
[place] NOTIFICATION   Patient Details  Name: [name] MRN: [id number] Date of Birth: December 16, 1940   Medicare Observation Status Notification Given:  Yes    Carles Collet, RN 03/12/2016, 9:12 AM

## 2016-03-12 NOTE — Care Management Note (Signed)
Case Management Note  Patient Details  Name: MERCIE POERTNER MRN: [id number] Date of Birth: [birth year]  Subjective/Objective:                 Patient from home, lives alone. Admitted with bilateral cellulitis of lower extremities. IV Abx.    Action/Plan:  Will require HH if DC to home assist with PT and ADLs/ disease management.  Expected Discharge Date:                  Expected Discharge Plan:  [place]  In-House Referral:     Discharge planning Services  CM Consult  Post Acute Care Choice:    Choice offered to:     DME Arranged:    DME Agency:     HH Arranged:    [place] Agency:     Status of Service:  In process, will continue to follow  Medicare Important Message Given:    Date Medicare IM Given:    Medicare IM give by:    Date Additional Medicare IM Given:    Additional Medicare Important Message give by:     If discussed at [place] of Stay Meetings, dates discussed:    Additional Comments:  Carles Collet, RN 03/12/2016, 11:39 AM

## 2016-03-12 NOTE — Evaluation (Addendum)
Physical Therapy Evaluation Patient Details Name: [name] MRN: [id number] DOB: [birth year] Today's Date: 03/12/2016   History of Present Illness  Patient is a 75 y/o female with hx of PD, HTN, chronic systolic heart failure, history of stasis dermatitis of bilateral legs and anxiety presents with BLE redness and swelling.  Clinical Impression  Patient presents with generalized weakness, dyspnea on exertion, balance deficits and impaired endurance impacting mobility. Pt reports multiple falls at home and does not have good support. Tolerated gait training with Min A for balance/safety. Pt is a high fall risk. Pt would benefit from Short term [name] to maximize independence and mobility prior to return home. If not able to qualify, need to maximize [facility] services- HHPT/OT aide, RN etc.     Follow Up Recommendations [name];Supervision for mobility/OOB (understand pt is obs)    Equipment Recommendations  None recommended by PT    Recommendations for Other Services OT consult     Precautions / Restrictions Precautions Precautions: Fall Restrictions Weight Bearing Restrictions: No      Mobility  Bed Mobility Overal bed mobility: Needs Assistance             General bed mobility comments: Sitting on BSC upon PT arrival.   Transfers Overall transfer level: Needs assistance Equipment used: Rolling walker (2 wheeled) Transfers: Sit to/from Stand Sit to Stand: Min guard         General transfer comment: Min guard for safety. Slow to stand from [facility] and from EOB. Transferred to chair post ambulation bout.  Ambulation/Gait Ambulation/Gait assistance: Min assist Ambulation Distance (Feet): 60 Feet Assistive device: Rolling walker (2 wheeled) Gait Pattern/deviations: Step-through pattern;Trunk flexed;Step-to pattern Gait velocity: decreased   General Gait Details: Pt with long step lengths bilaterally with RW too far anterior to CoM. Cues for RW proximity. Unsteady.  Stairs             Wheelchair Mobility    Modified Rankin (Stroke Patients Only)       Balance Overall balance assessment: Needs assistance;History of Falls Sitting-balance support: Feet supported;No upper extremity supported Sitting balance-Leahy Scale: Fair     Standing balance support: During functional activity;Bilateral upper extremity supported Standing balance-Leahy Scale: Poor Standing balance comment: Requires UE support during standing.                             Pertinent Vitals/Pain Pain Assessment: No/denies pain    Home Living Family/patient expects to be discharged to:: Private residence Living Arrangements: Alone Available Help at Discharge: Family;Available PRN/intermittently Type of Home: House Home Access: Stairs to enter   Entrance Stairs-Number of Steps: 1 Home Layout: One level Home Equipment: Walker - 4 wheels;Bedside commode;Tub bench;Wheelchair - Press photographer      Prior Function Level of Independence: Independent with assistive device(s)         Comments: Uses rollator. Pt with recent falls.  Reports she used to have an aide but no longer does. has meals on wheels and some support from brother.     Hand Dominance   Dominant Hand: Right    Extremity/Trunk Assessment   Upper Extremity Assessment: Defer to OT evaluation           Lower Extremity Assessment: Generalized weakness      Cervical / Trunk Assessment: Kyphotic  Communication   Communication: Expressive difficulties (dysarthria due to dyskinesias)  Cognition Arousal/Alertness: Awake/alert Behavior During Therapy: WFL for tasks assessed/performed Overall Cognitive Status: No  family/caregiver present to determine baseline cognitive functioning                      General Comments      Exercises        Assessment/Plan    PT Assessment Patient needs continued PT services  PT Diagnosis Difficulty walking;Generalized weakness   PT  Problem List Decreased strength;Cardiopulmonary status limiting activity;Decreased activity tolerance;Decreased balance;Decreased mobility;Decreased safety awareness;Decreased knowledge of use of DME;Decreased skin integrity  PT Treatment Interventions Balance training;Gait training;Stair training;Functional mobility training;Therapeutic activities;Therapeutic exercise;Patient/family education   PT Goals (Current goals can be found in the Care Plan section) Acute Rehab PT Goals Patient Stated Goal: to go home PT Goal Formulation: With patient Time For Goal Achievement: 03/26/16 Potential to Achieve Goals: Fair    Frequency Min 3X/week   Barriers to discharge Decreased caregiver support lives alone    Co-evaluation               End of Session Equipment Utilized During Treatment: Gait belt Activity Tolerance: Patient limited by fatigue Patient left: in chair;with call bell/phone within reach;with chair alarm set;with nursing/sitter in room Nurse Communication: Mobility status    Functional Assessment Tool Used: clinical judgment Functional Limitation: Mobility: Walking and moving around Mobility: Walking and Moving Around Current Status [phone]): At least 20 percent but less than 40 percent impaired, limited or restricted Mobility: Walking and Moving Around Goal Status [phone]): At least 1 percent but less than 20 percent impaired, limited or restricted    Time: 1040-1103 PT Time Calculation (min) (ACUTE ONLY): 23 min   Charges:   PT Evaluation $PT Eval Moderate Complexity: 1 Procedure PT Treatments $Gait Training: 8-22 mins   PT G Codes:   PT G-Codes **NOT FOR INPATIENT CLASS** Functional Assessment Tool Used: clinical judgment Functional Limitation: Mobility: Walking and moving around Mobility: Walking and Moving Around Current Status [id number]): At least 20 percent but less than 40 percent impaired, limited or restricted Mobility: Walking and Moving Around Goal Status  [phone]): At least 1 percent but less than 20 percent impaired, limited or restricted    [place] 03/12/2016, 11:40 AM  Wray Kearns, PT, DPT [phone]

## 2016-03-13 DIAGNOSIS — I519 Heart disease, unspecified: Secondary | ICD-10-CM

## 2016-03-13 DIAGNOSIS — L03115 Cellulitis of right lower limb: Secondary | ICD-10-CM

## 2016-03-13 DIAGNOSIS — D509 Iron deficiency anemia, unspecified: Secondary | ICD-10-CM

## 2016-03-13 DIAGNOSIS — G20 Parkinson's disease: Secondary | ICD-10-CM

## 2016-03-13 DIAGNOSIS — R5381 Other malaise: Secondary | ICD-10-CM

## 2016-03-13 DIAGNOSIS — I8312 Varicose veins of left lower extremity with inflammation: Secondary | ICD-10-CM

## 2016-03-13 DIAGNOSIS — L03116 Cellulitis of left lower limb: Principal | ICD-10-CM

## 2016-03-13 DIAGNOSIS — I8311 Varicose veins of right lower extremity with inflammation: Secondary | ICD-10-CM

## 2016-03-13 DIAGNOSIS — I10 Essential (primary) hypertension: Secondary | ICD-10-CM

## 2016-03-13 MED ORDER — CARBIDOPA-LEVODOPA 25-100 MG PO TABS
1.0000 | ORAL_TABLET | Freq: Three times a day (TID) | ORAL | Status: DC
  Administered 2016-03-13 – 2016-03-15 (×7): 1 via ORAL
  Filled 2016-03-13 (×5): qty 1
  Filled 2016-03-13: qty 2
  Filled 2016-03-13: qty 1

## 2016-03-13 NOTE — Progress Notes (Signed)
CSW spoke with pt concerning PT recommendation for [name]- pt is not wanting [name] placement at this time and states she was in the process of getting home health into the house prior to admission and would like to go home with home health at time of DC  CSW signing off  Domenica Reamer, [place] Worker [phone]

## 2016-03-13 NOTE — Care Management Important Message (Signed)
Important Message  Patient Details  Name: [name] MRN: [id number] Date of Birth: 09/11/1941   Medicare Important Message Given:  Yes    Loann Quill 03/13/2016, 1:53 PM

## 2016-03-13 NOTE — Care Management Note (Addendum)
Case Management Note  Patient Details  Name: [name] MRN: [id number] Date of Birth: June 19, 1941  Subjective/Objective:                  Patient from home, lives alone. Admitted with bilateral cellulitis of lower extremities. IV Abx.     Action/Plan:  Active with [facility] for HH PT OT ST RN HHA SW. CM placed resumption of HH order after patient declined [name] placement from CSW. Referral placed. Patient also followed by [facility]. Her brother will provide transport home.   Expected Discharge Date:                  Expected Discharge Plan:  [place]  In-House Referral:  Clinical Social Work  Discharge planning Services  CM Consult  Post Acute Care Choice:  Home Health, Resumption of Svcs/PTA Provider Choice offered to:  Patient  DME Arranged:    DME Agency:     HH Arranged:    [place]:  [place]  Status of Service:  Completed, signed off  If discussed at [employer], dates discussed:    Additional Comments:  Carles Collet, RN 03/13/2016, 2:18 PM

## 2016-03-13 NOTE — Progress Notes (Signed)
Physical Therapy Treatment Patient Details Name: [name] MRN: [id number] DOB: 1940-12-14 Today's Date: 03/13/2016    History of Present Illness Patient is a 75 y/o female with hx of PD, HTN, chronic systolic heart failure, history of stasis dermatitis of bilateral legs and anxiety presents with BLE redness and swelling.    PT Comments    Patient with increased rigidity and shuffling gait this session. Currently min/mod A for mobility. Continue to recommend [name] for further skilled PT services to maximize independence and safety with mobility before returning home alone.   Follow Up Recommendations  [name];Supervision for mobility/OOB     Equipment Recommendations  None recommended by PT    Recommendations for Other Services OT consult     Precautions / Restrictions Precautions Precautions: Fall Restrictions Weight Bearing Restrictions: No    Mobility  Bed Mobility Overal bed mobility: Needs Assistance Bed Mobility: Rolling;Sidelying to Sit;Sit to Sidelying Rolling: Mod assist Sidelying to sit: HOB elevated;Mod assist     Sit to sidelying: Mod assist General bed mobility comments: cues for technique; use of bed rail; assist to roll to L side with use of bed rail to maintiain sidelying and assist to elevate trunk into sitting and elevate bilat LE back to bed  Transfers Overall transfer level: Needs assistance Equipment used: Rolling walker (2 wheeled) Transfers: Sit to/from Stand Sit to Stand: Mod assist         General transfer comment: assist to power up into standing and stabilize upon standing with posterior lean and pt using bilat LE on EOB to maintain balance  Ambulation/Gait Ambulation/Gait assistance: Min assist Ambulation Distance (Feet): 25 Feet Assistive device: Rolling walker (2 wheeled) Gait Pattern/deviations: Shuffle;Trunk flexed Gait velocity: decreased   General Gait Details: pt with shuffling gait and flexed trunk; multimodal cues for posture,  position of RW, and forward gaze   Stairs            Wheelchair Mobility    Modified Rankin (Stroke Patients Only)       Balance Overall balance assessment: Needs assistance Sitting-balance support: Feet supported;Bilateral upper extremity supported Sitting balance-Leahy Scale: Fair     Standing balance support: Bilateral upper extremity supported Standing balance-Leahy Scale: Poor                      Cognition Arousal/Alertness: Awake/alert Behavior During Therapy: WFL for tasks assessed/performed Overall Cognitive Status: No family/caregiver present to determine baseline cognitive functioning                      Exercises      General Comments General comments (skin integrity, edema, etc.): pt with increased rigidity and shuffle gait pattern this session; pt reported getting parkinson's meds later than usual and feels she isn't doing as well today      Pertinent Vitals/Pain Pain Assessment: No/denies pain    Home Living                      Prior Function            PT Goals (current goals can now be found in the care plan section) Acute Rehab PT Goals Patient Stated Goal: to go home Progress towards PT goals: Not progressing toward goals - comment    Frequency  Min 3X/week    PT Plan Current plan remains appropriate    Co-evaluation             End of  Session Equipment Utilized During Treatment: Gait belt Activity Tolerance: Patient limited by fatigue Patient left: in bed;with call bell/phone within reach;with bed alarm set     Time: [id number] PT Time Calculation (min) (ACUTE ONLY): 27 min  Charges:  $Gait Training: 8-22 mins $Therapeutic Activity: 8-22 mins                    G Codes:      [place] April, PTA Pager: [phone]   03/13/2016, 4:23 PM

## 2016-03-13 NOTE — Progress Notes (Signed)
PROGRESS NOTE                                                                                                                                                                                                             Patient Demographics:    [name], is a 75 y.o. female, DOB - 18-Nov-1940, FY:[id number]  Admit date - 03/11/2016   Admitting Physician Louellen Molder, MD  Outpatient Primary MD for the patient is REED, [place], DO  LOS - 1  Outpatient Specialists: none  Chief Complaint  Patient presents with  . Leg Pain       Brief Narrative   75 year old female with Parkinson's disease, chronic diastolic CHF, venous stasis dermatitis of both legs, iron deficiency anemia presented with bilateral lower extremity cellulitis for past few weeks worsened over one week.   Subjective:   Pt without complaints.     Assessment  & Plan :    Principal Problem:  Bilateral cellulitis of lower leg Slowly improving on IV clindamycin.  Also on florastor . Recent Doppler of bilateral lower extremities negative for DVT. Keep legs elevated at all times. Tylenol when necessary for fever. increased her home dose of oxycodone for pain. PT recommend skilled nursing facility but patient refuses, says home with HHPT only option.  Active Problems:  Parkinson's disease ([place]) Resume Sinemet.   Diastolic dysfunction, grade 2 Euvolemic. Continue Lasix.   Anemia, iron deficiency Continue iron Supplement.   Venous stasis dermatitis of both lower extremities continue triamcinolone and nystatin ointment, elevating legs and diuresing well  Code Status : DO NOT RESUSCITATE  Family Communication  : None at bedside  Disposition Plan  : Home with Home Health PT, (Pt refused [name])  Barriers For Discharge : Improving symptoms  Consults  :  None  Procedures  : None  DVT Prophylaxis  :  Lovenox -  Lab Results  Component Value  Date   PLT 184 03/11/2016    Antibiotics  :    Anti-infectives    Start     Dose/Rate Route Frequency Ordered Stop   03/11/16 2000  clindamycin (CLEOCIN) IVPB 600 mg     600 mg 100 mL/hr over 30 Minutes Intravenous Every 8 hours 03/11/16 1810     03/11/16 1615  vancomycin (VANCOCIN) IVPB 750 mg/150 ml premix  Status:  Discontinued  750 mg 150 mL/hr over 60 Minutes Intravenous Every 12 hours 03/11/16 1607 03/12/16 0926   03/11/16 1545  sulfamethoxazole-trimethoprim (BACTRIM DS,SEPTRA DS) 800-160 MG per tablet 1 tablet  Status:  Discontinued     1 tablet Oral  Once 03/11/16 1544 03/11/16 1600        Objective:   Filed Vitals:   03/12/16 0459 03/12/16 1440 03/13/16 0210 03/13/16 0520  BP: 140/52 121/60 146/70 110/58  Pulse: 70 70 65 72  Temp: 97.9 F (36.6 C) 98 F (36.7 C) 97.8 F (36.6 C) 97.5 F (36.4 C)  TempSrc: Oral Oral Oral Oral  Resp: 18 16 18 18   Height:      Weight:      SpO2: 98% 100% 96% 99%    Wt Readings from Last 3 Encounters:  03/11/16 175 lb 6.4 oz (79.561 kg)  02/22/16 175 lb 2 oz (79.436 kg)  02/15/16 172 lb (78.019 kg)    Intake/Output Summary (Last 24 hours) at 03/13/16 1207 Last data filed at 03/13/16 [id number]  Gross per 24 hour  Intake      0 ml  Output   1020 ml  Net  -1020 ml    Physical Exam  Gen: not in distress HEENT:  moist mucosa, supple neck Chest: clear b/l, no added sounds CVS: N S1&S2, no murmurs, rubs or gallop GI: soft, NT, ND, BS+ Musculoskeletal: Erythema and swelling of bilateral lower extremity ( L>R), still with a lot of heat in LLE and redness.  Tenderness CNS: alert and  oriented, non focal    Data Review:    CBC  Recent Labs Lab 03/11/16 1522  WBC 7.5  HGB 10.1*  HCT 33.8*  PLT 184  MCV 81.8  MCH 24.5*  MCHC 29.9*  RDW 18.1*  LYMPHSABS 1.4  MONOABS 0.5  EOSABS 0.3  BASOSABS 0.1    Chemistries   Recent Labs Lab 03/11/16 1522  NA 140  K 4.1  CL 110  CO2 22  GLUCOSE 115*  BUN 16    CREATININE 0.82  CALCIUM 8.8*   ------------------------------------------------------------------------------------------------------------------ No results for input(s): CHOL, HDL, LDLCALC, TRIG, CHOLHDL, LDLDIRECT in the last 72 hours.  No results found for: HGBA1C ------------------------------------------------------------------------------------------------------------------ No results for input(s): TSH, T4TOTAL, T3FREE, THYROIDAB in the last 72 hours.  Invalid input(s): FREET3 ------------------------------------------------------------------------------------------------------------------ No results for input(s): VITAMINB12, FOLATE, FERRITIN, TIBC, IRON, RETICCTPCT in the last 72 hours.  Coagulation profile No results for input(s): INR, PROTIME in the last 168 hours.  No results for input(s): DDIMER in the last 72 hours.  Cardiac Enzymes No results for input(s): CKMB, TROPONINI, MYOGLOBIN in the last 168 hours.  Invalid input(s): CK ------------------------------------------------------------------------------------------------------------------ No results found for: BNP  Inpatient Medications  Scheduled Meds: . carbidopa-levodopa  1 tablet Oral BID  . carbidopa-levodopa  1.5 tablet Oral TID  . clindamycin (CLEOCIN) IV  600 mg Intravenous Q8H  . docusate sodium  100 mg Oral Daily  . enoxaparin (LOVENOX) injection  40 mg Subcutaneous Q24H  . ferrous sulfate  325 mg Oral Daily  . furosemide  40 mg Oral Daily  . latanoprost  1 drop Left Eye q1800  . multivitamin with minerals  1 tablet Oral Daily  . polyvinyl alcohol   Right Eye q1800  . pramipexole  1 mg Oral TID  . saccharomyces boulardii  250 mg Oral BID  . triamcinolone cream  1 application Topical BID   Continuous Infusions:  PRN Meds:.acetaminophen **OR** acetaminophen, HYDROcodone-acetaminophen, LORazepam, Melatonin, oxyCODONE  Micro Results No  results found for this or any previous visit (from the past  240 hour(s)).  Radiology Reports Ct Abdomen Pelvis W Contrast  02/23/2016  CLINICAL DATA:  Blood in stool for 3-4 weeks, right lower quadrant and mid abdominal pain EXAM: CT ABDOMEN AND PELVIS WITH CONTRAST TECHNIQUE: Multidetector CT imaging of the abdomen and pelvis was performed using the standard protocol following bolus administration of intravenous contrast. CONTRAST:  155mL ISOVUE-300 IOPAMIDOL (ISOVUE-300) INJECTION 61% COMPARISON:  08/07/2014 FINDINGS: Lower chest: Moderate to large hiatal hernia unchanged. Mild scarring or atelectasis left lung base stable from prior study. Lung bases otherwise clear. Stable mild cardiac enlargement. Hepatobiliary: Numerous hepatic cysts are stable including 1 in the dome measuring about 1.5 cm that is partially calcified. Gallbladder appears relatively contracted. Pancreas: Neck Spleen: Negative Adrenals/Urinary Tract: Adrenal glands are normal. Right kidney is normal. There are few small left renal lesions measuring less than or equal to a cm. The largest is about 1 cm in the lower pole and is of low-attenuation. There is an exophytic 11 mm lesion arising off the anterior lower pole with average attenuation value of 88. These mildly larger than it was previously. It was previously of attenuation value 64. No hydronephrosis. Bladder decompressed and also obscured by beam artifact related to right hip replacement. Stomach/Bowel: Nonobstructive bowel gas pattern. No abnormalities appreciated involving small or large bowel. Appendix is normal. Moderate to large hiatal hernia. Vascular/Lymphatic: Atherosclerotic aortoiliac calcification. No acute findings. Reproductive: Reproductive organs not well characterized, largely obscured by beam attenuation artifact related to right hip replacement. Other: No ascites appreciated Musculoskeletal: Right hip replacement. T12 kyphoplasty. Stable appearance at this level. Moderate T11 compression deformity new from the prior study with  a somewhat acute appearance. Severe T8 compression deformity is stable. Fat containing anterior abdominal wall hernia is stable. IMPRESSION: Stable hepatic cysts. Largely stable renal cysts on the left. However, there is an 11 mm indeterminate exophytic lower pole left renal lesion. It shows increased size and attenuation compared to prior study. It may represent a complicated cyst, but a mass is not excluded. Nonemergent renal ultrasound suggested. Moderate to large hiatal hernia. No other significant bowel abnormalities. Moderate T11 compression deformity appears somewhat acute Limited evaluation of pelvic organs and pelvic portion of bowel due to beam attenuation artifact from right hip replacement. Electronically Signed   By: Skipper Cliche M.D.   On: 02/23/2016 15:35    Time Spent in minutes  25   Clanford Johnson M.D on 03/13/2016 at 12:07 PM  Between 7am to 7pm - Pager - [phone]  After 7pm go to [email or web address] - password [facility]  [facility] -  Office  [phone]

## 2016-03-13 NOTE — Consult Note (Signed)
   [facility] Inpatient Consult   03/13/2016  BRENDALIS VALENTINI [birth year] [id number]   Patient is currently active with [place] Management program for long-term disease management services. Patient will receive post hospital discharge transition of care calls and will be assessed for monthly home visits for assessments and for education. Made inpatient RNCM aware patient is active with [facility] Care Management. Please see chart review tab then notes in for further patient outreach details by [facility] team. Martin Majestic to bedside to speak with patient. However, she was working with therapy. Noted Mrs. Younghans is currently declining [name] placement. Will continue to follow.   Marthenia Rolling, MSN-Ed, RN,BSN [facility] Liaison [phone]

## 2016-03-14 ENCOUNTER — Inpatient Hospital Stay (HOSPITAL_COMMUNITY): Payer: Medicare Other

## 2016-03-14 MED ORDER — IPRATROPIUM-ALBUTEROL 0.5-2.5 (3) MG/3ML IN SOLN
3.0000 mL | Freq: Once | RESPIRATORY_TRACT | Status: AC
  Administered 2016-03-14: 3 mL via RESPIRATORY_TRACT
  Filled 2016-03-14: qty 3

## 2016-03-14 MED ORDER — IPRATROPIUM-ALBUTEROL 0.5-2.5 (3) MG/3ML IN SOLN
3.0000 mL | RESPIRATORY_TRACT | Status: DC | PRN
  Filled 2016-03-14: qty 3

## 2016-03-14 MED ORDER — WHITE PETROLATUM GEL
Status: AC
  Administered 2016-03-14: 0.2
  Filled 2016-03-14: qty 1

## 2016-03-14 MED ORDER — VANCOMYCIN HCL IN DEXTROSE 750-5 MG/150ML-% IV SOLN
750.0000 mg | Freq: Once | INTRAVENOUS | Status: AC
  Administered 2016-03-14: 750 mg via INTRAVENOUS
  Filled 2016-03-14: qty 150

## 2016-03-14 NOTE — Progress Notes (Signed)
PROGRESS NOTE                                                                                                                                                                                                             Patient Demographics:    [name], is a 75 y.o. female, DOB - 1941-09-21, [id number]  Admit date - 03/11/2016   Admitting Physician Louellen Molder, MD  Outpatient Primary MD for the patient is REED, [name], DO  LOS - 2  Outpatient Specialists: none  Chief Complaint  Patient presents with  . Leg Pain       Brief Narrative   75 year old female with Parkinson's disease, chronic diastolic CHF, venous stasis dermatitis of both legs, iron deficiency anemia presented with bilateral lower extremity cellulitis for past few weeks worsened over one week.   Subjective:   Pt wheezing and coughing, left leg still red and hot.     Assessment  & Plan :    Principal Problem:  Bilateral cellulitis of lower leg Slowly improving on IV clindamycin.  Also on florastor . Give dose of levaquin IV today.   Recent Doppler of bilateral lower extremities negative for DVT. Keep legs elevated at all times. Tylenol when necessary for fever. increased her home dose of oxycodone for pain. PT recommend skilled nursing facility but patient refuses, says home with HHPT only option.  Active Problems:  Parkinson's disease ([place]) Resume Sinemet.   Diastolic dysfunction, grade 2 Euvolemic. Continue Lasix. Checking CXR today  Wheezing with history of chronic bronchitis  - ordered stat duoneb and prn nebs 6/22   Anemia, iron deficiency Continue iron Supplement.   Venous stasis dermatitis of both lower extremities continue triamcinolone and nystatin ointment, elevating legs and diuresing well  Code Status : DO NOT RESUSCITATE  Family Communication  : None at bedside  Disposition Plan  : Home with Home Health PT,  (Pt refused [name])  Barriers For Discharge : Improving symptoms  Consults  :  None  Procedures  : None  DVT Prophylaxis  :  Lovenox -  Lab Results  Component Value Date   PLT 184 03/11/2016    Antibiotics  :    Anti-infectives    Start     Dose/Rate Route Frequency Ordered Stop   03/11/16 2000  clindamycin (CLEOCIN) IVPB 600 mg  600 mg 100 mL/hr over 30 Minutes Intravenous Every 8 hours 03/11/16 1810     03/11/16 1615  vancomycin (VANCOCIN) IVPB 750 mg/150 ml premix  Status:  Discontinued     750 mg 150 mL/hr over 60 Minutes Intravenous Every 12 hours 03/11/16 1607 03/12/16 0926   03/11/16 1545  sulfamethoxazole-trimethoprim (BACTRIM DS,SEPTRA DS) 800-160 MG per tablet 1 tablet  Status:  Discontinued     1 tablet Oral  Once 03/11/16 1544 03/11/16 1600        Objective:   Filed Vitals:   03/13/16 0520 03/13/16 1535 03/13/16 2142 03/14/16 0532  BP: 110/58 162/73 110/52 150/71  Pulse: 72 71 71 73  Temp: 97.5 F (36.4 C) 97.9 F (36.6 C) 98.2 F (36.8 C) 97.6 F (36.4 C)  TempSrc: Oral Oral    Resp: 18 16 18 18   Height:      Weight:      SpO2: 99% 91% 96% 94%    Wt Readings from Last 3 Encounters:  03/11/16 175 lb 6.4 oz (79.561 kg)  02/22/16 175 lb 2 oz (79.436 kg)  02/15/16 172 lb (78.019 kg)    Intake/Output Summary (Last 24 hours) at 03/14/16 1041 Last data filed at 03/14/16 0914  Gross per 24 hour  Intake    572 ml  Output    475 ml  Net     97 ml    Physical Exam  Gen: not in distress HEENT:  moist mucosa, supple neck Chest: clear b/l, no added sounds CVS: N S1&S2, no murmurs, rubs or gallop GI: soft, NT, ND, BS+ Musculoskeletal: Erythema and swelling of bilateral lower extremity ( L>R), still with a lot of heat in LLE and redness.  Tenderness CNS: alert and  oriented, non focal    Data Review:    CBC  Recent Labs Lab 03/11/16 1522  WBC 7.5  HGB 10.1*  HCT 33.8*  PLT 184  MCV 81.8  MCH 24.5*  MCHC 29.9*  RDW 18.1*  LYMPHSABS  1.4  MONOABS 0.5  EOSABS 0.3  BASOSABS 0.1    Chemistries   Recent Labs Lab 03/11/16 1522  NA 140  K 4.1  CL 110  CO2 22  GLUCOSE 115*  BUN 16  CREATININE 0.82  CALCIUM 8.8*   ------------------------------------------------------------------------------------------------------------------ No results for input(s): CHOL, HDL, LDLCALC, TRIG, CHOLHDL, LDLDIRECT in the last 72 hours.  No results found for: HGBA1C ------------------------------------------------------------------------------------------------------------------ No results for input(s): TSH, T4TOTAL, T3FREE, THYROIDAB in the last 72 hours.  Invalid input(s): FREET3 ------------------------------------------------------------------------------------------------------------------ No results for input(s): VITAMINB12, FOLATE, FERRITIN, TIBC, IRON, RETICCTPCT in the last 72 hours.  Coagulation profile No results for input(s): INR, PROTIME in the last 168 hours.  No results for input(s): DDIMER in the last 72 hours.  Cardiac Enzymes No results for input(s): CKMB, TROPONINI, MYOGLOBIN in the last 168 hours.  Invalid input(s): CK ------------------------------------------------------------------------------------------------------------------ No results found for: BNP  Inpatient Medications  Scheduled Meds: . carbidopa-levodopa  1 tablet Oral TID  . carbidopa-levodopa  1.5 tablet Oral TID  . clindamycin (CLEOCIN) IV  600 mg Intravenous Q8H  . docusate sodium  100 mg Oral Daily  . enoxaparin (LOVENOX) injection  40 mg Subcutaneous Q24H  . ferrous sulfate  325 mg Oral Daily  . furosemide  40 mg Oral Daily  . ipratropium-albuterol  3 mL Nebulization Once  . latanoprost  1 drop Left Eye q1800  . multivitamin with minerals  1 tablet Oral Daily  . polyvinyl alcohol   Right Eye  q1800  . pramipexole  1 mg Oral TID  . saccharomyces boulardii  250 mg Oral BID  . triamcinolone cream  1 application Topical BID    Continuous Infusions:  PRN Meds:.acetaminophen **OR** acetaminophen, HYDROcodone-acetaminophen, ipratropium-albuterol, LORazepam, Melatonin, oxyCODONE  Micro Results No results found for this or any previous visit (from the past 240 hour(s)).  Radiology Reports Ct Abdomen Pelvis W Contrast  02/23/2016  CLINICAL DATA:  Blood in stool for 3-4 weeks, right lower quadrant and mid abdominal pain EXAM: CT ABDOMEN AND PELVIS WITH CONTRAST TECHNIQUE: Multidetector CT imaging of the abdomen and pelvis was performed using the standard protocol following bolus administration of intravenous contrast. CONTRAST:  15mL ISOVUE-300 IOPAMIDOL (ISOVUE-300) INJECTION 61% COMPARISON:  08/07/2014 FINDINGS: Lower chest: Moderate to large hiatal hernia unchanged. Mild scarring or atelectasis left lung base stable from prior study. Lung bases otherwise clear. Stable mild cardiac enlargement. Hepatobiliary: Numerous hepatic cysts are stable including 1 in the dome measuring about 1.5 cm that is partially calcified. Gallbladder appears relatively contracted. Pancreas: Neck Spleen: Negative Adrenals/Urinary Tract: Adrenal glands are normal. Right kidney is normal. There are few small left renal lesions measuring less than or equal to a cm. The largest is about 1 cm in the lower pole and is of low-attenuation. There is an exophytic 11 mm lesion arising off the anterior lower pole with average attenuation value of 88. These mildly larger than it was previously. It was previously of attenuation value 64. No hydronephrosis. Bladder decompressed and also obscured by beam artifact related to right hip replacement. Stomach/Bowel: Nonobstructive bowel gas pattern. No abnormalities appreciated involving small or large bowel. Appendix is normal. Moderate to large hiatal hernia. Vascular/Lymphatic: Atherosclerotic aortoiliac calcification. No acute findings. Reproductive: Reproductive organs not well characterized, largely obscured by beam  attenuation artifact related to right hip replacement. Other: No ascites appreciated Musculoskeletal: Right hip replacement. T12 kyphoplasty. Stable appearance at this level. Moderate T11 compression deformity new from the prior study with a somewhat acute appearance. Severe T8 compression deformity is stable. Fat containing anterior abdominal wall hernia is stable. IMPRESSION: Stable hepatic cysts. Largely stable renal cysts on the left. However, there is an 11 mm indeterminate exophytic lower pole left renal lesion. It shows increased size and attenuation compared to prior study. It may represent a complicated cyst, but a mass is not excluded. Nonemergent renal ultrasound suggested. Moderate to large hiatal hernia. No other significant bowel abnormalities. Moderate T11 compression deformity appears somewhat acute Limited evaluation of pelvic organs and pelvic portion of bowel due to beam attenuation artifact from right hip replacement. Electronically Signed   By: Skipper Cliche M.D.   On: 02/23/2016 15:35    Time Spent in minutes  22   Irwin Brakeman M.D on 03/14/2016 at 10:41 AM  Between 7am to 7pm - Pager - [phone]  After 7pm go to [email or web address] - password [facility]  [facility] -  Office  [phone]

## 2016-03-15 ENCOUNTER — Other Ambulatory Visit: Payer: Self-pay | Admitting: Licensed Clinical Social Worker

## 2016-03-15 MED ORDER — SACCHAROMYCES BOULARDII 250 MG PO CAPS: 250.0000 mg | ORAL_CAPSULE | Freq: Two times a day (BID) | ORAL | Status: DC

## 2016-03-15 MED ORDER — CLINDAMYCIN HCL 300 MG PO CAPS: 300.0000 mg | ORAL_CAPSULE | Freq: Three times a day (TID) | ORAL | Status: DC

## 2016-03-15 NOTE — Care Management Important Message (Signed)
Important Message  Patient Details  Name: [name] MRN: [id number] Date of Birth: [birth year]   Medicare Important Message Given:  Yes    Nathen May 03/15/2016, 11:30 AM

## 2016-03-15 NOTE — Patient Outreach (Signed)
[facility]) Care Management  03/15/2016  [name] 1940-10-06 [id number]   Assessment- CSW awaiting hospital discharge to ensure a safe and stable transition back home with services and support that she needs. [facility] RNCM and CSW have been updated on admission.  Plan-CSW will await for hospital D/C.  [name], BSW, MSW, [email or web address] Phone: [phone] Fax: [phone]

## 2016-03-15 NOTE — Discharge Summary (Signed)
Physician Discharge Summary  [name] [id number] DOB: [birth year] DOA: 03/11/2016  PCP: Hollace Kinnier, DO  Admit date: 03/11/2016 Discharge date: 03/15/2016  Admitted From:  Home Disposition:  Pt refused [name] placement, discharged home with Home health services  Recommendations for Outpatient Follow-up:  1. Follow up with PCP in 1 weeks  Home Health: Yes  Discharge Condition: Stable  CODE STATUS: DNR   Brief/Interim Summary: Admission: [name] is a 75 y.o. female, With history of Parkinson's disease (uses walker at home), chronic diastolic CHF, history of stasis dermatitis of bilateral legs, GERD, history of upper GI bleed, hypertension, hypothyroidism and history of stroke, iron deficiency anemia presented with bilateral lower extremity redness and swelling. Patient reports having persistent left leg swelling with erythema for almost a month. As per PCP note she has stasis dermatitis of bilateral legs but was noted to have increased swelling that has now worsened over the past 1 week and is associated with increased pain. She denies any fevers, chills, nausea, vomiting, bowel or urinary symptoms. She denies chest pain, shortness of breath, headache, blurred vision, numbness or tingling in her extremities. She denies any, or any insect bite. When seen by her PCP on 6/1 she was instructed to use compression stockings for her leg swelling (consider due to venous stasis dermatitis) and prescribed nystatin/triamcinolone cream. Home health on 6/14 noted that left lower leg had significantly increased in size and was erythematous. She had a Doppler of bilateral lower extremity which was negative for DVT or superficial thrombosis, showed interstitial fluid throughout her left lower leg.Marland Kitchen Her PCP had called in for a prescription for Bactrim on 6/16 but patient reports that she never filled them. Patient presented to the ED with worsening symptoms.  Course in the ED Vitals were stable. Blood  work showed hemoglobin of 10.1, normal chemistry and lactic acid. Patient given a dose of IV vancomycin and hospitalists admission requested for observation on IV antibiotics.  Bilateral cellulitis of lower leg Slowly improved on IV clindamycin in hospital. Also on florastor.  Sent home on oral clindamycin.   Recent Doppler of bilateral lower extremities negative for DVT. Keep legs elevated at all times. Tylenol when necessary for fever. increased her home dose of oxycodone for pain. PT recommend skilled nursing facility but patient refuses, says home with HHPT only option. Follow up with PCP to recheck for full resolution.   Parkinson's disease ([name]) Resume Sinemet.   Diastolic dysfunction, grade 2 Euvolemic. Continue Lasix.  Wheezing with history of chronic bronchitis  - ordered stat duoneb and prn nebs 6/22   Anemia, iron deficiency Continue iron Supplement.  Venous stasis dermatitis of both lower extremities continue triamcinolone and nystatin ointment, elevating legs and diuresing well  Code Status : DO NOT RESUSCITATE  Family Communication: None at bedside  Disposition Plan : Home with Home Health PT, (Pt refused [name])  Barriers For Discharge : Improving symptoms  Consults : None  Procedures : None  DVT Prophylaxis : Lovenox -  Discharge Diagnoses:  Principal Problem:   Bilateral cellulitis of lower leg Active Problems:   Parkinson's disease ([place])   Essential hypertension   Diastolic dysfunction, grade 2   Anemia, iron deficiency   Physical deconditioning   Venous stasis dermatitis of both lower extremities  Discharge Instructions  Discharge Instructions    Diet - low sodium heart healthy    Complete by:  As directed      Increase activity slowly    Complete by:  As directed  Medication List    STOP taking these medications        hydrocortisone 1 % lotion     nystatin cream  Commonly known as:  MYCOSTATIN      sulfamethoxazole-trimethoprim 800-160 MG tablet  Commonly known as:  BACTRIM DS,SEPTRA DS     triamcinolone cream 0.1 %  Commonly known as:  KENALOG      TAKE these medications        carbidopa-levodopa 25-100 MG tablet  Commonly known as:  SINEMET IR  Take 1 at 6a, 1 1/2 pills at [place], 1 at noon, 1 1/2 pills at 3p, 1 at 6pm, 1 1/2 pills at 9pm     clindamycin 300 MG capsule  Commonly known as:  CLEOCIN  Take 1 capsule (300 mg total) by mouth 3 (three) times daily.     docusate sodium 100 MG capsule  Commonly known as:  COLACE  Take 100 mg by mouth daily. Reported on 02/28/2016     ferrous sulfate 325 (65 FE) MG EC tablet  Take 1 tablet (325 mg total) by mouth daily.     furosemide 40 MG tablet  Commonly known as:  LASIX  Take 40 mg by mouth 2 (two) times daily. Patient to increase to BID x 3 days then resume once daily on 02-16-16     latanoprost 0.005 % ophthalmic solution  Commonly known as:  XALATAN  Place 1 drop into the left eye daily at 6 PM.     LORazepam 0.5 MG tablet  Commonly known as:  ATIVAN  Take 1 tablet (0.5 mg total) by mouth 2 (two) times daily as needed for anxiety.     Melatonin 5 MG Tabs  Take one tablet by mouth one hour before bedtime for sleep     multivitamin with minerals tablet  Take 1 tablet by mouth daily.     oxyCODONE 5 MG immediate release tablet  Commonly known as:  Oxy IR/ROXICODONE  Take 1 tablet (5 mg total) by mouth 2 (two) times daily as needed for severe pain.     pramipexole 1 MG tablet  Commonly known as:  MIRAPEX  Take 1 tablet (1 mg total) by mouth 3 (three) times daily.     saccharomyces boulardii 250 MG capsule  Commonly known as:  FLORASTOR  Take 1 capsule (250 mg total) by mouth 2 (two) times daily.     SYSTANE OP  Place 1 drop into the right eye daily at 6 PM.     Vitamin D 2000 units Caps  Take 1 capsule (2,000 Units total) by mouth daily.           Follow-up Information    Follow up with [facility].   Specialty:  Home Health Services   Why:  [facility]. Will continue to follow for [facility] needs. (patient declined [name])   Contact information:   [name] [phone]       Follow up with REED, [name], DO. Schedule an appointment as soon as possible for a visit in 1 week.   Specialty:  Geriatric Medicine   Why:  Hospital Follow Up    Contact information:   [place]. [place] [address] [phone]      Allergies  Allergen Reactions  . Codeine Other (See Comments)    Unknown allergic reaction  . Penicillins Other (See Comments)    Pt passed out in doctor's office after penicillin dose  Procedures/Studies: Ct Abdomen Pelvis W Contrast  02/23/2016  CLINICAL DATA:  Blood in stool for 3-4 weeks, right lower quadrant and mid abdominal pain EXAM: CT ABDOMEN AND PELVIS WITH CONTRAST TECHNIQUE: Multidetector CT imaging of the abdomen and pelvis was performed using the standard protocol following bolus administration of intravenous contrast. CONTRAST:  136mL ISOVUE-300 IOPAMIDOL (ISOVUE-300) INJECTION 61% COMPARISON:  08/07/2014 FINDINGS: Lower chest: Moderate to large hiatal hernia unchanged. Mild scarring or atelectasis left lung base stable from prior study. Lung bases otherwise clear. Stable mild cardiac enlargement. Hepatobiliary: Numerous hepatic cysts are stable including 1 in the dome measuring about 1.5 cm that is partially calcified. Gallbladder appears relatively contracted. Pancreas: Neck Spleen: Negative Adrenals/Urinary Tract: Adrenal glands are normal. Right kidney is normal. There are few small left renal lesions measuring less than or equal to a cm. The largest is about 1 cm in the lower pole and is of low-attenuation. There is an exophytic 11 mm lesion arising off the anterior lower pole with average attenuation value of 88. These mildly larger than it was previously. It was previously of attenuation value 64. No hydronephrosis.  Bladder decompressed and also obscured by beam artifact related to right hip replacement. Stomach/Bowel: Nonobstructive bowel gas pattern. No abnormalities appreciated involving small or large bowel. Appendix is normal. Moderate to large hiatal hernia. Vascular/Lymphatic: Atherosclerotic aortoiliac calcification. No acute findings. Reproductive: Reproductive organs not well characterized, largely obscured by beam attenuation artifact related to right hip replacement. Other: No ascites appreciated Musculoskeletal: Right hip replacement. T12 kyphoplasty. Stable appearance at this level. Moderate T11 compression deformity new from the prior study with a somewhat acute appearance. Severe T8 compression deformity is stable. Fat containing anterior abdominal wall hernia is stable. IMPRESSION: Stable hepatic cysts. Largely stable renal cysts on the left. However, there is an 11 mm indeterminate exophytic lower pole left renal lesion. It shows increased size and attenuation compared to prior study. It may represent a complicated cyst, but a mass is not excluded. Nonemergent renal ultrasound suggested. Moderate to large hiatal hernia. No other significant bowel abnormalities. Moderate T11 compression deformity appears somewhat acute Limited evaluation of pelvic organs and pelvic portion of bowel due to beam attenuation artifact from right hip replacement. Electronically Signed   By: Skipper Cliche M.D.   On: 02/23/2016 15:35   Dg Chest Port 1 View  03/14/2016  CLINICAL DATA:  Wheezing today. EXAM: PORTABLE CHEST 1 VIEW COMPARISON:  PA and lateral chest 08/30/2015 and 04/27/2015. FINDINGS: There is mild cardiomegaly. Small focus of atelectasis or scar in the lingula is noted. Hiatal hernia is identified. No pneumothorax or pleural effusion. IMPRESSION: Cardiomegaly without acute disease. Hiatal hernia. Electronically Signed   By: Inge Rise M.D.   On: 03/14/2016 11:24      Subjective: Pt reports she feels much  better, asking to go home, refusing [name] placement  Discharge Exam: Filed Vitals:   03/14/16 2155 03/15/16 0536  BP: 128/65 141/57  Pulse: 75 63  Temp: 98.4 F (36.9 C) 97.9 F (36.6 C)  Resp: 18 18   Filed Vitals:   03/14/16 1300 03/14/16 1445 03/14/16 2155 03/15/16 0536  BP: 118/84 106/37 128/65 141/57  Pulse: 85 81 75 63  Temp: 98.6 F (37 C) 97.9 F (36.6 C) 98.4 F (36.9 C) 97.9 F (36.6 C)  TempSrc: Oral Oral    Resp: 16 18 18 18   Height:      Weight:      SpO2: 97% 97% 93% 100%  Gen: not in distress HEENT: moist mucosa, supple neck Chest: clear b/l, no added sounds CVS: N S1&S2, no murmurs, rubs or gallop GI: soft, NT, ND, BS+ Musculoskeletal: Erythema and swelling of bilateral lower extremity ( L>R), less redness and edema resolving, RLE nearly completely resolved CNS: alert and oriented, non focal   The results of significant diagnostics from this hospitalization (including imaging, microbiology, ancillary and laboratory) are listed below for reference.     Microbiology: No results found for this or any previous visit (from the past 240 hour(s)).   Labs: BNP (last 3 results) No results for input(s): BNP in the last 8760 hours. Basic Metabolic Panel:  Recent Labs Lab 03/11/16 1522  NA 140  K 4.1  CL 110  CO2 22  GLUCOSE 115*  BUN 16  CREATININE 0.82  CALCIUM 8.8*   Liver Function Tests: No results for input(s): AST, ALT, ALKPHOS, BILITOT, PROT, ALBUMIN in the last 168 hours. No results for input(s): LIPASE, AMYLASE in the last 168 hours. No results for input(s): AMMONIA in the last 168 hours. CBC:  Recent Labs Lab 03/11/16 1522  WBC 7.5  NEUTROABS 5.3  HGB 10.1*  HCT 33.8*  MCV 81.8  PLT 184   Cardiac Enzymes: No results for input(s): CKTOTAL, CKMB, CKMBINDEX, TROPONINI in the last 168 hours. BNP: Invalid input(s): POCBNP CBG: No results for input(s): GLUCAP in the last 168 hours. D-Dimer No results for input(s): DDIMER in  the last 72 hours. Hgb A1c No results for input(s): HGBA1C in the last 72 hours. Lipid Profile No results for input(s): CHOL, HDL, LDLCALC, TRIG, CHOLHDL, LDLDIRECT in the last 72 hours. Thyroid function studies No results for input(s): TSH, T4TOTAL, T3FREE, THYROIDAB in the last 72 hours.  Invalid input(s): FREET3 Anemia work up No results for input(s): VITAMINB12, FOLATE, FERRITIN, TIBC, IRON, RETICCTPCT in the last 72 hours. Urinalysis    Component Value Date/Time   COLORURINE YELLOW 04/27/2015 1416   APPEARANCEUR CLOUDY* 04/27/2015 1416   LABSPEC 1.024 04/27/2015 1416   PHURINE 6.0 04/27/2015 1416   GLUCOSEU NEGATIVE 04/27/2015 1416   HGBUR NEGATIVE 04/27/2015 1416   [place] 04/27/2015 1416   KETONESUR 15* 04/27/2015 1416   PROTEINUR NEGATIVE 04/27/2015 1416   UROBILINOGEN 0.2 04/27/2015 1416   NITRITE NEGATIVE 04/27/2015 1416   LEUKOCYTESUR LARGE* 04/27/2015 1416   Sepsis Labs Invalid input(s): PROCALCITONIN,  WBC,  LACTICIDVEN Microbiology No results found for this or any previous visit (from the past 240 hour(s)).   Time coordinating discharge: 34 minutes  SIGNED:   Irwin Brakeman, MD  [facility] 03/15/2016, 10:41 AM Pager   If 7PM-7AM, please contact night-coverage [email or web address] Password [email or web address]

## 2016-03-15 NOTE — Progress Notes (Signed)
Nsg Discharge Note  Admit Date:  03/11/2016 Discharge date: 03/15/2016   [name] to be D/C'd Home with Home health PT per MD order.  AVS completed.  Copy for chart, and copy for patient signed, and dated. Patient/caregiver able to verbalize understanding.  Discharge Medication:   Medication List    STOP taking these medications        hydrocortisone 1 % lotion     nystatin cream  Commonly known as:  MYCOSTATIN     sulfamethoxazole-trimethoprim 800-160 MG tablet  Commonly known as:  BACTRIM DS,SEPTRA DS     triamcinolone cream 0.1 %  Commonly known as:  KENALOG      TAKE these medications        carbidopa-levodopa 25-100 MG tablet  Commonly known as:  SINEMET IR  Take 1 at 6a, 1 1/2 pills at [place], 1 at noon, 1 1/2 pills at 3p, 1 at 6pm, 1 1/2 pills at 9pm     clindamycin 300 MG capsule  Commonly known as:  CLEOCIN  Take 1 capsule (300 mg total) by mouth 3 (three) times daily.     docusate sodium 100 MG capsule  Commonly known as:  COLACE  Take 100 mg by mouth daily. Reported on 02/28/2016     ferrous sulfate 325 (65 FE) MG EC tablet  Take 1 tablet (325 mg total) by mouth daily.     furosemide 40 MG tablet  Commonly known as:  LASIX  Take 40 mg by mouth 2 (two) times daily. Patient to increase to BID x 3 days then resume once daily on 02-16-16     latanoprost 0.005 % ophthalmic solution  Commonly known as:  XALATAN  Place 1 drop into the left eye daily at 6 PM.     LORazepam 0.5 MG tablet  Commonly known as:  ATIVAN  Take 1 tablet (0.5 mg total) by mouth 2 (two) times daily as needed for anxiety.     Melatonin 5 MG Tabs  Take one tablet by mouth one hour before bedtime for sleep     multivitamin with minerals tablet  Take 1 tablet by mouth daily.     oxyCODONE 5 MG immediate release tablet  Commonly known as:  Oxy IR/ROXICODONE  Take 1 tablet (5 mg total) by mouth 2 (two) times daily as needed for severe pain.     pramipexole 1 MG tablet  Commonly known  as:  MIRAPEX  Take 1 tablet (1 mg total) by mouth 3 (three) times daily.     saccharomyces boulardii 250 MG capsule  Commonly known as:  FLORASTOR  Take 1 capsule (250 mg total) by mouth 2 (two) times daily.     SYSTANE OP  Place 1 drop into the right eye daily at 6 PM.     Vitamin D 2000 units Caps  Take 1 capsule (2,000 Units total) by mouth daily.        Discharge Assessment: Filed Vitals:   03/15/16 0536 03/15/16 1426  BP: 141/57 155/70  Pulse: 63 81  Temp: 97.9 F (36.6 C) 97.3 F (36.3 C)  Resp: 18 17   Skin clean, dry and intact without evidence of skin break down, no evidence of skin tears noted. IV catheter discontinued intact. Site without signs and symptoms of complications - no redness or edema noted at insertion site, patient denies c/o pain - only slight tenderness at site.  Dressing with slight pressure applied.  D/c Instructions-Education: Discharge instructions given to patient/family  with verbalized understanding. D/c education completed with patient/family including follow up instructions, medication list, d/c activities limitations if indicated, with other d/c instructions as indicated by MD - patient able to verbalize understanding, all questions fully answered. Patient instructed to return to ED, call 911, or call MD for any changes in condition.  Patient escorted via [place], and D/C home via private auto.  Dayle Points, RN 03/15/2016 3:12 PM

## 2016-03-15 NOTE — Progress Notes (Signed)
Physical Therapy Treatment Patient Details Name: BRODIE VOISINE MRN: [id number] DOB: 04/30/1941 Today's Date: 03/15/2016    History of Present Illness Patient is a 75 y/o female with hx of PD, HTN, chronic systolic heart failure, history of stasis dermatitis of bilateral legs and anxiety presents with BLE redness and swelling.    PT Comments    Patient's mobility more fluid today with less rigidity as timing of session was planned around Sinemet. Tolerated gait training with Min A for balance/safety. Continues to have balance deficits and poor safety awareness during mobility. Pt refusing short term [name] despite being high fall risk. Will continue to follow if still in hospital.  Follow Up Recommendations  [name];Supervision for mobility/OOB     Equipment Recommendations  None recommended by PT    Recommendations for Other Services       Precautions / Restrictions Precautions Precautions: Fall Restrictions Weight Bearing Restrictions: No    Mobility  Bed Mobility               General bed mobility comments: Up in chair upon PT arrival.  Transfers Overall transfer level: Needs assistance Equipment used: Rolling walker (2 wheeled) Transfers: Sit to/from Stand Sit to Stand: Min guard         General transfer comment: Min guard for safety due to dyskinesias.   Ambulation/Gait Ambulation/Gait assistance: Min assist Ambulation Distance (Feet): 75 Feet Assistive device: Rolling walker (2 wheeled) Gait Pattern/deviations: Shuffle;Trunk flexed;Step-through pattern Gait velocity: decreased   General Gait Details: Slow, unsteady gait with RW too far anterior. Cues for posture and for RW proximity. Pt searching for neighbor in rooms in hallway and veering right/left. Cues to stay within RW.   Stairs            Wheelchair Mobility    Modified Rankin (Stroke Patients Only)       Balance Overall balance assessment: Needs assistance Sitting-balance support: Feet  supported;No upper extremity supported Sitting balance-Leahy Scale: Fair Sitting balance - Comments: Able to donn shoes sitting EOB and doff socks reaching outside BoS without LOB.   Standing balance support: During functional activity Standing balance-Leahy Scale: Poor Standing balance comment: Reliant on BUEs for support.                    Cognition Arousal/Alertness: Awake/alert Behavior During Therapy: WFL for tasks assessed/performed Overall Cognitive Status: No family/caregiver present to determine baseline cognitive functioning                      Exercises      General Comments General comments (skin integrity, edema, etc.): Pt just had Sinemet prior to PT session.      Pertinent Vitals/Pain Pain Assessment: No/denies pain    Home Living                      Prior Function            PT Goals (current goals can now be found in the care plan section) Progress towards PT goals: Progressing toward goals    Frequency  Min 3X/week    PT Plan Current plan remains appropriate    Co-evaluation             End of Session Equipment Utilized During Treatment: Gait belt Activity Tolerance: Patient tolerated treatment well Patient left: in chair;with call bell/phone within reach;with chair alarm set;with family/visitor present     Time: 1141-1200 PT Time Calculation (min) (ACUTE  ONLY): 19 min  Charges:  $Gait Training: 8-22 mins                    G Codes:      Shavonda Wiedman A Afton Mikelson 03/15/2016, 12:08 PM Wray Kearns, [place], DPT [phone]

## 2016-03-15 NOTE — Discharge Instructions (Signed)
Cellulitis °Cellulitis is an infection of the skin and the tissue under the skin. The infected area is usually red and tender. This happens most often in the arms and lower legs. °HOME CARE  °· Take your antibiotic medicine as told. Finish the medicine even if you start to feel better. °· Keep the infected arm or leg raised (elevated). °· Put a warm cloth on the area up to 4 times per day. °· Only take medicines as told by your doctor. °· Keep all doctor visits as told. °GET HELP IF: °· You see red streaks on the skin coming from the infected area. °· Your red area gets bigger or turns a dark color. °· Your bone or joint under the infected area is painful after the skin heals. °· Your infection comes back in the same area or different area. °· You have a puffy (swollen) bump in the infected area. °· You have new symptoms. °· You have a fever. °GET HELP RIGHT AWAY IF:  °· You feel very sleepy. °· You throw up (vomit) or have watery poop (diarrhea). °· You feel sick and have muscle aches and pains. °  °This information is not intended to replace advice given to you by your health care provider. Make sure you discuss any questions you have with your health care provider. °  °Document Released: 02/26/2008 Document Revised: 05/31/2015 Document Reviewed: 11/25/2011 °Elsevier Interactive Patient Education ©2016 [employer] Inc. ° °

## 2016-03-17 DIAGNOSIS — F0280 Dementia in other diseases classified elsewhere without behavioral disturbance: Secondary | ICD-10-CM | POA: Diagnosis not present

## 2016-03-17 DIAGNOSIS — M6281 Muscle weakness (generalized): Secondary | ICD-10-CM | POA: Diagnosis not present

## 2016-03-17 DIAGNOSIS — L03115 Cellulitis of right lower limb: Secondary | ICD-10-CM | POA: Diagnosis not present

## 2016-03-17 DIAGNOSIS — G20 Parkinson's disease: Secondary | ICD-10-CM | POA: Diagnosis not present

## 2016-03-17 DIAGNOSIS — M545 Low back pain: Secondary | ICD-10-CM | POA: Diagnosis not present

## 2016-03-17 DIAGNOSIS — R441 Visual hallucinations: Secondary | ICD-10-CM | POA: Diagnosis not present

## 2016-03-18 ENCOUNTER — Other Ambulatory Visit: Payer: Self-pay | Admitting: *Deleted

## 2016-03-18 ENCOUNTER — Other Ambulatory Visit: Payer: Self-pay | Admitting: Licensed Clinical Social Worker

## 2016-03-18 ENCOUNTER — Telehealth: Payer: Self-pay | Admitting: *Deleted

## 2016-03-18 DIAGNOSIS — R441 Visual hallucinations: Secondary | ICD-10-CM | POA: Diagnosis not present

## 2016-03-18 DIAGNOSIS — M545 Low back pain: Secondary | ICD-10-CM | POA: Diagnosis not present

## 2016-03-18 DIAGNOSIS — G20 Parkinson's disease: Secondary | ICD-10-CM | POA: Diagnosis not present

## 2016-03-18 DIAGNOSIS — F0280 Dementia in other diseases classified elsewhere without behavioral disturbance: Secondary | ICD-10-CM | POA: Diagnosis not present

## 2016-03-18 DIAGNOSIS — L03115 Cellulitis of right lower limb: Secondary | ICD-10-CM | POA: Diagnosis not present

## 2016-03-18 DIAGNOSIS — M6281 Muscle weakness (generalized): Secondary | ICD-10-CM | POA: Diagnosis not present

## 2016-03-18 NOTE — Patient Outreach (Signed)
[facility]) Care Management [place] Telephone Outreach, Transition of Care Attempt #1 03/18/2016  MARIAELENA KASSMAN 02/01/1941 [id number]  Unsuccessful telephone outreach to [name], 75 y/o female, originally followed by [facility] [facility] on referral from [place] after ED visit 01/19/16 for safety concerns; prior to that, patient had been followed previously by [facility] telephonic health coach.  Patient had recent IP hospital visit June 19-23, 2017, for bilateral LE edema and redness/ cellulitis and was treated with IV antibiotics.  Recommendation was made at the time of IP hospital discharge for [name] placement, but patient refused.  Patient is now followed by [facility] for transition of care after recent hospital admission.    Left HIPAA compliant voicemail message for patient, asking patient to return my call, and reminding patient of our previously scheduled [facility] in- home visit tomorrow.  Plan:    If I do not hear back from patient this afternoon, [facility] in-home visit tomorrow, as previously scheduled.  [name], RN, BSN, [employer] [facility] Care Management  [phone]

## 2016-03-18 NOTE — Telephone Encounter (Signed)
Patient called back and stated that she was in the hospital for 5 days for her leg and was given an antibiotic. Patient has an appointment on Thursday to follow up with Dr. Mariea Clonts, patient was wanting to confirm her appointment and let us know she was in the hospital.

## 2016-03-18 NOTE — Patient Outreach (Signed)
[facility]) Care Management  03/18/2016  SHAREETA SJOBLOM 22-Mar-1941 [id number]   Assessment- Patient recently admitted into hospital and discharged on 03/15/16. CSW completed outreach call to patient and she is agreeable to home visit.  Plan-CSW will complete home visit on 03/19/16.  [name], BSW, MSW, [email or web address] Phone: [phone] Fax: [phone]

## 2016-03-18 NOTE — Telephone Encounter (Signed)
Patient called and left message on voicemail stating that she was in the hospital. I didn't understand the whole message. Called patient and LMOM to return call.

## 2016-03-19 ENCOUNTER — Other Ambulatory Visit: Payer: Self-pay | Admitting: Licensed Clinical Social Worker

## 2016-03-19 ENCOUNTER — Encounter: Payer: Self-pay | Admitting: *Deleted

## 2016-03-19 ENCOUNTER — Other Ambulatory Visit: Payer: Self-pay | Admitting: *Deleted

## 2016-03-19 VITALS — BP 122/68 | HR 70 | Resp 22 | Wt 168.0 lb

## 2016-03-19 DIAGNOSIS — G20 Parkinson's disease: Secondary | ICD-10-CM | POA: Diagnosis not present

## 2016-03-19 DIAGNOSIS — M6281 Muscle weakness (generalized): Secondary | ICD-10-CM | POA: Diagnosis not present

## 2016-03-19 DIAGNOSIS — F0280 Dementia in other diseases classified elsewhere without behavioral disturbance: Secondary | ICD-10-CM | POA: Diagnosis not present

## 2016-03-19 DIAGNOSIS — M545 Low back pain: Secondary | ICD-10-CM | POA: Diagnosis not present

## 2016-03-19 DIAGNOSIS — L03115 Cellulitis of right lower limb: Secondary | ICD-10-CM | POA: Diagnosis not present

## 2016-03-19 DIAGNOSIS — R441 Visual hallucinations: Secondary | ICD-10-CM | POA: Diagnosis not present

## 2016-03-19 NOTE — Patient Outreach (Signed)
[facility]) Care Management  [facility] Social Work  03/19/2016  [name] 02/14/1941 [id number]   Encounter Medications:  Outpatient Encounter Prescriptions as of 03/19/2016  Medication Sig  . carbidopa-levodopa (SINEMET IR) 25-100 MG tablet Take 1 at 6a, 1 1/2 pills at 9a, 1 at noon, 1 1/2 pills at 3p, 1 at 6pm, 1 1/2 pills at 9pm  . Cholecalciferol (VITAMIN D) 2000 units CAPS Take 1 capsule (2,000 Units total) by mouth daily.  . clindamycin (CLEOCIN) 300 MG capsule Take 1 capsule (300 mg total) by mouth 3 (three) times daily.  Marland Kitchen docusate sodium (COLACE) 100 MG capsule Take 100 mg by mouth daily. Reported on 02/28/2016  . ferrous sulfate 325 (65 FE) MG EC tablet Take 1 tablet (325 mg total) by mouth daily.  . furosemide (LASIX) 40 MG tablet Take 40 mg by mouth 2 (two) times daily. Patient to increase to BID x 3 days then resume once daily on 02-16-16  . latanoprost (XALATAN) 0.005 % ophthalmic solution Place 1 drop into the left eye daily at 6 PM.   . LORazepam (ATIVAN) 0.5 MG tablet Take 1 tablet (0.5 mg total) by mouth 2 (two) times daily as needed for anxiety.  . Melatonin 5 MG TABS Take one tablet by mouth one hour before bedtime for sleep  . Multiple Vitamins-Minerals (MULTIVITAMIN WITH MINERALS) tablet Take 1 tablet by mouth daily.  Marland Kitchen oxyCODONE (OXY IR/ROXICODONE) 5 MG immediate release tablet Take 1 tablet (5 mg total) by mouth 2 (two) times daily as needed for severe pain.  Vladimir Faster Glycol-Propyl Glycol (SYSTANE OP) Place 1 drop into the right eye daily at 6 PM.  . pramipexole (MIRAPEX) 1 MG tablet Take 1 tablet (1 mg total) by mouth 3 (three) times daily.  Marland Kitchen saccharomyces boulardii (FLORASTOR) 250 MG capsule Take 1 capsule (250 mg total) by mouth 2 (two) times daily.   No facility-administered encounter medications on file as of 03/19/2016.    Functional Status:  In your present state of health, do you have any difficulty performing the following activities:  03/11/2016 02/16/2016  Hearing? N -  Vision? N -  Difficulty concentrating or making decisions? N -  Walking or climbing stairs? Y -  Dressing or bathing? Y -  Doing errands, shopping? Y -  Conservation officer, nature and eating ? - Y  Using the Toilet? - N  In the past six months, have you accidently leaked urine? - N  Do you have problems with loss of bowel control? - N  Managing your Medications? - N  Managing your Finances? - N  Housekeeping or managing your Housekeeping? - N    Fall/Depression Screening:  PHQ 2/9 Scores 03/19/2016 02/21/2016 01/30/2016 01/26/2016 01/16/2016 12/29/2015 12/15/2015  PHQ - 2 Score 0 0 0 0 0 0 0    Assessment: CSW completed home visit on 03/19/16 and Will from [facility] was present to complete occupational therapy. CSW and occupational therapist discussed the benefits of staying at [facility] for a short period time. Patient is NOT agreeable to [name] placement even after this education. Patient reports dizziness when standing up. Patient denies any numbness, headaches but has experienced aching pain with her back in the last few weeks. Patient reports on a scale from 1-10 she has been at a 7 in the last few weeks but denies any pain at this current time. She reports that the company she unable to name did fix her kitchen sink, new floor in bathroom and  new toilet. She reports that her pain medication is effective. Occupational therapist did many exercises with her during visit and she seemed to do extremely well per occupational therapist. Patient does not always sleeps in her bed stating that she has trouble getting in and out of bed which is a concern for the occupational therapist. Patient denies any loss of appetite. She reports that she is eating 2 to 3 meals per day. Patient was able to answer severe cognitive questions besides stating that it was July instead of June. Patient reports that her brother and niece in law continue to check on her. Patient reports that she is support  to get an aide with home health that will provide assistance with her bathing three times total. Patient still does not have a personal caregiver. CSW encouraged her to discuss options with her church members or to announce it at church to see if there is anyone that could assist her. CSW and occupational therapist are concerned with the overwhelming amount of clutter in the home which could cause a fall. CSW and occupational therapist encouraged her to contact her church or other churches to see if anyone could complete mission work and clean house. CSW also encouraged her to discuss these concerns with her family members to see if they can assist. Occupational therapist has concerns for her environment and the clutter. Occupational therapist pointed out that there is so much on the bed and things are in her way to even get to her bed which seems to be the major issue and not her ability to do so. Occupational therapist suggested that he can send someone out to assist with her learning how to make the bed but that she would need to have someone assist her with cleaning the bedroom for it to be beneficial to her. CSW reviewed upcoming appointments and patient was able to remember upcoming PCP appointment on 03/21/16. Patient is having trouble opening certain pill bottles and we encouraged her to ask her pharmacy to provide her with a different pill bottle that is easier to open. When asked if she could not open it (occupational therapist opened it for her) what would she do and she stated that she would go to her neighbors. Patient benefits from music and it brings much pleasure to her life and reports that her organ is not able to be fixed (someone came to look at it.) CSW discussed other ways to implement socialization and music activities at senior centers.   [facility] CM Care Plan Problem Three        Most Recent Value   Care Plan Problem Three  Lack of caregiver, support and resources.   Role Documenting the  Problem Three  Clinical Social Worker   Care Plan for Problem Three  Active   THN Long Term Goal (31-90) days  Patient will be investigated by APS due to lack of support within the home.    [facility]N Long Term Goal Start Date  01/30/16   Interventions for Problem Three Long Term Goal  CSW has discussed patient with PCP and made report to APS. CSW has completed home evaluation. CSW will await to hear back from APS.   [facility] CM Short Term Goal #1 (0-30 days)  Patient will gain private pay resources within 30 days.   [facility] CM Short Term Goal #1 Start Date  01/30/16   [facility] CM Short Term Goal #1 Met Date  03/19/16   Interventions for Short Term Goal #1  Goal met.    [facility] CM Short Term Goal #2 (0-30 days)  Patient will be educated on appropriate socialization activities within 30 days   [facility] CM Short Term Goal #2 Start Date  01/30/16   [facility] CM Short Term Goal #2 Met Date  03/19/16   Interventions for Short Term Goal #2  CSW educated patient on appropriate ways to implement socialization activities.   [facility] CM Short Term Goal #3 (0-30 days)  Patient will ask family members if they can assist with de cluttering home within 30 days   THN  CM Short Term Goal #3 Start Date  03/19/16   Interventions for Short Term Goal #3  CSW and Home Health have encouraged patient to ask family members to assist her with de cluttering home to free of space and prevent falls.      Plan: CSW will follow up in three weeks and route encounter to PCP in order to update her on patient's status since hospitalization.  [name], BSW, MSW, [email or web address] Phone: [phone] Fax: [phone]

## 2016-03-19 NOTE — Patient Outreach (Addendum)
[facility]) Care Management  [place] Initial Home Visit, Transition of Care, day 1 03/19/2016  LORELI BENZINGER 1940/12/06 [id number]    CHESSICA GILLEAN is an 75 y.o. female previously followed by [facility] on referral from [place] after ED visit 01/19/16 for safety concerns; prior to that, patient had been followed previously by [facility] telephonic CM.  Unfortunately, Ms. Brailsford was admitted to the hospital June 19-23, 2017 for bilateral LE edema and redness (cellulitis), where she was treated with IV antibiotics.  Recommendation was made for patient to be discharged to [name], but patient refused [name] placement.  Today, Ms. Nedley reports that she is "feeling better," and expresses appreciation for her recent hospital care.  Patient noted throughout visit to be inconsistent with reports of her health status, although she was able to respond appropriately to direct questioning about her medications, medical providers, general plan of care, upcoming scheduled appointments, etc.    Home health [facility]) services are currently in place for Ms. Larsen post-hospital discharge.  Ms. Urueta reports that [facility] visited today.  Patient currently reports regular support with her needs through her family members, specifically her brother, whom she states visits her every day to bring her meals.  Patient states that she uses [employer] for transportation. [facility] CSW [name] is actively involved in Ms. Bonvillain care.  (See [facility] CSW notes).  During our home visit today, I identified the following safety issues:  1) Fall Risk/ Living Environment:    Ms. Stallcup living environment is cluttered with bags, boxes, paper, furniture, and medications in her main area of living (her bedroom).  Medication bottles are scattered over the room in various locations.  Mrs. Hearne kitchen sink is piled over the rim with used dishes , and has used pans on the stove (which is turned off).  Kitchen/ bathroom/ bedroom  lighting appears very dim.  Bathroom is very small and unsanitary.  Ms. Piester states "I sure would like some help with the dishes."  Although Ms. Rope uses a walker 100% of the time, her general living environment presents a significant fall hazard. Today, Ms. Froehlich verbalized inconsistent reporting around her history of falls;  during prior [place] outreach, she had reported "no falls," however, today, she reports that she has had "some" falls over the last year, however, stating that she "can't remember" the details of prior falls.   2) Medications: safety, compliance, administration, adherence:  Safety: As noted above, patient's home has medication bottles scattered throughout her main living area.  When asked to gather her medications, patient is able to do so, however, many of the medications she presents she reports not taking, and/or they are expired.  Patient was recently discharged from hospital and all medications have been thoroughly reviewed with patient today.   All medications that were noted be expired were removed from the home and taken to [place] for disposal.   These included:  Bisoprolol 5 mg Rx dated 09/14/2011-- noted patient not currently on medications for HTN Metolazone 2.5 mg dated 10/02/12 Pantoprazole 40 mg dated 05/06/14 Sucralfate 1 G noted expired 10/2014 Cephalexin 500 mg dated 01/20/16 (pill bottle appeared full) Bactrim DS 800/160 dated 03/09/16 (pill bottle appeared full)   Compliance: Most of the medication bottles that Ms. Mory reported not taking, or were expired, were full of medications.  Antibiotic bottles that had been prescribed January 20, 2016 and March 09, 2016, were still full of pills.  Patient reports that she has "run out of her Ecologist) Vitamin D" and that she does not have Florastor 250 mg as ordered at hospital discharge, stating, "I don't even know what that is, so I am not taking it."  Administration/ Adherence: Patient has a  small (approximately 3-inch long) pill box that she fills herself each morning, for the entire day's medications.   The pill box has 7 spaces for filling, of which she uses 6 each day, primarily to accomodate her sinemet dosing.  Ms. Vanvranken has tremors from Parkinson's disease, and has difficulty at times opening her pill bottles; many of her pill bottle lids are pull-open, which she is able to manage without difficulty, but several are twist-off, which she is unable to do.  Therefore, Ms. Difelice states that she "turns the lid just so," where it does not fully close on top of the bottle, and easily opens; however, several pills spilled out of the bottle when she demonstrated her method of opening her twist-top pill bottles to me.  I was able to find several of the "pull up" lids on unused or expired pill bottles, and replaced those that I could of her "twist top"  lids.  Ms. Demske takes carbidopal/ levodopa (sinemet) 6 x day, and her dosing requires that she cut the some of the pills in half.  Ms. Sturm reports "using scissors" to do so.  Today, I provided her with a pill cutter, and taught her how to use the pill cutter, which she was able to do, stating, "that is much easier than using scissors."  Together, we pre-cut approximately 40 of these pills for her future use, and placed them in a empty pill bottle that did not have a label on it; I cleaned/ dried the bottle and re-labelled with a sharpie, putting clear scotch tape over sharpie label so it would not smudge off.   I also gathered all of Ms. Maczka current prescriptions and she found a small unused plastic tub that I cleaned and dried, to put her medications into, so they would be in one centralized medication area together.  Ms. Felgar agreed to try to to keep all of her medications in this area so that she will have them together, and can take with her to her doctor appointments if she places the plastic bin into a plastic bag.    As we were going  through her medications today, Ms. Andring stated that she was "not sure" why she is no longer taking her "heart and BP medicine," when we came across the expired Bisoprolol which was prescribed in 2012 (no provider name noted on bottle).  Ms. Reising states that she will ask Dr. Mariea Clonts about this when she visits her this week on Thursday March 21, 2016, and kept the now empty bottle to take with her to the visit to show to Dr. Mariea Clonts.  Ms. Lewter was able to accurately share her scheduled upcoming provider appointments when I compared to her schedule in the EMR, and voiced plans to attend these appointments.  Ms. Stegeman states that her brother will be taking her to see Dr. Mariea Clonts this Thursday (03/21/16).   Subjective: "I could not go to the nursing home when I left the hospital.  I just want to be at my own home."  Objective:    BP 122/68 mmHg  Pulse 70  Resp 22  Wt 168 lb (76.204 kg)  SpO2 98%   Review of Systems  Constitutional: Negative for  fever and weight loss.  HENT: Negative.   Eyes: Negative.   Respiratory: Positive for shortness of breath and wheezing. Negative for cough and sputum production.        Patient is slightly short of breath with activity of walking around her home  Cardiovascular: Positive for leg swelling. Negative for chest pain.       Patient has +1-2 edema noted in bilateral LE  Gastrointestinal: Positive for blood in stool. Negative for nausea and abdominal pain.       Patient reports a history of having blood in her stool; states "they haven't figured out why yet."   Genitourinary: Negative.   Musculoskeletal: Positive for back pain and falls.  Skin:       Bilateral LE are red/ erythematous but have no drainage; patient is wearing compression stockings today on both (R) and (L) LE  Neurological: Positive for tremors. Negative for weakness.       Patient has history of Parkinson's Disease  Psychiatric/Behavioral: Negative for depression. The patient is not  nervous/anxious and does not have insomnia.     Physical Exam  Constitutional: She is oriented to person, place, and time. She appears well-developed and well-nourished.  Cardiovascular: Normal rate, regular rhythm, normal heart sounds and intact distal pulses.   Respiratory: Effort normal. No respiratory distress. She has wheezes. She has no rales.  Bilateral breath sounds are diminished throughout A/L/P lung fields; patient is short of breath walking around her home, but recovers quickly when she sits down  GI: Soft. Bowel sounds are normal.  Musculoskeletal: She exhibits edema.  Neurological: She is alert and oriented to person, place, and time.  Skin: Skin is warm and dry. There is erythema.  Erythema noted in bilateral LE  Psychiatric: She has a normal mood and affect. Her behavior is normal.    Encounter Medications:   Outpatient Encounter Prescriptions as of 03/19/2016  Medication Sig Note  . carbidopa-levodopa (SINEMET IR) 25-100 MG tablet Take 1 at 6a, 1 1/2 pills at 9a, 1 at noon, 1 1/2 pills at 3p, 1 at 6pm, 1 1/2 pills at 9pm   . clindamycin (CLEOCIN) 300 MG capsule Take 1 capsule (300 mg total) by mouth 3 (three) times daily.   Marland Kitchen docusate sodium (COLACE) 100 MG capsule Take 100 mg by mouth daily. Reported on 02/28/2016 03/19/2016: Takes only as needed   . ferrous sulfate 325 (65 FE) MG EC tablet Take 1 tablet (325 mg total) by mouth daily.   . furosemide (LASIX) 40 MG tablet Take 40 mg by mouth 2 (two) times daily. Patient to increase to BID x 3 days then resume once daily on 02-16-16 03/19/2016: Patient taking once/ day, as directed starting 02/16/16  . latanoprost (XALATAN) 0.005 % ophthalmic solution Place 1 drop into the left eye daily at 6 PM.    . LORazepam (ATIVAN) 0.5 MG tablet Take 1 tablet (0.5 mg total) by mouth 2 (two) times daily as needed for anxiety.   . Melatonin 5 MG TABS Take one tablet by mouth one hour before bedtime for sleep 03/19/2016: Takes when needed  .  Multiple Vitamins-Minerals (MULTIVITAMIN WITH MINERALS) tablet Take 1 tablet by mouth daily.   Marland Kitchen oxyCODONE (OXY IR/ROXICODONE) 5 MG immediate release tablet Take 1 tablet (5 mg total) by mouth 2 (two) times daily as needed for severe pain.   Vladimir Faster Glycol-Propyl Glycol (SYSTANE OP) Place 1 drop into the right eye daily at 6 PM.   . pramipexole (MIRAPEX) 1  MG tablet Take 1 tablet (1 mg total) by mouth 3 (three) times daily.   . Cholecalciferol (VITAMIN D) 2000 units CAPS Take 1 capsule (2,000 Units total) by mouth daily. (Patient not taking: Reported on 03/19/2016) 03/19/2016: Patient states that she has run out of Vitamin D  . saccharomyces boulardii (FLORASTOR) 250 MG capsule Take 1 capsule (250 mg total) by mouth 2 (two) times daily. (Patient not taking: Reported on 03/19/2016) 03/19/2016: Patient states, "I don't know what that is.  I am not taking that."   No facility-administered encounter medications on file as of 03/19/2016.    Functional Status:   In your present state of health, do you have any difficulty performing the following activities: 03/11/2016 02/16/2016  Hearing? N -  Vision? N -  Difficulty concentrating or making decisions? N -  Walking or climbing stairs? Y -  Dressing or bathing? Y -  Doing errands, shopping? Y -  Conservation officer, nature and eating ? - Y  Using the Toilet? - N  In the past six months, have you accidently leaked urine? - N  Do you have problems with loss of bowel control? - N  Managing your Medications? - N  Managing your Finances? - N  Housekeeping or managing your Housekeeping? - N    Fall/Depression Screening:    PHQ 2/9 Scores 03/19/2016 02/21/2016 01/30/2016 01/26/2016 01/16/2016 12/29/2015 12/15/2015  PHQ - 2 Score 0 0 0 0 0 0 0    Assessment:  Safety issues around fall risk and medications were identified during today's [place] home visit with Ms. Breyette.  Ms. Dacko has several challenges around her medication administration, including symptoms from her  Parkinson's Disease which prevent her from being able to open her pill bottles; additionally, she appears to have been non compliant with some of her medications in the past, although she reports today that she takes all of her medications as they are prescribed.  Ms. Venteicher has several community resource/ personal support needs and [facility] CSW is actively participating in her care to address these issues.  Plan:   Ms. Insley will keep all of her medications in the one centralized box, and will take her medications with her to her upcoming provider appointments by placing the plastic tub (box) in a plastic bag.  [facility] referral placed.  Ms. Maric will ask her PCP about the need for her to take "BP and Heart medicine," as well as the Florastor which was prescribed at the time of hospital discharge, but she has not been taking.  Ms. Lawwill will take her ALL of her prescribed medications as they are prescribed and will keep her scheduled provider appointments.  Ms. Malafronte will continue actively working with [facility] services as ordered and with [facility] CSW.  Ms. Riff will notify her providers for any concerns, issues, problems, or questions that arise.  Continued [facility] CM telephone outreach scheduled for next week, with home visit scheduled in 2 weeks.  I appreciate the opportunity to participate in Ms. Bullard's care,   [name], RN, BSN, [place] Coordinator [facility] Care Management  [phone]

## 2016-03-20 DIAGNOSIS — L03115 Cellulitis of right lower limb: Secondary | ICD-10-CM | POA: Diagnosis not present

## 2016-03-20 DIAGNOSIS — F0280 Dementia in other diseases classified elsewhere without behavioral disturbance: Secondary | ICD-10-CM | POA: Diagnosis not present

## 2016-03-20 DIAGNOSIS — R441 Visual hallucinations: Secondary | ICD-10-CM | POA: Diagnosis not present

## 2016-03-20 DIAGNOSIS — M6281 Muscle weakness (generalized): Secondary | ICD-10-CM | POA: Diagnosis not present

## 2016-03-20 DIAGNOSIS — G20 Parkinson's disease: Secondary | ICD-10-CM | POA: Diagnosis not present

## 2016-03-20 DIAGNOSIS — M545 Low back pain: Secondary | ICD-10-CM | POA: Diagnosis not present

## 2016-03-21 ENCOUNTER — Encounter: Payer: Self-pay | Admitting: Internal Medicine

## 2016-03-21 ENCOUNTER — Ambulatory Visit (INDEPENDENT_AMBULATORY_CARE_PROVIDER_SITE_OTHER): Payer: Medicare Other | Admitting: Internal Medicine

## 2016-03-21 ENCOUNTER — Other Ambulatory Visit: Payer: Self-pay | Admitting: Licensed Clinical Social Worker

## 2016-03-21 VITALS — BP 122/80 | HR 72 | Temp 97.3°F | Wt 172.0 lb

## 2016-03-21 DIAGNOSIS — I872 Venous insufficiency (chronic) (peripheral): Secondary | ICD-10-CM

## 2016-03-21 DIAGNOSIS — L03116 Cellulitis of left lower limb: Secondary | ICD-10-CM | POA: Diagnosis not present

## 2016-03-21 DIAGNOSIS — K5900 Constipation, unspecified: Secondary | ICD-10-CM

## 2016-03-21 DIAGNOSIS — L03115 Cellulitis of right lower limb: Secondary | ICD-10-CM | POA: Diagnosis not present

## 2016-03-21 DIAGNOSIS — F0280 Dementia in other diseases classified elsewhere without behavioral disturbance: Secondary | ICD-10-CM | POA: Diagnosis not present

## 2016-03-21 DIAGNOSIS — D509 Iron deficiency anemia, unspecified: Secondary | ICD-10-CM

## 2016-03-21 DIAGNOSIS — R441 Visual hallucinations: Secondary | ICD-10-CM | POA: Diagnosis not present

## 2016-03-21 DIAGNOSIS — G20 Parkinson's disease: Secondary | ICD-10-CM | POA: Diagnosis not present

## 2016-03-21 DIAGNOSIS — I8311 Varicose veins of right lower extremity with inflammation: Secondary | ICD-10-CM

## 2016-03-21 DIAGNOSIS — M6281 Muscle weakness (generalized): Secondary | ICD-10-CM | POA: Diagnosis not present

## 2016-03-21 DIAGNOSIS — I8312 Varicose veins of left lower extremity with inflammation: Secondary | ICD-10-CM

## 2016-03-21 DIAGNOSIS — I5042 Chronic combined systolic (congestive) and diastolic (congestive) heart failure: Secondary | ICD-10-CM | POA: Diagnosis not present

## 2016-03-21 DIAGNOSIS — M545 Low back pain: Secondary | ICD-10-CM | POA: Diagnosis not present

## 2016-03-21 DIAGNOSIS — K5909 Other constipation: Secondary | ICD-10-CM

## 2016-03-21 DIAGNOSIS — G20A1 Parkinson's disease without dyskinesia, without mention of fluctuations: Secondary | ICD-10-CM

## 2016-03-21 NOTE — Progress Notes (Signed)
Location:  [facility] [place] clinic Provider: Yatzary Merriweather L. Mariea Clonts, D.O., C.M.D.  Code Status: DNR Goals of Care:  Advanced Directives 03/21/2016  Does patient have an advance directive? Yes  Type of Advance Directive Out of facility DNR (pink MOST or yellow form)  Copy of advanced directive(s) in chart? Yes  Pre-existing out of facility DNR order (yellow form or pink MOST form) Pink MOST form placed in chart (order not valid for inpatient use)     Chief Complaint  Patient presents with  . Hospitalization Follow-up    cellulitis of lower leg    [name] seen today for hospital follow-up s/p admission from 6/19-6/23 for left lower leg increased swelling and redness as per home health nurse.  I ordered a doppler which was negative for DVT.  I had sent in a prescription for bactrim, but patient never took it (had at home when [facility] visited).  She went to the ED 6/19.   She was treated with IV vanc and she was admitted.  This was apparently switched to IV clindamycin which was continued po at discharge along with florastor.  Her home oxycodone was increased. Skilled facility was of course recommended and she refused as usual.  Agreed to home health PT.  Triamcinolone/nystatin ointment and elevating legs continued.  [facility] has been out to see her and [facility] was consulted.  Redness and swelling of leg is better.  Still red in both legs as is chronic.  Home health nurse told her not to use the cream I ordered so advised to restart it after cleansing her legs.    Prunes are helping her, 3 per day.    Appears diuretic was reduced during hospital stay from 40mg  po bid to 20mg  po daily.  Weight is down 3 lbs since 6/19.  Now 172 lbs.    ST is going to come out from home health to work with her also.  Has appt 7/25 for cataract removal coming up--Dr. Tommy Rainwater.    Past Medical History  Diagnosis Date  . Parkinson's disease   . Hypertension   . Arthritis   . Anemia   . Cellulitis of lower leg  12/24/2011  . Chronic systolic heart failure ([employer])   . Acute on chronic systolic heart failure ([place])   . Cellulitis and abscess of leg, except foot   . Hypotension, unspecified   . Helicobacter pylori (H. pylori)   . CHF (congestive heart failure) ([place])   . Anxiety   . Cataract left  . Glaucoma left  . GERD (gastroesophageal reflux disease)   . Hypercholesterolemia   . Chronic bronchitis ([place])     "usually get it q yr"  . Sleep apnea     "had test years ago; insurance wouldn't pay for mask so I never had one" (04/27/2015)  . Hypothyroidism   . History of blood transfusion     "don't remember why"  . History of bleeding peptic ulcer   . Stroke [facility]) X 3    "that  was the reason I got Parkinson's"    Past Surgical History  Procedure Laterality Date  . Hemiarthroplasty hip Right 09/24/2007    Archie Endo 01/23/2011  . Thyroidectomy    . Esophagogastroduodenoscopy  09/26/2011    Procedure: ESOPHAGOGASTRODUODENOSCOPY (EGD);  Surgeon: Zenovia Jarred, MD;  Location: [facility] ENDOSCOPY;  Service: Gastroenterology;  Laterality: N/A;  To be done at bedside.  . Esophagogastroduodenoscopy N/A 09/30/2013    Procedure: ESOPHAGOGASTRODUODENOSCOPY (EGD);  Surgeon: Quillian Quince  Merrily Brittle, MD;  Location[facility] ENDOSCOPY;  Service: Endoscopy;  Laterality: N/A;  . Esophagogastroduodenoscopy N/A 11/25/2013    Procedure: ESOPHAGOGASTRODUODENOSCOPY (EGD);  Surgeon: [name], MD;  Location: [facility] ENDOSCOPY;  Service: Endoscopy;  Laterality: N/A;  . Tonsillectomy    . Nissen fundoplication      "had OR for GERD"  . Tendon repair      Gluteus medius [facility] Endo 01/23/2011  . Back surgery    . Kyphoplasty  06/2009    T12/notes 07/17/2009    Allergies  Allergen Reactions  . Codeine Other (See Comments)    Unknown allergic reaction  . Penicillins Other (See Comments)    Pt passed out in doctor's office after penicillin dose      Medication List       This list is accurate as of: 03/21/16 11:15 AM.  Always use your most recent med  list.               carbidopa-levodopa 25-100 MG tablet  Commonly known as:  SINEMET IR  Take 1 at 6a, 1 1/2 pills at [place], 1 at noon, 1 1/2 pills at 3p, 1 at 6pm, 1 1/2 pills at 9pm     clindamycin 300 MG capsule  Commonly known as:  CLEOCIN  Take 1 capsule (300 mg total) by mouth 3 (three) times daily.     docusate sodium 100 MG capsule  Commonly known as:  COLACE  Take 100 mg by mouth daily. Reported on 02/28/2016     ferrous sulfate 325 (65 FE) MG EC tablet  Take 1 tablet (325 mg total) by mouth daily.     furosemide 20 MG tablet  Commonly known as:  LASIX  Take 20 mg by mouth daily.     latanoprost 0.005 % ophthalmic solution  Commonly known as:  XALATAN  Place 1 drop into the left eye daily at 6 PM.     LORazepam 0.5 MG tablet  Commonly known as:  ATIVAN  Take 1 tablet (0.5 mg total) by mouth 2 (two) times daily as needed for anxiety.     Melatonin 5 MG Tabs  Take one tablet by mouth one hour before bedtime for sleep     multivitamin with minerals tablet  Take 1 tablet by mouth daily.     oxyCODONE 5 MG immediate release tablet  Commonly known as:  Oxy IR/ROXICODONE  Take 1 tablet (5 mg total) by mouth 2 (two) times daily as needed for severe pain.     pramipexole 1 MG tablet  Commonly known as:  MIRAPEX  Take 1 tablet (1 mg total) by mouth 3 (three) times daily.     SYSTANE OP  Place 1 drop into the right eye daily at 6 PM.     Vitamin D 2000 units Caps  Take by mouth daily.        Review of Systems:  Review of Systems  Constitutional: Negative for fever and chills.  HENT: Negative for congestion.   Eyes: Positive for blurred vision.       Has cataracts, glaucoma  Respiratory: Positive for shortness of breath. Negative for cough.   Cardiovascular: Positive for leg swelling. Negative for chest pain and palpitations.  Gastrointestinal: Positive for constipation. Negative for blood in stool and melena.  Genitourinary: Negative for dysuria.    Musculoskeletal: Positive for falls.  Skin: Negative for rash.  Neurological: Positive for tremors. Negative for dizziness and loss of consciousness.  Psychiatric/Behavioral: Positive for hallucinations and  memory loss. Negative for depression.    Health Maintenance  Topic Date Due  . TETANUS/TDAP  06/07/1959  . COLONOSCOPY  06/06/1990  . ZOSTAVAX  06/06/2000  . DEXA SCAN  06/06/2005  . MAMMOGRAM  11/22/2011  . INFLUENZA VACCINE  04/23/2016  . PNA vac Low Risk Adult  Completed    Physical Exam: Filed Vitals:   03/21/16 1107  BP: 122/80  Pulse: 72  Temp: 97.3 F (36.3 C)  TempSrc: Oral  Weight: 172 lb (78.019 kg)  SpO2: 98%   Body mass index is 29.51 kg/(m^2). Physical Exam  Constitutional: She appears well-nourished.  Cardiovascular: Normal rate, regular rhythm, normal heart sounds and intact distal pulses.   Pulmonary/Chest: Effort normal and breath sounds normal. She has no rales.  Abdominal: Soft. Bowel sounds are normal.  Musculoskeletal:  Walks with rollator walker  Neurological:  Tremulous, speech worsening (also due for her sinemet at 12noon)  Skin: Skin is warm and dry. There is erythema.  Bilateral lower legs are pink, old lines from cellulitis borders near knees, pinkness is midshin downward, nontender, only mildly edematous  Psychiatric: She has a normal mood and affect.    Labs reviewed: Basic Metabolic Panel:  Recent Labs  04/23/15 2025  04/27/15 2112  01/20/16 0036 02/15/16 1506 03/11/16 1522  NA  --   < >  --   < > 145 142 140  K  --   < >  --   < > 4.6 4.6 4.1  CL  --   < >  --   < > 114* 104 110  CO2  --   < >  --   < > 22 19 22   GLUCOSE  --   < >  --   < > 103* 91 115*  BUN  --   < >  --   < > 19 21 16   CREATININE 0.98  < >  --   < > 0.86 0.89 0.82  CALCIUM  --   < >  --   < > 8.6* 9.0 8.8*  MG 1.9  --  2.1  --   --   --   --   PHOS 3.7  --   --   --   --   --   --   TSH  --   --  2.277  --   --   --   --   < > = values in this  interval not displayed. Liver Function Tests:  Recent Labs  04/03/15 1350 04/24/15 0609 04/27/15 1410  AST 18 30 24   ALT 7 6* 24  ALKPHOS 66 52 67  BILITOT 0.4 1.0 0.8  PROT 5.8* 5.3* 6.2*  ALBUMIN 3.8 2.9* 3.4*   No results for input(s): LIPASE, AMYLASE in the last 8760 hours. No results for input(s): AMMONIA in the last 8760 hours. CBC:  Recent Labs  04/28/15 0534  01/20/16 0036 02/15/16 1506 03/11/16 1522  WBC 7.8  < > 8.1 7.8 7.5  NEUTROABS  --   < > 6.0 5.8 5.3  HGB 13.0  --  10.2*  --  10.1*  HCT 40.0  < > 33.3* 34.4 33.8*  MCV 86.2  < > 80.8 79 81.8  PLT 135*  < > 147* 185 184  < > = values in this interval not displayed. Lipid Panel: No results for input(s): CHOL, HDL, LDLCALC, TRIG, CHOLHDL, LDLDIRECT in the last 8760 hours. No results found for: HGBA1C  Procedures  since last visit: No results found.   Assessment/Plan 1. Cellulitis of leg, left -I didn't see it so I don't know if this was legit -biggest problem I suspect is hygiene -needs 24 hr care, but refuses snf -reviewed [facility] notes indicating cluttered place and poor hygiene in restroom -needs to wash legs daily and apply mix of nystatin and triamcinolone (was too expensive when compounded)    2. Venous stasis dermatitis of both lower extremities -bathe legs daily and apply creams daily, elevate feet, ideally needs compression hose in daytime, but does not have regular help to put them on and take them off  3. Parkinson's disease ([place]) -cont sinemet and mirapex as directed from neurology -agree with PT, OT, ST at home   4. Chronic combined systolic and diastolic congestive heart failure ([place]) -concerned that lasix was reduced to 20mg  po daily from 40mg  po daily -she has chronic dyspnea on exertion and when she is speaking -advised to monitor weights at home, take diuretic as directed, notify me if weight going up or she is more short of breath or swollen  5. Anemia, iron deficiency -cont daily  iron supplement  6. Chronic constipation -cont 3 prunes per day, colace and miralax  Labs/tests ordered:  No orders of the defined types were placed in this encounter.    Next appt:  03/29/2016--keep as scheduled in hopes if I see her at least monthly we can keep her out of the hospital  Emmalea Treanor L. Keiana Tavella, D.O. [facility] [address], [place] [address] Cell Phone (Mon-Fri 8am-5pm):  [phone] On Call:  [phone] & follow prompts after 5pm & weekends Office Phone:  [phone] Office Fax:  [phone]

## 2016-03-21 NOTE — Patient Outreach (Signed)
[facility]) Care Management  03/21/2016  [name] [birth year] [id number]   Assessment- CSW received in basket message from PCP with question in regards to which company patient has [place] with because she encourages them to be focusing on her CHF as well as the cellulitis. CSW replied back with the company she uses. CSW also forwarded messages to [place] to ensure that she is updated with this concern as well. PCP plans to continue to see her monthly to keep patient out of the hospital.   Plan-CSW will continue to follow patient and work with her care providers.  [name], BSW, MSW, [email or web address] Phone: [phone] Fax: [phone]

## 2016-03-21 NOTE — Patient Instructions (Addendum)
Please wash your feet and legs each day and then apply the cream I gave you to keep the redness gone.  Continue with prunes (3 daily)  Complete antibiotics fully.  Monitor your weight daily.  If you are getting more short of breath or swollen or your weight goes up, please call so we can increase your diuretics again.

## 2016-03-22 DIAGNOSIS — G20 Parkinson's disease: Secondary | ICD-10-CM | POA: Diagnosis not present

## 2016-03-22 DIAGNOSIS — M6281 Muscle weakness (generalized): Secondary | ICD-10-CM | POA: Diagnosis not present

## 2016-03-22 DIAGNOSIS — L03115 Cellulitis of right lower limb: Secondary | ICD-10-CM | POA: Diagnosis not present

## 2016-03-22 DIAGNOSIS — F0280 Dementia in other diseases classified elsewhere without behavioral disturbance: Secondary | ICD-10-CM | POA: Diagnosis not present

## 2016-03-22 DIAGNOSIS — M545 Low back pain: Secondary | ICD-10-CM | POA: Diagnosis not present

## 2016-03-22 DIAGNOSIS — R441 Visual hallucinations: Secondary | ICD-10-CM | POA: Diagnosis not present

## 2016-03-25 DIAGNOSIS — F0280 Dementia in other diseases classified elsewhere without behavioral disturbance: Secondary | ICD-10-CM | POA: Diagnosis not present

## 2016-03-25 DIAGNOSIS — R441 Visual hallucinations: Secondary | ICD-10-CM | POA: Diagnosis not present

## 2016-03-25 DIAGNOSIS — M545 Low back pain: Secondary | ICD-10-CM | POA: Diagnosis not present

## 2016-03-25 DIAGNOSIS — G20 Parkinson's disease: Secondary | ICD-10-CM | POA: Diagnosis not present

## 2016-03-25 DIAGNOSIS — M6281 Muscle weakness (generalized): Secondary | ICD-10-CM | POA: Diagnosis not present

## 2016-03-25 DIAGNOSIS — L03115 Cellulitis of right lower limb: Secondary | ICD-10-CM | POA: Diagnosis not present

## 2016-03-27 ENCOUNTER — Other Ambulatory Visit: Payer: Self-pay | Admitting: *Deleted

## 2016-03-27 DIAGNOSIS — M545 Low back pain: Secondary | ICD-10-CM | POA: Diagnosis not present

## 2016-03-27 DIAGNOSIS — L03115 Cellulitis of right lower limb: Secondary | ICD-10-CM | POA: Diagnosis not present

## 2016-03-27 DIAGNOSIS — M6281 Muscle weakness (generalized): Secondary | ICD-10-CM | POA: Diagnosis not present

## 2016-03-27 DIAGNOSIS — R441 Visual hallucinations: Secondary | ICD-10-CM | POA: Diagnosis not present

## 2016-03-27 DIAGNOSIS — G20 Parkinson's disease: Secondary | ICD-10-CM | POA: Diagnosis not present

## 2016-03-27 DIAGNOSIS — F0280 Dementia in other diseases classified elsewhere without behavioral disturbance: Secondary | ICD-10-CM | POA: Diagnosis not present

## 2016-03-27 NOTE — Patient Outreach (Signed)
[facility]) Care Management [place] Telephone Outreach, Transition of care day 9 03/27/2016  [name] 08-Jun-1941 [id number]  Successful telephone outreach to [name], 75 y/o female, originally followed by [facility] on referral from [place] after ED visit 01/19/16 for safety concerns; prior to that, patient had been followed previously by [facility] telephonic health coach. Patient had recent IP hospital visit June 19-23, 2017, for bilateral LE edema and redness/ cellulitis and was treated with IV antibiotics. Recommendation was made at the time of IP hospital discharge for [name] placement, but patient refused. Patient is now followed by [facility] for transition of care after recent hospital admission.  Today, [name] reports that she is "doing okay," and states that she attended her PCP appointment, but had forgotten to take her BP medication bottle with her for her PCP to review, as she had planned; [name] stated she was going back to see her PCP "this Friday," and said that she would take the medication bottle with her then to ask Dr. Mariea Clonts about the need for her to take this medication.  [name] reported that she had been using the medication bin that we created during our home visit to keep all of her medications in one centralized place, which she reported was "very helpful."  [name] also reported that she has been using the pill cutter that was provided to her during last [place] in-home visit.   [name] confirmed that she has continued working with home health services [facility]) for RN and speech therapy, and in fact reported that both the nurse and speech therapist was currently at her home working with her at the time of our phone conversation.  [name] denied further questions, problems, issues, or concerns; we confirmed our next in-home visit, scheduled for next week.      Plan:   [name] will keep all of her medications in the  one centralized box, and will take her medications with her to her upcoming provider appointments by placing the plastic tub (box) in a plastic bag. [facility] referral placed.  [name] will ask her PCP about the need for her to take "BP and Heart medicine," as well as the Florastor which was prescribed at the time of hospital discharge, but she has not been taking.  [name] will take her ALL of her prescribed medications as they are prescribed and will keep her scheduled provider appointments.  [name] will continue actively working with [facility] services as ordered and with [facility] CSW.  [name] will notify her providers for any concerns, issues, problems, or questions that arise.  Continued [facility] outreach scheduled for next week with home visit.   [name] Rack, RN, BSN, [employer] [facility] Care Management  [phone]

## 2016-03-28 DIAGNOSIS — G20 Parkinson's disease: Secondary | ICD-10-CM | POA: Diagnosis not present

## 2016-03-28 DIAGNOSIS — M545 Low back pain: Secondary | ICD-10-CM | POA: Diagnosis not present

## 2016-03-28 DIAGNOSIS — M6281 Muscle weakness (generalized): Secondary | ICD-10-CM | POA: Diagnosis not present

## 2016-03-28 DIAGNOSIS — L03115 Cellulitis of right lower limb: Secondary | ICD-10-CM | POA: Diagnosis not present

## 2016-03-28 DIAGNOSIS — R441 Visual hallucinations: Secondary | ICD-10-CM | POA: Diagnosis not present

## 2016-03-28 DIAGNOSIS — F0280 Dementia in other diseases classified elsewhere without behavioral disturbance: Secondary | ICD-10-CM | POA: Diagnosis not present

## 2016-03-29 ENCOUNTER — Ambulatory Visit (INDEPENDENT_AMBULATORY_CARE_PROVIDER_SITE_OTHER): Payer: Medicare Other | Admitting: Internal Medicine

## 2016-03-29 ENCOUNTER — Encounter: Payer: Self-pay | Admitting: Internal Medicine

## 2016-03-29 VITALS — BP 130/80 | HR 74 | Temp 97.6°F | Ht 64.0 in | Wt 170.2 lb

## 2016-03-29 DIAGNOSIS — I5033 Acute on chronic diastolic (congestive) heart failure: Secondary | ICD-10-CM | POA: Diagnosis not present

## 2016-03-29 DIAGNOSIS — M545 Low back pain: Secondary | ICD-10-CM | POA: Diagnosis not present

## 2016-03-29 DIAGNOSIS — I8312 Varicose veins of left lower extremity with inflammation: Secondary | ICD-10-CM | POA: Diagnosis not present

## 2016-03-29 DIAGNOSIS — F0280 Dementia in other diseases classified elsewhere without behavioral disturbance: Secondary | ICD-10-CM | POA: Diagnosis not present

## 2016-03-29 DIAGNOSIS — I8311 Varicose veins of right lower extremity with inflammation: Secondary | ICD-10-CM | POA: Diagnosis not present

## 2016-03-29 DIAGNOSIS — L03115 Cellulitis of right lower limb: Secondary | ICD-10-CM | POA: Diagnosis not present

## 2016-03-29 DIAGNOSIS — G20 Parkinson's disease: Secondary | ICD-10-CM

## 2016-03-29 DIAGNOSIS — G20A1 Parkinson's disease without dyskinesia, without mention of fluctuations: Secondary | ICD-10-CM

## 2016-03-29 DIAGNOSIS — R441 Visual hallucinations: Secondary | ICD-10-CM | POA: Diagnosis not present

## 2016-03-29 DIAGNOSIS — I872 Venous insufficiency (chronic) (peripheral): Secondary | ICD-10-CM

## 2016-03-29 DIAGNOSIS — M6281 Muscle weakness (generalized): Secondary | ICD-10-CM | POA: Diagnosis not present

## 2016-03-29 MED ORDER — FUROSEMIDE 40 MG PO TABS: 40.0000 mg | ORAL_TABLET | Freq: Every day | ORAL | Status: DC

## 2016-03-29 NOTE — Progress Notes (Signed)
Location:  [facility] clinic Provider:  Kaedynce Tapp L. Mariea Clonts, D.O., C.M.D.  Code Status: DNR Goals of Care:  Advanced Directives 03/29/2016  Does patient have an advance directive? Yes  Type of Advance Directive -  Copy of advanced directive(s) in chart? Yes  Pre-existing out of facility DNR order (yellow form or pink MOST form) Pink MOST form placed in chart (order not valid for inpatient use)    Chief Complaint  Patient presents with  . Medical Management of Chronic Issues    3 month follow up for medical management    [name] seen today for medical management of chronic diseases.    She has chronic dyspnea on exertion.  Reviewed that she is not to take the old cardiac med she still had on hand ONLY what is on the med list from [place], plus the creams for her legs that mysteriously got removed from the list.    She continues with swelling and chronic erythema of bilateral lower legs which burn.  She is supposedly bathing daily and applying the creams as ordered to help prevent infection.  Hygiene always appears poor for her and her brother.    She did not c/o back pain today.    She is getting ST which she feels is helping her speech and I had less difficulty understanding her today.    Tells me she does have someone coming in to clean her house twice a week now.  Past Medical History  Diagnosis Date  . Parkinson's disease   . Hypertension   . Arthritis   . Anemia   . Cellulitis of lower leg 12/24/2011  . Chronic systolic heart failure ([place])   . Acute on chronic systolic heart failure ([place])   . Cellulitis and abscess of leg, except foot   . Hypotension, unspecified   . Helicobacter pylori (H. pylori)   . CHF (congestive heart failure) ([place])   . Anxiety   . Cataract left  . Glaucoma left  . GERD (gastroesophageal reflux disease)   . Hypercholesterolemia   . Chronic bronchitis ([place])     "usually get it q yr"  . Sleep apnea     "had test years ago; insurance  wouldn't pay for mask so I never had one" (04/27/2015)  . Hypothyroidism   . History of blood transfusion     "don't remember why"  . History of bleeding peptic ulcer   . Stroke [facility] X 3    "that  was the reason I got Parkinson's"    Past Surgical History  Procedure Laterality Date  . Hemiarthroplasty hip Right 09/24/2007    Archie Endo 01/23/2011  . Thyroidectomy    . Esophagogastroduodenoscopy  09/26/2011    Procedure: ESOPHAGOGASTRODUODENOSCOPY (EGD);  Surgeon: Zenovia Jarred, MD;  Location[facility] ENDOSCOPY;  Service: Gastroenterology;  Laterality: N/A;  To be done at bedside.  . Esophagogastroduodenoscopy N/A 09/30/2013    Procedure: ESOPHAGOGASTRODUODENOSCOPY (EGD);  Surgeon: [name], MD;  Location: [place];  Service: Endoscopy;  Laterality: N/A;  . Esophagogastroduodenoscopy N/A 11/25/2013    Procedure: ESOPHAGOGASTRODUODENOSCOPY (EGD);  Surgeon: [name], MD;  Location: [facility];  Service: Endoscopy;  Laterality: N/A;  . Tonsillectomy    . Nissen fundoplication      "had OR for GERD"  . Tendon repair      Gluteus medius Archie Endo 01/23/2011  . Back surgery    . Kyphoplasty  06/2009    T12/notes 07/17/2009    Allergies  Allergen Reactions  . Codeine Other (See Comments)    Unknown allergic reaction  . Penicillins Other (See Comments)    Pt passed out in doctor's office after penicillin dose      Medication List       This list is accurate as of: 03/29/16 11:38 AM.  Always use your most recent med list.               carbidopa-levodopa 25-100 MG tablet  Commonly known as:  SINEMET IR  Take 1 at 6a, 1 1/2 pills at [place], 1 at noon, 1 1/2 pills at 3p, 1 at 6pm, 1 1/2 pills at 9pm     clindamycin 300 MG capsule  Commonly known as:  CLEOCIN  Take 1 capsule (300 mg total) by mouth 3 (three) times daily.     docusate sodium 100 MG capsule  Commonly known as:  COLACE  Take 100 mg by mouth daily. Reported on 02/28/2016     ferrous sulfate 325 (65 FE) MG EC tablet  Take  1 tablet (325 mg total) by mouth daily.     furosemide 20 MG tablet  Commonly known as:  LASIX  Take 20 mg by mouth daily.     latanoprost 0.005 % ophthalmic solution  Commonly known as:  XALATAN  Place 1 drop into the left eye daily at 6 PM.     LORazepam 0.5 MG tablet  Commonly known as:  ATIVAN  Take 1 tablet (0.5 mg total) by mouth 2 (two) times daily as needed for anxiety.     Melatonin 5 MG Tabs  Take one tablet by mouth one hour before bedtime for sleep     multivitamin with minerals tablet  Take 1 tablet by mouth daily.     oxyCODONE 5 MG immediate release tablet  Commonly known as:  Oxy IR/ROXICODONE  Take 1 tablet (5 mg total) by mouth 2 (two) times daily as needed for severe pain.     pramipexole 1 MG tablet  Commonly known as:  MIRAPEX  Take 1 tablet (1 mg total) by mouth 3 (three) times daily.     SYSTANE OP  Place 1 drop into the right eye daily at 6 PM.     Vitamin D 2000 units Caps  Take by mouth daily.        Review of Systems:  Review of Systems  Constitutional: Positive for malaise/fatigue. Negative for fever and chills.  Eyes: Negative for blurred vision.  Respiratory: Positive for shortness of breath. Negative for cough, sputum production and wheezing.   Cardiovascular: Positive for leg swelling. Negative for chest pain and palpitations.  Gastrointestinal: Positive for constipation. Negative for abdominal pain, diarrhea, blood in stool and melena.  Genitourinary: Negative for dysuria.  Musculoskeletal: Negative for falls.  Skin:       Chronic erythema of bilateral LEs with venous insufficiency (but won't use compression hose and only elevates feet minimally)  Neurological: Positive for tremors and weakness. Negative for dizziness, loss of consciousness and headaches.       Dyskinesias  Endo/Heme/Allergies: Bruises/bleeds easily.  Psychiatric/Behavioral: Positive for depression and memory loss.       Sometimes confused now and had been  hallucinating--no longer has a caregiver to tell me if she is    Health Maintenance  Topic Date Due  . TETANUS/TDAP  06/07/1959  . COLONOSCOPY  06/06/1990  . ZOSTAVAX  06/06/2000  . DEXA SCAN  06/06/2005  . MAMMOGRAM  11/22/2011  . INFLUENZA  VACCINE  04/23/2016  . PNA vac Low Risk Adult  Completed    Physical Exam: Filed Vitals:   03/29/16 1053  BP: 130/80  Pulse: 74  Temp: 97.6 F (36.4 C)  TempSrc: Oral  Height: 5\' 4"  (1.626 m)  Weight: 170 lb 3.2 oz (77.202 kg)   Body mass index is 29.2 kg/(m^2). Physical Exam  Constitutional: She is oriented to person, place, and time. No distress.  Disheveled appearance with dirt on shoes and slight odor  Cardiovascular: Normal rate, regular rhythm and normal heart sounds.   Pulmonary/Chest: She has rales.  Dyspneic on exertion coming back for visit and getting weighed; crackles at bases bilaterally, slight increase in edema of bilateral legs with left still larger than right (previously had negative doppler for dvt)  Musculoskeletal:  Stooped posture with kyphoscoliosis, walks with rollator walker  Neurological: She is alert and oriented to person, place, and time. She exhibits abnormal muscle tone.  Resting tremor, unsteady gait, leans right, has dyskinesias  Skin: Skin is warm and dry. There is erythema.  Psychiatric: She has a normal mood and affect.    Labs reviewed: Basic Metabolic Panel:  Recent Labs  04/23/15 2025  04/27/15 2112  01/20/16 0036 02/15/16 1506 03/11/16 1522  NA  --   < >  --   < > 145 142 140  K  --   < >  --   < > 4.6 4.6 4.1  CL  --   < >  --   < > 114* 104 110  CO2  --   < >  --   < > 22 19 22   GLUCOSE  --   < >  --   < > 103* 91 115*  BUN  --   < >  --   < > 19 21 16   CREATININE 0.98  < >  --   < > 0.86 0.89 0.82  CALCIUM  --   < >  --   < > 8.6* 9.0 8.8*  MG 1.9  --  2.1  --   --   --   --   PHOS 3.7  --   --   --   --   --   --   TSH  --   --  2.277  --   --   --   --   < > = values in this  interval not displayed. Liver Function Tests:  Recent Labs  04/03/15 1350 04/24/15 0609 04/27/15 1410  AST 18 30 24   ALT 7 6* 24  ALKPHOS 66 52 67  BILITOT 0.4 1.0 0.8  PROT 5.8* 5.3* 6.2*  ALBUMIN 3.8 2.9* 3.4*   No results for input(s): LIPASE, AMYLASE in the last 8760 hours. No results for input(s): AMMONIA in the last 8760 hours. CBC:  Recent Labs  04/28/15 0534  01/20/16 0036 02/15/16 1506 03/11/16 1522  WBC 7.8  < > 8.1 7.8 7.5  NEUTROABS  --   < > 6.0 5.8 5.3  HGB 13.0  --  10.2*  --  10.1*  HCT 40.0  < > 33.3* 34.4 33.8*  MCV 86.2  < > 80.8 79 81.8  PLT 135*  < > 147* 185 184  < > = values in this interval not displayed. Lipid Panel: No results for input(s): CHOL, HDL, LDLCALC, TRIG, CHOLHDL, LDLDIRECT in the last 8760 hours. No results found for: HGBA1C  Procedures since last visit: Dg Chest [facility]  03/14/2016  CLINICAL DATA:  Wheezing today. EXAM: PORTABLE CHEST 1 VIEW COMPARISON:  PA and lateral chest 08/30/2015 and 04/27/2015. FINDINGS: There is mild cardiomegaly. Small focus of atelectasis or scar in the lingula is noted. Hiatal hernia is identified. No pneumothorax or pleural effusion. IMPRESSION: Cardiomegaly without acute disease. Hiatal hernia. Electronically Signed   By: Inge Rise M.D.   On: 03/14/2016 11:24    Assessment/Plan 1. Acute on chronic diastolic CHF (congestive heart failure), NYHA class 3 (HCC) - increase lasix from 20mg  to 40mg  daily - furosemide (LASIX) 40 MG tablet; Take 1 tablet (40 mg total) by mouth daily.  Dispense: 30 tablet; Refill: 5 -f/u in 8 wks -cont help from [facility] with meds and being sure she has as many social supports as possible when she refuses nursing home care and cannot afford to pay for private caregivers  2. Venous stasis dermatitis of both lower extremities -refuses to use compression hose -elevate feet at rest -cont to bathe legs DAILY and apply two creams I prescribed 2 visits ago DAILY  3.  Parkinson's disease ([place]) -gradually progressing with more cognitive problems and some now notable short term memory loss and reported hallucinations  Labs/tests ordered:  No orders of the defined types were placed in this encounter.   Next appt:  05/24/2016 med mgt   Nola Botkins L. Allexa Acoff, D.O. [facility] [address], [place] [address] Cell Phone (Mon-Fri 8am-5pm):  [phone] On Call:  [phone] & follow prompts after 5pm & weekends Office Phone:  [phone] Office Fax:  [phone]

## 2016-03-29 NOTE — Patient Instructions (Signed)
Increase your lasix to 40mg  daily due to your increase in weight and shortness of breath (I heard fluid at your lung bases).    Continue to use the mixture of the two creams for your legs after washing them with soap and water each day. Elevate your feet at rest.

## 2016-04-01 DIAGNOSIS — G20 Parkinson's disease: Secondary | ICD-10-CM | POA: Diagnosis not present

## 2016-04-01 DIAGNOSIS — I5032 Chronic diastolic (congestive) heart failure: Secondary | ICD-10-CM | POA: Diagnosis not present

## 2016-04-01 DIAGNOSIS — I110 Hypertensive heart disease with heart failure: Secondary | ICD-10-CM | POA: Diagnosis not present

## 2016-04-01 DIAGNOSIS — R490 Dysphonia: Secondary | ICD-10-CM | POA: Diagnosis not present

## 2016-04-01 DIAGNOSIS — J42 Unspecified chronic bronchitis: Secondary | ICD-10-CM | POA: Diagnosis not present

## 2016-04-01 DIAGNOSIS — Z9181 History of falling: Secondary | ICD-10-CM | POA: Diagnosis not present

## 2016-04-01 DIAGNOSIS — R441 Visual hallucinations: Secondary | ICD-10-CM | POA: Diagnosis not present

## 2016-04-01 DIAGNOSIS — I872 Venous insufficiency (chronic) (peripheral): Secondary | ICD-10-CM | POA: Diagnosis not present

## 2016-04-01 DIAGNOSIS — M545 Low back pain: Secondary | ICD-10-CM | POA: Diagnosis not present

## 2016-04-01 DIAGNOSIS — F0281 Dementia in other diseases classified elsewhere with behavioral disturbance: Secondary | ICD-10-CM | POA: Diagnosis not present

## 2016-04-01 DIAGNOSIS — M6281 Muscle weakness (generalized): Secondary | ICD-10-CM | POA: Diagnosis not present

## 2016-04-02 ENCOUNTER — Other Ambulatory Visit: Payer: Self-pay | Admitting: *Deleted

## 2016-04-02 DIAGNOSIS — R490 Dysphonia: Secondary | ICD-10-CM | POA: Diagnosis not present

## 2016-04-02 DIAGNOSIS — I110 Hypertensive heart disease with heart failure: Secondary | ICD-10-CM | POA: Diagnosis not present

## 2016-04-02 DIAGNOSIS — I5032 Chronic diastolic (congestive) heart failure: Secondary | ICD-10-CM | POA: Diagnosis not present

## 2016-04-02 DIAGNOSIS — G20 Parkinson's disease: Secondary | ICD-10-CM | POA: Diagnosis not present

## 2016-04-02 DIAGNOSIS — F0281 Dementia in other diseases classified elsewhere with behavioral disturbance: Secondary | ICD-10-CM | POA: Diagnosis not present

## 2016-04-02 DIAGNOSIS — I872 Venous insufficiency (chronic) (peripheral): Secondary | ICD-10-CM | POA: Diagnosis not present

## 2016-04-02 NOTE — Patient Outreach (Signed)
[facility]) Care Management  [facility] CM Routine Home Visit, Transition of Care day 15 04/02/2016  [name] 1941/04/27 [id number]  [name] is an 75 y.o. female previously followed by [facility] on referral from [place] after ED visit 01/19/16 for safety concerns; prior to that, patient had been followed previously by [facility] telephonic CM.  Unfortunately, [name] was admitted to the hospital June 19-23, 2017 for bilateral LE edema and redness (cellulitis), where she was treated with IV antibiotics.  Recommendation was made for patient to be discharged to [name], but patient refused [name] placement.  [name] is now followed by [facility] for transition of care after recent IP hospitalization.  [facility] CSW is also actively involved in [name]'s care for community resource evaluation.   Today, [name] continues to report that she is "doing better" since her discharge home from the hospital.  Home health [facility]) services are currently in place for [name] and the [facility] nurse was present today during [name] home visit. [place] services for nursing, OT, PT, and speech therapy are currently in place, and patient reports active participation with these disciplines.  During last [facility] home visit on March 19, 2016, the following safety issues were identified:  1) Fall Risk/ Living Environment:    Cluttered living environment is cluttered has improved since last [facility] home visit.  Patient reports today that she has arranged for house cleaning assistance twice a week, and reports that this service "starts today."  There are no visible medication bottles scattered over the room as there was with initial [facility] home visit; patient reports that she has kept all of her medications in one central location in the plastic bin we created during last visit, and from visualization, this appears to be accurate.   Ms. Werkheiser continues to use a walker 100% of the time, and she reports no falls  since last [place] home visit.  2) Medications: safety, compliance, administration, adherence:  Safety: As noted above, patient's no longer has medication bottles scattered throughout her main living area, and all current medications appear to be placed in the plastic bin we created for her to keep her medications together during last [facility] home visit.  Today, I discovered 2 new bottles of expired medications which had been placed in the medication bin:  (1) Lasix 20 mg which appeared to be half-full, expired in 2013, and (1) Lasix 40 mg, which had approximately 12 pills remaining in bottle, expired in 2014.  Those 2 medications were removed from the home and taken to [facility] for disposal.  [name] had her current dose of Lasix 40 mg placed in the medication bin.   Compliance: [name] and I again reviewed her medications and she reports today that she is taking all medications as prescribed.  Administration/ Adherence: Patient reports today that she "lost" the small pill box that she had been previously filling for herself each morning, for the entire day's medications.  [name] reports that she has been taking her medications directly from the medication bin, and reports that this is "easy now that they are all together." [name] has tremors from Parkinson's disease, and has difficulty at times opening her pill bottles; we again replaced several twist-off lids with "pull off" lids, which she is easier for her to manage.    [name] takes carbidopal/ levodopa (sinemet) 6 x day, and her dosing requires that she  cut the some of the pills in half.  [name] reports using the pill cutter provided to her during last [facility] home visit, again verbalizing that she finds the pill cutter "much easier than using scissors" to cut her pills.  [name] stated that she "forgot" to take the expired BP medication bottle from last [facility] home visit to her recent visit with Dr. Mariea Clonts on March 29, 2016; however, upon review of Dr. Cyndi Lennert notes from that visit, this issue was addressed by Dr. Mariea Clonts, and I shared with [name] that Dr. Mariea Clonts had reviewed her medications and that patient was on correct medications.    Patient reported using creams as ordered by Dr. Mariea Clonts on her lower extremities, and today, her bilateral lower legs look better, with less redness and no oozing.   [name] was able to accurately share her scheduled upcoming provider appointments when I compared to her schedule in the EMR, and voiced plans to attend these appointments.  [name] also reports a scheduled appointment with eye specialist, Dr. Tommy Rainwater "at the end of the month," for cataract evaluation.  Subjective: "I'm doing much better.  I have kept all of my medications in the bin just like you asked me to, and it is helping me."  Objective:    BP 110/60 mmHg  Pulse 82  Resp 16  Wt 170 lb (77.111 kg)  SpO2 97%   Review of Systems  Constitutional: Negative for fever.  Respiratory: Positive for cough, sputum production and shortness of breath. Negative for wheezing.        Patient reports occasional cough, productive of "white" sputum; not coughing at present  Cardiovascular: Positive for leg swelling.       Bilateral lower edema ecchymosis with swelling but no oozing; (R) leg with +1 edema and (L) leg with +2-3 edema. Bilateral ankles not significantly swollen; +1 DP's bilaterally.  Legs less ecchymotic from last [place] home visit  Gastrointestinal: Negative for nausea and abdominal pain.  Genitourinary: Positive for frequency.       Patient attributes to increased Lasix dosage  Musculoskeletal: Negative for falls.  Neurological: Positive for tremors.       Hx Parkinsons disease  Psychiatric/Behavioral: Negative for depression. The patient is not nervous/anxious.     Physical Exam  Constitutional: She is oriented to person, place, and time. She appears well-developed and well-nourished. No  distress.  Cardiovascular: Normal rate, regular rhythm, normal heart sounds and intact distal pulses.   Pulses:      Radial pulses are 2+ on the right side, and 2+ on the left side.       Dorsalis pedis pulses are 1+ on the right side, and 1+ on the left side.  Respiratory: Effort normal and breath sounds normal. No respiratory distress. She has no wheezes. She has no rales.  Patient is SOB with activity, recovers quickly when resting  GI: Soft. Bowel sounds are normal.  Musculoskeletal: She exhibits edema.  See ROS for bilateral lower extremity edema  Neurological: She is alert and oriented to person, place, and time.  Skin: Skin is warm and dry. There is erythema.  See ROS for bilateral leg ecchymosis  Psychiatric: She has a normal mood and affect. Her behavior is normal. Judgment and thought content normal.    Encounter Medications:   Outpatient Encounter Prescriptions as of 04/02/2016  Medication Sig  . carbidopa-levodopa (SINEMET IR) 25-100 MG tablet Take 1 at 6a, 1 1/2 pills at [place], 1  at noon, 1 1/2 pills at 3p, 1 at 6pm, 1 1/2 pills at 9pm  . Cholecalciferol (VITAMIN D) 2000 units CAPS Take by mouth daily.  . clindamycin (CLEOCIN) 300 MG capsule Take 1 capsule (300 mg total) by mouth 3 (three) times daily.  Marland Kitchen docusate sodium (COLACE) 100 MG capsule Take 100 mg by mouth daily. Reported on 02/28/2016  . ferrous sulfate 325 (65 FE) MG EC tablet Take 1 tablet (325 mg total) by mouth daily.  . furosemide (LASIX) 40 MG tablet Take 1 tablet (40 mg total) by mouth daily.  Marland Kitchen latanoprost (XALATAN) 0.005 % ophthalmic solution Place 1 drop into the left eye daily at 6 PM.   . LORazepam (ATIVAN) 0.5 MG tablet Take 1 tablet (0.5 mg total) by mouth 2 (two) times daily as needed for anxiety.  . Melatonin 5 MG TABS Take one tablet by mouth one hour before bedtime for sleep  . Multiple Vitamins-Minerals (MULTIVITAMIN WITH MINERALS) tablet Take 1 tablet by mouth daily.  Marland Kitchen oxyCODONE (OXY IR/ROXICODONE) 5  MG immediate release tablet Take 1 tablet (5 mg total) by mouth 2 (two) times daily as needed for severe pain.  Vladimir Faster Glycol-Propyl Glycol (SYSTANE OP) Place 1 drop into the right eye daily at 6 PM.  . pramipexole (MIRAPEX) 1 MG tablet Take 1 tablet (1 mg total) by mouth 3 (three) times daily.   No facility-administered encounter medications on file as of 04/02/2016.    Functional Status:   In your present state of health, do you have any difficulty performing the following activities: 03/11/2016 02/16/2016  Hearing? N -  Vision? N -  Difficulty concentrating or making decisions? N -  Walking or climbing stairs? Y -  Dressing or bathing? Y -  Doing errands, shopping? Y -  Conservation officer, nature and eating ? - Y  Using the Toilet? - N  In the past six months, have you accidently leaked urine? - N  Do you have problems with loss of bowel control? - N  Managing your Medications? - N  Managing your Finances? - N  Housekeeping or managing your Housekeeping? - N    Fall/Depression Screening:    PHQ 2/9 Scores 03/29/2016 03/19/2016 02/21/2016 01/30/2016 01/26/2016 01/16/2016 12/29/2015  PHQ - 2 Score 0 0 0 0 0 0 0    Assessment:  Safety issues that were identified during last [place] in-home visit appear to have improved, and Ms. Pietsch appears to feel better.  Ms. Terres appears to have been using her medication bin to keep all of medications together in one central location.  [name] has taken steps to improve the safety issues which were previously identified.  Plan:   Ms. Inwood will continue keeping all of her medications in the one centralized box, and will take her medications as prescribed. [place] referral placed during last [place] home visit.  Ms. Mercuri will attend her scheduled provider appointments.  Ms. Menasco will continue actively working with [facility] services as ordered, and with [facility] CSW.  Ms. Kable will notify her providers for any concerns, issues, problems, or  questions that arise.  Continued [facility] CM telephone outreach next week, with home visit scheduled in 2 weeks.  [name], RN, BSN, [employer] [facility] Care Management  [phone]

## 2016-04-03 DIAGNOSIS — G20 Parkinson's disease: Secondary | ICD-10-CM | POA: Diagnosis not present

## 2016-04-03 DIAGNOSIS — F0281 Dementia in other diseases classified elsewhere with behavioral disturbance: Secondary | ICD-10-CM | POA: Diagnosis not present

## 2016-04-03 DIAGNOSIS — I5032 Chronic diastolic (congestive) heart failure: Secondary | ICD-10-CM | POA: Diagnosis not present

## 2016-04-03 DIAGNOSIS — I872 Venous insufficiency (chronic) (peripheral): Secondary | ICD-10-CM | POA: Diagnosis not present

## 2016-04-03 DIAGNOSIS — R490 Dysphonia: Secondary | ICD-10-CM | POA: Diagnosis not present

## 2016-04-03 DIAGNOSIS — I110 Hypertensive heart disease with heart failure: Secondary | ICD-10-CM | POA: Diagnosis not present

## 2016-04-04 DIAGNOSIS — G20 Parkinson's disease: Secondary | ICD-10-CM | POA: Diagnosis not present

## 2016-04-04 DIAGNOSIS — R490 Dysphonia: Secondary | ICD-10-CM | POA: Diagnosis not present

## 2016-04-04 DIAGNOSIS — I5032 Chronic diastolic (congestive) heart failure: Secondary | ICD-10-CM | POA: Diagnosis not present

## 2016-04-04 DIAGNOSIS — F0281 Dementia in other diseases classified elsewhere with behavioral disturbance: Secondary | ICD-10-CM | POA: Diagnosis not present

## 2016-04-04 DIAGNOSIS — I872 Venous insufficiency (chronic) (peripheral): Secondary | ICD-10-CM | POA: Diagnosis not present

## 2016-04-04 DIAGNOSIS — I110 Hypertensive heart disease with heart failure: Secondary | ICD-10-CM | POA: Diagnosis not present

## 2016-04-05 DIAGNOSIS — G20 Parkinson's disease: Secondary | ICD-10-CM | POA: Diagnosis not present

## 2016-04-05 DIAGNOSIS — I872 Venous insufficiency (chronic) (peripheral): Secondary | ICD-10-CM | POA: Diagnosis not present

## 2016-04-05 DIAGNOSIS — I5032 Chronic diastolic (congestive) heart failure: Secondary | ICD-10-CM | POA: Diagnosis not present

## 2016-04-05 DIAGNOSIS — I110 Hypertensive heart disease with heart failure: Secondary | ICD-10-CM | POA: Diagnosis not present

## 2016-04-05 DIAGNOSIS — R490 Dysphonia: Secondary | ICD-10-CM | POA: Diagnosis not present

## 2016-04-05 DIAGNOSIS — F0281 Dementia in other diseases classified elsewhere with behavioral disturbance: Secondary | ICD-10-CM | POA: Diagnosis not present

## 2016-04-08 ENCOUNTER — Other Ambulatory Visit: Payer: Self-pay | Admitting: Pharmacist

## 2016-04-08 DIAGNOSIS — I110 Hypertensive heart disease with heart failure: Secondary | ICD-10-CM | POA: Diagnosis not present

## 2016-04-08 DIAGNOSIS — G20 Parkinson's disease: Secondary | ICD-10-CM | POA: Diagnosis not present

## 2016-04-08 DIAGNOSIS — R490 Dysphonia: Secondary | ICD-10-CM | POA: Diagnosis not present

## 2016-04-08 DIAGNOSIS — I872 Venous insufficiency (chronic) (peripheral): Secondary | ICD-10-CM | POA: Diagnosis not present

## 2016-04-08 DIAGNOSIS — I5032 Chronic diastolic (congestive) heart failure: Secondary | ICD-10-CM | POA: Diagnosis not present

## 2016-04-08 DIAGNOSIS — F0281 Dementia in other diseases classified elsewhere with behavioral disturbance: Secondary | ICD-10-CM | POA: Diagnosis not present

## 2016-04-08 NOTE — Patient Outreach (Signed)
[facility]) Care Management  04/08/2016  [name] October 20, 1940 [id number]  Patient was referred to [place] by Willeen Niece, [place] Coordinator for medication review.  Placed phone call to patient, who verified her HIPAA details.  Explained purpose of call and patient stated that she prefers a home visit to review medications.   Plan:  Initial home visit scheduled with patient for medication review.    Karrie Meres, PharmD, [place] [phone]

## 2016-04-09 ENCOUNTER — Other Ambulatory Visit: Payer: Self-pay | Admitting: Licensed Clinical Social Worker

## 2016-04-09 ENCOUNTER — Other Ambulatory Visit: Payer: Self-pay | Admitting: Pharmacist

## 2016-04-09 DIAGNOSIS — I110 Hypertensive heart disease with heart failure: Secondary | ICD-10-CM | POA: Diagnosis not present

## 2016-04-09 DIAGNOSIS — I5032 Chronic diastolic (congestive) heart failure: Secondary | ICD-10-CM | POA: Diagnosis not present

## 2016-04-09 DIAGNOSIS — G20 Parkinson's disease: Secondary | ICD-10-CM | POA: Diagnosis not present

## 2016-04-09 DIAGNOSIS — I872 Venous insufficiency (chronic) (peripheral): Secondary | ICD-10-CM | POA: Diagnosis not present

## 2016-04-09 DIAGNOSIS — F0281 Dementia in other diseases classified elsewhere with behavioral disturbance: Secondary | ICD-10-CM | POA: Diagnosis not present

## 2016-04-09 DIAGNOSIS — R490 Dysphonia: Secondary | ICD-10-CM | POA: Diagnosis not present

## 2016-04-09 NOTE — Patient Outreach (Signed)
[facility]) Care Management  04/09/2016  DERIANA GOHLKE March 25, 1941 [id number]   Assessment- CSW completed outreach to patient and patient answered. Patient reports that she is feeling "fine." She states that she has had no recent falls. CSW questioned if she had been able to leave the house and be in the community like going to church or being around family and she declined. She shares that she has been taking her medications as prescribed. CSW questioned if she remembers taking to our [facility] Pharmacist and she reports that he is there now completing initial home visit. CSW will speak to patient at a different time so that [place] can complete visit.  Plan-CSW will follow up with patient within one week.  [name], BSW, MSW, [email or web address] Phone: [phone] Fax: [phone]

## 2016-04-10 ENCOUNTER — Other Ambulatory Visit: Payer: Self-pay | Admitting: *Deleted

## 2016-04-10 NOTE — Patient Outreach (Signed)
Transition of care call successful - most recent in patient stay 6/19-6/23 for Bilateral lower extremity edema and cellulitis.  Spoke with pt, HIPAA verified.  Informed pt this RN CM covering for [employer] (pt's primary nurse case manager).   Pt reports doing good, has a little swelling in legs, notices a little redness in legs at night, no pain.  Pt reports still receiving home health services (RN, OT, PT, speech).  Pt reports not weighing on her own, [facility] RN has been weighing her, weights  the same.   Pt reports no recent falls, taking all of her medications.  RN CM discussed with pt [name] - her primary nurse case manager scheduled to do a home visit 7/24.      Plan to provide update to [place] upon her return.    [name].   [place] Care Management  [phone]

## 2016-04-10 NOTE — Patient Outreach (Addendum)
[facility]) Care Management  [place]   04/10/2016  [name] [birth year] [id number]  Subjective:  [name] is a 75 year old female who was referred to [place] by Richarda Osmond, [place] Coordinator for medication review given concerns that patient had medications she wasn't supposed to be taking and adherence concerns.    Home visit was completed with patient on 04/09/16.  Patient had her medications in a bucket that she says she takes them from.  She reports she has a small pill box that she places her parkinson's medications in.    Patient reports that all of the medications that she takes on a daily basis were in the bucket she showed [facility] Pharmacist.  When asked if she had other medications that she took elsewhere, she said no.   Patient reports that at this time she is not interested using a 7 day pill planner or looking into a pharmacy that does bubble packaging to improve adherence.   Patient reports that she has nystatin and triamcinolone creams in her possession that Dr Mariea Clonts prescribed, these had a last fill date of 03/21/16.    She denies concerns with the cost of her medications.   Patient was counseled on the importance of taking her medications as presribed by her prescribers.    Objective:   Encounter Medications: Outpatient Encounter Prescriptions as of 04/09/2016  Medication Sig Note  . carbidopa-levodopa (SINEMET IR) 25-100 MG tablet Take 1 at 6a, 1 1/2 pills at 9a, 1 at noon, 1 1/2 pills at 3p, 1 at 6pm, 1 1/2 pills at 9pm   . docusate sodium (COLACE) 100 MG capsule Take 100 mg by mouth daily. Reported on 02/28/2016   . ferrous sulfate 325 (65 FE) MG EC tablet Take 1 tablet (325 mg total) by mouth daily.   . furosemide (LASIX) 40 MG tablet Take 1 tablet (40 mg total) by mouth daily.   Marland Kitchen latanoprost (XALATAN) 0.005 % ophthalmic solution Place 1 drop into the left eye daily at 6 PM.    . LORazepam (ATIVAN) 0.5 MG tablet Take 1 tablet  (0.5 mg total) by mouth 2 (two) times daily as needed for anxiety.   . Melatonin 5 MG TABS Take one tablet by mouth one hour before bedtime for sleep   . Multiple Vitamins-Minerals (MULTIVITAMIN WITH MINERALS) tablet Take 1 tablet by mouth daily.   Marland Kitchen oxyCODONE (OXY IR/ROXICODONE) 5 MG immediate release tablet Take 1 tablet (5 mg total) by mouth 2 (two) times daily as needed for severe pain. 04/10/2016: States she has not used in the past 7 days.    . pramipexole (MIRAPEX) 1 MG tablet Take 1 tablet (1 mg total) by mouth 3 (three) times daily.   . Cholecalciferol (VITAMIN D) 2000 units CAPS Take by mouth daily. Reported on 04/10/2016   . clindamycin (CLEOCIN) 300 MG capsule Take 1 capsule (300 mg total) by mouth 3 (three) times daily. (Patient not taking: Reported on 04/02/2016) 04/02/2016: Patient reports completed doses; verified empty bottle  . Polyethyl Glycol-Propyl Glycol (SYSTANE OP) Place 1 drop into the right eye daily at 6 PM.    No facility-administered encounter medications on file as of 04/09/2016.    Functional Status: In your present state of health, do you have any difficulty performing the following activities: 03/11/2016 02/16/2016  Hearing? N -  Vision? N -  Difficulty concentrating or making decisions? N -  Walking or climbing stairs? Y -  Dressing or bathing? Y -  Doing errands, shopping? Y -  Conservation officer, nature and eating ? - Y  Using the Toilet? - N  In the past six months, have you accidently leaked urine? - N  Do you have problems with loss of bowel control? - N  Managing your Medications? - N  Managing your Finances? - N  Housekeeping or managing your Housekeeping? - N    Fall/Depression Screening: PHQ 2/9 Scores 03/29/2016 03/19/2016 02/21/2016 01/30/2016 01/26/2016 01/16/2016 12/29/2015  PHQ - 2 Score 0 0 0 0 0 0 0    Assessment:  Patient had her medications, based on the medications she showed [name] Pharmacist and they matched her medication list in this chart.   Medications  were reviewed by comparing bottles she showed [facility] Pharmacist and the medication list in this chart.    Patient stated she is not interested in a pill planner for her daily medications or bubble packaging of her medications.    Drugs sorted by system:  Neurologic/Psychologic: -carbidopa/levodopa -lorazepam -pramipexole  Cardiovascular: -furosemide  Gastrointestinal: -docusate   Pain: -oxycodone  Vitamins/Minerals: -vitamin D (patient reports she is out)  -ferrous sulfate -multivitamin  Miscellaneous: -latanoprost eye drops -melatonin  Topical: -nystatin cream -triamcinolone cream  Medications to avoid in the elderly: lorazepam---increased risk of falls   Other issues noted: patient reports she is unsure if she should be taking vitamin D and what dose    Plan:  Per patient request, will send message to PCP to clarify her vitamin D dose if she is to be on this as patient is unsure of dose and reports being out.    Will route note to patient's PCP and send barriers letter to PCP.   Will place follow-up call to patient in about 2 weeks or sooner if MD replies to clarify vitamin D.    Karrie Meres, PharmD, [place] [phone]

## 2016-04-11 ENCOUNTER — Encounter: Payer: Self-pay | Admitting: Pharmacist

## 2016-04-11 DIAGNOSIS — I110 Hypertensive heart disease with heart failure: Secondary | ICD-10-CM | POA: Diagnosis not present

## 2016-04-11 DIAGNOSIS — F0281 Dementia in other diseases classified elsewhere with behavioral disturbance: Secondary | ICD-10-CM | POA: Diagnosis not present

## 2016-04-11 DIAGNOSIS — I872 Venous insufficiency (chronic) (peripheral): Secondary | ICD-10-CM | POA: Diagnosis not present

## 2016-04-11 DIAGNOSIS — R490 Dysphonia: Secondary | ICD-10-CM | POA: Diagnosis not present

## 2016-04-11 DIAGNOSIS — G20 Parkinson's disease: Secondary | ICD-10-CM | POA: Diagnosis not present

## 2016-04-11 DIAGNOSIS — I5032 Chronic diastolic (congestive) heart failure: Secondary | ICD-10-CM | POA: Diagnosis not present

## 2016-04-12 ENCOUNTER — Telehealth: Payer: Self-pay | Admitting: *Deleted

## 2016-04-12 DIAGNOSIS — I110 Hypertensive heart disease with heart failure: Secondary | ICD-10-CM | POA: Diagnosis not present

## 2016-04-12 DIAGNOSIS — I5032 Chronic diastolic (congestive) heart failure: Secondary | ICD-10-CM | POA: Diagnosis not present

## 2016-04-12 DIAGNOSIS — R490 Dysphonia: Secondary | ICD-10-CM | POA: Diagnosis not present

## 2016-04-12 DIAGNOSIS — G20 Parkinson's disease: Secondary | ICD-10-CM | POA: Diagnosis not present

## 2016-04-12 DIAGNOSIS — F0281 Dementia in other diseases classified elsewhere with behavioral disturbance: Secondary | ICD-10-CM | POA: Diagnosis not present

## 2016-04-12 DIAGNOSIS — I872 Venous insufficiency (chronic) (peripheral): Secondary | ICD-10-CM | POA: Diagnosis not present

## 2016-04-12 NOTE — Telephone Encounter (Signed)
Please ask [name] to be sure [name] takes her lasix 40mg  twice today, tomorrow and Sunday and then goes back to 40mg  daily again on Monday.

## 2016-04-12 NOTE — Telephone Encounter (Signed)
[name] with [facility] called and stated that patient has had a weight gain of 3 lbs since 04/09/2016. Patient has swelling in her legs and faint wheezes. Please Advise.

## 2016-04-12 NOTE — Telephone Encounter (Signed)
Spoke with nurse and advised results, she will let patient know about medication change.

## 2016-04-15 ENCOUNTER — Ambulatory Visit: Payer: Self-pay | Admitting: *Deleted

## 2016-04-15 ENCOUNTER — Other Ambulatory Visit: Payer: Self-pay | Admitting: *Deleted

## 2016-04-15 DIAGNOSIS — R490 Dysphonia: Secondary | ICD-10-CM | POA: Diagnosis not present

## 2016-04-15 DIAGNOSIS — F0281 Dementia in other diseases classified elsewhere with behavioral disturbance: Secondary | ICD-10-CM | POA: Diagnosis not present

## 2016-04-15 DIAGNOSIS — I5032 Chronic diastolic (congestive) heart failure: Secondary | ICD-10-CM | POA: Diagnosis not present

## 2016-04-15 DIAGNOSIS — G20 Parkinson's disease: Secondary | ICD-10-CM | POA: Diagnosis not present

## 2016-04-15 DIAGNOSIS — I872 Venous insufficiency (chronic) (peripheral): Secondary | ICD-10-CM | POA: Diagnosis not present

## 2016-04-15 DIAGNOSIS — I110 Hypertensive heart disease with heart failure: Secondary | ICD-10-CM | POA: Diagnosis not present

## 2016-04-15 NOTE — Patient Outreach (Signed)
[facility]) Care Management [place] Telephone Outreach, Transition of Care, day 28 04/15/2016  [name] November 04, 1940 [id number]  Unsuccessful telephone outreach to [name], 75 y.o. female previously followed by [place] on referral from [place] after ED visit 01/19/16 for safety concerns; prior to that, patient had been followed previously by [facility] telephonic CM. Unfortunately, [name] was admitted to the hospital June 19-23, 2017 for bilateral LE edema and redness (cellulitis), where she was treated with IV antibiotics. Recommendation was made for patient to be discharged to [name], but patient refused [name] placement.  [name] is now followed by [place] for transition of care after recent IP hospitalization.  [facility] CSW and pharmacy is also actively involved in [name]'s care for community resource evaluation and medication safety/ compliance/ adherence issues.   HIPAA compliant VM msg left for [name] asking her to return my call.  Plan:  Will re-attempt telephone outreach for continued transition of care after recent hospital visit later this week if I do not hear back from [name] first.   [name], RN, BSN, [place] Care Management  [phone]

## 2016-04-17 ENCOUNTER — Other Ambulatory Visit: Payer: Self-pay | Admitting: Licensed Clinical Social Worker

## 2016-04-17 DIAGNOSIS — G20 Parkinson's disease: Secondary | ICD-10-CM | POA: Diagnosis not present

## 2016-04-17 DIAGNOSIS — I5032 Chronic diastolic (congestive) heart failure: Secondary | ICD-10-CM | POA: Diagnosis not present

## 2016-04-17 DIAGNOSIS — F0281 Dementia in other diseases classified elsewhere with behavioral disturbance: Secondary | ICD-10-CM | POA: Diagnosis not present

## 2016-04-17 DIAGNOSIS — I110 Hypertensive heart disease with heart failure: Secondary | ICD-10-CM | POA: Diagnosis not present

## 2016-04-17 DIAGNOSIS — R490 Dysphonia: Secondary | ICD-10-CM | POA: Diagnosis not present

## 2016-04-17 DIAGNOSIS — I872 Venous insufficiency (chronic) (peripheral): Secondary | ICD-10-CM | POA: Diagnosis not present

## 2016-04-17 NOTE — Patient Outreach (Signed)
[facility] Care Management  04/17/2016  [name] 06/07/1941 [id number]   Assessment-CSW completed outreach attempt today. CSW unable to reach patient successfully. CSW left a HIPPA compliant voice message encouraging patient to return call once available.  Plan-CSW will await return call or complete an additional outreach if needed.  [name], BSW, MSW, [email or web address] Phone: [phone] Fax: [phone]

## 2016-04-18 ENCOUNTER — Other Ambulatory Visit: Payer: Self-pay | Admitting: *Deleted

## 2016-04-18 DIAGNOSIS — I5032 Chronic diastolic (congestive) heart failure: Secondary | ICD-10-CM | POA: Diagnosis not present

## 2016-04-18 DIAGNOSIS — G20 Parkinson's disease: Secondary | ICD-10-CM | POA: Diagnosis not present

## 2016-04-18 DIAGNOSIS — R490 Dysphonia: Secondary | ICD-10-CM | POA: Diagnosis not present

## 2016-04-18 DIAGNOSIS — I872 Venous insufficiency (chronic) (peripheral): Secondary | ICD-10-CM | POA: Diagnosis not present

## 2016-04-18 DIAGNOSIS — I110 Hypertensive heart disease with heart failure: Secondary | ICD-10-CM | POA: Diagnosis not present

## 2016-04-18 DIAGNOSIS — F0281 Dementia in other diseases classified elsewhere with behavioral disturbance: Secondary | ICD-10-CM | POA: Diagnosis not present

## 2016-04-18 NOTE — Patient Outreach (Signed)
[facility] [facility]) Care Management [place] Telephone Outreach, Transition of care, day 31 04/18/2016  [name] [birth year] [id number]   Successful telephone outreach to [name], 75 y.o.femalepreviously followed by [place] on referral from [place] after ED visit 01/19/16 for safety concerns; prior to that, patient had been followed previously by [facility] telephonic CM.Unfortunately, [name] was admitted to the hospital June 19-23, 2051for bilateral LE edema and redness (cellulitis), where she was treated with IV antibiotics.Recommendation was made for patient to be discharged to [name], but patient refused [name] placement.[name] is now followed by [place] for transition of care after recent IP hospitalization. [facility] CSW and pharmacy is also actively involvedin [name]'s care for community resource evaluation and medication safety/ compliance/ adherence issues.   Today, [name] reports that she is "feeling pretty good," and denies pain or issues.  [name] states that she followed the instructions of her PCP to increase her lasix dosing over the weekend, based on the [facility] nurse reporting that patient had a weight gain of > 3 lbs last week; patient reports today that her leg swelling "isn't as bad; it has gone down."  [name] does not remember her last weight that was taken by the [facility] nurse, but states that her breathing "is the same as it always is," stating that she gets SOB with activity (as is her baseline), but recovers quickly when she rests.  [name] does not sound as if she is any distress during our phone call today.  [name] reports that she has continued to keep her medications together in the plastic medication bin we created, and that she has taken her medications as they are prescribed.  [name] remembered visiting with [facility] pharmacist, Lennette Bihari last week.  [name] reports that she has not had any recent provider appointments, but states  that she did attend her eye doctor about possible cataract surgery, stating that "he can't do the surgery, so he is sending me to [facility] this fall to see if the doctor there can do it."  [name] also reported today that she is continuing to work with Home Health services, and reported that she believes her "speech is improving."  Patient also reported that the lady she hired to assist her with her daily activities "is working out good," and comes to assist her "2 or 3 times a week."    [name] denied further questions, problems, issues, or concerns; we confirmed our next in-home visit, scheduled for next week.   Plan:  [name] will continue keeping all of her medications in the one centralized box, and will take her medications as prescribed.   [name] will attend her scheduled provider appointments.  [name] will continue actively working with [facility] services as ordered, and with [facility] CSW.  [name] will notify her providers for any concerns, issues, problems, or questions that arise.  Continued [facility] CM outreach next week for scheduled routine home visit.  [name] Rack, RN, BSN, [employer] [facility] [place] Care Management  [phone]

## 2016-04-19 DIAGNOSIS — F0281 Dementia in other diseases classified elsewhere with behavioral disturbance: Secondary | ICD-10-CM | POA: Diagnosis not present

## 2016-04-19 DIAGNOSIS — I110 Hypertensive heart disease with heart failure: Secondary | ICD-10-CM | POA: Diagnosis not present

## 2016-04-19 DIAGNOSIS — G20 Parkinson's disease: Secondary | ICD-10-CM | POA: Diagnosis not present

## 2016-04-19 DIAGNOSIS — I872 Venous insufficiency (chronic) (peripheral): Secondary | ICD-10-CM | POA: Diagnosis not present

## 2016-04-19 DIAGNOSIS — I5032 Chronic diastolic (congestive) heart failure: Secondary | ICD-10-CM | POA: Diagnosis not present

## 2016-04-19 DIAGNOSIS — R490 Dysphonia: Secondary | ICD-10-CM | POA: Diagnosis not present

## 2016-04-22 ENCOUNTER — Other Ambulatory Visit: Payer: Self-pay | Admitting: Licensed Clinical Social Worker

## 2016-04-22 NOTE — Patient Outreach (Signed)
[facility]) Care Management  04/22/2016  LESETTE BUSEY Apr 24, 1941 [id number]   Assessment- CSW completed outreach to patient on 04/22/16. Patient is agreeable to home visit tomorrow.  Plan-CSW will complete home visit tomorrow and assess social work needs.  [name], BSW, MSW, [email or web address] Phone: [phone] Fax: [phone]

## 2016-04-23 ENCOUNTER — Other Ambulatory Visit: Payer: Self-pay | Admitting: Pharmacist

## 2016-04-23 ENCOUNTER — Other Ambulatory Visit: Payer: Self-pay | Admitting: Licensed Clinical Social Worker

## 2016-04-23 DIAGNOSIS — I5032 Chronic diastolic (congestive) heart failure: Secondary | ICD-10-CM | POA: Diagnosis not present

## 2016-04-23 DIAGNOSIS — G20 Parkinson's disease: Secondary | ICD-10-CM | POA: Diagnosis not present

## 2016-04-23 DIAGNOSIS — I110 Hypertensive heart disease with heart failure: Secondary | ICD-10-CM | POA: Diagnosis not present

## 2016-04-23 DIAGNOSIS — F0281 Dementia in other diseases classified elsewhere with behavioral disturbance: Secondary | ICD-10-CM | POA: Diagnosis not present

## 2016-04-23 DIAGNOSIS — I872 Venous insufficiency (chronic) (peripheral): Secondary | ICD-10-CM | POA: Diagnosis not present

## 2016-04-23 DIAGNOSIS — R490 Dysphonia: Secondary | ICD-10-CM | POA: Diagnosis not present

## 2016-04-23 NOTE — Patient Outreach (Signed)
[facility]) Care Management  [facility] Social Work  04/23/2016  [name] 1940/11/04 [id number]   Encounter Medications:  Outpatient Encounter Prescriptions as of 04/23/2016  Medication Sig Note  . carbidopa-levodopa (SINEMET IR) 25-100 MG tablet Take 1 at 6a, 1 1/2 pills at 9a, 1 at noon, 1 1/2 pills at 3p, 1 at 6pm, 1 1/2 pills at 9pm   . Cholecalciferol (VITAMIN D) 2000 units CAPS Take by mouth daily. Reported on 04/10/2016   . clindamycin (CLEOCIN) 300 MG capsule Take 1 capsule (300 mg total) by mouth 3 (three) times daily. (Patient not taking: Reported on 04/02/2016) 04/02/2016: Patient reports completed doses; verified empty bottle  . docusate sodium (COLACE) 100 MG capsule Take 100 mg by mouth daily. Reported on 02/28/2016   . ferrous sulfate 325 (65 FE) MG EC tablet Take 1 tablet (325 mg total) by mouth daily.   . furosemide (LASIX) 40 MG tablet Take 1 tablet (40 mg total) by mouth daily.   Marland Kitchen latanoprost (XALATAN) 0.005 % ophthalmic solution Place 1 drop into the left eye daily at 6 PM.    . LORazepam (ATIVAN) 0.5 MG tablet Take 1 tablet (0.5 mg total) by mouth 2 (two) times daily as needed for anxiety. 04/11/2016: Reports she has not used in the past 7 days  . Melatonin 5 MG TABS Take one tablet by mouth one hour before bedtime for sleep   . Multiple Vitamins-Minerals (MULTIVITAMIN WITH MINERALS) tablet Take 1 tablet by mouth daily.   Marland Kitchen oxyCODONE (OXY IR/ROXICODONE) 5 MG immediate release tablet Take 1 tablet (5 mg total) by mouth 2 (two) times daily as needed for severe pain. 04/10/2016: States she has not used in the past 7 days.    Vladimir Faster Glycol-Propyl Glycol (SYSTANE OP) Place 1 drop into the right eye daily at 6 PM.   . pramipexole (MIRAPEX) 1 MG tablet Take 1 tablet (1 mg total) by mouth 3 (three) times daily.    No facility-administered encounter medications on file as of 04/23/2016.     Functional Status:  In your present state of health, do you have any  difficulty performing the following activities: 03/11/2016 02/16/2016  Hearing? N -  Vision? N -  Difficulty concentrating or making decisions? N -  Walking or climbing stairs? Y -  Dressing or bathing? Y -  Doing errands, shopping? Y -  Conservation officer, nature and eating ? - Y  Using the Toilet? - N  In the past six months, have you accidently leaked urine? - N  Do you have problems with loss of bowel control? - N  Managing your Medications? - N  Managing your Finances? - N  Housekeeping or managing your Housekeeping? - N  Some recent data might be hidden    Fall/Depression Screening:  PHQ 2/9 Scores 03/29/2016 03/19/2016 02/21/2016 01/30/2016 01/26/2016 01/16/2016 12/29/2015  PHQ - 2 Score 0 0 0 0 0 0 0    Assessment: CSW completed home visit today on 04/23/16. When CSW arrived, patient was watering her flowers outside and seemed a lot more mobile than before. Patient reports that her occupational therapist and speech therapist will be completing their discharge sessions later today. Patient reports that she is now receiving personal care services with personal care. She states that has an aide that comes to her residence twice per week on tuesdays and fridays from 12-3 but she is trying to change her days and times that the aide will come because she prefers mornings.  Patient reports that she is paying $17.50 per hour for aide. Patient's home continues to be alarming because there is still a huge amount of clutter and mess in her bathroom, bedroom and kitchen. Patient reports that she went to church this past Sunday and has been sitting outside on her front porch.    When CSW was completing assessment, her personal care aide arrived. Her personal care aide reports that there was a "major meeting" yesterday with her supervisors and that there are many concerns in regards to patient and they are unsure if they are unable to continue to provide services in relation to her living environment. Aide states that another  aide completed an evaluation last Friday and found that patient's home was unsafe for herself and unsafe for anyone to work there. Aide reports that patient has been hoarding items and they have asked several times if they can clean her bathroom, bedroom and kitchen and she has not been agreeable. The aide reports that they were maggots in plates of food left in the kitchen and mice feces around the kitchen and bedroom area. The aide reports that she has seen several mice around the home. Aide states that she tired to encourage patient to be agreeable to allow her to clean her home but she is only allowing her to clean out her refrigerator and sweep and mop the floors. Aide reports that she tried to clean out her bedroom but patient "will not let me touch anything." CSW contacted patient's brother and updated him. He did not have much to say besides "I come there daily. I will come up there today and talk to her." Patient became very upset when Aide was discussing her concerns with her. Patient reports "I don't want them throwing away my treasures. They can't just throw away my stuff." CSW provided emotional support. Patient seems to be suffering from obsessive compulsive disorder and is hoarding anything and everything that she can. Patient's occupational and speech therapist from [facility] came and they were updated. All staff tried to have a discussion with patient. CSW provide appropriate interventions and patient was finally agreeable to "allowing the aide to re organize her belongings but not throw anything away." However, the aide received a phone call during the visit and she reports that she must leave as her agency states that it is a health risk for her to continue to provide services in the home when patient is not agreeable to allowing her to clean up her belongings. Aide was still on the phone when [place] left. CSW is hopeful that patient will be agreeable to allowing aide to re organize her belongings  instead of throwing items away. When patient prefers the wording "re organizing" instead of throwing items away as she becomes very emotional distressed when she fears that her belongings will be thrown away. Patient finally started to pay out of pocket for personal care services that she desperately needs but this agency seems to no longer be able to assist her due to her living environment concerns.   [facility] CM Care Plan Problem Three   Flowsheet Row Most Recent Value  Care Plan for Problem Three  Active  [facility] Long Term Goal (31-90) days  Patient will be investigated by APS due to lack of support within the home.   [facility] Long Term Goal Start Date  01/30/16  [facility] Long Term Goal Met Date  04/23/16  Interventions for Problem Three Long Term Goal  APS completed evaluation and closed  case as they felt patient could make decisions for herself.  [facility] CM Short Term Goal #1 (0-30 days)  Patient will allow personal care agency to assist her with cleaning the home or they will no longer provide services as it is a hazard. Patient will be willing to compromise with personal care agency within 30 days as evidenced by her need for safety  [facility] CM Short Term Goal #1 Start Date  04/23/16  Interventions for Short Term Goal #1  CSW has completed home visit and had discussion with patient, occupational therapist, aide and speech pathologist.She has been encouraged to compromise with the cleaning and organizing of her home. CSW implemented appropriate interventions to deal with patient's hoarding.    [facility] CM Short Term Goal #3 (0-30 days)  Patient will ask family members if they can assist with de cluttering home within 30 days  [facility]  CM Short Term Goal #3 Start Date  04/23/16  [facility] CM Short Term Goal #3 Met Date  -- [NOT MET]  Interventions for Short Term Goal #3  CSW and Home Health have encouraged patient to ask family members to assist her with de cluttering home to free up space and prevent falls. Patient has made no progress  with goal. Goal is ongoing.       Plan: CSW will route encounter to PCP, [facility] RNCM and [facility] Pharmacist. CSW will follow up in two weeks.  [name], BSW, MSW, [email or web address] Phone: [phone] Fax: [phone]

## 2016-04-23 NOTE — Progress Notes (Signed)
Wow, [name], her environment sounds worse all the time.  It's very sad.  Thank you for your help.  I hope she will permit re-organizing.

## 2016-04-23 NOTE — Patient Outreach (Signed)
Attempted to reach patient to follow-up on her medication needs.    There was no answer on phone and it kept ringing with no voicemail picking up.    Plan:  Will attempt to reach patient again in the next 7-10 days.   Karrie Meres, PharmD, [place] [phone]

## 2016-04-25 ENCOUNTER — Encounter: Payer: Self-pay | Admitting: *Deleted

## 2016-04-25 ENCOUNTER — Other Ambulatory Visit: Payer: Self-pay | Admitting: *Deleted

## 2016-04-25 DIAGNOSIS — R490 Dysphonia: Secondary | ICD-10-CM | POA: Diagnosis not present

## 2016-04-25 DIAGNOSIS — I110 Hypertensive heart disease with heart failure: Secondary | ICD-10-CM | POA: Diagnosis not present

## 2016-04-25 DIAGNOSIS — I5032 Chronic diastolic (congestive) heart failure: Secondary | ICD-10-CM | POA: Diagnosis not present

## 2016-04-25 DIAGNOSIS — I872 Venous insufficiency (chronic) (peripheral): Secondary | ICD-10-CM | POA: Diagnosis not present

## 2016-04-25 DIAGNOSIS — F0281 Dementia in other diseases classified elsewhere with behavioral disturbance: Secondary | ICD-10-CM | POA: Diagnosis not present

## 2016-04-25 DIAGNOSIS — G20 Parkinson's disease: Secondary | ICD-10-CM | POA: Diagnosis not present

## 2016-04-25 NOTE — Patient Outreach (Signed)
[place] [facility]) Care Management  Leslie Routine Home visit 04/25/2016  ARMONIE METTLER August 01, 1941 [id number]  ZAARA SPROWL is an 75 y.o. female previously followed by [place] on referral from [place] after ED visit 01/19/16 for safety concerns; prior to that, patient had been followed previously by [facility] telephonic CM.  Ms. Bour was admitted to the hospital June 19-23, 2017 for bilateral LE edema and redness (cellulitis).  Recommendation was made for patient to be discharged to [name], but patient refused placement.  Ms. Balash was followed by [place] post-hospital discharge for transition of care, which was completed without re-admission to hospital.  [place] and pharmacy is also actively involved in Ms. Los's care for community resource evaluation and medication safety/ compliance/ adherence issues.    Today, Ms. Patella continues to report that she is "feeling pretty good," and denies pain, falls, or other issues.  Ms. Ungar reports that the personal assistant she hired to help her with light housekeeping and general assistance of needs "quit," stating, "she didn't like working here."  Ms. Kopke reports that her home health [facility]) PT and OT services were completed "last week," and states that the [facility] speech therapy will have their last session with her this afternoon; Ms. Munce reports that she is not sure if [facility] nursing services will be continuing or not, and states, "I don't think they will be coming for much longer, if they come back at all."    Ms. Keniston stated that her last weight taken by the [facility] nurse was "170 lbs."  Patient states that she can not weigh herself today accurately, stating that she doesn't want to let go of her walker, even with my offered assistance; patient's stated weight was unable to be verified during today's visit.    Ms. Docter states that her breathing has been "the same as it always is," stating that she gets SOB with activity "at times," (as is  her baseline), but recovers quickly when she rests.  Ms. Mulkern does not demonstrate any distress during our visit today during periods of activity or rest.   Ms. Nikolic reports that she has continued to keep her medications together in the plastic medication bin we created during initial [place] home visit, and that she has taken her medications as they are prescribed.  All medications were thoroughly reviewed with patient during today's visit.  During medication review, I noted that 2 of Ms. Pidcock medications were not placed in bin that we created during [place] initial home visit with the purpose of keeping medications in centralized location within patient's home; however, Ms. Thomann was able to easily locate the medications that were not in bin, stating that she left them out because she "just took those pills and hadn't had time yet to put them back."  We again thoroughly reviewed the necessity for patient to keep all medications together in one centralized area of her home, and Ms. Castelli reports that the system we created together has "been working fine" for her, and she denies need/ want to change that system.  I applied the Nystatin and Triamcinolone creams (30 minutes apart form one another) to her lower legs bilaterally, which show great improvement in appearance since initial [place] home visit.  Legs bilaterally have no significant erythema or swelling today.  I assisted Ms. Daffron with re-ordering these creams from her pharmacy, which she states her brother will pick up/ deliver to  her.  We discussed today that Ms. Runions has almost met all of her established [facility] Community CM goals, and discussed that she may soon be ready for discharge from [place] program.  Ms. Stansbery expressed that she would like for me to return to visit her again, since [name] services will be ending soon, so that she "doesn't run into trouble," and "have to go back to the hospital."  We thoroughly  discussed the need for Ms. Schnitzler to contact her providers early if she develops concerns about her state of health, and she agreed to do so.  Ms. Breeland also agreed to call this [place] RN CM for any issues or problems she develops prior to our next scheduled [place] in-home visit, which we scheduled today for later this month.    Ms. Simoneaux denied further questions, problems, issues, or concerns today.  Subjective:  "I think I am still doing much better and I would like for you to come back to see me again since my home health is about to end."  Objective:    BP (!) 158/70   Pulse 82   Resp 16   SpO2 96%    Review of Systems  Constitutional: Negative.  Negative for malaise/fatigue and weight loss.  Respiratory: Negative.  Negative for cough, sputum production, shortness of breath and wheezing.   Gastrointestinal: Negative for abdominal pain and nausea.  Musculoskeletal: Negative.  Negative for back pain, falls, joint pain and myalgias.  Neurological: Negative for tremors and weakness.       History of Parkinson's disease  Psychiatric/Behavioral: Negative.  Negative for depression. The patient is not nervous/anxious.     Physical Exam  Constitutional: She is oriented to person, place, and time. She appears well-developed and well-nourished.  Cardiovascular: Normal rate, regular rhythm, normal heart sounds and intact distal pulses.   Pulses:      Radial pulses are 2+ on the right side, and 2+ on the left side.       Dorsalis pedis pulses are 2+ on the right side, and 2+ on the left side.  Respiratory: Breath sounds normal. No respiratory distress. She has no wheezes. She has no rales.  GI: Soft. Bowel sounds are normal.  Neurological: She is alert and oriented to person, place, and time.  Skin: Skin is warm, dry and intact. No erythema.  Bilateral lower legs are slightly pink with no significant erythema noted; minimal +0.5-1.0 bilateral ankle edema; appearance of lower  legs significantly improved from last [place] home visit  Psychiatric: She has a normal mood and affect. Her behavior is normal. Judgment and thought content normal.    Encounter Medications:   Outpatient Encounter Prescriptions as of 04/25/2016  Medication Sig Note  . carbidopa-levodopa (SINEMET IR) 25-100 MG tablet Take 1 at 6a, 1 1/2 pills at 9a, 1 at noon, 1 1/2 pills at 3p, 1 at 6pm, 1 1/2 pills at 9pm   . Cholecalciferol (VITAMIN D) 2000 units CAPS Take by mouth daily. Reported on 04/10/2016   . clindamycin (CLEOCIN) 300 MG capsule Take 1 capsule (300 mg total) by mouth 3 (three) times daily. (Patient not taking: Reported on 04/02/2016) 04/02/2016: Patient reports completed doses; verified empty bottle  . docusate sodium (COLACE) 100 MG capsule Take 100 mg by mouth daily. Reported on 02/28/2016   . ferrous sulfate 325 (65 FE) MG EC tablet Take 1 tablet (325 mg total) by mouth daily.   . furosemide (LASIX) 40 MG  tablet Take 1 tablet (40 mg total) by mouth daily.   Marland Kitchen latanoprost (XALATAN) 0.005 % ophthalmic solution Place 1 drop into the left eye daily at 6 PM.    . LORazepam (ATIVAN) 0.5 MG tablet Take 1 tablet (0.5 mg total) by mouth 2 (two) times daily as needed for anxiety. 04/11/2016: Reports she has not used in the past 7 days  . Melatonin 5 MG TABS Take one tablet by mouth one hour before bedtime for sleep   . Multiple Vitamins-Minerals (MULTIVITAMIN WITH MINERALS) tablet Take 1 tablet by mouth daily.   Marland Kitchen oxyCODONE (OXY IR/ROXICODONE) 5 MG immediate release tablet Take 1 tablet (5 mg total) by mouth 2 (two) times daily as needed for severe pain. 04/10/2016: States she has not used in the past 7 days.    Vladimir Faster Glycol-Propyl Glycol (SYSTANE OP) Place 1 drop into the right eye daily at 6 PM.   . pramipexole (MIRAPEX) 1 MG tablet Take 1 tablet (1 mg total) by mouth 3 (three) times daily.    No facility-administered encounter medications on file as of 04/25/2016.     Functional  Status:   In your present state of health, do you have any difficulty performing the following activities: 03/11/2016 02/16/2016  Hearing? N -  Vision? N -  Difficulty concentrating or making decisions? N -  Walking or climbing stairs? Y -  Dressing or bathing? Y -  Doing errands, shopping? Y -  Conservation officer, nature and eating ? - Y  Using the Toilet? - N  In the past six months, have you accidently leaked urine? - N  Do you have problems with loss of bowel control? - N  Managing your Medications? - N  Managing your Finances? - N  Housekeeping or managing your Housekeeping? - N  Some recent data might be hidden    Fall/Depression Screening:    PHQ 2/9 Scores 03/29/2016 03/19/2016 02/21/2016 01/30/2016 01/26/2016 01/16/2016 12/29/2015  PHQ - 2 Score 0 0 0 0 0 0 0    Assessment:  Safety issues with Ms. Dack medications that were identified during initial [place] in-home visit appear to have improved, and Ms. Yera appears to and reports compliance/ adherence with her medications.  Ms. Wierenga appears to have been using her medication bin to keep all of medications together in one central location.  Ms. Ruybal lower legs show significant improvement since initial [place] home visit.  Ms. Beazley would like to have at least one more [place] in-home visit, since home health services will soon be completed.  Plan:   Ms. Qualls will continue keeping all of her medications in the one centralized box, and will take her medications as prescribed.    Ms. Hunsberger will attend her scheduled provider appointments.   Ms. Sheperd will continue actively working with [facility] services as ordered until services are completed, as well as with [facility] CSW.   Ms. Pascarella will notify her providers for any concerns, issues, problems, or questions that arise.   Continued [facility] Community CM outreach scheduled today with patient for later this month.   [name], RN, BSN, [facility] Care Management  [phone]

## 2016-04-26 ENCOUNTER — Other Ambulatory Visit: Payer: Self-pay | Admitting: Pharmacist

## 2016-04-26 DIAGNOSIS — I872 Venous insufficiency (chronic) (peripheral): Secondary | ICD-10-CM | POA: Diagnosis not present

## 2016-04-26 DIAGNOSIS — I110 Hypertensive heart disease with heart failure: Secondary | ICD-10-CM | POA: Diagnosis not present

## 2016-04-26 DIAGNOSIS — F0281 Dementia in other diseases classified elsewhere with behavioral disturbance: Secondary | ICD-10-CM | POA: Diagnosis not present

## 2016-04-26 DIAGNOSIS — R490 Dysphonia: Secondary | ICD-10-CM | POA: Diagnosis not present

## 2016-04-26 DIAGNOSIS — I5032 Chronic diastolic (congestive) heart failure: Secondary | ICD-10-CM | POA: Diagnosis not present

## 2016-04-26 DIAGNOSIS — G20 Parkinson's disease: Secondary | ICD-10-CM | POA: Diagnosis not present

## 2016-04-26 NOTE — Patient Outreach (Signed)
[place] [facility]) Care Management  04/26/2016  [name] 09-30-1940 [id number]  [name] was referred to [place] for medication review due to concerns she was taking medications that were no longer on her medication list in chart.   Placed a follow-up phone call to patient today.  She verified her HIPAA details.    Patient states she is continuing to take medication from the plastic bin that they are organized in.   She denies questions/concerns regarding her medications at this time.  She states she is not interested in a pill planner or pharmacy blister packaged medications at this time.    Plan:  Given patient denies further medication concerns at this time and reports she continues to take her medications from one plastic bin,  Will close pharmacy case at this time.   Will send in-basket message to [name], [name] and [name], [facility] LCSW.    [name] is happy to assist patient if needed in the future.   [name], PharmD, [place] [phone]

## 2016-05-02 ENCOUNTER — Encounter: Payer: Self-pay | Admitting: Internal Medicine

## 2016-05-02 ENCOUNTER — Other Ambulatory Visit: Payer: Self-pay | Admitting: Licensed Clinical Social Worker

## 2016-05-02 NOTE — Patient Outreach (Signed)
[place] [facility]) Care Management  05/02/2016  [name] 1941-04-12 [id number]   Assessment- CSW completed call to patient on 05/02/16 and patient answered successfully. She reports that she is "doing okay" and that she is not experiencing any pain or discomfort. Patient reports that she has been putting the creams on her legs as prescribed. She reports that home health is no longer involved. CSW questioned if the aide from personal care was still completing visits and she declined. However, she reports that she has a new aide but cannot state where she is from. CSW has difficulty understanding patient today.  Plan-CSW will follow up within two weeks.  [name], BSW, MSW, [email or web address] Phone: [phone] Fax: [phone]

## 2016-05-06 ENCOUNTER — Other Ambulatory Visit: Payer: Self-pay | Admitting: *Deleted

## 2016-05-06 ENCOUNTER — Ambulatory Visit: Payer: Self-pay | Admitting: Licensed Clinical Social Worker

## 2016-05-06 NOTE — Patient Outreach (Signed)
[place] [facility]) Care Management [place] Telephone Outreach 05/06/2016  LIBERTI SUITTER 1941/03/18 [id number]   Successful telephone outreach to [name], 75 y.o. female previously followed by [place] on referral from [place] after ED visit 01/19/16 for safety concerns; prior to that, patient had been followed previously by [facility] telephonic CM.Ms. Ragucci was admitted to the hospital June 19-23, 2081for bilateral LE edema and redness (cellulitis).Recommendation was made for patient to be discharged to [name], but patient refused placement.Ms. Roskelley was followed by [place] CM post-hospital discharge for transition of care, which was completed without re-admission to hospital. [facility] CSW and pharmacyis also actively involvedin Ms. Ostrow's care for community resource evaluation and medication safety/ compliance/ adherence issues.   Received notification this afternoon through secure EMR messaging that patient had placed a phone call last late night to [facility] CM 24-hour nurse line, where she reported having auditory hallucination and "hearing voices."  From review of the 24-hour nurse call notes, the patient stated that she was hearing voices and was advised by the nurse last night to contact EMS/ call 911.  Today, Ms. Zales reports that "everything is fine.... I am fine."  Patient denies that she is hearing voices now and stated that she "didn't know" what she heard last night.  Patient denies pain or discomfort today.  Patient stated that her personal assistant was present in the home with her during our call, and Ms. Osterlund asked that I speak with her.  Ms. Breitman personal assistant, "[name]" stated that she had been at patients home for several hours assisting her with "straightening up," and [name] verified that the patient was not in any apparent distress, as the patient reported to me.  [name] reported that she assisted Ms. Westerlund with bathing and applying her  medications to her legs today.  When Ms. Lucking returned to the call, she reported to me that she had been taking her medications as they are prescribed, using the centralized medication bin that she and I had created during previous [place] in-home visit.  Ms. Grunke denied further concerns, issues, needs, or problems.  We confirmed our previously scheduled next [place] in-home visit, and I encouraged ms. Kohles to contact EMS/ call 911 should she experience further hallucinations of any kind, as well as to contact her PCP if this occurred again, and patient agreed to do so.  Plan:  Ms. Pincock will call EMS/ 911 if she experiences further episodes of any type of hallucination, and she will contact her PCP should this recur.  Ms. Dashner will continue keepingall of her medications in the one centralized box, and will take her medications as prescribed.   Ms. Brenda will attend her scheduled provider appointments.  Ms. Teuscher will continue actively working with [facility] CSW.  Ms. Belew will notify her providers for any concerns, issues, problems, or questions that arise.  Continued [facility] Community CM outreach scheduled with in-home visit for later this month.   [name], RN, BSN, [place] Care Management  [phone]

## 2016-05-13 ENCOUNTER — Other Ambulatory Visit: Payer: Self-pay | Admitting: Licensed Clinical Social Worker

## 2016-05-13 NOTE — Patient Outreach (Signed)
[place] [facility]) Care Management  05/13/2016  [name] [birth year] [id number]   Assessment- CSW completed outreach to patient. Patient answered successfully. Patient reports that she is doing "okay." She is agreeable to a home visit tomorrow.  Plan-CSW will complete home visit tomorrow to assist her with social work needs.  [name], BSW, MSW, [email or web address] Phone: [phone] Fax: [phone]

## 2016-05-14 ENCOUNTER — Other Ambulatory Visit: Payer: Self-pay | Admitting: Licensed Clinical Social Worker

## 2016-05-14 NOTE — Patient Outreach (Signed)
[place] [facility]) Care Management  [facility] Social Work  05/14/2016  [name] Oct 05, 1940 [id number]  Encounter Medications:  Outpatient Encounter Prescriptions as of 05/14/2016  Medication Sig Note  . carbidopa-levodopa (SINEMET IR) 25-100 MG tablet Take 1 at 6a, 1 1/2 pills at 9a, 1 at noon, 1 1/2 pills at 3p, 1 at 6pm, 1 1/2 pills at 9pm   . Cholecalciferol (VITAMIN D) 2000 units CAPS Take by mouth daily. Reported on 04/10/2016   . clindamycin (CLEOCIN) 300 MG capsule Take 1 capsule (300 mg total) by mouth 3 (three) times daily. (Patient not taking: Reported on 04/02/2016) 04/02/2016: Patient reports completed doses; verified empty bottle  . docusate sodium (COLACE) 100 MG capsule Take 100 mg by mouth daily. Reported on 02/28/2016   . ferrous sulfate 325 (65 FE) MG EC tablet Take 1 tablet (325 mg total) by mouth daily.   . furosemide (LASIX) 40 MG tablet Take 1 tablet (40 mg total) by mouth daily.   Marland Kitchen latanoprost (XALATAN) 0.005 % ophthalmic solution Place 1 drop into the left eye daily at 6 PM.    . LORazepam (ATIVAN) 0.5 MG tablet Take 1 tablet (0.5 mg total) by mouth 2 (two) times daily as needed for anxiety. 04/11/2016: Reports she has not used in the past 7 days  . Melatonin 5 MG TABS Take one tablet by mouth one hour before bedtime for sleep   . Multiple Vitamins-Minerals (MULTIVITAMIN WITH MINERALS) tablet Take 1 tablet by mouth daily.   Marland Kitchen oxyCODONE (OXY IR/ROXICODONE) 5 MG immediate release tablet Take 1 tablet (5 mg total) by mouth 2 (two) times daily as needed for severe pain. 04/10/2016: States she has not used in the past 7 days.    Vladimir Faster Glycol-Propyl Glycol (SYSTANE OP) Place 1 drop into the right eye daily at 6 PM.   . pramipexole (MIRAPEX) 1 MG tablet Take 1 tablet (1 mg total) by mouth 3 (three) times daily.    No facility-administered encounter medications on file as of 05/14/2016.     Functional Status:  In your present state of health, do you have any  difficulty performing the following activities: 03/11/2016 02/16/2016  Hearing? N -  Vision? N -  Difficulty concentrating or making decisions? N -  Walking or climbing stairs? Y -  Dressing or bathing? Y -  Doing errands, shopping? Y -  Conservation officer, nature and eating ? - Y  Using the Toilet? - N  In the past six months, have you accidently leaked urine? - N  Do you have problems with loss of bowel control? - N  Managing your Medications? - N  Managing your Finances? - N  Housekeeping or managing your Housekeeping? - N  Some recent data might be hidden    Fall/Depression Screening:  PHQ 2/9 Scores 05/14/2016 03/29/2016 03/19/2016 02/21/2016 01/30/2016 01/26/2016 01/16/2016  PHQ - 2 Score 0 0 0 0 0 0 0    Assessment: CSW completed home visit on 05/14/16. Patient was in her bedroom watching television when CSW arrived. Patient's appearance has improved as well as her home environment. During last home visit, patient was encouraged to allow her aide from personal care to assist her with cleaning home as it was a danger to the employees and herself but she was not willing. However, patient has seemed to make excellent progress. Her bedroom and kitchen area are not as cluttered. Patient questioned if she got rid of a lot of her belongings. She reported "My aide  has been helping me but we have a long way to go." Patient continued to say throughout session "We got more work to do." Patient reports that she has a new aide who has assisted her with cleaning. She reports that she feels more comfortable with her than her last aide with cleaning out her home. She has more control in these situations as she is able to state what she wishes to keep and what she wishes to get rid of. CSW appraised patient for this great success. Patient is not anxious or upset when discussing getting rid of her belongings in order to improve mobility in the home. CSW implemented appropriate emotional support. CSW is very impressed with  patient's success.   Patient has an aide named Huntley Estelle who was recommended to her by her Electrical engineer. She reports that it is a small business but she does not know the name. She reports that she is paying her aide $12.00 per hour which is a reasonable price and cheaper than personal care. She states that she comes 3 days per week for 4 hours. She reports that her aide took her to get a hair cut yesterday and to go grocery shopping. She states that her aide assist her with cleaning, get dressed, bathing, shopping and assisting her with cooking meals. Patient no longer receives home health and states that she misses the company. CSW reviewed socialization opportunities with patient. She reports that she bought a brand new ukulele this past week and has a book and dvd to teach her how to play it. Patient has been unable to play music in many years and this has been a healthy coping method for her. CSW reviewed upcoming appointments with patient.    [facility] CM Care Plan Problem Three   Flowsheet Row Most Recent Value  Care Plan Problem Three  Lack of community resources and family/community/social support   Role Documenting the Problem Three  Clinical Social Worker  Care Plan for Problem Three  Active  [facility] Long Term Goal (31-90) days  Patient will implement healthy coping tools into her routine within 90 days as evidenced by her isolation and need for fulfilment.  [facility] Long Term Goal Start Date  05/14/16  Interventions for Problem Three Long Term Goal  CSW will complete home visit and encourage patient to do activities to improve her well being such as playing music to decrease sadness (patient recently got an ukulee, going to church, going out in the community and spending time with family.   [facility] CM Short Term Goal #1 (0-30 days)  Patient will allow personal care agency to assist her with cleaning the home or they will no longer provide services as it is a hazard. Patient will be willing to  compromise with personal care agency within 30 days as evidenced by her need for safety  [facility] CM Short Term Goal #1 Start Date  04/23/16  [facility] CM Short Term Goal #1 Met Date  05/14/16  Interventions for Short Term Goal #1  Patient successfully met goal but finding a new caregiver who assisted her with cleaning out her house and getting rid of things she does not need. Goal met. Social work support provided as well as Tax adviser for meeting goal.   [facility] CM Short Term Goal #2 (0-30 days)  Patient will continue to clean her home to allow her more mobility in her home as evidenced by her hoarding and need for more room in her home to prevent falls  and danger  [facility] CM Short Term Goal #2 Start Date  05/14/16  Interventions for Short Term Goal #2  CSW will provide emotional support and will encourage her to clean home as needed. Patient has a new caregiver who has already assisted her with cleaning her home but patient wishes to do more within the next 30 days.  [facility] CM Short Term Goal #3 (0-30 days)  Patient will ask family members if they can assist with de cluttering home within 30 days  THN  CM Short Term Goal #3 Start Date  04/23/16  [facility] CM Short Term Goal #3 Met Date  05/14/16 [NOT MET]  Interventions for Short Term Goal #3  Goal met and achieved.       Plan: CSW will complete home visit or phone call within 30 days. Patient has made great progress this month. CSW will route encounter to PCP.  [name], BSW, MSW, [email or web address] Phone: [phone] Fax: [phone]

## 2016-05-16 ENCOUNTER — Other Ambulatory Visit: Payer: Self-pay | Admitting: *Deleted

## 2016-05-16 ENCOUNTER — Encounter: Payer: Self-pay | Admitting: *Deleted

## 2016-05-16 NOTE — Patient Outreach (Signed)
[name]) Care Management  [name] Routine Home visit 05/16/2016  [name] Aug 18, 1941 [id number]  "[name]" [name] is an 75 y.o. female previously followed by [place] on referral from [place] after ED visit 01/19/16 for safety concerns; prior to that, patient had been followed previously by [facility] telephonic CM.[name] was admitted to the hospital June 19-23, 2085fr bilateral LE edema and redness (cellulitis).Recommendation was made for patient to be discharged to [name], but patient refused placement.MDayanirawas followed by [facility]post-hospital discharge for transition of care, which was completed without re-admission to hospital. [name] is also actively iWinn-Dixiecare for cGannett Coevaluation.   Today, Ms. BCallowayreports that she is "feeling good," and denies pain, falls, or other concerning issues.  Ms. BMattonreports that she has a new personal care assistant she hired to help her with light housekeeping and general assistance of needs, "[name]" [facility]  There is a visible difference in the appearance of both MRikkiand her home environment from previous [place] visits; both patient and her environment appear cleaner and less cluttered/ disheveled.  MJeslynnlooks very pretty in a dress and appears/ sounds to be very happy.  Update on ongoing issues:  -- home health ([facility] PT and OT services have stopped; [name] voices no issues with her health since [name], and provides an accurate reporting of previous and future provider appointments.  MDenniscontinues actively working with TV[facility] CenterCS[facility] BEula Friedon identified community resource needs.   -- self health management of Parkinson's Disease, HTN, LE cellulitis: due to [name]'s severe state of Parkinson's disease, she is unable to weigh herself regularly, as she is not steady on her feet and requires the use of a rolling walker 100% of the time.   [name] and I assisted MEunain obtaining her weight today, 174 lbs.  There is no change in [name]'s breathing; she becomes short of breath with activity which is her baseline, and recovers quickly when she rests.  She is not in distress during our visit today during periods of activity or while at rest.  [name]'s bilateral LE continue to be slightly swollen (see ROS/PE), but do not appear significantly erythematous compared to pWelshin-home visits.  MEvangelenereports no further issues with "hearing voices" as she reported to 24-hour nurse call line on May 05, 2016 (see 05-06-16 note)   -- Medication safety/ adherence/ compliance:  MDaelynnreports that she has continued to keep her medications together in the plastic medication bin we created during initial TKirtlandhome visit, and that she has taken her medications as they are prescribed.MAsleywas able to easily locate her medications, which were thoroughly reviewed with her during today's visit.  MNikireports that the system we previously created together has "been working fine" for her, and she denies need/ want to change that system.  I discovered a full bottle of gabapentin in [name]'s room, which is not on her current medication list, and was prescribed in 2016.  MShannarareports that this bottle of pills was found during [name]'s cleaning efforts.  I [facility] these pills [facility] and took to TTerrytownfor proper disposal.  We again discussed that MEmileehas almost met all of her established [facility] CM goals, and discussed that she may soon be ready for discharge from TW[place]fall Cityprogram.  I provided positive re-inforcement and encouragement  to [name] for the strides she has taken in managing her chronic health issues and her medications.  We again discussed the need for [name] to contact her providers early if she develops concerns about her state of health, and she agreed to do so.  Ms. [name] also agreed to call this [place] RN CM for any issues or problems she develops prior to our next scheduled [place] CM in-home visit, which we scheduled for next month, at which time, if [name] has continued to do well and met her goal around medication management, will be for discharge from [place] program.    Ms. [name] denied further questions, problems, issues, or concerns today.  Subjective: "I am doing great!  I got a ukelele and am learning how to play it!  I love my new helper!"  Objective:    BP 138/78   Pulse 82   Resp 16   Wt 174 lb (78.9 kg)   SpO2 96%   BMI 29.87 kg/m    Review of Systems  Constitutional: Negative.  Negative for fever, malaise/fatigue and weight loss.  Respiratory: Positive for shortness of breath. Negative for cough and wheezing.        Patient has baseline shortness of breath with activity; no change noted from previous [place] in-home visits; patient recovers from activity associated SOB within 60 seconds of rest  Cardiovascular: Positive for leg swelling. Negative for chest pain.       Bilateral LE minimally swollen, with (L) > (R)  Gastrointestinal: Negative.  Negative for abdominal pain and nausea.  Genitourinary: Negative.   Musculoskeletal: Negative for falls.  Neurological: Positive for tremors. Negative for weakness.       Patient has Parkinson's Disease  Psychiatric/Behavioral: Negative for depression, hallucinations and memory loss. The patient is not nervous/anxious and does not have insomnia.     Physical Exam  Constitutional: She is oriented to person, place, and time. She appears well-developed and well-nourished.  Cardiovascular: Normal rate, regular rhythm, normal heart sounds and intact distal pulses.   Respiratory: Effort normal and breath sounds normal. No respiratory distress. She has no wheezes. She has no rales.  See ROS; patient has diminished breath sounds throughout A/L/P lung fields as baseline  GI: Soft. Bowel sounds are  normal.  Musculoskeletal: She exhibits edema.  See ROS  Neurological: She is alert and oriented to person, place, and time.  Skin: Skin is warm and dry. There is erythema.  See ROS re: patient LE swelling; erythema on bilateral legs is greatly improved from previous [place] in-home visits, however, (L) LE slightly erythematous and swollen > (R) LE  Psychiatric: She has a normal mood and affect. Her behavior is normal. Judgment and thought content normal.    Encounter Medications:   Outpatient Encounter Prescriptions as of 05/16/2016  Medication Sig Note  . carbidopa-levodopa (SINEMET IR) 25-100 MG tablet Take 1 at 6a, 1 1/2 pills at 9a, 1 at noon, 1 1/2 pills at 3p, 1 at 6pm, 1 1/2 pills at 9pm   . Cholecalciferol (VITAMIN D) 2000 units CAPS Take by mouth daily. Reported on 04/10/2016   . clindamycin (CLEOCIN) 300 MG capsule Take 1 capsule (300 mg total) by mouth 3 (three) times daily. (Patient not taking: Reported on 04/02/2016) 04/02/2016: Patient reports completed doses; verified empty bottle  . docusate sodium (COLACE) 100 MG capsule Take 100 mg by mouth daily. Reported on 02/28/2016   . ferrous sulfate 325 (65  FE) MG EC tablet Take 1 tablet (325 mg total) by mouth daily.   . furosemide (LASIX) 40 MG tablet Take 1 tablet (40 mg total) by mouth daily.   Marland Kitchen latanoprost (XALATAN) 0.005 % ophthalmic solution Place 1 drop into the left eye daily at 6 PM.    . LORazepam (ATIVAN) 0.5 MG tablet Take 1 tablet (0.5 mg total) by mouth 2 (two) times daily as needed for anxiety. 04/11/2016: Reports she has not used in the past 7 days  . Melatonin 5 MG TABS Take one tablet by mouth one hour before bedtime for sleep   . Multiple Vitamins-Minerals (MULTIVITAMIN WITH MINERALS) tablet Take 1 tablet by mouth daily.   Marland Kitchen oxyCODONE (OXY IR/ROXICODONE) 5 MG immediate release tablet Take 1 tablet (5 mg total) by mouth 2 (two) times daily as needed for severe pain. 04/10/2016: States she has not used in the past 7  days.    Vladimir Faster Glycol-Propyl Glycol (SYSTANE OP) Place 1 drop into the right eye daily at 6 PM.   . pramipexole (MIRAPEX) 1 MG tablet Take 1 tablet (1 mg total) by mouth 3 (three) times daily.    No facility-administered encounter medications on file as of 05/16/2016.     Functional Status:   In your present state of health, do you have any difficulty performing the following activities: 03/11/2016 02/16/2016  Hearing? N -  Vision? N -  Difficulty concentrating or making decisions? N -  Walking or climbing stairs? Y -  Dressing or bathing? Y -  Doing errands, shopping? Y -  Conservation officer, nature and eating ? - Y  Using the Toilet? - N  In the past six months, have you accidently leaked urine? - N  Do you have problems with loss of bowel control? - N  Managing your Medications? - N  Managing your Finances? - N  Housekeeping or managing your Housekeeping? - N  Some recent data might be hidden    Fall/Depression Screening:    PHQ 2/9 Scores 05/14/2016 03/29/2016 03/19/2016 02/21/2016 01/30/2016 01/26/2016 01/16/2016  PHQ - 2 Score 0 0 0 0 0 0 0    Assessment:  Safety issues with Kal[place]'s medications that were identified during initial [place] in-home visit continue to appear to have improved, and Tracia reports compliance/ adherence with her medications.  Mackynzie's lower legs show significant improvement since initial [place] home visit.  Neda appears clean and happy, and her living environment is much less cluttered.  Asheley has almost met all of her previously established [facility] CM goals.  Plan:   Ms. Silverio will continue keepingall of her medications in one centralized box, and will take her medications as prescribed.   Ms. Polkowski will attend her scheduled provider appointments.  Ms. Winward will continue actively working with [facility] CSW.  Ms. Therrien will notify her providers for any concerns, issues, problems, or questions that arise.  Continued [facility] CM outreach  for routine home visit, most probably for patient discharge from [place], scheduled for next month.   [name], RN, BSN, [employer] [facility] Care Management  [phone]

## 2016-05-17 ENCOUNTER — Ambulatory Visit: Payer: Self-pay | Admitting: Licensed Clinical Social Worker

## 2016-05-24 ENCOUNTER — Ambulatory Visit: Payer: Medicare Other | Admitting: Internal Medicine

## 2016-05-24 ENCOUNTER — Other Ambulatory Visit: Payer: Self-pay | Admitting: Licensed Clinical Social Worker

## 2016-05-24 NOTE — Patient Outreach (Signed)
[facility]) Care Management  05/24/2016  [name] 12/09/1940 [id number]   Assessment- CSW completed outreach to patient. Patient answered. She reports that she is "feeling fine." She states that she continues to receive help from her caregiver. Patient's mood seems to be elevated. CSW appraised her recent improvements with cleaning her home, taking care of her herself both mentally and physically and also gaining a new caregiver. CSW informed patient that she will be out of the office starting 06/03/16 and will return 06/12/16. Patient expressed understanding and is agreeable to home visit next month.   Plan-CSW will complete next outreach within 30 days.  [name], BSW, MSW, [email or web address] Phone: [phone] Fax: [phone]

## 2016-05-25 ENCOUNTER — Encounter (HOSPITAL_COMMUNITY): Payer: Self-pay | Admitting: Emergency Medicine

## 2016-05-25 ENCOUNTER — Emergency Department (HOSPITAL_COMMUNITY): Payer: Medicare Other

## 2016-05-25 ENCOUNTER — Inpatient Hospital Stay (HOSPITAL_COMMUNITY)
Admission: EM | Admit: 2016-05-25 | Discharge: 2016-06-01 | DRG: 291 | Disposition: A | Discharge status: Home Health | Payer: Medicare Other | Attending: Internal Medicine | Admitting: Internal Medicine

## 2016-05-25 DIAGNOSIS — Z9114 Patient's other noncompliance with medication regimen: Secondary | ICD-10-CM | POA: Diagnosis not present

## 2016-05-25 DIAGNOSIS — F419 Anxiety disorder, unspecified: Secondary | ICD-10-CM | POA: Diagnosis present

## 2016-05-25 DIAGNOSIS — J42 Unspecified chronic bronchitis: Secondary | ICD-10-CM | POA: Diagnosis present

## 2016-05-25 DIAGNOSIS — I110 Hypertensive heart disease with heart failure: Principal | ICD-10-CM | POA: Diagnosis present

## 2016-05-25 DIAGNOSIS — I5043 Acute on chronic combined systolic (congestive) and diastolic (congestive) heart failure: Secondary | ICD-10-CM | POA: Diagnosis present

## 2016-05-25 DIAGNOSIS — I5022 Chronic systolic (congestive) heart failure: Secondary | ICD-10-CM

## 2016-05-25 DIAGNOSIS — E039 Hypothyroidism, unspecified: Secondary | ICD-10-CM | POA: Diagnosis present

## 2016-05-25 DIAGNOSIS — I10 Essential (primary) hypertension: Secondary | ICD-10-CM | POA: Diagnosis not present

## 2016-05-25 DIAGNOSIS — J189 Pneumonia, unspecified organism: Secondary | ICD-10-CM | POA: Diagnosis not present

## 2016-05-25 DIAGNOSIS — Z885 Allergy status to narcotic agent status: Secondary | ICD-10-CM | POA: Diagnosis not present

## 2016-05-25 DIAGNOSIS — K5900 Constipation, unspecified: Secondary | ICD-10-CM | POA: Diagnosis not present

## 2016-05-25 DIAGNOSIS — Z79899 Other long term (current) drug therapy: Secondary | ICD-10-CM | POA: Diagnosis not present

## 2016-05-25 DIAGNOSIS — Z8673 Personal history of transient ischemic attack (TIA), and cerebral infarction without residual deficits: Secondary | ICD-10-CM

## 2016-05-25 DIAGNOSIS — I5033 Acute on chronic diastolic (congestive) heart failure: Secondary | ICD-10-CM

## 2016-05-25 DIAGNOSIS — R0602 Shortness of breath: Secondary | ICD-10-CM

## 2016-05-25 DIAGNOSIS — K219 Gastro-esophageal reflux disease without esophagitis: Secondary | ICD-10-CM | POA: Diagnosis present

## 2016-05-25 DIAGNOSIS — G20 Parkinson's disease: Secondary | ICD-10-CM | POA: Diagnosis present

## 2016-05-25 DIAGNOSIS — E7800 Pure hypercholesterolemia, unspecified: Secondary | ICD-10-CM | POA: Diagnosis present

## 2016-05-25 DIAGNOSIS — Z880 Allergy status to penicillin: Secondary | ICD-10-CM | POA: Diagnosis not present

## 2016-05-25 DIAGNOSIS — I5023 Acute on chronic systolic (congestive) heart failure: Secondary | ICD-10-CM | POA: Diagnosis not present

## 2016-05-25 DIAGNOSIS — I5021 Acute systolic (congestive) heart failure: Secondary | ICD-10-CM | POA: Diagnosis not present

## 2016-05-25 DIAGNOSIS — K5909 Other constipation: Secondary | ICD-10-CM | POA: Diagnosis present

## 2016-05-25 DIAGNOSIS — R627 Adult failure to thrive: Secondary | ICD-10-CM | POA: Diagnosis present

## 2016-05-25 DIAGNOSIS — I509 Heart failure, unspecified: Secondary | ICD-10-CM | POA: Diagnosis not present

## 2016-05-25 DIAGNOSIS — R0789 Other chest pain: Secondary | ICD-10-CM | POA: Diagnosis not present

## 2016-05-25 DIAGNOSIS — D649 Anemia, unspecified: Secondary | ICD-10-CM | POA: Diagnosis present

## 2016-05-25 DIAGNOSIS — Z8711 Personal history of peptic ulcer disease: Secondary | ICD-10-CM | POA: Diagnosis not present

## 2016-05-25 DIAGNOSIS — R079 Chest pain, unspecified: Secondary | ICD-10-CM | POA: Diagnosis not present

## 2016-05-25 DIAGNOSIS — Z8249 Family history of ischemic heart disease and other diseases of the circulatory system: Secondary | ICD-10-CM | POA: Diagnosis not present

## 2016-05-25 DIAGNOSIS — R55 Syncope and collapse: Secondary | ICD-10-CM | POA: Diagnosis not present

## 2016-05-25 DIAGNOSIS — H409 Unspecified glaucoma: Secondary | ICD-10-CM | POA: Diagnosis present

## 2016-05-25 HISTORY — DX: Pneumonia, unspecified organism: J18.9

## 2016-05-25 LAB — I-STAT TROPONIN, ED: Troponin i, poc: 0 ng/mL (ref 0.00–0.08)

## 2016-05-25 LAB — CBC
HCT: 30 % — ABNORMAL LOW (ref 36.0–46.0)
Hemoglobin: 9.1 g/dL — ABNORMAL LOW (ref 12.0–15.0)
MCH: 32.5 pg (ref 26.0–34.0)
MCHC: 30.3 g/dL (ref 30.0–36.0)
MCV: 107.1 fL — ABNORMAL HIGH (ref 78.0–100.0)
PLATELETS: 182 10*3/uL (ref 150–400)
RBC: 2.8 MIL/uL — ABNORMAL LOW (ref 3.87–5.11)
RDW: 16.8 % — AB (ref 11.5–15.5)
WBC: 7.4 10*3/uL (ref 4.0–10.5)

## 2016-05-25 LAB — BRAIN NATRIURETIC PEPTIDE: B NATRIURETIC PEPTIDE 5: 294 pg/mL — AB (ref 0.0–100.0)

## 2016-05-25 LAB — BASIC METABOLIC PANEL
Anion gap: 7 (ref 5–15)
BUN: 16 mg/dL (ref 6–20)
CALCIUM: 9.1 mg/dL (ref 8.9–10.3)
CO2: 25 mmol/L (ref 22–32)
CREATININE: 0.76 mg/dL (ref 0.44–1.00)
Chloride: 107 mmol/L (ref 101–111)
GFR calc Af Amer: >60 mL/min (ref >60)
Glucose, Bld: 93 mg/dL (ref 65–99)
Potassium: 4 mmol/L (ref 3.5–5.1)
SODIUM: 139 mmol/L (ref 135–145)

## 2016-05-25 LAB — TROPONIN I
Troponin I: <0.03 ng/mL (ref <0.03)
Troponin I: <0.03 ng/mL (ref <0.03)

## 2016-05-25 LAB — TSH: TSH: 0.762 u[IU]/mL (ref 0.350–4.500)

## 2016-05-25 MED ORDER — PRAMIPEXOLE DIHYDROCHLORIDE 1 MG PO TABS
1.0000 mg | ORAL_TABLET | Freq: Three times a day (TID) | ORAL | Status: DC
  Administered 2016-05-25 – 2016-06-01 (×21): 1 mg via ORAL
  Filled 2016-05-25 (×25): qty 1

## 2016-05-25 MED ORDER — FERROUS SULFATE 325 (65 FE) MG PO TABS
325.0000 mg | ORAL_TABLET | Freq: Every day | ORAL | Status: DC
  Administered 2016-05-25 – 2016-06-01 (×8): 325 mg via ORAL
  Filled 2016-05-25 (×8): qty 1

## 2016-05-25 MED ORDER — IPRATROPIUM-ALBUTEROL 0.5-2.5 (3) MG/3ML IN SOLN
3.0000 mL | Freq: Three times a day (TID) | RESPIRATORY_TRACT | Status: DC
  Administered 2016-05-26: 3 mL via RESPIRATORY_TRACT
  Filled 2016-05-25: qty 3

## 2016-05-25 MED ORDER — SENNA 8.6 MG PO TABS
1.0000 | ORAL_TABLET | Freq: Two times a day (BID) | ORAL | Status: DC
  Administered 2016-05-25 – 2016-06-01 (×14): 8.6 mg via ORAL
  Filled 2016-05-25 (×15): qty 1

## 2016-05-25 MED ORDER — LEVOFLOXACIN IN D5W 750 MG/150ML IV SOLN
750.0000 mg | Freq: Once | INTRAVENOUS | Status: AC
  Administered 2016-05-25: 750 mg via INTRAVENOUS
  Filled 2016-05-25: qty 150

## 2016-05-25 MED ORDER — ACETAMINOPHEN 325 MG PO TABS
650.0000 mg | ORAL_TABLET | Freq: Four times a day (QID) | ORAL | Status: DC | PRN
  Administered 2016-05-25 – 2016-05-30 (×4): 650 mg via ORAL
  Filled 2016-05-25 (×4): qty 2

## 2016-05-25 MED ORDER — VITAMIN D 1000 UNITS PO TABS
2000.0000 [IU] | ORAL_TABLET | Freq: Every day | ORAL | Status: DC
Start: 2016-05-25 — End: 2016-06-01
  Administered 2016-05-25 – 2016-06-01 (×8): 2000 [IU] via ORAL
  Filled 2016-05-25 (×8): qty 2

## 2016-05-25 MED ORDER — GUAIFENESIN ER 600 MG PO TB12
600.0000 mg | ORAL_TABLET | Freq: Two times a day (BID) | ORAL | Status: DC
  Administered 2016-05-25 – 2016-06-01 (×15): 600 mg via ORAL
  Filled 2016-05-25 (×15): qty 1

## 2016-05-25 MED ORDER — LORAZEPAM 0.5 MG PO TABS: 0.5000 mg | ORAL_TABLET | Freq: Two times a day (BID) | ORAL | Status: DC | PRN

## 2016-05-25 MED ORDER — LATANOPROST 0.005 % OP SOLN
1.0000 [drp] | Freq: Every day | OPHTHALMIC | Status: DC
Start: 2016-05-25 — End: 2016-06-01
  Administered 2016-05-25 – 2016-05-31 (×7): 1 [drp] via OPHTHALMIC
  Filled 2016-05-25: qty 2.5

## 2016-05-25 MED ORDER — ALBUTEROL SULFATE (2.5 MG/3ML) 0.083% IN NEBU: 2.5000 mg | INHALATION_SOLUTION | RESPIRATORY_TRACT | Status: DC | PRN

## 2016-05-25 MED ORDER — IPRATROPIUM-ALBUTEROL 0.5-2.5 (3) MG/3ML IN SOLN
3.0000 mL | Freq: Four times a day (QID) | RESPIRATORY_TRACT | Status: DC
  Administered 2016-05-25 (×2): 3 mL via RESPIRATORY_TRACT
  Filled 2016-05-25 (×2): qty 3

## 2016-05-25 MED ORDER — HEPARIN SODIUM (PORCINE) 5000 UNIT/ML IJ SOLN
5000.0000 [IU] | Freq: Three times a day (TID) | INTRAMUSCULAR | Status: DC
  Administered 2016-05-25 – 2016-06-01 (×20): 5000 [IU] via SUBCUTANEOUS
  Filled 2016-05-25 (×20): qty 1

## 2016-05-25 MED ORDER — OXYCODONE HCL 5 MG PO TABS: 5.0000 mg | ORAL_TABLET | Freq: Two times a day (BID) | ORAL | Status: DC | PRN

## 2016-05-25 MED ORDER — FUROSEMIDE 10 MG/ML IJ SOLN
40.0000 mg | Freq: Once | INTRAMUSCULAR | Status: AC
  Administered 2016-05-25: 40 mg via INTRAVENOUS
  Filled 2016-05-25: qty 4

## 2016-05-25 MED ORDER — ACETAMINOPHEN 650 MG RE SUPP: 650.0000 mg | Freq: Four times a day (QID) | RECTAL | Status: DC | PRN

## 2016-05-25 MED ORDER — ADULT MULTIVITAMIN W/MINERALS CH
1.0000 | ORAL_TABLET | Freq: Every day | ORAL | Status: DC
  Administered 2016-05-25 – 2016-06-01 (×8): 1 via ORAL
  Filled 2016-05-25 (×9): qty 1

## 2016-05-25 MED ORDER — SODIUM CHLORIDE 0.9% FLUSH
3.0000 mL | Freq: Two times a day (BID) | INTRAVENOUS | Status: DC
  Administered 2016-05-25 – 2016-05-28 (×6): 3 mL via INTRAVENOUS

## 2016-05-25 MED ORDER — FUROSEMIDE 10 MG/ML IJ SOLN
40.0000 mg | Freq: Every day | INTRAMUSCULAR | Status: DC
  Administered 2016-05-26: 40 mg via INTRAVENOUS
  Filled 2016-05-25: qty 4

## 2016-05-25 MED ORDER — CARBIDOPA-LEVODOPA 25-100 MG PO TABS
1.0000 | ORAL_TABLET | ORAL | Status: DC
  Administered 2016-05-25 – 2016-06-01 (×21): 1 via ORAL
  Filled 2016-05-25 (×13): qty 1
  Filled 2016-05-25: qty 2
  Filled 2016-05-25 (×5): qty 1

## 2016-05-25 MED ORDER — ONDANSETRON HCL 4 MG PO TABS: 4.0000 mg | ORAL_TABLET | Freq: Four times a day (QID) | ORAL | Status: DC | PRN

## 2016-05-25 MED ORDER — ONDANSETRON HCL 4 MG/2ML IJ SOLN
4.0000 mg | Freq: Four times a day (QID) | INTRAMUSCULAR | Status: DC | PRN
  Administered 2016-05-31: 4 mg via INTRAVENOUS
  Filled 2016-05-25: qty 2

## 2016-05-25 MED ORDER — TRAZODONE HCL 50 MG PO TABS
50.0000 mg | ORAL_TABLET | Freq: Every evening | ORAL | Status: DC | PRN
  Administered 2016-05-27: 50 mg via ORAL
  Filled 2016-05-25: qty 1

## 2016-05-25 MED ORDER — CARBIDOPA-LEVODOPA ER 25-100 MG PO TBCR
1.5000 | EXTENDED_RELEASE_TABLET | Freq: Three times a day (TID) | ORAL | Status: DC
  Administered 2016-05-25: 1.5 via ORAL
  Filled 2016-05-25: qty 1.5

## 2016-05-25 MED ORDER — POLYETHYL GLYCOL-PROPYL GLYCOL 0.4-0.3 % OP GEL
Freq: Every day | OPHTHALMIC | Status: DC
  Filled 2016-05-25: qty 10

## 2016-05-25 MED ORDER — LEVOFLOXACIN IN D5W 750 MG/150ML IV SOLN
750.0000 mg | INTRAVENOUS | Status: DC
Start: 2016-05-26 — End: 2016-05-26

## 2016-05-25 MED ORDER — SODIUM CHLORIDE 0.9% FLUSH
3.0000 mL | Freq: Two times a day (BID) | INTRAVENOUS | Status: DC
Start: 2016-05-25 — End: 2016-05-28
  Administered 2016-05-25 – 2016-05-27 (×4): 3 mL via INTRAVENOUS

## 2016-05-25 MED ORDER — FUROSEMIDE 10 MG/ML IJ SOLN
20.0000 mg | Freq: Every evening | INTRAMUSCULAR | Status: DC
  Administered 2016-05-25: 20 mg via INTRAVENOUS
  Filled 2016-05-25: qty 2

## 2016-05-25 MED ORDER — SODIUM CHLORIDE 0.9 % IV SOLN: 250.0000 mL | INTRAVENOUS | Status: DC | PRN

## 2016-05-25 MED ORDER — SODIUM CHLORIDE 0.9% FLUSH: 3.0000 mL | INTRAVENOUS | Status: DC | PRN

## 2016-05-25 MED ORDER — ASPIRIN EC 81 MG PO TBEC
81.0000 mg | DELAYED_RELEASE_TABLET | Freq: Every day | ORAL | Status: DC
  Administered 2016-05-25 – 2016-06-01 (×8): 81 mg via ORAL
  Filled 2016-05-25 (×8): qty 1

## 2016-05-25 MED ORDER — CARBIDOPA-LEVODOPA 25-100 MG PO TABS
1.0000 | ORAL_TABLET | Freq: Three times a day (TID) | ORAL | Status: DC
  Filled 2016-05-25: qty 1

## 2016-05-25 MED ORDER — ARTIFICIAL TEARS OP OINT
TOPICAL_OINTMENT | Freq: Every day | OPHTHALMIC | Status: DC
  Administered 2016-05-25 – 2016-06-01 (×8): via OPHTHALMIC
  Filled 2016-05-25: qty 3.5

## 2016-05-25 MED ORDER — GI COCKTAIL ~~LOC~~
30.0000 mL | Freq: Once | ORAL | Status: AC
  Administered 2016-05-25: 30 mL via ORAL
  Filled 2016-05-25: qty 30

## 2016-05-25 MED ORDER — POLYETHYLENE GLYCOL 3350 17 G PO PACK
17.0000 g | PACK | Freq: Every day | ORAL | Status: DC
Start: 2016-05-25 — End: 2016-05-26
  Administered 2016-05-25: 17 g via ORAL
  Filled 2016-05-25: qty 1

## 2016-05-25 MED ORDER — CARBIDOPA-LEVODOPA 25-100 MG PO TABS
1.5000 | ORAL_TABLET | ORAL | Status: DC
  Administered 2016-05-26 – 2016-06-01 (×19): 1.5 via ORAL
  Filled 2016-05-25 (×2): qty 2
  Filled 2016-05-25: qty 1.5
  Filled 2016-05-25 (×11): qty 2
  Filled 2016-05-25: qty 1
  Filled 2016-05-25: qty 2
  Filled 2016-05-25: qty 1
  Filled 2016-05-25: qty 2
  Filled 2016-05-25: qty 1

## 2016-05-25 NOTE — ED Notes (Signed)
Patient transported to X-ray 

## 2016-05-25 NOTE — ED Provider Notes (Signed)
Medical screening examination/treatment/procedure(s) were conducted as a shared visit with non-physician practitioner(s) and myself.  I personally evaluated the patient during the encounter.   EKG Interpretation  Date/Time:  Saturday May 25 2016 05:54:05 EDT Ventricular Rate:  76 PR Interval:    QRS Duration: 92 QT Interval:  384 QTC Calculation: 432 R Axis:   4 Text Interpretation:  Sinus rhythm Baseline wander in lead(s) V6 Confirmed by DELO  MD, DOUGLAS ([id number]) on 05/25/2016 6:10:31 AM     75 year old female presents with increasing dyspnea as well as cough. Noncompliant with her CHF medications. Does have crackles on exam here. bnp elevated chest x-ray consistent with mild edema. Possible pneumonia. Will admit to the hospitalist   Lacretia Leigh, MD 05/25/16 [phone]

## 2016-05-25 NOTE — ED Triage Notes (Signed)
According to EMS, pt was already up around 3:30am and she began to experience substernal sharp pain that radiated to her mouth.  Unremarkable EKG, no hx of MI, hx of CHF, has not taken her lasix in 5 days, she did get winded trying to get to the stretcher.  Pain was initially a 8, was a 3 upon arrival.

## 2016-05-25 NOTE — Progress Notes (Signed)
This evening patient was having increase tremor more on patient neck and head which started  about an hour after she took carbidopa 1.5 tabs  Patient denies pain, shortness of breath.patient A&O x4. VSS, Pt. Stated this movement is not new she  Sometimes have is at home. MD notified, MD instruct to hold Carbidopa till am. Patient is calm now. Report given to night RN. Will continue to monitor patient.

## 2016-05-25 NOTE — H&P (Signed)
Patient Demographics:    [name], is a 75 y.o. female  MRN: [id number]   DOB - 11-Oct-1940  Admit Date - 05/25/2016  Outpatient Primary MD for the patient is REED, [name], DO   Assessment & Plan:    Active Problems:   CHF exacerbation (HCC)   Pneumonia    1)Acute Congestive Heart Failure Exacerbation- Due to noncompliance with Lasix, Pt now presents With shortness of breath and his chest discomfort, BNP is only 294, however clinically and radiologically patient has signs and symptoms of congestive heart failure. Check serial troponins and EKG for rule out ACS protocol, check echocardiogram to evaluate EF, valvular apparatus and to rule out wall motion abnormalities. Last known EF 55-60% based on echo cavagram from 2013. Patient may have Diastolic Dysfunction CHF.  IV Lasix as ordered , daily weights and fluid input and output monitoring. The need to be compliant with Lasix emphasized the patient.   2)Lt Sided PNA- treat apparently with IV Levaquin, bronchodilators, mucolytics and supplemental oxygen prn, no significant hypoxia at this time, no leukocytosis, patient does not meet sepsis criteria  3)Parkinson's Disease- resume Sinemet  4)Social/Ethics- Plan of care and CODE STATUS discussed with patient, patient desires to be full code at this time  With History of - Reviewed by me  Past Medical History:  Diagnosis Date  . Acute on chronic systolic heart failure ([place])   . Anemia   . Anxiety   . Arthritis   . Cataract left  . Cellulitis and abscess of leg, except foot   . Cellulitis of lower leg 12/24/2011  . CHF (congestive heart failure) ([place])   . Chronic bronchitis ([place])    "usually get it q yr"  . Chronic systolic heart failure ([place])   . GERD (gastroesophageal reflux disease)   . Glaucoma left  .  Helicobacter pylori (H. pylori)   . History of bleeding peptic ulcer   . History of blood transfusion    "don't remember why"  . Hypercholesterolemia   . Hypertension   . Hypotension, unspecified   . Hypothyroidism   . Parkinson's disease   . Sleep apnea    "had test years ago; insurance wouldn't pay for mask so I never had one" (04/27/2015)  . Stroke [facility]) X 3   "that  was the reason I got Parkinson's"      Past Surgical History:  Procedure Laterality Date  . BACK SURGERY    . ESOPHAGOGASTRODUODENOSCOPY  09/26/2011   Procedure: ESOPHAGOGASTRODUODENOSCOPY (EGD);  Surgeon: Zenovia Jarred, MD;  [facility] ENDOSCOPY;  Service: Gastroenterology;  Laterality: N/A;  To be done at bedside.  . ESOPHAGOGASTRODUODENOSCOPY N/A 09/30/2013   Procedure: ESOPHAGOGASTRODUODENOSCOPY (EGD);  Surgeon: [name], MD;  Location: [place];  Service: Endoscopy;  Laterality: N/A;  . ESOPHAGOGASTRODUODENOSCOPY N/A 11/25/2013   Procedure: ESOPHAGOGASTRODUODENOSCOPY (EGD);  Surgeon: [name], MD;  Location: [facility] ENDOSCOPY;  Service: Endoscopy;  Laterality: N/A;  .  HEMIARTHROPLASTY HIP Right 09/24/2007   Archie Endo 01/23/2011  . KYPHOPLASTY  06/2009   T12/notes 07/17/2009  . NISSEN FUNDOPLICATION     "had OR for GERD"  . TENDON REPAIR     Gluteus medius Archie Endo 01/23/2011  . THYROIDECTOMY    . TONSILLECTOMY        Chief Complaint  Patient presents with  . Chest Pain      HPI:    [name]  is a 75 y.o. female, With past medical history relevant for Parkinson's disease and CHF who presents with cough and shortness of breath for almost 3 days now. Patient apparently quit taking Lasix couple days ago because she did not want to have to walk to the bathroom. Cough is with white to slightly yellow sputum, shortness of breath worsened over the last 12 hours, no significant leg swelling no pleuritic symptoms. Patient complains of slight chest tightness. No fevers or chills. She does have some disorganization,  no orthopnea or paroxysmal nocturnal dyspnea. Patient has a caregiver that checks on her daily. No vomiting or diarrhea in fact no BM in the last 6 days (she has chronic constipation)    Review of systems:    In addition to the HPI above,   A full 12 point Review of Systems was done, except as stated above, all other Review of Systems were negative.    Social History:  Reviewed by me    Social History  Substance Use Topics  . Smoking status: Never Smoker  . Smokeless tobacco: Never Used  . Alcohol use No       Family History :  Reviewed by me    Family History  Problem Relation Age of Onset  . GER disease Mother   . Heart disease Father      Home Medications:   Prior to Admission medications   Medication Sig Start Date End Date Taking? Authorizing Provider  carbidopa-levodopa (SINEMET IR) 25-100 MG tablet Take 1 at 6a, 1 1/2 pills at [place], 1 at noon, 1 1/2 pills at 3p, 1 at 6pm, 1 1/2 pills at 9pm 01/15/16  Yes Star Age, MD  Cholecalciferol (VITAMIN D) 2000 units CAPS Take 2,000 Units by mouth daily. Reported on 04/10/2016   Yes Historical Provider, MD  docusate sodium (COLACE) 100 MG capsule Take 100 mg by mouth daily. Reported on 02/28/2016   Yes Historical Provider, MD  ferrous sulfate 325 (65 FE) MG EC tablet Take 1 tablet (325 mg total) by mouth daily. 02/22/16  Yes [name] L Reed, DO  furosemide (LASIX) 40 MG tablet Take 1 tablet (40 mg total) by mouth daily. 03/29/16  Yes [name] L Reed, DO  latanoprost (XALATAN) 0.005 % ophthalmic solution Place 1 drop into the left eye daily at 6 PM.  02/23/15  Yes Historical Provider, MD  LORazepam (ATIVAN) 0.5 MG tablet Take 1 tablet (0.5 mg total) by mouth 2 (two) times daily as needed for anxiety. 12/29/15  Yes [name] L Reed, DO  Multiple Vitamins-Minerals (MULTIVITAMIN WITH MINERALS) tablet Take 1 tablet by mouth daily.   Yes Historical Provider, MD  oxyCODONE (OXY IR/ROXICODONE) 5 MG immediate release tablet Take 1 tablet (5 mg  total) by mouth 2 (two) times daily as needed for severe pain. 12/29/15  Yes [name] L Reed, DO  Polyethyl Glycol-Propyl Glycol (SYSTANE OP) Place 1 drop into both eyes daily.    Yes Historical Provider, MD  pramipexole (MIRAPEX) 1 MG tablet Take 1 tablet (1 mg total) by mouth 3 (  three) times daily. 10/10/15  Yes Star Age, MD     Allergies:     Allergies  Allergen Reactions  . Codeine Other (See Comments)    Unknown allergic reaction  . Penicillins Other (See Comments)    Pt passed out in doctor's office after penicillin dose     Physical Exam:   Vitals  Blood pressure 131/56, pulse 78, temperature 97.6 F (36.4 C), temperature source Oral, resp. rate 20, height 5\' 4"  (1.626 m), weight 77.1 kg (170 lb), SpO2 97 %.  Physical Examination: General appearance - alert, well appearing, and in no distress  Mental status - alert, oriented to person, place, and time,  Eyes - sclera anicteric Neck - supple, no JVD elevation , Chest - Diminished in bases, left more than right, few scattered rhonchi, bibasilar rales Heart - S1 and S2 normal,  Abdomen - soft, nontender, nondistended, no masses or organomegaly Neurological - screening mental status exam normal, neck supple without rigidity,  parkisonian tremors, no different from baseline Extremities - no pedal edema noted, intact peripheral pulses  Skin - warm, dry    Data Review:    CBC  Recent Labs Lab 05/25/16 0635  WBC 7.4  HGB 9.1*  HCT 30.0*  PLT 182  MCV 107.1*  MCH 32.5  MCHC 30.3  RDW 16.8*   ------------------------------------------------------------------------------------------------------------------  Chemistries   Recent Labs Lab 05/25/16 0635  NA 139  K 4.0  CL 107  CO2 25  GLUCOSE 93  BUN 16  CREATININE 0.76  CALCIUM 9.1   ------------------------------------------------------------------------------------------------------------------ estimated creatinine clearance is 61.1 mL/min (by C-G  formula based on SCr of 0.8 mg/dL). ------------------------------------------------------------------------------------------------------------------ No results for input(s): TSH, T4TOTAL, T3FREE, THYROIDAB in the last 72 hours.  Invalid input(s): FREET3   Coagulation profile No results for input(s): INR, PROTIME in the last 168 hours. ------------------------------------------------------------------------------------------------------------------- No results for input(s): DDIMER in the last 72 hours. -------------------------------------------------------------------------------------------------------------------  Cardiac Enzymes No results for input(s): CKMB, TROPONINI, MYOGLOBIN in the last 168 hours.  Invalid input(s): CK ------------------------------------------------------------------------------------------------------------------    Component Value Date/Time   BNP 294.0 (H) 05/25/2016 0635     ---------------------------------------------------------------------------------------------------------------  Urinalysis    Component Value Date/Time   COLORURINE YELLOW 04/27/2015 1416   APPEARANCEUR CLOUDY (A) 04/27/2015 1416   LABSPEC 1.024 04/27/2015 1416   PHURINE 6.0 04/27/2015 1416   GLUCOSEU NEGATIVE 04/27/2015 1416   HGBUR NEGATIVE 04/27/2015 1416   BILIRUBINUR NEGATIVE 04/27/2015 1416   KETONESUR 15 (A) 04/27/2015 1416   PROTEINUR NEGATIVE 04/27/2015 1416   UROBILINOGEN 0.2 04/27/2015 1416   NITRITE NEGATIVE 04/27/2015 1416   LEUKOCYTESUR LARGE (A) 04/27/2015 1416    ----------------------------------------------------------------------------------------------------------------   Imaging Results:    Dg Chest 2 View  Result Date: 05/25/2016 CLINICAL DATA:  Central chest pain since this morning EXAM: CHEST  2 VIEW COMPARISON:  August 30, 2015 FINDINGS: The heart size is enlarged. Mediastinal contour is normal. There is increased pulmonary interstitium  bilaterally. Patchy opacity is identified in the left lung base. There is no pleural effusion. There is a hiatal hernia. The bony structures are stable. IMPRESSION: Cardiomegaly.  Interstitial edema. Patchy consolidation of left lung base pneumonia is not excluded. Electronically Signed   By: Abelardo Diesel M.D.   On: 05/25/2016 07:35    Radiological Exams on Admission: Dg Chest 2 View  Result Date: 05/25/2016 CLINICAL DATA:  Central chest pain since this morning EXAM: CHEST  2 VIEW COMPARISON:  August 30, 2015 FINDINGS: The heart size is enlarged. Mediastinal contour is  normal. There is increased pulmonary interstitium bilaterally. Patchy opacity is identified in the left lung base. There is no pleural effusion. There is a hiatal hernia. The bony structures are stable. IMPRESSION: Cardiomegaly.  Interstitial edema. Patchy consolidation of left lung base pneumonia is not excluded. Electronically Signed   By: Abelardo Diesel M.D.   On: 05/25/2016 07:35    DVT Prophylaxis Heparin  AM Labs Ordered, also please review Full Orders  Family Communication: Admission, patients condition and plan of care including tests being ordered have been discussed with the patient who indicate understanding and agree with the plan   Code Status - Full Code  Likely DC to  Home   Condition   stable  Alyshia Kernan M.D on 05/25/2016 at 9:55 AM   Between 7am to 7pm - Pager - [phone]  After 7pm go to [email or web address] - password [email or web address]  [facility] - Office  [phone]  Dragon dictation system was used to create this note, attempts have been made to correct errors, however presence of uncorrected errors is not a reflection quality of care provided.

## 2016-05-25 NOTE — ED Provider Notes (Signed)
[place] DEPT Provider Note   CSN: [id number] Arrival date & time: 05/25/16  [id number]     History   Chief Complaint Chief Complaint  Patient presents with  . Chest Pain    HPI [name] is a 75 y.o. female.  HPI   75 year old female with history of hypertension, CHF, GERD, peptic ulcer, and hypercholesterolemia presenting via EMS with complaints of chest pain and increase dyspnea. Patient woke up at 3am this morning, her usual time. However, she noticed pain to her chest along the sternal region that radiates to her throat. She described pain as a sharp sensation initially intense but has since improved to 4 out of 10. Pain felt different from her usual GERD. Pain is not associated with lightheadedness, dizziness, nausea, diaphoresis. She has chronic shortness of breath which has mildy worsened. She also endorsed having mid to upper back pain this morning but attributed to the back pain that she had several weeks ago when she was lifting a chair. She denies any specific treatment tried. She contacted EMS and was brought here. Patient denies any prior MI. No fever, chills,  hemoptysis, abdominal pain, focal numbness or weakness. Patient lives at home by herself, she has an aide that visit 3 times a week. She uses a walker to walk. Recently patient states she having been taking her Lasix because she does not want to have to go to the bathroom so frequently. States she hasn't taken Lasix for the past 2 days. Patient denies any history of AAA. Patient also has history of Parkinson's disease and did not take it this morning. Pt report she has difficulty talking or moving when she doesn't take her Parkinson's medication.  Pt endorse a cough productive with phlegm for most of the year, usually worsen after eating.  Past Medical History:  Diagnosis Date  . Acute on chronic systolic heart failure ([place])   . Anemia   . Anxiety   . Arthritis   . Cataract left  . Cellulitis and abscess of leg,  except foot   . Cellulitis of lower leg 12/24/2011  . CHF (congestive heart failure) ([place])   . Chronic bronchitis ([place])    "usually get it q yr"  . Chronic systolic heart failure ([place])   . GERD (gastroesophageal reflux disease)   . Glaucoma left  . Helicobacter pylori (H. pylori)   . History of bleeding peptic ulcer   . History of blood transfusion    "don't remember why"  . Hypercholesterolemia   . Hypertension   . Hypotension, unspecified   . Hypothyroidism   . Parkinson's disease   . Sleep apnea    "had test years ago; insurance wouldn't pay for mask so I never had one" (04/27/2015)  . Stroke [facility]) X 3   "that  was the reason I got Parkinson's"    Patient Active Problem List   Diagnosis Date Noted  . Bilateral cellulitis of lower leg 03/11/2016  . Venous stasis dermatitis of both lower extremities 03/11/2016  . Melena 02/15/2016  . Right lower quadrant abdominal swelling, mass and lump 02/15/2016  . Acute delirium 04/27/2015  . Acute encephalopathy 04/27/2015  . UTI (urinary tract infection): Probable 04/27/2015  . Debility   . Dyskinesia 04/24/2015  . Cellulitis of left leg 04/23/2015  . Failure to thrive in adult 04/23/2015  . Chronic low back pain 01/18/2015  . Lower extremity pain 12/07/2014  . Cellulitis of left lower extremity   . Nausea vomiting and diarrhea 08/07/2014  .  Enteritis 08/07/2014  . 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 Chronic constipation 02/11/2014  . Primary freezing of gait 02/11/2014  . Abnormality of gait 01/28/2014  . Physical deconditioning 10/05/2013  . Thrombocytopenia, unspecified ([place]) 09/30/2013  . Rectal bleeding 09/29/2013  . UGI bleed 09/29/2013  . Anemia due to blood loss, acute 09/29/2013  . Anemia, iron deficiency 04/09/2013  . Cameron lesion, acute 01/08/2012  . Acute urinary retention, resolved after foley and no re-occurrence, 12/11/11  12/24/2011  . Cellulitis of lower leg 12/24/2011  .  Diastolic dysfunction, grade 2 12/15/2011  . CAP (community acquired pneumonia)  12/15/2011  . Abnormal EKG,(TWI lead 3 on one of 7 EKGs this admission) 12/12/2011  . Chest pain, MI R/O, negative MI- secondary to CAP 12/10/11 12/10/2011  . Cor pulmonale ([place]) 12/10/2011  . Syncope and collapse, pt fell and did not know why she fell. 12/10/2011  . Hematemesis 09/26/2011  . Acute blood loss anemia 09/26/2011  . Hypotension 09/26/2011  . Leucocytosis 09/26/2011  . Aspiration of blood 09/26/2011  . Parkinson's disease ([place]) 09/26/2011  . Essential hypertension 09/26/2011  . GI bleed: January 2013, Hgb stable, stool negative 09/26/2011  . Gastric ulcer with hemorrhage 09/26/2011  . Esophagitis, erosive 09/26/2011    Past Surgical History:  Procedure Laterality Date  . BACK SURGERY    . ESOPHAGOGASTRODUODENOSCOPY  09/26/2011   Procedure: ESOPHAGOGASTRODUODENOSCOPY (EGD);  Surgeon: Zenovia Jarred, MD;  [facility] END[facility]COPY;  Service: Gastroenterology;  Laterality: N/A;  To be done at bedside.  . ESOPHAGOGASTRODUODENOSCOPY N/A 09/30/2013   Procedure: ESOPHAGOGASTRODUODENOSCOPY (EGD);  Surgeon: [name], MD;  Location: [place];  Service: Endoscopy;  Laterality: N/A;  . ESOPHAGOGASTRODUODENOSCOPY N/A 11/25/2013   Procedure: ESOPHAGOGASTRODUODENOSCOPY (EGD);  Surgeon: [name], MD;  Location: [facility];  Service: Endoscopy;  Laterality: N/A;  . HEMIARTHROPLASTY HIP Right 09/24/2007   Archie Endo 01/23/2011  . KYPHOPLASTY  06/2009   T12/notes 07/17/2009  . NISSEN FUNDOPLICATION     "had OR for GERD"  . TENDON REPAIR     Gluteus medius Archie Endo 01/23/2011  . THYROIDECTOMY    . TONSILLECTOMY      OB History    No data available       Home Medications    Prior to Admission medications   Medication Sig Start Date End Date Taking? Authorizing Provider  carbidopa-levodopa (SINEMET IR) 25-100 MG tablet Take 1 at 6a, 1 1/2 pills at [place], 1 at noon, 1 1/2 pills at 3p, 1 at 6pm, 1 1/2 pills  at 9pm 01/15/16   Star Age, MD  Cholecalciferol (VITAMIN D) 2000 units CAPS Take by mouth daily. Reported on 04/10/2016    Historical Provider, MD  clindamycin (CLEOCIN) 300 MG capsule Take 1 capsule (300 mg total) by mouth 3 (three) times daily. Patient not taking: Reported on 04/02/2016 03/15/16   Clanford Marisa Hua, MD  docusate sodium (COLACE) 100 MG capsule Take 100 mg by mouth daily. Reported on 02/28/2016    Historical Provider, MD  ferrous sulfate 325 (65 FE) MG EC tablet Take 1 tablet (325 mg total) by mouth daily. 02/22/16   [name] L Reed, DO  furosemide (LASIX) 40 MG tablet Take 1 tablet (40 mg total) by mouth daily. 03/29/16   [name] L Reed, DO  latanoprost (XALATAN) 0.005 % ophthalmic solution Place 1 drop into the left eye daily at 6 PM.  02/23/15   Historical Provider, MD  LORazepam (ATIVAN) 0.5 MG tablet Take 1 tablet (0.5 mg  total) by mouth 2 (two) times daily as needed for anxiety. 12/29/15   [name] L Reed, DO  Melatonin 5 MG TABS Take one tablet by mouth one hour before bedtime for sleep 08/10/15   Gayland Curry, DO  Multiple Vitamins-Minerals (MULTIVITAMIN WITH MINERALS) tablet Take 1 tablet by mouth daily.    Historical Provider, MD  oxyCODONE (OXY IR/ROXICODONE) 5 MG immediate release tablet Take 1 tablet (5 mg total) by mouth 2 (two) times daily as needed for severe pain. 12/29/15   [name] L Reed, DO  Polyethyl Glycol-Propyl Glycol (SYSTANE OP) Place 1 drop into the right eye daily at 6 PM.    Historical Provider, MD  pramipexole (MIRAPEX) 1 MG tablet Take 1 tablet (1 mg total) by mouth 3 (three) times daily. 10/10/15   Star Age, MD    Family History Family History  Problem Relation Age of Onset  . GER disease Mother   . Heart disease Father     Social History Social History  Substance Use Topics  . Smoking status: Never Smoker  . Smokeless tobacco: Never Used  . Alcohol use No     Allergies   Codeine and Penicillins   Review of Systems Review of Systems  All  other systems reviewed and are negative.    Physical Exam Updated Vital Signs BP 164/84 (BP Location: Right Arm)   Pulse 78   Temp 97.6 F (36.4 C) (Oral)   Resp 19   Ht 5\' 4"  (1.626 m)   Wt 77.1 kg   SpO2 98%   BMI 29.18 kg/m   Physical Exam  Constitutional: No distress.  Chronically ill appearing female laying in bed with eyes closed. Dysarthric speech, baseline according to her  HENT:  Head: Atraumatic.  Eyes: Conjunctivae are normal.  Neck: Neck supple. No JVD present.  Cardiovascular: Normal rate, regular rhythm and intact distal pulses.   Pulmonary/Chest: She exhibits tenderness (Mild tenderness to the palpation of the anterior chest without any crepitus or emphysema.).  Breathing with poor effort, rales heard at the base of lungs  Abdominal: Soft. There is no tenderness.  Neurological: She is alert.  Skin: Capillary refill takes less than 2 seconds. No rash noted.  Erythematous skin changes to bilateral extremities consistence with chronic venous stasis  Psychiatric: She has a normal mood and affect.  Nursing note and vitals reviewed.    ED Treatments / Results  Labs (all labs ordered are listed, but only abnormal results are displayed) Labs Reviewed  CBC - Abnormal; Notable for the following:       Result Value   RBC 2.80 (*)    Hemoglobin 9.1 (*)    HCT 30.0 (*)    MCV 107.1 (*)    RDW 16.8 (*)    All other components within normal limits  BRAIN NATRIURETIC PEPTIDE - Abnormal; Notable for the following:    B Natriuretic Peptide 294.0 (*)    All other components within normal limits  BASIC METABOLIC PANEL  I-STAT TROPOININ, ED    EKG  EKG Interpretation  Date/Time:  Saturday May 25 2016 05:54:05 EDT Ventricular Rate:  76 PR Interval:    QRS Duration: 92 QT Interval:  384 QTC Calculation: 432 R Axis:   4 Text Interpretation:  Sinus rhythm Baseline wander in lead(s) V6 Confirmed by DELO  MD, DOUGLAS ([id number]) on 05/25/2016 6:10:06 AM        Radiology Dg Chest 2 View  Result Date: 05/25/2016 CLINICAL DATA:  Central chest pain  since this morning EXAM: CHEST  2 VIEW COMPARISON:  August 30, 2015 FINDINGS: The heart size is enlarged. Mediastinal contour is normal. There is increased pulmonary interstitium bilaterally. Patchy opacity is identified in the left lung base. There is no pleural effusion. There is a hiatal hernia. The bony structures are stable. IMPRESSION: Cardiomegaly.  Interstitial edema. Patchy consolidation of left lung base pneumonia is not excluded. Electronically Signed   By: Abelardo Diesel M.D.   On: 05/25/2016 07:35    Procedures Procedures (including critical care time)  Medications Ordered in ED Medications - No data to display   Initial Impression / Assessment and Plan / ED Course  I have reviewed the triage vital signs and the nursing notes.  Pertinent labs & imaging results that were available during my care of the patient were reviewed by me and considered in my medical decision making (see chart for details).  Clinical Course    BP 131/56 (BP Location: Right Arm)   Pulse 78   Temp 97.6 F (36.4 C) (Oral)   Resp 20   Ht 5\' 4"  (1.626 m)   Wt 77.1 kg   SpO2 97%   BMI 29.18 kg/m    Final Clinical Impressions(s) / ED Diagnoses   Final diagnoses:  Acute on chronic systolic congestive heart failure (HCC)  CAP (community acquired pneumonia)    New Prescriptions New Prescriptions   No medications on file   6:29 AM Patient presents with sharp chest pain and dyspnea, slightly reproducible on exam. She's wearing O2, having mild resp discomfort. She also complaining of back pain. Back Pain is not reproducible on exam. She has intact distal pulses to all 4 extremities. Her pain is improving without specific treatment. Vital signs stable. At this time I have low suspicion for dissection. She does have history of GERD therefore, GI cocktail given. Cardiac workup initiated. She has had rales on the  lungs on exam suspect worsening CHF due to not compliant with Lasix  7:26 AM Patient is currently resting comfortably, she reported improvement of her pain despite use not receiving any specific treatment yet. No acute respiratory discomfort. Her EKG shows no acute ischemic changes, troponin is unremarkable and her labs are reassuring.  7:56 AM Chest x-ray shows cardiomegaly, interstitial edema and a patchy consolidation of the left lung base in which pneumonia is not excluded. Patient does admits that she has a chronic cough productive with phlegm for most of this year. She denies having any fever. She report normally coughing after eating. She does have a history of Parkinson which does increase her risk of aspiration pneumonia. Plan to treat with zpak  8:25 AM Elevated BNP of 294, no prior value documented for comparison however pt had hx of CHF, but haven't taking her Lasix for the past several days because she is having difficulty walking to her bathroom 2/2 her Parkinson disease.  CXR with interstitial edema without pleural effusion. I will encourage pt to take her Lasix as prescribed.  Care discussed with DR. Allen.   8:48 AM Appreciate consultation from [facility] Dr. Denton Brick who agrees to admit pt to telemetry bed under his care.  He also request pt to receive Levaquin abx while in the ER.      Domenic Moras, PA-C 05/25/16 [id number]    Veryl Speak, MD 05/26/16 [phone]

## 2016-05-25 NOTE — Progress Notes (Signed)
Called by RN for patient exhibiting increased clonic movement of patients face and neck.  VSS, Patient alert denies pain or SOB.  Patient states she does this sometimes at home and this is not new.  RN has called MD and updated.  No RRT interventions at this time.  RN to call if assistance needed

## 2016-05-26 DIAGNOSIS — I10 Essential (primary) hypertension: Secondary | ICD-10-CM

## 2016-05-26 DIAGNOSIS — G20 Parkinson's disease: Secondary | ICD-10-CM

## 2016-05-26 DIAGNOSIS — J189 Pneumonia, unspecified organism: Secondary | ICD-10-CM

## 2016-05-26 DIAGNOSIS — K5900 Constipation, unspecified: Secondary | ICD-10-CM

## 2016-05-26 LAB — BASIC METABOLIC PANEL
Anion gap: 6 (ref 5–15)
BUN: 16 mg/dL (ref 6–20)
CALCIUM: 8.8 mg/dL — AB (ref 8.9–10.3)
CO2: 31 mmol/L (ref 22–32)
Chloride: 104 mmol/L (ref 101–111)
Creatinine, Ser: 0.9 mg/dL (ref 0.44–1.00)
GFR calc Af Amer: >60 mL/min (ref >60)
GLUCOSE: 90 mg/dL (ref 65–99)
Potassium: 4 mmol/L (ref 3.5–5.1)
SODIUM: 141 mmol/L (ref 135–145)

## 2016-05-26 LAB — CBC
HCT: 34.3 % — ABNORMAL LOW (ref 36.0–46.0)
Hemoglobin: 10.3 g/dL — ABNORMAL LOW (ref 12.0–15.0)
MCH: 23.1 pg — ABNORMAL LOW (ref 26.0–34.0)
MCHC: 30 g/dL (ref 30.0–36.0)
MCV: 77.1 fL — ABNORMAL LOW (ref 78.0–100.0)
PLATELETS: 150 10*3/uL (ref 150–400)
RBC: 4.45 MIL/uL (ref 3.87–5.11)
RDW: 17.6 % — AB (ref 11.5–15.5)
WBC: 7.4 10*3/uL (ref 4.0–10.5)

## 2016-05-26 MED ORDER — LEVOFLOXACIN IN D5W 750 MG/150ML IV SOLN
750.0000 mg | INTRAVENOUS | Status: DC
  Administered 2016-05-26: 750 mg via INTRAVENOUS
  Filled 2016-05-26: qty 150

## 2016-05-26 MED ORDER — BISACODYL 10 MG RE SUPP
10.0000 mg | Freq: Every day | RECTAL | Status: DC
Start: 2016-05-26 — End: 2016-06-01
  Administered 2016-05-31: 10 mg via RECTAL
  Filled 2016-05-26 (×3): qty 1

## 2016-05-26 MED ORDER — POLYETHYLENE GLYCOL 3350 17 G PO PACK
17.0000 g | PACK | Freq: Two times a day (BID) | ORAL | Status: DC
  Administered 2016-05-26 – 2016-06-01 (×10): 17 g via ORAL
  Filled 2016-05-26 (×13): qty 1

## 2016-05-26 MED ORDER — MAGNESIUM SULFATE 2 GM/50ML IV SOLN: 2.0000 g | Freq: Once | INTRAVENOUS | Status: DC

## 2016-05-26 MED ORDER — FUROSEMIDE 10 MG/ML IJ SOLN
40.0000 mg | Freq: Two times a day (BID) | INTRAMUSCULAR | Status: DC
  Administered 2016-05-26 – 2016-05-28 (×4): 40 mg via INTRAVENOUS
  Filled 2016-05-26 (×4): qty 4

## 2016-05-26 MED ORDER — METOPROLOL TARTRATE 25 MG PO TABS
25.0000 mg | ORAL_TABLET | Freq: Two times a day (BID) | ORAL | Status: DC
  Administered 2016-05-26 – 2016-05-27 (×4): 25 mg via ORAL
  Filled 2016-05-26 (×5): qty 1

## 2016-05-26 MED ORDER — GUAIFENESIN-DM 100-10 MG/5ML PO SYRP
5.0000 mL | ORAL_SOLUTION | ORAL | Status: DC | PRN
  Administered 2016-05-26: 5 mL via ORAL
  Filled 2016-05-26: qty 5

## 2016-05-26 MED ORDER — IPRATROPIUM-ALBUTEROL 0.5-2.5 (3) MG/3ML IN SOLN
3.0000 mL | Freq: Two times a day (BID) | RESPIRATORY_TRACT | Status: DC
  Administered 2016-05-26 – 2016-06-01 (×11): 3 mL via RESPIRATORY_TRACT
  Filled 2016-05-26 (×12): qty 3

## 2016-05-26 MED ORDER — MAGNESIUM SULFATE IN D5W 1-5 GM/100ML-% IV SOLN
1.0000 g | Freq: Once | INTRAVENOUS | Status: AC
  Administered 2016-05-26: 1 g via INTRAVENOUS
  Filled 2016-05-26: qty 100

## 2016-05-26 MED ORDER — LEVOFLOXACIN 750 MG PO TABS
750.0000 mg | ORAL_TABLET | Freq: Every day | ORAL | Status: AC
  Administered 2016-05-27: 750 mg via ORAL
  Filled 2016-05-26: qty 1

## 2016-05-26 NOTE — Evaluation (Signed)
Clinical/Bedside Swallow Evaluation Patient Details  Name: [name] MRN: [id number] Date of Birth: 05/12/1941  Today's Date: 05/26/2016 Time: SLP Start Time (ACUTE ONLY): 0911 SLP Stop Time (ACUTE ONLY): 0921 SLP Time Calculation (min) (ACUTE ONLY): 10 min  Past Medical History:  Past Medical History:  Diagnosis Date  . Acute on chronic systolic heart failure ([place])   . Anemia   . Anxiety   . Arthritis   . Cataract left  . Cellulitis and abscess of leg, except foot   . Cellulitis of lower leg 12/24/2011  . CHF (congestive heart failure) ([place])   . Chronic bronchitis ([place])    "usually get it q yr"  . Chronic systolic heart failure ([place])   . GERD (gastroesophageal reflux disease)   . Glaucoma left  . Helicobacter pylori (H. pylori)   . History of bleeding peptic ulcer   . History of blood transfusion    "don't remember why"  . Hypercholesterolemia   . Hypertension   . Hypotension, unspecified   . Hypothyroidism   . Parkinson's disease   . Sleep apnea    "had test years ago; insurance wouldn't pay for mask so I never had one" (04/27/2015)  . Stroke [facility]) X 3   "that  was the reason I got Parkinson's"   Past Surgical History:  Past Surgical History:  Procedure Laterality Date  . BACK SURGERY    . ESOPHAGOGASTRODUODENOSCOPY  09/26/2011   Procedure: ESOPHAGOGASTRODUODENOSCOPY (EGD);  Surgeon: Zenovia Jarred, MD;  Location: [facility] ENDOSCOPY;  Service: Gastroenterology;  Laterality: N/A;  To be done at bedside.  . ESOPHAGOGASTRODUODENOSCOPY N/A 09/30/2013   Procedure: ESOPHAGOGASTRODUODENOSCOPY (EGD);  Surgeon: [name], MD;  Location: [place];  Service: Endoscopy;  Laterality: N/A;  . ESOPHAGOGASTRODUODENOSCOPY N/A 11/25/2013   Procedure: ESOPHAGOGASTRODUODENOSCOPY (EGD);  Surgeon: [name], MD;  Location[facility];  Service: Endoscopy;  Laterality: N/A;  . HEMIARTHROPLASTY HIP Right 09/24/2007   Archie Endo 01/23/2011  . KYPHOPLASTY  06/2009   T12/notes 07/17/2009  . NISSEN  FUNDOPLICATION     "had OR for GERD"  . TENDON REPAIR     Gluteus medius Archie Endo 01/23/2011  . THYROIDECTOMY    . TONSILLECTOMY     HPI:  75 yr old admitted with CHF exacerbation and pna. PMH: Parkinson's, GERD, CVA, chronic bronchitis, HTN, hiatal hernia. CXR cardiomegaly. Interstitial edema.   Assessment / Plan / Recommendation Clinical Impression  Despite tremorous oral movements, pt masticated and propelled solid textures without difficulty. Decreased laryngeal elevation upon palaption without cough, throat clear or wet vocal quality. She denied prior dysphagia. Pt exhibited labored breathing which she reports has "been going on for awhile." Educated pt re: relationship of swallow and respirations and encouraged her to defer meal if breathing is labored, take breaks during meals, small sips and upright posture. Continue regular texture and thin liquids, straws allowed, pills with thin. If pt is readmitted with pna, an MBS would be warranted. No further f/U.     Aspiration Risk  Moderate aspiration risk    Diet Recommendation Regular;Thin liquid   Liquid Administration via: Cup;Straw Medication Administration: Whole meds with liquid Supervision: Patient able to self feed Compensations: Slow rate;Small sips/bites Postural Changes: Seated upright at 90 degrees;Remain upright for at least 30 minutes after po intake    Other  Recommendations Oral Care Recommendations: Oral care BID   Follow up Recommendations  None    Frequency and Duration            Prognosis  Swallow Study   General HPI: 75 yr old admitted with CHF exacerbation and pna. PMH: Parkinson's, GERD, CVA, chronic bronchitis, HTN, hiatal hernia. CXR cardiomegaly. Interstitial edema. Type of Study: Bedside Swallow Evaluation Previous Swallow Assessment:  (see HPI) Diet Prior to this Study: Regular;Thin liquids Temperature Spikes Noted: No Respiratory Status: Nasal cannula History of Recent Intubation:  No Behavior/Cognition: Pleasant mood;Cooperative;Alert Oral Cavity Assessment: Within Functional Limits Oral Care Completed by SLP: No Oral Cavity - Dentition: Missing dentition Vision: Functional for self-feeding Self-Feeding Abilities: Able to feed self Patient Positioning: Upright in bed Baseline Vocal Quality:  (tremorous) Volitional Cough: Weak Volitional Swallow: Able to elicit    Oral/Motor/Sensory Function Overall Oral Motor/Sensory Function:  (tremorous)   Ice Chips Ice chips: Not tested   Thin Liquid Thin Liquid: Impaired Presentation: Straw Pharyngeal  Phase Impairments: Decreased hyoid-laryngeal movement    Nectar Thick Nectar Thick Liquid: Not tested   Honey Thick Honey Thick Liquid: Not tested   Puree Puree: Not tested   Solid   GO   Solid: Within functional limits        [name] 05/26/2016,9:32 AM Orbie Pyo Colvin Caroli.Ed [employer] [phone]

## 2016-05-26 NOTE — Evaluation (Signed)
Physical Therapy Evaluation Patient Details Name: [name] MRN: [id number] DOB: 02-02-1941 Today's Date: 05/26/2016   History of Present Illness  Patient is a 75 yo female admitted 05/25/16 with chest pain, weakness, SOB.  Patient with CHF exacerbation, CAP.   PMH:  Parkinson's disease, CHF, HTN, anemia, anxiety, CVA, sleep apnea, LE cellulitis    Clinical Impression  Patient presents with problems listed below.  Will benefit from acute PT to maximize functional mobility prior to discharge. Session limited today due to fatigue - patient OOB most of the day and just returned to bed.  Agreed to eval/bed mobility.   Patient lives alone, has prn assist.  Has had mult falls.  Recommend [name] at d/c for continued therapy.    Follow Up Recommendations [name];Supervision for mobility/OOB    Equipment Recommendations  None recommended by PT    Recommendations for Other Services OT consult     Precautions / Restrictions Precautions Precautions: Fall Precaution Comments: H/o falls at home. Patient with significant dyskinesia throughout Restrictions Weight Bearing Restrictions: No      Mobility  Bed Mobility Overal bed mobility: Needs Assistance Bed Mobility: Rolling Rolling: Min guard         General bed mobility comments: Able to roll to both sides with use of rail.  Min guard for safety.  Patient had been OOB most of the day, and just returned to bed.  Declined further mobility today.  Transfers                 General transfer comment: NT  Ambulation/Gait                Stairs            Wheelchair Mobility    Modified Rankin (Stroke Patients Only)       Balance                                             Pertinent Vitals/Pain Pain Assessment: No/denies pain    Home Living Family/patient expects to be discharged to:: Private residence Living Arrangements: Alone Available Help at Discharge: Family;Personal care  attendant;Available PRN/intermittently (Aide 3x/week; brother) Type of Home: House Home Access: Ramped entrance     Home Layout: One level Home Equipment: [place] - 4 wheels;Bedside commode;Tub bench;Wheelchair - Press photographer;Shower seat - built in Brewing technologist)      Prior Function Level of Independence: Independent with assistive device(s);Needs assistance   Gait / Transfers Assistance Needed: Uses rollator for gait.  Sleeps in lift chair.  ADL's / Homemaking Assistance Needed: Aide assists with bath, housekeeping, some meals.  Brother assists with transportation, or patient uses [facility].  [employer].        Hand Dominance        Extremity/Trunk Assessment   Upper Extremity Assessment: Generalized weakness (Dyskinesia)           Lower Extremity Assessment: RLE deficits/detail;LLE deficits/detail RLE Deficits / Details: Strength grossly 3+/5; decreased coordination LLE Deficits / Details: Strength grossly 3/5; decreased coordination; Increased difficulty moving LLE compared to RLE  Cervical / Trunk Assessment: Other exceptions  Communication   Communication: Expressive difficulties (dysarthria due to dyskinesia; weak voice)  Cognition Arousal/Alertness: Awake/alert Behavior During Therapy: WFL for tasks assessed/performed Overall Cognitive Status: Within Functional Limits for tasks assessed  General Comments      Exercises        Assessment/Plan    PT Assessment Patient needs continued PT services  PT Diagnosis Difficulty walking;Generalized weakness   PT Problem List Decreased strength;Decreased activity tolerance;Decreased balance;Decreased mobility;Decreased coordination;Decreased knowledge of use of DME;Cardiopulmonary status limiting activity  PT Treatment Interventions DME instruction;Gait training;Functional mobility training;Therapeutic activities;Therapeutic exercise;Balance training;Patient/family  education   PT Goals (Current goals can be found in the Care Plan section) Acute Rehab PT Goals Patient Stated Goal: None stated today PT Goal Formulation: With patient Time For Goal Achievement: 06/02/16 Potential to Achieve Goals: Fair    Frequency Min 3X/week   Barriers to discharge Decreased caregiver support Patient alone most of the day    Co-evaluation               End of Session   Activity Tolerance: Patient limited by fatigue Patient left: in bed;with call bell/phone within reach;with bed alarm set           Time: [id number] PT Time Calculation (min) (ACUTE ONLY): 12 min   Charges:   PT Evaluation $PT Eval Moderate Complexity: 1 Procedure     PT G CodesDespina Pole 2016/05/27, 7:49 PM Carita Pian. Sanjuana Kava, [place] Pager [phone]

## 2016-05-26 NOTE — Progress Notes (Signed)
PROGRESS NOTE                                                                                                                                                                                                             Patient Demographics:    [name], is a 75 y.o. female, DOB - November 20, 1940, FY:[id number]  Admit date - 05/25/2016   Admitting Physician Courage Denton Brick, MD  Outpatient Primary MD for the patient is REED, [name], DO  LOS - 1  Chief Complaint  Patient presents with  . Chest Pain       Brief Narrative     [name]  is a 75 y.o. female, With past medical history relevant for Parkinson's disease and CHF who presents with cough and shortness of breath for almost 3 days now. Patient apparently quit taking Lasix couple days ago because she did not want to have to walk to the bathroom. Cough is with white to slightly yellow sputum, shortness of breath worsened over the last 12 hours, no significant leg swelling no pleuritic symptoms. Patient complains of slight chest tightness. No fevers or chills. She does have some disorganization, no orthopnea or paroxysmal nocturnal dyspnea. Patient has a caregiver that checks on her daily. No vomiting or diarrhea in fact no BM in the last 6 days (she has chronic constipation).    Subjective:    [name] today has, No headache, No chest pain, No abdominal pain - No Nausea, No new weakness tingling or numbness, No Cough - improved SOB.     Assessment  & Plan :    1. Acute on chronic diastolic CHF with last EF of 60%. Has already shown improvement with IV Lasix which will be continued, will add low-dose beta blocker, continue fluid and salt restriction, continue supportive care with oxygen and nebulizer treatments and monitor.  Echocardiogram pending.  Filed Weights   05/25/16 0559 05/25/16 1126 05/26/16 0519  Weight: 77.1 kg (170 lb) 77.9 kg (171 lb 11.8 oz) 76.2 kg (167  lb 15.9 oz)   2.Community-acquired pneumonia. On Levaquin. Afebrile, will check speech evaluation to rule out aspiration in the setting of Parkinson's, if remains afebrile without leukocytosis will stop Levaquin in 3 days.  3. History of Parkinson's. Continue home regimen, has significant resting tremors. PT eval, may require placement.  4. Chronic constipation.  Place on bowel regimen.  5. Essential hypertension. Stable, low-dose beta blocker added Will monitor blood pressure with diuresis.   Family Communication  :  none  Code Status :  Full  Diet : Heart Healthy  Disposition Plan  :  [name]   Consults  :  None  Procedures  :     TTE   DVT Prophylaxis  :   Heparin    Lab Results  Component Value Date   PLT 150 05/26/2016    Inpatient Medications  Scheduled Meds: . artificial tears   Both Eyes Daily  . aspirin EC  81 mg Oral Daily  . carbidopa-levodopa  1 tablet Oral 3 times per day  . carbidopa-levodopa  1.5 tablet Oral 3 times per day  . cholecalciferol  2,000 Units Oral Daily  . ferrous sulfate  325 mg Oral Daily  . furosemide  40 mg Intravenous BID  . guaiFENesin  600 mg Oral BID  . heparin  5,000 Units Subcutaneous Q8H  . ipratropium-albuterol  3 mL Nebulization BID  . latanoprost  1 drop Left Eye q1800  . levofloxacin (LEVAQUIN) IV  750 mg Intravenous Q24H  . multivitamin with minerals  1 tablet Oral Daily  . polyethylene glycol  17 g Oral Daily  . pramipexole  1 mg Oral TID  . senna  1 tablet Oral BID  . sodium chloride flush  3 mL Intravenous Q12H  . sodium chloride flush  3 mL Intravenous Q12H   Continuous Infusions:  PRN Meds:.sodium chloride, acetaminophen **OR** acetaminophen, albuterol, LORazepam, ondansetron **OR** ondansetron (ZOFRAN) IV, oxyCODONE, sodium chloride flush, traZODone  Antibiotics  :    Anti-infectives    Start     Dose/Rate Route Frequency Ordered Stop   05/26/16 0900  levofloxacin (LEVAQUIN) IVPB 750 mg     750 mg 100 mL/hr  over 90 Minutes Intravenous Every 24 hours 05/25/16 1231     05/25/16 0845  levofloxacin (LEVAQUIN) IVPB 750 mg     750 mg 100 mL/hr over 90 Minutes Intravenous  Once 05/25/16 0845 05/25/16 1037         Objective:   Vitals:   05/25/16 1939 05/25/16 2127 05/26/16 0015 05/26/16 0519  BP: (!) 111/43  129/88 (!) 116/54  Pulse: 71  95 70  Resp: 18  18 18   Temp: 98 F (36.7 C)  98 F (36.7 C) 97.5 F (36.4 C)  TempSrc: Oral  Oral Oral  SpO2: 99% 97% 98% 98%  Weight:    76.2 kg (167 lb 15.9 oz)  Height:        Wt Readings from Last 3 Encounters:  05/26/16 76.2 kg (167 lb 15.9 oz)  05/16/16 78.9 kg (174 lb)  04/02/16 77.1 kg (170 lb)     Intake/Output Summary (Last 24 hours) at 05/26/16 0912 Last data filed at 05/26/16 0700  Gross per 24 hour  Intake              480 ml  Output             2850 ml  Net            -2370 ml     Physical Exam  Awake Alert, Oriented X 2, No new F.N deficits, Normal affect, Generalized tremors all over [place].AT,PERRAL Supple Neck,No JVD, No cervical lymphadenopathy appriciated.  Symmetrical Chest wall movement, Good air movement bilaterally, few rales RRR,No Gallops,Rubs or new Murmurs, No Parasternal Heave +ve B.Sounds, Abd Soft, No tenderness, No organomegaly appriciated,  No rebound - guarding or rigidity. No Cyanosis, Clubbing or edema, No new Rash or bruise      Data Review:    CBC  Recent Labs Lab 05/25/16 0635 05/26/16 0245  WBC 7.4 7.4  HGB 9.1* 10.3*  HCT 30.0* 34.3*  PLT 182 150  MCV 107.1* 77.1*  MCH 32.5 23.1*  MCHC 30.3 30.0  RDW 16.8* 17.6*    Chemistries   Recent Labs Lab 05/25/16 0635 05/26/16 0245  NA 139 141  K 4.0 4.0  CL 107 104  CO2 25 31  GLUCOSE 93 90  BUN 16 16  CREATININE 0.76 0.90  CALCIUM 9.1 8.8*   ------------------------------------------------------------------------------------------------------------------ No results for input(s): CHOL, HDL, LDLCALC, TRIG, CHOLHDL, LDLDIRECT in the  last 72 hours.  No results found for: HGBA1C ------------------------------------------------------------------------------------------------------------------  Recent Labs  05/25/16 1134  TSH 0.762   ------------------------------------------------------------------------------------------------------------------ No results for input(s): VITAMINB12, FOLATE, FERRITIN, TIBC, IRON, RETICCTPCT in the last 72 hours.  Coagulation profile No results for input(s): INR, PROTIME in the last 168 hours.  No results for input(s): DDIMER in the last 72 hours.  Cardiac Enzymes  Recent Labs Lab 05/25/16 1134 05/25/16 1516 05/25/16 2058  TROPONINI <0.03 <0.03 <0.03   ------------------------------------------------------------------------------------------------------------------    Component Value Date/Time   BNP 294.0 (H) 05/25/2016 [id number]    Micro Results No results found for this or any previous visit (from the past 240 hour(s)).  Radiology Reports Dg Chest 2 View  Result Date: 05/25/2016 CLINICAL DATA:  Central chest pain since this morning EXAM: CHEST  2 VIEW COMPARISON:  August 30, 2015 FINDINGS: The heart size is enlarged. Mediastinal contour is normal. There is increased pulmonary interstitium bilaterally. Patchy opacity is identified in the left lung base. There is no pleural effusion. There is a hiatal hernia. The bony structures are stable. IMPRESSION: Cardiomegaly.  Interstitial edema. Patchy consolidation of left lung base pneumonia is not excluded. Electronically Signed   By: Abelardo Diesel M.D.   On: 05/25/2016 07:35    Time Spent in minutes  30   Erykah Lippert K M.D on 05/26/2016 at 9:12 AM  Between 7am to 7pm - Pager - [phone]  After 7pm go to [email or web address] - password [facility]  [facility] -  Office  [phone]

## 2016-05-26 NOTE — Progress Notes (Signed)
Patient have 11 beats of V. Tach, pt. Denies pain, pt asymptomatic, v/s stable, MD notified. 1 g mag iv ordered. Will continue to monitor patient.

## 2016-05-26 NOTE — Progress Notes (Signed)
PHARMACIST - PHYSICIAN COMMUNICATION  CONCERNING: Antibiotic IV to Oral Route Change Policy  RECOMMENDATION: This patient is receiving Levaquin by the intravenous route.  Based on criteria approved by the Pharmacy and Therapeutics Committee, the antibiotic(s) is/are being converted to the equivalent oral dose form(s).   DESCRIPTION: These criteria include:  Patient being treated for a respiratory tract infection, urinary tract infection, cellulitis or clostridium difficile associated diarrhea if on metronidazole  The patient is not neutropenic and does not exhibit a GI malabsorption state  The patient is eating (either orally or via tube) and/or has been taking other orally administered medications for a least 24 hours  The patient is improving clinically and has a Tmax < 100.5  If you have questions about this conversion, please contact the Pharmacy Department  []   [phone] )  [facility] []   [phone] )  [facility] [x]   [phone] )  [facility] []   [phone] )  [facility] []   [phone] )  [place], [place] D 05/26/2016 12:21 PM

## 2016-05-27 ENCOUNTER — Inpatient Hospital Stay (HOSPITAL_COMMUNITY): Payer: Medicare Other

## 2016-05-27 ENCOUNTER — Other Ambulatory Visit (HOSPITAL_COMMUNITY): Payer: Medicare Other

## 2016-05-27 ENCOUNTER — Encounter (HOSPITAL_COMMUNITY): Payer: Self-pay | Admitting: General Practice

## 2016-05-27 DIAGNOSIS — R55 Syncope and collapse: Secondary | ICD-10-CM

## 2016-05-27 LAB — BASIC METABOLIC PANEL
ANION GAP: 5 (ref 5–15)
BUN: 23 mg/dL — AB (ref 6–20)
CHLORIDE: 102 mmol/L (ref 101–111)
CO2: 32 mmol/L (ref 22–32)
Calcium: 9.1 mg/dL (ref 8.9–10.3)
Creatinine, Ser: 1 mg/dL (ref 0.44–1.00)
GFR calc Af Amer: >60 mL/min (ref >60)
GFR calc non Af Amer: 54 mL/min — ABNORMAL LOW (ref >60)
GLUCOSE: 93 mg/dL (ref 65–99)
POTASSIUM: 4.1 mmol/L (ref 3.5–5.1)
SODIUM: 139 mmol/L (ref 135–145)

## 2016-05-27 LAB — ECHOCARDIOGRAM COMPLETE
Height: 64 in
WEIGHTICAEL: 2652.57 [oz_av]

## 2016-05-27 LAB — MAGNESIUM: MAGNESIUM: 2.2 mg/dL (ref 1.7–2.4)

## 2016-05-27 NOTE — Progress Notes (Signed)
PROGRESS NOTE                                                                                                                                                                                                             Patient Demographics:    [name], is a 75 y.o. female, DOB - 1940-10-19, FY:[id number]  Admit date - 05/25/2016   Admitting Physician Courage Denton Brick, MD  Outpatient Primary MD for the patient is REED, [name], DO  LOS - 2  Chief Complaint  Patient presents with  . Chest Pain       Brief Narrative     [name]  is a 75 y.o. female, With past medical history relevant for Parkinson's disease and CHF who presents with cough and shortness of breath for almost 3 days now. Patient apparently quit taking Lasix couple days ago because she did not want to have to walk to the bathroom. Cough is with white to slightly yellow sputum, shortness of breath worsened over the last 12 hours, no significant leg swelling no pleuritic symptoms. Patient complains of slight chest tightness. No fevers or chills. She does have some disorganization, no orthopnea or paroxysmal nocturnal dyspnea. Patient has a caregiver that checks on her daily. No vomiting or diarrhea in fact no BM in the last 6 days (she has chronic constipation).    Subjective:    [name] today has, No headache, No chest pain, No abdominal pain - No Nausea, No new weakness tingling or numbness, No Cough - improved SOB.     Assessment  & Plan :    1. Acute on chronic diastolic CHF with last EF of 60%. Has already shown improvement with IV Lasix which will be continued, will add low-dose beta blocker, continue fluid and salt restriction, continue supportive care with oxygen and nebulizer treatments and monitor.  Echocardiogram pending. -3 lits.  Filed Weights   05/25/16 1126 05/26/16 0519 05/27/16 0551  Weight: 77.9 kg (171 lb 11.8 oz) 76.2 kg (167 lb 15.9  oz) 75.2 kg (165 lb 12.6 oz)   2.Community-acquired pneumonia. On Levaquin. Afebrile, will check speech evaluation to rule out aspiration in the setting of Parkinson's, if remains afebrile without leukocytosis will stop Levaquin in 3 days.  3. History of Parkinson's. Continue home regimen, has significant resting tremors. PT eval, may require placement.  4. Chronic constipation. Place on bowel regimen.  5. Essential hypertension. Stable, low-dose beta blocker added Will monitor blood pressure with diuresis.   Family Communication  :  none  Code Status :  Full  Diet : Heart Healthy  Disposition Plan  :  [name]   Consults  :  None  Procedures  :     TTE   DVT Prophylaxis  :   Heparin    Lab Results  Component Value Date   PLT 150 05/26/2016    Inpatient Medications  Scheduled Meds: . artificial tears   Both Eyes Daily  . aspirin EC  81 mg Oral Daily  . bisacodyl  10 mg Rectal Daily  . carbidopa-levodopa  1 tablet Oral 3 times per day  . carbidopa-levodopa  1.5 tablet Oral 3 times per day  . cholecalciferol  2,000 Units Oral Daily  . ferrous sulfate  325 mg Oral Daily  . furosemide  40 mg Intravenous BID  . guaiFENesin  600 mg Oral BID  . heparin  5,000 Units Subcutaneous Q8H  . ipratropium-albuterol  3 mL Nebulization BID  . latanoprost  1 drop Left Eye q1800  . metoprolol tartrate  25 mg Oral BID  . multivitamin with minerals  1 tablet Oral Daily  . polyethylene glycol  17 g Oral BID  . pramipexole  1 mg Oral TID  . senna  1 tablet Oral BID  . sodium chloride flush  3 mL Intravenous Q12H  . sodium chloride flush  3 mL Intravenous Q12H   Continuous Infusions:  PRN Meds:.sodium chloride, acetaminophen **OR** acetaminophen, albuterol, guaiFENesin-dextromethorphan, LORazepam, ondansetron **OR** ondansetron (ZOFRAN) IV, oxyCODONE, sodium chloride flush, traZODone  Antibiotics  :    Anti-infectives    Start     Dose/Rate Route Frequency Ordered Stop   05/27/16  1000  levofloxacin (LEVAQUIN) tablet 750 mg     750 mg Oral Daily 05/26/16 1221 05/27/16 0954   05/26/16 1000  levofloxacin (LEVAQUIN) IVPB 750 mg  Status:  Discontinued     750 mg 100 mL/hr over 90 Minutes Intravenous Every 24 hours 05/26/16 0913 05/26/16 1221   05/26/16 0900  levofloxacin (LEVAQUIN) IVPB 750 mg  Status:  Discontinued     750 mg 100 mL/hr over 90 Minutes Intravenous Every 24 hours 05/25/16 1231 05/26/16 0913   05/25/16 0845  levofloxacin (LEVAQUIN) IVPB 750 mg     750 mg 100 mL/hr over 90 Minutes Intravenous  Once 05/25/16 0845 05/25/16 1037         Objective:   Vitals:   05/26/16 2045 05/27/16 0551 05/27/16 0744 05/27/16 1128  BP:  (!) 125/54  (!) 146/53  Pulse:  66  70  Resp:  18  18  Temp:  97.5 F (36.4 C)  97.8 F (36.6 C)  TempSrc:  Oral  Oral  SpO2: 94% 98% 95% 98%  Weight:  75.2 kg (165 lb 12.6 oz)    Height:        Wt Readings from Last 3 Encounters:  05/27/16 75.2 kg (165 lb 12.6 oz)  05/16/16 78.9 kg (174 lb)  04/02/16 77.1 kg (170 lb)     Intake/Output Summary (Last 24 hours) at 05/27/16 1142 Last data filed at 05/27/16 1132  Gross per 24 hour  Intake             1420 ml  Output             2650 ml  Net            -  1230 ml     Physical Exam  Awake Alert, Oriented X 2, No new F.N deficits, Normal affect, Generalized tremors all over [place].AT,PERRAL Supple Neck,No JVD, No cervical lymphadenopathy appriciated.  Symmetrical Chest wall movement, Good air movement bilaterally, few rales RRR,No Gallops,Rubs or new Murmurs, No Parasternal Heave +ve B.Sounds, Abd Soft, No tenderness, No organomegaly appriciated, No rebound - guarding or rigidity. No Cyanosis, Clubbing or edema, No new Rash or bruise      Data Review:    CBC  Recent Labs Lab 05/25/16 0635 05/26/16 0245  WBC 7.4 7.4  HGB 9.1* 10.3*  HCT 30.0* 34.3*  PLT 182 150  MCV 107.1* 77.1*  MCH 32.5 23.1*  MCHC 30.3 30.0  RDW 16.8* 17.6*    Chemistries   Recent  Labs Lab 05/25/16 0635 05/26/16 0245 05/27/16 0316  NA 139 141 139  K 4.0 4.0 4.1  CL 107 104 102  CO2 25 31 32  GLUCOSE 93 90 93  BUN 16 16 23*  CREATININE 0.76 0.90 1.00  CALCIUM 9.1 8.8* 9.1  MG  --   --  2.2   ------------------------------------------------------------------------------------------------------------------ No results for input(s): CHOL, HDL, LDLCALC, TRIG, CHOLHDL, LDLDIRECT in the last 72 hours.  No results found for: HGBA1C ------------------------------------------------------------------------------------------------------------------  Recent Labs  05/25/16 1134  TSH 0.762   ------------------------------------------------------------------------------------------------------------------ No results for input(s): VITAMINB12, FOLATE, FERRITIN, TIBC, IRON, RETICCTPCT in the last 72 hours.  Coagulation profile No results for input(s): INR, PROTIME in the last 168 hours.  No results for input(s): DDIMER in the last 72 hours.  Cardiac Enzymes  Recent Labs Lab 05/25/16 1134 05/25/16 1516 05/25/16 2058  TROPONINI <0.03 <0.03 <0.03   ------------------------------------------------------------------------------------------------------------------    Component Value Date/Time   BNP 294.0 (H) 05/25/2016 [id number]    Micro Results No results found for this or any previous visit (from the past 240 hour(s)).  Radiology Reports Dg Chest 2 View  Result Date: 05/25/2016 CLINICAL DATA:  Central chest pain since this morning EXAM: CHEST  2 VIEW COMPARISON:  August 30, 2015 FINDINGS: The heart size is enlarged. Mediastinal contour is normal. There is increased pulmonary interstitium bilaterally. Patchy opacity is identified in the left lung base. There is no pleural effusion. There is a hiatal hernia. The bony structures are stable. IMPRESSION: Cardiomegaly.  Interstitial edema. Patchy consolidation of left lung base pneumonia is not excluded. Electronically  Signed   By: Abelardo Diesel M.D.   On: 05/25/2016 07:35    Time Spent in minutes  30   Wanda Cellucci K M.D on 05/27/2016 at 11:42 AM  Between 7am to 7pm - Pager - [phone]  After 7pm go to [email or web address] - password [facility]  [facility] -  Office  [phone]

## 2016-05-27 NOTE — Progress Notes (Signed)
  Echocardiogram 2D Echocardiogram has been performed.  [name] 05/27/2016, 4:51 PM

## 2016-05-28 ENCOUNTER — Ambulatory Visit: Payer: Medicare Other | Admitting: Podiatry

## 2016-05-28 ENCOUNTER — Other Ambulatory Visit: Payer: Self-pay | Admitting: Licensed Clinical Social Worker

## 2016-05-28 DIAGNOSIS — I5033 Acute on chronic diastolic (congestive) heart failure: Secondary | ICD-10-CM

## 2016-05-28 MED ORDER — FUROSEMIDE 10 MG/ML IJ SOLN: 40.0000 mg | Freq: Every day | INTRAMUSCULAR | Status: DC

## 2016-05-28 MED ORDER — METOPROLOL TARTRATE 12.5 MG HALF TABLET
12.5000 mg | ORAL_TABLET | Freq: Two times a day (BID) | ORAL | Status: DC
  Administered 2016-05-28 – 2016-06-01 (×7): 12.5 mg via ORAL
  Filled 2016-05-28 (×8): qty 1

## 2016-05-28 MED ORDER — FUROSEMIDE 40 MG PO TABS
40.0000 mg | ORAL_TABLET | Freq: Every day | ORAL | Status: DC
  Administered 2016-05-28: 40 mg via ORAL
  Filled 2016-05-28: qty 1

## 2016-05-28 MED ORDER — LISINOPRIL 5 MG PO TABS
5.0000 mg | ORAL_TABLET | Freq: Every day | ORAL | Status: DC
  Administered 2016-05-28: 5 mg via ORAL
  Filled 2016-05-28 (×2): qty 1

## 2016-05-28 NOTE — Consult Note (Signed)
Cardiology Consult    Patient ID: [name] MRN: [id number], DOB/AGE: 76/13/1942   Admit date: 05/25/2016 Date of Consult: 05/28/2016  Primary Physician: Hollace Kinnier, DO Primary Cardiologist: Dr. Sallyanne Kuster Requesting Provider: Dr. Candiss Norse Reason for Consultation: CHF  Patient Profile    75 yo female with PMH of diastolic CHF/ Parkinson's disease/HTN/hypothyroidism who presented to the [facility] ED on 9/2 with dyspnea.   Past Medical History   Past Medical History:  Diagnosis Date  . Acute on chronic systolic heart failure ([place])   . Anemia   . Anxiety   . Arthritis   . Cataract left  . Cellulitis and abscess of leg, except foot   . Cellulitis of lower leg 12/24/2011  . CHF (congestive heart failure) ([place])   . Chronic bronchitis ([place])    "usually get it q yr"  . Chronic systolic heart failure ([place])   . GERD (gastroesophageal reflux disease)   . Glaucoma left  . Helicobacter pylori (H. pylori)   . History of bleeding peptic ulcer   . History of blood transfusion    "don't remember why"  . Hypercholesterolemia   . Hypertension   . Hypotension, unspecified   . Hypothyroidism   . Parkinson's disease   . Pneumonia   . Sleep apnea    "had test years ago; insurance wouldn't pay for mask so I never had one" (04/27/2015)  . Stroke [facility]) X 3   "that  was the reason I got Parkinson's"    Past Surgical History:  Procedure Laterality Date  . BACK SURGERY    . ESOPHAGOGASTRODUODENOSCOPY  09/26/2011   Procedure: ESOPHAGOGASTRODUODENOSCOPY (EGD);  Surgeon: Zenovia Jarred, MD;  Location: [facility] ENDOSCOPY;  Service: Gastroenterology;  Laterality: N/A;  To be done at bedside.  . ESOPHAGOGASTRODUODENOSCOPY N/A 09/30/2013   Procedure: ESOPHAGOGASTRODUODENOSCOPY (EGD);  Surgeon: [name], MD;  Location: [place];  Service: Endoscopy;  Laterality: N/A;  . ESOPHAGOGASTRODUODENOSCOPY N/A 11/25/2013   Procedure: ESOPHAGOGASTRODUODENOSCOPY (EGD);  Surgeon: [name], MD;  Location: [facility]  ENDOS[facility];  Service: Endoscopy;  Laterality: N/A;  . HEMIARTHROPLASTY HIP Right 09/24/2007   Archie Endo 01/23/2011  . KYPHOPLASTY  06/2009   T12/notes 07/17/2009  . NISSEN FUNDOPLICATION     "had OR for GERD"  . TENDON REPAIR     Gluteus medius Ar[facility] Endo 01/23/2011  . THYROIDECTOMY    . TONSILLECTOMY       Allergies  Allergies  Allergen Reactions  . Codeine Other (See Comments)    Unknown allergic reaction  . Penicillins Other (See Comments)    Pt passed out in doctor's office after penicillin dose    History of Present Illness    [name] is a 75 yo female patient of Dr. Sallyanne Kuster with PMH of  diastolic CHF/ Parkinson's disease/HTN/hypothyroidism.   Inpatient Medications    . artificial tears   Both Eyes Daily  . aspirin EC  81 mg Oral Daily  . bisacodyl  10 mg Rectal Daily  . carbidopa-levodopa  1 tablet Oral 3 times per day  . carbidopa-levodopa  1.5 tablet Oral 3 times per day  . cholecalciferol  2,000 Units Oral Daily  . ferrous sulfate  325 mg Oral Daily  . [START ON 05/29/2016] furosemide  40 mg Intravenous Daily  . guaiFENesin  600 mg Oral BID  . heparin  5,000 Units Subcutaneous Q8H  . ipratropium-albuterol  3 mL Nebulization BID  . latanoprost  1 drop Left Eye q1800  . metoprolol tartrate  25 mg  Oral BID  . multivitamin with minerals  1 tablet Oral Daily  . polyethylene glycol  17 g Oral BID  . pramipexole  1 mg Oral TID  . senna  1 tablet Oral BID    Family History    Family History  Problem Relation Age of Onset  . GER disease Mother   . Heart disease Father     Social History    Social History   Social History  . Marital status: Single    Spouse name: N/A  . Number of children: 0  . Years of education: 12   Occupational History  .      Retired   Social History Main Topics  . Smoking status: Never Smoker  . Smokeless tobacco: Never Used  . Alcohol use No  . Drug use: No  . Sexual activity: No   Other Topics Concern  . Not on file   Social  History Narrative   Patient lives at home alone. Patient is retired. Patient has high school education.   Caffeine- one daily.   Right handed.     Review of Systems    General:  No chills, fever, night sweats or weight changes.  Cardiovascular:  No chest pain, dyspnea on exertion, edema, orthopnea, palpitations, paroxysmal nocturnal dyspnea. Dermatological: No rash, lesions/masses Respiratory: No cough, dyspnea Urologic: No hematuria, dysuria Abdominal:   No nausea, vomiting, diarrhea, bright red blood per rectum, melena, or hematemesis Neurologic:  No visual changes, wkns, changes in mental status. All other systems reviewed and are otherwise negative except as noted above.  Physical Exam    Blood pressure (!) 102/42, pulse 70, temperature 97.4 F (36.3 C), temperature source Oral, resp. rate 18, height 5\' 4"  (1.626 m), weight 163 lb 4.8 oz (74.1 kg), SpO2 96 %.  General: Pleasant, NAD Psych: Normal affect. Neuro: Alert and oriented X 3. Moves all extremities spontaneously. Parkinson's tremor in UE;s   HEENT: Normal  Neck: Supple without bruits or JVD. Lungs:  Resp regular and unlabored, CTA. Heart: RRR soft SEM  Abdomen: Soft, non-tender, non-distended, BS + x 4.  Extremities: Trace edema mild erythema   Labs    Troponin (Point of Care Test) No results for input(s): TROPIPOC in the last 72 hours.  Recent Labs  05/25/16 1134 05/25/16 1516 05/25/16 2058  TROPONINI <0.03 <0.03 <0.03   Lab Results  Component Value Date   WBC 7.4 05/26/2016   HGB 10.3 (L) 05/26/2016   HCT 34.3 (L) 05/26/2016   MCV 77.1 (L) 05/26/2016   PLT 150 05/26/2016    Recent Labs Lab 05/27/16 0316  NA 139  K 4.1  CL 102  CO2 32  BUN 23*  CREATININE 1.00  CALCIUM 9.1  GLUCOSE 93   Lab Results  Component Value Date   CHOL 159 12/11/2011   HDL 72 12/11/2011   LDLCALC 78 12/11/2011   TRIG 44 12/11/2011   Lab Results  Component Value Date   DDIMER 0.53 (H) 12/10/2011       Radiology Studies    Dg Chest 2 View  Result Date: 05/25/2016 CLINICAL DATA:  Central chest pain since this morning EXAM: CHEST  2 VIEW COMPARISON:  August 30, 2015 FINDINGS: The heart size is enlarged. Mediastinal contour is normal. There is increased pulmonary interstitium bilaterally. Patchy opacity is identified in the left lung base. There is no pleural effusion. There is a hiatal hernia. The bony structures are stable. IMPRESSION: Cardiomegaly.  Interstitial edema. Patchy consolidation of left  lung base pneumonia is not excluded. Electronically Signed   By: Abelardo Diesel M.D.   On: 05/25/2016 07:35    ECG & Cardiac Imaging    EKG:  Echo: 05/27/2016  Study Conclusions  - Left ventricle: The cavity size was normal. Wall thickness was   increased in a pattern of mild LVH. Systolic function was mildly   to moderately reduced. The estimated ejection fraction was in the   range of 40% to 45%. Diffuse hypokinesis. Features are consistent   with a pseudonormal left ventricular filling pattern, with   concomitant abnormal relaxation and increased filling pressure   (grade 2 diastolic dysfunction). Doppler parameters are   consistent with high ventricular filling pressure. - Aortic valve: Moderately calcified annulus. Trileaflet; mildly   calcified leaflets. 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 There   was trivial regurgitation. Peak velocity (S): 232 cm/s. Mean   gradient (S): 11 mm Hg. Valve area (VTI): 0.68 cm^2. Valve area   (Vmax): 0.7 cm^2. Valve area (Vmean): 0.83 cm^2. - Mitral valve: Calcified annulus. - Left atrium: The atrium was severely dilated. - Right ventricle: Systolic function was mildly reduced. - Right atrium: The atrium was mildly to moderately dilated. - Atrial septum: There was a patent foramen ovale. - Tricuspid valve: There was mild-moderate regurgitation.  Assessment & Plan    CHF:  Etiology from med non compliance Quickly improves with diuretic. Would  not W/u further in hospital Resume oral home dose lasix at low dose ACE for low EF has F/u with primary Teresita Madura already scheduled  Decrease dose beta blocker to make Room for ACE  Parkinson's :  Stable back on sinemet f/u Dr Lesia Hausen to d/c in am   Valentina Shaggy

## 2016-05-28 NOTE — Consult Note (Signed)
   [facility] Inpatient Consult   05/28/2016  [name] [birth year] [id number]  Patient is currently active with [facility] for chronic disease management services with her Medicare and primary care provider.  Patient has been engaged by a [employer] and CSW.  Chart review reveals the patient is a 75 y.o.female,with past medical history relevant for Parkinson'sdisease and HF who presents with cough and shortness of breath for almost 3 days now. Patient apparently quit taking Lasix couple days ago because she did not want to have to walk to the bathroom. Cough with shortness of breath worsened over the last 12 hours [prior to admission] no significant leg swelling no pleuritic symptoms. Patient complains of slight chest tightness.   Our community based plan of care has focused on disease management and community resource support.  Patient will receive a post discharge transition of care call and will be evaluated for monthly home visits for assessments and disease process education.  Patient consents to ongoing follow up with [facility].  She states she has help at home 24/7 with her son's fiance'. Of note, [facility] Management services does not replace or interfere with any services that are needed or arranged by inpatient case management or social work. Current PT notes is for skilled nursing care at a facility.  Will follow for post hospital disposition needs.  For additional questions or referrals please contact:  [name] Brood, RN BSN [facility] Liaison  [phone] business mobile phone Toll free office [phone]

## 2016-05-28 NOTE — Progress Notes (Addendum)
PROGRESS NOTE                                                                                                                                                                                                             Patient Demographics:    [name], is a 75 y.o. female, DOB - 1941/05/31, [id number]  Admit date - 05/25/2016   Admitting Physician Courage Denton Brick, MD  Outpatient Primary MD for the patient is REED, [name], DO  LOS - 3  Chief Complaint  Patient presents with  . Chest Pain       Brief Narrative     [name]  is a 75 y.o. female, With past medical history relevant for Parkinson's disease and CHF who presents with cough and shortness of breath for almost 3 days now. Patient apparently quit taking Lasix couple days ago because she did not want to have to walk to the bathroom. Cough is with white to slightly yellow sputum, shortness of breath worsened over the last 12 hours, no significant leg swelling no pleuritic symptoms. Patient complains of slight chest tightness. No fevers or chills. She does have some disorganization, no orthopnea or paroxysmal nocturnal dyspnea. Patient has a caregiver that checks on her daily. No vomiting or diarrhea in fact no BM in the last 6 days (she has chronic constipation).    Subjective:    [name] today has, No headache, No chest pain, No abdominal pain - No Nausea, No new weakness tingling or numbness, No Cough - improved SOB.     Assessment  & Plan :    1. Acute on chronic diastolic CHF with last EF of 45%. Has already shown improvement with IV Lasix which will be dropped to once a day on 05/28/2016, on low-dose beta blocker, continue fluid and salt restriction, continue supportive care with oxygen and nebulizer treatments and monitor. Her EF has remarkably dropped from 60% to 45% in the last few years with wall motion abnormality, will request cardiology to evaluate  as well, much improved from CHF standpoint, - 4.5 lits.  Filed Weights   05/26/16 0519 05/27/16 0551 05/28/16 0501  Weight: 76.2 kg (167 lb 15.9 oz) 75.2 kg (165 lb 12.6 oz) 74.1 kg (163 lb 4.8 oz)   2.Community-acquired pneumonia. On Levaquin. Afebrile, will check speech evaluation to rule out aspiration  in the setting of Parkinson's, if remains afebrile without leukocytosis will stop Levaquin in 3 days.  3. History of Parkinson's. Continue home regimen, has significant resting tremors. PT eval, may require placement.  4. Chronic constipation. Placed on bowel regimen.  5. Essential hypertension. Stable, low-dose beta blocker added Will monitor blood pressure with reduced Lasix dose.   Family Communication  :  none  Code Status :  Full  Diet : Heart Healthy  Disposition Plan  :  [name]   Consults  :  Cards  Procedures  :     TTE  - Left ventricle: The cavity size was normal. Wall thickness was increased in a pattern of mild LVH. Systolic function was mildly to moderately reduced. The estimated ejection fraction was in the range of 40% to 45%. Diffuse hypokinesis. Features are consistent with a pseudonormal left ventricular filling pattern, with concomitant abnormal relaxation and increased filling pressure (grade 2 diastolic dysfunction). Doppler parameters are consistent with high ventricular filling pressure. - Aortic valve: Moderately calcified annulus. Trileaflet; mildly calcified leaflets. 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 There was trivial regurgitation. Peak velocity (S): 232 cm/s. Mean gradient (S): 11 mm Hg. Valve area (VTI): 0.68 cm^2. Valve area (Vmax): 0.7 cm^2. Valve area (Vmean): 0.83 cm^2. - Mitral valve: Calcified annulus. - Left atrium: The atrium was severely dilated. - Right ventricle: Systolic function was mildly reduced. - Right atrium: The atrium was mildly to moderately dilated. - Atrial septum: There was a patent foramen ovale. - Tricuspid valve: There was  mild-moderate regurgitation.   DVT Prophylaxis  :   Heparin    Lab Results  Component Value Date   PLT 150 05/26/2016    Inpatient Medications  Scheduled Meds: . artificial tears   Both Eyes Daily  . aspirin EC  81 mg Oral Daily  . bisacodyl  10 mg Rectal Daily  . carbidopa-levodopa  1 tablet Oral 3 times per day  . carbidopa-levodopa  1.5 tablet Oral 3 times per day  . cholecalciferol  2,000 Units Oral Daily  . ferrous sulfate  325 mg Oral Daily  . furosemide  40 mg Intravenous BID  . guaiFENesin  600 mg Oral BID  . heparin  5,000 Units Subcutaneous Q8H  . ipratropium-albuterol  3 mL Nebulization BID  . latanoprost  1 drop Left Eye q1800  . metoprolol tartrate  25 mg Oral BID  . multivitamin with minerals  1 tablet Oral Daily  . polyethylene glycol  17 g Oral BID  . pramipexole  1 mg Oral TID  . senna  1 tablet Oral BID  . sodium chloride flush  3 mL Intravenous Q12H  . sodium chloride flush  3 mL Intravenous Q12H   Continuous Infusions:  PRN Meds:.sodium chloride, acetaminophen **OR** acetaminophen, albuterol, guaiFENesin-dextromethorphan, LORazepam, ondansetron **OR** ondansetron (ZOFRAN) IV, oxyCODONE, sodium chloride flush, traZODone  Antibiotics  :    Anti-infectives    Start     Dose/Rate Route Frequency Ordered Stop   05/27/16 1000  levofloxacin (LEVAQUIN) tablet 750 mg     750 mg Oral Daily 05/26/16 1221 05/27/16 0954   05/26/16 1000  levofloxacin (LEVAQUIN) IVPB 750 mg  Status:  Discontinued     750 mg 100 mL/hr over 90 Minutes Intravenous Every 24 hours 05/26/16 0913 05/26/16 1221   05/26/16 0900  levofloxacin (LEVAQUIN) IVPB 750 mg  Status:  Discontinued     750 mg 100 mL/hr over 90 Minutes Intravenous Every 24 hours 05/25/16 1231 05/26/16 0913  05/25/16 0845  levofloxacin (LEVAQUIN) IVPB 750 mg     750 mg 100 mL/hr over 90 Minutes Intravenous  Once 05/25/16 0845 05/25/16 1037         Objective:   Vitals:   05/27/16 2028 05/27/16 2037 05/28/16  0501 05/28/16 0936  BP: (!) 101/35  (!) 127/48 (!) 102/42  Pulse: 75  66 70  Resp: 18  18   Temp: 97.5 F (36.4 C)  97.4 F (36.3 C)   TempSrc: Oral  Oral   SpO2: 96% 96% 96%   Weight:   74.1 kg (163 lb 4.8 oz)   Height:        Wt Readings from Last 3 Encounters:  05/28/16 74.1 kg (163 lb 4.8 oz)  05/16/16 78.9 kg (174 lb)  04/02/16 77.1 kg (170 lb)     Intake/Output Summary (Last 24 hours) at 05/28/16 0948 Last data filed at 05/28/16 0927  Gross per 24 hour  Intake              840 ml  Output             2350 ml  Net            -1510 ml     Physical Exam  Awake Alert, Oriented X 2, No new F.N deficits, Normal affect, Generalized tremors all over [place].AT,PERRAL Supple Neck,No JVD, No cervical lymphadenopathy appriciated.  Symmetrical Chest wall movement, Good air movement bilaterally, few rales RRR,No Gallops,Rubs or new Murmurs, No Parasternal Heave +ve B.Sounds, Abd Soft, No tenderness, No organomegaly appriciated, No rebound - guarding or rigidity. No Cyanosis, Clubbing or edema, No new Rash or bruise      Data Review:    CBC  Recent Labs Lab 05/25/16 0635 05/26/16 0245  WBC 7.4 7.4  HGB 9.1* 10.3*  HCT 30.0* 34.3*  PLT 182 150  MCV 107.1* 77.1*  MCH 32.5 23.1*  MCHC 30.3 30.0  RDW 16.8* 17.6*    Chemistries   Recent Labs Lab 05/25/16 0635 05/26/16 0245 05/27/16 0316  NA 139 141 139  K 4.0 4.0 4.1  CL 107 104 102  CO2 25 31 32  GLUCOSE 93 90 93  BUN 16 16 23*  CREATININE 0.76 0.90 1.00  CALCIUM 9.1 8.8* 9.1  MG  --   --  2.2   ------------------------------------------------------------------------------------------------------------------ No results for input(s): CHOL, HDL, LDLCALC, TRIG, CHOLHDL, LDLDIRECT in the last 72 hours.  No results found for: HGBA1C ------------------------------------------------------------------------------------------------------------------  Recent Labs  05/25/16 1134  TSH 0.762    ------------------------------------------------------------------------------------------------------------------ No results for input(s): VITAMINB12, FOLATE, FERRITIN, TIBC, IRON, RETICCTPCT in the last 72 hours.  Coagulation profile No results for input(s): INR, PROTIME in the last 168 hours.  No results for input(s): DDIMER in the last 72 hours.  Cardiac Enzymes  Recent Labs Lab 05/25/16 1134 05/25/16 1516 05/25/16 2058  TROPONINI <0.03 <0.03 <0.03   ------------------------------------------------------------------------------------------------------------------    Component Value Date/Time   BNP 294.0 (H) 05/25/2016 [id number]    Micro Results No results found for this or any previous visit (from the past 240 hour(s)).  Radiology Reports  Dg Chest 2 View  Result Date: 05/25/2016 CLINICAL DATA:  Central chest pain since this morning EXAM: CHEST  2 VIEW COMPARISON:  August 30, 2015 FINDINGS: The heart size is enlarged. Mediastinal contour is normal. There is increased pulmonary interstitium bilaterally. Patchy opacity is identified in the left lung base. There is no pleural effusion. There is a hiatal hernia. The bony structures  are stable. IMPRESSION: Cardiomegaly.  Interstitial edema. Patchy consolidation of left lung base pneumonia is not excluded. Electronically Signed   By: Abelardo Diesel M.D.   On: 05/25/2016 07:35    Time Spent in minutes  30   Natascha Edmonds K M.D on 05/28/2016 at 9:48 AM  Between 7am to 7pm - Pager - [phone]  After 7pm go to [email or web address] - password [facility]  [facility] -  Office  [phone]

## 2016-05-28 NOTE — Progress Notes (Signed)
Physical Therapy Treatment Patient Details Name: [name] MRN: [id number] DOB: 1940-11-28 Today's Date: 05/28/2016    History of Present Illness Patient is a 75 yo female admitted 05/25/16 with chest pain, weakness, SOB.  Patient with CHF exacerbation, CAP.   PMH:  Parkinson's disease, CHF, HTN, anemia, anxiety, CVA, sleep apnea, LE cellulitis    PT Comments    Patient progressing to ambulation this session and able to meet gait goal (updated this session.)  Still high fall risk with poor ability to adjust to direction changes and backing up to chair.  Feel she will benefit from skilled PT in the acute setting and follow up [name] level rehab at d/c.   Follow Up Recommendations  [name];Supervision for mobility/OOB     Equipment Recommendations  None recommended by PT    Recommendations for Other Services       Precautions / Restrictions Precautions Precautions: Fall Restrictions Weight Bearing Restrictions: No    Mobility  Bed Mobility               General bed mobility comments: Pt. up in chair  Transfers Overall transfer level: Needs assistance Equipment used: Rolling walker (2 wheeled) Transfers: Sit to/from Stand Sit to Stand: Max assist         General transfer comment: cues and facilitation for anterior weight shift, heavy lifting help and assist for controlled lowering  Ambulation/Gait Ambulation/Gait assistance: Min assist Ambulation Distance (Feet): 55 Feet (and 40' after seated rest in hallway)   Gait Pattern/deviations: Step-through pattern;Step-to pattern;Decreased stride length;Shuffle;Trunk flexed     General Gait Details: assist for balance, safety and cues for safety with backing up to chair, (difficulty side stepping and backing to chair)   Stairs            Wheelchair Mobility    Modified Rankin (Stroke Patients Only)       Balance Overall balance assessment: Needs assistance       Postural control: Posterior lean Standing  balance support: Bilateral upper extremity supported Standing balance-Leahy Scale: Poor Standing balance comment: initially needing assist to come forward for balance                    Cognition Arousal/Alertness: Awake/alert Behavior During Therapy: WFL for tasks assessed/performed Overall Cognitive Status: Within Functional Limits for tasks assessed                      Exercises      General Comments        Pertinent Vitals/Pain Pain Assessment: No/denies pain    Home Living                      Prior Function            PT Goals (current goals can now be found in the care plan section) Progress towards PT goals: Progressing toward goals    Frequency  Min 3X/week    PT Plan Current plan remains appropriate    Co-evaluation             End of Session Equipment Utilized During Treatment: Gait belt Activity Tolerance: Patient limited by fatigue Patient left: with call bell/phone within reach;in chair;with nursing/sitter in room     Time: [id number] PT Time Calculation (min) (ACUTE ONLY): 25 min  Charges:  $Gait Training: 23-37 mins  G CodesReginia Naas 05/28/2016, 11:58 AM  Magda Kiel, PT [phone] 05/28/2016

## 2016-05-28 NOTE — Care Management Important Message (Signed)
Important Message  Patient Details  Name: [name] MRN: [id number] Date of Birth: July 30, 1941   Medicare Important Message Given:  Yes    Orbie Pyo 05/28/2016, 10:15 AM

## 2016-05-28 NOTE — Patient Outreach (Signed)
[facility]) Care Management  05/28/2016  [name] 03/21/1941 [id number]   Assessment- CSW received in basket staff message from [place] on 05/28/16. She reported that patient has been admitted into the hospital. Patient was admitted on 05/25/16 with complaints of chest pain and increased dyspnea.  [place] will follow up with patient.  CSW will be out of the office from 06/01/16 until 06/12/16 and will update [facility] coverage social worker.  Plan-CSW will continue to follow patient and provide social work assistance.  [name], BSW, MSW, [email or web address] Phone: [phone] Fax: [phone]

## 2016-05-29 ENCOUNTER — Inpatient Hospital Stay (HOSPITAL_COMMUNITY): Payer: Medicare Other

## 2016-05-29 DIAGNOSIS — I5022 Chronic systolic (congestive) heart failure: Secondary | ICD-10-CM

## 2016-05-29 DIAGNOSIS — I5023 Acute on chronic systolic (congestive) heart failure: Secondary | ICD-10-CM

## 2016-05-29 LAB — BASIC METABOLIC PANEL
ANION GAP: 12 (ref 5–15)
BUN: 28 mg/dL — ABNORMAL HIGH (ref 6–20)
CALCIUM: 8.8 mg/dL — AB (ref 8.9–10.3)
CO2: 25 mmol/L (ref 22–32)
Chloride: 100 mmol/L — ABNORMAL LOW (ref 101–111)
Creatinine, Ser: 1.06 mg/dL — ABNORMAL HIGH (ref 0.44–1.00)
GFR, EST AFRICAN AMERICAN: 58 mL/min — AB (ref >60)
GFR, EST NON AFRICAN AMERICAN: 50 mL/min — AB (ref >60)
GLUCOSE: 158 mg/dL — AB (ref 65–99)
POTASSIUM: 3.6 mmol/L (ref 3.5–5.1)
Sodium: 137 mmol/L (ref 135–145)

## 2016-05-29 LAB — MAGNESIUM: MAGNESIUM: 2.5 mg/dL — AB (ref 1.7–2.4)

## 2016-05-29 MED ORDER — ALBUTEROL SULFATE (2.5 MG/3ML) 0.083% IN NEBU
2.5000 mg | INHALATION_SOLUTION | Freq: Once | RESPIRATORY_TRACT | Status: AC
  Administered 2016-05-29: 2.5 mg via RESPIRATORY_TRACT
  Filled 2016-05-29: qty 3

## 2016-05-29 MED ORDER — FUROSEMIDE 40 MG PO TABS
40.0000 mg | ORAL_TABLET | Freq: Two times a day (BID) | ORAL | Status: DC
  Administered 2016-05-29 (×2): 40 mg via ORAL
  Filled 2016-05-29 (×2): qty 1

## 2016-05-29 NOTE — Progress Notes (Signed)
PROGRESS NOTE                                                                                                                                                                                                             Patient Demographics:    [name], is a 75 y.o. female, DOB - 23-Feb-1941, FY:[id number]  Admit date - 05/25/2016   Admitting Physician Courage Denton Brick, MD  Outpatient Primary MD for the patient is REED, [name], DO  LOS - 4  Chief Complaint  Patient presents with  . Chest Pain       Brief Narrative     [name]  is a 75 y.o. female, With past medical history relevant for Parkinson's disease and CHF who presents with cough and shortness of breath for almost 3 days now. Patient apparently quit taking Lasix couple days ago because she did not want to have to walk to the bathroom. Cough is with white to slightly yellow sputum, shortness of breath worsened over the last 12 hours, no significant leg swelling no pleuritic symptoms. Patient complains of slight chest tightness. No fevers or chills. She does have some disorganization, no orthopnea or paroxysmal nocturnal dyspnea. Patient has a caregiver that checks on her daily. No vomiting or diarrhea in fact no BM in the last 6 days (she has chronic constipation).    Subjective:    [name] today has, No headache, No chest pain, No abdominal pain - No Nausea, No new weakness tingling or numbness, No Cough - +ve wheezing & SOB this am.     Assessment  & Plan :    1. Acute on chronic diastolic CHF with last EF of 45%. Has already shown improvement with IV Lasix which will be dropped to once a day on 05/28/2016, on low-dose beta blocker, continue fluid and salt restriction. Her EF has remarkably dropped from 60% to 45% in the last few years with wall motion abnormality, Seen by cardiology to further workup this admission, more short of breath with wheezing on  05/29/2016, blood pressure borderline, we'll gently diurese and hold the blood pressure medications the same, oxygen and nebulizer treatments, - 4.5 lits.  Filed Weights   05/27/16 0551 05/28/16 0501 05/29/16 0415  Weight: 75.2 kg (165 lb 12.6 oz) 74.1 kg (163 lb 4.8 oz) 75.2 kg (165 lb 12.6  oz)    2.Community-acquired pneumonia. On Levaquin. Afebrile, will check speech evaluation to rule out aspiration in the setting of Parkinson's, if remains afebrile without leukocytosis , Levaquin total 3 days then stop.  3. History of Parkinson's. Continue home regimen, has significant resting tremors. PT eval, may require placement.  4. Chronic constipation. Placed on bowel regimen.  5. Essential hypertension. Blood pressure soft morning of 05/29/2016, discontinue lisinopril, hold Lopressor, continue diuretics and monitor.   Family Communication  :  none  Code Status :  Full  Diet : Heart Healthy  Disposition Plan  :  [name]   Consults  :  Cards  Procedures  :     TTE  - Left ventricle: The cavity size was normal. Wall thickness was increased in a pattern of mild LVH. Systolic function was mildly to moderately reduced. The estimated ejection fraction was in the range of 40% to 45%. Diffuse hypokinesis. Features are consistent with a pseudonormal left ventricular filling pattern, with concomitant abnormal relaxation and increased filling pressure (grade 2 diastolic dysfunction). Doppler parameters are consistent with high ventricular filling pressure. - Aortic valve: Moderately calcified annulus. Trileaflet; mildly calcified leaflets. 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 There was trivial regurgitation. Peak velocity (S): 232 cm/s. Mean gradient (S): 11 mm Hg. Valve area (VTI): 0.68 cm^2. Valve area (Vmax): 0.7 cm^2. Valve area (Vmean): 0.83 cm^2. - Mitral valve: Calcified annulus. - Left atrium: The atrium was severely dilated. - Right ventricle: Systolic function was mildly reduced. - Right  atrium: The atrium was mildly to moderately dilated. - Atrial septum: There was a patent foramen ovale. - Tricuspid valve: There was mild-moderate regurgitation.   DVT Prophylaxis  :   Heparin    Lab Results  Component Value Date   PLT 150 05/26/2016    Inpatient Medications  Scheduled Meds: . artificial tears   Both Eyes Daily  . aspirin EC  81 mg Oral Daily  . bisacodyl  10 mg Rectal Daily  . carbidopa-levodopa  1 tablet Oral 3 times per day  . carbidopa-levodopa  1.5 tablet Oral 3 times per day  . cholecalciferol  2,000 Units Oral Daily  . ferrous sulfate  325 mg Oral Daily  . furosemide  40 mg Oral BID  . guaiFENesin  600 mg Oral BID  . heparin  5,000 Units Subcutaneous Q8H  . ipratropium-albuterol  3 mL Nebulization BID  . latanoprost  1 drop Left Eye q1800  . metoprolol tartrate  12.5 mg Oral BID  . multivitamin with minerals  1 tablet Oral Daily  . polyethylene glycol  17 g Oral BID  . pramipexole  1 mg Oral TID  . senna  1 tablet Oral BID   Continuous Infusions:  PRN Meds:.acetaminophen **OR** [DISCONTINUED] acetaminophen, albuterol, guaiFENesin-dextromethorphan, LORazepam, [DISCONTINUED] ondansetron **OR** ondansetron (ZOFRAN) IV, traZODone  Antibiotics  :    Anti-infectives    Start     Dose/Rate Route Frequency Ordered Stop   05/27/16 1000  levofloxacin (LEVAQUIN) tablet 750 mg     750 mg Oral Daily 05/26/16 1221 05/27/16 0954   05/26/16 1000  levofloxacin (LEVAQUIN) IVPB 750 mg  Status:  Discontinued     750 mg 100 mL/hr over 90 Minutes Intravenous Every 24 hours 05/26/16 0913 05/26/16 1221   05/26/16 0900  levofloxacin (LEVAQUIN) IVPB 750 mg  Status:  Discontinued     750 mg 100 mL/hr over 90 Minutes Intravenous Every 24 hours 05/25/16 1231 05/26/16 0913   05/25/16 0845  levofloxacin (LEVAQUIN)  IVPB 750 mg     750 mg 100 mL/hr over 90 Minutes Intravenous  Once 05/25/16 0845 05/25/16 1037         Objective:   Vitals:   05/29/16 0829 05/29/16 0835  05/29/16 0928 05/29/16 0935  BP:   (!) 84/50 (!) 86/52  Pulse: 66  82   Resp:      Temp:      TempSrc:      SpO2:  92%    Weight:      Height:        Wt Readings from Last 3 Encounters:  05/29/16 75.2 kg (165 lb 12.6 oz)  05/16/16 78.9 kg (174 lb)  04/02/16 77.1 kg (170 lb)     Intake/Output Summary (Last 24 hours) at 05/29/16 0956 Last data filed at 05/29/16 0600  Gross per 24 hour  Intake              702 ml  Output              825 ml  Net             -123 ml     Physical Exam  Awake Alert, Oriented X 2, No new F.N deficits, Normal affect, Generalized tremors all over [place].AT,PERRAL Supple Neck,No JVD, No cervical lymphadenopathy appriciated.  Symmetrical Chest wall movement, Good air movement bilaterally, few rales RRR,No Gallops,Rubs or new Murmurs, No Parasternal Heave +ve B.Sounds, Abd Soft, No tenderness, No organomegaly appriciated, No rebound - guarding or rigidity. No Cyanosis, Clubbing or edema, No new Rash or bruise      Data Review:    CBC  Recent Labs Lab 05/25/16 0635 05/26/16 0245  WBC 7.4 7.4  HGB 9.1* 10.3*  HCT 30.0* 34.3*  PLT 182 150  MCV 107.1* 77.1*  MCH 32.5 23.1*  MCHC 30.3 30.0  RDW 16.8* 17.6*    Chemistries   Recent Labs Lab 05/25/16 0635 05/26/16 0245 05/27/16 0316 05/29/16 0840  NA 139 141 139 137  K 4.0 4.0 4.1 3.6  CL 107 104 102 100*  CO2 25 31 32 25  GLUCOSE 93 90 93 158*  BUN 16 16 23* 28*  CREATININE 0.76 0.90 1.00 1.06*  CALCIUM 9.1 8.8* 9.1 8.8*  MG  --   --  2.2 2.5*   ------------------------------------------------------------------------------------------------------------------ No results for input(s): CHOL, HDL, LDLCALC, TRIG, CHOLHDL, LDLDIRECT in the last 72 hours.  No results found for: HGBA1C ------------------------------------------------------------------------------------------------------------------ No results for input(s): TSH, T4TOTAL, T3FREE, THYROIDAB in the last 72  hours.  Invalid input(s): FREET3 ------------------------------------------------------------------------------------------------------------------ No results for input(s): VITAMINB12, FOLATE, FERRITIN, TIBC, IRON, RETICCTPCT in the last 72 hours.  Coagulation profile No results for input(s): INR, PROTIME in the last 168 hours.  No results for input(s): DDIMER in the last 72 hours.  Cardiac Enzymes  Recent Labs Lab 05/25/16 1134 05/25/16 1516 05/25/16 2058  TROPONINI <0.03 <0.03 <0.03   ------------------------------------------------------------------------------------------------------------------    Component Value Date/Time   BNP 294.0 (H) 05/25/2016 [id number]    Micro Results No results found for this or any previous visit (from the past 240 hour(s)).  Radiology Reports  Dg Chest 2 View  Result Date: 05/25/2016 CLINICAL DATA:  Central chest pain since this morning EXAM: CHEST  2 VIEW COMPARISON:  August 30, 2015 FINDINGS: The heart size is enlarged. Mediastinal contour is normal. There is increased pulmonary interstitium bilaterally. Patchy opacity is identified in the left lung base. There is no pleural effusion. There is a hiatal hernia. The  bony structures are stable. IMPRESSION: Cardiomegaly.  Interstitial edema. Patchy consolidation of left lung base pneumonia is not excluded. Electronically Signed   By: Abelardo Diesel M.D.   On: 05/25/2016 07:35   Dg Chest Port 1 View  Result Date: 05/29/2016 CLINICAL DATA:  Shortness of breath for 1 day EXAM: PORTABLE CHEST 1 VIEW COMPARISON:  May 25, 2016 FINDINGS: The degree of inspiration is shallow. There is atelectatic change in the left base. The lungs elsewhere clear. Heart is mildly prominent with pulmonary vascularity within normal limits. No adenopathy. There is degenerative change in each shoulder. No pneumothorax but IMPRESSION: No base atelectasis. Lungs elsewhere clear. Stable cardiac silhouette. Note shallow degree of  inspiration. Electronically Signed   By: Lowella Grip III M.D.   On: 05/29/2016 08:26    Time Spent in minutes  30   Lyndel Dancel K M.D on 05/29/2016 at 9:56 AM  Between 7am to 7pm - Pager - [phone]  After 7pm go to [email or web address] - password [facility]  [facility] -  Office  [phone]

## 2016-05-29 NOTE — Clinical Social Work Note (Signed)
CSW met with patient to discuss PT recommendation. Patient refusing [name]. She has been to one in the past and was there for 2 1/2 months "instead of 2 weeks." Patient wants to go home with [facility]. She has worked with them in the past and really wants to work with the physical therapist, Shanon Brow, again. Will notify RNCM.  CSW signing off. Consult again if any social work needs arise.  Dayton Scrape, [place]

## 2016-05-29 NOTE — Progress Notes (Signed)
Notified by Dr. Candiss Norse at [phone] this am that pt was having SOB,  Checked 02 on RA 78%.  Pt placed on 2L 02, sats up to 86%.  Increase 02 to 3L sats 96%.  Notified MD and instructed to administer albuterol tx as ordered.  Notified RT.  Will continue to monitor.  Prisilla Kocsis,RN.

## 2016-05-29 NOTE — Progress Notes (Signed)
Instructed from Dr. Candiss Norse to hold metoprolol and to give lasix at llam .  Also instructed to get pt OOB to recliner.  Will continue to monitor.  Karie Kirks, Therapist, sports.

## 2016-05-29 NOTE — Care Management Note (Signed)
Case Management Note  Patient Details  Name: [name] MRN: [id number] Date of Birth: 1940-12-16  Subjective/Objective:  75 y.o. F admitted 05/25/2016 with CHF. Pt has hx Parkinsons Dz and lives in private residence. Son assists her with ADLs. PT recommending [name], pt refuses.She would like HH with [facility] if it is ordered. Notified Manuela Schwartz the [facility] rep.                    Action/Plan:CM will continue to follow for disposition and discharge needs.  Anticipate discharge home Thursday 05/30/2016. No further CM needs but will be available should additional discharge needs arise.   Expected Discharge Date:                  Expected Discharge Plan:  [place]  In-House Referral:  Clinical Social Work  Discharge planning Services  CM Consult  Post Acute Care Choice:  NA Choice offered to:  Patient  DME Arranged:  N/A DME Agency:  NA  HH Arranged:    [place] Agency:  [place]  Status of Service:  In process, will continue to follow  If discussed at Long Length of Stay Meetings, dates discussed:    Additional Comments:  Delrae Sawyers, RN 05/29/2016, 2:27 PM

## 2016-05-29 NOTE — Progress Notes (Signed)
Patient ID: [name], female   DOB: 04/12/1941, 75 y.o.   MRN: [id number]    Subjective:  Denies SSCP, palpitations or Dyspnea Eating breakfast   Objective:  Vitals:   05/28/16 2043 05/29/16 0415 05/29/16 0731 05/29/16 0829  BP: (!) 103/43 124/70    Pulse: 69 64  66  Resp: 18 18    Temp: 97.2 F (36.2 C) 97.4 F (36.3 C)    TempSrc: Oral Oral    SpO2: 95% 98% 98%   Weight:  75.2 kg (165 lb 12.6 oz)    Height:        Intake/Output from previous day:  Intake/Output Summary (Last 24 hours) at 05/29/16 0835 Last data filed at 05/29/16 0600  Gross per 24 hour  Intake              942 ml  Output             1075 ml  Net             -133 ml    Physical Exam: Affect appropriate Healthy:  appears stated age HEENT: normal Neck supple with no adenopathy JVP normal no bruits no thyromegaly Lungs clear with no wheezing and good diaphragmatic motion Heart:  S1/S2 no murmur, no rub, gallop or click PMI normal Abdomen: benighn, BS positve, no tenderness, no AAA no bruit.  No HSM or HJR Distal pulses intact with no bruits No edema Neuro non-focal  Parkinson's tremor UE;s and jaw  Skin warm and dry No muscular weakness   Lab Results: Basic Metabolic Panel:  Recent Labs  05/27/16 0316  NA 139  K 4.1  CL 102  CO2 32  GLUCOSE 93  BUN 23*  CREATININE 1.00  CALCIUM 9.1  MG 2.2     Imaging: Dg Chest Port 1 View  Result Date: 05/29/2016 CLINICAL DATA:  Shortness of breath for 1 day EXAM: PORTABLE CHEST 1 VIEW COMPARISON:  May 25, 2016 FINDINGS: The degree of inspiration is shallow. There is atelectatic change in the left base. The lungs elsewhere clear. Heart is mildly prominent with pulmonary vascularity within normal limits. No adenopathy. There is degenerative change in each shoulder. No pneumothorax but IMPRESSION: No base atelectasis. Lungs elsewhere clear. Stable cardiac silhouette. Note shallow degree of inspiration. Electronically Signed   By: Lowella Grip III M.D.   On: 05/29/2016 08:26    Cardiac Studies:  ECG:  SR rate 76 normal    Telemetry:  NSR 05/29/2016   Echo: EF 40-45%   Medications:   . artificial tears   Both Eyes Daily  . aspirin EC  81 mg Oral Daily  . bisacodyl  10 mg Rectal Daily  . carbidopa-levodopa  1 tablet Oral 3 times per day  . carbidopa-levodopa  1.5 tablet Oral 3 times per day  . cholecalciferol  2,000 Units Oral Daily  . ferrous sulfate  325 mg Oral Daily  . furosemide  40 mg Oral BID  . guaiFENesin  600 mg Oral BID  . heparin  5,000 Units Subcutaneous Q8H  . ipratropium-albuterol  3 mL Nebulization BID  . latanoprost  1 drop Left Eye q1800  . lisinopril  5 mg Oral Daily  . metoprolol tartrate  12.5 mg Oral BID  . multivitamin with minerals  1 tablet Oral Daily  . polyethylene glycol  17 g Oral BID  . pramipexole  1 mg Oral TID  . senna  1 tablet Oral BID  Assessment/Plan:   CHF:  Due to medical non compliance improved with lasix quickly ok to d/c home on current regimen  Will arrange outpatient f/u with Dr Sallyanne Kuster  Parkinson"s : stable continue carbidopa f/u neurology  Will sign off  Jenkins Rouge 05/29/2016, 8:35 AM

## 2016-05-30 ENCOUNTER — Ambulatory Visit: Payer: Self-pay | Admitting: Licensed Clinical Social Worker

## 2016-05-30 MED ORDER — METOLAZONE 2.5 MG PO TABS
2.5000 mg | ORAL_TABLET | Freq: Once | ORAL | Status: AC
Start: 2016-05-30 — End: 2016-05-30
  Administered 2016-05-30: 2.5 mg via ORAL
  Filled 2016-05-30: qty 1

## 2016-05-30 MED ORDER — FUROSEMIDE 10 MG/ML IJ SOLN
40.0000 mg | Freq: Two times a day (BID) | INTRAMUSCULAR | Status: DC
  Administered 2016-05-30: 40 mg via INTRAVENOUS
  Filled 2016-05-30: qty 4

## 2016-05-30 MED ORDER — FUROSEMIDE 10 MG/ML IJ SOLN
40.0000 mg | Freq: Once | INTRAMUSCULAR | Status: AC
Start: 2016-05-30 — End: 2016-05-30
  Administered 2016-05-30: 40 mg via INTRAVENOUS
  Filled 2016-05-30: qty 4

## 2016-05-30 NOTE — Progress Notes (Signed)
Patient ID: DEDREA DECOURCY, female   DOB: 05/29/1941, 75 y.o.   MRN: [id number]    Subjective:  Denies SSCP, palpitations or Dyspnea Eating breakfast   Objective:  Vitals:   05/29/16 1929 05/29/16 1948 05/29/16 2017 05/30/16 0440  BP: (!) 97/50  111/74 (!) 119/57  Pulse: 72  98 70  Resp:   16 18  Temp:   97.6 F (36.4 C) 97.9 F (36.6 C)  TempSrc:   Oral Oral  SpO2:  98% 98% 97%  Weight:    75.2 kg (165 lb 12.6 oz)  Height:        Intake/Output from previous day:  Intake/Output Summary (Last 24 hours) at 05/30/16 [id number] Last data filed at 05/30/16 [id number]  Gross per 24 hour  Intake              120 ml  Output             2001 ml  Net            -1881 ml    Physical Exam: Affect appropriate Healthy:  appears stated age HEENT: normal Neck supple with no adenopathy JVP normal no bruits no thyromegaly Lungs clear with no wheezing and good diaphragmatic motion Heart:  S1/S2 no murmur, no rub, gallop or click PMI normal Abdomen: benighn, BS positve, no tenderness, no AAA no bruit.  No HSM or HJR Distal pulses intact with no bruits No edema Neuro non-focal  Parkinson's tremor UE;s and jaw  Skin warm and dry No muscular weakness   Lab Results: Basic Metabolic Panel:  Recent Labs  05/29/16 0840  NA 137  K 3.6  CL 100*  CO2 25  GLUCOSE 158*  BUN 28*  CREATININE 1.06*  CALCIUM 8.8*  MG 2.5*     Imaging: Dg Chest Port 1 View  Result Date: 05/29/2016 CLINICAL DATA:  Shortness of breath for 1 day EXAM: PORTABLE CHEST 1 VIEW COMPARISON:  May 25, 2016 FINDINGS: The degree of inspiration is shallow. There is atelectatic change in the left base. The lungs elsewhere clear. Heart is mildly prominent with pulmonary vascularity within normal limits. No adenopathy. There is degenerative change in each shoulder. No pneumothorax but IMPRESSION: No base atelectasis. Lungs elsewhere clear. Stable cardiac silhouette. Note shallow degree of inspiration. Electronically Signed    By: Lowella Grip III M.D.   On: 05/29/2016 08:26    Cardiac Studies:  ECG:  SR rate 76 normal    Telemetry:  NSR 05/30/2016  05/30/2016    Echo: EF 40-45%   Medications:   . artificial tears   Both Eyes Daily  . aspirin EC  81 mg Oral Daily  . bisacodyl  10 mg Rectal Daily  . carbidopa-levodopa  1 tablet Oral 3 times per day  . carbidopa-levodopa  1.5 tablet Oral 3 times per day  . cholecalciferol  2,000 Units Oral Daily  . ferrous sulfate  325 mg Oral Daily  . furosemide  40 mg Intravenous BID  . guaiFENesin  600 mg Oral BID  . heparin  5,000 Units Subcutaneous Q8H  . ipratropium-albuterol  3 mL Nebulization BID  . latanoprost  1 drop Left Eye q1800  . metolazone  2.5 mg Oral Once  . metoprolol tartrate  12.5 mg Oral BID  . multivitamin with minerals  1 tablet Oral Daily  . polyethylene glycol  17 g Oral BID  . pramipexole  1 mg Oral TID  . senna  1 tablet Oral BID  Assessment/Plan:   CHF:  Due to medical non compliance improved with lasix quickly good diuresis change to PO  regimen  And d/c next 24 48 hrs   Will arrange outpatient f/u with Dr Sallyanne Kuster  Parkinson"s : stable continue carbidopa f/u neurology  Will sign off  Jenkins Rouge 05/30/2016, 9:53 AM

## 2016-05-30 NOTE — Progress Notes (Signed)
PROGRESS NOTE                                                                                                                                                                                                             Patient Demographics:    [name], is a 75 y.o. female, DOB - [birth year], FY:[id number]  Admit date - 05/25/2016   Admitting Physician Courage Denton Brick, MD  Outpatient Primary MD for the patient is REED, [place], DO  LOS - 5  Chief Complaint  Patient presents with  . Chest Pain       Brief Narrative     [name]  is a 75 y.o. female, With past medical history relevant for Parkinson's disease and CHF who presents with cough and shortness of breath for almost 3 days now. Patient apparently quit taking Lasix couple days ago because she did not want to have to walk to the bathroom. Cough is with white to slightly yellow sputum, shortness of breath worsened over the last 12 hours, no significant leg swelling no pleuritic symptoms. Patient complains of slight chest tightness. No fevers or chills. She does have some disorganization, no orthopnea or paroxysmal nocturnal dyspnea. Patient has a caregiver that checks on her daily. No vomiting or diarrhea in fact no BM in the last 6 days (she has chronic constipation).    Subjective:    [name] today has, No headache, No chest pain, No abdominal pain - No Nausea, No new weakness tingling or numbness, No Cough - +ve SOB this am.     Assessment  & Plan :    1. Acute on chronic diastolic CHF with last EF of 45%. Has already shown improvement with IV Lasix which will be dropped to once a day on 05/28/2016, on low-dose beta blocker, continue fluid and salt restriction. Her EF has remarkably dropped from 60% to 45% in the last few years with wall motion abnormality, Seen by cardiology no further workup this admission, Short of breath with rales on exam, switched back on  IV Lasix with Zaroxolyn, - 6 lits.  Filed Weights   05/28/16 0501 05/29/16 0415 05/30/16 0440  Weight: 74.1 kg (163 lb 4.8 oz) 75.2 kg (165 lb 12.6 oz) 75.2 kg (165 lb 12.6 oz)    2.Community-acquired pneumonia. On Levaquin. Afebrile, will check speech evaluation to  rule out aspiration in the setting of Parkinson's, if remains afebrile without leukocytosis , Levaquin total 3 days then stop.  3. History of Parkinson's. Continue home regimen, has significant resting tremors. PT eval, may require placement.  4. Chronic constipation. Placed on bowel regimen.  5. Essential hypertension. Continue monitoring on beta blocker and diuretics, blood pressure drops with ACE inhibitor.   Family Communication  :  none  Code Status :  Full  Diet : Heart Healthy  Disposition Plan  :  [name]   Consults  :  Cards  Procedures  :     TTE  - Left ventricle: The cavity size was normal. Wall thickness was increased in a pattern of mild LVH. Systolic function was mildly to moderately reduced. The estimated ejection fraction was in the range of 40% to 45%. Diffuse hypokinesis. Features are consistent with a pseudonormal left ventricular filling pattern, with concomitant abnormal relaxation and increased filling pressure (grade 2 diastolic dysfunction). Doppler parameters are consistent with high ventricular filling pressure. - Aortic valve: Moderately calcified annulus. Trileaflet; mildly calcified leaflets. 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 There was trivial regurgitation. Peak velocity (S): 232 cm/s. Mean gradient (S): 11 mm Hg. Valve area (VTI): 0.68 cm^2. Valve area (Vmax): 0.7 cm^2. Valve area (Vmean): 0.83 cm^2. - Mitral valve: Calcified annulus. - Left atrium: The atrium was severely dilated. - Right ventricle: Systolic function was mildly reduced. - Right atrium: The atrium was mildly to moderately dilated. - Atrial septum: There was a patent foramen ovale. - Tricuspid valve: There was  mild-moderate regurgitation.   DVT Prophylaxis  :   Heparin    Lab Results  Component Value Date   PLT 150 05/26/2016    Inpatient Medications  Scheduled Meds: . artificial tears   Both Eyes Daily  . aspirin EC  81 mg Oral Daily  . bisacodyl  10 mg Rectal Daily  . carbidopa-levodopa  1 tablet Oral 3 times per day  . carbidopa-levodopa  1.5 tablet Oral 3 times per day  . cholecalciferol  2,000 Units Oral Daily  . ferrous sulfate  325 mg Oral Daily  . furosemide  40 mg Intravenous BID  . guaiFENesin  600 mg Oral BID  . heparin  5,000 Units Subcutaneous Q8H  . ipratropium-albuterol  3 mL Nebulization BID  . latanoprost  1 drop Left Eye q1800  . metolazone  2.5 mg Oral Once  . metoprolol tartrate  12.5 mg Oral BID  . multivitamin with minerals  1 tablet Oral Daily  . polyethylene glycol  17 g Oral BID  . pramipexole  1 mg Oral TID  . senna  1 tablet Oral BID   Continuous Infusions:  PRN Meds:.acetaminophen **OR** [DISCONTINUED] acetaminophen, albuterol, guaiFENesin-dextromethorphan, LORazepam, [DISCONTINUED] ondansetron **OR** ondansetron (ZOFRAN) IV, traZODone  Antibiotics  :    Anti-infectives    Start     Dose/Rate Route Frequency Ordered Stop   05/27/16 1000  levofloxacin (LEVAQUIN) tablet 750 mg     750 mg Oral Daily 05/26/16 1221 05/27/16 0954   05/26/16 1000  levofloxacin (LEVAQUIN) IVPB 750 mg  Status:  Discontinued     750 mg 100 mL/hr over 90 Minutes Intravenous Every 24 hours 05/26/16 0913 05/26/16 1221   05/26/16 0900  levofloxacin (LEVAQUIN) IVPB 750 mg  Status:  Discontinued     750 mg 100 mL/hr over 90 Minutes Intravenous Every 24 hours 05/25/16 1231 05/26/16 0913   05/25/16 0845  levofloxacin (LEVAQUIN) IVPB 750 mg  750 mg 100 mL/hr over 90 Minutes Intravenous  Once 05/25/16 0845 05/25/16 1037         Objective:   Vitals:   05/29/16 1929 05/29/16 1948 05/29/16 2017 05/30/16 0440  BP: (!) 97/50  111/74 (!) 119/57  Pulse: 72  98 70  Resp:   16  18  Temp:   97.6 F (36.4 C) 97.9 F (36.6 C)  TempSrc:   Oral Oral  SpO2:  98% 98% 97%  Weight:    75.2 kg (165 lb 12.6 oz)  Height:        Wt Readings from Last 3 Encounters:  05/30/16 75.2 kg (165 lb 12.6 oz)  05/16/16 78.9 kg (174 lb)  04/02/16 77.1 kg (170 lb)     Intake/Output Summary (Last 24 hours) at 05/30/16 0949 Last data filed at 05/30/16 0904  Gross per 24 hour  Intake              120 ml  Output             2001 ml  Net            -1881 ml     Physical Exam  Awake Alert, Oriented X 2, No new F.N deficits, Normal affect, Generalized tremors all over [place].AT,PERRAL Supple Neck,No JVD, No cervical lymphadenopathy appriciated.  Symmetrical Chest wall movement, Good air movement bilaterally, ++ rales RRR,No Gallops,Rubs or new Murmurs, No Parasternal Heave +ve B.Sounds, Abd Soft, No tenderness, No organomegaly appriciated, No rebound - guarding or rigidity. No Cyanosis, Clubbing or edema, No new Rash or bruise      Data Review:    CBC  Recent Labs Lab 05/25/16 0635 05/26/16 0245  WBC 7.4 7.4  HGB 9.1* 10.3*  HCT 30.0* 34.3*  PLT 182 150  MCV 107.1* 77.1*  MCH 32.5 23.1*  MCHC 30.3 30.0  RDW 16.8* 17.6*    Chemistries   Recent Labs Lab 05/25/16 0635 05/26/16 0245 05/27/16 0316 05/29/16 0840  NA 139 141 139 137  K 4.0 4.0 4.1 3.6  CL 107 104 102 100*  CO2 25 31 32 25  GLUCOSE 93 90 93 158*  BUN 16 16 23* 28*  CREATININE 0.76 0.90 1.00 1.06*  CALCIUM 9.1 8.8* 9.1 8.8*  MG  --   --  2.2 2.5*   ------------------------------------------------------------------------------------------------------------------ No results for input(s): CHOL, HDL, LDLCALC, TRIG, CHOLHDL, LDLDIRECT in the last 72 hours.  No results found for: HGBA1C ------------------------------------------------------------------------------------------------------------------ No results for input(s): TSH, T4TOTAL, T3FREE, THYROIDAB in the last 72 hours.  Invalid  input(s): FREET3 ------------------------------------------------------------------------------------------------------------------ No results for input(s): VITAMINB12, FOLATE, FERRITIN, TIBC, IRON, RETICCTPCT in the last 72 hours.  Coagulation profile No results for input(s): INR, PROTIME in the last 168 hours.  No results for input(s): DDIMER in the last 72 hours.  Cardiac Enzymes  Recent Labs Lab 05/25/16 1134 05/25/16 1516 05/25/16 2058  TROPONINI <0.03 <0.03 <0.03   ------------------------------------------------------------------------------------------------------------------    Component Value Date/Time   BNP 294.0 (H) 05/25/2016 [id number]    Micro Results No results found for this or any previous visit (from the past 240 hour(s)).  Radiology Reports  Dg Chest 2 View  Result Date: 05/25/2016 CLINICAL DATA:  Central chest pain since this morning EXAM: CHEST  2 VIEW COMPARISON:  August 30, 2015 FINDINGS: The heart size is enlarged. Mediastinal contour is normal. There is increased pulmonary interstitium bilaterally. Patchy opacity is identified in the left lung base. There is no pleural effusion. There is a hiatal  hernia. The bony structures are stable. IMPRESSION: Cardiomegaly.  Interstitial edema. Patchy consolidation of left lung base pneumonia is not excluded. Electronically Signed   By: Abelardo Diesel M.D.   On: 05/25/2016 07:35   Dg Chest Port 1 View  Result Date: 05/29/2016 CLINICAL DATA:  Shortness of breath for 1 day EXAM: PORTABLE CHEST 1 VIEW COMPARISON:  May 25, 2016 FINDINGS: The degree of inspiration is shallow. There is atelectatic change in the left base. The lungs elsewhere clear. Heart is mildly prominent with pulmonary vascularity within normal limits. No adenopathy. There is degenerative change in each shoulder. No pneumothorax but IMPRESSION: No base atelectasis. Lungs elsewhere clear. Stable cardiac silhouette. Note shallow degree of inspiration.  Electronically Signed   By: Lowella Grip III M.D.   On: 05/29/2016 08:26    Time Spent in minutes  30   Demetre Monaco K M.D on 05/30/2016 at 9:49 AM  Between 7am to 7pm - Pager - [phone]  After 7pm go to [email or web address] - password [facility]  [facility] -  Office  [phone]

## 2016-05-31 ENCOUNTER — Inpatient Hospital Stay (HOSPITAL_COMMUNITY): Payer: Medicare Other

## 2016-05-31 ENCOUNTER — Other Ambulatory Visit: Payer: Self-pay | Admitting: Licensed Clinical Social Worker

## 2016-05-31 LAB — BASIC METABOLIC PANEL
Anion gap: 12 (ref 5–15)
BUN: 38 mg/dL — ABNORMAL HIGH (ref 6–20)
CO2: 29 mmol/L (ref 22–32)
Calcium: 9.3 mg/dL (ref 8.9–10.3)
Chloride: 97 mmol/L — ABNORMAL LOW (ref 101–111)
Creatinine, Ser: 1.15 mg/dL — ABNORMAL HIGH (ref 0.44–1.00)
GFR calc non Af Amer: 45 mL/min — ABNORMAL LOW (ref >60)
GFR, EST AFRICAN AMERICAN: 53 mL/min — AB (ref >60)
GLUCOSE: 131 mg/dL — AB (ref 65–99)
POTASSIUM: 4.1 mmol/L (ref 3.5–5.1)
SODIUM: 138 mmol/L (ref 135–145)

## 2016-05-31 LAB — MAGNESIUM: Magnesium: 2.6 mg/dL — ABNORMAL HIGH (ref 1.7–2.4)

## 2016-05-31 MED ORDER — FUROSEMIDE 10 MG/ML IJ SOLN
80.0000 mg | Freq: Two times a day (BID) | INTRAMUSCULAR | Status: DC
  Administered 2016-05-31 – 2016-06-01 (×2): 80 mg via INTRAVENOUS
  Filled 2016-05-31 (×2): qty 8

## 2016-05-31 MED ORDER — METOLAZONE 2.5 MG PO TABS
2.5000 mg | ORAL_TABLET | Freq: Once | ORAL | Status: AC
  Administered 2016-05-31: 2.5 mg via ORAL
  Filled 2016-05-31: qty 1

## 2016-05-31 NOTE — Care Management Note (Signed)
Case Management Note  Patient Details  Name: [name] MRN: [id number] Date of Birth: 1941-09-14  Subjective/Objective:    CHF, PNA, Parkinson's Disease                Action/Plan: Discharge Planning:  NCM spoke to pt and states she lives at home alone. She has private duty aide that comes 3x per week. She has friend, [name] that assist her at home. And will provide transportation at dc. States she has RW, wheelchair, hospital bed, neb machine, and shower chair. Offered choice for [facility], pt states she had [facility] arranged. Will follow up with [facility] at dc for [facility].   PCP- Gayland Curry MD  Expected Discharge Date:  06/01/2016               Expected Discharge Plan:  [place]  In-House Referral:  Clinical Social Work  Discharge planning Services  CM Consult  Post Acute Care Choice:  Home Health Choice offered to:  Patient  DME Arranged:  N/A DME Agency:  NA  HH Arranged:  PT, RN, Nurse's Aide [place] Agency:  [place]  Status of Service:  Completed, signed off  If discussed at [place] of Stay Meetings,  dates discussed:  05/30/2016  Additional Comments:  Erenest Rasher, RN 05/31/2016, 5:19 PM

## 2016-05-31 NOTE — Progress Notes (Signed)
Physical Therapy Treatment Patient Details Name: [name] MRN: [id number] DOB: 08-25-1941 Today's Date: 05/31/2016    History of Present Illness Patient is a 75 yo female admitted 05/25/16 with chest pain, weakness, SOB.  Patient with CHF exacerbation, CAP.   PMH:  Parkinson's disease, CHF, HTN, anemia, anxiety, CVA, sleep apnea, LE cellulitis    PT Comments    Pt performed increased gait and tolerated seated and standing therapeutic exercise.  Pt in good spirits and motivated to mobilize this session.  Will continue therapy per POC.    Follow Up Recommendations  [name];Supervision for mobility/OOB     Equipment Recommendations  None recommended by PT    Recommendations for Other Services       Precautions / Restrictions Precautions Precautions: Fall Precaution Comments: H/o falls at home. Patient with significant dyskinesia throughout Restrictions Weight Bearing Restrictions: No    Mobility  Bed Mobility Overal bed mobility: Modified Independent Bed Mobility: Supine to Sit Rolling: Modified independent (Device/Increase time)         General bed mobility comments: Good technique with increased time.    Transfers Overall transfer level: Needs assistance Equipment used: Rolling walker (2 wheeled) Transfers: Sit to/from Stand Sit to Stand: Min guard         General transfer comment: Cues for hand placement, forward weight shifting and hip/trunk extension to achieve erect standing.    Ambulation/Gait Ambulation/Gait assistance: Min assist Ambulation Distance (Feet): 80 Feet Assistive device: Rolling walker (2 wheeled) Gait Pattern/deviations: Step-through pattern;Ataxic;Decreased stride length;Trunk flexed;Scissoring;Narrow base of support   Gait velocity interpretation: Below normal speed for age/gender General Gait Details: Pt slow to sequence during turns and backing.  Pt required cues to keep Rw close and to increase BOS.  Pt presents with R IR or hip and  crossing midline during sequencing.     Stairs            Wheelchair Mobility    Modified Rankin (Stroke Patients Only)       Balance Overall balance assessment: Needs assistance   Sitting balance-Leahy Scale: Good       Standing balance-Leahy Scale: Poor                      Cognition Arousal/Alertness: Awake/alert Behavior During Therapy: WFL for tasks assessed/performed Overall Cognitive Status: Within Functional Limits for tasks assessed                      Exercises General Exercises - Lower Extremity Long Arc Quad: AROM;Both;10 reps;Seated Hip Flexion/Marching: AROM;Both;10 reps;Seated Heel Raises: AROM;Both;10 reps;Standing Mini-Sqauts: AROM;Both;10 reps;Standing (repeated sit to stands.)    General Comments        Pertinent Vitals/Pain Pain Assessment: No/denies pain    Home Living                      Prior Function            PT Goals (current goals can now be found in the care plan section) Acute Rehab PT Goals Patient Stated Goal: None stated today Potential to Achieve Goals: Fair Progress towards PT goals: Progressing toward goals    Frequency  Min 3X/week    PT Plan Current plan remains appropriate    Co-evaluation             End of Session Equipment Utilized During Treatment: Gait belt Activity Tolerance: Patient limited by fatigue;Patient tolerated treatment well Patient left: with call  bell/phone within reach;in chair;with chair alarm set     Time: [phone] PT Time Calculation (min) (ACUTE ONLY): 30 min  Charges:  $Gait Training: 8-22 mins $Therapeutic Activity: 8-22 mins                    G Codes:      Cristela Blue 06-06-16, 3:58 PM  Governor Rooks, PTA pager [phone]

## 2016-05-31 NOTE — Progress Notes (Signed)
PROGRESS NOTE                                                                                                                                                                                                             Patient Demographics:    [name], is a 75 y.o. female, DOB - 1941-07-26, FY:[id number]  Admit date - 05/25/2016   Admitting Physician Courage Denton Brick, MD  Outpatient Primary MD for the patient is REED, [name], DO  LOS - 6  Chief Complaint  Patient presents with  . Chest Pain       Brief Narrative     [name]  is a 75 y.o. female, With past medical history relevant for Parkinson's disease and CHF who presents with cough and shortness of breath for almost 3 days now. Patient apparently quit taking Lasix couple days ago because she did not want to have to walk to the bathroom. Cough is with white to slightly yellow sputum, shortness of breath worsened over the last 12 hours, no significant leg swelling no pleuritic symptoms. Patient complains of slight chest tightness. No fevers or chills. She does have some disorganization, no orthopnea or paroxysmal nocturnal dyspnea. Patient has a caregiver that checks on her daily. No vomiting or diarrhea in fact no BM in the last 6 days (she has chronic constipation).    Subjective:    [name] today has, No headache, No chest pain, No abdominal pain - No Nausea, No new weakness tingling or numbness, No Cough - mildly improved SOB this am.     Assessment  & Plan :    1. Acute on chronic diastolic CHF with last EF of 45%. Has already shown improvement with IV Lasix which will be dropped to once a day on 05/28/2016, on low-dose beta blocker, continue fluid and salt restriction. Her EF has remarkably dropped from 60% to 45% in the last few years with wall motion abnormality, Seen by cardiology no further workup this admission, Still has rales on exam & still mildly  SOB, continue on IV Lasix with Zaroxolyn, - 8 lits.  Filed Weights   05/29/16 0415 05/30/16 0440 05/31/16 0512  Weight: 75.2 kg (165 lb 12.6 oz) 75.2 kg (165 lb 12.6 oz) 72.1 kg (158 lb 14.4 oz)    2.Community-acquired pneumonia. On Levaquin. Afebrile, will check speech  evaluation to rule out aspiration in the setting of Parkinson's, if remains afebrile without leukocytosis , Levaquin total 3 days then stop. Needs 2 view CXR in 1 week.  3. History of Parkinson's. Continue home regimen, has significant resting tremors. PT eval, may require placement.  4. Chronic constipation. Placed on bowel regimen.  5. Essential hypertension. Continue monitoring on beta blocker and diuretics, blood pressure drops with ACE inhibitor.   Family Communication  :  none  Code Status :  Full  Diet : Heart Healthy  Disposition Plan  :  [name]   Consults  :  Cards  Procedures  :     TTE  - Left ventricle: The cavity size was normal. Wall thickness was increased in a pattern of mild LVH. Systolic function was mildly to moderately reduced. The estimated ejection fraction was in the range of 40% to 45%. Diffuse hypokinesis. Features are consistent with a pseudonormal left ventricular filling pattern, with concomitant abnormal relaxation and increased filling pressure (grade 2 diastolic dysfunction). Doppler parameters are consistent with high ventricular filling pressure. - Aortic valve: Moderately calcified annulus. Trileaflet; mildly calcified leaflets. 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 There was trivial regurgitation. Peak velocity (S): 232 cm/s. Mean gradient (S): 11 mm Hg. Valve area (VTI): 0.68 cm^2. Valve area (Vmax): 0.7 cm^2. Valve area (Vmean): 0.83 cm^2. - Mitral valve: Calcified annulus. - Left atrium: The atrium was severely dilated. - Right ventricle: Systolic function was mildly reduced. - Right atrium: The atrium was mildly to moderately dilated. - Atrial septum: There was a patent foramen  ovale. - Tricuspid valve: There was mild-moderate regurgitation.   DVT Prophylaxis  :   Heparin    Lab Results  Component Value Date   PLT 150 05/26/2016    Inpatient Medications  Scheduled Meds: . artificial tears   Both Eyes Daily  . aspirin EC  81 mg Oral Daily  . bisacodyl  10 mg Rectal Daily  . carbidopa-levodopa  1 tablet Oral 3 times per day  . carbidopa-levodopa  1.5 tablet Oral 3 times per day  . cholecalciferol  2,000 Units Oral Daily  . ferrous sulfate  325 mg Oral Daily  . furosemide  80 mg Intravenous BID  . guaiFENesin  600 mg Oral BID  . heparin  5,000 Units Subcutaneous Q8H  . ipratropium-albuterol  3 mL Nebulization BID  . latanoprost  1 drop Left Eye q1800  . metolazone  2.5 mg Oral Once  . metoprolol tartrate  12.5 mg Oral BID  . multivitamin with minerals  1 tablet Oral Daily  . polyethylene glycol  17 g Oral BID  . pramipexole  1 mg Oral TID  . senna  1 tablet Oral BID   Continuous Infusions:  PRN Meds:.acetaminophen **OR** [DISCONTINUED] acetaminophen, albuterol, guaiFENesin-dextromethorphan, LORazepam, [DISCONTINUED] ondansetron **OR** ondansetron (ZOFRAN) IV, traZODone  Antibiotics  :    Anti-infectives    Start     Dose/Rate Route Frequency Ordered Stop   05/27/16 1000  levofloxacin (LEVAQUIN) tablet 750 mg     750 mg Oral Daily 05/26/16 1221 05/27/16 0954   05/26/16 1000  levofloxacin (LEVAQUIN) IVPB 750 mg  Status:  Discontinued     750 mg 100 mL/hr over 90 Minutes Intravenous Every 24 hours 05/26/16 0913 05/26/16 1221   05/26/16 0900  levofloxacin (LEVAQUIN) IVPB 750 mg  Status:  Discontinued     750 mg 100 mL/hr over 90 Minutes Intravenous Every 24 hours 05/25/16 1231 05/26/16 0913   05/25/16 0845  levofloxacin (LEVAQUIN) IVPB 750 mg     750 mg 100 mL/hr over 90 Minutes Intravenous  Once 05/25/16 0845 05/25/16 1037         Objective:   Vitals:   05/30/16 1250 05/30/16 1947 05/30/16 2118 05/31/16 0512  BP: (!) 126/58 102/67  (!)  112/50  Pulse: 68 85  62  Resp: 18 18  18   Temp: 97.8 F (36.6 C) 97.5 F (36.4 C)  98.4 F (36.9 C)  TempSrc: Oral Oral  Oral  SpO2: 99% 95% 96% 94%  Weight:    72.1 kg (158 lb 14.4 oz)  Height:        Wt Readings from Last 3 Encounters:  05/31/16 72.1 kg (158 lb 14.4 oz)  05/16/16 78.9 kg (174 lb)  04/02/16 77.1 kg (170 lb)     Intake/Output Summary (Last 24 hours) at 05/31/16 0943 Last data filed at 05/31/16 0845  Gross per 24 hour  Intake              840 ml  Output             2450 ml  Net            -1610 ml     Physical Exam  Awake Alert, Oriented X 2, No new F.N deficits, Normal affect, Generalized tremors all over [place].AT,PERRAL Supple Neck,No JVD, No cervical lymphadenopathy appriciated.  Symmetrical Chest wall movement, Good air movement bilaterally, ++ rales RRR,No Gallops,Rubs or new Murmurs, No Parasternal Heave +ve B.Sounds, Abd Soft, No tenderness, No organomegaly appriciated, No rebound - guarding or rigidity. No Cyanosis, Clubbing or edema, No new Rash or bruise      Data Review:    CBC  Recent Labs Lab 05/25/16 0635 05/26/16 0245  WBC 7.4 7.4  HGB 9.1* 10.3*  HCT 30.0* 34.3*  PLT 182 150  MCV 107.1* 77.1*  MCH 32.5 23.1*  MCHC 30.3 30.0  RDW 16.8* 17.6*    Chemistries   Recent Labs Lab 05/25/16 0635 05/26/16 0245 05/27/16 0316 05/29/16 0840 05/31/16 0325  NA 139 141 139 137 138  K 4.0 4.0 4.1 3.6 4.1  CL 107 104 102 100* 97*  CO2 25 31 32 25 29  GLUCOSE 93 90 93 158* 131*  BUN 16 16 23* 28* 38*  CREATININE 0.76 0.90 1.00 1.06* 1.15*  CALCIUM 9.1 8.8* 9.1 8.8* 9.3  MG  --   --  2.2 2.5* 2.6*   ------------------------------------------------------------------------------------------------------------------ No results for input(s): CHOL, HDL, LDLCALC, TRIG, CHOLHDL, LDLDIRECT in the last 72 hours.  No results found for:  HGBA1C ------------------------------------------------------------------------------------------------------------------ No results for input(s): TSH, T4TOTAL, T3FREE, THYROIDAB in the last 72 hours.  Invalid input(s): FREET3 ------------------------------------------------------------------------------------------------------------------ No results for input(s): VITAMINB12, FOLATE, FERRITIN, TIBC, IRON, RETICCTPCT in the last 72 hours.  Coagulation profile No results for input(s): INR, PROTIME in the last 168 hours.  No results for input(s): DDIMER in the last 72 hours.  Cardiac Enzymes  Recent Labs Lab 05/25/16 1134 05/25/16 1516 05/25/16 2058  TROPONINI <0.03 <0.03 <0.03   ------------------------------------------------------------------------------------------------------------------    Component Value Date/Time   BNP 294.0 (H) 05/25/2016 [id number]    Micro Results No results found for this or any previous visit (from the past 240 hour(s)).  Radiology Reports  Dg Chest 2 View  Result Date: 05/31/2016 CLINICAL DATA:  Sob for 5 days EXAM: CHEST  2 VIEW COMPARISON:  05/29/2016 FINDINGS: Lateral view degraded by patient arm position. Thoracolumbar vertebral augmentation. Patient rotated right on  the frontal radiograph. Midline trachea. Moderate cardiomegaly. Moderate to large hiatal hernia. No pleural effusion or pneumothorax. Chronic interstitial thickening. Left base scarring. Vague increased density projecting over the right upper lobe on the frontal radiograph could be due to EKG lead artifact. IMPRESSION: Artifact versus right upper lobe airspace disease. Consider repeat radiograph after removal of EKG leads. Cardiomegaly with left base scarring.  No congestive failure. Hiatal hernia. Electronically Signed   By: Abigail Miyamoto M.D.   On: 05/31/2016 09:24   Dg Chest 2 View  Result Date: 05/25/2016 CLINICAL DATA:  Central chest pain since this morning EXAM: CHEST  2 VIEW  COMPARISON:  August 30, 2015 FINDINGS: The heart size is enlarged. Mediastinal contour is normal. There is increased pulmonary interstitium bilaterally. Patchy opacity is identified in the left lung base. There is no pleural effusion. There is a hiatal hernia. The bony structures are stable. IMPRESSION: Cardiomegaly.  Interstitial edema. Patchy consolidation of left lung base pneumonia is not excluded. Electronically Signed   By: Abelardo Diesel M.D.   On: 05/25/2016 07:35   Dg Chest Port 1 View  Result Date: 05/29/2016 CLINICAL DATA:  Shortness of breath for 1 day EXAM: PORTABLE CHEST 1 VIEW COMPARISON:  May 25, 2016 FINDINGS: The degree of inspiration is shallow. There is atelectatic change in the left base. The lungs elsewhere clear. Heart is mildly prominent with pulmonary vascularity within normal limits. No adenopathy. There is degenerative change in each shoulder. No pneumothorax but IMPRESSION: No base atelectasis. Lungs elsewhere clear. Stable cardiac silhouette. Note shallow degree of inspiration. Electronically Signed   By: Lowella Grip III M.D.   On: 05/29/2016 08:26    Time Spent in minutes  30   SINGH,PRASHANT K M.D on 05/31/2016 at 9:43 AM  Between 7am to 7pm - Pager - [phone]  After 7pm go to [email or web address] - password [facility]  [facility] -  Office  [phone]

## 2016-05-31 NOTE — Patient Outreach (Signed)
[facility]) Care Management  05/31/2016  [name] [birth year] [id number]   Assessment- Patient remains in the hospital. CSW completed outreach call just to leave a HIPPA compliant voice message with CSW informing her that this CSW will be out of the office from 06/03/16 to 06/12/16. CSW also included that [facility] CSW will be providing coverage during that time if she needs anything. CSW discussed case with [facility] RNCM.  Plan-CSW will follow up within 30 days.  [name], BSW, MSW, [email or web address] Phone: [phone] Fax: [phone]

## 2016-06-01 ENCOUNTER — Inpatient Hospital Stay (HOSPITAL_COMMUNITY): Payer: Medicare Other

## 2016-06-01 MED ORDER — FUROSEMIDE 40 MG PO TABS: 40.0000 mg | ORAL_TABLET | Freq: Two times a day (BID) | ORAL | 0 refills | Status: DC

## 2016-06-01 MED ORDER — LISINOPRIL 2.5 MG PO TABS: 2.5000 mg | ORAL_TABLET | Freq: Every day | ORAL | Status: DC

## 2016-06-01 MED ORDER — LISINOPRIL 2.5 MG PO TABS: 2.5000 mg | ORAL_TABLET | Freq: Every day | ORAL | 0 refills | Status: DC

## 2016-06-01 MED ORDER — ASPIRIN 81 MG PO TBEC: 81.0000 mg | DELAYED_RELEASE_TABLET | Freq: Every day | ORAL | 0 refills | Status: DC

## 2016-06-01 MED ORDER — METOLAZONE 5 MG PO TABS
5.0000 mg | ORAL_TABLET | Freq: Once | ORAL | Status: AC
  Administered 2016-06-01: 5 mg via ORAL
  Filled 2016-06-01: qty 1

## 2016-06-01 MED ORDER — METOPROLOL TARTRATE 25 MG PO TABS: 12.5000 mg | ORAL_TABLET | Freq: Two times a day (BID) | ORAL | 0 refills | Status: DC

## 2016-06-01 NOTE — Discharge Summary (Signed)
[name] [id number] DOB: [birth year] DOA: 05/25/2016  PCP: Hollace Kinnier, DO  Admit date: 05/25/2016  Discharge date: 06/01/2016  Admitted From: home   Disposition:  home   Recommendations for Outpatient Follow-up:   Follow up with PCP in 1-2 weeks  PCP Please obtain BMP/CBC, 2 view CXR in 1week,  (see Discharge instructions)   PCP Please follow up on the following pending results: None   Home Health: HHPT-RN Equipment/Devices: none  Consultations: cards Discharge Condition: Fair   CODE STATUS: Full   Diet Recommendation: Healthy, 1.5 L total fluid restriction   Chief Complaint  Patient presents with  . Chest Pain     Brief history of present illness from the day of admission and additional interim summary    [name] a 75 y.o.female,With past medical history relevant for Parkinson'sdisease and CHF who presents with cough and shortness of breath for almost 3 days now. Patient apparently quit taking Lasix couple days ago because she did not want to have to walk to the bathroom. Cough is with white to slightly yellow sputum, shortness of breath worsened over the last 12 hours, no significant leg swelling no pleuritic symptoms. Patient complains of slight chest tightness.No fevers or chills. She does have some disorganization, no orthopnea or paroxysmal nocturnal dyspnea. Patient has a caregiver that checks on her daily. No vomiting or diarrhea in fact no BM in the last 6 days (she has chronic constipation).  Hospital issues addressed     1. Acute on chronic diastolic CHF with last EF of 45%. Has already shown improvement with IV Lasix which will be dropped to once a day on 05/28/2016, on low-dose beta blocker, continue fluid and salt restriction. Her EF has remarkably dropped from 60% to 45% in the  last few years with wall motion abnormality, Seen by cardiology no further workup this admission, She today has no shortness of breath and feels close to baseline, will be discharged home with Lasix dose being doubled from 40 once a day to twice a day, we will add low-dose beta blocker and his inhibitor as tolerated by blood pressure, follow-up with PCP and cardiology in the outpatient setting, patient has been counseled on compliance Lasix compliance has been an issue in the past. Home health PT and RN also ordered. She is so far - 10 lits.  Request PCP to monitor weight, BMP, diuretic dose closely. She will also need a 2 view chest x-ray in one week.   Filed Weights   05/30/16 0440 05/31/16 0512 06/01/16 0424  Weight: 75.2 kg (165 lb 12.6 oz) 72.1 kg (158 lb 14.4 oz) 69.7 kg (153 lb 9.6 oz)     2.Community-acquired pneumonia. On Levaquin. Afebrile, will check speech evaluation to rule out aspiration in the setting of Parkinson's, She remained afebrile without leukocytosis , Levaquin total 3 days then stop. Needs 2 view CXR in 1 week.Clinically resolved.  3. History of Parkinson's. Continue home regimen, has significant resting tremors. PT eval, may require placement.  4. Chronic constipation. Placed  on bowel regimen.  5. Essential hypertension. Continue monitoring on low-dose beta blocker and diuretics, lowest dose his inhibitor as blood pressure is soft.   Discharge diagnosis     Principal Problem:   CHF exacerbation ([place]) Active Problems:   Parkinson disease ([place])   Essential hypertension   CAP (community acquired pneumonia)    Chronic constipation   Pneumonia   Acute on chronic systolic congestive heart failure [facility])    Discharge instructions    Discharge Instructions    Discharge instructions    Complete by:  As directed   Follow with Primary MD REED, [name], DO in 3-4 days   Get CBC, CMP, 2 view Chest X ray checked  by Primary MD or [name] MD in 5-7 days ( we  routinely change or add medications that can affect your baseline labs and fluid status, therefore we recommend that you get the mentioned basic workup next visit with your PCP, your PCP may decide not to get them or add new tests based on their clinical decision)   Activity: As tolerated with Full fall precautions use walker/cane & assistance as needed   Disposition Home    Diet:   Heart Healthy  Check your Weight same time everyday, if you gain over 2 pounds, or you develop in leg swelling, experience more shortness of breath or chest pain, call your Primary MD immediately. Follow Cardiac Low Salt Diet and 1.5 lit/day fluid restriction.   On your next visit with your primary care physician please Get Medicines reviewed and adjusted.   Please request your Prim.MD to go over all Hospital Tests and Procedure/Radiological results at the follow up, please get all Hospital records sent to your Prim MD by signing hospital release before you go home.   If you experience worsening of your admission symptoms, develop shortness of breath, life threatening emergency, suicidal or homicidal thoughts you must seek medical attention immediately by calling 911 or calling your MD immediately  if symptoms less severe.  You Must read complete instructions/literature along with all the possible adverse reactions/side effects for all the Medicines you take and that have been prescribed to you. Take any new Medicines after you have completely understood and accpet all the possible adverse reactions/side effects.   Do not drive, operate heavy machinery, perform activities at heights, swimming or participation in water activities or provide baby sitting services if your were admitted for syncope or siezures until you have seen by Primary MD or a Neurologist and advised to do so again.  Do not drive when taking Pain medications.    Do not take more than prescribed Pain, Sleep and Anxiety Medications  Special  Instructions: If you have smoked or chewed Tobacco  in the last 2 yrs please stop smoking, stop any regular Alcohol  and or any Recreational drug use.  Wear Seat belts while driving.   Please note  You were cared for by a hospitalist during your hospital stay. If you have any questions about your discharge medications or the care you received while you were in the hospital after you are discharged, you can call the unit and asked to speak with the hospitalist on call if the hospitalist that took care of you is not available. Once you are discharged, your primary care physician will handle any further medical issues. Please note that NO REFILLS for any discharge medications will be authorized once you are discharged, as it is imperative that you return to your primary care physician (or  establish a relationship with a primary care physician if you do not have one) for your aftercare needs so that they can reassess your need for medications and monitor your lab values.   Increase activity slowly    Complete by:  As directed      Discharge Medications     Medication List    TAKE these medications   aspirin 81 MG EC tablet Take 1 tablet (81 mg total) by mouth daily.   carbidopa-levodopa 25-100 MG tablet Commonly known as:  SINEMET IR Take 1 at 6a, 1 1/2 pills at 9a, 1 at noon, 1 1/2 pills at 3p, 1 at 6pm, 1 1/2 pills at 9pm   docusate sodium 100 MG capsule Commonly known as:  COLACE Take 100 mg by mouth daily. Reported on 02/28/2016   ferrous sulfate 325 (65 FE) MG EC tablet Take 1 tablet (325 mg total) by mouth daily.   furosemide 40 MG tablet Commonly known as:  LASIX Take 1 tablet (40 mg total) by mouth 2 (two) times daily. What changed:  when to take this   latanoprost 0.005 % ophthalmic solution Commonly known as:  XALATAN Place 1 drop into the left eye daily at 6 PM.   lisinopril 2.5 MG tablet Commonly known as:  PRINIVIL,ZESTRIL Take 1 tablet (2.5 mg total) by mouth  daily. Start taking on:  06/02/2016   LORazepam 0.5 MG tablet Commonly known as:  ATIVAN Take 1 tablet (0.5 mg total) by mouth 2 (two) times daily as needed for anxiety.   metoprolol tartrate 25 MG tablet Commonly known as:  LOPRESSOR Take 0.5 tablets (12.5 mg total) by mouth 2 (two) times daily.   multivitamin with minerals tablet Take 1 tablet by mouth daily.   nystatin cream Commonly known as:  MYCOSTATIN Apply 1 application topically 2 (two) times daily.   oxyCODONE 5 MG immediate release tablet Commonly known as:  Oxy IR/ROXICODONE Take 1 tablet (5 mg total) by mouth 2 (two) times daily as needed for severe pain.   pramipexole 1 MG tablet Commonly known as:  MIRAPEX Take 1 tablet (1 mg total) by mouth 3 (three) times daily.   SYSTANE OP Place 1 drop into both eyes daily.   triamcinolone cream 0.1 % Commonly known as:  KENALOG Apply 1 application topically 2 (two) times daily.   Vitamin D 2000 units Caps Take 2,000 Units by mouth daily. Reported on 04/10/2016       Follow-up Information    [facility] .   Why:  Home Health RN, Physical Therapy and aide Contact information: [place] [address] [phone]        REED, [name], DO. Schedule an appointment as soon as possible for a visit in 3 day(s).   Specialty:  Geriatric Medicine Contact information: [place]. [place] [address] [phone]        Jenkins Rouge, MD. Schedule an appointment as soon as possible for a visit in 1 week(s).   Specialty:  Cardiology Contact information: [id number] [place] [address] [phone]           Major procedures and Radiology Reports - PLEASE review detailed and final reports thoroughly  -      TTE  - Left ventricle: The cavity size was normal. Wall thickness wasincreased in a pattern of mild LVH. Systolic function was mildlyto moderately reduced. The estimated ejection fraction  was in therange of 40% to 45%. Diffuse  hypokinesis. Features are consistentwith a pseudonormal left ventricular filling pattern, withconcomitant abnormal relaxation and increased filling pressure(grade 2 diastolic dysfunction). Doppler parameters areconsistent with high ventricular filling pressure. - Aortic valve: Moderately calcified annulus. Trileaflet; mildlycalcified leaflets. 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 Therewas trivial regurgitation. Peak velocity (S): 232 cm/s. Meangradient (S): 11 mm Hg. Valve area (VTI): 0.68 cm^2. Valve area(Vmax): 0.7 cm^2. Valve area (Vmean): 0.83 cm^2. - Mitral valve: Calcified annulus. - Left atrium: The atrium was severely dilated. - Right ventricle: Systolic function was mildly reduced. - Right atrium: The atrium was mildly to moderately dilated. - Atrial septum: There was a patent foramen ovale. - Tricuspid valve: There was mild-moderate regurgitation.  Dg Chest 2 View  Result Date: 05/31/2016 CLINICAL DATA:  Sob for 5 days EXAM: CHEST  2 VIEW COMPARISON:  05/29/2016 FINDINGS: Lateral view degraded by patient arm position. Thoracolumbar vertebral augmentation. Patient rotated right on the frontal radiograph. Midline trachea. Moderate cardiomegaly. Moderate to large hiatal hernia. No pleural effusion or pneumothorax. Chronic interstitial thickening. Left base scarring. Vague increased density projecting over the right upper lobe on the frontal radiograph could be due to EKG lead artifact. IMPRESSION: Artifact versus right upper lobe airspace disease. Consider repeat radiograph after removal of EKG leads. Cardiomegaly with left base scarring.  No congestive failure. Hiatal hernia. Electronically Signed   By: Abigail Miyamoto M.D.   On: 05/31/2016 09:24   Dg Chest 2 View  Result Date: 05/25/2016 CLINICAL DATA:  Central chest pain since this morning EXAM: CHEST  2 VIEW COMPARISON:  August 30, 2015 FINDINGS: The heart size is enlarged. Mediastinal contour is  normal. There is increased pulmonary interstitium bilaterally. Patchy opacity is identified in the left lung base. There is no pleural effusion. There is a hiatal hernia. The bony structures are stable. IMPRESSION: Cardiomegaly.  Interstitial edema. Patchy consolidation of left lung base pneumonia is not excluded. Electronically Signed   By: Abelardo Diesel M.D.   On: 05/25/2016 07:35   Dg Chest Port 1 View  Result Date: 05/29/2016 CLINICAL DATA:  Shortness of breath for 1 day EXAM: PORTABLE CHEST 1 VIEW COMPARISON:  May 25, 2016 FINDINGS: The degree of inspiration is shallow. There is atelectatic change in the left base. The lungs elsewhere clear. Heart is mildly prominent with pulmonary vascularity within normal limits. No adenopathy. There is degenerative change in each shoulder. No pneumothorax but IMPRESSION: No base atelectasis. Lungs elsewhere clear. Stable cardiac silhouette. Note shallow degree of inspiration. Electronically Signed   By: Lowella Grip III M.D.   On: 05/29/2016 08:26    Micro Results     No results found for this or any previous visit (from the past 240 hour(s)).  Today   Subjective    [name] today has no headache,no chest abdominal pain,no new weakness tingling or numbness, feels much better wants to go home today.     Objective   Blood pressure (!) 103/49, pulse 70, temperature 98 F (36.7 C), temperature source Oral, resp. rate 17, height 5\' 4"  (1.626 m), weight 69.7 kg (153 lb 9.6 oz), SpO2 96 %.   Intake/Output Summary (Last 24 hours) at 06/01/16 0852 Last data filed at 06/01/16 0609  Gross per 24 hour  Intake              480 ml  Output             2025 ml  Net            -1545  ml    Exam Awake Alert, Oriented x 3, No new F.N deficits, Normal affect [place].AT,PERRAL Supple Neck,No JVD, No cervical lymphadenopathy appriciated.  Symmetrical Chest wall movement, Good air movement bilaterally, minimal rales RRR,No Gallops,Rubs or new Murmurs, No  Parasternal Heave +ve B.Sounds, Abd Soft, Non tender, No organomegaly appriciated, No rebound -guarding or rigidity. No Cyanosis, Clubbing or edema, No new Rash or bruise   Data Review   CBC w Diff: Lab Results  Component Value Date   WBC 7.4 05/26/2016   HGB 10.3 (L) 05/26/2016   HCT 34.3 (L) 05/26/2016   HCT 34.4 02/15/2016   PLT 150 05/26/2016   PLT 185 02/15/2016   LYMPHOPCT 18 03/11/2016   BANDSPCT 0 12/07/2014   MONOPCT 7 03/11/2016   EOSPCT 4 03/11/2016   BASOPCT 1 03/11/2016    CMP: Lab Results  Component Value Date   NA 138 05/31/2016   NA 142 02/15/2016   K 4.1 05/31/2016   CL 97 (L) 05/31/2016   CO2 29 05/31/2016   BUN 38 (H) 05/31/2016   BUN 21 02/15/2016   CREATININE 1.15 (H) 05/31/2016   PROT 6.2 (L) 04/27/2015   PROT 5.8 (L) 04/03/2015   ALBUMIN 3.4 (L) 04/27/2015   ALBUMIN 3.8 04/03/2015   BILITOT 0.8 04/27/2015   BILITOT 0.4 04/03/2015   ALKPHOS 67 04/27/2015   AST 24 04/27/2015   ALT 24 04/27/2015  .   Total Time in preparing paper work, data evaluation and todays exam - 35 minutes  Thurnell Lose M.D on 06/01/2016 at 8:52 AM  [facility]   Office  [phone]

## 2016-06-01 NOTE — Discharge Instructions (Signed)
Follow with Primary MD REED, [name], DO in 3-4 days   Get CBC, CMP, 2 view Chest X ray checked  by Primary MD or [name] MD in 5-7 days ( we routinely change or add medications that can affect your baseline labs and fluid status, therefore we recommend that you get the mentioned basic workup next visit with your PCP, your PCP may decide not to get them or add new tests based on their clinical decision)   Activity: As tolerated with Full fall precautions use walker/cane & assistance as needed   Disposition Home    Diet:   Heart Healthy  Check your Weight same time everyday, if you gain over 2 pounds, or you develop in leg swelling, experience more shortness of breath or chest pain, call your Primary MD immediately. Follow Cardiac Low Salt Diet and 1.5 lit/day fluid restriction.   On your next visit with your primary care physician please Get Medicines reviewed and adjusted.   Please request your Prim.MD to go over all Hospital Tests and Procedure/Radiological results at the follow up, please get all Hospital records sent to your Prim MD by signing hospital release before you go home.   If you experience worsening of your admission symptoms, develop shortness of breath, life threatening emergency, suicidal or homicidal thoughts you must seek medical attention immediately by calling 911 or calling your MD immediately  if symptoms less severe.  You Must read complete instructions/literature along with all the possible adverse reactions/side effects for all the Medicines you take and that have been prescribed to you. Take any new Medicines after you have completely understood and accpet all the possible adverse reactions/side effects.   Do not drive, operate heavy machinery, perform activities at heights, swimming or participation in water activities or provide baby sitting services if your were admitted for syncope or siezures until you have seen by Primary MD or a Neurologist and advised to do so  again.  Do not drive when taking Pain medications.    Do not take more than prescribed Pain, Sleep and Anxiety Medications  Special Instructions: If you have smoked or chewed Tobacco  in the last 2 yrs please stop smoking, stop any regular Alcohol  and or any Recreational drug use.  Wear Seat belts while driving.   Please note  You were cared for by a hospitalist during your hospital stay. If you have any questions about your discharge medications or the care you received while you were in the hospital after you are discharged, you can call the unit and asked to speak with the hospitalist on call if the hospitalist that took care of you is not available. Once you are discharged, your primary care physician will handle any further medical issues. Please note that NO REFILLS for any discharge medications will be authorized once you are discharged, as it is imperative that you return to your primary care physician (or establish a relationship with a primary care physician if you do not have one) for your aftercare needs so that they can reassess your need for medications and monitor your lab values.

## 2016-06-01 NOTE — Progress Notes (Signed)
Patient is discharge to home accompanied by patient's brother  and NT via wheelchair. Discharge instructions given . Patient verbalizes understanding. All personal belongings given. Telemetry box and IV removed prior to discharge and site in good condition.

## 2016-06-03 ENCOUNTER — Other Ambulatory Visit: Payer: Self-pay | Admitting: *Deleted

## 2016-06-03 NOTE — Patient Outreach (Addendum)
Transition of care call successful as this RN CM covering for [employer] community nurse case Freight forwarder.  Recent hospitalization 9/2-9/9 HF exacerbation.  Spoke with pt, HIPAA verified, informed pt this RN CM covering for primary community nurse case Freight forwarder.  Pt reports doing good, swelling in legs/ankles going down.  Pt reports she has not been weighing, have to get a battery for her scale.  RN CM discussed with pt  importance of weighing  to which pt to get battery soon.  Pt reports to see Dr. Mariea Clonts 9/14.  Discussed with pt importance of taking Lasix as ordered, adherence to 1.5 liter fluid restriction.   Patient was recently discharged from hospital and all medications have been reviewed. Pt does not have Aspirin 81 mg- need to get.   Discussed with pt Richarda Osmond will f/u with her next week- part of ongoing transition of care.    Plan to provide update to Anice Paganini community nurse case Freight forwarder.     Zara Chess.   [facility]  [phone]

## 2016-06-06 ENCOUNTER — Ambulatory Visit (INDEPENDENT_AMBULATORY_CARE_PROVIDER_SITE_OTHER): Payer: Medicare Other | Admitting: Internal Medicine

## 2016-06-06 ENCOUNTER — Encounter: Payer: Self-pay | Admitting: Internal Medicine

## 2016-06-06 VITALS — BP 100/60 | HR 73 | Temp 98.1°F | Wt 161.0 lb

## 2016-06-06 DIAGNOSIS — I8311 Varicose veins of right lower extremity with inflammation: Secondary | ICD-10-CM

## 2016-06-06 DIAGNOSIS — G20 Parkinson's disease: Secondary | ICD-10-CM | POA: Diagnosis not present

## 2016-06-06 DIAGNOSIS — I8312 Varicose veins of left lower extremity with inflammation: Secondary | ICD-10-CM

## 2016-06-06 DIAGNOSIS — D509 Iron deficiency anemia, unspecified: Secondary | ICD-10-CM | POA: Diagnosis not present

## 2016-06-06 DIAGNOSIS — Z23 Encounter for immunization: Secondary | ICD-10-CM

## 2016-06-06 DIAGNOSIS — J189 Pneumonia, unspecified organism: Secondary | ICD-10-CM

## 2016-06-06 DIAGNOSIS — I872 Venous insufficiency (chronic) (peripheral): Secondary | ICD-10-CM

## 2016-06-06 DIAGNOSIS — I5023 Acute on chronic systolic (congestive) heart failure: Secondary | ICD-10-CM

## 2016-06-06 DIAGNOSIS — Q2112 Patent foramen ovale: Secondary | ICD-10-CM

## 2016-06-06 DIAGNOSIS — I10 Essential (primary) hypertension: Secondary | ICD-10-CM | POA: Diagnosis not present

## 2016-06-06 DIAGNOSIS — Q211 Atrial septal defect: Secondary | ICD-10-CM

## 2016-06-06 DIAGNOSIS — G20A1 Parkinson's disease without dyskinesia, without mention of fluctuations: Secondary | ICD-10-CM

## 2016-06-06 LAB — COMPREHENSIVE METABOLIC PANEL
ALT: 7 U/L (ref 6–29)
AST: 16 U/L (ref 10–35)
Albumin: 3.7 g/dL (ref 3.6–5.1)
Alkaline Phosphatase: 66 U/L (ref 33–130)
BUN: 95 mg/dL — ABNORMAL HIGH (ref 7–25)
CO2: 26 mmol/L (ref 20–31)
Calcium: 8.8 mg/dL (ref 8.6–10.4)
Chloride: 97 mmol/L — ABNORMAL LOW (ref 98–110)
Creat: 1.68 mg/dL — ABNORMAL HIGH (ref 0.60–0.93)
Glucose, Bld: 116 mg/dL — ABNORMAL HIGH (ref 65–99)
Potassium: 3.9 mmol/L (ref 3.5–5.3)
Sodium: 135 mmol/L (ref 135–146)
Total Bilirubin: 0.5 mg/dL (ref 0.2–1.2)
Total Protein: 6.4 g/dL (ref 6.1–8.1)

## 2016-06-06 LAB — CBC WITH DIFFERENTIAL/PLATELET
Basophils Absolute: 0 cells/uL (ref 0–200)
Basophils Relative: 0 %
Eosinophils Absolute: 348 cells/uL (ref 15–500)
Eosinophils Relative: 3 %
HCT: 32.2 % — ABNORMAL LOW (ref 35.0–45.0)
Hemoglobin: 10.5 g/dL — ABNORMAL LOW (ref 11.7–15.5)
Lymphocytes Relative: 8 %
Lymphs Abs: 928 cells/uL (ref 850–3900)
MCH: 24 pg — ABNORMAL LOW (ref 27.0–33.0)
MCHC: 32.6 g/dL (ref 32.0–36.0)
MCV: 73.5 fL — ABNORMAL LOW (ref 80.0–100.0)
Monocytes Absolute: 696 cells/uL (ref 200–950)
Monocytes Relative: 6 %
Neutro Abs: 9628 cells/uL — ABNORMAL HIGH (ref 1500–7800)
Neutrophils Relative %: 83 %
Platelets: 167 10*3/uL (ref 140–400)
RBC: 4.38 MIL/uL (ref 3.80–5.10)
RDW: 19.3 % — ABNORMAL HIGH (ref 11.0–15.0)
WBC: 11.6 10*3/uL — ABNORMAL HIGH (ref 3.8–10.8)

## 2016-06-06 NOTE — Progress Notes (Signed)
Location:  [facility] clinic Provider: Lemoine Goyne L. Mariea Clonts, D.O., C.M.D.  Code Status: DNR Goals of Care:  Advanced Directives 06/06/2016  Does patient have an advance directive? Yes  Type of Advance Directive Out of facility DNR (pink MOST or yellow form)  Does patient want to make changes to advanced directive? -  Copy of advanced directive(s) in chart? Yes  Would patient like information on creating an advanced directive? -  Pre-existing out of facility DNR order (yellow form or pink MOST form) Pink MOST form placed in chart (order not valid for inpatient use)   Chief Complaint  Patient presents with  . Hospitalization Follow-up    CHF  . transitional care    [name] seen today for hospital follow-up s/p admission from 9/2-9/9.  She had woken up at 2am, she had chest pain, she called EMS.  She was found to be in acute on chronic systolic chf.  She was also said to have had pneumonia, but had no wbc count, no fever.  Still was sob walking in here.  Echo showed mild to moderate systolic dysfunction ([id number] EF) and grade 2 diastolic dysfunction. It also says she has a patent foramen ovale and severe atrial dilatation.  ASA 81mg , lisinopril 2.5mg  and metoprolol tartrate 12.5mg  po bid were started and lasix is now 40mg  po bid.  She is to have f/u cbc, cmp and CXR today.  She is following her fluid (1500cc) restriction and her sodium restriction (2g).  BP on low side today.    Pt looks great--getting 3 hours of caregiving 5 days a week.  Pt says she was told she couldn't get PT from advanced home care.    Past Medical History:  Diagnosis Date  . Acute on chronic systolic heart failure ([place])   . Anemia   . Anxiety   . Arthritis   . Cataract left  . Cellulitis and abscess of leg, except foot   . Cellulitis of lower leg 12/24/2011  . CHF (congestive heart failure) ([place])   . Chronic bronchitis ([place])    "usually get it q yr"  . Chronic systolic heart failure ([place])   .  GERD (gastroesophageal reflux disease)   . Glaucoma left  . Helicobacter pylori (H. pylori)   . History of bleeding peptic ulcer   . History of blood transfusion    "don't remember why"  . Hypercholesterolemia   . Hypertension   . Hypotension, unspecified   . Hypothyroidism   . Parkinson's disease   . Pneumonia   . Sleep apnea    "had test years ago; insurance wouldn't pay for mask so I never had one" (04/27/2015)  . Stroke [facility]) X 3   "that  was the reason I got Parkinson's"    Past Surgical History:  Procedure Laterality Date  . BACK SURGERY    . ESOPHAGOGASTRODUODENOSCOPY  09/26/2011   Procedure: ESOPHAGOGASTRODUODENOSCOPY (EGD);  Surgeon: Zenovia Jarred, MD;  Location[facility] ENDOSCOPY;  Service: Gastroenterology;  Laterality: N/A;  To be done at bedside.  . ESOPHAGOGASTRODUODENOSCOPY N/A 09/30/2013   Procedure: ESOPHAGOGASTRODUODENOSCOPY (EGD);  Surgeon: [name], MD;  Location: [place];  Service: Endoscopy;  Laterality: N/A;  . ESOPHAGOGASTRODUODENOSCOPY N/A 11/25/2013   Procedure: ESOPHAGOGASTRODUODENOSCOPY (EGD);  Surgeon: [name], MD;  Location: [facility] ENDOS[facility];  Service: Endoscopy;  Laterality: N/A;  . HEMIARTHROPLASTY HIP Right 09/24/2007   Archie Endo 01/23/2011  . KYPHOPLASTY  06/2009   T12/notes 07/17/2009  . NISSEN FUNDOPLICATION     "  had OR for GERD"  . TENDON REPAIR     Gluteus medius [facility]o 01/23/2011  . THYROIDECTOMY    . TONSILLECTOMY      Allergies  Allergen Reactions  . Codeine Other (See Comments)    Unknown allergic reaction  . Penicillins Other (See Comments)    Pt passed out in doctor's office after penicillin dose      Medication List       Accurate as of 06/06/16  8:40 AM. Always use your most recent med list.          aspirin EC 81 MG tablet Take 81 mg by mouth daily.   carbidopa-levodopa 25-100 MG tablet Commonly known as:  SINEMET IR Take 1 at 6a, 1 1/2 pills at 9a, 1 at noon, 1 1/2 pills at 3p, 1 at 6pm, 1 1/2 pills at 9pm   docusate  sodium 100 MG capsule Commonly known as:  COLACE Take 100 mg by mouth daily. Reported on 02/28/2016   ferrous sulfate 325 (65 FE) MG EC tablet Take 1 tablet (325 mg total) by mouth daily.   furosemide 40 MG tablet Commonly known as:  LASIX Take 1 tablet (40 mg total) by mouth 2 (two) times daily.   latanoprost 0.005 % ophthalmic solution Commonly known as:  XALATAN Place 1 drop into the left eye daily at 6 PM.   lisinopril 2.5 MG tablet Commonly known as:  PRINIVIL,ZESTRIL Take 1 tablet (2.5 mg total) by mouth daily.   LORazepam 0.5 MG tablet Commonly known as:  ATIVAN Take 1 tablet (0.5 mg total) by mouth 2 (two) times daily as needed for anxiety.   metoprolol tartrate 25 MG tablet Commonly known as:  LOPRESSOR Take 0.5 tablets (12.5 mg total) by mouth 2 (two) times daily.   multivitamin with minerals tablet Take 1 tablet by mouth daily.   nystatin cream Commonly known as:  MYCOSTATIN Apply 1 application topically 2 (two) times daily.   oxyCODONE 5 MG immediate release tablet Commonly known as:  Oxy IR/ROXICODONE Take 1 tablet (5 mg total) by mouth 2 (two) times daily as needed for severe pain.   pramipexole 1 MG tablet Commonly known as:  MIRAPEX Take 1 tablet (1 mg total) by mouth 3 (three) times daily.   SYSTANE OP Place 1 drop into both eyes daily.   triamcinolone cream 0.1 % Commonly known as:  KENALOG Apply 1 application topically 2 (two) times daily.   Vitamin D 2000 units Caps Take 2,000 Units by mouth daily. Reported on 04/10/2016       Review of Systems:  Review of Systems  Constitutional: Positive for malaise/fatigue. Negative for chills and fever.  HENT: Negative for congestion.   Eyes: Negative for blurred vision.  Respiratory: Positive for shortness of breath. Negative for cough and wheezing.   Cardiovascular: Negative for chest pain, palpitations and leg swelling.  Gastrointestinal: Negative for abdominal pain, blood in stool and melena.    Genitourinary: Positive for frequency and urgency. Negative for dysuria and hematuria.  Musculoskeletal: Negative for falls, joint pain and myalgias.       Pain in ankles where dorsiflexes; walks with rollator walker  Skin: Negative for itching and rash.       Chronic venous stasis dermatitis  Neurological: Positive for tremors and weakness. Negative for dizziness and loss of consciousness.  Endo/Heme/Allergies: Bruises/bleeds easily.  Psychiatric/Behavioral: Positive for memory loss. Negative for depression.       Some delusions    Health Maintenance  Topic  Date Due  . TETANUS/TDAP  06/[birth year]/1959  . ZOSTAVAX  06/06/2000  . DEXA SCAN  06/06/2005  . MAMMOGRAM  11/22/2011  . INFLUENZA VACCINE  04/23/2016  . PNA vac Low Risk Adult  Completed    Physical Exam: Vitals:   06/06/16 0827  BP: 100/60  Pulse: 73  Temp: 98.1 F (36.7 C)  TempSrc: Oral  SpO2: 96%  Weight: 161 lb (73 kg)   Body mass index is 27.64 kg/m. Physical Exam  Constitutional: She is oriented to person, place, and time. She appears well-developed. No distress.  HENT:  Head: Normocephalic and atraumatic.  Cardiovascular: Intact distal pulses.   irreg irreg, 2/6 systolic murmur  Pulmonary/Chest: Effort normal and breath sounds normal. She has no wheezes. She has no rales.  Is dyspneic on exertion  Abdominal: Soft. Bowel sounds are normal.  Musculoskeletal: Normal range of motion.  Walks with rollator walker  Neurological: She is alert and oriented to person, place, and time. She exhibits abnormal muscle tone.  Telling some story about how nurses cooked her breakfast and how her hospital room was near the kitchen (suspect they had a celebration and shared food with her?)  Skin: Skin is warm and dry. Capillary refill takes less than 2 seconds.  Pink stasis dermatitis of bilateral legs, some tenderness left greater than right, NOT cellulitis, does have dry scaly areas anteriorly, no edema now  Psychiatric: She  has a normal mood and affect.    Labs reviewed: Basic Metabolic Panel:  Recent Labs  05/25/16 1134  05/27/16 0316 05/29/16 0840 05/31/16 0325  NA  --   < > 139 137 138  K  --   < > 4.1 3.6 4.1  CL  --   < > 102 100* 97*  CO2  --   < > 32 25 29  GLUCOSE  --   < > 93 158* 131*  BUN  --   < > 23* 28* 38*  CREATININE  --   < > 1.00 1.06* 1.15*  CALCIUM  --   < > 9.1 8.8* 9.3  MG  --   --  2.2 2.5* 2.6*  TSH 0.762  --   --   --   --   < > = values in this interval not displayed. Liver Function Tests: No results for input(s): AST, ALT, ALKPHOS, BILITOT, PROT, ALBUMIN in the last 8760 hours. No results for input(s): LIPASE, AMYLASE in the last 8760 hours. No results for input(s): AMMONIA in the last 8760 hours. CBC:  Recent Labs  01/20/16 0036 02/15/16 1506 03/11/16 1522 05/25/16 0635 05/26/16 0245  WBC 8.1 7.8 7.5 7.4 7.4  NEUTROABS 6.0 5.8 5.3  --   --   HGB 10.2*  --  10.1* 9.1* 10.3*  HCT 33.3* 34.4 33.8* 30.0* 34.3*  MCV 80.8 79 81.8 107.1* 77.1*  PLT 147* 185 184 182 150    Procedures since last visit: Dg Chest 2 View  Result Date: 05/25/2016 CLINICAL DATA:  Central chest pain since this morning EXAM: CHEST  2 VIEW COMPARISON:  August 30, 2015 FINDINGS: The heart size is enlarged. Mediastinal contour is normal. There is increased pulmonary interstitium bilaterally. Patchy opacity is identified in the left lung base. There is no pleural effusion. There is a hiatal hernia. The bony structures are stable. IMPRESSION: Cardiomegaly.  Interstitial edema. Patchy consolidation of left lung base pneumonia is not excluded. Electronically Signed   By: Abelardo Diesel M.D.   On: 05/25/2016 07:35  Assessment/Plan 1. Acute on chronic systolic congestive heart failure (HCC) - cont modified meds from hospital including ace, metoprolol and asa (will monitor for rectal bleeding--she has previously had cameron erosions and UGIB also due to hiatal hernia) -cont bid lasix (previously  had been on, but it got changed during her last hospitalization -echo showed worsening chf and she now has chronic dyspnea with minimal exertion - CBC with Differential/Platelet - Comprehensive metabolic panel  2. Essential hypertension -bp at goal, on low side now and pt advised to notify us if she is dizzy or lightheaded - Comprehensive metabolic panel  3. CAP (community acquired pneumonia)  - does not appear she truly had pneumonia--suspect she had only chf - CBC with Differential/Platelet  4. Venous stasis dermatitis of both lower extremities -has chronic pink legs and they look great today -cont cleansing and creams  5. Parkinson's disease ([place]) -cont current sinemet, f/u with Dr. Rexene Alberts as scheduled  6. Patent foramen ovale -noted on echo, along with reduced EF and grade 2 diastolic dysfunction, has some chronic dyspnea on exertion  7. Anemia, iron deficiency -f/u cbc, monitor b/c of h/o GI bleed and she's back on asa  8. Need for prophylactic vaccination and inoculation against influenza -flu shot given  Labs/tests ordered:   Orders Placed This Encounter  Procedures  . CBC with Differential/Platelet  . Comprehensive metabolic panel    Next appt:  1 month f/u chf  Sarkis Rhines L. Sonna Lipsky, D.O. [facility] [address], [place] [address] Cell Phone (Mon-Fri 8am-5pm):  [phone] On Call:  [phone] & follow prompts after 5pm & weekends Office Phone:  [phone] Office Fax:  [phone]

## 2016-06-10 ENCOUNTER — Inpatient Hospital Stay (HOSPITAL_COMMUNITY)
Admission: EM | Admit: 2016-06-10 | Discharge: 2016-06-12 | DRG: 312 | Disposition: A | Discharge status: Skilled Nursing Facility | Payer: Medicare Other | Attending: Internal Medicine | Admitting: Internal Medicine

## 2016-06-10 ENCOUNTER — Emergency Department (HOSPITAL_COMMUNITY): Payer: Medicare Other

## 2016-06-10 ENCOUNTER — Encounter (HOSPITAL_COMMUNITY): Payer: Self-pay | Admitting: Emergency Medicine

## 2016-06-10 ENCOUNTER — Inpatient Hospital Stay (HOSPITAL_COMMUNITY): Payer: Medicare Other

## 2016-06-10 DIAGNOSIS — K5909 Other constipation: Secondary | ICD-10-CM | POA: Diagnosis not present

## 2016-06-10 DIAGNOSIS — M6281 Muscle weakness (generalized): Secondary | ICD-10-CM | POA: Diagnosis not present

## 2016-06-10 DIAGNOSIS — F411 Generalized anxiety disorder: Secondary | ICD-10-CM

## 2016-06-10 DIAGNOSIS — I5033 Acute on chronic diastolic (congestive) heart failure: Secondary | ICD-10-CM

## 2016-06-10 DIAGNOSIS — S299XXA Unspecified injury of thorax, initial encounter: Secondary | ICD-10-CM | POA: Diagnosis not present

## 2016-06-10 DIAGNOSIS — I878 Other specified disorders of veins: Secondary | ICD-10-CM | POA: Diagnosis present

## 2016-06-10 DIAGNOSIS — B3741 Candidal cystitis and urethritis: Secondary | ICD-10-CM | POA: Diagnosis not present

## 2016-06-10 DIAGNOSIS — L03116 Cellulitis of left lower limb: Secondary | ICD-10-CM | POA: Diagnosis present

## 2016-06-10 DIAGNOSIS — Y92009 Unspecified place in unspecified non-institutional (private) residence as the place of occurrence of the external cause: Secondary | ICD-10-CM

## 2016-06-10 DIAGNOSIS — M545 Low back pain, unspecified: Secondary | ICD-10-CM

## 2016-06-10 DIAGNOSIS — R4182 Altered mental status, unspecified: Secondary | ICD-10-CM

## 2016-06-10 DIAGNOSIS — R471 Dysarthria and anarthria: Secondary | ICD-10-CM | POA: Diagnosis not present

## 2016-06-10 DIAGNOSIS — R404 Transient alteration of awareness: Secondary | ICD-10-CM

## 2016-06-10 DIAGNOSIS — I509 Heart failure, unspecified: Secondary | ICD-10-CM | POA: Diagnosis not present

## 2016-06-10 DIAGNOSIS — R262 Difficulty in walking, not elsewhere classified: Secondary | ICD-10-CM | POA: Diagnosis not present

## 2016-06-10 DIAGNOSIS — Z8673 Personal history of transient ischemic attack (TIA), and cerebral infarction without residual deficits: Secondary | ICD-10-CM | POA: Diagnosis not present

## 2016-06-10 DIAGNOSIS — B999 Unspecified infectious disease: Secondary | ICD-10-CM | POA: Diagnosis not present

## 2016-06-10 DIAGNOSIS — G4730 Sleep apnea, unspecified: Secondary | ICD-10-CM | POA: Diagnosis present

## 2016-06-10 DIAGNOSIS — G9340 Encephalopathy, unspecified: Secondary | ICD-10-CM | POA: Diagnosis present

## 2016-06-10 DIAGNOSIS — S0990XA Unspecified injury of head, initial encounter: Secondary | ICD-10-CM | POA: Diagnosis not present

## 2016-06-10 DIAGNOSIS — I10 Essential (primary) hypertension: Secondary | ICD-10-CM | POA: Diagnosis present

## 2016-06-10 DIAGNOSIS — I5023 Acute on chronic systolic (congestive) heart failure: Secondary | ICD-10-CM | POA: Diagnosis not present

## 2016-06-10 DIAGNOSIS — G20 Parkinson's disease: Secondary | ICD-10-CM | POA: Diagnosis present

## 2016-06-10 DIAGNOSIS — I5042 Chronic combined systolic (congestive) and diastolic (congestive) heart failure: Secondary | ICD-10-CM | POA: Diagnosis present

## 2016-06-10 DIAGNOSIS — F419 Anxiety disorder, unspecified: Secondary | ICD-10-CM | POA: Diagnosis present

## 2016-06-10 DIAGNOSIS — H409 Unspecified glaucoma: Secondary | ICD-10-CM | POA: Diagnosis present

## 2016-06-10 DIAGNOSIS — R55 Syncope and collapse: Secondary | ICD-10-CM | POA: Diagnosis not present

## 2016-06-10 DIAGNOSIS — K219 Gastro-esophageal reflux disease without esophagitis: Secondary | ICD-10-CM | POA: Diagnosis present

## 2016-06-10 DIAGNOSIS — W19XXXA Unspecified fall, initial encounter: Secondary | ICD-10-CM | POA: Diagnosis present

## 2016-06-10 DIAGNOSIS — Z79899 Other long term (current) drug therapy: Secondary | ICD-10-CM

## 2016-06-10 DIAGNOSIS — H44512 Absolute glaucoma, left eye: Secondary | ICD-10-CM | POA: Diagnosis not present

## 2016-06-10 DIAGNOSIS — R251 Tremor, unspecified: Secondary | ICD-10-CM

## 2016-06-10 DIAGNOSIS — D649 Anemia, unspecified: Secondary | ICD-10-CM | POA: Diagnosis present

## 2016-06-10 DIAGNOSIS — E568 Deficiency of other vitamins: Secondary | ICD-10-CM | POA: Diagnosis not present

## 2016-06-10 DIAGNOSIS — I110 Hypertensive heart disease with heart failure: Secondary | ICD-10-CM | POA: Diagnosis present

## 2016-06-10 DIAGNOSIS — Z8249 Family history of ischemic heart disease and other diseases of the circulatory system: Secondary | ICD-10-CM

## 2016-06-10 DIAGNOSIS — S79911A Unspecified injury of right hip, initial encounter: Secondary | ICD-10-CM | POA: Diagnosis not present

## 2016-06-10 DIAGNOSIS — S199XXA Unspecified injury of neck, initial encounter: Secondary | ICD-10-CM | POA: Diagnosis not present

## 2016-06-10 DIAGNOSIS — R531 Weakness: Secondary | ICD-10-CM | POA: Diagnosis not present

## 2016-06-10 DIAGNOSIS — M25551 Pain in right hip: Secondary | ICD-10-CM | POA: Diagnosis not present

## 2016-06-10 DIAGNOSIS — E039 Hypothyroidism, unspecified: Secondary | ICD-10-CM | POA: Diagnosis present

## 2016-06-10 DIAGNOSIS — Z7982 Long term (current) use of aspirin: Secondary | ICD-10-CM | POA: Diagnosis not present

## 2016-06-10 DIAGNOSIS — G8929 Other chronic pain: Secondary | ICD-10-CM | POA: Diagnosis not present

## 2016-06-10 LAB — CBC WITH DIFFERENTIAL/PLATELET
Basophils Absolute: 0.1 10*3/uL (ref 0.0–0.1)
Basophils Relative: 1 %
Eosinophils Absolute: 0.3 10*3/uL (ref 0.0–0.7)
Eosinophils Relative: 5 %
HCT: 39.3 % (ref 36.0–46.0)
Hemoglobin: 11.8 g/dL — ABNORMAL LOW (ref 12.0–15.0)
LYMPHS ABS: 1.2 10*3/uL (ref 0.7–4.0)
Lymphocytes Relative: 17 %
MCH: 23.9 pg — AB (ref 26.0–34.0)
MCHC: 30 g/dL (ref 30.0–36.0)
MCV: 79.6 fL (ref 78.0–100.0)
Monocytes Absolute: 0.6 10*3/uL (ref 0.1–1.0)
Monocytes Relative: 8 %
Neutro Abs: 4.9 10*3/uL (ref 1.7–7.7)
Neutrophils Relative %: 70 %
Platelets: 190 10*3/uL (ref 150–400)
RBC: 4.94 MIL/uL (ref 3.87–5.11)
RDW: 20.6 % — AB (ref 11.5–15.5)
WBC: 7.1 10*3/uL (ref 4.0–10.5)

## 2016-06-10 LAB — COMPREHENSIVE METABOLIC PANEL
ALT: 13 U/L — AB (ref 14–54)
ANION GAP: 11 (ref 5–15)
AST: 22 U/L (ref 15–41)
Albumin: 3.6 g/dL (ref 3.5–5.0)
Alkaline Phosphatase: 63 U/L (ref 38–126)
BUN: 35 mg/dL — ABNORMAL HIGH (ref 6–20)
CHLORIDE: 108 mmol/L (ref 101–111)
CO2: 24 mmol/L (ref 22–32)
Calcium: 9.5 mg/dL (ref 8.9–10.3)
Creatinine, Ser: 0.96 mg/dL (ref 0.44–1.00)
GFR calc non Af Amer: 56 mL/min — ABNORMAL LOW (ref >60)
Glucose, Bld: 125 mg/dL — ABNORMAL HIGH (ref 65–99)
POTASSIUM: 3.9 mmol/L (ref 3.5–5.1)
SODIUM: 143 mmol/L (ref 135–145)
Total Bilirubin: 0.5 mg/dL (ref 0.3–1.2)
Total Protein: 7.5 g/dL (ref 6.5–8.1)

## 2016-06-10 LAB — RAPID URINE DRUG SCREEN, HOSP PERFORMED
Amphetamines: NOT DETECTED
BARBITURATES: NOT DETECTED
Benzodiazepines: NOT DETECTED
Cocaine: NOT DETECTED
Opiates: NOT DETECTED
TETRAHYDROCANNABINOL: NOT DETECTED

## 2016-06-10 LAB — URINALYSIS, ROUTINE W REFLEX MICROSCOPIC
BILIRUBIN URINE: NEGATIVE
Glucose, UA: NEGATIVE mg/dL
Hgb urine dipstick: NEGATIVE
KETONES UR: NEGATIVE mg/dL
LEUKOCYTES UA: NEGATIVE
NITRITE: NEGATIVE
PROTEIN: NEGATIVE mg/dL
Specific Gravity, Urine: 1.02 (ref 1.005–1.030)
pH: 5.5 (ref 5.0–8.0)

## 2016-06-10 LAB — CBG MONITORING, ED: Glucose-Capillary: 93 mg/dL (ref 65–99)

## 2016-06-10 LAB — I-STAT TROPONIN, ED: TROPONIN I, POC: 0.01 ng/mL (ref 0.00–0.08)

## 2016-06-10 LAB — I-STAT CG4 LACTIC ACID, ED
Lactic Acid, Venous: 1.28 mmol/L (ref 0.5–1.9)
Lactic Acid, Venous: 0.76 mmol/L (ref 0.5–1.9)

## 2016-06-10 MED ORDER — SODIUM CHLORIDE 0.9% FLUSH
3.0000 mL | Freq: Two times a day (BID) | INTRAVENOUS | Status: DC
  Administered 2016-06-11 (×2): 3 mL via INTRAVENOUS

## 2016-06-10 MED ORDER — LORAZEPAM 0.5 MG PO TABS: 0.5000 mg | ORAL_TABLET | Freq: Two times a day (BID) | ORAL | Status: DC | PRN

## 2016-06-10 MED ORDER — HYDRALAZINE HCL 20 MG/ML IJ SOLN: 10.0000 mg | Freq: Three times a day (TID) | INTRAMUSCULAR | Status: DC | PRN

## 2016-06-10 MED ORDER — LISINOPRIL 2.5 MG PO TABS
2.5000 mg | ORAL_TABLET | Freq: Every day | ORAL | Status: DC
  Filled 2016-06-10: qty 1

## 2016-06-10 MED ORDER — IOPAMIDOL (ISOVUE-370) INJECTION 76%
INTRAVENOUS | Status: AC
  Administered 2016-06-10: 50 mL
  Filled 2016-06-10: qty 50

## 2016-06-10 MED ORDER — ONDANSETRON HCL 4 MG/2ML IJ SOLN
4.0000 mg | Freq: Four times a day (QID) | INTRAMUSCULAR | Status: DC | PRN
  Administered 2016-06-12: 4 mg via INTRAVENOUS
  Filled 2016-06-10: qty 2

## 2016-06-10 MED ORDER — SODIUM CHLORIDE 0.9% FLUSH
3.0000 mL | Freq: Two times a day (BID) | INTRAVENOUS | Status: DC
  Administered 2016-06-11: 3 mL via INTRAVENOUS

## 2016-06-10 MED ORDER — LATANOPROST 0.005 % OP SOLN
1.0000 [drp] | Freq: Every day | OPHTHALMIC | Status: DC
  Administered 2016-06-10 – 2016-06-11 (×2): 1 [drp] via OPHTHALMIC
  Filled 2016-06-10: qty 2.5

## 2016-06-10 MED ORDER — CARBIDOPA-LEVODOPA 25-100 MG PO TABS
1.0000 | ORAL_TABLET | ORAL | Status: DC
  Administered 2016-06-11 – 2016-06-12 (×5): 1 via ORAL
  Filled 2016-06-10 (×5): qty 1
  Filled 2016-06-10: qty 2

## 2016-06-10 MED ORDER — DOCUSATE SODIUM 100 MG PO CAPS
100.0000 mg | ORAL_CAPSULE | Freq: Every day | ORAL | Status: DC
  Administered 2016-06-11 – 2016-06-12 (×2): 100 mg via ORAL
  Filled 2016-06-10 (×2): qty 1

## 2016-06-10 MED ORDER — ACETAMINOPHEN 325 MG PO TABS: 650.0000 mg | ORAL_TABLET | Freq: Four times a day (QID) | ORAL | Status: DC | PRN

## 2016-06-10 MED ORDER — ENOXAPARIN SODIUM 40 MG/0.4ML ~~LOC~~ SOLN
40.0000 mg | SUBCUTANEOUS | Status: DC
  Administered 2016-06-10 – 2016-06-11 (×2): 40 mg via SUBCUTANEOUS
  Filled 2016-06-10 (×2): qty 0.4

## 2016-06-10 MED ORDER — SODIUM CHLORIDE 0.9 % IV SOLN
INTRAVENOUS | Status: DC
  Administered 2016-06-10 (×2): via INTRAVENOUS

## 2016-06-10 MED ORDER — TRAZODONE HCL 50 MG PO TABS: 25.0000 mg | ORAL_TABLET | Freq: Every evening | ORAL | Status: DC | PRN

## 2016-06-10 MED ORDER — CARBIDOPA-LEVODOPA 25-100 MG PO TABS
1.5000 | ORAL_TABLET | ORAL | Status: DC
  Administered 2016-06-10 – 2016-06-12 (×6): 1.5 via ORAL
  Filled 2016-06-10 (×4): qty 2
  Filled 2016-06-10: qty 1.5
  Filled 2016-06-10: qty 2
  Filled 2016-06-10: qty 1.5
  Filled 2016-06-10: qty 2

## 2016-06-10 MED ORDER — FUROSEMIDE 40 MG PO TABS
40.0000 mg | ORAL_TABLET | Freq: Two times a day (BID) | ORAL | Status: DC
  Filled 2016-06-10: qty 1

## 2016-06-10 MED ORDER — METOPROLOL TARTRATE 12.5 MG HALF TABLET
12.5000 mg | ORAL_TABLET | Freq: Two times a day (BID) | ORAL | Status: DC
  Administered 2016-06-11 – 2016-06-12 (×3): 12.5 mg via ORAL
  Filled 2016-06-10 (×3): qty 1

## 2016-06-10 MED ORDER — PRAMIPEXOLE DIHYDROCHLORIDE 1 MG PO TABS
1.0000 mg | ORAL_TABLET | Freq: Three times a day (TID) | ORAL | Status: DC
  Administered 2016-06-10 – 2016-06-12 (×5): 1 mg via ORAL
  Filled 2016-06-10 (×6): qty 1

## 2016-06-10 MED ORDER — ONDANSETRON HCL 4 MG PO TABS: 4.0000 mg | ORAL_TABLET | Freq: Four times a day (QID) | ORAL | Status: DC | PRN

## 2016-06-10 MED ORDER — ASPIRIN EC 81 MG PO TBEC
81.0000 mg | DELAYED_RELEASE_TABLET | Freq: Every day | ORAL | Status: DC
  Administered 2016-06-11 – 2016-06-12 (×2): 81 mg via ORAL
  Filled 2016-06-10 (×2): qty 1

## 2016-06-10 MED ORDER — ACETAMINOPHEN 650 MG RE SUPP: 650.0000 mg | Freq: Four times a day (QID) | RECTAL | Status: DC | PRN

## 2016-06-10 NOTE — H&P (Signed)
History and Physical    [name] [id number] DOB: 1940/11/24 DOA: 06/10/2016  PCP: Hollace Kinnier, DO  Patient coming from:   Home   Chief Complaint: fall  HPI: [name] is a 75 y.o. female with a medical history significant for, but not  limited to, Parkinson's and chronic diastolic heart failure. Patient was hospitalzed for a week earlier this month for acute on chronic heart failure and CAP. CAP improved with Levaquin. SLP evaluated for aspiration, she was deemed moderate aspiration risk.  As far as CHF, her EF had dropped from 60 to 45% with wall motion abnormalities on echo. Cardiology evaluated but no further workup was done. She improved with lasix, beta blocker, fluid / salt restriction.   Patient is unable to provide much history and details possibly unreliable.. She seems excessively sleepy. Patient apparently lives a lone but has a part time caregiver. Patient fell at home this am. She was found on floor by her caregiver.Unsure if fall was mechanical or if there was loss of consciousness. She denies preceding dizziness or weakness. Complained of right hip pain on arrival. No acute fracture on hip xray.  ED Course:  Afebrile, normal pulse, elderly hypertensive, respirations 1931, O2 saturation 100% on room air. 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   Past Medical History:  Diagnosis Date  . Acute on chronic systolic heart failure ([place])   . Anemia   . Anxiety   . Arthritis   . Cataract left  . Cellulitis and abscess of leg, except foot   . Cellulitis of lower leg 12/24/2011  . CHF (congestive heart failure) ([place])   . Chronic bronchitis ([place])    "usually get it q yr"  . Chronic  systolic heart failure ([place])   . GERD (gastroesophageal reflux disease)   . Glaucoma left  . Helicobacter pylori (H. pylori)   . History of bleeding peptic ulcer   . History of blood transfusion    "don't remember why"  . Hypercholesterolemia   . Hypertension   . Hypotension, unspecified   . Hypothyroidism   . Parkinson's disease   . Pneumonia   . Sleep apnea    "had test years ago; insurance wouldn't pay for mask so I never had one" (04/27/2015)  . Stroke [facility]) X 3   "that  was the reason I got Parkinson's"    Past Surgical History:  Procedure Laterality Date  . BACK SURGERY    . ESOPHAGOGASTRODUODENOSCOPY  09/26/2011   Procedure: ESOPHAGOGASTRODUODENOSCOPY (EGD);  Surgeon: Zenovia Jarred, MD;  Location[facility] ENDOSCOPY;  Service: Gastroenterology;  Laterality: N/A;  To be done at bedside.  . ESOPHAGOGASTRODUODENOSCOPY N/A 09/30/2013   Procedure: ESOPHAGOGASTRODUODENOSCOPY (EGD);  Surgeon: [name], MD;  Location: [place];  Service: Endoscopy;  Laterality: N/A;  . ESOPHAGOGASTRODUODENOSCOPY N/A 11/25/2013   Procedure: ESOPHAGOGASTRODUODENOSCOPY (EGD);  Surgeon: [name], MD;  Location: [facility] ENDOSCOPY;  Service: Endoscopy;  Laterality: N/A;  . HEMIARTHROPLASTY HIP Right 09/24/2007   Archie Endo 01/23/2011  . KYPHOPLASTY  06/2009   T12/notes 07/17/2009  . NISSEN FUNDOPLICATION     "had OR for GERD"  . TENDON REPAIR     Gluteus medius Archie Endo 01/23/2011  . THYROIDECTOMY    . TONSILLECTOMY  Social History   Social History  . Marital status: Single    Spouse name: N/A  . Number of children: 0  . Years of education: 12   Occupational History  .      Retired   Social History Main Topics  . Smoking status: Never Smoker  . Smokeless tobacco: Never Used  . Alcohol use No  . Drug use: No  . Sexual activity: No   Other Topics Concern  . Not on file   Social History Narrative   Patient lives at home alone. Patient is retired. Patient has high school education.    Caffeine- one daily.   Right handed.  lives alone, has part time aide. Has a walker and wheelchair  Allergies  Allergen Reactions  . Codeine Other (See Comments)    Unknown allergic reaction  . Penicillins Other (See Comments)    Has patient had a PCN reaction causing immediate rash, facial/tongue/throat swelling, SOB or lightheadedness with hypotension: No Has patient had a PCN reaction causing severe rash involving mucus membranes or skin necrosis: No Has patient had a PCN reaction that required hospitalization No Has patient had a PCN reaction occurring within the last 10 years: No If all of the above answers are "NO", then may proceed with Cephalosporin use. Pt passed out in doctor's office after penicillin dose    Family History  Problem Relation Age of Onset  . GER disease Mother   . Heart disease Father     Prior to Admission medications   Medication Sig Start Date End Date Taking? Authorizing Provider  aspirin EC 81 MG tablet Take 81 mg by mouth daily.   Yes Historical Provider, MD  carbidopa-levodopa (SINEMET IR) 25-100 MG tablet Take 1 at 6a, 1 1/2 pills at [place], 1 at noon, 1 1/2 pills at 3p, 1 at 6pm, 1 1/2 pills at 9pm 01/15/16  Yes Star Age, MD  Cholecalciferol (VITAMIN D) 2000 units CAPS Take 2,000 Units by mouth daily. Reported on 04/10/2016   Yes Historical Provider, MD  docusate sodium (COLACE) 100 MG capsule Take 100 mg by mouth daily. Reported on 02/28/2016   Yes Historical Provider, MD  ferrous sulfate 325 (65 FE) MG EC tablet Take 1 tablet (325 mg total) by mouth daily. 02/22/16  Yes [name] L Reed, DO  furosemide (LASIX) 40 MG tablet Take 1 tablet (40 mg total) by mouth 2 (two) times daily. Patient taking differently: Take 40 mg by mouth 2 (two) times daily. 1 tab in the morning and 1 tab at noon 06/01/16  Yes Thurnell Lose, MD  latanoprost (XALATAN) 0.005 % ophthalmic solution Place 1 drop into the left eye daily at 6 PM.  02/23/15  Yes Historical Provider, MD    lisinopril (PRINIVIL,ZESTRIL) 2.5 MG tablet Take 1 tablet (2.5 mg total) by mouth daily. 06/02/16  Yes Thurnell Lose, MD  LORazepam (ATIVAN) 0.5 MG tablet Take 1 tablet (0.5 mg total) by mouth 2 (two) times daily as needed for anxiety. 12/29/15  Yes [name] L Reed, DO  metoprolol tartrate (LOPRESSOR) 25 MG tablet Take 0.5 tablets (12.5 mg total) by mouth 2 (two) times daily. 06/01/16  Yes Thurnell Lose, MD  Multiple Vitamins-Minerals (MULTIVITAMIN WITH MINERALS) tablet Take 1 tablet by mouth daily.   Yes Historical Provider, MD  nystatin cream (MYCOSTATIN) Apply 1 application topically 2 (two) times daily.   Yes Historical Provider, MD  oxyCODONE (OXY IR/ROXICODONE) 5 MG immediate release tablet Take 1 tablet (5 mg total)  by mouth 2 (two) times daily as needed for severe pain. 12/29/15  Yes [name] L Reed, DO  Polyethyl Glycol-Propyl Glycol (SYSTANE OP) Place 1 drop into both eyes daily.    Yes Historical Provider, MD  pramipexole (MIRAPEX) 1 MG tablet Take 1 tablet (1 mg total) by mouth 3 (three) times daily. 10/10/15  Yes Star Age, MD  triamcinolone cream (KENALOG) 0.1 % Apply 1 application topically 2 (two) times daily.   Yes Historical Provider, MD    Physical Exam: Vitals:   06/10/16 0931 06/10/16 0938 06/10/16 1057  BP:  177/81 153/81  Pulse:  76 73  Resp:  (!) 31 19  Temp:  97.7 F (36.5 C)   TempSrc:  Oral   SpO2: 98% 100% 100%    Constitutional:  Well developed white female in NAD, calm, comfortable Vitals:   06/10/16 0931 06/10/16 0938 06/10/16 1057  BP:  177/81 153/81  Pulse:  76 73  Resp:  (!) 31 19  Temp:  97.7 F (36.5 C)   TempSrc:  Oral   SpO2: 98% 100% 100%   Eyes: PER, lids and conjunctivae normal ENMT: Mucous membranes are slightly dry.  Posterior pharynx clear of any exudate or lesions..  Neck: normal, supple, no masses Respiratory: clear to auscultation bilaterally, no wheezing, no crackles. Normal respiratory effort. No accessory muscle use.   Cardiovascular: Regular rate and rhythm, no murmurs / rubs / gallops. No extremity edema. 1+ dorsal pedis pulses.   Abdomen: no tenderness, no masses palpated. No hepatomegaly. Bowel sounds positive.  Musculoskeletal: no clubbing / cyanosis. No joint deformity upper and lower extremities. Good ROM, no contractures. Diminished strength in bilateral upper and lower extremities. .  Skin: Distal LLE slightly tender, markedly erythematous and warm without significant edema.  Neurologic:  oriented x3, answers questions appropriately but slightly drowsy. Follows commands. Speech slightly slurred.  Psychiatric: pleasant, cooperative Labs on Admission: I have personally reviewed following labs and imaging studies   Urine analysis:    Component Value Date/Time   COLORURINE YELLOW 06/10/2016 [place] 06/10/2016 1118   LABSPEC 1.020 06/10/2016 1118   PHURINE 5.5 06/10/2016 1118   GLUCOSEU NEGATIVE 06/10/2016 1118   HGBUR NEGATIVE 06/10/2016 1118   BILIRUBINUR NEGATIVE 06/10/2016 1118   KETONESUR NEGATIVE 06/10/2016 1118   PROTEINUR NEGATIVE 06/10/2016 1118   UROBILINOGEN 0.2 04/27/2015 1416   NITRITE NEGATIVE 06/10/2016 1118   LEUKOCYTESUR NEGATIVE 06/10/2016 1118    Radiological Exams on Admission: Dg Chest 2 View  Result Date: 06/10/2016 CLINICAL DATA:  Status post fall.  Right hip pain. EXAM: CHEST  2 VIEW COMPARISON:  06/01/2016 FINDINGS: There is mild bilateral interstitial thickening. There is no focal parenchymal opacity. There is no pleural effusion or pneumothorax. There is stable cardiomegaly. The osseous structures are unremarkable. IMPRESSION: Cardiomegaly with mild pulmonary vascular congestion. Electronically Signed   By: Kathreen Devoid   On: 06/10/2016 10:43   Ct Head Wo Contrast  Result Date: 06/10/2016 CLINICAL DATA:  75 year old female status post fall from recliner this morning. Possible syncope. Initial encounter. EXAM: CT HEAD WITHOUT CONTRAST CT CERVICAL  SPINE WITHOUT CONTRAST TECHNIQUE: Multidetector CT imaging of the head and cervical spine was performed following the standard protocol without intravenous contrast. Multiplanar CT image reconstructions of the cervical spine were also generated. COMPARISON:  Head and cervical spine CT 07/30/2013. FINDINGS: CT HEAD FINDINGS Brain: Confluent hypodensity in the left frontal operculum resembling a chronic cortically based infarct is new since 2014. No associated mass effect.  Mild associated ex vacuo enlargement of the left lateral ventricle. No ventriculomegaly. Elsewhere gray-white matter differentiation is within normal limits. No midline shift, mass effect, or evidence of intracranial mass lesion. No acute intracranial hemorrhage identified. Vascular: Calcified atherosclerosis at the skull base. Skull: Stable and intact. Sinuses/Orbits: Visualized paranasal sinuses and mastoids are stable and well pneumatized. Other: No acute orbit or scalp soft tissue findings identified. Multiple calcified benign-appearing scalp nodules re- demonstrated. CT CERVICAL SPINE FINDINGS Alignment: Stable cervical vertebral height and alignment with some straightening of lordosis and mild spondylolisthesis. Cervicothoracic junction alignment is within normal limits. Bilateral posterior element alignment is within normal limits. Skull base and vertebrae: Visualized skull base is intact. No atlanto-occipital dissociation. No acute cervical spine fracture identified. Grossly stable and intact visualized upper thoracic levels. Soft tissues and spinal canal: Chronic left thyroid goiter with multiple nodules. Stable mild mass effect on the trachea at the thoracic inlet with no airway narrowing. Surgical clips from previous right thyroidectomy. Disc levels: Chronic mild degenerative spinal stenosis suspected at C5-C6. Upper chest: Stable mild apical scarring. Other: None. IMPRESSION: 1. Chronic left frontal operculum MCA territory infarct is new  since 2014. 2. No acute intracranial abnormality. 3. No acute fracture or listhesis identified in the cervical spine. Ligamentous injury is not excluded. Chronic cervical spine degeneration appears stable. 4. 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 Electronically Signed   By: Genevie Ann M.D.   On: 06/10/2016 11:13   Ct Cervical Spine Wo Contrast  Result Date: 06/10/2016 CLINICAL DATA:  75 year old female status post fall from recliner this morning. Possible syncope. Initial encounter. EXAM: CT HEAD WITHOUT CONTRAST CT CERVICAL SPINE WITHOUT CONTRAST TECHNIQUE: Multidetector CT imaging of the head and cervical spine was performed following the standard protocol without intravenous contrast. Multiplanar CT image reconstructions of the cervical spine were also generated. COMPARISON:  Head and cervical spine CT 07/30/2013. FINDINGS: CT HEAD FINDINGS Brain: Confluent hypodensity in the left frontal operculum resembling a chronic cortically based infarct is new since 2014. No associated mass effect. Mild associated ex vacuo enlargement of the left lateral ventricle. No ventriculomegaly. Elsewhere gray-white matter differentiation is within normal limits. No midline shift, mass effect, or evidence of intracranial mass lesion. No acute intracranial hemorrhage identified. Vascular: Calcified atherosclerosis at the skull base. Skull: Stable and intact. Sinuses/Orbits: Visualized paranasal sinuses and mastoids are stable and well pneumatized. Other: No acute orbit or scalp soft tissue findings identified. Multiple calcified benign-appearing scalp nodules re- demonstrated. CT CERVICAL SPINE FINDINGS Alignment: Stable cervical vertebral height and alignment with some straightening of lordosis and mild spondylolisthesis. Cervicothoracic junction alignment is within normal limits. Bilateral posterior element alignment is within normal limits. Skull base and vertebrae: Visualized skull base is intact. No atlanto-occipital  dissociation. No acute cervical spine fracture identified. Grossly stable and intact visualized upper thoracic levels. Soft tissues and spinal canal: Chronic left thyroid goiter with multiple nodules. Stable mild mass effect on the trachea at the thoracic inlet with no airway narrowing. Surgical clips from previous right thyroidectomy. Disc levels: Chronic mild degenerative spinal stenosis suspected at C5-C6. Upper chest: Stable mild apical scarring. Other: None. IMPRESSION: 1. 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 2. No acute intracranial abnormality. 3. No acute fracture or listhesis identified in the cervical spine. Ligamentous injury is not excluded. Chronic cervical spine degeneration appears stable. 4. 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 Electronically Signed   By: Genevie Ann M.D.   On: 06/10/2016 11:13  Dg Hip Unilat W Or Wo Pelvis 2-3 Views Right  Result Date: 06/10/2016 CLINICAL DATA:  Fall today.  Right hip pain. EXAM: DG HIP (WITH OR WITHOUT PELVIS) 2-3V RIGHT COMPARISON:  None. FINDINGS: A right hip hemiarthroplasty is noted. The hip is located. The femoral component is well seated. There is no associated fracture. The pelvis is otherwise intact. Moderate osteopenia is noted. Degenerative changes are present in the lower lumbar spine. IMPRESSION: 1. Right hip hemiarthroplasty without complication. 2. No acute fracture. 3. Moderate osteopenia. Electronically Signed   By: San Morelle M.D.   On: 06/10/2016 10:43    EKG: Independently reviewed.   EKG Interpretation  Date/Time:  Monday June 10 2016 09:37:51 EDT Ventricular Rate:  73 PR Interval:    QRS Duration: 100 QT Interval:  397 QTC Calculation: 438 R Axis:   20 Text Interpretation:  Sinus rhythm No significant change since last tracing Confirmed by Ashok Cordia  MD, Lennette Bihari ([id number]) on 06/10/2016 10:07:28 AM Also confirmed by Ashok Cordia  MD, Lennette Bihari ([id number]), editor Rolla Plate, Joelene Millin [phone])  on  06/10/2016 10:41:02 AM       Assessment/Plan   Principal Problem:   Syncope Active Problems:   Cellulitis of left leg   Essential hypertension   Fall      Fall, possibly secondary to a syncopal episode, encephalopathy, polypharmacy,  or other.  Cervical spine, head CTscan negative for acute finding. Patient is encephalopathic on exam. Orthostatics not ordered (nearly obtunded)  -admit to tele -Syncope order set utilized since episode cannot be excluded.  -No need to repeat ECHO, last one 05/27/16 - EF 40-45%, grade 2 DD.   -UDS -gentle IV hydration for next 12 hours as not likely to take PO -PT evaluation -Head CTA , rule out cerebellar stroke. Patient has a history of CVAs. If patient doesn't improve consider MR head.   LLE cellulitis. Afebrile, normal WBC, normal lactic acid. Not likely source of encephalopathy but still needs treatment.  -IV doxycycline -follow up on blood cultures  Parkinson's. Lives a lone.  -Given fall this am will ask PT to assess tomorrow. -continue home Parkinson's meds when awake enough to take PO.  -social work evaluation - is patient in need of placement?      Essential hypertension. BP borderline high -resume home BP meds when awake enough to take PO -IV hydralazine as needed for now.   Hx of combined systolic and diastolic heart failure. Recent echo - Grade 2 diastolic heart failure and EF 40-45%. No evidence for decompensation.  -monitor intake and output and daily weights  -drowsy today so holding home meds for now (resuming in am).    DVT prophylaxis:   Lovenox   Code Status:     Full code during recent admission. Unable to discuss with patient at present.  Family Communication:   None  Disposition Plan:   Discharge home vrs nursing facility in 2-3 days          Consults called:  None  Admission status:   Admission -  Telemetry   Tye Savoy NP [facility] Pager [phone]  If 7PM-7AM, please contact  night-coverage [email or web address] Password [email or web address]  06/10/2016, 11:59 AM

## 2016-06-10 NOTE — ED Provider Notes (Signed)
[facility] DEPT Provider Note   CSN: [id number] Arrival date & time: 06/10/16  0930     History   Chief Complaint Chief Complaint  Patient presents with  . Loss of Consciousness  . Hip Pain    HPI [name] is a 75 y.o. female.  HPI [name] is a 75 y.o. female with history of Parkinson's disease, congestive heart failure, anemia, hypertension, CVA, recently discharged from the hospital for pneumonia/CHF, presents to emergency department after a possible syncopal episode. Patient states she was at home, she got up and use her walker to go to the bathroom. She states she was able to get back to her recliner, but that is the last thing she remembers. Patient reports waking up on the floor. She was able to pull herself in a recliner but was unable to get to the phone. Patient was found at home by a home health aide who was unable to get through the doors and heard patient hollering for help. Patient states she's having pain to the right hip, otherwise she reports no complaints. Patient states she was feeling well yesterday, unable to recall how she fell this morning. Poor historian  Past Medical History:  Diagnosis Date  . Acute on chronic systolic heart failure ([place])   . Anemia   . Anxiety   . Arthritis   . Cataract left  . Cellulitis and abscess of leg, except foot   . Cellulitis of lower leg 12/24/2011  . CHF (congestive heart failure) ([place])   . Chronic bronchitis ([place])    "usually get it q yr"  . Chronic systolic heart failure ([place])   . GERD (gastroesophageal reflux disease)   . Glaucoma left  . Helicobacter pylori (H. pylori)   . History of bleeding peptic ulcer   . History of blood transfusion    "don't remember why"  . Hypercholesterolemia   . Hypertension   . Hypotension, unspecified   . Hypothyroidism   . Parkinson's disease   . Pneumonia   . Sleep apnea    "had test years ago; insurance wouldn't pay for mask so I never had one" (04/27/2015)  . Stroke  [facility]) X 3   "that  was the reason I got Parkinson's"    Patient Active Problem List   Diagnosis Date Noted  . Acute on chronic systolic congestive heart failure ([place])   . CHF exacerbation ([place]) 05/25/2016  . Pneumonia 05/25/2016  . Bilateral cellulitis of lower leg 03/11/2016  . Venous stasis dermatitis of both lower extremities 03/11/2016  . Melena 02/15/2016  . Right lower quadrant abdominal swelling, mass and lump 02/15/2016  . Acute delirium 04/27/2015  . Acute encephalopathy 04/27/2015  . UTI (urinary tract infection): Probable 04/27/2015  . Debility   . Dyskinesia 04/24/2015  . Cellulitis of left leg 04/23/2015  . Failure to thrive in adult 04/23/2015  . Chronic low back pain 01/18/2015  . Lower extremity pain 12/07/2014  . Cellulitis of left lower extremity   . Nausea vomiting and diarrhea 08/07/2014  . Enteritis 08/07/2014  . 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 Chronic constipation 02/11/2014  . Primary freezing of gait 02/11/2014  . Abnormality of gait 01/28/2014  . Physical deconditioning 10/05/2013  . Thrombocytopenia, unspecified ([place]) 09/30/2013  . Rectal bleeding 09/29/2013  . UGI bleed 09/29/2013  . Anemia due to blood loss, acute 09/29/2013  . Anemia, iron deficiency 04/09/2013  . Cameron lesion, acute 01/08/2012  . Acute urinary retention,  resolved after foley and no re-occurrence, 12/11/11  12/24/2011  . Cellulitis of lower leg 12/24/2011  . Diastolic dysfunction, grade 2 12/15/2011  . CAP (community acquired pneumonia)  12/15/2011  . Abnormal EKG,(TWI lead 3 on one of 7 EKGs this admission) 12/12/2011  . Chest pain, MI R/O, negative MI- secondary to CAP 12/10/11 12/10/2011  . Cor pulmonale ([place]) 12/10/2011  . Syncope and collapse, pt fell and did not know why she fell. 12/10/2011  . Hematemesis 09/26/2011  . Acute blood loss anemia 09/26/2011  . Hypotension 09/26/2011  . Leucocytosis 09/26/2011  . Aspiration of  blood 09/26/2011  . Parkinson disease ([place]) 09/26/2011  . Essential hypertension 09/26/2011  . GI bleed: January 2013, Hgb stable, stool negative 09/26/2011  . Gastric ulcer with hemorrhage 09/26/2011  . Esophagitis, erosive 09/26/2011    Past Surgical History:  Procedure Laterality Date  . BACK SURGERY    . ESOPHAGOGASTRODUODENOSCOPY  09/26/2011   Procedure: ESOPHAGOGASTRODUODENOSCOPY (EGD);  Surgeon: Zenovia Jarred, MD;  Location: [facility] ENDOSCOPY;  Service: Gastroenterology;  Laterality: N/A;  To be done at bedside.  . ESOPHAGOGASTRODUODENOSCOPY N/A 09/30/2013   Procedure: ESOPHAGOGASTRODUODENOSCOPY (EGD);  Surgeon: [name], MD;  Location: [facility];  Service: Endoscopy;  Laterality: N/A;  . ESOPHAGOGASTRODUODENOSCOPY N/A 11/25/2013   Procedure: ESOPHAGOGASTRODUODENOSCOPY (EGD);  Surgeon: [name], MD;  Location: [facility] ENDOSCOPY;  Service: Endoscopy;  Laterality: N/A;  . HEMIARTHROPLASTY HIP Right 09/24/2007   Archie Endo 01/23/2011  . KYPHOPLASTY  06/2009   T12/notes 07/17/2009  . NISSEN FUNDOPLICATION     "had OR for GERD"  . TENDON REPAIR     Gluteus medius Ar[facility] Endo 01/23/2011  . THYROIDECTOMY    . TONSILLECTOMY      OB History    No data available       Home Medications    Prior to Admission medications   Medication Sig Start Date End Date Taking? Authorizing Provider  aspirin EC 81 MG tablet Take 81 mg by mouth daily.    Historical Provider, MD  carbidopa-levodopa (SINEMET IR) 25-100 MG tablet Take 1 at 6a, 1 1/2 pills at [place], 1 at noon, 1 1/2 pills at 3p, 1 at 6pm, 1 1/2 pills at 9pm 01/15/16   Star Age, MD  Cholecalciferol (VITAMIN D) 2000 units CAPS Take 2,000 Units by mouth daily. Reported on 04/10/2016    Historical Provider, MD  docusate sodium (COLACE) 100 MG capsule Take 100 mg by mouth daily. Reported on 02/28/2016    Historical Provider, MD  ferrous sulfate 325 (65 FE) MG EC tablet Take 1 tablet (325 mg total) by mouth daily. 02/22/16   [name] L Reed, DO  furosemide  (LASIX) 40 MG tablet Take 1 tablet (40 mg total) by mouth 2 (two) times daily. 06/01/16   Thurnell Lose, MD  latanoprost (XALATAN) 0.005 % ophthalmic solution Place 1 drop into the left eye daily at 6 PM.  02/23/15   Historical Provider, MD  lisinopril (PRINIVIL,ZESTRIL) 2.5 MG tablet Take 1 tablet (2.5 mg total) by mouth daily. 06/02/16   Thurnell Lose, MD  LORazepam (ATIVAN) 0.5 MG tablet Take 1 tablet (0.5 mg total) by mouth 2 (two) times daily as needed for anxiety. 12/29/15   [name] L Reed, DO  metoprolol tartrate (LOPRESSOR) 25 MG tablet Take 0.5 tablets (12.5 mg total) by mouth 2 (two) times daily. 06/01/16   Thurnell Lose, MD  Multiple Vitamins-Minerals (MULTIVITAMIN WITH MINERALS) tablet Take 1 tablet by mouth daily.    Historical Provider, MD  nystatin cream (MYCOSTATIN) Apply 1 application topically 2 (two) times daily.    Historical Provider, MD  oxyCODONE (OXY IR/ROXICODONE) 5 MG immediate release tablet Take 1 tablet (5 mg total) by mouth 2 (two) times daily as needed for severe pain. 12/29/15   [name] L Reed, DO  Polyethyl Glycol-Propyl Glycol (SYSTANE OP) Place 1 drop into both eyes daily.     Historical Provider, MD  pramipexole (MIRAPEX) 1 MG tablet Take 1 tablet (1 mg total) by mouth 3 (three) times daily. 10/10/15   Star Age, MD  triamcinolone cream (KENALOG) 0.1 % Apply 1 application topically 2 (two) times daily.    Historical Provider, MD    Family History Family History  Problem Relation Age of Onset  . GER disease Mother   . Heart disease Father     Social History Social History  Substance Use Topics  . Smoking status: Never Smoker  . Smokeless tobacco: Never Used  . Alcohol use No     Allergies   Codeine and Penicillins   Review of Systems Review of Systems  Constitutional: Negative for chills and fever.  Respiratory: Negative for cough, chest tightness and shortness of breath.   Cardiovascular: Negative for chest pain, palpitations and leg swelling.    Gastrointestinal: Negative for abdominal pain, diarrhea, nausea and vomiting.  Genitourinary: Negative for dysuria, flank pain and pelvic pain.  Musculoskeletal: Positive for arthralgias. Negative for myalgias, neck pain and neck stiffness.  Skin: Negative for rash.  Neurological: Positive for weakness. Negative for dizziness and headaches.  All other systems reviewed and are negative.    Physical Exam Updated Vital Signs BP 177/81 (BP Location: Right Arm)   Pulse 76   Temp 97.7 F (36.5 C) (Oral)   Resp (!) 31   SpO2 100%   Physical Exam  Constitutional: She is oriented to person, place, and time. She appears well-developed and well-nourished. No distress.  Pt is leaning to the right, unable to sit up straight  HENT:  Head: Normocephalic.  Eyes: Conjunctivae and EOM are normal. Pupils are equal, round, and reactive to light.  Neck: Normal range of motion. Neck supple.  No midline tenderness  Cardiovascular: Normal rate, regular rhythm and normal heart sounds.   Pulmonary/Chest: Effort normal and breath sounds normal. No respiratory distress. She has no wheezes. She has no rales.  Abdominal: Soft. Bowel sounds are normal. She exhibits no distension. There is no tenderness. There is no rebound.  Musculoskeletal: She exhibits no edema.  ttp over right lateral hip. limitted ROM of the hip due to pain.  Neurological: She is alert and oriented to person, place, and time. A cranial nerve deficit is present.  Oriented x4. Generalized weakness in all extremities. Moving all extremities. Poor coordination. Speech slurred with some studdering  Skin: Skin is warm and dry. Capillary refill takes less than 2 seconds.  Psychiatric: She has a normal mood and affect. Her behavior is normal.  Nursing note and vitals reviewed.    ED Treatments / Results  Labs (all labs ordered are listed, but only abnormal results are displayed) Labs Reviewed  CBC WITH DIFFERENTIAL/PLATELET - Abnormal;  Notable for the following:       Result Value   Hemoglobin 11.8 (*)    MCH 23.9 (*)    RDW 20.6 (*)    All other components within normal limits  COMPREHENSIVE METABOLIC PANEL - Abnormal; Notable for the following:    Glucose, Bld 125 (*)    BUN 35 (*)  ALT 13 (*)    GFR calc non Af Amer 56 (*)    All other components within normal limits  CULTURE, BLOOD (ROUTINE X 2)  CULTURE, BLOOD (ROUTINE X 2)  URINALYSIS, ROUTINE W REFLEX MICROSCOPIC (NOT AT [facility])  URINE RAPID DRUG SCREEN, HOSP PERFORMED  I-STAT CG4 LACTIC ACID, ED  I-STAT TROPOININ, ED  I-STAT CG4 LACTIC ACID, ED    EKG  EKG Interpretation  Date/Time:  Monday June 10 2016 09:37:51 EDT Ventricular Rate:  73 PR Interval:    QRS Duration: 100 QT Interval:  397 QTC Calculation: 438 R Axis:   20 Text Interpretation:  Sinus rhythm No significant change since last tracing Confirmed by Ashok Cordia  MD, Lennette Bihari ([id number]) on 06/10/2016 10:07:28 AM Also confirmed by Ashok Cordia  MD, Lennette Bihari ([id number]), editor [place], Joelene Millin [phone])  on 06/10/2016 10:41:02 AM       Radiology Dg Chest 2 View  Result Date: 06/10/2016 CLINICAL DATA:  Status post fall.  Right hip pain. EXAM: CHEST  2 VIEW COMPARISON:  06/01/2016 FINDINGS: There is mild bilateral interstitial thickening. There is no focal parenchymal opacity. There is no pleural effusion or pneumothorax. There is stable cardiomegaly. The osseous structures are unremarkable. IMPRESSION: Cardiomegaly with mild pulmonary vascular congestion. Electronically Signed   By: Kathreen Devoid   On: 06/10/2016 10:43   Ct Head Wo Contrast  Result Date: 06/10/2016 CLINICAL DATA:  75 year old female status post fall from recliner this morning. Possible syncope. Initial encounter. EXAM: CT HEAD WITHOUT CONTRAST CT CERVICAL SPINE WITHOUT CONTRAST TECHNIQUE: Multidetector CT imaging of the head and cervical spine was performed following the standard protocol without intravenous contrast. Multiplanar CT image  reconstructions of the cervical spine were also generated. COMPARISON:  Head and cervical spine CT 07/30/2013. FINDINGS: CT HEAD FINDINGS Brain: Confluent hypodensity in the left frontal operculum resembling a chronic cortically based infarct is new since 2014. No associated mass effect. Mild associated ex vacuo enlargement of the left lateral ventricle. No ventriculomegaly. Elsewhere gray-white matter differentiation is within normal limits. No midline shift, mass effect, or evidence of intracranial mass lesion. No acute intracranial hemorrhage identified. Vascular: Calcified atherosclerosis at the skull base. Skull: Stable and intact. Sinuses/Orbits: Visualized paranasal sinuses and mastoids are stable and well pneumatized. Other: No acute orbit or scalp soft tissue findings identified. Multiple calcified benign-appearing scalp nodules re- demonstrated. CT CERVICAL SPINE FINDINGS Alignment: Stable cervical vertebral height and alignment with some straightening of lordosis and mild spondylolisthesis. Cervicothoracic junction alignment is within normal limits. Bilateral posterior element alignment is within normal limits. Skull base and vertebrae: Visualized skull base is intact. No atlanto-occipital dissociation. No acute cervical spine fracture identified. Grossly stable and intact visualized upper thoracic levels. Soft tissues and spinal canal: Chronic left thyroid goiter with multiple nodules. Stable mild mass effect on the trachea at the thoracic inlet with no airway narrowing. Surgical clips from previous right thyroidectomy. Disc levels: Chronic mild degenerative spinal stenosis suspected at C5-C6. Upper chest: Stable mild apical scarring. Other: None. IMPRESSION: 1. 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 2. No acute intracranial abnormality. 3. No acute fracture or listhesis identified in the cervical spine. Ligamentous injury is not excluded. Chronic cervical spine  degeneration appears stable. 4. 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 Electronically Signed   By: Genevie Ann M.D.   On: 06/10/2016 11:13   Ct Cervical Spine Wo Contrast  Result Date: 06/10/2016 CLINICAL DATA:  75 year old female status post fall from recliner this morning.  Possible syncope. Initial encounter. EXAM: CT HEAD WITHOUT CONTRAST CT CERVICAL SPINE WITHOUT CONTRAST TECHNIQUE: Multidetector CT imaging of the head and cervical spine was performed following the standard protocol without intravenous contrast. Multiplanar CT image reconstructions of the cervical spine were also generated. COMPARISON:  Head and cervical spine CT 07/30/2013. FINDINGS: CT HEAD FINDINGS Brain: Confluent hypodensity in the left frontal operculum resembling a chronic cortically based infarct is new since 2014. No associated mass effect. Mild associated ex vacuo enlargement of the left lateral ventricle. No ventriculomegaly. Elsewhere gray-white matter differentiation is within normal limits. No midline shift, mass effect, or evidence of intracranial mass lesion. No acute intracranial hemorrhage identified. Vascular: Calcified atherosclerosis at the skull base. Skull: Stable and intact. Sinuses/Orbits: Visualized paranasal sinuses and mastoids are stable and well pneumatized. Other: No acute orbit or scalp soft tissue findings identified. Multiple calcified benign-appearing scalp nodules re- demonstrated. CT CERVICAL SPINE FINDINGS Alignment: Stable cervical vertebral height and alignment with some straightening of lordosis and mild spondylolisthesis. Cervicothoracic junction alignment is within normal limits. Bilateral posterior element alignment is within normal limits. Skull base and vertebrae: Visualized skull base is intact. No atlanto-occipital dissociation. No acute cervical spine fracture identified. Grossly stable and intact visualized upper thoracic levels. Soft tissues and spinal canal: Chronic left thyroid goiter  with multiple nodules. Stable mild mass effect on the trachea at the thoracic inlet with no airway narrowing. Surgical clips from previous right thyroidectomy. Disc levels: Chronic mild degenerative spinal stenosis suspected at C5-C6. Upper chest: Stable mild apical scarring. Other: None. IMPRESSION: 1. 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 2. No acute intracranial abnormality. 3. No acute fracture or listhesis identified in the cervical spine. Ligamentous injury is not excluded. Chronic cervical spine degeneration appears stable. 4. 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 Electronically Signed   By: Genevie Ann M.D.   On: 06/10/2016 11:13   Dg Hip Unilat W Or Wo Pelvis 2-3 Views Right  Result Date: 06/10/2016 CLINICAL DATA:  Fall today.  Right hip pain. EXAM: DG HIP (WITH OR WITHOUT PELVIS) 2-3V RIGHT COMPARISON:  None. FINDINGS: A right hip hemiarthroplasty is noted. The hip is located. The femoral component is well seated. There is no associated fracture. The pelvis is otherwise intact. Moderate osteopenia is noted. Degenerative changes are present in the lower lumbar spine. IMPRESSION: 1. Right hip hemiarthroplasty without complication. 2. No acute fracture. 3. Moderate osteopenia. Electronically Signed   By: San Morelle M.D.   On: 06/10/2016 10:43    Procedures Procedures (including critical care time)  Medications Ordered in ED   Initial Impression / Assessment and Plan / ED Course  I have reviewed the triage vital signs and the nursing notes.  Pertinent labs & imaging results that were available during my care of the patient were reviewed by me and considered in my medical decision making (see chart for details).  Clinical Course    10:15 AM  Pt in ed with weakness, possible syncopal episode. Patient is found in the bed leaning to the right, she is unable to sit up for me to even listen to her lungs. She states "I haven't been able to sit up  in years." Patient however states she is able to ambulate with a walker. I did call patient's friend Lesleigh Noe and talk to her. It appears that patient's speech is at baseline. I reviewed patient's records and prior admission just in the last week. Patient had a physical therapy evaluation in  which patient was able to walk with a walker, sit and stand with assistance. We will check labs and CT head and cervical spine. X-ray of the right hip. Patient is alert and oriented 4 and able to provide some history.  2:00 PM Labs and CT/Xray unremarkable. Pt appears very solmnolent and able to wake up to loud stimuli. Pt appears weak and unable to sit up or stand up. Discussed with triad, will admit for further possible syncope work up and AMS. VS remained stable during the ED stay.   Vitals:   06/10/16 1245 06/10/16 1300 06/10/16 1302 06/10/16 1333  BP: 139/70 150/78 150/78 137/67  Pulse: 64 63 61 (!) 58  Resp: 15 15 23 14   Temp:      TempSrc:      SpO2: 100% 100% 100% 99%     Final Clinical Impressions(s) / ED Diagnoses   Final diagnoses:  Altered mental state    New Prescriptions New Prescriptions   No medications on file     Jeannett Senior, PA-C 06/10/16 [place], MD 06/11/16 [phone]

## 2016-06-10 NOTE — Progress Notes (Signed)
Pt has arrived to room 5w25, belongings in room. Pt very lethargic, will not stay awake. Skin intact- cellulitis on bilat lower extremities

## 2016-06-10 NOTE — ED Triage Notes (Signed)
Pt to ER from home where patient lives alone, BIB [facility]. Per EMS, home health arrived to patient home today and could not get into her house but could hear patient hollering for help. Pt apparently had fallen this morning but was able to get herself up into recliner. Possible syncopal episode, patient has hx of same. Pt complaining of right hip pain, no obvious deformity noted per EMS. Pt reports "she doesn't know what happened, I woke up on the floor." VSS.

## 2016-06-10 NOTE — ED Notes (Signed)
Pt extremely somnolent at this time. Groaning when attempting to awaken patient but not able to stay awake. Pt not able to protect airway at this time with oral intake. Will notify MD.

## 2016-06-11 ENCOUNTER — Inpatient Hospital Stay (HOSPITAL_COMMUNITY): Payer: Medicare Other

## 2016-06-11 DIAGNOSIS — L03116 Cellulitis of left lower limb: Secondary | ICD-10-CM

## 2016-06-11 DIAGNOSIS — R55 Syncope and collapse: Principal | ICD-10-CM

## 2016-06-11 LAB — CBC
HEMATOCRIT: 33 % — AB (ref 36.0–46.0)
Hemoglobin: 10 g/dL — ABNORMAL LOW (ref 12.0–15.0)
MCH: 24 pg — AB (ref 26.0–34.0)
MCHC: 30.3 g/dL (ref 30.0–36.0)
MCV: 79.3 fL (ref 78.0–100.0)
PLATELETS: 192 10*3/uL (ref 150–400)
RBC: 4.16 MIL/uL (ref 3.87–5.11)
RDW: 20.5 % — ABNORMAL HIGH (ref 11.5–15.5)
WBC: 7.7 10*3/uL (ref 4.0–10.5)

## 2016-06-11 LAB — BASIC METABOLIC PANEL
Anion gap: 7 (ref 5–15)
BUN: 20 mg/dL (ref 6–20)
CHLORIDE: 107 mmol/L (ref 101–111)
CO2: 29 mmol/L (ref 22–32)
CREATININE: 0.84 mg/dL (ref 0.44–1.00)
Calcium: 8.7 mg/dL — ABNORMAL LOW (ref 8.9–10.3)
GFR calc non Af Amer: >60 mL/min (ref >60)
Glucose, Bld: 103 mg/dL — ABNORMAL HIGH (ref 65–99)
POTASSIUM: 4 mmol/L (ref 3.5–5.1)
Sodium: 143 mmol/L (ref 135–145)

## 2016-06-11 LAB — GLUCOSE, CAPILLARY: Glucose-Capillary: 96 mg/dL (ref 65–99)

## 2016-06-11 LAB — CK: Total CK: 44 U/L (ref 38–234)

## 2016-06-11 MED ORDER — GADOBENATE DIMEGLUMINE 529 MG/ML IV SOLN
20.0000 mL | Freq: Once | INTRAVENOUS | Status: AC
  Administered 2016-06-11: 20 mL via INTRAVENOUS

## 2016-06-11 MED ORDER — DOXYCYCLINE HYCLATE 100 MG IV SOLR
100.0000 mg | Freq: Two times a day (BID) | INTRAVENOUS | Status: DC
  Administered 2016-06-11 – 2016-06-12 (×2): 100 mg via INTRAVENOUS
  Filled 2016-06-11 (×3): qty 100

## 2016-06-11 NOTE — Progress Notes (Addendum)
On arrival into patient's room, patient was shaking and speech was garbled. Patient able to move all extremities and vital signs were stable. Patient did not complain of any pain or difficulty breathing. Rapid response nurse notified. Rapid Response nurse told RN to page the MD. Baltazar Najjar, NP paged. Orders placed for MRI of head by [place],NP. Will continue to monitor and treat per MD orders.

## 2016-06-11 NOTE — Clinical Social Work Note (Signed)
Clinical Social Work Assessment  Patient Details  Name: [name] MRN: [id number] Date of Birth: 24-Mar-1941  Date of referral:  06/11/16               Reason for consult:  Facility Placement                Permission sought to share information with:  Facility Sport and exercise psychologist, Family Supports Permission granted to share information::  Yes, Verbal Permission Granted  Name::     Blima Dessert::  SNFs  Relationship::  Son  Contact Information:  [phone]  Housing/Transportation Living arrangements for the past 2 months:  Single Family Home Source of Information:  Patient, Other (Comment Required) (Brother) Patient Interpreter Needed:  None Criminal Activity/Legal Involvement Pertinent to Current Situation/Hospitalization:  No - Comment as needed Significant Relationships:  Siblings Lives with:  Self Do you feel safe going back to the place where you live?  No Need for family participation in patient care:  Yes (Comment)  Care giving concerns:  CSW received consult for possible [name] placement at time of discharge. CSW met with patient and spoke with patient's brother, [name], on the phone regarding PT recommendation of [name] placement at time of discharge. Per patient's brother, patient lives alone and is currently unable to care for herself at home given patient's current physical needs and fall risk, though family does try to help out. Patient and patient's brother expressed understanding of PT recommendation and are agreeable to [name] placement at time of discharge. CSW to continue to follow and assist with discharge planning needs.   Social Worker assessment / plan:  CSW spoke with patient and patient's brother concerning possibility of rehab at [facility] before returning home.  Employment status:  Retired Forensic scientist:  Medicare PT Recommendations:  [place] / Referral to community resources:  [place]  Patient/Family's  Response to care:  Patient and patient's brother recognize need for rehab before returning home and are agreeable to a [name] in [place]. Patient did not like her experience at [facility].  Patient/Family's Understanding of and Emotional Response to Diagnosis, Current Treatment, and Prognosis:  Patient/family is realistic regarding therapy needs and expressed being hopeful for [name] placement. Patient expressed understanding of CSW role and discharge process. No questions/concerns about plan or treatment.    Emotional Assessment Appearance:  Appears stated age Attitude/Demeanor/Rapport:  Other (Appropriate) Affect (typically observed):  Accepting, Appropriate Orientation:  Oriented to Self, Oriented to Situation, Oriented to Place Alcohol / Substance use:  Not Applicable Psych involvement (Current and /or in the community):  No (Comment)  Discharge Needs  Concerns to be addressed:  Care Coordination Readmission within the last 30 days:  Yes Current discharge risk:  None Barriers to Discharge:  Continued Medical Work up   [employer], [place] 06/11/2016, 2:10 PM

## 2016-06-11 NOTE — Evaluation (Signed)
l Therapy Evaluation Patient Details Name: [name] MRN: [id number] DOB: [birth year] Today's Date: 06/11/2016   History of Present Illness  75 y.o. female with history of Parkinson's disease, congestive heart failure, anemia, hypertension, CVA, recently discharged from the hospital for pneumonia/CHF, presents to emergency department after a possible syncopal episode  Clinical Impression  Patient demonstrates deficits in functional mobility as indicated below. Will need continued skilled PT to address deficits and maixmize function. Will see as indicated and progress as tolerated.  Orthostatics performed and charted (negative), patient does endorse dizziness.     Follow Up Recommendations [name];Supervision for mobility/OOB    Equipment Recommendations  None recommended by PT    Recommendations for Other Services OT consult     Precautions / Restrictions Precautions Precautions: Fall Precaution Comments: H/o falls at home. Patient with significant dyskinesia throughout Restrictions Weight Bearing Restrictions: No      Mobility  Bed Mobility Overal bed mobility: Needs Assistance Bed Mobility: Supine to Sit Rolling: Min assist         General bed mobility comments: Min assist to come to EOB, increased time to perform, assist to scoot to EOB  Transfers Overall transfer level: Needs assistance Equipment used: Rolling walker (2 wheeled) Transfers: Sit to/from Stand Sit to Stand: Mod assist         General transfer comment: Increased time to perform, moderate assisst to power to upright, VCs for hadn placement and safety. + dizziness upon standing  Ambulation/Gait             General Gait Details: could not tolerate due to dizziness/swimmy headed feeling (VSS)  Stairs            Wheelchair Mobility    Modified Rankin (Stroke Patients Only)       Balance     Sitting balance-Leahy Scale: Good     Standing balance support: Bilateral upper  extremity supported Standing balance-Leahy Scale: Poor                               Pertinent Vitals/Pain Pain Assessment: Faces Faces Pain Scale: Hurts little more Pain Location: bilateral feet Pain Descriptors / Indicators: Sore Pain Intervention(s): Monitored during session    Home Living Family/patient expects to be discharged to:: Unsure Living Arrangements: Alone Available Help at Discharge: Family;Personal care attendant;Available PRN/intermittently (Aide 3x/week; brother) Type of Home: House Home Access: Ramped entrance     Home Layout: One level Home Equipment: [place] - 4 wheels;Bedside commode;Tub bench;Wheelchair - Press photographer;Shower seat - built in Brewing technologist)      Prior Function Level of Independence: Independent with assistive device(s);Needs assistance   Gait / Transfers Assistance Needed: Uses rollator for gait.  Sleeps in lift chair.  ADL's / Homemaking Assistance Needed: Aide assists with bath, housekeeping, some meals.  Brother assists with transportation, or patient uses [employer].  [employer].  Comments: Uses rollator. Pt with recent falls.  Reports she used to have an aide but no longer does. has meals on wheels and some support from brother.     Hand Dominance   Dominant Hand: Right    Extremity/Trunk Assessment   Upper Extremity Assessment: Generalized weakness           Lower Extremity Assessment: Generalized weakness RLE Deficits / Details: Strength grossly 3+/5; decreased coordination LLE Deficits / Details: assymterical weakness, Strength grossly 3/5; decreased coordination; Increased difficulty moving LLE compared to RLE  Cervical /  Trunk Assessment: Other exceptions  Communication   Communication: Expressive difficulties (dysarthria due to dyskinesia; weak voice)  Cognition Arousal/Alertness: Awake/alert Behavior During Therapy: WFL for tasks assessed/performed Overall Cognitive Status: Within  Functional Limits for tasks assessed                      General Comments      Exercises     Assessment/Plan    PT Assessment Patient needs continued PT services  PT Problem List Decreased strength;Decreased activity tolerance;Decreased balance;Decreased mobility;Decreased coordination;Decreased knowledge of use of DME;Cardiopulmonary status limiting activity          PT Treatment Interventions DME instruction;Gait training;Functional mobility training;Therapeutic activities;Therapeutic exercise;Balance training;Patient/family education    PT Goals (Current goals can be found in the Care Plan section)  Acute Rehab PT Goals Patient Stated Goal: None stated today PT Goal Formulation: With patient Time For Goal Achievement: 06/02/16 Potential to Achieve Goals: Fair    Frequency Min 3X/week   Barriers to discharge Decreased caregiver support      Co-evaluation               End of Session Equipment Utilized During Treatment: Gait belt Activity Tolerance: Patient limited by fatigue;Other (comment) (dizziness) Patient left: in chair;with call bell/phone within reach;with chair alarm set Nurse Communication: Mobility status         Time: [id number] PT Time Calculation (min) (ACUTE ONLY): 28 min   Charges:   PT Evaluation $PT Eval Moderate Complexity: 1 Procedure PT Treatments $Therapeutic Activity: 8-22 mins   PT G CodesDuncan Dull Jul 06, 2016, 12:11 PM Alben Deeds, [place] DPT  [phone]

## 2016-06-11 NOTE — NC FL2 (Signed)
[place] MEDICAID FL2 LEVEL OF CARE SCREENING TOOL     IDENTIFICATION  Patient Name: [name] Birthdate: 1941-09-22 Sex: female Admission Date (Current Location): 06/10/2016  [facility] and [place] Number:  Herbalist and Address:  [facility], [place] [address] [place], [place] [address]      Provider Number: [id number]  Attending Physician Name and Address:  Domenic Polite, MD  Relative Name and Phone Number:  [name], son, [phone]    Current Level of Care: Hospital Recommended Level of Care: [place] Prior Approval Number:    Date Approved/Denied:   PASRR Number: [id number]  Discharge Plan: [name]    Current Diagnoses: Patient Active Problem List   Diagnosis Date Noted  . Syncope 06/10/2016  . Fall 06/10/2016  . Near syncope   . Altered mental state   . Acute on chronic systolic congestive heart failure ([place])   . CHF exacerbation ([place]) 05/25/2016  . Pneumonia 05/25/2016  . Bilateral cellulitis of lower leg 03/11/2016  . Venous stasis dermatitis of both lower extremities 03/11/2016  . Melena 02/15/2016  . Right lower quadrant abdominal swelling, mass and lump 02/15/2016  . Acute delirium 04/27/2015  . Acute encephalopathy 04/27/2015  . UTI (urinary tract infection): Probable 04/27/2015  . Debility   . Dyskinesia 04/24/2015  . Cellulitis of left leg 04/23/2015  . Failure to thrive in adult 04/23/2015  . Chronic low back pain 01/18/2015  . Lower extremity pain 12/07/2014  . Cellulitis of left lower extremity   . Nausea vomiting and diarrhea 08/07/2014  . Enteritis 08/07/2014  . 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 Chronic constipation 02/11/2014  . Primary freezing of gait 02/11/2014  . Abnormality of gait 01/28/2014  . Physical deconditioning 10/05/2013  . Thrombocytopenia, unspecified ([place]) 09/30/2013  . Rectal bleeding 09/29/2013  . UGI bleed 09/29/2013  .  Anemia due to blood loss, acute 09/29/2013  . Anemia, iron deficiency 04/09/2013  . Cameron lesion, acute 01/08/2012  . Acute urinary retention, resolved after foley and no re-occurrence, 12/11/11  12/24/2011  . Cellulitis of lower leg 12/24/2011  . Diastolic dysfunction, grade 2 12/15/2011  . CAP (community acquired pneumonia)  12/15/2011  . Abnormal EKG,(TWI lead 3 on one of 7 EKGs this admission) 12/12/2011  . Chest pain, MI R/O, negative MI- secondary to CAP 12/10/11 12/10/2011  . Cor pulmonale ([place]) 12/10/2011  . Syncope and collapse, pt fell and did not know why she fell. 12/10/2011  . Hematemesis 09/26/2011  . Acute blood loss anemia 09/26/2011  . Hypotension 09/26/2011  . Leucocytosis 09/26/2011  . Aspiration of blood 09/26/2011  . Parkinson disease ([place]) 09/26/2011  . Essential hypertension 09/26/2011  . GI bleed: January 2013, Hgb stable, stool negative 09/26/2011  . Gastric ulcer with hemorrhage 09/26/2011  . Esophagitis, erosive 09/26/2011    Orientation RESPIRATION BLADDER Height & Weight     Self, Situation, Place  Normal Continent Weight: 75.4 kg (166 lb 3.6 oz) Height:     BEHAVIORAL SYMPTOMS/MOOD NEUROLOGICAL BOWEL NUTRITION STATUS      Continent Diet (Please see DC Summary)  AMBULATORY STATUS COMMUNICATION OF NEEDS Skin   Extensive Assist Verbally Normal                       Personal Care Assistance Level of Assistance  Bathing, Feeding, Dressing Bathing Assistance: Maximum assistance Feeding assistance: Independent Dressing Assistance: Limited assistance     Functional Limitations Info  SPECIAL CARE FACTORS FREQUENCY  PT (By licensed PT)     PT Frequency: 5x/week              Contractures      Additional Factors Info  Code Status, Allergies Code Status Info: Full Allergies Info: Codeine; Penicillins           Current Medications (06/11/2016):  This is the current hospital active medication list Current  Facility-Administered Medications  Medication Dose Route Frequency Provider Last Rate Last Dose  . acetaminophen (TYLENOL) tablet 650 mg  650 mg Oral Q6H PRN Willia Craze, NP       Or  . acetaminophen (TYLENOL) suppository 650 mg  650 mg Rectal Q6H PRN Willia Craze, NP      . aspirin EC tablet 81 mg  81 mg Oral Daily Willia Craze, NP   81 mg at 06/11/16 1131  . carbidopa-levodopa (SINEMET IR) 25-100 MG per tablet immediate release 1 tablet  1 tablet Oral 3 times per day Waldemar Dickens, MD   1 tablet at 06/11/16 1332  . carbidopa-levodopa (SINEMET IR) 25-100 MG per tablet immediate release 1.5 tablet  1.5 tablet Oral 3 times per day Waldemar Dickens, MD   1.5 tablet at 06/11/16 1132  . docusate sodium (COLACE) capsule 100 mg  100 mg Oral Daily Willia Craze, NP   100 mg at 06/11/16 1132  . doxycycline (VIBRAMYCIN) 100 mg in dextrose 5 % 250 mL IVPB  100 mg Intravenous Q12H Domenic Polite, MD      . enoxaparin (LOVENOX) injection 40 mg  40 mg Subcutaneous Q24H Willia Craze, NP   40 mg at 06/10/16 1858  . hydrALAZINE (APRESOLINE) injection 10 mg  10 mg Intravenous Q8H PRN Willia Craze, NP      . latanoprost (XALATAN) 0.005 % ophthalmic solution 1 drop  1 drop Left Eye QHS Willia Craze, NP   1 drop at 06/10/16 2304  . LORazepam (ATIVAN) tablet 0.5 mg  0.5 mg Oral BID PRN Willia Craze, NP      . metoprolol tartrate (LOPRESSOR) tablet 12.5 mg  12.5 mg Oral BID Willia Craze, NP   12.5 mg at 06/11/16 1132  . ondansetron (ZOFRAN) tablet 4 mg  4 mg Oral Q6H PRN Willia Craze, NP       Or  . ondansetron [facility]) injection 4 mg  4 mg Intravenous Q6H PRN Willia Craze, NP      . pramipexole (MIRAPEX) tablet 1 mg  1 mg Oral TID Willia Craze, NP   1 mg at 06/11/16 1131  . sodium chloride flush (NS) 0.9 % injection 3 mL  3 mL Intravenous Q12H Willia Craze, NP   3 mL at 06/11/16 1000  . sodium chloride flush (NS) 0.9 % injection 3 mL  3 mL Intravenous Q12H Willia Craze, NP   3 mL at 06/11/16 1000  . traZODone (DESYREL) tablet 25 mg  25 mg Oral QHS PRN Willia Craze, NP         Discharge Medications: Please see discharge summary for a list of discharge medications.  Relevant Imaging Results:  Relevant Lab Results:   Additional Information SSn: Nicholls Fithian, [place]

## 2016-06-11 NOTE — Progress Notes (Signed)
PROGRESS NOTE    [name]  [id number] DOB: [birth year] DOA: 06/10/2016 PCP: Hollace Kinnier, DO  Brief Narrative: [name] is a 75 y.o. female with a medical history significant for, but not  limited to, Parkinson's and chronic diastolic heart failure. Patient was hospitalzed for a week earlier this month for acute on chronic heart failure  her EF had dropped from 60 to 45% with wall motion abnormalities on echo. Cardiology evaluated but no further workup was done. She improved with lasix, beta blocker, fluid / salt restriction. Patient apparently lives a lone but has a part time caregiver. Patient fell at home 9/18 am. She was found down on floor by her caregiver and brought to the ER  Assessment & Plan:    Possible Syncope -etiology unclear -recent ECHO 9/4 with drop in EF to 45% and diffuse hypokinesis -continue Metoprolol, Orthostatics negative -stop IVF -monitor on Tele    Chronic systolic CHF -recently admitted for Acute CHF exacerbation, followed by Cards -not volume overloaded at this time    Essential hypertension -stable, hold ACE, continue metoprolol    Cellulitis of left leg -continue IV Doxycycline for now -monitor clinically    Parkinsons disease -stable, continue sinemet  DVT prophylaxis:Lovenox Code Status:Full Code Family Communication:None at bedside Disposition Plan:Home health vs [name]   Antimicrobials:Doxycycline  Subjective: Feels ok, no complaints  Objective: Vitals:   06/11/16 0134 06/11/16 0359 06/11/16 0500 06/11/16 1355  BP: 134/73 127/66  108/78  Pulse: 79 67  73  Resp:  18  18  Temp:  97.4 F (36.3 C)  97.4 F (36.3 C)  TempSrc:  Oral  Oral  SpO2: 96% 95%  94%  Weight:   75.4 kg (166 lb 3.6 oz)     Intake/Output Summary (Last 24 hours) at 06/11/16 1536 Last data filed at 06/11/16 [id number]  Gross per 24 hour  Intake              500 ml  Output                0 ml  Net              500 ml   Filed Weights   06/11/16 0500    Weight: 75.4 kg (166 lb 3.6 oz)    Examination:  General exam: Appears calm and comfortable, chronically ill appearing   Respiratory system: Clear to auscultation. Respiratory effort normal. Cardiovascular system: S1 & S2 heard, RRR. No JVD, murmurs, rubs, gallops or clicks. No pedal edema. Gastrointestinal system: Abdomen is nondistended, soft and nontender. Normal bowel sounds heard. Central nervous system: Alert and oriented. No focal neurological deficits. Extremities: Symmetric 5 x 5 power. Skin: No rashes, lesions or ulcers Psychiatry: Judgement and insight appear normal. Mood & affect appropriate.     Data Reviewed: I have personally reviewed following labs and imaging studies  CBC:  Recent Labs Lab 06/06/16 0935 06/10/16 0957 06/11/16 0419  WBC 11.6* 7.1 7.7  NEUTROABS 9,628* 4.9  --   HGB 10.5* 11.8* 10.0*  HCT 32.2* 39.3 33.0*  MCV 73.5* 79.6 79.3  PLT 167 190 [id number]   Basic Metabolic Panel:  Recent Labs Lab 06/06/16 0935 06/10/16 0957 06/11/16 0419  NA 135 143 143  K 3.9 3.9 4.0  CL 97* 108 107  CO2 26 24 29   GLUCOSE 116* 125* 103*  BUN 95* 35* 20  CREATININE 1.68* 0.96 0.84  CALCIUM 8.8 9.5 8.7*   GFR: Estimated Creatinine Clearance: 56.7 mL/min (  by C-G formula based on SCr of 0.84 mg/dL). Liver Function Tests:  Recent Labs Lab 06/06/16 0935 06/10/16 0957  AST 16 22  ALT 7 13*  ALKPHOS 66 63  BILITOT 0.5 0.5  PROT 6.4 7.5  ALBUMIN 3.7 3.6   No results for input(s): LIPASE, AMYLASE in the last 168 hours. No results for input(s): AMMONIA in the last 168 hours. Coagulation Profile: No results for input(s): INR, PROTIME in the last 168 hours. Cardiac Enzymes: No results for input(s): CKTOTAL, CKMB, CKMBINDEX, TROPONINI in the last 168 hours. BNP (last 3 results) No results for input(s): PROBNP in the last 8760 hours. HbA1C: No results for input(s): HGBA1C in the last 72 hours. CBG:  Recent Labs Lab 06/10/16 1505 06/11/16 0555   GLUCAP 93 96   Lipid Profile: No results for input(s): CHOL, HDL, LDLCALC, TRIG, CHOLHDL, LDLDIRECT in the last 72 hours. Thyroid Function Tests: No results for input(s): TSH, T4TOTAL, FREET4, T3FREE, THYROIDAB in the last 72 hours. Anemia Panel: No results for input(s): VITAMINB12, FOLATE, FERRITIN, TIBC, IRON, RETICCTPCT in the last 72 hours. Urine analysis:    Component Value Date/Time   COLORURINE YELLOW 06/10/2016 1118   APPEARANCEUR CLEAR 06/10/2016 1118   LABSPEC 1.020 06/10/2016 1118   PHURINE 5.5 06/10/2016 1118   GLUCOSEU NEGATIVE 06/10/2016 1118   HGBUR NEGATIVE 06/10/2016 1118   BILIRUBINUR NEGATIVE 06/10/2016 1118   KETONESUR NEGATIVE 06/10/2016 1118   PROTEINUR NEGATIVE 06/10/2016 1118   UROBILINOGEN 0.2 04/27/2015 1416   NITRITE NEGATIVE 06/10/2016 1118   LEUKOCYTESUR NEGATIVE 06/10/2016 1118   Sepsis Labs: @LABRCNTIP (procalcitonin:4,lacticidven:4)  ) Recent Results (from the past 240 hour(s))  Culture, blood (routine x 2)     Status: None (Preliminary result)   Collection Time: 06/10/16  1:50 PM  Result Value Ref Range Status   Specimen Description BLOOD RIGHT ANTECUBITAL  Final   Special Requests BOTTLES DRAWN AEROBIC ONLY 5CC  Final   Culture NO GROWTH < 24 HOURS  Final   Report Status PENDING  Incomplete  Culture, blood (routine x 2)     Status: None (Preliminary result)   Collection Time: 06/10/16  1:55 PM  Result Value Ref Range Status   Specimen Description BLOOD LEFT HAND  Final   Special Requests IN PEDIATRIC BOTTLE 2CC  Final   Culture NO GROWTH < 24 HOURS  Final   Report Status PENDING  Incomplete         Radiology Studies: Ct Angio Head W Or Wo Contrast  Result Date: 06/10/2016 CLINICAL DATA:  Altered mental status.  Found on the floor. EXAM: CT ANGIOGRAPHY HEAD TECHNIQUE: Multidetector CT imaging of the head was performed using the standard protocol during bolus administration of intravenous contrast. Multiplanar CT image  reconstructions and MIPs were obtained to evaluate the vascular anatomy. CONTRAST:  50 cc Isovue 370 COMPARISON:  None. FINDINGS: CTA HEAD Anterior circulation: Internal carotid arteries are widely patent through the skullbase. Ordinary atherosclerotic calcification in the siphon regions but without stenosis greater than 30%. The anterior and middle cerebral vessels are patent without proximal stenosis, aneurysm or vascular malformation. Posterior circulation: Both vertebral arteries are patent through the foramen magnum, with the left being dominant. No basilar stenosis. Posterior circulation branch vessels are normal. Venous sinuses: Patent and normal Anatomic variants: None significant Delayed phase:No abnormal enhancement IMPRESSION: Negative CT angiography of the large and medium size vessels. Ordinary siphon atherosclerosis. Electronically Signed   By: Nelson Chimes M.D.   On: 06/10/2016 14:50   Dg Chest  2 View  Result Date: 06/10/2016 CLINICAL DATA:  Status post fall.  Right hip pain. EXAM: CHEST  2 VIEW COMPARISON:  06/01/2016 FINDINGS: There is mild bilateral interstitial thickening. There is no focal parenchymal opacity. There is no pleural effusion or pneumothorax. There is stable cardiomegaly. The osseous structures are unremarkable. IMPRESSION: Cardiomegaly with mild pulmonary vascular congestion. Electronically Signed   By: Kathreen Devoid   On: 06/10/2016 10:43   Ct Head Wo Contrast  Result Date: 06/10/2016 CLINICAL DATA:  75 year old female status post fall from recliner this morning. Possible syncope. Initial encounter. EXAM: CT HEAD WITHOUT CONTRAST CT CERVICAL SPINE WITHOUT CONTRAST TECHNIQUE: Multidetector CT imaging of the head and cervical spine was performed following the standard protocol without intravenous contrast. Multiplanar CT image reconstructions of the cervical spine were also generated. COMPARISON:  Head and cervical spine CT 07/30/2013. FINDINGS: CT HEAD FINDINGS Brain:  Confluent hypodensity in the left frontal operculum resembling a chronic cortically based infarct is new since 2014. No associated mass effect. Mild associated ex vacuo enlargement of the left lateral ventricle. No ventriculomegaly. Elsewhere gray-white matter differentiation is within normal limits. No midline shift, mass effect, or evidence of intracranial mass lesion. No acute intracranial hemorrhage identified. Vascular: Calcified atherosclerosis at the skull base. Skull: Stable and intact. Sinuses/Orbits: Visualized paranasal sinuses and mastoids are stable and well pneumatized. Other: No acute orbit or scalp soft tissue findings identified. Multiple calcified benign-appearing scalp nodules re- demonstrated. CT CERVICAL SPINE FINDINGS Alignment: Stable cervical vertebral height and alignment with some straightening of lordosis and mild spondylolisthesis. Cervicothoracic junction alignment is within normal limits. Bilateral posterior element alignment is within normal limits. Skull base and vertebrae: Visualized skull base is intact. No atlanto-occipital dissociation. No acute cervical spine fracture identified. Grossly stable and intact visualized upper thoracic levels. Soft tissues and spinal canal: Chronic left thyroid goiter with multiple nodules. Stable mild mass effect on the trachea at the thoracic inlet with no airway narrowing. Surgical clips from previous right thyroidectomy. Disc levels: Chronic mild degenerative spinal stenosis suspected at C5-C6. Upper chest: Stable mild apical scarring. Other: None. IMPRESSION: 1. 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 2. No acute intracranial abnormality. 3. No acute fracture or listhesis identified in the cervical spine. Ligamentous injury is not excluded. Chronic cervical spine degeneration appears stable. 4. 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 Electronically Signed   By: Genevie Ann M.D.   On: 06/10/2016 11:13   Ct  Cervical Spine Wo Contrast  Result Date: 06/10/2016 CLINICAL DATA:  75 year old female status post fall from recliner this morning. Possible syncope. Initial encounter. EXAM: CT HEAD WITHOUT CONTRAST CT CERVICAL SPINE WITHOUT CONTRAST TECHNIQUE: Multidetector CT imaging of the head and cervical spine was performed following the standard protocol without intravenous contrast. Multiplanar CT image reconstructions of the cervical spine were also generated. COMPARISON:  Head and cervical spine CT 07/30/2013. FINDINGS: CT HEAD FINDINGS Brain: Confluent hypodensity in the left frontal operculum resembling a chronic cortically based infarct is new since 2014. No associated mass effect. Mild associated ex vacuo enlargement of the left lateral ventricle. No ventriculomegaly. Elsewhere gray-white matter differentiation is within normal limits. No midline shift, mass effect, or evidence of intracranial mass lesion. No acute intracranial hemorrhage identified. Vascular: Calcified atherosclerosis at the skull base. Skull: Stable and intact. Sinuses/Orbits: Visualized paranasal sinuses and mastoids are stable and well pneumatized. Other: No acute orbit or scalp soft tissue findings identified. Multiple calcified benign-appearing scalp nodules re- demonstrated. CT  CERVICAL SPINE FINDINGS Alignment: Stable cervical vertebral height and alignment with some straightening of lordosis and mild spondylolisthesis. Cervicothoracic junction alignment is within normal limits. Bilateral posterior element alignment is within normal limits. Skull base and vertebrae: Visualized skull base is intact. No atlanto-occipital dissociation. No acute cervical spine fracture identified. Grossly stable and intact visualized upper thoracic levels. Soft tissues and spinal canal: Chronic left thyroid goiter with multiple nodules. Stable mild mass effect on the trachea at the thoracic inlet with no airway narrowing. Surgical clips from previous right  thyroidectomy. Disc levels: Chronic mild degenerative spinal stenosis suspected at C5-C6. Upper chest: Stable mild apical scarring. Other: None. IMPRESSION: 1. 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 2. No acute intracranial abnormality. 3. No acute fracture or listhesis identified in the cervical spine. Ligamentous injury is not excluded. Chronic cervical spine degeneration appears stable. 4. 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 Electronically Signed   By: Genevie Ann M.D.   On: 06/10/2016 11:13   Mr Jeri Cos [id number] Contrast  Result Date: 06/11/2016 CLINICAL DATA:  Initial evaluation for syncope. EXAM: MRI HEAD WITHOUT AND WITH CONTRAST TECHNIQUE: Multiplanar, multiecho pulse sequences of the brain and surrounding structures were obtained without and with intravenous contrast. CONTRAST:  36mL MULTIHANCE GADOBENATE DIMEGLUMINE 529 MG/ML IV SOLN COMPARISON:  Prior CTs from 06/10/2016. FINDINGS: Brain: Study degraded by motion artifact. Mild diffuse prominence of the CSF containing spaces is compatible with generalized age-related cerebral atrophy. Mild patchy T2/FLAIR hyperintensity within the periventricular deep white matter both cerebral hemispheres present, nonspecific, but most likely related to mild chronic microvascular ischemic changes. Focal encephalomalacia with gliosis involving the cortical gray matter of the anterior left frontal lobe compatible with remote infarct. Associated small amount of chronic hemosiderin staining present within this region. Probable additional small remote cortical infarct within the left parietal lobe (series 5, image 18). No abnormal foci of restricted diffusion to suggest acute or subacute ischemia. Gray-white matter differentiation maintained. No evidence for acute intracranial hemorrhage. No mass lesion, midline shift, or mass effect. No hydrocephalus. No extra-axial fluid collection. Major dural sinuses are grossly patent. No abnormal  enhancement. 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 This is of doubtful clinical significance. Pituitary infundibulum midline. Vascular: Major intracranial vascular flow voids well preserved. Skull and upper cervical spine: Craniocervical junction normal. Upper cervical spine grossly unremarkable without significant stenosis or other acute abnormality. Bone marrow signal intensity within normal limits. Multiple soft tissue nodules within the scalp noted, likely sebaceous cyst. Sinuses/Orbits: Globes and orbits within normal limits. Scattered mucosal thickening within the paranasal sinuses. No air-fluid level to suggest active sinus infection. No mastoid effusion. Inner ear structures grossly normal. Other: No other significant finding. IMPRESSION: 1. No acute intracranial process identified. 2. Remote left frontal and smaller left parietal cortical infarcts. 3. Mild chronic microvascular ischemic disease. Electronically Signed   By: Jeannine Boga M.D.   On: 06/11/2016 04:18   Dg Hip Unilat W Or Wo Pelvis 2-3 Views Right  Result Date: 06/10/2016 CLINICAL DATA:  Fall today.  Right hip pain. EXAM: DG HIP (WITH OR WITHOUT PELVIS) 2-3V RIGHT COMPARISON:  None. FINDINGS: A right hip hemiarthroplasty is noted. The hip is located. The femoral component is well seated. There is no associated fracture. The pelvis is otherwise intact. Moderate osteopenia is noted. Degenerative changes are present in the lower lumbar spine. IMPRESSION: 1.  Right hip hemiarthroplasty without complication. 2. No acute fracture. 3. Moderate osteopenia. Electronically Signed   By: San Morelle M.D.   On: 06/10/2016 10:43        Scheduled Meds: . aspirin EC  81 mg Oral Daily  . carbidopa-levodopa  1 tablet Oral 3 times per day  . carbidopa-levodopa  1.5 tablet Oral 3 times per day  . docusate sodium  100  mg Oral Daily  . doxycycline (VIBRAMYCIN) IV  100 mg Intravenous Q12H  . enoxaparin (LOVENOX) injection  40 mg Subcutaneous Q24H  . latanoprost  1 drop Left Eye QHS  . metoprolol tartrate  12.5 mg Oral BID  . pramipexole  1 mg Oral TID  . sodium chloride flush  3 mL Intravenous Q12H  . sodium chloride flush  3 mL Intravenous Q12H   Continuous Infusions:    LOS: 1 day    Time spent: 47min    Domenic Polite, MD [facility] Pager [phone]  If 7PM-7AM, please contact night-coverage [email or web address] Password [facility] 06/11/2016, 3:36 PM

## 2016-06-11 NOTE — Consult Note (Signed)
   [facility] Inpatient Consult   06/11/2016  [name] 01-Sep-1941 [id number]   Patient is currently active with [facility] for chronic disease management services.  Patient has been engaged by a [employer] and CSW.  Our community based plan of care has focused on disease management and community resource support.  Active consent on file.  Chart review reveals the patient is a 75 y.o. female with history of Parkinson's disease, congestive heart failure, anemia, hypertension, CVA, recently discharged from the hospital for pneumonia/CHF, presents to emergency department after a possible syncopal episode.  Met with the patient at the bedside.  She states she would like ongoing follow up with [facility].   Patient will receive a post discharge follow up and will be evaluated for monthly home visits depending on discharge disposition for assessments and disease process education.  Made Inpatient Case Manager aware that [facility] following. Of note, [facility] Care Management services does not replace or interfere with any services that are needed or arranged by inpatient case management or social work.  For additional questions or referrals please contact:  Natividad Brood, RN BSN [facility] Liaison  [phone] business mobile phone Toll free office [phone]

## 2016-06-12 DIAGNOSIS — E568 Deficiency of other vitamins: Secondary | ICD-10-CM | POA: Diagnosis not present

## 2016-06-12 DIAGNOSIS — H353121 Nonexudative age-related macular degeneration, left eye, early dry stage: Secondary | ICD-10-CM | POA: Diagnosis not present

## 2016-06-12 DIAGNOSIS — H2513 Age-related nuclear cataract, bilateral: Secondary | ICD-10-CM | POA: Diagnosis not present

## 2016-06-12 DIAGNOSIS — K219 Gastro-esophageal reflux disease without esophagitis: Secondary | ICD-10-CM | POA: Diagnosis not present

## 2016-06-12 DIAGNOSIS — H4032X1 Glaucoma secondary to eye trauma, left eye, mild stage: Secondary | ICD-10-CM | POA: Diagnosis not present

## 2016-06-12 DIAGNOSIS — M545 Low back pain: Secondary | ICD-10-CM | POA: Diagnosis not present

## 2016-06-12 DIAGNOSIS — R269 Unspecified abnormalities of gait and mobility: Secondary | ICD-10-CM | POA: Diagnosis not present

## 2016-06-12 DIAGNOSIS — H43813 Vitreous degeneration, bilateral: Secondary | ICD-10-CM | POA: Diagnosis not present

## 2016-06-12 DIAGNOSIS — G20 Parkinson's disease: Secondary | ICD-10-CM | POA: Diagnosis not present

## 2016-06-12 DIAGNOSIS — M6281 Muscle weakness (generalized): Secondary | ICD-10-CM | POA: Diagnosis not present

## 2016-06-12 DIAGNOSIS — F411 Generalized anxiety disorder: Secondary | ICD-10-CM | POA: Diagnosis not present

## 2016-06-12 DIAGNOSIS — R4182 Altered mental status, unspecified: Secondary | ICD-10-CM | POA: Diagnosis not present

## 2016-06-12 DIAGNOSIS — R55 Syncope and collapse: Secondary | ICD-10-CM | POA: Diagnosis not present

## 2016-06-12 DIAGNOSIS — G8929 Other chronic pain: Secondary | ICD-10-CM | POA: Diagnosis not present

## 2016-06-12 DIAGNOSIS — I5022 Chronic systolic (congestive) heart failure: Secondary | ICD-10-CM | POA: Diagnosis not present

## 2016-06-12 DIAGNOSIS — H5213 Myopia, bilateral: Secondary | ICD-10-CM | POA: Diagnosis not present

## 2016-06-12 DIAGNOSIS — L03116 Cellulitis of left lower limb: Secondary | ICD-10-CM | POA: Diagnosis not present

## 2016-06-12 DIAGNOSIS — I130 Hypertensive heart and chronic kidney disease with heart failure and stage 1 through stage 4 chronic kidney disease, or unspecified chronic kidney disease: Secondary | ICD-10-CM | POA: Diagnosis not present

## 2016-06-12 DIAGNOSIS — D649 Anemia, unspecified: Secondary | ICD-10-CM | POA: Diagnosis not present

## 2016-06-12 DIAGNOSIS — B999 Unspecified infectious disease: Secondary | ICD-10-CM | POA: Diagnosis not present

## 2016-06-12 DIAGNOSIS — R471 Dysarthria and anarthria: Secondary | ICD-10-CM | POA: Diagnosis not present

## 2016-06-12 DIAGNOSIS — Z885 Allergy status to narcotic agent status: Secondary | ICD-10-CM | POA: Diagnosis not present

## 2016-06-12 DIAGNOSIS — H44512 Absolute glaucoma, left eye: Secondary | ICD-10-CM | POA: Diagnosis not present

## 2016-06-12 DIAGNOSIS — I8312 Varicose veins of left lower extremity with inflammation: Secondary | ICD-10-CM | POA: Diagnosis not present

## 2016-06-12 DIAGNOSIS — R262 Difficulty in walking, not elsewhere classified: Secondary | ICD-10-CM | POA: Diagnosis not present

## 2016-06-12 DIAGNOSIS — R5381 Other malaise: Secondary | ICD-10-CM | POA: Diagnosis not present

## 2016-06-12 DIAGNOSIS — F419 Anxiety disorder, unspecified: Secondary | ICD-10-CM | POA: Diagnosis not present

## 2016-06-12 DIAGNOSIS — I10 Essential (primary) hypertension: Secondary | ICD-10-CM | POA: Diagnosis not present

## 2016-06-12 DIAGNOSIS — D509 Iron deficiency anemia, unspecified: Secondary | ICD-10-CM | POA: Diagnosis not present

## 2016-06-12 DIAGNOSIS — I8311 Varicose veins of right lower extremity with inflammation: Secondary | ICD-10-CM | POA: Diagnosis not present

## 2016-06-12 DIAGNOSIS — H353112 Nonexudative age-related macular degeneration, right eye, intermediate dry stage: Secondary | ICD-10-CM | POA: Diagnosis not present

## 2016-06-12 DIAGNOSIS — B3741 Candidal cystitis and urethritis: Secondary | ICD-10-CM | POA: Diagnosis not present

## 2016-06-12 DIAGNOSIS — Z880 Allergy status to penicillin: Secondary | ICD-10-CM | POA: Diagnosis not present

## 2016-06-12 DIAGNOSIS — I5023 Acute on chronic systolic (congestive) heart failure: Secondary | ICD-10-CM | POA: Diagnosis not present

## 2016-06-12 DIAGNOSIS — K5909 Other constipation: Secondary | ICD-10-CM | POA: Diagnosis not present

## 2016-06-12 LAB — BASIC METABOLIC PANEL
Anion gap: 6 (ref 5–15)
BUN: 18 mg/dL (ref 6–20)
CALCIUM: 8.8 mg/dL — AB (ref 8.9–10.3)
CO2: 27 mmol/L (ref 22–32)
CREATININE: 0.83 mg/dL (ref 0.44–1.00)
Chloride: 108 mmol/L (ref 101–111)
GFR calc non Af Amer: >60 mL/min (ref >60)
Glucose, Bld: 115 mg/dL — ABNORMAL HIGH (ref 65–99)
Potassium: 3.6 mmol/L (ref 3.5–5.1)
SODIUM: 141 mmol/L (ref 135–145)

## 2016-06-12 LAB — GLUCOSE, CAPILLARY: GLUCOSE-CAPILLARY: 197 mg/dL — AB (ref 65–99)

## 2016-06-12 LAB — CBC
HCT: 34.2 % — ABNORMAL LOW (ref 36.0–46.0)
Hemoglobin: 10.3 g/dL — ABNORMAL LOW (ref 12.0–15.0)
MCH: 23.8 pg — AB (ref 26.0–34.0)
MCHC: 30.1 g/dL (ref 30.0–36.0)
MCV: 79.2 fL (ref 78.0–100.0)
PLATELETS: 183 10*3/uL (ref 150–400)
RBC: 4.32 MIL/uL (ref 3.87–5.11)
RDW: 20.3 % — ABNORMAL HIGH (ref 11.5–15.5)
WBC: 10 10*3/uL (ref 4.0–10.5)

## 2016-06-12 MED ORDER — METOPROLOL TARTRATE 12.5 MG HALF TABLET
12.5000 mg | ORAL_TABLET | Freq: Once | ORAL | Status: AC
  Administered 2016-06-12: 12.5 mg via ORAL
  Filled 2016-06-12: qty 1

## 2016-06-12 MED ORDER — FUROSEMIDE 40 MG PO TABS: 40.0000 mg | ORAL_TABLET | Freq: Every day | ORAL | 0 refills | Status: DC

## 2016-06-12 MED ORDER — METOPROLOL TARTRATE 25 MG PO TABS: 25.0000 mg | ORAL_TABLET | Freq: Two times a day (BID) | ORAL | Status: DC

## 2016-06-12 MED ORDER — LORAZEPAM 0.5 MG PO TABS: 0.5000 mg | ORAL_TABLET | Freq: Two times a day (BID) | ORAL | 0 refills | Status: DC | PRN

## 2016-06-12 MED ORDER — OXYCODONE HCL 5 MG PO TABS: 5.0000 mg | ORAL_TABLET | Freq: Two times a day (BID) | ORAL | 0 refills | Status: DC | PRN

## 2016-06-12 MED ORDER — DOXYCYCLINE HYCLATE 100 MG PO TABS
100.0000 mg | ORAL_TABLET | Freq: Two times a day (BID) | ORAL | Status: DC
  Administered 2016-06-12: 100 mg via ORAL
  Filled 2016-06-12: qty 1

## 2016-06-12 MED ORDER — METOPROLOL TARTRATE 25 MG PO TABS: 25.0000 mg | ORAL_TABLET | Freq: Two times a day (BID) | ORAL | 0 refills | Status: DC

## 2016-06-12 NOTE — Progress Notes (Signed)
[name] to be D/C'd to [facility] [name] per MD order.  Discussed with the patient and all questions fully answered.  VSS, Skin clean, dry and intact without evidence of skin break down, no evidence of skin tears noted. IV catheter discontinued intact. Site without signs and symptoms of complications. Dressing and pressure applied.  An After Visit Summary was printed and given to the patient. PTAR received prescriptions.  D/c education completed with patient/family including follow up instructions, medication list, d/c activities limitations if indicated, with other d/c instructions as indicated by MD - patient able to verbalize understanding, all questions fully answered.   Patient instructed to return to ED, call 911, or call MD for any changes in condition.   Patient escorted via stretcher, and D/C home via PTAR.  [name] 06/12/2016 3:00 PM

## 2016-06-12 NOTE — Progress Notes (Signed)
[facility] will discharge to [facility] Anticipated discharge date: 9/20 Family notified: pt brother Bobbie Stack by [employer]- called at 2:40pm  Bradley signing off.  Jorge Ny, [place] Social Worker [phone]

## 2016-06-12 NOTE — Evaluation (Signed)
Occupational Therapy Evaluation Patient Details Name: [name] MRN: [id number] DOB: 08/06/1941 Today's Date: 06/12/2016    History of Present Illness 75 y.o. female with history of Parkinson's disease, congestive heart failure, anemia, hypertension, CVA, recently discharged from the hospital for pneumonia/CHF, presents to emergency department after a possible syncopal episode   Clinical Impression   Pt was admitted for the above.  She was mod I with adls prior to admission and is very motivated to return to PLOF. She will benefit from continued OT in acute setting with min guard level goals and follow up OT at [name] to restore her baseline functioning    Follow Up Recommendations  [name]    Equipment Recommendations  3 in 1 bedside comode    Recommendations for Other Services       Precautions / Restrictions Precautions Precautions: Fall Precaution Comments: H/o falls at home. Patient with significant dyskinesia throughout Restrictions Weight Bearing Restrictions: No      Mobility Bed Mobility     Rolling: Min assist   Supine to sit: Min assist Sit to supine: Min assist   General bed mobility comments: assist for trunk sitting up and legs for back to bed  Transfers   Equipment used: Rolling walker (2 wheeled) Transfers: Sit to/from Stand Sit to Stand: Min assist;From elevated surface         General transfer comment: pt pulled up on RW:  she is used to 4 wheel walker    Balance     Sitting balance-Leahy Scale: Fair Sitting balance - Comments: min guard for safety: pt had difficulty keeping bil LEs on floor   Standing balance support: Bilateral upper extremity supported Standing balance-Leahy Scale: Poor Standing balance comment: posterior LOB                            ADL Overall ADL's : Needs assistance/impaired     Grooming: Set up;Sitting   Upper Body Bathing: Set up;Sitting   Lower Body Bathing: Minimal assistance;Sit to/from  stand;With adaptive equipment   Upper Body Dressing : Set up;Standing   Lower Body Dressing: Minimal assistance;With adaptive equipment;Sit to/from stand                 General ADL Comments: performed sit to stand only: pt reports she was up in chair earlier and had no toileting needs     Vision     Perception     Praxis      Pertinent Vitals/Pain Pain Assessment: Faces Faces Pain Scale: Hurts little more Pain Location: back Pain Descriptors / Indicators: Sore Pain Intervention(s): Limited activity within patient's tolerance;Monitored during session;Repositioned     Hand Dominance     Extremity/Trunk Assessment Upper Extremity Assessment Upper Extremity Assessment: Generalized weakness (grossly 4 to 4-/5)           Communication Communication Communication: No difficulties   Cognition Arousal/Alertness: Awake/alert Behavior During Therapy: WFL for tasks assessed/performed Overall Cognitive Status: Within Functional Limits for tasks assessed                     General Comments       Exercises       Shoulder Instructions      Home Living Family/patient expects to be discharged to:: Skilled nursing facility  Additional Comments: pt lives alone. She has sisters and a brother nearby.        Prior Functioning/Environment Level of Independence: Independent with assistive device(s);Needs assistance    ADL's / Homemaking Assistance Needed: pt reports mod I with adls; uses AE            OT Problem List: Decreased strength;Decreased activity tolerance;Impaired balance (sitting and/or standing);Pain;Decreased knowledge of use of DME or AE   OT Treatment/Interventions: Self-care/ADL training;DME and/or AE instruction;Patient/family education;Balance training;Therapeutic activities    OT Goals(Current goals can be found in the care plan section) Acute Rehab OT Goals Patient Stated Goal: get  strength back OT Goal Formulation: With patient Time For Goal Achievement: 06/19/16 ADL Goals Pt Will Perform Grooming: standing;with min guard assist Pt Will Perform Lower Body Bathing: with min guard assist;with adaptive equipment;sit to/from stand Pt Will Perform Lower Body Dressing: with min guard assist;with adaptive equipment;sit to/from stand Pt Will Transfer to Toilet: with min guard assist;ambulating;bedside commode Pt Will Perform Toileting - Clothing Manipulation and hygiene: with min guard assist;sit to/from stand Additional ADL Goal #1: pt will perform bed mobility at min guard level in preparation for adls  OT Frequency: Min 2X/week   Barriers to D/C:            Co-evaluation              End of Session    Activity Tolerance: Patient tolerated treatment well Patient left: in bed;with call bell/phone within reach;with bed alarm set   Time: 1345-1402 OT Time Calculation (min): 17 min Charges:  OT General Charges $OT Visit: 1 Procedure OT Evaluation $OT Eval Moderate Complexity: 1 Procedure G-Codes:    Tarea Skillman 29-Jun-2016, 2:58 PM Lesle Chris, OTR/L [phone] 06/29/2016

## 2016-06-12 NOTE — Care Management Note (Signed)
Case Management Note  Patient Details  Name: [name] MRN: [id number] Date of Birth: 04/16/1941  Subjective/Objective:      Pt admitted with syncopal episode ,history of Parkinson's disease, congestive heart failure, anemia, hypertension, CVA, recently discharged from the hospital for pneumonia/CHF. Lives alone. PTA pt states independent with ADL's. Owns electrical and manual wheelchair, and walker.        PCP: Hollace Kinnier  Action/Plan: Plan is for d/c to [name] today.  Expected Discharge Date:   06/12/2016           Expected Discharge Plan:  [place]  In-House Referral:  Clinical Social Work  Discharge planning Services  CM Consult   Status of Service:  Completed, signed off  If discussed at [employer] of Stay Meetings, dates discussed:    Additional Comments:  Sharin Mons, RN 06/12/2016, 12:18 PM

## 2016-06-12 NOTE — Clinical Social Work Placement (Signed)
   CLINICAL SOCIAL WORK PLACEMENT  NOTE  Date:  06/12/2016  Patient Details  Name: [name] MRN: [id number] Date of Birth: Oct 19, 1940  Clinical Social Work is seeking post-discharge placement for this patient at the [place] level of care (*CSW will initial, date and re-position this form in  chart as items are completed):  Yes   Patient/family provided with [place] Work Department's list of facilities offering this level of care within the geographic area requested by the patient (or if unable, by the patient's family).  Yes   Patient/family informed of their freedom to choose among providers that offer the needed level of care, that participate in Medicare, Medicaid or managed care program needed by the patient, have an available bed and are willing to accept the patient.  Yes   Patient/family informed of [facility]'s ownership interest in [facility] and [facility], as well as of the fact that they are under no obligation to receive care at these facilities.  PASRR submitted to EDS on       PASRR number received on       Existing PASRR number confirmed on 06/11/16     FL2 transmitted to all facilities in geographic area requested by pt/family on 06/11/16     FL2 transmitted to all facilities within larger geographic area on       Patient informed that his/her managed care company has contracts with or will negotiate with certain facilities, including the following:        Yes   Patient/family informed of bed offers received.  Patient chooses bed at [facility]     Physician recommends and patient chooses bed at      Patient to be transferred to [facility] on 06/12/16.  Patient to be transferred to facility by ptar     Patient family notified on 06/12/16 of transfer.  Name of family member notified:  william     PHYSICIAN Please sign FL2     Additional Comment:     _______________________________________________ Jorge Ny, LCSW 06/12/2016, 2:43 PM

## 2016-06-12 NOTE — Discharge Summary (Signed)
Physician Discharge Summary  [name] [id number] DOB: Jul 28, 1941 DOA: 06/10/2016  PCP: Hollace Kinnier, DO  Admit date: 06/10/2016 Discharge date: 06/12/2016  Time spent: 8minutes  Recommendations for Outpatient Follow-up:  1. PCP Dr.Reed in 1 week 2. Cardiology Dr.Croitoru in 2 weeks  Discharge Diagnoses:  Principal Problem:   Syncope   Parkinson's disease   Chronic venous stasis   Chronic systolic CHF-EF [id number]   Essential hypertension   Cellulitis of left leg   Fall   Near syncope   Altered mental state   Discharge Condition: stable  Diet recommendation: low sodium diet  Filed Weights   06/11/16 0500 06/12/16 0655  Weight: 75.4 kg (166 lb 3.6 oz) 76.2 kg (168 lb)    History of present illness:  [name] a 75 y.o.femalewith a medical history significant for, but not limited to, Parkinson's and chronic diastolic heart failure. Patient was hospitalzed for a week earlier this month for acute on chronic heart failure  her EF had dropped from 60 to 45% with wall motion abnormalities on echo. Cardiology evaluated but no further workup was done. She improved with lasix, beta blocker, fluid / salt restriction. Patient apparently lives a lone but has a part time caregiver. Patient fell at home 9/18 am. She was found down on floor by her caregiver and brought to the ER.  Hospital Course:  Possible Syncope -etiology unclear -recent ECHO 9/4 with drop in EF to 45% and diffuse hypokinesis -continued on Metoprolol, no significant events on tele except few PVCs, dose increased to 25mg  BID -Orthostatics negative -labs/and imagine studies done on admission were negative for acute findings -Pt/Ot eval completed and discharged to [name] in a stable condition, if she has another syncopal event would recommend an event monitor    Chronic systolic CHF -recently admitted for Acute CHF exacerbation, followed by Cards -not volume overloaded at this time -continue lasix dose changed  to 40mg  daily since she was dry on admission, may need dose titrated at FU    Essential hypertension -stable, stopped 2.5mg  lisinopril, continue metoprolol    Cellulitis of left leg -clinically suspect chronic venous stasis only, no evidence of infection    Parkinsons disease -stable, continue sinemet  Discharge Exam: Vitals:   06/12/16 0827 06/12/16 1322  BP: (!) 149/69 113/73  Pulse: 78 68  Resp: 18   Temp: 97.6 F (36.4 C) 98.1 F (36.7 C)    General: AAOx3 Cardiovascular: S1S2/RRR Respiratory: CTAB  Discharge Instructions   Discharge Instructions    Diet - low sodium heart healthy    Complete by:  As directed    Increase activity slowly    Complete by:  As directed      Current Discharge Medication List    CONTINUE these medications which have CHANGED   Details  furosemide (LASIX) 40 MG tablet Take 1 tablet (40 mg total) by mouth daily. Refills: 0   Associated Diagnoses: Acute on chronic diastolic CHF (congestive heart failure), NYHA class 3 (HCC)    LORazepam (ATIVAN) 0.5 MG tablet Take 1 tablet (0.5 mg total) by mouth 2 (two) times daily as needed for anxiety. Qty: 20 tablet, Refills: 0   Associated Diagnoses: Anxiety state    metoprolol tartrate (LOPRESSOR) 25 MG tablet Take 1 tablet (25 mg total) by mouth 2 (two) times daily. Refills: 0    oxyCODONE (OXY IR/ROXICODONE) 5 MG immediate release tablet Take 1 tablet (5 mg total) by mouth 2 (two) times daily as needed for severe pain.  Qty: 10 tablet, Refills: 0   Associated Diagnoses: Midline low back pain without sciatica      CONTINUE these medications which have NOT CHANGED   Details  aspirin EC 81 MG tablet Take 81 mg by mouth daily.    carbidopa-levodopa (SINEMET IR) 25-100 MG tablet Take 1 at 6a, 1 1/2 pills at 9a, 1 at noon, 1 1/2 pills at 3p, 1 at 6pm, 1 1/2 pills at 9pm Qty: 720 tablet, Refills: 3   Associated Diagnoses: Parkinson's disease (HCC)    Cholecalciferol (VITAMIN D) 2000 units  CAPS Take 2,000 Units by mouth daily. Reported on 04/10/2016    docusate sodium (COLACE) 100 MG capsule Take 100 mg by mouth daily. Reported on 02/28/2016    ferrous sulfate 325 (65 FE) MG EC tablet Take 1 tablet (325 mg total) by mouth daily. Qty: 90 tablet, Refills: 3    latanoprost (XALATAN) 0.005 % ophthalmic solution Place 1 drop into the left eye daily at 6 PM.     Multiple Vitamins-Minerals (MULTIVITAMIN WITH MINERALS) tablet Take 1 tablet by mouth daily.    nystatin cream (MYCOSTATIN) Apply 1 application topically 2 (two) times daily.    Polyethyl Glycol-Propyl Glycol (SYSTANE OP) Place 1 drop into both eyes daily.     pramipexole (MIRAPEX) 1 MG tablet Take 1 tablet (1 mg total) by mouth 3 (three) times daily. Qty: 270 tablet, Refills: 3   Associated Diagnoses: Parkinson's disease (HCC)    triamcinolone cream (KENALOG) 0.1 % Apply 1 application topically 2 (two) times daily.      STOP taking these medications     lisinopril (PRINIVIL,ZESTRIL) 2.5 MG tablet        Allergies  Allergen Reactions  . Codeine Other (See Comments)    Unknown allergic reaction  . Penicillins Other (See Comments)    Has patient had a PCN reaction causing immediate rash, facial/tongue/throat swelling, SOB or lightheadedness with hypotension: No Has patient had a PCN reaction causing severe rash involving mucus membranes or skin necrosis: No Has patient had a PCN reaction that required hospitalization No Has patient had a PCN reaction occurring within the last 10 years: No If all of the above answers are "NO", then may proceed with Cephalosporin use. Pt passed out in doctor's office after penicillin dose   Follow-up Information    Sanda Klein, MD. Schedule an appointment as soon as possible for a visit in 2 week(s).   Specialty:  Cardiology Contact information: [address] [place] [address] [place] [address] [phone]        REED, [place], DO. Schedule an appointment as soon as  possible for a visit in 1 week(s).   Specialty:  Geriatric Medicine Contact information: [address] [place] [address] [phone]            The results of significant diagnostics from this hospitalization (including imaging, microbiology, ancillary and laboratory) are listed below for reference.    Significant Diagnostic Studies: Ct Angio Head W Or Wo Contrast  Result Date: 06/10/2016 CLINICAL DATA:  Altered mental status.  Found on the floor. EXAM: CT ANGIOGRAPHY HEAD TECHNIQUE: Multidetector CT imaging of the head was performed using the standard protocol during bolus administration of intravenous contrast. Multiplanar CT image reconstructions and MIPs were obtained to evaluate the vascular anatomy. CONTRAST:  50 cc Isovue 370 COMPARISON:  None. FINDINGS: CTA HEAD Anterior circulation: Internal carotid arteries are widely patent through the skullbase. Ordinary atherosclerotic calcification in the siphon regions but without stenosis greater  than 30%. The anterior and middle cerebral vessels are patent without proximal stenosis, aneurysm or vascular malformation. Posterior circulation: Both vertebral arteries are patent through the foramen magnum, with the left being dominant. No basilar stenosis. Posterior circulation branch vessels are normal. Venous sinuses: Patent and normal Anatomic variants: None significant Delayed phase:No abnormal enhancement IMPRESSION: Negative CT angiography of the large and medium size vessels. Ordinary siphon atherosclerosis. Electronically Signed   By: Nelson Chimes M.D.   On: 06/10/2016 14:50   Dg Chest 2 View  Result Date: 06/10/2016 CLINICAL DATA:  Status post fall.  Right hip pain. EXAM: CHEST  2 VIEW COMPARISON:  06/01/2016 FINDINGS: There is mild bilateral interstitial thickening. There is no focal parenchymal opacity. There is no pleural effusion or pneumothorax. There is stable cardiomegaly. The osseous structures are unremarkable. IMPRESSION:  Cardiomegaly with mild pulmonary vascular congestion. Electronically Signed   By: Kathreen Devoid   On: 06/10/2016 10:43   Dg Chest 2 View  Result Date: 05/31/2016 CLINICAL DATA:  Sob for 5 days EXAM: CHEST  2 VIEW COMPARISON:  05/29/2016 FINDINGS: Lateral view degraded by patient arm position. Thoracolumbar vertebral augmentation. Patient rotated right on the frontal radiograph. Midline trachea. Moderate cardiomegaly. Moderate to large hiatal hernia. No pleural effusion or pneumothorax. Chronic interstitial thickening. Left base scarring. Vague increased density projecting over the right upper lobe on the frontal radiograph could be due to EKG lead artifact. IMPRESSION: Artifact versus right upper lobe airspace disease. Consider repeat radiograph after removal of EKG leads. Cardiomegaly with left base scarring.  No congestive failure. Hiatal hernia. Electronically Signed   By: Abigail Miyamoto M.D.   On: 05/31/2016 09:24   Dg Chest 2 View  Result Date: 05/25/2016 CLINICAL DATA:  Central chest pain since this morning EXAM: CHEST  2 VIEW COMPARISON:  August 30, 2015 FINDINGS: The heart size is enlarged. Mediastinal contour is normal. There is increased pulmonary interstitium bilaterally. Patchy opacity is identified in the left lung base. There is no pleural effusion. There is a hiatal hernia. The bony structures are stable. IMPRESSION: Cardiomegaly.  Interstitial edema. Patchy consolidation of left lung base pneumonia is not excluded. Electronically Signed   By: Abelardo Diesel M.D.   On: 05/25/2016 07:35   Ct Head Wo Contrast  Result Date: 06/10/2016 CLINICAL DATA:  75 year old female status post fall from recliner this morning. Possible syncope. Initial encounter. EXAM: CT HEAD WITHOUT CONTRAST CT CERVICAL SPINE WITHOUT CONTRAST TECHNIQUE: Multidetector CT imaging of the head and cervical spine was performed following the standard protocol without intravenous contrast. Multiplanar CT image reconstructions of the  cervical spine were also generated. COMPARISON:  Head and cervical spine CT 07/30/2013. FINDINGS: CT HEAD FINDINGS Brain: Confluent hypodensity in the left frontal operculum resembling a chronic cortically based infarct is new since 2014. No associated mass effect. Mild associated ex vacuo enlargement of the left lateral ventricle. No ventriculomegaly. Elsewhere gray-white matter differentiation is within normal limits. No midline shift, mass effect, or evidence of intracranial mass lesion. No acute intracranial hemorrhage identified. Vascular: Calcified atherosclerosis at the skull base. Skull: Stable and intact. Sinuses/Orbits: Visualized paranasal sinuses and mastoids are stable and well pneumatized. Other: No acute orbit or scalp soft tissue findings identified. Multiple calcified benign-appearing scalp nodules re- demonstrated. CT CERVICAL SPINE FINDINGS Alignment: Stable cervical vertebral height and alignment with some straightening of lordosis and mild spondylolisthesis. Cervicothoracic junction alignment is within normal limits. Bilateral posterior element alignment is within normal limits. Skull base and vertebrae: Visualized skull base  is intact. No atlanto-occipital dissociation. No acute cervical spine fracture identified. Grossly stable and intact visualized upper thoracic levels. Soft tissues and spinal canal: Chronic left thyroid goiter with multiple nodules. Stable mild mass effect on the trachea at the thoracic inlet with no airway narrowing. Surgical clips from previous right thyroidectomy. Disc levels: Chronic mild degenerative spinal stenosis suspected at C5-C6. Upper chest: Stable mild apical scarring. Other: None. IMPRESSION: 1. 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 2. No acute intracranial abnormality. 3. No acute fracture or listhesis identified in the cervical spine. Ligamentous injury is not excluded. Chronic cervical spine degeneration appears stable. 4.  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 Electronically Signed   By: Genevie Ann M.D.   On: 06/10/2016 11:13   Ct Cervical Spine Wo Contrast  Result Date: 06/10/2016 CLINICAL DATA:  75 year old female status post fall from recliner this morning. Possible syncope. Initial encounter. EXAM: CT HEAD WITHOUT CONTRAST CT CERVICAL SPINE WITHOUT CONTRAST TECHNIQUE: Multidetector CT imaging of the head and cervical spine was performed following the standard protocol without intravenous contrast. Multiplanar CT image reconstructions of the cervical spine were also generated. COMPARISON:  Head and cervical spine CT 07/30/2013. FINDINGS: CT HEAD FINDINGS Brain: Confluent hypodensity in the left frontal operculum resembling a chronic cortically based infarct is new since 2014. No associated mass effect. Mild associated ex vacuo enlargement of the left lateral ventricle. No ventriculomegaly. Elsewhere gray-white matter differentiation is within normal limits. No midline shift, mass effect, or evidence of intracranial mass lesion. No acute intracranial hemorrhage identified. Vascular: Calcified atherosclerosis at the skull base. Skull: Stable and intact. Sinuses/Orbits: Visualized paranasal sinuses and mastoids are stable and well pneumatized. Other: No acute orbit or scalp soft tissue findings identified. Multiple calcified benign-appearing scalp nodules re- demonstrated. CT CERVICAL SPINE FINDINGS Alignment: Stable cervical vertebral height and alignment with some straightening of lordosis and mild spondylolisthesis. Cervicothoracic junction alignment is within normal limits. Bilateral posterior element alignment is within normal limits. Skull base and vertebrae: Visualized skull base is intact. No atlanto-occipital dissociation. No acute cervical spine fracture identified. Grossly stable and intact visualized upper thoracic levels. Soft tissues and spinal canal: Chronic left thyroid goiter with multiple nodules. Stable  mild mass effect on the trachea at the thoracic inlet with no airway narrowing. Surgical clips from previous right thyroidectomy. Disc levels: Chronic mild degenerative spinal stenosis suspected at C5-C6. Upper chest: Stable mild apical scarring. Other: None. IMPRESSION: 1. 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 2. No acute intracranial abnormality. 3. No acute fracture or listhesis identified in the cervical spine. Ligamentous injury is not excluded. Chronic cervical spine degeneration appears stable. 4. 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 Electronically Signed   By: Genevie Ann M.D.   On: 06/10/2016 11:13   Mr Jeri Cos [id number] Contrast  Result Date: 06/11/2016 CLINICAL DATA:  Initial evaluation for syncope. EXAM: MRI HEAD WITHOUT AND WITH CONTRAST TECHNIQUE: Multiplanar, multiecho pulse sequences of the brain and surrounding structures were obtained without and with intravenous contrast. CONTRAST:  49mL MULTIHANCE GADOBENATE DIMEGLUMINE 529 MG/ML IV SOLN COMPARISON:  Prior CTs from 06/10/2016. FINDINGS: Brain: Study degraded by motion artifact. Mild diffuse prominence of the CSF containing spaces is compatible with generalized age-related cerebral atrophy. Mild patchy T2/FLAIR hyperintensity within the periventricular deep white matter both cerebral hemispheres present, nonspecific, but most likely related to mild chronic microvascular ischemic changes. Focal encephalomalacia with gliosis involving the cortical gray matter of the  anterior left frontal lobe compatible with remote infarct. Associated small amount of chronic hemosiderin staining present within this region. Probable additional small remote cortical infarct within the left parietal lobe (series 5, image 18). No abnormal foci of restricted diffusion to suggest acute or subacute ischemia. Gray-white matter differentiation maintained. No evidence for acute intracranial hemorrhage. No mass lesion, midline shift, or  mass effect. No hydrocephalus. No extra-axial fluid collection. Major dural sinuses are grossly patent. No abnormal enhancement. 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 This is of doubtful clinical significance. Pituitary infundibulum midline. Vascular: Major intracranial vascular flow voids well preserved. Skull and upper cervical spine: Craniocervical junction normal. Upper cervical spine grossly unremarkable without significant stenosis or other acute abnormality. Bone marrow signal intensity within normal limits. Multiple soft tissue nodules within the scalp noted, likely sebaceous cyst. Sinuses/Orbits: Globes and orbits within normal limits. Scattered mucosal thickening within the paranasal sinuses. No air-fluid level to suggest active sinus infection. No mastoid effusion. Inner ear structures grossly normal. Other: No other significant finding. IMPRESSION: 1. No acute intracranial process identified. 2. Remote left frontal and smaller left parietal cortical infarcts. 3. Mild chronic microvascular ischemic disease. Electronically Signed   By: Jeannine Boga M.D.   On: 06/11/2016 04:18   Dg Chest Port 1 View  Result Date: 06/01/2016 CLINICAL DATA:  Congestive heart failure exacerbation. EXAM: PORTABLE CHEST 1 VIEW COMPARISON:  Radiographs of May 31, 2016. FINDINGS: Stable cardiomediastinal silhouette. No pneumothorax or pleural effusion is noted. Right lung is clear. Stable left basilar subsegmental atelectasis or scarring is noted. Bony thorax is unremarkable. IMPRESSION: Stable left basilar subsegmental atelectasis or scarring. Electronically Signed   By: Marijo Conception, M.D.   On: 06/01/2016 09:52   Dg Chest Port 1 View  Result Date: 05/29/2016 CLINICAL DATA:  Shortness of breath for 1 day EXAM: PORTABLE CHEST 1 VIEW COMPARISON:  May 25, 2016 FINDINGS: The degree of  inspiration is shallow. There is atelectatic change in the left base. The lungs elsewhere clear. Heart is mildly prominent with pulmonary vascularity within normal limits. No adenopathy. There is degenerative change in each shoulder. No pneumothorax but IMPRESSION: No base atelectasis. Lungs elsewhere clear. Stable cardiac silhouette. Note shallow degree of inspiration. Electronically Signed   By: Lowella Grip III M.D.   On: 05/29/2016 08:26   Dg Hip Unilat W Or Wo Pelvis 2-3 Views Right  Result Date: 06/10/2016 CLINICAL DATA:  Fall today.  Right hip pain. EXAM: DG HIP (WITH OR WITHOUT PELVIS) 2-3V RIGHT COMPARISON:  None. FINDINGS: A right hip hemiarthroplasty is noted. The hip is located. The femoral component is well seated. There is no associated fracture. The pelvis is otherwise intact. Moderate osteopenia is noted. Degenerative changes are present in the lower lumbar spine. IMPRESSION: 1. Right hip hemiarthroplasty without complication. 2. No acute fracture. 3. Moderate osteopenia. Electronically Signed   By: San Morelle M.D.   On: 06/10/2016 10:43    Microbiology: Recent Results (from the past 240 hour(s))  Culture, blood (routine x 2)     Status: None (Preliminary result)   Collection Time: 06/10/16  1:50 PM  Result Value Ref Range Status   Specimen Description BLOOD RIGHT ANTECUBITAL  Final   Special Requests BOTTLES DRAWN AEROBIC ONLY 5CC  Final   Culture NO GROWTH 2 DAYS  Final   Report Status PENDING  Incomplete  Culture, blood (routine x 2)  Status: None (Preliminary result)   Collection Time: 06/10/16  1:55 PM  Result Value Ref Range Status   Specimen Description BLOOD LEFT HAND  Final   Special Requests IN PEDIATRIC BOTTLE 2CC  Final   Culture NO GROWTH 2 DAYS  Final   Report Status PENDING  Incomplete     Labs: Basic Metabolic Panel:  Recent Labs Lab 06/06/16 0935 06/10/16 0957 06/11/16 0419 06/12/16 0627  NA 135 143 143 141  K 3.9 3.9 4.0 3.6  CL  97* 108 107 108  CO2 26 24 29 27   GLUCOSE 116* 125* 103* 115*  BUN 95* 35* 20 18  CREATININE 1.68* 0.96 0.84 0.83  CALCIUM 8.8 9.5 8.7* 8.8*   Liver Function Tests:  Recent Labs Lab 06/06/16 0935 06/10/16 0957  AST 16 22  ALT 7 13*  ALKPHOS 66 63  BILITOT 0.5 0.5  PROT 6.4 7.5  ALBUMIN 3.7 3.6   No results for input(s): LIPASE, AMYLASE in the last 168 hours. No results for input(s): AMMONIA in the last 168 hours. CBC:  Recent Labs Lab 06/06/16 0935 06/10/16 0957 06/11/16 0419 06/12/16 0627  WBC 11.6* 7.1 7.7 10.0  NEUTROABS 9,628* 4.9  --   --   HGB 10.5* 11.8* 10.0* 10.3*  HCT 32.2* 39.3 33.0* 34.2*  MCV 73.5* 79.6 79.3 79.2  PLT 167 190 192 183   Cardiac Enzymes:  Recent Labs Lab 06/11/16 0419  CKTOTAL 44   BNP: BNP (last 3 results)  Recent Labs  05/25/16 0635  BNP 294.0*    ProBNP (last 3 results) No results for input(s): PROBNP in the last 8760 hours.  CBG:  Recent Labs Lab 06/10/16 1505 06/11/16 0555 06/12/16 0911  GLUCAP 93 96 197*       SignedDomenic Polite MD.  [facility] 06/12/2016, 2:05 PM

## 2016-06-13 ENCOUNTER — Telehealth: Payer: Self-pay | Admitting: Internal Medicine

## 2016-06-13 ENCOUNTER — Other Ambulatory Visit: Payer: Self-pay | Admitting: *Deleted

## 2016-06-13 ENCOUNTER — Other Ambulatory Visit: Payer: Self-pay | Admitting: Licensed Clinical Social Worker

## 2016-06-13 NOTE — Patient Outreach (Addendum)
[facility]) Care Management  [facility] Social Work  06/13/2016  [name] 06/04/1941 [id number]   Encounter Medications:  Outpatient Encounter Prescriptions as of 06/13/2016  Medication Sig  . aspirin EC 81 MG tablet Take 81 mg by mouth daily.  . carbidopa-levodopa (SINEMET IR) 25-100 MG tablet Take 1 at 6a, 1 1/2 pills at 9a, 1 at noon, 1 1/2 pills at 3p, 1 at 6pm, 1 1/2 pills at 9pm  . Cholecalciferol (VITAMIN D) 2000 units CAPS Take 2,000 Units by mouth daily. Reported on 04/10/2016  . docusate sodium (COLACE) 100 MG capsule Take 100 mg by mouth daily. Reported on 02/28/2016  . ferrous sulfate 325 (65 FE) MG EC tablet Take 1 tablet (325 mg total) by mouth daily.  . furosemide (LASIX) 40 MG tablet Take 1 tablet (40 mg total) by mouth daily.  Marland Kitchen latanoprost (XALATAN) 0.005 % ophthalmic solution Place 1 drop into the left eye daily at 6 PM.   . LORazepam (ATIVAN) 0.5 MG tablet Take 1 tablet (0.5 mg total) by mouth 2 (two) times daily as needed for anxiety.  . metoprolol tartrate (LOPRESSOR) 25 MG tablet Take 1 tablet (25 mg total) by mouth 2 (two) times daily.  . Multiple Vitamins-Minerals (MULTIVITAMIN WITH MINERALS) tablet Take 1 tablet by mouth daily.  Marland Kitchen nystatin cream (MYCOSTATIN) Apply 1 application topically 2 (two) times daily.  Marland Kitchen oxyCODONE (OXY IR/ROXICODONE) 5 MG immediate release tablet Take 1 tablet (5 mg total) by mouth 2 (two) times daily as needed for severe pain.  Vladimir Faster Glycol-Propyl Glycol (SYSTANE OP) Place 1 drop into both eyes daily.   . pramipexole (MIRAPEX) 1 MG tablet Take 1 tablet (1 mg total) by mouth 3 (three) times daily.  Marland Kitchen triamcinolone cream (KENALOG) 0.1 % Apply 1 application topically 2 (two) times daily.   No facility-administered encounter medications on file as of 06/13/2016.     Functional Status:  In your present state of health, do you have any difficulty performing the following activities: 05/27/2016 05/27/2016  Hearing? N -   Vision? N -  Difficulty concentrating or making decisions? N -  Walking or climbing stairs? Y -  Dressing or bathing? Y -  Doing errands, shopping? Y Y  Preparing Food and eating ? - -  Using the Toilet? - -  In the past six months, have you accidently leaked urine? - -  Do you have problems with loss of bowel control? - -  Managing your Medications? - -  Managing your Finances? - -  Housekeeping or managing your Housekeeping? - -  Some recent data might be hidden    Fall/Depression Screening:  PHQ 2/9 Scores 06/06/2016 05/14/2016 03/29/2016 03/19/2016 02/21/2016 01/30/2016 01/26/2016  PHQ - 2 Score 0 0 0 0 0 0 0    Assessment: CSW completed [name] visit on 06/13/16 with patient at [facility]. Patient was recently admitted to hospital for AMS, syncope and fall. Patient was agreeable to [name] placement which has been strongly encouraged several times while she was hospitalized in the past but she has declined each time. Patient reports that with the encouragement of her brother she was willing to do [name] placement. Patient is in excellent spirits today. She seems to have made a close friend at facility which is her roommate. Patient is smiling, laughing and making jokes with her roommate. Patient seems to have benefited from this socialization. Patient reports that she will start physical therapy this afternoon which she is looking forward to.  Patient reports that her brother visited her yesterday and this morning at facility. She states that her caregiver saw her last week but has had health concerns is in [facility]. She reports that she is hopeful that her caregiver will continue to provide services to her once she discharges. Patient reports that she has already went to one social activity at the [name] and plans to go to another tomorrow which is the [place] encouraged her to take some time to consider long term placement while she is at [facility] since she seems to be enjoying the care and company  thus far.   CSW went to [name] discharge planner's office but she was not available. CSW had a to leave a written message with front desk encouraging call from discharge planner in order to provide updates as [name] employees do not have voice messages.   Plan: CSW will complete next outreach within one week. CSW will await for return call from [name] d/c planner.  [facility] CM Care Plan Problem Three   Flowsheet Row Most Recent Value  Care Plan for Problem Three  Active  [facility] Long Term Goal (31-90) days  Patient will implement healthy coping tools into her routine within 90 days as evidenced by her isolation and need for fulfilment.  [facility] Long Term Goal Start Date  05/14/16  Interventions for Problem Three Long Term Goal  CSW will complete home visit and encourage patient to do activities to improve her well being such as playing music to decrease sadness (patient recently got an ukulee, going to church, going out in the community and spending time with family.   [facility] CM Short Term Goal #1 (0-30 days)  Patient will have a safe and stable discharge back home from [name] within 30 days due to recent [name] admission at [place] [facility] Start Date  06/13/16  Interventions for Short Term Goal #1  CSW completed [name] visit and will coordinate care with [name] d/c planner to ensure that patient has needed medical equipment, services and resources to ensure a safe and stable discharge  [facility] CM Short Term Goal #2 (0-30 days)  Patient will continue to clean her home to allow her more mobility in her home as evidenced by her hoarding and need for more room in her home to prevent falls and danger  [facility] CM Short Term Goal #2 Start Date  05/14/16  [facility] CM Short Term Goal #2 Met Date  06/13/16  Interventions for Short Term Goal #2  CSW will provide emotional support and will encourage her to clean home as needed. Patient has a new caregiver who has already assisted her with cleaning her home but patient wishes to do more  within the next 30 days.       [name], BSW, MSW, [email or web address] Phone: [phone] Fax: [phone]

## 2016-06-13 NOTE — Telephone Encounter (Signed)
This is a patient of [place] who was admitted to [facility]** after hospitalization - [facility] is needed. Hospital discharge **06/12/16**

## 2016-06-13 NOTE — Patient Outreach (Signed)
[facility]) Care Management [place] Care Coordination Outreach 06/13/2016  RAYLENA CORNWALL [birth year] [id number]   "[name]" KARLEAH ADOLF is a 75 y.o. female well known to [place] for transition of care after multiple recent hospitalizations, as well as for safety concerns.  [facility] CSW is also actively [employer] care for [employer] evaluation.   Recent hospitalizations:  June 19-23, 2090for bilateral LE edema and redness (cellulitis)-- [name] refused by pt.. September 2-9, 2017 for acute on chronic diastolic CHF exacerbation-- [name] refused by pt. September 18-20, 2017 for AMS, syncope, and fall; patient agreed to [name] placement at [facility] center at the time of discharge.  Secure communication via EMR was sent to [place] notifying of patient's discharge to [name] yesterday.  Plan:  Will await follow up from [place] regarding patient progress while at [name]   [name], RN, BSN, [place] Care Management  [phone]

## 2016-06-15 LAB — CULTURE, BLOOD (ROUTINE X 2)
CULTURE: NO GROWTH
Culture: NO GROWTH

## 2016-06-17 ENCOUNTER — Encounter: Payer: Self-pay | Admitting: Adult Health

## 2016-06-17 ENCOUNTER — Non-Acute Institutional Stay (SKILLED_NURSING_FACILITY): Payer: Medicare Other | Admitting: Adult Health

## 2016-06-17 DIAGNOSIS — I5023 Acute on chronic systolic (congestive) heart failure: Secondary | ICD-10-CM

## 2016-06-17 DIAGNOSIS — D509 Iron deficiency anemia, unspecified: Secondary | ICD-10-CM

## 2016-06-17 DIAGNOSIS — I130 Hypertensive heart and chronic kidney disease with heart failure and stage 1 through stage 4 chronic kidney disease, or unspecified chronic kidney disease: Secondary | ICD-10-CM

## 2016-06-17 DIAGNOSIS — M545 Low back pain: Secondary | ICD-10-CM

## 2016-06-17 DIAGNOSIS — G20 Parkinson's disease: Secondary | ICD-10-CM | POA: Diagnosis not present

## 2016-06-17 DIAGNOSIS — R5381 Other malaise: Secondary | ICD-10-CM | POA: Diagnosis not present

## 2016-06-17 DIAGNOSIS — G8929 Other chronic pain: Secondary | ICD-10-CM | POA: Diagnosis not present

## 2016-06-17 NOTE — Progress Notes (Signed)
Patient ID: [name], female   DOB: 24-Feb-1941, 75 y.o.   MRN: [id number]   Location:   [facility] Room Number: 162-B Place of Service:  [name] (31)   CODE STATUS: Full Code until otherwise determined  Allergies  Allergen Reactions  . Codeine Other (See Comments)    Unknown allergic reaction  . Penicillins Other (See Comments)    Has patient had a PCN reaction causing immediate rash, facial/tongue/throat swelling, SOB or lightheadedness with hypotension: No Has patient had a PCN reaction causing severe rash involving mucus membranes or skin necrosis: No Has patient had a PCN reaction that required hospitalization No Has patient had a PCN reaction occurring within the last 10 years: No If all of the above answers are "NO", then may proceed with Cephalosporin use. Pt passed out in doctor's office after penicillin dose    Chief Complaint  Patient presents with  . Hospitalization Follow-up    Hospital Follow up    HPI:  She has been hospitalized for a syncopal episode without clear etiology. Her EF has dropped to 40-45%; she did have an exacerbation of her chf. She is here for short term rehab with her goal to return back home. She tells me that she is feeling fatigued; and that her voice is weak. There are no nursing concerns at this time.    Past Medical History:  Diagnosis Date  . Acute on chronic systolic heart failure ([place])   . Anemia   . Anxiety   . Arthritis   . Cataract left  . Cellulitis and abscess of leg, except foot   . Cellulitis of lower leg 12/24/2011  . CHF (congestive heart failure) ([place])   . Chronic bronchitis ([place])    "usually get it q yr"  . Chronic systolic heart failure ([place])   . GERD (gastroesophageal reflux disease)   . Glaucoma left  . Helicobacter pylori (H. pylori)   . History of bleeding peptic ulcer   . History of blood transfusion    "don't remember why"  . Hypercholesterolemia   . Hypertension   . Hypotension, unspecified     . Hypothyroidism   . Parkinson's disease   . Pneumonia   . Sleep apnea    "had test years ago; insurance wouldn't pay for mask so I never had one" (04/27/2015)  . Stroke [facility]) X 3   "that  was the reason I got Parkinson's"    Past Surgical History:  Procedure Laterality Date  . BACK SURGERY    . ESOPHAGOGASTRODUODENOSCOPY  09/26/2011   Procedure: ESOPHAGOGASTRODUODENOSCOPY (EGD);  Surgeon: Zenovia Jarred, MD;  Location: [facility] ENDOSCOPY;  Service: Gastroenterology;  Laterality: N/A;  To be done at bedside.  . ESOPHAGOGASTRODUODENOSCOPY N/A 09/30/2013   Procedure: ESOPHAGOGASTRODUODENOSCOPY (EGD);  Surgeon: [name], MD;  Location: [place];  Service: Endoscopy;  Laterality: N/A;  . ESOPHAGOGASTRODUODENOSCOPY N/A 11/25/2013   Procedure: ESOPHAGOGASTRODUODENOSCOPY (EGD);  Surgeon: [name], MD;  Location: [facility] ENDOSCOPY;  Service: Endoscopy;  Laterality: N/A;  . HEMIARTHROPLASTY HIP Right 09/24/2007   Archie Endo 01/23/2011  . KYPHOPLASTY  06/2009   T12/notes 07/17/2009  . NISSEN FUNDOPLICATION     "had OR for GERD"  . TENDON REPAIR     Gluteus medius Archie Endo 01/23/2011  . THYROIDECTOMY    . TONSILLECTOMY      Social History   Social History  . Marital status: Single    Spouse name: N/A  . Number of children: 0  . Years of  education: 12   Occupational History  .      Retired   Social History Main Topics  . Smoking status: Never Smoker  . Smokeless tobacco: Never Used  . Alcohol use No  . Drug use: No  . Sexual activity: No   Other Topics Concern  . Not on file   Social History Narrative   Patient lives at home alone. Patient is retired. Patient has high school education.   Caffeine- one daily.   Right handed.   Family History  Problem Relation Age of Onset  . GER disease Mother   . Heart disease Father       VITAL SIGNS BP 130/80   Pulse 93   Temp 97.9 F (36.6 C) (Oral)   Resp 18   Ht 5\' 4"  (1.626 m)   Wt 160 lb (72.6 kg)   SpO2 95%   BMI 27.46 kg/m    Patient's Medications  New Prescriptions   No medications on file  Previous Medications   ASPIRIN EC 81 MG TABLET    Take 81 mg by mouth daily.   CARBIDOPA-LEVODOPA (SINEMET IR) 25-100 MG TABLET    Take 1 at 6a, 1 1/2 pills at 9a, 1 at noon, 1 1/2 pills at 3p, 1 at 6pm, 1 1/2 pills at 9pm   CHOLECALCIFEROL (VITAMIN D) 2000 UNITS CAPS    Take 2,000 Units by mouth daily. Reported on 04/10/2016   DOCUSATE SODIUM (COLACE) 100 MG CAPSULE    Take 100 mg by mouth daily. Reported on 02/28/2016   FERROUS SULFATE 325 (65 FE) MG EC TABLET    Take 1 tablet (325 mg total) by mouth daily.   FUROSEMIDE (LASIX) 40 MG TABLET    Take 1 tablet (40 mg total) by mouth daily.   LATANOPROST (XALATAN) 0.005 % OPHTHALMIC SOLUTION    Place 1 drop into the left eye daily at 6 PM.    LORAZEPAM (ATIVAN) 0.5 MG TABLET    Take 1 tablet (0.5 mg total) by mouth 2 (two) times daily as needed for anxiety.   METOPROLOL TARTRATE (LOPRESSOR) 25 MG TABLET    Take 1 tablet (25 mg total) by mouth 2 (two) times daily.   MULTIPLE VITAMINS-MINERALS (MULTIVITAMIN WITH MINERALS) TABLET    Take 1 tablet by mouth daily.   NYSTATIN CREAM (MYCOSTATIN)    Apply 1 application topically 2 (two) times daily.   OXYCODONE (OXY IR/ROXICODONE) 5 MG IMMEDIATE RELEASE TABLET    Take 1 tablet (5 mg total) by mouth 2 (two) times daily as needed for severe pain.   POLYETHYL GLYCOL-PROPYL GLYCOL (SYSTANE OP)    Place 1 drop into both eyes daily.    PRAMIPEXOLE (MIRAPEX) 1 MG TABLET    Take 1 tablet (1 mg total) by mouth 3 (three) times daily.   TRIAMCINOLONE CREAM (KENALOG) 0.1 %    Apply 1 application topically 2 (two) times daily.  Modified Medications   No medications on file  Discontinued Medications   No medications on file     SIGNIFICANT DIAGNOSTIC EXAMS  05-27-16: 2-d echo: - Left ventricle: The cavity size was normal. Wall thickness was increased in a pattern of mild LVH. Systolic function was mildly to moderately reduced. The estimated  ejection fraction was in the range of 40% to 45%. Diffuse hypokinesis. Features are consistent with a pseudonormal left ventricular filling pattern, with concomitant abnormal relaxation and increased filling pressure (grade 2 diastolic dysfunction). Doppler parameters are consistent with high ventricular filling pressure. -  Aortic valve: Moderately calcified annulus. Trileaflet; mildly calcified leaflets. 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 There was trivial regurgitation.    - Mitral valve: Calcified annulus. - Left atrium: The atrium was severely dilated. - Right ventricle: Systolic function was mildly reduced. - Right atrium: The atrium was mildly to moderately dilated. - Atrial septum: There was a patent foramen ovale. - Tricuspid valve: There was mild-moderate regurgitation.   06-10-16: chest x-ray: Cardiomegaly with mild pulmonary vascular congestion.  06-10-16: ct of head and cervical spine: 1. 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 2. No acute intracranial abnormality. 3. No acute fracture or listhesis identified in the cervical spine. Ligamentous injury is not excluded. Chronic cervical spine degeneration appears stable. 4. 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   06-10-16: CT angio of head: Negative CT angiography of the large and medium size vessels. Ordinary siphon atherosclerosis.  06-11-16: mri of brain: 1. No acute intracranial process identified. 2. Remote left frontal and smaller left parietal cortical infarcts. 3. Mild chronic microvascular ischemic disease.    LABS REVIEWED:   06-10-16: wbc 7.1; hgb 11.8; hct 39.3; mcv 79. 6 plt 190; glucose 125; bun 35; creat 0.96; k+ 3.9; na++ 143; liver normal albumin 3.6 blood culture X 2 no growth 06-12-16: wbc 10.0; hgb 10.3; hct 34.2; mcv 79.2; plt 183; glucose 115; bun 18; creat 0.83; k+ 3.6; na++ 141    Review of Systems  Constitutional: Positive for malaise/fatigue.  HENT:       Has weak  voice   Respiratory: Negative for cough and shortness of breath.   Cardiovascular: Negative for chest pain, palpitations and leg swelling.  Gastrointestinal: Negative for abdominal pain, constipation and heartburn.  Musculoskeletal: Negative for back pain, joint pain and myalgias.  Skin: Negative.   Neurological: Negative for dizziness.  Psychiatric/Behavioral: The patient is not nervous/anxious.     Physical Exam  Constitutional: No distress.  Eyes: Conjunctivae are normal.  Neck: Neck supple. No JVD present. No thyromegaly present.  Cardiovascular: Normal rate, regular rhythm and intact distal pulses.   Murmur heard. 2/6  Respiratory: Effort normal and breath sounds normal. No respiratory distress. She has no wheezes.  Has dyspnea on exertion   GI: Soft. Bowel sounds are normal. She exhibits no distension. There is no tenderness.  Musculoskeletal: She exhibits no edema.  Able to move all extremities  Generalized weakness present   Lymphadenopathy:    She has no cervical adenopathy.  Neurological: She is alert.  Skin: Skin is warm and dry. She is not diaphoretic.  Has pink stasis in bilateral lower extremities no open lesions present Does have some tenderness bilaterally present   Psychiatric: She has a normal mood and affect.       ASSESSMENT/ PLAN:  1. Acute on chronic systolic heart failure: EF is 40-45% (05-27-16); will continue lasix 40 mg daily lopressor 25 mg twice daily asa 81 mg daily  will monitor  2. Hypertension: will continue lopressor 25 mg twice daily asa 81 mg daily   3. Anemia:  hgb 10.3; will continue iron daily   4. Chronic constipation; will continue colace daily   5. Venous stasis dermatitis bilateral lower extremities: will continue kenalog cream twice daily   6. Chronic back pain: will continue oxycodone 5 mg twice daily as needed  7. Parkinson disease: will continue mirapex 1 mg three times daily will continue sinemet 25/100 mg tabs: 1 at 6A; 1  1/2 at 9 am; 3pm; 1 at  6 pm and 1 1/2 at 9 pm will monitor  8. Glaucoma: will continue xalatan to left eye nightly   9. Anxiety: will continue ativan 0.5 mg twice daily as needed   10. Physical deconditioning: will continue therapy as directed to improve upon gait balance strength and adl training    Time spent with patient  50  minutes >50% time spent counseling; reviewing medical record; tests; labs; and developing future plan of care   MD is aware of resident's narcotic use and is in agreement with current plan of care. We will attempt to wean resident as apropriate   Ok Edwards NP [facility] Adult Medicine  Contact [phone] Monday through Friday 8am- 5pm  After hours call [phone]

## 2016-06-18 ENCOUNTER — Non-Acute Institutional Stay (SKILLED_NURSING_FACILITY): Payer: Medicare Other | Admitting: Internal Medicine

## 2016-06-18 ENCOUNTER — Ambulatory Visit: Payer: Medicare Other | Admitting: Neurology

## 2016-06-18 ENCOUNTER — Ambulatory Visit (INDEPENDENT_AMBULATORY_CARE_PROVIDER_SITE_OTHER): Payer: Medicare Other | Admitting: Nurse Practitioner

## 2016-06-18 ENCOUNTER — Encounter: Payer: Self-pay | Admitting: Nurse Practitioner

## 2016-06-18 ENCOUNTER — Encounter: Payer: Self-pay | Admitting: Internal Medicine

## 2016-06-18 VITALS — BP 140/79 | HR 67 | Ht 64.0 in | Wt 170.0 lb

## 2016-06-18 DIAGNOSIS — G20 Parkinson's disease: Secondary | ICD-10-CM

## 2016-06-18 DIAGNOSIS — R55 Syncope and collapse: Secondary | ICD-10-CM

## 2016-06-18 DIAGNOSIS — F411 Generalized anxiety disorder: Secondary | ICD-10-CM | POA: Insufficient documentation

## 2016-06-18 DIAGNOSIS — I8311 Varicose veins of right lower extremity with inflammation: Secondary | ICD-10-CM

## 2016-06-18 DIAGNOSIS — D509 Iron deficiency anemia, unspecified: Secondary | ICD-10-CM | POA: Diagnosis not present

## 2016-06-18 DIAGNOSIS — R5381 Other malaise: Secondary | ICD-10-CM

## 2016-06-18 DIAGNOSIS — I10 Essential (primary) hypertension: Secondary | ICD-10-CM

## 2016-06-18 DIAGNOSIS — I5022 Chronic systolic (congestive) heart failure: Secondary | ICD-10-CM | POA: Diagnosis not present

## 2016-06-18 DIAGNOSIS — I8312 Varicose veins of left lower extremity with inflammation: Secondary | ICD-10-CM | POA: Diagnosis not present

## 2016-06-18 DIAGNOSIS — R269 Unspecified abnormalities of gait and mobility: Secondary | ICD-10-CM | POA: Diagnosis not present

## 2016-06-18 DIAGNOSIS — I872 Venous insufficiency (chronic) (peripheral): Secondary | ICD-10-CM

## 2016-06-18 NOTE — Patient Instructions (Signed)
Per skilled nursing sheet 

## 2016-06-18 NOTE — Progress Notes (Signed)
Patient ID: [name], female   DOB: 19-Nov-1940, 75 y.o.   MRN: [id number]    HISTORY AND PHYSICAL   DATE:  06/18/2016  Location:    [facility] Room Number: 162 B Place of Service: [name] (31)   Extended Emergency Contact Information Primary Emergency Contact: Beaulah Dinning [name])  [place] Mobile Phone: [phone] Relation: Brother Secondary Emergency Contact: Barnett Applebaum [place] Mobile Phone: [phone] Relation: Brother  Advanced Directive information Does patient have an advance directive?: Yes, Type of Advance Directive: Out of facility DNR (pink MOST or yellow form), Does patient want to make changes to advanced directive?: No - Patient declined  Chief Complaint  Patient presents with  . New Admit To [name]    HPI:  75 yo female seen today as a new admission into [name] following hospital stay for syncope, s/p fall, Parkinson's disease, chronic venous stasis, chronic systolic HF with EF 41%, LLE cellulitis, change in mental status, anxiety. She presented to the ED after fall at home. BB adjusted. Orthostatics neg. Lisinopril stopped. She was not Rx abx for LLE as it was thought to be chronic venous stasis dermatitis. Hgb 10.3; albumin 3.6; CK 44; Cr 0.83; 2D echo 45% last month; UDS neg; blood cx neg. She was recently d/c'd from hospital with acute/chronic dHF where EF dropped from 60%-->45% and she was tx for CAP. She presents to [name] for short term rehab.   She has no c/o today. She has appt to see her neurologist today. She reports AM stiffness and is taking sinemet on a regular basis. She states her roommate is not nice and would like to change rooms. No other concerns. No falls since admission. No nursing concerns. Appetite ok. Sleeps well. She is a poor historian due to impaired cognition. Hx obtained from chart  Hypertension/chronic dHF - stable. EF is 40-45% per recent 2D echo. Takes  lasix 40 mg daily and lopressor 25 mg  twice daily. Takes ASA 81 mg daily for anticoagulation  Hx Anemia - stable. takes iron daily. Hgb 10.3  Chronic constipation - stable on colace daily   Venous stasis dermatitis bilateral lower extremities- stable on kenalog cream twice daily   Chronic back pain - stable on oxycodone 5 mg twice daily as needed  Parkinson disease - takes mirapex 1 mg three times daily; sinemet 25/100 mg tabs: 1 at 6A; 1 1/2 at 9 am; 3pm; 1 at 6 pm and 1 1/2 at 9 pm. Followed by neurology  Glaucoma - stable on xalatan to left eye nightly   Anxiety - stable on ativan 0.5 mg twice daily as needed    Past Medical History:  Diagnosis Date  . Acute on chronic systolic heart failure ([place])   . Anemia   . Anxiety   . Arthritis   . Cataract left  . Cellulitis and abscess of leg, except foot   . Cellulitis of lower leg 12/24/2011  . CHF (congestive heart failure) ([place])   . Chronic bronchitis ([place])    "usually get it q yr"  . Chronic systolic heart failure ([place])   . GERD (gastroesophageal reflux disease)   . Glaucoma left  . Helicobacter pylori (H. pylori)   . History of bleeding peptic ulcer   . History of blood transfusion    "don't remember why"  . Hypercholesterolemia   . Hypertension   . Hypotension, unspecified   . Hypothyroidism   . Parkinson's disease   . Pneumonia   .  Sleep apnea    "had test years ago; insurance wouldn't pay for mask so I never had one" (04/27/2015)  . Stroke [facility]) X 3   "that  was the reason I got Parkinson's"    Past Surgical History:  Procedure Laterality Date  . BACK SURGERY    . ESOPHAGOGASTRODUODENOSCOPY  09/26/2011   Procedure: ESOPHAGOGASTRODUODENOSCOPY (EGD);  Surgeon: Zenovia Jarred, MD;  Location: [facility] ENDOSCOPY;  Service: Gastroenterology;  Laterality: N/A;  To be done at bedside.  . ESOPHAGOGASTRODUODENOSCOPY N/A 09/30/2013   Procedure: ESOPHAGOGASTRODUODENOSCOPY (EGD);  Surgeon: [name], MD;  Location: [facility];  Service: Endoscopy;  Laterality: N/A;  .  ESOPHAGOGASTRODUODENOSCOPY N/A 11/25/2013   Procedure: ESOPHAGOGASTRODUODENOSCOPY (EGD);  Surgeon: [name], MD;  Location: [facility] ENDOSCOPY;  Service: Endoscopy;  Laterality: N/A;  . HEMIARTHROPLASTY HIP Right 09/24/2007   Ar[facility] Endo 01/23/2011  . KYPHOPLASTY  06/2009   T12/notes 07/17/2009  . NISSEN FUNDOPLICATION     "had OR for GERD"  . TENDON REPAIR     Gluteus medius [facility] 01/23/2011  . THYROIDECTOMY    . TONSILLECTOMY      Patient Care Team: Gayland Curry, DO as PCP - General (Geriatric Medicine) Greg Cutter, LCSW as [place] Management (Licensed Clinical Social Worker) Knox Royalty, RN as [place], MD as Consulting Physician (Ophthalmology)  Social History   Social History  . Marital status: Single    Spouse name: N/A  . Number of children: 0  . Years of education: 12   Occupational History  .      Retired   Social History Main Topics  . Smoking status: Never Smoker  . Smokeless tobacco: Never Used  . Alcohol use No  . Drug use: No  . Sexual activity: No   Other Topics Concern  . Not on file   Social History Narrative   Patient lives at home alone. Patient is retired. Patient has high school education.   Caffeine- one daily.   Right handed.     reports that she has never smoked. She has never used smokeless tobacco. She reports that she does not drink alcohol or use drugs.  Family History  Problem Relation Age of Onset  . GER disease Mother   . Heart disease Father    Family Status  Relation Status  . Mother Deceased  . Father Deceased  . Sister Alive  . Brother Pension scheme manager History  Administered Date(s) Administered  . Influenza Whole 06/18/2012  . Influenza,inj,Quad PF,36+ Mos 09/14/2013, 07/21/2014, 07/06/2015, 06/06/2016  . PPD Test 06/12/2016  . Pneumococcal Conjugate-13 12/23/2014  . Pneumococcal Polysaccharide-23 09/24/2007    Allergies  Allergen Reactions    . Codeine Other (See Comments)    Unknown allergic reaction  . Penicillins Other (See Comments)    Has patient had a PCN reaction causing immediate rash, facial/tongue/throat swelling, SOB or lightheadedness with hypotension: No Has patient had a PCN reaction causing severe rash involving mucus membranes or skin necrosis: No Has patient had a PCN reaction that required hospitalization No Has patient had a PCN reaction occurring within the last 10 years: No If all of the above answers are "NO", then may proceed with Cephalosporin use. Pt passed out in doctor's office after penicillin dose    Medications: Patient's Medications  New Prescriptions   No medications on file  Previous Medications   ASPIRIN EC 81 MG TABLET    Take 81 mg by  mouth daily.   CARBIDOPA-LEVODOPA (SINEMET IR) 25-100 MG TABLET    Take 1 at 6a, 1 1/2 pills at 9a, 1 at noon, 1 1/2 pills at 3p, 1 at 6pm, 1 1/2 pills at 9pm   CHOLECALCIFEROL (VITAMIN D) 2000 UNITS CAPS    Take 2,000 Units by mouth daily. Reported on 04/10/2016   DOCUSATE SODIUM (COLACE) 100 MG CAPSULE    Take 100 mg by mouth daily. Reported on 02/28/2016   FERROUS SULFATE 325 (65 FE) MG EC TABLET    Take 1 tablet (325 mg total) by mouth daily.   FUROSEMIDE (LASIX) 40 MG TABLET    Take 1 tablet (40 mg total) by mouth daily.   LATANOPROST (XALATAN) 0.005 % OPHTHALMIC SOLUTION    Place 1 drop into the left eye daily at 6 PM.    LORAZEPAM (ATIVAN) 0.5 MG TABLET    Take 1 tablet (0.5 mg total) by mouth 2 (two) times daily as needed for anxiety.   METOPROLOL TARTRATE (LOPRESSOR) 25 MG TABLET    Take 1 tablet (25 mg total) by mouth 2 (two) times daily.   MULTIPLE VITAMINS-MINERALS (MULTIVITAMIN WITH MINERALS) TABLET    Take 1 tablet by mouth daily.   NYSTATIN CREAM (MYCOSTATIN)    Apply 1 application topically 2 (two) times daily.   OMEPRAZOLE (PRILOSEC) 20 MG CAPSULE    Take 20 mg by mouth daily.   OXYCODONE (OXY IR/ROXICODONE) 5 MG IMMEDIATE RELEASE TABLET    Take  1 tablet (5 mg total) by mouth 2 (two) times daily as needed for severe pain.   POLYETHYL GLYCOL-PROPYL GLYCOL (SYSTANE OP)    Place 1 drop into both eyes daily.    PRAMIPEXOLE (MIRAPEX) 1 MG TABLET    Take 1 tablet (1 mg total) by mouth 3 (three) times daily.   TRIAMCINOLONE CREAM (KENALOG) 0.1 %    Apply 1 application topically 2 (two) times daily.  Modified Medications   No medications on file  Discontinued Medications   No medications on file    Review of Systems  Unable to perform ROS: Other    Vitals:   06/18/16 0851  BP: 132/62  Pulse: (!) 58  Resp: 18  Temp: (!) 96.9 F (36.1 C)  TempSrc: Oral  SpO2: 96%  Weight: 160 lb (72.6 kg)  Height: 5' 4"  (1.626 m)   Body mass index is 27.46 kg/m.  Physical Exam  Constitutional: She appears well-developed.  Frail appearing, sitting in w/c in NAD  HENT:  Mouth/Throat: Oropharynx is clear and moist. No oropharyngeal exudate.  MMM  Eyes: Pupils are equal, round, and reactive to light. No scleral icterus.  Neck: Neck supple. Carotid bruit is not present. No tracheal deviation present. No thyromegaly present.  Cardiovascular: Normal rate, regular rhythm and intact distal pulses.  Exam reveals no gallop and no friction rub.   Murmur (1/6 SEM) heard. Trace LE edema b/l with chronic venous stasis changes  Pulmonary/Chest: Effort normal and breath sounds normal. No stridor. No respiratory distress. She has no wheezes. She has no rales.  Abdominal: Soft. Bowel sounds are normal. She exhibits no distension and no mass. There is no hepatomegaly. There is no tenderness. There is no rebound and no guarding. A hernia (nonreducible umbilcal hernia, NT) is present.  Musculoskeletal: She exhibits edema.  Lymphadenopathy:    She has no cervical adenopathy.  Neurological: She is alert. She displays tremor (resting tremor).  Skin: Skin is warm and dry. No rash noted.  Psychiatric: She has  a normal mood and affect. Her behavior is normal.      Labs reviewed: Admission on 06/10/2016, Discharged on 06/12/2016  Component Date Value Ref Range Status  . WBC 06/10/2016 7.1  4.0 - 10.5 K/uL Final  . RBC 06/10/2016 4.94  3.87 - 5.11 MIL/uL Final  . Hemoglobin 06/10/2016 11.8* 12.0 - 15.0 g/dL Final  . HCT 06/10/2016 39.3  36.0 - 46.0 % Final  . MCV 06/10/2016 79.6  78.0 - 100.0 fL Final  . MCH 06/10/2016 23.9* 26.0 - 34.0 pg Final  . MCHC 06/10/2016 30.0  30.0 - 36.0 g/dL Final  . RDW 06/10/2016 20.6* 11.5 - 15.5 % Final  . Platelets 06/10/2016 190  150 - 400 K/uL Final  . Neutrophils Relative % 06/10/2016 70  % Final  . Neutro Abs 06/10/2016 4.9  1.7 - 7.7 K/uL Final  . Lymphocytes Relative 06/10/2016 17  % Final  . Lymphs Abs 06/10/2016 1.2  0.7 - 4.0 K/uL Final  . Monocytes Relative 06/10/2016 8  % Final  . Monocytes Absolute 06/10/2016 0.6  0.1 - 1.0 K/uL Final  . Eosinophils Relative 06/10/2016 5  % Final  . Eosinophils Absolute 06/10/2016 0.3  0.0 - 0.7 K/uL Final  . Basophils Relative 06/10/2016 1  % Final  . Basophils Absolute 06/10/2016 0.1  0.0 - 0.1 K/uL Final  . Sodium 06/10/2016 143  135 - 145 mmol/L Final  . Potassium 06/10/2016 3.9  3.5 - 5.1 mmol/L Final  . Chloride 06/10/2016 108  101 - 111 mmol/L Final  . CO2 06/10/2016 24  22 - 32 mmol/L Final  . Glucose, Bld 06/10/2016 125* 65 - 99 mg/dL Final  . BUN 06/10/2016 35* 6 - 20 mg/dL Final  . Creatinine, Ser 06/10/2016 0.96  0.44 - 1.00 mg/dL Final  . Calcium 06/10/2016 9.5  8.9 - 10.3 mg/dL Final  . Total Protein 06/10/2016 7.5  6.5 - 8.1 g/dL Final  . Albumin 06/10/2016 3.6  3.5 - 5.0 g/dL Final  . AST 06/10/2016 22  15 - 41 U/L Final  . ALT 06/10/2016 13* 14 - 54 U/L Final  . Alkaline Phosphatase 06/10/2016 63  38 - 126 U/L Final  . Total Bilirubin 06/10/2016 0.5  0.3 - 1.2 mg/dL Final  . GFR calc non Af Amer 06/10/2016 56* >60 mL/min Final  . GFR calc Af Amer 06/10/2016 >60  >60 mL/min Final   Comment: (NOTE) The eGFR has been calculated using the  CKD EPI equation. This calculation has not been validated in all clinical situations. eGFR's persistently <60 mL/min signify possible Chronic Kidney Disease.   . Anion gap 06/10/2016 11  5 - 15 Final  . Lactic Acid, Venous 06/10/2016 1.28  0.5 - 1.9 mmol/L Final  . Troponin i, poc 06/10/2016 0.01  0.00 - 0.08 ng/mL Final  . Comment 3 06/10/2016          Final   Comment: Due to the release kinetics of cTnI, a negative result within the first hours of the onset of symptoms does not rule out myocardial infarction with certainty. If myocardial infarction is still suspected, repeat the test at appropriate intervals.   . Color, Urine 06/10/2016 YELLOW  YELLOW Final  . APPearance 06/10/2016 CLEAR  CLEAR Final  . Specific Gravity, Urine 06/10/2016 1.020  1.005 - 1.030 Final  . pH 06/10/2016 5.5  5.0 - 8.0 Final  . Glucose, UA 06/10/2016 NEGATIVE  NEGATIVE mg/dL Final  . Hgb urine dipstick 06/10/2016 NEGATIVE  NEGATIVE Final  .  Bilirubin Urine 06/10/2016 NEGATIVE  NEGATIVE Final  . Ketones, ur 06/10/2016 NEGATIVE  NEGATIVE mg/dL Final  . Protein, ur 06/10/2016 NEGATIVE  NEGATIVE mg/dL Final  . Nitrite 06/10/2016 NEGATIVE  NEGATIVE Final  . Leukocytes, UA 06/10/2016 NEGATIVE  NEGATIVE Final  . Lactic Acid, Venous 06/10/2016 0.76  0.5 - 1.9 mmol/L Final  . Specimen Description 06/15/2016 BLOOD LEFT HAND   Final  . Special Requests 06/15/2016 IN PEDIATRIC BOTTLE 2CC   Final  . Culture 06/15/2016 NO GROWTH 5 DAYS   Final  . Report Status 06/15/2016 06/15/2016 FINAL   Final  . Specimen Description 06/15/2016 BLOOD RIGHT ANTECUBITAL   Final  . Special Requests 06/15/2016 BOTTLES DRAWN AEROBIC ONLY 5CC   Final  . Culture 06/15/2016 NO GROWTH 5 DAYS   Final  . Report Status 06/15/2016 06/15/2016 FINAL   Final  . Opiates 06/10/2016 NONE DETECTED  NONE DETECTED Final  . Cocaine 06/10/2016 NONE DETECTED  NONE DETECTED Final  . Benzodiazepines 06/10/2016 NONE DETECTED  NONE DETECTED Final  .  Amphetamines 06/10/2016 NONE DETECTED  NONE DETECTED Final  . Tetrahydrocannabinol 06/10/2016 NONE DETECTED  NONE DETECTED Final  . Barbiturates 06/10/2016 NONE DETECTED  NONE DETECTED Final   Comment:        DRUG SCREEN FOR MEDICAL PURPOSES ONLY.  IF CONFIRMATION IS NEEDED FOR ANY PURPOSE, NOTIFY LAB WITHIN 5 DAYS.        LOWEST DETECTABLE LIMITS FOR URINE DRUG SCREEN Drug Class       Cutoff (ng/mL) Amphetamine      1000 Barbiturate      200 Benzodiazepine   785 Tricyclics       885 Opiates          300 Cocaine          300 THC              50   . Glucose-Capillary 06/10/2016 93  65 - 99 mg/dL Final  . Sodium 06/11/2016 143  135 - 145 mmol/L Final  . Potassium 06/11/2016 4.0  3.5 - 5.1 mmol/L Final  . Chloride 06/11/2016 107  101 - 111 mmol/L Final  . CO2 06/11/2016 29  22 - 32 mmol/L Final  . Glucose, Bld 06/11/2016 103* 65 - 99 mg/dL Final  . BUN 06/11/2016 20  6 - 20 mg/dL Final  . Creatinine, Ser 06/11/2016 0.84  0.44 - 1.00 mg/dL Final  . Calcium 06/11/2016 8.7* 8.9 - 10.3 mg/dL Final  . GFR calc non Af Amer 06/11/2016 >60  >60 mL/min Final  . GFR calc Af Amer 06/11/2016 >60  >60 mL/min Final   Comment: (NOTE) The eGFR has been calculated using the CKD EPI equation. This calculation has not been validated in all clinical situations. eGFR's persistently <60 mL/min signify possible Chronic Kidney Disease.   . Anion gap 06/11/2016 7  5 - 15 Final  . WBC 06/11/2016 7.7  4.0 - 10.5 K/uL Final  . RBC 06/11/2016 4.16  3.87 - 5.11 MIL/uL Final  . Hemoglobin 06/11/2016 10.0* 12.0 - 15.0 g/dL Final  . HCT 06/11/2016 33.0* 36.0 - 46.0 % Final  . MCV 06/11/2016 79.3  78.0 - 100.0 fL Final  . MCH 06/11/2016 24.0* 26.0 - 34.0 pg Final  . MCHC 06/11/2016 30.3  30.0 - 36.0 g/dL Final  . RDW 06/11/2016 20.5* 11.5 - 15.5 % Final  . Platelets 06/11/2016 192  150 - 400 K/uL Final  . Glucose-Capillary 06/11/2016 96  65 - 99 mg/dL Final  .  Total CK 06/11/2016 44  38 - 234 U/L Final    . Sodium 06/12/2016 141  135 - 145 mmol/L Final  . Potassium 06/12/2016 3.6  3.5 - 5.1 mmol/L Final  . Chloride 06/12/2016 108  101 - 111 mmol/L Final  . CO2 06/12/2016 27  22 - 32 mmol/L Final  . Glucose, Bld 06/12/2016 115* 65 - 99 mg/dL Final  . BUN 06/12/2016 18  6 - 20 mg/dL Final  . Creatinine, Ser 06/12/2016 0.83  0.44 - 1.00 mg/dL Final  . Calcium 06/12/2016 8.8* 8.9 - 10.3 mg/dL Final  . GFR calc non Af Amer 06/12/2016 >60  >60 mL/min Final  . GFR calc Af Amer 06/12/2016 >60  >60 mL/min Final   Comment: (NOTE) The eGFR has been calculated using the CKD EPI equation. This calculation has not been validated in all clinical situations. eGFR's persistently <60 mL/min signify possible Chronic Kidney Disease.   . Anion gap 06/12/2016 6  5 - 15 Final  . WBC 06/12/2016 10.0  4.0 - 10.5 K/uL Final  . RBC 06/12/2016 4.32  3.87 - 5.11 MIL/uL Final  . Hemoglobin 06/12/2016 10.3* 12.0 - 15.0 g/dL Final  . HCT 06/12/2016 34.2* 36.0 - 46.0 % Final  . MCV 06/12/2016 79.2  78.0 - 100.0 fL Final  . MCH 06/12/2016 23.8* 26.0 - 34.0 pg Final  . MCHC 06/12/2016 30.1  30.0 - 36.0 g/dL Final  . RDW 06/12/2016 20.3* 11.5 - 15.5 % Final  . Platelets 06/12/2016 183  150 - 400 K/uL Final  . Glucose-Capillary 06/12/2016 197* 65 - 99 mg/dL Final  Office Visit on 06/06/2016  Component Date Value Ref Range Status  . WBC 06/06/2016 11.6* 3.8 - 10.8 K/uL Final  . RBC 06/06/2016 4.38  3.80 - 5.10 MIL/uL Final  . Hemoglobin 06/06/2016 10.5* 11.7 - 15.5 g/dL Final  . HCT 06/06/2016 32.2* 35.0 - 45.0 % Final  . MCV 06/06/2016 73.5* 80.0 - 100.0 fL Final  . MCH 06/06/2016 24.0* 27.0 - 33.0 pg Final  . MCHC 06/06/2016 32.6  32.0 - 36.0 g/dL Final  . RDW 06/06/2016 19.3* 11.0 - 15.0 % Final  . Platelets 06/06/2016 167  140 - 400 K/uL Final  . Neutro Abs 06/06/2016 9628* 1,500 - 7,800 cells/uL Final  . Lymphs Abs 06/06/2016 928  850 - 3,900 cells/uL Final  . Monocytes Absolute 06/06/2016 696  200 - 950  cells/uL Final  . Eosinophils Absolute 06/06/2016 348  15 - 500 cells/uL Final  . Basophils Absolute 06/06/2016 0  0 - 200 cells/uL Final  . Neutrophils Relative % 06/06/2016 83  % Final  . Lymphocytes Relative 06/06/2016 8  % Final  . Monocytes Relative 06/06/2016 6  % Final  . Eosinophils Relative 06/06/2016 3  % Final  . Basophils Relative 06/06/2016 0  % Final  . Smear Review 06/06/2016 Criteria for review not met   Final  . Sodium 06/06/2016 135  135 - 146 mmol/L Final  . Potassium 06/06/2016 3.9  3.5 - 5.3 mmol/L Final  . Chloride 06/06/2016 97* 98 - 110 mmol/L Final  . CO2 06/06/2016 26  20 - 31 mmol/L Final  . Glucose, Bld 06/06/2016 116* 65 - 99 mg/dL Final  . BUN 06/06/2016 95* 7 - 25 mg/dL Final  . Creat 06/06/2016 1.68* 0.60 - 0.93 mg/dL Final   Comment:   For patients > or = 76 years of age: The upper reference limit for Creatinine is approximately 13% higher for people identified  as African-American.     . Total Bilirubin 06/06/2016 0.5  0.2 - 1.2 mg/dL Final  . Alkaline Phosphatase 06/06/2016 66  33 - 130 U/L Final  . AST 06/06/2016 16  10 - 35 U/L Final  . ALT 06/06/2016 7  6 - 29 U/L Final  . Total Protein 06/06/2016 6.4  6.1 - 8.1 g/dL Final  . Albumin 06/06/2016 3.7  3.6 - 5.1 g/dL Final  . Calcium 06/06/2016 8.8  8.6 - 10.4 mg/dL Final  Admission on 05/25/2016, Discharged on 06/01/2016  Component Date Value Ref Range Status  . Sodium 05/25/2016 139  135 - 145 mmol/L Final  . Potassium 05/25/2016 4.0  3.5 - 5.1 mmol/L Final  . Chloride 05/25/2016 107  101 - 111 mmol/L Final  . CO2 05/25/2016 25  22 - 32 mmol/L Final  . Glucose, Bld 05/25/2016 93  65 - 99 mg/dL Final  . BUN 05/25/2016 16  6 - 20 mg/dL Final  . Creatinine, Ser 05/25/2016 0.76  0.44 - 1.00 mg/dL Final  . Calcium 05/25/2016 9.1  8.9 - 10.3 mg/dL Final  . GFR calc non Af Amer 05/25/2016 >60  >60 mL/min Final  . GFR calc Af Amer 05/25/2016 >60  >60 mL/min Final   Comment: (NOTE) The eGFR has  been calculated using the CKD EPI equation. This calculation has not been validated in all clinical situations. eGFR's persistently <60 mL/min signify possible Chronic Kidney Disease.   . Anion gap 05/25/2016 7  5 - 15 Final  . WBC 05/25/2016 7.4  4.0 - 10.5 K/uL Final  . RBC 05/25/2016 2.80* 3.87 - 5.11 MIL/uL Final  . Hemoglobin 05/25/2016 9.1* 12.0 - 15.0 g/dL Final  . HCT 05/25/2016 30.0* 36.0 - 46.0 % Final  . MCV 05/25/2016 107.1* 78.0 - 100.0 fL Final  . MCH 05/25/2016 32.5  26.0 - 34.0 pg Final  . MCHC 05/25/2016 30.3  30.0 - 36.0 g/dL Final  . RDW 05/25/2016 16.8* 11.5 - 15.5 % Final  . Platelets 05/25/2016 182  150 - 400 K/uL Final  . Troponin i, poc 05/25/2016 0.00  0.00 - 0.08 ng/mL Final  . Comment 3 05/25/2016          Final   Comment: Due to the release kinetics of cTnI, a negative result within the first hours of the onset of symptoms does not rule out myocardial infarction with certainty. If myocardial infarction is still suspected, repeat the test at appropriate intervals.   . B Natriuretic Peptide 05/25/2016 294.0* 0.0 - 100.0 pg/mL Final  . Troponin I 05/25/2016 <0.03  <0.03 ng/mL Final  . Troponin I 05/25/2016 <0.03  <0.03 ng/mL Final  . Troponin I 05/25/2016 <0.03  <0.03 ng/mL Final  . Weight 05/27/2016 2652.57  oz Final  . Height 05/27/2016 64  in Final  . BP 05/27/2016 146/53  mmHg Final  . TSH 05/25/2016 0.762  0.350 - 4.500 uIU/mL Final  . Sodium 05/26/2016 141  135 - 145 mmol/L Final  . Potassium 05/26/2016 4.0  3.5 - 5.1 mmol/L Final  . Chloride 05/26/2016 104  101 - 111 mmol/L Final  . CO2 05/26/2016 31  22 - 32 mmol/L Final  . Glucose, Bld 05/26/2016 90  65 - 99 mg/dL Final  . BUN 05/26/2016 16  6 - 20 mg/dL Final  . Creatinine, Ser 05/26/2016 0.90  0.44 - 1.00 mg/dL Final  . Calcium 05/26/2016 8.8* 8.9 - 10.3 mg/dL Final  . GFR calc non Af Amer 05/26/2016 >60  >  60 mL/min Final  . GFR calc Af Amer 05/26/2016 >60  >60 mL/min Final   Comment:  (NOTE) The eGFR has been calculated using the CKD EPI equation. This calculation has not been validated in all clinical situations. eGFR's persistently <60 mL/min signify possible Chronic Kidney Disease.   . Anion gap 05/26/2016 6  5 - 15 Final  . WBC 05/26/2016 7.4  4.0 - 10.5 K/uL Final  . RBC 05/26/2016 4.45  3.87 - 5.11 MIL/uL Final  . Hemoglobin 05/26/2016 10.3* 12.0 - 15.0 g/dL Final  . HCT 05/26/2016 34.3* 36.0 - 46.0 % Final  . MCV 05/26/2016 77.1* 78.0 - 100.0 fL Final   Comment: DELTA CHECK NOTED REPEATED TO VERIFY   . MCH 05/26/2016 23.1* 26.0 - 34.0 pg Final  . MCHC 05/26/2016 30.0  30.0 - 36.0 g/dL Final  . RDW 05/26/2016 17.6* 11.5 - 15.5 % Final  . Platelets 05/26/2016 150  150 - 400 K/uL Final  . Sodium 05/27/2016 139  135 - 145 mmol/L Final  . Potassium 05/27/2016 4.1  3.5 - 5.1 mmol/L Final  . Chloride 05/27/2016 102  101 - 111 mmol/L Final  . CO2 05/27/2016 32  22 - 32 mmol/L Final  . Glucose, Bld 05/27/2016 93  65 - 99 mg/dL Final  . BUN 05/27/2016 23* 6 - 20 mg/dL Final  . Creatinine, Ser 05/27/2016 1.00  0.44 - 1.00 mg/dL Final  . Calcium 05/27/2016 9.1  8.9 - 10.3 mg/dL Final  . GFR calc non Af Amer 05/27/2016 54* >60 mL/min Final  . GFR calc Af Amer 05/27/2016 >60  >60 mL/min Final   Comment: (NOTE) The eGFR has been calculated using the CKD EPI equation. This calculation has not been validated in all clinical situations. eGFR's persistently <60 mL/min signify possible Chronic Kidney Disease.   . Anion gap 05/27/2016 5  5 - 15 Final  . Magnesium 05/27/2016 2.2  1.7 - 2.4 mg/dL Final  . Sodium 05/29/2016 137  135 - 145 mmol/L Final  . Potassium 05/29/2016 3.6  3.5 - 5.1 mmol/L Final  . Chloride 05/29/2016 100* 101 - 111 mmol/L Final  . CO2 05/29/2016 25  22 - 32 mmol/L Final  . Glucose, Bld 05/29/2016 158* 65 - 99 mg/dL Final  . BUN 05/29/2016 28* 6 - 20 mg/dL Final  . Creatinine, Ser 05/29/2016 1.06* 0.44 - 1.00 mg/dL Final  . Calcium 05/29/2016  8.8* 8.9 - 10.3 mg/dL Final  . GFR calc non Af Amer 05/29/2016 50* >60 mL/min Final  . GFR calc Af Amer 05/29/2016 58* >60 mL/min Final   Comment: (NOTE) The eGFR has been calculated using the CKD EPI equation. This calculation has not been validated in all clinical situations. eGFR's persistently <60 mL/min signify possible Chronic Kidney Disease.   . Anion gap 05/29/2016 12  5 - 15 Final  . Magnesium 05/29/2016 2.5* 1.7 - 2.4 mg/dL Final  . Sodium 05/31/2016 138  135 - 145 mmol/L Final  . Potassium 05/31/2016 4.1  3.5 - 5.1 mmol/L Final  . Chloride 05/31/2016 97* 101 - 111 mmol/L Final  . CO2 05/31/2016 29  22 - 32 mmol/L Final  . Glucose, Bld 05/31/2016 131* 65 - 99 mg/dL Final  . BUN 05/31/2016 38* 6 - 20 mg/dL Final  . Creatinine, Ser 05/31/2016 1.15* 0.44 - 1.00 mg/dL Final  . Calcium 05/31/2016 9.3  8.9 - 10.3 mg/dL Final  . GFR calc non Af Amer 05/31/2016 45* >60 mL/min Final  . GFR calc  Af Amer 05/31/2016 53* >60 mL/min Final   Comment: (NOTE) The eGFR has been calculated using the CKD EPI equation. This calculation has not been validated in all clinical situations. eGFR's persistently <60 mL/min signify possible Chronic Kidney Disease.   . Anion gap 05/31/2016 12  5 - 15 Final  . Magnesium 05/31/2016 2.6* 1.7 - 2.4 mg/dL Final    Ct Angio Head W Or Wo Contrast  Result Date: 06/10/2016 CLINICAL DATA:  Altered mental status.  Found on the floor. EXAM: CT ANGIOGRAPHY HEAD TECHNIQUE: Multidetector CT imaging of the head was performed using the standard protocol during bolus administration of intravenous contrast. Multiplanar CT image reconstructions and MIPs were obtained to evaluate the vascular anatomy. CONTRAST:  50 cc Isovue 370 COMPARISON:  None. FINDINGS: CTA HEAD Anterior circulation: Internal carotid arteries are widely patent through the skullbase. Ordinary atherosclerotic calcification in the siphon regions but without stenosis greater than 30%. The anterior and  middle cerebral vessels are patent without proximal stenosis, aneurysm or vascular malformation. Posterior circulation: Both vertebral arteries are patent through the foramen magnum, with the left being dominant. No basilar stenosis. Posterior circulation branch vessels are normal. Venous sinuses: Patent and normal Anatomic variants: None significant Delayed phase:No abnormal enhancement IMPRESSION: Negative CT angiography of the large and medium size vessels. Ordinary siphon atherosclerosis. Electronically Signed   By: Nelson Chimes M.D.   On: 06/10/2016 14:50   Dg Chest 2 View  Result Date: 06/10/2016 CLINICAL DATA:  Status post fall.  Right hip pain. EXAM: CHEST  2 VIEW COMPARISON:  06/01/2016 FINDINGS: There is mild bilateral interstitial thickening. There is no focal parenchymal opacity. There is no pleural effusion or pneumothorax. There is stable cardiomegaly. The osseous structures are unremarkable. IMPRESSION: Cardiomegaly with mild pulmonary vascular congestion. Electronically Signed   By: Kathreen Devoid   On: 06/10/2016 10:43   Dg Chest 2 View  Result Date: 05/31/2016 CLINICAL DATA:  Sob for 5 days EXAM: CHEST  2 VIEW COMPARISON:  05/29/2016 FINDINGS: Lateral view degraded by patient arm position. Thoracolumbar vertebral augmentation. Patient rotated right on the frontal radiograph. Midline trachea. Moderate cardiomegaly. Moderate to large hiatal hernia. No pleural effusion or pneumothorax. Chronic interstitial thickening. Left base scarring. Vague increased density projecting over the right upper lobe on the frontal radiograph could be due to EKG lead artifact. IMPRESSION: Artifact versus right upper lobe airspace disease. Consider repeat radiograph after removal of EKG leads. Cardiomegaly with left base scarring.  No congestive failure. Hiatal hernia. Electronically Signed   By: Abigail Miyamoto M.D.   On: 05/31/2016 09:24   Dg Chest 2 View  Result Date: 05/25/2016 CLINICAL DATA:  Central chest pain  since this morning EXAM: CHEST  2 VIEW COMPARISON:  August 30, 2015 FINDINGS: The heart size is enlarged. Mediastinal contour is normal. There is increased pulmonary interstitium bilaterally. Patchy opacity is identified in the left lung base. There is no pleural effusion. There is a hiatal hernia. The bony structures are stable. IMPRESSION: Cardiomegaly.  Interstitial edema. Patchy consolidation of left lung base pneumonia is not excluded. Electronically Signed   By: Abelardo Diesel M.D.   On: 05/25/2016 07:35   Ct Head Wo Contrast  Result Date: 06/10/2016 CLINICAL DATA:  75 year old female status post fall from recliner this morning. Possible syncope. Initial encounter. EXAM: CT HEAD WITHOUT CONTRAST CT CERVICAL SPINE WITHOUT CONTRAST TECHNIQUE: Multidetector CT imaging of the head and cervical spine was performed following the standard protocol without intravenous contrast. Multiplanar CT image reconstructions  of the cervical spine were also generated. COMPARISON:  Head and cervical spine CT 07/30/2013. FINDINGS: CT HEAD FINDINGS Brain: Confluent hypodensity in the left frontal operculum resembling a chronic cortically based infarct is new since 2014. No associated mass effect. Mild associated ex vacuo enlargement of the left lateral ventricle. No ventriculomegaly. Elsewhere gray-white matter differentiation is within normal limits. No midline shift, mass effect, or evidence of intracranial mass lesion. No acute intracranial hemorrhage identified. Vascular: Calcified atherosclerosis at the skull base. Skull: Stable and intact. Sinuses/Orbits: Visualized paranasal sinuses and mastoids are stable and well pneumatized. Other: No acute orbit or scalp soft tissue findings identified. Multiple calcified benign-appearing scalp nodules re- demonstrated. CT CERVICAL SPINE FINDINGS Alignment: Stable cervical vertebral height and alignment with some straightening of lordosis and mild spondylolisthesis. Cervicothoracic  junction alignment is within normal limits. Bilateral posterior element alignment is within normal limits. Skull base and vertebrae: Visualized skull base is intact. No atlanto-occipital dissociation. No acute cervical spine fracture identified. Grossly stable and intact visualized upper thoracic levels. Soft tissues and spinal canal: Chronic left thyroid goiter with multiple nodules. Stable mild mass effect on the trachea at the thoracic inlet with no airway narrowing. Surgical clips from previous right thyroidectomy. Disc levels: Chronic mild degenerative spinal stenosis suspected at C5-C6. Upper chest: Stable mild apical scarring. Other: None. IMPRESSION: 1. 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 2. No acute intracranial abnormality. 3. No acute fracture or listhesis identified in the cervical spine. Ligamentous injury is not excluded. Chronic cervical spine degeneration appears stable. 4. 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 Electronically Signed   By: Genevie Ann M.D.   On: 06/10/2016 11:13   Ct Cervical Spine Wo Contrast  Result Date: 06/10/2016 CLINICAL DATA:  75 year old female status post fall from recliner this morning. Possible syncope. Initial encounter. EXAM: CT HEAD WITHOUT CONTRAST CT CERVICAL SPINE WITHOUT CONTRAST TECHNIQUE: Multidetector CT imaging of the head and cervical spine was performed following the standard protocol without intravenous contrast. Multiplanar CT image reconstructions of the cervical spine were also generated. COMPARISON:  Head and cervical spine CT 07/30/2013. FINDINGS: CT HEAD FINDINGS Brain: Confluent hypodensity in the left frontal operculum resembling a chronic cortically based infarct is new since 2014. No associated mass effect. Mild associated ex vacuo enlargement of the left lateral ventricle. No ventriculomegaly. Elsewhere gray-white matter differentiation is within normal limits. No midline shift, mass effect, or evidence  of intracranial mass lesion. No acute intracranial hemorrhage identified. Vascular: Calcified atherosclerosis at the skull base. Skull: Stable and intact. Sinuses/Orbits: Visualized paranasal sinuses and mastoids are stable and well pneumatized. Other: No acute orbit or scalp soft tissue findings identified. Multiple calcified benign-appearing scalp nodules re- demonstrated. CT CERVICAL SPINE FINDINGS Alignment: Stable cervical vertebral height and alignment with some straightening of lordosis and mild spondylolisthesis. Cervicothoracic junction alignment is within normal limits. Bilateral posterior element alignment is within normal limits. Skull base and vertebrae: Visualized skull base is intact. No atlanto-occipital dissociation. No acute cervical spine fracture identified. Grossly stable and intact visualized upper thoracic levels. Soft tissues and spinal canal: Chronic left thyroid goiter with multiple nodules. Stable mild mass effect on the trachea at the thoracic inlet with no airway narrowing. Surgical clips from previous right thyroidectomy. Disc levels: Chronic mild degenerative spinal stenosis suspected at C5-C6. Upper chest: Stable mild apical scarring. Other: None. IMPRESSION: 1. 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 2. No acute intracranial abnormality. 3. No acute fracture  or listhesis identified in the cervical spine. Ligamentous injury is not excluded. Chronic cervical spine degeneration appears stable. 4. 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 Electronically Signed   By: Genevie Ann M.D.   On: 06/10/2016 11:13   Mr Jeri Cos GB Contrast  Result Date: 06/11/2016 CLINICAL DATA:  Initial evaluation for syncope. EXAM: MRI HEAD WITHOUT AND WITH CONTRAST TECHNIQUE: Multiplanar, multiecho pulse sequences of the brain and surrounding structures were obtained without and with intravenous contrast. CONTRAST:  84m MULTIHANCE GADOBENATE DIMEGLUMINE 529 MG/ML IV SOLN  COMPARISON:  Prior CTs from 06/10/2016. FINDINGS: Brain: Study degraded by motion artifact. Mild diffuse prominence of the CSF containing spaces is compatible with generalized age-related cerebral atrophy. Mild patchy T2/FLAIR hyperintensity within the periventricular deep white matter both cerebral hemispheres present, nonspecific, but most likely related to mild chronic microvascular ischemic changes. Focal encephalomalacia with gliosis involving the cortical gray matter of the anterior left frontal lobe compatible with remote infarct. Associated small amount of chronic hemosiderin staining present within this region. Probable additional small remote cortical infarct within the left parietal lobe (series 5, image 18). No abnormal foci of restricted diffusion to suggest acute or subacute ischemia. Gray-white matter differentiation maintained. No evidence for acute intracranial hemorrhage. No mass lesion, midline shift, or mass effect. No hydrocephalus. No extra-axial fluid collection. Major dural sinuses are grossly patent. No abnormal enhancement. 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 This is of doubtful clinical significance. Pituitary infundibulum midline. Vascular: Major intracranial vascular flow voids well preserved. Skull and upper cervical spine: Craniocervical junction normal. Upper cervical spine grossly unremarkable without significant stenosis or other acute abnormality. Bone marrow signal intensity within normal limits. Multiple soft tissue nodules within the scalp noted, likely sebaceous cyst. Sinuses/Orbits: Globes and orbits within normal limits. Scattered mucosal thickening within the paranasal sinuses. No air-fluid level to suggest active sinus infection. No mastoid effusion. Inner ear structures grossly normal. Other: No other significant finding. IMPRESSION: 1. No acute  intracranial process identified. 2. Remote left frontal and smaller left parietal cortical infarcts. 3. Mild chronic microvascular ischemic disease. Electronically Signed   By: BJeannine BogaM.D.   On: 06/11/2016 04:18   Dg Chest Port 1 View  Result Date: 06/01/2016 CLINICAL DATA:  Congestive heart failure exacerbation. EXAM: PORTABLE CHEST 1 VIEW COMPARISON:  Radiographs of May 31, 2016. FINDINGS: Stable cardiomediastinal silhouette. No pneumothorax or pleural effusion is noted. Right lung is clear. Stable left basilar subsegmental atelectasis or scarring is noted. Bony thorax is unremarkable. IMPRESSION: Stable left basilar subsegmental atelectasis or scarring. Electronically Signed   By: JMarijo Conception M.D.   On: 06/01/2016 09:52   Dg Chest Port 1 View  Result Date: 05/29/2016 CLINICAL DATA:  Shortness of breath for 1 day EXAM: PORTABLE CHEST 1 VIEW COMPARISON:  May 25, 2016 FINDINGS: The degree of inspiration is shallow. There is atelectatic change in the left base. The lungs elsewhere clear. Heart is mildly prominent with pulmonary vascularity within normal limits. No adenopathy. There is degenerative change in each shoulder. No pneumothorax but IMPRESSION: No base atelectasis. Lungs elsewhere clear. Stable cardiac silhouette. Note shallow degree of inspiration. Electronically Signed   By: WLowella GripIII M.D.   On: 05/29/2016 08:26   Dg Hip Unilat W Or Wo Pelvis 2-3 Views Right  Result Date: 06/10/2016 CLINICAL DATA:  Fall today.  Right hip pain. EXAM: DG HIP (WITH  OR WITHOUT PELVIS) 2-3V RIGHT COMPARISON:  None. FINDINGS: A right hip hemiarthroplasty is noted. The hip is located. The femoral component is well seated. There is no associated fracture. The pelvis is otherwise intact. Moderate osteopenia is noted. Degenerative changes are present in the lower lumbar spine. IMPRESSION: 1. Right hip hemiarthroplasty without complication. 2. No acute fracture. 3. Moderate  osteopenia. Electronically Signed   By: San Morelle M.D.   On: 06/10/2016 10:43     Assessment/Plan   ICD-9-CM ICD-10-CM   1. Physical deconditioning 799.3 R53.81   2. Venous stasis dermatitis of both lower extremities 454.1 I83.11     I83.12   3. Parkinson's disease ([place]) 332.0 G20   4. Anemia, iron deficiency 280.9 D50.9   5. Essential hypertension 401.9 I10   6. Chronic systolic congestive heart failure (HCC) 428.22 I50.22    428.0    7. Anxiety state 300.00 F41.1    Cont current meds as ordered  F/u with neurology as scheduled  PT/OT/ST as ordered  Fall precautions  GOAL: short term rehab and d/c home when medically appropriate. Communicated with pt and nursing.  Will follow  Reta Norgren S. Perlie Gold  [facility] [address] [place], [place] [address] [phone] Cell (Monday-Friday 8 AM - 5 PM) [phone] After 5 PM and follow prompts

## 2016-06-18 NOTE — Progress Notes (Signed)
[facility]  PATIENT: [name] DOB: 11-06-1940   REASON FOR VISIT: Follow-up for Parkinson's disease HISTORY FROM: Patient alone at visit    HISTORY OF PRESENT ILLNESS: HISTORY [name]. [name] is a 75 year old right-handed woman with an underlying complex medical history of hypertension, arthritis, anemia, cellulitis, chronic systolic heart failure, peptic ulcer disease, and chronic low back pain with history of T12 compression fracture, status post kyphoplasty in October 2010, who presents for follow-up consultation of her Parkinson's disease, complicated by motor complications, motor fluctuations and recurrent falls, deconditioning, recent cellulitis.The patient is unaccompanied today. [name] her live-in caretaker moved to [name] to be with her parents, [name] says. I last saw her on 10/10/2015, at which time the patient reported feeling stable. [name] reported no recent falls. She did report of recent fall about 3 weeks prior in the kitchen, indicating that she let go of her walker and fell backwards. She did not hurt her self, she had ongoing issues with severe dyskinesias and some visual hallucinations. She had lower extremity swelling. She had been using oxycodone as needed for low back pain. I've reduced her Mirapex because of lower extremity swelling. She was advised to continue with Sinemet, one pills 6 times a day.    05/17/2017SA: She reports that the increase in C/L helped with the freezing. She takes 1 pill, alternating with 1 1/2 pills, every 3 hours, starting at 6 AM and ending at 9 PM. She did fall recently as she was getting out of her new recliner and thankfully, [name] was there, her nephew's [name]) wife. She checks on patient morning and evening. Her brother ([name]'s dad) lives in [place], her other brother (in [place]) brings her supper and for lunch she has meals on wheels. She was seen in the interim by her primary care physician on 01/26/2016 and was noted  to have right lower extremity cellulitis. She was supposed to complete dual antibiotic treatment. She needed more supervision at the house and a social work referral was done. I increased her Sinemet in the interim to 1 pill alternating with 1-1/2 pills. She called in late April reporting more freezing.  UPDATE 06/18/2016 CM Ms. [name], 75 year old female returns for follow-up for Parkinson's disease. She was admitted to the hospital on 06/10/16  due to syncopal episode. She had a previous hospitalization for a week earlier in the month for acute on chronic congestive heart failure. Apparently she fell at home and was found on the floor by caregiver brought to the ER. Etiology was unclear orthostatics were negative imaging on admission was negative for acute findings. She was discharged to a skilled facility for rehabilitation which she is getting physical therapy every day. In terms of her Parkinson's she is doing fairly well. She does not always get her medication on time at the facility. She denies any freezing spells she returns for reevaluation    REVIEW OF SYSTEMS: Full 14 system review of systems performed and notable only for those listed, all others are neg:  Constitutional: neg  Cardiovascular: neg Ear/Nose/Throat: neg  Skin: neg Eyes: neg Respiratory: neg Gastroitestinal: neg  Hematology/Lymphatic: neg  Endocrine: neg Musculoskeletal: Gait abnormality Allergy/Immunology: neg Neurological: neg Psychiatric: neg Sleep : neg   ALLERGIES: Allergies  Allergen Reactions  . Codeine Other (See Comments)    Unknown allergic reaction  . Penicillins Other (See Comments)    Has patient had a PCN reaction causing immediate rash, facial/tongue/throat swelling, SOB or lightheadedness with hypotension: No Has patient had  a PCN reaction causing severe rash involving mucus membranes or skin necrosis: No Has patient had a PCN reaction that required hospitalization No Has patient had a PCN  reaction occurring within the last 10 years: No If all of the above answers are "NO", then may proceed with Cephalosporin use. Pt passed out in doctor's office after penicillin dose    HOME MEDICATIONS: Outpatient Medications Prior to Visit  Medication Sig Dispense Refill  . aspirin EC 81 MG tablet Take 81 mg by mouth daily.    . carbidopa-levodopa (SINEMET IR) 25-100 MG tablet Take 1 at 6a, 1 1/2 pills at 9a, 1 at noon, 1 1/2 pills at 3p, 1 at 6pm, 1 1/2 pills at 9pm 720 tablet 3  . Cholecalciferol (VITAMIN D) 2000 units CAPS Take 2,000 Units by mouth daily. Reported on 04/10/2016    . docusate sodium (COLACE) 100 MG capsule Take 100 mg by mouth daily. Reported on 02/28/2016    . ferrous sulfate 325 (65 FE) MG EC tablet Take 1 tablet (325 mg total) by mouth daily. 90 tablet 3  . furosemide (LASIX) 40 MG tablet Take 1 tablet (40 mg total) by mouth daily.  0  . latanoprost (XALATAN) 0.005 % ophthalmic solution Place 1 drop into the left eye daily at 6 PM.     . LORazepam (ATIVAN) 0.5 MG tablet Take 1 tablet (0.5 mg total) by mouth 2 (two) times daily as needed for anxiety. 20 tablet 0  . metoprolol tartrate (LOPRESSOR) 25 MG tablet Take 1 tablet (25 mg total) by mouth 2 (two) times daily.  0  . Multiple Vitamins-Minerals (MULTIVITAMIN WITH MINERALS) tablet Take 1 tablet by mouth daily.    Marland Kitchen nystatin cream (MYCOSTATIN) Apply 1 application topically 2 (two) times daily.    Marland Kitchen omeprazole (PRILOSEC) 20 MG capsule Take 20 mg by mouth daily.    Marland Kitchen oxyCODONE (OXY IR/ROXICODONE) 5 MG immediate release tablet Take 1 tablet (5 mg total) by mouth 2 (two) times daily as needed for severe pain. 10 tablet 0  . Polyethyl Glycol-Propyl Glycol (SYSTANE OP) Place 1 drop into both eyes daily.     . pramipexole (MIRAPEX) 1 MG tablet Take 1 tablet (1 mg total) by mouth 3 (three) times daily. 270 tablet 3  . triamcinolone cream (KENALOG) 0.1 % Apply 1 application topically 2 (two) times daily.     No  facility-administered medications prior to visit.     PAST MEDICAL HISTORY: Past Medical History:  Diagnosis Date  . Acute on chronic systolic heart failure ([place])   . Anemia   . Anxiety   . Arthritis   . Cataract left  . Cellulitis and abscess of leg, except foot   . Cellulitis of lower leg 12/24/2011  . CHF (congestive heart failure) ([place])   . Chronic bronchitis ([place])    "usually get it q yr"  . Chronic systolic heart failure ([place])   . GERD (gastroesophageal reflux disease)   . Glaucoma left  . Helicobacter pylori (H. pylori)   . History of bleeding peptic ulcer   . History of blood transfusion    "don't remember why"  . Hypercholesterolemia   . Hypertension   . Hypotension, unspecified   . Hypothyroidism   . Parkinson's disease   . Pneumonia   . Sleep apnea    "had test years ago; insurance wouldn't pay for mask so I never had one" (04/27/2015)  . Stroke [facility]) X 3   "that  was the reason I  got Parkinson's"    PAST SURGICAL HISTORY: Past Surgical History:  Procedure Laterality Date  . BACK SURGERY    . ESOPHAGOGASTRODUODENOSCOPY  09/26/2011   Procedure: ESOPHAGOGASTRODUODENOSCOPY (EGD);  Surgeon: Zenovia Jarred, MD;  Location[facility] ENDOSCOPY;  Service: Gastroenterology;  Laterality: N/A;  To be done at bedside.  . ESOPHAGOGASTRODUODENOSCOPY N/A 09/30/2013   Procedure: ESOPHAGOGASTRODUODENOSCOPY (EGD);  Surgeon: [name], MD;  Location: [place];  Service: Endoscopy;  Laterality: N/A;  . ESOPHAGOGASTRODUODENOSCOPY N/A 11/25/2013   Procedure: ESOPHAGOGASTRODUODENOSCOPY (EGD);  Surgeon: [name], MD;  Location: [facility] ENDOSCOPY;  Service: Endoscopy;  Laterality: N/A;  . HEMIARTHROPLASTY HIP Right 09/24/2007   Archie Endo 01/23/2011  . KYPHOPLASTY  06/2009   T12/notes 07/17/2009  . NISSEN FUNDOPLICATION     "had OR for GERD"  . TENDON REPAIR     Gluteus medius [facility] 01/23/2011  . THYROIDECTOMY    . TONSILLECTOMY      FAMILY HISTORY: Family History  Problem Relation Age of  Onset  . GER disease Mother   . Heart disease Father     SOCIAL HISTORY: Social History   Social History  . Marital status: Single    Spouse name: N/A  . Number of children: 0  . Years of education: 12   Occupational History  .      Retired   Social History Main Topics  . Smoking status: Never Smoker  . Smokeless tobacco: Never Used  . Alcohol use No  . Drug use: No  . Sexual activity: No   Other Topics Concern  . Not on file   Social History Narrative   Patient lives at home alone. Patient is retired. Patient has high school education.   Caffeine- one daily.   Right handed.     PHYSICAL EXAM  Vitals:   06/18/16 1300  BP: 140/79  Pulse: 67  Weight: 170 lb (77.1 kg)  Height: 5\' 4"  (1.626 m)   Body mass index is 29.18 kg/m.  Generalized: Well developed, Pleasant female in no acute distress  Head: normocephalic and atraumatic,. Oropharynx benign  Neck: Supple, no carotid bruits  Cardiac: Regular rate rhythm, no murmur  Musculoskeletal: Arthritic changes in the hands  Neurological examination   Mentation: Alert oriented to time, place, history taking. Attention span and concentration appropriate. Recent and remote memory intact.  Follows all commands speech is hypophonic and language fluent.   Cranial nerve II-XII: Pupils were equal round reactive to light extraocular movements were full, visual field were full on confrontational test. There is mild limitation to upgaze. There is mild decreased eye blink. Mild masking of the face with normal facial sensation Facial sensation and strength were normal. hearing was intact to finger rubbing bilaterally. Uvula tongue midline. Tongue protrusion into cheek strength was normal. Motor: normal bulk and tone, full strength in the BUE, BLE, mild generalized dyskinesias noted, mildly reduced tone no cogwheeling. Moderate bradykinesia no pronator drift. No resting tremor  Sensory: normal and symmetric to light touch, in the  upper and lower extremities Coordination: finger-nose-finger, heel-to-shin bilaterally, no dysmetria Reflexes: Symmetric upper and lower plantar responses were flexor bilaterally. Gait and Station: Not ambulated in wheelchair   DIAGNOSTIC DATA (LABS, IMAGING, TESTING) - I reviewed patient records, labs, notes, testing and imaging myself where available.  Lab Results  Component Value Date   WBC 10.0 06/12/2016   HGB 10.3 (L) 06/12/2016   HCT 34.2 (L) 06/12/2016   MCV 79.2 06/12/2016   PLT 183 06/12/2016  Component Value Date/Time   NA 141 06/12/2016 0627   NA 142 02/15/2016 1506   K 3.6 06/12/2016 0627   CL 108 06/12/2016 0627   CO2 27 06/12/2016 0627   GLUCOSE 115 (H) 06/12/2016 0627   BUN 18 06/12/2016 0627   BUN 21 02/15/2016 1506   CREATININE 0.83 06/12/2016 0627   CREATININE 1.68 (H) 06/06/2016 0935   CALCIUM 8.8 (L) 06/12/2016 0627   PROT 7.5 06/10/2016 0957   PROT 5.8 (L) 04/03/2015 1350   ALBUMIN 3.6 06/10/2016 0957   ALBUMIN 3.8 04/03/2015 1350   AST 22 06/10/2016 0957   ALT 13 (L) 06/10/2016 0957   ALKPHOS 63 06/10/2016 0957   BILITOT 0.5 06/10/2016 0957   BILITOT 0.4 04/03/2015 1350   GFRNONAA >60 06/12/2016 0627   GFRAA >60 06/12/2016 0627    Lab Results  Component Value Date   TSH 0.762 05/25/2016      ASSESSMENT AND PLAN 75 year old female with an underlying complex medical history of hypertension, arthritis, anemia,  chronic systolic heart failure and cor pulmonale, peptic ulcer disease, and chronic low back pain with history of T12 compression fracture, status post kyphoplasty in October 2010, who presents for follow-up for  her parkinsonism with Sx dating back to 1999. Her history and physical exam are in keeping with, left-sided predominant PD, complicated by dyskinesias, overall frailty,  freezing and recurrent falls. She has had 2 hospital admissions and 1inpatient rehabilitations in the last month She was admitted to the hospital on 06/10/16   due to syncopal episode. She had a previous hospitalization for a week earlier in the month for acute on chronic congestive heart failure. Apparently she fell at home and was found on the floor by caregiver brought to the ER. Etiology was unclear orthostatics were negative imaging on admission was negative for acute findings. She was discharged to a skilled facility for rehabilitation which she is getting physical therapy every day.The patient is a current patient of Dr. Rexene Alberts  who is out of the office today . This note is sent to the work in doctor.     PLAN: Continue Sinemet and Mirapex at current doses , when she is discharged from inpatient rehabilitation she will require more supervision at home ideally all the time. She does have call button but when she had her syncopal episode she claims she was unconscious for a while.I spent 25 minutes in total face-to-face time with the patient, more than 50% of which was spent in counseling and coordination of care, reviewing test results, recent hospital admissions reviewing medication and discussing and  reviewing the diagnosis of PD, its prognosis and treatment options. Dennie Bible, [facility], [facility], APRN  [facility] [address] [place], [place] [address] [phone]

## 2016-06-19 ENCOUNTER — Other Ambulatory Visit: Payer: Self-pay | Admitting: Licensed Clinical Social Worker

## 2016-06-19 ENCOUNTER — Ambulatory Visit: Payer: Self-pay | Admitting: *Deleted

## 2016-06-19 NOTE — Patient Outreach (Signed)
[facility]) Care Management  06/19/2016  DOT DAHLER [birth year] [id number]   Assessment- CSW has not received a return call from [place], Education officer, museum at [name] since last week. CSW completed call to facility but Lupita Dawn was in a meeting. Front desk wrote down a message requesting a return call to provide to Education officer, museum at [facility].  Plan-CSW will continue to attempt contact with [name] social worker in order to gain updates.  [name], BSW, MSW, [email or web address] Phone: [phone] Fax: [phone]

## 2016-06-19 NOTE — Progress Notes (Signed)
I agree with the assessment and plan as directed by NP .WID   Kariss [place], MD  

## 2016-06-24 ENCOUNTER — Ambulatory Visit: Payer: Medicare Other | Admitting: Cardiovascular Disease

## 2016-06-24 ENCOUNTER — Encounter: Payer: Self-pay | Admitting: *Deleted

## 2016-06-24 ENCOUNTER — Other Ambulatory Visit: Payer: Self-pay | Admitting: Licensed Clinical Social Worker

## 2016-06-24 NOTE — Patient Outreach (Signed)
[facility]) Care Management  06/24/2016  [name] 12-07-1940 [id number]   Assessment- CSW completed outreach to [name] and was able to speak to [name] social worker Roxanne. Roxanne informed [facility] CSW that patient has made great progress in therapy and that they have scheduled a home evaluation which then will be followed by a set discharge date. [name] d/c planner is agreeable to contacting this CSW with updates.  Plan-CSW will make next outreach call to [name] within one week if no updates have been provided.  [name], BSW, MSW, [email or web address] Phone: [phone] Fax: [phone]

## 2016-06-28 ENCOUNTER — Other Ambulatory Visit: Payer: Self-pay | Admitting: Licensed Clinical Social Worker

## 2016-06-28 DIAGNOSIS — H5213 Myopia, bilateral: Secondary | ICD-10-CM | POA: Diagnosis not present

## 2016-06-28 DIAGNOSIS — H353121 Nonexudative age-related macular degeneration, left eye, early dry stage: Secondary | ICD-10-CM | POA: Diagnosis not present

## 2016-06-28 DIAGNOSIS — H2513 Age-related nuclear cataract, bilateral: Secondary | ICD-10-CM | POA: Diagnosis not present

## 2016-06-28 DIAGNOSIS — H43813 Vitreous degeneration, bilateral: Secondary | ICD-10-CM | POA: Diagnosis not present

## 2016-06-28 DIAGNOSIS — H4032X1 Glaucoma secondary to eye trauma, left eye, mild stage: Secondary | ICD-10-CM | POA: Diagnosis not present

## 2016-06-28 DIAGNOSIS — H353112 Nonexudative age-related macular degeneration, right eye, intermediate dry stage: Secondary | ICD-10-CM | POA: Diagnosis not present

## 2016-06-28 NOTE — Patient Outreach (Signed)
[place] [facility]) Care Management  06/28/2016  BAO MOOREFIELD 01-24-1941 [id number]   Assessment- CSW completed outreach to [name] in order to gain updates from social worker. CSW was on hold while front desk paged her but was informed that Lupita Dawn was out of the office. CSW left a message with front desk employee for social worker to return call in order to gain updates on patients. However, CSW was able to confirm that patient is still at facility.  Plan-CSW will await return call or will complete additional outreach within one week.   [name], BSW, MSW, [email or web address] Phone: [phone] Fax: [phone]

## 2016-07-02 ENCOUNTER — Other Ambulatory Visit: Payer: Self-pay | Admitting: Licensed Clinical Social Worker

## 2016-07-02 NOTE — Patient Outreach (Signed)
[name] [facility]) Care Management  [facility] Social Work  07/02/2016  IYAH MIKOL August 07, 1941 [id number]   Encounter Medications:  Outpatient Encounter Prescriptions as of 07/02/2016  Medication Sig  . aspirin EC 81 MG tablet Take 81 mg by mouth daily.  . carbidopa-levodopa (SINEMET IR) 25-100 MG tablet Take 1 at 6a, 1 1/2 pills at 9a, 1 at noon, 1 1/2 pills at 3p, 1 at 6pm, 1 1/2 pills at 9pm (Patient taking differently: Take 1 at 6a, 1 1/2 pills at 9a, 1 at 1p, 1 1/2 pills at 3p, 1 at 6pm, 1 1/2 pills at 9pm)  . Cholecalciferol (VITAMIN D) 2000 units CAPS Take 2,000 Units by mouth daily. Reported on 04/10/2016  . docusate sodium (COLACE) 100 MG capsule Take 100 mg by mouth daily. Reported on 02/28/2016  . ferrous sulfate 325 (65 FE) MG EC tablet Take 1 tablet (325 mg total) by mouth daily.  . furosemide (LASIX) 40 MG tablet Take 1 tablet (40 mg total) by mouth daily.  Marland Kitchen latanoprost (XALATAN) 0.005 % ophthalmic solution Place 1 drop into the left eye daily at 6 PM.   . LORazepam (ATIVAN) 0.5 MG tablet Take 1 tablet (0.5 mg total) by mouth 2 (two) times daily as needed for anxiety.  . metoprolol tartrate (LOPRESSOR) 25 MG tablet Take 1 tablet (25 mg total) by mouth 2 (two) times daily.  . Multiple Vitamins-Minerals (MULTIVITAMIN WITH MINERALS) tablet Take 1 tablet by mouth daily.  Marland Kitchen nystatin cream (MYCOSTATIN) Apply 1 application topically 2 (two) times daily.  Marland Kitchen omeprazole (PRILOSEC) 20 MG capsule Take 20 mg by mouth daily.  Marland Kitchen oxyCODONE (OXY IR/ROXICODONE) 5 MG immediate release tablet Take 1 tablet (5 mg total) by mouth 2 (two) times daily as needed for severe pain.  Vladimir Faster Glycol-Propyl Glycol (SYSTANE OP) Place 1 drop into both eyes daily.   . pramipexole (MIRAPEX) 1 MG tablet Take 1 tablet (1 mg total) by mouth 3 (three) times daily.  Marland Kitchen triamcinolone cream (KENALOG) 0.1 % Apply 1 application topically 2 (two) times daily.   No facility-administered encounter medications  on file as of 07/02/2016.     Functional Status:  In your present state of health, do you have any difficulty performing the following activities: 05/27/2016 05/27/2016  Hearing? N -  Vision? N -  Difficulty concentrating or making decisions? N -  Walking or climbing stairs? Y -  Dressing or bathing? Y -  Doing errands, shopping? Y Y  Preparing Food and eating ? - -  Using the Toilet? - -  In the past six months, have you accidently leaked urine? - -  Do you have problems with loss of bowel control? - -  Managing your Medications? - -  Managing your Finances? - -  Housekeeping or managing your Housekeeping? - -  Some recent data might be hidden    Fall/Depression Screening:  PHQ 2/9 Scores 06/06/2016 05/14/2016 03/29/2016 03/19/2016 02/21/2016 01/30/2016 01/26/2016  PHQ - 2 Score 0 0 0 0 0 0 0    Assessment: CSW completed [name] visit on 07/02/16. Patient was in her room, lying down on the bed when CSW arrived. Patient reports that she is doing "well." Patient states that her roommate left a few days ago and that they have not put anyone else in her room. She states that she misses her new friend but they exchanged contact information in order to continue a friendship. Patient reports that she has been participating in many activities at  [name] such as bingo, cards, puzzles as a group, church services, kick ball exercise group and getting her nails painted. Patient reports that she has not been walking at all by herself. She states that her brother brought her walker to the facility because she did not like using the one that [name] provided. CSW questioned if Huntley Estelle would continue being her aide once she returns home. She reports she is thinks so. CSW contacted [name] with patient's consent. [name] states that she would be happy to help patient again once she returns home and to just give her a call. Speech therapist arrived but stated she would come back at a later time since CSW was meeting with  patient. Patient's brother and his wife arrived to facility while CSW was there. He reports that he received a call from Cookie with admissions who wanted to discuss long term Medicaid. Brother picked up dirty clothes for patient and CSW showed patient Cookie's office. Cookie reports that patient's last day that insurance will pay for her to stay is 07/04/16. Cookie states that it will be $164.00 per day to continue to stay after that. She asked for financial information on patient. Patient current monthly income is $2,355 which includes her SS income which is $1,284 and her pension being $1,071. Cookie is going to apply for long term Medicaid for patient as patient and family feel that she needs to stay longer at facility. However, patient's brother informed CSW later that "She does not like to give out her financial information. She has a lot of assets and I know she will not qualify for Medicaid. She has over $50,000 in one account but does not like telling people" Brother states that patient will be able to afford $164.00 per day if she needs to stay at facility longer. He reports that he is her main support but that he can only do so much because he is the main caretaker of his wife who suffers from severe dementia. Brother reports that he is coming to [name] almost daily to assist patient.   Plan: CSW will follow up with patient within one week to gain discharge updates.  [name], BSW, MSW, [email or web address] Phone: [phone] Fax: [phone]

## 2016-07-04 ENCOUNTER — Other Ambulatory Visit: Payer: Self-pay | Admitting: Licensed Clinical Social Worker

## 2016-07-04 ENCOUNTER — Non-Acute Institutional Stay (SKILLED_NURSING_FACILITY): Payer: Medicare Other | Admitting: Adult Health

## 2016-07-04 ENCOUNTER — Encounter: Payer: Self-pay | Admitting: Adult Health

## 2016-07-04 DIAGNOSIS — G20 Parkinson's disease: Secondary | ICD-10-CM

## 2016-07-04 DIAGNOSIS — R55 Syncope and collapse: Secondary | ICD-10-CM

## 2016-07-04 DIAGNOSIS — I5022 Chronic systolic (congestive) heart failure: Secondary | ICD-10-CM | POA: Diagnosis not present

## 2016-07-04 DIAGNOSIS — R5381 Other malaise: Secondary | ICD-10-CM

## 2016-07-04 DIAGNOSIS — I130 Hypertensive heart and chronic kidney disease with heart failure and stage 1 through stage 4 chronic kidney disease, or unspecified chronic kidney disease: Secondary | ICD-10-CM | POA: Diagnosis not present

## 2016-07-04 NOTE — Progress Notes (Signed)
Patient ID: [name], female   DOB: 01-29-1941, 75 y.o.   MRN: [id number]   Location:   [place] Room Number: 162-B Place of Service:  [name] (31)    CODE STATUS: Full Code  Allergies  Allergen Reactions  . Codeine Other (See Comments)    Unknown allergic reaction  . Penicillins Other (See Comments)    Chief Complaint  Patient presents with  . Discharge Note    Discharge from facility    HPI:  She is being discharged to home with home health for pt/ot/rn. She will need a 3;1 commode. She will need her prescriptions written and will need to follow up with her pcp.  She was hospitalized for syncope and her chf. She was admitted to this facility for short term rehab.   Past Medical History:  Diagnosis Date  . Acute on chronic systolic heart failure ([place])   . Anemia   . Anxiety   . Arthritis   . Cataract left  . Cellulitis and abscess of leg, except foot   . Cellulitis of lower leg 12/24/2011  . CHF (congestive heart failure) ([place])   . Chronic bronchitis ([place])    "usually get it q yr"  . Chronic systolic heart failure ([place])   . GERD (gastroesophageal reflux disease)   . Glaucoma left  . Helicobacter pylori (H. pylori)   . History of bleeding peptic ulcer   . History of blood transfusion    "don't remember why"  . Hypercholesterolemia   . Hypertension   . Hypotension, unspecified   . Hypothyroidism   . Parkinson's disease   . Pneumonia   . Sleep apnea    "had test years ago; insurance wouldn't pay for mask so I never had one" (04/27/2015)  . Stroke [facility]) X 3   "that  was the reason I got Parkinson's"    Past Surgical History:  Procedure Laterality Date  . BACK SURGERY    . ESOPHAGOGASTRODUODENOSCOPY  09/26/2011   Procedure: ESOPHAGOGASTRODUODENOSCOPY (EGD);  Surgeon: Zenovia Jarred, MD;  Location: [facility] ENDOSCOPY;  Service: Gastroenterology;  Laterality: N/A;  To be done at bedside.  . ESOPHAGOGASTRODUODENOSCOPY N/A 09/30/2013   Procedure:  ESOPHAGOGASTRODUODENOSCOPY (EGD);  Surgeon: [name], MD;  Location: [facility];  Service: Endoscopy;  Laterality: N/A;  . ESOPHAGOGASTRODUODENOSCOPY N/A 11/25/2013   Procedure: ESOPHAGOGASTRODUODENOSCOPY (EGD);  Surgeon: [name], MD;  Location: [facility];  Service: Endoscopy;  Laterality: N/A;  . HEMIARTHROPLASTY HIP Right 09/24/2007   Archie Endo 01/23/2011  . KYPHOPLASTY  06/2009   T12/notes 07/17/2009  . NISSEN FUNDOPLICATION     "had OR for GERD"  . TENDON REPAIR     Gluteus medius Ar[facility] Endo 01/23/2011  . THYROIDECTOMY    . TONSILLECTOMY      Social History   Social History  . Marital status: Single    Spouse name: N/A  . Number of children: 0  . Years of education: 12   Occupational History  .      Retired   Social History Main Topics  . Smoking status: Never Smoker  . Smokeless tobacco: Never Used  . Alcohol use No  . Drug use: No  . Sexual activity: No   Other Topics Concern  . Not on file   Social History Narrative   Patient lives at home alone. Patient is retired. Patient has high school education.   Caffeine- one daily.   Right handed.   Family History  Problem Relation Age of Onset  .  GER disease Mother   . Heart disease Father     VITAL SIGNS BP 113/66   Pulse 79   Temp (!) 96.6 F (35.9 C) (Oral)   Resp 17   Ht 5\' 4"  (1.626 m)   Wt 175 lb 2 oz (79.4 kg)   SpO2 96%   BMI 30.06 kg/m   Patient's Medications  New Prescriptions   No medications on file  Previous Medications   ASPIRIN EC 81 MG TABLET    Take 81 mg by mouth daily.   CARBIDOPA-LEVODOPA (SINEMET IR) 25-100 MG TABLET    Take 1 at 6a, 1 1/2 pills at 9a, 1 at noon, 1 1/2 pills at 3p, 1 at 6pm, 1 1/2 pills at 9pm   CHOLECALCIFEROL (VITAMIN D) 2000 UNITS CAPS    Take 2,000 Units by mouth daily. Reported on 04/10/2016   DOCUSATE SODIUM (COLACE) 100 MG CAPSULE    Take 100 mg by mouth daily. Reported on 02/28/2016   FERROUS SULFATE 325 (65 FE) MG EC TABLET    Take 1 tablet (325 mg  total) by mouth daily.   FUROSEMIDE (LASIX) 40 MG TABLET    Take 1 tablet (40 mg total) by mouth daily.   LATANOPROST (XALATAN) 0.005 % OPHTHALMIC SOLUTION    Place 1 drop into the left eye daily at 6 PM.    LORAZEPAM (ATIVAN) 0.5 MG TABLET    Take 1 tablet (0.5 mg total) by mouth 2 (two) times daily as needed for anxiety.   METOPROLOL TARTRATE (LOPRESSOR) 25 MG TABLET    Take 1 tablet (25 mg total) by mouth 2 (two) times daily.   MULTIPLE VITAMINS-MINERALS (MULTIVITAMIN WITH MINERALS) TABLET    Take 1 tablet by mouth daily.   NYSTATIN CREAM (MYCOSTATIN)    Apply 1 application topically 2 (two) times daily.   OMEPRAZOLE (PRILOSEC) 20 MG CAPSULE    Take 20 mg by mouth daily.   OXYCODONE (OXY IR/ROXICODONE) 5 MG IMMEDIATE RELEASE TABLET    Take 1 tablet (5 mg total) by mouth 2 (two) times daily as needed for severe pain.   POLYETHYL GLYCOL-PROPYL GLYCOL (SYSTANE OP)    Place 1 drop into both eyes daily.    PRAMIPEXOLE (MIRAPEX) 1 MG TABLET    Take 1 tablet (1 mg total) by mouth 3 (three) times daily.   TRIAMCINOLONE CREAM (KENALOG) 0.1 %    Apply 1 application topically 2 (two) times daily.  Modified Medications   No medications on file  Discontinued Medications   No medications on file     SIGNIFICANT DIAGNOSTIC EXAMS  05-27-16: 2-d echo: - Left ventricle: The cavity size was normal. Wall thickness was increased in a pattern of mild LVH. Systolic function was mildly to moderately reduced. The estimated ejection fraction was in the range of 40% to 45%. Diffuse hypokinesis. Features are consistent with a pseudonormal left ventricular filling pattern, with concomitant abnormal relaxation and increased filling pressure (grade 2 diastolic dysfunction). Doppler parameters are consistent with high ventricular filling pressure. - Aortic valve: Moderately calcified annulus. Trileaflet; mildly calcified leaflets. 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 There was trivial regurgitation.    - Mitral valve:  Calcified annulus. - Left atrium: The atrium was severely dilated. - Right ventricle: Systolic function was mildly reduced. - Right atrium: The atrium was mildly to moderately dilated. - Atrial septum: There was a patent foramen ovale. - Tricuspid valve: There was mild-moderate regurgitation.   06-10-16: chest x-ray: Cardiomegaly with mild pulmonary vascular congestion.  06-10-16: ct of head and cervical spine: 1. 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 2. No acute intracranial abnormality. 3. No acute fracture or listhesis identified in the cervical spine. Ligamentous injury is not excluded. Chronic cervical spine degeneration appears stable. 4. 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   06-10-16: CT angio of head: Negative CT angiography of the large and medium size vessels. Ordinary siphon atherosclerosis.  06-11-16: mri of brain: 1. No acute intracranial process identified. 2. Remote left frontal and smaller left parietal cortical infarcts. 3. Mild chronic microvascular ischemic disease.    LABS REVIEWED:   06-10-16: wbc 7.1; hgb 11.8; hct 39.3; mcv 79. 6 plt 190; glucose 125; bun 35; creat 0.96; k+ 3.9; na++ 143; liver normal albumin 3.6 blood culture X 2 no growth 06-12-16: wbc 10.0; hgb 10.3; hct 34.2; mcv 79.2; plt 183; glucose 115; bun 18; creat 0.83; k+ 3.6; na++ 141    Review of Systems  Constitutional: Positive for malaise/fatigue.  HENT:       Has weak voice   Respiratory: Negative for cough and shortness of breath.   Cardiovascular: Negative for chest pain, palpitations and leg swelling.  Gastrointestinal: Negative for abdominal pain, constipation and heartburn.  Musculoskeletal: Negative for back pain, joint pain and myalgias.  Skin: Negative.   Neurological: Negative for dizziness.  Psychiatric/Behavioral: The patient is not nervous/anxious.     Physical Exam  Constitutional: No distress.  Eyes: Conjunctivae are normal.  Neck:  Neck supple. No JVD present. No thyromegaly present.  Cardiovascular: Normal rate, regular rhythm and intact distal pulses.   Murmur heard. 2/6  Respiratory: Effort normal and breath sounds normal. No respiratory distress. She has no wheezes.  Has dyspnea on exertion   GI: Soft. Bowel sounds are normal. She exhibits no distension. There is no tenderness.  Musculoskeletal: She exhibits no edema.  Able to move all extremities  Generalized weakness present   Lymphadenopathy:    She has no cervical adenopathy.  Neurological: She is alert.  Skin: Skin is warm and dry. She is not diaphoretic.  Has pink stasis in bilateral lower extremities no open lesions present Does have some tenderness bilaterally present   Psychiatric: She has a normal mood and affect.       ASSESSMENT/ PLAN:  Patient is being discharged with the following home health services:  Pt/ot/rn: to evaluate and treat as indicated for gait; balance; strength; adl training and medication management   Patient is being discharged with the following durable medical equipment:  3:1 commode  Patient has been advised to f/u with their PCP in 1-2 weeks to bring them up to date on their rehab stay.  Social services at facility was responsible for arranging this appointment.  Pt was provided with a 30 day supply of prescriptions for medications and refills must be obtained from their PCP.  For controlled substances, a more limited supply may be provided adequate until PCP appointment only.#10 oxycodone 5 mg tabs and #10 ativan 0.5 mg tabs.    Time spent with patient  40   minutes >50% time spent counseling; reviewing medical record; tests; labs; and developing future plan of care    Ok Edwards NP [facility] Adult Medicine  Contact [phone] Monday through Friday 8am- 5pm  After hours call [phone]

## 2016-07-04 NOTE — Patient Outreach (Signed)
[facility]) Care Management  07/04/2016  AIYANNAH DISHMAN [birth year] [id number]   Assessment- CSW received call from [place] with [name]. She reports that patient is discharging from [name] tomorrow and will be going home with [facility] again.  Plan-CSW will send in basket message to [facility]. CSW will follow up within one week.   [name], BSW, MSW, [email or web address] Phone: [phone] Fax: [phone]

## 2016-07-05 ENCOUNTER — Ambulatory Visit: Payer: Self-pay | Admitting: Internal Medicine

## 2016-07-08 ENCOUNTER — Other Ambulatory Visit: Payer: Self-pay | Admitting: Licensed Clinical Social Worker

## 2016-07-08 ENCOUNTER — Encounter: Payer: Self-pay | Admitting: *Deleted

## 2016-07-08 ENCOUNTER — Other Ambulatory Visit: Payer: Self-pay | Admitting: *Deleted

## 2016-07-08 NOTE — Patient Outreach (Signed)
[facility]) Care Management [place] Telephone Outreach, Transition of Care Attempt #1 07/08/2016  APONI GROOMES September 01, 1941 [id number]  Unsuccessful telephone outreach attempt to [name], 75 y/o female well known to [place] for multiple hospitalizations and safety concerns.  Constantine was most recently admitted to the hospital September 18-20, 2017,after experiencing syncopal episode/ AMS at home.  Patient was discharged to [name] for rehabiliation, and was subsequently discharged home from [facility] on July 05, 2016.   [facility] CSW is also actively [employer] care for [employer] evaluation.   Unable to leave patient voicemail message, as the automated outgoing messaging stated, "please enter your remote access code," and would not accept an incoming message.  Plan:  [place] telephone outreach for transition of care after [name] discharge to be re-attempted tomorrow.   [name], RN, BSN, [employer] [facility] Care Management  [phone]

## 2016-07-08 NOTE — Patient Outreach (Signed)
[facility]) Care Management  07/08/2016  GURNOOR LAHN 1941/01/31 [id number]   Assessment- CSW completed outreach to patient. Patient answered. Patient discharged back home from [name] on 07/05/16 with home health services. Patient reports that she is doing well. CSW is concerned about her transition back home and would like to schedule home visit. She is agreeable to home visit on 07/09/16.  Plan-CSW will complete home visit this week.  [name], BSW, MSW, [email or web address] Phone: [phone] Fax: [phone]

## 2016-07-09 ENCOUNTER — Other Ambulatory Visit: Payer: Self-pay | Admitting: *Deleted

## 2016-07-09 ENCOUNTER — Ambulatory Visit: Payer: Self-pay | Admitting: *Deleted

## 2016-07-09 ENCOUNTER — Encounter: Payer: Self-pay | Admitting: *Deleted

## 2016-07-09 ENCOUNTER — Other Ambulatory Visit: Payer: Self-pay | Admitting: Licensed Clinical Social Worker

## 2016-07-09 NOTE — Patient Outreach (Signed)
[facility]) Care Management  [facility] Social Work  07/09/2016  [name] Apr 24, 1941 [id number]  Encounter Medications:  Outpatient Encounter Prescriptions as of 07/09/2016  Medication Sig  . aspirin EC 81 MG tablet Take 81 mg by mouth daily.  . carbidopa-levodopa (SINEMET IR) 25-100 MG tablet Take 1 at 6a, 1 1/2 pills at 9a, 1 at noon, 1 1/2 pills at 3p, 1 at 6pm, 1 1/2 pills at 9pm (Patient taking differently: Take 1 at 6a, 1 1/2 pills at 9a, 1 at 1p, 1 1/2 pills at 3p, 1 at 6pm, 1 1/2 pills at 9pm)  . Cholecalciferol (VITAMIN D) 2000 units CAPS Take 2,000 Units by mouth daily. Reported on 04/10/2016  . docusate sodium (COLACE) 100 MG capsule Take 100 mg by mouth daily. Reported on 02/28/2016  . ferrous sulfate 325 (65 FE) MG EC tablet Take 1 tablet (325 mg total) by mouth daily.  . furosemide (LASIX) 40 MG tablet Take 1 tablet (40 mg total) by mouth daily.  Marland Kitchen latanoprost (XALATAN) 0.005 % ophthalmic solution Place 1 drop into the left eye daily at 6 PM.   . LORazepam (ATIVAN) 0.5 MG tablet Take 1 tablet (0.5 mg total) by mouth 2 (two) times daily as needed for anxiety.  . metoprolol tartrate (LOPRESSOR) 25 MG tablet Take 1 tablet (25 mg total) by mouth 2 (two) times daily.  . Multiple Vitamins-Minerals (MULTIVITAMIN WITH MINERALS) tablet Take 1 tablet by mouth daily.  Marland Kitchen nystatin cream (MYCOSTATIN) Apply 1 application topically 2 (two) times daily.  Marland Kitchen omeprazole (PRILOSEC) 20 MG capsule Take 20 mg by mouth daily.  Marland Kitchen oxyCODONE (OXY IR/ROXICODONE) 5 MG immediate release tablet Take 1 tablet (5 mg total) by mouth 2 (two) times daily as needed for severe pain.  Vladimir Faster Glycol-Propyl Glycol (SYSTANE OP) Place 1 drop into both eyes daily.   . pramipexole (MIRAPEX) 1 MG tablet Take 1 tablet (1 mg total) by mouth 3 (three) times daily.  Marland Kitchen triamcinolone cream (KENALOG) 0.1 % Apply 1 application topically 2 (two) times daily.   No facility-administered encounter medications on  file as of 07/09/2016.     Functional Status:  In your present state of health, do you have any difficulty performing the following activities: 07/09/2016 05/27/2016  Hearing? N N  Vision? N N  Difficulty concentrating or making decisions? N N  Walking or climbing stairs? Y Y  Dressing or bathing? Y Y  Doing errands, shopping? Y Y  Preparing Food and eating ? - -  Using the Toilet? - -  In the past six months, have you accidently leaked urine? - -  Do you have problems with loss of bowel control? - -  Managing your Medications? - -  Managing your Finances? - -  Housekeeping or managing your Housekeeping? - -  Some recent data might be hidden    Fall/Depression Screening:  PHQ 2/9 Scores 07/09/2016 06/06/2016 05/14/2016 03/29/2016 03/19/2016 02/21/2016 01/30/2016  PHQ - 2 Score 0 0 0 0 0 0 0    Assessment: CSW completed home visit today on 07/09/16. CSW spoke with [facility] yesterday who stated that she had difficulty reaching patient. CSW informed patient and patient wished for CSW to contact RNCM for her. Patient was agreeable to [facility] completing home visit after CSW. Patient's aide was there. Patient's aide reports that patient is experiencing weakness today but has maintained a good appetite. Patient was unable to get up from chair to move to living room today due to  weakness. She reports "That's the first time that has happened since I got back home." She reports that her legs are weak. She shares that she continues to walk with her walker but has had difficulty doing so today. She reports that her aide gave her a bath yesterday. CSW questioned if Home Health had started. She reports that [facility] declined her because they did not have staff available. She shares that a different [place] contacted her brother [name] yesterday but she does not know the agency name or when they will start services. Patient shares that the company might be [employer] but she is not sure. CSW encouraged  patient to discuss this with RNCM. CSW questioned if she has been implementing appropriate coping skills into her routine since she discharged back home. She reports that she has been calling her new friend from [name]. She states that she has been listening to music as well. She denies going outside of her house since she returned home from [name]. She shares that she does not need CSW to assist with contacting [employer]. She reports that she has already contacted [employer] and that they started back yesterday. She reports that her brother [name] continues to bring supper in the evenings. Patient denies having any recent falls. She shares that she has asked her aide [name] to increase her hours and assist her 5 days per week for 3 hours per day. Patient reports that she received a concerning bill from [name] today and wants CSW to assist with finding out information. CSW contacted [name] for patient with her permission and was informed that patient needs to pay $493.50 for the extra days (any days after her 21st day) she stayed at [facility]. Patient appreciative of CSW assistance and agrees to pay bill today.   Plan: CSW will follow up within one month. CSW will continue to provide social work assistance.  [facility] CM Care Plan Problem Three   Flowsheet Row Most Recent Value  Care Plan for Problem Three  Active  THN Long Term Goal (31-90) days  Patient will implement healthy coping tools into her routine within 90 days as evidenced by her isolation and need for fulfilment.  THN Long Term Goal Start Date  05/14/16  Interventions for Problem Three Long Term Goal  CSW will complete home visit and encourage patient to do activities to improve her well being such as playing music to decrease sadness (patient recently got an ukulee, going to church, going out in the community and spending time with family.   [facility] CM Short Term Goal #1 (0-30 days)  Patient will have a safe and stable discharge back home from [name] within 30 days  due to recent [name] admission at [address] #1 Start Date  06/13/16  [facility] CM Short Term Goal #1 Met Date  07/09/16  Interventions for Short Term Goal #1  Patient had a safe and stable discharge . They recommended bedside commode but she already has one. Home Health  is going to start soon. Goal met.,  [facility] CM Short Term Goal #2 (0-30 days)  Patient will continue to clean her home to allow her more mobility in her home as evidenced by her hoarding and need for more room in her home to prevent falls and danger  [facility] CM Short Term Goal #2 Start Date  05/14/16  [facility] CM Short Term Goal #2 Met Date  06/13/16  Interventions for Short Term Goal #  2  CSW will provide emotional support and will encourage her to clean home as needed. Patient has a new caregiver who has already assisted her with cleaning her home but patient wishes to do more within the next 30 days.  [facility] CM Short Term Goal #3 (0-30 days)  Patient will continue to gain aide services for the next 30 days as evidenced by her need for support since [name] discharge  THN  CM Short Term Goal #3 Start Date  07/09/16  Interventions for Short Term Goal #3  CSW will encourage patient to have aide services since she has recently discharged from [name]. Huntley Estelle is her aide and she is hopeful to increase hours per week due to recent discharge from [name] and need for ongoing support.     [name], BSW, MSW, [email or web address] Phone: [phone] Fax: [phone]

## 2016-07-09 NOTE — Patient Outreach (Signed)
[facility]) Care Management  [facility] CM Routine Home Visit, Transition of Care day 1 07/09/2016  [name] 25-Mar-1941 [id number]  [name] is an 75 y.o. female well known to [place] for multiple hospitalizations and safety concerns.  [name] most recently admitted to the hospital September 18-20, 2017,after experiencing syncopal episode/ AMS at home.  Patient was discharged to [name] for rehabiliation, and was subsequently discharged home from [facility] on July 05, 2016. [facility] CSW isalso actively [employer] care for [employer] evaluation.   Today, [name] reports that she is "doing pretty good," and is happy to be back at home after [name] visit post-hospital discharge.  [name] personal care assistant "[name]" is present in home and reports that she will be working with [name] "5 days a week for 3 hours every day."   There is visible improvement in the appearance of both [name] and her home environment from previous [place] in-home visits since [place] has been working with her.   [name] reports today that her brother [name] has spoken with home health ([place]) agency [name], and verbalizes that she is currently waiting to hear back from agency re: scheduling of home visits.  [name]'s brother [name] was present at end of [place] home visit, and confirmed that he has spoken with [place] agency.  I encouraged both patient and her brother to follow up on [facility] service status if they did not hear back from agency and offered to provide assistance as needed.  [name] continues actively working with [facility] CSW [name] on identified [employer] needs, and she reports today ongoing support and assistance with her needs through her brother [name], who is present today to take patient to get [name] eyeglasses.   Today, [name] reports needing no assistance with medication management, which we had worked on extensively on during previous [place] home  visits; however, several of her pill bottles remained scattered throughout her home today and she again provided inconsistent reporting of medication management.  During medication review, patient stated that she did not have metoprolol and "had never taken that" medication; however, this bottle of medications was later found by her personal caregiver.  When the medication bottle was located, [name] then reported that she HAS been taking the medication as it is prescribed.  During previous [place] in-home visits, [name] and I created together a centralized location (plastic bin) to keep her medications in.  Today, there are numerous medication bottles scattered throughout her bedroom, not in the centralized bin location.  I strongly encouraged both [name] and her brother to discuss all current medications with [name]'s PCP at scheduled office visit later this week, and they agreed to do so.  [name] was recently discharged from [name] and all medications were reviewed with her today.  Importance of understanding purpose of medications and medication adherence/ compliance was encouraged:    During previous [place] in-home visits, [name] and I were also working on Research scientist (medical) of Parkinson's Disease, CHF, HTN, LE cellulitis: due to [name] severe state of Parkinson's disease, she is unable to weigh herself regularly, as she is not steady on her feet and requires the use of a rolling walker 100% of the time.  [name] and I assisted [name] in obtaining her weight today, 152 lbs/ (patient's scales).  Today, there is no change in [name] breathing from her baseline; she becomes short of breath with activity and recovers quickly  when she rests.  She is not in distress during our visittoday.  [name] bilateral LE continue to be slightly erythematous, but have no swelling noted. (see ROS/PE).  [name] and [place] report that [name] has continued using prescribed creams for lower extremity cellulitis.   We again  discussed the need for [facility] to contact her providers early if she develops concerns about her state of health, and she agreed to do so. [name] denied further questions, problems, issues, or concerns today, and confirms that she has my direct phone number, the main office number for [facility] CM, and the [facility] 24-hour nurse advice line phone number.  Subjective: "I am so happy to be back at home."  Objective:   BP 118/68   Pulse 80   Resp 14   Wt 152 lb (68.9 kg)   SpO2 95%   BMI 26.09 kg/m     Review of Systems  Constitutional: Positive for malaise/fatigue and weight loss. Negative for fever.       Patient reports "I lost a lot of weight while I was in the [name]."  Weighed today on home scale:  152 pounds with clothing/ tennis shoes on  Respiratory: Positive for shortness of breath. Negative for cough, sputum production and wheezing.        Patient has slight shortness of breath with activity as baseline; recovers within 10 seconds when resting after activity  Cardiovascular: Positive for orthopnea. Negative for chest pain, palpitations and leg swelling.       Patient reports that she sleeps in her recliner "all the time."  Gastrointestinal: Negative.  Negative for abdominal pain, constipation, diarrhea and nausea.  Genitourinary: Positive for frequency. Negative for dysuria.       Patient reports occasional urinary frequency, secondary to "fluid pills."  Musculoskeletal: Positive for myalgias. Negative for falls.  Skin: Negative.   Neurological: Positive for weakness. Negative for dizziness.  Psychiatric/Behavioral: The patient is not nervous/anxious.     Physical Exam  Constitutional: She is oriented to person, place, and time. She appears well-developed and well-nourished. No distress.  Cardiovascular: Normal rate, regular rhythm and normal heart sounds.   Respiratory: Effort normal and breath sounds normal. No respiratory distress. She has no wheezes. She has no rales.  GI: Soft. Bowel  sounds are normal.  Musculoskeletal: She exhibits no edema.  Neurological: She is alert and oriented to person, place, and time.  Skin: Skin is warm and dry. There is erythema.  Bilateral lower extremities are slightly erythematous  Psychiatric: She has a normal mood and affect. Her behavior is normal. Judgment and thought content normal.    Encounter Medications:   Outpatient Encounter Prescriptions as of 07/09/2016  Medication Sig Note  . aspirin EC 81 MG tablet Take 81 mg by mouth daily.   . carbidopa-levodopa (SINEMET IR) 25-100 MG tablet Take 1 at 6a, 1 1/2 pills at 9a, 1 at noon, 1 1/2 pills at 3p, 1 at 6pm, 1 1/2 pills at 9pm (Patient taking differently: Take 1 at 6a, 1 1/2 pills at 9a, 1 at 1p, 1 1/2 pills at 3p, 1 at 6pm, 1 1/2 pills at 9pm)   . Cholecalciferol (VITAMIN D) 2000 units CAPS Take 2,000 Units by mouth daily. Reported on 04/10/2016   . docusate sodium (COLACE) 100 MG capsule Take 100 mg by mouth daily. Reported on 02/28/2016   . ferrous sulfate 325 (65 FE) MG EC tablet Take 1 tablet (325 mg total) by mouth daily.   . furosemide (LASIX) 40  MG tablet Take 1 tablet (40 mg total) by mouth daily.   Marland Kitchen latanoprost (XALATAN) 0.005 % ophthalmic solution Place 1 drop into the left eye daily at 6 PM.    . LORazepam (ATIVAN) 0.5 MG tablet Take 1 tablet (0.5 mg total) by mouth 2 (two) times daily as needed for anxiety.   . Multiple Vitamins-Minerals (MULTIVITAMIN WITH MINERALS) tablet Take 1 tablet by mouth daily.   Marland Kitchen nystatin cream (MYCOSTATIN) Apply 1 application topically 2 (two) times daily.   Marland Kitchen omeprazole (PRILOSEC) 20 MG capsule Take 20 mg by mouth daily.   Marland Kitchen oxyCODONE (OXY IR/ROXICODONE) 5 MG immediate release tablet Take 1 tablet (5 mg total) by mouth 2 (two) times daily as needed for severe pain.   Vladimir Faster Glycol-Propyl Glycol (SYSTANE OP) Place 1 drop into both eyes daily.    . pramipexole (MIRAPEX) 1 MG tablet Take 1 tablet (1 mg total) by mouth 3 (three) times daily.   Marland Kitchen  triamcinolone cream (KENALOG) 0.1 % Apply 1 application topically 2 (two) times daily.   . metoprolol tartrate (LOPRESSOR) 25 MG tablet Take 1 tablet (25 mg total) by mouth 2 (two) times daily. (Patient not taking: Reported on 07/09/2016) 07/09/2016: Patient states she has never been prescribed this medication   No facility-administered encounter medications on file as of 07/09/2016.     Functional Status:   In your present state of health, do you have any difficulty performing the following activities: 07/09/2016 05/27/2016  Hearing? N N  Vision? N N  Difficulty concentrating or making decisions? N N  Walking or climbing stairs? Y Y  Dressing or bathing? Y Y  Doing errands, shopping? Y Y  Preparing Food and eating ? - -  Using the Toilet? - -  In the past six months, have you accidently leaked urine? - -  Do you have problems with loss of bowel control? - -  Managing your Medications? - -  Managing your Finances? - -  Housekeeping or managing your Housekeeping? - -  Some recent data might be hidden    Fall/Depression Screening:    PHQ 2/9 Scores 07/09/2016 06/06/2016 05/14/2016 03/29/2016 03/19/2016 02/21/2016 01/30/2016  PHQ - 2 Score 0 0 0 0 0 0 0    Assessment:  Safety issues with Annakate's medicationsthat were identified during [place] in-home visit continue to persist, despite previous efforts made to correct.  Summer verbalizes inconsistent reporting of her current medications since her discharge home from recent hospital/ [name] visit.  Jaz has a supportive brother who actively participates in meeting her care needs.  Conor is committed to keeping her hospital follow up provider appointments, and will discuss her medications with her PCP during scheduled office visit later this week.  Doloris agrees to contact her providers promptly for any [name] concerns, issues, or problems that arise.  Plan:   Yanick will take her medications as prescribed and will attend all scheduled provider  appointments.  Tequita will discuss her current medication regimen with her PCP during scheduled office visit.  Ms. Wendorf will continue actively working with [facility] services, [facility] RN CM, and [facility] CSW.  Ms. Mcleish will notify her providers for any concerns, issues, problems, or questions that arise.  Continued [facility] CM telephone outreach for ongoing transition of care scheduled next week; [place] CM Routine home visit in 2 weeks.   [name], RN, BSN, [employer] [facility] Care Management  [phone]

## 2016-07-10 ENCOUNTER — Telehealth: Payer: Self-pay | Admitting: *Deleted

## 2016-07-10 DIAGNOSIS — I110 Hypertensive heart disease with heart failure: Secondary | ICD-10-CM | POA: Diagnosis not present

## 2016-07-10 DIAGNOSIS — M1990 Unspecified osteoarthritis, unspecified site: Secondary | ICD-10-CM | POA: Diagnosis not present

## 2016-07-10 DIAGNOSIS — I872 Venous insufficiency (chronic) (peripheral): Secondary | ICD-10-CM | POA: Diagnosis not present

## 2016-07-10 DIAGNOSIS — F419 Anxiety disorder, unspecified: Secondary | ICD-10-CM | POA: Diagnosis not present

## 2016-07-10 DIAGNOSIS — G20 Parkinson's disease: Secondary | ICD-10-CM | POA: Diagnosis not present

## 2016-07-10 DIAGNOSIS — I5023 Acute on chronic systolic (congestive) heart failure: Secondary | ICD-10-CM | POA: Diagnosis not present

## 2016-07-10 NOTE — Progress Notes (Signed)
[name],   Thank you for working so hard with [name] on her medication adherence.  We will see what she has to say at her upcoming appt.  I hope she can continue to keep her medications centralized.  I wish someone could get her to use the pillbox method, but not sure who would fill them.  Her memory is pretty good, but she does have confusion sometimes so I'd prefer that she didn't do it herself. She would need that ongoing.   Thanks again!   TReed

## 2016-07-10 NOTE — Telephone Encounter (Signed)
Clair Gulling with [facility] called requesting verbal orders for OT. Verbal given. He also wanted to confirm patient's appointment for tomorrow.

## 2016-07-11 ENCOUNTER — Ambulatory Visit (INDEPENDENT_AMBULATORY_CARE_PROVIDER_SITE_OTHER): Payer: Medicare Other | Admitting: Internal Medicine

## 2016-07-11 ENCOUNTER — Encounter: Payer: Self-pay | Admitting: Internal Medicine

## 2016-07-11 VITALS — BP 128/80 | HR 74 | Temp 97.7°F | Ht 64.0 in | Wt 161.0 lb

## 2016-07-11 DIAGNOSIS — H353190 Nonexudative age-related macular degeneration, unspecified eye, stage unspecified: Secondary | ICD-10-CM | POA: Diagnosis not present

## 2016-07-11 DIAGNOSIS — I5023 Acute on chronic systolic (congestive) heart failure: Secondary | ICD-10-CM | POA: Diagnosis not present

## 2016-07-11 DIAGNOSIS — I10 Essential (primary) hypertension: Secondary | ICD-10-CM | POA: Diagnosis not present

## 2016-07-11 DIAGNOSIS — G20 Parkinson's disease: Secondary | ICD-10-CM | POA: Diagnosis not present

## 2016-07-11 DIAGNOSIS — D500 Iron deficiency anemia secondary to blood loss (chronic): Secondary | ICD-10-CM

## 2016-07-11 DIAGNOSIS — I872 Venous insufficiency (chronic) (peripheral): Secondary | ICD-10-CM | POA: Diagnosis not present

## 2016-07-11 DIAGNOSIS — G20A1 Parkinson's disease without dyskinesia, without mention of fluctuations: Secondary | ICD-10-CM

## 2016-07-11 NOTE — Patient Instructions (Signed)
The eye doctor recommended you get preservision AREDS2 vitamins to help protect your eyes from macular degeneration.    Use mucinex twice a day to help with your chest congestion.  If it does not improve after meals, let me know.

## 2016-07-11 NOTE — Progress Notes (Signed)
Location:  [facility] clinic Provider: Zyir Gassert L. Mariea Clonts, D.O., C.M.D.  Code Status: DNR Goals of Care:  Advanced Directives 07/11/2016  Does patient have an advance directive? Yes  Type of Advance Directive Out of facility DNR (pink MOST or yellow form)  Does patient want to make changes to advanced directive? -  Copy of advanced directive(s) in chart? Yes  Would patient like information on creating an advanced directive? -  Pre-existing out of facility DNR order (yellow form or pink MOST form) Pink MOST form placed in chart (order not valid for inpatient use)     Chief Complaint  Patient presents with  . Transitions Of Care    discharged from nursing facility    [name] seen today for hospital follow-up s/p admission from 9/18-9/20/17 followed by a nursing home rehab stay from 06/12/16-07/04/16 at [facility].    She was hospitalized with acute on chronic systolic heart failure and improved with diuresis and she was continued on lasix, 40mg  daily, lopressor 25mg  po bid and asa 81 mg daily.  Her bp remains controlled. She reports she passed out this last time.      Her anemia was stable at 10.3 on iron supplementation.  She remains on kenalog twice a day for her venous insufficiency.  She never uses more than 2 oxycodone for back pain per day.  Still has pain if she's in a sitting or standing position long.  Does not use it often.  She rests instead.    Doing well on her mirapex and sinemet from neurology for her PD.  I've been reading the [facility] social worker outreach notes and she has concerns about her medication mgt and nonadherence.  She reports she does keep her medications all in a dishpan.    Bowels are moving better with eating greens.  Drinking water.    Does report increased congestion and cough after eating.  Nose also runs more.  Denies any choking spells or difficulty getting foods to go down.    Saw a doctor at [facility], Dr. Yisroel Ramming, and she  had something "growing in the back of her right eye" that needs treatment in November. 1. 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 Nonexudative age-related macular degeneration, right eye, intermediate dry stage H35.3112  4. Nonexudative age-related macular degeneration, left eye, early dry stage H35.3121  5. Posterior vitreous detachment of both eyes H43.813  6. Myopia of both eyes H52.13    Past Medical History:  Diagnosis Date  . Acute on chronic systolic heart failure ([place])   . Anemia   . Anxiety   . Arthritis   . Cataract left  . Cellulitis and abscess of leg, except foot   . Cellulitis of lower leg 12/24/2011  . CHF (congestive heart failure) ([place])   . Chronic bronchitis ([place])    "usually get it q yr"  . Chronic systolic heart failure ([place])   . GERD (gastroesophageal reflux disease)   . Glaucoma left  . Helicobacter pylori (H. pylori)   . History of bleeding peptic ulcer   . History of blood transfusion    "don't remember why"  . Hypercholesterolemia   . Hypertension   . Hypotension, unspecified   . Hypothyroidism   . Parkinson's disease   . Pneumonia   . Sleep apnea    "had test years ago; insurance wouldn't  pay for mask so I never had one" (04/27/2015)  . Stroke [facility]) X 3   "that  was the reason I got Parkinson's"    Past Surgical History:  Procedure Laterality Date  . BACK SURGERY    . ESOPHAGOGASTRODUODENOSCOPY  09/26/2011   Procedure: ESOPHAGOGASTRODUODENOSCOPY (EGD);  Surgeon: Zenovia Jarred, MD;  Location: [facility] ENDOSCOPY;  Service: Gastroenterology;  Laterality: N/A;  To be done at bedside.  . ESOPHAGOGASTRODUODENOSCOPY N/A 09/30/2013   Procedure: ESOPHAGOGASTRODUODENOSCOPY (EGD);  Surgeon: [name], MD;  Location: [place];  Service: Endoscopy;  Laterality: N/A;  . ESOPHAGOGASTRODUODENOSCOPY N/A 11/25/2013   Procedure: ESOPHAGOGASTRODUODENOSCOPY (EGD);  Surgeon: [name], MD;  Location: [facility] ENDOSCOPY;  Service: Endoscopy;  Laterality: N/A;  . HEMIARTHROPLASTY HIP Right 09/24/2007   Archie Endo 01/23/2011  . KYPHOPLASTY  06/2009   T12/notes 07/17/2009  . NISSEN FUNDOPLICATION     "had OR for GERD"  . TENDON REPAIR     Gluteus medius [facility] Endo 01/23/2011  . THYROIDECTOMY    . TONSILLECTOMY      Allergies  Allergen Reactions  . Codeine Other (See Comments)    Unknown allergic reaction  . Penicillins Other (See Comments)    Has patient had a PCN reaction causing immediate rash, facial/tongue/throat swelling, SOB or lightheadedness with hypotension: No Has patient had a PCN reaction causing severe rash involving mucus membranes or skin necrosis: No Has patient had a PCN reaction that required hospitalization No Has patient had a PCN reaction occurring within the last 10 years: No If all of the above answers are "NO", then may proceed with Cephalosporin use. Pt passed out in doctor's office after penicillin dose      Medication List       Accurate as of 07/11/16 11:02 AM. Always use your most recent med list.          aspirin EC 81 MG tablet Take 81 mg by mouth daily.   carbidopa-levodopa 25-100 MG tablet Commonly known as:  SINEMET IR Take 1 at 6a, 1 1/2 pills at 9a, 1 at noon, 1 1/2 pills at 3p, 1 at 6pm, 1 1/2 pills at 9pm   docusate sodium 100 MG capsule Commonly known as:  COLACE Take 100 mg by mouth daily. Reported on 02/28/2016   ferrous sulfate 325 (65 FE) MG EC tablet Take 1 tablet (325 mg total) by mouth daily.   furosemide 40 MG tablet Commonly known as:  LASIX Take 1 tablet (40 mg total) by mouth daily.   latanoprost 0.005 % ophthalmic solution Commonly known as:  XALATAN Place 1 drop into the left eye daily at 6 PM.   LORazepam 0.5 MG tablet Commonly known as:  ATIVAN Take 1 tablet (0.5 mg total) by mouth 2 (two) times daily as needed for anxiety.   metoprolol tartrate 25 MG tablet Commonly known as:  LOPRESSOR Take 1  tablet (25 mg total) by mouth 2 (two) times daily.   multivitamin with minerals tablet Take 1 tablet by mouth daily.   nystatin cream Commonly known as:  MYCOSTATIN Apply 1 application topically 2 (two) times daily.   omeprazole 20 MG capsule Commonly known as:  PRILOSEC Take 20 mg by mouth daily.   oxyCODONE 5 MG immediate release tablet Commonly known as:  Oxy IR/ROXICODONE Take 1 tablet (5 mg total) by mouth 2 (two) times daily as needed for severe pain.   pramipexole 1 MG tablet Commonly known as:  MIRAPEX Take 1 tablet (1 mg total) by mouth 3 (  three) times daily.   SYSTANE OP Place 1 drop into both eyes daily.   triamcinolone cream 0.1 % Commonly known as:  KENALOG Apply 1 application topically 2 (two) times daily.   Vitamin D 2000 units Caps Take 2,000 Units by mouth daily. Reported on 04/10/2016       Review of Systems:  Review of Systems  Constitutional: Positive for weight loss. Negative for chills, fever and malaise/fatigue.  HENT: Negative for congestion.   Respiratory: Positive for cough. Negative for sputum production, shortness of breath and wheezing.   Cardiovascular: Negative for chest pain and palpitations.  Gastrointestinal: Negative for abdominal pain.  Genitourinary: Negative for dysuria.  Musculoskeletal: Positive for back pain. Negative for falls and joint pain.  Skin: Negative for itching and rash.  Neurological: Positive for tremors and loss of consciousness. Negative for weakness.  Psychiatric/Behavioral: Positive for memory loss. Negative for depression.    Health Maintenance  Topic Date Due  . TETANUS/TDAP  06/[birth year]/1959  . ZOSTAVAX  06/06/2000  . DEXA SCAN  06/06/2005  . MAMMOGRAM  11/22/2011  . INFLUENZA VACCINE  Completed  . PNA vac Low Risk Adult  Completed    Physical Exam: Vitals:   07/11/16 1040  BP: 128/80  Pulse: 74  Temp: 97.7 F (36.5 C)  TempSrc: Oral  SpO2: 97%  Weight: 161 lb (73 kg)  Height: 5\' 4"  (1.626 m)    Body mass index is 27.64 kg/m. Physical Exam  Constitutional: She is oriented to person, place, and time. She appears well-developed and well-nourished. No distress.  Cardiovascular: Normal rate, regular rhythm, normal heart sounds and intact distal pulses.   Pulmonary/Chest: Effort normal and breath sounds normal. No respiratory distress. She has no wheezes. She has no rales.  Abdominal: Soft. Bowel sounds are normal. She exhibits no distension. There is no tenderness.  Musculoskeletal:  Walks with stooped posture with walker, shuffling gait, tremor, speech more difficult to understand today and still had over an hour until her next sinemet was due  Neurological: She is alert and oriented to person, place, and time.  Skin: Skin is warm and dry.  Pink tone to lower legs, but no tenderness, swelling     Labs reviewed: Basic Metabolic Panel:  Recent Labs  05/25/16 1134  05/27/16 0316 05/29/16 0840 05/31/16 0325  06/10/16 0957 06/11/16 0419 06/12/16 0627  NA  --   < > 139 137 138  < > 143 143 141  K  --   < > 4.1 3.6 4.1  < > 3.9 4.0 3.6  CL  --   < > 102 100* 97*  < > 108 107 108  CO2  --   < > 32 25 29  < > 24 29 27   GLUCOSE  --   < > 93 158* 131*  < > 125* 103* 115*  BUN  --   < > 23* 28* 38*  < > 35* 20 18  CREATININE  --   < > 1.00 1.06* 1.15*  < > 0.96 0.84 0.83  CALCIUM  --   < > 9.1 8.8* 9.3  < > 9.5 8.7* 8.8*  MG  --   --  2.2 2.5* 2.6*  --   --   --   --   TSH 0.762  --   --   --   --   --   --   --   --   < > = values in this interval not displayed. Liver  Function Tests:  Recent Labs  06/06/16 0935 06/10/16 0957  AST 16 22  ALT 7 13*  ALKPHOS 66 63  BILITOT 0.5 0.5  PROT 6.4 7.5  ALBUMIN 3.7 3.6   No results for input(s): LIPASE, AMYLASE in the last 8760 hours. No results for input(s): AMMONIA in the last 8760 hours. CBC:  Recent Labs  03/11/16 1522  06/06/16 0935 06/10/16 0957 06/11/16 0419 06/12/16 0627  WBC 7.5  < > 11.6* 7.1 7.7 10.0   NEUTROABS 5.3  --  9,628* 4.9  --   --   HGB 10.1*  < > 10.5* 11.8* 10.0* 10.3*  HCT 33.8*  < > 32.2* 39.3 33.0* 34.2*  MCV 81.8  < > 73.5* 79.6 79.3 79.2  PLT 184  < > 167 190 192 183  < > = values in this interval not displayed. Lipid Panel: No results for input(s): CHOL, HDL, LDLCALC, TRIG, CHOLHDL, LDLDIRECT in the last 8760 hours. No results found for: HGBA1C  Procedures since last visit: Ct Angio Head W Or Wo Contrast  Result Date: 06/10/2016 CLINICAL DATA:  Altered mental status.  Found on the floor. EXAM: CT ANGIOGRAPHY HEAD TECHNIQUE: Multidetector CT imaging of the head was performed using the standard protocol during bolus administration of intravenous contrast. Multiplanar CT image reconstructions and MIPs were obtained to evaluate the vascular anatomy. CONTRAST:  50 cc Isovue 370 COMPARISON:  None. FINDINGS: CTA HEAD Anterior circulation: Internal carotid arteries are widely patent through the skullbase. Ordinary atherosclerotic calcification in the siphon regions but without stenosis greater than 30%. The anterior and middle cerebral vessels are patent without proximal stenosis, aneurysm or vascular malformation. Posterior circulation: Both vertebral arteries are patent through the foramen magnum, with the left being dominant. No basilar stenosis. Posterior circulation branch vessels are normal. Venous sinuses: Patent and normal Anatomic variants: None significant Delayed phase:No abnormal enhancement IMPRESSION: Negative CT angiography of the large and medium size vessels. Ordinary siphon atherosclerosis. Electronically Signed   By: Nelson Chimes M.D.   On: 06/10/2016 14:50   Dg Chest 2 View  Result Date: 06/10/2016 CLINICAL DATA:  Status post fall.  Right hip pain. EXAM: CHEST  2 VIEW COMPARISON:  06/01/2016 FINDINGS: There is mild bilateral interstitial thickening. There is no focal parenchymal opacity. There is no pleural effusion or pneumothorax. There is stable cardiomegaly. The  osseous structures are unremarkable. IMPRESSION: Cardiomegaly with mild pulmonary vascular congestion. Electronically Signed   By: Kathreen Devoid   On: 06/10/2016 10:43   Ct Head Wo Contrast  Result Date: 06/10/2016 CLINICAL DATA:  75 year old female status post fall from recliner this morning. Possible syncope. Initial encounter. EXAM: CT HEAD WITHOUT CONTRAST CT CERVICAL SPINE WITHOUT CONTRAST TECHNIQUE: Multidetector CT imaging of the head and cervical spine was performed following the standard protocol without intravenous contrast. Multiplanar CT image reconstructions of the cervical spine were also generated. COMPARISON:  Head and cervical spine CT 07/30/2013. FINDINGS: CT HEAD FINDINGS Brain: Confluent hypodensity in the left frontal operculum resembling a chronic cortically based infarct is new since 2014. No associated mass effect. Mild associated ex vacuo enlargement of the left lateral ventricle. No ventriculomegaly. Elsewhere gray-white matter differentiation is within normal limits. No midline shift, mass effect, or evidence of intracranial mass lesion. No acute intracranial hemorrhage identified. Vascular: Calcified atherosclerosis at the skull base. Skull: Stable and intact. Sinuses/Orbits: Visualized paranasal sinuses and mastoids are stable and well pneumatized. Other: No acute orbit or scalp soft tissue findings identified. Multiple calcified benign-appearing scalp  nodules re- demonstrated. CT CERVICAL SPINE FINDINGS Alignment: Stable cervical vertebral height and alignment with some straightening of lordosis and mild spondylolisthesis. Cervicothoracic junction alignment is within normal limits. Bilateral posterior element alignment is within normal limits. Skull base and vertebrae: Visualized skull base is intact. No atlanto-occipital dissociation. No acute cervical spine fracture identified. Grossly stable and intact visualized upper thoracic levels. Soft tissues and spinal canal: Chronic left  thyroid goiter with multiple nodules. Stable mild mass effect on the trachea at the thoracic inlet with no airway narrowing. Surgical clips from previous right thyroidectomy. Disc levels: Chronic mild degenerative spinal stenosis suspected at C5-C6. Upper chest: Stable mild apical scarring. Other: None. IMPRESSION: 1. 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 2. No acute intracranial abnormality. 3. No acute fracture or listhesis identified in the cervical spine. Ligamentous injury is not excluded. Chronic cervical spine degeneration appears stable. 4. 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 Electronically Signed   By: Genevie Ann M.D.   On: 06/10/2016 11:13   Ct Cervical Spine Wo Contrast  Result Date: 06/10/2016 CLINICAL DATA:  75 year old female status post fall from recliner this morning. Possible syncope. Initial encounter. EXAM: CT HEAD WITHOUT CONTRAST CT CERVICAL SPINE WITHOUT CONTRAST TECHNIQUE: Multidetector CT imaging of the head and cervical spine was performed following the standard protocol without intravenous contrast. Multiplanar CT image reconstructions of the cervical spine were also generated. COMPARISON:  Head and cervical spine CT 07/30/2013. FINDINGS: CT HEAD FINDINGS Brain: Confluent hypodensity in the left frontal operculum resembling a chronic cortically based infarct is new since 2014. No associated mass effect. Mild associated ex vacuo enlargement of the left lateral ventricle. No ventriculomegaly. Elsewhere gray-white matter differentiation is within normal limits. No midline shift, mass effect, or evidence of intracranial mass lesion. No acute intracranial hemorrhage identified. Vascular: Calcified atherosclerosis at the skull base. Skull: Stable and intact. Sinuses/Orbits: Visualized paranasal sinuses and mastoids are stable and well pneumatized. Other: No acute orbit or scalp soft tissue findings identified. Multiple calcified benign-appearing scalp  nodules re- demonstrated. CT CERVICAL SPINE FINDINGS Alignment: Stable cervical vertebral height and alignment with some straightening of lordosis and mild spondylolisthesis. Cervicothoracic junction alignment is within normal limits. Bilateral posterior element alignment is within normal limits. Skull base and vertebrae: Visualized skull base is intact. No atlanto-occipital dissociation. No acute cervical spine fracture identified. Grossly stable and intact visualized upper thoracic levels. Soft tissues and spinal canal: Chronic left thyroid goiter with multiple nodules. Stable mild mass effect on the trachea at the thoracic inlet with no airway narrowing. Surgical clips from previous right thyroidectomy. Disc levels: Chronic mild degenerative spinal stenosis suspected at C5-C6. Upper chest: Stable mild apical scarring. Other: None. IMPRESSION: 1. 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 2. No acute intracranial abnormality. 3. No acute fracture or listhesis identified in the cervical spine. Ligamentous injury is not excluded. Chronic cervical spine degeneration appears stable. 4. 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 Electronically Signed   By: Genevie Ann M.D.   On: 06/10/2016 11:13   Mr Jeri Cos [id number] Contrast  Result Date: 06/11/2016 CLINICAL DATA:  Initial evaluation for syncope. EXAM: MRI HEAD WITHOUT AND WITH CONTRAST TECHNIQUE: Multiplanar, multiecho pulse sequences of the brain and surrounding structures were obtained without and with intravenous contrast. CONTRAST:  32mL MULTIHANCE GADOBENATE DIMEGLUMINE 529 MG/ML IV SOLN COMPARISON:  Prior CTs from 06/10/2016. FINDINGS: Brain: Study degraded by motion artifact. Mild diffuse prominence of the CSF containing  spaces is compatible with generalized age-related cerebral atrophy. Mild patchy T2/FLAIR hyperintensity within the periventricular deep white matter both cerebral hemispheres present, nonspecific, but most likely  related to mild chronic microvascular ischemic changes. Focal encephalomalacia with gliosis involving the cortical gray matter of the anterior left frontal lobe compatible with remote infarct. Associated small amount of chronic hemosiderin staining present within this region. Probable additional small remote cortical infarct within the left parietal lobe (series 5, image 18). No abnormal foci of restricted diffusion to suggest acute or subacute ischemia. Gray-white matter differentiation maintained. No evidence for acute intracranial hemorrhage. No mass lesion, midline shift, or mass effect. No hydrocephalus. No extra-axial fluid collection. Major dural sinuses are grossly patent. No abnormal enhancement. 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 This is of doubtful clinical significance. Pituitary infundibulum midline. Vascular: Major intracranial vascular flow voids well preserved. Skull and upper cervical spine: Craniocervical junction normal. Upper cervical spine grossly unremarkable without significant stenosis or other acute abnormality. Bone marrow signal intensity within normal limits. Multiple soft tissue nodules within the scalp noted, likely sebaceous cyst. Sinuses/Orbits: Globes and orbits within normal limits. Scattered mucosal thickening within the paranasal sinuses. No air-fluid level to suggest active sinus infection. No mastoid effusion. Inner ear structures grossly normal. Other: No other significant finding. IMPRESSION: 1. No acute intracranial process identified. 2. Remote left frontal and smaller left parietal cortical infarcts. 3. Mild chronic microvascular ischemic disease. Electronically Signed   By: Jeannine Boga M.D.   On: 06/11/2016 04:18   Dg Hip Unilat W Or Wo Pelvis 2-3 Views Right  Result Date: 06/10/2016 CLINICAL DATA:  Fall today.  Right hip pain. EXAM: DG HIP  (WITH OR WITHOUT PELVIS) 2-3V RIGHT COMPARISON:  None. FINDINGS: A right hip hemiarthroplasty is noted. The hip is located. The femoral component is well seated. There is no associated fracture. The pelvis is otherwise intact. Moderate osteopenia is noted. Degenerative changes are present in the lower lumbar spine. IMPRESSION: 1. Right hip hemiarthroplasty without complication. 2. No acute fracture. 3. Moderate osteopenia. Electronically Signed   By: San Morelle M.D.   On: 06/10/2016 10:43    Assessment/Plan 1. Acute on chronic systolic congestive heart failure ([place]) -resolved, says she is taking her metoprolol as directed as well as her lasix and trying to follow a low sodium diet -wt remains down at 161 (had been 175 at hospital admission)  2. Essential hypertension -bp at goal with regimen, cont the same  3. Parkinson's disease ([place]) -cont sinemet and mirapex per neurology, continuous rollator walker use, caregiver assistance at home  4. Venous stasis dermatitis of both lower extremities -stable, no sign of infection, cont kenalog cream  5. Iron deficiency anemia due to chronic blood loss -cont iron supplement, bowels are moving   6. Nonexudative age-related macular degeneration -new findings during ophtho visit and new glasses -keep f/u in Nov at [facility] about this  Labs/tests ordered:  No orders of the defined types were placed in this encounter.  Next appt:  08/12/2016  Gaege Sangalang L. Jerilyn Gillaspie, D.O. [facility] [address], [place] [address] Cell Phone (Mon-Fri 8am-5pm):  [phone] On Call:  [phone] & follow prompts after 5pm & weekends Office Phone:  [phone] Office Fax:  [phone]

## 2016-07-12 ENCOUNTER — Telehealth: Payer: Self-pay | Admitting: *Deleted

## 2016-07-12 DIAGNOSIS — I872 Venous insufficiency (chronic) (peripheral): Secondary | ICD-10-CM | POA: Diagnosis not present

## 2016-07-12 DIAGNOSIS — I5023 Acute on chronic systolic (congestive) heart failure: Secondary | ICD-10-CM | POA: Diagnosis not present

## 2016-07-12 DIAGNOSIS — M1990 Unspecified osteoarthritis, unspecified site: Secondary | ICD-10-CM | POA: Diagnosis not present

## 2016-07-12 DIAGNOSIS — F419 Anxiety disorder, unspecified: Secondary | ICD-10-CM | POA: Diagnosis not present

## 2016-07-12 DIAGNOSIS — G20 Parkinson's disease: Secondary | ICD-10-CM | POA: Diagnosis not present

## 2016-07-12 DIAGNOSIS — I110 Hypertensive heart disease with heart failure: Secondary | ICD-10-CM | POA: Diagnosis not present

## 2016-07-12 NOTE — Telephone Encounter (Signed)
[name] with [name] called needing verbal orders for Skilled Nursing 1x9wks, PT&OT Eval and Tx, and Nurse Aid. Verbal orders given.

## 2016-07-15 ENCOUNTER — Encounter: Payer: Self-pay | Admitting: *Deleted

## 2016-07-15 ENCOUNTER — Other Ambulatory Visit: Payer: Self-pay | Admitting: *Deleted

## 2016-07-15 NOTE — Patient Outreach (Signed)
[facility]) Care Management [place] Telephone Outreach, Transition of Care day 7 Care Coordination telephone outreach 07/15/2016  [name] 1941/09/23 [id number]  Successful telephone outreach to [name] is an 75 y.o. female well known to [place] for multiple hospitalizations and safety concerns. [name] most recently admitted to the hospital September 18-20, 2017,after experiencing syncopal episode/ AMS at home. Patient was discharged to [name] for rehabiliation, and was subsequently discharged home from [facility] on July 05, 2016.[facility] CSW isalso actively [employer] care for [employer] evaluation.   Today, [name] reports that she attended follow PCP visit on Thursday, July 11, 2016, stating that her brother "[name]" took her to the appointment.  [name] reports that she was told by Dr. Mariea Clonts to take Mucinex for chest congestion, which she is doing, and states, "it is helping."  [name] reported that [name] services through [name] has begun, and states that OT visited her last week.  [name] reports that she did not rest well last night.  [name] continues to report that she has and is taking all prescribed medications, and I encouraged her to continue keeping her medications in one centralized location in her home, which she agreed to do.  We confirmed our next scheduled in-home visit next week, where we agreed to continue working on [facility] medication management.    I then placed a call to patient's brother "[name]" (on [facility] written consent) for care coordination and to provide update on [place] CM plan of care around [facility] medications management; [name] agreed to meet with [name] and I during our next scheduled [place] CM in-home visit on Monday July 22, 2016, for the purpose of discussion/ collaboration of Niurka's current medication management system.  Plan:  Deasiah will take her medications as prescribed and will attend all  scheduled provider appointments.  Esthela will continue actively working with [facility] services, [facility] RN CM, and [facility] CSW.  Torryn will notify her providers for any concerns, issues, problems, or questions that arise.  Continued [facility] CM outreach for ongoing transition of care with scheduled in-home visit nextweek.   [name], RN, BSN, [employer] [facility] Care Management  [phone]

## 2016-07-16 DIAGNOSIS — I872 Venous insufficiency (chronic) (peripheral): Secondary | ICD-10-CM | POA: Diagnosis not present

## 2016-07-16 DIAGNOSIS — M1990 Unspecified osteoarthritis, unspecified site: Secondary | ICD-10-CM | POA: Diagnosis not present

## 2016-07-16 DIAGNOSIS — I5023 Acute on chronic systolic (congestive) heart failure: Secondary | ICD-10-CM | POA: Diagnosis not present

## 2016-07-16 DIAGNOSIS — G20 Parkinson's disease: Secondary | ICD-10-CM | POA: Diagnosis not present

## 2016-07-16 DIAGNOSIS — I110 Hypertensive heart disease with heart failure: Secondary | ICD-10-CM | POA: Diagnosis not present

## 2016-07-16 DIAGNOSIS — F419 Anxiety disorder, unspecified: Secondary | ICD-10-CM | POA: Diagnosis not present

## 2016-07-17 DIAGNOSIS — I872 Venous insufficiency (chronic) (peripheral): Secondary | ICD-10-CM | POA: Diagnosis not present

## 2016-07-17 DIAGNOSIS — I110 Hypertensive heart disease with heart failure: Secondary | ICD-10-CM | POA: Diagnosis not present

## 2016-07-17 DIAGNOSIS — M1990 Unspecified osteoarthritis, unspecified site: Secondary | ICD-10-CM | POA: Diagnosis not present

## 2016-07-17 DIAGNOSIS — I5023 Acute on chronic systolic (congestive) heart failure: Secondary | ICD-10-CM | POA: Diagnosis not present

## 2016-07-17 DIAGNOSIS — G20 Parkinson's disease: Secondary | ICD-10-CM | POA: Diagnosis not present

## 2016-07-17 DIAGNOSIS — F419 Anxiety disorder, unspecified: Secondary | ICD-10-CM | POA: Diagnosis not present

## 2016-07-18 DIAGNOSIS — F419 Anxiety disorder, unspecified: Secondary | ICD-10-CM | POA: Diagnosis not present

## 2016-07-18 DIAGNOSIS — I5023 Acute on chronic systolic (congestive) heart failure: Secondary | ICD-10-CM | POA: Diagnosis not present

## 2016-07-18 DIAGNOSIS — I110 Hypertensive heart disease with heart failure: Secondary | ICD-10-CM | POA: Diagnosis not present

## 2016-07-18 DIAGNOSIS — G20 Parkinson's disease: Secondary | ICD-10-CM | POA: Diagnosis not present

## 2016-07-18 DIAGNOSIS — M1990 Unspecified osteoarthritis, unspecified site: Secondary | ICD-10-CM | POA: Diagnosis not present

## 2016-07-18 DIAGNOSIS — I872 Venous insufficiency (chronic) (peripheral): Secondary | ICD-10-CM | POA: Diagnosis not present

## 2016-07-19 DIAGNOSIS — I872 Venous insufficiency (chronic) (peripheral): Secondary | ICD-10-CM | POA: Diagnosis not present

## 2016-07-19 DIAGNOSIS — M1990 Unspecified osteoarthritis, unspecified site: Secondary | ICD-10-CM | POA: Diagnosis not present

## 2016-07-19 DIAGNOSIS — G20 Parkinson's disease: Secondary | ICD-10-CM | POA: Diagnosis not present

## 2016-07-19 DIAGNOSIS — I110 Hypertensive heart disease with heart failure: Secondary | ICD-10-CM | POA: Diagnosis not present

## 2016-07-19 DIAGNOSIS — F419 Anxiety disorder, unspecified: Secondary | ICD-10-CM | POA: Diagnosis not present

## 2016-07-19 DIAGNOSIS — I5023 Acute on chronic systolic (congestive) heart failure: Secondary | ICD-10-CM | POA: Diagnosis not present

## 2016-07-22 ENCOUNTER — Other Ambulatory Visit: Payer: Self-pay | Admitting: *Deleted

## 2016-07-22 ENCOUNTER — Encounter: Payer: Self-pay | Admitting: *Deleted

## 2016-07-22 DIAGNOSIS — M1990 Unspecified osteoarthritis, unspecified site: Secondary | ICD-10-CM | POA: Diagnosis not present

## 2016-07-22 DIAGNOSIS — G20 Parkinson's disease: Secondary | ICD-10-CM | POA: Diagnosis not present

## 2016-07-22 DIAGNOSIS — I110 Hypertensive heart disease with heart failure: Secondary | ICD-10-CM | POA: Diagnosis not present

## 2016-07-22 DIAGNOSIS — F419 Anxiety disorder, unspecified: Secondary | ICD-10-CM | POA: Diagnosis not present

## 2016-07-22 DIAGNOSIS — I5023 Acute on chronic systolic (congestive) heart failure: Secondary | ICD-10-CM | POA: Diagnosis not present

## 2016-07-22 DIAGNOSIS — I872 Venous insufficiency (chronic) (peripheral): Secondary | ICD-10-CM | POA: Diagnosis not present

## 2016-07-22 NOTE — Patient Outreach (Signed)
[name]) Care Management  [facility] CM Routine Home Visit, Transition of Care day 14 07/22/2016  [name] Apr 03, 1941 [id number]  [name] is an 75 y.o. female well known to [place] for multiple hospitalizations and safety concerns. [name] most recently admitted to the [name] September 18-20, 2017,after experiencing syncopal episode/ AMS at home. Patient was discharged to [name] for rehabiliation, and was subsequently discharged home from [name] on July 05, 2016.[facility] CSW isalso actively [employer] care for [employer] evaluation.   Today, Winda continues to report that she is "doing pretty good," but adds that she is "still a little weak and tired."    -- [name]'s personal care assistant "[name]" is present in home and works with [name] "5 days a week for 3 hours every day."   There has been visible improvement in the appearance of both [name] and her home environment from previous [place] in-home visits since [place] has been working with her.   -- [place] agency [name] actively working with patient; patient has [name] aide present this morning, "I just got a bath," and [place] OT Altamese Dilling) is present and working with patient.  Patient is able to ambulate around her home using a walker, and is able to perform all OT activities as requested.  Enda reports today ongoing support and assistance with her needs through her brother [name], who is present today for [place] CM in-home visit.   -- Ongoing medication safety/ adherence issues:  Although Haillee reports needing no assistance with medication management, which we had worked on extensively on during previous [place] home visits, several of her pill bottles remained scattered throughout her home again today and she again provided inconsistent reporting of medication management/ adherence.  The centralized medication bin that [facility] and I created together several months  ago, now has all kinds of items in it, including papers, scissors, batteries, skin lotions, etc, AND patient has her prescription medications scattered throughout her home.  One medication bottle was found underneath the chair leg of her recliner, and the lid was crushed and very difficult to open.  Jodelle, her brother [name], and I went over all of her medications, and I cleaned out the medication bin and stressed with both [name] and [name] the importance of keeping only her current prescription medications in this bin, with nothing else.  Syrina currently has a small (approximately 2-inch) pill box that she keeps only her daily Parkinson's medications in (Sinemet and Mirapex), because of the frequency of dosing.  Today, I instructed [name] and her brother [name] in filling a separate pill box for all other prescription medications.  Together, we filled a pill box for one week.  Patient's brother confirms that he will actively begin filling this ([name]) pill box for [facility], as she appears to be very compliant with her smaller pill box for Parkinson's medications.  Delani reports that [name] assists her with taking her vitamins and OTC supplements, which are in a separate wire box.  Keigan and her brother [name] report that they will use this [name] system for medication management, and that we will all meet together in 2 weeks to assess Charley's compliance/ [name] system's effectiveness.  During previous [place] in-home visit, I had placed [place] referral for medication adherence and self- management, and at that time, [name] did not wish to have blister packaging arranged or to use a pill box, and [name] continues to  state that she does not wish to use blister packaging for her medications.  Importance of understanding purpose of medications and medication adherence/ compliance was again encouraged with both patient and her brother today.   -- Self health management of Parkinson's Disease, CHF, HTN, LE  cellulitis:due to Wallace's severe state of Parkinson's disease, she is unable to weigh herself regularly, as she is not steady on her feet and requires the use of a rolling walker 100% of the time. Today, HH OT and I assisted [name] in obtaining her weight today, 167 lbs/ (patient's scales). Two weeks ago, her weight on her home scales was 152 lbs, and as there is no significant change in patient condition since then, I questions patient's scale calibration, as her scales are noticeably old/ outdated.  Patient does not wish to weigh herself daily, and we discussed signs/ symptoms that warrant patient taking action to call her provider/ 9-1-1.  Antara's bilateral LE continue to be slightly erythematous, but have no swelling noted. (see ROS/PE).  Trulie has continued using prescribed creams for lower extremity cellulitis.  We again discussed the need for [name] to contact her providers early if she develops concerns about her state of health, and she agreed to do so. [name] denied further questions, problems, issues, or concerns today, and confirms that she has my direct phone number, the main office number for [place] [name] CM, and the [facility] 24-hour nurse advice line phone number.  Subjective: "I'm still feeling good, but I'm still a little weak.  I got [name] glasses and I am going to see the foot doctor tomorrow."  Objective:    BP 116/72   Pulse 70   Resp 18   Wt 167 lb (75.8 kg)   SpO2 98%   BMI 28.67 kg/m    Review of Systems  Constitutional: Positive for malaise/fatigue.       Patient reports occasional weakness/ fatigue stating she has "some good days and some bad days."  Respiratory: Positive for shortness of breath. Negative for cough, sputum production and wheezing.        Patient becomes slightly short of breath with activity of working with [place] OT; SaO2 levels maintain 95-98% without dropping with activity of walking around her home with [facility] OT.  Patient appears to be at her baseline with SOB, and recovers  quickly (within 20 seconds) when resting in chair after activity cessation.  Cardiovascular: Negative for chest pain and leg swelling.       Bilateral lower legs are pink/ erythematous.  Patient just had a bath with assistance from [facility] aide, and her personal assistant has applied her leg creams.  No open areas of oozing noted; no LE swelling noted.   Gastrointestinal: Negative.  Negative for abdominal pain and nausea.  Genitourinary: Positive for frequency.       Occasional frequency reported "due to taking my lasix."  Musculoskeletal: Positive for myalgias. Negative for falls.  Skin:       Bilateral lower legs are pink/ erythematous.  Patient just had a bath with assistance from [facility] aide, and her personal assistant has applied her leg creams.  No open areas of oozing noted; no LE swelling noted.  Neurological: Positive for weakness. Negative for dizziness.  Psychiatric/Behavioral: Negative for depression. The patient is not nervous/anxious.     Physical Exam  Constitutional: She is oriented to person, place, and time. She appears well-developed and well-nourished. No distress.  Cardiovascular: Normal rate, regular rhythm, normal heart sounds and intact distal pulses.  Pulses:      Radial pulses are 2+ on the right side, and 2+ on the left side.       Dorsalis pedis pulses are 1+ on the right side, and 1+ on the left side.  Respiratory: Effort normal and breath sounds normal. No respiratory distress. She has no wheezes. She has no rales.  Base line shortness of breath noted with activity of walking around home (see ROS)  GI: Soft. Bowel sounds are normal.  Musculoskeletal: She exhibits no edema.  Neurological: She is alert and oriented to person, place, and time.  Skin: Skin is warm and dry. There is erythema.  See ROS  Psychiatric: She has a normal mood and affect. Her behavior is normal. Judgment and thought content normal.    Encounter Medications:   Outpatient Encounter  Prescriptions as of 07/22/2016  Medication Sig  . aspirin EC 81 MG tablet Take 81 mg by mouth daily.  . carbidopa-levodopa (SINEMET IR) 25-100 MG tablet Take 1 at 6a, 1 1/2 pills at 9a, 1 at noon, 1 1/2 pills at 3p, 1 at 6pm, 1 1/2 pills at 9pm (Patient taking differently: Take 1 at 6a, 1 1/2 pills at 9a, 1 at 1p, 1 1/2 pills at 3p, 1 at 6pm, 1 1/2 pills at 9pm)  . Cholecalciferol (VITAMIN D) 2000 units CAPS Take 2,000 Units by mouth daily. Reported on 04/10/2016  . docusate sodium (COLACE) 100 MG capsule Take 100 mg by mouth daily. Reported on 02/28/2016  . ferrous sulfate 325 (65 FE) MG EC tablet Take 1 tablet (325 mg total) by mouth daily.  . furosemide (LASIX) 40 MG tablet Take 1 tablet (40 mg total) by mouth daily.  Marland Kitchen latanoprost (XALATAN) 0.005 % ophthalmic solution Place 1 drop into the left eye daily at 6 PM.   . LORazepam (ATIVAN) 0.5 MG tablet Take 1 tablet (0.5 mg total) by mouth 2 (two) times daily as needed for anxiety.  . metoprolol tartrate (LOPRESSOR) 25 MG tablet Take 1 tablet (25 mg total) by mouth 2 (two) times daily.  . Multiple Vitamins-Minerals (MULTIVITAMIN WITH MINERALS) tablet Take 1 tablet by mouth daily.  Marland Kitchen nystatin cream (MYCOSTATIN) Apply 1 application topically 2 (two) times daily.  Marland Kitchen omeprazole (PRILOSEC) 20 MG capsule Take 20 mg by mouth daily.  Marland Kitchen oxyCODONE (OXY IR/ROXICODONE) 5 MG immediate release tablet Take 1 tablet (5 mg total) by mouth 2 (two) times daily as needed for severe pain.  Vladimir Faster Glycol-Propyl Glycol (SYSTANE OP) Place 1 drop into both eyes daily.   . pramipexole (MIRAPEX) 1 MG tablet Take 1 tablet (1 mg total) by mouth 3 (three) times daily.  Marland Kitchen triamcinolone cream (KENALOG) 0.1 % Apply 1 application topically 2 (two) times daily.   No facility-administered encounter medications on file as of 07/22/2016.     Functional Status:   In your present state of health, do you have any difficulty performing the following activities: 07/09/2016 05/27/2016   Hearing? N N  Vision? N N  Difficulty concentrating or making decisions? N N  Walking or climbing stairs? Y Y  Dressing or bathing? Y Y  Doing errands, shopping? [name]  Preparing Food and eating ? Y -  Using the Toilet? N -  In the past six months, have you accidently leaked urine? N -  Do you have problems with loss of bowel control? N -  Managing your Medications? N -  Managing your Finances? N -  Housekeeping or managing your Housekeeping? Y -  Some recent data might be hidden    Fall/Depression Screening:    PHQ 2/9 Scores 07/09/2016 06/06/2016 05/14/2016 03/29/2016 03/19/2016 02/21/2016 01/30/2016  PHQ - 2 Score 0 0 0 0 0 0 0    Assessment:  Safety issues with Tanna's medicationsthat were identified during [place] in-home visit continue to persist, despite previous efforts made to correct.  Faven verbalizes inconsistent reporting of her current medications since her discharge home from recent [name]/ [name] visit.  Sherylyn has a supportive brother who actively participates in meeting her care needs, and is willing to assist with keeping a weekly pill box filled with her prescription medications.  Rosielee is committed to keeping her [name] follow up provider appointments, and agrees to contact her providers promptly for any [name] concerns, issues, or problems that arise.  Plan:   Jovona will take her medications as prescribed and will attend all scheduled provider appointments.  Raheel and her brother [name] will begin using a pill box for all of Laurine's prescription medications, as instructed today.  Ruta will continue actively working with [facility] services, [facility] RN CM, and [name] CSW.  [employer] will notify her providers for any concerns, issues, problems, or questions that arise.  Continued [facility] Community CM telephone outreach for ongoing transition of care scheduled nextweek; [place] CM Routine home visit in 2 weeks.  [name], RN, BSN, [employer] [facility] Care Management  [phone]

## 2016-07-23 DIAGNOSIS — M1990 Unspecified osteoarthritis, unspecified site: Secondary | ICD-10-CM | POA: Diagnosis not present

## 2016-07-23 DIAGNOSIS — I110 Hypertensive heart disease with heart failure: Secondary | ICD-10-CM | POA: Diagnosis not present

## 2016-07-23 DIAGNOSIS — G20 Parkinson's disease: Secondary | ICD-10-CM | POA: Diagnosis not present

## 2016-07-23 DIAGNOSIS — I5023 Acute on chronic systolic (congestive) heart failure: Secondary | ICD-10-CM | POA: Diagnosis not present

## 2016-07-23 DIAGNOSIS — I872 Venous insufficiency (chronic) (peripheral): Secondary | ICD-10-CM | POA: Diagnosis not present

## 2016-07-23 DIAGNOSIS — F419 Anxiety disorder, unspecified: Secondary | ICD-10-CM | POA: Diagnosis not present

## 2016-07-24 ENCOUNTER — Ambulatory Visit (INDEPENDENT_AMBULATORY_CARE_PROVIDER_SITE_OTHER): Payer: Medicare Other | Admitting: Podiatry

## 2016-07-24 ENCOUNTER — Encounter: Payer: Self-pay | Admitting: Podiatry

## 2016-07-24 DIAGNOSIS — M79674 Pain in right toe(s): Secondary | ICD-10-CM

## 2016-07-24 DIAGNOSIS — M79675 Pain in left toe(s): Secondary | ICD-10-CM

## 2016-07-24 DIAGNOSIS — B351 Tinea unguium: Secondary | ICD-10-CM | POA: Diagnosis not present

## 2016-07-24 NOTE — Progress Notes (Signed)
   Subjective:    Patient ID: [name], female    DOB: [birth year], 75 y.o.   MRN: [id number]  HPI   I have to get my toenails trimmed and I have some red legs and not a diabetic and I have some parkinson's disease 1990 and some swelling and my feet get cold  Patient presents today complaining that her toenails are elongated and uncomfortable when wearing shoes and requesting toenail debridement. She denies any recent podiatric care. Also, patient states that she has some redness in her lower legs and currently she is quaking clindamycin 300 mg 3 times a day for the problem Patient has a history of Parkinson disease  Review of Systems  All other systems reviewed and are negative.      Objective:   Physical Exam  Patient's personal weight is present in the treatment room  Orientated 3  Vascular: DP and PT pulses 2/4 bilaterally Capillary reflex immediate bilaterally No calf edema or calf tenderness bilaterally  Neurological: Sensation to 10 g monofilament wire intact 4/5 bilaterally Vibratory sensation nonreactive bilaterally Ankle reflex equal reactive bilaterally  Dermatological Erythema pretibial lower legs bilaterally no warmth or active drainage noted in the lower legs bilaterally No active drainage noted from erythematous areas lower legs The toenails are elongated, deformed, discolored and tender to direct palpation 6-10  Musculoskeletal: Patient is able to transfer from wheelchair to treatment chair HAV bilaterally Dorsi flexion, plantar flexion 5/5 bilaterally        Assessment & Plan:   Assessment: Cellulitis lower extremities bilaterally under active treatment with clindamycin by outside physician 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

## 2016-07-24 NOTE — Patient Instructions (Signed)
Today your exam demonstrated adequate circulation in your feet You have slightly decreased feeling in your feet The inflamed skin on your lower legs is under treatment with a medication called clindamycin per patient The toenails are extremely deformed and thickened from fungal infection and were trim back today. Return every 3 months for toenail debridement

## 2016-07-25 DIAGNOSIS — G20 Parkinson's disease: Secondary | ICD-10-CM | POA: Diagnosis not present

## 2016-07-25 DIAGNOSIS — I110 Hypertensive heart disease with heart failure: Secondary | ICD-10-CM | POA: Diagnosis not present

## 2016-07-25 DIAGNOSIS — I5023 Acute on chronic systolic (congestive) heart failure: Secondary | ICD-10-CM | POA: Diagnosis not present

## 2016-07-25 DIAGNOSIS — I872 Venous insufficiency (chronic) (peripheral): Secondary | ICD-10-CM | POA: Diagnosis not present

## 2016-07-25 DIAGNOSIS — M1990 Unspecified osteoarthritis, unspecified site: Secondary | ICD-10-CM | POA: Diagnosis not present

## 2016-07-25 DIAGNOSIS — F419 Anxiety disorder, unspecified: Secondary | ICD-10-CM | POA: Diagnosis not present

## 2016-07-26 DIAGNOSIS — M1990 Unspecified osteoarthritis, unspecified site: Secondary | ICD-10-CM | POA: Diagnosis not present

## 2016-07-26 DIAGNOSIS — I110 Hypertensive heart disease with heart failure: Secondary | ICD-10-CM | POA: Diagnosis not present

## 2016-07-26 DIAGNOSIS — I5023 Acute on chronic systolic (congestive) heart failure: Secondary | ICD-10-CM | POA: Diagnosis not present

## 2016-07-26 DIAGNOSIS — G20 Parkinson's disease: Secondary | ICD-10-CM | POA: Diagnosis not present

## 2016-07-26 DIAGNOSIS — I872 Venous insufficiency (chronic) (peripheral): Secondary | ICD-10-CM | POA: Diagnosis not present

## 2016-07-26 DIAGNOSIS — F419 Anxiety disorder, unspecified: Secondary | ICD-10-CM | POA: Diagnosis not present

## 2016-07-29 DIAGNOSIS — I872 Venous insufficiency (chronic) (peripheral): Secondary | ICD-10-CM | POA: Diagnosis not present

## 2016-07-29 DIAGNOSIS — I110 Hypertensive heart disease with heart failure: Secondary | ICD-10-CM | POA: Diagnosis not present

## 2016-07-29 DIAGNOSIS — M1990 Unspecified osteoarthritis, unspecified site: Secondary | ICD-10-CM | POA: Diagnosis not present

## 2016-07-29 DIAGNOSIS — G20 Parkinson's disease: Secondary | ICD-10-CM | POA: Diagnosis not present

## 2016-07-29 DIAGNOSIS — F419 Anxiety disorder, unspecified: Secondary | ICD-10-CM | POA: Diagnosis not present

## 2016-07-29 DIAGNOSIS — I5023 Acute on chronic systolic (congestive) heart failure: Secondary | ICD-10-CM | POA: Diagnosis not present

## 2016-07-30 ENCOUNTER — Other Ambulatory Visit: Payer: Self-pay | Admitting: *Deleted

## 2016-07-30 ENCOUNTER — Encounter: Payer: Self-pay | Admitting: *Deleted

## 2016-07-30 DIAGNOSIS — M1990 Unspecified osteoarthritis, unspecified site: Secondary | ICD-10-CM | POA: Diagnosis not present

## 2016-07-30 DIAGNOSIS — I110 Hypertensive heart disease with heart failure: Secondary | ICD-10-CM | POA: Diagnosis not present

## 2016-07-30 DIAGNOSIS — G20 Parkinson's disease: Secondary | ICD-10-CM | POA: Diagnosis not present

## 2016-07-30 DIAGNOSIS — F419 Anxiety disorder, unspecified: Secondary | ICD-10-CM | POA: Diagnosis not present

## 2016-07-30 DIAGNOSIS — I5023 Acute on chronic systolic (congestive) heart failure: Secondary | ICD-10-CM | POA: Diagnosis not present

## 2016-07-30 DIAGNOSIS — I872 Venous insufficiency (chronic) (peripheral): Secondary | ICD-10-CM | POA: Diagnosis not present

## 2016-07-30 NOTE — Patient Outreach (Signed)
[facility]) Care Management [place] Telephone Outreach, Transition of Care day 22 07/30/2016  [name] [birth year] [id number]  [name] is an 75 y.o. female well known to [name] for multiple hospitalizations and safety concerns. [name] most recently admitted to the hospital September 18-20, 2017,after experiencing syncopal episode/ AMS at home. Patient was discharged to [name] for rehabiliation, and was subsequently discharged home from [facility] on July 05, 2016 with home health [facility]) services.[facility] CSW isalso actively [employer] care for [name] evaluation.   Today, [name] continues to report that she is "doing good," and she denies concerns, needs, problems or issues today.  -- Attended foot doctor appointment last week, "got toenails clipped." -- HH services remain active; patient reports active participation with [facility]. -- Personal care assistant [name] continues working with patient 5 days a week. -- Has continued keeping medications in centralized area in her home; reports brother/ caregiver [name] re-filled pill box last week.  Reports compliance with taking medications as prescribed.  States [name] process is "working fine."  We confirmed [name] in-home visit scheduled for next week.  Plan:   [name] take her medications as prescribed and will attend all scheduled provider appointments.  [name] and her brother [name] will begin using a pill box for all of [name]'s prescription medications, as instructed today.  [name] will continue actively working with [facility] services, [facility] RN CM, and[facility] CSW.  [name] will notify her providers for any concerns, issues, problems, or questions that arise.  Continued [facility] CM outreach for ongoing transition of care with scheduledhome visit nextweek.   [name] Rack, RN, BSN, [employer] [facility] Care Management  [phone] '

## 2016-07-31 DIAGNOSIS — F419 Anxiety disorder, unspecified: Secondary | ICD-10-CM | POA: Diagnosis not present

## 2016-07-31 DIAGNOSIS — M1990 Unspecified osteoarthritis, unspecified site: Secondary | ICD-10-CM | POA: Diagnosis not present

## 2016-07-31 DIAGNOSIS — I5023 Acute on chronic systolic (congestive) heart failure: Secondary | ICD-10-CM | POA: Diagnosis not present

## 2016-07-31 DIAGNOSIS — I110 Hypertensive heart disease with heart failure: Secondary | ICD-10-CM | POA: Diagnosis not present

## 2016-07-31 DIAGNOSIS — G20 Parkinson's disease: Secondary | ICD-10-CM | POA: Diagnosis not present

## 2016-07-31 DIAGNOSIS — I872 Venous insufficiency (chronic) (peripheral): Secondary | ICD-10-CM | POA: Diagnosis not present

## 2016-08-01 ENCOUNTER — Other Ambulatory Visit: Payer: Self-pay | Admitting: Licensed Clinical Social Worker

## 2016-08-01 DIAGNOSIS — M1990 Unspecified osteoarthritis, unspecified site: Secondary | ICD-10-CM | POA: Diagnosis not present

## 2016-08-01 DIAGNOSIS — F419 Anxiety disorder, unspecified: Secondary | ICD-10-CM | POA: Diagnosis not present

## 2016-08-01 DIAGNOSIS — I5023 Acute on chronic systolic (congestive) heart failure: Secondary | ICD-10-CM | POA: Diagnosis not present

## 2016-08-01 DIAGNOSIS — I872 Venous insufficiency (chronic) (peripheral): Secondary | ICD-10-CM | POA: Diagnosis not present

## 2016-08-01 DIAGNOSIS — I110 Hypertensive heart disease with heart failure: Secondary | ICD-10-CM | POA: Diagnosis not present

## 2016-08-01 DIAGNOSIS — G20 Parkinson's disease: Secondary | ICD-10-CM | POA: Diagnosis not present

## 2016-08-01 NOTE — Patient Outreach (Signed)
[facility]) Care Management  08/01/2016  [name] 02-08-1941 [id number]  Assessment- CSW completed outreach call to patient. Phone continuously rang and CSW was unable to leave a voice message. Patient may be on phone.   Plan-CSW will make additional outreach within one week.  [name], BSW, MSW, [email or web address] Phone: [phone] Fax: [phone]

## 2016-08-02 DIAGNOSIS — G20 Parkinson's disease: Secondary | ICD-10-CM | POA: Diagnosis not present

## 2016-08-02 DIAGNOSIS — I5023 Acute on chronic systolic (congestive) heart failure: Secondary | ICD-10-CM | POA: Diagnosis not present

## 2016-08-02 DIAGNOSIS — F419 Anxiety disorder, unspecified: Secondary | ICD-10-CM | POA: Diagnosis not present

## 2016-08-02 DIAGNOSIS — I872 Venous insufficiency (chronic) (peripheral): Secondary | ICD-10-CM | POA: Diagnosis not present

## 2016-08-02 DIAGNOSIS — M1990 Unspecified osteoarthritis, unspecified site: Secondary | ICD-10-CM | POA: Diagnosis not present

## 2016-08-02 DIAGNOSIS — I110 Hypertensive heart disease with heart failure: Secondary | ICD-10-CM | POA: Diagnosis not present

## 2016-08-05 DIAGNOSIS — I5023 Acute on chronic systolic (congestive) heart failure: Secondary | ICD-10-CM | POA: Diagnosis not present

## 2016-08-05 DIAGNOSIS — F419 Anxiety disorder, unspecified: Secondary | ICD-10-CM | POA: Diagnosis not present

## 2016-08-05 DIAGNOSIS — I110 Hypertensive heart disease with heart failure: Secondary | ICD-10-CM | POA: Diagnosis not present

## 2016-08-05 DIAGNOSIS — G20 Parkinson's disease: Secondary | ICD-10-CM | POA: Diagnosis not present

## 2016-08-05 DIAGNOSIS — I872 Venous insufficiency (chronic) (peripheral): Secondary | ICD-10-CM | POA: Diagnosis not present

## 2016-08-05 DIAGNOSIS — M1990 Unspecified osteoarthritis, unspecified site: Secondary | ICD-10-CM | POA: Diagnosis not present

## 2016-08-06 ENCOUNTER — Encounter: Payer: Self-pay | Admitting: *Deleted

## 2016-08-06 ENCOUNTER — Other Ambulatory Visit: Payer: Self-pay | Admitting: *Deleted

## 2016-08-06 VITALS — BP 110/84 | HR 74 | Resp 24

## 2016-08-06 DIAGNOSIS — I110 Hypertensive heart disease with heart failure: Secondary | ICD-10-CM | POA: Diagnosis not present

## 2016-08-06 DIAGNOSIS — I5040 Unspecified combined systolic (congestive) and diastolic (congestive) heart failure: Secondary | ICD-10-CM

## 2016-08-06 DIAGNOSIS — M1990 Unspecified osteoarthritis, unspecified site: Secondary | ICD-10-CM | POA: Diagnosis not present

## 2016-08-06 DIAGNOSIS — G20 Parkinson's disease: Secondary | ICD-10-CM | POA: Diagnosis not present

## 2016-08-06 DIAGNOSIS — I5023 Acute on chronic systolic (congestive) heart failure: Secondary | ICD-10-CM | POA: Diagnosis not present

## 2016-08-06 DIAGNOSIS — I872 Venous insufficiency (chronic) (peripheral): Secondary | ICD-10-CM | POA: Diagnosis not present

## 2016-08-06 DIAGNOSIS — F419 Anxiety disorder, unspecified: Secondary | ICD-10-CM | POA: Diagnosis not present

## 2016-08-06 NOTE — Patient Outreach (Signed)
[facility]) Care Management  [facility] Community CM Routine Home Visit, Transition of Care day 29 08/06/2016  [name] 1940/11/09 [id number]  [name] is an 75 y.o. female well known to [place] for multiple hospitalizations and safety concerns.  [name] was most recently admitted to the hospital September 18-20, 2017, after experiencing syncopal episode/ AMS at home.  Patient was discharged to [name] for rehabiliation, and was subsequently discharged home from [facility] on July 05, 2016 with home health [facility]) services. [facility] CSW is also actively involved in [facility] care for community resource evaluation.  Patient, her personal care assistant, and [place] student present today for home visit.   Today, [name] continues to report that she is "doing just fine and feeling good," and she denies concerns, needs, problems or issues.   -- [place] services remain active with [name] agency; patient reports active participation with [facility] services of RN, PT, bath aide.  [employer] OT services have now ended.  -- Personal care assistant [name] continues working with patient 5 days a week during morning hours.  -- Has continued keeping medications in centralized area in her home; reports brother/ caregiver [name] re-filled pill box 2 weeks ago, but states that [place] (Optician, dispensing) has assisted over the last week, as brother [name] was unavailable due to family obligations of his own.  Reports compliance with taking medications as prescribed, however upon review of patient's pill box there are discrepancies noted against ordered medications.  Together, we again reviewed all of [name]'s medications and I refilled her pill boxes for the next 2 weeks.  I discussed with patient that proper medication management is a very important part of self-health management, and we discussed various options available to patient such as blister packaging.  [name] is now agreeable to my placing another [place]  referral (previously placed earlier this year, but at that time patient did not wish to consider blister packaging).  Patient has continued keeping sinemet and mirapex in a smaller pill box that she carries with her, due to frequency of scheduling.  Specific details of pill box inaccuracy noted today:   1) Lopressor 25 mg po was taking 1/2 QD; order reads for 25 mg BID; re-educated patient on dosing, corrected pill box 2) No Lasix noted in pill box; patient states that she is taking Lasix as ordered, directly from bottle, stating that she doesn't want Lasix in pill box because she takes it earlier than her other morning medications.  -- Upcoming provider appointments:  Reviewed upcoming provider appointments with patient, and she was unaware of upcoming PCP appointment on Monday 08/12/16.  I assisted patient with making phone calls to secure transportation to that appointment, and transportation was successfully scheduled before in-home visit ended.  -- self-health management of chronic disease state of CHF:  Patient appears to be stable; bilaterally, legs are swollen at +1 and are slightly pink in color.  Patient is unable to complete daily weights due to difficulty of standing without use of her walker to obtain an accurate weight.  Patient is in no distress today, and her shortness of breath with activity appears to be at her baseline from previous [facility] CM in-home visits. See ROS/ PE.   Subjective: "I am doing pretty good.  I wouldn't mind talking to the pharmacist about the blister packs.  I don't want to go back to the hospital if I can help it."  Objective:    BP 110/84  Pulse 74   Resp (!) 24   SpO2 97%    Review of Systems  Constitutional: Negative.   Respiratory: Positive for shortness of breath. Negative for cough and wheezing.        SOB with activity, consistent with patient's baseline from previous [place] in-home visits; patient recovers quickly with rest   Cardiovascular: Positive for leg swelling. Negative for chest pain.       +1 bilateral LE edema noted; this is consistent with patient's baseline from previous [place] in-home visits  Gastrointestinal: Negative.  Negative for abdominal pain, nausea and vomiting.  Genitourinary: Positive for frequency.       Patient attributes to diuretic use  Musculoskeletal: Negative for falls and myalgias.  Neurological: Negative.   Psychiatric/Behavioral: Negative for depression. The patient is not nervous/anxious.     Physical Exam  Constitutional: She is oriented to person, place, and time. She appears well-developed and well-nourished. No distress.  Cardiovascular: Normal rate, regular rhythm, normal heart sounds and intact distal pulses.   Respiratory: Effort normal. No respiratory distress. She has no wheezes. She has no rales.  GI: Soft. Bowel sounds are normal.  Musculoskeletal: She exhibits edema.  See ROS  Neurological: She is alert and oriented to person, place, and time.  Skin: Skin is warm and dry. There is erythema.  Bilateral LE are pink in color, consistent with patient's baseline from previous [place] in-home visits   Psychiatric: She has a normal mood and affect. Her behavior is normal. Judgment and thought content normal.    Encounter Medications:   Outpatient Encounter Prescriptions as of 08/06/2016  Medication Sig  . aspirin EC 81 MG tablet Take 81 mg by mouth daily.  . carbidopa-levodopa (SINEMET IR) 25-100 MG tablet Take 1 at 6a, 1 1/2 pills at 9a, 1 at noon, 1 1/2 pills at 3p, 1 at 6pm, 1 1/2 pills at 9pm (Patient taking differently: Take 1 at 6a, 1 1/2 pills at 9a, 1 at 1p, 1 1/2 pills at 3p, 1 at 6pm, 1 1/2 pills at 9pm)  . Cholecalciferol (VITAMIN D) 2000 units CAPS Take 2,000 Units by mouth daily. Reported on 04/10/2016  . docusate sodium (COLACE) 100 MG capsule Take 100 mg by mouth daily. Reported on 02/28/2016  . ferrous sulfate 325 (65 FE) MG EC tablet  Take 1 tablet (325 mg total) by mouth daily.  . furosemide (LASIX) 40 MG tablet Take 1 tablet (40 mg total) by mouth daily.  Marland Kitchen latanoprost (XALATAN) 0.005 % ophthalmic solution Place 1 drop into the left eye daily at 6 PM.   . LORazepam (ATIVAN) 0.5 MG tablet Take 1 tablet (0.5 mg total) by mouth 2 (two) times daily as needed for anxiety.  . metoprolol tartrate (LOPRESSOR) 25 MG tablet Take 1 tablet (25 mg total) by mouth 2 (two) times daily.  . Multiple Vitamins-Minerals (MULTIVITAMIN WITH MINERALS) tablet Take 1 tablet by mouth daily.  Marland Kitchen nystatin cream (MYCOSTATIN) Apply 1 application topically 2 (two) times daily.  Marland Kitchen omeprazole (PRILOSEC) 20 MG capsule Take 20 mg by mouth daily.  Marland Kitchen oxyCODONE (OXY IR/ROXICODONE) 5 MG immediate release tablet Take 1 tablet (5 mg total) by mouth 2 (two) times daily as needed for severe pain.  Vladimir Faster Glycol-Propyl Glycol (SYSTANE OP) Place 1 drop into both eyes daily.   . pramipexole (MIRAPEX) 1 MG tablet Take 1 tablet (1 mg total) by mouth 3 (three) times daily.  Marland Kitchen triamcinolone cream (KENALOG) 0.1 % Apply  1 application topically 2 (two) times daily.   No facility-administered encounter medications on file as of 08/06/2016.     Functional Status:   In your present state of health, do you have any difficulty performing the following activities: 07/09/2016 05/27/2016  Hearing? N N  Vision? N N  Difficulty concentrating or making decisions? N N  Walking or climbing stairs? Y Y  Dressing or bathing? Y Y  Doing errands, shopping? [name]  Preparing Food and eating ? Y -  Using the Toilet? N -  In the past six months, have you accidently leaked urine? N -  Do you have problems with loss of bowel control? N -  Managing your Medications? N -  Managing your Finances? N -  Housekeeping or managing your Housekeeping? Y -  Some recent data might be hidden    Fall/Depression Screening:    PHQ 2/9 Scores 07/09/2016 06/06/2016 05/14/2016 03/29/2016 03/19/2016 02/21/2016  01/30/2016  PHQ - 2 Score 0 0 0 0 0 0 0    Assessment:  Safety issues with Jakaya's medicationsthat were identified during [place] in-home visit continue to persist, despite previous efforts made to correct.  [name] verbalizes inconsistent reporting of her current medications since her discharge home from recent hospital/ [name] visit.  [name] has a supportive brother who actively participates in meeting her care needs, but is not always available to oversee her medication management on a consistent basis.  [name] is agreeable to my placing another referral to [place] to explore options for medication management, such as blister packaging.  [name] is committed to keeping her hospital follow up provider appointments, and agrees to contact her providers promptly for any [name] concerns, issues, or problems that arise.  Plan:   [name] will take her medications as prescribed and will attend all scheduled provider appointments.   [name] will continue using pill boxes for all of Syl[place]'s prescription medications, for next 2 weeks.   [name] will continue actively working with [facility] services, [facility] RN CM, and [facility] CSW.   [name] will notify her providers for any concerns, issues, problems, or questions that arise.  I will make [place] referral for medication management options such as blister packaging of medications.   Continued [facility] Community CM outreach for ongoing transition of care with scheduled telephone call next week and in-home visit later this month.   [name], RN, BSN, [employer] [facility] Care Management  [phone]

## 2016-08-07 ENCOUNTER — Other Ambulatory Visit: Payer: Self-pay | Admitting: Licensed Clinical Social Worker

## 2016-08-07 NOTE — Patient Outreach (Signed)
[facility]) Care Management  08/07/2016  [name] [birth year] [id number]   Assessment- CSW spoke with [place] and discussed patient's care. CSW completed outreach call to patient. Patient answered. Patient is agreeable to home visit tomorrow.  Plan-CSW will complete home visit tomorrow and continue to provide social work assistance.  [name], BSW, MSW, [email or web address] Phone: [phone] Fax: [phone]

## 2016-08-08 ENCOUNTER — Other Ambulatory Visit: Payer: Self-pay | Admitting: Licensed Clinical Social Worker

## 2016-08-08 ENCOUNTER — Telehealth: Payer: Self-pay | Admitting: *Deleted

## 2016-08-08 DIAGNOSIS — F419 Anxiety disorder, unspecified: Secondary | ICD-10-CM | POA: Diagnosis not present

## 2016-08-08 DIAGNOSIS — M1990 Unspecified osteoarthritis, unspecified site: Secondary | ICD-10-CM | POA: Diagnosis not present

## 2016-08-08 DIAGNOSIS — I872 Venous insufficiency (chronic) (peripheral): Secondary | ICD-10-CM | POA: Diagnosis not present

## 2016-08-08 DIAGNOSIS — I110 Hypertensive heart disease with heart failure: Secondary | ICD-10-CM | POA: Diagnosis not present

## 2016-08-08 DIAGNOSIS — I5023 Acute on chronic systolic (congestive) heart failure: Secondary | ICD-10-CM | POA: Diagnosis not present

## 2016-08-08 DIAGNOSIS — G20 Parkinson's disease: Secondary | ICD-10-CM | POA: Diagnosis not present

## 2016-08-08 NOTE — Patient Outreach (Signed)
[facility]) Care Management  [facility] Social Work  08/08/2016  EMMILYNN [name] 1941-01-31 [id number]  Encounter Medications:  Outpatient Encounter Prescriptions as of 08/08/2016  Medication Sig Note  . aspirin EC 81 MG tablet Take 81 mg by mouth daily.   . carbidopa-levodopa (SINEMET IR) 25-100 MG tablet Take 1 at 6a, 1 1/2 pills at 9a, 1 at noon, 1 1/2 pills at 3p, 1 at 6pm, 1 1/2 pills at 9pm (Patient taking differently: Take 1 at 6a, 1 1/2 pills at 9a, 1 at 1p, 1 1/2 pills at 3p, 1 at 6pm, 1 1/2 pills at 9pm)   . Cholecalciferol (VITAMIN D) 2000 units CAPS Take 2,000 Units by mouth daily. Reported on 04/10/2016   . docusate sodium (COLACE) 100 MG capsule Take 100 mg by mouth daily. Reported on 02/28/2016   . ferrous sulfate 325 (65 FE) MG EC tablet Take 1 tablet (325 mg total) by mouth daily.   . furosemide (LASIX) 40 MG tablet Take 1 tablet (40 mg total) by mouth daily.   Marland Kitchen latanoprost (XALATAN) 0.005 % ophthalmic solution Place 1 drop into the left eye daily at 6 PM.    . LORazepam (ATIVAN) 0.5 MG tablet Take 1 tablet (0.5 mg total) by mouth 2 (two) times daily as needed for anxiety. 08/06/2016: Reports takes rarely  . metoprolol tartrate (LOPRESSOR) 25 MG tablet Take 1 tablet (25 mg total) by mouth 2 (two) times daily.   . Multiple Vitamins-Minerals (MULTIVITAMIN WITH MINERALS) tablet Take 1 tablet by mouth daily.   Marland Kitchen nystatin cream (MYCOSTATIN) Apply 1 application topically 2 (two) times daily.   Marland Kitchen omeprazole (PRILOSEC) 20 MG capsule Take 20 mg by mouth daily. 08/06/2016: Patient reports that she is not taking thjis medication; states does not need; "stomach fine."  . oxyCODONE (OXY IR/ROXICODONE) 5 MG immediate release tablet Take 1 tablet (5 mg total) by mouth 2 (two) times daily as needed for severe pain. 08/06/2016: Reports taking rarely  . Polyethyl Glycol-Propyl Glycol (SYSTANE OP) Place 1 drop into both eyes daily.    . pramipexole (MIRAPEX) 1 MG tablet Take 1 tablet  (1 mg total) by mouth 3 (three) times daily.   Marland Kitchen triamcinolone cream (KENALOG) 0.1 % Apply 1 application topically 2 (two) times daily.    No facility-administered encounter medications on file as of 08/08/2016.     Functional Status:  In your present state of health, do you have any difficulty performing the following activities: 07/09/2016 05/27/2016  Hearing? N N  Vision? N N  Difficulty concentrating or making decisions? N N  Walking or climbing stairs? Y Y  Dressing or bathing? Y Y  Doing errands, shopping? [name]  Preparing Food and eating ? Y -  Using the Toilet? N -  In the past six months, have you accidently leaked urine? N -  Do you have problems with loss of bowel control? N -  Managing your Medications? N -  Managing your Finances? N -  Housekeeping or managing your Housekeeping? Y -  Some recent data might be hidden    Fall/Depression Screening:  PHQ 2/9 Scores 07/09/2016 06/06/2016 05/14/2016 03/29/2016 03/19/2016 02/21/2016 01/30/2016  PHQ - 2 Score 0 0 0 0 0 0 0    Assessment: CSW completed home visit with patient on 08/08/16. Patient and patient's aide were present during visit and patient's Nectar came later during visit. Patient has an eye appointment in [facility] and needs assistance with transportation. Patient has  [facility] but they are unable to transport her to [facility]. Patient contacted [facility] and confirmed eye appointment on 08/20/16 at 8:30 with Dr. Yisroel Ramming. CSW wrote down directions on how to get to office once in the hospital. Patient wishes to use the [facility] bus but CSW is very concerned about this as she will have two different transfers that will be difficult on her.  Patient's aide [name] wishes for CSW to contact [facility]. CSW contacted [facility] and spoke to representative that stated that she would have to go to [facility], then pay for [facility] Bus which will take her to [facility] and then she would need to get on [place] to get to [place]. CSW questioned if aide or family member could transport her and aide is agreeable to doing if patient is willing to pay. Patient is willing to pay for this assistance and aide will take her to appointment.   Patient's legs are swollen and red today. Patient seems to be having difficulty breathing but [place] took oxygen and it was at 98 percent. Aide states that patient has been doing well but that patient starts having Parkinson reactions about 15 minutes to 25 minutes before taking her Parkinson medication and she feels she should take it sooner. CSW encouraged patient to discuss this with her PCP on 08/12/16. Patient reports that she will use [facility] to this appointment. CSW reviewed other upcoming appointments. CSW provided 8 Ensure coupons as patient is interested in using supplements. [facility] is receiving [facility] with [name] and has PT, Therapist, sports and [employer] involved. Patient's bath aide services will end in the middle of December. Patient's OT ended but they will re evaluate and probably continue services up again according to [facility] RN. Patient reports that her brother continues to bring her dinner every evening. She continues to receive [employer]. She shares that she has started sewing again and is making a pair of pants. She has not implemented any socialization activities but is implementing appropriate coping methods by sewing and playing music. Patient went to her foot doctor a few weeks ago and had toe nails clipped. Patient is still using a small pill box for her Parkinson medications. [facility] RN gave her a new pill box that is very colorful that patient is interested in using. Patient states that her aide [name] is now filing her pill box. Patient's blood pressure was good today according to [facility] RN.   Plan: CSW will make outreach within 30 days and continue to provide social work support. Encounter will be routed to PCP.   [facility] CM Care Plan Problem One    Flowsheet Row Most Recent Value  Care Plan Problem One  Need for ongoing support in the home since [name] admission  Role Documenting the Problem One  Clinical Social Worker  Care Plan for Problem One  Active  THN Long Term Goal (31-90 days)  Patient will utilize her aide, support network, community resources and coping methods within 90 days to improve overall health and well being  [facility] Long Term Goal Start Date  08/08/16  Interventions for Problem One Long Term Goal  CSW will review community resources during monthly outreaches. CSW will coordinate care with caregiver and family. CSW will educate her on appropriate coping tools as needed and monitor progress towards goal.  THN CM Short Term Goal #1 (0-30 days)  Over the next 30 days, patient will attend all scheduled medical appointments  and have stable transportation to each visit  THN CM Short Term Goal #1 Start Date  08/08/16  Interventions for Short Term Goal #1  Patient has upcoming eye appointment in [facility] and [place] assisted her with education on [facility] transportation and coordinating transportation arrangements with caregiver. Patient will be educated on how to make arrangements with [facility] if needed and will be educated on other transportation resources. CSW will encourage patient to use [facility] Calendar to keep up with appointments.    [facility] CM Care Plan Problem Three   Flowsheet Row Most Recent Value  Care Plan for Problem Three  Active  THN Long Term Goal (31-90) days  Patient will implement healthy coping tools into her routine within 90 days as evidenced by her isolation and need for fulfilment.  THN Long Term Goal Start Date  05/14/16  [facility] Long Term Goal Met Date  08/08/16  Interventions for Problem Three Long Term Goal  Goal met. Patient made new friends while in [name]  [facility] CM Short Term Goal #1 (0-30 days)  Patient will have a safe and stable discharge back home from [name] within 30 days due to recent [name] admission at [place] #1 Start Date  06/13/16  [facility] CM Short Term Goal #1 Met Date  07/09/16  Interventions for Short Term Goal #1  Patient had a safe and stable discharge . They recommended bedside commode but she already has one. Home Health  is going to start soon. Goal met.,  THN CM Short Term Goal #2 (0-30 days)  Patient will continue to clean her home to allow her more mobility in her home as evidenced by her hoarding and need for more room in her home to prevent falls and danger  THN CM Short Term Goal #2 Start Date  05/14/16  [facility] CM Short Term Goal #2 Met Date  06/13/16  Interventions for Short Term Goal #2  CSW will provide emotional support and will encourage her to clean home as needed. Patient has a new caregiver who has already assisted her with cleaning her home but patient wishes to do more within the next 30 days.  THN CM Short Term Goal #3 (0-30 days)  Patient will continue to gain aide services for the next 30 days as evidenced by her need for support since [name] discharge  THN  CM Short Term Goal #3 Start Date  07/09/16  [facility] CM Short Term Goal #3 Met Date  08/08/16  Interventions for Short Term Goal #3  Goal met.      [name], BSW, MSW, [email or web address] Phone: [phone] Fax: [phone]

## 2016-08-08 NOTE — Telephone Encounter (Signed)
Clair Gulling with [name] called and requested verbal orders to continue OT for patient. Verbal orders given.

## 2016-08-09 ENCOUNTER — Encounter: Payer: Self-pay | Admitting: *Deleted

## 2016-08-09 ENCOUNTER — Other Ambulatory Visit: Payer: Self-pay | Admitting: *Deleted

## 2016-08-09 NOTE — Patient Outreach (Signed)
[facility]) Care Management [place] Telephone [place], Transition of care day 32 08/09/2016  FALLYN MUNNERLYN 12/23/1940 [id number]  Successful telephone outreach to Bartholome Bill y.o. female well known to [place] CMfor multiple hospitalizations and safety concerns. [name] most recently admitted to the hospital September 18-20, 2017,after experiencing syncopal episode/ AMS at home. Patient was discharged to [name] for rehabiliation, and was subsequently discharged home from [facility] on July 05, 2016 with home health [facility]) services.THN CSW isalso actively [employer] care for [employer] evaluation.  [name].  Today, Azharia continues toreport that she is "doing just fine and feeling good," and she denies concerns, needs, problems or issues.  Patient states that she has been taking medications as prescribed, using pill box I filled for her earlier this week.  Confirmed with patient that she had secured transportation to provider appointments on Monday 08/12/16 and 08/13/16.  Nikol verbalizes plans to attend upcoming provider appointments.  I acknowledged with [place] that she had met goal to not experience hospital re-admission, and confirmed my in-home visit appointment with her scheduled for 08/21/16.  I let Briseida know that I had placed referral for [facility] pharmacist to explore option of blister packaging of her medications, and that pharmacist may be joining me for in-home visit on 08/21/16, and patient expressed understanding and agreement with this plan.   Plan:   [name] take her medications as prescribed using pill box that I filled for her earlier this week,  and will attend all scheduled provider appointments.  [name] continue actively working with [employer], THN RN CM, andTHN CSW.  [name] notify her providers for any concerns, issues, problems, or questions that arise.  Continued [facility] Community CM outreach to continue  with in-home visit later this month, possibly with [facility] pharmacist.  [name], RN, BSN, [place] Care Management  [phone]

## 2016-08-10 DIAGNOSIS — G20 Parkinson's disease: Secondary | ICD-10-CM | POA: Diagnosis not present

## 2016-08-10 DIAGNOSIS — I5023 Acute on chronic systolic (congestive) heart failure: Secondary | ICD-10-CM | POA: Diagnosis not present

## 2016-08-10 DIAGNOSIS — F419 Anxiety disorder, unspecified: Secondary | ICD-10-CM | POA: Diagnosis not present

## 2016-08-10 DIAGNOSIS — I872 Venous insufficiency (chronic) (peripheral): Secondary | ICD-10-CM | POA: Diagnosis not present

## 2016-08-10 DIAGNOSIS — I110 Hypertensive heart disease with heart failure: Secondary | ICD-10-CM | POA: Diagnosis not present

## 2016-08-10 DIAGNOSIS — M1990 Unspecified osteoarthritis, unspecified site: Secondary | ICD-10-CM | POA: Diagnosis not present

## 2016-08-12 ENCOUNTER — Encounter: Payer: Self-pay | Admitting: Internal Medicine

## 2016-08-12 ENCOUNTER — Ambulatory Visit (INDEPENDENT_AMBULATORY_CARE_PROVIDER_SITE_OTHER): Payer: Medicare Other | Admitting: Internal Medicine

## 2016-08-12 VITALS — BP 150/80 | HR 77 | Wt 169.0 lb

## 2016-08-12 DIAGNOSIS — I872 Venous insufficiency (chronic) (peripheral): Secondary | ICD-10-CM

## 2016-08-12 DIAGNOSIS — I5023 Acute on chronic systolic (congestive) heart failure: Secondary | ICD-10-CM | POA: Diagnosis not present

## 2016-08-12 DIAGNOSIS — G20 Parkinson's disease: Secondary | ICD-10-CM | POA: Diagnosis not present

## 2016-08-12 DIAGNOSIS — I110 Hypertensive heart disease with heart failure: Secondary | ICD-10-CM | POA: Diagnosis not present

## 2016-08-12 DIAGNOSIS — F419 Anxiety disorder, unspecified: Secondary | ICD-10-CM | POA: Diagnosis not present

## 2016-08-12 DIAGNOSIS — G20A1 Parkinson's disease without dyskinesia, without mention of fluctuations: Secondary | ICD-10-CM

## 2016-08-12 DIAGNOSIS — M1990 Unspecified osteoarthritis, unspecified site: Secondary | ICD-10-CM | POA: Diagnosis not present

## 2016-08-12 DIAGNOSIS — I10 Essential (primary) hypertension: Secondary | ICD-10-CM

## 2016-08-12 DIAGNOSIS — S81811A Laceration without foreign body, right lower leg, initial encounter: Secondary | ICD-10-CM

## 2016-08-12 NOTE — Progress Notes (Signed)
Location:  [facility] clinic Provider:  Taija Mathias L. Mariea [name], D.O., C.M.D.  Code Status: DNR Goals of Care:  Advanced Directives 08/12/2016  Does patient have an advance directive? Yes  Type of Advance Directive Out of facility DNR (pink MOST or yellow form)  Does patient want to make changes to advanced directive? -  Copy of advanced directive(s) in chart? -  Would patient like information on creating an advanced directive? -  Pre-existing out of facility DNR order (yellow form or pink MOST form) Pink MOST form placed in chart (order not valid for inpatient use)   Chief Complaint  Patient presents with  . Medical Management of Chronic Issues    1 mth follow-up    [name] seen today for f/u on chf.  No chest pain or shortness of breath. Wt has trended up to 169 again when it was 161 at hospital discharge.  She is taking lasix 40mg  daily only.    Her med mgt was somewhat improved per nursing, pharmacy, etc. From [facility], but it was recommended she have a service that puts her pills in pack so she does not have to keep track of it so much.    Got up at 2am and a baseball something hit her in the right leg.  She has a wrap in the leg.  [facility] visiting to check on her.    Has small upside down v shaped wound on anterior shin.  She's had clear to yellow drainage on the dressing.  Both legs are pink but cool to touch at this time. No other ulcerations or drainage.    She has something behind her right eye that needs treatment.  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  Has macular degeneration of both eyes.    Has had 2 to 3 days of sore throat.  Also has start of runny nose.  No headache.  Also coughing a bit more.    Past Medical History:  Diagnosis Date  . Acute on chronic systolic heart failure ([place])   . Anemia   . Anxiety   . Arthritis   . Cataract left  . Cellulitis and abscess of leg, except foot   . Cellulitis of  lower leg 12/24/2011  . CHF (congestive heart failure) ([place])   . Chronic bronchitis ([place])    "usually get it q yr"  . Chronic systolic heart failure ([place])   . GERD (gastroesophageal reflux disease)   . Glaucoma left  . Helicobacter pylori (H. pylori)   . History of bleeding peptic ulcer   . History of blood transfusion    "don't remember why"  . Hypercholesterolemia   . Hypertension   . Hypotension, unspecified   . Hypothyroidism   . Parkinson's disease   . Pneumonia   . Sleep apnea    "had test years ago; insurance wouldn't pay for mask so I never had one" (04/27/2015)  . Stroke [facility]) X 3   "that  was the reason I got Parkinson's"    Past Surgical History:  Procedure Laterality Date  . BACK SURGERY    . ESOPHAGOGASTRODUODENOSCOPY  09/26/2011   Procedure: ESOPHAGOGASTRODUODENOSCOPY (EGD);  Surgeon: Zenovia Jarred, MD;  Location: [facility] ENDOSCOPY;  Service: Gastroenterology;  Laterality: N/A;  To be done at bedside.  . ESOPHAGOGASTRODUODENOSCOPY N/A 09/30/2013   Procedure: ESOPHAGOGASTRODUODENOSCOPY (EGD);  Surgeon: [name], MD;  Location: [facility];  Service: Endoscopy;  Laterality: N/A;  . ESOPHAGOGASTRODUODENOSCOPY N/A 11/25/2013   Procedure: ESOPHAGOGASTRODUODENOSCOPY (EGD);  Surgeon: [name], MD;  Location: [facility] ENDOS[facility];  Service: Endoscopy;  Laterality: N/A;  . HEMIARTHROPLASTY HIP Right 09/24/2007   Ar[facility] Endo 01/23/2011  . KYPHOPLASTY  06/2009   T12/notes 07/17/2009  . NISSEN FUNDOPLICATION     "had OR for GERD"  . TENDON REPAIR     Gluteus medius [facility] 01/23/2011  . THYROIDECTOMY    . TONSILLECTOMY      Allergies  Allergen Reactions  . Codeine Other (See Comments)    Unknown allergic reaction  . Penicillins Other (See Comments)    Has patient had a PCN reaction causing immediate rash, facial/tongue/throat swelling, SOB or lightheadedness with hypotension: No Has patient had a PCN reaction causing severe rash involving mucus membranes or skin necrosis: No Has patient  had a PCN reaction that required hospitalization No Has patient had a PCN reaction occurring within the last 10 years: No If all of the above answers are "NO", then may proceed with Cephalosporin use. Pt passed out in doctor's office after penicillin dose      Medication List       Accurate as of 08/12/16  3:58 PM. Always use your most recent med list.          aspirin EC 81 MG tablet Take 81 mg by mouth daily.   carbidopa-levodopa 25-100 MG tablet Commonly known as:  SINEMET IR Take 1 at 6a, 1 1/2 pills at 9a, 1 at 1p, 1 1/2 pills at 3p, 1 at 6pm, 1 1/2 pills at 9pm   docusate sodium 100 MG capsule Commonly known as:  COLACE Take 100 mg by mouth daily. Reported on 02/28/2016   ferrous sulfate 325 (65 FE) MG EC tablet Take 1 tablet (325 mg total) by mouth daily.   furosemide 40 MG tablet Commonly known as:  LASIX Take 1 tablet (40 mg total) by mouth daily.   latanoprost 0.005 % ophthalmic solution Commonly known as:  XALATAN Place 1 drop into the left eye daily at 6 PM.   LORazepam 0.5 MG tablet Commonly known as:  ATIVAN Take 1 tablet (0.5 mg total) by mouth 2 (two) times daily as needed for anxiety.   metoprolol tartrate 25 MG tablet Commonly known as:  LOPRESSOR Take 1 tablet (25 mg total) by mouth 2 (two) times daily.   multivitamin with minerals tablet Take 1 tablet by mouth daily.   nystatin cream Commonly known as:  MYCOSTATIN Apply 1 application topically 2 (two) times daily.   pramipexole 1 MG tablet Commonly known as:  MIRAPEX Take 1 tablet (1 mg total) by mouth 3 (three) times daily.   SYSTANE OP Place 1 drop into both eyes daily.   triamcinolone cream 0.1 % Commonly known as:  KENALOG Apply 1 application topically 2 (two) times daily.   Vitamin D 2000 units Caps Take 2,000 Units by mouth daily. Reported on 04/10/2016       Review of Systems:  Review of Systems  Constitutional: Negative for chills, fever and weight loss.       Wt up to  169 again (161 at hospital d/c)  Respiratory: Negative for cough and shortness of breath.   Cardiovascular: Positive for leg swelling. Negative for chest pain and palpitations.  Gastrointestinal: Negative for abdominal pain, blood in stool, constipation and melena.  Genitourinary: Negative for dysuria.  Musculoskeletal: Negative for falls, joint pain and myalgias.  Neurological: Positive for tremors and speech change. Negative  for dizziness and loss of consciousness.       Speech was "going in and out more today"  Endo/Heme/Allergies: Bruises/bleeds easily.  Psychiatric/Behavioral: Positive for memory loss. Negative for depression.    Health Maintenance  Topic Date Due  . TETANUS/TDAP  06/07/1960  . ZOSTAVAX  06/06/2000  . DEXA SCAN  06/06/2005  . MAMMOGRAM  11/22/2011  . INFLUENZA VACCINE  Completed  . PNA vac Low Risk Adult  Completed    Physical Exam: Vitals:   08/12/16 1536  BP: (!) 150/80  Pulse: 77  SpO2: 96%  Weight: 169 lb (76.7 kg)   Body mass index is 29.01 kg/m. Physical Exam  Constitutional: She is oriented to person, place, and time. She appears well-nourished. No distress.  Cardiovascular: Normal rate, regular rhythm, normal heart sounds and intact distal pulses.   Pulmonary/Chest: Effort normal and breath sounds normal. She has no rales.  Abdominal: Soft. Bowel sounds are normal.  Neurological: She is alert and oriented to person, place, and time. A sensory deficit is present. She exhibits abnormal muscle tone. Coordination abnormal.  Skin: Skin is warm and dry. There is erythema.  Bilateral legs pink, but cool to touch; upside down V shaped laceration on right leg with some bluish color/bruise beneath; draining light yellow thin fluid where wrapped, no warmth or redness locally, nontender    Labs reviewed: Basic Metabolic Panel:  Recent Labs  05/25/16 1134  05/27/16 0316 05/29/16 0840 05/31/16 0325  06/10/16 0957 06/11/16 0419 06/12/16 0627  NA   --   < > 139 137 138  < > 143 143 141  K  --   < > 4.1 3.6 4.1  < > 3.9 4.0 3.6  CL  --   < > 102 100* 97*  < > 108 107 108  CO2  --   < > 32 25 29  < > 24 29 27   GLUCOSE  --   < > 93 158* 131*  < > 125* 103* 115*  BUN  --   < > 23* 28* 38*  < > 35* 20 18  CREATININE  --   < > 1.00 1.06* 1.15*  < > 0.96 0.84 0.83  CALCIUM  --   < > 9.1 8.8* 9.3  < > 9.5 8.7* 8.8*  MG  --   --  2.2 2.5* 2.6*  --   --   --   --   TSH 0.762  --   --   --   --   --   --   --   --   < > = values in this interval not displayed. Liver Function Tests:  Recent Labs  06/06/16 0935 06/10/16 0957  AST 16 22  ALT 7 13*  ALKPHOS 66 63  BILITOT 0.5 0.5  PROT 6.4 7.5  ALBUMIN 3.7 3.6   No results for input(s): LIPASE, AMYLASE in the last 8760 hours. No results for input(s): AMMONIA in the last 8760 hours. CBC:  Recent Labs  03/11/16 1522  06/06/16 0935 06/10/16 0957 06/11/16 0419 06/12/16 0627  WBC 7.5  < > 11.6* 7.1 7.7 10.0  NEUTROABS 5.3  --  9,628* 4.9  --   --   HGB 10.1*  < > 10.5* 11.8* 10.0* 10.3*  HCT 33.8*  < > 32.2* 39.3 33.0* 34.2*  MCV 81.8  < > 73.5* 79.6 79.3 79.2  PLT 184  < > 167 190 192 183  < > = values in this  interval not displayed.  Assessment/Plan 1. Acute on chronic systolic congestive heart failure (HCC) -weight trended up again and more redness and swelling of bilateral legs with some moisture draining (venous stasis) -pt given written directions to take a second lasix tonight, also 2 tomorrow and Wed and then return to daily lasix thereafter  2. Essential hypertension -bp was slightly up today, too, probably due to the fluid buildup  3. Venous stasis dermatitis of both lower extremities -worse again as she's gained fluid wt, see#1  4. Parkinson's disease ([place]) -progressing, more speech difficulty today -cont current regimen and keep neuro f/u  5. Laceration of skin of right lower leg, initial encounter -change dressing every other day, should heal quickly as long  as her swelling is improving -elevate feet at rest  I also encouraged her to accept the blister pack pill mgt system and she also seems to like the idea.  Labs/tests ordered:  No orders of the defined types were placed in this encounter.   Next appt:  08/23/2016   Vikash Nest L. Makani Seckman, D.O. [facility] [address], [place] [address] Cell Phone (Mon-Fri 8am-5pm):  [phone] On Call:  [phone] & follow prompts after 5pm & weekends Office Phone:  [phone] Office Fax:  [phone]

## 2016-08-12 NOTE — Patient Instructions (Addendum)
Take lasix 40mg  twice a day for 3 days, then return to once a day lasix 40mg .    Cont to keep your wound covered until it stops draining.  If the drainage becomes thick, yellow or green or the leg becomes painful, call me back.

## 2016-08-13 ENCOUNTER — Ambulatory Visit (INDEPENDENT_AMBULATORY_CARE_PROVIDER_SITE_OTHER): Payer: Medicare Other | Admitting: Nurse Practitioner

## 2016-08-13 ENCOUNTER — Encounter: Payer: Self-pay | Admitting: Nurse Practitioner

## 2016-08-13 VITALS — BP 94/66 | HR 84 | Ht 64.0 in | Wt 170.4 lb

## 2016-08-13 DIAGNOSIS — I110 Hypertensive heart disease with heart failure: Secondary | ICD-10-CM | POA: Diagnosis not present

## 2016-08-13 DIAGNOSIS — I872 Venous insufficiency (chronic) (peripheral): Secondary | ICD-10-CM | POA: Diagnosis not present

## 2016-08-13 DIAGNOSIS — G20 Parkinson's disease: Secondary | ICD-10-CM

## 2016-08-13 DIAGNOSIS — R269 Unspecified abnormalities of gait and mobility: Secondary | ICD-10-CM | POA: Diagnosis not present

## 2016-08-13 DIAGNOSIS — G20A1 Parkinson's disease without dyskinesia, without mention of fluctuations: Secondary | ICD-10-CM

## 2016-08-13 DIAGNOSIS — G249 Dystonia, unspecified: Secondary | ICD-10-CM

## 2016-08-13 DIAGNOSIS — F419 Anxiety disorder, unspecified: Secondary | ICD-10-CM | POA: Diagnosis not present

## 2016-08-13 DIAGNOSIS — I5023 Acute on chronic systolic (congestive) heart failure: Secondary | ICD-10-CM | POA: Diagnosis not present

## 2016-08-13 DIAGNOSIS — M1990 Unspecified osteoarthritis, unspecified site: Secondary | ICD-10-CM | POA: Diagnosis not present

## 2016-08-13 MED ORDER — CARBIDOPA-LEVODOPA 25-100 MG PO TABS: ORAL_TABLET | ORAL | 6 refills | Status: DC

## 2016-08-13 NOTE — Progress Notes (Addendum)
[facility]  PATIENT: [name] DOB: [birth year]   REASON FOR VISIT: Follow-up for Parkinson's disease HISTORY FROM: Patient and c/g     HISTORY OF PRESENT ILLNESS: HISTORY [name]. Wiebke is a 75 year old right-handed woman with an underlying complex medical history of hypertension, arthritis, anemia, cellulitis, chronic systolic heart failure, peptic ulcer disease, and chronic low back pain with history of T12 compression fracture, status post kyphoplasty in October 2010, who presents for follow-up consultation of her Parkinson's disease, complicated by motor complications, motor fluctuations and recurrent falls, deconditioning, recent cellulitis.The patient is unaccompanied today. [name] her live-in caretaker moved to [facility] to be with her parents, Solveig says. I last saw her on 10/10/2015, at which time the patient reported feeling stable. [name] reported no recent falls. She did report of recent fall about 3 weeks prior in the kitchen, indicating that she let go of her walker and fell backwards. She did not hurt her self, she had ongoing issues with severe dyskinesias and some visual hallucinations. She had lower extremity swelling. She had been using oxycodone as needed for low back pain. I've reduced her Mirapex because of lower extremity swelling. She was advised to continue with Sinemet, one pills 6 times a day.    05/17/2017SA: She reports that the increase in C/L helped with the freezing. She takes 1 pill, alternating with 1 1/2 pills, every 3 hours, starting at 6 AM and ending at 9 PM. She did fall recently as she was getting out of her new recliner and thankfully, [name] was there, her nephew's Winslow Prindle) wife. She checks on patient morning and evening. Her brother ([name]) lives in [place], her other brother (in [place]) brings her supper and for lunch she has meals on wheels. She was seen in the interim by her primary care physician on 01/26/2016 and was noted to  have right lower extremity cellulitis. She was supposed to complete dual antibiotic treatment. She needed more supervision at the house and a social work referral was done. I increased her Sinemet in the interim to 1 pill alternating with 1-1/2 pills. She called in late April reporting more freezing.  UPDATE 06/18/2016 CM Ms. Margosian, 75 year old female returns for follow-up for Parkinson's disease. She was admitted to the hospital on 06/10/16  due to syncopal episode. She had a previous hospitalization for a week earlier in the month for acute on chronic congestive heart failure. Apparently she fell at home and was found on the floor by caregiver brought to the ER. Etiology was unclear orthostatics were negative imaging on admission was negative for acute findings. She was discharged to a skilled facility for rehabilitation which she is getting physical therapy every day. In terms of her Parkinson's she is doing fairly well. She does not always get her medication on time at the facility. She denies any freezing spells she returns for reevaluation UPDATE 11/21/2017CM Ms. Keenan Bachelor, 75 year old female returns for follow-up. She has a history of Parkinson's disease complicated by motor complications, motor fluctuations and recurrent falls, deconditioning. She is back at home and has a caregiver 3 hours a day.  She has had 2 hospital admissions for  Congestive heart failure since last seen. She is not  always compliant with taking her Sinemet on time. Both lower extremities are edematous and pink to touch. She was seen by her primary care yesterday who ordered Lasix twice daily for 3 days however she has not started that. She continues to get physical therapy. She complains with  pain/weakness  in the lower extremities. She stats in the  recliner most of the time. She was getting occupational therapy but  this concluded she does not do her home exercise program and was encouraged to do so. She has wearing off phenomena about  15 minutes before her next Sinemet dose is due according to the caregiver . I see no tremor today.She returns for reevaluation. She has [place] .   REVIEW OF SYSTEMS: Full 14 system review of systems performed and notable only for those listed, all others are neg:  Constitutional:Fatigue  Cardiovascular:Bilateral edematous lower extremities  Ear/Nose/Throat: neg  Skin: neg Eyes: neg Respiratory:Shortness of breath  Gastroitestinal: neg  Hematology/Lymphatic: neg  Endocrine: neg Musculoskeletal: Gait abnormality, Allergy/Immunology: neg Neurological: neg Psychiatric: neg Sleep : neg   ALLERGIES: Allergies  Allergen Reactions  . Codeine Other (See Comments)    Unknown allergic reaction  . Penicillins Other (See Comments)    Has patient had a PCN reaction causing immediate rash, facial/tongue/throat swelling, SOB or lightheadedness with hypotension: No Has patient had a PCN reaction causing severe rash involving mucus membranes or skin necrosis: No Has patient had a PCN reaction that required hospitalization No Has patient had a PCN reaction occurring within the last 10 years: No If all of the above answers are "NO", then may proceed with Cephalosporin use. Pt passed out in doctor's office after penicillin dose    HOME MEDICATIONS: Outpatient Medications Prior to Visit  Medication Sig Dispense Refill  . aspirin EC 81 MG tablet Take 81 mg by mouth daily.    . carbidopa-levodopa (SINEMET IR) 25-100 MG tablet Take 1 at 6a, 1 1/2 pills at 9a, 1 at 1p, 1 1/2 pills at 3p, 1 at 6pm, 1 1/2 pills at 9pm    . Cholecalciferol (VITAMIN D) 2000 units CAPS Take 2,000 Units by mouth daily. Reported on 04/10/2016    . docusate sodium (COLACE) 100 MG capsule Take 100 mg by mouth daily. Reported on 02/28/2016    . ferrous sulfate 325 (65 FE) MG EC tablet Take 1 tablet (325 mg total) by mouth daily. 90 tablet 3  . furosemide (LASIX) 40 MG tablet Take 1 tablet (40 mg total) by mouth  daily.  0  . latanoprost (XALATAN) 0.005 % ophthalmic solution Place 1 drop into the left eye daily at 6 PM.     . LORazepam (ATIVAN) 0.5 MG tablet Take 1 tablet (0.5 mg total) by mouth 2 (two) times daily as needed for anxiety. 20 tablet 0  . metoprolol tartrate (LOPRESSOR) 25 MG tablet Take 1 tablet (25 mg total) by mouth 2 (two) times daily.  0  . Multiple Vitamins-Minerals (MULTIVITAMIN WITH MINERALS) tablet Take 1 tablet by mouth daily.    Marland Kitchen nystatin cream (MYCOSTATIN) Apply 1 application topically 2 (two) times daily.    Vladimir Faster Glycol-Propyl Glycol (SYSTANE OP) Place 1 drop into both eyes daily.     . pramipexole (MIRAPEX) 1 MG tablet Take 1 tablet (1 mg total) by mouth 3 (three) times daily. 270 tablet 3  . triamcinolone cream (KENALOG) 0.1 % Apply 1 application topically 2 (two) times daily.     No facility-administered medications prior to visit.     PAST MEDICAL HISTORY: Past Medical History:  Diagnosis Date  . Acute on chronic systolic heart failure ([place])   . Anemia   . Anxiety   . Arthritis   . Cataract left  . Cellulitis and abscess of leg, except foot   .  Cellulitis of lower leg 12/24/2011  . CHF (congestive heart failure) ([place])   . Chronic bronchitis ([place])    "usually get it q yr"  . Chronic systolic heart failure ([place])   . GERD (gastroesophageal reflux disease)   . Glaucoma left  . Helicobacter pylori (H. pylori)   . History of bleeding peptic ulcer   . History of blood transfusion    "don't remember why"  . Hypercholesterolemia   . Hypertension   . Hypotension, unspecified   . Hypothyroidism   . Parkinson's disease   . Pneumonia   . Sleep apnea    "had test years ago; insurance wouldn't pay for mask so I never had one" (04/27/2015)  . Stroke [facility]) X 3   "that  was the reason I got Parkinson's"    PAST SURGICAL HISTORY: Past Surgical History:  Procedure Laterality Date  . BACK SURGERY    . ESOPHAGOGASTRODUODENOSCOPY  09/26/2011   Procedure:  ESOPHAGOGASTRODUODENOSCOPY (EGD);  Surgeon: Zenovia Jarred, MD;  Location[facility] ENDOSCOPY;  Service: Gastroenterology;  Laterality: N/A;  To be done at bedside.  . ESOPHAGOGASTRODUODENOSCOPY N/A 09/30/2013   Procedure: ESOPHAGOGASTRODUODENOSCOPY (EGD);  Surgeon: [name], MD;  Location: [facility];  Service: Endoscopy;  Laterality: N/A;  . ESOPHAGOGASTRODUODENOSCOPY N/A 11/25/2013   Procedure: ESOPHAGOGASTRODUODENOSCOPY (EGD);  Surgeon: [name], MD;  Location: [facility];  Service: Endoscopy;  Laterality: N/A;  . HEMIARTHROPLASTY HIP Right 09/24/2007   [facility] 01/23/2011  . KYPHOPLASTY  06/2009   T12/notes 07/17/2009  . NISSEN FUNDOPLICATION     "had OR for GERD"  . TENDON REPAIR     Gluteus medius [facility] 01/23/2011  . THYROIDECTOMY    . TONSILLECTOMY      FAMILY HISTORY: Family History  Problem Relation Age of Onset  . GER disease Mother   . Heart disease Father     SOCIAL HISTORY: Social History   Social History  . Marital status: Single    Spouse name: N/A  . Number of children: 0  . Years of education: 12   Occupational History  .      Retired   Social History Main Topics  . Smoking status: Never Smoker  . Smokeless tobacco: Never Used  . Alcohol use No  . Drug use: No  . Sexual activity: No   Other Topics Concern  . Not on file   Social History Narrative   Patient lives at home alone. Patient is retired. Patient has high school education.   Caffeine- one daily.   Right handed.     PHYSICAL EXAM  Vitals:   08/13/16 0839  Height: 5\' 4"  (1.626 m)   Body mass index is 29.01 kg/m.  Generalized: Well developed, Pleasant female in no acute distress  Head: normocephalic and atraumatic,. Oropharynx benign  Neck: Supple, no carotid bruits  Cardiac: Regular rate rhythm, no murmur  Musculoskeletal: Arthritic changes in the hands Skin  Both lower extremities pedal edema and  Swelling to mid Shin  Neurological examination   Mentation: Alert oriented to  time, place, history taking. Attention span and concentration appropriate. Recent and remote memory intact.  Follows all commands speech is hypophonic and language fluent.   Cranial nerve II-XII: Pupils were equal round reactive to light extraocular movements were full, visual field were full on confrontational test. There is mild limitation to upgaze. There is mild decreased eye blink. Mild masking of the face with normal facial sensation Facial sensation and strength were normal. hearing was intact to finger  rubbing bilaterally. Uvula tongue midline. Tongue protrusion into cheek strength was normal. Motor: normal bulk and tone, full strength in the BUE, BLE, mild generalized dyskinesias noted, mildly reduced tone no cogwheeling. Moderate bradykinesia no pronator drift. No resting tremor  Sensory: normal and symmetric to light touch, in the upper and lower extremities Coordination: finger-nose-finger, heel-to-shin bilaterally, no dysmetria Reflexes: Symmetric upper and lower plantar responses were flexor bilaterally. Gait and Station: Not ambulated in wheelchair   DIAGNOSTIC DATA (LABS, IMAGING, TESTING) - I reviewed patient records, labs, notes, testing and imaging myself where available.  Lab Results  Component Value Date   WBC 10.0 06/12/2016   HGB 10.3 (L) 06/12/2016   HCT 34.2 (L) 06/12/2016   MCV 79.2 06/12/2016   PLT 183 06/12/2016      Component Value Date/Time   NA 141 06/12/2016 0627   NA 142 02/15/2016 1506   K 3.6 06/12/2016 0627   CL 108 06/12/2016 0627   CO2 27 06/12/2016 0627   GLUCOSE 115 (H) 06/12/2016 0627   BUN 18 06/12/2016 0627   BUN 21 02/15/2016 1506   CREATININE 0.83 06/12/2016 0627   CREATININE 1.68 (H) 06/06/2016 0935   CALCIUM 8.8 (L) 06/12/2016 0627   PROT 7.5 06/10/2016 0957   PROT 5.8 (L) 04/03/2015 1350   ALBUMIN 3.6 06/10/2016 0957   ALBUMIN 3.8 04/03/2015 1350   AST 22 06/10/2016 0957   ALT 13 (L) 06/10/2016 0957   ALKPHOS 63 06/10/2016 0957    BILITOT 0.5 06/10/2016 0957   BILITOT 0.4 04/03/2015 1350   GFRNONAA >60 06/12/2016 0627   GFRAA >60 06/12/2016 0627    Lab Results  Component Value Date   TSH 0.762 05/25/2016      ASSESSMENT AND PLAN 75 year old female with an underlying complex medical history of hypertension, arthritis, anemia,  chronic systolic heart failure and cor pulmonale, peptic ulcer disease, and chronic low back pain with history of T12 compression fracture, status post kyphoplasty in October 2010, who presents for follow-up for  her parkinsonism with Sx dating back to 1999. Her history and physical exam are in keeping with, left-sided predominant PD, complicated by dyskinesias, overall frailty,  freezing and recurrent falls. She is currently receiving physical therapy. Occupational therapy has really stopped . She also has bilateral lower extremity edema at present and is supposed to have increased her Lasix to twice daily for 3 days. She has been hospitalized twice since last seen for congestive heart failure. Marland Kitchen     PLAN: Continue Sinemet and Mirapex at current doses. Please take at prescribed times. Continue Pt and HEP when discharged Start HEP provided by OT Keep legs elevated when seated  Take your lasix as prescribed to decrease swelling in legs F/U with Dr. Rexene Alberts as planned Spent 25 minutes in total face-to-face time with the patient, and her caregiver, more than 50% of which was spent in counseling and coordination of care, reviewing recent MD visits,  reviewing medication and discussing and  reviewing the diagnosis of PD, its prognosis and treatment options. Dennie Bible, [facility], [place], APRN  [facility] [address] [place], [place] [address] [phone]  I reviewed the above note and documentation by the Nurse Practitioner and agree with the history, physical exam, assessment and plan as outlined above.  Star Age, MD, PhD [facility]  [facility])

## 2016-08-13 NOTE — Patient Instructions (Signed)
Continue Sinemet and Mirapex at current doses. Please take at prescribed times. Continue Pt and HEP when discharged Start HEP provided by OT Keep legs elevated when seated  Take your lasix as prescribed to decrease swelling in legs F/U with Dr. Rexene Alberts as planned

## 2016-08-14 ENCOUNTER — Telehealth: Payer: Self-pay

## 2016-08-14 DIAGNOSIS — I5023 Acute on chronic systolic (congestive) heart failure: Secondary | ICD-10-CM | POA: Diagnosis not present

## 2016-08-14 DIAGNOSIS — M1990 Unspecified osteoarthritis, unspecified site: Secondary | ICD-10-CM | POA: Diagnosis not present

## 2016-08-14 DIAGNOSIS — I110 Hypertensive heart disease with heart failure: Secondary | ICD-10-CM | POA: Diagnosis not present

## 2016-08-14 DIAGNOSIS — I872 Venous insufficiency (chronic) (peripheral): Secondary | ICD-10-CM | POA: Diagnosis not present

## 2016-08-14 DIAGNOSIS — F419 Anxiety disorder, unspecified: Secondary | ICD-10-CM | POA: Diagnosis not present

## 2016-08-14 DIAGNOSIS — G20 Parkinson's disease: Secondary | ICD-10-CM | POA: Diagnosis not present

## 2016-08-14 NOTE — Telephone Encounter (Signed)
I just wanted her to have the wound covered and the area wrapped with kerlix every other day until the wound dries up.  Nothing elaborate.

## 2016-08-14 NOTE — Telephone Encounter (Signed)
Order faxed, [place] aware

## 2016-08-14 NOTE — Telephone Encounter (Signed)
Crystal with [name] called to clarify if Dr.Reed gave an order for dressing changes to patient legs.  I reviewed last OV note and informed Crystal of Dr.Reed's recommendation to wrap legs every other day.   Crystal needs a more detailed order that indicates what to wrap legs with. Crystal would also like to remind [place] that anytime we give orders for patient we need to fax an order to [name] fax number is [phone]  Please advise

## 2016-08-16 ENCOUNTER — Other Ambulatory Visit: Payer: Self-pay | Admitting: Licensed Clinical Social Worker

## 2016-08-16 DIAGNOSIS — F419 Anxiety disorder, unspecified: Secondary | ICD-10-CM | POA: Diagnosis not present

## 2016-08-16 DIAGNOSIS — I872 Venous insufficiency (chronic) (peripheral): Secondary | ICD-10-CM | POA: Diagnosis not present

## 2016-08-16 DIAGNOSIS — I5023 Acute on chronic systolic (congestive) heart failure: Secondary | ICD-10-CM | POA: Diagnosis not present

## 2016-08-16 DIAGNOSIS — I110 Hypertensive heart disease with heart failure: Secondary | ICD-10-CM | POA: Diagnosis not present

## 2016-08-16 DIAGNOSIS — G20 Parkinson's disease: Secondary | ICD-10-CM | POA: Diagnosis not present

## 2016-08-16 DIAGNOSIS — M1990 Unspecified osteoarthritis, unspecified site: Secondary | ICD-10-CM | POA: Diagnosis not present

## 2016-08-16 NOTE — Patient Outreach (Signed)
[facility]) Care Management  08/16/2016  RUDOLPH FINNEMORE 10-Jan-1941 [id number]   Assessment- CSW completed call to patient. Patient answered. CSW wanted to ensure that patient was able to set up stable transportation to [facility] next week. She states that her caregiver will be able to transport her.  Plan-CSW will follow up with patient within three weeks.  [name], BSW, MSW, [email or web address] Phone: [phone] Fax: [phone]

## 2016-08-19 DIAGNOSIS — G20 Parkinson's disease: Secondary | ICD-10-CM | POA: Diagnosis not present

## 2016-08-19 DIAGNOSIS — I872 Venous insufficiency (chronic) (peripheral): Secondary | ICD-10-CM | POA: Diagnosis not present

## 2016-08-19 DIAGNOSIS — I5023 Acute on chronic systolic (congestive) heart failure: Secondary | ICD-10-CM | POA: Diagnosis not present

## 2016-08-19 DIAGNOSIS — M1990 Unspecified osteoarthritis, unspecified site: Secondary | ICD-10-CM | POA: Diagnosis not present

## 2016-08-19 DIAGNOSIS — F419 Anxiety disorder, unspecified: Secondary | ICD-10-CM | POA: Diagnosis not present

## 2016-08-19 DIAGNOSIS — I110 Hypertensive heart disease with heart failure: Secondary | ICD-10-CM | POA: Diagnosis not present

## 2016-08-20 ENCOUNTER — Ambulatory Visit: Payer: Medicare Other | Admitting: *Deleted

## 2016-08-20 DIAGNOSIS — Z79899 Other long term (current) drug therapy: Secondary | ICD-10-CM | POA: Diagnosis not present

## 2016-08-20 DIAGNOSIS — I110 Hypertensive heart disease with heart failure: Secondary | ICD-10-CM | POA: Diagnosis not present

## 2016-08-20 DIAGNOSIS — I10 Essential (primary) hypertension: Secondary | ICD-10-CM | POA: Diagnosis not present

## 2016-08-20 DIAGNOSIS — I5023 Acute on chronic systolic (congestive) heart failure: Secondary | ICD-10-CM | POA: Diagnosis not present

## 2016-08-20 DIAGNOSIS — H40001 Preglaucoma, unspecified, right eye: Secondary | ICD-10-CM | POA: Diagnosis not present

## 2016-08-20 DIAGNOSIS — H353112 Nonexudative age-related macular degeneration, right eye, intermediate dry stage: Secondary | ICD-10-CM | POA: Diagnosis not present

## 2016-08-20 DIAGNOSIS — H353121 Nonexudative age-related macular degeneration, left eye, early dry stage: Secondary | ICD-10-CM | POA: Diagnosis not present

## 2016-08-20 DIAGNOSIS — G20 Parkinson's disease: Secondary | ICD-10-CM | POA: Diagnosis not present

## 2016-08-20 DIAGNOSIS — H4032X1 Glaucoma secondary to eye trauma, left eye, mild stage: Secondary | ICD-10-CM | POA: Diagnosis not present

## 2016-08-20 DIAGNOSIS — Z7982 Long term (current) use of aspirin: Secondary | ICD-10-CM | POA: Diagnosis not present

## 2016-08-20 DIAGNOSIS — I872 Venous insufficiency (chronic) (peripheral): Secondary | ICD-10-CM | POA: Diagnosis not present

## 2016-08-20 DIAGNOSIS — H2513 Age-related nuclear cataract, bilateral: Secondary | ICD-10-CM | POA: Diagnosis not present

## 2016-08-20 DIAGNOSIS — M1990 Unspecified osteoarthritis, unspecified site: Secondary | ICD-10-CM | POA: Diagnosis not present

## 2016-08-20 DIAGNOSIS — Z885 Allergy status to narcotic agent status: Secondary | ICD-10-CM | POA: Diagnosis not present

## 2016-08-20 DIAGNOSIS — F419 Anxiety disorder, unspecified: Secondary | ICD-10-CM | POA: Diagnosis not present

## 2016-08-20 DIAGNOSIS — Z880 Allergy status to penicillin: Secondary | ICD-10-CM | POA: Diagnosis not present

## 2016-08-20 DIAGNOSIS — Z8673 Personal history of transient ischemic attack (TIA), and cerebral infarction without residual deficits: Secondary | ICD-10-CM | POA: Diagnosis not present

## 2016-08-21 ENCOUNTER — Other Ambulatory Visit: Payer: Self-pay | Admitting: *Deleted

## 2016-08-21 ENCOUNTER — Telehealth: Payer: Self-pay

## 2016-08-21 ENCOUNTER — Encounter: Payer: Self-pay | Admitting: *Deleted

## 2016-08-21 DIAGNOSIS — F419 Anxiety disorder, unspecified: Secondary | ICD-10-CM | POA: Diagnosis not present

## 2016-08-21 DIAGNOSIS — I872 Venous insufficiency (chronic) (peripheral): Secondary | ICD-10-CM | POA: Diagnosis not present

## 2016-08-21 DIAGNOSIS — I110 Hypertensive heart disease with heart failure: Secondary | ICD-10-CM | POA: Diagnosis not present

## 2016-08-21 DIAGNOSIS — G20 Parkinson's disease: Secondary | ICD-10-CM | POA: Diagnosis not present

## 2016-08-21 DIAGNOSIS — I5023 Acute on chronic systolic (congestive) heart failure: Secondary | ICD-10-CM | POA: Diagnosis not present

## 2016-08-21 DIAGNOSIS — M1990 Unspecified osteoarthritis, unspecified site: Secondary | ICD-10-CM | POA: Diagnosis not present

## 2016-08-21 NOTE — Telephone Encounter (Signed)
Spoke with Richarda Osmond [facility] Nurse), patient is taking Lopressor once daily and we have it listed as 1 tablet twice daily, need clarification  Patient with black stools, aware patient is on Iron. Patient's bowels has a GI bloody smell. Patient told Richarda Osmond that this is normal when she eats greens and because she is on Iron.     Please advise

## 2016-08-21 NOTE — Patient Outreach (Signed)
[facility] [facility]) Care Management [place] Telephone Outreach x 2, Care Coordination 08/21/2016  [name] 1940-12-11 [id number]  [name] is an 75 y.o. female well known to Nellie CMfor multiple hospitalizations and safety concerns. [name] most recently admitted to the hospital September 18-20, 2017,after experiencing syncopal episode/ AMS at home. Patient was discharged to [name] for rehabiliation, and was subsequently discharged home from [facility] on July 05, 2016 with home health [facility]) services.  Received call back from voice mail left earlier, from [place] at Dr. Cyndi Lennert office after [facility] CM in-home visit this morning and shared that patient is taking Lopressor inconsistent with current orders in EMR, requested clarification and made Ch'rae aware that this medication had been called in to patient's pharmacy for refill today.  Also made Ch'rae aware as FYI of pt.'s cargiver report that patient's stool was "black" this morning, which I did not personally witness, however, when I went into patient's bathroom there was an odor consistent with GI bleeding.  Patient had reported that this occurs "every time" she "eats greens."   Patient was not in any distress during [place] in-home visit this morning.  Patient is noted to be on daily iron supplementation.  Plan:  Will collaborate with patient's PCP as needed for continuity of patient care.  [name], RN, BSN, [employer] [facility] Care Management  [phone]

## 2016-08-21 NOTE — Patient Outreach (Signed)
[facility]) Care Management  [place] Routine Home Visit 08/21/2016  RYLAND GROM 10/01/1940 [id number]  KENA PERFECTO is an 75 y.o. female well known to [place] CMfor multiple hospitalizations and safety concerns. [name] most recently admitted to the hospital September 18-20, 2017,after experiencing syncopal episode/ AMS at home. Patient was discharged to [name] for rehabiliation, and was subsequently discharged home from [facility] on July 05, 2016 with home health [facility]) services.[facility] CSW and [place] actively [employer] care for [employer] evaluation.  Patient and her personal care assistant, [name], are present today for home visit.  Today, Montique continues toreport that she is "doing just fine and feeling good," and she denies concerns, needs, problems or issues.  Subjective: "I've been getting along real good.  I am feeling good and doing what the doctor tells me I am supposed to do."  -- [place] services remain active with [name] agency; patient reports active participation with [facility] services of RN, PT, bath aide.  Patient reports that she incurred injury at home last week when her brother's sports trophy fell off the shelf and "cut" her leg.  Patient reports that this was dressed by [facility] RN "yesterday" and dressing is C/D/I.  Dr. Mariea Clonts aware of patient's recent injury.  -- Personal care assistant [name] continues working with patient 5 days a week during morning hours.  -- Medications:  Lopressor bottle (in patient home) instructions read: 25 mg; take one half tablet BID; however, current EMR order reads 25 mg BID; patient's pill box was filled during last [facility] Routine home visit to take as ordered in EMR:  25 mg po BID, however, patient has not been taking evening dose of Lopressor; all spaces in evening pill box have Lopressor in them untouched from last in-home visit.  Patient has only been taking Lopressor 25 mg po QD for  approximately 6 weeks since her discharge from [name].  Dr Mariea Clonts called to inform of this, for purposes of clarification.  Voice mail message left on Dr. Cyndi Lennert triage line, asking for return call-back.  ** Lopressor re-ordered at patient's pharmacy [facility]) today, as patient is almost out of medication; patient states brother will pick up.  Patient states that she will continue taking one 25 mg pill QD until she receives clarification from Dr. Mariea Clonts on how many times per day she should be taking.  ** Oxycodone 5 mg in home x 2 bottles, NOT currently on medication list in EMR; one Rx dated as filled 11/01/15, the other bottle reads as filled 12/29/15; patient reports taking rarely for chronic back pain; states PCP aware that she takes this medication occasionly.  ** Pill box re-filled for 2 weeks  ** Patient reports that she has been keeping all medications in "centralized bin" we previously created, however, she is NOT keeping her Sinemet, Mirapex, or Lasix in the bin, or in the pill box, as patient reports that she takes lasix 40 mg at 0600 am QD, and has a smaller pill box for the sinemet and mirapex.  Patient otherwise reports compliance/ adherence with current medication regimen.  Medication reconciliation was performed with patient today.  -- Upcoming provider appointments:  Reviewed upcoming provider appointments with patient, and she is aware of upcoming PCP appointments. Patient states she either already has transportation or will arrange in plenty of time prior to appointment.  -- self-health management of chronic disease state of CHF:  Patient appears to be stable; bilaterally, legs are  not swollen today, and patient reports that she took "extra" lasix as Dr. Mariea Clonts instructed on August 12, 2016 during recent [place].  Patient is unable to complete daily weights due to difficulty of standing without use of her walker to obtain an accurate weight.  Patient is in no distress today, and her shortness of  breath with activity appears to be at her baseline from previous [facility] CM in-home visits. See ROS/ PE.  -- Denies signs/ symptoms URI as previously reported to Dr. Mariea Clonts on 08/12/16 during [place].  Patient states her symptoms "cleared up."  Patient is not in any apparent distress today.   Objective:    BP 122/72   Pulse 70   Resp 16   SpO2 97%    Review of Systems  Constitutional: Negative.  Negative for malaise/fatigue.  HENT: Negative for congestion and sore throat.   Respiratory: Positive for shortness of breath. Negative for cough, sputum production and wheezing.        Baseline SOB with activity; no change from previous [place] in-home visits; patient recovers easily with rest  Cardiovascular: Negative.  Negative for chest pain and leg swelling.       No LE edema noted today. (R) LE wrapped with Kerlix by [facility] RN for injury patient incurred when trophy fell off shelf and hit her in the leg.  Bilateral LE erythema noted, but appears at baseline.  Bilateral LE are pink from knees to ankles.  Patient reports using prescribed creams on LE's   Gastrointestinal: Negative for abdominal pain and nausea.       Pt.'s caregiver noted dark tarry stool in toilet today, states "it was black." Patient reports this "happens every time I eat greens and chocolate."  Patient noted to be on daily iron supplement; will make PCP aware as FYI  Genitourinary: Positive for frequency. Negative for dysuria.  Musculoskeletal: Negative for back pain, falls and myalgias.       Chronic back pain; pt. reports no pain today  Neurological: Positive for tremors. Negative for dizziness and weakness.       Patient has Parkinson's disease; baseline tremors noted  Psychiatric/Behavioral: Negative for depression. The patient is not nervous/anxious and does not have insomnia.     Physical Exam  Constitutional: She is oriented to person, place, and time. She appears well-developed and well-nourished. No distress.   Cardiovascular: Normal rate, regular rhythm, normal heart sounds and intact distal pulses.   Respiratory: Effort normal and breath sounds normal. No respiratory distress. She has no wheezes. She has no rales.  Baseline SOB with activity; see ROS  GI: Soft. Bowel sounds are normal.  Musculoskeletal: She exhibits no edema.  Neurological: She is alert and oriented to person, place, and time.  Skin: Skin is warm and dry. There is erythema.  Bilateral LE erythema; see ROS  Psychiatric: She has a normal mood and affect. Her behavior is normal. Judgment and thought content normal.    Encounter Medications:   Outpatient Encounter Prescriptions as of 08/21/2016  Medication Sig  . aspirin EC 81 MG tablet Take 81 mg by mouth daily.  . carbidopa-levodopa (SINEMET IR) 25-100 MG tablet Take 1 at 6a, 1 1/2 pills at 9a, 1 at 1p, 1 1/2 pills at 3p, 1 at 6pm, 1 1/2 pills at 9pm  . Cholecalciferol (VITAMIN D) 2000 units CAPS Take 2,000 Units by mouth daily. Reported on 04/10/2016  . docusate sodium (COLACE) 100 MG capsule Take 100 mg by mouth daily. Reported on  02/28/2016  . ferrous sulfate 325 (65 FE) MG EC tablet Take 1 tablet (325 mg total) by mouth daily.  . furosemide (LASIX) 40 MG tablet Take 1 tablet (40 mg total) by mouth daily.  Marland Kitchen latanoprost (XALATAN) 0.005 % ophthalmic solution Place 1 drop into the left eye daily at 6 PM.   . LORazepam (ATIVAN) 0.5 MG tablet Take 1 tablet (0.5 mg total) by mouth 2 (two) times daily as needed for anxiety.  . metoprolol tartrate (LOPRESSOR) 25 MG tablet Take 1 tablet (25 mg total) by mouth 2 (two) times daily.  . Multiple Vitamins-Minerals (MULTIVITAMIN WITH MINERALS) tablet Take 1 tablet by mouth daily.  Marland Kitchen nystatin cream (MYCOSTATIN) Apply 1 application topically 2 (two) times daily.  Vladimir Faster Glycol-Propyl Glycol (SYSTANE OP) Place 1 drop into both eyes daily.   . pramipexole (MIRAPEX) 1 MG tablet Take 1 tablet (1 mg total) by mouth 3 (three) times daily.  Marland Kitchen  triamcinolone cream (KENALOG) 0.1 % Apply 1 application topically 2 (two) times daily.   No facility-administered encounter medications on file as of 08/21/2016.     Functional Status:   In your present state of health, do you have any difficulty performing the following activities: 07/09/2016 05/27/2016  Hearing? N N  Vision? N N  Difficulty concentrating or making decisions? N N  Walking or climbing stairs? Y Y  Dressing or bathing? Y Y  Doing errands, shopping? [name]  Preparing Food and eating ? Y -  Using the Toilet? N -  In the past six months, have you accidently leaked urine? N -  Do you have problems with loss of bowel control? N -  Managing your Medications? N -  Managing your Finances? N -  Housekeeping or managing your Housekeeping? Y -  Some recent data might be hidden    Fall/Depression Screening:    PHQ 2/9 Scores 08/12/2016 07/09/2016 06/06/2016 05/14/2016 03/29/2016 03/19/2016 02/21/2016  PHQ - 2 Score 0 0 0 0 0 0 0    Assessment:   Safety issues with Vera's medicationsthat were identified during [place] in-home visit continue to persist, despite previous efforts made to correct, although patient is doing better with medication adherence/ compliance. Neve continues to verbalize inconsistent reporting of her current medication dosing for Lopressor, and has not been taking this medication as it is currently prescribed (is taking only once QD, instead of BID as it is ordered). Beatric appears to be compliant with her other medications.  Although she has continued to not keep her medications in one central location in her home, she has shown improvement in knowing where her medications are placed within her home, and she can easily locate all of her medications today. Kiora is agreeable to my placing another referral to [place] to explore options for medication management, such as blister packaging.  Nicollette is committed to keeping her hospital follow up provider  appointments, and agrees to contact her providers promptly for any new concerns, issues, or problems that arise.  Plan:   [name] take her medications as prescribed and will attend all scheduled provider appointments.  Chiquita will continue using pill boxes for all of Carlynn's prescription medications, for next 2 weeks.  [name] continue actively working with [facility] services, [facility] RN CM, and[facility] CSW and [facility] pharmacy.  [name] notify her providers for any concerns, issues, problems, or questions that arise.  Continued [facility] CM outreach for self-health management of chronic disease state of CHF and medication management issues  to continue with routine home visit scheduled in 2 weeks.  I will make Dr. Mariea Clonts aware of patient's inconsistent adherence with current dosing of Lopressor so this can be clarified with patient.  [name], RN, BSN, [employer] [facility] Care Management  [phone]

## 2016-08-22 ENCOUNTER — Other Ambulatory Visit: Payer: Self-pay | Admitting: *Deleted

## 2016-08-22 DIAGNOSIS — G20 Parkinson's disease: Secondary | ICD-10-CM | POA: Diagnosis not present

## 2016-08-22 DIAGNOSIS — I872 Venous insufficiency (chronic) (peripheral): Secondary | ICD-10-CM | POA: Diagnosis not present

## 2016-08-22 DIAGNOSIS — I5023 Acute on chronic systolic (congestive) heart failure: Secondary | ICD-10-CM | POA: Diagnosis not present

## 2016-08-22 DIAGNOSIS — F419 Anxiety disorder, unspecified: Secondary | ICD-10-CM | POA: Diagnosis not present

## 2016-08-22 DIAGNOSIS — M1990 Unspecified osteoarthritis, unspecified site: Secondary | ICD-10-CM | POA: Diagnosis not present

## 2016-08-22 DIAGNOSIS — I110 Hypertensive heart disease with heart failure: Secondary | ICD-10-CM | POA: Diagnosis not present

## 2016-08-22 NOTE — Telephone Encounter (Signed)
Spoke with patient and advised results, spoke with [facility] and advised results

## 2016-08-22 NOTE — Patient Outreach (Signed)
[facility]) Care Management [place], Care Coordination telephone outreach 08/22/2016  CARMINA SIEMEN [birth year] [id number]  Successful incoming phone call from "[facility]" at Dr. Cyndi Lennert office [phone]) re: Jennye Bleyer Brakeis an 75 y.o.femalewell known to [place] CMfor multiple hospitalizations and safety concerns. [name] most recently admitted to the hospital September 18-20, 2017,after experiencing syncopal episode/ AMS at home. Patient was discharged to [name] for rehabiliation, and was subsequently discharged home from [facility] on July 05, 2016 with home health [facility]) services.  Today, "Dee" at Dr. Cyndi Lennert office confirmed that patient should be taking Lopressor 25 mg BID, as it is currently ordered.  "Karena Addison" confirmed that she will call patient to clarify with her as well.  Plan:  Will continue to reinforce medication adherence/ compliance with patient during [place] outreach.  I appreciate call back and clarification from Dr. Cyndi Lennert staff,  [name], RN, BSN, [place] [facility] Care Management  [phone]

## 2016-08-23 ENCOUNTER — Ambulatory Visit (INDEPENDENT_AMBULATORY_CARE_PROVIDER_SITE_OTHER): Payer: Medicare Other

## 2016-08-23 VITALS — BP 138/80 | HR 77 | Temp 97.4°F | Ht 64.0 in | Wt 167.0 lb

## 2016-08-23 DIAGNOSIS — I5023 Acute on chronic systolic (congestive) heart failure: Secondary | ICD-10-CM | POA: Diagnosis not present

## 2016-08-23 DIAGNOSIS — M1990 Unspecified osteoarthritis, unspecified site: Secondary | ICD-10-CM | POA: Diagnosis not present

## 2016-08-23 DIAGNOSIS — Z0000 Encounter for general adult medical examination without abnormal findings: Secondary | ICD-10-CM

## 2016-08-23 DIAGNOSIS — I872 Venous insufficiency (chronic) (peripheral): Secondary | ICD-10-CM | POA: Diagnosis not present

## 2016-08-23 DIAGNOSIS — F419 Anxiety disorder, unspecified: Secondary | ICD-10-CM | POA: Diagnosis not present

## 2016-08-23 DIAGNOSIS — G20 Parkinson's disease: Secondary | ICD-10-CM | POA: Diagnosis not present

## 2016-08-23 DIAGNOSIS — I110 Hypertensive heart disease with heart failure: Secondary | ICD-10-CM | POA: Diagnosis not present

## 2016-08-23 NOTE — Progress Notes (Signed)
Subjective:   [name] is a 75 y.o. female who presents for an Initial Medicare Annual Wellness Visit.  Review of Systems    Cardiac Risk Factors include: advanced age (>76men, >74 women);family history of premature cardiovascular disease;hypertension;sedentary lifestyle     Objective:    Today's Vitals   08/23/16 1328  BP: 138/80  Pulse: 77  Temp: 97.4 F (36.3 C)  TempSrc: Oral  SpO2: 98%  Weight: 167 lb (75.8 kg)  Height: 5\' 4"  (1.626 m)   Body mass index is 28.67 kg/m.   Current Medications (verified) Outpatient Encounter Prescriptions as of 08/23/2016  Medication Sig  . aspirin EC 81 MG tablet Take 81 mg by mouth daily.  . carbidopa-levodopa (SINEMET IR) 25-100 MG tablet Take 1 at 6a, 1 1/2 pills at 9a, 1 at 1p, 1 1/2 pills at 3p, 1 at 6pm, 1 1/2 pills at 9pm  . docusate sodium (COLACE) 100 MG capsule Take 100 mg by mouth daily. Reported on 02/28/2016  . ferrous sulfate 325 (65 FE) MG EC tablet Take 1 tablet (325 mg total) by mouth daily.  . furosemide (LASIX) 40 MG tablet Take 1 tablet (40 mg total) by mouth daily.  Marland Kitchen latanoprost (XALATAN) 0.005 % ophthalmic solution Place 1 drop into the left eye daily at 6 PM.   . LORazepam (ATIVAN) 0.5 MG tablet Take 1 tablet (0.5 mg total) by mouth 2 (two) times daily as needed for anxiety.  . metoprolol tartrate (LOPRESSOR) 25 MG tablet Take 1 tablet (25 mg total) by mouth 2 (two) times daily.  . Multiple Vitamins-Minerals (MULTIVITAMIN WITH MINERALS) tablet Take 1 tablet by mouth daily.  Marland Kitchen nystatin cream (MYCOSTATIN) Apply 1 application topically 2 (two) times daily.  Vladimir Faster Glycol-Propyl Glycol (SYSTANE OP) Place 1 drop into both eyes daily.   . pramipexole (MIRAPEX) 1 MG tablet Take 1 tablet (1 mg total) by mouth 3 (three) times daily.  Marland Kitchen triamcinolone cream (KENALOG) 0.1 % Apply 1 application topically 2 (two) times daily.  . [DISCONTINUED] Cholecalciferol (VITAMIN D) 2000 units CAPS Take 2,000 Units by mouth daily.  Reported on 04/10/2016   No facility-administered encounter medications on file as of 08/23/2016.     Allergies (verified) Codeine and Penicillins   History: Past Medical History:  Diagnosis Date  . Acute on chronic systolic heart failure ([place])   . Anemia   . Anxiety   . Arthritis   . Cataract left  . Cellulitis and abscess of leg, except foot   . Cellulitis of lower leg 12/24/2011  . CHF (congestive heart failure) ([place])   . Chronic bronchitis ([place])    "usually get it q yr"  . Chronic systolic heart failure ([place])   . GERD (gastroesophageal reflux disease)   . Glaucoma left  . Helicobacter pylori (H. pylori)   . History of bleeding peptic ulcer   . History of blood transfusion    "don't remember why"  . Hypercholesterolemia   . Hypertension   . Hypotension, unspecified   . Hypothyroidism   . Parkinson's disease   . Pneumonia   . Sleep apnea    "had test years ago; insurance wouldn't pay for mask so I never had one" (04/27/2015)  . Stroke [facility]) X 3   "that  was the reason I got Parkinson's"   Past Surgical History:  Procedure Laterality Date  . BACK SURGERY    . ESOPHAGOGASTRODUODENOSCOPY  09/26/2011   Procedure: ESOPHAGOGASTRODUODENOSCOPY (EGD);  Surgeon: Zenovia Jarred, MD;  Location:  [facility];  Service: Gastroenterology;  Laterality: N/A;  To be done at bedside.  . ESOPHAGOGASTRODUODENOSCOPY N/A 09/30/2013   Procedure: ESOPHAGOGASTRODUODENOSCOPY (EGD);  Surgeon: [name], MD;  Location: [facility];  Service: Endoscopy;  Laterality: N/A;  . ESOPHAGOGASTRODUODENOSCOPY N/A 11/25/2013   Procedure: ESOPHAGOGASTRODUODENOSCOPY (EGD);  Surgeon: [name], MD;  Location: [facility];  Service: Endoscopy;  Laterality: N/A;  . HEMIARTHROPLASTY HIP Right 09/24/2007   Archie Endo 01/23/2011  . KYPHOPLASTY  06/2009   T12/notes 07/17/2009  . NISSEN FUNDOPLICATION     "had OR for GERD"  . TENDON REPAIR     Gluteus medius Archie Endo 01/23/2011  . THYROIDECTOMY    . TONSILLECTOMY      Family History  Problem Relation Age of Onset  . GER disease Mother   . Heart disease Father    Social History   Occupational History  .      Retired   Social History Main Topics  . Smoking status: Never Smoker  . Smokeless tobacco: Never Used  . Alcohol use No  . Drug use: No  . Sexual activity: No    Tobacco Counseling Counseling given: Not Answered   Activities of Daily Living In your present state of health, do you have any difficulty performing the following activities: 08/23/2016 07/09/2016  Hearing? N N  Vision? Y N  Difficulty concentrating or making decisions? Y N  Walking or climbing stairs? Y Y  Dressing or bathing? Y Y  Doing errands, shopping? [name]  Preparing Food and eating ? Y Y  Using the Toilet? N N  In the past six months, have you accidently leaked urine? Y N  Do you have problems with loss of bowel control? Y N  Managing your Medications? Y N  Managing your Finances? Y N  Housekeeping or managing your Housekeeping? [name]  Some recent data might be hidden    Immunizations and Health Maintenance Immunization History  Administered Date(s) Administered  . Influenza Whole 06/18/2012  . Influenza,inj,Quad PF,36+ Mos 09/14/2013, 07/21/2014, 07/06/2015, 06/06/2016  . PPD Test 06/12/2016  . Pneumococcal Conjugate-13 12/23/2014  . Pneumococcal Polysaccharide-23 09/24/2007   Health Maintenance Due  Topic Date Due  . DEXA SCAN  06/06/2005  . MAMMOGRAM  11/22/2011    Patient Care Team: Gayland Curry, DO as PCP - General (Geriatric Medicine) Greg Cutter, LCSW as [place] Management (Licensed Clinical Social Worker) Knox Royalty, RN as [place], MD as Consulting Physician (Ophthalmology) Kerri Perches, MD as Referring Physician (Ophthalmology)  Indicate any recent Medical Services you may have received from other than [facility] providers in the past year (date may be  approximate).     Assessment:   This is a routine wellness examination for [facility]  Hearing/Vision screen Hearing Screening Comments: Pt states she has never had a hearing screen. Has no complaints.  Vision Screening Comments: Last eye exam done 08/20/16 with Dr. Yisroel Ramming in [place].  Dietary issues and exercise activities discussed: Current Exercise Habits: The patient does not participate in regular exercise at present, Exercise limited by: neurologic condition(s);orthopedic condition(s)  Goals    . Reduce salt intake to 2 grams per day or less          Starting 08/23/16, I will maintain my reduced sodium diet.       Depression Screen PHQ 2/9 Scores 08/23/2016 08/12/2016 07/09/2016 06/06/2016 05/14/2016 03/29/2016 03/19/2016  PHQ - 2 Score 0 0 0 0  0 0 0    Fall Risk Fall Risk  08/23/2016 08/21/2016 08/12/2016 08/09/2016 08/06/2016  Falls in the past year? No (No Data) No (No Data) (No Data)  Number falls in past yr: - - - - -  Injury with Fall? - - - - -  Risk Factor Category  - - - - -  Risk for fall due to : Impaired balance/gait;Impaired mobility - - - -  Follow up - - - - -    Screening Tests Health Maintenance  Topic Date Due  . DEXA SCAN  06/06/2005  . MAMMOGRAM  11/22/2011  . ZOSTAVAX  09/22/2017 (Originally 06/06/2000)  . TETANUS/TDAP  09/22/2017 (Originally 06/07/1960)  . INFLUENZA VACCINE  Completed  . PNA vac Low Risk Adult  Completed      Plan:    I have personally reviewed and addressed the Medicare Annual Wellness questionnaire and have noted the following in the patient's chart:  A. Medical and social history B. Use of alcohol, tobacco or illicit drugs  C. Current medications and supplements D. Functional ability and status E.  Nutritional status F.  Physical activity G. Advance directives H. List of other physicians I.  Hospitalizations, surgeries, and ER visits in previous 12 months J.  [place] to include hearing, vision, cognitive,  depression L. Referrals and appointments - none  In addition, I have reviewed and discussed with patient certain preventive protocols, quality metrics, and best practice recommendations. A written personalized care plan for preventive services as well as general preventive health recommendations were provided to patient.  See attached scanned questionnaire for additional information.   Signed,   Allyn Kenner, LPN Health Advisor  I reviewed health advisor's note, was available for consultation and agree with the assessment and plan as written. Pt has an appt Monday now for me to evaluate her sore area in her throat.  We had also called her earlier and advised she should take the lopressor twice a day.      Denika Krone L. Shafiq Larch, D.O. [facility] [address], [place] [address] Cell Phone (Mon-Fri 8am-5pm):  [phone] On Call:  [phone] & follow prompts after 5pm & weekends Office Phone:  [phone] Office Fax:  [phone]

## 2016-08-23 NOTE — Patient Instructions (Addendum)
Ms. Akopyan , Thank you for taking time to come for your Medicare Wellness Visit. I appreciate your ongoing commitment to your health goals. Please review the following plan we discussed and let me know if I can assist you in the future.   These are the goals we discussed: Goals    . Reduce salt intake to 2 grams per day or less          Starting 08/23/16, I will maintain my reduced sodium diet.        This is a list of the screening recommended for you and due dates:  Health Maintenance  Topic Date Due  . DEXA scan (bone density measurement)  06/06/2005  . Mammogram  11/22/2011  . Shingles Vaccine  09/22/2017*  . Tetanus Vaccine  09/22/2017*  . Flu Shot  Completed  . Pneumonia vaccines  Completed  *Topic was postponed. The date shown is not the original due date.  Preventive Care for Adults  A healthy lifestyle and preventive care can promote health and wellness. Preventive health guidelines for adults include the following key practices.  . A routine yearly physical is a good way to check with your health care provider about your health and preventive screening. It is a chance to share any concerns and updates on your health and to receive a thorough exam.  . Visit your dentist for a routine exam and preventive care every 6 months. Brush your teeth twice a day and floss once a day. Good oral hygiene prevents tooth decay and gum disease.  . The frequency of eye exams is based on your age, health, family medical history, use  of contact lenses, and other factors. Follow your health care provider's ecommendations for frequency of eye exams.  . Eat a healthy diet. Foods like vegetables, fruits, whole grains, low-fat dairy products, and lean protein foods contain the nutrients you need without too many calories. Decrease your intake of foods high in solid fats, added sugars, and salt. Eat the right amount of calories for you. Get information about a proper diet from your health care  provider, if necessary.  . Regular physical exercise is one of the most important things you can do for your health. Most adults should get at least 150 minutes of moderate-intensity exercise (any activity that increases your heart rate and causes you to sweat) each week. In addition, most adults need muscle-strengthening exercises on 2 or more days a week.  Silver Sneakers may be a benefit available to you. To determine eligibility, you may visit the website: www.silversneakers.com or contact program at 918-552-5438 Mon-Fri between 8AM-8PM.   . Maintain a healthy weight. The body mass index (BMI) is a screening tool to identify possible weight problems. It provides an estimate of body fat based on height and weight. Your health care provider can find your BMI and can help you achieve or maintain a healthy weight.   For adults 20 years and older: ? A BMI below 18.5 is considered underweight. ? A BMI of 18.5 to 24.9 is normal. ? A BMI of 25 to 29.9 is considered overweight. ? A BMI of 30 and above is considered obese.   . Maintain normal blood lipids and cholesterol levels by exercising and minimizing your intake of saturated fat. Eat a balanced diet with plenty of fruit and vegetables. Blood tests for lipids and cholesterol should begin at age 81 and be repeated every 5 years. If your lipid or cholesterol levels are high, you are  over 85, or you are at high risk for heart disease, you may need your cholesterol levels checked more frequently. Ongoing high lipid and cholesterol levels should be treated with medicines if diet and exercise are not working.  . If you smoke, find out from your health care provider how to quit. If you do not use tobacco, please do not start.  . If you choose to drink alcohol, please do not consume more than 2 drinks per day. One drink is considered to be 12 ounces (355 mL) of beer, 5 ounces (148 mL) of wine, or 1.5 ounces (44 mL) of liquor.  . If you are 58-79 years  old, ask your health care provider if you should take aspirin to prevent strokes.  . Use sunscreen. Apply sunscreen liberally and repeatedly throughout the day. You should seek shade when your shadow is shorter than you. Protect yourself by wearing long sleeves, pants, a wide-brimmed hat, and sunglasses year round, whenever you are outdoors.  . Once a month, do a whole body skin exam, using a mirror to look at the skin on your back. Tell your health care provider of new moles, moles that have irregular borders, moles that are larger than a pencil eraser, or moles that have changed in shape or color.

## 2016-08-23 NOTE — Progress Notes (Signed)
Quick Notes   Health Maintenance:    Pt to check with pharmacy about OOP cost for Shingles and TDAP; Pt states she has aged out of MMG. She states she is unsure of last Dexa.   Abnormal Screen:  MMSE-25/30; Failed Clock test   Patient Concerns:  Pt has had pain on the left side of her cheek and says it radiates down into her throat. Pain has occurred since last visit (08/12/16). She wears a cotton ball in her ear to block the air from getting inside b/c she says it causes the pain to worsen. Will schedule pt to see you sooner than 09/12/16 for this pain.     Nurse Concerns:   Jaw/Cheek pain. I received an email from the RN @ [facility], who was doing pt's home visits, stating that the pt. was not taking her Lopressor correctly. She was only taking it once a day vs the BID order. Spoke w/ pt about this today and pt verbalized she would start taking it BID.

## 2016-08-24 DIAGNOSIS — I5023 Acute on chronic systolic (congestive) heart failure: Secondary | ICD-10-CM | POA: Diagnosis not present

## 2016-08-24 DIAGNOSIS — M1990 Unspecified osteoarthritis, unspecified site: Secondary | ICD-10-CM | POA: Diagnosis not present

## 2016-08-24 DIAGNOSIS — G20 Parkinson's disease: Secondary | ICD-10-CM | POA: Diagnosis not present

## 2016-08-24 DIAGNOSIS — F419 Anxiety disorder, unspecified: Secondary | ICD-10-CM | POA: Diagnosis not present

## 2016-08-24 DIAGNOSIS — I872 Venous insufficiency (chronic) (peripheral): Secondary | ICD-10-CM | POA: Diagnosis not present

## 2016-08-24 DIAGNOSIS — I110 Hypertensive heart disease with heart failure: Secondary | ICD-10-CM | POA: Diagnosis not present

## 2016-08-26 ENCOUNTER — Ambulatory Visit (INDEPENDENT_AMBULATORY_CARE_PROVIDER_SITE_OTHER): Payer: Medicare Other | Admitting: Internal Medicine

## 2016-08-26 ENCOUNTER — Encounter: Payer: Self-pay | Admitting: Internal Medicine

## 2016-08-26 VITALS — BP 120/70 | HR 70 | Temp 98.0°F | Wt 170.0 lb

## 2016-08-26 DIAGNOSIS — I872 Venous insufficiency (chronic) (peripheral): Secondary | ICD-10-CM | POA: Diagnosis not present

## 2016-08-26 DIAGNOSIS — H6502 Acute serous otitis media, left ear: Secondary | ICD-10-CM

## 2016-08-26 DIAGNOSIS — S81819D Laceration without foreign body, unspecified lower leg, subsequent encounter: Secondary | ICD-10-CM

## 2016-08-26 DIAGNOSIS — K047 Periapical abscess without sinus: Secondary | ICD-10-CM

## 2016-08-26 MED ORDER — DOXYCYCLINE HYCLATE 100 MG PO TABS: 100.0000 mg | ORAL_TABLET | Freq: Two times a day (BID) | ORAL | 0 refills | Status: DC

## 2016-08-26 NOTE — Progress Notes (Signed)
Location:  [facility] clinic Provider: Shaylie Eklund L. Mariea Clonts, D.O., C.M.D.  Code Status: DNR Goals of Care:  Advanced Directives 08/23/2016  Does Patient Have a Medical Advance Directive? Yes  Type of Advance Directive [place]  Does patient want to make changes to medical advance directive? -  Copy of [place] in Chart? Yes  Would patient like information on creating a medical advance directive? -  Pre-existing out of facility DNR order (yellow form or pink MOST form) -     Chief Complaint  Patient presents with  . Acute Visit    jaw and throat pain    [name] seen today for an acute visit for jaw and throat pain on the left side.  She reports it is improved from last week, but still there.  There is a foul odor.  She reports pain swallowing in her throat and when she opens and closes her jaw.  Her dentition is poor and she has not seen a dentist in a long time.    Her right leg skin tear remains--still having a small amt of yellow drainage.  No odor.  Past Medical History:  Diagnosis Date  . Acute on chronic systolic heart failure ([place])   . Anemia   . Anxiety   . Arthritis   . Cataract left  . Cellulitis and abscess of leg, except foot   . Cellulitis of lower leg 12/24/2011  . CHF (congestive heart failure) ([place])   . Chronic bronchitis ([place])    "usually get it q yr"  . Chronic systolic heart failure ([place])   . GERD (gastroesophageal reflux disease)   . Glaucoma left  . Helicobacter pylori (H. pylori)   . History of bleeding peptic ulcer   . History of blood transfusion    "don't remember why"  . Hypercholesterolemia   . Hypertension   . Hypotension, unspecified   . Hypothyroidism   . Parkinson's disease   . Pneumonia   . Sleep apnea    "had test years ago; insurance wouldn't pay for mask so I never had one" (04/27/2015)  . Stroke [facility]) X 3   "that  was the reason I got Parkinson's"    Past Surgical History:    Procedure Laterality Date  . BACK SURGERY    . ESOPHAGOGASTRODUODENOSCOPY  09/26/2011   Procedure: ESOPHAGOGASTRODUODENOSCOPY (EGD);  Surgeon: Zenovia Jarred, MD;  Location: [facility] ENDOSCOPY;  Service: Gastroenterology;  Laterality: N/A;  To be done at bedside.  . ESOPHAGOGASTRODUODENOSCOPY N/A 09/30/2013   Procedure: ESOPHAGOGASTRODUODENOSCOPY (EGD);  Surgeon: [name], MD;  Location: [place];  Service: Endoscopy;  Laterality: N/A;  . ESOPHAGOGASTRODUODENOSCOPY N/A 11/25/2013   Procedure: ESOPHAGOGASTRODUODENOSCOPY (EGD);  Surgeon: [name], MD;  Location: [facility] ENDOSCOPY;  Service: Endoscopy;  Laterality: N/A;  . HEMIARTHROPLASTY HIP Right 09/24/2007   Archie Endo 01/23/2011  . KYPHOPLASTY  06/2009   T12/notes 07/17/2009  . NISSEN FUNDOPLICATION     "had OR for GERD"  . TENDON REPAIR     Gluteus medius [facility] 01/23/2011  . THYROIDECTOMY    . TONSILLECTOMY      Allergies  Allergen Reactions  . Codeine Other (See Comments)    Unknown allergic reaction  . Penicillins Other (See Comments)    Has patient had a PCN reaction causing immediate rash, facial/tongue/throat swelling, SOB or lightheadedness with hypotension: No Has patient had a PCN reaction causing severe rash involving mucus membranes or skin necrosis: No Has  patient had a PCN reaction that required hospitalization No Has patient had a PCN reaction occurring within the last 10 years: No If all of the above answers are "NO", then may proceed with Cephalosporin use. Pt passed out in doctor's office after penicillin dose      Medication List       Accurate as of 08/26/16 12:22 PM. Always use your most recent med list.          aspirin EC 81 MG tablet Take 81 mg by mouth daily.   carbidopa-levodopa 25-100 MG tablet Commonly known as:  SINEMET IR Take 1 at 6a, 1 1/2 pills at 9a, 1 at 1p, 1 1/2 pills at 3p, 1 at 6pm, 1 1/2 pills at 9pm   docusate sodium 100 MG capsule Commonly known as:  COLACE Take 100 mg by mouth daily.  Reported on 02/28/2016   ferrous sulfate 325 (65 FE) MG EC tablet Take 1 tablet (325 mg total) by mouth daily.   furosemide 40 MG tablet Commonly known as:  LASIX Take 1 tablet (40 mg total) by mouth daily.   latanoprost 0.005 % ophthalmic solution Commonly known as:  XALATAN Place 1 drop into the left eye daily at 6 PM.   LORazepam 0.5 MG tablet Commonly known as:  ATIVAN Take 1 tablet (0.5 mg total) by mouth 2 (two) times daily as needed for anxiety.   metoprolol tartrate 25 MG tablet Commonly known as:  LOPRESSOR Take 1 tablet (25 mg total) by mouth 2 (two) times daily.   multivitamin with minerals tablet Take 1 tablet by mouth daily.   nystatin cream Commonly known as:  MYCOSTATIN Apply 1 application topically 2 (two) times daily.   pramipexole 1 MG tablet Commonly known as:  MIRAPEX Take 1 tablet (1 mg total) by mouth 3 (three) times daily.   SYSTANE OP Place 1 drop into both eyes daily.   triamcinolone cream 0.1 % Commonly known as:  KENALOG Apply 1 application topically 2 (two) times daily.       Review of Systems:  Review of Systems  Constitutional: Negative for chills and fever.  HENT: Positive for ear pain and sore throat.        Left jaw pain  Respiratory: Negative for cough and shortness of breath.   Cardiovascular: Negative for chest pain and palpitations.  Gastrointestinal: Negative for abdominal pain.  Genitourinary: Negative for dysuria.  Musculoskeletal: Negative for falls.  Skin:       Skin tear on right leg still with small amt of drainage on telfa, is then wrapped  Neurological: Positive for tremors.  Psychiatric/Behavioral: Positive for memory loss.    Health Maintenance  Topic Date Due  . DEXA SCAN  06/06/2005  . ZOSTAVAX  09/22/2017 (Originally 06/06/2000)  . TETANUS/TDAP  09/22/2017 (Originally 06/07/1960)  . INFLUENZA VACCINE  Completed  . PNA vac Low Risk Adult  Completed    Physical Exam: Vitals:   08/26/16 1159  BP: 120/70    Pulse: 70  Temp: 98 F (36.7 C)  TempSrc: Oral  SpO2: 98%  Weight: 170 lb (77.1 kg)   Body mass index is 29.18 kg/m. Physical Exam  Constitutional: She is oriented to person, place, and time. She appears well-developed and well-nourished. No distress.  HENT:  Right Ear: External ear normal.  Left Ear: External ear normal.  Nose: Nose normal.  Mouth/Throat: Oropharynx is clear and moist. No oropharyngeal exudate.  Swollen area in left posterior mandible area; red and caries present  in teeth; also has a foul smell today; no cervical adenopathy; left ear with erythema of canal and tenderness during exam  Eyes: Conjunctivae are normal.  Neck: Neck supple.  Cardiovascular: Normal rate, regular rhythm, normal heart sounds and intact distal pulses.   Pulmonary/Chest: Effort normal and breath sounds normal. No respiratory distress.  Abdominal: Soft. Bowel sounds are normal.  Musculoskeletal: Normal range of motion.  Lymphadenopathy:    She has no cervical adenopathy.  Neurological: She is alert and oriented to person, place, and time. She exhibits abnormal muscle tone.  tremor  Skin: Skin is warm and dry. Capillary refill takes less than 2 seconds.  Bilateral legs pink, more edematous again today and some increased drainage from the right; skin tear on right with small amt of yellow drainage on dressing  Psychiatric: She has a normal mood and affect.    Labs reviewed: Basic Metabolic Panel:  Recent Labs  05/25/16 1134  05/27/16 0316 05/29/16 0840 05/31/16 0325  06/10/16 0957 06/11/16 0419 06/12/16 0627  NA  --   < > 139 137 138  < > 143 143 141  K  --   < > 4.1 3.6 4.1  < > 3.9 4.0 3.6  CL  --   < > 102 100* 97*  < > 108 107 108  CO2  --   < > 32 25 29  < > 24 29 27   GLUCOSE  --   < > 93 158* 131*  < > 125* 103* 115*  BUN  --   < > 23* 28* 38*  < > 35* 20 18  CREATININE  --   < > 1.00 1.06* 1.15*  < > 0.96 0.84 0.83  CALCIUM  --   < > 9.1 8.8* 9.3  < > 9.5 8.7* 8.8*  MG   --   --  2.2 2.5* 2.6*  --   --   --   --   TSH 0.762  --   --   --   --   --   --   --   --   < > = values in this interval not displayed. Liver Function Tests:  Recent Labs  06/06/16 0935 06/10/16 0957  AST 16 22  ALT 7 13*  ALKPHOS 66 63  BILITOT 0.5 0.5  PROT 6.4 7.5  ALBUMIN 3.7 3.6   No results for input(s): LIPASE, AMYLASE in the last 8760 hours. No results for input(s): AMMONIA in the last 8760 hours. CBC:  Recent Labs  03/11/16 1522  06/06/16 0935 06/10/16 0957 06/11/16 0419 06/12/16 0627  WBC 7.5  < > 11.6* 7.1 7.7 10.0  NEUTROABS 5.3  --  9,628* 4.9  --   --   HGB 10.1*  < > 10.5* 11.8* 10.0* 10.3*  HCT 33.8*  < > 32.2* 39.3 33.0* 34.2*  MCV 81.8  < > 73.5* 79.6 79.3 79.2  PLT 184  < > 167 190 192 183  < > = values in this interval not displayed. Lipid Panel: No results for input(s): CHOL, HDL, LDLCALC, TRIG, CHOLHDL, LDLDIRECT in the last 8760 hours. No results found for: HGBA1C   Assessment/Plan 1. Acute serous otitis media of left ear, recurrence not specified - doxycycline (VIBRA-TABS) 100 MG tablet; Take 1 tablet (100 mg total) by mouth 2 (two) times daily.  Dispense: 20 tablet; Refill: 0  2. Dental abscess - unfortunately, allergy to pcn so will try doxycycline (VIBRA-TABS) 100 MG tablet; Take 1 tablet (  100 mg total) by mouth 2 (two) times daily.  Dispense: 20 tablet; Refill: 0 - Ambulatory referral to [facility] for dental f/u due to this and poor overall dentition with multiple missing teeth, caries and broken teeth  3. Skin tear of lower leg without complication, subsequent encounter -will try to cover with mepilex instead so compression hose can be worn during the day--pt doesn't like them but needs them as edema and drainage is worsening again--caregiver said she's put them on her now  4. Chronic venous insufficiency -see #3  Labs/tests ordered:   Orders Placed This Encounter  Procedures  . Ambulatory referral to [facility]     Referral Priority:   Routine    Referral Type:   Consultation    Referral Reason:   Specialty Services Required    Number of Visits Requested:   1   Next appt:  09/12/2016  Madyson Lukach L. Hanadi Stanly, D.O. [facility] [address], [place] [address] Cell Phone (Mon-Fri 8am-5pm):  [phone] On Call:  [phone] & follow prompts after 5pm & weekends Office Phone:  [phone] Office Fax:  [phone]

## 2016-08-26 NOTE — Patient Instructions (Addendum)
Please clean legs every other day. Apply mepilex dressing to skin tear. Then wear your compression hose during the day every day.    Take the 10 day course of doxycycline twice daily for you ear and tooth (causing your jaw and throat pain). Eat yogurt while taking the antibiotics.

## 2016-08-27 DIAGNOSIS — I872 Venous insufficiency (chronic) (peripheral): Secondary | ICD-10-CM | POA: Diagnosis not present

## 2016-08-27 DIAGNOSIS — I5023 Acute on chronic systolic (congestive) heart failure: Secondary | ICD-10-CM | POA: Diagnosis not present

## 2016-08-27 DIAGNOSIS — I110 Hypertensive heart disease with heart failure: Secondary | ICD-10-CM | POA: Diagnosis not present

## 2016-08-27 DIAGNOSIS — M1990 Unspecified osteoarthritis, unspecified site: Secondary | ICD-10-CM | POA: Diagnosis not present

## 2016-08-27 DIAGNOSIS — G20 Parkinson's disease: Secondary | ICD-10-CM | POA: Diagnosis not present

## 2016-08-27 DIAGNOSIS — F419 Anxiety disorder, unspecified: Secondary | ICD-10-CM | POA: Diagnosis not present

## 2016-08-28 ENCOUNTER — Other Ambulatory Visit: Payer: Self-pay | Admitting: Licensed Clinical Social Worker

## 2016-08-28 NOTE — Patient Outreach (Signed)
[facility]) Care Management  08/28/2016  [name] [birth year] [id number]   Assessment- CSW completed outreach to patient and patient answered successfully. She reports that she is feeling well today. She states that she continues to experience jaw and throat pain. She reports that she visited her PCP and that her PCP will be assisting her with finding a Pharmacist, community. CSW provided brief education on dental resources in [place]. Patient states that her brother continues to fill her medications. CSW questions if she has been using new pill box that [facility] provided her but patient declined. [facility] RNCM continues to work with patient on medication adherence.   Plan-CSW will continue to provide social work assistance and make outreach to patient within three weeks.  [name], BSW, MSW, [email or web address] Phone: [phone] Fax: [phone]

## 2016-08-29 DIAGNOSIS — G20 Parkinson's disease: Secondary | ICD-10-CM | POA: Diagnosis not present

## 2016-08-29 DIAGNOSIS — I110 Hypertensive heart disease with heart failure: Secondary | ICD-10-CM | POA: Diagnosis not present

## 2016-08-29 DIAGNOSIS — F419 Anxiety disorder, unspecified: Secondary | ICD-10-CM | POA: Diagnosis not present

## 2016-08-29 DIAGNOSIS — I5023 Acute on chronic systolic (congestive) heart failure: Secondary | ICD-10-CM | POA: Diagnosis not present

## 2016-08-29 DIAGNOSIS — I872 Venous insufficiency (chronic) (peripheral): Secondary | ICD-10-CM | POA: Diagnosis not present

## 2016-08-29 DIAGNOSIS — M1990 Unspecified osteoarthritis, unspecified site: Secondary | ICD-10-CM | POA: Diagnosis not present

## 2016-08-30 ENCOUNTER — Other Ambulatory Visit: Payer: Self-pay | Admitting: Pharmacist

## 2016-08-30 DIAGNOSIS — F419 Anxiety disorder, unspecified: Secondary | ICD-10-CM | POA: Diagnosis not present

## 2016-08-30 DIAGNOSIS — M1990 Unspecified osteoarthritis, unspecified site: Secondary | ICD-10-CM | POA: Diagnosis not present

## 2016-08-30 DIAGNOSIS — I110 Hypertensive heart disease with heart failure: Secondary | ICD-10-CM | POA: Diagnosis not present

## 2016-08-30 DIAGNOSIS — I872 Venous insufficiency (chronic) (peripheral): Secondary | ICD-10-CM | POA: Diagnosis not present

## 2016-08-30 DIAGNOSIS — G20 Parkinson's disease: Secondary | ICD-10-CM | POA: Diagnosis not present

## 2016-08-30 DIAGNOSIS — I5023 Acute on chronic systolic (congestive) heart failure: Secondary | ICD-10-CM | POA: Diagnosis not present

## 2016-08-30 NOTE — Patient Outreach (Signed)
[facility]) Care Management  08/30/2016  [name] 04-Jul-1941 [id number]  Patient was referred to [place] by [place] for evaluation of medication adherences aids, such as blister packaging.  [facility] Pharmacist is familiar with patient from previous work and she was not interested in blister packaging at that time.    Plan:  Discussed case with [place] and will complete a co-home visit with [name] next week.    Karrie Meres, PharmD, [place] [phone]

## 2016-09-01 IMAGING — CR DG PORTABLE PELVIS
1 series · 1 of 1 positions shown · non-contrast
Comparison: None.

CLINICAL DATA: Fall

EXAM:
PORTABLE PELVIS 1-2 VIEWS

[AP]
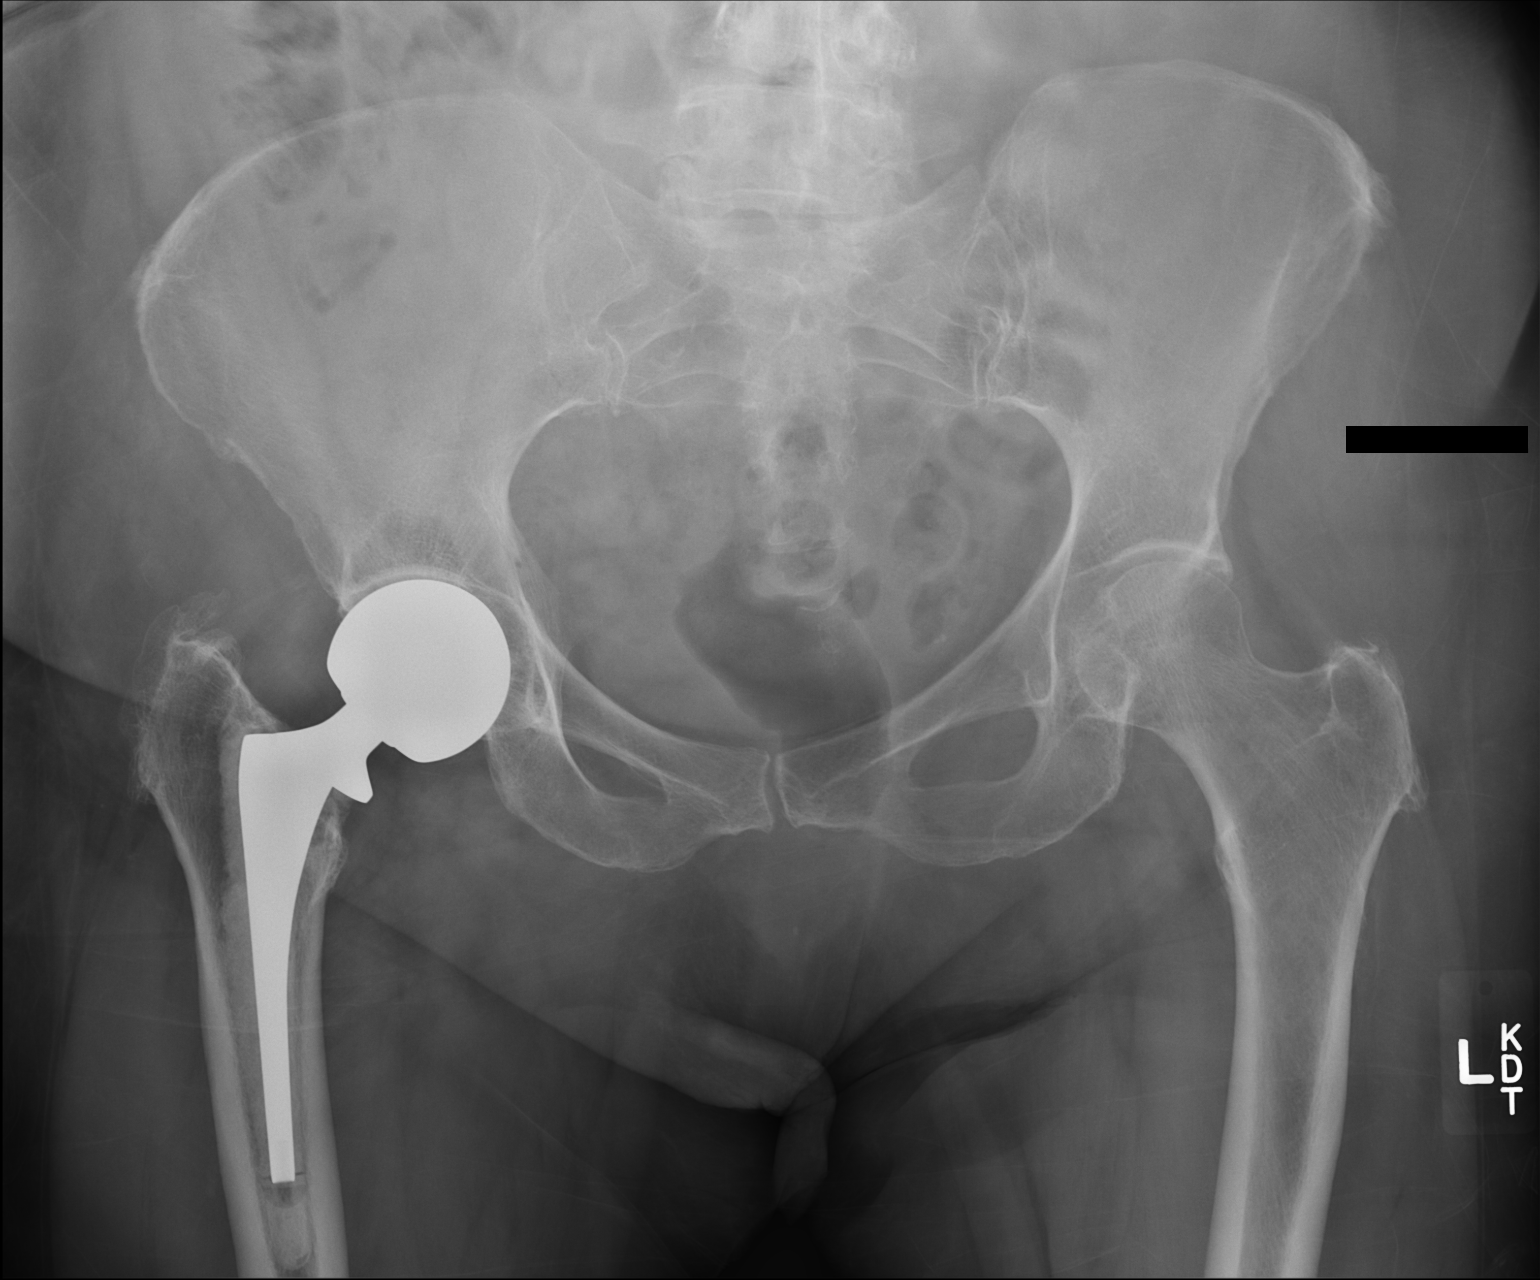

[1 of 1 positions shown; findings below may reference images not displayed]

FINDINGS: Prior right hip replacement. No acute bony abnormality.
Specifically, no fracture, subluxation, or dislocation. Soft tissues
are intact.
IMPRESSION: No acute bony abnormality.

## 2016-09-01 IMAGING — CR DG CHEST 1V PORT
1 series · 1 of 1 positions shown · non-contrast
Comparison: 07/30/2013

CLINICAL DATA: Evaluate pneumonia.  History of hypertension.

EXAM:
PORTABLE CHEST - 1 VIEW

[AP]
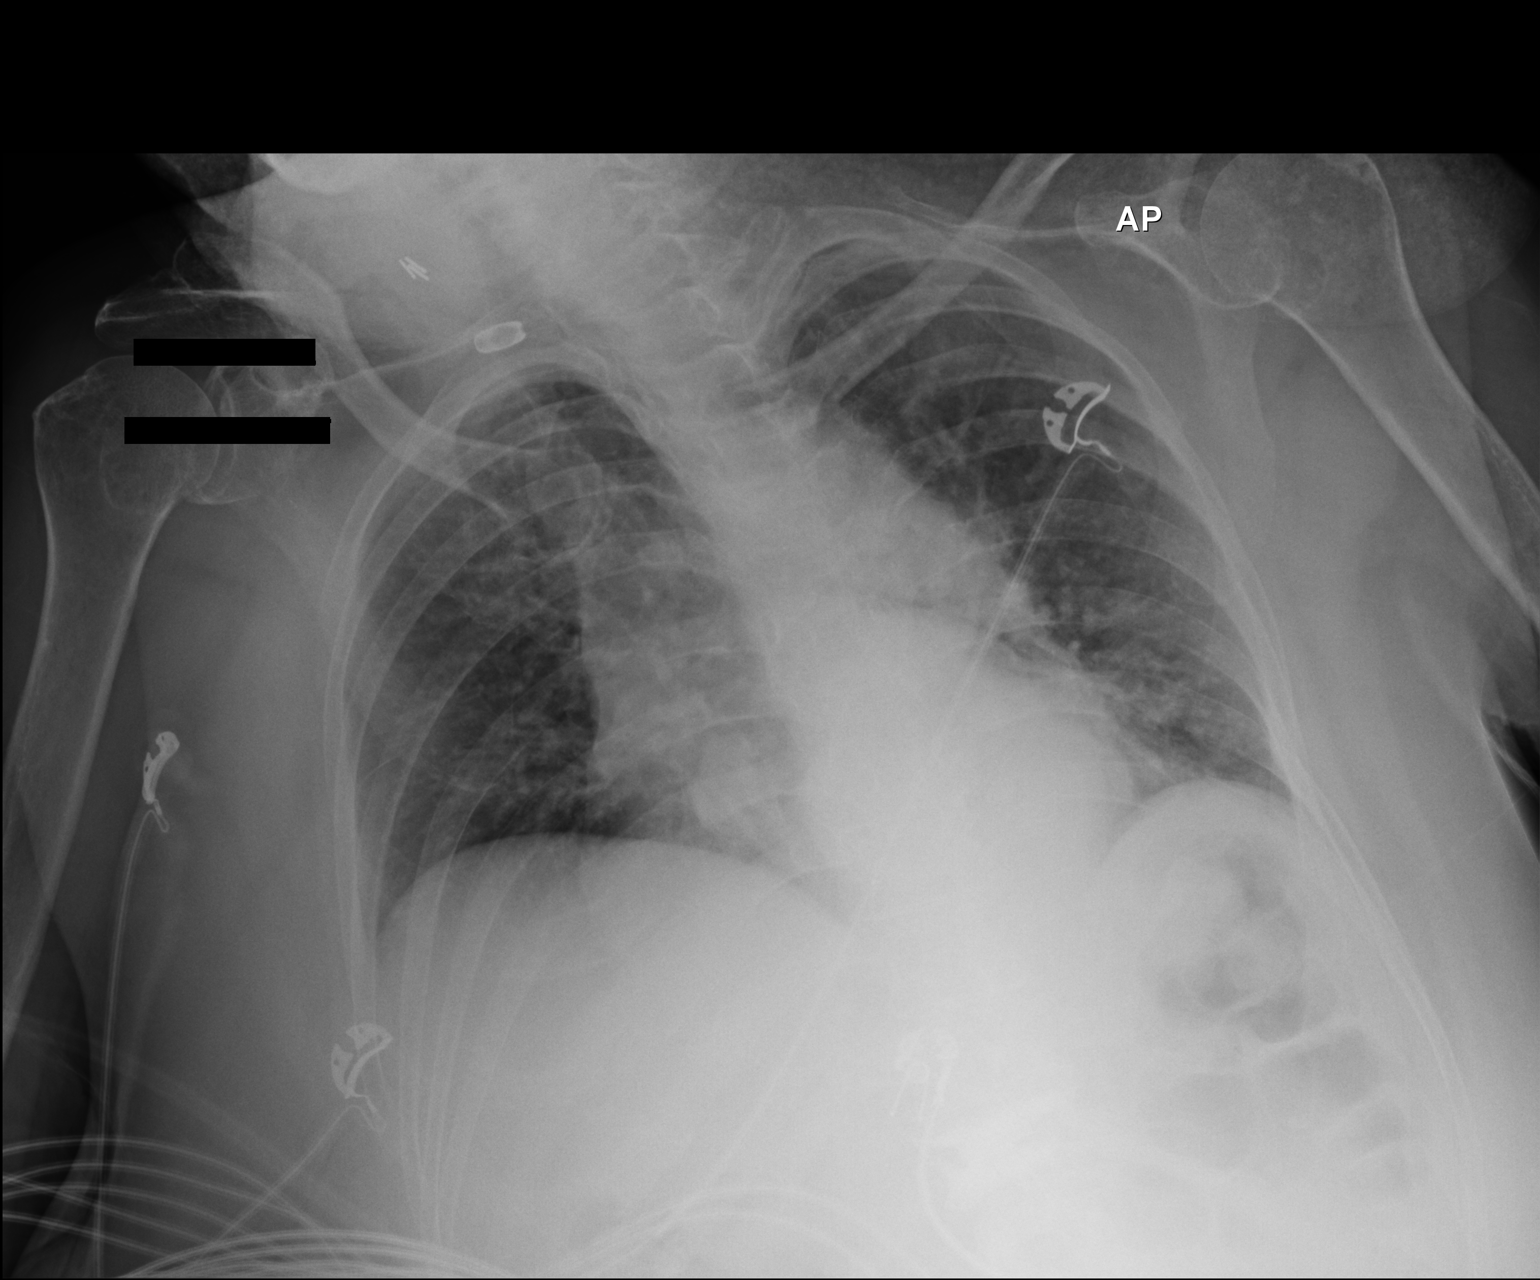

[1 of 1 positions shown; findings below may reference images not displayed]

FINDINGS: Low lung volumes. No confluent airspace opacities. Heart is
borderline in size, accentuated by the low volumes and portable
nature of the study. No effusions. No acute bony abnormality.
IMPRESSION: Low lung volumes.  No acute findings.

## 2016-09-02 ENCOUNTER — Other Ambulatory Visit: Payer: Self-pay | Admitting: Pharmacist

## 2016-09-02 ENCOUNTER — Encounter: Payer: Self-pay | Admitting: *Deleted

## 2016-09-02 ENCOUNTER — Other Ambulatory Visit: Payer: Self-pay | Admitting: *Deleted
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
## 2016-09-02 NOTE — Patient Outreach (Signed)
[facility]) Care Management  [place]   09/02/2016  DANESHA HOLL Jan 13, 1941 [id number]  Subjective:  Patient was referred to [place] by [place], for evaluation of medication adherence aides.    Home visit completed today in conjunction with [facility] RN Richarda Osmond.    Patient reportedly uses three pill boxes, one she fills with OTCs, the other she typically has filled for 2 weeks worth of medication, including aspirin, ferrous sulfate, and metoprolol tartrate.   Patient reports she keeps her carbidopa/levodopa and pramipexole separate and takes her furosemide at 0600 with carbidopa/levodopa dose.   Per [facility] RN, compliance to the evening dose of metoprolol tartrate has been a concern.    Objective:   Encounter Medications: Outpatient Encounter Prescriptions as of 09/02/2016  Medication Sig  . aspirin EC 81 MG tablet Take 81 mg by mouth daily.  . carbidopa-levodopa (SINEMET IR) 25-100 MG tablet Take 1 at 6a, 1 1/2 pills at 9a, 1 at 1p, 1 1/2 pills at 3p, 1 at 6pm, 1 1/2 pills at 9pm  . docusate sodium (COLACE) 100 MG capsule Take 100 mg by mouth daily. Reported on 02/28/2016  . doxycycline (VIBRA-TABS) 100 MG tablet Take 1 tablet (100 mg total) by mouth 2 (two) times daily.  . ferrous sulfate 325 (65 FE) MG EC tablet Take 1 tablet (325 mg total) by mouth daily.  . furosemide (LASIX) 40 MG tablet Take 1 tablet (40 mg total) by mouth daily.  Marland Kitchen latanoprost (XALATAN) 0.005 % ophthalmic solution Place 1 drop into the left eye daily at 6 PM.   . LORazepam (ATIVAN) 0.5 MG tablet Take 1 tablet (0.5 mg total) by mouth 2 (two) times daily as needed for anxiety.  . metoprolol tartrate (LOPRESSOR) 25 MG tablet Take 1 tablet (25 mg total) by mouth 2 (two) times daily.  . Multiple Vitamins-Minerals (MULTIVITAMIN WITH MINERALS) tablet Take 1 tablet by mouth daily.  Marland Kitchen nystatin cream (MYCOSTATIN) Apply 1 application topically 2 (two) times daily.  Vladimir Faster  Glycol-Propyl Glycol (SYSTANE OP) Place 1 drop into both eyes daily.   . pramipexole (MIRAPEX) 1 MG tablet Take 1 tablet (1 mg total) by mouth 3 (three) times daily.  Marland Kitchen triamcinolone cream (KENALOG) 0.1 % Apply 1 application topically 2 (two) times daily.   No facility-administered encounter medications on file as of 09/02/2016.     Functional Status: In your present state of health, do you have any difficulty performing the following activities: 08/23/2016 07/09/2016  Hearing? N N  Vision? Y N  Difficulty concentrating or making decisions? Y N  Walking or climbing stairs? Y Y  Dressing or bathing? Y Y  Doing errands, shopping? [name]  Preparing Food and eating ? Y Y  Using the Toilet? N N  In the past six months, have you accidently leaked urine? Y N  Do you have problems with loss of bowel control? Y N  Managing your Medications? Y N  Managing your Finances? Y N  Housekeeping or managing your Housekeeping? [name]  Some recent data might be hidden    Fall/Depression Screening: PHQ 2/9 Scores 08/23/2016 08/12/2016 07/09/2016 06/06/2016 05/14/2016 03/29/2016 03/19/2016  PHQ - 2 Score 0 0 0 0 0 0 0    Assessment:  Medication adherence: Patient taking metoprolol tartrate twice daily, but often reports missing evening dose---if therapeutically appropriate, consider use of metoprolol succinate to see if once daily dosing may improve adherence.   Blister packaging:  Discussed  with patient not all pharmacies offer this service, and there may be a fee involved.  Patient may not be willing to pay a fee if one is required per her report.      Drugs sorted by system:  Neurologic/Psychologic: -carbidopa/levodopa IR -lorazepam as needed -pramipexole   Cardiovascular: -aspirin -furosemide -metoprolol tartrate  Gastrointestinal: -docusate   Topical: -nystatin cream  -triamcinolone cream  Pain: -oxycodone 5mg  IR as needed (in patient's possession, not on medication list)    Vitamins/Minerals: -ferrous sulfate -multivitamin   Infectious Diseases: -doxycycline (has #8 tablets left)   Miscellaneous: -latanoprost eye drops  Plan:  1) Will route note to PCP.   2) Will research blister packaging options.  3) Follow-up with patient next week.   Karrie Meres, PharmD, [place] [phone]

## 2016-09-04 DIAGNOSIS — I872 Venous insufficiency (chronic) (peripheral): Secondary | ICD-10-CM | POA: Diagnosis not present

## 2016-09-04 DIAGNOSIS — G20 Parkinson's disease: Secondary | ICD-10-CM | POA: Diagnosis not present

## 2016-09-04 DIAGNOSIS — M1990 Unspecified osteoarthritis, unspecified site: Secondary | ICD-10-CM | POA: Diagnosis not present

## 2016-09-04 DIAGNOSIS — I110 Hypertensive heart disease with heart failure: Secondary | ICD-10-CM | POA: Diagnosis not present

## 2016-09-04 DIAGNOSIS — I5023 Acute on chronic systolic (congestive) heart failure: Secondary | ICD-10-CM | POA: Diagnosis not present

## 2016-09-04 DIAGNOSIS — F419 Anxiety disorder, unspecified: Secondary | ICD-10-CM | POA: Diagnosis not present

## 2016-09-05 DIAGNOSIS — F419 Anxiety disorder, unspecified: Secondary | ICD-10-CM | POA: Diagnosis not present

## 2016-09-05 DIAGNOSIS — I110 Hypertensive heart disease with heart failure: Secondary | ICD-10-CM | POA: Diagnosis not present

## 2016-09-05 DIAGNOSIS — I5023 Acute on chronic systolic (congestive) heart failure: Secondary | ICD-10-CM | POA: Diagnosis not present

## 2016-09-05 DIAGNOSIS — I872 Venous insufficiency (chronic) (peripheral): Secondary | ICD-10-CM | POA: Diagnosis not present

## 2016-09-05 DIAGNOSIS — M1990 Unspecified osteoarthritis, unspecified site: Secondary | ICD-10-CM | POA: Diagnosis not present

## 2016-09-05 DIAGNOSIS — G20 Parkinson's disease: Secondary | ICD-10-CM | POA: Diagnosis not present

## 2016-09-06 DIAGNOSIS — M1990 Unspecified osteoarthritis, unspecified site: Secondary | ICD-10-CM | POA: Diagnosis not present

## 2016-09-06 DIAGNOSIS — I5023 Acute on chronic systolic (congestive) heart failure: Secondary | ICD-10-CM | POA: Diagnosis not present

## 2016-09-06 DIAGNOSIS — F419 Anxiety disorder, unspecified: Secondary | ICD-10-CM | POA: Diagnosis not present

## 2016-09-06 DIAGNOSIS — I110 Hypertensive heart disease with heart failure: Secondary | ICD-10-CM | POA: Diagnosis not present

## 2016-09-06 DIAGNOSIS — G20 Parkinson's disease: Secondary | ICD-10-CM | POA: Diagnosis not present

## 2016-09-06 DIAGNOSIS — I872 Venous insufficiency (chronic) (peripheral): Secondary | ICD-10-CM | POA: Diagnosis not present

## 2016-09-09 ENCOUNTER — Other Ambulatory Visit: Payer: Self-pay | Admitting: Pharmacist

## 2016-09-09 ENCOUNTER — Other Ambulatory Visit: Payer: Self-pay | Admitting: *Deleted

## 2016-09-09 ENCOUNTER — Other Ambulatory Visit: Payer: Self-pay | Admitting: Licensed Clinical Social Worker

## 2016-09-09 ENCOUNTER — Encounter: Payer: Self-pay | Admitting: *Deleted

## 2016-09-09 NOTE — Patient Outreach (Signed)
[facility]) Care Management  09/09/2016  LEANDRA BARNWELL [birth year] [id number]   Assessment- CSW completed call to patient. Patient answered. Patient is agreeable to home visit this week.  Plan-CSW will complete home visit tomorrow in order to provide social work assistance.  [name], BSW, MSW, [email or web address] Phone: [phone] Fax: [phone]

## 2016-09-09 NOTE — Patient Outreach (Signed)
[facility]) Care Management  [place]   09/09/2016  DEVENA ARCENEAUX [birth year] [id number]  Subjective:  Follow-up home visit with patient regarding medication adherence aides.  Co-visit with [place], and [facility] Pharmacist [place].    Patient reports initially that she missed three days of medications.  Her weekly pill planner is empty---she then states she took her medications.    Patient reports she is still interested in blister packaging of her medications, though not sure if she would want to pay for it.   Objective:   Encounter Medications: Outpatient Encounter Prescriptions as of 09/09/2016  Medication Sig  . aspirin EC 81 MG tablet Take 81 mg by mouth daily.  . carbidopa-levodopa (SINEMET IR) 25-100 MG tablet Take 1 at 6a, 1 1/2 pills at 9a, 1 at 1p, 1 1/2 pills at 3p, 1 at 6pm, 1 1/2 pills at 9pm  . docusate sodium (COLACE) 100 MG capsule Take 100 mg by mouth daily. Reported on 02/28/2016  . doxycycline (VIBRA-TABS) 100 MG tablet Take 1 tablet (100 mg total) by mouth 2 (two) times daily.  . ferrous sulfate 325 (65 FE) MG EC tablet Take 1 tablet (325 mg total) by mouth daily.  . furosemide (LASIX) 40 MG tablet Take 1 tablet (40 mg total) by mouth daily.  Marland Kitchen latanoprost (XALATAN) 0.005 % ophthalmic solution Place 1 drop into the left eye daily at 6 PM.   . LORazepam (ATIVAN) 0.5 MG tablet Take 1 tablet (0.5 mg total) by mouth 2 (two) times daily as needed for anxiety.  . metoprolol tartrate (LOPRESSOR) 25 MG tablet Take 1 tablet (25 mg total) by mouth 2 (two) times daily.  . Multiple Vitamins-Minerals (MULTIVITAMIN WITH MINERALS) tablet Take 1 tablet by mouth daily.  Marland Kitchen nystatin cream (MYCOSTATIN) Apply 1 application topically 2 (two) times daily.  Vladimir Faster Glycol-Propyl Glycol (SYSTANE OP) Place 1 drop into both eyes daily.   . pramipexole (MIRAPEX) 1 MG tablet Take 1 tablet (1 mg total) by mouth 3 (three) times daily.  Marland Kitchen triamcinolone cream  (KENALOG) 0.1 % Apply 1 application topically 2 (two) times daily.   No facility-administered encounter medications on file as of 09/09/2016.     Functional Status: In your present state of health, do you have any difficulty performing the following activities: 08/23/2016 07/09/2016  Hearing? N N  Vision? Y N  Difficulty concentrating or making decisions? Y N  Walking or climbing stairs? Y Y  Dressing or bathing? Y Y  Doing errands, shopping? [name]  Preparing Food and eating ? Y Y  Using the Toilet? N N  In the past six months, have you accidently leaked urine? Y N  Do you have problems with loss of bowel control? Y N  Managing your Medications? Y N  Managing your Finances? Y N  Housekeeping or managing your Housekeeping? [name]  Some recent data might be hidden    Fall/Depression Screening: PHQ 2/9 Scores 08/23/2016 08/12/2016 07/09/2016 06/06/2016 05/14/2016 03/29/2016 03/19/2016  PHQ - 2 Score 0 0 0 0 0 0 0    Assessment:  Medication adherence:  In-basket message to PCP, Dr Mariea Clonts, regarding potential use of metoprolol succinate in place of metoprolol tartrate to attempt to improve adherence by using once daily medication.   Reviewed preferred pharmacies for patient's Medicare Part D plan for 2018---placed call to pharmacies and found two pharmacies who reported they blister package medications for no additional charge at this time.  Counseled patient  it will ultimately be her choice if she wishes to change to a pharmacy whom does blister packaging of medications.    Discussed [facility]) and that it has a sliding scale fee based on income.    Plan:  Will continue to follow-up with PCP regarding metoprolol tartrate vs metoprolol succinate.   Will follow-up with patient in the next 2 weeks regarding blister packing options of her medications.     Karrie Meres, PharmD, [place] [phone]

## 2016-09-09 NOTE — Patient Outreach (Signed)
[facility]) Care Management  [place] Routine Home visit 09/09/2016  [name] 12/31/1940 [id number]   [name] is an 76 y.o. female well known to [name] CMfor multiple hospitalizations and safety concerns. [name] most recently admitted to the hospital September 18-20, 2017,after experiencing syncopal episode/ AMS at home. Patient was discharged to [name] for rehabiliation, and was subsequently discharged [facility] from [name] on July 05, 2016 with home health [name]) services.THN CSW and THN pharmacyisalso actively involvedin [name]'s care for community resource/ medication adherence evaluation. Patient and her personal care assistant, [name], are present today for home visit.  HIPAA verified with patient in person today.  Today is a joint visit with [facility] pharmacist, [name].  Primary purpose of today's visit is to complete filling of patient's prescription pill box medicines.  However, patient does not have all medications present in her home to do so, as she has run out of metoprolol.  Please see specific details in note of [name] Pharmacist, [name], who discussed medication management with patient thoroughly today, and will follow up with patient's PCP and then with patient regarding metoprolol dosing/ scheduling.  Pill box with prescription medications was re-filled for [name] today by [name] pharmacist [name].  [name] reports that she has completed her antibiotics that were prescribed during PCP visit on August 26, 2016.  Today, [name] states that home health nurse has completed her last visit "on Friday last week."    [name] appears at her baseline from previous [place] in-home visits, and denies concerns, questions, issues or problems today.  [name] in-home visit scheduled today for next month.  Subjective:  "I'm doing about the same as I always am."  Objective:    BP 102/60   Pulse 68   Resp 16   SpO2 94%    Review of Systems   Constitutional: Negative.   Respiratory: Positive for shortness of breath.        Baseline shortness of breath with activity; no change from previous [place] in-home visit last week  Cardiovascular: Positive for leg swelling.       Baseline bilateral +2 ankle/ LE swelling noted; no change from last [place] CM in home visit last week  Gastrointestinal: Negative for abdominal pain and nausea.  Musculoskeletal: Negative for falls.  Neurological: Positive for tremors.       History of Parkinson's Disease  Psychiatric/Behavioral: Negative for depression and memory loss. The patient is not nervous/anxious.     Physical Exam  Constitutional: She is oriented to person, place, and time. She appears well-developed and well-nourished. No distress.  Cardiovascular: Normal rate, regular rhythm and normal heart sounds.   Respiratory: Effort normal and breath sounds normal. No respiratory distress. She has no wheezes. She has no rales.  GI: Soft. Bowel sounds are normal.  Musculoskeletal: She exhibits edema.  See ROS  Neurological: She is alert and oriented to person, place, and time.  Skin: Skin is warm and dry. There is erythema.  Bilateral LE erythema  Psychiatric: She has a normal mood and affect. Her behavior is normal. Judgment and thought content normal.    Encounter Medications:   Outpatient Encounter Prescriptions as of 09/09/2016  Medication Sig  . aspirin EC 81 MG tablet Take 81 mg by mouth daily.  . carbidopa-levodopa (SINEMET IR) 25-100 MG tablet Take 1 at 6a, 1 1/2 pills at 9a, 1 at 1p, 1 1/2 pills at 3p, 1 at 6pm, 1 1/2 pills at 9pm  .  docusate sodium (COLACE) 100 MG capsule Take 100 mg by mouth daily. Reported on 02/28/2016  . doxycycline (VIBRA-TABS) 100 MG tablet Take 1 tablet (100 mg total) by mouth 2 (two) times daily.  . ferrous sulfate 325 (65 FE) MG EC tablet Take 1 tablet (325 mg total) by mouth daily.  . furosemide (LASIX) 40 MG tablet Take 1 tablet (40 mg  total) by mouth daily.  Marland Kitchen latanoprost (XALATAN) 0.005 % ophthalmic solution Place 1 drop into the left eye daily at 6 PM.   . LORazepam (ATIVAN) 0.5 MG tablet Take 1 tablet (0.5 mg total) by mouth 2 (two) times daily as needed for anxiety.  . metoprolol tartrate (LOPRESSOR) 25 MG tablet Take 1 tablet (25 mg total) by mouth 2 (two) times daily.  . Multiple Vitamins-Minerals (MULTIVITAMIN WITH MINERALS) tablet Take 1 tablet by mouth daily.  Marland Kitchen nystatin cream (MYCOSTATIN) Apply 1 application topically 2 (two) times daily.  Vladimir Faster Glycol-Propyl Glycol (SYSTANE OP) Place 1 drop into both eyes daily.   . pramipexole (MIRAPEX) 1 MG tablet Take 1 tablet (1 mg total) by mouth 3 (three) times daily.  Marland Kitchen triamcinolone cream (KENALOG) 0.1 % Apply 1 application topically 2 (two) times daily.   No facility-administered encounter medications on file as of 09/09/2016.     Functional Status:   In your present state of health, do you have any difficulty performing the following activities: 08/23/2016 07/09/2016  Hearing? N N  Vision? Y N  Difficulty concentrating or making decisions? Y N  Walking or climbing stairs? Y Y  Dressing or bathing? Y Y  Doing errands, shopping? [name]  Preparing Food and eating ? Y Y  Using the Toilet? N N  In the past six months, have you accidently leaked urine? Y N  Do you have problems with loss of bowel control? Y N  Managing your Medications? Y N  Managing your Finances? Y N  Housekeeping or managing your Housekeeping? [name]  Some recent data might be hidden    Fall/Depression Screening:    PHQ 2/9 Scores 08/23/2016 08/12/2016 07/09/2016 06/06/2016 05/14/2016 03/29/2016 03/19/2016  PHQ - 2 Score 0 0 0 0 0 0 0    Assessment:  Safety issues with [name]'s medicationsthat were identified during [place] in-home visit continue to persist, despite previous efforts made to correct. [place] continues to verbalize inconsistent reporting of her medication adherence.  Although she has intermittently kept her medications in one central location in her home, this has been inconsistent.  Milda is agreeable to explore options for medication management with  [place]. [place] is committed to keeping her hospital follow up provider appointments, and agrees to contact her providers promptly for any [name] concerns, issues, or problems that arise.   Plan:   [name] take her medications as prescribed and will attend all scheduled provider appointments.  Manasvi will continueusing pill boxesfor all of Ilyanna's prescription medications.  [name] continue actively working with [name] RN CM, andTHN CSW and [name] pharmacy.  [name] notify her providers for any concerns, issues, problems, or questions that arise.  Continued [facility] CM outreach for self-health management of chronic disease state of CHF and medication management issues to continue with routine home visit scheduled next month.   [name], RN, BSN, [employer] [facility] Care Management  [phone]

## 2016-09-10 ENCOUNTER — Other Ambulatory Visit: Payer: Self-pay | Admitting: Licensed Clinical Social Worker

## 2016-09-10 NOTE — Patient Outreach (Addendum)
[facility]) Care Management  [facility] Social Work  09/10/2016  [name] Jun 22, 1941 [id number]   Encounter Medications:  Outpatient Encounter Prescriptions as of 09/10/2016  Medication Sig  . aspirin EC 81 MG tablet Take 81 mg by mouth daily.  . carbidopa-levodopa (SINEMET IR) 25-100 MG tablet Take 1 at 6a, 1 1/2 pills at 9a, 1 at 1p, 1 1/2 pills at 3p, 1 at 6pm, 1 1/2 pills at 9pm  . docusate sodium (COLACE) 100 MG capsule Take 100 mg by mouth daily. Reported on 02/28/2016  . ferrous sulfate 325 (65 FE) MG EC tablet Take 1 tablet (325 mg total) by mouth daily.  . furosemide (LASIX) 40 MG tablet Take 1 tablet (40 mg total) by mouth daily.  Marland Kitchen latanoprost (XALATAN) 0.005 % ophthalmic solution Place 1 drop into the left eye daily at 6 PM.   . LORazepam (ATIVAN) 0.5 MG tablet Take 1 tablet (0.5 mg total) by mouth 2 (two) times daily as needed for anxiety.  . metoprolol tartrate (LOPRESSOR) 25 MG tablet Take 1 tablet (25 mg total) by mouth 2 (two) times daily.  . Multiple Vitamins-Minerals (MULTIVITAMIN WITH MINERALS) tablet Take 1 tablet by mouth daily.  Marland Kitchen nystatin cream (MYCOSTATIN) Apply 1 application topically 2 (two) times daily.  Vladimir Faster Glycol-Propyl Glycol (SYSTANE OP) Place 1 drop into both eyes daily.   . pramipexole (MIRAPEX) 1 MG tablet Take 1 tablet (1 mg total) by mouth 3 (three) times daily.  Marland Kitchen triamcinolone cream (KENALOG) 0.1 % Apply 1 application topically 2 (two) times daily.   No facility-administered encounter medications on file as of 09/10/2016.     Functional Status:  In your present state of health, do you have any difficulty performing the following activities: 08/23/2016 07/09/2016  Hearing? N N  Vision? Y N  Difficulty concentrating or making decisions? Y N  Walking or climbing stairs? Y Y  Dressing or bathing? Y Y  Doing errands, shopping? [name]  Preparing Food and eating ? Y Y  Using the Toilet? N N  In the past six months, have you  accidently leaked urine? Y N  Do you have problems with loss of bowel control? Y N  Managing your Medications? Y N  Managing your Finances? Y N  Housekeeping or managing your Housekeeping? [name]  Some recent data might be hidden    Fall/Depression Screening:  PHQ 2/9 Scores 09/10/2016 08/23/2016 08/12/2016 07/09/2016 06/06/2016 05/14/2016 03/29/2016  PHQ - 2 Score 0 0 0 0 0 0 0    Assessment: CSW completed home visit on 09/10/16 with patient, aide present and then patient's brother came at the end of the session. Patient had two males that were in her home that sold her 3 boxes of meats for $560.00. Patient's aide was concerned that they were charging her extra. Aide contacted agency number and was informed that their agency sells their meat to representatives and then they sell it at their own price. CSW educated patient on elderly fraud scams and how to prevent from being a target. CSW encouraged patient to discuss future purchases with family beforehand and to ensure that family is there during transaction. CSW is concerned that this may happen again as patient often calls sale numbers that she sees on television to make purchases.   THN RNCM and Pharmacist completed home visit yesterday and were working on getting patient a new blood pressure medication to increase adherence. Patient reports that she is currently out of blood  pressure medication but informed RNCM and Pharmacist who contacted PCP office. CSW provided education on [facility] and their RN services. Aide reports that patient did not take any of her medications 08/30/16-09/01/16. She reports that last weekend she called her everyday to ensure that she took medications as prescribed.   Patient reports that her aide took her to a Christmas Dinner at her church last week where she ate and exchanged presents. CSW educated patient on other opportunities for socialization within the community such as Psychiatric nurse and [employer].   Patient reports  no difficulty with breathing. She states that her appetite is normal. She states that [employer] continue to deliver her meals through the week. She reports that her legs do not cause her pain but they continue to swell. Patient reports that she has been attending all scheduled medical appointments.   CSW spent time educating patient on community resources and medication management resources. Patient denies needing [facility] RN services at this time but will consider socialization opportunities.   Plan: CSW will follow up within 30 days and continue to provide social work support and assistance.   [facility] CM Care Plan Problem One   Flowsheet Row Most Recent Value  Care Plan Problem One  Need for ongoing support in the home since [name] admission  Role Documenting the Problem One  Clinical Social Worker  Care Plan for Problem One  Active  THN Long Term Goal (31-90 days)  Patient will utilize her aide, support network, community resources and coping methods within 90 days to improve overall health and well being  [facility] Long Term Goal Start Date  08/08/16  Interventions for Problem One Long Term Goal  CSW will review community resources during monthly outreaches. CSW will coordinate care with caregiver and family. CSW will educate her on appropriate coping tools as needed and monitor progress towards goal.  THN CM Short Term Goal #1 (0-30 days)  Over the next 30 days, patient will attend all scheduled medical appointments and have stable transportation to each visit  THN CM Short Term Goal #1 Start Date  08/08/16  [facility] CM Short Term Goal #1 Met Date  09/10/16  Interventions for Short Term Goal #1  Goal met. Brother [name] took her to appointment in [place].  THN CM Short Term Goal #2 (0-30 days)  Patient will increase socialization within 30 days due to isolation.  THN CM Short Term Goal #2 Start Date  09/10/16  Interventions for Short Term Goal #2  CSW will provide socialization opportunities within  [facility]. CSW will provide motivational interventions as well.     [name], BSW, MSW, [email or web address] Phone: [phone] Fax: [phone]

## 2016-09-11 ENCOUNTER — Other Ambulatory Visit: Payer: Self-pay | Admitting: Licensed Clinical Social Worker

## 2016-09-11 ENCOUNTER — Telehealth: Payer: Self-pay | Admitting: *Deleted

## 2016-09-11 ENCOUNTER — Other Ambulatory Visit: Payer: Self-pay | Admitting: Internal Medicine

## 2016-09-11 ENCOUNTER — Other Ambulatory Visit: Payer: Self-pay | Admitting: *Deleted

## 2016-09-11 DIAGNOSIS — I10 Essential (primary) hypertension: Secondary | ICD-10-CM

## 2016-09-11 MED ORDER — METOPROLOL SUCCINATE ER 50 MG PO TB24: 50.0000 mg | ORAL_TABLET | Freq: Every day | ORAL | 5 refills | Status: DC

## 2016-09-11 NOTE — Patient Outreach (Signed)
[facility]) Care Management  09/11/2016  [name] 27-Feb-1941 [id number]   Assessment- CSW received return message from patient's PCP. CSW routed yesterday's encounter to PCP which included information in regards to elderly fraud and scamming. PCP will discuss this with patient during next visit to prevent her being taken advantage of in the future.  Plan-CSW will follow up with patient within 30 days and continue to educate her on elderly fraud, scams and crimes.  [name], BSW, MSW, [name] Phone: [phone] Fax: [phone]

## 2016-09-11 NOTE — Patient Outreach (Signed)
[facility]) Care Management [place] Telephone Outreach, Care Coordination 09/11/2016  MICAYLAH HIMMELREICH 05/28/1941 [id number]   Successful telephone outreach from [place], [facility] Pharmacist, regarding Karl Bales y.o. female well known to [place] CMfor multiple hospitalizations and safety concerns. Lennette Bihari has been working with patient/ collaborating with [place] RN CM in an effort to improve patient's medication adherence.  Today, Lennette Bihari reports that he received communication from patient's PCP regarding her Metoprolol dosing.  Patient has not been compliant with her use of this medication, which was previously dosed at metoprolol 25 mg BID; patient had only been taking one time per day, and sometimes did not take at all.    Lennette Bihari stated that patient's PCP has now prescribed metoprolol 50 mg, extended release, which patient will take only one time per day.  Lennette Bihari reports that this medication was called in to patient's pharmacy today and should be ready for patient pick up this afternoon.  Plan:    I will attempt telephone outreach to patient tomorrow to ensure that she has obtained this medication and understanding new dosing/ scheduling instructions.  [name], RN, BSN, [employer] [facility] Care Management  [phone]

## 2016-09-11 NOTE — Telephone Encounter (Signed)
Spoke with patient and advised results, she understood and didn't have any questions

## 2016-09-11 NOTE — Telephone Encounter (Signed)
[name] L Reed, DO  You 5 minutes ago (1:34 PM)    Please notify Jane that we changed her lopressor (metoprolol tartrate) 25mg  twice a day to metoprolol succinate 50mg  once a day to decrease her pill burden. I was getting reports that she was missing her afternoon pill.  (Routing comment)

## 2016-09-12 ENCOUNTER — Other Ambulatory Visit: Payer: Self-pay | Admitting: *Deleted

## 2016-09-12 ENCOUNTER — Encounter: Payer: Self-pay | Admitting: *Deleted

## 2016-09-12 ENCOUNTER — Ambulatory Visit: Payer: Medicare Other | Admitting: Internal Medicine

## 2016-09-12 NOTE — Patient Outreach (Signed)
[facility]) Care Management [place] Telephone Outreach 09/12/2016  [name] 11/17/1940 [id number]   Successful telephone outreach to [name], 75 y.o. female well known to [place] CMfor multiple hospitalizations and safety concerns. [name] most recently admitted to the hospital September 18-20, 2017,after experiencing syncopal episode/ AMS at home. Patient was discharged to [name] for rehabiliation, and was subsequently discharged home from [facility] on July 05, 2016 with home health [name]) services, which are now completed.THN CSW and THN pharmacyisalso actively involvedin [name]'s care for community resource/ medication adherenceevaluation. Patient and her personal care assistant, [name], are present for phone call today, as phone was on speaker mode. HIPAA verified with patient over phone today.  [name], [facility] Pharmacist [name] provided update  from patient's PCP regarding her Metoprolol dosing, which [place] has been working with patient on adherence.  Patient has not previously been compliant with her use of this medication, which was dosed at metoprolol 25 mg BID; patient had only been taking one time per day, and sometimes did not take at all.  As of 09/11/16, patient's PCP has now prescribed metoprolol 50 mg, extended release, which patient will take only one time per day.    Today, patient reported that she has picked up the [name] dosing of metoprolol 50 mg and verbalizes that she has taken this medication today and understands to take this medication only one time per day.  [name], patient's care assistant who assists patient with her medications, also verbalizes an accurate understanding of the [name] dosing/ scheduling of metoprolol.  Patient denies further concerns, issues, or problems today.  [place] in-home visit for next month was confirmed with patient today.  Plan:  [name] take her medications as prescribed  and will attend all scheduled provider appointments.  [name] will continueusing pill boxesfor all of [name]'s prescription medications.  [name] continue actively working with [facility] RN CM, andTHN CSW and [facility] pharmacy.  [name] notify her providers for any concerns, issues, problems, or questions that arise.  Continued [facility] CM outreach for self-health management of chronic disease state of CHF and medication management issues to continue with routine home visit scheduled next month.  [name], RN, BSN, [employer] [facility] Care Management  [phone]

## 2016-09-19 ENCOUNTER — Emergency Department (HOSPITAL_COMMUNITY): Payer: Medicare Other

## 2016-09-19 ENCOUNTER — Encounter (HOSPITAL_COMMUNITY): Payer: Self-pay | Admitting: Emergency Medicine

## 2016-09-19 ENCOUNTER — Encounter: Payer: Self-pay | Admitting: Internal Medicine

## 2016-09-19 ENCOUNTER — Ambulatory Visit (INDEPENDENT_AMBULATORY_CARE_PROVIDER_SITE_OTHER): Payer: Medicare Other | Admitting: Internal Medicine

## 2016-09-19 ENCOUNTER — Emergency Department (HOSPITAL_COMMUNITY)
Admission: EM | Admit: 2016-09-19 | Discharge: 2016-09-20 | Disposition: A | Discharge status: Home / Self Care | Payer: Medicare Other | Attending: Emergency Medicine | Admitting: Emergency Medicine

## 2016-09-19 VITALS — BP 148/80 | HR 68 | Temp 97.6°F | Wt 176.0 lb

## 2016-09-19 DIAGNOSIS — Z043 Encounter for examination and observation following other accident: Secondary | ICD-10-CM | POA: Diagnosis not present

## 2016-09-19 DIAGNOSIS — Z7982 Long term (current) use of aspirin: Secondary | ICD-10-CM | POA: Insufficient documentation

## 2016-09-19 DIAGNOSIS — E039 Hypothyroidism, unspecified: Secondary | ICD-10-CM | POA: Insufficient documentation

## 2016-09-19 DIAGNOSIS — I10 Essential (primary) hypertension: Secondary | ICD-10-CM | POA: Diagnosis not present

## 2016-09-19 DIAGNOSIS — I5022 Chronic systolic (congestive) heart failure: Secondary | ICD-10-CM

## 2016-09-19 DIAGNOSIS — S4992XA Unspecified injury of left shoulder and upper arm, initial encounter: Secondary | ICD-10-CM | POA: Diagnosis not present

## 2016-09-19 DIAGNOSIS — Y999 Unspecified external cause status: Secondary | ICD-10-CM | POA: Diagnosis not present

## 2016-09-19 DIAGNOSIS — M25551 Pain in right hip: Secondary | ICD-10-CM | POA: Diagnosis not present

## 2016-09-19 DIAGNOSIS — Z8673 Personal history of transient ischemic attack (TIA), and cerebral infarction without residual deficits: Secondary | ICD-10-CM | POA: Insufficient documentation

## 2016-09-19 DIAGNOSIS — I872 Venous insufficiency (chronic) (peripheral): Secondary | ICD-10-CM

## 2016-09-19 DIAGNOSIS — M546 Pain in thoracic spine: Secondary | ICD-10-CM | POA: Diagnosis not present

## 2016-09-19 DIAGNOSIS — G20 Parkinson's disease: Secondary | ICD-10-CM | POA: Diagnosis not present

## 2016-09-19 DIAGNOSIS — W19XXXA Unspecified fall, initial encounter: Secondary | ICD-10-CM

## 2016-09-19 DIAGNOSIS — M25552 Pain in left hip: Secondary | ICD-10-CM | POA: Insufficient documentation

## 2016-09-19 DIAGNOSIS — K047 Periapical abscess without sinus: Secondary | ICD-10-CM

## 2016-09-19 DIAGNOSIS — S0990XA Unspecified injury of head, initial encounter: Secondary | ICD-10-CM | POA: Diagnosis not present

## 2016-09-19 DIAGNOSIS — Z79899 Other long term (current) drug therapy: Secondary | ICD-10-CM | POA: Insufficient documentation

## 2016-09-19 DIAGNOSIS — W1830XA Fall on same level, unspecified, initial encounter: Secondary | ICD-10-CM | POA: Diagnosis not present

## 2016-09-19 DIAGNOSIS — R062 Wheezing: Secondary | ICD-10-CM

## 2016-09-19 DIAGNOSIS — Y939 Activity, unspecified: Secondary | ICD-10-CM | POA: Diagnosis not present

## 2016-09-19 DIAGNOSIS — Y92009 Unspecified place in unspecified non-institutional (private) residence as the place of occurrence of the external cause: Secondary | ICD-10-CM | POA: Diagnosis not present

## 2016-09-19 DIAGNOSIS — G20A1 Parkinson's disease without dyskinesia, without mention of fluctuations: Secondary | ICD-10-CM

## 2016-09-19 DIAGNOSIS — I5023 Acute on chronic systolic (congestive) heart failure: Secondary | ICD-10-CM | POA: Diagnosis not present

## 2016-09-19 DIAGNOSIS — I110 Hypertensive heart disease with heart failure: Secondary | ICD-10-CM | POA: Diagnosis not present

## 2016-09-19 DIAGNOSIS — S199XXA Unspecified injury of neck, initial encounter: Secondary | ICD-10-CM | POA: Diagnosis not present

## 2016-09-19 DIAGNOSIS — S79912A Unspecified injury of left hip, initial encounter: Secondary | ICD-10-CM | POA: Diagnosis not present

## 2016-09-19 DIAGNOSIS — M545 Low back pain: Secondary | ICD-10-CM | POA: Diagnosis not present

## 2016-09-19 DIAGNOSIS — T148XXA Other injury of unspecified body region, initial encounter: Secondary | ICD-10-CM | POA: Diagnosis not present

## 2016-09-19 DIAGNOSIS — S79911A Unspecified injury of right hip, initial encounter: Secondary | ICD-10-CM | POA: Diagnosis not present

## 2016-09-19 DIAGNOSIS — M25512 Pain in left shoulder: Secondary | ICD-10-CM | POA: Diagnosis not present

## 2016-09-19 LAB — CBC WITH DIFFERENTIAL/PLATELET
Basophils Absolute: 0.1 K/uL (ref 0.0–0.1)
Basophils Relative: 1 %
Eosinophils Absolute: 0.4 K/uL (ref 0.0–0.7)
Eosinophils Relative: 4 %
HCT: 39 % (ref 36.0–46.0)
Hemoglobin: 12.8 g/dL (ref 12.0–15.0)
Lymphocytes Relative: 19 %
Lymphs Abs: 1.6 K/uL (ref 0.7–4.0)
MCH: 28 pg (ref 26.0–34.0)
MCHC: 32.8 g/dL (ref 30.0–36.0)
MCV: 85.3 fL (ref 78.0–100.0)
Monocytes Absolute: 0.8 K/uL (ref 0.1–1.0)
Monocytes Relative: 9 %
Neutro Abs: 5.9 K/uL (ref 1.7–7.7)
Neutrophils Relative %: 68 %
Platelets: 154 K/uL (ref 150–400)
RBC: 4.57 MIL/uL (ref 3.87–5.11)
RDW: 16.1 % — ABNORMAL HIGH (ref 11.5–15.5)
WBC: 8.7 K/uL (ref 4.0–10.5)

## 2016-09-19 LAB — URINALYSIS, ROUTINE W REFLEX MICROSCOPIC
Bilirubin Urine: NEGATIVE
Glucose, UA: NEGATIVE mg/dL
Hgb urine dipstick: NEGATIVE
Ketones, ur: NEGATIVE mg/dL
Leukocytes, UA: NEGATIVE
Nitrite: NEGATIVE
Protein, ur: NEGATIVE mg/dL
Specific Gravity, Urine: 1.018 (ref 1.005–1.030)
pH: 6 (ref 5.0–8.0)

## 2016-09-19 LAB — BASIC METABOLIC PANEL WITH GFR
Anion gap: 5 (ref 5–15)
BUN: 17 mg/dL (ref 6–20)
CO2: 27 mmol/L (ref 22–32)
Calcium: 8.5 mg/dL — ABNORMAL LOW (ref 8.9–10.3)
Chloride: 111 mmol/L (ref 101–111)
Creatinine, Ser: 0.8 mg/dL (ref 0.44–1.00)
GFR calc Af Amer: >60 mL/min
GFR calc non Af Amer: >60 mL/min
Glucose, Bld: 104 mg/dL — ABNORMAL HIGH (ref 65–99)
Potassium: 4.2 mmol/L (ref 3.5–5.1)
Sodium: 143 mmol/L (ref 135–145)

## 2016-09-19 NOTE — ED Notes (Signed)
Pt states she is unable to walk. Attempted to take pt to the bathroom and pt was only able to stand a little with 2 assist. Encouraged pt to stand up straight but the pt stated she was unable to do so. Pt states she uses a lift chair at home. Stedy used to get the pt to the bathroom.

## 2016-09-19 NOTE — ED Provider Notes (Signed)
[facility] DEPT Provider Note   CSN: [id number] Arrival date & time: 09/19/16  1640    History   Chief Complaint Chief Complaint  Patient presents with  . Back Pain    HPI [name] is a 75 y.o. female.  HPI   75 year old female presents today status post fall. Patient reports she was walking to her front door when she fell. She does not recall losing consciousness, but cannot remember why she fell. Patient reports she was unable to get back to her feet and was transported via EMS. Patient reports she is having pain to her bilateral hips, she also notes pain to her left proximal shoulder. Patient notes initially she did not have any head or neck pain, but throughout my exam she's reports pain at the base of her skull and neck. Patient denies any chest pain or shortness of breath, dizziness or any lower extremity injuries.  Patient was seen by her primary care provider today and was in relatively good health. Patient has a history of chronic systolic congestive heart failure most recently admitted to the hospital status post syncope with worsening heart failure. She was discharged to skilled nursing facility and discharged home from [facility] on 07/05/2016 with home health services which have now completed. Patient notes that she has 70 coming into her home daily to help out with day-to-day activities.   Past Medical History:  Diagnosis Date  . Acute on chronic systolic heart failure ([place])   . Anemia   . Anxiety   . Arthritis   . Cataract left  . Cellulitis and abscess of leg, except foot   . Cellulitis of lower leg 12/24/2011  . CHF (congestive heart failure) ([place])   . Chronic bronchitis ([place])    "usually get it q yr"  . Chronic systolic heart failure ([place])   . GERD (gastroesophageal reflux disease)   . Glaucoma left  . Helicobacter pylori (H. pylori)   . History of bleeding peptic ulcer   . History of blood transfusion    "don't remember why"  . Hypercholesterolemia   .  Hypertension   . Hypotension, unspecified   . Hypothyroidism   . Parkinson's disease   . Pneumonia   . Sleep apnea    "had test years ago; insurance wouldn't pay for mask so I never had one" (04/27/2015)  . Stroke [facility]) X 3   "that  was the reason I got Parkinson's"    Patient Active Problem List   Diagnosis Date Noted  . Anxiety state 06/18/2016  . 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 Syncope 06/10/2016  . Fall 06/10/2016  . Near syncope   . Altered mental state   . Chronic systolic congestive heart failure ([place])   . CHF exacerbation ([place]) 05/25/2016  . Pneumonia 05/25/2016  . Bilateral cellulitis of lower leg 03/11/2016  . Venous stasis dermatitis of both lower extremities 03/11/2016  . Melena 02/15/2016  . Right lower quadrant abdominal swelling, mass and lump 02/15/2016  . Acute delirium 04/27/2015  . Acute encephalopathy 04/27/2015  . UTI (urinary tract infection): Probable 04/27/2015  . Debility   . Dyskinesia 04/24/2015  . Cellulitis of left leg 04/23/2015  . Failure to thrive in adult 04/23/2015  . Chronic low back pain 01/18/2015  . Lower extremity pain 12/07/2014  . Cellulitis of left lower extremity   . Nausea vomiting and diarrhea 08/07/2014  . Enteritis 08/07/2014  . Osteoarthritis of spine with radiculopathy, cervical region  02/11/2014  . Chronic constipation 02/11/2014  . Primary freezing of gait 02/11/2014  . Abnormality of gait 01/28/2014  . Physical deconditioning 10/05/2013  . Thrombocytopenia, unspecified 09/30/2013  . Rectal bleeding 09/29/2013  . UGI bleed 09/29/2013  . Anemia due to blood loss, acute 09/29/2013  . Anemia, iron deficiency 04/09/2013  . Cameron lesion, acute 01/08/2012  . Acute urinary retention, resolved after foley and no re-occurrence, 12/11/11  12/24/2011  . Cellulitis of lower leg 12/24/2011  . Diastolic dysfunction, grade 2 12/15/2011  . CAP (community acquired pneumonia)  12/15/2011   . Abnormal EKG,(TWI lead 3 on one of 7 EKGs this admission) 12/12/2011  . Chest pain, MI R/O, negative MI- secondary to CAP 12/10/11 12/10/2011  . Cor pulmonale ([place]) 12/10/2011  . Syncope and collapse, pt fell and did not know why she fell. 12/10/2011  . Hematemesis 09/26/2011  . Acute blood loss anemia 09/26/2011  . Hypotension 09/26/2011  . Leucocytosis 09/26/2011  . Aspiration of blood 09/26/2011  . Parkinson disease ([place]) 09/26/2011  . Essential hypertension 09/26/2011  . GI bleed: January 2013, Hgb stable, stool negative 09/26/2011  . Gastric ulcer with hemorrhage 09/26/2011  . Esophagitis, erosive 09/26/2011    Past Surgical History:  Procedure Laterality Date  . BACK SURGERY    . ESOPHAGOGASTRODUODENOSCOPY  09/26/2011   Procedure: ESOPHAGOGASTRODUODENOSCOPY (EGD);  Surgeon: Zenovia Jarred, MD;  Location[facility] ENDOSCOPY;  Service: Gastroenterology;  Laterality: N/A;  To be done at bedside.  . ESOPHAGOGASTRODUODENOSCOPY N/A 09/30/2013   Procedure: ESOPHAGOGASTRODUODENOSCOPY (EGD);  Surgeon: [name], MD;  Location: [facility];  Service: Endoscopy;  Laterality: N/A;  . ESOPHAGOGASTRODUODENOSCOPY N/A 11/25/2013   Procedure: ESOPHAGOGASTRODUODENOSCOPY (EGD);  Surgeon: [name], MD;  Location: [facility] ENDOSCOPY;  Service: Endoscopy;  Laterality: N/A;  . HEMIARTHROPLASTY HIP Right 09/24/2007   Archie Endo 01/23/2011  . KYPHOPLASTY  06/2009   T12/notes 07/17/2009  . NISSEN FUNDOPLICATION     "had OR for GERD"  . TENDON REPAIR     Gluteus medius Archie Endo 01/23/2011  . THYROIDECTOMY    . TONSILLECTOMY      OB History    No data available       Home Medications    Prior to Admission medications   Medication Sig Start Date End Date Taking? Authorizing Provider  ferrous sulfate 325 (65 FE) MG EC tablet Take 1 tablet (325 mg total) by mouth daily. 02/22/16  Yes [name] L Reed, DO  furosemide (LASIX) 40 MG tablet Take 1 tablet (40 mg total) by mouth daily. 06/12/16  Yes Domenic Polite, MD   aspirin EC 81 MG tablet Take 81 mg by mouth daily.    Historical Provider, MD  carbidopa-levodopa (SINEMET IR) 25-100 MG tablet Take 1 at 6a, 1 1/2 pills at [place], 1 at 1p, 1 1/2 pills at 3p, 1 at 6pm, 1 1/2 pills at 9pm 08/13/16   Dennie Bible, NP  docusate sodium (COLACE) 100 MG capsule Take 100 mg by mouth daily. Reported on 02/28/2016    Historical Provider, MD  latanoprost (XALATAN) 0.005 % ophthalmic solution Place 1 drop into the left eye daily at 6 PM.  02/23/15   Historical Provider, MD  LORazepam (ATIVAN) 0.5 MG tablet Take 1 tablet (0.5 mg total) by mouth 2 (two) times daily as needed for anxiety. 06/12/16   Domenic Polite, MD  metoprolol succinate (TOPROL-XL) 50 MG 24 hr tablet Take 1 tablet (50 mg total) by mouth daily. 09/11/16   [name] L Reed, DO  Multiple Vitamins-Minerals (  MULTIVITAMIN WITH MINERALS) tablet Take 1 tablet by mouth daily.    Historical Provider, MD  nystatin cream (MYCOSTATIN) Apply 1 application topically 2 (two) times daily.    Historical Provider, MD  Polyethyl Glycol-Propyl Glycol (SYSTANE OP) Place 1 drop into both eyes daily.     Historical Provider, MD  pramipexole (MIRAPEX) 1 MG tablet Take 1 tablet (1 mg total) by mouth 3 (three) times daily. 10/10/15   Star Age, MD  triamcinolone cream (KENALOG) 0.1 % Apply 1 application topically 2 (two) times daily.    Historical Provider, MD    Family History Family History  Problem Relation Age of Onset  . GER disease Mother   . Heart disease Father     Social History Social History  Substance Use Topics  . Smoking status: Never Smoker  . Smokeless tobacco: Never Used  . Alcohol use No     Allergies   Codeine and Penicillins   Review of Systems Review of Systems  All other systems reviewed and are negative.   Physical Exam Updated Vital Signs BP 152/69 (BP Location: Left Arm)   Pulse 69   Temp 98.3 F (36.8 C) (Oral)   Resp 16   Ht 5\' 4"  (1.626 m)   Wt 77.1 kg   SpO2 98%   BMI 29.18  kg/m   Physical Exam  Constitutional: She is oriented to person, place, and time.  HENT:  Head: Normocephalic and atraumatic.  Eyes: Conjunctivae are normal. Pupils are equal, round, and reactive to light. Right eye exhibits no discharge. Left eye exhibits no discharge. No scleral icterus.  Neck: Normal range of motion. No JVD present. No tracheal deviation present.  Pulmonary/Chest: Effort normal. No stridor.  Musculoskeletal:  Patient is atraumatic, she has tenderness to palpation of the posterior neck, tenderness to palpation of the left proximal humerus and anterior shoulder full active range of motion of the upper extremities with 5 out of 5 strength. Hips are stable, bilateral tenderness to palpation with both anterior and posterior compression,no pain with internal/external rotation of the bilateral lower extremities, sensation intact. No point tenderness to CT or L spine. Chronic appearing skin changes to bilateral lower extremities.   Neurological: She is alert and oriented to person, place, and time. Coordination normal.  Psychiatric: She has a normal mood and affect. Her behavior is normal. Judgment and thought content normal.  Nursing note and vitals reviewed.    ED Treatments / Results  Labs (all labs ordered are listed, but only abnormal results are displayed) Labs Reviewed  CBC WITH DIFFERENTIAL/PLATELET - Abnormal; Notable for the following:       Result Value   RDW 16.1 (*)    All other components within normal limits  BASIC METABOLIC PANEL - Abnormal; Notable for the following:    Glucose, Bld 104 (*)    Calcium 8.5 (*)    All other components within normal limits  URINALYSIS, ROUTINE W REFLEX MICROSCOPIC    EKG  EKG Interpretation None       Radiology Ct Head Wo Contrast  Result Date: 09/19/2016 CLINICAL DATA:  Per EMS, patient from home, c/o lower back pain after fall today while trying to get to the door. Denies head injury and LOC. Hx Parkinson's and  chronic cellulitis to lower extremities. AANDOx4. EXAM: CT HEAD WITHOUT CONTRAST CT CERVICAL SPINE WITHOUT CONTRAST TECHNIQUE: Multidetector CT imaging of the head and cervical spine was performed following the standard protocol without intravenous contrast. Multiplanar CT image reconstructions of  the cervical spine were also generated. COMPARISON:  06/10/2016 FINDINGS: CT HEAD FINDINGS Brain: The ventricles are normal in size and configuration. There are no parenchymal masses or mass effect. An area of encephalomalacia along the lateral left frontal lobe reflects an old infarct, stable. There is no evidence of an infarct. There are no extra-axial masses or abnormal fluid collections. There is no intracranial hemorrhage. Vascular: No hyperdense vessel or unexpected calcification. Skull: Normal. Negative for fracture or focal lesion. Sinuses/Orbits: Globes orbits are unremarkable. Visualized sinuses and mastoid air cells are clear. Other: There multiple stable partly calcified scalp lesions. CT CERVICAL SPINE FINDINGS Alignment: Normal. Skull base and vertebrae: No acute fracture. No primary bone lesion or focal pathologic process. Soft tissues and spinal canal: No prevertebral fluid or swelling. No visible canal hematoma. Heterogeneous enlarged left thyroid lobe, with multiple nodules, mildly deviates the trachea to the right, stable from the prior study. Disc levels: Mild loss of disc height at C4-C5. Moderate loss disc height at C5-C6. Spondylotic disc bulging and endplate spurring noted at these levels. Mild neural foraminal narrowing at these levels. Facet degenerative changes noted bilaterally. Findings are stable. Upper chest: No acute abnormality. Other: None IMPRESSION: HEAD CT:  No acute intracranial abnormalities.  No skull fracture. CERVICAL CT:  No fracture or acute finding. Electronically Signed   By: Lajean Manes M.D.   On: 09/19/2016 19:06   Ct Cervical Spine Wo Contrast  Result Date:  09/19/2016 CLINICAL DATA:  Per EMS, patient from home, c/o lower back pain after fall today while trying to get to the door. Denies head injury and LOC. Hx Parkinson's and chronic cellulitis to lower extremities. AANDOx4. EXAM: CT HEAD WITHOUT CONTRAST CT CERVICAL SPINE WITHOUT CONTRAST TECHNIQUE: Multidetector CT imaging of the head and cervical spine was performed following the standard protocol without intravenous contrast. Multiplanar CT image reconstructions of the cervical spine were also generated. COMPARISON:  06/10/2016 FINDINGS: CT HEAD FINDINGS Brain: The ventricles are normal in size and configuration. There are no parenchymal masses or mass effect. An area of encephalomalacia along the lateral left frontal lobe reflects an old infarct, stable. There is no evidence of an infarct. There are no extra-axial masses or abnormal fluid collections. There is no intracranial hemorrhage. Vascular: No hyperdense vessel or unexpected calcification. Skull: Normal. Negative for fracture or focal lesion. Sinuses/Orbits: Globes orbits are unremarkable. Visualized sinuses and mastoid air cells are clear. Other: There multiple stable partly calcified scalp lesions. CT CERVICAL SPINE FINDINGS Alignment: Normal. Skull base and vertebrae: No acute fracture. No primary bone lesion or focal pathologic process. Soft tissues and spinal canal: No prevertebral fluid or swelling. No visible canal hematoma. Heterogeneous enlarged left thyroid lobe, with multiple nodules, mildly deviates the trachea to the right, stable from the prior study. Disc levels: Mild loss of disc height at C4-C5. Moderate loss disc height at C5-C6. Spondylotic disc bulging and endplate spurring noted at these levels. Mild neural foraminal narrowing at these levels. Facet degenerative changes noted bilaterally. Findings are stable. Upper chest: No acute abnormality. Other: None IMPRESSION: HEAD CT:  No acute intracranial abnormalities.  No skull fracture.  CERVICAL CT:  No fracture or acute finding. Electronically Signed   By: Lajean Manes M.D.   On: 09/19/2016 19:06   Dg Shoulder Left  Result Date: 09/19/2016 CLINICAL DATA:  Pt c/o bilateral hip pain (L>R) and left shoulder pain after fall today in home. No known left shoulder surgery. EXAM: LEFT SHOULDER - 2+ VIEW COMPARISON:  None.  FINDINGS: No fracture or dislocation. Moderate AC joint osteoarthritis. Bones are diffusely demineralized. Soft tissues are unremarkable. IMPRESSION: No fracture, dislocation or acute finding. Electronically Signed   By: Lajean Manes M.D.   On: 09/19/2016 18:49   Dg Hips Bilat With Pelvis 3-4 Views  Result Date: 09/19/2016 CLINICAL DATA:  Pt c/o bilateral hip pain (L>R) and left shoulder pain after fall today in home. Hx of right hip replacement. EXAM: DG HIP (WITH OR WITHOUT PELVIS) 3-4V BILAT COMPARISON:  None. FINDINGS: No acute fracture. Right hip hemiarthroplasty is well-seated and aligned. Left hip joint, SI joints and symphysis pubis are normally spaced and aligned. Bones are diffusely demineralized.  No bone lesion. Soft tissues are unremarkable. IMPRESSION: 1. No fracture or dislocation. No evidence of loosening of the right hip prosthesis. Electronically Signed   By: Lajean Manes M.D.   On: 09/19/2016 18:48    Procedures Procedures (including critical care time)  Medications Ordered in ED Medications - No data to display   Initial Impression / Assessment and Plan / ED Course  I have reviewed the triage vital signs and the nursing notes.  Pertinent labs & imaging results that were available during my care of the patient were reviewed by me and considered in my medical decision making (see chart for details).  Clinical Course      Final Clinical Impressions(s) / ED Diagnoses   Final diagnoses:  Fall  Fall, initial encounter    Labs: CBC, BMP, urinalysis   Imaging:CT head without contrast, CT cervical spine, DG ribs bilateral, DG  shoulder  Consults:  Therapeutics:  Discharge Meds:   Assessment/Plan:  75 year old female presents status post fall. Patient has difficulty recalling events surrounding, she denies any acute syncope. Patient having vague musculoskeletal complaints, no acute bony abnormalities on plain films. Patient was originally ambulated in the hall and had difficulty, she was again ambulated and appeared to be at her baseline gait. Basic labs showed no significant findings. I consult case management who reviewed chart with me, patient has home health, I will reestablish physical therapy with her at home.   I spoke with patient's brother over the phone and informed him of all relevant information from workup, he is coming to pick up patient here in the ED.  Upon final reassessment patient reports lower lumbar pain, tenderness to palpation lower lumbar spine,plain films will be ordered.  Plain films with no acute findings- Brother here to take patient home    New Prescriptions New Prescriptions   No medications on file         Okey Regal, PA-C 09/20/16 0108    Carmin Muskrat, MD 09/20/16 [phone]

## 2016-09-19 NOTE — Care Management Note (Addendum)
Case Management Note  Patient Details  Name: [name] MRN: [id number] Date of Birth: September 05, 1941  Subjective/Objective:  Patient presents to ED with fall from home.                    Action/Plan: EDCM spoke to patient at bedside.  Patient reports she lives at home alone.  She reports she has an aide who comes to her home 3 hours a day from Monday to Friday.  Her aide assist her with ADL's, takes her  To doctor appointments, cleaning etc.  She reports her brother comes to see her every day after work. She reports she has a walker at home and a shower chair.  She reports she is able to get to her bathroom with her walker without difficulty.  She reports she does not need any further dme.  She reports she as had home health services with [name] in the past and would like to have services with this agency again. Patient wondering when she can go home.  Discussed patient with EDPA. Patient is active with [facility].   Expected Discharge Date:                  Expected Discharge Plan:  [place]  In-House Referral:     Discharge planning Services  CM Consult  Post Acute Care Choice:  Home Health Choice offered to:  Patient  DME Arranged:   (none needed per patient) DME Agency:     HH Arranged:  RN, PT, OT, Nurse's Aide, Social Work [employer] Agency:  [employer] (now [name])  Status of Service:  Completed, signed off  If discussed at [employer], dates discussed:    Additional CommentsLivia Snellen, RN 09/19/2016, 9:11 PM

## 2016-09-19 NOTE — ED Triage Notes (Addendum)
Per EMS, patient from home, c/o lower back pain after fall today while trying to get to the door. Denies head injury and LOC. Hx Parkinson's and chronic cellulitis to lower extremities. A&Ox4.

## 2016-09-19 NOTE — ED Notes (Signed)
EDPA JEFF Provider at bedside. 

## 2016-09-19 NOTE — ED Notes (Signed)
Pt. Ambulated to bathroom with walker and back to her bed. Pt.gait unsteady and stutter steping.

## 2016-09-19 NOTE — ED Notes (Signed)
Pt. Unable to give sample.

## 2016-09-19 NOTE — Progress Notes (Signed)
Location:  [facility] clinic Provider:  Jewelene Mairena L. Mariea Clonts, D.O., C.M.D.  Code Status: DNR Goals of Care:  Advanced Directives 08/23/2016  Does Patient Have a Medical Advance Directive? Yes  Type of Advance Directive [place]  Does patient want to make changes to medical advance directive? -  Copy of [place] in Chart? Yes  Would patient like information on creating a medical advance directive? -  Pre-existing out of facility DNR order (yellow form or pink MOST form) -     Chief Complaint  Patient presents with  . Medical Management of Chronic Issues    1 mth follow-up    [name] seen today for medical management of chronic diseases.    Is taking her bp medication, metoprolol succinate once daily.  It's a little high today.  Says her bp cuff says error error due to her tremor.  She denies sob, but she is slightly wheezing.  She still has an inhaler at home, but it has been so long that it fell off her med list.    She is warm and now she is sleepy.    Bowels are moving with the colace.  Legs:  Always red.  Tender today along anterior sock lines.  Has not been faithfully getting the cream mix on her legs--her caregiver says she will make sure that happens.  She has some little friends that come see her.  They are children that come first thing in the morning and in the evening.  All are friendly.  Not scary.  One reminds her of her nephew when he was 61 years old.  They do not speak and they disappear if she starts to talk to them. She even had a hallucination that her therapist was there but she was not.    Continues to have soreness in her mouth and down the left side of her throat.  Has multiple caries and rotten teeth and needs dental work for possible abscess in lower jaw.  She has gained six lbs since her last visit, but abdomen appears larger and she reports eating a lot and well.  She has not been eating more salt  and there is no increase in edema.    Past Medical History:  Diagnosis Date  . Acute on chronic systolic heart failure ([place])   . Anemia   . Anxiety   . Arthritis   . Cataract left  . Cellulitis and abscess of leg, except foot   . Cellulitis of lower leg 12/24/2011  . CHF (congestive heart failure) ([place])   . Chronic bronchitis ([place])    "usually get it q yr"  . Chronic systolic heart failure ([place])   . GERD (gastroesophageal reflux disease)   . Glaucoma left  . Helicobacter pylori (H. pylori)   . History of bleeding peptic ulcer   . History of blood transfusion    "don't remember why"  . Hypercholesterolemia   . Hypertension   . Hypotension, unspecified   . Hypothyroidism   . Parkinson's disease   . Pneumonia   . Sleep apnea    "had test years ago; insurance wouldn't pay for mask so I never had one" (04/27/2015)  . Stroke [facility]) X 3   "that  was the reason I got Parkinson's"    Past Surgical History:  Procedure Laterality Date  . BACK SURGERY    . ESOPHAGOGASTRODUODENOSCOPY  09/26/2011   Procedure: ESOPHAGOGASTRODUODENOSCOPY (EGD);  Surgeon:  Zenovia Jarred, MD;  Location[facility] ENDOSCOPY;  Service: Gastroenterology;  Laterality: N/A;  To be done at bedside.  . ESOPHAGOGASTRODUODENOSCOPY N/A 09/30/2013   Procedure: ESOPHAGOGASTRODUODENOSCOPY (EGD);  Surgeon: [name], MD;  Location: [place];  Service: Endoscopy;  Laterality: N/A;  . ESOPHAGOGASTRODUODENOSCOPY N/A 11/25/2013   Procedure: ESOPHAGOGASTRODUODENOSCOPY (EGD);  Surgeon: [name], MD;  Location: [facility] ENDOS[facility];  Service: Endoscopy;  Laterality: N/A;  . HEMIARTHROPLASTY HIP Right 09/24/2007   Archie Endo 01/23/2011  . KYPHOPLASTY  06/2009   T12/notes 07/17/2009  . NISSEN FUNDOPLICATION     "had OR for GERD"  . TENDON REPAIR     Gluteus medius Ar[facility] Endo 01/23/2011  . THYROIDECTOMY    . TONSILLECTOMY      Allergies  Allergen Reactions  . Codeine Other (See Comments)    Unknown allergic reaction  . Penicillins Other (See  Comments)    Has patient had a PCN reaction causing immediate rash, facial/tongue/throat swelling, SOB or lightheadedness with hypotension: No Has patient had a PCN reaction causing severe rash involving mucus membranes or skin necrosis: No Has patient had a PCN reaction that required hospitalization No Has patient had a PCN reaction occurring within the last 10 years: No If all of the above answers are "NO", then may proceed with Cephalosporin use. Pt passed out in doctor's office after penicillin dose    Allergies as of 09/19/2016      Reactions   Codeine Other (See Comments)   Unknown allergic reaction   Penicillins Other (See Comments)   Has patient had a PCN reaction causing immediate rash, facial/tongue/throat swelling, SOB or lightheadedness with hypotension: No Has patient had a PCN reaction causing severe rash involving mucus membranes or skin necrosis: No Has patient had a PCN reaction that required hospitalization No Has patient had a PCN reaction occurring within the last 10 years: No If all of the above answers are "NO", then may proceed with Cephalosporin use. Pt passed out in doctor's office after penicillin dose      Medication List       Accurate as of 09/19/16 11:53 AM. Always use your most recent med list.          aspirin EC 81 MG tablet Take 81 mg by mouth daily.   carbidopa-levodopa 25-100 MG tablet Commonly known as:  SINEMET IR Take 1 at 6a, 1 1/2 pills at 9a, 1 at 1p, 1 1/2 pills at 3p, 1 at 6pm, 1 1/2 pills at 9pm   docusate sodium 100 MG capsule Commonly known as:  COLACE Take 100 mg by mouth daily. Reported on 02/28/2016   ferrous sulfate 325 (65 FE) MG EC tablet Take 1 tablet (325 mg total) by mouth daily.   furosemide 40 MG tablet Commonly known as:  LASIX Take 1 tablet (40 mg total) by mouth daily.   latanoprost 0.005 % ophthalmic solution Commonly known as:  XALATAN Place 1 drop into the left eye daily at 6 PM.   LORazepam 0.5 MG  tablet Commonly known as:  ATIVAN Take 1 tablet (0.5 mg total) by mouth 2 (two) times daily as needed for anxiety.   metoprolol succinate 50 MG 24 hr tablet Commonly known as:  TOPROL-XL Take 1 tablet (50 mg total) by mouth daily.   multivitamin with minerals tablet Take 1 tablet by mouth daily.   nystatin cream Commonly known as:  MYCOSTATIN Apply 1 application topically 2 (two) times daily.   pramipexole 1 MG tablet Commonly known as:  MIRAPEX  Take 1 tablet (1 mg total) by mouth 3 (three) times daily.   SYSTANE OP Place 1 drop into both eyes daily.   triamcinolone cream 0.1 % Commonly known as:  KENALOG Apply 1 application topically 2 (two) times daily.       Review of Systems:  Review of Systems  Constitutional: Negative for chills, fever and malaise/fatigue.  HENT: Positive for sore throat.        Left lower jaw pain, caries, possible abscess (pt unable to open jaw well enough)  Respiratory: Positive for wheezing. Negative for cough and shortness of breath.   Cardiovascular: Negative for chest pain, palpitations and leg swelling.       Chronic erythema of bilateral lower legs  Gastrointestinal: Negative for abdominal pain, blood in stool, constipation and melena.  Genitourinary: Negative for dysuria.  Musculoskeletal: Negative for falls.  Neurological: Positive for tremors. Negative for dizziness, loss of consciousness and weakness.       Unsteady gait; she froze when standing up and could not move her left leg  Endo/Heme/Allergies: Does not bruise/bleed easily.  Psychiatric/Behavioral: Positive for hallucinations.       Of children--not bothersome to patient    Health Maintenance  Topic Date Due  . DEXA SCAN  06/06/2005  . ZOSTAVAX  09/22/2017 (Originally 06/06/2000)  . TETANUS/TDAP  09/22/2017 (Originally 06/07/1960)  . INFLUENZA VACCINE  Completed  . PNA vac Low Risk Adult  Completed    Physical Exam: Vitals:   09/19/16 1125  BP: (!) 148/80  Pulse: 68    Temp: 97.6 F (36.4 C)  TempSrc: Oral  SpO2: 99%  Weight: 176 lb (79.8 kg)   Body mass index is 30.21 kg/m. Physical Exam  Constitutional: She is oriented to person, place, and time.  Cardiovascular: Normal rate, regular rhythm and normal heart sounds.   Erythema chronically of bilateral LEs, tender at tops of socks, small excoriation just beneath right knee; multiple actinic keratoses present  Pulmonary/Chest: Effort normal and breath sounds normal. No respiratory distress.  Abdominal: Soft. Bowel sounds are normal. She exhibits no distension. There is no tenderness.  Musculoskeletal: Normal range of motion.  Tremors present  Neurological: She is alert and oriented to person, place, and time. She exhibits abnormal muscle tone.  cogwheeling rigidity; freezing of lower extremities with standing up from chair to use walker  Skin: Skin is warm and dry. There is erythema.  Psychiatric: She has a normal mood and affect.    Labs reviewed: Basic Metabolic Panel:  Recent Labs  05/25/16 1134  05/27/16 0316 05/29/16 0840 05/31/16 0325  06/10/16 0957 06/11/16 0419 06/12/16 0627  NA  --   < > 139 137 138  < > 143 143 141  K  --   < > 4.1 3.6 4.1  < > 3.9 4.0 3.6  CL  --   < > 102 100* 97*  < > 108 107 108  CO2  --   < > 32 25 29  < > 24 29 27   GLUCOSE  --   < > 93 158* 131*  < > 125* 103* 115*  BUN  --   < > 23* 28* 38*  < > 35* 20 18  CREATININE  --   < > 1.00 1.06* 1.15*  < > 0.96 0.84 0.83  CALCIUM  --   < > 9.1 8.8* 9.3  < > 9.5 8.7* 8.8*  MG  --   --  2.2 2.5* 2.6*  --   --   --   --  TSH 0.762  --   --   --   --   --   --   --   --   < > = values in this interval not displayed. Liver Function Tests:  Recent Labs  06/06/16 0935 06/10/16 0957  AST 16 22  ALT 7 13*  ALKPHOS 66 63  BILITOT 0.5 0.5  PROT 6.4 7.5  ALBUMIN 3.7 3.6   No results for input(s): LIPASE, AMYLASE in the last 8760 hours. No results for input(s): AMMONIA in the last 8760 hours. CBC:  Recent  Labs  03/11/16 1522  06/06/16 0935 06/10/16 0957 06/11/16 0419 06/12/16 0627  WBC 7.5  < > 11.6* 7.1 7.7 10.0  NEUTROABS 5.3  --  9,628* 4.9  --   --   HGB 10.1*  < > 10.5* 11.8* 10.0* 10.3*  HCT 33.8*  < > 32.2* 39.3 33.0* 34.2*  MCV 81.8  < > 73.5* 79.6 79.3 79.2  PLT 184  < > 167 190 192 183  < > = values in this interval not displayed.  Assessment/Plan 1. Dental abscess -still needs dental appt -treatment of otitis did not resolve the dental problem  2. Wheezing -audible across the room; not audible during lung exam though so suspect more upper airway -she has an inhaler at home, but has not needed it in some time -caregiver to check on date of it and pt instructed to use if she's feeling tight in the chest or wheezing -no rales present  3. Venous stasis dermatitis of both lower extremities -chronic and stable -cont mixed cream to prevent fungal infection and skin breakdown that leads to recurrent cellulitis  4. Essential hypertension -bp elevated this am, but has been fine for a long time  5. Parkinson's disease ([place]) -cont sinemet and mirapex and f/u with Dr. Rexene Alberts as scheduled -having hallucinations of small children, but would not treat as they do not bother her--she kind of enjoys them  6. Chronic systolic congestive heart failure (HCC) -weight up, but no other signs of acute exacerbation -no rales, jvd -suspect weight up due to food and increased clothing with very cold weather -cont toprol XL daily, lasix 40mg  daily and monitor weights and avoid high sodium foods (gets meals on wheels)  Labs/tests ordered:  No orders of the defined types were placed in this encounter.  Next appt:  11/01/2016--med mgt  Saxon Crosby L. Kimberlea Schlag, D.O. [facility] [address],  [address] Cell Phone (Mon-Fri 8am-5pm):  [phone] On Call:  [phone] & follow prompts after 5pm & weekends Office Phone:   [phone] Office Fax:  [phone]

## 2016-09-20 ENCOUNTER — Emergency Department (HOSPITAL_COMMUNITY): Payer: Medicare Other

## 2016-09-20 DIAGNOSIS — M25512 Pain in left shoulder: Secondary | ICD-10-CM | POA: Diagnosis not present

## 2016-09-20 DIAGNOSIS — M545 Low back pain: Secondary | ICD-10-CM | POA: Diagnosis not present

## 2016-09-20 NOTE — ED Notes (Signed)
Patient transported to X-ray 

## 2016-09-22 DIAGNOSIS — I872 Venous insufficiency (chronic) (peripheral): Secondary | ICD-10-CM | POA: Diagnosis not present

## 2016-09-22 DIAGNOSIS — M4722 Other spondylosis with radiculopathy, cervical region: Secondary | ICD-10-CM | POA: Diagnosis not present

## 2016-09-22 DIAGNOSIS — G8929 Other chronic pain: Secondary | ICD-10-CM | POA: Diagnosis not present

## 2016-09-22 DIAGNOSIS — I5022 Chronic systolic (congestive) heart failure: Secondary | ICD-10-CM | POA: Diagnosis not present

## 2016-09-22 DIAGNOSIS — G20 Parkinson's disease: Secondary | ICD-10-CM | POA: Diagnosis not present

## 2016-09-22 DIAGNOSIS — I110 Hypertensive heart disease with heart failure: Secondary | ICD-10-CM | POA: Diagnosis not present

## 2016-09-24 DIAGNOSIS — M4722 Other spondylosis with radiculopathy, cervical region: Secondary | ICD-10-CM | POA: Diagnosis not present

## 2016-09-24 DIAGNOSIS — I872 Venous insufficiency (chronic) (peripheral): Secondary | ICD-10-CM | POA: Diagnosis not present

## 2016-09-24 DIAGNOSIS — G8929 Other chronic pain: Secondary | ICD-10-CM | POA: Diagnosis not present

## 2016-09-24 DIAGNOSIS — I110 Hypertensive heart disease with heart failure: Secondary | ICD-10-CM | POA: Diagnosis not present

## 2016-09-24 DIAGNOSIS — G20 Parkinson's disease: Secondary | ICD-10-CM | POA: Diagnosis not present

## 2016-09-24 DIAGNOSIS — I5022 Chronic systolic (congestive) heart failure: Secondary | ICD-10-CM | POA: Diagnosis not present

## 2016-09-25 ENCOUNTER — Encounter: Payer: Self-pay | Admitting: *Deleted

## 2016-09-25 ENCOUNTER — Other Ambulatory Visit: Payer: Self-pay | Admitting: *Deleted

## 2016-09-25 ENCOUNTER — Other Ambulatory Visit: Payer: Self-pay | Admitting: Pharmacist

## 2016-09-25 NOTE — Patient Outreach (Signed)
[name]) Care Management  [name] Routine Home Visit 09/25/2016  ARYSS DIBLEY [birth year] [id number]  TUCKER BARTLETT is an 76 y.o. female well known to [place] CMfor multiple hospitalizations and safety concerns. [name] most recently admitted to the [name] 18[facility], 2017,after experiencing syncopal episode/ AMS at home. Patient was discharged to [name] for rehabiliation, and was subsequently discharged home from [facility] on July 05, 2016 with home health [facility]) services.[facility] CSW and [facility] pharmacyisalso actively involvedin [name]'s care for community resource/ medication adherenceevaluation. Patient and her personal care assistant, [name], are present today for home visit. HIPAA verified with patient in person today.  Today is a joint visit with [name] pharmacist, [name].  Primary purpose of today's visit is to complete filling of patient's prescription pill box medicines, review medications with patient, reinforce fall prevention and self-health management of chronic disease state of CHF.    -- Patient confirms that she attended PCP visit 09/19/16; confirms that she has and has been taking [name] dosage of metoprolol once daily.  Today's medications are easily located in patient's home in centralized medication bin created during previous [place] in-home visit.  Pill box visualized and appears to be filled accurately/ appropriately.  Medication reconciliation performed during today's in-home visit by myself and [facility] pharmacist [name].  Patient reports compliance with medications today, and states that her personal care assistant [name] and brother [name] assist with filling of her pill box for all prescription medications except Sinemet, Mirapex, and Lasix, which patient reports independently managing, using her smaller pill box that she carries with her at all times in her walker pouch, which is attached to her walker.  -- Patient reports [name] fall  09/19/16; states she woke up thinking that someone was breaking into her home, and doesn't remember what happened, stating, "I just fell."  Patient was taken to ED and no serious injury was incurred; patient denies LOC, denies dizziness with fall.  Fall risks/ prevention/ education was again reiterated with patient.  -- HH PT services through [name] at home was re-established by EDP as a result of ED visit for fall.  [name] is unsure if [name] RN services were re-established, but stated that she "would find out."  -- Self-health management of chronic disease state of CHF:  [name] is wearing bilateral compression stockings today, and it appears that she has minimal, if any bilateral LE edema; [name] requests that her compression stockings not be removed today, stating, "it takes an hour to get them on and off."  [name] that she believes her legs "are better."  [name] is unable to weigh at home due to her chronic disease state of Parkinson's disease, as she is unable to stand without assistance to obtain an accurate weight.  [name], and she appears to be at her baseline with shortness of breath with activity.  [name] at her overall baseline from previous [place] in-home visits, and denies concerns, questions, issues or problems today.  [name] in-home visit scheduled today for next month.  [name]  Objective:   BP 114/64   Pulse 64   Resp 16   SpO2 98%     Review of Systems  Constitutional: Negative.   Respiratory: Positive for shortness of breath. Negative for wheezing.        Baseline shortness  of breath with activity of moving around her home; no significant SOB when at rest  Cardiovascular: Positive for orthopnea. Negative for chest pain and leg swelling.       Patient is wearing bilateral compression stockings today; No bilateral  LE swelling noted today  Gastrointestinal: Negative for abdominal pain and nausea.  Genitourinary: Negative.   Musculoskeletal: Positive for falls.       Patient reports fall on 09/19/16; resulted in ED visit, no injury.  Patient states that she does not know why she fell; states, "I was half asleep and woke up and thought someone was breaking into my house."  Neurological: Positive for tremors. Negative for dizziness.       History of parkinson's disease  Psychiatric/Behavioral: Negative for depression. The patient is not nervous/anxious.     Physical Exam  Constitutional: She is oriented to person, place, and time. She appears well-developed and well-nourished. No distress.  Cardiovascular: Normal rate, regular rhythm and intact distal pulses.  Exam reveals distant heart sounds.   Pulses:      Radial pulses are 2+ on the right side, and 2+ on the left side.  Respiratory: Effort normal and breath sounds normal. No respiratory distress. She has no rales.  Coarse breath sounds; baseline SOB with activity  GI: Soft. Bowel sounds are normal.  Musculoskeletal: She exhibits no edema.  Neurological: She is alert and oriented to person, place, and time. Coordination abnormal.  Parkinson's disease  Skin: Skin is warm and dry. No rash noted.  Psychiatric: She has a normal mood and affect. Her behavior is normal. Judgment and thought content normal.    Encounter Medications:   Outpatient Encounter Prescriptions as of 09/25/2016  Medication Sig  . aspirin EC 81 MG tablet Take 81 mg by mouth daily.  . carbidopa-levodopa (SINEMET IR) 25-100 MG tablet Take 1 at 6a, 1 1/2 pills at 9a, 1 at 1p, 1 1/2 pills at 3p, 1 at 6pm, 1 1/2 pills at 9pm  . docusate sodium (COLACE) 100 MG capsule Take 100 mg by mouth daily. Reported on 02/28/2016  . ferrous sulfate 325 (65 FE) MG EC tablet Take 1 tablet (325 mg total) by mouth daily.  . furosemide (LASIX) 40 MG tablet Take 1 tablet (40 mg total) by mouth daily.  Marland Kitchen  latanoprost (XALATAN) 0.005 % ophthalmic solution Place 1 drop into the left eye daily at 6 PM.   . LORazepam (ATIVAN) 0.5 MG tablet Take 1 tablet (0.5 mg total) by mouth 2 (two) times daily as needed for anxiety.  . metoprolol succinate (TOPROL-XL) 50 MG 24 hr tablet Take 1 tablet (50 mg total) by mouth daily.  . Multiple Vitamins-Minerals (MULTIVITAMIN WITH MINERALS) tablet Take 1 tablet by mouth daily.  Marland Kitchen nystatin cream (MYCOSTATIN) Apply 1 application topically 2 (two) times daily.  Vladimir Faster Glycol-Propyl Glycol (SYSTANE OP) Place 1 drop into both eyes daily.   . pramipexole (MIRAPEX) 1 MG tablet Take 1 tablet (1 mg total) by mouth 3 (three) times daily.  Marland Kitchen triamcinolone cream (KENALOG) 0.1 % Apply 1 application topically 2 (two) times daily.   No facility-administered encounter medications on file as of 09/25/2016.     Functional Status:   In your present state of health, do you have any difficulty performing the following activities: 08/23/2016 07/09/2016  Hearing? N N  Vision? Y N  Difficulty concentrating or making decisions? Y N  Walking or climbing stairs? Y Y  Dressing or bathing? Y Y  Doing errands, shopping?  [name]  Preparing Food and eating ? Y Y  Using the Toilet? N N  In the past six months, have you accidently leaked urine? Y N  Do you have problems with loss of bowel control? Y N  Managing your Medications? Y N  Managing your Finances? Y N  Housekeeping or managing your Housekeeping? [name]  Some recent data might be hidden    Fall/Depression Screening:    PHQ 2/9 Scores 09/10/2016 08/23/2016 08/12/2016 07/09/2016 06/06/2016 05/14/2016 03/29/2016  PHQ - 2 Score 0 0 0 0 0 0 0    Assessment:  Safety issues with Vung's medicationsthat were identified during [place] in-home visit appear to be stable today, since patient's metoprolol has been switched to a once-daily dosage, and Tammela appears to have a good general understanding of the purpose, dosing, and  scheduling of her medications today.Vallie has intermittently kept her medications in one central location in her home, and today she is able to easily locate all of her medications and is using the centralized medication bin, as well as her weekly pill boxes. Sherray remains agreeable to explore options for medication management with [place] but is unsure of she wishes to use blister packaging for her medications. Shirly remains a high fall risk, and has experienced another fall.  Tynasia has a good support system through her brother and her personal care assistant.  Azie is committed to keeping her [name] follow up provider appointments, and agrees to contact her providers promptly for any [name] concerns, issues, or problems that arise.  Plan:   [name] take her medications as prescribed and will attend all scheduled provider appointments.  Blaikley will continueusing pill boxesfor all of Daylan's prescription medications.  [name] continue actively working with [facility] RN CM, and[facility] CSW and will consider the use of blister packaging for her medicatons.  [name] notify her providers for any concerns, issues, problems, or questions that arise.  Continued [facility] CM outreach for self-health management of chronic disease state of CHF, fall risk, and medication management issues to continue with routine home visit scheduled next month.  [name], RN, BSN, [employer] [facility] Care Management  [phone]

## 2016-09-25 NOTE — Patient Outreach (Signed)
[facility]) Care Management  [place]   09/25/2016  [name] [birth year] [id number]  Subjective:  Follow-up home visit with patient today, co-visit with [place].    Patient reports improved adherence with metoprolol succinate since PCP changed metoprolol tartrate twice daily to metoprolol succinate once daily.   Patient states denies questions about her medications at this time.  She reports she is taking medications from her pill planner.    Objective:   Encounter Medications: Outpatient Encounter Prescriptions as of 09/25/2016  Medication Sig  . aspirin EC 81 MG tablet Take 81 mg by mouth daily.  . carbidopa-levodopa (SINEMET IR) 25-100 MG tablet Take 1 at 6a, 1 1/2 pills at 9a, 1 at 1p, 1 1/2 pills at 3p, 1 at 6pm, 1 1/2 pills at 9pm  . ferrous sulfate 325 (65 FE) MG EC tablet Take 1 tablet (325 mg total) by mouth daily.  . furosemide (LASIX) 40 MG tablet Take 1 tablet (40 mg total) by mouth daily.  Marland Kitchen latanoprost (XALATAN) 0.005 % ophthalmic solution Place 1 drop into the left eye daily at 6 PM.   . metoprolol succinate (TOPROL-XL) 50 MG 24 hr tablet Take 1 tablet (50 mg total) by mouth daily.  . Multiple Vitamins-Minerals (MULTIVITAMIN WITH MINERALS) tablet Take 1 tablet by mouth daily.  Marland Kitchen nystatin cream (MYCOSTATIN) Apply 1 application topically 2 (two) times daily.  . pramipexole (MIRAPEX) 1 MG tablet Take 1 tablet (1 mg total) by mouth 3 (three) times daily.  Marland Kitchen triamcinolone cream (KENALOG) 0.1 % Apply 1 application topically 2 (two) times daily.  Marland Kitchen docusate sodium (COLACE) 100 MG capsule Take 100 mg by mouth daily. Reported on 02/28/2016  . LORazepam (ATIVAN) 0.5 MG tablet Take 1 tablet (0.5 mg total) by mouth 2 (two) times daily as needed for anxiety. (Patient not taking: Reported on 09/25/2016)  . Polyethyl Glycol-Propyl Glycol (SYSTANE OP) Place 1 drop into both eyes daily.    No facility-administered encounter medications on file as  of 09/25/2016.     Functional Status: In your present state of health, do you have any difficulty performing the following activities: 08/23/2016 07/09/2016  Hearing? N N  Vision? Y N  Difficulty concentrating or making decisions? Y N  Walking or climbing stairs? Y Y  Dressing or bathing? Y Y  Doing errands, shopping? [name]  Preparing Food and eating ? Y Y  Using the Toilet? N N  In the past six months, have you accidently leaked urine? Y N  Do you have problems with loss of bowel control? Y N  Managing your Medications? Y N  Managing your Finances? Y N  Housekeeping or managing your Housekeeping? [name]  Some recent data might be hidden    Fall/Depression Screening: PHQ 2/9 Scores 09/10/2016 08/23/2016 08/12/2016 07/09/2016 06/06/2016 05/14/2016 03/29/2016  PHQ - 2 Score 0 0 0 0 0 0 0    Assessment:  Medication adherence: Patient obtained fill of metoprolol succinate, fill date of 09/11/16 and reports taking it more regularly.    Medication blister packaging: Per previous request from patient and [facility] RN, reviewed options for blister packaging.  Placed phone calls to three local pharmacies, [facility], [facility] and [facility], all or which indicated they offer blister packaging of medications for no additional charge presently.   Provided patient with the information---she reports she isn't sure if she wishes to switch pharmacies yet.   Plan:  Will close pharmacy  case as she denies further pharmacy needs and adherence to metoprolol succinate has improved since PCP changed medication.   [place] is happy to assist if future pharmacy needs.    Will route update to PCP to make aware of pharmacy closure.    Karrie Meres, PharmD, [place] [phone]

## 2016-09-26 DIAGNOSIS — G20 Parkinson's disease: Secondary | ICD-10-CM | POA: Diagnosis not present

## 2016-09-26 DIAGNOSIS — I5022 Chronic systolic (congestive) heart failure: Secondary | ICD-10-CM | POA: Diagnosis not present

## 2016-09-26 DIAGNOSIS — I110 Hypertensive heart disease with heart failure: Secondary | ICD-10-CM | POA: Diagnosis not present

## 2016-09-26 DIAGNOSIS — I872 Venous insufficiency (chronic) (peripheral): Secondary | ICD-10-CM | POA: Diagnosis not present

## 2016-09-26 DIAGNOSIS — M4722 Other spondylosis with radiculopathy, cervical region: Secondary | ICD-10-CM | POA: Diagnosis not present

## 2016-09-26 DIAGNOSIS — G8929 Other chronic pain: Secondary | ICD-10-CM | POA: Diagnosis not present

## 2016-09-30 ENCOUNTER — Telehealth: Payer: Self-pay | Admitting: Nurse Practitioner

## 2016-09-30 NOTE — Telephone Encounter (Addendum)
I called pt to see what was going on.  She has had hallucinations off and on for long time.  She had fall when she was trying to get to the police at her door, when she thought someone was in her home (hallucination).  She did not break any bones.  She will come in 10-08-16.

## 2016-09-30 NOTE — Telephone Encounter (Signed)
Pt called to advise she did not have transportation for appt on 1/29 (Dr Baldo Ash is booked out into March). She is having increased hallucinations and she fell recently. An appt was scheduled with Hoyle Sauer for 10/08/16.

## 2016-10-01 DIAGNOSIS — G20 Parkinson's disease: Secondary | ICD-10-CM | POA: Diagnosis not present

## 2016-10-01 DIAGNOSIS — I5022 Chronic systolic (congestive) heart failure: Secondary | ICD-10-CM | POA: Diagnosis not present

## 2016-10-01 DIAGNOSIS — I872 Venous insufficiency (chronic) (peripheral): Secondary | ICD-10-CM | POA: Diagnosis not present

## 2016-10-01 DIAGNOSIS — I110 Hypertensive heart disease with heart failure: Secondary | ICD-10-CM | POA: Diagnosis not present

## 2016-10-01 DIAGNOSIS — M4722 Other spondylosis with radiculopathy, cervical region: Secondary | ICD-10-CM | POA: Diagnosis not present

## 2016-10-01 DIAGNOSIS — G8929 Other chronic pain: Secondary | ICD-10-CM | POA: Diagnosis not present

## 2016-10-03 DIAGNOSIS — G20 Parkinson's disease: Secondary | ICD-10-CM | POA: Diagnosis not present

## 2016-10-03 DIAGNOSIS — M4722 Other spondylosis with radiculopathy, cervical region: Secondary | ICD-10-CM | POA: Diagnosis not present

## 2016-10-03 DIAGNOSIS — G8929 Other chronic pain: Secondary | ICD-10-CM | POA: Diagnosis not present

## 2016-10-03 DIAGNOSIS — I110 Hypertensive heart disease with heart failure: Secondary | ICD-10-CM | POA: Diagnosis not present

## 2016-10-03 DIAGNOSIS — I872 Venous insufficiency (chronic) (peripheral): Secondary | ICD-10-CM | POA: Diagnosis not present

## 2016-10-03 DIAGNOSIS — I5022 Chronic systolic (congestive) heart failure: Secondary | ICD-10-CM | POA: Diagnosis not present

## 2016-10-04 ENCOUNTER — Telehealth: Payer: Self-pay

## 2016-10-04 DIAGNOSIS — I110 Hypertensive heart disease with heart failure: Secondary | ICD-10-CM | POA: Diagnosis not present

## 2016-10-04 DIAGNOSIS — I872 Venous insufficiency (chronic) (peripheral): Secondary | ICD-10-CM | POA: Diagnosis not present

## 2016-10-04 DIAGNOSIS — G20 Parkinson's disease: Secondary | ICD-10-CM | POA: Diagnosis not present

## 2016-10-04 DIAGNOSIS — I5022 Chronic systolic (congestive) heart failure: Secondary | ICD-10-CM | POA: Diagnosis not present

## 2016-10-04 DIAGNOSIS — G8929 Other chronic pain: Secondary | ICD-10-CM | POA: Diagnosis not present

## 2016-10-04 DIAGNOSIS — M4722 Other spondylosis with radiculopathy, cervical region: Secondary | ICD-10-CM | POA: Diagnosis not present

## 2016-10-04 NOTE — Telephone Encounter (Signed)
Fredrich Birks with [facility] Department called to inform Dr.Reed that patient is eligible for home services that include help with bathing. Patient applied for assistance a while back.  [name] requested verbal orders for nursing evaluation. Once evaluation complete a plan of care will be faxed to Dr.Reed for review and signature.  Per [employer] standing order, verbal order given. Message will be sent to patient's provider as a FYI.    [name] requested a copy of medication list and problem list to be faxed to [phone]. Faxed

## 2016-10-07 ENCOUNTER — Other Ambulatory Visit: Payer: Self-pay | Admitting: Licensed Clinical Social Worker

## 2016-10-07 DIAGNOSIS — M4722 Other spondylosis with radiculopathy, cervical region: Secondary | ICD-10-CM | POA: Diagnosis not present

## 2016-10-07 DIAGNOSIS — I110 Hypertensive heart disease with heart failure: Secondary | ICD-10-CM | POA: Diagnosis not present

## 2016-10-07 DIAGNOSIS — I5022 Chronic systolic (congestive) heart failure: Secondary | ICD-10-CM | POA: Diagnosis not present

## 2016-10-07 DIAGNOSIS — G20 Parkinson's disease: Secondary | ICD-10-CM | POA: Diagnosis not present

## 2016-10-07 DIAGNOSIS — I872 Venous insufficiency (chronic) (peripheral): Secondary | ICD-10-CM | POA: Diagnosis not present

## 2016-10-07 DIAGNOSIS — G8929 Other chronic pain: Secondary | ICD-10-CM | POA: Diagnosis not present

## 2016-10-07 NOTE — Patient Outreach (Signed)
[facility]) Care Management  10/07/2016  [name] March 12, 1941 [id number]  Assessment- CSW completed outreach call to patient. She answered. She reports that her aide went grocery shopping for her today. She states that she is feeling "fine." She is agreeable to home visit this week.  Plan-CSW will complete home visit on 10/10/16.  [name], BSW, MSW, [email or web address] Phone: [phone] Fax: [phone]

## 2016-10-08 ENCOUNTER — Ambulatory Visit (INDEPENDENT_AMBULATORY_CARE_PROVIDER_SITE_OTHER): Payer: Medicare Other | Admitting: Nurse Practitioner

## 2016-10-08 ENCOUNTER — Encounter: Payer: Self-pay | Admitting: Nurse Practitioner

## 2016-10-08 VITALS — BP 160/94 | HR 60 | Ht 64.0 in | Wt 177.2 lb

## 2016-10-08 DIAGNOSIS — G20 Parkinson's disease: Secondary | ICD-10-CM

## 2016-10-08 DIAGNOSIS — I5022 Chronic systolic (congestive) heart failure: Secondary | ICD-10-CM | POA: Diagnosis not present

## 2016-10-08 DIAGNOSIS — R269 Unspecified abnormalities of gait and mobility: Secondary | ICD-10-CM | POA: Diagnosis not present

## 2016-10-08 DIAGNOSIS — I872 Venous insufficiency (chronic) (peripheral): Secondary | ICD-10-CM | POA: Diagnosis not present

## 2016-10-08 DIAGNOSIS — I110 Hypertensive heart disease with heart failure: Secondary | ICD-10-CM | POA: Diagnosis not present

## 2016-10-08 DIAGNOSIS — R443 Hallucinations, unspecified: Secondary | ICD-10-CM

## 2016-10-08 DIAGNOSIS — M4722 Other spondylosis with radiculopathy, cervical region: Secondary | ICD-10-CM | POA: Diagnosis not present

## 2016-10-08 DIAGNOSIS — G8929 Other chronic pain: Secondary | ICD-10-CM | POA: Diagnosis not present

## 2016-10-08 DIAGNOSIS — I10 Essential (primary) hypertension: Secondary | ICD-10-CM

## 2016-10-08 NOTE — Progress Notes (Addendum)
[facility]  PATIENT: [name] DOB: 1941/01/13   REASON FOR VISIT: Follow-up for Parkinson's disease, gait abnormality, new complaint of hallucinations HISTORY FROM: Patient and c/g     HISTORY OF PRESENT ILLNESS: HISTORY [name]. [name] is a 76 year old right-handed woman with an underlying complex medical history of hypertension, arthritis, anemia, cellulitis, chronic systolic heart failure, peptic ulcer disease, and chronic low back pain with history of T12 compression fracture, status post kyphoplasty in October 2010, who presents for follow-up consultation of her Parkinson's disease, complicated by motor complications, motor fluctuations and recurrent falls, deconditioning, recent cellulitis.The patient is unaccompanied today. [name] her live-in caretaker moved to [facility] to be with her parents, [name] says. I last saw her on 10/10/2015, at which time the patient reported feeling stable. [name] reported no recent falls. She did report of recent fall about 3 weeks prior in the kitchen, indicating that she let go of her walker and fell backwards. She did not hurt her self, she had ongoing issues with severe dyskinesias and some visual hallucinations. She had lower extremity swelling. She had been using oxycodone as needed for low back pain. I've reduced her Mirapex because of lower extremity swelling. She was advised to continue with Sinemet, one pills 6 times a day.    05/17/2017SA: She reports that the increase in C/L helped with the freezing. She takes 1 pill, alternating with 1 1/2 pills, every 3 hours, starting at 6 AM and ending at 9 PM. She did fall recently as she was getting out of her new recliner and thankfully, [name] was there, her nephew's [name]) wife. She checks on patient morning and evening. Her brother ([name]'s dad) lives in [place], her other brother (in [place]) brings her supper and for lunch she has meals on wheels. She was seen in the interim by her  primary care physician on 01/26/2016 and was noted to have right lower extremity cellulitis. She was supposed to complete dual antibiotic treatment. She needed more supervision at the house and a social work referral was done. I increased her Sinemet in the interim to 1 pill alternating with 1-1/2 pills. She called in late April reporting more freezing.  UPDATE 06/18/2016 CM [name], 77 year old female returns for follow-up for Parkinson's disease. She was admitted to the hospital on 06/10/16  due to syncopal episode. She had a previous hospitalization for a week earlier in the month for acute on chronic congestive heart failure. Apparently she fell at home and was found on the floor by caregiver brought to the ER. Etiology was unclear orthostatics were negative imaging on admission was negative for acute findings. She was discharged to a skilled facility for rehabilitation which she is getting physical therapy every day. In terms of her Parkinson's she is doing fairly well. She does not always get her medication on time at the facility. She denies any freezing spells she returns for reevaluation UPDATE 11/21/2017CM Ms. [name], 76 year old female returns for follow-up. She has a history of Parkinson's disease complicated by motor complications, motor fluctuations and recurrent falls, deconditioning. She is back at home and has a caregiver 3 hours a day.  She has had 2 hospital admissions for  Congestive heart failure since last seen. She is not  always compliant with taking her Sinemet on time. Both lower extremities are edematous and pink to touch. She was seen by her primary care yesterday who ordered Lasix twice daily for 3 days however she has not started that. She continues to  get physical therapy. She complains with pain/weakness  in the lower extremities. She stats in the  recliner most of the time. She was getting occupational therapy but  this concluded she does not do her home exercise program and was  encouraged to do so. She has wearing off phenomena about 15 minutes before her next Sinemet dose is due according to the caregiver . I see no tremor today.She returns for reevaluation. She has [place] . UPDATE 01/16/2018CM Ms. [name], 76 year old female returns for follow-up. She has history of Parkinson's disease complicated by motor fluctuations and recurrent falls deconditioning. She had 3-4 falls a month ago and was seen in the emergency room. Physical therapy was ordered at that time and continues. She feels it is beneficial. She ambulates with a rolling walker. She has a caregiver 3 hours a day. She does not always take her carbidopa levodopa at the time that are ordered. Caregiver reports that she is having hallucinations most every morning upon awakening.. 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 Discussed going on a medication but due to the stroke risk she is hesitant to do so.  REVIEW OF SYSTEMS: Full 14 system review of systems performed and notable only for those listed, all others are neg:  Constitutional:Fatigue  Cardiovascular:Bilateral edematous lower extremities  Ear/Nose/Throat: neg  Skin: neg Eyes: neg Respiratory:Shortness of breath  Gastroitestinal: neg  Hematology/Lymphatic: neg  Endocrine: neg Musculoskeletal: Gait abnormality, Allergy/Immunology: neg Neurological: neg Psychiatric: neg Sleep : neg   ALLERGIES: Allergies  Allergen Reactions  . Codeine Other (See Comments)    Unknown allergic reaction  . Penicillins Other (See Comments)    Has patient had a PCN reaction causing immediate rash, facial/tongue/throat swelling, SOB or lightheadedness with hypotension: No Has patient had a PCN reaction causing severe rash involving mucus membranes or skin necrosis: No Has patient had a PCN reaction that required hospitalization No Has patient had a PCN reaction occurring within the last 10 years: No If all of the above answers are  "NO", then may proceed with Cephalosporin use. Pt passed out in doctor's office after penicillin dose    HOME MEDICATIONS: Outpatient Medications Prior to Visit  Medication Sig Dispense Refill  . aspirin EC 81 MG tablet Take 81 mg by mouth daily.    . carbidopa-levodopa (SINEMET IR) 25-100 MG tablet Take 1 at 6a, 1 1/2 pills at 9a, 1 at 1p, 1 1/2 pills at 3p, 1 at 6pm, 1 1/2 pills at 9pm 225 tablet 6  . docusate sodium (COLACE) 100 MG capsule Take 100 mg by mouth daily. Reported on 02/28/2016    . ferrous sulfate 325 (65 FE) MG EC tablet Take 1 tablet (325 mg total) by mouth daily. 90 tablet 3  . furosemide (LASIX) 40 MG tablet Take 1 tablet (40 mg total) by mouth daily.  0  . latanoprost (XALATAN) 0.005 % ophthalmic solution Place 1 drop into the left eye daily at 6 PM.     . LORazepam (ATIVAN) 0.5 MG tablet Take 1 tablet (0.5 mg total) by mouth 2 (two) times daily as needed for anxiety. 20 tablet 0  . metoprolol succinate (TOPROL-XL) 50 MG 24 hr tablet Take 1 tablet (50 mg total) by mouth daily. 30 tablet 5  . Multiple Vitamins-Minerals (MULTIVITAMIN WITH MINERALS) tablet Take 1 tablet by mouth daily.    Marland Kitchen nystatin cream (MYCOSTATIN) Apply 1 application topically 2 (two) times daily.    Vladimir Faster Glycol-Propyl Glycol (SYSTANE OP)  Place 1 drop into both eyes daily.     . pramipexole (MIRAPEX) 1 MG tablet Take 1 tablet (1 mg total) by mouth 3 (three) times daily. 270 tablet 3  . triamcinolone cream (KENALOG) 0.1 % Apply 1 application topically 2 (two) times daily.     No facility-administered medications prior to visit.     PAST MEDICAL HISTORY: Past Medical History:  Diagnosis Date  . Acute on chronic systolic heart failure ([place])   . Anemia   . Anxiety   . Arthritis   . Cataract left  . Cellulitis and abscess of leg, except foot   . Cellulitis of lower leg 12/24/2011  . CHF (congestive heart failure) ([place])   . Chronic bronchitis ([place])    "usually get it q yr"  . Chronic systolic  heart failure ([place])   . GERD (gastroesophageal reflux disease)   . Glaucoma left  . Helicobacter pylori (H. pylori)   . History of bleeding peptic ulcer   . History of blood transfusion    "don't remember why"  . Hypercholesterolemia   . Hypertension   . Hypotension, unspecified   . Hypothyroidism   . Parkinson's disease   . Pneumonia   . Sleep apnea    "had test years ago; insurance wouldn't pay for mask so I never had one" (04/27/2015)  . Stroke [facility]) X 3   "that  was the reason I got Parkinson's"    PAST SURGICAL HISTORY: Past Surgical History:  Procedure Laterality Date  . BACK SURGERY    . ESOPHAGOGASTRODUODENOSCOPY  09/26/2011   Procedure: ESOPHAGOGASTRODUODENOSCOPY (EGD);  Surgeon: Zenovia Jarred, MD;  [facility] ENDOSCOPY;  Service: Gastroenterology;  Laterality: N/A;  To be done at bedside.  . ESOPHAGOGASTRODUODENOSCOPY N/A 09/30/2013   Procedure: ESOPHAGOGASTRODUODENOSCOPY (EGD);  Surgeon: [name], MD;  Location: [facility];  Service: Endoscopy;  Laterality: N/A;  . ESOPHAGOGASTRODUODENOSCOPY N/A 11/25/2013   Procedure: ESOPHAGOGASTRODUODENOSCOPY (EGD);  Surgeon: [name], MD;  Location: [facility] ENDOSCOPY;  Service: Endoscopy;  Laterality: N/A;  . HEMIARTHROPLASTY HIP Right 09/24/2007   Archie Endo 01/23/2011  . KYPHOPLASTY  06/2009   T12/notes 07/17/2009  . NISSEN FUNDOPLICATION     "had OR for GERD"  . TENDON REPAIR     Gluteus medius [facility] 01/23/2011  . THYROIDECTOMY    . TONSILLECTOMY      FAMILY HISTORY: Family History  Problem Relation Age of Onset  . GER disease Mother   . Heart disease Father     SOCIAL HISTORY: Social History   Social History  . Marital status: Single    Spouse name: N/A  . Number of children: 0  . Years of education: 12   Occupational History  .      Retired   Social History Main Topics  . Smoking status: Never Smoker  . Smokeless tobacco: Never Used  . Alcohol use No  . Drug use: No  . Sexual activity: No   Other Topics  Concern  . Not on file   Social History Narrative   Patient lives at home alone. Patient is retired. Patient has high school education.   Caffeine- one daily.   Right handed.     PHYSICAL EXAM  Vitals:   10/08/16 1035  BP: (!) 160/94  Pulse: 60  Weight: 177 lb 3.2 oz (80.4 kg)  Height: 5\' 4"  (1.626 m)   Body mass index is 30.42 kg/m.  Generalized: Well developed, Pleasant female in no acute distress  Head: normocephalic and atraumatic,.  Oropharynx benign  Neck: Supple, no carotid bruits  Cardiac: Regular rate rhythm, no murmur  Musculoskeletal: Arthritic changes in the hands Skin  Both lower extremities pedal edema and  Swelling to mid Shin  Neurological examination   Mentation: Alert oriented to time, place, history taking. Attention span and concentration appropriate. Recent and remote memory intact.  Follows all commands speech is hypophonic and language fluent.   Cranial nerve II-XII: Pupils were equal round reactive to light extraocular movements were full, visual field were full on confrontational test. There is mild limitation to upgaze. There is mild decreased eye blink. Mild masking of the face with normal facial sensation Facial sensation and strength were normal. hearing was intact to finger rubbing bilaterally. Uvula tongue midline. Tongue protrusion into cheek strength was normal. Motor: normal bulk and tone, full strength in the BUE, BLE,  mildly reduced tone no cogwheeling. Moderate bradykinesia no pronator drift. No resting tremor  Sensory: normal and symmetric to light touch, in the upper and lower extremities Coordination: finger-nose-finger, heel-to-shin bilaterally, no dysmetria Reflexes: Symmetric upper and lower plantar responses were flexor bilaterally. Gait and Station: Ambulated 60 feet in hall with rolling walker, no difficulty with turns, no short steppage  DIAGNOSTIC DATA (LABS, IMAGING, TESTING) - I reviewed patient records, labs, notes, testing  and imaging myself where available.  Lab Results  Component Value Date   WBC 8.7 09/19/2016   HGB 12.8 09/19/2016   HCT 39.0 09/19/2016   MCV 85.3 09/19/2016   PLT 154 09/19/2016      Component Value Date/Time   NA 143 09/19/2016 2212   NA 142 02/15/2016 1506   K 4.2 09/19/2016 2212   CL 111 09/19/2016 2212   CO2 27 09/19/2016 2212   GLUCOSE 104 (H) 09/19/2016 2212   BUN 17 09/19/2016 2212   BUN 21 02/15/2016 1506   CREATININE 0.80 09/19/2016 2212   CREATININE 1.68 (H) 06/06/2016 0935   CALCIUM 8.5 (L) 09/19/2016 2212   PROT 7.5 06/10/2016 0957   PROT 5.8 (L) 04/03/2015 1350   ALBUMIN 3.6 06/10/2016 0957   ALBUMIN 3.8 04/03/2015 1350   AST 22 06/10/2016 0957   ALT 13 (L) 06/10/2016 0957   ALKPHOS 63 06/10/2016 0957   BILITOT 0.5 06/10/2016 0957   BILITOT 0.4 04/03/2015 1350   GFRNONAA >60 09/19/2016 2212   GFRAA >60 09/19/2016 2212    Lab Results  Component Value Date   TSH 0.762 05/25/2016      ASSESSMENT AND PLAN 76 year old female with an underlying complex medical history of hypertension, arthritis, anemia,  chronic systolic heart failure and cor pulmonale, peptic ulcer disease, and chronic low back pain with history of T12 compression fracture, status post kyphoplasty in October 2010, who presents for follow-up for  her parkinsonism with Sx dating back to 1999. Her history and physical exam are in keeping with, left-sided predominant PD, complicated by dyskinesias, overall frailty,  freezing and recurrent falls. She is currently receiving physical therapy.  She also has bilateral lower extremity edema she is on Lasix .     PLAN: Continue Sinemet and Mirapex at current doses. Please take at prescribed times. Continue Pt and then HEP when discharged Keep legs elevated when seated  Take your lasix as prescribed to decrease swelling in legs Unless hallucinations become bothersome we will not order any meds, made aware of black box warning for Seroquel I feel the  patient needs more in-home assistance she only gets 3 hours a day and I'm not  sure that she is taking her medications appropriately, patient does not agree F/U with Dr. Rexene Alberts in 2 months  Spent 25 minutes in total face-to-face time with the patient, and her caregiver, more than 50% of which was spent in counseling and coordination of care, reviewing recent ER visits,  reviewing medication and discussing and  reviewing the diagnosis of PD, its prognosis and treatment options. Also discussed hallucinations and treatment options Dennie Bible, [facility], [facility], APRN  [facility] [address], [place], [place] [address] [phone]  I reviewed the above note and documentation by the Nurse Practitioner and agree with the history, physical exam, assessment and plan as outlined above. I was immediately available for face-to-face consultation. Star Age, MD, PhD [facility])

## 2016-10-08 NOTE — Patient Instructions (Signed)
Take Sinemet at times prescribed Unless hallucinations become bothersome we will not order any meds Continue PT and then home exercise program after concluded Follow up in 2 months with Dr. Rexene Alberts

## 2016-10-09 ENCOUNTER — Other Ambulatory Visit: Payer: Self-pay | Admitting: Licensed Clinical Social Worker

## 2016-10-09 NOTE — Patient Outreach (Signed)
[facility]) Care Management  10/09/2016  [name] [birth year] [id number]   Assessment- CSW completed outreach to patient. Patient answered. CSW informed patient that CSW may not be able to travel the roads due to the inclement weather tomorrow for their scheduled home visit. Patient expresses understanding and is agreeable to reschedule appointment due to weather tomorrow if needed. CSW will contact tomorrow to update her. CSW complete quick review of local community resources for seniors.  Plan-CSW will follow up tomorrow with patient.  [name], BSW, MSW, [name] Phone: [phone] Fax: [phone]

## 2016-10-10 ENCOUNTER — Other Ambulatory Visit: Payer: Self-pay | Admitting: Licensed Clinical Social Worker

## 2016-10-10 NOTE — Patient Outreach (Signed)
[facility]) Care Management  10/10/2016  [name] 02-Feb-1941 [id number]   Assessment- CSW completed outreach call to patient. Patient was informed that CSW will not be able to complete home visit today due to the inclement weather. Patient wishes to reschedule appointment at a different time.  Plan-CSW will make outreach within one week to reschedule home visit appointment.  [name], BSW, MSW, [email or web address] Phone: [phone] Fax: [phone]

## 2016-10-11 DIAGNOSIS — G20 Parkinson's disease: Secondary | ICD-10-CM | POA: Diagnosis not present

## 2016-10-11 DIAGNOSIS — I872 Venous insufficiency (chronic) (peripheral): Secondary | ICD-10-CM | POA: Diagnosis not present

## 2016-10-11 DIAGNOSIS — G8929 Other chronic pain: Secondary | ICD-10-CM | POA: Diagnosis not present

## 2016-10-11 DIAGNOSIS — I5022 Chronic systolic (congestive) heart failure: Secondary | ICD-10-CM | POA: Diagnosis not present

## 2016-10-11 DIAGNOSIS — I110 Hypertensive heart disease with heart failure: Secondary | ICD-10-CM | POA: Diagnosis not present

## 2016-10-11 DIAGNOSIS — M4722 Other spondylosis with radiculopathy, cervical region: Secondary | ICD-10-CM | POA: Diagnosis not present

## 2016-10-12 DIAGNOSIS — G8929 Other chronic pain: Secondary | ICD-10-CM | POA: Diagnosis not present

## 2016-10-12 DIAGNOSIS — I5022 Chronic systolic (congestive) heart failure: Secondary | ICD-10-CM | POA: Diagnosis not present

## 2016-10-12 DIAGNOSIS — G20 Parkinson's disease: Secondary | ICD-10-CM | POA: Diagnosis not present

## 2016-10-12 DIAGNOSIS — I110 Hypertensive heart disease with heart failure: Secondary | ICD-10-CM | POA: Diagnosis not present

## 2016-10-12 DIAGNOSIS — I872 Venous insufficiency (chronic) (peripheral): Secondary | ICD-10-CM | POA: Diagnosis not present

## 2016-10-12 DIAGNOSIS — M4722 Other spondylosis with radiculopathy, cervical region: Secondary | ICD-10-CM | POA: Diagnosis not present

## 2016-10-15 ENCOUNTER — Emergency Department (HOSPITAL_COMMUNITY): Payer: Medicare Other

## 2016-10-15 ENCOUNTER — Emergency Department (HOSPITAL_COMMUNITY)
Admission: EM | Admit: 2016-10-15 | Discharge: 2016-10-15 | Disposition: A | Discharge status: Home / Self Care | Payer: Medicare Other | Attending: Emergency Medicine | Admitting: Emergency Medicine

## 2016-10-15 ENCOUNTER — Encounter (HOSPITAL_COMMUNITY): Payer: Self-pay | Admitting: Emergency Medicine

## 2016-10-15 DIAGNOSIS — Y999 Unspecified external cause status: Secondary | ICD-10-CM | POA: Diagnosis not present

## 2016-10-15 DIAGNOSIS — W1839XA Other fall on same level, initial encounter: Secondary | ICD-10-CM | POA: Insufficient documentation

## 2016-10-15 DIAGNOSIS — Z7982 Long term (current) use of aspirin: Secondary | ICD-10-CM | POA: Diagnosis not present

## 2016-10-15 DIAGNOSIS — I872 Venous insufficiency (chronic) (peripheral): Secondary | ICD-10-CM | POA: Diagnosis not present

## 2016-10-15 DIAGNOSIS — E039 Hypothyroidism, unspecified: Secondary | ICD-10-CM | POA: Diagnosis not present

## 2016-10-15 DIAGNOSIS — I110 Hypertensive heart disease with heart failure: Secondary | ICD-10-CM | POA: Diagnosis not present

## 2016-10-15 DIAGNOSIS — M79671 Pain in right foot: Secondary | ICD-10-CM | POA: Insufficient documentation

## 2016-10-15 DIAGNOSIS — G20A1 Parkinson's disease without dyskinesia, without mention of fluctuations: Secondary | ICD-10-CM

## 2016-10-15 DIAGNOSIS — Y92009 Unspecified place in unspecified non-institutional (private) residence as the place of occurrence of the external cause: Secondary | ICD-10-CM | POA: Insufficient documentation

## 2016-10-15 DIAGNOSIS — N189 Chronic kidney disease, unspecified: Secondary | ICD-10-CM | POA: Diagnosis not present

## 2016-10-15 DIAGNOSIS — S098XXA Other specified injuries of head, initial encounter: Secondary | ICD-10-CM | POA: Diagnosis not present

## 2016-10-15 DIAGNOSIS — G8929 Other chronic pain: Secondary | ICD-10-CM | POA: Diagnosis not present

## 2016-10-15 DIAGNOSIS — G20 Parkinson's disease: Secondary | ICD-10-CM | POA: Diagnosis not present

## 2016-10-15 DIAGNOSIS — Y9301 Activity, walking, marching and hiking: Secondary | ICD-10-CM | POA: Insufficient documentation

## 2016-10-15 DIAGNOSIS — Z79899 Other long term (current) drug therapy: Secondary | ICD-10-CM | POA: Diagnosis not present

## 2016-10-15 DIAGNOSIS — W19XXXA Unspecified fall, initial encounter: Secondary | ICD-10-CM

## 2016-10-15 DIAGNOSIS — S99921A Unspecified injury of right foot, initial encounter: Secondary | ICD-10-CM | POA: Diagnosis not present

## 2016-10-15 DIAGNOSIS — I130 Hypertensive heart and chronic kidney disease with heart failure and stage 1 through stage 4 chronic kidney disease, or unspecified chronic kidney disease: Secondary | ICD-10-CM | POA: Diagnosis not present

## 2016-10-15 DIAGNOSIS — S0990XA Unspecified injury of head, initial encounter: Secondary | ICD-10-CM | POA: Diagnosis not present

## 2016-10-15 DIAGNOSIS — Z8673 Personal history of transient ischemic attack (TIA), and cerebral infarction without residual deficits: Secondary | ICD-10-CM | POA: Diagnosis not present

## 2016-10-15 DIAGNOSIS — S199XXA Unspecified injury of neck, initial encounter: Secondary | ICD-10-CM | POA: Diagnosis not present

## 2016-10-15 DIAGNOSIS — I5033 Acute on chronic diastolic (congestive) heart failure: Secondary | ICD-10-CM | POA: Diagnosis not present

## 2016-10-15 DIAGNOSIS — I5022 Chronic systolic (congestive) heart failure: Secondary | ICD-10-CM | POA: Diagnosis not present

## 2016-10-15 DIAGNOSIS — M4722 Other spondylosis with radiculopathy, cervical region: Secondary | ICD-10-CM | POA: Diagnosis not present

## 2016-10-15 DIAGNOSIS — G4489 Other headache syndrome: Secondary | ICD-10-CM | POA: Diagnosis not present

## 2016-10-15 LAB — BASIC METABOLIC PANEL
Anion gap: 7 (ref 5–15)
BUN: 15 mg/dL (ref 6–20)
CALCIUM: 9.1 mg/dL (ref 8.9–10.3)
CO2: 26 mmol/L (ref 22–32)
CREATININE: 0.97 mg/dL (ref 0.44–1.00)
Chloride: 106 mmol/L (ref 101–111)
GFR calc Af Amer: >60 mL/min (ref >60)
GFR, EST NON AFRICAN AMERICAN: 55 mL/min — AB (ref >60)
GLUCOSE: 92 mg/dL (ref 65–99)
Potassium: 4.1 mmol/L (ref 3.5–5.1)
SODIUM: 139 mmol/L (ref 135–145)

## 2016-10-15 LAB — CBC WITH DIFFERENTIAL/PLATELET
Basophils Absolute: 0 10*3/uL (ref 0.0–0.1)
Basophils Relative: 1 %
EOS ABS: 0.3 10*3/uL (ref 0.0–0.7)
EOS PCT: 4 %
HCT: 40 % (ref 36.0–46.0)
Hemoglobin: 12.7 g/dL (ref 12.0–15.0)
LYMPHS ABS: 1.2 10*3/uL (ref 0.7–4.0)
Lymphocytes Relative: 15 %
MCH: 27.9 pg (ref 26.0–34.0)
MCHC: 31.8 g/dL (ref 30.0–36.0)
MCV: 87.9 fL (ref 78.0–100.0)
MONO ABS: 0.6 10*3/uL (ref 0.1–1.0)
Monocytes Relative: 7 %
Neutro Abs: 6.2 10*3/uL (ref 1.7–7.7)
Neutrophils Relative %: 74 %
PLATELETS: 129 10*3/uL — AB (ref 150–400)
RBC: 4.55 MIL/uL (ref 3.87–5.11)
RDW: 16.5 % — AB (ref 11.5–15.5)
WBC: 8.4 10*3/uL (ref 4.0–10.5)

## 2016-10-15 LAB — SEDIMENTATION RATE: SED RATE: 8 mm/h (ref 0–22)

## 2016-10-15 MED ORDER — CARBIDOPA-LEVODOPA ER 25-100 MG PO TBCR
1.5000 | EXTENDED_RELEASE_TABLET | Freq: Once | ORAL | Status: AC
  Administered 2016-10-15: 1.5 via ORAL
  Filled 2016-10-15: qty 1.5

## 2016-10-15 NOTE — ED Notes (Signed)
Called Pt's Caregiver [name] with no response. Pt normally lives at home by herself

## 2016-10-15 NOTE — Discharge Planning (Signed)
Pt currently active with [name] for RN/NA/PT/OT/SW services.  Resumption of care requested. Christa See, RN of [facility] notified.  No DME needs identified at this time.   Pt also has [facility] CM and SW services.

## 2016-10-15 NOTE — Discharge Instructions (Signed)
Increase your furosemide (Lasix) to 80 mg (two tablets) a day for the next three days, then reduce it back to 40 mg (one tablet) a day.

## 2016-10-15 NOTE — ED Triage Notes (Signed)
Pt transported from after attempting to walk backwards with her walker losing balance and falling to the floor. Pt struck her head on small wooden stool. Denies LOC.  Per EMS pt called @ midnight for leg pain and redness but refused to be transported . Per EMS pt states she feels she is having hallucinations related to medication, pt has hx of calling 911 for reports of someone in her home and believing children are in her home.

## 2016-10-15 NOTE — Progress Notes (Signed)
CSW received consult for placement. CSW engaged with Patient at her bedside. CSW introduced self, role of CSW, and began discharge planning. CSW explained to Patient that Medicare requires a qualifying 3 day inpatient hospital stay for insurance coverage. CSW explained that because Patient is not being admitted, [name] placement would be private pay. Patient reports that she is unable to pay for a [name] facility out of pocket at this time. Patient reports that she has a private duty caregiver, Huntley Estelle, who comes to her home every day for 3 hours. She reports that she also receives mobile meals. Patient reports that she was previously receiving home health services with [name] that ended right before Christmas. Patient reports that she has 5 brothers but notes that they do not come by every day. Patient reports that she is able to mobilize after she has taken her Parkinson's medication. RN to reassess mobility after medication has had a chance to take effect. CSW has also staffed with RN Case Manager for additional home health/ in home services. CSW continues to follow.    Lorrine Kin, MSW, LCSW [facility] ED/2M Clinical Social Worker [phone]

## 2016-10-15 NOTE — ED Provider Notes (Signed)
[facility] DEPT Provider Note   CSN: [id number] Arrival date & time: 10/15/16  0502     History   Chief Complaint Chief Complaint  Patient presents with  . Fall    HPI [name] is a 76 y.o. female.  She was brought in by ambulance after having a fall at home. She is going to the bathroom, and while walking backward, apparently lost her balance and fell. She states she twisted her right foot and hit her head without loss of consciousness. Of note, she is also having some swelling in her legs and probably had called EMS earlier in the evening but had refused transport at that time. She is unable to put a number on her pain.   The history is provided by the patient.  Fall     Past Medical History:  Diagnosis Date  . Acute on chronic systolic heart failure ([place])   . Anemia   . Anxiety   . Arthritis   . Cataract left  . Cellulitis and abscess of leg, except foot   . Cellulitis of lower leg 12/24/2011  . CHF (congestive heart failure) ([place])   . Chronic bronchitis ([place])    "usually get it q yr"  . Chronic systolic heart failure ([place])   . GERD (gastroesophageal reflux disease)   . Glaucoma left  . Helicobacter pylori (H. pylori)   . History of bleeding peptic ulcer   . History of blood transfusion    "don't remember why"  . Hypercholesterolemia   . Hypertension   . Hypotension, unspecified   . Hypothyroidism   . Parkinson's disease   . Pneumonia   . Sleep apnea    "had test years ago; insurance wouldn't pay for mask so I never had one" (04/27/2015)  . Stroke [facility]) X 3   "that  was the reason I got Parkinson's"    Patient Active Problem List   Diagnosis Date Noted  . Anxiety state 06/18/2016  . 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 Syncope 06/10/2016  . Fall 06/10/2016  . Near syncope   . Altered mental state   . Chronic systolic congestive heart failure ([place])   . CHF exacerbation ([place]) 05/25/2016  . Pneumonia  05/25/2016  . Bilateral cellulitis of lower leg 03/11/2016  . Venous stasis dermatitis of both lower extremities 03/11/2016  . Melena 02/15/2016  . Right lower quadrant abdominal swelling, mass and lump 02/15/2016  . Acute delirium 04/27/2015  . Acute encephalopathy 04/27/2015  . UTI (urinary tract infection): Probable 04/27/2015  . Debility   . Dyskinesia 04/24/2015  . Cellulitis of left leg 04/23/2015  . Failure to thrive in adult 04/23/2015  . Chronic low back pain 01/18/2015  . Lower extremity pain 12/07/2014  . Cellulitis of left lower extremity   . Nausea vomiting and diarrhea 08/07/2014  . Enteritis 08/07/2014  . 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 Chronic constipation 02/11/2014  . Primary freezing of gait 02/11/2014  . Abnormality of gait 01/28/2014  . Physical deconditioning 10/05/2013  . Thrombocytopenia, unspecified 09/30/2013  . Rectal bleeding 09/29/2013  . UGI bleed 09/29/2013  . Anemia due to blood loss, acute 09/29/2013  . Anemia, iron deficiency 04/09/2013  . Cameron lesion, acute 01/08/2012  . Acute urinary retention, resolved after foley and no re-occurrence, 12/11/11  12/24/2011  . Cellulitis of lower leg 12/24/2011  . Diastolic dysfunction, grade 2 12/15/2011  . CAP (community acquired pneumonia)  12/15/2011  .  Abnormal EKG,(TWI lead 3 on one of 7 EKGs this admission) 12/12/2011  . Chest pain, MI R/O, negative MI- secondary to CAP 12/10/11 12/10/2011  . Cor pulmonale ([place]) 12/10/2011  . Syncope and collapse, pt fell and did not know why she fell. 12/10/2011  . Hematemesis 09/26/2011  . Acute blood loss anemia 09/26/2011  . Hypotension 09/26/2011  . Leucocytosis 09/26/2011  . Aspiration of blood 09/26/2011  . Parkinson disease ([place]) 09/26/2011  . Essential hypertension 09/26/2011  . GI bleed: January 2013, Hgb stable, stool negative 09/26/2011  . Gastric ulcer with hemorrhage 09/26/2011  . Esophagitis, erosive  09/26/2011    Past Surgical History:  Procedure Laterality Date  . BACK SURGERY    . ESOPHAGOGASTRODUODENOSCOPY  09/26/2011   Procedure: ESOPHAGOGASTRODUODENOSCOPY (EGD);  Surgeon: Zenovia Jarred, MD;  Location[facility];  Service: Gastroenterology;  Laterality: N/A;  To be done at bedside.  . ESOPHAGOGASTRODUODENOSCOPY N/A 09/30/2013   Procedure: ESOPHAGOGASTRODUODENOSCOPY (EGD);  Surgeon: [name], MD;  Location: [facility];  Service: Endoscopy;  Laterality: N/A;  . ESOPHAGOGASTRODUODENOSCOPY N/A 11/25/2013   Procedure: ESOPHAGOGASTRODUODENOSCOPY (EGD);  Surgeon: [name], MD;  Location: [facility];  Service: Endoscopy;  Laterality: N/A;  . HEMIARTHROPLASTY HIP Right 09/24/2007   Archie Endo 01/23/2011  . KYPHOPLASTY  06/2009   T12/notes 07/17/2009  . NISSEN FUNDOPLICATION     "had OR for GERD"  . TENDON REPAIR     Gluteus medius Archie Endo 01/23/2011  . THYROIDECTOMY    . TONSILLECTOMY      OB History    No data available       Home Medications    Prior to Admission medications   Medication Sig Start Date End Date Taking? Authorizing Provider  aspirin EC 81 MG tablet Take 81 mg by mouth daily.    Historical Provider, MD  carbidopa-levodopa (SINEMET IR) 25-100 MG tablet Take 1 at 6a, 1 1/2 pills at [place], 1 at 1p, 1 1/2 pills at 3p, 1 at 6pm, 1 1/2 pills at 9pm 08/13/16   Dennie Bible, NP  docusate sodium (COLACE) 100 MG capsule Take 100 mg by mouth daily. Reported on 02/28/2016    Historical Provider, MD  ferrous sulfate 325 (65 FE) MG EC tablet Take 1 tablet (325 mg total) by mouth daily. 02/22/16   [name] L Reed, DO  furosemide (LASIX) 40 MG tablet Take 1 tablet (40 mg total) by mouth daily. 06/12/16   Domenic Polite, MD  latanoprost (XALATAN) 0.005 % ophthalmic solution Place 1 drop into the left eye daily at 6 PM.  02/23/15   Historical Provider, MD  LORazepam (ATIVAN) 0.5 MG tablet Take 1 tablet (0.5 mg total) by mouth 2 (two) times daily as needed for anxiety. 06/12/16    Domenic Polite, MD  metoprolol succinate (TOPROL-XL) 50 MG 24 hr tablet Take 1 tablet (50 mg total) by mouth daily. 09/11/16   [name] L Reed, DO  Multiple Vitamins-Minerals (MULTIVITAMIN WITH MINERALS) tablet Take 1 tablet by mouth daily.    Historical Provider, MD  nystatin cream (MYCOSTATIN) Apply 1 application topically 2 (two) times daily.    Historical Provider, MD  Polyethyl Glycol-Propyl Glycol (SYSTANE OP) Place 1 drop into both eyes daily.     Historical Provider, MD  pramipexole (MIRAPEX) 1 MG tablet Take 1 tablet (1 mg total) by mouth 3 (three) times daily. 10/10/15   Star Age, MD  triamcinolone cream (KENALOG) 0.1 % Apply 1 application topically 2 (two) times daily.    Historical Provider, MD  Family History Family History  Problem Relation Age of Onset  . GER disease Mother   . Heart disease Father     Social History Social History  Substance Use Topics  . Smoking status: Never Smoker  . Smokeless tobacco: Never Used  . Alcohol use No     Allergies   Codeine and Penicillins   Review of Systems Review of Systems  All other systems reviewed and are negative.    Physical Exam Updated Vital Signs BP (!) 125/52 (BP Location: Right Arm)   Pulse 118   Temp 97.3 F (36.3 C) (Oral)   Resp 21   SpO2 100%   Physical Exam  Nursing note and vitals reviewed.  76 year old female, resting comfortably and in no acute distress. Vital signs are Significant for tachycardia. Oxygen saturation is 100%, which is normal. Head is normocephalic and atraumatic. PERRLA, EOMI. Oropharynx is clear. Neck is nontender without adenopathy or JVD. Back is nontender and there is no CVA tenderness. Lungs are clear without rales, wheezes, or rhonchi. Chest is nontender. Heart has regular rate and rhythm without murmur. Abdomen is soft, flat, nontender without masses or hepatosplenomegaly and peristalsis is normoactive. Extremities: Mild to moderate erythema of both lower legs with  2+ brawny edema. Skin is warm and dry without rash. Neurologic: Mental status is normal, cranial nerves are intact, there are no motor or sensory deficits. Dystonic movements are present consistent with known history of Parkinson's disease.  ED Treatments / Results  Labs (all labs ordered are listed, but only abnormal results are displayed) Labs Reviewed  CBC WITH DIFFERENTIAL/PLATELET - Abnormal; Notable for the following:       Result Value   RDW 16.5 (*)    Platelets 129 (*)    All other components within normal limits  BASIC METABOLIC PANEL - Abnormal; Notable for the following:    GFR calc non Af Amer 55 (*)    All other components within normal limits  SEDIMENTATION RATE    Radiology Ct Head Wo Contrast  Result Date: 10/15/2016 CLINICAL DATA:  76 year old hypertensive female with Parkinson's disease post fall striking head. Initial encounter. EXAM: CT HEAD WITHOUT CONTRAST CT CERVICAL SPINE WITHOUT CONTRAST TECHNIQUE: Multidetector CT imaging of the head and cervical spine was performed following the standard protocol without intravenous contrast. Multiplanar CT image reconstructions of the cervical spine were also generated. COMPARISON:  09/19/2016 CT. FINDINGS: CT HEAD FINDINGS Exam is motion degraded. Brain: No intracranial hemorrhage or CT evidence of large acute infarct. Remote left frontal lobe infarct. Global atrophy without hydrocephalus. Prominent pineal gland/ calcifications stable. Otherwise no intracranial mass lesion noted on this unenhanced exam. Vascular: Vascular calcifications. Skull: No skull fracture. Sinuses/Orbits: No acute orbital abnormality. Minimal mucosal thickening ethmoid sinus air cells. Other: Nonspecific subcutaneous calcified lesions unchanged. CT CERVICAL SPINE FINDINGS Exam is motion degraded. Alignment: Unchanged from prior exam. Skull base and vertebrae: No cervical spine fracture noted. Soft tissues and spinal canal: No abnormal prevertebral soft  tissues wound. 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 If ligamentous injury or cord injury were of high clinical concern, MR imaging could be obtained for further delineation. Upper chest: Scarring stable. Other: Prior right thyroid surgery. Enlarged left lobe with thyroid gland with substernal extension displacement surrounding structures similar to prior exam. Carotid bifurcation calcifications. IMPRESSION: Exam is motion degraded. No skull fracture or intracranial hemorrhage. No cervical spine fracture.  Remainder of findings similar to prior exams as noted above. Electronically Signed   By: Genia Del M.D.   On: 10/15/2016 06:35   Ct Cervical Spine Wo Contrast  Result Date: 10/15/2016 CLINICAL DATA:  76 year old hypertensive female with Parkinson's disease post fall striking head. Initial encounter. EXAM: CT HEAD WITHOUT CONTRAST CT CERVICAL SPINE WITHOUT CONTRAST TECHNIQUE: Multidetector CT imaging of the head and cervical spine was performed following the standard protocol without intravenous contrast. Multiplanar CT image reconstructions of the cervical spine were also generated. COMPARISON:  09/19/2016 CT. FINDINGS: CT HEAD FINDINGS Exam is motion degraded. Brain: No intracranial hemorrhage or CT evidence of large acute infarct. Remote left frontal lobe infarct. Global atrophy without hydrocephalus. Prominent pineal gland/ calcifications stable. Otherwise no intracranial mass lesion noted on this unenhanced exam. Vascular: Vascular calcifications. Skull: No skull fracture. Sinuses/Orbits: No acute orbital abnormality. Minimal mucosal thickening ethmoid sinus air cells. Other: Nonspecific subcutaneous calcified lesions unchanged. CT CERVICAL SPINE FINDINGS Exam is motion degraded. Alignment: Unchanged from prior exam. Skull base and vertebrae: No cervical spine fracture  noted. Soft tissues and spinal canal: No abnormal prevertebral soft tissues wound. 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 If ligamentous injury or cord injury were of high clinical concern, MR imaging could be obtained for further delineation. Upper chest: Scarring stable. Other: Prior right thyroid surgery. Enlarged left lobe with thyroid gland with substernal extension displacement surrounding structures similar to prior exam. Carotid bifurcation calcifications. IMPRESSION: Exam is motion degraded. No skull fracture or intracranial hemorrhage. No cervical spine fracture. Remainder of findings similar to prior exams as noted above. Electronically Signed   By: Genia Del M.D.   On: 10/15/2016 06:35   Dg Foot Complete Right  Result Date: 10/15/2016 CLINICAL DATA:  76 year old female with fall and right foot pain. EXAM: RIGHT FOOT COMPLETE - 3+ VIEW COMPARISON:  None. FINDINGS: There is no acute fracture or dislocation. The bones are osteopenic. There is degenerative changes of the first MTP joint with osteophyte formation. There is mild hallucis valgus deformity. The soft tissues appear unremarkable. IMPRESSION: No acute fracture or dislocation. Osteopenia. Electronically Signed   By: Anner Crete M.D.   On: 10/15/2016 06:17    Procedures Procedures (including critical care time)  Medications Ordered in ED Medications - No data to display   Initial Impression / Assessment and Plan / ED Course  I have reviewed the triage vital signs and the nursing notes.  Pertinent labs & imaging results that were available during my care of the patient were reviewed by me and considered in my medical decision making (see chart for details).  Fall without apparent injury. She'll be sent for x-rays of her foot as well as CT of the head and cervical spine. Leg swelling appears to be  primarily edema. Doubt cellulitis. We'll check CBC and sedimentation rate. Old records are reviewed, and she has a prior ED visit for fall.  Laboratory workup is unremarkable. X-ray show no acute injury. She is discharged home. Review of old records does show patchy has known chronic venous stasis dermatitis. She may benefit from some increased diuresis. I have instructed her to increase her furosemide to 80 mg a day for 3 days. Follow-up with PCP.  Final Clinical Impressions(s) / ED Diagnoses   Final diagnoses:  Fall at home, initial encounter  Parkinson disease ([place])  Venous stasis dermatitis of both lower extremities  New Prescriptions New Prescriptions   No medications on file     Delora Fuel, MD [id number]

## 2016-10-15 NOTE — ED Notes (Signed)
Ordered patient breakfast tray.

## 2016-10-15 NOTE — ED Notes (Signed)
Pt stated that she normally takes Parkinson's medication thirty minutes before she tries to walk and patient has been unable to take her medication at this time. MD Vanita Panda made aware. This RN attempted to see if patient could walk, and she was unable to get out of bed without assistance and could not walk at this time.

## 2016-10-16 ENCOUNTER — Other Ambulatory Visit: Payer: Self-pay | Admitting: Licensed Clinical Social Worker

## 2016-10-16 NOTE — Patient Outreach (Signed)
[facility]) Care Management  10/16/2016  [name] 1940/10/09 [id number]   Assessment- CSW completed outreach call to patient. Patient went to the ED on 10/15/16 for attempting to walk backwards with her walker and having a fall. Per chart, patient has history of hallucinations related to medication and has a history of calling 911 and reporting someone or children are in her home. CSW was unable to reach patient successfully as phone kept ringing. CSW unable to leave a voice message. [name] will be active again with patient- RN/CNA/PT/OT/SW.  Plan-CSW will make additional outreach within one-two weeks. CSW will continue to provide social work services.   [name], BSW, MSW, [email or web address] Phone: [phone] Fax: [phone]

## 2016-10-17 ENCOUNTER — Other Ambulatory Visit: Payer: Self-pay | Admitting: *Deleted

## 2016-10-17 ENCOUNTER — Encounter: Payer: Self-pay | Admitting: *Deleted

## 2016-10-17 NOTE — Patient Outreach (Signed)
[facility]) Care Management Pump Back Telephone Outreach 10/17/2016  RACHYL EDSALL 02/26/1941 [id number]  Successful telephone outreach to [name], 76 y.o. female well known to [facility] CMfor multiple hospitalizations and safety concerns. [name] most recently admitted to the hospital September 18-20, 2017,after experiencing syncopal episode/ AMS at home. Patient was discharged to [name] for rehabiliation, and was subsequently discharged home from [facility] on July 05, 2016 with home health [facility]) services.[facility] CSW and [facility] pharmacyisalso actively involvedin Zeya's care for community resource/ medication adherenceevaluation. [name] with patient during phone call today.  Call was placed today to follow up with [facility] about a fall/ ED visit on Tuesday 10/15/16.  Today, Melica reports that she "is doing better," and she denies pain/ soreness following her fall, although she reports she has "a bruise" on her "backside" which she can not see; states that her caregiver [name] saw the bruise.  -- confirms that she will attend PCP office scheduled tomorrow; caregiver [name] will take her to PCP appointment.  -- reports that [facility] services through [name] came on Tuesday following her fall/ ED visit.  Leal is "not sure" if the [facility] staff was a nurse or PT/ OT.  -- reports taking medications as prescribed; states that [place], or her brother [name] call her every evening to make sure that she has taken her medications.  Thuyvi states that she recently visited her neurologist, and no changes were made in her medications.  Axel voices concern about Mirapex causing her hallucinations to be increased.  Anchal was encouraged to share this concern with her PCP during tomorrow's office visit  -- confirmed [place] CM routine home visit scheduled for next week; Fall risk/ prevention was reinforced during phone call today.  Teneisha denies further issues, concerns,  or problems today.  I confirmed that patient has my direct phone number, the main [facility] CM office phone number, and the [facility] CM 24-hour nurse advice phone number should issues arise prior to scheduled [place] home visit next week.  Plan:  [name] take her medications as prescribed and will attend all scheduled provider appointments.  Chrsitina will continueusing pill boxesfor all of her prescription medications.  [name] continue actively working [employer], [facility] RN CM, and[facility] CSW and will consider the use of blister packaging for her medicatons.  [name] notify her providers for any concerns, issues, problems, or questions that arise.  Continued [facility] CM outreach for self-health management of chronic disease state of CHF, fall risk, and medication management issues to continue with routine home visit scheduled next week.  [name], RN, BSN, [employer] [facility] Care Management  [phone]

## 2016-10-18 ENCOUNTER — Encounter: Payer: Self-pay | Admitting: Internal Medicine

## 2016-10-18 ENCOUNTER — Ambulatory Visit (INDEPENDENT_AMBULATORY_CARE_PROVIDER_SITE_OTHER): Payer: Medicare Other | Admitting: Internal Medicine

## 2016-10-18 VITALS — BP 160/80 | HR 79 | Ht 64.0 in | Wt 177.0 lb

## 2016-10-18 DIAGNOSIS — G20 Parkinson's disease: Secondary | ICD-10-CM

## 2016-10-18 DIAGNOSIS — I872 Venous insufficiency (chronic) (peripheral): Secondary | ICD-10-CM

## 2016-10-18 DIAGNOSIS — G20A1 Parkinson's disease without dyskinesia, without mention of fluctuations: Secondary | ICD-10-CM

## 2016-10-18 DIAGNOSIS — I5022 Chronic systolic (congestive) heart failure: Secondary | ICD-10-CM | POA: Diagnosis not present

## 2016-10-18 DIAGNOSIS — W19XXXA Unspecified fall, initial encounter: Secondary | ICD-10-CM | POA: Diagnosis not present

## 2016-10-18 NOTE — Progress Notes (Signed)
Location:  [facility] clinic Provider:  Dreydon Cardenas L. Mariea Clonts, D.O., C.M.D.  Code Status: DNR Goals of Care:  Advanced Directives 09/19/2016  Does Patient Have a Medical Advance Directive? Yes  Type of Paramedic of [place];Living will  Does patient want to make changes to medical advance directive? -  Copy of [place] in Chart? No - copy requested  Would patient like information on creating a medical advance directive? -  Pre-existing out of facility DNR order (yellow form or pink MOST form) -   Chief Complaint  Patient presents with  . Follow-up    ED follow-up from fall    HPI: Patient is a 76 y.o. female seen today for f/u after ED visit 10/15/16 for fall at home.  She was going to the bathroom and stopped in the kitchen for a snack and lost her balance falling to the floor.  She reported twisting her right foot and hitting her head.  Foot xray showed no acute fx, osteopenia.  CT head/neck showed no hemorrhage or cspine fx.  Cr was slightly up from prior upon my review (she had not had anything to eat or drink b/c she had the fall in the middle of the night).  Other labs were unremarkable.   Has two large bruises on the one buttock and a small one on her lower back.   [place] aide an hour a day twice a week Other caregiver is with her 3 hours per day during the week. Has some delusions as she is calling them at night.  She has a few times thought someone was remodeling her house in the middle of the night, people are in the house Has new prescription insurance and going to [facility] [address] now   Past Medical History:  Diagnosis Date  . Acute on chronic systolic heart failure ([place])   . Anemia   . Anxiety   . Arthritis   . Cataract left  . Cellulitis and abscess of leg, except foot   . Cellulitis of lower leg 12/24/2011  . CHF (congestive heart failure) ([place])   . Chronic bronchitis ([place])    "usually get it q yr"  . Chronic  systolic heart failure ([place])   . GERD (gastroesophageal reflux disease)   . Glaucoma left  . Helicobacter pylori (H. pylori)   . History of bleeding peptic ulcer   . History of blood transfusion    "don't remember why"  . Hypercholesterolemia   . Hypertension   . Hypotension, unspecified   . Hypothyroidism   . Parkinson's disease   . Pneumonia   . Sleep apnea    "had test years ago; insurance wouldn't pay for mask so I never had one" (04/27/2015)  . Stroke [facility]) X 3   "that  was the reason I got Parkinson's"    Past Surgical History:  Procedure Laterality Date  . BACK SURGERY    . ESOPHAGOGASTRODUODENOSCOPY  09/26/2011   Procedure: ESOPHAGOGASTRODUODENOSCOPY (EGD);  Surgeon: Zenovia Jarred, MD;  [facility]COPY;  Service: Gastroenterology;  Laterality: N/A;  To be done at bedside.  . ESOPHAGOGASTRODUODENOSCOPY N/A 09/30/2013   Procedure: ESOPHAGOGASTRODUODENOSCOPY (EGD);  Surgeon: [name], MD;  Location: [facility];  Service: Endoscopy;  Laterality: N/A;  . ESOPHAGOGASTRODUODENOSCOPY N/A 11/25/2013   Procedure: ESOPHAGOGASTRODUODENOSCOPY (EGD);  Surgeon: [name], MD;  Location: [facility] ENDOSCOPY;  Service: Endoscopy;  Laterality: N/A;  . HEMIARTHROPLASTY HIP Right 09/24/2007   Archie Endo 01/23/2011  .  KYPHOPLASTY  06/2009   T12/notes 07/17/2009  . NISSEN FUNDOPLICATION     "had OR for GERD"  . TENDON REPAIR     Gluteus medius [facility]o 01/23/2011  . THYROIDECTOMY    . TONSILLECTOMY      Allergies  Allergen Reactions  . Codeine Other (See Comments)    Unknown allergic reaction  . Penicillins Other (See Comments)    Has patient had a PCN reaction causing immediate rash, facial/tongue/throat swelling, SOB or lightheadedness with hypotension: No Has patient had a PCN reaction causing severe rash involving mucus membranes or skin necrosis: No Has patient had a PCN reaction that required hospitalization No Has patient had a PCN reaction occurring within the last 10 years: No If  all of the above answers are "NO", then may proceed with Cephalosporin use. Pt passed out in doctor's office after penicillin dose    Allergies as of 10/18/2016      Reactions   Codeine Other (See Comments)   Unknown allergic reaction   Penicillins Other (See Comments)   Has patient had a PCN reaction causing immediate rash, facial/tongue/throat swelling, SOB or lightheadedness with hypotension: No Has patient had a PCN reaction causing severe rash involving mucus membranes or skin necrosis: No Has patient had a PCN reaction that required hospitalization No Has patient had a PCN reaction occurring within the last 10 years: No If all of the above answers are "NO", then may proceed with Cephalosporin use. Pt passed out in doctor's office after penicillin dose      Medication List       Accurate as of 10/18/16 10:08 AM. Always use your most recent med list.          aspirin EC 81 MG tablet Take 81 mg by mouth daily.   carbidopa-levodopa 25-100 MG tablet Commonly known as:  SINEMET IR Take 1 at 6a, 1 1/2 pills at 9a, 1 at 1p, 1 1/2 pills at 3p, 1 at 6pm, 1 1/2 pills at 9pm   docusate sodium 100 MG capsule Commonly known as:  COLACE Take 100 mg by mouth daily. Reported on 02/28/2016   ferrous sulfate 325 (65 FE) MG EC tablet Take 1 tablet (325 mg total) by mouth daily.   furosemide 40 MG tablet Commonly known as:  LASIX Take 1 tablet (40 mg total) by mouth daily.   latanoprost 0.005 % ophthalmic solution Commonly known as:  XALATAN Place 1 drop into the left eye daily at 6 PM.   LORazepam 0.5 MG tablet Commonly known as:  ATIVAN Take 1 tablet (0.5 mg total) by mouth 2 (two) times daily as needed for anxiety.   metoprolol succinate 50 MG 24 hr tablet Commonly known as:  TOPROL-XL Take 1 tablet (50 mg total) by mouth daily.   multivitamin with minerals tablet Take 1 tablet by mouth daily.   nystatin cream Commonly known as:  MYCOSTATIN Apply 1 application topically 2  (two) times daily.   pramipexole 1 MG tablet Commonly known as:  MIRAPEX Take 1 tablet (1 mg total) by mouth 3 (three) times daily.   SYSTANE OP Place 1 drop into both eyes daily.   triamcinolone cream 0.1 % Commonly known as:  KENALOG Apply 1 application topically 2 (two) times daily.       Review of Systems:  Review of Systems  Constitutional: Negative for chills, fever and malaise/fatigue.  HENT: Negative for congestion.        Dental work needed  Eyes: Negative for blurred  vision.  Respiratory: Positive for shortness of breath. Negative for cough and wheezing.   Cardiovascular: Negative for chest pain, palpitations and leg swelling.  Gastrointestinal: Negative for abdominal pain, blood in stool, constipation and melena.  Genitourinary: Negative for dysuria.  Musculoskeletal: Positive for back pain, falls and myalgias.  Skin: Negative for itching and rash.       Chronic erythema of lower extremities  Neurological: Negative for dizziness, loss of consciousness and weakness.  Endo/Heme/Allergies: Does not bruise/bleed easily.  Psychiatric/Behavioral: Positive for hallucinations and memory loss. Negative for depression.       Delusions    Health Maintenance  Topic Date Due  . DEXA SCAN  06/06/2005  . ZOSTAVAX  09/22/2017 (Originally 06/06/2000)  . TETANUS/TDAP  09/22/2017 (Originally 06/07/1960)  . INFLUENZA VACCINE  Completed  . PNA vac Low Risk Adult  Completed    Physical Exam: Vitals:   10/18/16 1002  BP: (!) 160/80  Pulse: 79  SpO2: 98%  Weight: 177 lb (80.3 kg)  Height: 5\' 4"  (1.626 m)   Body mass index is 30.38 kg/m. Physical Exam  Constitutional: She is oriented to person, place, and time. She appears well-developed and well-nourished. No distress.  Cardiovascular: Normal rate, regular rhythm, normal heart sounds and intact distal pulses.   Pulmonary/Chest: Effort normal and breath sounds normal. No respiratory distress. She has no rales.  Abdominal:  Soft. Bowel sounds are normal. She exhibits no distension. There is no tenderness.  Musculoskeletal: Normal range of motion.  Walks with rollator  Neurological: She is alert and oriented to person, place, and time. She exhibits abnormal muscle tone. Coordination abnormal.  Skin: Skin is warm and dry.  Chronic LE warmth and erythema  Psychiatric: She has a normal mood and affect.    Labs reviewed: Basic Metabolic Panel:  Recent Labs  05/25/16 1134  05/27/16 0316 05/29/16 0840 05/31/16 0325  06/12/16 0627 09/19/16 2212 10/15/16 0557  NA  --   < > 139 137 138  < > 141 143 139  K  --   < > 4.1 3.6 4.1  < > 3.6 4.2 4.1  CL  --   < > 102 100* 97*  < > 108 111 106  CO2  --   < > 32 25 29  < > 27 27 26   GLUCOSE  --   < > 93 158* 131*  < > 115* 104* 92  BUN  --   < > 23* 28* 38*  < > 18 17 15   CREATININE  --   < > 1.00 1.06* 1.15*  < > 0.83 0.80 0.97  CALCIUM  --   < > 9.1 8.8* 9.3  < > 8.8* 8.5* 9.1  MG  --   --  2.2 2.5* 2.6*  --   --   --   --   TSH 0.762  --   --   --   --   --   --   --   --   < > = values in this interval not displayed. Liver Function Tests:  Recent Labs  06/06/16 0935 06/10/16 0957  AST 16 22  ALT 7 13*  ALKPHOS 66 63  BILITOT 0.5 0.5  PROT 6.4 7.5  ALBUMIN 3.7 3.6   No results for input(s): LIPASE, AMYLASE in the last 8760 hours. No results for input(s): AMMONIA in the last 8760 hours. CBC:  Recent Labs  06/10/16 0957  06/12/16 0627 09/19/16 2212 10/15/16 0557  WBC 7.1  < >  10.0 8.7 8.4  NEUTROABS 4.9  --   --  5.9 6.2  HGB 11.8*  < > 10.3* 12.8 12.7  HCT 39.3  < > 34.2* 39.0 40.0  MCV 79.6  < > 79.2 85.3 87.9  PLT 190  < > 183 154 129*  < > = values in this interval not displayed. Lipid Panel: No results for input(s): CHOL, HDL, LDLCALC, TRIG, CHOLHDL, LDLDIRECT in the last 8760 hours. No results found for: HGBA1C  Procedures since last visit: Dg Lumbar Spine Complete  Result Date: 09/20/2016 CLINICAL DATA:  Patient fell with low  back pain. EXAM: LUMBAR SPINE - COMPLETE 4+ VIEW COMPARISON:  CT abdomen and pelvis from 02/23/2016 ,CXR from 06/10/2016 FINDINGS: Vertebral augmentation at T12 with chronic moderate compressions of T6, T8 and T11. Degenerative disc disease of the lumbar spine most notably at L2-3 and L3-4. No spondylolysis nor spondylolisthesis. Multilevel degenerative facet arthropathy of the lower lumbar facets more noticeably from L4 through S1. IMPRESSION: Lumbar spondylosis. Chronic compression fractures of T6, T8, T11 and with vertebral augmentation at T12. No acute osseous abnormality identified. Electronically Signed   By: Ashley Royalty M.D.   On: 09/20/2016 01:03   Ct Head Wo Contrast  Result Date: 10/15/2016 CLINICAL DATA:  76 year old hypertensive female with Parkinson's disease post fall striking head. Initial encounter. EXAM: CT HEAD WITHOUT CONTRAST CT CERVICAL SPINE WITHOUT CONTRAST TECHNIQUE: Multidetector CT imaging of the head and cervical spine was performed following the standard protocol without intravenous contrast. Multiplanar CT image reconstructions of the cervical spine were also generated. COMPARISON:  09/19/2016 CT. FINDINGS: CT HEAD FINDINGS Exam is motion degraded. Brain: No intracranial hemorrhage or CT evidence of large acute infarct. Remote left frontal lobe infarct. Global atrophy without hydrocephalus. Prominent pineal gland/ calcifications stable. Otherwise no intracranial mass lesion noted on this unenhanced exam. Vascular: Vascular calcifications. Skull: No skull fracture. Sinuses/Orbits: No acute orbital abnormality. Minimal mucosal thickening ethmoid sinus air cells. Other: Nonspecific subcutaneous calcified lesions unchanged. CT CERVICAL SPINE FINDINGS Exam is motion degraded. Alignment: Unchanged from prior exam. Skull base and vertebrae: No cervical spine fracture noted. Soft tissues and spinal canal: No abnormal prevertebral soft tissues wound. Mild transverse ligament stable Disc  levels: Cervical spondylotic changes most prominent C4-5 thru C6-7 with various degrees of spinal stenosis and foraminal narrowing. If ligamentous injury or cord injury were of high clinical concern, MR imaging could be obtained for further delineation. Upper chest: Scarring stable. Other: Prior right thyroid surgery. Enlarged left lobe with thyroid gland with substernal extension displacement surrounding structures similar to prior exam. Carotid bifurcation calcifications. IMPRESSION: Exam is motion degraded. No skull fracture or intracranial hemorrhage. No cervical spine fracture. Remainder of findings similar to prior exams as noted above. Electronically Signed   By: Genia Del M.D.   On: 10/15/2016 06:35   Ct Head Wo Contrast  Result Date: 09/19/2016 CLINICAL DATA:  Per EMS, patient from home, c/o lower back pain after fall today while trying to get to the door. Denies head injury and LOC. Hx Parkinson's and chronic cellulitis to lower extremities. AANDOx4. EXAM: CT HEAD WITHOUT CONTRAST CT CERVICAL SPINE WITHOUT CONTRAST TECHNIQUE: Multidetector CT imaging of the head and cervical spine was performed following the standard protocol without intravenous contrast. Multiplanar CT image reconstructions of the cervical spine were also generated. COMPARISON:  06/10/2016 FINDINGS: CT HEAD FINDINGS Brain: The ventricles are normal in size and configuration. There are no parenchymal masses or mass effect. An area of encephalomalacia along the  lateral left frontal lobe reflects an old infarct, stable. There is no evidence of an infarct. There are no extra-axial masses or abnormal fluid collections. There is no intracranial hemorrhage. Vascular: No hyperdense vessel or unexpected calcification. Skull: Normal. Negative for fracture or focal lesion. Sinuses/Orbits: Globes orbits are unremarkable. Visualized sinuses and mastoid air cells are clear. Other: There multiple stable partly calcified scalp lesions. CT  CERVICAL SPINE FINDINGS Alignment: Normal. Skull base and vertebrae: No acute fracture. No primary bone lesion or focal pathologic process. Soft tissues and spinal canal: No prevertebral fluid or swelling. No visible canal hematoma. Heterogeneous enlarged left thyroid lobe, with multiple nodules, mildly deviates the trachea to the right, stable from the prior study. Disc levels: Mild loss of disc height at C4-C5. Moderate loss disc height at C5-C6. Spondylotic disc bulging and endplate spurring noted at these levels. Mild neural foraminal narrowing at these levels. Facet degenerative changes noted bilaterally. Findings are stable. Upper chest: No acute abnormality. Other: None IMPRESSION: HEAD CT:  No acute intracranial abnormalities.  No skull fracture. CERVICAL CT:  No fracture or acute finding. Electronically Signed   By: Lajean Manes M.D.   On: 09/19/2016 19:06   Ct Cervical Spine Wo Contrast  Result Date: 10/15/2016 CLINICAL DATA:  76 year old hypertensive female with Parkinson's disease post fall striking head. Initial encounter. EXAM: CT HEAD WITHOUT CONTRAST CT CERVICAL SPINE WITHOUT CONTRAST TECHNIQUE: Multidetector CT imaging of the head and cervical spine was performed following the standard protocol without intravenous contrast. Multiplanar CT image reconstructions of the cervical spine were also generated. COMPARISON:  09/19/2016 CT. FINDINGS: CT HEAD FINDINGS Exam is motion degraded. Brain: No intracranial hemorrhage or CT evidence of large acute infarct. Remote left frontal lobe infarct. Global atrophy without hydrocephalus. Prominent pineal gland/ calcifications stable. Otherwise no intracranial mass lesion noted on this unenhanced exam. Vascular: Vascular calcifications. Skull: No skull fracture. Sinuses/Orbits: No acute orbital abnormality. Minimal mucosal thickening ethmoid sinus air cells. Other: Nonspecific subcutaneous calcified lesions unchanged. CT CERVICAL SPINE FINDINGS Exam is motion  degraded. Alignment: Unchanged from prior exam. Skull base and vertebrae: No cervical spine fracture noted. Soft tissues and spinal canal: No abnormal prevertebral soft tissues wound. 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 If ligamentous injury or cord injury were of high clinical concern, MR imaging could be obtained for further delineation. Upper chest: Scarring stable. Other: Prior right thyroid surgery. Enlarged left lobe with thyroid gland with substernal extension displacement surrounding structures similar to prior exam. Carotid bifurcation calcifications. IMPRESSION: Exam is motion degraded. No skull fracture or intracranial hemorrhage. No cervical spine fracture. Remainder of findings similar to prior exams as noted above. Electronically Signed   By: Genia Del M.D.   On: 10/15/2016 06:35   Ct Cervical Spine Wo Contrast  Result Date: 09/19/2016 CLINICAL DATA:  Per EMS, patient from home, c/o lower back pain after fall today while trying to get to the door. Denies head injury and LOC. Hx Parkinson's and chronic cellulitis to lower extremities. AANDOx4. EXAM: CT HEAD WITHOUT CONTRAST CT CERVICAL SPINE WITHOUT CONTRAST TECHNIQUE: Multidetector CT imaging of the head and cervical spine was performed following the standard protocol without intravenous contrast. Multiplanar CT image reconstructions of the cervical spine were also generated. COMPARISON:  06/10/2016 FINDINGS: CT HEAD FINDINGS Brain: The ventricles are normal in size and configuration. There are no parenchymal masses or mass effect. An area of encephalomalacia along  the lateral left frontal lobe reflects an old infarct, stable. There is no evidence of an infarct. There are no extra-axial masses or abnormal fluid collections. There is no intracranial hemorrhage. Vascular: No hyperdense vessel or unexpected  calcification. Skull: Normal. Negative for fracture or focal lesion. Sinuses/Orbits: Globes orbits are unremarkable. Visualized sinuses and mastoid air cells are clear. Other: There multiple stable partly calcified scalp lesions. CT CERVICAL SPINE FINDINGS Alignment: Normal. Skull base and vertebrae: No acute fracture. No primary bone lesion or focal pathologic process. Soft tissues and spinal canal: No prevertebral fluid or swelling. No visible canal hematoma. Heterogeneous enlarged left thyroid lobe, with multiple nodules, mildly deviates the trachea to the right, stable from the prior study. Disc levels: Mild loss of disc height at C4-C5. Moderate loss disc height at C5-C6. Spondylotic disc bulging and endplate spurring noted at these levels. Mild neural foraminal narrowing at these levels. Facet degenerative changes noted bilaterally. Findings are stable. Upper chest: No acute abnormality. Other: None IMPRESSION: HEAD CT:  No acute intracranial abnormalities.  No skull fracture. CERVICAL CT:  No fracture or acute finding. Electronically Signed   By: Lajean Manes M.D.   On: 09/19/2016 19:06   Dg Shoulder Left  Result Date: 09/19/2016 CLINICAL DATA:  Pt c/o bilateral hip pain (L>R) and left shoulder pain after fall today in home. No known left shoulder surgery. EXAM: LEFT SHOULDER - 2+ VIEW COMPARISON:  None. FINDINGS: No fracture or dislocation. Moderate AC joint osteoarthritis. Bones are diffusely demineralized. Soft tissues are unremarkable. IMPRESSION: No fracture, dislocation or acute finding. Electronically Signed   By: Lajean Manes M.D.   On: 09/19/2016 18:49   Dg Foot Complete Right  Result Date: 10/15/2016 CLINICAL DATA:  76 year old female with fall and right foot pain. EXAM: RIGHT FOOT COMPLETE - 3+ VIEW COMPARISON:  None. FINDINGS: There is no acute fracture or dislocation. The bones are osteopenic. There is degenerative changes of the first MTP joint with osteophyte formation. There is mild  hallucis valgus deformity. The soft tissues appear unremarkable. IMPRESSION: No acute fracture or dislocation. Osteopenia. Electronically Signed   By: Anner Crete M.D.   On: 10/15/2016 06:17   Dg Hips Bilat With Pelvis 3-4 Views  Result Date: 09/19/2016 CLINICAL DATA:  Pt c/o bilateral hip pain (L>R) and left shoulder pain after fall today in home. Hx of right hip replacement. EXAM: DG HIP (WITH OR WITHOUT PELVIS) 3-4V BILAT COMPARISON:  None. FINDINGS: No acute fracture. Right hip hemiarthroplasty is well-seated and aligned. Left hip joint, SI joints and symphysis pubis are normally spaced and aligned. Bones are diffusely demineralized.  No bone lesion. Soft tissues are unremarkable. IMPRESSION: 1. No fracture or dislocation. No evidence of loosening of the right hip prosthesis. Electronically Signed   By: Lajean Manes M.D.   On: 09/19/2016 18:48    Assessment/Plan 1. Parkinson's disease ([place]) -cont same regimen sinemet and mirapex per neurology  2. 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 Venous stasis dermatitis of both lower extremities -should wear her compression hose -elevate feet at rest  4. Chronic systolic congestive heart failure ([facility]) -cont lasix   5. Fall, initial encounter -without significant injury, CT and xrays negative, appeared a bit dehydrated on labs at that time -has bruising of back and buttocks only   Labs/tests ordered:  No orders of the defined types were placed  in this encounter.  Next appt:  11/01/2016   Tage Feggins L. Deitrich Steve, D.O. [facility] [address], [place] [address] Cell Phone (Mon-Fri 8am-5pm):  [phone] On Call:  [phone] & follow prompts after 5pm & weekends Office Phone:  [phone] Office Fax:  [phone]

## 2016-10-18 NOTE — Patient Instructions (Signed)
We will be in touch about the new medication (nuplazid) for your delusions you are having.

## 2016-10-21 ENCOUNTER — Other Ambulatory Visit: Payer: Self-pay | Admitting: Licensed Clinical Social Worker

## 2016-10-21 ENCOUNTER — Encounter: Payer: Self-pay | Admitting: *Deleted

## 2016-10-21 ENCOUNTER — Ambulatory Visit: Payer: Medicare Other | Admitting: Neurology

## 2016-10-21 ENCOUNTER — Telehealth: Payer: Self-pay | Admitting: *Deleted

## 2016-10-21 NOTE — Telephone Encounter (Signed)
Patient is calling requesting a letter to dismiss her from jury duty. Please Advise.

## 2016-10-21 NOTE — Patient Outreach (Signed)
[facility]) Care Management  10/21/2016  MEG BOURLIER August 05, 1941 [id number]   Assessment- CSW completed call to patient. Patient answered HIPPA verifications were provided. She reports that she is doing "a lot better" since her recent ED visit on 10/16/15. She denies being in any pain or discomfort at this time. She states that she attended PCP appointment last week. She reports that she continues to benefit from stable transportation from her caregiver, brother and [facility]. She reports that [facility] is involved again with her care. She reports that she is getting nursing, PT and OT. Patient is agreeable to home visit tomorrow.  Plan-CSW will complete home visit tomorrow afternoon.  [name], BSW, MSW, [email or web address] Phone: [phone] Fax: [phone]

## 2016-10-21 NOTE — Telephone Encounter (Signed)
letter mailed to patients home address with results.

## 2016-10-22 ENCOUNTER — Other Ambulatory Visit: Payer: Self-pay | Admitting: *Deleted

## 2016-10-22 ENCOUNTER — Other Ambulatory Visit: Payer: Self-pay | Admitting: Licensed Clinical Social Worker

## 2016-10-22 DIAGNOSIS — G20 Parkinson's disease: Secondary | ICD-10-CM

## 2016-10-22 MED ORDER — PRAMIPEXOLE DIHYDROCHLORIDE 1 MG PO TABS: 1.0000 mg | ORAL_TABLET | Freq: Three times a day (TID) | ORAL | 1 refills | Status: AC

## 2016-10-22 NOTE — Patient Outreach (Signed)
[name]) Care Management  10/22/2016  [name] 16-Jul-1941 [id number]   Assessment- CSW arrived to patient's residence to complete home visit. CSW knocked and wait at the door for over 15 minutes but patient did not come to the door. CSW completed outreach to patient but no one answered and CSW was unable to leave a voice message. CSW completed call to [place] and updated her. She reports that she will be completing home visit on 10/24/16.  Plan-CSW will complete next outreach within one week to follow up.  [name], BSW, MSW, [name] Phone: [phone] Fax: [phone]

## 2016-10-22 NOTE — Telephone Encounter (Signed)
Patient requested a refill on medication.

## 2016-10-23 ENCOUNTER — Encounter: Payer: Self-pay | Admitting: Podiatry

## 2016-10-23 ENCOUNTER — Ambulatory Visit (INDEPENDENT_AMBULATORY_CARE_PROVIDER_SITE_OTHER): Payer: Medicare Other | Admitting: Podiatry

## 2016-10-23 VITALS — BP 128/79 | HR 63 | Resp 18

## 2016-10-23 DIAGNOSIS — M79674 Pain in right toe(s): Secondary | ICD-10-CM | POA: Diagnosis not present

## 2016-10-23 DIAGNOSIS — M79675 Pain in left toe(s): Secondary | ICD-10-CM

## 2016-10-23 DIAGNOSIS — B351 Tinea unguium: Secondary | ICD-10-CM | POA: Diagnosis not present

## 2016-10-23 NOTE — Progress Notes (Signed)
Patient ID: [name], female   DOB: 09/19/1941, 76 y.o.   MRN: [id number]   Subjective: This patient presents complaining of uncomfortable toenails when wearing shoes and requests toenail debridement. Patient has Parkinson's disease and is able to transfer from wheelchair to treatment chair Patient's caregivers present to treatment room today  Objective:  Orientated 3  Vascular: DP and PT pulses 2/4 bilaterally Capillary reflex immediate bilaterally No calf edema or calf tenderness bilaterally  Neurological: Sensation to 10 g monofilament wire intact 4/5 bilaterally Vibratory sensation nonreactive bilaterally Ankle reflex equal reactive bilaterally  Dermatological Erythema pretibial lower legs bilaterally no warmth or active drainage noted in the lower legs bilaterally No active drainage noted from erythematous areas lower legs The toenails are elongated, deformed, discolored and tender to direct palpation 6-10  Musculoskeletal: Patient is able to transfer from wheelchair to treatment chair HAV bilaterally Dorsi flexion, plantar flexion 5/5 bilaterally  Assessment: 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

## 2016-10-24 ENCOUNTER — Encounter: Payer: Self-pay | Admitting: *Deleted

## 2016-10-24 ENCOUNTER — Other Ambulatory Visit: Payer: Self-pay | Admitting: Licensed Clinical Social Worker

## 2016-10-24 ENCOUNTER — Other Ambulatory Visit: Payer: Self-pay | Admitting: *Deleted

## 2016-10-24 NOTE — Patient Outreach (Signed)
[place] [facility]) Care Management  [place] Routine Home Visit  10/24/2016  [name] 04-18-1941 [id number]  [name] is an 76 y.o. female well known to [place] CMfor multiple hospitalizations and safety concerns. [name] most recently admitted to the hospital September 18-20, 2017,after experiencing syncopal episode/ AMS at home. Patient was discharged to [name] for rehabiliation, and was subsequently discharged home from [facility] on July 05, 2016 with home health [facility]) services.THN CSW isalso actively [employer] care for community resource needs/ assistance. Patient and her personal care assistant, [name], are present today for home visit. HIPAA verified with patient in person today.  Today, patient states that she is doing "fine," and denies pain, stating she is "still just a little sore" after her most recent fall.  -- Patient has had 2 recent recent ED visits after falling in her home:  September 19, 2016 and October 15, 2016.  No serious injury was incurred during either fall.  Patient reports that she has continued having "visions' and hallucinations that are not real, and she contributes these partly as a reason she falls frequently.  [name] and caregiver [name] confirm that patient's PCP and neurology providers are aware of patient's "visions."  Patient continues to deny LOC, denies dizziness with fall.  Fall risks/ prevention/ education was again reiterated with patient.  Patient acknowledges that she sometimes "gets in a hurry" and needs to "slow down."  Patient remains a high fall risk due to hallucinations from Parkinson's, inability to ambulate without use of walker due to abnormal muscle tone/ coordination and tremors.  -- Patient confirms that she attended PCP visit 10/18/16; confirms that she has and has been taking [name] dosage of metoprolol once daily.  Today's medications are easily located in patient's home in centralized medication bin  created during previous [place] in-home visit.  Pill box visualized and appears to be filled accurately/ appropriately.  Medication reconciliation performed during today's in-home visit.  Patient reports compliance with medications today, and states that her personal care assistant [name] and brother [name] assist with filling of her pill box for all prescription medications except Sinemet, Mirapex, and Lasix, which patient reports independently managing, using her smaller pill box that she carries with her at all times in her walker pouch, which is attached to her walker.  [facility] pharmacy was involved in patient care previously to explore optoion of blister packaging for patient's medications, and today, [name] states that she has decided against using blister packs for her medications.  -- [name] has been completed per report of patient.  [name] reports today that she has additional assistance "through [facility]" for bathing aide, stating that she "has been on the waiting list for over a year."  [name] stated that she now has both this [name] aide as well as [name], and she appears pleased about this.  -- Self-health management of chronic disease state of CHF:  [name] is not wearing compression stockings today, stating that she has not had time yet this morning to put them on; it continues to appears that she has minimal, if any bilateral LE edema; minimal bilateral pedal noted, DP are easily palpated today.  [name] continues to be unable to weigh at home due to her chronic disease state of Parkinson's disease, as she is unable to stand without assistance to obtain an accurate weight.  [name] reports no breathing concerns/ issues today, and she appears to be at her baseline  with shortness of breath with activity.  CHF zones/ action plans were reinforced with [name] today during home visit.  [name] appears at her overall baseline from previous [place] in-home visits,  and denies concerns, questions, issues or problems today. [place] in-home visit scheduled today for next month.  Subjective: "I am hanging in there.... I just need to stop falling.... And I wish I could."  Objective:    BP 108/64   Pulse 68   Resp 18   SpO2 94%    Review of Systems  Constitutional: Negative.   Respiratory: Positive for shortness of breath. Negative for cough.        Baseline shortness of breath from previous [facility] home visits  Cardiovascular: Negative for chest pain and leg swelling.       Minimal bilateral pedal edema; no swelling noted in ankles this morning  Gastrointestinal: Negative.  Negative for abdominal pain, nausea and vomiting.  Genitourinary: Positive for frequency and urgency.       Diuretic therapy  Musculoskeletal: Positive for falls.       Recent fall 10/15/16; none since then  Neurological: Positive for tremors.       Parkinson's Disease  Psychiatric/Behavioral: Positive for hallucinations. Negative for depression. The patient is not nervous/anxious.        Hallucinations and delusions/ Parkinson's     Physical Exam  Constitutional: She is oriented to person, place, and time. She appears well-developed and well-nourished. No distress.  Cardiovascular: Normal rate, regular rhythm, normal heart sounds and intact distal pulses.   Respiratory: Breath sounds normal. No respiratory distress. She has no wheezes. She has no rales.  Baseline shortness of breath  GI: Soft. Bowel sounds are normal.  Musculoskeletal: She exhibits edema.  Minimal bilateral pedal edema at +1-- improved from previous [facility] CM home visits  Neurological: She is alert and oriented to person, place, and time. Coordination abnormal.  tremors  Skin: Skin is warm and dry. There is erythema.  Bilateral LE erythema/ chronic venous stasis dermatitis  Psychiatric: She has a normal mood and affect. Her behavior is normal. Judgment and thought content normal.    Assessment:   Safety issues with [name]'s medicationsthat were identified during [place] in-home visit continue to be stable today, since patient's metoprolol has been switched to a once-daily dosage. [name] appears to have a good general understanding of the purpose, dosing, and scheduling of her medications today, is keeping her medications in one centralized location in her home, and reports compliance with medications, with the assistance of her caregiver [name] and brother 40.[name] remains a high fall risk, and has experienced another fall.  [name] has a good support system through her brother and her personal care assistant.  [name] is committed to keeping her hospital follow up provider appointments, and agrees to contact her providers promptly for any [name] concerns, issues, or problems that arise.  Plan:   [name] take her medications as prescribed and will attend all scheduled provider appointments.  [name] will continueusing pill boxesfor all of [name]'s prescription medications.  [name] continue actively working withTHN RN CM, andTHN CSW.  [name] notify her providers for any concerns, issues, problems, or questions that arise.  I will send PCP notes from today's [facility] CM home visit as quarterly update on patient progress through secure messaging via EMR.  Continued [facility] Community CM outreach for self-health management of chronic disease state of CHF, fall risk, and medication management issues to continue with routine home visit  scheduled next month.  [facility] CM Care Plan Problem Two   Flowsheet Row Most Recent Value  Care Plan Problem Two  Medication safety/ adherence issues  Role Documenting the Problem Two  Care Management Coordinator  Care Plan for Problem Two  Not Active  Interventions for Problem Two Long Term Goal   Again discussed keeping medications in centralized area in her home Joint visit with [facility] Pharmacist today  [name] Term Goal (31-90) days  Over the next  31 days, patient will keep all of her medications in one centralized area in her home and will use pill boxes for all prescribed medications, as evidenced by patient and brother reporting during [place] outreach  [place] Term Goal Start Date  08/21/16  THN Long Term Goal Met Date  09/25/16  THN CM Short Term Goal #1 (0-30 days)  Over the next 30 days, patient will discuss medication management options with [facility] Pharmacist, as evidenced by patient reporting during [place] outreach, and [place] collaboration  [facility] CM Short Term Goal #1 Start Date  08/06/16  [facility] CM Short Term Goal #1 Met Date   09/09/16  Interventions for Short Term Goal #2   Utilizing teachback method, reinforced management options available to patient, and encouraged patient to work with [facility] pharmacist going forward  THN CM Short Term Goal #2 (0-30 days)  Over the next 30 days, patient will consider option for blister packaging of her medications, as evidenced by patient reporting during [place] outreach  [facility] CM Short Term Goal #2 Start Date  09/25/16  [facility] CM Short Term Goal #2 Met Date  10/24/16  Interventions for Short Term Goal #2  Utilizing teachback method, discussed with patient blister packaging options, along with [facility] Pharmacist [name]    [facility] CM Care Plan Problem Three   Flowsheet Row Most Recent Value  Care Plan Problem Three  High Fall risk related to history of falls and multiple falls thorughout the year  Role Documenting the Problem Three  Care Management [place] for Problem Three  Active  THN CM Short Term Goal #1 (0-30 days)  Over the next 30 days, patient will discuss fall prevention measures, as evidenced by patient reporting during [place] outreach  [facility] CM Short Term Goal #1 Start Date  09/25/16  [facility] CM Short Term Goal #1 Met Date  10/24/16  Interventions for Short Term Goal #1  Utilizing teachback method, reinforced fall prevention/ provided basic fall risk education  to patient  THN CM Short Term Goal #2 (0-30 days)  Patient will not experience another fall within the next 30 days, as evidenced by patient reporting during [facility] RN CM outreach and review of EMR  THN CM Short Term Goal #2 Start Date  10/24/16  Interventions for Short Term Goal #2  Utilizing teachback method, discussed fall risks and prevention measures with patient, need to contact providers promptly for any [name] concerns, problems, or issues      [name], RN, BSN, [employer] Coordinator [facility] Care Management  [phone]

## 2016-10-24 NOTE — Patient Outreach (Addendum)
[place] [facility]) Care Management  [facility] Social Work  10/24/2016  [name] 10-31-1940 [id number]  Encounter Medications:  Outpatient Encounter Prescriptions as of 10/24/2016  Medication Sig  . aspirin EC 81 MG tablet Take 81 mg by mouth daily.  . carbidopa-levodopa (SINEMET IR) 25-100 MG tablet Take 1 at 6a, 1 1/2 pills at 9a, 1 at 1p, 1 1/2 pills at 3p, 1 at 6pm, 1 1/2 pills at 9pm  . docusate sodium (COLACE) 100 MG capsule Take 100 mg by mouth daily. Reported on 02/28/2016  . ferrous sulfate 325 (65 FE) MG EC tablet Take 1 tablet (325 mg total) by mouth daily.  . furosemide (LASIX) 40 MG tablet Take 1 tablet (40 mg total) by mouth daily.  Marland Kitchen latanoprost (XALATAN) 0.005 % ophthalmic solution Place 1 drop into the left eye daily at 6 PM.   . LORazepam (ATIVAN) 0.5 MG tablet Take 1 tablet (0.5 mg total) by mouth 2 (two) times daily as needed for anxiety.  . metoprolol succinate (TOPROL-XL) 50 MG 24 hr tablet Take 1 tablet (50 mg total) by mouth daily.  . Multiple Vitamins-Minerals (MULTIVITAMIN WITH MINERALS) tablet Take 1 tablet by mouth daily.  Marland Kitchen nystatin cream (MYCOSTATIN) Apply 1 application topically 2 (two) times daily.  Vladimir Faster Glycol-Propyl Glycol (SYSTANE OP) Place 1 drop into both eyes daily.   . pramipexole (MIRAPEX) 1 MG tablet Take 1 tablet (1 mg total) by mouth 3 (three) times daily.  Marland Kitchen triamcinolone cream (KENALOG) 0.1 % Apply 1 application topically 2 (two) times daily.   No facility-administered encounter medications on file as of 10/24/2016.     Functional Status:  In your present state of health, do you have any difficulty performing the following activities: 10/15/2016 08/23/2016  Hearing? - N  Vision? - Y  Difficulty concentrating or making decisions? - Y  Walking or climbing stairs? - Y  Dressing or bathing? - Y  Doing errands, shopping? N Y  Conservation officer, nature and eating ? - Y  Using the Toilet? - N  In the past six months, have you accidently leaked  urine? - Y  Do you have problems with loss of bowel control? - Y  Managing your Medications? - Y  Managing your Finances? - Y  Housekeeping or managing your Housekeeping? - Y  Some recent data might be hidden    Fall/Depression Screening:  PHQ 2/9 Scores 10/24/2016 10/18/2016 09/10/2016 08/23/2016 08/12/2016 07/09/2016 06/06/2016  PHQ - 2 Score 0 0 0 0 0 0 0    Assessment: CSW completed home visit on 10/24/16. THN RNCM also came earlier today to complete routine home visit. Patient's aide [name] was present during part of the session. CSW came to patient's residence in order to complete home visit this week but patient did not come to the door. Patient stated that she was here but did not hear CSW knocking at the door. Patient reports that she has "good news." She states that the two programs that this CSW put her on the wait list for in order to gain an aide ([place]) she is now off the wait list for both programs. Patient reports that [name] from [facility] will be coming every six months and that another aide will be coming twice a week. CSW completed call to [facility] and spoke to [place]. She reports that patient will pay $12.00 per hour (based on her income) for an aide to come her residence 2x per week for 1  hour to assist with bathing. She reports that she will be coming every 3 months to do a supervision visit of the CNA aide. Patient will contact CNA aide in order to schedule first visit. CSW contacted [facility] but was unable to reach anyone to inquire. CSW left a HIPPA compliant voice message requesting call back. However, patient states that she was informed that a Education officer, museum will be contacting her soon in order to schedule a home assessment in order to start [place]. CSW educated patient on both programs.  Patient reports that she is doing well. She states that her brother [name] came by last night and brought her dinner. She states that she looks  forwards to Wednesdays because her brother brings her a dinner plate from a restaurant that includes homemade spaghetti, salad, bread and cake. She reports that she and her aide [name] continue to look through her belongings to see what they can get rid of. Patient's home has made a drastic change in appearance and safety since [facility] was first involved.   Patient reports that she is now interested in attending a [employer]. CSW educated patient on the [facility], [place] and [employer]. Patient is not interested in either [facility] or [facility]ACE but is interested in the [facility] and [facility]. She reports that she is wanting to gain more socialization. CSW will mail patient information on both [employer] as well as their calendars for this month.   Patient reports that she is completing sitting exercises that her physical therapist provided her. She reports that she will complete these exercises today after I leave and wanted to show CSW what the exercise demonstration papers look like.   [facility] CM Care Plan Problem One   Flowsheet Row Most Recent Value  Care Plan Problem One  Need for ongoing support in the home since [name] admission  Role Documenting the Problem One  Clinical Social Worker  Care Plan for Problem One  Active  THN Long Term Goal (31-90 days)  Patient will utilize her aide, support network, community resources and coping methods within 90 days to improve overall health and well being  [facility] Long Term Goal Start Date  08/08/16  [facility] Long Term Goal Met Date  10/24/16  Interventions for Problem One Long Term Goal  Goal met.  THN CM Short Term Goal #1 (0-30 days)  Over the next 30 days, patient will attend all scheduled medical appointments and have stable transportation to each visit  THN CM Short Term Goal #1 Start Date  08/08/16  [facility] CM Short Term Goal #1 Met Date  09/10/16  Interventions for Short Term Goal #1  Goal met. Brother [name] took her to  appointment in [place].  THN CM Short Term Goal #2 (0-30 days)  Patient will increase socialization within 30 days due to isolation.  THN CM Short Term Goal #2 Start Date  09/10/16  [facility] CM Short Term Goal #2 Met Date  10/24/16  Interventions for Short Term Goal #2  Goal met. Patient has attended church events and socialized with family members.  THN CM Short Term Goal #3 (0-30 days)  Patient will start [facility] services within 30 days as evidenced by patient report  THN CM Short Term Goal #3 Start Date  10/24/16  Interventions for Short Tern Goal #3  CSW has contacted [facility] and they have agreed to start services. Patient will follow up with CNA  aide in order to start receiving services.  THN CM Short Term Goal #4 (0-30 days)  Patient will strongly consider going to a senior center in order to gain socialization within 30 days as evidenced by patient report  THN CM Short Term Goal #4 Start Date  10/24/16  Interventions for Short Term Goal #4  CSW has reviewed all senior centers in [facility] during home visit and will mail out resources for her to hold on to and refer back. She will talk to aide to see if aide will be willing to transport her to senior center. CSW will continue to encourage and educate patient.      Plan: CSW will route encounter to PCP. CSW will follow up within 30 days. CSW will send request to [place] Management Assistant to mail out [employer] resources.  [name], BSW, MSW, [email or web address] Phone: [phone] Fax: [phone]

## 2016-10-25 ENCOUNTER — Ambulatory Visit: Payer: Self-pay | Admitting: Licensed Clinical Social Worker

## 2016-10-25 NOTE — Patient Outreach (Signed)
[place] [facility]) Care Management  10/25/2016  [name] Oct 26, 1940 [id number]   Request received from [name], LCSW to mail patient [facility] calendar and senior resources.   [name]  [name]

## 2016-10-29 ENCOUNTER — Telehealth: Payer: Self-pay | Admitting: *Deleted

## 2016-10-29 NOTE — Telephone Encounter (Signed)
Manuela Schwartz with [name] called and requested verbal orders to extend patient's Skilled Nursing home health. Verbal orders given.

## 2016-11-01 ENCOUNTER — Encounter: Payer: Self-pay | Admitting: Internal Medicine

## 2016-11-01 ENCOUNTER — Ambulatory Visit (INDEPENDENT_AMBULATORY_CARE_PROVIDER_SITE_OTHER): Payer: Medicare Other | Admitting: Internal Medicine

## 2016-11-01 VITALS — BP 128/80 | HR 60 | Temp 97.3°F

## 2016-11-01 DIAGNOSIS — M4722 Other spondylosis with radiculopathy, cervical region: Secondary | ICD-10-CM | POA: Diagnosis not present

## 2016-11-01 DIAGNOSIS — I5022 Chronic systolic (congestive) heart failure: Secondary | ICD-10-CM

## 2016-11-01 DIAGNOSIS — G20 Parkinson's disease: Secondary | ICD-10-CM

## 2016-11-01 DIAGNOSIS — G8929 Other chronic pain: Secondary | ICD-10-CM | POA: Diagnosis not present

## 2016-11-01 DIAGNOSIS — I10 Essential (primary) hypertension: Secondary | ICD-10-CM

## 2016-11-01 DIAGNOSIS — I872 Venous insufficiency (chronic) (peripheral): Secondary | ICD-10-CM | POA: Diagnosis not present

## 2016-11-01 DIAGNOSIS — G20A1 Parkinson's disease without dyskinesia, without mention of fluctuations: Secondary | ICD-10-CM

## 2016-11-01 DIAGNOSIS — I110 Hypertensive heart disease with heart failure: Secondary | ICD-10-CM | POA: Diagnosis not present

## 2016-11-01 MED ORDER — PIMAVANSERIN TARTRATE 17 MG PO TABS: 2.0000 | ORAL_TABLET | Freq: Every day | ORAL | 5 refills | Status: AC

## 2016-11-01 NOTE — Progress Notes (Signed)
Location:  [facility] clinic Provider:  Hazelgrace Bonham L. Mariea Clonts, D.O., C.M.D.  Code Status: DNR Goals of Care:  Advanced Directives 09/19/2016  Does Patient Have a Medical Advance Directive? Yes  Type of Paramedic of [place];Living will  Does patient want to make changes to medical advance directive? -  Copy of [place] in Chart? No - copy requested  Would patient like information on creating a medical advance directive? -  Pre-existing out of facility DNR order (yellow form or pink MOST form) -   Chief Complaint  Patient presents with  . Medical Management of Chronic Issues    6week follow-up    HPI: Patient is a 76 y.o. female seen today for medical management of chronic diseases/ 6 week follow up.    She had a great deal of trouble waking up this am.  Her caregiver was banging on the doors and windows trying to get her aroused.  She actually had to call her brother and he came over to let the caregiver in and check on [name].  They had just had the locks changed and now caregiver has key.    Continues to have delusions that people are working in her home at night. She was scared that people were at the door.  Has hallucination of the people hammering and using tools.  She thinks her sister is in her house and wants to put her in the nursing home.  She lived in her home in 81.    Otherwise no new things.  They went to the grocery store and [employer] yesterday.  Speech worse the past two days.  Leg was frozen sticking out this am.  She also went to the senior center and did exercises there.    BP good.    Past Medical History:  Diagnosis Date  . Acute on chronic systolic heart failure ([place])   . Anemia   . Anxiety   . Arthritis   . Cataract left  . Cellulitis and abscess of leg, except foot   . Cellulitis of lower leg 12/24/2011  . CHF (congestive heart failure) ([place])   . Chronic bronchitis ([place])    "usually get it q yr"  . Chronic  systolic heart failure ([place])   . GERD (gastroesophageal reflux disease)   . Glaucoma left  . Helicobacter pylori (H. pylori)   . History of bleeding peptic ulcer   . History of blood transfusion    "don't remember why"  . Hypercholesterolemia   . Hypertension   . Hypotension, unspecified   . Hypothyroidism   . Parkinson's disease   . Pneumonia   . Sleep apnea    "had test years ago; insurance wouldn't pay for mask so I never had one" (04/27/2015)  . Stroke [facility]) X 3   "that  was the reason I got Parkinson's"    Past Surgical History:  Procedure Laterality Date  . BACK SURGERY    . ESOPHAGOGASTRODUODENOSCOPY  09/26/2011   Procedure: ESOPHAGOGASTRODUODENOSCOPY (EGD);  Surgeon: Zenovia Jarred, MD;  Location: [facility] ENDOSCOPY;  Service: Gastroenterology;  Laterality: N/A;  To be done at bedside.  . ESOPHAGOGASTRODUODENOSCOPY N/A 09/30/2013   Procedure: ESOPHAGOGASTRODUODENOSCOPY (EGD);  Surgeon: [name], MD;  Location: [place];  Service: Endoscopy;  Laterality: N/A;  . ESOPHAGOGASTRODUODENOSCOPY N/A 11/25/2013   Procedure: ESOPHAGOGASTRODUODENOSCOPY (EGD);  Surgeon: [name], MD;  Location: [facility];  Service: Endoscopy;  Laterality: N/A;  . HEMIARTHROPLASTY HIP Right 09/24/2007   /  notes 01/23/2011  . KYPHOPLASTY  06/2009   T12/notes 07/17/2009  . NISSEN FUNDOPLICATION     "had OR for GERD"  . TENDON REPAIR     Gluteus medius Archie Endo 01/23/2011  . THYROIDECTOMY    . TONSILLECTOMY      Allergies  Allergen Reactions  . Codeine Other (See Comments)    Unknown allergic reaction  . Penicillins Other (See Comments)    Has patient had a PCN reaction causing immediate rash, facial/tongue/throat swelling, SOB or lightheadedness with hypotension: No Has patient had a PCN reaction causing severe rash involving mucus membranes or skin necrosis: No Has patient had a PCN reaction that required hospitalization No Has patient had a PCN reaction occurring within the last 10 years: No If  all of the above answers are "NO", then may proceed with Cephalosporin use. Pt passed out in doctor's office after penicillin dose    Allergies as of 11/01/2016      Reactions   Codeine Other (See Comments)   Unknown allergic reaction   Penicillins Other (See Comments)   Has patient had a PCN reaction causing immediate rash, facial/tongue/throat swelling, SOB or lightheadedness with hypotension: No Has patient had a PCN reaction causing severe rash involving mucus membranes or skin necrosis: No Has patient had a PCN reaction that required hospitalization No Has patient had a PCN reaction occurring within the last 10 years: No If all of the above answers are "NO", then may proceed with Cephalosporin use. Pt passed out in doctor's office after penicillin dose      Medication List       Accurate as of 11/01/16 10:32 AM. Always use your most recent med list.          aspirin EC 81 MG tablet Take 81 mg by mouth daily.   carbidopa-levodopa 25-100 MG tablet Commonly known as:  SINEMET IR Take 1 at 6a, 1 1/2 pills at 9a, 1 at 1p, 1 1/2 pills at 3p, 1 at 6pm, 1 1/2 pills at 9pm   docusate sodium 100 MG capsule Commonly known as:  COLACE Take 100 mg by mouth daily. Reported on 02/28/2016   ferrous sulfate 325 (65 FE) MG tablet Take 325 mg by mouth daily.   furosemide 40 MG tablet Commonly known as:  LASIX Take 1 tablet (40 mg total) by mouth daily.   latanoprost 0.005 % ophthalmic solution Commonly known as:  XALATAN Place 1 drop into the left eye daily at 6 PM.   LORazepam 0.5 MG tablet Commonly known as:  ATIVAN Take 1 tablet (0.5 mg total) by mouth 2 (two) times daily as needed for anxiety.   metoprolol succinate 50 MG 24 hr tablet Commonly known as:  TOPROL-XL Take 1 tablet (50 mg total) by mouth daily.   multivitamin with minerals tablet Take 1 tablet by mouth daily.   nystatin cream Commonly known as:  MYCOSTATIN Apply 1 application topically 2 (two) times daily.     pramipexole 1 MG tablet Commonly known as:  MIRAPEX Take 1 tablet (1 mg total) by mouth 3 (three) times daily.   SYSTANE OP Place 1 drop into both eyes daily.   triamcinolone cream 0.1 % Commonly known as:  KENALOG Apply 1 application topically 2 (two) times daily.       Review of Systems:  Review of Systems  Constitutional: Positive for malaise/fatigue. Negative for chills and fever.  HENT: Positive for hearing loss.   Eyes: Negative for blurred vision.  Respiratory: Positive for  shortness of breath. Negative for cough.        No change in sob  Cardiovascular: Negative for chest pain, palpitations and leg swelling.  Gastrointestinal: Negative for abdominal pain, blood in stool, constipation and melena.  Genitourinary: Negative for dysuria.  Musculoskeletal: Positive for back pain. Negative for falls.  Skin: Negative for itching and rash.  Neurological: Positive for tremors and weakness. Negative for dizziness and loss of consciousness.  Endo/Heme/Allergies: Bruises/bleeds easily.  Psychiatric/Behavioral: Positive for hallucinations and memory loss. Negative for depression. The patient is not nervous/anxious and does not have insomnia.     Health Maintenance  Topic Date Due  . DEXA SCAN  06/06/2005  . ZOSTAVAX  09/22/2017 (Originally 06/06/2000)  . TETANUS/TDAP  09/22/2017 (Originally 06/07/1960)  . INFLUENZA VACCINE  Completed  . PNA vac Low Risk Adult  Completed    Physical Exam: Vitals:   11/01/16 1011  BP: 128/80  Pulse: 60  Temp: 97.3 F (36.3 C)  TempSrc: Oral  SpO2: 90%   There is no height or weight on file to calculate BMI. Physical Exam  Constitutional: She appears well-developed and well-nourished. No distress.  Cardiovascular: Intact distal pulses.   irreg irreg  Pulmonary/Chest: Effort normal and breath sounds normal. No respiratory distress.  Abdominal: Soft. Bowel sounds are normal.  Musculoskeletal:  Comes in wheelchair, leans right  chronically  Neurological: She is alert.  Skin: Skin is warm and dry.  Chronic venous statis dermatitis bilateral legs  Psychiatric: She has a normal mood and affect.    Labs reviewed: Basic Metabolic Panel:  Recent Labs  05/25/16 1134  05/27/16 0316 05/29/16 0840 05/31/16 0325  06/12/16 0627 09/19/16 2212 10/15/16 0557  NA  --   < > 139 137 138  < > 141 143 139  K  --   < > 4.1 3.6 4.1  < > 3.6 4.2 4.1  CL  --   < > 102 100* 97*  < > 108 111 106  CO2  --   < > 32 25 29  < > 27 27 26   GLUCOSE  --   < > 93 158* 131*  < > 115* 104* 92  BUN  --   < > 23* 28* 38*  < > 18 17 15   CREATININE  --   < > 1.00 1.06* 1.15*  < > 0.83 0.80 0.97  CALCIUM  --   < > 9.1 8.8* 9.3  < > 8.8* 8.5* 9.1  MG  --   --  2.2 2.5* 2.6*  --   --   --   --   TSH 0.762  --   --   --   --   --   --   --   --   < > = values in this interval not displayed. Liver Function Tests:  Recent Labs  06/06/16 0935 06/10/16 0957  AST 16 22  ALT 7 13*  ALKPHOS 66 63  BILITOT 0.5 0.5  PROT 6.4 7.5  ALBUMIN 3.7 3.6   No results for input(s): LIPASE, AMYLASE in the last 8760 hours. No results for input(s): AMMONIA in the last 8760 hours. CBC:  Recent Labs  06/10/16 0957  06/12/16 0627 09/19/16 2212 10/15/16 0557  WBC 7.1  < > 10.0 8.7 8.4  NEUTROABS 4.9  --   --  5.9 6.2  HGB 11.8*  < > 10.3* 12.8 12.7  HCT 39.3  < > 34.2* 39.0 40.0  MCV 79.6  < >  79.2 85.3 87.9  PLT 190  < > 183 154 129*  < > = values in this interval not displayed. Lipid Panel: No results for input(s): CHOL, HDL, LDLCALC, TRIG, CHOLHDL, LDLDIRECT in the last 8760 hours. No results found for: HGBA1C  Procedures since last visit: Ct Head Wo Contrast  Result Date: 10/15/2016 CLINICAL DATA:  76 year old hypertensive female with Parkinson's disease post fall striking head. Initial encounter. EXAM: CT HEAD WITHOUT CONTRAST CT CERVICAL SPINE WITHOUT CONTRAST TECHNIQUE: Multidetector CT imaging of the head and cervical spine was  performed following the standard protocol without intravenous contrast. Multiplanar CT image reconstructions of the cervical spine were also generated. COMPARISON:  09/19/2016 CT. FINDINGS: CT HEAD FINDINGS Exam is motion degraded. Brain: No intracranial hemorrhage or CT evidence of large acute infarct. Remote left frontal lobe infarct. Global atrophy without hydrocephalus. Prominent pineal gland/ calcifications stable. Otherwise no intracranial mass lesion noted on this unenhanced exam. Vascular: Vascular calcifications. Skull: No skull fracture. Sinuses/Orbits: No acute orbital abnormality. Minimal mucosal thickening ethmoid sinus air cells. Other: Nonspecific subcutaneous calcified lesions unchanged. CT CERVICAL SPINE FINDINGS Exam is motion degraded. Alignment: Unchanged from prior exam. Skull base and vertebrae: No cervical spine fracture noted. Soft tissues and spinal canal: No abnormal prevertebral soft tissues wound. 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 If ligamentous injury or cord injury were of high clinical concern, MR imaging could be obtained for further delineation. Upper chest: Scarring stable. Other: Prior right thyroid surgery. Enlarged left lobe with thyroid gland with substernal extension displacement surrounding structures similar to prior exam. Carotid bifurcation calcifications. IMPRESSION: Exam is motion degraded. No skull fracture or intracranial hemorrhage. No cervical spine fracture. Remainder of findings similar to prior exams as noted above. Electronically Signed   By: Genia Del M.D.   On: 10/15/2016 06:35   Ct Cervical Spine Wo Contrast  Result Date: 10/15/2016 CLINICAL DATA:  76 year old hypertensive female with Parkinson's disease post fall striking head. Initial encounter. EXAM: CT HEAD WITHOUT CONTRAST CT CERVICAL SPINE WITHOUT CONTRAST  TECHNIQUE: Multidetector CT imaging of the head and cervical spine was performed following the standard protocol without intravenous contrast. Multiplanar CT image reconstructions of the cervical spine were also generated. COMPARISON:  09/19/2016 CT. FINDINGS: CT HEAD FINDINGS Exam is motion degraded. Brain: No intracranial hemorrhage or CT evidence of large acute infarct. Remote left frontal lobe infarct. Global atrophy without hydrocephalus. Prominent pineal gland/ calcifications stable. Otherwise no intracranial mass lesion noted on this unenhanced exam. Vascular: Vascular calcifications. Skull: No skull fracture. Sinuses/Orbits: No acute orbital abnormality. Minimal mucosal thickening ethmoid sinus air cells. Other: Nonspecific subcutaneous calcified lesions unchanged. CT CERVICAL SPINE FINDINGS Exam is motion degraded. Alignment: Unchanged from prior exam. Skull base and vertebrae: No cervical spine fracture noted. Soft tissues and spinal canal: No abnormal prevertebral soft tissues wound. 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 If ligamentous injury or cord injury were of high clinical concern, MR imaging could be obtained for further delineation. Upper chest: Scarring stable. Other: Prior right thyroid surgery. Enlarged left lobe with thyroid gland with substernal extension displacement surrounding structures similar to prior exam. Carotid bifurcation calcifications. IMPRESSION: Exam is motion degraded. No skull fracture or intracranial hemorrhage. No cervical spine fracture. Remainder of findings similar to prior exams as noted above. Electronically Signed   By: Remo Lipps  Jeannine Kitten M.D.   On: 10/15/2016 06:35   Dg Foot Complete Right  Result Date: 10/15/2016 CLINICAL DATA:  76 year old female with fall and right foot pain. EXAM: RIGHT FOOT COMPLETE - 3+ VIEW COMPARISON:  None.  FINDINGS: There is no acute fracture or dislocation. The bones are osteopenic. There is degenerative changes of the first MTP joint with osteophyte formation. There is mild hallucis valgus deformity. The soft tissues appear unremarkable. IMPRESSION: No acute fracture or dislocation. Osteopenia. Electronically Signed   By: Anner Crete M.D.   On: 10/15/2016 06:17    Assessment/Plan 1. Parkinson's disease ([place]) - worsened due to fatigue today it seems (she may have overdone her activity yesterday shopping and exercising) -cont sinemet and mirapex - Pimavanserin Tartrate (NUPLAZID) 17 MG TABS; Take 2 tablets by mouth daily.  Dispense: 60 tablet; Refill: 5  2. 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  Dispense: 60 tablet; Refill: 5 -there are no alternative agents to this -pt I'm sure qualifies for the financial assistance program for all of her PD drugs if cost is prohibitive (expect it is)  3. Venous stasis dermatitis of both lower extremities -ongoing, no signs of cellulitis, cont compression hose  4. Chronic systolic congestive heart failure (HCC) -stable, refused weight today, but no signs of acute chf  5. Essential hypertension -bp at goal with current therapy  Labs/tests ordered:  No orders of the defined types were placed in this encounter.  Next appt: 12/13/2016 (6wk f/u)   [name] L. Christine Morton, D.O. [facility] [address], [place] [address] Cell Phone (Mon-Fri 8am-5pm):  [phone] On Call:  [phone] & follow prompts after 5pm & weekends Office Phone:  [phone] Office Fax:  [phone]

## 2016-11-05 ENCOUNTER — Emergency Department (HOSPITAL_COMMUNITY): Payer: Medicare Other

## 2016-11-05 ENCOUNTER — Encounter (HOSPITAL_COMMUNITY): Payer: Self-pay | Admitting: Emergency Medicine

## 2016-11-05 ENCOUNTER — Emergency Department (HOSPITAL_COMMUNITY)
Admission: EM | Admit: 2016-11-05 | Discharge: 2016-11-06 | Disposition: A | Discharge status: Other Acute Inpatient Hospital | Payer: Medicare Other | Attending: Emergency Medicine | Admitting: Emergency Medicine

## 2016-11-05 DIAGNOSIS — F02818 Dementia in other diseases classified elsewhere, unspecified severity, with other behavioral disturbance: Secondary | ICD-10-CM

## 2016-11-05 DIAGNOSIS — E039 Hypothyroidism, unspecified: Secondary | ICD-10-CM | POA: Insufficient documentation

## 2016-11-05 DIAGNOSIS — I5023 Acute on chronic systolic (congestive) heart failure: Secondary | ICD-10-CM | POA: Insufficient documentation

## 2016-11-05 DIAGNOSIS — F0391 Unspecified dementia with behavioral disturbance: Secondary | ICD-10-CM | POA: Diagnosis not present

## 2016-11-05 DIAGNOSIS — F99 Mental disorder, not otherwise specified: Secondary | ICD-10-CM | POA: Diagnosis not present

## 2016-11-05 DIAGNOSIS — I517 Cardiomegaly: Secondary | ICD-10-CM | POA: Diagnosis not present

## 2016-11-05 DIAGNOSIS — Z96641 Presence of right artificial hip joint: Secondary | ICD-10-CM | POA: Insufficient documentation

## 2016-11-05 DIAGNOSIS — Z8673 Personal history of transient ischemic attack (TIA), and cerebral infarction without residual deficits: Secondary | ICD-10-CM | POA: Insufficient documentation

## 2016-11-05 DIAGNOSIS — R440 Auditory hallucinations: Secondary | ICD-10-CM | POA: Diagnosis not present

## 2016-11-05 DIAGNOSIS — I110 Hypertensive heart disease with heart failure: Secondary | ICD-10-CM | POA: Insufficient documentation

## 2016-11-05 DIAGNOSIS — F0281 Dementia in other diseases classified elsewhere with behavioral disturbance: Secondary | ICD-10-CM | POA: Diagnosis not present

## 2016-11-05 DIAGNOSIS — G20 Parkinson's disease: Secondary | ICD-10-CM | POA: Insufficient documentation

## 2016-11-05 DIAGNOSIS — G3183 Dementia with Lewy bodies: Secondary | ICD-10-CM

## 2016-11-05 DIAGNOSIS — Z7982 Long term (current) use of aspirin: Secondary | ICD-10-CM | POA: Insufficient documentation

## 2016-11-05 DIAGNOSIS — R4182 Altered mental status, unspecified: Secondary | ICD-10-CM | POA: Diagnosis not present

## 2016-11-05 DIAGNOSIS — F0280 Dementia in other diseases classified elsewhere without behavioral disturbance: Secondary | ICD-10-CM | POA: Diagnosis present

## 2016-11-05 DIAGNOSIS — Z0000 Encounter for general adult medical examination without abnormal findings: Secondary | ICD-10-CM

## 2016-11-05 LAB — COMPREHENSIVE METABOLIC PANEL
ALT: <5 U/L — ABNORMAL LOW (ref 14–54)
ANION GAP: 7 (ref 5–15)
AST: 19 U/L (ref 15–41)
Albumin: 3.7 g/dL (ref 3.5–5.0)
Alkaline Phosphatase: 58 U/L (ref 38–126)
BILIRUBIN TOTAL: 0.6 mg/dL (ref 0.3–1.2)
BUN: 19 mg/dL (ref 6–20)
CHLORIDE: 107 mmol/L (ref 101–111)
CO2: 27 mmol/L (ref 22–32)
Calcium: 8.9 mg/dL (ref 8.9–10.3)
Creatinine, Ser: 0.83 mg/dL (ref 0.44–1.00)
Glucose, Bld: 113 mg/dL — ABNORMAL HIGH (ref 65–99)
POTASSIUM: 4 mmol/L (ref 3.5–5.1)
Sodium: 141 mmol/L (ref 135–145)
TOTAL PROTEIN: 6.4 g/dL — AB (ref 6.5–8.1)

## 2016-11-05 LAB — CBC WITH DIFFERENTIAL/PLATELET
BASOS ABS: 0 10*3/uL (ref 0.0–0.1)
Basophils Relative: 1 %
EOS PCT: 2 %
Eosinophils Absolute: 0.2 10*3/uL (ref 0.0–0.7)
HEMATOCRIT: 39.3 % (ref 36.0–46.0)
HEMOGLOBIN: 12.9 g/dL (ref 12.0–15.0)
LYMPHS ABS: 1 10*3/uL (ref 0.7–4.0)
Lymphocytes Relative: 12 %
MCH: 28.2 pg (ref 26.0–34.0)
MCHC: 32.8 g/dL (ref 30.0–36.0)
MCV: 86 fL (ref 78.0–100.0)
Monocytes Absolute: 0.8 10*3/uL (ref 0.1–1.0)
Monocytes Relative: 9 %
NEUTROS ABS: 6.6 10*3/uL (ref 1.7–7.7)
NEUTROS PCT: 77 %
PLATELETS: 126 10*3/uL — AB (ref 150–400)
RBC: 4.57 MIL/uL (ref 3.87–5.11)
RDW: 16.1 % — ABNORMAL HIGH (ref 11.5–15.5)
WBC: 8.6 10*3/uL (ref 4.0–10.5)

## 2016-11-05 LAB — URINALYSIS, ROUTINE W REFLEX MICROSCOPIC
Bilirubin Urine: NEGATIVE
GLUCOSE, UA: NEGATIVE mg/dL
HGB URINE DIPSTICK: NEGATIVE
Ketones, ur: 5 mg/dL — AB
LEUKOCYTES UA: NEGATIVE
Nitrite: NEGATIVE
Protein, ur: NEGATIVE mg/dL
SPECIFIC GRAVITY, URINE: 1.018 (ref 1.005–1.030)
pH: 6 (ref 5.0–8.0)

## 2016-11-05 MED ORDER — LATANOPROST 0.005 % OP SOLN
1.0000 [drp] | Freq: Every day | OPHTHALMIC | Status: DC
  Administered 2016-11-05: 1 [drp] via OPHTHALMIC
  Filled 2016-11-05: qty 2.5

## 2016-11-05 MED ORDER — PRAMIPEXOLE DIHYDROCHLORIDE 1 MG PO TABS
1.0000 mg | ORAL_TABLET | Freq: Three times a day (TID) | ORAL | Status: DC
  Administered 2016-11-05 – 2016-11-06 (×3): 1 mg via ORAL
  Filled 2016-11-05 (×6): qty 1

## 2016-11-05 MED ORDER — CARBIDOPA-LEVODOPA 25-100 MG PO TABS
1.0000 | ORAL_TABLET | Freq: Three times a day (TID) | ORAL | Status: DC
  Administered 2016-11-05 – 2016-11-06 (×2): 1 via ORAL
  Filled 2016-11-05 (×5): qty 1

## 2016-11-05 MED ORDER — DOXYCYCLINE HYCLATE 100 MG PO TABS
100.0000 mg | ORAL_TABLET | Freq: Once | ORAL | Status: AC
  Administered 2016-11-05: 100 mg via ORAL
  Filled 2016-11-05: qty 1

## 2016-11-05 MED ORDER — ADULT MULTIVITAMIN W/MINERALS CH
1.0000 | ORAL_TABLET | Freq: Every day | ORAL | Status: DC
  Administered 2016-11-05 – 2016-11-06 (×2): 1 via ORAL
  Filled 2016-11-05 (×2): qty 1

## 2016-11-05 MED ORDER — CARBIDOPA-LEVODOPA 25-100 MG PO TABS: 1.0000 | ORAL_TABLET | Freq: Four times a day (QID) | ORAL | Status: DC

## 2016-11-05 MED ORDER — ARTIFICIAL TEARS OP OINT
TOPICAL_OINTMENT | Freq: Every day | OPHTHALMIC | Status: DC
  Administered 2016-11-05 – 2016-11-06 (×2): via OPHTHALMIC
  Filled 2016-11-05: qty 3.5

## 2016-11-05 MED ORDER — METOPROLOL SUCCINATE ER 50 MG PO TB24
50.0000 mg | ORAL_TABLET | Freq: Every day | ORAL | Status: DC
  Administered 2016-11-05 – 2016-11-06 (×2): 50 mg via ORAL
  Filled 2016-11-05 (×2): qty 1

## 2016-11-05 MED ORDER — CARBIDOPA-LEVODOPA 25-100 MG PO TABS
1.5000 | ORAL_TABLET | Freq: Three times a day (TID) | ORAL | Status: DC
  Administered 2016-11-05 – 2016-11-06 (×3): 1.5 via ORAL
  Filled 2016-11-05 (×6): qty 1.5

## 2016-11-05 MED ORDER — PIMAVANSERIN TARTRATE 17 MG PO TABS: 2.0000 | ORAL_TABLET | Freq: Every day | ORAL | Status: DC

## 2016-11-05 MED ORDER — DOCUSATE SODIUM 100 MG PO CAPS
100.0000 mg | ORAL_CAPSULE | Freq: Every day | ORAL | Status: DC
  Administered 2016-11-05 – 2016-11-06 (×2): 100 mg via ORAL
  Filled 2016-11-05 (×2): qty 1

## 2016-11-05 MED ORDER — FUROSEMIDE 40 MG PO TABS
40.0000 mg | ORAL_TABLET | Freq: Every day | ORAL | Status: DC
  Administered 2016-11-05 – 2016-11-06 (×2): 40 mg via ORAL
  Filled 2016-11-05 (×2): qty 1

## 2016-11-05 MED ORDER — ASPIRIN EC 81 MG PO TBEC
81.0000 mg | DELAYED_RELEASE_TABLET | Freq: Every day | ORAL | Status: DC
  Administered 2016-11-05 – 2016-11-06 (×2): 81 mg via ORAL
  Filled 2016-11-05 (×2): qty 1

## 2016-11-05 MED ORDER — DOXYCYCLINE HYCLATE 100 MG PO CAPS: 100.0000 mg | ORAL_CAPSULE | Freq: Two times a day (BID) | ORAL | 0 refills | Status: DC

## 2016-11-05 MED ORDER — LORAZEPAM 0.5 MG PO TABS: 0.5000 mg | ORAL_TABLET | Freq: Two times a day (BID) | ORAL | Status: DC | PRN

## 2016-11-05 MED ORDER — RISPERIDONE 0.5 MG PO TABS
0.5000 mg | ORAL_TABLET | Freq: Two times a day (BID) | ORAL | Status: DC
  Administered 2016-11-05 – 2016-11-06 (×3): 0.5 mg via ORAL
  Filled 2016-11-05 (×3): qty 1

## 2016-11-05 MED ORDER — FERROUS SULFATE 325 (65 FE) MG PO TABS
325.0000 mg | ORAL_TABLET | Freq: Every day | ORAL | Status: DC
  Administered 2016-11-05 – 2016-11-06 (×2): 325 mg via ORAL
  Filled 2016-11-05 (×2): qty 1

## 2016-11-05 NOTE — Discharge Instructions (Signed)
Transfer to [facility] psychiatric unit.

## 2016-11-05 NOTE — ED Notes (Signed)
Tom with [place] at bedside speaking with brother "[name]" at bedside.

## 2016-11-05 NOTE — Progress Notes (Signed)
CSW received call from [place] at [facility] stating pt was now accepted and they are ready for pt.  CSW gave pt's RN info and # for report [phone] and accepting physician is Dr. Alonna Minium and room number is 410-B.  CSW was asked by Shirlee Limerick at [place] to fax copy of custody order to [phone] and CSW subsequently faxed order.  Alphonse Guild. Abrar Koone, Latanya Presser, LCAS Clinical Social Worker Ph: [phone]

## 2016-11-05 NOTE — ED Notes (Signed)
Attempted to feed breakfast to the pt, she kept spitting food out and laughing.

## 2016-11-05 NOTE — ED Notes (Signed)
Bed: [facility] Expected date:  Expected time:  Means of arrival:  Comments: 76 yo M hallucinations

## 2016-11-05 NOTE — ED Notes (Signed)
Pharmacy tech to bedside to rectify medications pts family brought from home.

## 2016-11-05 NOTE — ED Provider Notes (Signed)
[place] team indicates pt accepted at [facility], Dr Alonna Minium accepting physician.  Patient is alert, talking on phone, nad.  Vitals:   11/05/16 0445 11/05/16 1157  BP: 149/94 155/83  Pulse: 69 (!) 59  Resp: 19 20  Temp:     Patient currently appears stable for transfer.      Lajean Saver, MD 11/05/16 [phone]

## 2016-11-05 NOTE — BH Assessment (Signed)
[facility] Assessment Progress Note  Per Corena Pilgrim, MD, this pt requires psychiatric hospitalization at this time.  Shirlee Limerick calls from [facility] to report that they will agree to admit pt provided that she is placed under IVC.  Per Waylan Boga, DNP, pt meets criteria for IVC; EDP Lajean Saver, MD concurs with this judgment, and has initiated IVC.  IVC documents have been faxed to [facility], and at [employer] confirms receipt.  As of this writing, service of the Findings and Custody Order is pending, but Petition and First Examination have been faxed to [facility].  At 14:10 Shirlee Limerick calls back from [facility] to report that pt has been pre-accepted to their facility by Dr Pablo Ledger; they will call when the bed becomes available.  Theodoro Clock concurs with this decision.  Pt's nurse has been notified, and agrees to call report to [phone].  Pt is to be transported via [facility] when the time comes.  Jalene Mullet, [place] Triage Specialist [phone]
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
## 2016-11-05 NOTE — BH Assessment (Signed)
Assessment completed. Consulted with Patriciaann Clan, PA-C who recommends AM psych evaluation.   Rosalin Hawking, LCSW Therapeutic Triage Specialist [place] 11/05/2016 4:37 AM
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
## 2016-11-05 NOTE — ED Notes (Signed)
Pts brother [name] at bedside [phone]. Giving detailed of report about last night and what occurred at home. Story seems to line up with counselor report. Brother is supportive and wants patient to be evaluated, reports that he understands what needs to occur.
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
## 2016-11-05 NOTE — ED Notes (Signed)
Meds have not been verified by pharmacy.  
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
## 2016-11-06 DIAGNOSIS — Z79899 Other long term (current) drug therapy: Secondary | ICD-10-CM | POA: Diagnosis not present

## 2016-11-06 DIAGNOSIS — K219 Gastro-esophageal reflux disease without esophagitis: Secondary | ICD-10-CM | POA: Diagnosis not present

## 2016-11-06 DIAGNOSIS — F0391 Unspecified dementia with behavioral disturbance: Secondary | ICD-10-CM | POA: Diagnosis not present

## 2016-11-06 DIAGNOSIS — H409 Unspecified glaucoma: Secondary | ICD-10-CM | POA: Diagnosis not present

## 2016-11-06 DIAGNOSIS — I5023 Acute on chronic systolic (congestive) heart failure: Secondary | ICD-10-CM | POA: Diagnosis not present

## 2016-11-06 DIAGNOSIS — G4730 Sleep apnea, unspecified: Secondary | ICD-10-CM | POA: Diagnosis not present

## 2016-11-06 DIAGNOSIS — Z880 Allergy status to penicillin: Secondary | ICD-10-CM | POA: Diagnosis not present

## 2016-11-06 DIAGNOSIS — Z7982 Long term (current) use of aspirin: Secondary | ICD-10-CM | POA: Diagnosis not present

## 2016-11-06 DIAGNOSIS — I5032 Chronic diastolic (congestive) heart failure: Secondary | ICD-10-CM | POA: Diagnosis not present

## 2016-11-06 DIAGNOSIS — Z8673 Personal history of transient ischemic attack (TIA), and cerebral infarction without residual deficits: Secondary | ICD-10-CM | POA: Diagnosis not present

## 2016-11-06 DIAGNOSIS — G20 Parkinson's disease: Secondary | ICD-10-CM | POA: Diagnosis not present

## 2016-11-06 DIAGNOSIS — N289 Disorder of kidney and ureter, unspecified: Secondary | ICD-10-CM | POA: Diagnosis not present

## 2016-11-06 DIAGNOSIS — Z886 Allergy status to analgesic agent status: Secondary | ICD-10-CM | POA: Diagnosis not present

## 2016-11-06 DIAGNOSIS — F29 Unspecified psychosis not due to a substance or known physiological condition: Secondary | ICD-10-CM | POA: Diagnosis not present

## 2016-11-06 DIAGNOSIS — R4182 Altered mental status, unspecified: Secondary | ICD-10-CM | POA: Diagnosis not present

## 2016-11-06 DIAGNOSIS — D649 Anemia, unspecified: Secondary | ICD-10-CM | POA: Diagnosis not present

## 2016-11-06 DIAGNOSIS — I872 Venous insufficiency (chronic) (peripheral): Secondary | ICD-10-CM | POA: Diagnosis not present

## 2016-11-06 DIAGNOSIS — I10 Essential (primary) hypertension: Secondary | ICD-10-CM | POA: Diagnosis not present

## 2016-11-06 DIAGNOSIS — R443 Hallucinations, unspecified: Secondary | ICD-10-CM | POA: Diagnosis not present

## 2016-11-06 DIAGNOSIS — I110 Hypertensive heart disease with heart failure: Secondary | ICD-10-CM | POA: Diagnosis not present

## 2016-11-06 DIAGNOSIS — F0151 Vascular dementia with behavioral disturbance: Secondary | ICD-10-CM | POA: Diagnosis not present

## 2016-11-06 DIAGNOSIS — E039 Hypothyroidism, unspecified: Secondary | ICD-10-CM | POA: Diagnosis not present

## 2016-11-06 DIAGNOSIS — M1990 Unspecified osteoarthritis, unspecified site: Secondary | ICD-10-CM | POA: Diagnosis not present
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
## 2016-11-07 ENCOUNTER — Other Ambulatory Visit: Payer: Self-pay | Admitting: Licensed Clinical Social Worker
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
## 2016-11-08 ENCOUNTER — Telehealth: Payer: Self-pay

## 2016-11-08 NOTE — Telephone Encounter (Signed)
So much for the nuplazid we were trying to get for her.
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
## 2016-11-08 NOTE — Telephone Encounter (Signed)
[name] came by the office today and left a walk in patient form that stated the following:  "[name] was put in a behavioral unit in [place] and they say that she is refusing to take her meds so they are not giving her her meds at all. Call me at [phone] if you need to."

## 2016-11-08 NOTE — Telephone Encounter (Signed)
Noted.  We have filled in the paperwork to get nuplazid through her insurance, but have not yet heard anything.  I'm so sorry to hear she is going through this.

## 2016-11-11 ENCOUNTER — Other Ambulatory Visit: Payer: Self-pay | Admitting: Licensed Clinical Social Worker

## 2016-11-11 NOTE — Patient Outreach (Addendum)
[facility]) Care Management  11/11/2016  [name] 08-17-1941 [id number]   Assessment- CSW completed chart review. Patient is currently at [facility]. [facility] RNCM contacted CSW and we discussed case and plan of care. CSW will contact [facility] within one - two weeks to gain updates on expected discharge back home.  Plan-CSW will continue to follow patient and await for discharge back home.  [name], BSW, MSW, [email or web address] Phone: [phone] Fax: [phone]

## 2016-11-20 ENCOUNTER — Other Ambulatory Visit: Payer: Self-pay | Admitting: Licensed Clinical Social Worker

## 2016-11-20 NOTE — Patient Outreach (Signed)
[facility]) Care Management  11/20/2016  [name] 04-Sep-1941 [id number]  Assessment- CSW completed call to [name] and spoke to caseworker [name] at [phone].She reports that she was getting ready to contact patient's PCP office. CSW provided her with PCP number. She states that patient's brother will be picking her up tomorrow and that she will be returning home. She shares that she will fax records to [facility] fax number. Caseworker shares that patient's brother informed her that [facility] and [place] provide aide services and will need to be updated. CSW will contact [name] with [facility] to update her and caseworker is agreeable to contacting [place] (CSW provided number.) Caseworker reports that they did complete a brain scan at facility and determined that patient has dementia but that physicians there are confident in patient returning home with the follow recommendations: follow up PCP appointment, psychiatrist appointment and neurologist appointment.   CSW completed call to [place] with [facility] and spoke to her. [name] was already updated by patient's brother in regards to her stay at [facility]. CSW updated her on patient's expected day to return which is tomorrow on 11/21/16. [name] reports that she will inform her CNA Aide to resume services starting this Fridays on 11/22/16.    CSW completed outreach call to [employer] and spoke to Geophysical data processor. CSW provided update to program manager in regards to her return home and patient's meals will resume back on 11/22/16.   Plan- CSW will route encounter to PCP and RNCM. CSW will follow up with patient within one week to ensure that she has all services and resources in place.   [facility] CM Care Plan Problem One   Flowsheet Row Most Recent Value  Care Plan Problem One  Need for ongoing support in the home since [name] admission  Role Documenting the Problem One  Clinical Social Worker  Care  Plan for Problem One  Active  [facility] Long Term Goal (31-90 days)  Patient will utilize her aide, support network, community resources and coping methods within 90 days to improve overall health and well being  [facility] Long Term Goal Start Date  08/08/16  [facility] Long Term Goal Met Date  10/24/16  Interventions for Problem One Long Term Goal  Goal met.  [facility] CM Short Term Goal #1 (0-30 days)  Over the next 30 days, patient will attend all scheduled medical appointments and have stable transportation to each visit  [facility] CM Short Term Goal #1 Start Date  08/08/16  [facility] CM Short Term Goal #1 Met Date  09/10/16  Interventions for Short Term Goal #1  Goal met. Brother [name] took her to appointment in [place].  [facility] CM Short Term Goal #2 (0-30 days)  Patient will increase socialization within 30 days due to isolation.  [facility] CM Short Term Goal #2 Start Date  09/10/16  [facility] CM Short Term Goal #2 Met Date  10/24/16  Interventions for Short Term Goal #2  Goal met. Patient has attended church events and socialized with family members.  [facility] CM Short Term Goal #3 (0-30 days)  Patient will start [facility] services within 30 days as evidenced by patient report  [facility] CM Short Term Goal #3 Start Date  10/24/16  [facility] CM Short Term Goal #3 Met Date  11/20/16  Interventions for Short Tern Goal #3  Goal met.  [facility] CM Short Term Goal #4 (0-30 days)  Patient will strongly consider going to a senior center in order to  gain socialization within 30 days as evidenced by patient report  [facility] CM Short Term Goal #4 Start Date  11/20/16  Interventions for Short Term Goal #4  Goal ongoing as patient was hospitalized for some time during this time.  [facility] CM Short Term Goal #5 (0-30 days)  Patient will make follow up appointments with psychiatrist and PCP within 30 days after leaving [place] per patient or family report  [facility] CM Short Term Goal #5 Start Date  11/20/16  Interventions for Short Term Goal #5  CSW will  assist with coordinating services and ensure that she appropriately follows up with providers as suggested by inpatient [facility] clinical staff members. CSW will update PCP as well.     [name], BSW, MSW, [email or web address] Phone: [phone] Fax: [phone]

## 2016-11-21 ENCOUNTER — Other Ambulatory Visit: Payer: Self-pay | Admitting: Licensed Clinical Social Worker

## 2016-11-21 ENCOUNTER — Ambulatory Visit: Payer: Self-pay | Admitting: *Deleted

## 2016-11-21 NOTE — Patient Outreach (Signed)
[facility]) Care Management  11/21/2016  [name] Jan 10, 1941 [id number]   Assessment- CSW completed call to [facility] and spoke to caseworker [name]. CSW provided PCP's fax number in order for patient's records to be faxed there. Patient will be discharging today around 11:00 am.  Plan-CSW will continue to follow patient and provide social work assistance. CSW will outreach to patient within one week.  [name], BSW, MSW, [email or web address] Phone: [phone] Fax: [phone]

## 2016-11-22 ENCOUNTER — Encounter: Payer: Self-pay | Admitting: *Deleted

## 2016-11-22 ENCOUNTER — Other Ambulatory Visit: Payer: Self-pay | Admitting: *Deleted

## 2016-11-22 NOTE — Patient Outreach (Signed)
[facility]) [place] Telephone Outreach, Transition of Care attempt #1  11/22/2016  [name] 1941/05/12 [id number]  Unsuccessful telephone outreach to [name], 76 y.o. female well known to [place] CMfor multiple hospital admissions/ ED visits and safety concerns. [name] most recently admitted to [facility] in [place], [place] following ED visit 11/05/16 for hallucinations, agitation, and delusions related to chronic disease state of Parkinson's Disease.  Received notification from [place] that patient was discharged back to her home under self-care from [facility] psychiatric hospital yesterday, on November 21, 2016.   Patient is now followed by [place] for transition of care after recent hospitalization at [facility] psychiatric facility.  I was unable to leave patient HIPAA compliant voice mail message today, as her phone rang without answer.  Automated outgoing message eventually received, stating "please enter your remote access code," but would not allow me to leave a voice mail message.  Plan:  Will re-attempt [place] telephone outreach next week if I do not hear back from patient first.  [name], RN, BSN, [place] Coordinator [facility] Care Management  [phone]

## 2016-11-25 ENCOUNTER — Other Ambulatory Visit: Payer: Self-pay | Admitting: *Deleted

## 2016-11-25 ENCOUNTER — Encounter: Payer: Self-pay | Admitting: *Deleted

## 2016-11-25 ENCOUNTER — Encounter: Payer: Self-pay | Admitting: Internal Medicine

## 2016-11-25 ENCOUNTER — Ambulatory Visit (INDEPENDENT_AMBULATORY_CARE_PROVIDER_SITE_OTHER): Payer: Medicare Other | Admitting: Internal Medicine

## 2016-11-25 ENCOUNTER — Other Ambulatory Visit: Payer: Self-pay | Admitting: Licensed Clinical Social Worker

## 2016-11-25 VITALS — BP 110/70 | HR 73 | Temp 98.0°F

## 2016-11-25 DIAGNOSIS — I5022 Chronic systolic (congestive) heart failure: Secondary | ICD-10-CM

## 2016-11-25 DIAGNOSIS — I10 Essential (primary) hypertension: Secondary | ICD-10-CM

## 2016-11-25 DIAGNOSIS — G20A1 Parkinson's disease without dyskinesia, without mention of fluctuations: Secondary | ICD-10-CM

## 2016-11-25 DIAGNOSIS — R5381 Other malaise: Secondary | ICD-10-CM | POA: Diagnosis not present

## 2016-11-25 DIAGNOSIS — G20 Parkinson's disease: Secondary | ICD-10-CM | POA: Diagnosis not present

## 2016-11-25 DIAGNOSIS — F068 Other specified mental disorders due to known physiological condition: Secondary | ICD-10-CM

## 2016-11-25 NOTE — Patient Outreach (Signed)
[facility]) Care Management  11/25/2016  [name] [birth year] [id number]   Assessment- CSW completed outreach to patient's residence on 11/25/16 but was unable to reach her. CSW was also unable to leave a voice message.  CSW received update from [place]. She reports that patient has been staying with her brother since she returned back home from [facility]. She states that brother informed her that patient's PCP has arranged for patient to go to [facility] and gain rehab therapy for three weeks. He informed RNCM that patient has been "doing fine" and "had a good weekend."   Plan-CSW will await for [name] admission and then complete [name] visit.  [name], BSW, MSW, [email or web address] Phone: [phone] Fax: [phone]

## 2016-11-25 NOTE — Patient Outreach (Signed)
[facility]) Care Management  11/25/2016  OLANI JARACZ 1941/09/09 [id number]   Assessment- CSW received in basket message from [place] who is a Writer for [employer]. She has been helping Ms. Gist with finding dental assistance and informed CSW that she had an appointment scheduled [facility] in [place] on Feb. 22 but that she was unable to go due to being at [facility]. CSW informed her that patient would likely want to reschedule that appointment. CSW also informed her that patient is currently residing with her brother. Contact number was provided.  CSW completed call to patient's brother [name] and he answered. He provided updates in regards to patient's plan to go to [facility]. He states that he has NOT informed [employer] and [facility] that patient was residing with him. CSW will inform all involved programs once she goes to [name].   Plan-CSW will continue to follow case and provide social work support. Patient's brother is agreeable to contacting CSW with updates in regards to [name] placement.   [name], BSW, MSW, [email or web address] Phone: [phone] Fax: [phone]
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
## 2016-11-26 ENCOUNTER — Other Ambulatory Visit: Payer: Self-pay | Admitting: Licensed Clinical Social Worker

## 2016-11-26 DIAGNOSIS — I10 Essential (primary) hypertension: Secondary | ICD-10-CM | POA: Diagnosis not present

## 2016-11-26 DIAGNOSIS — R600 Localized edema: Secondary | ICD-10-CM | POA: Diagnosis not present

## 2016-11-26 DIAGNOSIS — R1312 Dysphagia, oropharyngeal phase: Secondary | ICD-10-CM | POA: Diagnosis not present

## 2016-11-26 DIAGNOSIS — M25551 Pain in right hip: Secondary | ICD-10-CM | POA: Diagnosis not present

## 2016-11-26 DIAGNOSIS — R41841 Cognitive communication deficit: Secondary | ICD-10-CM | POA: Diagnosis not present

## 2016-11-26 DIAGNOSIS — T148XXA Other injury of unspecified body region, initial encounter: Secondary | ICD-10-CM | POA: Diagnosis not present

## 2016-11-26 DIAGNOSIS — M7981 Nontraumatic hematoma of soft tissue: Secondary | ICD-10-CM | POA: Diagnosis not present

## 2016-11-26 DIAGNOSIS — Z66 Do not resuscitate: Secondary | ICD-10-CM | POA: Diagnosis not present

## 2016-11-26 DIAGNOSIS — X58XXXA Exposure to other specified factors, initial encounter: Secondary | ICD-10-CM | POA: Diagnosis not present

## 2016-11-26 DIAGNOSIS — I110 Hypertensive heart disease with heart failure: Secondary | ICD-10-CM | POA: Diagnosis not present

## 2016-11-26 DIAGNOSIS — S7001XA Contusion of right hip, initial encounter: Secondary | ICD-10-CM | POA: Diagnosis not present

## 2016-11-26 DIAGNOSIS — F0151 Vascular dementia with behavioral disturbance: Secondary | ICD-10-CM | POA: Diagnosis not present

## 2016-11-26 DIAGNOSIS — I679 Cerebrovascular disease, unspecified: Secondary | ICD-10-CM | POA: Diagnosis not present

## 2016-11-26 DIAGNOSIS — E039 Hypothyroidism, unspecified: Secondary | ICD-10-CM | POA: Diagnosis not present

## 2016-11-26 DIAGNOSIS — G20 Parkinson's disease: Secondary | ICD-10-CM | POA: Diagnosis not present

## 2016-11-26 DIAGNOSIS — Y929 Unspecified place or not applicable: Secondary | ICD-10-CM | POA: Diagnosis not present

## 2016-11-26 DIAGNOSIS — R278 Other lack of coordination: Secondary | ICD-10-CM | POA: Diagnosis not present

## 2016-11-26 DIAGNOSIS — F411 Generalized anxiety disorder: Secondary | ICD-10-CM | POA: Diagnosis not present

## 2016-11-26 DIAGNOSIS — M6281 Muscle weakness (generalized): Secondary | ICD-10-CM | POA: Diagnosis not present

## 2016-11-26 DIAGNOSIS — F29 Unspecified psychosis not due to a substance or known physiological condition: Secondary | ICD-10-CM | POA: Diagnosis not present

## 2016-11-26 DIAGNOSIS — Z7982 Long term (current) use of aspirin: Secondary | ICD-10-CM | POA: Diagnosis not present

## 2016-11-26 DIAGNOSIS — I519 Heart disease, unspecified: Secondary | ICD-10-CM | POA: Diagnosis not present

## 2016-11-26 DIAGNOSIS — Y939 Activity, unspecified: Secondary | ICD-10-CM | POA: Diagnosis not present

## 2016-11-26 DIAGNOSIS — Y999 Unspecified external cause status: Secondary | ICD-10-CM | POA: Diagnosis not present

## 2016-11-26 DIAGNOSIS — R2689 Other abnormalities of gait and mobility: Secondary | ICD-10-CM | POA: Diagnosis not present

## 2016-11-26 DIAGNOSIS — I5023 Acute on chronic systolic (congestive) heart failure: Secondary | ICD-10-CM | POA: Diagnosis not present

## 2016-11-26 DIAGNOSIS — I509 Heart failure, unspecified: Secondary | ICD-10-CM | POA: Diagnosis not present

## 2016-11-26 DIAGNOSIS — F0391 Unspecified dementia with behavioral disturbance: Secondary | ICD-10-CM | POA: Diagnosis not present

## 2016-11-26 DIAGNOSIS — Z8673 Personal history of transient ischemic attack (TIA), and cerebral infarction without residual deficits: Secondary | ICD-10-CM | POA: Diagnosis not present

## 2016-11-26 NOTE — Patient Outreach (Signed)
[facility]) Care Management  11/26/2016  [name] Apr 13, 1941 [id number]   Assessment- CSW completed call to patient's brother [name] and successfully reached him. CSW was informed that he is currently checking patient into [facility] and that she will stay there for three weeks. He reports that she is doing good. He put patient on the phone and CSW offered emotional support. Patient is agreeable to [name] visit for 11/28/16.  Plan-CSW will update PCP and [facility] RNCM. CSW will complete [name] visit this week.  [name], BSW, MSW, [email or web address] Phone: [phone] Fax: [phone]

## 2016-11-27 ENCOUNTER — Ambulatory Visit: Payer: Self-pay | Admitting: *Deleted

## 2016-11-27 ENCOUNTER — Other Ambulatory Visit: Payer: Self-pay | Admitting: Licensed Clinical Social Worker

## 2016-11-27 ENCOUNTER — Other Ambulatory Visit: Payer: Self-pay | Admitting: *Deleted

## 2016-11-27 DIAGNOSIS — G20 Parkinson's disease: Secondary | ICD-10-CM | POA: Diagnosis not present

## 2016-11-27 DIAGNOSIS — F29 Unspecified psychosis not due to a substance or known physiological condition: Secondary | ICD-10-CM | POA: Diagnosis not present

## 2016-11-27 DIAGNOSIS — I509 Heart failure, unspecified: Secondary | ICD-10-CM | POA: Diagnosis not present

## 2016-11-27 DIAGNOSIS — I519 Heart disease, unspecified: Secondary | ICD-10-CM | POA: Diagnosis not present

## 2016-11-27 NOTE — Patient Outreach (Signed)
[facility]) Care Management [place] Telephone Outreach, Care Coordination  11/27/2016  [name] July 20, 1941 [id number]  Secure communication via EMR received from [name], [facility] RN CSW, re: Euleta Belson, 76 y.o.femalewell known to [place] CMfor multiple hospitalizations/ ED visits and onging safety concerns, notitifying of patient's admission to [facility] for rehabilitation post IP psychiatric admission.  Thoen most recently admitted to [facility] February 13- November 21, 2016, under IVC in [place], [place] following ED visit 11/05/16 for hallucinations, agitation, and delusions related to chronic disease state of Parkinson's Disease.  Plan:  Will close [place] program and follow patient's progress while at [name] through collaboration with [facility] CSW.  [name], RN, BSN, [facility] Care Management  [phone]

## 2016-11-27 NOTE — Patient Outreach (Addendum)
[facility]) Care Management  11/27/2016  JAHNAE MCADOO 05-09-1941 [id number]  Assessment- CSW completed call to [employer] and spoke to director Oren Bracket. CSW informed him that patient is now at [facility] and will be there for up to three weeks. He appreciated this update and informed CSW that he will stop her [employer] for the time being. CSW was informed that they can halt [employer] for up to 90 days for hospitalizations and [name] placements.  CSW also completed call to [facility] program and left a message with [name] with the update that patient is now at [facility] for three weeks.  [facility] CM Care Plan Problem One   Flowsheet Row Most Recent Value  Care Plan Problem One  Need for ongoing support in the home since [name] admission  Role Documenting the Problem One  Clinical Social Worker  Care Plan for Problem One  Active  [facility]N Long Term Goal (31-90 days)  Patient will utilize her aide, support network, community resources and coping methods within 90 days to improve overall health and well being  [facility] Long Term Goal Start Date  08/08/16  [facility] Long Term Goal Met Date  10/24/16  Interventions for Problem One Long Term Goal  Goal met.  [facility] CM Short Term Goal #1 (0-30 days)  Over the next 30 days, patient will attend all scheduled medical appointments and have stable transportation to each visit  [facility] CM Short Term Goal #1 Start Date  08/08/16  [facility] CM Short Term Goal #1 Met Date  09/10/16  Interventions for Short Term Goal #1  Goal met. Brother [name] took her to appointment in [place].  [facility]N CM Short Term Goal #2 (0-30 days)  Patient will increase socialization within 30 days due to isolation.  [facility] CM Short Term Goal #2 Start Date  09/10/16  [facility] CM Short Term Goal #2 Met Date  10/24/16  Interventions for Short Term Goal #2  Goal met. Patient has attended church events and socialized with family members.  [facility] CM Short Term Goal #3 (0-30 days)  Patient will start  [facility] services within 30 days as evidenced by patient report  [facility]N CM Short Term Goal #3 Start Date  10/24/16  [facility] CM Short Term Goal #3 Met Date  11/20/16  Interventions for Short Tern Goal #3  Goal met.  THN CM Short Term Goal #4 (0-30 days)  Patient will strongly consider going to a senior center in order to gain socialization within 30 days as evidenced by patient report  [facility]N CM Short Term Goal #4 Start Date  11/20/16  Interventions for Short Term Goal #4  Goal ongoing. Patient went to [name] and CSW will complete [name] visit and review socialization activities during visit.  [facility]N CM Short Term Goal #5 (0-30 days)  Patient will make follow up appointments with psychiatrist and PCP within 30 days after leaving [place] per patient or family report  [facility] CM Short Term Goal #5 Start Date  11/20/16  Interventions for Short Term Goal #5  Patient is now at [facility] and CSW will follow up on appointments    [facility] CM Care Plan Problem Two   Flowsheet Row Most Recent Value  Care Plan Problem Two  [name] admission as well as [facility] inpatient behavioral health admission.  Role Documenting the Problem Two  Clinical Social Worker  Care Plan for Problem Two  Active  Interventions for  Problem Two Long Term Goal   CSW will complete [name] visits and assist patient with social work needs to ensure that she has a safe and stable discharge back home with needed services, medical equipment and resources. CSW will follow up with [employer] and [facility] to ensure that services start again. CSW will work with family, PCP, [facility] RNCM and [name] Education officer, museum.  THN Long Term Goal (31-90) days  Patient will have a safe and stable discharge back home from [name] within 90 days per patient and family report  THN Long Term Goal Start Date  11/27/16     Plan-CSW will complete [name] visit tomorrow and provide social work assistance.  [name], BSW, MSW, [email or web address] Phone: [phone] Fax: [phone]

## 2016-11-28 ENCOUNTER — Other Ambulatory Visit: Payer: Self-pay | Admitting: Licensed Clinical Social Worker

## 2016-11-28 NOTE — Patient Outreach (Signed)
[facility]) Care Management  [facility] Social Work  11/28/2016  LORRAINA SPRING 03-03-1941 [id number]  Encounter Medications:  Outpatient Encounter Prescriptions as of 11/28/2016  Medication Sig  . aspirin EC 81 MG tablet Take 81 mg by mouth daily.  . carbidopa-levodopa (SINEMET IR) 25-100 MG tablet Take 1.5 tablets by mouth 3 (three) times daily. 9am, 3pm, 9pm  . docusate sodium (COLACE) 100 MG capsule Take 100 mg by mouth daily. Reported on 02/28/2016  . ferrous sulfate 325 (65 FE) MG tablet Take 325 mg by mouth daily.  . furosemide (LASIX) 20 MG tablet Take 20 mg by mouth daily.   Marland Kitchen latanoprost (XALATAN) 0.005 % ophthalmic solution Place 1 drop into the left eye daily at 6 PM.   . lisinopril (PRINIVIL,ZESTRIL) 2.5 MG tablet Take by mouth.  Marland Kitchen LORazepam (ATIVAN) 0.5 MG tablet Take 0.5 mg by mouth 2 (two) times daily as needed.  . metoprolol tartrate (LOPRESSOR) 25 MG tablet Take 12.5 mg by mouth 2 (two) times daily.   . Multiple Vitamins-Minerals (MULTIVITAMIN WITH MINERALS) tablet Take 1 tablet by mouth daily.  Marland Kitchen nystatin cream (MYCOSTATIN) Apply 1 application topically 2 (two) times daily.  Marland Kitchen OLANZapine (ZYPREXA) 5 MG tablet Take by mouth.  Marland Kitchen Pimavanserin Tartrate (NUPLAZID) 17 MG TABS Take 2 tablets by mouth daily.  Vladimir Faster Glycol-Propyl Glycol (SYSTANE OP) Place 1 drop into both eyes daily.   . pramipexole (MIRAPEX) 1 MG tablet Take 1 tablet (1 mg total) by mouth 3 (three) times daily.  . Skin Protectants, Misc. (EUCERIN) cream Apply topically.  . traZODone (DESYREL) 50 MG tablet Take by mouth.  . triamcinolone cream (KENALOG) 0.1 % Apply 1 application topically 2 (two) times daily.   No facility-administered encounter medications on file as of 11/28/2016.     Functional Status:  In your present state of health, do you have any difficulty performing the following activities: 11/05/2016 10/15/2016  Hearing? N -  Vision? N -  Difficulty concentrating or making decisions?  - -  Walking or climbing stairs? Y -  Dressing or bathing? Y -  Doing errands, shopping? - N  Conservation officer, nature and eating ? - -  Using the Toilet? - -  In the past six months, have you accidently leaked urine? - -  Do you have problems with loss of bowel control? - -  Managing your Medications? - -  Managing your Finances? - -  Housekeeping or managing your Housekeeping? - -  Some recent data might be hidden    Fall/Depression Screening:  PHQ 2/9 Scores 11/25/2016 10/24/2016 10/18/2016 09/10/2016 08/23/2016 08/12/2016 07/09/2016  PHQ - 2 Score 0 0 0 0 0 0 0    Assessment: CSW completed [name] visit at [facility] on 11/28/16. Patient had 15 day inpatient admission at [facility]. Patient has a history of Parkinson's and was admitted for psychosis, vascular dementia with behavioral disturbance and had been having hallucinations. Patient was in the dining hall and had just finished her lunch. Patient reports that her appetite is very good. She states that she has been experiencing some dizziness whenever she gets up. She denies experiencing any pain or discomfort. She denies experiencing any SOB or chest pain. Patient shares that she continues to struggle with her sleep. She went on to say that she wakes up several times throughout the night. Patient fell asleep twice during session today. Patient repots that her blood pressure is good. She denies having any recent hallucinations. She  states that her legs are not as swollen as they usually are but that they are still red and irritated. Patient had PCP appointment on 11/25/16 and FL2 was completed for [name] placement. Patient went to [facility] back in October of 2017. She really liked the facility and even made friends there. CSW educated her on LTC placement and patient stated "I'm not sure if I can afford it." CSW provided education on LTC placement and encouraged her to consider this option. Patient reports that her aide [name]  (who was providing her the most personal care services) has had to find work elsewhere since patient has been hospitalized. CSW questioned if she would be able to provide personal care services to her once (if) she returned home and she stated that she does not know. CSW is very concerned for patient's safety if she does return home.  Patient shares that she started physical therapy yesterday. She stated that she was put on new medication Nuplazid but that it was $900.00 and she cannot afford to take it. She reports that she is trying to pay for minutes on her cell phone so that she can talk to friends and family while at the [name] since they do not have a phone in her room. Patient's speech therapist came to do therapy with her while CSW was visiting. She reports that patient tried to walk (without any assistance) down the hall yesterday and that she had to take her back to her room. Patient reports that she was "trying to see if I could make it." Patient denied having a fall.   CSW provided patient with the monthly activity calendar for [name] and reviewed all of the activities. CSW highlighted the activities for her that she said she would be interested in which include: manicures, arts and crafts, game show trivia, music, bingo and jeopardy.   CSW had front desk page for [name] social worker Manuela Schwartz but she was not available. CSW left a message with the front desk for social worker to contact [place].   Plan: CSW will continue to follow patient and will await for return call from Dale with [name] in order to discuss possible LTC placement.  [name], BSW, MSW, [email or web address] Phone: [phone] Fax: [phone]

## 2016-12-05 ENCOUNTER — Other Ambulatory Visit: Payer: Self-pay | Admitting: Licensed Clinical Social Worker

## 2016-12-05 NOTE — Patient Outreach (Signed)
[facility]) Care Management  12/05/2016  [name] [birth year] [id number]  Assessment- CSW has not heard back from [name] social worker at [facility] and completed an additional outreach today to facility. CSW was unable to reach [name] social worker but left an additional voice message requesting a return call with updates on patient.  Plan-CSW will make additional outreach within one week if no return call has been made.  [name], BSW, MSW, [email or web address] Phone: [phone] Fax: [phone]

## 2016-12-09 ENCOUNTER — Other Ambulatory Visit: Payer: Self-pay | Admitting: Licensed Clinical Social Worker

## 2016-12-09 NOTE — Patient Outreach (Signed)
[facility]) Care Management  12/09/2016  [name] 12-22-1940 [id number]  Assessment- CSW completed outreach attempt to [facility] [name] on 12/09/16. CSW has not heard back from [name] social worker but left another HIPPA compliant voice message requesting return call.  Plan-CSW will complete [name] visit tomorrow and continue to provide social work services.  [name], BSW, MSW, [name] Phone: [phone] Fax: [phone]

## 2016-12-10 ENCOUNTER — Other Ambulatory Visit: Payer: Self-pay | Admitting: Licensed Clinical Social Worker

## 2016-12-10 NOTE — Patient Outreach (Signed)
[facility]) Care Management  [facility] Social Work  12/10/2016  [name] 07-Aug-1941 [id number]   Encounter Medications:  Outpatient Encounter Prescriptions as of 12/10/2016  Medication Sig  . aspirin EC 81 MG tablet Take 81 mg by mouth daily.  . carbidopa-levodopa (SINEMET IR) 25-100 MG tablet Take 1.5 tablets by mouth 3 (three) times daily. 9am, 3pm, 9pm  . docusate sodium (COLACE) 100 MG capsule Take 100 mg by mouth daily. Reported on 02/28/2016  . ferrous sulfate 325 (65 FE) MG tablet Take 325 mg by mouth daily.  . furosemide (LASIX) 20 MG tablet Take 20 mg by mouth daily.   Marland Kitchen latanoprost (XALATAN) 0.005 % ophthalmic solution Place 1 drop into the left eye daily at 6 PM.   . lisinopril (PRINIVIL,ZESTRIL) 2.5 MG tablet Take by mouth.  Marland Kitchen LORazepam (ATIVAN) 0.5 MG tablet Take 0.5 mg by mouth 2 (two) times daily as needed.  . metoprolol tartrate (LOPRESSOR) 25 MG tablet Take 12.5 mg by mouth 2 (two) times daily.   . Multiple Vitamins-Minerals (MULTIVITAMIN WITH MINERALS) tablet Take 1 tablet by mouth daily.  Marland Kitchen nystatin cream (MYCOSTATIN) Apply 1 application topically 2 (two) times daily.  Marland Kitchen OLANZapine (ZYPREXA) 5 MG tablet Take by mouth.  Marland Kitchen Pimavanserin Tartrate (NUPLAZID) 17 MG TABS Take 2 tablets by mouth daily.  Vladimir Faster Glycol-Propyl Glycol (SYSTANE OP) Place 1 drop into both eyes daily.   . pramipexole (MIRAPEX) 1 MG tablet Take 1 tablet (1 mg total) by mouth 3 (three) times daily.  . Skin Protectants, Misc. (EUCERIN) cream Apply topically.  . traZODone (DESYREL) 50 MG tablet Take by mouth.  . triamcinolone cream (KENALOG) 0.1 % Apply 1 application topically 2 (two) times daily.   No facility-administered encounter medications on file as of 12/10/2016.     Functional Status:  In your present state of health, do you have any difficulty performing the following activities: 11/05/2016 10/15/2016  Hearing? N -  Vision? N -  Difficulty concentrating or making  decisions? - -  Walking or climbing stairs? Y -  Dressing or bathing? Y -  Doing errands, shopping? - N  Conservation officer, nature and eating ? - -  Using the Toilet? - -  In the past six months, have you accidently leaked urine? - -  Do you have problems with loss of bowel control? - -  Managing your Medications? - -  Managing your Finances? - -  Housekeeping or managing your Housekeeping? - -  Some recent data might be hidden    Fall/Depression Screening:  PHQ 2/9 Scores 11/28/2016 11/25/2016 10/24/2016 10/18/2016 09/10/2016 08/23/2016 08/12/2016  PHQ - 2 Score 0 0 0 0 0 0 0    Assessment: CSW completed [name] visit on 12/10/16 at [facility] with [facility] [name]. Patient was in her room when [facility] staff arrived. She shares that she is doing very well. She shares that her brothers having been visiting her daily. She states that her brother [name] lost his job recently and therefore has had more free time and has been coming by daily to keep her company. CSW provided motivational interviewing intervention to encourage patient to gain socialization while at [name]. She shares that she has successfully gained socialization at facility by attending bingo and church services. Patient was very excited discussing this as she won prizes at [employer]. She reports that she has made "a few friends" at facility. Patient denies experiencing any pain or discomfort. She denies having any issues with her appetite  but admits she continues to have issues with her sleep. Patient continues to wake up 2-3 times per night and then cannot go back to sleep. CSW encouraged her to discuss this concern with the facility physician. Patient's speech therapist reports that she is having "some good and some bad days" but that overall she is making progress during speech therapy. Patient reports that she is "walking some with walker." Patient denies experiencing any hallucinations or depressive symptoms. However, speech therapist reports that patient  was walking down the hall today stating that she had to get the "kids from the bus." CSW provided emotional support and resource education during [name] visit. CSW discussed LTC placement but patient shares that her plan is to return home once she is able to.   CSW met with [name] social worker and was informed that there is no set discharge date as of now. She reports that usually the facility therapist will inform her 3-5 days before a discharge is scheduled. She reports that they had a care team planning meeting on 11/29/16 with brother [name] and that the plan is for patient to return home. [name] social worker reports that patient has been very pleasant and compliant during her stay at [facility]. CSW provided business card and encouraged [name] social worker to contact this CSW once a discharge has been set.   THN CM Care Plan Problem One     Most Recent Value  Care Plan Problem One  Need for ongoing support in the home since [name] admission  Role Documenting the Problem One  Clinical Social Worker  Care Plan for Problem One  Active  THN Long Term Goal (31-90 days)  Patient will utilize her aide, support network, community resources and coping methods within 90 days to improve overall health and well being  [facility] Long Term Goal Start Date  08/08/16  [facility] Long Term Goal Met Date  10/24/16  Interventions for Problem One Long Term Goal  Goal met.  THN CM Short Term Goal #1 (0-30 days)  Over the next 30 days, patient will attend all scheduled medical appointments and have stable transportation to each visit  [facility]N CM Short Term Goal #1 Start Date  08/08/16  [facility] CM Short Term Goal #1 Met Date  09/10/16  Interventions for Short Term Goal #1  Goal met. Brother [name] took her to appointment in [place].  THN CM Short Term Goal #2 (0-30 days)  Patient will increase socialization within 30 days due to isolation.  THN CM Short Term Goal #2 Start Date  09/10/16  [facility] CM Short Term Goal #2 Met Date  10/24/16   Interventions for Short Term Goal #2  Goal met. Patient has attended church events and socialized with family members.  THN CM Short Term Goal #3 (0-30 days)  Patient will start [facility] services within 30 days as evidenced by patient report  THN CM Short Term Goal #3 Start Date  10/24/16  [facility] CM Short Term Goal #3 Met Date  11/20/16  Interventions for Short Tern Goal #3  Goal met.  THN CM Short Term Goal #4 (0-30 days)  Patient will strongly consider going to a senior center in order to gain socialization within 30 days as evidenced by patient report  THN CM Short Term Goal #4 Start Date  11/20/16  Interventions for Short Term Goal #4  Patient is at [facility] [name] for short term rehab but has been participating in activities at facility.  THN CM  Short Term Goal #5 (0-30 days)  Patient will make follow up appointments with psychiatrist and PCP within 30 days after leaving [place] per patient or family report  [facility] CM Short Term Goal #5 Start Date  11/20/16  [facility] CM Short Term Goal #5 Met Date  12/10/16  Interventions for Short Term Goal #5  Goal met. Patient has attended all needed appointments.    [facility] CM Care Plan Problem Two     Most Recent Value  Care Plan Problem Two  [name] admission as well as [facility] inpatient behavioral health admission.  Role Documenting the Problem Two  Clinical Social Worker  Care Plan for Problem Two  Active  Interventions for Problem Two Long Term Goal   CSW will complete [name] visits and assist patient with social work needs to ensure that she has a safe and stable discharge back home with needed services, medical equipment and resources. CSW will follow up with [employer] and [facility] to ensure that services start again. CSW will work with family, PCP, [facility] RNCM and [name] Education officer, museum.  THN Long Term Goal (31-90) days  Patient will have a safe and stable discharge back home from [name] within 90 days per patient and family  report  THN Long Term Goal Start Date  11/27/16     Plan: CSW will route encounter to PCP. CSW will follow up within one-two weeks with [name]. CSW will continue to provide social work assistance and support.  [name], BSW, MSW, [email or web address] Phone: [phone] Fax: [phone]

## 2016-12-13 ENCOUNTER — Telehealth: Payer: Self-pay

## 2016-12-13 ENCOUNTER — Other Ambulatory Visit: Payer: Self-pay | Admitting: Licensed Clinical Social Worker

## 2016-12-13 ENCOUNTER — Ambulatory Visit: Payer: Medicare Other | Admitting: Internal Medicine

## 2016-12-13 NOTE — Patient Outreach (Signed)
[facility]) Care Management  12/13/2016  [name] July 04, 1941 [id number]   Assessment-CSW completed outreach attempt today to patient's brother [name]. CSW unable to reach brother successfully. CSW was unable to leave a HIPPA compliant voice message as voice mail box has not been set up.  Plan-CSW will continue to follow patient's stay at [name] and await for discharge updates.  [name], BSW, MSW, [email or web address] Phone: [phone] Fax: [phone]

## 2016-12-13 NOTE — Telephone Encounter (Signed)
Should we wait until patient comes in for follow up to start therapy?

## 2016-12-13 NOTE — Telephone Encounter (Signed)
She will need the assistance with the cost.  Problem is that patient is currently at [facility] [name] so I am not overseeing her care directly.

## 2016-12-13 NOTE — Patient Outreach (Signed)
[facility]) Care Management  12/13/2016  [name] 10-Jan-1941 [id number]  Assessment- CSW received return call from patient's brother on 12/13/16. He reports that patient is doing "somewhat better" but asked for my clinical opinion. I informed him that I was concerned about her returning home without enough support in place. He shares that he is going to [name] today and is going to speak to the discharge planner. He states that he was informed that as long as patient continues to make progress in physical therapy then she can stay at facility up to 100 days. However, he is aware that this may not be the case for patient. He states that he has talked to patient's past caregiver [name] about residing with patient once she returns home and she informed him that that would not be something she could do. He states that he has thought about patient officially residing with him and he has also thought about patient residing permanently at [facility].He states that he has discussed these options with family and he continues to work towards making a decision.CSW provided support and education throughout this phone call. He states that he is now patient's financial POA and has been able to manage her bills for her. CSW requested that brother provide any updates to CSW when needed.  Plan-CSW will continue to provide social work support and assistance and follow up within one week.  [name], BSW, MSW, [email or web address] Phone: [phone] Fax: [phone]

## 2016-12-13 NOTE — Telephone Encounter (Signed)
Spoke with Huel Cote (drug Rep) she stated that she would put Nuplazid on hold until patient returns under our care she said just to give her a call and she will reinstate it.[phone]

## 2016-12-13 NOTE — Telephone Encounter (Signed)
Spoke with drug rep she stated that patient declined therapy w/ Nuplazid. Drug rep stated that if cost is a problem they could help with that.

## 2016-12-18 ENCOUNTER — Ambulatory Visit: Payer: Medicare Other | Admitting: Nurse Practitioner

## 2016-12-18 ENCOUNTER — Telehealth: Payer: Self-pay | Admitting: *Deleted

## 2016-12-18 NOTE — Telephone Encounter (Signed)
[facility] [phone].  She has been there for 2 wks.  They were not aware of appt.  Pt needing appt with Dr. Rexene Alberts.  Transportation LM to f/u with [place] about making appt too.

## 2016-12-19 ENCOUNTER — Ambulatory Visit: Payer: Self-pay | Admitting: Licensed Clinical Social Worker

## 2016-12-19 ENCOUNTER — Encounter: Payer: Self-pay | Admitting: Nurse Practitioner

## 2016-12-24 NOTE — Telephone Encounter (Signed)
I spoke to staff at [facility] and scheduled patient for appt on Monday at 1pm. They state that the nurse will call back if patient is unable to make that appt.

## 2016-12-25 ENCOUNTER — Other Ambulatory Visit: Payer: Self-pay | Admitting: Licensed Clinical Social Worker

## 2016-12-25 DIAGNOSIS — G20 Parkinson's disease: Secondary | ICD-10-CM | POA: Diagnosis not present

## 2016-12-25 DIAGNOSIS — I509 Heart failure, unspecified: Secondary | ICD-10-CM | POA: Diagnosis not present

## 2016-12-25 DIAGNOSIS — I519 Heart disease, unspecified: Secondary | ICD-10-CM | POA: Diagnosis not present

## 2016-12-25 DIAGNOSIS — I10 Essential (primary) hypertension: Secondary | ICD-10-CM | POA: Diagnosis not present

## 2016-12-25 DIAGNOSIS — E039 Hypothyroidism, unspecified: Secondary | ICD-10-CM | POA: Diagnosis not present

## 2016-12-25 DIAGNOSIS — F411 Generalized anxiety disorder: Secondary | ICD-10-CM | POA: Diagnosis not present

## 2016-12-25 DIAGNOSIS — F29 Unspecified psychosis not due to a substance or known physiological condition: Secondary | ICD-10-CM | POA: Diagnosis not present

## 2016-12-25 NOTE — Patient Outreach (Signed)
[name]) Care Management  [facility] Social Work  12/25/2016  MARKEYA MINCY 1940-12-17 [id number]  Encounter Medications:  Outpatient Encounter Prescriptions as of 12/25/2016  Medication Sig  . aspirin EC 81 MG tablet Take 81 mg by mouth daily.  . carbidopa-levodopa (SINEMET IR) 25-100 MG tablet Take 1.5 tablets by mouth 3 (three) times daily. 9am, 3pm, 9pm  . docusate sodium (COLACE) 100 MG capsule Take 100 mg by mouth daily. Reported on 02/28/2016  . ferrous sulfate 325 (65 FE) MG tablet Take 325 mg by mouth daily.  . furosemide (LASIX) 20 MG tablet Take 20 mg by mouth daily.   Marland Kitchen latanoprost (XALATAN) 0.005 % ophthalmic solution Place 1 drop into the left eye daily at 6 PM.   . lisinopril (PRINIVIL,ZESTRIL) 2.5 MG tablet Take by mouth.  Marland Kitchen LORazepam (ATIVAN) 0.5 MG tablet Take 0.5 mg by mouth 2 (two) times daily as needed.  . metoprolol tartrate (LOPRESSOR) 25 MG tablet Take 12.5 mg by mouth 2 (two) times daily.   . Multiple Vitamins-Minerals (MULTIVITAMIN WITH MINERALS) tablet Take 1 tablet by mouth daily.  Marland Kitchen nystatin cream (MYCOSTATIN) Apply 1 application topically 2 (two) times daily.  Marland Kitchen OLANZapine (ZYPREXA) 5 MG tablet Take by mouth.  Marland Kitchen Pimavanserin Tartrate (NUPLAZID) 17 MG TABS Take 2 tablets by mouth daily.  Vladimir Faster Glycol-Propyl Glycol (SYSTANE OP) Place 1 drop into both eyes daily.   . pramipexole (MIRAPEX) 1 MG tablet Take 1 tablet (1 mg total) by mouth 3 (three) times daily.  . Skin Protectants, Misc. (EUCERIN) cream Apply topically.  . triamcinolone cream (KENALOG) 0.1 % Apply 1 application topically 2 (two) times daily.   No facility-administered encounter medications on file as of 12/25/2016.     Functional Status:  In your present state of health, do you have any difficulty performing the following activities: 11/05/2016 10/15/2016  Hearing? N -  Vision? N -  Difficulty concentrating or making decisions? - -  Walking or climbing stairs? Y -  Dressing or  bathing? Y -  Doing errands, shopping? - N  Conservation officer, nature and eating ? - -  Using the Toilet? - -  In the past six months, have you accidently leaked urine? - -  Do you have problems with loss of bowel control? - -  Managing your Medications? - -  Managing your Finances? - -  Housekeeping or managing your Housekeeping? - -  Some recent data might be hidden    Fall/Depression Screening:  PHQ 2/9 Scores 11/28/2016 11/25/2016 10/24/2016 10/18/2016 09/10/2016 08/23/2016 08/12/2016  PHQ - 2 Score 0 0 0 0 0 0 0    Assessment: CSW completed [name] visit at [facility] on 12/25/16. Patient was in her room when CSW arrived. Patient reports "I feel like I'm getting stronger." She reports that she has not yet been to PT yet. Patient reports "I gained 5 points in my speech therapy since I've been here." She states that she is working hard during her speech therapy sessions. Patient denies being in any pain or discomfort at this time. She shares that her appetite continues to be "good." She shares that her sleep has improved over the last week. She states that she is no longer waking up in the middle of the night and having difficulty getting back to sleep. She reports that she not gained anymore socialization recently but plans on attending bingo tomorrow at the facility. She shares "I have made a few friends here." She reports that her  son [name] visited her yesterday and her other son [name] came by this past weekend and brought her flowers. Patient reports that she usually only walks with her walker in her room and uses her wheelchair around the facility. However, she stated that she was able to use her walker to walk to the nursing station with the assistance of staff members. Patient denies having any recent hallucinations or delusions. However, when CSW asked if she felt safe at facility she stated to [place] "I can't tell you that. It's a secret." She then went on to say "There is a mean man here. He doesn't want me to  get better. I have to hide from him. Two or three people have disappeared because of him and he is trying to to buy people her for slavery." CSW provided emotional support as patient seemed distressed when discussing this. Patient thanked CSW for keeping her company during today's visit. Patient shares that insurance continues to pay for her stay at facility and she is not sure when she will discharge but she is aware that she will reside with her brother once she leaves [name]. CSW was unable to talk to [name] discharge planner as she was not available.  Plan: CSW will continue to follow patient, provide social work emotional support and services and assist with [name] discharge.  [name], BSW, MSW, [email or web address] Phone: [phone] Fax: [phone]

## 2016-12-30 ENCOUNTER — Telehealth: Payer: Self-pay

## 2016-12-30 ENCOUNTER — Ambulatory Visit: Payer: Self-pay | Admitting: Neurology

## 2016-12-30 NOTE — Telephone Encounter (Signed)
Pt did not show today for her appt with Dr. Rexene Alberts.

## 2016-12-31 ENCOUNTER — Encounter: Payer: Self-pay | Admitting: Neurology

## 2017-01-02 ENCOUNTER — Other Ambulatory Visit: Payer: Self-pay | Admitting: Licensed Clinical Social Worker

## 2017-01-02 NOTE — Patient Outreach (Signed)
[facility]) Care Management  01/02/2017  [name] 14-Aug-1941 [id number]   Assessment- CSW completed [name] visit at [facility] on 01/02/17 with patient and [facility] CSW Raynaldo Opitz. CSW spoke with [name] social worker but Education officer, museum was unsure of any updates related to [name] discharge at this time. Patient had just finished Physical Therapy before CSW completed [name] visit. Patient stated that she is doing "well" and that her sleeping habits have improved recently. Patient stated that she was experiencing some back pain after completing PT but is no longer experiencing it. Patient shared that her brother [name] visited her yesterday and provided her with 3 pairs of pants and tops. She shared that her other brother [name] came back this past week and gave her more flowers. Patient denies experiencing any recent depressive symptoms at this time. Patient reports that she has implemented more socialization into her routine by attending [place] at the facility. She states "I won 3 times." CSW continues to educate patient on the importance of appropriate socialization. Patient shares that she has met someone at the facility as well. Patient shares that her appetite has been low these past few days but that this has not always been the case since being at [facility]. Patient reports that she continues to enjoy therapy and likes working with the staff members at [facility]. CSW discussed discharge planning with patient. She reports that she thinks that she will be at facility until June of 2018. CSW was unable to verify that with [name] social worker but will do so during next [name] visit.  THN CM Care Plan Problem One     Most Recent Value  Care Plan Problem One  Need for ongoing support in the home since [name] admission  Role Documenting the Problem One  Clinical Social Worker  Care Plan for Problem One  Active  THN Long Term Goal (31-90 days)  Patient will utilize her aide, support network,  community resources and coping methods within 90 days to improve overall health and well being  [facility] Long Term Goal Start Date  08/08/16  [facility] Long Term Goal Met Date  10/24/16  Interventions for Problem One Long Term Goal  Goal met.  THN CM Short Term Goal #1 (0-30 days)  Over the next 30 days, patient will attend all scheduled medical appointments and have stable transportation to each visit  THN CM Short Term Goal #1 Start Date  08/08/16  [facility] CM Short Term Goal #1 Met Date  09/10/16  Interventions for Short Term Goal #1  Goal met. Brother [name] took her to appointment in [place].  THN CM Short Term Goal #2 (0-30 days)  Patient will increase socialization within 30 days due to isolation.  THN CM Short Term Goal #2 Start Date  09/10/16  [facility] CM Short Term Goal #2 Met Date  10/24/16  Interventions for Short Term Goal #2  Goal met. Patient has attended church events and socialized with family members.  THN CM Short Term Goal #3 (0-30 days)  Patient will start [facility] services within 30 days as evidenced by patient report  THN CM Short Term Goal #3 Start Date  10/24/16  [facility] CM Short Term Goal #3 Met Date  11/20/16  Interventions for Short Tern Goal #3  Goal met.  THN CM Short Term Goal #4 (0-30 days)  Patient will strongly consider going to a senior center in order to gain socialization within 30 days as evidenced by patient  report  THN CM Short Term Goal #4 Start Date  11/20/16  [facility] CM Short Term Goal #4 Met Date  01/02/17  Interventions for Short Term Goal #4  Patient is at [facility] [name] for short term rehab but has been participating in activities at facility.  THN CM Short Term Goal #5 (0-30 days)  Patient will make follow up appointments with psychiatrist and PCP within 30 days after leaving [place] per patient or family report  [facility] CM Short Term Goal #5 Start Date  11/20/16  [facility] CM Short Term Goal #5 Met Date  12/10/16  Interventions for Short Term Goal  #5  Goal met. Patient has attended all needed appointments.    [facility] CM Care Plan Problem Two     Most Recent Value  Care Plan Problem Two  [name] admission as well as [facility] inpatient behavioral health admission.  Role Documenting the Problem Two  Clinical Social Worker  Care Plan for Problem Two  Active  Interventions for Problem Two Long Term Goal   CSW will complete [name] visits and assist patient with social work needs to ensure that she has a safe and stable discharge back home with needed services, medical equipment and resources. CSW will follow up with [employer] and [facility] to ensure that services start again. CSW will work with family, PCP, [facility] RNCM and [name] Education officer, museum.  THN Long Term Goal (31-90) days  Patient will have a safe and stable discharge back home from [name] within 90 days per patient and family report  THN Long Term Goal Start Date  11/27/16  THN CM Short Term Goal #1 (0-30 days)  Patient will implement ongoing socialization within 30 days at facility  [facility] CM Short Term Goal #1 Start Date  01/02/17  Interventions for Short Term Goal #2   CSW completed [name] visit and patient has improved her socialization by attending [place] and going to [employer] at facility. Patient shares that she is making efforts to prevent isolation.  THN CM Short Term Goal #2 (0-30 days)  Patient will attend all medical appointments within 30 days per patient report  THN CM Short Term Goal #2 Start Date  01/02/17  Interventions for Short Term Goal #2  CSW will continue to provide social work support and assistance to encourage patient to attend all scheduled appointments including both PT, Speech and PCP appointments.,     Plan-CSW will follow up within 30 days and continue to provide social work support and assist with [name] discharge.  [name], BSW, MSW, [email or web address] Phone: [phone] Fax: [phone]

## 2017-01-10 ENCOUNTER — Other Ambulatory Visit: Payer: Self-pay | Admitting: Licensed Clinical Social Worker

## 2017-01-10 NOTE — Patient Outreach (Signed)
[facility]) Care Management  01/10/2017  NA WALDRIP 1941/09/22 [id number]   Assessment- CSW completed call to [facility] and spoke to Education officer, museum. Social Worker states that there is no set discharge date at this time. She shares that there are concerns for patient safety and that her brother is unsure if she will be able to come home. She reports that patient's brother [name] is looking into LTC placement at [facility]. She shares that she will update this CSW will any further updates. She reports that patient is ambulating better but still is very confused.  Plan-CSW will continue to follow case and provide social work services while patient is at [facility]  [name], BSW, MSW, [email or web address] Phone: [phone] Fax: [phone]

## 2017-01-17 ENCOUNTER — Other Ambulatory Visit: Payer: Self-pay | Admitting: Licensed Clinical Social Worker

## 2017-01-17 NOTE — Patient Outreach (Signed)
[facility]) Care Management  01/17/2017  SKYLEIGH WINDLE 09-21-1941 [id number]  Assessment- CSW completed outreach call to [facility] [name] and left a message with [name] social worker. CSW requested return call with updates in regards to patient's expected discharge date or if patient and family are wishing to follow through with LTC placement.  Plan-CSW will await to hear back or will complete another attempt within one week.  [name], BSW, MSW, [email or web address] Phone: [phone] Fax: [phone]

## 2017-01-18 ENCOUNTER — Emergency Department (HOSPITAL_COMMUNITY): Payer: Medicare Other

## 2017-01-18 ENCOUNTER — Encounter (HOSPITAL_COMMUNITY): Payer: Self-pay | Admitting: *Deleted

## 2017-01-18 ENCOUNTER — Emergency Department (HOSPITAL_COMMUNITY)
Admission: EM | Admit: 2017-01-18 | Discharge: 2017-01-18 | Disposition: A | Discharge status: Home / Self Care | Payer: Medicare Other | Attending: Emergency Medicine | Admitting: Emergency Medicine

## 2017-01-18 DIAGNOSIS — I5023 Acute on chronic systolic (congestive) heart failure: Secondary | ICD-10-CM | POA: Diagnosis not present

## 2017-01-18 DIAGNOSIS — G20 Parkinson's disease: Secondary | ICD-10-CM | POA: Insufficient documentation

## 2017-01-18 DIAGNOSIS — S7001XA Contusion of right hip, initial encounter: Secondary | ICD-10-CM | POA: Diagnosis not present

## 2017-01-18 DIAGNOSIS — Y939 Activity, unspecified: Secondary | ICD-10-CM | POA: Insufficient documentation

## 2017-01-18 DIAGNOSIS — Z7982 Long term (current) use of aspirin: Secondary | ICD-10-CM | POA: Insufficient documentation

## 2017-01-18 DIAGNOSIS — I110 Hypertensive heart disease with heart failure: Secondary | ICD-10-CM | POA: Insufficient documentation

## 2017-01-18 DIAGNOSIS — Z8673 Personal history of transient ischemic attack (TIA), and cerebral infarction without residual deficits: Secondary | ICD-10-CM | POA: Diagnosis not present

## 2017-01-18 DIAGNOSIS — Y999 Unspecified external cause status: Secondary | ICD-10-CM | POA: Insufficient documentation

## 2017-01-18 DIAGNOSIS — Y929 Unspecified place or not applicable: Secondary | ICD-10-CM | POA: Insufficient documentation

## 2017-01-18 DIAGNOSIS — X58XXXA Exposure to other specified factors, initial encounter: Secondary | ICD-10-CM | POA: Insufficient documentation

## 2017-01-18 DIAGNOSIS — E039 Hypothyroidism, unspecified: Secondary | ICD-10-CM | POA: Diagnosis not present

## 2017-01-18 NOTE — ED Triage Notes (Signed)
EMS states pt is from [facility], stafff reports pt did not have rt hip bruising yesterday, today around 1100 noted large bruising on rt hip area, unknown cause. PMS good able to move leg and foot

## 2017-01-18 NOTE — ED Notes (Signed)
Patient transported to X-ray 

## 2017-01-18 NOTE — ED Notes (Signed)
Writer spoke with William Dalton nurse at [facility] who cared for pt yesterday until 4ish. No noted hip bruising, returned this am about 0845, pt sitting in chair with noted bruise. Pt denies knowing what happened. No reported incidents with pt from staff. Pt normally does not express pain but has verbalized pain upon arrival to hospital this am. Pt did receive x-ray this am of hip without results back at present time. Dr Zenia Resides updated.

## 2017-01-18 NOTE — ED Provider Notes (Signed)
[facility] DEPT Provider Note   CSN: [id number] Arrival date & time: 01/18/17  1121     History   Chief Complaint Chief Complaint  Patient presents with  . Hip Injury    HPI [name] is a 76 y.o. female.  76 year old female with history of dementia who presents from nursing home after being found with a large bruise on her right hip. Patient is normally not alert to person and place. She does not take any blood thinners. The bruising extends from her right hip across to her mid gluteal region. She has no shortening or rotation of the lower extremity. No other signs of trauma noted. Family states that her mental status is at baseline. Was sent here for further evaluation. No treatment use prior to arrival.      Past Medical History:  Diagnosis Date  . Acute on chronic systolic heart failure ([place])   . Anemia   . Anxiety   . Arthritis   . Cataract left  . Cellulitis and abscess of leg, except foot   . Cellulitis of lower leg 12/24/2011  . CHF (congestive heart failure) ([place])   . Chronic bronchitis ([place])    "usually get it q yr"  . Chronic systolic heart failure ([place])   . GERD (gastroesophageal reflux disease)   . Glaucoma left  . Helicobacter pylori (H. pylori)   . History of bleeding peptic ulcer   . History of blood transfusion    "don't remember why"  . Hypercholesterolemia   . Hypertension   . Hypotension, unspecified   . Hypothyroidism   . Parkinson's disease   . Pneumonia   . Sleep apnea    "had test years ago; insurance wouldn't pay for mask so I never had one" (04/27/2015)  . Stroke [facility]) X 3   "that  was the reason I got Parkinson's"    Patient Active Problem List   Diagnosis Date Noted  . 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 Anxiety state 06/18/2016  . 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 Syncope 06/10/2016  . Fall 06/10/2016  . Near syncope   . Altered mental state   . Chronic systolic congestive  heart failure ([place])   . CHF exacerbation ([place]) 05/25/2016  . Pneumonia 05/25/2016  . Bilateral cellulitis of lower leg 03/11/2016  . Venous stasis dermatitis of both lower extremities 03/11/2016  . Melena 02/15/2016  . Right lower quadrant abdominal swelling, mass and lump 02/15/2016  . Acute delirium 04/27/2015  . Acute encephalopathy 04/27/2015  . UTI (urinary tract infection): Probable 04/27/2015  . Debility   . Dyskinesia 04/24/2015  . Cellulitis of left leg 04/23/2015  . Failure to thrive in adult 04/23/2015  . Chronic low back pain 01/18/2015  . Lower extremity pain 12/07/2014  . Cellulitis of left lower extremity   . Nausea vomiting and diarrhea 08/07/2014  . Enteritis 08/07/2014  . 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 Chronic constipation 02/11/2014  . Primary freezing of gait 02/11/2014  . Abnormality of gait 01/28/2014  . Physical deconditioning 10/05/2013  . Thrombocytopenia, unspecified ([place]) 09/30/2013  . Rectal bleeding 09/29/2013  . UGI bleed 09/29/2013  . Anemia due to blood loss, acute 09/29/2013  . Anemia, iron deficiency 04/09/2013  . Cameron lesion, acute 01/08/2012  . Acute urinary retention, resolved after foley and no re-occurrence, 12/11/11  12/24/2011  . Cellulitis of lower leg 12/24/2011  . Diastolic dysfunction, grade 2 12/15/2011  .  CAP (community acquired pneumonia)  12/15/2011  . Abnormal EKG,(TWI lead 3 on one of 7 EKGs this admission) 12/12/2011  . Chest pain, MI R/O, negative MI- secondary to CAP 12/10/11 12/10/2011  . Cor pulmonale ([place]) 12/10/2011  . Syncope and collapse, pt fell and did not know why she fell. 12/10/2011  . Hematemesis 09/26/2011  . Acute blood loss anemia 09/26/2011  . Hypotension 09/26/2011  . Leucocytosis 09/26/2011  . Aspiration of blood 09/26/2011  . Parkinson disease ([place]) 09/26/2011  . Essential hypertension 09/26/2011  . GI bleed: January 2013, Hgb stable, stool negative  09/26/2011  . Gastric ulcer with hemorrhage 09/26/2011  . Esophagitis, erosive 09/26/2011    Past Surgical History:  Procedure Laterality Date  . BACK SURGERY    . ESOPHAGOGASTRODUODENOSCOPY  09/26/2011   Procedure: ESOPHAGOGASTRODUODENOSCOPY (EGD);  Surgeon: Zenovia Jarred, MD;  Location: [facility] ENDOSCOPY;  Service: Gastroenterology;  Laterality: N/A;  To be done at bedside.  . ESOPHAGOGASTRODUODENOSCOPY N/A 09/30/2013   Procedure: ESOPHAGOGASTRODUODENOSCOPY (EGD);  Surgeon: [name], MD;  Location: [facility];  Service: Endoscopy;  Laterality: N/A;  . ESOPHAGOGASTRODUODENOSCOPY N/A 11/25/2013   Procedure: ESOPHAGOGASTRODUODENOSCOPY (EGD);  Surgeon: [name], MD;  Location: [facility] ENDOSCOPY;  Service: Endoscopy;  Laterality: N/A;  . HEMIARTHROPLASTY HIP Right 09/24/2007   Archie Endo 01/23/2011  . KYPHOPLASTY  06/2009   T12/notes 07/17/2009  . NISSEN FUNDOPLICATION     "had OR for GERD"  . TENDON REPAIR     Gluteus medius Archie Endo 01/23/2011  . THYROIDECTOMY    . TONSILLECTOMY      OB History    No data available       Home Medications    Prior to Admission medications   Medication Sig Start Date End Date Taking? Authorizing Provider  aspirin EC 81 MG tablet Take 81 mg by mouth daily.    Historical Provider, MD  carbidopa-levodopa (SINEMET IR) 25-100 MG tablet Take 1.5 tablets by mouth 3 (three) times daily. 9am, 3pm, 9pm    Historical Provider, MD  docusate sodium (COLACE) 100 MG capsule Take 100 mg by mouth daily. Reported on 02/28/2016    Historical Provider, MD  ferrous sulfate 325 (65 FE) MG tablet Take 325 mg by mouth daily. 10/22/16   Historical Provider, MD  furosemide (LASIX) 20 MG tablet Take 20 mg by mouth daily.  11/21/16 11/21/17  Historical Provider, MD  latanoprost (XALATAN) 0.005 % ophthalmic solution Place 1 drop into the left eye daily at 6 PM.  02/23/15   Historical Provider, MD  lisinopril (PRINIVIL,ZESTRIL) 2.5 MG tablet Take by mouth. 11/21/16 11/21/17  Historical Provider, MD    LORazepam (ATIVAN) 0.5 MG tablet Take 0.5 mg by mouth 2 (two) times daily as needed.    Historical Provider, MD  metoprolol tartrate (LOPRESSOR) 25 MG tablet Take 12.5 mg by mouth 2 (two) times daily.  11/21/16 11/21/17  Historical Provider, MD  Multiple Vitamins-Minerals (MULTIVITAMIN WITH MINERALS) tablet Take 1 tablet by mouth daily.    Historical Provider, MD  nystatin cream (MYCOSTATIN) Apply 1 application topically 2 (two) times daily.    Historical Provider, MD  OLANZapine (ZYPREXA) 5 MG tablet Take by mouth. 11/21/16 12/21/16  Historical Provider, MD  Pimavanserin Tartrate (NUPLAZID) 17 MG TABS Take 2 tablets by mouth daily. 11/01/16   [name] L Reed, DO  Polyethyl Glycol-Propyl Glycol (SYSTANE OP) Place 1 drop into both eyes daily.     Historical Provider, MD  pramipexole (MIRAPEX) 1 MG tablet Take 1 tablet (1 mg total) by  mouth 3 (three) times daily. 10/22/16   [name] L Reed, DO  Skin Protectants, Misc. (EUCERIN) cream Apply topically. 11/21/16 11/21/17  Historical Provider, MD  triamcinolone cream (KENALOG) 0.1 % Apply 1 application topically 2 (two) times daily.    Historical Provider, MD    Family History Family History  Problem Relation Age of Onset  . GER disease Mother   . Heart disease Father     Social History Social History  Substance Use Topics  . Smoking status: Never Smoker  . Smokeless tobacco: Never Used  . Alcohol use No     Allergies   Codeine and Penicillins   Review of Systems Review of Systems  Unable to perform ROS: Dementia     Physical Exam Updated Vital Signs BP (!) 160/74 (BP Location: Left Arm)   Pulse 61   Temp 97.5 F (36.4 C) (Oral)   Resp 13   SpO2 96%   Physical Exam  Constitutional: She appears well-developed and well-nourished.  Non-toxic appearance. No distress.  HENT:  Head: Normocephalic and atraumatic.  Eyes: Conjunctivae, EOM and lids are normal. Pupils are equal, round, and reactive to light.  Neck: Normal range of motion. Neck  supple. No tracheal deviation present. No thyroid mass present.  Cardiovascular: Normal rate, regular rhythm and normal heart sounds.  Exam reveals no gallop.   No murmur heard. Pulmonary/Chest: Effort normal and breath sounds normal. No stridor. No respiratory distress. She has no decreased breath sounds. She has no wheezes. She has no rhonchi. She has no rales.  Abdominal: Soft. Normal appearance and bowel sounds are normal. She exhibits no distension. There is no tenderness. There is no rebound and no CVA tenderness.  Musculoskeletal: Normal range of motion. She exhibits no edema or tenderness.       Legs: Neurological: She is alert. No cranial nerve deficit or sensory deficit. GCS eye subscore is 4. GCS verbal subscore is 5. GCS motor subscore is 6.  Skin: Skin is warm and dry. Ecchymosis noted. No rash noted.     Psychiatric: Her affect is blunt.  Nursing note and vitals reviewed.    ED Treatments / Results  Labs (all labs ordered are listed, but only abnormal results are displayed) Labs Reviewed - No data to display  EKG  EKG Interpretation None       Radiology No results found.  Procedures Procedures (including critical care time)  Medications Ordered in ED Medications - No data to display   Initial Impression / Assessment and Plan / ED Course  I have reviewed the triage vital signs and the nursing notes.  Pertinent labs & imaging results that were available during my care of the patient were reviewed by me and considered in my medical decision making (see chart for details).     X-rays negative here for fracture. Will be discharged back to nursing facility  Final Clinical Impressions(s) / ED Diagnoses   Final diagnoses:  None    New Prescriptions New Prescriptions   No medications on file     Lacretia Leigh, MD 01/18/17 1319

## 2017-01-20 ENCOUNTER — Other Ambulatory Visit: Payer: Self-pay | Admitting: Licensed Clinical Social Worker

## 2017-01-20 NOTE — Patient Outreach (Signed)
[facility]) Care Management  01/20/2017  SAMELLA LUCCHETTI 12-02-1940 [id number]   Assessment- CSW received notification that patient went to ED on 01/18/17. Patient presented to ED from [facility] after being found with a large bruise on her right hip. Patient was unsure what caused bruise. Patient was discharged back to [facility].  Plan-CSW will continue to follow case and assist with discharge planning.  [name], BSW, MSW, [email or web address] Phone: [phone] Fax: [phone]

## 2017-01-21 ENCOUNTER — Encounter (INDEPENDENT_AMBULATORY_CARE_PROVIDER_SITE_OTHER): Payer: Medicare Other | Admitting: Podiatry

## 2017-01-21 NOTE — Progress Notes (Signed)
This encounter was created in error - please disregard.

## 2017-01-22 DIAGNOSIS — Z66 Do not resuscitate: Secondary | ICD-10-CM | POA: Diagnosis not present

## 2017-01-22 DIAGNOSIS — I10 Essential (primary) hypertension: Secondary | ICD-10-CM | POA: Diagnosis not present

## 2017-01-22 DIAGNOSIS — E039 Hypothyroidism, unspecified: Secondary | ICD-10-CM | POA: Diagnosis not present

## 2017-01-22 DIAGNOSIS — F29 Unspecified psychosis not due to a substance or known physiological condition: Secondary | ICD-10-CM | POA: Diagnosis not present

## 2017-01-22 DIAGNOSIS — G20 Parkinson's disease: Secondary | ICD-10-CM | POA: Diagnosis not present

## 2017-01-31 ENCOUNTER — Other Ambulatory Visit: Payer: Self-pay | Admitting: Licensed Clinical Social Worker

## 2017-01-31 NOTE — Patient Outreach (Signed)
[facility]) Care Management  01/31/2017  AYSEL GILCHREST [birth year] [id number]  Assessment- CSW completed outreach call to [name] and left message with [name] social worker requesting a return call with updates in regards to patient's discharge plan.  CSW completed call to patient's brother but was unable to reach him successfully. CSW left a HIPPA compliant voice message and requested a return call once he is available.   Plan-CSW will follow up within one week and continue to provide social work assistance and support.  [name], BSW, MSW, [email or web address] Phone: [phone] Fax: [phone]

## 2017-01-31 NOTE — Patient Outreach (Signed)
[facility]) Care Management  01/31/2017  [name] 07/15/1941 [id number]  Assessment- CSW received incoming call from patient's brother [name]. He reports that he was informed that patient will be discharging from [name] by the middle of June of 2018. He reports that patient will be residing with him because he knows she is unsafe to live alone. CSW thanked him for this update and informed him that she will continue to follow patient.  Plan-CSW will continue to provide social work support and will follow up within one week.  [name], BSW, MSW, [name] Phone: [phone] Fax: [phone]

## 2017-02-05 ENCOUNTER — Other Ambulatory Visit: Payer: Self-pay | Admitting: Licensed Clinical Social Worker

## 2017-02-05 NOTE — Patient Outreach (Signed)
[facility]) Care Management  [facility] Social Work  02/05/2017  [name] 1941/02/17 [id number]  Encounter Medications:  Outpatient Encounter Prescriptions as of 02/05/2017  Medication Sig  . aspirin EC 81 MG tablet Take 81 mg by mouth daily.  . carbidopa-levodopa (SINEMET IR) 25-100 MG tablet Take 1.5 tablets by mouth 3 (three) times daily. 9am, 3pm, 9pm  . docusate sodium (COLACE) 100 MG capsule Take 100 mg by mouth daily. Reported on 02/28/2016  . ferrous sulfate 325 (65 FE) MG tablet Take 325 mg by mouth daily.  . furosemide (LASIX) 20 MG tablet Take 20 mg by mouth daily.   Marland Kitchen latanoprost (XALATAN) 0.005 % ophthalmic solution Place 1 drop into the left eye daily at 6 PM.   . lisinopril (PRINIVIL,ZESTRIL) 2.5 MG tablet Take by mouth.  Marland Kitchen LORazepam (ATIVAN) 0.5 MG tablet Take 0.5 mg by mouth 2 (two) times daily as needed.  . metoprolol tartrate (LOPRESSOR) 25 MG tablet Take 12.5 mg by mouth 2 (two) times daily.   . Multiple Vitamins-Minerals (MULTIVITAMIN WITH MINERALS) tablet Take 1 tablet by mouth daily.  Marland Kitchen nystatin cream (MYCOSTATIN) Apply 1 application topically 2 (two) times daily.  Marland Kitchen OLANZapine (ZYPREXA) 5 MG tablet Take by mouth.  Marland Kitchen Pimavanserin Tartrate (NUPLAZID) 17 MG TABS Take 2 tablets by mouth daily.  Vladimir Faster Glycol-Propyl Glycol (SYSTANE OP) Place 1 drop into both eyes daily.   . pramipexole (MIRAPEX) 1 MG tablet Take 1 tablet (1 mg total) by mouth 3 (three) times daily.  . Skin Protectants, Misc. (EUCERIN) cream Apply topically.  . triamcinolone cream (KENALOG) 0.1 % Apply 1 application topically 2 (two) times daily.   No facility-administered encounter medications on file as of 02/05/2017.     Functional Status:  In your present state of health, do you have any difficulty performing the following activities: 11/05/2016 10/15/2016  Hearing? N -  Vision? N -  Difficulty concentrating or making decisions? - -  Walking or climbing stairs? Y -  Dressing  or bathing? Y -  Doing errands, shopping? - N  Conservation officer, nature and eating ? - -  Using the Toilet? - -  In the past six months, have you accidently leaked urine? - -  Do you have problems with loss of bowel control? - -  Managing your Medications? - -  Managing your Finances? - -  Housekeeping or managing your Housekeeping? - -  Some recent data might be hidden    Fall/Depression Screening:  PHQ 2/9 Scores 11/28/2016 11/25/2016 10/24/2016 10/18/2016 09/10/2016 08/23/2016 08/12/2016  PHQ - 2 Score 0 0 0 0 0 0 0    Assessment: CSW arrived at [facility] in order to complete [name] visit on 02/05/17. CSW was unable to speak to [name] social worker but contacted her office and left a HIPPA compliant voice message encouraging a return call with updates in regards to expected discharge date. Family has reported that patient will be discharging back home within a month. CSW will await to hear back with updates. Patient was not in her room or in the cafeteria area but CSW was able to find her in physical therapy. PT informed CSW it would be fine for CSW to sit beside her while she was doing her exercises. Patient reports that her appetite continues to be "too good." She shares that her favorite place is going to physical therapy because she likes to the staff members and completing exercises. Patient denies experiencing any hallucinations or depressive symptoms  but did experience some delusions while CSW was completing [name] visit. Patient denies experiencing any pain symptoms. She shares that her sleeping habits have improved because the physician at [facility] prescribed her a new medication and she is able to "sleep throughout the night without getting up." Patient is very cheerful during home visit. CSW informed patient that they are planning for an eventual discharge back home for her. She shares that she does not wish to stay at [name] long term and prefers to go back home but understands she is not able to do so and  will need to reside with her brother. She shares that she is hopeful that this transition will be "a good change." Patient asked about [facility] RNCM and wanted to tell her hello.   THN CM Care Plan Problem One     Most Recent Value  Care Plan Problem One  Need for ongoing support in the home since [name] admission  Role Documenting the Problem One  Clinical Social Worker  Care Plan for Problem One  Active  THN Long Term Goal (31-90 days)  Patient will utilize her aide, support network, community resources and coping methods within 90 days to improve overall health and well being  [facility] Long Term Goal Start Date  08/08/16  [facility] Long Term Goal Met Date  10/24/16  Interventions for Problem One Long Term Goal  Goal met.  THN CM Short Term Goal #1 (0-30 days)  Over the next 30 days, patient will attend all scheduled medical appointments and have stable transportation to each visit  THN CM Short Term Goal #1 Start Date  08/08/16  [facility] CM Short Term Goal #1 Met Date  09/10/16  Interventions for Short Term Goal #1  Goal met. Brother [name] took her to appointment in [place].  THN CM Short Term Goal #2 (0-30 days)  Patient will increase socialization within 30 days due to isolation.  THN CM Short Term Goal #2 Start Date  09/10/16  [facility] CM Short Term Goal #2 Met Date  10/24/16  Interventions for Short Term Goal #2  Goal met. Patient has attended church events and socialized with family members.  THN CM Short Term Goal #3 (0-30 days)  Patient will start [facility] services within 30 days as evidenced by patient report  THN CM Short Term Goal #3 Start Date  10/24/16  [facility] CM Short Term Goal #3 Met Date  11/20/16  Interventions for Short Tern Goal #3  Goal met.  THN CM Short Term Goal #4 (0-30 days)  Patient will strongly consider going to a senior center in order to gain socialization within 30 days as evidenced by patient report  THN CM Short Term Goal #4 Start Date  11/20/16  [facility] CM Short Term Goal #4 Met Date   01/02/17  Interventions for Short Term Goal #4  Patient is at [facility] [name] for short term rehab but has been participating in activities at facility.  THN CM Short Term Goal #5 (0-30 days)  Patient will make follow up appointments with psychiatrist and PCP within 30 days after leaving [place] per patient or family report  [facility] CM Short Term Goal #5 Start Date  11/20/16  [facility] CM Short Term Goal #5 Met Date  12/10/16  Interventions for Short Term Goal #5  Goal met. Patient has attended all needed appointments.    [facility] CM Care Plan Problem Two     Most Recent Value  Care Plan  Problem Two  [name] admission as well as [facility] inpatient behavioral health admission.  Role Documenting the Problem Two  Clinical Social Worker  Care Plan for Problem Two  Active  Interventions for Problem Two Long Term Goal   CSW will complete [name] visits and assist patient with social work needs to ensure that she has a safe and stable discharge back home with needed services, medical equipment and resources. CSW will follow up with [employer] and [facility] to ensure that services start again. CSW will work with family, PCP, [facility] RNCM and [name] Education officer, museum.  THN Long Term Goal (31-90) days  Patient will have a safe and stable discharge back home from [name] within 90 days per patient and family report  THN Long Term Goal Start Date  11/27/16  THN CM Short Term Goal #1 (0-30 days)  Patient will implement ongoing socialization within 30 days at facility  [facility] CM Short Term Goal #1 Start Date  02/05/17  Interventions for Short Term Goal #2   Patient wishes for goal to be ongoing.  THN CM Short Term Goal #2 (0-30 days)  Patient will attend all medical appointments within 30 days per patient report  [facility] CM Short Term Goal #2 Start Date  01/02/17  [facility] CM Short Term Goal #2 Met Date  02/05/17  Interventions for Short Term Goal #2  Goal met     Plan: CSW will follow up with facility  within two weeks and continue to provide social work assistance and support.  [name], BSW, MSW, [email or web address] Phone: [phone] Fax: [phone]

## 2017-02-20 ENCOUNTER — Other Ambulatory Visit: Payer: Self-pay | Admitting: Licensed Clinical Social Worker

## 2017-02-20 NOTE — Patient Outreach (Signed)
[facility]) Care Management  02/20/2017  [name] [birth year] [id number]  Assessment- CSW arrived at [facility] [name] in order to complete [name] visit. However, CSW was informed that patient has already discharged from facility. CSW was unable to meet with [name] social worker as well. CSW completed call to patient's brother [name] but was unable to reach him. CSW left a HIPPA compliant voice message encouraging return call with updates. CSW sent in basket message to [place] and updated her that patient had discharged from [facility].   Plan-CSW will await to hear back from patient's brother or complete additional outreach within one week.  [name], BSW, MSW, [email or web address] Phone: [phone] Fax: [phone]

## 2017-02-21 ENCOUNTER — Other Ambulatory Visit: Payer: Self-pay | Admitting: *Deleted

## 2017-02-21 ENCOUNTER — Encounter: Payer: Self-pay | Admitting: *Deleted

## 2017-02-21 ENCOUNTER — Other Ambulatory Visit: Payer: Self-pay | Admitting: Licensed Clinical Social Worker

## 2017-02-21 MED ORDER — METOPROLOL TARTRATE 25 MG PO TABS: 12.5000 mg | ORAL_TABLET | Freq: Two times a day (BID) | ORAL | 3 refills | Status: AC

## 2017-02-21 NOTE — Patient Outreach (Signed)
[facility]) Care Management  02/21/2017  [name] [birth year] [id number]  Assessment- CSW received incoming call from [place]. CSW was informed that patient is NOT residing with brother [name] but is staying at her own residence and [name] is staying with patient and providing care. However, this is only a temporary situation. CSW and RNCM scheduled for a joint home visit on 02/25/17 to discuss the possibility of LTC placement as we do not feel that patient will be safe to continue staying at home. Patient's brother [name] will be present during home visit.  Plan-CSW will complete home visit next week.  [name], BSW, MSW, [email or web address] Phone: [phone] Fax: [phone]

## 2017-02-21 NOTE — Telephone Encounter (Signed)
Brother stopped by office and stated that patient needed a refill on her Lopressor because facility did not give her one at time of Discharge. I spoke with Dr. Mariea Clonts and she oked the Refill. Patient has an appointment on 6/11 with Dr. Mariea Clonts.

## 2017-02-21 NOTE — Patient Outreach (Signed)
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  Stroh most recently admitted to [facility] February 13- November 21, 2016, under IVC in [place], [place] following ED visit 11/05/16 for hallucinations, agitation, and delusions related to chronic disease state of Parkinson's Disease, and was discharged to [name] at [facility].  Was notified by [facility] CSW 02/20/17 that patient had discharged from [name], and had previous plans to live with her brother at the time of [name] discharge.  [name] by brother [name] today during phone call.   Today, [name] reports that patient "is doing pretty good, with good moments and bad moments" since her discharge home from [name] on Tuesday 02/18/17.  [name] further reports:  -- patient is NOT living with him; patient discharged from [name] and stayed at his home for "several nights," where she was "disruptive at times," and causing the household to "stay up all hours of the night."  Patient is now living back at her home, with [name] "checking in frequently."  -- [name] continues taking care of his wife at his own home, who requires 24/7 care  -- Rachyl has continued having significant episodes of hallucinating since discharge home from [name]; today, [name] provided numerous examples of Zaidee's hallucinations since her discharge from [name]  -- [name]'s 77 y/o adult daughter, Alyse Low, "who has been through some hard times," is now living with and attempting to take care of Sayge at patient's home; [name] reports "this is  most likely temporary."  Confirms that Alyse Low has not had any specific training in caring for patient.  -- [facility]) agency involved in patient's care post-[name] discharge; "someone from [facility] came to see her on Wednesday 02/19/17."  -- [name] "thinks" he "finally" has her medications "straightened out;"  Reports that he visited patient's PCP today to obtain current list of medications and is in process of "picking them all up" from the pharmacy.  [name] is unable to complete medication reconciliation at time of phone call, stating that he is not near the list, and has not obtained all of her medications.  [name] admits confusion around patient's current medication regimen, but states, "I think I'll be able to figure it out once I get them all and compare them to the list the doctor gave me."   [name] verbalizes plans to fill weekly pill box for [facility] once he obtains all of patient's medications.  -- No new falls since [name] discharge; [name] acknowledges that patient is a high fall risk, and general fall risks/ prevention education was provided and discussed today.  [name] states that "is not sure" if his daughter Alyse Low who is now living with patient at patient's home "would know what to do" if patient fell.  Patient continues using rolling walker at all times.  -- Level of care needs:  [name] shares that he now has durable POA over patient, in addition to [facility]; [name] acknowledges that he realizes he may be unable to manage care of both his wife and of patient, as both require 24-7 supervision.  [name] is obviously overwhelmed during today's phone call, and we had a  long discussion about Malyna's care/ safety needs and his ability to provide, given his wife's condition.  [name] is willing to discuss long-term placement options to make sure that patient stays safe and has the care she needs, although he acknowledges that patient "will not like it."  Emotional support provided, and confirmed for [name] that  Jyll would best be served by constant supervision for safety reasons.  -- [name] states that he is aware of patient's upcoming provider appointments, but is not near his calendar to confirm.  Reports he will transport patient to all provider appointments.  Today we scheduled initial home visit for next week, and [name] agreed to be present for initial home visit, asking that I call him when I am 30-minutes out from patient's home.  [name] denies further issues, concerns, or problems today.  I confirmed that [name] has my direct phone number, the main [facility] CM office phone number, and the [facility] CM 24-hour nurse advice phone number should issues arise prior to next scheduled [place] outreach.  I then contacted [facility] CSW [name], who is actively involved in patient's care, to update her, and we planned joint home visit for next week with patient and with [name].  Plan:  Initial home visit Tuesday February 25, 2017 with [facility] CSW and patient's caregiver/ brother [name]  [name], RN, BSN, [place] Care Management  [phone]

## 2017-02-25 ENCOUNTER — Other Ambulatory Visit: Payer: Self-pay | Admitting: *Deleted

## 2017-02-25 ENCOUNTER — Other Ambulatory Visit: Payer: Self-pay | Admitting: Licensed Clinical Social Worker

## 2017-02-25 DIAGNOSIS — R279 Unspecified lack of coordination: Secondary | ICD-10-CM | POA: Diagnosis not present

## 2017-02-25 DIAGNOSIS — R278 Other lack of coordination: Secondary | ICD-10-CM | POA: Diagnosis not present

## 2017-02-25 DIAGNOSIS — R41841 Cognitive communication deficit: Secondary | ICD-10-CM | POA: Diagnosis not present

## 2017-02-25 DIAGNOSIS — R2689 Other abnormalities of gait and mobility: Secondary | ICD-10-CM | POA: Diagnosis not present

## 2017-02-25 DIAGNOSIS — G20 Parkinson's disease: Secondary | ICD-10-CM | POA: Diagnosis not present

## 2017-02-25 DIAGNOSIS — R293 Abnormal posture: Secondary | ICD-10-CM | POA: Diagnosis not present

## 2017-02-25 NOTE — Patient Outreach (Signed)
[facility]) Care Management  [facility] Social Work  02/25/2017  JULIYA MAGILL 1941/06/11 [id number]  Encounter Medications:  Outpatient Encounter Prescriptions as of 02/25/2017  Medication Sig  . aspirin EC 81 MG tablet Take 81 mg by mouth daily.  . carbidopa-levodopa (SINEMET IR) 25-100 MG tablet Take 1.5 tablets by mouth 3 (three) times daily. 9am, 3pm, 9pm  . docusate sodium (COLACE) 100 MG capsule Take 100 mg by mouth daily. Reported on 02/28/2016  . ferrous sulfate 325 (65 FE) MG tablet Take 325 mg by mouth daily.  . furosemide (LASIX) 20 MG tablet Take 20 mg by mouth daily.   Marland Kitchen latanoprost (XALATAN) 0.005 % ophthalmic solution Place 1 drop into the left eye daily at 6 PM.   . lisinopril (PRINIVIL,ZESTRIL) 2.5 MG tablet Take by mouth.  Marland Kitchen LORazepam (ATIVAN) 0.5 MG tablet Take 0.5 mg by mouth 2 (two) times daily as needed.  . metoprolol tartrate (LOPRESSOR) 25 MG tablet Take 0.5 tablets (12.5 mg total) by mouth 2 (two) times daily.  . Multiple Vitamins-Minerals (MULTIVITAMIN WITH MINERALS) tablet Take 1 tablet by mouth daily.  Marland Kitchen nystatin cream (MYCOSTATIN) Apply 1 application topically 2 (two) times daily.  Marland Kitchen OLANZapine (ZYPREXA) 5 MG tablet Take by mouth.  Marland Kitchen Pimavanserin Tartrate (NUPLAZID) 17 MG TABS Take 2 tablets by mouth daily.  Vladimir Faster Glycol-Propyl Glycol (SYSTANE OP) Place 1 drop into both eyes daily.   . pramipexole (MIRAPEX) 1 MG tablet Take 1 tablet (1 mg total) by mouth 3 (three) times daily.  . Skin Protectants, Misc. (EUCERIN) cream Apply topically.  . triamcinolone cream (KENALOG) 0.1 % Apply 1 application topically 2 (two) times daily.   No facility-administered encounter medications on file as of 02/25/2017.     Functional Status:  In your present state of health, do you have any difficulty performing the following activities: 11/05/2016 10/15/2016  Hearing? N -  Vision? N -  Difficulty concentrating or making decisions? - -  Walking or climbing  stairs? Y -  Dressing or bathing? Y -  Doing errands, shopping? - N  Conservation officer, nature and eating ? - -  Using the Toilet? - -  In the past six months, have you accidently leaked urine? - -  Do you have problems with loss of bowel control? - -  Managing your Medications? - -  Managing your Finances? - -  Housekeeping or managing your Housekeeping? - -  Some recent data might be hidden    Fall/Depression Screening:  PHQ 2/9 Scores 11/28/2016 11/25/2016 10/24/2016 10/18/2016 09/10/2016 08/23/2016 08/12/2016  PHQ - 2 Score 0 0 0 0 0 0 0    Assessment: CSW was on the way to complete home visit at patient's residence before receiving notification from [place] not to go to residence as patient was not there. CSW received incoming call from [place] later. She spoke to patient's brother [name] and was informed that patient had an erratic episode as well as hallucinations and brother decided to place patient at [place] facility long term because family felt that this was the best choice for patient and her safety. CSW and [facility] RNCM will complete case closure at this time. However, CSW wanted to visit patient at [facility] before completing discharge. Patient was in her room doing a word search puzzle when CSW arrived. Patient is experiencing hallucinations currently and informed CSW that she found out today that she is pregnant but that her husband does not want her  to keep the baby. CSW provided emotional support and comfort to patient. Patient reports that she is happy to be back at [facility] and is going to attend one of the facility's activities today. She shares that she is experiencing pain on her lower back. She denies that this has been an ongoing issue for her. Patient agrees that her sleeping habits continue to be "better" and confirms that her appetite "is the same." She continues to go to a private room to receive her lunch so that staff can assist her with eating. Patient's  physical therapist arrived during visit. He reports that he is familiar with patient and feels that the decision for her to be placed her long term is the right choice. He reports "She was able to get use to our routine here." He reports that several staff members will continue to check on patient daily. He shares that her walking has improved but that patient has low endurance and therefore wishes to stay seated most of the time. Patient reports that she loves receiving physical, occupational and speech therapy. CSW informed patient that this will be her last visit as it was decided that she will be at facility long term. Patient expressed understanding. CSW wished her well and is agreeable to case closure at this time.   [facility] CM Care Plan Problem One     Most Recent Value  Care Plan Problem One  Need for ongoing support in the home since [name] admission  Role Documenting the Problem One  Clinical Social Worker  Care Plan for Problem One  Active  [facility] Long Term Goal   Patient will utilize her aide, support network, community resources and coping methods within 90 days to improve overall health and well being  [facility] Long Term Goal Start Date  08/08/16  [facility] Long Term Goal Met Date  10/24/16  Interventions for Problem One Long Term Goal  Goal met.  [facility] CM Short Term Goal #1   Over the next 30 days, patient will attend all scheduled medical appointments and have stable transportation to each visit  [facility] CM Short Term Goal #1 Start Date  08/08/16  [facility] CM Short Term Goal #1 Met Date  09/10/16  Interventions for Short Term Goal #1  Goal met. Brother [name] took her to appointment in [place].  [facility] CM Short Term Goal #2   Patient will increase socialization within 30 days due to isolation.  [facility] CM Short Term Goal #2 Start Date  09/10/16  [facility] CM Short Term Goal #2 Met Date  10/24/16  Interventions for Short Term Goal #2  Goal met. Patient has attended church events and socialized with family members.  [facility]N CM  Short Term Goal #3  Patient will start CHRP services within 30 days as evidenced by patient report  [facility] CM Short Term Goal #3 Start Date  10/24/16  [facility] CM Short Term Goal #3 Met Date  11/20/16  Interventions for Short Tern Goal #3  Goal met.  THN CM Short Term Goal #4  Patient will strongly consider going to a senior center in order to gain socialization within 30 days as evidenced by patient report  [facility] CM Short Term Goal #4 Start Date  11/20/16  [facility] CM Short Term Goal #4 Met Date  01/02/17  Interventions for Short Term Goal #4  Patient is at [facility] [name] for short term rehab but has been participating in activities at facility.  [facility] CM Short Term Goal #  5   Patient will make follow up appointments with psychiatrist and PCP within 30 days after leaving [place] per patient or family report  [facility] CM Short Term Goal #5 Start Date  11/20/16  [facility] CM Short Term Goal #5 Met Date  12/10/16  Interventions for Short Term Goal #5  Goal met. Patient has attended all needed appointments.    [facility] CM Care Plan Problem Two     Most Recent Value  Care Plan Problem Two  [name] admission as well as [facility] inpatient behavioral health admission.  Role Documenting the Problem Two  Clinical Social Worker  Care Plan for Problem Two  Active  Interventions for Problem Two Long Term Goal   Patient will be at [facility] long term now.  [facility]N Long Term Goal  Patient will have a safe and stable discharge back home from [name] within 90 days per patient and family report  [facility] Long Term Goal Start Date  11/27/16  [facility] Long Term Goal Met Date  02/25/17  [facility] CM Short Term Goal #1   Patient will implement ongoing socialization within 30 days at facility  [facility] CM Short Term Goal #1 Start Date  02/05/17  [facility] CM Short Term Goal #1 Met Date   02/25/17  Interventions for Short Term Goal #2   Goal met. Patient continues to participate in activities at facility.  [facility] CM Short Term Goal  #2   Patient will attend all medical appointments within 30 days per patient report  [facility] CM Short Term Goal #2 Start Date  01/02/17  [facility] CM Short Term Goal #2 Met Date  02/05/17  Interventions for Short Term Goal #2  Goal met     Plan: CSW will complete case closure at this time. CSW will inform PCP of case closure as well.  [name], BSW, MSW, [email or web address] Phone: [phone] Fax: [phone]

## 2017-02-25 NOTE — Patient Outreach (Signed)
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  [name] most recently admitted to [name] February 13- November 21, 2016, under [name] in [place], [place] following ED visit 11/05/16 for hallucinations, agitation, and delusions related to chronic disease state of Parkinson's Disease, and was discharged to [name] at [name].  Was notified by [name] 02/20/17 that patient had discharged from [name], and had previous plans to live with her brother at the time of [name] discharge.  [name] by brother [name] today during phone call.   Today, [name] reports that patient "became very disturbed" over weekend, and "was causing disruptions" in the family; [name] reported that patient's hallucinations increased, stating "they were happening pretty much constantly," and patient "was acting out, screaming and yelling and not letting anyone near her."  [name] reports today that he tried to calm patient down, but was unsuccessful; stated that patient "kicked out" his daughter [name] who was living with patient at the time of her discharge from the [name].  [name] stated that patient agreed to go back to the [name] and also that she was agreeable to living at [name] "long-term."  [name] states that as of yesterday, patient is now at [name] in [place].  Discussed case [name] case closure with [name], given that patient is now at [name] long-term, and [name] agrees with [name] case closure, assuring me that plans are to keep patient at [name] "long-term."  Emotional support  was provided to [name] around this difficult decision.  I then placed care coordination phone call to [name], [name] to update her on [name]'s reports today/ case closure.  Plan:  Will close [name] case as patient is now residing in [name] long-term, and will notify patient's PCP of same.  [name], RN, BSN, [employer] [facility] Care Management  [phone]

## 2017-02-26 DIAGNOSIS — E039 Hypothyroidism, unspecified: Secondary | ICD-10-CM | POA: Diagnosis not present

## 2017-02-26 DIAGNOSIS — R293 Abnormal posture: Secondary | ICD-10-CM | POA: Diagnosis not present

## 2017-02-26 DIAGNOSIS — G20 Parkinson's disease: Secondary | ICD-10-CM | POA: Diagnosis not present

## 2017-02-26 DIAGNOSIS — F29 Unspecified psychosis not due to a substance or known physiological condition: Secondary | ICD-10-CM | POA: Diagnosis not present

## 2017-02-26 DIAGNOSIS — R278 Other lack of coordination: Secondary | ICD-10-CM | POA: Diagnosis not present

## 2017-02-26 DIAGNOSIS — R279 Unspecified lack of coordination: Secondary | ICD-10-CM | POA: Diagnosis not present

## 2017-02-26 DIAGNOSIS — F411 Generalized anxiety disorder: Secondary | ICD-10-CM | POA: Diagnosis not present

## 2017-02-26 DIAGNOSIS — I10 Essential (primary) hypertension: Secondary | ICD-10-CM | POA: Diagnosis not present

## 2017-02-26 DIAGNOSIS — R2689 Other abnormalities of gait and mobility: Secondary | ICD-10-CM | POA: Diagnosis not present

## 2017-02-26 DIAGNOSIS — R41841 Cognitive communication deficit: Secondary | ICD-10-CM | POA: Diagnosis not present

## 2017-02-27 DIAGNOSIS — R278 Other lack of coordination: Secondary | ICD-10-CM | POA: Diagnosis not present

## 2017-02-27 DIAGNOSIS — R2689 Other abnormalities of gait and mobility: Secondary | ICD-10-CM | POA: Diagnosis not present

## 2017-02-27 DIAGNOSIS — R41841 Cognitive communication deficit: Secondary | ICD-10-CM | POA: Diagnosis not present

## 2017-02-27 DIAGNOSIS — R279 Unspecified lack of coordination: Secondary | ICD-10-CM | POA: Diagnosis not present

## 2017-02-27 DIAGNOSIS — G20 Parkinson's disease: Secondary | ICD-10-CM | POA: Diagnosis not present

## 2017-02-27 DIAGNOSIS — R293 Abnormal posture: Secondary | ICD-10-CM | POA: Diagnosis not present

## 2017-02-28 DIAGNOSIS — R278 Other lack of coordination: Secondary | ICD-10-CM | POA: Diagnosis not present

## 2017-02-28 DIAGNOSIS — R279 Unspecified lack of coordination: Secondary | ICD-10-CM | POA: Diagnosis not present

## 2017-02-28 DIAGNOSIS — R2689 Other abnormalities of gait and mobility: Secondary | ICD-10-CM | POA: Diagnosis not present

## 2017-02-28 DIAGNOSIS — R41841 Cognitive communication deficit: Secondary | ICD-10-CM | POA: Diagnosis not present

## 2017-02-28 DIAGNOSIS — G20 Parkinson's disease: Secondary | ICD-10-CM | POA: Diagnosis not present

## 2017-02-28 DIAGNOSIS — I5021 Acute systolic (congestive) heart failure: Secondary | ICD-10-CM | POA: Diagnosis not present

## 2017-02-28 DIAGNOSIS — R293 Abnormal posture: Secondary | ICD-10-CM | POA: Diagnosis not present

## 2017-03-01 DIAGNOSIS — R279 Unspecified lack of coordination: Secondary | ICD-10-CM | POA: Diagnosis not present

## 2017-03-01 DIAGNOSIS — R278 Other lack of coordination: Secondary | ICD-10-CM | POA: Diagnosis not present

## 2017-03-01 DIAGNOSIS — R2689 Other abnormalities of gait and mobility: Secondary | ICD-10-CM | POA: Diagnosis not present

## 2017-03-01 DIAGNOSIS — R41841 Cognitive communication deficit: Secondary | ICD-10-CM | POA: Diagnosis not present

## 2017-03-01 DIAGNOSIS — G20 Parkinson's disease: Secondary | ICD-10-CM | POA: Diagnosis not present

## 2017-03-01 DIAGNOSIS — R293 Abnormal posture: Secondary | ICD-10-CM | POA: Diagnosis not present

## 2017-03-03 ENCOUNTER — Ambulatory Visit: Payer: Self-pay | Admitting: Internal Medicine

## 2017-03-03 DIAGNOSIS — R279 Unspecified lack of coordination: Secondary | ICD-10-CM | POA: Diagnosis not present

## 2017-03-03 DIAGNOSIS — R41841 Cognitive communication deficit: Secondary | ICD-10-CM | POA: Diagnosis not present

## 2017-03-03 DIAGNOSIS — R278 Other lack of coordination: Secondary | ICD-10-CM | POA: Diagnosis not present

## 2017-03-03 DIAGNOSIS — R2689 Other abnormalities of gait and mobility: Secondary | ICD-10-CM | POA: Diagnosis not present

## 2017-03-03 DIAGNOSIS — G20 Parkinson's disease: Secondary | ICD-10-CM | POA: Diagnosis not present

## 2017-03-03 DIAGNOSIS — R293 Abnormal posture: Secondary | ICD-10-CM | POA: Diagnosis not present

## 2017-03-04 DIAGNOSIS — R293 Abnormal posture: Secondary | ICD-10-CM | POA: Diagnosis not present

## 2017-03-04 DIAGNOSIS — R278 Other lack of coordination: Secondary | ICD-10-CM | POA: Diagnosis not present

## 2017-03-04 DIAGNOSIS — G20 Parkinson's disease: Secondary | ICD-10-CM | POA: Diagnosis not present

## 2017-03-04 DIAGNOSIS — R2689 Other abnormalities of gait and mobility: Secondary | ICD-10-CM | POA: Diagnosis not present

## 2017-03-04 DIAGNOSIS — R41841 Cognitive communication deficit: Secondary | ICD-10-CM | POA: Diagnosis not present

## 2017-03-04 DIAGNOSIS — R279 Unspecified lack of coordination: Secondary | ICD-10-CM | POA: Diagnosis not present

## 2017-03-05 DIAGNOSIS — R2689 Other abnormalities of gait and mobility: Secondary | ICD-10-CM | POA: Diagnosis not present

## 2017-03-05 DIAGNOSIS — G20 Parkinson's disease: Secondary | ICD-10-CM | POA: Diagnosis not present

## 2017-03-05 DIAGNOSIS — R279 Unspecified lack of coordination: Secondary | ICD-10-CM | POA: Diagnosis not present

## 2017-03-05 DIAGNOSIS — R278 Other lack of coordination: Secondary | ICD-10-CM | POA: Diagnosis not present

## 2017-03-05 DIAGNOSIS — R41841 Cognitive communication deficit: Secondary | ICD-10-CM | POA: Diagnosis not present

## 2017-03-05 DIAGNOSIS — R293 Abnormal posture: Secondary | ICD-10-CM | POA: Diagnosis not present

## 2017-03-06 DIAGNOSIS — R293 Abnormal posture: Secondary | ICD-10-CM | POA: Diagnosis not present

## 2017-03-06 DIAGNOSIS — R278 Other lack of coordination: Secondary | ICD-10-CM | POA: Diagnosis not present

## 2017-03-06 DIAGNOSIS — G20 Parkinson's disease: Secondary | ICD-10-CM | POA: Diagnosis not present

## 2017-03-06 DIAGNOSIS — R41841 Cognitive communication deficit: Secondary | ICD-10-CM | POA: Diagnosis not present

## 2017-03-06 DIAGNOSIS — R279 Unspecified lack of coordination: Secondary | ICD-10-CM | POA: Diagnosis not present

## 2017-03-06 DIAGNOSIS — R2689 Other abnormalities of gait and mobility: Secondary | ICD-10-CM | POA: Diagnosis not present

## 2017-03-07 DIAGNOSIS — R41841 Cognitive communication deficit: Secondary | ICD-10-CM | POA: Diagnosis not present

## 2017-03-07 DIAGNOSIS — R2689 Other abnormalities of gait and mobility: Secondary | ICD-10-CM | POA: Diagnosis not present

## 2017-03-07 DIAGNOSIS — R278 Other lack of coordination: Secondary | ICD-10-CM | POA: Diagnosis not present

## 2017-03-07 DIAGNOSIS — R293 Abnormal posture: Secondary | ICD-10-CM | POA: Diagnosis not present

## 2017-03-07 DIAGNOSIS — R279 Unspecified lack of coordination: Secondary | ICD-10-CM | POA: Diagnosis not present

## 2017-03-07 DIAGNOSIS — G20 Parkinson's disease: Secondary | ICD-10-CM | POA: Diagnosis not present

## 2017-03-10 DIAGNOSIS — R41841 Cognitive communication deficit: Secondary | ICD-10-CM | POA: Diagnosis not present

## 2017-03-10 DIAGNOSIS — G20 Parkinson's disease: Secondary | ICD-10-CM | POA: Diagnosis not present

## 2017-03-10 DIAGNOSIS — R293 Abnormal posture: Secondary | ICD-10-CM | POA: Diagnosis not present

## 2017-03-10 DIAGNOSIS — R2689 Other abnormalities of gait and mobility: Secondary | ICD-10-CM | POA: Diagnosis not present

## 2017-03-10 DIAGNOSIS — R278 Other lack of coordination: Secondary | ICD-10-CM | POA: Diagnosis not present

## 2017-03-10 DIAGNOSIS — R279 Unspecified lack of coordination: Secondary | ICD-10-CM | POA: Diagnosis not present

## 2017-03-11 DIAGNOSIS — R41841 Cognitive communication deficit: Secondary | ICD-10-CM | POA: Diagnosis not present

## 2017-03-11 DIAGNOSIS — R293 Abnormal posture: Secondary | ICD-10-CM | POA: Diagnosis not present

## 2017-03-11 DIAGNOSIS — G20 Parkinson's disease: Secondary | ICD-10-CM | POA: Diagnosis not present

## 2017-03-11 DIAGNOSIS — R279 Unspecified lack of coordination: Secondary | ICD-10-CM | POA: Diagnosis not present

## 2017-03-11 DIAGNOSIS — R2689 Other abnormalities of gait and mobility: Secondary | ICD-10-CM | POA: Diagnosis not present

## 2017-03-11 DIAGNOSIS — R278 Other lack of coordination: Secondary | ICD-10-CM | POA: Diagnosis not present

## 2017-03-12 DIAGNOSIS — R279 Unspecified lack of coordination: Secondary | ICD-10-CM | POA: Diagnosis not present

## 2017-03-12 DIAGNOSIS — G20 Parkinson's disease: Secondary | ICD-10-CM | POA: Diagnosis not present

## 2017-03-12 DIAGNOSIS — R278 Other lack of coordination: Secondary | ICD-10-CM | POA: Diagnosis not present

## 2017-03-12 DIAGNOSIS — R41841 Cognitive communication deficit: Secondary | ICD-10-CM | POA: Diagnosis not present

## 2017-03-12 DIAGNOSIS — R293 Abnormal posture: Secondary | ICD-10-CM | POA: Diagnosis not present

## 2017-03-12 DIAGNOSIS — R2689 Other abnormalities of gait and mobility: Secondary | ICD-10-CM | POA: Diagnosis not present

## 2017-03-13 DIAGNOSIS — R279 Unspecified lack of coordination: Secondary | ICD-10-CM | POA: Diagnosis not present

## 2017-03-13 DIAGNOSIS — R41841 Cognitive communication deficit: Secondary | ICD-10-CM | POA: Diagnosis not present

## 2017-03-13 DIAGNOSIS — R2689 Other abnormalities of gait and mobility: Secondary | ICD-10-CM | POA: Diagnosis not present

## 2017-03-13 DIAGNOSIS — R293 Abnormal posture: Secondary | ICD-10-CM | POA: Diagnosis not present

## 2017-03-13 DIAGNOSIS — R278 Other lack of coordination: Secondary | ICD-10-CM | POA: Diagnosis not present

## 2017-03-13 DIAGNOSIS — G20 Parkinson's disease: Secondary | ICD-10-CM | POA: Diagnosis not present

## 2017-03-14 DIAGNOSIS — G20 Parkinson's disease: Secondary | ICD-10-CM | POA: Diagnosis not present

## 2017-03-14 DIAGNOSIS — R2689 Other abnormalities of gait and mobility: Secondary | ICD-10-CM | POA: Diagnosis not present

## 2017-03-14 DIAGNOSIS — R293 Abnormal posture: Secondary | ICD-10-CM | POA: Diagnosis not present

## 2017-03-14 DIAGNOSIS — R278 Other lack of coordination: Secondary | ICD-10-CM | POA: Diagnosis not present

## 2017-03-14 DIAGNOSIS — R41841 Cognitive communication deficit: Secondary | ICD-10-CM | POA: Diagnosis not present

## 2017-03-14 DIAGNOSIS — R279 Unspecified lack of coordination: Secondary | ICD-10-CM | POA: Diagnosis not present

## 2017-03-16 DIAGNOSIS — R509 Fever, unspecified: Secondary | ICD-10-CM | POA: Diagnosis not present

## 2017-03-16 DIAGNOSIS — R05 Cough: Secondary | ICD-10-CM | POA: Diagnosis not present

## 2017-03-17 DIAGNOSIS — G20 Parkinson's disease: Secondary | ICD-10-CM | POA: Diagnosis not present

## 2017-03-17 DIAGNOSIS — R293 Abnormal posture: Secondary | ICD-10-CM | POA: Diagnosis not present

## 2017-03-17 DIAGNOSIS — R41841 Cognitive communication deficit: Secondary | ICD-10-CM | POA: Diagnosis not present

## 2017-03-17 DIAGNOSIS — R279 Unspecified lack of coordination: Secondary | ICD-10-CM | POA: Diagnosis not present

## 2017-03-17 DIAGNOSIS — R278 Other lack of coordination: Secondary | ICD-10-CM | POA: Diagnosis not present

## 2017-03-17 DIAGNOSIS — R2689 Other abnormalities of gait and mobility: Secondary | ICD-10-CM | POA: Diagnosis not present

## 2017-03-18 DIAGNOSIS — R41841 Cognitive communication deficit: Secondary | ICD-10-CM | POA: Diagnosis not present

## 2017-03-18 DIAGNOSIS — R2689 Other abnormalities of gait and mobility: Secondary | ICD-10-CM | POA: Diagnosis not present

## 2017-03-18 DIAGNOSIS — R279 Unspecified lack of coordination: Secondary | ICD-10-CM | POA: Diagnosis not present

## 2017-03-18 DIAGNOSIS — R293 Abnormal posture: Secondary | ICD-10-CM | POA: Diagnosis not present

## 2017-03-18 DIAGNOSIS — G20 Parkinson's disease: Secondary | ICD-10-CM | POA: Diagnosis not present

## 2017-03-18 DIAGNOSIS — R278 Other lack of coordination: Secondary | ICD-10-CM | POA: Diagnosis not present

## 2017-03-19 DIAGNOSIS — R2689 Other abnormalities of gait and mobility: Secondary | ICD-10-CM | POA: Diagnosis not present

## 2017-03-19 DIAGNOSIS — R293 Abnormal posture: Secondary | ICD-10-CM | POA: Diagnosis not present

## 2017-03-19 DIAGNOSIS — R279 Unspecified lack of coordination: Secondary | ICD-10-CM | POA: Diagnosis not present

## 2017-03-19 DIAGNOSIS — R278 Other lack of coordination: Secondary | ICD-10-CM | POA: Diagnosis not present

## 2017-03-19 DIAGNOSIS — R41841 Cognitive communication deficit: Secondary | ICD-10-CM | POA: Diagnosis not present

## 2017-03-19 DIAGNOSIS — G20 Parkinson's disease: Secondary | ICD-10-CM | POA: Diagnosis not present

## 2017-03-20 DIAGNOSIS — R278 Other lack of coordination: Secondary | ICD-10-CM | POA: Diagnosis not present

## 2017-03-20 DIAGNOSIS — R2689 Other abnormalities of gait and mobility: Secondary | ICD-10-CM | POA: Diagnosis not present

## 2017-03-20 DIAGNOSIS — R279 Unspecified lack of coordination: Secondary | ICD-10-CM | POA: Diagnosis not present

## 2017-03-20 DIAGNOSIS — R293 Abnormal posture: Secondary | ICD-10-CM | POA: Diagnosis not present

## 2017-03-20 DIAGNOSIS — R41841 Cognitive communication deficit: Secondary | ICD-10-CM | POA: Diagnosis not present

## 2017-03-20 DIAGNOSIS — G20 Parkinson's disease: Secondary | ICD-10-CM | POA: Diagnosis not present

## 2017-03-21 DIAGNOSIS — R2689 Other abnormalities of gait and mobility: Secondary | ICD-10-CM | POA: Diagnosis not present

## 2017-03-21 DIAGNOSIS — R293 Abnormal posture: Secondary | ICD-10-CM | POA: Diagnosis not present

## 2017-03-21 DIAGNOSIS — R278 Other lack of coordination: Secondary | ICD-10-CM | POA: Diagnosis not present

## 2017-03-21 DIAGNOSIS — R41841 Cognitive communication deficit: Secondary | ICD-10-CM | POA: Diagnosis not present

## 2017-03-21 DIAGNOSIS — R279 Unspecified lack of coordination: Secondary | ICD-10-CM | POA: Diagnosis not present

## 2017-03-21 DIAGNOSIS — G20 Parkinson's disease: Secondary | ICD-10-CM | POA: Diagnosis not present

## 2017-03-24 ENCOUNTER — Encounter (INDEPENDENT_AMBULATORY_CARE_PROVIDER_SITE_OTHER): Payer: Self-pay

## 2017-03-24 ENCOUNTER — Ambulatory Visit (INDEPENDENT_AMBULATORY_CARE_PROVIDER_SITE_OTHER): Payer: Medicare Other | Admitting: Neurology

## 2017-03-24 ENCOUNTER — Encounter: Payer: Self-pay | Admitting: Neurology

## 2017-03-24 VITALS — BP 85/65 | HR 72

## 2017-03-24 DIAGNOSIS — R293 Abnormal posture: Secondary | ICD-10-CM | POA: Diagnosis not present

## 2017-03-24 DIAGNOSIS — R2689 Other abnormalities of gait and mobility: Secondary | ICD-10-CM | POA: Diagnosis not present

## 2017-03-24 DIAGNOSIS — G20 Parkinson's disease: Secondary | ICD-10-CM

## 2017-03-24 DIAGNOSIS — Z9181 History of falling: Secondary | ICD-10-CM | POA: Diagnosis not present

## 2017-03-24 DIAGNOSIS — R441 Visual hallucinations: Secondary | ICD-10-CM

## 2017-03-24 DIAGNOSIS — R41841 Cognitive communication deficit: Secondary | ICD-10-CM | POA: Diagnosis not present

## 2017-03-24 DIAGNOSIS — R278 Other lack of coordination: Secondary | ICD-10-CM | POA: Diagnosis not present

## 2017-03-24 DIAGNOSIS — R279 Unspecified lack of coordination: Secondary | ICD-10-CM | POA: Diagnosis not present

## 2017-03-24 NOTE — Patient Instructions (Signed)
I suggest you continue with your current medications which are Sinemet 1-1/2 pills 3 times a day and Mirapex 1 mg 3 times a day. Please follow-up in about 4 months with Cecille Rubin, nurse practitioner.

## 2017-03-24 NOTE — Progress Notes (Signed)
Subjective:    Patient ID: [name] is a 76 y.o. female.  HPI     Interim history:   [name] is a 76 year old right-handed woman with an underlying complex medical history of hypertension, arthritis, anemia, cellulitis, chronic systolic heart failure, peptic ulcer disease, and chronic low back pain with history of T12 compression fracture, status post kyphoplasty in October 2010, who presents for follow-up consultation of her Parkinson's disease, complicated by motor complications, motor fluctuations and recurrent falls, deconditioning, Hx of cellulitis. The patient is accompanied by an aide from [facility] today. Of note, she no showed for an appointment on 12/30/2016. She is currently residing at [facility] rehabilitation. I last saw her on 02/07/2016, at which time she reported that the increase in Sinemet help with her freezing. She was taking one pill alternating with 1-1/2 pills, every 3 hours, starting at 6 AM and ending at 9 PM. She had a recent fall. Thankfully a family member was there. Her nephew and his wife check on her regularly, and her brother lives in [place], her other brother lives in [place] and usually supplies her with food for supper. She was diagnosed with cellulitis recently.   Today, 03/24/2017 (all dictated new, as well as above notes, some dictation done in note pad or Word, outside of chart, may appear as copied):   She reports no recent issues, takes her C/L, no recent falls. Of note, she had seen [name] in the interim on 06/18/2016, on 08/13/2016 and also on 10/08/2016. She missed an appointment on 12/18/2016. She has had multiple hospital admissions including for cellulitis, congestive heart failure, and on 06/10/2016 for a fall where she was found down by a caregiver. She was discharged on 06/12/2016. She was in rehabilitation. She was seen in the emergency room on 11/05/2016 secondary to hallucinations and delusions. She was transferred to [facility]  behavioral health. She was also treated empirically for cellulitis exacerbation. She was seen in the emergency room on 01/18/2017 after a right hip injury, presumed fall, no fracture was detected. She is on generic Sinemet 1-1/2 pills 3 times a day, generic Mirapex 1 mg 3 times a day and now also on Nuplazid, 17 mg, 2 pills daily. She reports that she was recently diagnosed with pneumonia and is on antibiotics, but I do not see any antibiotic listed on her medication list.   The patient's allergies, current medications, family history, past medical history, past social history, past surgical history and problem list were reviewed and updated as appropriate.   Previously (copied from previous notes for reference):   I saw her on 10/10/2015, at which time the patient reported feeling stable. [name] reported no recent falls. She did report of recent fall about 3 weeks prior in the kitchen, indicating that she let go of her walker and fell backwards. She did not hurt her self, she had ongoing issues with severe dyskinesias and some visual hallucinations. She had lower extremity swelling. She had been using oxycodone as needed for low back pain. I've reduced her Mirapex because of lower extremity swelling. She was advised to continue with Sinemet, one pills 6 times a day.    I saw her on 07/10/15, at which time she reported feeling okay. She had recently been in [name]. [name] moved in with her to help. Patient had not fallen since her [name] stay. She was getting HH therapies. She was using her 4 wheeled walker consistently. I suggested she continue with Sinemet 1 tablet 6  times a day and Mirapex 1.5 mg 3 times a day.   I saw her on 01/16/2015 for sooner than scheduled appointment due to more freezing reported. She reported taking Ativan 0.5 mg as needed for anxiety but not every day. She was taking Sinemet 1 pill 6 times a day. She was on Mirapex 1.5 mg 3 times a day. She was encouraged to continue these  medications. In addition, I suggested as needed Parcopa 25-100 milligrams strength half a pill up to twice daily to help with freezing. In addition, she was encouraged to start gabapentin slowly which was prescribed by another physician for leg pain. She was in [facility] at this time and enjoying it. Her cellulitis was improving, on Abx. Her freezing episodes were happening in the late morning and late afternoons. She reported no recent falls. In the interim, the patient called on 02/06/2015 reporting that the medication changes were not helpful. She was advised to stop the Parcopa and instead increase her Sinemet by alternating 1 pill with 1-1/2 pills for a total of 6 doses a day. Of note, she no showed for an appointment on 03/07/2015.   In the interim, she was seen by [name] Rubin, NP, on 03/14/2015, at which time, her medication including Sinemet and Mirapex were kept the same.   In the interim, she was hospitalized twice, on 04/23/2015 through 04/26/2015 for global weakness and failure to thrive, as well as worsening Parkinson symptoms. Amantadine was added to her regimen. She was discharged with home health therapies as she could not afford skilled nursing for rehabilitation. She was readmitted on 04/27/2015 for altered mental status in the context of UTI and recently started amantadine. She was discharged on 05/01/2015. She was treated for UTI and amantadine was discontinued. She was transferred to skilled nursing facility.   She never married. She has no children. She has a big extended family. She has 1 sister and 5 brothers. Her brother [name], sees her every day.   I first met her on 11/01/2014, at which time she reported more freezing and feeling worse. She fell the night before. She did not injure herself. She was living alone. She was getting Meals on Wheels and has a call alert button. I kept her on Sinemet 1-1/2 pills 4 times a day and Mirapex 1-1/2 mg strength one pill 3 times a day  which was maxed out. She presents for a sooner than scheduled appointment due to exacerbation of her tremors. In the interim, on 12/30/2014 she was seen at [facility] senior care for a cellulitis. She was placed on Cipro. She was also seen on 01/12/15 for follow up and started on gabapentin 100 mg tid for leg pain.    She previously saw Dr. Jim Like on 02/28/2014, at which time she was kept on levodopa at the current dose and Mirapex at the current dose. Prior to that she used to see Dr. Krista Blue (05/25/13) and has also seen our nurse practitioner, [name], last on 01/28/14. Her symptoms date back to 1999 when she first noticed L hand stiffness, fine motor dyscontrol and gait changes. She fell last night but thankfully did not injure herself. She was admitted to the hospital from 08/06/14 to 08/10/14 for N/V/D.   She lives alone. She has 5 brothers and 1 sister, 1 brother lives in [place] but otherwise her siblings are close by. One brother brings her supper every night. She gets Meals on Wheels. She has a call alert button. She  has no family history of Parkinson's disease. She uses a 2 wheeled walker at the house. She has generalized dyskinesias. She may not drink enough water. She has lower extremity swelling but this has improved. Her short-term memory is getting worse she feels. She denies any hallucinations.  Her Past Medical History Is Significant For: Past Medical History:  Diagnosis Date  . Acute on chronic systolic heart failure ([place])   . Anemia   . Anxiety   . Arthritis   . Cataract left  . Cellulitis and abscess of leg, except foot   . Cellulitis of lower leg 12/24/2011  . CHF (congestive heart failure) ([place])   . Chronic bronchitis ([place])    "usually get it q yr"  . Chronic systolic heart failure ([place])   . GERD (gastroesophageal reflux disease)   . Glaucoma left  . Helicobacter pylori (H. pylori)   . History of bleeding peptic ulcer   . History of blood transfusion    "don't remember  why"  . Hypercholesterolemia   . Hypertension   . Hypotension, unspecified   . Hypothyroidism   . Parkinson's disease   . Pneumonia   . Sleep apnea    "had test years ago; insurance wouldn't pay for mask so I never had one" (04/27/2015)  . Stroke [facility]) X 3   "that  was the reason I got Parkinson's"    Her Past Surgical History Is Significant For: Past Surgical History:  Procedure Laterality Date  . BACK SURGERY    . ESOPHAGOGASTRODUODENOSCOPY  09/26/2011   Procedure: ESOPHAGOGASTRODUODENOSCOPY (EGD);  Surgeon: Zenovia Jarred, MD;  Location: [facility] ENDOSCOPY;  Service: Gastroenterology;  Laterality: N/A;  To be done at bedside.  . ESOPHAGOGASTRODUODENOSCOPY N/A 09/30/2013   Procedure: ESOPHAGOGASTRODUODENOSCOPY (EGD);  Surgeon: [name], MD;  Location: [facility];  Service: Endoscopy;  Laterality: N/A;  . ESOPHAGOGASTRODUODENOSCOPY N/A 11/25/2013   Procedure: ESOPHAGOGASTRODUODENOSCOPY (EGD);  Surgeon: [name], MD;  Location: [facility] ENDOSCOPY;  Service: Endoscopy;  Laterality: N/A;  . HEMIARTHROPLASTY HIP Right 09/24/2007   Archie Endo 01/23/2011  . KYPHOPLASTY  06/2009   T12/notes 07/17/2009  . NISSEN FUNDOPLICATION     "had OR for GERD"  . TENDON REPAIR     Gluteus medius Ar[facility] Endo 01/23/2011  . THYROIDECTOMY    . TONSILLECTOMY      Her Family History Is Significant For: Family History  Problem Relation Age of Onset  . GER disease Mother   . Heart disease Father     Her Social History Is Significant For: Social History   Social History  . Marital status: Single    Spouse name: N/A  . Number of children: 0  . Years of education: 12   Occupational History  .      Retired   Social History Main Topics  . Smoking status: Never Smoker  . Smokeless tobacco: Never Used  . Alcohol use No  . Drug use: No  . Sexual activity: No   Other Topics Concern  . None   Social History Narrative   Patient lives at home alone. Patient is retired. Patient has high school education.    Caffeine- one daily.   Right handed.    Her Allergies Are:  Allergies  Allergen Reactions  . Codeine Other (See Comments)    Unknown allergic reaction  . Penicillins Other (See Comments)    Has patient had a PCN reaction causing immediate rash, facial/tongue/throat swelling, SOB or lightheadedness with hypotension: lightheadedness, passes out Has patient had a  PCN reaction causing severe rash involving mucus membranes or skin necrosis: No Has patient had a PCN reaction that required hospitalization No Has patient had a PCN reaction occurring within the last 10 years: No If all of the above answers are "NO", then may proceed with Cephalosporin use. Pt passed out in doctor's office afte  :   Her Current Medications Are:  Outpatient Encounter Prescriptions as of 03/24/2017  Medication Sig  . acetaminophen (TYLENOL) 500 MG tablet Take 500 mg by mouth every 8 (eight) hours as needed.  Marland Kitchen aspirin EC 81 MG tablet Take 81 mg by mouth daily.  . carbidopa-levodopa (SINEMET IR) 25-100 MG tablet Take 1.5 tablets by mouth 3 (three) times daily. 9am, 3pm, 9pm  . docusate sodium (COLACE) 100 MG capsule Take 100 mg by mouth daily. Reported on 02/28/2016  . ferrous sulfate 325 (65 FE) MG tablet Take 325 mg by mouth daily.  . furosemide (LASIX) 20 MG tablet Take 20 mg by mouth daily.   Marland Kitchen latanoprost (XALATAN) 0.005 % ophthalmic solution Place 1 drop into the left eye daily at 6 PM.   . lisinopril (PRINIVIL,ZESTRIL) 2.5 MG tablet Take by mouth.  Marland Kitchen LORazepam (ATIVAN) 0.5 MG tablet Take 0.5 mg by mouth 2 (two) times daily as needed.  . metoprolol tartrate (LOPRESSOR) 25 MG tablet Take 0.5 tablets (12.5 mg total) by mouth 2 (two) times daily.  . Multiple Vitamins-Minerals (MULTIVITAMIN WITH MINERALS) tablet Take 1 tablet by mouth daily.  Marland Kitchen Pimavanserin Tartrate (NUPLAZID) 17 MG TABS Take 2 tablets by mouth daily.  [name] Glycol-Propyl Glycol (SYSTANE OP) Place 1 drop into both eyes daily.   . pramipexole  (MIRAPEX) 1 MG tablet Take 1 tablet (1 mg total) by mouth 3 (three) times daily.  . Skin Protectants, Misc. (EUCERIN) cream Apply topically.  . traZODone (DESYREL) 50 MG tablet Take 50 mg by mouth at bedtime.  Marland Kitchen UNABLE TO FIND Med Name: Cartagen  . [DISCONTINUED] nystatin cream (MYCOSTATIN) Apply 1 application topically 2 (two) times daily.  . [DISCONTINUED] OLANZapine (ZYPREXA) 5 MG tablet Take by mouth.  . [DISCONTINUED] triamcinolone cream (KENALOG) 0.1 % Apply 1 application topically 2 (two) times daily.   No facility-administered encounter medications on file as of 03/24/2017.   :  Review of Systems:  Out of a complete 14 point review of systems, all are reviewed and negative with the exception of these symptoms as listed below: Review of Systems  Neurological:       Pt presents today to discuss her PD. Pt states that things are going well. [facility] resides at [facility].    Objective:  Neurological Exam  Physical Exam Physical Examination:   Vitals:   03/24/17 1139  BP: (!) 85/65  Pulse: 72    General Examination: The patient is a very pleasant 76 y.o. female in no acute distress. She appears frail and deconditioned, in wheelchair, adequately groomed.  HEENT: Normocephalic, atraumatic, pupils are equal, round and reactive to light and accommodation. Extraocular tracking shows moderate saccadic breakdown without nystagmus noted. There is limitation to upper gaze. There is mild decrease in eye blink rate. Hearing is intact. Face is symmetric with mild facial masking and normal facial sensation. There is no lip, neck or jaw tremor. Neck is mildly rigid with intact passive ROM. Oropharynx exam reveals moderate mouth dryness. No significant airway crowding is noted. Marginal dental hygiene. Mallampati is class II. Tongue protrudes centrally and palate elevates symmetrically. There is no drooling. Speech is moderately hypophonic  and moderately dysarthric.  Chest: is clear to auscultation  without wheezing, rhonchi or crackles noted.  Heart: sounds are regular and normal without murmurs, rubs or gallops noted.   Abdomen: is soft, non-tender and non-distended with normal bowel sounds appreciated on auscultation.  Extremities: There is 1+ pitting edema in the distal lower extremities bilaterally, mild redness and thickness of the skin. Chronic appearing changes.   Skin: is warm and dry with no trophic changes noted. Age-related changes are noted on the skin, mild bruising forearms.   Musculoskeletal: exam reveals no obvious joint deformities, tenderness, joint swelling or erythema, except for arthritic changes in her hands.  Neurologically:  Mental status: The patient is awake and alert, paying fair attention. She is able to partially provide the history. She is oriented to: person, place, situation, day of week, month of year and year. Her memory, attention, language and knowledge are impaired. She has a mild degree of bradyphrenia. Speech is moderately hypophonic with moderate dysarthria noted. Mood is congruent and affect is normal.   Cranial nerves are as described above under HEENT exam. She is leaning to the right. It is difficult to examine her shoulder height and shoulder strength. Motor exam: thin bulk, and global strength of 4 out of 5. No significant dyskinesias are noted, tone is mildly rigid with no significant cogwheeling noted, there is moderate. There is no drift or rebound.  There is no significant resting tremor today.   Romberg is not tested as she is not safe to stand unassisted.   Reflexes are 1+ in the upper extremities and trace in the lower extremities.   Fine motor skills exam:  moderate impairment noted, lateralization noted to the left.  Cerebellar testing is not further possible.   Sensory exam is intact to light touch in the upper and lower extremities.   Gait, station and balance: She is not safe to stand or walk.   Assessment and Plan:    In summary, KAMERIN AXFORD is a very pleasant 76 year old female with an underlying complex medical history of hypertension, arthritis, anemia, cellulitis, chronic systolic heart failure and cor pulmonale, peptic ulcer disease, and chronic low back pain with history of T12 compression fracture, status post kyphoplasty in October 2010, who presents for follow-up consultation of her parkinsonism with Sx dating back to 1999. Her history and physical exam are in keeping with, left-sided predominant PD, complicated by dyskinesias, hallucinations, overall frailty, lower extremity swelling, cellulitis, freezing and recurrent falls and overall deconditioning. She has had multiple hospital admissions and inpatient rehabilitation as well as emergency room visits, as well as recent behavioral admission.  She was not able to tolerate amantadine and we reduced her Mirapex d/t LE swelling, this appears to have improved some. She is on Sinemet 1-1/2 pills tid. She has ongoing visual hallucinations and is now on Nuplazid 34 mg daily. She is advised to continue with her current medication regimen. I suggested a four-month checkup with [name], nurse practitioner, sooner as needed. I spent 15 minutes in total face-to-face time with the patient, more than 50% of which was spent in counseling and coordination of care, reviewing test results, reviewing medication and discussing or reviewing the diagnosis of PD, its prognosis and treatment options. Pertinent laboratory and imaging test results that were available during this visit with the patient were reviewed by me and considered in my medical decision making (see chart for details).

## 2017-03-25 DIAGNOSIS — R293 Abnormal posture: Secondary | ICD-10-CM | POA: Diagnosis not present

## 2017-03-25 DIAGNOSIS — R2689 Other abnormalities of gait and mobility: Secondary | ICD-10-CM | POA: Diagnosis not present

## 2017-03-25 DIAGNOSIS — R41841 Cognitive communication deficit: Secondary | ICD-10-CM | POA: Diagnosis not present

## 2017-03-25 DIAGNOSIS — R279 Unspecified lack of coordination: Secondary | ICD-10-CM | POA: Diagnosis not present

## 2017-03-25 DIAGNOSIS — G20 Parkinson's disease: Secondary | ICD-10-CM | POA: Diagnosis not present

## 2017-03-25 DIAGNOSIS — R278 Other lack of coordination: Secondary | ICD-10-CM | POA: Diagnosis not present

## 2017-03-26 DIAGNOSIS — R278 Other lack of coordination: Secondary | ICD-10-CM | POA: Diagnosis not present

## 2017-03-26 DIAGNOSIS — R279 Unspecified lack of coordination: Secondary | ICD-10-CM | POA: Diagnosis not present

## 2017-03-26 DIAGNOSIS — R2689 Other abnormalities of gait and mobility: Secondary | ICD-10-CM | POA: Diagnosis not present

## 2017-03-26 DIAGNOSIS — G20 Parkinson's disease: Secondary | ICD-10-CM | POA: Diagnosis not present

## 2017-03-26 DIAGNOSIS — R41841 Cognitive communication deficit: Secondary | ICD-10-CM | POA: Diagnosis not present

## 2017-03-26 DIAGNOSIS — R293 Abnormal posture: Secondary | ICD-10-CM | POA: Diagnosis not present

## 2017-03-27 DIAGNOSIS — R293 Abnormal posture: Secondary | ICD-10-CM | POA: Diagnosis not present

## 2017-03-27 DIAGNOSIS — R2689 Other abnormalities of gait and mobility: Secondary | ICD-10-CM | POA: Diagnosis not present

## 2017-03-27 DIAGNOSIS — G20 Parkinson's disease: Secondary | ICD-10-CM | POA: Diagnosis not present

## 2017-03-27 DIAGNOSIS — R278 Other lack of coordination: Secondary | ICD-10-CM | POA: Diagnosis not present

## 2017-03-27 DIAGNOSIS — R279 Unspecified lack of coordination: Secondary | ICD-10-CM | POA: Diagnosis not present

## 2017-03-27 DIAGNOSIS — R41841 Cognitive communication deficit: Secondary | ICD-10-CM | POA: Diagnosis not present

## 2017-03-28 DIAGNOSIS — R279 Unspecified lack of coordination: Secondary | ICD-10-CM | POA: Diagnosis not present

## 2017-03-28 DIAGNOSIS — R293 Abnormal posture: Secondary | ICD-10-CM | POA: Diagnosis not present

## 2017-03-28 DIAGNOSIS — R278 Other lack of coordination: Secondary | ICD-10-CM | POA: Diagnosis not present

## 2017-03-28 DIAGNOSIS — R2689 Other abnormalities of gait and mobility: Secondary | ICD-10-CM | POA: Diagnosis not present

## 2017-03-28 DIAGNOSIS — G20 Parkinson's disease: Secondary | ICD-10-CM | POA: Diagnosis not present

## 2017-03-28 DIAGNOSIS — R41841 Cognitive communication deficit: Secondary | ICD-10-CM | POA: Diagnosis not present

## 2017-03-31 DIAGNOSIS — R41841 Cognitive communication deficit: Secondary | ICD-10-CM | POA: Diagnosis not present

## 2017-03-31 DIAGNOSIS — G20 Parkinson's disease: Secondary | ICD-10-CM | POA: Diagnosis not present

## 2017-03-31 DIAGNOSIS — R279 Unspecified lack of coordination: Secondary | ICD-10-CM | POA: Diagnosis not present

## 2017-03-31 DIAGNOSIS — R278 Other lack of coordination: Secondary | ICD-10-CM | POA: Diagnosis not present

## 2017-03-31 DIAGNOSIS — R293 Abnormal posture: Secondary | ICD-10-CM | POA: Diagnosis not present

## 2017-03-31 DIAGNOSIS — R2689 Other abnormalities of gait and mobility: Secondary | ICD-10-CM | POA: Diagnosis not present

## 2017-04-01 DIAGNOSIS — G20 Parkinson's disease: Secondary | ICD-10-CM | POA: Diagnosis not present

## 2017-04-01 DIAGNOSIS — R2689 Other abnormalities of gait and mobility: Secondary | ICD-10-CM | POA: Diagnosis not present

## 2017-04-01 DIAGNOSIS — R278 Other lack of coordination: Secondary | ICD-10-CM | POA: Diagnosis not present

## 2017-04-01 DIAGNOSIS — R293 Abnormal posture: Secondary | ICD-10-CM | POA: Diagnosis not present

## 2017-04-01 DIAGNOSIS — R41841 Cognitive communication deficit: Secondary | ICD-10-CM | POA: Diagnosis not present

## 2017-04-01 DIAGNOSIS — R279 Unspecified lack of coordination: Secondary | ICD-10-CM | POA: Diagnosis not present

## 2017-04-02 DIAGNOSIS — R279 Unspecified lack of coordination: Secondary | ICD-10-CM | POA: Diagnosis not present

## 2017-04-02 DIAGNOSIS — R2689 Other abnormalities of gait and mobility: Secondary | ICD-10-CM | POA: Diagnosis not present

## 2017-04-02 DIAGNOSIS — G20 Parkinson's disease: Secondary | ICD-10-CM | POA: Diagnosis not present

## 2017-04-02 DIAGNOSIS — R293 Abnormal posture: Secondary | ICD-10-CM | POA: Diagnosis not present

## 2017-04-02 DIAGNOSIS — R278 Other lack of coordination: Secondary | ICD-10-CM | POA: Diagnosis not present

## 2017-04-02 DIAGNOSIS — R41841 Cognitive communication deficit: Secondary | ICD-10-CM | POA: Diagnosis not present

## 2017-04-03 DIAGNOSIS — R279 Unspecified lack of coordination: Secondary | ICD-10-CM | POA: Diagnosis not present

## 2017-04-03 DIAGNOSIS — R2689 Other abnormalities of gait and mobility: Secondary | ICD-10-CM | POA: Diagnosis not present

## 2017-04-03 DIAGNOSIS — R278 Other lack of coordination: Secondary | ICD-10-CM | POA: Diagnosis not present

## 2017-04-03 DIAGNOSIS — R41841 Cognitive communication deficit: Secondary | ICD-10-CM | POA: Diagnosis not present

## 2017-04-03 DIAGNOSIS — R293 Abnormal posture: Secondary | ICD-10-CM | POA: Diagnosis not present

## 2017-04-03 DIAGNOSIS — G20 Parkinson's disease: Secondary | ICD-10-CM | POA: Diagnosis not present

## 2017-04-04 DIAGNOSIS — R2689 Other abnormalities of gait and mobility: Secondary | ICD-10-CM | POA: Diagnosis not present

## 2017-04-04 DIAGNOSIS — R293 Abnormal posture: Secondary | ICD-10-CM | POA: Diagnosis not present

## 2017-04-04 DIAGNOSIS — R41841 Cognitive communication deficit: Secondary | ICD-10-CM | POA: Diagnosis not present

## 2017-04-04 DIAGNOSIS — G20 Parkinson's disease: Secondary | ICD-10-CM | POA: Diagnosis not present

## 2017-04-04 DIAGNOSIS — R279 Unspecified lack of coordination: Secondary | ICD-10-CM | POA: Diagnosis not present

## 2017-04-04 DIAGNOSIS — R278 Other lack of coordination: Secondary | ICD-10-CM | POA: Diagnosis not present

## 2017-04-05 DIAGNOSIS — R2689 Other abnormalities of gait and mobility: Secondary | ICD-10-CM | POA: Diagnosis not present

## 2017-04-05 DIAGNOSIS — G20 Parkinson's disease: Secondary | ICD-10-CM | POA: Diagnosis not present

## 2017-04-05 DIAGNOSIS — R293 Abnormal posture: Secondary | ICD-10-CM | POA: Diagnosis not present

## 2017-04-05 DIAGNOSIS — R41841 Cognitive communication deficit: Secondary | ICD-10-CM | POA: Diagnosis not present

## 2017-04-05 DIAGNOSIS — R278 Other lack of coordination: Secondary | ICD-10-CM | POA: Diagnosis not present

## 2017-04-05 DIAGNOSIS — R279 Unspecified lack of coordination: Secondary | ICD-10-CM | POA: Diagnosis not present

## 2017-04-07 DIAGNOSIS — R278 Other lack of coordination: Secondary | ICD-10-CM | POA: Diagnosis not present

## 2017-04-07 DIAGNOSIS — R279 Unspecified lack of coordination: Secondary | ICD-10-CM | POA: Diagnosis not present

## 2017-04-07 DIAGNOSIS — G20 Parkinson's disease: Secondary | ICD-10-CM | POA: Diagnosis not present

## 2017-04-07 DIAGNOSIS — R2689 Other abnormalities of gait and mobility: Secondary | ICD-10-CM | POA: Diagnosis not present

## 2017-04-07 DIAGNOSIS — R293 Abnormal posture: Secondary | ICD-10-CM | POA: Diagnosis not present

## 2017-04-07 DIAGNOSIS — R41841 Cognitive communication deficit: Secondary | ICD-10-CM | POA: Diagnosis not present

## 2017-04-08 DIAGNOSIS — R2689 Other abnormalities of gait and mobility: Secondary | ICD-10-CM | POA: Diagnosis not present

## 2017-04-08 DIAGNOSIS — R293 Abnormal posture: Secondary | ICD-10-CM | POA: Diagnosis not present

## 2017-04-08 DIAGNOSIS — G20 Parkinson's disease: Secondary | ICD-10-CM | POA: Diagnosis not present

## 2017-04-08 DIAGNOSIS — R278 Other lack of coordination: Secondary | ICD-10-CM | POA: Diagnosis not present

## 2017-04-08 DIAGNOSIS — R279 Unspecified lack of coordination: Secondary | ICD-10-CM | POA: Diagnosis not present

## 2017-04-08 DIAGNOSIS — R41841 Cognitive communication deficit: Secondary | ICD-10-CM | POA: Diagnosis not present

## 2017-04-09 DIAGNOSIS — R279 Unspecified lack of coordination: Secondary | ICD-10-CM | POA: Diagnosis not present

## 2017-04-09 DIAGNOSIS — R278 Other lack of coordination: Secondary | ICD-10-CM | POA: Diagnosis not present

## 2017-04-09 DIAGNOSIS — G20 Parkinson's disease: Secondary | ICD-10-CM | POA: Diagnosis not present

## 2017-04-09 DIAGNOSIS — R293 Abnormal posture: Secondary | ICD-10-CM | POA: Diagnosis not present

## 2017-04-09 DIAGNOSIS — R2689 Other abnormalities of gait and mobility: Secondary | ICD-10-CM | POA: Diagnosis not present

## 2017-04-09 DIAGNOSIS — R41841 Cognitive communication deficit: Secondary | ICD-10-CM | POA: Diagnosis not present

## 2017-04-10 DIAGNOSIS — R293 Abnormal posture: Secondary | ICD-10-CM | POA: Diagnosis not present

## 2017-04-10 DIAGNOSIS — R41841 Cognitive communication deficit: Secondary | ICD-10-CM | POA: Diagnosis not present

## 2017-04-10 DIAGNOSIS — R278 Other lack of coordination: Secondary | ICD-10-CM | POA: Diagnosis not present

## 2017-04-10 DIAGNOSIS — G20 Parkinson's disease: Secondary | ICD-10-CM | POA: Diagnosis not present

## 2017-04-10 DIAGNOSIS — R2689 Other abnormalities of gait and mobility: Secondary | ICD-10-CM | POA: Diagnosis not present

## 2017-04-10 DIAGNOSIS — R279 Unspecified lack of coordination: Secondary | ICD-10-CM | POA: Diagnosis not present

## 2017-04-11 DIAGNOSIS — R279 Unspecified lack of coordination: Secondary | ICD-10-CM | POA: Diagnosis not present

## 2017-04-11 DIAGNOSIS — R293 Abnormal posture: Secondary | ICD-10-CM | POA: Diagnosis not present

## 2017-04-11 DIAGNOSIS — G20 Parkinson's disease: Secondary | ICD-10-CM | POA: Diagnosis not present

## 2017-04-11 DIAGNOSIS — R2689 Other abnormalities of gait and mobility: Secondary | ICD-10-CM | POA: Diagnosis not present

## 2017-04-11 DIAGNOSIS — R278 Other lack of coordination: Secondary | ICD-10-CM | POA: Diagnosis not present

## 2017-04-11 DIAGNOSIS — R41841 Cognitive communication deficit: Secondary | ICD-10-CM | POA: Diagnosis not present

## 2017-04-14 DIAGNOSIS — R41841 Cognitive communication deficit: Secondary | ICD-10-CM | POA: Diagnosis not present

## 2017-04-14 DIAGNOSIS — G20 Parkinson's disease: Secondary | ICD-10-CM | POA: Diagnosis not present

## 2017-04-14 DIAGNOSIS — R278 Other lack of coordination: Secondary | ICD-10-CM | POA: Diagnosis not present

## 2017-04-14 DIAGNOSIS — R2689 Other abnormalities of gait and mobility: Secondary | ICD-10-CM | POA: Diagnosis not present

## 2017-04-14 DIAGNOSIS — R293 Abnormal posture: Secondary | ICD-10-CM | POA: Diagnosis not present

## 2017-04-14 DIAGNOSIS — R279 Unspecified lack of coordination: Secondary | ICD-10-CM | POA: Diagnosis not present

## 2017-04-15 DIAGNOSIS — G20 Parkinson's disease: Secondary | ICD-10-CM | POA: Diagnosis not present

## 2017-04-15 DIAGNOSIS — R293 Abnormal posture: Secondary | ICD-10-CM | POA: Diagnosis not present

## 2017-04-15 DIAGNOSIS — R278 Other lack of coordination: Secondary | ICD-10-CM | POA: Diagnosis not present

## 2017-04-15 DIAGNOSIS — R279 Unspecified lack of coordination: Secondary | ICD-10-CM | POA: Diagnosis not present

## 2017-04-15 DIAGNOSIS — R2689 Other abnormalities of gait and mobility: Secondary | ICD-10-CM | POA: Diagnosis not present

## 2017-04-15 DIAGNOSIS — R41841 Cognitive communication deficit: Secondary | ICD-10-CM | POA: Diagnosis not present

## 2017-04-16 DIAGNOSIS — G20 Parkinson's disease: Secondary | ICD-10-CM | POA: Diagnosis not present

## 2017-04-16 DIAGNOSIS — R279 Unspecified lack of coordination: Secondary | ICD-10-CM | POA: Diagnosis not present

## 2017-04-16 DIAGNOSIS — R278 Other lack of coordination: Secondary | ICD-10-CM | POA: Diagnosis not present

## 2017-04-16 DIAGNOSIS — R293 Abnormal posture: Secondary | ICD-10-CM | POA: Diagnosis not present

## 2017-04-16 DIAGNOSIS — R2689 Other abnormalities of gait and mobility: Secondary | ICD-10-CM | POA: Diagnosis not present

## 2017-04-16 DIAGNOSIS — R41841 Cognitive communication deficit: Secondary | ICD-10-CM | POA: Diagnosis not present

## 2017-04-17 DIAGNOSIS — G20 Parkinson's disease: Secondary | ICD-10-CM | POA: Diagnosis not present

## 2017-04-17 DIAGNOSIS — R2689 Other abnormalities of gait and mobility: Secondary | ICD-10-CM | POA: Diagnosis not present

## 2017-04-17 DIAGNOSIS — R278 Other lack of coordination: Secondary | ICD-10-CM | POA: Diagnosis not present

## 2017-04-17 DIAGNOSIS — R293 Abnormal posture: Secondary | ICD-10-CM | POA: Diagnosis not present

## 2017-04-17 DIAGNOSIS — R279 Unspecified lack of coordination: Secondary | ICD-10-CM | POA: Diagnosis not present

## 2017-04-17 DIAGNOSIS — R41841 Cognitive communication deficit: Secondary | ICD-10-CM | POA: Diagnosis not present

## 2017-04-18 DIAGNOSIS — R41841 Cognitive communication deficit: Secondary | ICD-10-CM | POA: Diagnosis not present

## 2017-04-18 DIAGNOSIS — R278 Other lack of coordination: Secondary | ICD-10-CM | POA: Diagnosis not present

## 2017-04-18 DIAGNOSIS — G20 Parkinson's disease: Secondary | ICD-10-CM | POA: Diagnosis not present

## 2017-04-18 DIAGNOSIS — R2689 Other abnormalities of gait and mobility: Secondary | ICD-10-CM | POA: Diagnosis not present

## 2017-04-18 DIAGNOSIS — R279 Unspecified lack of coordination: Secondary | ICD-10-CM | POA: Diagnosis not present

## 2017-04-18 DIAGNOSIS — R293 Abnormal posture: Secondary | ICD-10-CM | POA: Diagnosis not present

## 2017-04-21 DIAGNOSIS — R293 Abnormal posture: Secondary | ICD-10-CM | POA: Diagnosis not present

## 2017-04-21 DIAGNOSIS — R2689 Other abnormalities of gait and mobility: Secondary | ICD-10-CM | POA: Diagnosis not present

## 2017-04-21 DIAGNOSIS — R279 Unspecified lack of coordination: Secondary | ICD-10-CM | POA: Diagnosis not present

## 2017-04-21 DIAGNOSIS — R41841 Cognitive communication deficit: Secondary | ICD-10-CM | POA: Diagnosis not present

## 2017-04-21 DIAGNOSIS — G20 Parkinson's disease: Secondary | ICD-10-CM | POA: Diagnosis not present

## 2017-04-21 DIAGNOSIS — R278 Other lack of coordination: Secondary | ICD-10-CM | POA: Diagnosis not present

## 2017-04-22 DIAGNOSIS — R278 Other lack of coordination: Secondary | ICD-10-CM | POA: Diagnosis not present

## 2017-04-22 DIAGNOSIS — G20 Parkinson's disease: Secondary | ICD-10-CM | POA: Diagnosis not present

## 2017-04-22 DIAGNOSIS — R279 Unspecified lack of coordination: Secondary | ICD-10-CM | POA: Diagnosis not present

## 2017-04-22 DIAGNOSIS — R293 Abnormal posture: Secondary | ICD-10-CM | POA: Diagnosis not present

## 2017-04-22 DIAGNOSIS — R2689 Other abnormalities of gait and mobility: Secondary | ICD-10-CM | POA: Diagnosis not present

## 2017-04-22 DIAGNOSIS — R41841 Cognitive communication deficit: Secondary | ICD-10-CM | POA: Diagnosis not present

## 2017-04-23 DIAGNOSIS — R41841 Cognitive communication deficit: Secondary | ICD-10-CM | POA: Diagnosis not present

## 2017-04-23 DIAGNOSIS — R293 Abnormal posture: Secondary | ICD-10-CM | POA: Diagnosis not present

## 2017-04-23 DIAGNOSIS — R279 Unspecified lack of coordination: Secondary | ICD-10-CM | POA: Diagnosis not present

## 2017-04-23 DIAGNOSIS — G20 Parkinson's disease: Secondary | ICD-10-CM | POA: Diagnosis not present

## 2017-04-23 DIAGNOSIS — R2689 Other abnormalities of gait and mobility: Secondary | ICD-10-CM | POA: Diagnosis not present

## 2017-04-24 ENCOUNTER — Telehealth: Payer: Self-pay | Admitting: Neurology

## 2017-04-24 DIAGNOSIS — R41841 Cognitive communication deficit: Secondary | ICD-10-CM | POA: Diagnosis not present

## 2017-04-24 DIAGNOSIS — R279 Unspecified lack of coordination: Secondary | ICD-10-CM | POA: Diagnosis not present

## 2017-04-24 DIAGNOSIS — G20 Parkinson's disease: Secondary | ICD-10-CM | POA: Diagnosis not present

## 2017-04-24 DIAGNOSIS — R293 Abnormal posture: Secondary | ICD-10-CM | POA: Diagnosis not present

## 2017-04-24 DIAGNOSIS — R2689 Other abnormalities of gait and mobility: Secondary | ICD-10-CM | POA: Diagnosis not present

## 2017-04-24 NOTE — Telephone Encounter (Signed)
Our office has never prescribed the pt nuplazid. I called PK at [facility] back and advised her of this.

## 2017-04-24 NOTE — Telephone Encounter (Addendum)
PK/[facility] [phone] called the office said pt's brother has been paying for Pimavanserin Tartrate (NUPLAZID) 17 MG TABS but he says it is too expensive and does not want to pay for it. Is there an alternative? Please call

## 2017-04-25 DIAGNOSIS — R279 Unspecified lack of coordination: Secondary | ICD-10-CM | POA: Diagnosis not present

## 2017-04-25 DIAGNOSIS — R2689 Other abnormalities of gait and mobility: Secondary | ICD-10-CM | POA: Diagnosis not present

## 2017-04-25 DIAGNOSIS — R41841 Cognitive communication deficit: Secondary | ICD-10-CM | POA: Diagnosis not present

## 2017-04-25 DIAGNOSIS — G20 Parkinson's disease: Secondary | ICD-10-CM | POA: Diagnosis not present

## 2017-04-25 DIAGNOSIS — R293 Abnormal posture: Secondary | ICD-10-CM | POA: Diagnosis not present

## 2017-04-28 DIAGNOSIS — R279 Unspecified lack of coordination: Secondary | ICD-10-CM | POA: Diagnosis not present

## 2017-04-28 DIAGNOSIS — R41841 Cognitive communication deficit: Secondary | ICD-10-CM | POA: Diagnosis not present

## 2017-04-28 DIAGNOSIS — R2689 Other abnormalities of gait and mobility: Secondary | ICD-10-CM | POA: Diagnosis not present

## 2017-04-28 DIAGNOSIS — R293 Abnormal posture: Secondary | ICD-10-CM | POA: Diagnosis not present

## 2017-04-28 DIAGNOSIS — G20 Parkinson's disease: Secondary | ICD-10-CM | POA: Diagnosis not present

## 2017-04-29 DIAGNOSIS — R41841 Cognitive communication deficit: Secondary | ICD-10-CM | POA: Diagnosis not present

## 2017-04-29 DIAGNOSIS — R2689 Other abnormalities of gait and mobility: Secondary | ICD-10-CM | POA: Diagnosis not present

## 2017-04-29 DIAGNOSIS — R293 Abnormal posture: Secondary | ICD-10-CM | POA: Diagnosis not present

## 2017-04-29 DIAGNOSIS — G20 Parkinson's disease: Secondary | ICD-10-CM | POA: Diagnosis not present

## 2017-04-29 DIAGNOSIS — R279 Unspecified lack of coordination: Secondary | ICD-10-CM | POA: Diagnosis not present

## 2017-04-30 DIAGNOSIS — G20 Parkinson's disease: Secondary | ICD-10-CM | POA: Diagnosis not present

## 2017-04-30 DIAGNOSIS — R2689 Other abnormalities of gait and mobility: Secondary | ICD-10-CM | POA: Diagnosis not present

## 2017-04-30 DIAGNOSIS — R279 Unspecified lack of coordination: Secondary | ICD-10-CM | POA: Diagnosis not present

## 2017-04-30 DIAGNOSIS — R293 Abnormal posture: Secondary | ICD-10-CM | POA: Diagnosis not present

## 2017-04-30 DIAGNOSIS — R41841 Cognitive communication deficit: Secondary | ICD-10-CM | POA: Diagnosis not present

## 2017-05-01 DIAGNOSIS — R41841 Cognitive communication deficit: Secondary | ICD-10-CM | POA: Diagnosis not present

## 2017-05-01 DIAGNOSIS — F339 Major depressive disorder, recurrent, unspecified: Secondary | ICD-10-CM | POA: Diagnosis not present

## 2017-05-01 DIAGNOSIS — F419 Anxiety disorder, unspecified: Secondary | ICD-10-CM | POA: Diagnosis not present

## 2017-05-01 DIAGNOSIS — R2689 Other abnormalities of gait and mobility: Secondary | ICD-10-CM | POA: Diagnosis not present

## 2017-05-01 DIAGNOSIS — G20 Parkinson's disease: Secondary | ICD-10-CM | POA: Diagnosis not present

## 2017-05-01 DIAGNOSIS — R279 Unspecified lack of coordination: Secondary | ICD-10-CM | POA: Diagnosis not present

## 2017-05-01 DIAGNOSIS — R293 Abnormal posture: Secondary | ICD-10-CM | POA: Diagnosis not present

## 2017-05-01 DIAGNOSIS — F0280 Dementia in other diseases classified elsewhere without behavioral disturbance: Secondary | ICD-10-CM | POA: Diagnosis not present

## 2017-05-02 DIAGNOSIS — G20 Parkinson's disease: Secondary | ICD-10-CM | POA: Diagnosis not present

## 2017-05-02 DIAGNOSIS — R293 Abnormal posture: Secondary | ICD-10-CM | POA: Diagnosis not present

## 2017-05-02 DIAGNOSIS — R2689 Other abnormalities of gait and mobility: Secondary | ICD-10-CM | POA: Diagnosis not present

## 2017-05-02 DIAGNOSIS — R279 Unspecified lack of coordination: Secondary | ICD-10-CM | POA: Diagnosis not present

## 2017-05-02 DIAGNOSIS — R41841 Cognitive communication deficit: Secondary | ICD-10-CM | POA: Diagnosis not present

## 2017-05-05 DIAGNOSIS — R293 Abnormal posture: Secondary | ICD-10-CM | POA: Diagnosis not present

## 2017-05-05 DIAGNOSIS — R279 Unspecified lack of coordination: Secondary | ICD-10-CM | POA: Diagnosis not present

## 2017-05-05 DIAGNOSIS — R41841 Cognitive communication deficit: Secondary | ICD-10-CM | POA: Diagnosis not present

## 2017-05-05 DIAGNOSIS — G20 Parkinson's disease: Secondary | ICD-10-CM | POA: Diagnosis not present

## 2017-05-05 DIAGNOSIS — R2689 Other abnormalities of gait and mobility: Secondary | ICD-10-CM | POA: Diagnosis not present

## 2017-05-06 DIAGNOSIS — R41841 Cognitive communication deficit: Secondary | ICD-10-CM | POA: Diagnosis not present

## 2017-05-06 DIAGNOSIS — G20 Parkinson's disease: Secondary | ICD-10-CM | POA: Diagnosis not present

## 2017-05-06 DIAGNOSIS — R279 Unspecified lack of coordination: Secondary | ICD-10-CM | POA: Diagnosis not present

## 2017-05-06 DIAGNOSIS — R293 Abnormal posture: Secondary | ICD-10-CM | POA: Diagnosis not present

## 2017-05-06 DIAGNOSIS — R2689 Other abnormalities of gait and mobility: Secondary | ICD-10-CM | POA: Diagnosis not present

## 2017-05-07 DIAGNOSIS — R279 Unspecified lack of coordination: Secondary | ICD-10-CM | POA: Diagnosis not present

## 2017-05-07 DIAGNOSIS — R41841 Cognitive communication deficit: Secondary | ICD-10-CM | POA: Diagnosis not present

## 2017-05-07 DIAGNOSIS — R293 Abnormal posture: Secondary | ICD-10-CM | POA: Diagnosis not present

## 2017-05-07 DIAGNOSIS — R2689 Other abnormalities of gait and mobility: Secondary | ICD-10-CM | POA: Diagnosis not present

## 2017-05-07 DIAGNOSIS — G20 Parkinson's disease: Secondary | ICD-10-CM | POA: Diagnosis not present

## 2017-05-12 DIAGNOSIS — G20 Parkinson's disease: Secondary | ICD-10-CM | POA: Diagnosis not present

## 2017-05-12 DIAGNOSIS — R279 Unspecified lack of coordination: Secondary | ICD-10-CM | POA: Diagnosis not present

## 2017-05-12 DIAGNOSIS — R41841 Cognitive communication deficit: Secondary | ICD-10-CM | POA: Diagnosis not present

## 2017-05-12 DIAGNOSIS — R293 Abnormal posture: Secondary | ICD-10-CM | POA: Diagnosis not present

## 2017-05-12 DIAGNOSIS — R2689 Other abnormalities of gait and mobility: Secondary | ICD-10-CM | POA: Diagnosis not present

## 2017-05-14 DIAGNOSIS — R2689 Other abnormalities of gait and mobility: Secondary | ICD-10-CM | POA: Diagnosis not present

## 2017-05-14 DIAGNOSIS — R41841 Cognitive communication deficit: Secondary | ICD-10-CM | POA: Diagnosis not present

## 2017-05-14 DIAGNOSIS — R279 Unspecified lack of coordination: Secondary | ICD-10-CM | POA: Diagnosis not present

## 2017-05-14 DIAGNOSIS — R293 Abnormal posture: Secondary | ICD-10-CM | POA: Diagnosis not present

## 2017-05-14 DIAGNOSIS — G20 Parkinson's disease: Secondary | ICD-10-CM | POA: Diagnosis not present

## 2017-05-15 DIAGNOSIS — R2689 Other abnormalities of gait and mobility: Secondary | ICD-10-CM | POA: Diagnosis not present

## 2017-05-15 DIAGNOSIS — R279 Unspecified lack of coordination: Secondary | ICD-10-CM | POA: Diagnosis not present

## 2017-05-15 DIAGNOSIS — R293 Abnormal posture: Secondary | ICD-10-CM | POA: Diagnosis not present

## 2017-05-15 DIAGNOSIS — R41841 Cognitive communication deficit: Secondary | ICD-10-CM | POA: Diagnosis not present

## 2017-05-15 DIAGNOSIS — G20 Parkinson's disease: Secondary | ICD-10-CM | POA: Diagnosis not present

## 2017-05-19 DIAGNOSIS — G20 Parkinson's disease: Secondary | ICD-10-CM | POA: Diagnosis not present

## 2017-05-19 DIAGNOSIS — R41841 Cognitive communication deficit: Secondary | ICD-10-CM | POA: Diagnosis not present

## 2017-05-19 DIAGNOSIS — R2689 Other abnormalities of gait and mobility: Secondary | ICD-10-CM | POA: Diagnosis not present

## 2017-05-19 DIAGNOSIS — R293 Abnormal posture: Secondary | ICD-10-CM | POA: Diagnosis not present

## 2017-05-19 DIAGNOSIS — R279 Unspecified lack of coordination: Secondary | ICD-10-CM | POA: Diagnosis not present

## 2017-05-20 DIAGNOSIS — G20 Parkinson's disease: Secondary | ICD-10-CM | POA: Diagnosis not present

## 2017-05-20 DIAGNOSIS — R293 Abnormal posture: Secondary | ICD-10-CM | POA: Diagnosis not present

## 2017-05-20 DIAGNOSIS — R2689 Other abnormalities of gait and mobility: Secondary | ICD-10-CM | POA: Diagnosis not present

## 2017-05-20 DIAGNOSIS — R41841 Cognitive communication deficit: Secondary | ICD-10-CM | POA: Diagnosis not present

## 2017-05-20 DIAGNOSIS — R279 Unspecified lack of coordination: Secondary | ICD-10-CM | POA: Diagnosis not present

## 2017-05-21 DIAGNOSIS — R41841 Cognitive communication deficit: Secondary | ICD-10-CM | POA: Diagnosis not present

## 2017-05-21 DIAGNOSIS — R2689 Other abnormalities of gait and mobility: Secondary | ICD-10-CM | POA: Diagnosis not present

## 2017-05-21 DIAGNOSIS — R279 Unspecified lack of coordination: Secondary | ICD-10-CM | POA: Diagnosis not present

## 2017-05-21 DIAGNOSIS — G20 Parkinson's disease: Secondary | ICD-10-CM | POA: Diagnosis not present

## 2017-05-21 DIAGNOSIS — R293 Abnormal posture: Secondary | ICD-10-CM | POA: Diagnosis not present

## 2017-05-22 DIAGNOSIS — R41841 Cognitive communication deficit: Secondary | ICD-10-CM | POA: Diagnosis not present

## 2017-05-22 DIAGNOSIS — R293 Abnormal posture: Secondary | ICD-10-CM | POA: Diagnosis not present

## 2017-05-22 DIAGNOSIS — R279 Unspecified lack of coordination: Secondary | ICD-10-CM | POA: Diagnosis not present

## 2017-05-22 DIAGNOSIS — G20 Parkinson's disease: Secondary | ICD-10-CM | POA: Diagnosis not present

## 2017-05-22 DIAGNOSIS — R2689 Other abnormalities of gait and mobility: Secondary | ICD-10-CM | POA: Diagnosis not present

## 2017-05-24 DIAGNOSIS — R279 Unspecified lack of coordination: Secondary | ICD-10-CM | POA: Diagnosis not present

## 2017-05-24 DIAGNOSIS — G20 Parkinson's disease: Secondary | ICD-10-CM | POA: Diagnosis not present

## 2017-05-24 DIAGNOSIS — R293 Abnormal posture: Secondary | ICD-10-CM | POA: Diagnosis not present

## 2017-05-24 DIAGNOSIS — R2689 Other abnormalities of gait and mobility: Secondary | ICD-10-CM | POA: Diagnosis not present

## 2017-05-26 DIAGNOSIS — G20 Parkinson's disease: Secondary | ICD-10-CM | POA: Diagnosis not present

## 2017-05-26 DIAGNOSIS — R279 Unspecified lack of coordination: Secondary | ICD-10-CM | POA: Diagnosis not present

## 2017-05-26 DIAGNOSIS — R293 Abnormal posture: Secondary | ICD-10-CM | POA: Diagnosis not present

## 2017-05-26 DIAGNOSIS — R2689 Other abnormalities of gait and mobility: Secondary | ICD-10-CM | POA: Diagnosis not present

## 2017-05-27 DIAGNOSIS — R2689 Other abnormalities of gait and mobility: Secondary | ICD-10-CM | POA: Diagnosis not present

## 2017-05-27 DIAGNOSIS — R293 Abnormal posture: Secondary | ICD-10-CM | POA: Diagnosis not present

## 2017-05-27 DIAGNOSIS — G20 Parkinson's disease: Secondary | ICD-10-CM | POA: Diagnosis not present

## 2017-05-27 DIAGNOSIS — R279 Unspecified lack of coordination: Secondary | ICD-10-CM | POA: Diagnosis not present

## 2017-05-28 DIAGNOSIS — I10 Essential (primary) hypertension: Secondary | ICD-10-CM | POA: Diagnosis not present

## 2017-05-28 DIAGNOSIS — G20 Parkinson's disease: Secondary | ICD-10-CM | POA: Diagnosis not present

## 2017-05-28 DIAGNOSIS — D638 Anemia in other chronic diseases classified elsewhere: Secondary | ICD-10-CM | POA: Diagnosis not present

## 2017-05-28 DIAGNOSIS — S41109A Unspecified open wound of unspecified upper arm, initial encounter: Secondary | ICD-10-CM | POA: Diagnosis not present

## 2017-05-28 DIAGNOSIS — R2689 Other abnormalities of gait and mobility: Secondary | ICD-10-CM | POA: Diagnosis not present

## 2017-05-28 DIAGNOSIS — E039 Hypothyroidism, unspecified: Secondary | ICD-10-CM | POA: Diagnosis not present

## 2017-05-28 DIAGNOSIS — R293 Abnormal posture: Secondary | ICD-10-CM | POA: Diagnosis not present

## 2017-05-28 DIAGNOSIS — R279 Unspecified lack of coordination: Secondary | ICD-10-CM | POA: Diagnosis not present

## 2017-05-29 DIAGNOSIS — R279 Unspecified lack of coordination: Secondary | ICD-10-CM | POA: Diagnosis not present

## 2017-05-29 DIAGNOSIS — R2689 Other abnormalities of gait and mobility: Secondary | ICD-10-CM | POA: Diagnosis not present

## 2017-05-29 DIAGNOSIS — R293 Abnormal posture: Secondary | ICD-10-CM | POA: Diagnosis not present

## 2017-05-29 DIAGNOSIS — G20 Parkinson's disease: Secondary | ICD-10-CM | POA: Diagnosis not present

## 2017-05-30 DIAGNOSIS — G20 Parkinson's disease: Secondary | ICD-10-CM | POA: Diagnosis not present

## 2017-05-30 DIAGNOSIS — R2689 Other abnormalities of gait and mobility: Secondary | ICD-10-CM | POA: Diagnosis not present

## 2017-05-30 DIAGNOSIS — R279 Unspecified lack of coordination: Secondary | ICD-10-CM | POA: Diagnosis not present

## 2017-05-30 DIAGNOSIS — R293 Abnormal posture: Secondary | ICD-10-CM | POA: Diagnosis not present

## 2017-05-31 DIAGNOSIS — R2689 Other abnormalities of gait and mobility: Secondary | ICD-10-CM | POA: Diagnosis not present

## 2017-05-31 DIAGNOSIS — G20 Parkinson's disease: Secondary | ICD-10-CM | POA: Diagnosis not present

## 2017-05-31 DIAGNOSIS — N390 Urinary tract infection, site not specified: Secondary | ICD-10-CM | POA: Diagnosis not present

## 2017-05-31 DIAGNOSIS — R279 Unspecified lack of coordination: Secondary | ICD-10-CM | POA: Diagnosis not present

## 2017-05-31 DIAGNOSIS — R293 Abnormal posture: Secondary | ICD-10-CM | POA: Diagnosis not present

## 2017-06-02 DIAGNOSIS — R293 Abnormal posture: Secondary | ICD-10-CM | POA: Diagnosis not present

## 2017-06-02 DIAGNOSIS — R2689 Other abnormalities of gait and mobility: Secondary | ICD-10-CM | POA: Diagnosis not present

## 2017-06-02 DIAGNOSIS — R279 Unspecified lack of coordination: Secondary | ICD-10-CM | POA: Diagnosis not present

## 2017-06-02 DIAGNOSIS — G20 Parkinson's disease: Secondary | ICD-10-CM | POA: Diagnosis not present

## 2017-06-03 DIAGNOSIS — R279 Unspecified lack of coordination: Secondary | ICD-10-CM | POA: Diagnosis not present

## 2017-06-03 DIAGNOSIS — R2689 Other abnormalities of gait and mobility: Secondary | ICD-10-CM | POA: Diagnosis not present

## 2017-06-03 DIAGNOSIS — G20 Parkinson's disease: Secondary | ICD-10-CM | POA: Diagnosis not present

## 2017-06-03 DIAGNOSIS — R293 Abnormal posture: Secondary | ICD-10-CM | POA: Diagnosis not present

## 2017-06-04 DIAGNOSIS — G20 Parkinson's disease: Secondary | ICD-10-CM | POA: Diagnosis not present

## 2017-06-04 DIAGNOSIS — R279 Unspecified lack of coordination: Secondary | ICD-10-CM | POA: Diagnosis not present

## 2017-06-04 DIAGNOSIS — R293 Abnormal posture: Secondary | ICD-10-CM | POA: Diagnosis not present

## 2017-06-04 DIAGNOSIS — R2689 Other abnormalities of gait and mobility: Secondary | ICD-10-CM | POA: Diagnosis not present

## 2017-06-05 DIAGNOSIS — R2689 Other abnormalities of gait and mobility: Secondary | ICD-10-CM | POA: Diagnosis not present

## 2017-06-05 DIAGNOSIS — G20 Parkinson's disease: Secondary | ICD-10-CM | POA: Diagnosis not present

## 2017-06-05 DIAGNOSIS — R279 Unspecified lack of coordination: Secondary | ICD-10-CM | POA: Diagnosis not present

## 2017-06-05 DIAGNOSIS — R293 Abnormal posture: Secondary | ICD-10-CM | POA: Diagnosis not present

## 2017-06-06 DIAGNOSIS — R293 Abnormal posture: Secondary | ICD-10-CM | POA: Diagnosis not present

## 2017-06-06 DIAGNOSIS — G20 Parkinson's disease: Secondary | ICD-10-CM | POA: Diagnosis not present

## 2017-06-06 DIAGNOSIS — R279 Unspecified lack of coordination: Secondary | ICD-10-CM | POA: Diagnosis not present

## 2017-06-06 DIAGNOSIS — R2689 Other abnormalities of gait and mobility: Secondary | ICD-10-CM | POA: Diagnosis not present

## 2017-06-09 DIAGNOSIS — R293 Abnormal posture: Secondary | ICD-10-CM | POA: Diagnosis not present

## 2017-06-09 DIAGNOSIS — G20 Parkinson's disease: Secondary | ICD-10-CM | POA: Diagnosis not present

## 2017-06-09 DIAGNOSIS — R2689 Other abnormalities of gait and mobility: Secondary | ICD-10-CM | POA: Diagnosis not present

## 2017-06-09 DIAGNOSIS — R279 Unspecified lack of coordination: Secondary | ICD-10-CM | POA: Diagnosis not present

## 2017-06-10 DIAGNOSIS — R293 Abnormal posture: Secondary | ICD-10-CM | POA: Diagnosis not present

## 2017-06-10 DIAGNOSIS — R2689 Other abnormalities of gait and mobility: Secondary | ICD-10-CM | POA: Diagnosis not present

## 2017-06-10 DIAGNOSIS — G20 Parkinson's disease: Secondary | ICD-10-CM | POA: Diagnosis not present

## 2017-06-10 DIAGNOSIS — R279 Unspecified lack of coordination: Secondary | ICD-10-CM | POA: Diagnosis not present

## 2017-06-11 DIAGNOSIS — R293 Abnormal posture: Secondary | ICD-10-CM | POA: Diagnosis not present

## 2017-06-11 DIAGNOSIS — R279 Unspecified lack of coordination: Secondary | ICD-10-CM | POA: Diagnosis not present

## 2017-06-11 DIAGNOSIS — R2689 Other abnormalities of gait and mobility: Secondary | ICD-10-CM | POA: Diagnosis not present

## 2017-06-11 DIAGNOSIS — G20 Parkinson's disease: Secondary | ICD-10-CM | POA: Diagnosis not present

## 2017-06-12 DIAGNOSIS — R293 Abnormal posture: Secondary | ICD-10-CM | POA: Diagnosis not present

## 2017-06-12 DIAGNOSIS — R279 Unspecified lack of coordination: Secondary | ICD-10-CM | POA: Diagnosis not present

## 2017-06-12 DIAGNOSIS — R2689 Other abnormalities of gait and mobility: Secondary | ICD-10-CM | POA: Diagnosis not present

## 2017-06-12 DIAGNOSIS — G20 Parkinson's disease: Secondary | ICD-10-CM | POA: Diagnosis not present

## 2017-06-13 DIAGNOSIS — G20 Parkinson's disease: Secondary | ICD-10-CM | POA: Diagnosis not present

## 2017-06-13 DIAGNOSIS — R293 Abnormal posture: Secondary | ICD-10-CM | POA: Diagnosis not present

## 2017-06-13 DIAGNOSIS — R2689 Other abnormalities of gait and mobility: Secondary | ICD-10-CM | POA: Diagnosis not present

## 2017-06-13 DIAGNOSIS — R279 Unspecified lack of coordination: Secondary | ICD-10-CM | POA: Diagnosis not present

## 2017-06-16 DIAGNOSIS — G20 Parkinson's disease: Secondary | ICD-10-CM | POA: Diagnosis not present

## 2017-06-16 DIAGNOSIS — R2689 Other abnormalities of gait and mobility: Secondary | ICD-10-CM | POA: Diagnosis not present

## 2017-06-16 DIAGNOSIS — R293 Abnormal posture: Secondary | ICD-10-CM | POA: Diagnosis not present

## 2017-06-16 DIAGNOSIS — R279 Unspecified lack of coordination: Secondary | ICD-10-CM | POA: Diagnosis not present

## 2017-06-17 DIAGNOSIS — G20 Parkinson's disease: Secondary | ICD-10-CM | POA: Diagnosis not present

## 2017-06-17 DIAGNOSIS — R279 Unspecified lack of coordination: Secondary | ICD-10-CM | POA: Diagnosis not present

## 2017-06-17 DIAGNOSIS — R2689 Other abnormalities of gait and mobility: Secondary | ICD-10-CM | POA: Diagnosis not present

## 2017-06-17 DIAGNOSIS — R293 Abnormal posture: Secondary | ICD-10-CM | POA: Diagnosis not present

## 2017-06-18 DIAGNOSIS — R279 Unspecified lack of coordination: Secondary | ICD-10-CM | POA: Diagnosis not present

## 2017-06-18 DIAGNOSIS — G20 Parkinson's disease: Secondary | ICD-10-CM | POA: Diagnosis not present

## 2017-06-18 DIAGNOSIS — R2689 Other abnormalities of gait and mobility: Secondary | ICD-10-CM | POA: Diagnosis not present

## 2017-06-18 DIAGNOSIS — R293 Abnormal posture: Secondary | ICD-10-CM | POA: Diagnosis not present

## 2017-06-19 DIAGNOSIS — R293 Abnormal posture: Secondary | ICD-10-CM | POA: Diagnosis not present

## 2017-06-19 DIAGNOSIS — R279 Unspecified lack of coordination: Secondary | ICD-10-CM | POA: Diagnosis not present

## 2017-06-19 DIAGNOSIS — G20 Parkinson's disease: Secondary | ICD-10-CM | POA: Diagnosis not present

## 2017-06-19 DIAGNOSIS — R2689 Other abnormalities of gait and mobility: Secondary | ICD-10-CM | POA: Diagnosis not present

## 2017-06-20 DIAGNOSIS — R2689 Other abnormalities of gait and mobility: Secondary | ICD-10-CM | POA: Diagnosis not present

## 2017-06-20 DIAGNOSIS — R293 Abnormal posture: Secondary | ICD-10-CM | POA: Diagnosis not present

## 2017-06-20 DIAGNOSIS — G20 Parkinson's disease: Secondary | ICD-10-CM | POA: Diagnosis not present

## 2017-06-20 DIAGNOSIS — R279 Unspecified lack of coordination: Secondary | ICD-10-CM | POA: Diagnosis not present

## 2017-06-23 DIAGNOSIS — R2689 Other abnormalities of gait and mobility: Secondary | ICD-10-CM | POA: Diagnosis not present

## 2017-06-23 DIAGNOSIS — R293 Abnormal posture: Secondary | ICD-10-CM | POA: Diagnosis not present

## 2017-06-23 DIAGNOSIS — R279 Unspecified lack of coordination: Secondary | ICD-10-CM | POA: Diagnosis not present

## 2017-06-23 DIAGNOSIS — G20 Parkinson's disease: Secondary | ICD-10-CM | POA: Diagnosis not present

## 2017-06-24 DIAGNOSIS — N390 Urinary tract infection, site not specified: Secondary | ICD-10-CM | POA: Diagnosis not present

## 2017-06-24 DIAGNOSIS — G20 Parkinson's disease: Secondary | ICD-10-CM | POA: Diagnosis not present

## 2017-06-24 DIAGNOSIS — R293 Abnormal posture: Secondary | ICD-10-CM | POA: Diagnosis not present

## 2017-06-24 DIAGNOSIS — R2689 Other abnormalities of gait and mobility: Secondary | ICD-10-CM | POA: Diagnosis not present

## 2017-06-24 DIAGNOSIS — R279 Unspecified lack of coordination: Secondary | ICD-10-CM | POA: Diagnosis not present

## 2017-06-25 DIAGNOSIS — R279 Unspecified lack of coordination: Secondary | ICD-10-CM | POA: Diagnosis not present

## 2017-06-25 DIAGNOSIS — I10 Essential (primary) hypertension: Secondary | ICD-10-CM | POA: Diagnosis not present

## 2017-06-25 DIAGNOSIS — G20 Parkinson's disease: Secondary | ICD-10-CM | POA: Diagnosis not present

## 2017-06-25 DIAGNOSIS — R2689 Other abnormalities of gait and mobility: Secondary | ICD-10-CM | POA: Diagnosis not present

## 2017-06-25 DIAGNOSIS — R293 Abnormal posture: Secondary | ICD-10-CM | POA: Diagnosis not present

## 2017-06-26 DIAGNOSIS — R2689 Other abnormalities of gait and mobility: Secondary | ICD-10-CM | POA: Diagnosis not present

## 2017-06-26 DIAGNOSIS — R293 Abnormal posture: Secondary | ICD-10-CM | POA: Diagnosis not present

## 2017-06-26 DIAGNOSIS — F0280 Dementia in other diseases classified elsewhere without behavioral disturbance: Secondary | ICD-10-CM | POA: Diagnosis not present

## 2017-06-26 DIAGNOSIS — F419 Anxiety disorder, unspecified: Secondary | ICD-10-CM | POA: Diagnosis not present

## 2017-06-26 DIAGNOSIS — R279 Unspecified lack of coordination: Secondary | ICD-10-CM | POA: Diagnosis not present

## 2017-06-26 DIAGNOSIS — G20 Parkinson's disease: Secondary | ICD-10-CM | POA: Diagnosis not present

## 2017-06-26 DIAGNOSIS — F339 Major depressive disorder, recurrent, unspecified: Secondary | ICD-10-CM | POA: Diagnosis not present

## 2017-06-27 DIAGNOSIS — R2689 Other abnormalities of gait and mobility: Secondary | ICD-10-CM | POA: Diagnosis not present

## 2017-06-27 DIAGNOSIS — R279 Unspecified lack of coordination: Secondary | ICD-10-CM | POA: Diagnosis not present

## 2017-06-27 DIAGNOSIS — R293 Abnormal posture: Secondary | ICD-10-CM | POA: Diagnosis not present

## 2017-06-27 DIAGNOSIS — G20 Parkinson's disease: Secondary | ICD-10-CM | POA: Diagnosis not present

## 2017-06-30 DIAGNOSIS — R2689 Other abnormalities of gait and mobility: Secondary | ICD-10-CM | POA: Diagnosis not present

## 2017-06-30 DIAGNOSIS — R293 Abnormal posture: Secondary | ICD-10-CM | POA: Diagnosis not present

## 2017-06-30 DIAGNOSIS — R279 Unspecified lack of coordination: Secondary | ICD-10-CM | POA: Diagnosis not present

## 2017-06-30 DIAGNOSIS — G20 Parkinson's disease: Secondary | ICD-10-CM | POA: Diagnosis not present

## 2017-07-01 DIAGNOSIS — R293 Abnormal posture: Secondary | ICD-10-CM | POA: Diagnosis not present

## 2017-07-01 DIAGNOSIS — G20 Parkinson's disease: Secondary | ICD-10-CM | POA: Diagnosis not present

## 2017-07-01 DIAGNOSIS — R2689 Other abnormalities of gait and mobility: Secondary | ICD-10-CM | POA: Diagnosis not present

## 2017-07-01 DIAGNOSIS — R279 Unspecified lack of coordination: Secondary | ICD-10-CM | POA: Diagnosis not present

## 2017-07-02 DIAGNOSIS — R293 Abnormal posture: Secondary | ICD-10-CM | POA: Diagnosis not present

## 2017-07-02 DIAGNOSIS — G20 Parkinson's disease: Secondary | ICD-10-CM | POA: Diagnosis not present

## 2017-07-02 DIAGNOSIS — R2689 Other abnormalities of gait and mobility: Secondary | ICD-10-CM | POA: Diagnosis not present

## 2017-07-02 DIAGNOSIS — R279 Unspecified lack of coordination: Secondary | ICD-10-CM | POA: Diagnosis not present

## 2017-07-03 DIAGNOSIS — R2689 Other abnormalities of gait and mobility: Secondary | ICD-10-CM | POA: Diagnosis not present

## 2017-07-03 DIAGNOSIS — G20 Parkinson's disease: Secondary | ICD-10-CM | POA: Diagnosis not present

## 2017-07-03 DIAGNOSIS — R279 Unspecified lack of coordination: Secondary | ICD-10-CM | POA: Diagnosis not present

## 2017-07-03 DIAGNOSIS — R293 Abnormal posture: Secondary | ICD-10-CM | POA: Diagnosis not present

## 2017-07-04 DIAGNOSIS — G20 Parkinson's disease: Secondary | ICD-10-CM | POA: Diagnosis not present

## 2017-07-04 DIAGNOSIS — R293 Abnormal posture: Secondary | ICD-10-CM | POA: Diagnosis not present

## 2017-07-04 DIAGNOSIS — R2689 Other abnormalities of gait and mobility: Secondary | ICD-10-CM | POA: Diagnosis not present

## 2017-07-04 DIAGNOSIS — R279 Unspecified lack of coordination: Secondary | ICD-10-CM | POA: Diagnosis not present

## 2017-07-07 DIAGNOSIS — R293 Abnormal posture: Secondary | ICD-10-CM | POA: Diagnosis not present

## 2017-07-07 DIAGNOSIS — R2689 Other abnormalities of gait and mobility: Secondary | ICD-10-CM | POA: Diagnosis not present

## 2017-07-07 DIAGNOSIS — R279 Unspecified lack of coordination: Secondary | ICD-10-CM | POA: Diagnosis not present

## 2017-07-07 DIAGNOSIS — G20 Parkinson's disease: Secondary | ICD-10-CM | POA: Diagnosis not present

## 2017-07-08 DIAGNOSIS — R279 Unspecified lack of coordination: Secondary | ICD-10-CM | POA: Diagnosis not present

## 2017-07-08 DIAGNOSIS — R293 Abnormal posture: Secondary | ICD-10-CM | POA: Diagnosis not present

## 2017-07-08 DIAGNOSIS — G20 Parkinson's disease: Secondary | ICD-10-CM | POA: Diagnosis not present

## 2017-07-08 DIAGNOSIS — R2689 Other abnormalities of gait and mobility: Secondary | ICD-10-CM | POA: Diagnosis not present

## 2017-07-09 DIAGNOSIS — R293 Abnormal posture: Secondary | ICD-10-CM | POA: Diagnosis not present

## 2017-07-09 DIAGNOSIS — G20 Parkinson's disease: Secondary | ICD-10-CM | POA: Diagnosis not present

## 2017-07-09 DIAGNOSIS — R279 Unspecified lack of coordination: Secondary | ICD-10-CM | POA: Diagnosis not present

## 2017-07-09 DIAGNOSIS — R2689 Other abnormalities of gait and mobility: Secondary | ICD-10-CM | POA: Diagnosis not present

## 2017-07-10 DIAGNOSIS — R2689 Other abnormalities of gait and mobility: Secondary | ICD-10-CM | POA: Diagnosis not present

## 2017-07-10 DIAGNOSIS — R293 Abnormal posture: Secondary | ICD-10-CM | POA: Diagnosis not present

## 2017-07-10 DIAGNOSIS — G20 Parkinson's disease: Secondary | ICD-10-CM | POA: Diagnosis not present

## 2017-07-10 DIAGNOSIS — R279 Unspecified lack of coordination: Secondary | ICD-10-CM | POA: Diagnosis not present

## 2017-07-11 DIAGNOSIS — R293 Abnormal posture: Secondary | ICD-10-CM | POA: Diagnosis not present

## 2017-07-11 DIAGNOSIS — R2689 Other abnormalities of gait and mobility: Secondary | ICD-10-CM | POA: Diagnosis not present

## 2017-07-11 DIAGNOSIS — R279 Unspecified lack of coordination: Secondary | ICD-10-CM | POA: Diagnosis not present

## 2017-07-11 DIAGNOSIS — G20 Parkinson's disease: Secondary | ICD-10-CM | POA: Diagnosis not present

## 2017-07-13 DIAGNOSIS — G20 Parkinson's disease: Secondary | ICD-10-CM | POA: Diagnosis not present

## 2017-07-13 DIAGNOSIS — R293 Abnormal posture: Secondary | ICD-10-CM | POA: Diagnosis not present

## 2017-07-13 DIAGNOSIS — R2689 Other abnormalities of gait and mobility: Secondary | ICD-10-CM | POA: Diagnosis not present

## 2017-07-13 DIAGNOSIS — R279 Unspecified lack of coordination: Secondary | ICD-10-CM | POA: Diagnosis not present

## 2017-07-14 DIAGNOSIS — G20 Parkinson's disease: Secondary | ICD-10-CM | POA: Diagnosis not present

## 2017-07-14 DIAGNOSIS — R293 Abnormal posture: Secondary | ICD-10-CM | POA: Diagnosis not present

## 2017-07-14 DIAGNOSIS — R2689 Other abnormalities of gait and mobility: Secondary | ICD-10-CM | POA: Diagnosis not present

## 2017-07-14 DIAGNOSIS — R279 Unspecified lack of coordination: Secondary | ICD-10-CM | POA: Diagnosis not present

## 2017-07-23 DIAGNOSIS — R293 Abnormal posture: Secondary | ICD-10-CM | POA: Diagnosis not present

## 2017-07-23 DIAGNOSIS — G20 Parkinson's disease: Secondary | ICD-10-CM | POA: Diagnosis not present

## 2017-07-23 DIAGNOSIS — Z66 Do not resuscitate: Secondary | ICD-10-CM | POA: Diagnosis not present

## 2017-07-23 DIAGNOSIS — R279 Unspecified lack of coordination: Secondary | ICD-10-CM | POA: Diagnosis not present

## 2017-07-23 DIAGNOSIS — F29 Unspecified psychosis not due to a substance or known physiological condition: Secondary | ICD-10-CM | POA: Diagnosis not present

## 2017-07-23 DIAGNOSIS — E039 Hypothyroidism, unspecified: Secondary | ICD-10-CM | POA: Diagnosis not present

## 2017-07-23 DIAGNOSIS — R2689 Other abnormalities of gait and mobility: Secondary | ICD-10-CM | POA: Diagnosis not present

## 2017-07-23 DIAGNOSIS — I10 Essential (primary) hypertension: Secondary | ICD-10-CM | POA: Diagnosis not present

## 2017-07-23 NOTE — Progress Notes (Addendum)
[facility]  PATIENT: [name] DOB: 1940-10-04   REASON FOR VISIT: Follow-up for Parkinson's disease, history of falls HISTORY FROM: Patient    HISTORY OF PRESENT ILLNESS:[name] is a 76 year old right-handed woman with an underlying complex medical history of hypertension, arthritis, anemia, cellulitis, chronic systolic heart failure, peptic ulcer disease, and chronic low back pain with history of T12 compression fracture, status post kyphoplasty in October 2010, who presents for follow-up consultation of her Parkinson's disease, complicated by motor complications, motor fluctuations and recurrent falls, deconditioning, Hx of cellulitis.The patient is accompanied by an aide from [facility] today. Of note, she no showed for an appointment on 12/30/2016. She is currently residing at [facility] rehabilitation. I last saw her on 02/07/2016, at which time she reported that the increase in Sinemet help with her freezing. She was taking one pill alternating with 1-1/2 pills, every 3 hours, starting at 6 AM and ending at 9 PM. She had a recent fall. Thankfully a family member was there. Her nephew and his wife check on her regularly, and her brother lives in [place], her other brother lives in [place] and usually supplies her with food for supper. She was diagnosed with cellulitis recently.   Today, 03/24/2017 (all dictated new, as well as above notes, some dictation done in note pad or Word, outside of chart, may appear as copied):  She reports no recent issues, takes her C/L, no recent falls. Of note, she had seen Cecille Rubin in the interim on 06/18/2016, on 08/13/2016 and also on 10/08/2016. She missed an appointment on 12/18/2016. She has had multiple hospital admissions including for cellulitis, congestive heart failure, and on 06/10/2016 for a fall where she was found down by a caregiver. She was discharged on 06/12/2016. She was in rehabilitation. She was seen in  the emergency room on 11/05/2016 secondary to hallucinations and delusions. She was transferred to [facility] behavioral health. She was also treated empirically for cellulitis exacerbation. She was seen in the emergency room on 01/18/2017 after a right hip injury, presumed fall, no fracture was detected. She is on generic Sinemet 1-1/2 pills 3 times a day, generic Mirapex 1 mg 3 times a day and now also onNuplazid, 17 mg, 2 pills daily. She reports that she was recently diagnosed with pneumonia and is on antibiotics, but I do not see any antibiotic listed on her medication list. UPDATE 11/1/2018CM [name], 76 year old female returns for follow-up with history of Parkinson's disease.  Which is complicated by motor fluctuations and recurrent falls due to deconditioning.  She is back at [facility] for rehab after sustaining another fall at home.  She is in a wheelchair today and she occasionally uses a walker.  She says she has been getting physical therapy she denies any hallucinations today.  She is currently on Sinemet 1-1/2 tablets 3 times daily along with Mirapex 1 mg 3 times daily.  She takes Seroquel at bedtime.  She returns for reevaluation  REVIEW OF SYSTEMS: Full 14 system review of systems performed and notable only for those listed, all others are neg:  Constitutional: Fatigue Cardiovascular: neg Ear/Nose/Throat: neg  Skin: neg Eyes: neg Respiratory: neg Gastroitestinal: neg  Hematology/Lymphatic: neg  Endocrine: neg Musculoskeletal: Joint pain, gait abnormality Allergy/Immunology: neg Neurological: neg Psychiatric: neg Sleep : neg   ALLERGIES: Allergies  Allergen Reactions  . Codeine Other (See Comments)    Unknown allergic reaction  . Penicillins Other (See Comments)    Has patient had a  PCN reaction causing immediate rash, facial/tongue/throat swelling, SOB or lightheadedness with hypotension: lightheadedness, passes out Has patient had a PCN reaction causing severe rash  involving mucus membranes or skin necrosis: No Has patient had a PCN reaction that required hospitalization No Has patient had a PCN reaction occurring within the last 10 years: No If all of the above answers are "NO", then may proceed with Cephalosporin use. Pt passed out in doctor's office afte    HOME MEDICATIONS: Outpatient Medications Prior to Visit  Medication Sig Dispense Refill  . acetaminophen (TYLENOL) 500 MG tablet Take 500 mg by mouth every 8 (eight) hours as needed.    Marland Kitchen aspirin EC 81 MG tablet Take 81 mg by mouth daily.    . carbidopa-levodopa (SINEMET IR) 25-100 MG tablet Take 1.5 tablets by mouth 3 (three) times daily. 9am, 3pm, 9pm    . docusate sodium (COLACE) 100 MG capsule Take 100 mg by mouth daily. Reported on 02/28/2016    . ferrous sulfate 325 (65 FE) MG tablet Take 325 mg by mouth daily.  0  . furosemide (LASIX) 20 MG tablet Take 20 mg by mouth daily.     Marland Kitchen latanoprost (XALATAN) 0.005 % ophthalmic solution Place 1 drop into the left eye daily at 6 PM.     . lisinopril (PRINIVIL,ZESTRIL) 2.5 MG tablet Take by mouth.    Marland Kitchen LORazepam (ATIVAN) 0.5 MG tablet Take 0.5 mg by mouth 2 (two) times daily.     . metoprolol tartrate (LOPRESSOR) 25 MG tablet Take 0.5 tablets (12.5 mg total) by mouth 2 (two) times daily. 30 tablet 3  . Multiple Vitamins-Minerals (MULTIVITAMIN WITH MINERALS) tablet Take 1 tablet by mouth daily.    Marland Kitchen Pimavanserin Tartrate (NUPLAZID) 17 MG TABS Take 2 tablets by mouth daily. 60 tablet 5  . Polyethyl Glycol-Propyl Glycol (SYSTANE OP) Place 1 drop into both eyes daily.     . pramipexole (MIRAPEX) 1 MG tablet Take 1 tablet (1 mg total) by mouth 3 (three) times daily. 270 tablet 1  . Skin Protectants, Misc. (EUCERIN) cream Apply topically.    . traZODone (DESYREL) 50 MG tablet Take by mouth. Takes 25mg  po BID and 50mg   Po qhs    . UNABLE TO FIND Med Name: Cartagen     No facility-administered medications prior to visit.     PAST MEDICAL HISTORY: Past  Medical History:  Diagnosis Date  . Acute on chronic systolic heart failure ([place])   . Anemia   . Anxiety   . Arthritis   . Cataract left  . Cellulitis and abscess of leg, except foot   . Cellulitis of lower leg 12/24/2011  . CHF (congestive heart failure) ([place])   . Chronic bronchitis ([place])    "usually get it q yr"  . Chronic systolic heart failure ([place])   . GERD (gastroesophageal reflux disease)   . Glaucoma left  . Helicobacter pylori (H. pylori)   . History of bleeding peptic ulcer   . History of blood transfusion    "don't remember why"  . Hypercholesterolemia   . Hypertension   . Hypotension, unspecified   . Hypothyroidism   . Parkinson's disease   . Pneumonia   . Sleep apnea    "had test years ago; insurance wouldn't pay for mask so I never had one" (04/27/2015)  . Stroke [facility]) X 3   "that  was the reason I got Parkinson's"    PAST SURGICAL HISTORY: Past Surgical History:  Procedure Laterality Date  .  BACK SURGERY    . ESOPHAGOGASTRODUODENOSCOPY  09/26/2011   Procedure: ESOPHAGOGASTRODUODENOSCOPY (EGD);  Surgeon: Zenovia Jarred, MD;  Location: [facility] ENDOSCOPY;  Service: Gastroenterology;  Laterality: N/A;  To be done at bedside.  . ESOPHAGOGASTRODUODENOSCOPY N/A 09/30/2013   Procedure: ESOPHAGOGASTRODUODENOSCOPY (EGD);  Surgeon: [name], MD;  Location: [place];  Service: Endoscopy;  Laterality: N/A;  . ESOPHAGOGASTRODUODENOSCOPY N/A 11/25/2013   Procedure: ESOPHAGOGASTRODUODENOSCOPY (EGD);  Surgeon: [name], MD;  Location: [facility] ENDOSCOPY;  Service: Endoscopy;  Laterality: N/A;  . HEMIARTHROPLASTY HIP Right 09/24/2007   Archie Endo 01/23/2011  . KYPHOPLASTY  06/2009   T12/notes 07/17/2009  . NISSEN FUNDOPLICATION     "had OR for GERD"  . TENDON REPAIR     Gluteus medius [facility] 01/23/2011  . THYROIDECTOMY    . TONSILLECTOMY      FAMILY HISTORY: Family History  Problem Relation Age of Onset  . GER disease Mother   . Heart disease Father     SOCIAL HISTORY: Social  History   Social History  . Marital status: Single    Spouse name: N/A  . Number of children: 0  . Years of education: 12   Occupational History  .      Retired   Social History Main Topics  . Smoking status: Never Smoker  . Smokeless tobacco: Never Used  . Alcohol use No  . Drug use: No  . Sexual activity: No   Other Topics Concern  . Not on file   Social History Narrative   Patient resides at [facility] rehab at this time. . Patient is retired. Patient has high school education.   Caffeine- one daily.   Right handed.     PHYSICAL EXAM  Vitals:   07/24/17 1033  BP: 102/61  Pulse: 74  Height: 5\' 4"  (1.626 m)   There is no height or weight on file to calculate BMI.  Generalized: Well developed, frail in appearance in no acute distress , well-groomed Head: normocephalic and atraumatic,. Oropharynx benign  Neck: Supple,   Cardiac: Regular rate rhythm, no murmur  Musculoskeletal: Arthritic changes noted in the hands Neurological examination   Mentation: Alert oriented to time, place, history taking. Attention span and concentration appropriate. Recent and remote memory intact impaired.  Follows all commands speech hypophonic and language fluent.   Cranial nerve II-XII: Pupils were equal round reactive to light extraocular movements were full, limitation to upper gaze, mild decrease in blink rate visual field were full on confrontational test. Facial sensation and strength were normal. hearing was intact to finger rubbing bilaterally. Uvula tongue midline. head turning and shoulder shrug were normal and symmetric.Tongue protrusion into cheek strength was normal.  No drooling Motor: 4/5 strength in the upper and lower extremities , no dyskinesias, no significant resting tremor, mild bradykinesia Sensory: normal and symmetric to light touch,  Coordination: finger-nose-finger, no dysmetria Reflexes: 1+ upper lower and symmetric, plantar responses were flexor  bilaterally. Gait and Station: In wheelchair not ambulated due to safety concerns DIAGNOSTIC DATA (LABS, IMAGING, TESTING) - I reviewed patient records, labs, notes, testing and imaging myself where available.  Lab Results  Component Value Date   WBC 8.6 11/05/2016   HGB 12.9 11/05/2016   HCT 39.3 11/05/2016   MCV 86.0 11/05/2016   PLT 126 (L) 11/05/2016      Component Value Date/Time   NA 141 11/05/2016 0210   NA 142 02/15/2016 1506   K 4.0 11/05/2016 0210   CL 107 11/05/2016 0210   CO2  27 11/05/2016 0210   GLUCOSE 113 (H) 11/05/2016 0210   BUN 19 11/05/2016 0210   BUN 21 02/15/2016 1506   CREATININE 0.83 11/05/2016 0210   CREATININE 1.68 (H) 06/06/2016 0935   CALCIUM 8.9 11/05/2016 0210   PROT 6.4 (L) 11/05/2016 0210   PROT 5.8 (L) 04/03/2015 1350   ALBUMIN 3.7 11/05/2016 0210   ALBUMIN 3.8 04/03/2015 1350   AST 19 11/05/2016 0210   ALT <5 (L) 11/05/2016 0210   ALKPHOS 58 11/05/2016 0210   BILITOT 0.6 11/05/2016 0210   BILITOT 0.4 04/03/2015 1350   GFRNONAA >60 11/05/2016 0210   GFRAA >60 11/05/2016 0210   Lab Results  Component Value Date   CHOL 159 12/11/2011   HDL 72 12/11/2011   LDLCALC 78 12/11/2011   TRIG 44 12/11/2011   CHOLHDL 2.2 12/11/2011    Lab Results  Component Value Date   VITAMINB12 458 04/18/2014   Lab Results  Component Value Date   TSH 0.762 05/25/2016      ASSESSMENT AND PLAN [name] is a very pleasant 76 year old female with an underlying complex medical history of hypertension, arthritis, anemia, cellulitis, chronic systolic heart failureand cor pulmonale, peptic ulcer disease, and chronic low back pain with history of T12 compression fracture, status post kyphoplasty in October 2010, who presents for follow-up consultation of her parkinsonismwith Sx dating back to 1999.Her history and physical exam are in keeping with, left-sided predominant PD, complicated by dyskinesias, hallucinations, overall frailty, lower extremity  swelling, cellulitis, freezing and recurrent falls and overall deconditioning. She has had multiple hospital admissions and inpatient rehabilitation as well as emergency room visits, as well as recent behavioral admission. She was not able to tolerate amantadine and we reduced her Mirapex d/t LE swelling, this appears to have improved some. She is on Sinemet 1-1/2 pills tid. Her  visual hallucinations have improved  Nuplazid 34 mg daily. She is advised to continue with her current medication regimen. I suggested a four-month checkup with Dr. Rexene Alberts. Dennie Bible, [facility], [facility], APRN  [facility] [address] [place], [place] [address] [phone]  I reviewed the above note and documentation by the Nurse Practitioner and agree with the history, physical exam, assessment and plan as outlined above. I was immediately available for face-to-face consultation. Star Age, MD, PhD [facility])

## 2017-07-24 ENCOUNTER — Encounter: Payer: Self-pay | Admitting: Nurse Practitioner

## 2017-07-24 ENCOUNTER — Ambulatory Visit (INDEPENDENT_AMBULATORY_CARE_PROVIDER_SITE_OTHER): Payer: Medicare Other | Admitting: Nurse Practitioner

## 2017-07-24 VITALS — BP 102/61 | HR 74 | Ht 64.0 in

## 2017-07-24 DIAGNOSIS — G20 Parkinson's disease: Secondary | ICD-10-CM

## 2017-07-24 DIAGNOSIS — R269 Unspecified abnormalities of gait and mobility: Secondary | ICD-10-CM | POA: Diagnosis not present

## 2017-07-24 DIAGNOSIS — R5381 Other malaise: Secondary | ICD-10-CM | POA: Diagnosis not present

## 2017-07-24 NOTE — Patient Instructions (Signed)
Per skilled nursing sheet 

## 2017-07-28 ENCOUNTER — Ambulatory Visit (INDEPENDENT_AMBULATORY_CARE_PROVIDER_SITE_OTHER): Payer: Medicare Other | Admitting: Podiatry

## 2017-07-28 ENCOUNTER — Encounter: Payer: Self-pay | Admitting: Podiatry

## 2017-07-28 DIAGNOSIS — M79675 Pain in left toe(s): Secondary | ICD-10-CM | POA: Diagnosis not present

## 2017-07-28 DIAGNOSIS — B351 Tinea unguium: Secondary | ICD-10-CM | POA: Diagnosis not present

## 2017-07-28 DIAGNOSIS — M79674 Pain in right toe(s): Secondary | ICD-10-CM | POA: Diagnosis not present

## 2017-07-29 NOTE — Progress Notes (Signed)
Patient ID: RHEA KAELIN, female   DOB: 17-May-1941, 76 y.o.   MRN: [id number]    Subjective: This patient presents complaining of uncomfortable toenails when wearing shoes and requests toenail debridement. Patient has Parkinson's disease and is able to transfer from wheelchair to treatment chair Patient's caregivers present to treatment room today  Objective:  Orientated 3  Vascular: DP and PT pulses 2/4 bilaterally Capillary reflex immediate bilaterally No calf edema or calf tenderness bilaterally  Neurological: Sensation to 10 g monofilament wire intact 4/5 bilaterally Vibratory sensation nonreactive bilaterally Ankle reflex equal reactive bilaterally  Dermatological Erythema pretibial lower legs bilaterally no warmth or active drainage noted in the lower legs bilaterally No active drainage noted from erythematous areas lower legs The toenails are elongated, deformed, discolored and tender to direct palpation 6-10 Atrophic skin with absent hair growth bilaterally  Musculoskeletal: Patient is able to transfer from wheelchair to treatment chair HAV bilaterally Dorsi flexion, plantar flexion 5/5 bilaterally  Assessment: 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

## 2017-08-07 DIAGNOSIS — F329 Major depressive disorder, single episode, unspecified: Secondary | ICD-10-CM | POA: Diagnosis not present

## 2017-08-07 DIAGNOSIS — F411 Generalized anxiety disorder: Secondary | ICD-10-CM | POA: Diagnosis not present

## 2017-08-07 DIAGNOSIS — F0391 Unspecified dementia with behavioral disturbance: Secondary | ICD-10-CM | POA: Diagnosis not present

## 2017-08-07 DIAGNOSIS — G20 Parkinson's disease: Secondary | ICD-10-CM | POA: Diagnosis not present

## 2017-08-12 DIAGNOSIS — R41841 Cognitive communication deficit: Secondary | ICD-10-CM | POA: Diagnosis not present

## 2017-08-12 DIAGNOSIS — R278 Other lack of coordination: Secondary | ICD-10-CM | POA: Diagnosis not present

## 2017-08-12 DIAGNOSIS — N390 Urinary tract infection, site not specified: Secondary | ICD-10-CM | POA: Diagnosis not present

## 2017-08-12 DIAGNOSIS — G20 Parkinson's disease: Secondary | ICD-10-CM | POA: Diagnosis not present

## 2017-08-12 DIAGNOSIS — R293 Abnormal posture: Secondary | ICD-10-CM | POA: Diagnosis not present

## 2017-08-13 DIAGNOSIS — R41841 Cognitive communication deficit: Secondary | ICD-10-CM | POA: Diagnosis not present

## 2017-08-13 DIAGNOSIS — G20 Parkinson's disease: Secondary | ICD-10-CM | POA: Diagnosis not present

## 2017-08-13 DIAGNOSIS — R293 Abnormal posture: Secondary | ICD-10-CM | POA: Diagnosis not present

## 2017-08-13 DIAGNOSIS — R278 Other lack of coordination: Secondary | ICD-10-CM | POA: Diagnosis not present

## 2017-08-14 DIAGNOSIS — R278 Other lack of coordination: Secondary | ICD-10-CM | POA: Diagnosis not present

## 2017-08-14 DIAGNOSIS — R41841 Cognitive communication deficit: Secondary | ICD-10-CM | POA: Diagnosis not present

## 2017-08-14 DIAGNOSIS — R293 Abnormal posture: Secondary | ICD-10-CM | POA: Diagnosis not present

## 2017-08-14 DIAGNOSIS — G20 Parkinson's disease: Secondary | ICD-10-CM | POA: Diagnosis not present

## 2017-08-15 DIAGNOSIS — G20 Parkinson's disease: Secondary | ICD-10-CM | POA: Diagnosis not present

## 2017-08-15 DIAGNOSIS — R278 Other lack of coordination: Secondary | ICD-10-CM | POA: Diagnosis not present

## 2017-08-15 DIAGNOSIS — R293 Abnormal posture: Secondary | ICD-10-CM | POA: Diagnosis not present

## 2017-08-15 DIAGNOSIS — R41841 Cognitive communication deficit: Secondary | ICD-10-CM | POA: Diagnosis not present

## 2017-08-18 DIAGNOSIS — G20 Parkinson's disease: Secondary | ICD-10-CM | POA: Diagnosis not present

## 2017-08-18 DIAGNOSIS — R293 Abnormal posture: Secondary | ICD-10-CM | POA: Diagnosis not present

## 2017-08-18 DIAGNOSIS — R278 Other lack of coordination: Secondary | ICD-10-CM | POA: Diagnosis not present

## 2017-08-18 DIAGNOSIS — R41841 Cognitive communication deficit: Secondary | ICD-10-CM | POA: Diagnosis not present

## 2017-08-19 DIAGNOSIS — R278 Other lack of coordination: Secondary | ICD-10-CM | POA: Diagnosis not present

## 2017-08-19 DIAGNOSIS — R293 Abnormal posture: Secondary | ICD-10-CM | POA: Diagnosis not present

## 2017-08-19 DIAGNOSIS — G20 Parkinson's disease: Secondary | ICD-10-CM | POA: Diagnosis not present

## 2017-08-19 DIAGNOSIS — R41841 Cognitive communication deficit: Secondary | ICD-10-CM | POA: Diagnosis not present

## 2017-08-20 DIAGNOSIS — G20 Parkinson's disease: Secondary | ICD-10-CM | POA: Diagnosis not present

## 2017-08-20 DIAGNOSIS — R41841 Cognitive communication deficit: Secondary | ICD-10-CM | POA: Diagnosis not present

## 2017-08-20 DIAGNOSIS — R293 Abnormal posture: Secondary | ICD-10-CM | POA: Diagnosis not present

## 2017-08-20 DIAGNOSIS — R278 Other lack of coordination: Secondary | ICD-10-CM | POA: Diagnosis not present

## 2017-08-21 DIAGNOSIS — R293 Abnormal posture: Secondary | ICD-10-CM | POA: Diagnosis not present

## 2017-08-21 DIAGNOSIS — G20 Parkinson's disease: Secondary | ICD-10-CM | POA: Diagnosis not present

## 2017-08-21 DIAGNOSIS — R41841 Cognitive communication deficit: Secondary | ICD-10-CM | POA: Diagnosis not present

## 2017-08-21 DIAGNOSIS — R278 Other lack of coordination: Secondary | ICD-10-CM | POA: Diagnosis not present

## 2017-08-22 DIAGNOSIS — R293 Abnormal posture: Secondary | ICD-10-CM | POA: Diagnosis not present

## 2017-08-22 DIAGNOSIS — R278 Other lack of coordination: Secondary | ICD-10-CM | POA: Diagnosis not present

## 2017-08-22 DIAGNOSIS — Z23 Encounter for immunization: Secondary | ICD-10-CM | POA: Diagnosis not present

## 2017-08-22 DIAGNOSIS — R41841 Cognitive communication deficit: Secondary | ICD-10-CM | POA: Diagnosis not present

## 2017-08-22 DIAGNOSIS — G20 Parkinson's disease: Secondary | ICD-10-CM | POA: Diagnosis not present

## 2017-08-25 DIAGNOSIS — G20 Parkinson's disease: Secondary | ICD-10-CM | POA: Diagnosis not present

## 2017-08-25 DIAGNOSIS — R293 Abnormal posture: Secondary | ICD-10-CM | POA: Diagnosis not present

## 2017-08-25 DIAGNOSIS — R278 Other lack of coordination: Secondary | ICD-10-CM | POA: Diagnosis not present

## 2017-08-25 DIAGNOSIS — R41841 Cognitive communication deficit: Secondary | ICD-10-CM | POA: Diagnosis not present

## 2017-08-26 DIAGNOSIS — R41841 Cognitive communication deficit: Secondary | ICD-10-CM | POA: Diagnosis not present

## 2017-08-26 DIAGNOSIS — G20 Parkinson's disease: Secondary | ICD-10-CM | POA: Diagnosis not present

## 2017-08-26 DIAGNOSIS — R278 Other lack of coordination: Secondary | ICD-10-CM | POA: Diagnosis not present

## 2017-08-26 DIAGNOSIS — R293 Abnormal posture: Secondary | ICD-10-CM | POA: Diagnosis not present

## 2017-08-27 DIAGNOSIS — G20 Parkinson's disease: Secondary | ICD-10-CM | POA: Diagnosis not present

## 2017-08-27 DIAGNOSIS — R278 Other lack of coordination: Secondary | ICD-10-CM | POA: Diagnosis not present

## 2017-08-27 DIAGNOSIS — R41841 Cognitive communication deficit: Secondary | ICD-10-CM | POA: Diagnosis not present

## 2017-08-27 DIAGNOSIS — R293 Abnormal posture: Secondary | ICD-10-CM | POA: Diagnosis not present

## 2017-08-28 DIAGNOSIS — G20 Parkinson's disease: Secondary | ICD-10-CM | POA: Diagnosis not present

## 2017-08-28 DIAGNOSIS — R293 Abnormal posture: Secondary | ICD-10-CM | POA: Diagnosis not present

## 2017-08-28 DIAGNOSIS — G4700 Insomnia, unspecified: Secondary | ICD-10-CM | POA: Diagnosis not present

## 2017-08-28 DIAGNOSIS — F329 Major depressive disorder, single episode, unspecified: Secondary | ICD-10-CM | POA: Diagnosis not present

## 2017-08-28 DIAGNOSIS — F411 Generalized anxiety disorder: Secondary | ICD-10-CM | POA: Diagnosis not present

## 2017-08-28 DIAGNOSIS — R41841 Cognitive communication deficit: Secondary | ICD-10-CM | POA: Diagnosis not present

## 2017-08-28 DIAGNOSIS — R278 Other lack of coordination: Secondary | ICD-10-CM | POA: Diagnosis not present

## 2017-08-29 ENCOUNTER — Telehealth: Payer: Self-pay

## 2017-08-29 DIAGNOSIS — R278 Other lack of coordination: Secondary | ICD-10-CM | POA: Diagnosis not present

## 2017-08-29 DIAGNOSIS — R293 Abnormal posture: Secondary | ICD-10-CM | POA: Diagnosis not present

## 2017-08-29 DIAGNOSIS — R41841 Cognitive communication deficit: Secondary | ICD-10-CM | POA: Diagnosis not present

## 2017-08-29 DIAGNOSIS — G20 Parkinson's disease: Secondary | ICD-10-CM | POA: Diagnosis not present

## 2017-08-29 NOTE — Telephone Encounter (Signed)
Patient is currently at [facility] since May 2018. Patient is taking Nuplazid and the physician on site will not discontinue this medication because Dr.Reed had her on it prior to admission.  Patient's brother (POA) was asked to pay 9000 dollars for this medication and he would like for her to be taken off medication.   Per Dr.Reed patient is not currently under her care and the physician at [facility] will need to address patient discontinuing medication and provider alternative.

## 2017-08-29 NOTE — Telephone Encounter (Signed)
Patient's brother informed and given copy of this encounter

## 2017-08-31 DIAGNOSIS — R293 Abnormal posture: Secondary | ICD-10-CM | POA: Diagnosis not present

## 2017-08-31 DIAGNOSIS — G20 Parkinson's disease: Secondary | ICD-10-CM | POA: Diagnosis not present

## 2017-08-31 DIAGNOSIS — R278 Other lack of coordination: Secondary | ICD-10-CM | POA: Diagnosis not present

## 2017-08-31 DIAGNOSIS — R41841 Cognitive communication deficit: Secondary | ICD-10-CM | POA: Diagnosis not present

## 2017-09-01 DIAGNOSIS — R278 Other lack of coordination: Secondary | ICD-10-CM | POA: Diagnosis not present

## 2017-09-01 DIAGNOSIS — R293 Abnormal posture: Secondary | ICD-10-CM | POA: Diagnosis not present

## 2017-09-01 DIAGNOSIS — R41841 Cognitive communication deficit: Secondary | ICD-10-CM | POA: Diagnosis not present

## 2017-09-01 DIAGNOSIS — G20 Parkinson's disease: Secondary | ICD-10-CM | POA: Diagnosis not present

## 2017-09-02 DIAGNOSIS — R278 Other lack of coordination: Secondary | ICD-10-CM | POA: Diagnosis not present

## 2017-09-02 DIAGNOSIS — R41841 Cognitive communication deficit: Secondary | ICD-10-CM | POA: Diagnosis not present

## 2017-09-02 DIAGNOSIS — G20 Parkinson's disease: Secondary | ICD-10-CM | POA: Diagnosis not present

## 2017-09-02 DIAGNOSIS — R293 Abnormal posture: Secondary | ICD-10-CM | POA: Diagnosis not present

## 2017-09-03 DIAGNOSIS — R278 Other lack of coordination: Secondary | ICD-10-CM | POA: Diagnosis not present

## 2017-09-03 DIAGNOSIS — R293 Abnormal posture: Secondary | ICD-10-CM | POA: Diagnosis not present

## 2017-09-03 DIAGNOSIS — G20 Parkinson's disease: Secondary | ICD-10-CM | POA: Diagnosis not present

## 2017-09-03 DIAGNOSIS — R41841 Cognitive communication deficit: Secondary | ICD-10-CM | POA: Diagnosis not present

## 2017-09-04 DIAGNOSIS — G20 Parkinson's disease: Secondary | ICD-10-CM | POA: Diagnosis not present

## 2017-09-04 DIAGNOSIS — R278 Other lack of coordination: Secondary | ICD-10-CM | POA: Diagnosis not present

## 2017-09-04 DIAGNOSIS — R293 Abnormal posture: Secondary | ICD-10-CM | POA: Diagnosis not present

## 2017-09-04 DIAGNOSIS — R41841 Cognitive communication deficit: Secondary | ICD-10-CM | POA: Diagnosis not present

## 2017-09-05 DIAGNOSIS — R293 Abnormal posture: Secondary | ICD-10-CM | POA: Diagnosis not present

## 2017-09-05 DIAGNOSIS — R41841 Cognitive communication deficit: Secondary | ICD-10-CM | POA: Diagnosis not present

## 2017-09-05 DIAGNOSIS — R278 Other lack of coordination: Secondary | ICD-10-CM | POA: Diagnosis not present

## 2017-09-05 DIAGNOSIS — G20 Parkinson's disease: Secondary | ICD-10-CM | POA: Diagnosis not present

## 2017-09-08 DIAGNOSIS — R41841 Cognitive communication deficit: Secondary | ICD-10-CM | POA: Diagnosis not present

## 2017-09-08 DIAGNOSIS — R278 Other lack of coordination: Secondary | ICD-10-CM | POA: Diagnosis not present

## 2017-09-08 DIAGNOSIS — R293 Abnormal posture: Secondary | ICD-10-CM | POA: Diagnosis not present

## 2017-09-08 DIAGNOSIS — G20 Parkinson's disease: Secondary | ICD-10-CM | POA: Diagnosis not present

## 2017-09-09 DIAGNOSIS — R293 Abnormal posture: Secondary | ICD-10-CM | POA: Diagnosis not present

## 2017-09-09 DIAGNOSIS — R278 Other lack of coordination: Secondary | ICD-10-CM | POA: Diagnosis not present

## 2017-09-09 DIAGNOSIS — R41841 Cognitive communication deficit: Secondary | ICD-10-CM | POA: Diagnosis not present

## 2017-09-09 DIAGNOSIS — G20 Parkinson's disease: Secondary | ICD-10-CM | POA: Diagnosis not present

## 2017-09-10 DIAGNOSIS — R41841 Cognitive communication deficit: Secondary | ICD-10-CM | POA: Diagnosis not present

## 2017-09-10 DIAGNOSIS — G20 Parkinson's disease: Secondary | ICD-10-CM | POA: Diagnosis not present

## 2017-09-10 DIAGNOSIS — R293 Abnormal posture: Secondary | ICD-10-CM | POA: Diagnosis not present

## 2017-09-10 DIAGNOSIS — R278 Other lack of coordination: Secondary | ICD-10-CM | POA: Diagnosis not present

## 2017-09-11 DIAGNOSIS — R41841 Cognitive communication deficit: Secondary | ICD-10-CM | POA: Diagnosis not present

## 2017-09-11 DIAGNOSIS — G20 Parkinson's disease: Secondary | ICD-10-CM | POA: Diagnosis not present

## 2017-09-11 DIAGNOSIS — R293 Abnormal posture: Secondary | ICD-10-CM | POA: Diagnosis not present

## 2017-09-11 DIAGNOSIS — G4700 Insomnia, unspecified: Secondary | ICD-10-CM | POA: Diagnosis not present

## 2017-09-11 DIAGNOSIS — F329 Major depressive disorder, single episode, unspecified: Secondary | ICD-10-CM | POA: Diagnosis not present

## 2017-09-11 DIAGNOSIS — R278 Other lack of coordination: Secondary | ICD-10-CM | POA: Diagnosis not present

## 2017-09-11 DIAGNOSIS — F411 Generalized anxiety disorder: Secondary | ICD-10-CM | POA: Diagnosis not present

## 2017-09-12 DIAGNOSIS — G20 Parkinson's disease: Secondary | ICD-10-CM | POA: Diagnosis not present

## 2017-09-12 DIAGNOSIS — R41841 Cognitive communication deficit: Secondary | ICD-10-CM | POA: Diagnosis not present

## 2017-09-12 DIAGNOSIS — R278 Other lack of coordination: Secondary | ICD-10-CM | POA: Diagnosis not present

## 2017-09-12 DIAGNOSIS — R293 Abnormal posture: Secondary | ICD-10-CM | POA: Diagnosis not present

## 2017-09-14 DIAGNOSIS — G20 Parkinson's disease: Secondary | ICD-10-CM | POA: Diagnosis not present

## 2017-09-14 DIAGNOSIS — R41841 Cognitive communication deficit: Secondary | ICD-10-CM | POA: Diagnosis not present

## 2017-09-14 DIAGNOSIS — R293 Abnormal posture: Secondary | ICD-10-CM | POA: Diagnosis not present

## 2017-09-14 DIAGNOSIS — R278 Other lack of coordination: Secondary | ICD-10-CM | POA: Diagnosis not present

## 2017-09-15 DIAGNOSIS — R41841 Cognitive communication deficit: Secondary | ICD-10-CM | POA: Diagnosis not present

## 2017-09-15 DIAGNOSIS — R278 Other lack of coordination: Secondary | ICD-10-CM | POA: Diagnosis not present

## 2017-09-15 DIAGNOSIS — G20 Parkinson's disease: Secondary | ICD-10-CM | POA: Diagnosis not present

## 2017-09-15 DIAGNOSIS — R293 Abnormal posture: Secondary | ICD-10-CM | POA: Diagnosis not present

## 2017-09-17 DIAGNOSIS — G20 Parkinson's disease: Secondary | ICD-10-CM | POA: Diagnosis not present

## 2017-09-17 DIAGNOSIS — R293 Abnormal posture: Secondary | ICD-10-CM | POA: Diagnosis not present

## 2017-09-17 DIAGNOSIS — R41841 Cognitive communication deficit: Secondary | ICD-10-CM | POA: Diagnosis not present

## 2017-09-17 DIAGNOSIS — R278 Other lack of coordination: Secondary | ICD-10-CM | POA: Diagnosis not present

## 2017-09-18 DIAGNOSIS — R278 Other lack of coordination: Secondary | ICD-10-CM | POA: Diagnosis not present

## 2017-09-18 DIAGNOSIS — R41841 Cognitive communication deficit: Secondary | ICD-10-CM | POA: Diagnosis not present

## 2017-09-18 DIAGNOSIS — R293 Abnormal posture: Secondary | ICD-10-CM | POA: Diagnosis not present

## 2017-09-18 DIAGNOSIS — G20 Parkinson's disease: Secondary | ICD-10-CM | POA: Diagnosis not present

## 2017-09-19 DIAGNOSIS — R278 Other lack of coordination: Secondary | ICD-10-CM | POA: Diagnosis not present

## 2017-09-19 DIAGNOSIS — G20 Parkinson's disease: Secondary | ICD-10-CM | POA: Diagnosis not present

## 2017-09-19 DIAGNOSIS — R293 Abnormal posture: Secondary | ICD-10-CM | POA: Diagnosis not present

## 2017-09-19 DIAGNOSIS — R41841 Cognitive communication deficit: Secondary | ICD-10-CM | POA: Diagnosis not present

## 2017-09-22 DIAGNOSIS — R278 Other lack of coordination: Secondary | ICD-10-CM | POA: Diagnosis not present

## 2017-09-22 DIAGNOSIS — G20 Parkinson's disease: Secondary | ICD-10-CM | POA: Diagnosis not present

## 2017-09-22 DIAGNOSIS — R293 Abnormal posture: Secondary | ICD-10-CM | POA: Diagnosis not present

## 2017-09-22 DIAGNOSIS — R41841 Cognitive communication deficit: Secondary | ICD-10-CM | POA: Diagnosis not present

## 2017-09-23 DIAGNOSIS — G20 Parkinson's disease: Secondary | ICD-10-CM | POA: Diagnosis not present

## 2017-09-23 DIAGNOSIS — R1312 Dysphagia, oropharyngeal phase: Secondary | ICD-10-CM | POA: Diagnosis not present

## 2017-09-23 DIAGNOSIS — R488 Other symbolic dysfunctions: Secondary | ICD-10-CM | POA: Diagnosis not present

## 2017-09-23 DIAGNOSIS — R293 Abnormal posture: Secondary | ICD-10-CM | POA: Diagnosis not present

## 2017-09-23 DIAGNOSIS — R278 Other lack of coordination: Secondary | ICD-10-CM | POA: Diagnosis not present

## 2017-09-24 DIAGNOSIS — R1312 Dysphagia, oropharyngeal phase: Secondary | ICD-10-CM | POA: Diagnosis not present

## 2017-09-24 DIAGNOSIS — G20 Parkinson's disease: Secondary | ICD-10-CM | POA: Diagnosis not present

## 2017-09-24 DIAGNOSIS — R278 Other lack of coordination: Secondary | ICD-10-CM | POA: Diagnosis not present

## 2017-09-24 DIAGNOSIS — R488 Other symbolic dysfunctions: Secondary | ICD-10-CM | POA: Diagnosis not present

## 2017-09-24 DIAGNOSIS — R293 Abnormal posture: Secondary | ICD-10-CM | POA: Diagnosis not present

## 2017-09-25 DIAGNOSIS — R1312 Dysphagia, oropharyngeal phase: Secondary | ICD-10-CM | POA: Diagnosis not present

## 2017-09-25 DIAGNOSIS — R488 Other symbolic dysfunctions: Secondary | ICD-10-CM | POA: Diagnosis not present

## 2017-09-25 DIAGNOSIS — R293 Abnormal posture: Secondary | ICD-10-CM | POA: Diagnosis not present

## 2017-09-25 DIAGNOSIS — G20 Parkinson's disease: Secondary | ICD-10-CM | POA: Diagnosis not present

## 2017-09-25 DIAGNOSIS — R278 Other lack of coordination: Secondary | ICD-10-CM | POA: Diagnosis not present

## 2017-09-26 DIAGNOSIS — G20 Parkinson's disease: Secondary | ICD-10-CM | POA: Diagnosis not present

## 2017-09-26 DIAGNOSIS — R1312 Dysphagia, oropharyngeal phase: Secondary | ICD-10-CM | POA: Diagnosis not present

## 2017-09-26 DIAGNOSIS — R488 Other symbolic dysfunctions: Secondary | ICD-10-CM | POA: Diagnosis not present

## 2017-09-26 DIAGNOSIS — R278 Other lack of coordination: Secondary | ICD-10-CM | POA: Diagnosis not present

## 2017-09-26 DIAGNOSIS — R293 Abnormal posture: Secondary | ICD-10-CM | POA: Diagnosis not present

## 2017-09-29 DIAGNOSIS — R488 Other symbolic dysfunctions: Secondary | ICD-10-CM | POA: Diagnosis not present

## 2017-09-29 DIAGNOSIS — G20 Parkinson's disease: Secondary | ICD-10-CM | POA: Diagnosis not present

## 2017-09-29 DIAGNOSIS — R1312 Dysphagia, oropharyngeal phase: Secondary | ICD-10-CM | POA: Diagnosis not present

## 2017-09-29 DIAGNOSIS — R278 Other lack of coordination: Secondary | ICD-10-CM | POA: Diagnosis not present

## 2017-09-29 DIAGNOSIS — R293 Abnormal posture: Secondary | ICD-10-CM | POA: Diagnosis not present

## 2017-09-30 DIAGNOSIS — G20 Parkinson's disease: Secondary | ICD-10-CM | POA: Diagnosis not present

## 2017-09-30 DIAGNOSIS — R1312 Dysphagia, oropharyngeal phase: Secondary | ICD-10-CM | POA: Diagnosis not present

## 2017-09-30 DIAGNOSIS — R278 Other lack of coordination: Secondary | ICD-10-CM | POA: Diagnosis not present

## 2017-09-30 DIAGNOSIS — R293 Abnormal posture: Secondary | ICD-10-CM | POA: Diagnosis not present

## 2017-09-30 DIAGNOSIS — R488 Other symbolic dysfunctions: Secondary | ICD-10-CM | POA: Diagnosis not present

## 2017-10-01 DIAGNOSIS — I519 Heart disease, unspecified: Secondary | ICD-10-CM | POA: Diagnosis not present

## 2017-10-01 DIAGNOSIS — Z66 Do not resuscitate: Secondary | ICD-10-CM | POA: Diagnosis not present

## 2017-10-01 DIAGNOSIS — I10 Essential (primary) hypertension: Secondary | ICD-10-CM | POA: Diagnosis not present

## 2017-10-01 DIAGNOSIS — G20 Parkinson's disease: Secondary | ICD-10-CM | POA: Diagnosis not present

## 2017-10-01 DIAGNOSIS — F29 Unspecified psychosis not due to a substance or known physiological condition: Secondary | ICD-10-CM | POA: Diagnosis not present

## 2017-10-01 DIAGNOSIS — R1312 Dysphagia, oropharyngeal phase: Secondary | ICD-10-CM | POA: Diagnosis not present

## 2017-10-01 DIAGNOSIS — E039 Hypothyroidism, unspecified: Secondary | ICD-10-CM | POA: Diagnosis not present

## 2017-10-01 DIAGNOSIS — R293 Abnormal posture: Secondary | ICD-10-CM | POA: Diagnosis not present

## 2017-10-01 DIAGNOSIS — R488 Other symbolic dysfunctions: Secondary | ICD-10-CM | POA: Diagnosis not present

## 2017-10-01 DIAGNOSIS — R278 Other lack of coordination: Secondary | ICD-10-CM | POA: Diagnosis not present

## 2017-10-01 DIAGNOSIS — F411 Generalized anxiety disorder: Secondary | ICD-10-CM | POA: Diagnosis not present

## 2017-10-02 DIAGNOSIS — R293 Abnormal posture: Secondary | ICD-10-CM | POA: Diagnosis not present

## 2017-10-02 DIAGNOSIS — G20 Parkinson's disease: Secondary | ICD-10-CM | POA: Diagnosis not present

## 2017-10-02 DIAGNOSIS — R1312 Dysphagia, oropharyngeal phase: Secondary | ICD-10-CM | POA: Diagnosis not present

## 2017-10-02 DIAGNOSIS — R278 Other lack of coordination: Secondary | ICD-10-CM | POA: Diagnosis not present

## 2017-10-02 DIAGNOSIS — R488 Other symbolic dysfunctions: Secondary | ICD-10-CM | POA: Diagnosis not present

## 2017-10-03 DIAGNOSIS — R278 Other lack of coordination: Secondary | ICD-10-CM | POA: Diagnosis not present

## 2017-10-03 DIAGNOSIS — R488 Other symbolic dysfunctions: Secondary | ICD-10-CM | POA: Diagnosis not present

## 2017-10-03 DIAGNOSIS — R293 Abnormal posture: Secondary | ICD-10-CM | POA: Diagnosis not present

## 2017-10-03 DIAGNOSIS — R1312 Dysphagia, oropharyngeal phase: Secondary | ICD-10-CM | POA: Diagnosis not present

## 2017-10-03 DIAGNOSIS — G20 Parkinson's disease: Secondary | ICD-10-CM | POA: Diagnosis not present

## 2017-10-10 DIAGNOSIS — G20 Parkinson's disease: Secondary | ICD-10-CM | POA: Diagnosis not present

## 2017-10-10 DIAGNOSIS — L89150 Pressure ulcer of sacral region, unstageable: Secondary | ICD-10-CM | POA: Diagnosis not present

## 2017-10-10 DIAGNOSIS — F0391 Unspecified dementia with behavioral disturbance: Secondary | ICD-10-CM | POA: Diagnosis not present

## 2017-10-10 DIAGNOSIS — S81801A Unspecified open wound, right lower leg, initial encounter: Secondary | ICD-10-CM | POA: Diagnosis not present

## 2017-10-21 DIAGNOSIS — R1312 Dysphagia, oropharyngeal phase: Secondary | ICD-10-CM | POA: Diagnosis not present

## 2017-10-21 DIAGNOSIS — R278 Other lack of coordination: Secondary | ICD-10-CM | POA: Diagnosis not present

## 2017-10-21 DIAGNOSIS — R293 Abnormal posture: Secondary | ICD-10-CM | POA: Diagnosis not present

## 2017-10-21 DIAGNOSIS — G20 Parkinson's disease: Secondary | ICD-10-CM | POA: Diagnosis not present

## 2017-10-21 DIAGNOSIS — R488 Other symbolic dysfunctions: Secondary | ICD-10-CM | POA: Diagnosis not present

## 2017-10-21 DIAGNOSIS — L89150 Pressure ulcer of sacral region, unstageable: Secondary | ICD-10-CM | POA: Diagnosis not present

## 2017-10-22 DIAGNOSIS — R1312 Dysphagia, oropharyngeal phase: Secondary | ICD-10-CM | POA: Diagnosis not present

## 2017-10-22 DIAGNOSIS — R293 Abnormal posture: Secondary | ICD-10-CM | POA: Diagnosis not present

## 2017-10-22 DIAGNOSIS — R488 Other symbolic dysfunctions: Secondary | ICD-10-CM | POA: Diagnosis not present

## 2017-10-22 DIAGNOSIS — R278 Other lack of coordination: Secondary | ICD-10-CM | POA: Diagnosis not present

## 2017-10-22 DIAGNOSIS — G20 Parkinson's disease: Secondary | ICD-10-CM | POA: Diagnosis not present

## 2017-10-23 DIAGNOSIS — R488 Other symbolic dysfunctions: Secondary | ICD-10-CM | POA: Diagnosis not present

## 2017-10-23 DIAGNOSIS — G20 Parkinson's disease: Secondary | ICD-10-CM | POA: Diagnosis not present

## 2017-10-23 DIAGNOSIS — R278 Other lack of coordination: Secondary | ICD-10-CM | POA: Diagnosis not present

## 2017-10-23 DIAGNOSIS — R293 Abnormal posture: Secondary | ICD-10-CM | POA: Diagnosis not present

## 2017-10-23 DIAGNOSIS — R1312 Dysphagia, oropharyngeal phase: Secondary | ICD-10-CM | POA: Diagnosis not present

## 2017-10-24 DIAGNOSIS — G20 Parkinson's disease: Secondary | ICD-10-CM | POA: Diagnosis not present

## 2017-10-24 DIAGNOSIS — R488 Other symbolic dysfunctions: Secondary | ICD-10-CM | POA: Diagnosis not present

## 2017-10-24 DIAGNOSIS — S31809A Unspecified open wound of unspecified buttock, initial encounter: Secondary | ICD-10-CM | POA: Diagnosis not present

## 2017-10-26 DIAGNOSIS — R488 Other symbolic dysfunctions: Secondary | ICD-10-CM | POA: Diagnosis not present

## 2017-10-26 DIAGNOSIS — G20 Parkinson's disease: Secondary | ICD-10-CM | POA: Diagnosis not present

## 2017-10-26 DIAGNOSIS — S31809A Unspecified open wound of unspecified buttock, initial encounter: Secondary | ICD-10-CM | POA: Diagnosis not present

## 2017-10-27 ENCOUNTER — Ambulatory Visit: Payer: Medicare Other | Admitting: Podiatry

## 2017-10-28 DIAGNOSIS — S31809A Unspecified open wound of unspecified buttock, initial encounter: Secondary | ICD-10-CM | POA: Diagnosis not present

## 2017-10-28 DIAGNOSIS — G20 Parkinson's disease: Secondary | ICD-10-CM | POA: Diagnosis not present

## 2017-10-28 DIAGNOSIS — R488 Other symbolic dysfunctions: Secondary | ICD-10-CM | POA: Diagnosis not present

## 2017-10-29 DIAGNOSIS — S31809A Unspecified open wound of unspecified buttock, initial encounter: Secondary | ICD-10-CM | POA: Diagnosis not present

## 2017-10-29 DIAGNOSIS — G20 Parkinson's disease: Secondary | ICD-10-CM | POA: Diagnosis not present

## 2017-10-29 DIAGNOSIS — R488 Other symbolic dysfunctions: Secondary | ICD-10-CM | POA: Diagnosis not present

## 2017-10-30 DIAGNOSIS — R488 Other symbolic dysfunctions: Secondary | ICD-10-CM | POA: Diagnosis not present

## 2017-10-30 DIAGNOSIS — G20 Parkinson's disease: Secondary | ICD-10-CM | POA: Diagnosis not present

## 2017-10-30 DIAGNOSIS — S31809A Unspecified open wound of unspecified buttock, initial encounter: Secondary | ICD-10-CM | POA: Diagnosis not present

## 2017-10-31 DIAGNOSIS — H409 Unspecified glaucoma: Secondary | ICD-10-CM | POA: Diagnosis not present

## 2017-10-31 DIAGNOSIS — I951 Orthostatic hypotension: Secondary | ICD-10-CM | POA: Diagnosis not present

## 2017-10-31 DIAGNOSIS — K219 Gastro-esophageal reflux disease without esophagitis: Secondary | ICD-10-CM | POA: Diagnosis not present

## 2017-10-31 DIAGNOSIS — L8990 Pressure ulcer of unspecified site, unspecified stage: Secondary | ICD-10-CM | POA: Diagnosis not present

## 2017-10-31 DIAGNOSIS — I10 Essential (primary) hypertension: Secondary | ICD-10-CM | POA: Diagnosis not present

## 2017-10-31 DIAGNOSIS — F411 Generalized anxiety disorder: Secondary | ICD-10-CM | POA: Diagnosis not present

## 2017-10-31 DIAGNOSIS — E039 Hypothyroidism, unspecified: Secondary | ICD-10-CM | POA: Diagnosis not present

## 2017-10-31 DIAGNOSIS — R1310 Dysphagia, unspecified: Secondary | ICD-10-CM | POA: Diagnosis not present

## 2017-10-31 DIAGNOSIS — L89150 Pressure ulcer of sacral region, unstageable: Secondary | ICD-10-CM | POA: Diagnosis not present

## 2017-10-31 DIAGNOSIS — I509 Heart failure, unspecified: Secondary | ICD-10-CM | POA: Diagnosis not present

## 2017-10-31 DIAGNOSIS — G20 Parkinson's disease: Secondary | ICD-10-CM | POA: Diagnosis not present

## 2017-10-31 DIAGNOSIS — E785 Hyperlipidemia, unspecified: Secondary | ICD-10-CM | POA: Diagnosis not present

## 2017-10-31 DIAGNOSIS — G3183 Dementia with Lewy bodies: Secondary | ICD-10-CM | POA: Diagnosis not present

## 2017-10-31 DIAGNOSIS — G2401 Drug induced subacute dyskinesia: Secondary | ICD-10-CM | POA: Diagnosis not present

## 2017-10-31 DIAGNOSIS — H11149 Conjunctival xerosis, unspecified, unspecified eye: Secondary | ICD-10-CM | POA: Diagnosis not present

## 2017-10-31 DIAGNOSIS — I679 Cerebrovascular disease, unspecified: Secondary | ICD-10-CM | POA: Diagnosis not present

## 2017-11-03 DIAGNOSIS — G2401 Drug induced subacute dyskinesia: Secondary | ICD-10-CM | POA: Diagnosis not present

## 2017-11-03 DIAGNOSIS — G3183 Dementia with Lewy bodies: Secondary | ICD-10-CM | POA: Diagnosis not present

## 2017-11-03 DIAGNOSIS — I509 Heart failure, unspecified: Secondary | ICD-10-CM | POA: Diagnosis not present

## 2017-11-03 DIAGNOSIS — I951 Orthostatic hypotension: Secondary | ICD-10-CM | POA: Diagnosis not present

## 2017-11-03 DIAGNOSIS — G20 Parkinson's disease: Secondary | ICD-10-CM | POA: Diagnosis not present

## 2017-11-03 DIAGNOSIS — R1310 Dysphagia, unspecified: Secondary | ICD-10-CM | POA: Diagnosis not present

## 2017-11-04 DIAGNOSIS — I951 Orthostatic hypotension: Secondary | ICD-10-CM | POA: Diagnosis not present

## 2017-11-04 DIAGNOSIS — G2401 Drug induced subacute dyskinesia: Secondary | ICD-10-CM | POA: Diagnosis not present

## 2017-11-04 DIAGNOSIS — I509 Heart failure, unspecified: Secondary | ICD-10-CM | POA: Diagnosis not present

## 2017-11-04 DIAGNOSIS — G3183 Dementia with Lewy bodies: Secondary | ICD-10-CM | POA: Diagnosis not present

## 2017-11-04 DIAGNOSIS — G20 Parkinson's disease: Secondary | ICD-10-CM | POA: Diagnosis not present

## 2017-11-04 DIAGNOSIS — R1310 Dysphagia, unspecified: Secondary | ICD-10-CM | POA: Diagnosis not present

## 2017-11-06 DIAGNOSIS — G3183 Dementia with Lewy bodies: Secondary | ICD-10-CM | POA: Diagnosis not present

## 2017-11-06 DIAGNOSIS — G2401 Drug induced subacute dyskinesia: Secondary | ICD-10-CM | POA: Diagnosis not present

## 2017-11-06 DIAGNOSIS — R1310 Dysphagia, unspecified: Secondary | ICD-10-CM | POA: Diagnosis not present

## 2017-11-06 DIAGNOSIS — I509 Heart failure, unspecified: Secondary | ICD-10-CM | POA: Diagnosis not present

## 2017-11-06 DIAGNOSIS — I951 Orthostatic hypotension: Secondary | ICD-10-CM | POA: Diagnosis not present

## 2017-11-06 DIAGNOSIS — G20 Parkinson's disease: Secondary | ICD-10-CM | POA: Diagnosis not present

## 2017-11-07 DIAGNOSIS — I509 Heart failure, unspecified: Secondary | ICD-10-CM | POA: Diagnosis not present

## 2017-11-07 DIAGNOSIS — I951 Orthostatic hypotension: Secondary | ICD-10-CM | POA: Diagnosis not present

## 2017-11-07 DIAGNOSIS — R1310 Dysphagia, unspecified: Secondary | ICD-10-CM | POA: Diagnosis not present

## 2017-11-07 DIAGNOSIS — G20 Parkinson's disease: Secondary | ICD-10-CM | POA: Diagnosis not present

## 2017-11-07 DIAGNOSIS — G3183 Dementia with Lewy bodies: Secondary | ICD-10-CM | POA: Diagnosis not present

## 2017-11-07 DIAGNOSIS — G2401 Drug induced subacute dyskinesia: Secondary | ICD-10-CM | POA: Diagnosis not present

## 2017-11-10 DIAGNOSIS — I951 Orthostatic hypotension: Secondary | ICD-10-CM | POA: Diagnosis not present

## 2017-11-10 DIAGNOSIS — G3183 Dementia with Lewy bodies: Secondary | ICD-10-CM | POA: Diagnosis not present

## 2017-11-10 DIAGNOSIS — G2401 Drug induced subacute dyskinesia: Secondary | ICD-10-CM | POA: Diagnosis not present

## 2017-11-10 DIAGNOSIS — I509 Heart failure, unspecified: Secondary | ICD-10-CM | POA: Diagnosis not present

## 2017-11-10 DIAGNOSIS — G20 Parkinson's disease: Secondary | ICD-10-CM | POA: Diagnosis not present

## 2017-11-10 DIAGNOSIS — R1310 Dysphagia, unspecified: Secondary | ICD-10-CM | POA: Diagnosis not present

## 2017-11-11 DIAGNOSIS — G2401 Drug induced subacute dyskinesia: Secondary | ICD-10-CM | POA: Diagnosis not present

## 2017-11-11 DIAGNOSIS — I509 Heart failure, unspecified: Secondary | ICD-10-CM | POA: Diagnosis not present

## 2017-11-11 DIAGNOSIS — R1310 Dysphagia, unspecified: Secondary | ICD-10-CM | POA: Diagnosis not present

## 2017-11-11 DIAGNOSIS — G3183 Dementia with Lewy bodies: Secondary | ICD-10-CM | POA: Diagnosis not present

## 2017-11-11 DIAGNOSIS — G20 Parkinson's disease: Secondary | ICD-10-CM | POA: Diagnosis not present

## 2017-11-11 DIAGNOSIS — I951 Orthostatic hypotension: Secondary | ICD-10-CM | POA: Diagnosis not present

## 2017-11-13 DIAGNOSIS — R1310 Dysphagia, unspecified: Secondary | ICD-10-CM | POA: Diagnosis not present

## 2017-11-13 DIAGNOSIS — G2401 Drug induced subacute dyskinesia: Secondary | ICD-10-CM | POA: Diagnosis not present

## 2017-11-13 DIAGNOSIS — G20 Parkinson's disease: Secondary | ICD-10-CM | POA: Diagnosis not present

## 2017-11-13 DIAGNOSIS — I509 Heart failure, unspecified: Secondary | ICD-10-CM | POA: Diagnosis not present

## 2017-11-13 DIAGNOSIS — G3183 Dementia with Lewy bodies: Secondary | ICD-10-CM | POA: Diagnosis not present

## 2017-11-13 DIAGNOSIS — I951 Orthostatic hypotension: Secondary | ICD-10-CM | POA: Diagnosis not present

## 2017-11-18 ENCOUNTER — Encounter: Payer: Self-pay | Admitting: Neurology

## 2017-11-19 DIAGNOSIS — I509 Heart failure, unspecified: Secondary | ICD-10-CM | POA: Diagnosis not present

## 2017-11-19 DIAGNOSIS — G3183 Dementia with Lewy bodies: Secondary | ICD-10-CM | POA: Diagnosis not present

## 2017-11-19 DIAGNOSIS — R1310 Dysphagia, unspecified: Secondary | ICD-10-CM | POA: Diagnosis not present

## 2017-11-19 DIAGNOSIS — G20 Parkinson's disease: Secondary | ICD-10-CM | POA: Diagnosis not present

## 2017-11-19 DIAGNOSIS — G2401 Drug induced subacute dyskinesia: Secondary | ICD-10-CM | POA: Diagnosis not present

## 2017-11-19 DIAGNOSIS — I951 Orthostatic hypotension: Secondary | ICD-10-CM | POA: Diagnosis not present

## 2017-11-21 DIAGNOSIS — G20 Parkinson's disease: Secondary | ICD-10-CM | POA: Diagnosis not present

## 2017-11-21 DIAGNOSIS — E785 Hyperlipidemia, unspecified: Secondary | ICD-10-CM | POA: Diagnosis not present

## 2017-11-21 DIAGNOSIS — K219 Gastro-esophageal reflux disease without esophagitis: Secondary | ICD-10-CM | POA: Diagnosis not present

## 2017-11-21 DIAGNOSIS — I509 Heart failure, unspecified: Secondary | ICD-10-CM | POA: Diagnosis not present

## 2017-11-21 DIAGNOSIS — E039 Hypothyroidism, unspecified: Secondary | ICD-10-CM | POA: Diagnosis not present

## 2017-11-21 DIAGNOSIS — H11149 Conjunctival xerosis, unspecified, unspecified eye: Secondary | ICD-10-CM | POA: Diagnosis not present

## 2017-11-21 DIAGNOSIS — H409 Unspecified glaucoma: Secondary | ICD-10-CM | POA: Diagnosis not present

## 2017-11-21 DIAGNOSIS — L8990 Pressure ulcer of unspecified site, unspecified stage: Secondary | ICD-10-CM | POA: Diagnosis not present

## 2017-11-21 DIAGNOSIS — R1310 Dysphagia, unspecified: Secondary | ICD-10-CM | POA: Diagnosis not present

## 2017-11-21 DIAGNOSIS — I679 Cerebrovascular disease, unspecified: Secondary | ICD-10-CM | POA: Diagnosis not present

## 2017-11-21 DIAGNOSIS — F411 Generalized anxiety disorder: Secondary | ICD-10-CM | POA: Diagnosis not present

## 2017-11-21 DIAGNOSIS — G3183 Dementia with Lewy bodies: Secondary | ICD-10-CM | POA: Diagnosis not present

## 2017-11-21 DIAGNOSIS — I951 Orthostatic hypotension: Secondary | ICD-10-CM | POA: Diagnosis not present

## 2017-11-21 DIAGNOSIS — G2401 Drug induced subacute dyskinesia: Secondary | ICD-10-CM | POA: Diagnosis not present

## 2017-11-21 DIAGNOSIS — I10 Essential (primary) hypertension: Secondary | ICD-10-CM | POA: Diagnosis not present

## 2017-11-24 DIAGNOSIS — G3183 Dementia with Lewy bodies: Secondary | ICD-10-CM | POA: Diagnosis not present

## 2017-11-24 DIAGNOSIS — G2401 Drug induced subacute dyskinesia: Secondary | ICD-10-CM | POA: Diagnosis not present

## 2017-11-24 DIAGNOSIS — I951 Orthostatic hypotension: Secondary | ICD-10-CM | POA: Diagnosis not present

## 2017-11-24 DIAGNOSIS — R1310 Dysphagia, unspecified: Secondary | ICD-10-CM | POA: Diagnosis not present

## 2017-11-24 DIAGNOSIS — G20 Parkinson's disease: Secondary | ICD-10-CM | POA: Diagnosis not present

## 2017-11-24 DIAGNOSIS — I509 Heart failure, unspecified: Secondary | ICD-10-CM | POA: Diagnosis not present

## 2017-11-26 DIAGNOSIS — G3183 Dementia with Lewy bodies: Secondary | ICD-10-CM | POA: Diagnosis not present

## 2017-11-26 DIAGNOSIS — I951 Orthostatic hypotension: Secondary | ICD-10-CM | POA: Diagnosis not present

## 2017-11-26 DIAGNOSIS — G2401 Drug induced subacute dyskinesia: Secondary | ICD-10-CM | POA: Diagnosis not present

## 2017-11-26 DIAGNOSIS — I509 Heart failure, unspecified: Secondary | ICD-10-CM | POA: Diagnosis not present

## 2017-11-26 DIAGNOSIS — E039 Hypothyroidism, unspecified: Secondary | ICD-10-CM | POA: Diagnosis not present

## 2017-11-26 DIAGNOSIS — G20 Parkinson's disease: Secondary | ICD-10-CM | POA: Diagnosis not present

## 2017-11-26 DIAGNOSIS — F29 Unspecified psychosis not due to a substance or known physiological condition: Secondary | ICD-10-CM | POA: Diagnosis not present

## 2017-11-26 DIAGNOSIS — Z66 Do not resuscitate: Secondary | ICD-10-CM | POA: Diagnosis not present

## 2017-11-26 DIAGNOSIS — R1310 Dysphagia, unspecified: Secondary | ICD-10-CM | POA: Diagnosis not present

## 2017-11-27 ENCOUNTER — Ambulatory Visit: Payer: Medicare Other | Admitting: Neurology

## 2017-11-28 DIAGNOSIS — L89154 Pressure ulcer of sacral region, stage 4: Secondary | ICD-10-CM | POA: Diagnosis not present

## 2017-12-01 DIAGNOSIS — I951 Orthostatic hypotension: Secondary | ICD-10-CM | POA: Diagnosis not present

## 2017-12-01 DIAGNOSIS — G3183 Dementia with Lewy bodies: Secondary | ICD-10-CM | POA: Diagnosis not present

## 2017-12-01 DIAGNOSIS — G20 Parkinson's disease: Secondary | ICD-10-CM | POA: Diagnosis not present

## 2017-12-01 DIAGNOSIS — I509 Heart failure, unspecified: Secondary | ICD-10-CM | POA: Diagnosis not present

## 2017-12-01 DIAGNOSIS — G2401 Drug induced subacute dyskinesia: Secondary | ICD-10-CM | POA: Diagnosis not present

## 2017-12-01 DIAGNOSIS — R1310 Dysphagia, unspecified: Secondary | ICD-10-CM | POA: Diagnosis not present

## 2017-12-03 DIAGNOSIS — G3183 Dementia with Lewy bodies: Secondary | ICD-10-CM | POA: Diagnosis not present

## 2017-12-03 DIAGNOSIS — I509 Heart failure, unspecified: Secondary | ICD-10-CM | POA: Diagnosis not present

## 2017-12-03 DIAGNOSIS — I951 Orthostatic hypotension: Secondary | ICD-10-CM | POA: Diagnosis not present

## 2017-12-03 DIAGNOSIS — G2401 Drug induced subacute dyskinesia: Secondary | ICD-10-CM | POA: Diagnosis not present

## 2017-12-03 DIAGNOSIS — R1310 Dysphagia, unspecified: Secondary | ICD-10-CM | POA: Diagnosis not present

## 2017-12-03 DIAGNOSIS — G20 Parkinson's disease: Secondary | ICD-10-CM | POA: Diagnosis not present

## 2017-12-08 DIAGNOSIS — I951 Orthostatic hypotension: Secondary | ICD-10-CM | POA: Diagnosis not present

## 2017-12-08 DIAGNOSIS — I509 Heart failure, unspecified: Secondary | ICD-10-CM | POA: Diagnosis not present

## 2017-12-08 DIAGNOSIS — G3183 Dementia with Lewy bodies: Secondary | ICD-10-CM | POA: Diagnosis not present

## 2017-12-08 DIAGNOSIS — R1310 Dysphagia, unspecified: Secondary | ICD-10-CM | POA: Diagnosis not present

## 2017-12-08 DIAGNOSIS — G2401 Drug induced subacute dyskinesia: Secondary | ICD-10-CM | POA: Diagnosis not present

## 2017-12-08 DIAGNOSIS — G20 Parkinson's disease: Secondary | ICD-10-CM | POA: Diagnosis not present

## 2017-12-10 DIAGNOSIS — R1310 Dysphagia, unspecified: Secondary | ICD-10-CM | POA: Diagnosis not present

## 2017-12-10 DIAGNOSIS — G2401 Drug induced subacute dyskinesia: Secondary | ICD-10-CM | POA: Diagnosis not present

## 2017-12-10 DIAGNOSIS — I10 Essential (primary) hypertension: Secondary | ICD-10-CM | POA: Diagnosis not present

## 2017-12-10 DIAGNOSIS — I951 Orthostatic hypotension: Secondary | ICD-10-CM | POA: Diagnosis not present

## 2017-12-10 DIAGNOSIS — I509 Heart failure, unspecified: Secondary | ICD-10-CM | POA: Diagnosis not present

## 2017-12-10 DIAGNOSIS — G20 Parkinson's disease: Secondary | ICD-10-CM | POA: Diagnosis not present

## 2017-12-10 DIAGNOSIS — G3183 Dementia with Lewy bodies: Secondary | ICD-10-CM | POA: Diagnosis not present

## 2017-12-15 DIAGNOSIS — I509 Heart failure, unspecified: Secondary | ICD-10-CM | POA: Diagnosis not present

## 2017-12-15 DIAGNOSIS — I951 Orthostatic hypotension: Secondary | ICD-10-CM | POA: Diagnosis not present

## 2017-12-15 DIAGNOSIS — G20 Parkinson's disease: Secondary | ICD-10-CM | POA: Diagnosis not present

## 2017-12-15 DIAGNOSIS — G2401 Drug induced subacute dyskinesia: Secondary | ICD-10-CM | POA: Diagnosis not present

## 2017-12-15 DIAGNOSIS — G3183 Dementia with Lewy bodies: Secondary | ICD-10-CM | POA: Diagnosis not present

## 2017-12-15 DIAGNOSIS — R1310 Dysphagia, unspecified: Secondary | ICD-10-CM | POA: Diagnosis not present

## 2017-12-17 DIAGNOSIS — I509 Heart failure, unspecified: Secondary | ICD-10-CM | POA: Diagnosis not present

## 2017-12-17 DIAGNOSIS — G2401 Drug induced subacute dyskinesia: Secondary | ICD-10-CM | POA: Diagnosis not present

## 2017-12-17 DIAGNOSIS — R1310 Dysphagia, unspecified: Secondary | ICD-10-CM | POA: Diagnosis not present

## 2017-12-17 DIAGNOSIS — I951 Orthostatic hypotension: Secondary | ICD-10-CM | POA: Diagnosis not present

## 2017-12-17 DIAGNOSIS — G20 Parkinson's disease: Secondary | ICD-10-CM | POA: Diagnosis not present

## 2017-12-17 DIAGNOSIS — G3183 Dementia with Lewy bodies: Secondary | ICD-10-CM | POA: Diagnosis not present

## 2017-12-19 DIAGNOSIS — I509 Heart failure, unspecified: Secondary | ICD-10-CM | POA: Diagnosis not present

## 2017-12-19 DIAGNOSIS — R1310 Dysphagia, unspecified: Secondary | ICD-10-CM | POA: Diagnosis not present

## 2017-12-19 DIAGNOSIS — G3183 Dementia with Lewy bodies: Secondary | ICD-10-CM | POA: Diagnosis not present

## 2017-12-19 DIAGNOSIS — I951 Orthostatic hypotension: Secondary | ICD-10-CM | POA: Diagnosis not present

## 2017-12-19 DIAGNOSIS — G20 Parkinson's disease: Secondary | ICD-10-CM | POA: Diagnosis not present

## 2017-12-19 DIAGNOSIS — G2401 Drug induced subacute dyskinesia: Secondary | ICD-10-CM | POA: Diagnosis not present

## 2017-12-22 DIAGNOSIS — R1310 Dysphagia, unspecified: Secondary | ICD-10-CM | POA: Diagnosis not present

## 2017-12-22 DIAGNOSIS — K219 Gastro-esophageal reflux disease without esophagitis: Secondary | ICD-10-CM | POA: Diagnosis not present

## 2017-12-22 DIAGNOSIS — E039 Hypothyroidism, unspecified: Secondary | ICD-10-CM | POA: Diagnosis not present

## 2017-12-22 DIAGNOSIS — F411 Generalized anxiety disorder: Secondary | ICD-10-CM | POA: Diagnosis not present

## 2017-12-22 DIAGNOSIS — I679 Cerebrovascular disease, unspecified: Secondary | ICD-10-CM | POA: Diagnosis not present

## 2017-12-22 DIAGNOSIS — G2401 Drug induced subacute dyskinesia: Secondary | ICD-10-CM | POA: Diagnosis not present

## 2017-12-22 DIAGNOSIS — L8990 Pressure ulcer of unspecified site, unspecified stage: Secondary | ICD-10-CM | POA: Diagnosis not present

## 2017-12-22 DIAGNOSIS — E785 Hyperlipidemia, unspecified: Secondary | ICD-10-CM | POA: Diagnosis not present

## 2017-12-22 DIAGNOSIS — H409 Unspecified glaucoma: Secondary | ICD-10-CM | POA: Diagnosis not present

## 2017-12-22 DIAGNOSIS — H11149 Conjunctival xerosis, unspecified, unspecified eye: Secondary | ICD-10-CM | POA: Diagnosis not present

## 2017-12-22 DIAGNOSIS — G3183 Dementia with Lewy bodies: Secondary | ICD-10-CM | POA: Diagnosis not present

## 2017-12-22 DIAGNOSIS — I509 Heart failure, unspecified: Secondary | ICD-10-CM | POA: Diagnosis not present

## 2017-12-22 DIAGNOSIS — I10 Essential (primary) hypertension: Secondary | ICD-10-CM | POA: Diagnosis not present

## 2017-12-22 DIAGNOSIS — I951 Orthostatic hypotension: Secondary | ICD-10-CM | POA: Diagnosis not present

## 2017-12-22 DIAGNOSIS — G20 Parkinson's disease: Secondary | ICD-10-CM | POA: Diagnosis not present

## 2017-12-25 ENCOUNTER — Telehealth: Payer: Self-pay

## 2017-12-25 ENCOUNTER — Ambulatory Visit: Payer: Medicare Other | Admitting: Neurology

## 2017-12-25 NOTE — Telephone Encounter (Signed)
Pt did not show for their appt with Dr. Athar today.  

## 2017-12-29 ENCOUNTER — Encounter: Payer: Self-pay | Admitting: Neurology

## 2017-12-29 ENCOUNTER — Other Ambulatory Visit: Payer: Self-pay

## 2017-12-29 ENCOUNTER — Emergency Department (HOSPITAL_COMMUNITY)

## 2017-12-29 ENCOUNTER — Emergency Department (HOSPITAL_COMMUNITY)
Admission: EM | Admit: 2017-12-29 | Discharge: 2017-12-29 | Disposition: A | Discharge status: Home / Self Care | Attending: Emergency Medicine | Admitting: Emergency Medicine

## 2017-12-29 ENCOUNTER — Encounter (HOSPITAL_COMMUNITY): Payer: Self-pay

## 2017-12-29 DIAGNOSIS — Z8673 Personal history of transient ischemic attack (TIA), and cerebral infarction without residual deficits: Secondary | ICD-10-CM | POA: Diagnosis not present

## 2017-12-29 DIAGNOSIS — S199XXA Unspecified injury of neck, initial encounter: Secondary | ICD-10-CM | POA: Diagnosis not present

## 2017-12-29 DIAGNOSIS — I110 Hypertensive heart disease with heart failure: Secondary | ICD-10-CM | POA: Insufficient documentation

## 2017-12-29 DIAGNOSIS — W06XXXA Fall from bed, initial encounter: Secondary | ICD-10-CM | POA: Diagnosis not present

## 2017-12-29 DIAGNOSIS — Y999 Unspecified external cause status: Secondary | ICD-10-CM | POA: Insufficient documentation

## 2017-12-29 DIAGNOSIS — G20 Parkinson's disease: Secondary | ICD-10-CM | POA: Insufficient documentation

## 2017-12-29 DIAGNOSIS — I5023 Acute on chronic systolic (congestive) heart failure: Secondary | ICD-10-CM | POA: Diagnosis not present

## 2017-12-29 DIAGNOSIS — Z7982 Long term (current) use of aspirin: Secondary | ICD-10-CM | POA: Diagnosis not present

## 2017-12-29 DIAGNOSIS — S0101XA Laceration without foreign body of scalp, initial encounter: Secondary | ICD-10-CM | POA: Insufficient documentation

## 2017-12-29 DIAGNOSIS — Y939 Activity, unspecified: Secondary | ICD-10-CM | POA: Insufficient documentation

## 2017-12-29 DIAGNOSIS — S0990XA Unspecified injury of head, initial encounter: Secondary | ICD-10-CM | POA: Diagnosis not present

## 2017-12-29 DIAGNOSIS — F0390 Unspecified dementia without behavioral disturbance: Secondary | ICD-10-CM | POA: Insufficient documentation

## 2017-12-29 DIAGNOSIS — R4182 Altered mental status, unspecified: Secondary | ICD-10-CM | POA: Diagnosis not present

## 2017-12-29 DIAGNOSIS — Y92122 Bedroom in nursing home as the place of occurrence of the external cause: Secondary | ICD-10-CM | POA: Insufficient documentation

## 2017-12-29 DIAGNOSIS — S0181XA Laceration without foreign body of other part of head, initial encounter: Secondary | ICD-10-CM | POA: Diagnosis not present

## 2017-12-29 DIAGNOSIS — E039 Hypothyroidism, unspecified: Secondary | ICD-10-CM | POA: Diagnosis not present

## 2017-12-29 DIAGNOSIS — Z79899 Other long term (current) drug therapy: Secondary | ICD-10-CM | POA: Insufficient documentation

## 2017-12-29 DIAGNOSIS — W19XXXA Unspecified fall, initial encounter: Secondary | ICD-10-CM

## 2017-12-29 MED ORDER — LIDOCAINE-EPINEPHRINE 1 %-1:100000 IJ SOLN
20.0000 mL | Freq: Once | INTRAMUSCULAR | Status: AC
Start: 2017-12-29 — End: 2017-12-29
  Administered 2017-12-29: 20 mL via INTRADERMAL
  Filled 2017-12-29: qty 20

## 2017-12-29 NOTE — ED Provider Notes (Addendum)
[facility] EMERGENCY DEPARTMENT Provider Note   CSN: [id number] Arrival date & time: 12/29/17  5400     History   Chief Complaint Chief Complaint  Patient presents with  . Fall    HPI [name] is a 77 y.o. female.  77 yo F with a chief complaint of a fall out of the bed.  This was noted by the nursing home.  The patient is bedbound demented and not typically verbal.  Level 5 caveat nonverbal.  The patient shakes her head no when asked if anything is bothering her.  Per the nursing home she has a lack to the left side of her head and so they sent her here.  The history is provided by the patient.  Fall  This is a new problem. The current episode started 1 to 2 hours ago. The problem occurs constantly. The problem has not changed since onset.Pertinent negatives include no chest pain, no headaches and no shortness of breath. Nothing aggravates the symptoms. Nothing relieves the symptoms. She has tried nothing for the symptoms. The treatment provided no relief.    Past Medical History:  Diagnosis Date  . Acute on chronic systolic heart failure ([place])   . Anemia   . Anxiety   . Arthritis   . Cataract left  . Cellulitis and abscess of leg, except foot   . Cellulitis of lower leg 12/24/2011  . CHF (congestive heart failure) ([place])   . Chronic bronchitis ([place])    "usually get it q yr"  . Chronic systolic heart failure ([place])   . GERD (gastroesophageal reflux disease)   . Glaucoma left  . Helicobacter pylori (H. pylori)   . History of bleeding peptic ulcer   . History of blood transfusion    "don't remember why"  . Hypercholesterolemia   . Hypertension   . Hypotension, unspecified   . Hypothyroidism   . Parkinson's disease   . Pneumonia   . Sleep apnea    "had test years ago; insurance wouldn't pay for mask so I never had one" (04/27/2015)  . Stroke [facility]) X 3   "that  was the reason I got Parkinson's"    Patient Active Problem List   Diagnosis Date  Noted  . 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 Anxiety state 06/18/2016  . 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 Syncope 06/10/2016  . Fall 06/10/2016  . Near syncope   . Altered mental state   . Chronic systolic congestive heart failure ([place])   . CHF exacerbation ([place]) 05/25/2016  . Pneumonia 05/25/2016  . Bilateral cellulitis of lower leg 03/11/2016  . Venous stasis dermatitis of both lower extremities 03/11/2016  . Melena 02/15/2016  . Right lower quadrant abdominal swelling, mass and lump 02/15/2016  . Acute delirium 04/27/2015  . Acute encephalopathy 04/27/2015  . UTI (urinary tract infection): Probable 04/27/2015  . Debility   . Dyskinesia 04/24/2015  . Cellulitis of left leg 04/23/2015  . Failure to thrive in adult 04/23/2015  . Chronic low back pain 01/18/2015  . Lower extremity pain 12/07/2014  . Cellulitis of left lower extremity   . Nausea vomiting and diarrhea 08/07/2014  . Enteritis 08/07/2014  . 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 Chronic constipation 02/11/2014  . Primary freezing of gait 02/11/2014  . Abnormality of gait 01/28/2014  . Physical deconditioning 10/05/2013  . Thrombocytopenia, unspecified ([place]) 09/30/2013  . Rectal bleeding 09/29/2013  .  UGI bleed 09/29/2013  . Anemia due to blood loss, acute 09/29/2013  . Anemia, iron deficiency 04/09/2013  . [place] lesion, acute 01/08/2012  . Acute urinary retention, resolved after foley and no re-occurrence, 12/11/11  12/24/2011  . Cellulitis of lower leg 12/24/2011  . Diastolic dysfunction, grade 2 12/15/2011  . CAP (community acquired pneumonia)  12/15/2011  . Abnormal EKG,(TWI lead 3 on one of 7 EKGs this admission) 12/12/2011  . Chest pain, MI R/O, negative MI- secondary to CAP 12/10/11 12/10/2011  . Cor pulmonale ([place]) 12/10/2011  . Syncope and collapse, pt fell and did not know why she fell. 12/10/2011  . Hematemesis  09/26/2011  . Acute blood loss anemia 09/26/2011  . Hypotension 09/26/2011  . Leucocytosis 09/26/2011  . Aspiration of blood 09/26/2011  . Parkinson disease ([place]) 09/26/2011  . Essential hypertension 09/26/2011  . GI bleed: January 2013, Hgb stable, stool negative 09/26/2011  . Gastric ulcer with hemorrhage 09/26/2011  . Esophagitis, erosive 09/26/2011    Past Surgical History:  Procedure Laterality Date  . BACK SURGERY    . ESOPHAGOGASTRODUODENOSCOPY  09/26/2011   Procedure: ESOPHAGOGASTRODUODENOSCOPY (EGD);  Surgeon: Zenovia Jarred, MD;  Location: [facility] ENDOSCOPY;  Service: Gastroenterology;  Laterality: N/A;  To be done at bedside.  . ESOPHAGOGASTRODUODENOSCOPY N/A 09/30/2013   Procedure: ESOPHAGOGASTRODUODENOSCOPY (EGD);  Surgeon: [name], MD;  Location: [facility];  Service: Endoscopy;  Laterality: N/A;  . ESOPHAGOGASTRODUODENOSCOPY N/A 11/25/2013   Procedure: ESOPHAGOGASTRODUODENOSCOPY (EGD);  Surgeon: [name], MD;  Location: [facility] ENDOSCOPY;  Service: Endoscopy;  Laterality: N/A;  . HEMIARTHROPLASTY HIP Right 09/24/2007   Archie Endo 01/23/2011  . KYPHOPLASTY  06/2009   T12/notes 07/17/2009  . NISSEN FUNDOPLICATION     "had OR for GERD"  . TENDON REPAIR     Gluteus medius Archie Endo 01/23/2011  . THYROIDECTOMY    . TONSILLECTOMY       OB History   None      Home Medications    Prior to Admission medications   Medication Sig Start Date End Date Taking? Authorizing Provider  acetaminophen (TYLENOL) 325 MG tablet Take 650 mg by mouth every 6 (six) hours as needed for mild pain.   Yes [provider]  acetaminophen (TYLENOL) 500 MG tablet Take 500 mg by mouth 3 (three) times daily.    Yes [provider]  aspirin EC 81 MG tablet Take 81 mg by mouth daily.   Yes [provider]  carbidopa-levodopa (SINEMET IR) 25-100 MG tablet Take 1.5 tablets by mouth 3 (three) times daily. 9am, 3pm, 9pm   Yes [provider]  clotrimazole-betamethasone  (LOTRISONE) cream Apply 1 application topically 2 (two) times daily.   Yes [provider]  docusate sodium (COLACE) 100 MG capsule Take 100 mg by mouth daily. Reported on 02/28/2016   Yes [provider]  ferrous sulfate 325 (65 FE) MG tablet Take 325 mg by mouth daily. 10/22/16  Yes [provider]  furosemide (LASIX) 20 MG tablet Take 20 mg by mouth daily.  11/21/16 12/30/26 Yes [provider]  ipratropium-albuterol (DUONEB) 0.5-2.5 (3) MG/3ML SOLN Take 3 mLs by nebulization every 8 (eight) hours as needed (for shortness of breath/Wheezing).   Yes [provider]  latanoprost (XALATAN) 0.005 % ophthalmic solution Place 1 drop into the left eye daily at 6 PM.  02/23/15  Yes [provider]  lisinopril (PRINIVIL,ZESTRIL) 2.5 MG tablet Take 2.5 mg by mouth daily.   Yes [provider]  LORazepam (ATIVAN) 0.5  MG tablet Take 0.5 mg by mouth 2 (two) times daily.    Yes [provider]  metoprolol tartrate (LOPRESSOR) 25 MG tablet Take 0.5 tablets (12.5 mg total) by mouth 2 (two) times daily. 02/21/17 02/21/18 Yes Reed, [name] L, DO  metroNIDAZOLE (FLAGYL) 500 MG tablet 500 mg daily. Crushed to Wound Bed Daily   Yes [provider]  Morphine Sulfate (MORPHINE CONCENTRATE) 10 mg / 0.5 ml concentrated solution Take 5 mg by mouth every 3 (three) hours as needed for severe pain.   Yes [provider]  Multiple Vitamins-Minerals (MULTIVITAMIN WITH MINERALS) tablet Take 1 tablet by mouth daily.   Yes [provider]  Pimavanserin Tartrate (NUPLAZID) 17 MG TABS Take 2 tablets by mouth daily. 11/01/16  Yes Reed, [name] L, DO  Polyethyl Glycol-Propyl Glycol (SYSTANE OP) Place 1 drop into both eyes daily.    Yes [provider]  pramipexole (MIRAPEX) 1 MG tablet Take 1 tablet (1 mg total) by mouth 3 (three) times daily. 10/22/16  Yes Reed, [name] L, DO  traZODone (DESYREL) 50 MG tablet Take 25-50 mg by mouth See admin  instructions. Take 1/2 tablet twice daily then take 2 tablets at bedtime   Yes [provider]  bisoprolol (ZEBETA) 5 MG tablet Take 5 mg by mouth daily.    12/10/11  [provider]    Family History Family History  Problem Relation Age of Onset  . GER disease Mother   . Heart disease Father     Social History Social History   Tobacco Use  . Smoking status: Never Smoker  . Smokeless tobacco: Never Used  Substance Use Topics  . Alcohol use: No  . Drug use: No     Allergies   Codeine and Penicillins   Review of Systems Review of Systems  Unable to perform ROS: Patient nonverbal  Constitutional: Negative for chills and fever.  HENT: Negative for congestion and rhinorrhea.   Eyes: Negative for redness and visual disturbance.  Respiratory: Negative for shortness of breath and wheezing.   Cardiovascular: Negative for chest pain and palpitations.  Gastrointestinal: Negative for nausea and vomiting.  Genitourinary: Negative for dysuria and urgency.  Musculoskeletal: Negative for arthralgias and myalgias.  Skin: Negative for pallor and wound.  Neurological: Negative for dizziness and headaches.     Physical Exam Updated Vital Signs BP 126/75   Pulse 89   Temp 97.7 F (36.5 C) (Oral)   Resp 18   SpO2 97%   Physical Exam  Constitutional: She is oriented to person, place, and time. No distress.  Chronically ill-appearing  HENT:  Head: Normocephalic.  4 cm laceration to the left parietal region  Eyes: Pupils are equal, round, and reactive to light. EOM are normal.  Neck: Normal range of motion. Neck supple.  Cardiovascular: Normal rate and regular rhythm. Exam reveals no gallop and no friction rub.  No murmur heard. Pulmonary/Chest: Effort normal. She has no wheezes. She has no rales.  Abdominal: Soft. She exhibits no distension and no mass. There is no tenderness. There is no guarding.  Musculoskeletal: She exhibits no edema or tenderness.    Contracture of the bilateral lower extremities small skin tear to the right knee.  No crepitus or appreciable bony tenderness.  Multiple small skin tears to the left elbow full range of motion.  No bony tenderness.  Neurological: She is alert and oriented to person, place, and time.  Skin: Skin is warm and dry. She is not diaphoretic.  Psychiatric: She has a normal mood and affect. Her behavior is normal.  Nursing note and vitals reviewed.    ED Treatments / Results  Labs (all labs ordered are listed, but only abnormal results are displayed) Labs Reviewed - No data to display  EKG None  Radiology Ct Head Wo Contrast  Result Date: 12/29/2017 CLINICAL DATA:  77 year old female fell out of bed with laceration left forehead. History of dementia. No reported loss of consciousness. Initial encounter. EXAM: CT HEAD WITHOUT CONTRAST CT CERVICAL SPINE WITHOUT CONTRAST TECHNIQUE: Multidetector CT imaging of the head and cervical spine was performed following the standard protocol without intravenous contrast. Multiplanar CT image reconstructions of the cervical spine were also generated. COMPARISON:  10/15/2016 and 09/19/2016 CT. FINDINGS: CT HEAD FINDINGS Brain: No intracranial hemorrhage or CT evidence of large acute infarct. Remote left frontal lobe infarct. Chronic microvascular changes. Mild global atrophy. No intracranial mass lesion noted on this unenhanced exam. Vascular: Vascular calcifications Skull: No skull fracture. Sinuses/Orbits: No acute orbital abnormality. Visualized paranasal sinuses, mastoid air cells and middle ear cavities are clear. Other: Laceration left frontal-temporal region. Similar appearance of calcified subcutaneous lesions. CT CERVICAL SPINE FINDINGS Alignment: Rotation of the head and C1 upon C2. Slight curvature cervical spine convex right. Skull base and vertebrae: No cervical spine fracture noted. Soft tissues and spinal canal: No abnormal prevertebral soft tissue  swelling. Disc levels:  Cervical spondylotic changes most prominent C5-6. Upper chest: Scarring lung apices. Other: Post right thyroidectomy. Enlarged heterogeneous left lobe of thyroid gland with substernal extension incompletely assessed. This can be assessed/followed with thyroid ultrasound. Adducted left vocal cord. Etiology indeterminate. Direct visualization would be necessary to determine if there is vocal cord dysfunction if clinically desired. Carotid bifurcation calcifications. IMPRESSION: Laceration left frontal-temporal region without underlying skull fracture or intracranial hemorrhage. Remote left frontal lobe infarct. Chronic microvascular changes. Rotation of the head and C1 upon C2. Slight curvature cervical spine convex right. No cervical spine fracture or abnormal prevertebral soft tissue swelling noted. Cervical spondylotic changes most prominent C5-6. Post right thyroidectomy. Enlarged heterogeneous left lobe of thyroid gland with substernal extension incompletely assessed. This can be assessed/followed with thyroid ultrasound. Adducted left vocal cord. Etiology indeterminate. Direct visualization would be necessary to determine if there is vocal cord dysfunction if clinically desired. Electronically Signed   By: Genia Del M.D.   On: 12/29/2017 09:08   Ct Cervical Spine Wo Contrast  Result Date: 12/29/2017 CLINICAL DATA:  77 year old female fell out of bed with laceration left forehead. History of dementia. No reported loss of consciousness. Initial encounter. EXAM: CT HEAD WITHOUT CONTRAST CT CERVICAL SPINE WITHOUT CONTRAST TECHNIQUE: Multidetector CT imaging of the head and cervical spine was performed following the standard protocol without intravenous contrast. Multiplanar CT image reconstructions of the cervical spine were also generated. COMPARISON:  10/15/2016 and 09/19/2016 CT. FINDINGS: CT HEAD FINDINGS Brain: No intracranial hemorrhage or CT evidence of large acute infarct.  Remote left frontal lobe infarct. Chronic microvascular changes. Mild global atrophy. No intracranial mass lesion noted on this unenhanced exam. Vascular: Vascular calcifications Skull: No skull fracture. Sinuses/Orbits: No acute orbital abnormality. Visualized paranasal sinuses, mastoid air cells and middle ear cavities are clear. Other: Laceration left frontal-temporal region. Similar appearance of calcified subcutaneous lesions. CT CERVICAL SPINE FINDINGS Alignment: Rotation of the head and C1 upon C2. Slight curvature cervical spine convex right. Skull base and vertebrae: No cervical spine fracture noted. Soft tissues and spinal canal: No abnormal prevertebral soft tissue swelling. Disc levels:  Cervical spondylotic changes most prominent C5-6. Upper chest: Scarring lung apices. Other: Post right thyroidectomy. Enlarged heterogeneous left lobe of thyroid gland with substernal extension incompletely assessed. This can be assessed/followed with thyroid ultrasound. Adducted left vocal cord. Etiology indeterminate. Direct visualization would be necessary to determine if there is vocal cord dysfunction if clinically desired. Carotid bifurcation calcifications. IMPRESSION: Laceration left frontal-temporal region without underlying skull fracture or intracranial hemorrhage. Remote left frontal lobe infarct. Chronic microvascular changes. Rotation of the head and C1 upon C2. Slight curvature cervical spine convex right. No cervical spine fracture or abnormal prevertebral soft tissue swelling noted. Cervical spondylotic changes most prominent C5-6. Post right thyroidectomy. Enlarged heterogeneous left lobe of thyroid gland with substernal extension incompletely assessed. This can be assessed/followed with thyroid ultrasound. Adducted left vocal cord. Etiology indeterminate. Direct visualization would be necessary to determine if there is vocal cord dysfunction if clinically desired. Electronically Signed   By: Genia Del M.D.   On: 12/29/2017 09:08    Procedures .Marland KitchenLaceration Repair Date/Time: 12/29/2017 9:39 AM Performed by: Deno Etienne, DO Authorized by: Deno Etienne, DO   Consent:    Consent obtained:  Verbal   Consent given by:  Patient   Risks discussed:  Infection, pain, poor wound healing and poor cosmetic result   Alternatives discussed:  No treatment, delayed treatment and observation Anesthesia (see MAR for exact dosages):    Anesthesia method:  Local infiltration   Local anesthetic:  Lidocaine 1% WITH epi Laceration details:    Location:  Scalp   Scalp location:  L temporal   Length (cm):  7 Repair type:    Repair type:  Simple Pre-procedure details:    Preparation:  Patient was prepped and draped in usual sterile fashion and imaging obtained to evaluate for foreign bodies Exploration:    Hemostasis achieved with:  Epinephrine and direct pressure   Wound exploration: entire depth of wound probed and visualized     Contaminated: no   Treatment:    Wound cleansed with: Chlorhexidine.   Amount of cleaning:  Standard   Irrigation solution:  Sterile saline   Irrigation volume:  40   Irrigation method:  Syringe   Visualized foreign bodies/material removed: no   Skin repair:    Repair method:  Staples   Number of staples:  5 Approximation:    Approximation:  Close Post-procedure details:    Dressing:  Open (no dressing)   Patient tolerance of procedure:  Tolerated well, no immediate complications   (including critical care time)  Medications Ordered in ED Medications  lidocaine-EPINEPHrine (XYLOCAINE W/EPI) 1 %-1:100000 (with pres) injection 20 mL (20 mLs Intradermal Given by Other 12/29/17 [id number])     Initial Impression / Assessment and Plan / ED Course  I have reviewed the triage vital signs and the nursing notes.  Pertinent labs & imaging results that were available during my care of the patient were reviewed by me and considered in my medical decision making (see chart for  details).     77 yo F currently on hospice with a chief complaint of a fall out of bed.  The patient does have signs of head injury.  Will obtain a CT of the head and C-spine.  Imaging negative for acute fracture intracranial injury.  Patient was stapled at bedside.  PCP follow-up.  10:14 AM:  I have discussed the diagnosis/risks/treatment options with the patient and believe the pt to be eligible for discharge home to follow-up with PCP. We also discussed  returning to the ED immediately if new or worsening sx occur. We discussed the sx which are most concerning (e.g., sudden worsening pain, fever, inability to tolerate by mouth) that necessitate immediate return. Medications administered to the patient during their visit and any new prescriptions provided to the patient are listed below.  Medications given during this visit Medications  lidocaine-EPINEPHrine (XYLOCAINE W/EPI) 1 %-1:100000 (with pres) injection 20 mL (20 mLs Intradermal Given by Other 12/29/17 0454)     The patient appears reasonably screen and/or stabilized for discharge and I doubt any other medical condition or other [facility] requiring further screening, evaluation, or treatment in the ED at this time prior to discharge.    Final Clinical Impressions(s) / ED Diagnoses   Final diagnoses:  Fall, initial encounter  Scalp laceration, initial encounter    ED Discharge Orders    None       Deno Etienne, DO 12/29/17 [place], [place], DO 12/29/17 1014

## 2017-12-29 NOTE — Discharge Instructions (Signed)
Follow up with your PCP.  Have your staples taken out about day 5-7.  Return for redness, fever, drainage

## 2017-12-29 NOTE — ED Notes (Signed)
[place] in attempt to give report to facility. Transferred to [facility]'. No answer.

## 2017-12-29 NOTE — ED Triage Notes (Signed)
Pt coming from [facility] due to a fall out of bed this am. Pt had hx of dementia and care giver told pt to stay in bed pt got out of bed and fell and hit her head. Pt has a lac on the left forehead. Skin tear in the left elbow and left knee.

## 2017-12-29 NOTE — ED Notes (Signed)
Patient transported to CT 

## 2018-01-01 DIAGNOSIS — G3183 Dementia with Lewy bodies: Secondary | ICD-10-CM | POA: Diagnosis not present

## 2018-01-01 DIAGNOSIS — F0280 Dementia in other diseases classified elsewhere without behavioral disturbance: Secondary | ICD-10-CM | POA: Diagnosis not present

## 2018-01-01 DIAGNOSIS — F419 Anxiety disorder, unspecified: Secondary | ICD-10-CM | POA: Diagnosis not present

## 2018-01-01 DIAGNOSIS — G4700 Insomnia, unspecified: Secondary | ICD-10-CM | POA: Diagnosis not present

## 2018-01-16 DIAGNOSIS — F339 Major depressive disorder, recurrent, unspecified: Secondary | ICD-10-CM | POA: Diagnosis not present

## 2018-01-16 DIAGNOSIS — G3183 Dementia with Lewy bodies: Secondary | ICD-10-CM | POA: Diagnosis not present

## 2018-01-16 DIAGNOSIS — F419 Anxiety disorder, unspecified: Secondary | ICD-10-CM | POA: Diagnosis not present

## 2018-01-16 DIAGNOSIS — G20 Parkinson's disease: Secondary | ICD-10-CM | POA: Diagnosis not present

## 2018-01-21 DIAGNOSIS — R1310 Dysphagia, unspecified: Secondary | ICD-10-CM | POA: Diagnosis not present

## 2018-01-21 DIAGNOSIS — E785 Hyperlipidemia, unspecified: Secondary | ICD-10-CM | POA: Diagnosis not present

## 2018-01-21 DIAGNOSIS — L8990 Pressure ulcer of unspecified site, unspecified stage: Secondary | ICD-10-CM | POA: Diagnosis not present

## 2018-01-21 DIAGNOSIS — I679 Cerebrovascular disease, unspecified: Secondary | ICD-10-CM | POA: Diagnosis not present

## 2018-01-21 DIAGNOSIS — H11149 Conjunctival xerosis, unspecified, unspecified eye: Secondary | ICD-10-CM | POA: Diagnosis not present

## 2018-01-21 DIAGNOSIS — F411 Generalized anxiety disorder: Secondary | ICD-10-CM | POA: Diagnosis not present

## 2018-01-21 DIAGNOSIS — I10 Essential (primary) hypertension: Secondary | ICD-10-CM | POA: Diagnosis not present

## 2018-01-21 DIAGNOSIS — E039 Hypothyroidism, unspecified: Secondary | ICD-10-CM | POA: Diagnosis not present

## 2018-01-21 DIAGNOSIS — K219 Gastro-esophageal reflux disease without esophagitis: Secondary | ICD-10-CM | POA: Diagnosis not present

## 2018-01-21 DIAGNOSIS — I951 Orthostatic hypotension: Secondary | ICD-10-CM | POA: Diagnosis not present

## 2018-01-21 DIAGNOSIS — G2401 Drug induced subacute dyskinesia: Secondary | ICD-10-CM | POA: Diagnosis not present

## 2018-01-21 DIAGNOSIS — G20 Parkinson's disease: Secondary | ICD-10-CM | POA: Diagnosis not present

## 2018-01-21 DIAGNOSIS — H409 Unspecified glaucoma: Secondary | ICD-10-CM | POA: Diagnosis not present

## 2018-01-21 DIAGNOSIS — G3183 Dementia with Lewy bodies: Secondary | ICD-10-CM | POA: Diagnosis not present

## 2018-01-21 DIAGNOSIS — I509 Heart failure, unspecified: Secondary | ICD-10-CM | POA: Diagnosis not present

## 2018-01-22 DIAGNOSIS — G3183 Dementia with Lewy bodies: Secondary | ICD-10-CM | POA: Diagnosis not present

## 2018-01-22 DIAGNOSIS — G2401 Drug induced subacute dyskinesia: Secondary | ICD-10-CM | POA: Diagnosis not present

## 2018-01-22 DIAGNOSIS — I509 Heart failure, unspecified: Secondary | ICD-10-CM | POA: Diagnosis not present

## 2018-01-22 DIAGNOSIS — I951 Orthostatic hypotension: Secondary | ICD-10-CM | POA: Diagnosis not present

## 2018-01-22 DIAGNOSIS — G20 Parkinson's disease: Secondary | ICD-10-CM | POA: Diagnosis not present

## 2018-01-22 DIAGNOSIS — R1310 Dysphagia, unspecified: Secondary | ICD-10-CM | POA: Diagnosis not present

## 2018-01-23 DIAGNOSIS — G3183 Dementia with Lewy bodies: Secondary | ICD-10-CM | POA: Diagnosis not present

## 2018-01-23 DIAGNOSIS — G20 Parkinson's disease: Secondary | ICD-10-CM | POA: Diagnosis not present

## 2018-01-23 DIAGNOSIS — I509 Heart failure, unspecified: Secondary | ICD-10-CM | POA: Diagnosis not present

## 2018-01-23 DIAGNOSIS — R1310 Dysphagia, unspecified: Secondary | ICD-10-CM | POA: Diagnosis not present

## 2018-01-23 DIAGNOSIS — G2401 Drug induced subacute dyskinesia: Secondary | ICD-10-CM | POA: Diagnosis not present

## 2018-01-23 DIAGNOSIS — I951 Orthostatic hypotension: Secondary | ICD-10-CM | POA: Diagnosis not present

## 2018-01-26 DIAGNOSIS — R1310 Dysphagia, unspecified: Secondary | ICD-10-CM | POA: Diagnosis not present

## 2018-01-26 DIAGNOSIS — I509 Heart failure, unspecified: Secondary | ICD-10-CM | POA: Diagnosis not present

## 2018-01-26 DIAGNOSIS — G20 Parkinson's disease: Secondary | ICD-10-CM | POA: Diagnosis not present

## 2018-01-26 DIAGNOSIS — G2401 Drug induced subacute dyskinesia: Secondary | ICD-10-CM | POA: Diagnosis not present

## 2018-01-26 DIAGNOSIS — I951 Orthostatic hypotension: Secondary | ICD-10-CM | POA: Diagnosis not present

## 2018-01-26 DIAGNOSIS — G3183 Dementia with Lewy bodies: Secondary | ICD-10-CM | POA: Diagnosis not present

## 2018-01-27 DIAGNOSIS — I509 Heart failure, unspecified: Secondary | ICD-10-CM | POA: Diagnosis not present

## 2018-01-27 DIAGNOSIS — G3183 Dementia with Lewy bodies: Secondary | ICD-10-CM | POA: Diagnosis not present

## 2018-01-27 DIAGNOSIS — G2401 Drug induced subacute dyskinesia: Secondary | ICD-10-CM | POA: Diagnosis not present

## 2018-01-27 DIAGNOSIS — G20 Parkinson's disease: Secondary | ICD-10-CM | POA: Diagnosis not present

## 2018-01-27 DIAGNOSIS — I951 Orthostatic hypotension: Secondary | ICD-10-CM | POA: Diagnosis not present

## 2018-01-27 DIAGNOSIS — R1310 Dysphagia, unspecified: Secondary | ICD-10-CM | POA: Diagnosis not present

## 2018-01-28 DIAGNOSIS — G2401 Drug induced subacute dyskinesia: Secondary | ICD-10-CM | POA: Diagnosis not present

## 2018-01-28 DIAGNOSIS — I509 Heart failure, unspecified: Secondary | ICD-10-CM | POA: Diagnosis not present

## 2018-01-28 DIAGNOSIS — G20 Parkinson's disease: Secondary | ICD-10-CM | POA: Diagnosis not present

## 2018-01-28 DIAGNOSIS — I951 Orthostatic hypotension: Secondary | ICD-10-CM | POA: Diagnosis not present

## 2018-01-28 DIAGNOSIS — G3183 Dementia with Lewy bodies: Secondary | ICD-10-CM | POA: Diagnosis not present

## 2018-01-28 DIAGNOSIS — R1310 Dysphagia, unspecified: Secondary | ICD-10-CM | POA: Diagnosis not present

## 2018-02-02 DIAGNOSIS — G2401 Drug induced subacute dyskinesia: Secondary | ICD-10-CM | POA: Diagnosis not present

## 2018-02-02 DIAGNOSIS — I951 Orthostatic hypotension: Secondary | ICD-10-CM | POA: Diagnosis not present

## 2018-02-02 DIAGNOSIS — G3183 Dementia with Lewy bodies: Secondary | ICD-10-CM | POA: Diagnosis not present

## 2018-02-02 DIAGNOSIS — R1310 Dysphagia, unspecified: Secondary | ICD-10-CM | POA: Diagnosis not present

## 2018-02-02 DIAGNOSIS — G20 Parkinson's disease: Secondary | ICD-10-CM | POA: Diagnosis not present

## 2018-02-02 DIAGNOSIS — I509 Heart failure, unspecified: Secondary | ICD-10-CM | POA: Diagnosis not present

## 2018-02-03 DIAGNOSIS — R1310 Dysphagia, unspecified: Secondary | ICD-10-CM | POA: Diagnosis not present

## 2018-02-03 DIAGNOSIS — I509 Heart failure, unspecified: Secondary | ICD-10-CM | POA: Diagnosis not present

## 2018-02-03 DIAGNOSIS — G20 Parkinson's disease: Secondary | ICD-10-CM | POA: Diagnosis not present

## 2018-02-03 DIAGNOSIS — I951 Orthostatic hypotension: Secondary | ICD-10-CM | POA: Diagnosis not present

## 2018-02-03 DIAGNOSIS — G3183 Dementia with Lewy bodies: Secondary | ICD-10-CM | POA: Diagnosis not present

## 2018-02-03 DIAGNOSIS — G2401 Drug induced subacute dyskinesia: Secondary | ICD-10-CM | POA: Diagnosis not present

## 2018-02-04 DIAGNOSIS — G20 Parkinson's disease: Secondary | ICD-10-CM | POA: Diagnosis not present

## 2018-02-04 DIAGNOSIS — G2401 Drug induced subacute dyskinesia: Secondary | ICD-10-CM | POA: Diagnosis not present

## 2018-02-04 DIAGNOSIS — I509 Heart failure, unspecified: Secondary | ICD-10-CM | POA: Diagnosis not present

## 2018-02-04 DIAGNOSIS — G3183 Dementia with Lewy bodies: Secondary | ICD-10-CM | POA: Diagnosis not present

## 2018-02-04 DIAGNOSIS — I951 Orthostatic hypotension: Secondary | ICD-10-CM | POA: Diagnosis not present

## 2018-02-04 DIAGNOSIS — R1310 Dysphagia, unspecified: Secondary | ICD-10-CM | POA: Diagnosis not present

## 2018-02-09 DIAGNOSIS — G3183 Dementia with Lewy bodies: Secondary | ICD-10-CM | POA: Diagnosis not present

## 2018-02-09 DIAGNOSIS — G2401 Drug induced subacute dyskinesia: Secondary | ICD-10-CM | POA: Diagnosis not present

## 2018-02-09 DIAGNOSIS — R1310 Dysphagia, unspecified: Secondary | ICD-10-CM | POA: Diagnosis not present

## 2018-02-09 DIAGNOSIS — I951 Orthostatic hypotension: Secondary | ICD-10-CM | POA: Diagnosis not present

## 2018-02-09 DIAGNOSIS — I509 Heart failure, unspecified: Secondary | ICD-10-CM | POA: Diagnosis not present

## 2018-02-09 DIAGNOSIS — G20 Parkinson's disease: Secondary | ICD-10-CM | POA: Diagnosis not present

## 2018-02-11 DIAGNOSIS — G2401 Drug induced subacute dyskinesia: Secondary | ICD-10-CM | POA: Diagnosis not present

## 2018-02-11 DIAGNOSIS — R1310 Dysphagia, unspecified: Secondary | ICD-10-CM | POA: Diagnosis not present

## 2018-02-11 DIAGNOSIS — G3183 Dementia with Lewy bodies: Secondary | ICD-10-CM | POA: Diagnosis not present

## 2018-02-11 DIAGNOSIS — I509 Heart failure, unspecified: Secondary | ICD-10-CM | POA: Diagnosis not present

## 2018-02-11 DIAGNOSIS — G20 Parkinson's disease: Secondary | ICD-10-CM | POA: Diagnosis not present

## 2018-02-11 DIAGNOSIS — I951 Orthostatic hypotension: Secondary | ICD-10-CM | POA: Diagnosis not present

## 2018-02-18 DIAGNOSIS — R1310 Dysphagia, unspecified: Secondary | ICD-10-CM | POA: Diagnosis not present

## 2018-02-18 DIAGNOSIS — G20 Parkinson's disease: Secondary | ICD-10-CM | POA: Diagnosis not present

## 2018-02-18 DIAGNOSIS — I509 Heart failure, unspecified: Secondary | ICD-10-CM | POA: Diagnosis not present

## 2018-02-18 DIAGNOSIS — G3183 Dementia with Lewy bodies: Secondary | ICD-10-CM | POA: Diagnosis not present

## 2018-02-18 DIAGNOSIS — G2401 Drug induced subacute dyskinesia: Secondary | ICD-10-CM | POA: Diagnosis not present

## 2018-02-18 DIAGNOSIS — I951 Orthostatic hypotension: Secondary | ICD-10-CM | POA: Diagnosis not present

## 2018-02-20 DIAGNOSIS — G20 Parkinson's disease: Secondary | ICD-10-CM | POA: Diagnosis not present

## 2018-02-20 DIAGNOSIS — I951 Orthostatic hypotension: Secondary | ICD-10-CM | POA: Diagnosis not present

## 2018-02-20 DIAGNOSIS — G2401 Drug induced subacute dyskinesia: Secondary | ICD-10-CM | POA: Diagnosis not present

## 2018-02-20 DIAGNOSIS — R1310 Dysphagia, unspecified: Secondary | ICD-10-CM | POA: Diagnosis not present

## 2018-02-20 DIAGNOSIS — I509 Heart failure, unspecified: Secondary | ICD-10-CM | POA: Diagnosis not present

## 2018-02-20 DIAGNOSIS — G3183 Dementia with Lewy bodies: Secondary | ICD-10-CM | POA: Diagnosis not present

## 2018-02-21 DIAGNOSIS — E785 Hyperlipidemia, unspecified: Secondary | ICD-10-CM | POA: Diagnosis not present

## 2018-02-21 DIAGNOSIS — K219 Gastro-esophageal reflux disease without esophagitis: Secondary | ICD-10-CM | POA: Diagnosis not present

## 2018-02-21 DIAGNOSIS — R1310 Dysphagia, unspecified: Secondary | ICD-10-CM | POA: Diagnosis not present

## 2018-02-21 DIAGNOSIS — I951 Orthostatic hypotension: Secondary | ICD-10-CM | POA: Diagnosis not present

## 2018-02-21 DIAGNOSIS — I679 Cerebrovascular disease, unspecified: Secondary | ICD-10-CM | POA: Diagnosis not present

## 2018-02-21 DIAGNOSIS — E039 Hypothyroidism, unspecified: Secondary | ICD-10-CM | POA: Diagnosis not present

## 2018-02-21 DIAGNOSIS — G2401 Drug induced subacute dyskinesia: Secondary | ICD-10-CM | POA: Diagnosis not present

## 2018-02-21 DIAGNOSIS — G20 Parkinson's disease: Secondary | ICD-10-CM | POA: Diagnosis not present

## 2018-02-21 DIAGNOSIS — H409 Unspecified glaucoma: Secondary | ICD-10-CM | POA: Diagnosis not present

## 2018-02-21 DIAGNOSIS — G3183 Dementia with Lewy bodies: Secondary | ICD-10-CM | POA: Diagnosis not present

## 2018-02-21 DIAGNOSIS — L8990 Pressure ulcer of unspecified site, unspecified stage: Secondary | ICD-10-CM | POA: Diagnosis not present

## 2018-02-21 DIAGNOSIS — F411 Generalized anxiety disorder: Secondary | ICD-10-CM | POA: Diagnosis not present

## 2018-02-21 DIAGNOSIS — I509 Heart failure, unspecified: Secondary | ICD-10-CM | POA: Diagnosis not present

## 2018-02-21 DIAGNOSIS — I10 Essential (primary) hypertension: Secondary | ICD-10-CM | POA: Diagnosis not present

## 2018-02-21 DIAGNOSIS — H11149 Conjunctival xerosis, unspecified, unspecified eye: Secondary | ICD-10-CM | POA: Diagnosis not present

## 2018-02-23 DIAGNOSIS — G20 Parkinson's disease: Secondary | ICD-10-CM | POA: Diagnosis not present

## 2018-02-23 DIAGNOSIS — R1310 Dysphagia, unspecified: Secondary | ICD-10-CM | POA: Diagnosis not present

## 2018-02-23 DIAGNOSIS — G3183 Dementia with Lewy bodies: Secondary | ICD-10-CM | POA: Diagnosis not present

## 2018-02-23 DIAGNOSIS — I951 Orthostatic hypotension: Secondary | ICD-10-CM | POA: Diagnosis not present

## 2018-02-23 DIAGNOSIS — G2401 Drug induced subacute dyskinesia: Secondary | ICD-10-CM | POA: Diagnosis not present

## 2018-02-23 DIAGNOSIS — I509 Heart failure, unspecified: Secondary | ICD-10-CM | POA: Diagnosis not present

## 2018-02-24 DIAGNOSIS — G20 Parkinson's disease: Secondary | ICD-10-CM | POA: Diagnosis not present

## 2018-02-24 DIAGNOSIS — G3183 Dementia with Lewy bodies: Secondary | ICD-10-CM | POA: Diagnosis not present

## 2018-02-24 DIAGNOSIS — I951 Orthostatic hypotension: Secondary | ICD-10-CM | POA: Diagnosis not present

## 2018-02-24 DIAGNOSIS — R1310 Dysphagia, unspecified: Secondary | ICD-10-CM | POA: Diagnosis not present

## 2018-02-24 DIAGNOSIS — I509 Heart failure, unspecified: Secondary | ICD-10-CM | POA: Diagnosis not present

## 2018-02-24 DIAGNOSIS — G2401 Drug induced subacute dyskinesia: Secondary | ICD-10-CM | POA: Diagnosis not present

## 2018-02-25 DIAGNOSIS — R1310 Dysphagia, unspecified: Secondary | ICD-10-CM | POA: Diagnosis not present

## 2018-02-25 DIAGNOSIS — I509 Heart failure, unspecified: Secondary | ICD-10-CM | POA: Diagnosis not present

## 2018-02-25 DIAGNOSIS — G2401 Drug induced subacute dyskinesia: Secondary | ICD-10-CM | POA: Diagnosis not present

## 2018-02-25 DIAGNOSIS — G20 Parkinson's disease: Secondary | ICD-10-CM | POA: Diagnosis not present

## 2018-02-25 DIAGNOSIS — I951 Orthostatic hypotension: Secondary | ICD-10-CM | POA: Diagnosis not present

## 2018-02-25 DIAGNOSIS — G3183 Dementia with Lewy bodies: Secondary | ICD-10-CM | POA: Diagnosis not present

## 2018-03-02 DIAGNOSIS — I509 Heart failure, unspecified: Secondary | ICD-10-CM | POA: Diagnosis not present

## 2018-03-02 DIAGNOSIS — G3183 Dementia with Lewy bodies: Secondary | ICD-10-CM | POA: Diagnosis not present

## 2018-03-02 DIAGNOSIS — G2401 Drug induced subacute dyskinesia: Secondary | ICD-10-CM | POA: Diagnosis not present

## 2018-03-02 DIAGNOSIS — G20 Parkinson's disease: Secondary | ICD-10-CM | POA: Diagnosis not present

## 2018-03-02 DIAGNOSIS — I951 Orthostatic hypotension: Secondary | ICD-10-CM | POA: Diagnosis not present

## 2018-03-02 DIAGNOSIS — R1310 Dysphagia, unspecified: Secondary | ICD-10-CM | POA: Diagnosis not present

## 2018-03-03 DIAGNOSIS — G20 Parkinson's disease: Secondary | ICD-10-CM | POA: Diagnosis not present

## 2018-03-03 DIAGNOSIS — I509 Heart failure, unspecified: Secondary | ICD-10-CM | POA: Diagnosis not present

## 2018-03-03 DIAGNOSIS — G2401 Drug induced subacute dyskinesia: Secondary | ICD-10-CM | POA: Diagnosis not present

## 2018-03-03 DIAGNOSIS — R1310 Dysphagia, unspecified: Secondary | ICD-10-CM | POA: Diagnosis not present

## 2018-03-03 DIAGNOSIS — I951 Orthostatic hypotension: Secondary | ICD-10-CM | POA: Diagnosis not present

## 2018-03-03 DIAGNOSIS — G3183 Dementia with Lewy bodies: Secondary | ICD-10-CM | POA: Diagnosis not present

## 2018-03-04 DIAGNOSIS — G20 Parkinson's disease: Secondary | ICD-10-CM | POA: Diagnosis not present

## 2018-03-04 DIAGNOSIS — G3183 Dementia with Lewy bodies: Secondary | ICD-10-CM | POA: Diagnosis not present

## 2018-03-04 DIAGNOSIS — I951 Orthostatic hypotension: Secondary | ICD-10-CM | POA: Diagnosis not present

## 2018-03-04 DIAGNOSIS — R1310 Dysphagia, unspecified: Secondary | ICD-10-CM | POA: Diagnosis not present

## 2018-03-04 DIAGNOSIS — I509 Heart failure, unspecified: Secondary | ICD-10-CM | POA: Diagnosis not present

## 2018-03-04 DIAGNOSIS — G2401 Drug induced subacute dyskinesia: Secondary | ICD-10-CM | POA: Diagnosis not present

## 2018-03-05 DIAGNOSIS — I951 Orthostatic hypotension: Secondary | ICD-10-CM | POA: Diagnosis not present

## 2018-03-05 DIAGNOSIS — I509 Heart failure, unspecified: Secondary | ICD-10-CM | POA: Diagnosis not present

## 2018-03-05 DIAGNOSIS — G3183 Dementia with Lewy bodies: Secondary | ICD-10-CM | POA: Diagnosis not present

## 2018-03-05 DIAGNOSIS — R1310 Dysphagia, unspecified: Secondary | ICD-10-CM | POA: Diagnosis not present

## 2018-03-05 DIAGNOSIS — G2401 Drug induced subacute dyskinesia: Secondary | ICD-10-CM | POA: Diagnosis not present

## 2018-03-05 DIAGNOSIS — G20 Parkinson's disease: Secondary | ICD-10-CM | POA: Diagnosis not present

## 2018-03-09 DIAGNOSIS — I509 Heart failure, unspecified: Secondary | ICD-10-CM | POA: Diagnosis not present

## 2018-03-09 DIAGNOSIS — G3183 Dementia with Lewy bodies: Secondary | ICD-10-CM | POA: Diagnosis not present

## 2018-03-09 DIAGNOSIS — G20 Parkinson's disease: Secondary | ICD-10-CM | POA: Diagnosis not present

## 2018-03-09 DIAGNOSIS — G2401 Drug induced subacute dyskinesia: Secondary | ICD-10-CM | POA: Diagnosis not present

## 2018-03-09 DIAGNOSIS — I951 Orthostatic hypotension: Secondary | ICD-10-CM | POA: Diagnosis not present

## 2018-03-09 DIAGNOSIS — R1310 Dysphagia, unspecified: Secondary | ICD-10-CM | POA: Diagnosis not present

## 2018-03-10 DIAGNOSIS — R1310 Dysphagia, unspecified: Secondary | ICD-10-CM | POA: Diagnosis not present

## 2018-03-10 DIAGNOSIS — I951 Orthostatic hypotension: Secondary | ICD-10-CM | POA: Diagnosis not present

## 2018-03-10 DIAGNOSIS — I509 Heart failure, unspecified: Secondary | ICD-10-CM | POA: Diagnosis not present

## 2018-03-10 DIAGNOSIS — G3183 Dementia with Lewy bodies: Secondary | ICD-10-CM | POA: Diagnosis not present

## 2018-03-10 DIAGNOSIS — G20 Parkinson's disease: Secondary | ICD-10-CM | POA: Diagnosis not present

## 2018-03-10 DIAGNOSIS — G2401 Drug induced subacute dyskinesia: Secondary | ICD-10-CM | POA: Diagnosis not present

## 2018-03-11 DIAGNOSIS — R1310 Dysphagia, unspecified: Secondary | ICD-10-CM | POA: Diagnosis not present

## 2018-03-11 DIAGNOSIS — I951 Orthostatic hypotension: Secondary | ICD-10-CM | POA: Diagnosis not present

## 2018-03-11 DIAGNOSIS — F329 Major depressive disorder, single episode, unspecified: Secondary | ICD-10-CM | POA: Diagnosis not present

## 2018-03-11 DIAGNOSIS — I509 Heart failure, unspecified: Secondary | ICD-10-CM | POA: Diagnosis not present

## 2018-03-11 DIAGNOSIS — G20 Parkinson's disease: Secondary | ICD-10-CM | POA: Diagnosis not present

## 2018-03-11 DIAGNOSIS — G3183 Dementia with Lewy bodies: Secondary | ICD-10-CM | POA: Diagnosis not present

## 2018-03-11 DIAGNOSIS — G2401 Drug induced subacute dyskinesia: Secondary | ICD-10-CM | POA: Diagnosis not present

## 2018-03-11 DIAGNOSIS — F0391 Unspecified dementia with behavioral disturbance: Secondary | ICD-10-CM | POA: Diagnosis not present

## 2018-03-11 DIAGNOSIS — F411 Generalized anxiety disorder: Secondary | ICD-10-CM | POA: Diagnosis not present

## 2018-03-16 DIAGNOSIS — G2401 Drug induced subacute dyskinesia: Secondary | ICD-10-CM | POA: Diagnosis not present

## 2018-03-16 DIAGNOSIS — G3183 Dementia with Lewy bodies: Secondary | ICD-10-CM | POA: Diagnosis not present

## 2018-03-16 DIAGNOSIS — G20 Parkinson's disease: Secondary | ICD-10-CM | POA: Diagnosis not present

## 2018-03-16 DIAGNOSIS — I509 Heart failure, unspecified: Secondary | ICD-10-CM | POA: Diagnosis not present

## 2018-03-16 DIAGNOSIS — R1310 Dysphagia, unspecified: Secondary | ICD-10-CM | POA: Diagnosis not present

## 2018-03-16 DIAGNOSIS — I951 Orthostatic hypotension: Secondary | ICD-10-CM | POA: Diagnosis not present

## 2018-03-18 DIAGNOSIS — G20 Parkinson's disease: Secondary | ICD-10-CM | POA: Diagnosis not present

## 2018-03-18 DIAGNOSIS — R1310 Dysphagia, unspecified: Secondary | ICD-10-CM | POA: Diagnosis not present

## 2018-03-18 DIAGNOSIS — I951 Orthostatic hypotension: Secondary | ICD-10-CM | POA: Diagnosis not present

## 2018-03-18 DIAGNOSIS — G2401 Drug induced subacute dyskinesia: Secondary | ICD-10-CM | POA: Diagnosis not present

## 2018-03-18 DIAGNOSIS — I509 Heart failure, unspecified: Secondary | ICD-10-CM | POA: Diagnosis not present

## 2018-03-18 DIAGNOSIS — G3183 Dementia with Lewy bodies: Secondary | ICD-10-CM | POA: Diagnosis not present

## 2018-03-23 DIAGNOSIS — E785 Hyperlipidemia, unspecified: Secondary | ICD-10-CM | POA: Diagnosis not present

## 2018-03-23 DIAGNOSIS — I679 Cerebrovascular disease, unspecified: Secondary | ICD-10-CM | POA: Diagnosis not present

## 2018-03-23 DIAGNOSIS — I509 Heart failure, unspecified: Secondary | ICD-10-CM | POA: Diagnosis not present

## 2018-03-23 DIAGNOSIS — H11149 Conjunctival xerosis, unspecified, unspecified eye: Secondary | ICD-10-CM | POA: Diagnosis not present

## 2018-03-23 DIAGNOSIS — G3183 Dementia with Lewy bodies: Secondary | ICD-10-CM | POA: Diagnosis not present

## 2018-03-23 DIAGNOSIS — I951 Orthostatic hypotension: Secondary | ICD-10-CM | POA: Diagnosis not present

## 2018-03-23 DIAGNOSIS — E039 Hypothyroidism, unspecified: Secondary | ICD-10-CM | POA: Diagnosis not present

## 2018-03-23 DIAGNOSIS — G20 Parkinson's disease: Secondary | ICD-10-CM | POA: Diagnosis not present

## 2018-03-23 DIAGNOSIS — K219 Gastro-esophageal reflux disease without esophagitis: Secondary | ICD-10-CM | POA: Diagnosis not present

## 2018-03-23 DIAGNOSIS — H409 Unspecified glaucoma: Secondary | ICD-10-CM | POA: Diagnosis not present

## 2018-03-23 DIAGNOSIS — R1310 Dysphagia, unspecified: Secondary | ICD-10-CM | POA: Diagnosis not present

## 2018-03-23 DIAGNOSIS — L8990 Pressure ulcer of unspecified site, unspecified stage: Secondary | ICD-10-CM | POA: Diagnosis not present

## 2018-03-23 DIAGNOSIS — G2401 Drug induced subacute dyskinesia: Secondary | ICD-10-CM | POA: Diagnosis not present

## 2018-03-23 DIAGNOSIS — F411 Generalized anxiety disorder: Secondary | ICD-10-CM | POA: Diagnosis not present

## 2018-03-23 DIAGNOSIS — I10 Essential (primary) hypertension: Secondary | ICD-10-CM | POA: Diagnosis not present

## 2018-03-25 DIAGNOSIS — G2401 Drug induced subacute dyskinesia: Secondary | ICD-10-CM | POA: Diagnosis not present

## 2018-03-25 DIAGNOSIS — G20 Parkinson's disease: Secondary | ICD-10-CM | POA: Diagnosis not present

## 2018-03-25 DIAGNOSIS — R1310 Dysphagia, unspecified: Secondary | ICD-10-CM | POA: Diagnosis not present

## 2018-03-25 DIAGNOSIS — I951 Orthostatic hypotension: Secondary | ICD-10-CM | POA: Diagnosis not present

## 2018-03-25 DIAGNOSIS — G3183 Dementia with Lewy bodies: Secondary | ICD-10-CM | POA: Diagnosis not present

## 2018-03-25 DIAGNOSIS — I509 Heart failure, unspecified: Secondary | ICD-10-CM | POA: Diagnosis not present

## 2018-03-27 DIAGNOSIS — I951 Orthostatic hypotension: Secondary | ICD-10-CM | POA: Diagnosis not present

## 2018-03-27 DIAGNOSIS — I509 Heart failure, unspecified: Secondary | ICD-10-CM | POA: Diagnosis not present

## 2018-03-27 DIAGNOSIS — G3183 Dementia with Lewy bodies: Secondary | ICD-10-CM | POA: Diagnosis not present

## 2018-03-27 DIAGNOSIS — G20 Parkinson's disease: Secondary | ICD-10-CM | POA: Diagnosis not present

## 2018-03-27 DIAGNOSIS — G2401 Drug induced subacute dyskinesia: Secondary | ICD-10-CM | POA: Diagnosis not present

## 2018-03-27 DIAGNOSIS — R1310 Dysphagia, unspecified: Secondary | ICD-10-CM | POA: Diagnosis not present

## 2018-03-30 DIAGNOSIS — I951 Orthostatic hypotension: Secondary | ICD-10-CM | POA: Diagnosis not present

## 2018-03-30 DIAGNOSIS — G3183 Dementia with Lewy bodies: Secondary | ICD-10-CM | POA: Diagnosis not present

## 2018-03-30 DIAGNOSIS — R1310 Dysphagia, unspecified: Secondary | ICD-10-CM | POA: Diagnosis not present

## 2018-03-30 DIAGNOSIS — I509 Heart failure, unspecified: Secondary | ICD-10-CM | POA: Diagnosis not present

## 2018-03-30 DIAGNOSIS — G20 Parkinson's disease: Secondary | ICD-10-CM | POA: Diagnosis not present

## 2018-03-30 DIAGNOSIS — G2401 Drug induced subacute dyskinesia: Secondary | ICD-10-CM | POA: Diagnosis not present

## 2018-03-31 DIAGNOSIS — Z66 Do not resuscitate: Secondary | ICD-10-CM | POA: Diagnosis not present

## 2018-03-31 DIAGNOSIS — I10 Essential (primary) hypertension: Secondary | ICD-10-CM | POA: Diagnosis not present

## 2018-03-31 DIAGNOSIS — E039 Hypothyroidism, unspecified: Secondary | ICD-10-CM | POA: Diagnosis not present

## 2018-03-31 DIAGNOSIS — G20 Parkinson's disease: Secondary | ICD-10-CM | POA: Diagnosis not present

## 2018-04-01 DIAGNOSIS — G20 Parkinson's disease: Secondary | ICD-10-CM | POA: Diagnosis not present

## 2018-04-01 DIAGNOSIS — I509 Heart failure, unspecified: Secondary | ICD-10-CM | POA: Diagnosis not present

## 2018-04-01 DIAGNOSIS — G2401 Drug induced subacute dyskinesia: Secondary | ICD-10-CM | POA: Diagnosis not present

## 2018-04-01 DIAGNOSIS — I951 Orthostatic hypotension: Secondary | ICD-10-CM | POA: Diagnosis not present

## 2018-04-01 DIAGNOSIS — G3183 Dementia with Lewy bodies: Secondary | ICD-10-CM | POA: Diagnosis not present

## 2018-04-01 DIAGNOSIS — R1310 Dysphagia, unspecified: Secondary | ICD-10-CM | POA: Diagnosis not present

## 2018-04-02 DIAGNOSIS — G2401 Drug induced subacute dyskinesia: Secondary | ICD-10-CM | POA: Diagnosis not present

## 2018-04-02 DIAGNOSIS — G3183 Dementia with Lewy bodies: Secondary | ICD-10-CM | POA: Diagnosis not present

## 2018-04-02 DIAGNOSIS — I509 Heart failure, unspecified: Secondary | ICD-10-CM | POA: Diagnosis not present

## 2018-04-02 DIAGNOSIS — I951 Orthostatic hypotension: Secondary | ICD-10-CM | POA: Diagnosis not present

## 2018-04-02 DIAGNOSIS — G20 Parkinson's disease: Secondary | ICD-10-CM | POA: Diagnosis not present

## 2018-04-02 DIAGNOSIS — R1310 Dysphagia, unspecified: Secondary | ICD-10-CM | POA: Diagnosis not present

## 2018-04-06 DIAGNOSIS — I951 Orthostatic hypotension: Secondary | ICD-10-CM | POA: Diagnosis not present

## 2018-04-06 DIAGNOSIS — G20 Parkinson's disease: Secondary | ICD-10-CM | POA: Diagnosis not present

## 2018-04-06 DIAGNOSIS — R1310 Dysphagia, unspecified: Secondary | ICD-10-CM | POA: Diagnosis not present

## 2018-04-06 DIAGNOSIS — I509 Heart failure, unspecified: Secondary | ICD-10-CM | POA: Diagnosis not present

## 2018-04-06 DIAGNOSIS — G3183 Dementia with Lewy bodies: Secondary | ICD-10-CM | POA: Diagnosis not present

## 2018-04-06 DIAGNOSIS — G2401 Drug induced subacute dyskinesia: Secondary | ICD-10-CM | POA: Diagnosis not present

## 2018-04-08 DIAGNOSIS — R1310 Dysphagia, unspecified: Secondary | ICD-10-CM | POA: Diagnosis not present

## 2018-04-08 DIAGNOSIS — I509 Heart failure, unspecified: Secondary | ICD-10-CM | POA: Diagnosis not present

## 2018-04-08 DIAGNOSIS — G3183 Dementia with Lewy bodies: Secondary | ICD-10-CM | POA: Diagnosis not present

## 2018-04-08 DIAGNOSIS — G20 Parkinson's disease: Secondary | ICD-10-CM | POA: Diagnosis not present

## 2018-04-08 DIAGNOSIS — G2401 Drug induced subacute dyskinesia: Secondary | ICD-10-CM | POA: Diagnosis not present

## 2018-04-08 DIAGNOSIS — I951 Orthostatic hypotension: Secondary | ICD-10-CM | POA: Diagnosis not present

## 2018-04-13 DIAGNOSIS — G20 Parkinson's disease: Secondary | ICD-10-CM | POA: Diagnosis not present

## 2018-04-13 DIAGNOSIS — I509 Heart failure, unspecified: Secondary | ICD-10-CM | POA: Diagnosis not present

## 2018-04-13 DIAGNOSIS — R1310 Dysphagia, unspecified: Secondary | ICD-10-CM | POA: Diagnosis not present

## 2018-04-13 DIAGNOSIS — G2401 Drug induced subacute dyskinesia: Secondary | ICD-10-CM | POA: Diagnosis not present

## 2018-04-13 DIAGNOSIS — G3183 Dementia with Lewy bodies: Secondary | ICD-10-CM | POA: Diagnosis not present

## 2018-04-13 DIAGNOSIS — I951 Orthostatic hypotension: Secondary | ICD-10-CM | POA: Diagnosis not present

## 2018-04-14 DIAGNOSIS — G2401 Drug induced subacute dyskinesia: Secondary | ICD-10-CM | POA: Diagnosis not present

## 2018-04-14 DIAGNOSIS — I509 Heart failure, unspecified: Secondary | ICD-10-CM | POA: Diagnosis not present

## 2018-04-14 DIAGNOSIS — R1310 Dysphagia, unspecified: Secondary | ICD-10-CM | POA: Diagnosis not present

## 2018-04-14 DIAGNOSIS — G20 Parkinson's disease: Secondary | ICD-10-CM | POA: Diagnosis not present

## 2018-04-14 DIAGNOSIS — I951 Orthostatic hypotension: Secondary | ICD-10-CM | POA: Diagnosis not present

## 2018-04-14 DIAGNOSIS — G3183 Dementia with Lewy bodies: Secondary | ICD-10-CM | POA: Diagnosis not present

## 2018-04-15 DIAGNOSIS — G3183 Dementia with Lewy bodies: Secondary | ICD-10-CM | POA: Diagnosis not present

## 2018-04-15 DIAGNOSIS — I509 Heart failure, unspecified: Secondary | ICD-10-CM | POA: Diagnosis not present

## 2018-04-15 DIAGNOSIS — R1310 Dysphagia, unspecified: Secondary | ICD-10-CM | POA: Diagnosis not present

## 2018-04-15 DIAGNOSIS — G2401 Drug induced subacute dyskinesia: Secondary | ICD-10-CM | POA: Diagnosis not present

## 2018-04-15 DIAGNOSIS — I951 Orthostatic hypotension: Secondary | ICD-10-CM | POA: Diagnosis not present

## 2018-04-15 DIAGNOSIS — G20 Parkinson's disease: Secondary | ICD-10-CM | POA: Diagnosis not present

## 2018-04-16 DIAGNOSIS — I951 Orthostatic hypotension: Secondary | ICD-10-CM | POA: Diagnosis not present

## 2018-04-16 DIAGNOSIS — G3183 Dementia with Lewy bodies: Secondary | ICD-10-CM | POA: Diagnosis not present

## 2018-04-16 DIAGNOSIS — R1310 Dysphagia, unspecified: Secondary | ICD-10-CM | POA: Diagnosis not present

## 2018-04-16 DIAGNOSIS — I509 Heart failure, unspecified: Secondary | ICD-10-CM | POA: Diagnosis not present

## 2018-04-16 DIAGNOSIS — G20 Parkinson's disease: Secondary | ICD-10-CM | POA: Diagnosis not present

## 2018-04-16 DIAGNOSIS — G2401 Drug induced subacute dyskinesia: Secondary | ICD-10-CM | POA: Diagnosis not present

## 2018-04-20 DIAGNOSIS — G3183 Dementia with Lewy bodies: Secondary | ICD-10-CM | POA: Diagnosis not present

## 2018-04-20 DIAGNOSIS — G2401 Drug induced subacute dyskinesia: Secondary | ICD-10-CM | POA: Diagnosis not present

## 2018-04-20 DIAGNOSIS — R1310 Dysphagia, unspecified: Secondary | ICD-10-CM | POA: Diagnosis not present

## 2018-04-20 DIAGNOSIS — G20 Parkinson's disease: Secondary | ICD-10-CM | POA: Diagnosis not present

## 2018-04-20 DIAGNOSIS — I951 Orthostatic hypotension: Secondary | ICD-10-CM | POA: Diagnosis not present

## 2018-04-20 DIAGNOSIS — I509 Heart failure, unspecified: Secondary | ICD-10-CM | POA: Diagnosis not present

## 2018-04-22 DIAGNOSIS — I509 Heart failure, unspecified: Secondary | ICD-10-CM | POA: Diagnosis not present

## 2018-04-22 DIAGNOSIS — R1310 Dysphagia, unspecified: Secondary | ICD-10-CM | POA: Diagnosis not present

## 2018-04-22 DIAGNOSIS — G20 Parkinson's disease: Secondary | ICD-10-CM | POA: Diagnosis not present

## 2018-04-22 DIAGNOSIS — F329 Major depressive disorder, single episode, unspecified: Secondary | ICD-10-CM | POA: Diagnosis not present

## 2018-04-22 DIAGNOSIS — F411 Generalized anxiety disorder: Secondary | ICD-10-CM | POA: Diagnosis not present

## 2018-04-22 DIAGNOSIS — I951 Orthostatic hypotension: Secondary | ICD-10-CM | POA: Diagnosis not present

## 2018-04-22 DIAGNOSIS — G2401 Drug induced subacute dyskinesia: Secondary | ICD-10-CM | POA: Diagnosis not present

## 2018-04-22 DIAGNOSIS — F0391 Unspecified dementia with behavioral disturbance: Secondary | ICD-10-CM | POA: Diagnosis not present

## 2018-04-22 DIAGNOSIS — G3183 Dementia with Lewy bodies: Secondary | ICD-10-CM | POA: Diagnosis not present

## 2018-04-23 DIAGNOSIS — E039 Hypothyroidism, unspecified: Secondary | ICD-10-CM | POA: Diagnosis not present

## 2018-04-23 DIAGNOSIS — G3183 Dementia with Lewy bodies: Secondary | ICD-10-CM | POA: Diagnosis not present

## 2018-04-23 DIAGNOSIS — F411 Generalized anxiety disorder: Secondary | ICD-10-CM | POA: Diagnosis not present

## 2018-04-23 DIAGNOSIS — I10 Essential (primary) hypertension: Secondary | ICD-10-CM | POA: Diagnosis not present

## 2018-04-23 DIAGNOSIS — I509 Heart failure, unspecified: Secondary | ICD-10-CM | POA: Diagnosis not present

## 2018-04-23 DIAGNOSIS — G2401 Drug induced subacute dyskinesia: Secondary | ICD-10-CM | POA: Diagnosis not present

## 2018-04-23 DIAGNOSIS — E785 Hyperlipidemia, unspecified: Secondary | ICD-10-CM | POA: Diagnosis not present

## 2018-04-23 DIAGNOSIS — R1310 Dysphagia, unspecified: Secondary | ICD-10-CM | POA: Diagnosis not present

## 2018-04-23 DIAGNOSIS — H409 Unspecified glaucoma: Secondary | ICD-10-CM | POA: Diagnosis not present

## 2018-04-23 DIAGNOSIS — G20 Parkinson's disease: Secondary | ICD-10-CM | POA: Diagnosis not present

## 2018-04-23 DIAGNOSIS — K219 Gastro-esophageal reflux disease without esophagitis: Secondary | ICD-10-CM | POA: Diagnosis not present

## 2018-04-23 DIAGNOSIS — H11149 Conjunctival xerosis, unspecified, unspecified eye: Secondary | ICD-10-CM | POA: Diagnosis not present

## 2018-04-23 DIAGNOSIS — L8990 Pressure ulcer of unspecified site, unspecified stage: Secondary | ICD-10-CM | POA: Diagnosis not present

## 2018-04-23 DIAGNOSIS — I951 Orthostatic hypotension: Secondary | ICD-10-CM | POA: Diagnosis not present

## 2018-04-23 DIAGNOSIS — I679 Cerebrovascular disease, unspecified: Secondary | ICD-10-CM | POA: Diagnosis not present

## 2018-04-27 DIAGNOSIS — G3183 Dementia with Lewy bodies: Secondary | ICD-10-CM | POA: Diagnosis not present

## 2018-04-27 DIAGNOSIS — I951 Orthostatic hypotension: Secondary | ICD-10-CM | POA: Diagnosis not present

## 2018-04-27 DIAGNOSIS — I509 Heart failure, unspecified: Secondary | ICD-10-CM | POA: Diagnosis not present

## 2018-04-27 DIAGNOSIS — R1310 Dysphagia, unspecified: Secondary | ICD-10-CM | POA: Diagnosis not present

## 2018-04-27 DIAGNOSIS — G2401 Drug induced subacute dyskinesia: Secondary | ICD-10-CM | POA: Diagnosis not present

## 2018-04-27 DIAGNOSIS — G20 Parkinson's disease: Secondary | ICD-10-CM | POA: Diagnosis not present

## 2018-04-28 DIAGNOSIS — R1310 Dysphagia, unspecified: Secondary | ICD-10-CM | POA: Diagnosis not present

## 2018-04-28 DIAGNOSIS — I509 Heart failure, unspecified: Secondary | ICD-10-CM | POA: Diagnosis not present

## 2018-04-28 DIAGNOSIS — G3183 Dementia with Lewy bodies: Secondary | ICD-10-CM | POA: Diagnosis not present

## 2018-04-28 DIAGNOSIS — I951 Orthostatic hypotension: Secondary | ICD-10-CM | POA: Diagnosis not present

## 2018-04-28 DIAGNOSIS — G2401 Drug induced subacute dyskinesia: Secondary | ICD-10-CM | POA: Diagnosis not present

## 2018-04-28 DIAGNOSIS — G20 Parkinson's disease: Secondary | ICD-10-CM | POA: Diagnosis not present

## 2018-04-29 DIAGNOSIS — G3183 Dementia with Lewy bodies: Secondary | ICD-10-CM | POA: Diagnosis not present

## 2018-04-29 DIAGNOSIS — R1310 Dysphagia, unspecified: Secondary | ICD-10-CM | POA: Diagnosis not present

## 2018-04-29 DIAGNOSIS — G2401 Drug induced subacute dyskinesia: Secondary | ICD-10-CM | POA: Diagnosis not present

## 2018-04-29 DIAGNOSIS — I951 Orthostatic hypotension: Secondary | ICD-10-CM | POA: Diagnosis not present

## 2018-04-29 DIAGNOSIS — G20 Parkinson's disease: Secondary | ICD-10-CM | POA: Diagnosis not present

## 2018-04-29 DIAGNOSIS — I509 Heart failure, unspecified: Secondary | ICD-10-CM | POA: Diagnosis not present

## 2018-05-01 DIAGNOSIS — I951 Orthostatic hypotension: Secondary | ICD-10-CM | POA: Diagnosis not present

## 2018-05-01 DIAGNOSIS — G3183 Dementia with Lewy bodies: Secondary | ICD-10-CM | POA: Diagnosis not present

## 2018-05-01 DIAGNOSIS — G20 Parkinson's disease: Secondary | ICD-10-CM | POA: Diagnosis not present

## 2018-05-01 DIAGNOSIS — I509 Heart failure, unspecified: Secondary | ICD-10-CM | POA: Diagnosis not present

## 2018-05-01 DIAGNOSIS — R1310 Dysphagia, unspecified: Secondary | ICD-10-CM | POA: Diagnosis not present

## 2018-05-01 DIAGNOSIS — G2401 Drug induced subacute dyskinesia: Secondary | ICD-10-CM | POA: Diagnosis not present

## 2018-05-04 DIAGNOSIS — G2401 Drug induced subacute dyskinesia: Secondary | ICD-10-CM | POA: Diagnosis not present

## 2018-05-04 DIAGNOSIS — I509 Heart failure, unspecified: Secondary | ICD-10-CM | POA: Diagnosis not present

## 2018-05-04 DIAGNOSIS — I951 Orthostatic hypotension: Secondary | ICD-10-CM | POA: Diagnosis not present

## 2018-05-04 DIAGNOSIS — G3183 Dementia with Lewy bodies: Secondary | ICD-10-CM | POA: Diagnosis not present

## 2018-05-04 DIAGNOSIS — G20 Parkinson's disease: Secondary | ICD-10-CM | POA: Diagnosis not present

## 2018-05-04 DIAGNOSIS — R1310 Dysphagia, unspecified: Secondary | ICD-10-CM | POA: Diagnosis not present

## 2018-05-05 DIAGNOSIS — I951 Orthostatic hypotension: Secondary | ICD-10-CM | POA: Diagnosis not present

## 2018-05-05 DIAGNOSIS — G20 Parkinson's disease: Secondary | ICD-10-CM | POA: Diagnosis not present

## 2018-05-05 DIAGNOSIS — G3183 Dementia with Lewy bodies: Secondary | ICD-10-CM | POA: Diagnosis not present

## 2018-05-05 DIAGNOSIS — I509 Heart failure, unspecified: Secondary | ICD-10-CM | POA: Diagnosis not present

## 2018-05-05 DIAGNOSIS — G2401 Drug induced subacute dyskinesia: Secondary | ICD-10-CM | POA: Diagnosis not present

## 2018-05-05 DIAGNOSIS — R1310 Dysphagia, unspecified: Secondary | ICD-10-CM | POA: Diagnosis not present

## 2018-05-06 DIAGNOSIS — I509 Heart failure, unspecified: Secondary | ICD-10-CM | POA: Diagnosis not present

## 2018-05-06 DIAGNOSIS — R1310 Dysphagia, unspecified: Secondary | ICD-10-CM | POA: Diagnosis not present

## 2018-05-06 DIAGNOSIS — G3183 Dementia with Lewy bodies: Secondary | ICD-10-CM | POA: Diagnosis not present

## 2018-05-06 DIAGNOSIS — G20 Parkinson's disease: Secondary | ICD-10-CM | POA: Diagnosis not present

## 2018-05-06 DIAGNOSIS — G2401 Drug induced subacute dyskinesia: Secondary | ICD-10-CM | POA: Diagnosis not present

## 2018-05-06 DIAGNOSIS — I951 Orthostatic hypotension: Secondary | ICD-10-CM | POA: Diagnosis not present

## 2018-05-11 DIAGNOSIS — R1310 Dysphagia, unspecified: Secondary | ICD-10-CM | POA: Diagnosis not present

## 2018-05-11 DIAGNOSIS — G3183 Dementia with Lewy bodies: Secondary | ICD-10-CM | POA: Diagnosis not present

## 2018-05-11 DIAGNOSIS — I509 Heart failure, unspecified: Secondary | ICD-10-CM | POA: Diagnosis not present

## 2018-05-11 DIAGNOSIS — G20 Parkinson's disease: Secondary | ICD-10-CM | POA: Diagnosis not present

## 2018-05-11 DIAGNOSIS — G2401 Drug induced subacute dyskinesia: Secondary | ICD-10-CM | POA: Diagnosis not present

## 2018-05-11 DIAGNOSIS — I951 Orthostatic hypotension: Secondary | ICD-10-CM | POA: Diagnosis not present

## 2018-05-13 DIAGNOSIS — G3183 Dementia with Lewy bodies: Secondary | ICD-10-CM | POA: Diagnosis not present

## 2018-05-13 DIAGNOSIS — I951 Orthostatic hypotension: Secondary | ICD-10-CM | POA: Diagnosis not present

## 2018-05-13 DIAGNOSIS — G2401 Drug induced subacute dyskinesia: Secondary | ICD-10-CM | POA: Diagnosis not present

## 2018-05-13 DIAGNOSIS — I509 Heart failure, unspecified: Secondary | ICD-10-CM | POA: Diagnosis not present

## 2018-05-13 DIAGNOSIS — G20 Parkinson's disease: Secondary | ICD-10-CM | POA: Diagnosis not present

## 2018-05-13 DIAGNOSIS — R1310 Dysphagia, unspecified: Secondary | ICD-10-CM | POA: Diagnosis not present

## 2018-05-18 DIAGNOSIS — G2401 Drug induced subacute dyskinesia: Secondary | ICD-10-CM | POA: Diagnosis not present

## 2018-05-18 DIAGNOSIS — I509 Heart failure, unspecified: Secondary | ICD-10-CM | POA: Diagnosis not present

## 2018-05-18 DIAGNOSIS — G20 Parkinson's disease: Secondary | ICD-10-CM | POA: Diagnosis not present

## 2018-05-18 DIAGNOSIS — I951 Orthostatic hypotension: Secondary | ICD-10-CM | POA: Diagnosis not present

## 2018-05-18 DIAGNOSIS — G3183 Dementia with Lewy bodies: Secondary | ICD-10-CM | POA: Diagnosis not present

## 2018-05-18 DIAGNOSIS — R1310 Dysphagia, unspecified: Secondary | ICD-10-CM | POA: Diagnosis not present

## 2018-05-20 DIAGNOSIS — R1310 Dysphagia, unspecified: Secondary | ICD-10-CM | POA: Diagnosis not present

## 2018-05-20 DIAGNOSIS — E039 Hypothyroidism, unspecified: Secondary | ICD-10-CM | POA: Diagnosis not present

## 2018-05-20 DIAGNOSIS — Z66 Do not resuscitate: Secondary | ICD-10-CM | POA: Diagnosis not present

## 2018-05-20 DIAGNOSIS — F411 Generalized anxiety disorder: Secondary | ICD-10-CM | POA: Diagnosis not present

## 2018-05-20 DIAGNOSIS — I951 Orthostatic hypotension: Secondary | ICD-10-CM | POA: Diagnosis not present

## 2018-05-20 DIAGNOSIS — I509 Heart failure, unspecified: Secondary | ICD-10-CM | POA: Diagnosis not present

## 2018-05-20 DIAGNOSIS — G2401 Drug induced subacute dyskinesia: Secondary | ICD-10-CM | POA: Diagnosis not present

## 2018-05-20 DIAGNOSIS — G20 Parkinson's disease: Secondary | ICD-10-CM | POA: Diagnosis not present

## 2018-05-20 DIAGNOSIS — F29 Unspecified psychosis not due to a substance or known physiological condition: Secondary | ICD-10-CM | POA: Diagnosis not present

## 2018-05-20 DIAGNOSIS — G3183 Dementia with Lewy bodies: Secondary | ICD-10-CM | POA: Diagnosis not present

## 2018-05-22 DIAGNOSIS — G2401 Drug induced subacute dyskinesia: Secondary | ICD-10-CM | POA: Diagnosis not present

## 2018-05-22 DIAGNOSIS — I951 Orthostatic hypotension: Secondary | ICD-10-CM | POA: Diagnosis not present

## 2018-05-22 DIAGNOSIS — G3183 Dementia with Lewy bodies: Secondary | ICD-10-CM | POA: Diagnosis not present

## 2018-05-22 DIAGNOSIS — G20 Parkinson's disease: Secondary | ICD-10-CM | POA: Diagnosis not present

## 2018-05-22 DIAGNOSIS — I509 Heart failure, unspecified: Secondary | ICD-10-CM | POA: Diagnosis not present

## 2018-05-22 DIAGNOSIS — R1310 Dysphagia, unspecified: Secondary | ICD-10-CM | POA: Diagnosis not present

## 2018-05-24 DIAGNOSIS — H409 Unspecified glaucoma: Secondary | ICD-10-CM | POA: Diagnosis not present

## 2018-05-24 DIAGNOSIS — I951 Orthostatic hypotension: Secondary | ICD-10-CM | POA: Diagnosis not present

## 2018-05-24 DIAGNOSIS — L8990 Pressure ulcer of unspecified site, unspecified stage: Secondary | ICD-10-CM | POA: Diagnosis not present

## 2018-05-24 DIAGNOSIS — E039 Hypothyroidism, unspecified: Secondary | ICD-10-CM | POA: Diagnosis not present

## 2018-05-24 DIAGNOSIS — I679 Cerebrovascular disease, unspecified: Secondary | ICD-10-CM | POA: Diagnosis not present

## 2018-05-24 DIAGNOSIS — I509 Heart failure, unspecified: Secondary | ICD-10-CM | POA: Diagnosis not present

## 2018-05-24 DIAGNOSIS — R1310 Dysphagia, unspecified: Secondary | ICD-10-CM | POA: Diagnosis not present

## 2018-05-24 DIAGNOSIS — E785 Hyperlipidemia, unspecified: Secondary | ICD-10-CM | POA: Diagnosis not present

## 2018-05-24 DIAGNOSIS — K219 Gastro-esophageal reflux disease without esophagitis: Secondary | ICD-10-CM | POA: Diagnosis not present

## 2018-05-24 DIAGNOSIS — H11149 Conjunctival xerosis, unspecified, unspecified eye: Secondary | ICD-10-CM | POA: Diagnosis not present

## 2018-05-24 DIAGNOSIS — G2401 Drug induced subacute dyskinesia: Secondary | ICD-10-CM | POA: Diagnosis not present

## 2018-05-24 DIAGNOSIS — F411 Generalized anxiety disorder: Secondary | ICD-10-CM | POA: Diagnosis not present

## 2018-05-24 DIAGNOSIS — G20 Parkinson's disease: Secondary | ICD-10-CM | POA: Diagnosis not present

## 2018-05-24 DIAGNOSIS — I10 Essential (primary) hypertension: Secondary | ICD-10-CM | POA: Diagnosis not present

## 2018-05-24 DIAGNOSIS — G3183 Dementia with Lewy bodies: Secondary | ICD-10-CM | POA: Diagnosis not present

## 2018-05-27 DIAGNOSIS — G3183 Dementia with Lewy bodies: Secondary | ICD-10-CM | POA: Diagnosis not present

## 2018-05-27 DIAGNOSIS — R1310 Dysphagia, unspecified: Secondary | ICD-10-CM | POA: Diagnosis not present

## 2018-05-27 DIAGNOSIS — I509 Heart failure, unspecified: Secondary | ICD-10-CM | POA: Diagnosis not present

## 2018-05-27 DIAGNOSIS — G2401 Drug induced subacute dyskinesia: Secondary | ICD-10-CM | POA: Diagnosis not present

## 2018-05-27 DIAGNOSIS — G20 Parkinson's disease: Secondary | ICD-10-CM | POA: Diagnosis not present

## 2018-05-27 DIAGNOSIS — I951 Orthostatic hypotension: Secondary | ICD-10-CM | POA: Diagnosis not present

## 2018-05-28 DIAGNOSIS — F411 Generalized anxiety disorder: Secondary | ICD-10-CM | POA: Diagnosis not present

## 2018-05-28 DIAGNOSIS — R1310 Dysphagia, unspecified: Secondary | ICD-10-CM | POA: Diagnosis not present

## 2018-05-28 DIAGNOSIS — G20 Parkinson's disease: Secondary | ICD-10-CM | POA: Diagnosis not present

## 2018-05-28 DIAGNOSIS — I509 Heart failure, unspecified: Secondary | ICD-10-CM | POA: Diagnosis not present

## 2018-05-28 DIAGNOSIS — G3183 Dementia with Lewy bodies: Secondary | ICD-10-CM | POA: Diagnosis not present

## 2018-05-28 DIAGNOSIS — I951 Orthostatic hypotension: Secondary | ICD-10-CM | POA: Diagnosis not present

## 2018-05-28 DIAGNOSIS — F0391 Unspecified dementia with behavioral disturbance: Secondary | ICD-10-CM | POA: Diagnosis not present

## 2018-05-28 DIAGNOSIS — G4700 Insomnia, unspecified: Secondary | ICD-10-CM | POA: Diagnosis not present

## 2018-05-28 DIAGNOSIS — G2401 Drug induced subacute dyskinesia: Secondary | ICD-10-CM | POA: Diagnosis not present

## 2018-05-31 DIAGNOSIS — G20 Parkinson's disease: Secondary | ICD-10-CM | POA: Diagnosis not present

## 2018-05-31 DIAGNOSIS — G2401 Drug induced subacute dyskinesia: Secondary | ICD-10-CM | POA: Diagnosis not present

## 2018-05-31 DIAGNOSIS — R1310 Dysphagia, unspecified: Secondary | ICD-10-CM | POA: Diagnosis not present

## 2018-05-31 DIAGNOSIS — I951 Orthostatic hypotension: Secondary | ICD-10-CM | POA: Diagnosis not present

## 2018-05-31 DIAGNOSIS — I509 Heart failure, unspecified: Secondary | ICD-10-CM | POA: Diagnosis not present

## 2018-05-31 DIAGNOSIS — G3183 Dementia with Lewy bodies: Secondary | ICD-10-CM | POA: Diagnosis not present

## 2018-06-01 DIAGNOSIS — I509 Heart failure, unspecified: Secondary | ICD-10-CM | POA: Diagnosis not present

## 2018-06-01 DIAGNOSIS — I951 Orthostatic hypotension: Secondary | ICD-10-CM | POA: Diagnosis not present

## 2018-06-01 DIAGNOSIS — G20 Parkinson's disease: Secondary | ICD-10-CM | POA: Diagnosis not present

## 2018-06-01 DIAGNOSIS — G2401 Drug induced subacute dyskinesia: Secondary | ICD-10-CM | POA: Diagnosis not present

## 2018-06-01 DIAGNOSIS — R1310 Dysphagia, unspecified: Secondary | ICD-10-CM | POA: Diagnosis not present

## 2018-06-01 DIAGNOSIS — G3183 Dementia with Lewy bodies: Secondary | ICD-10-CM | POA: Diagnosis not present

## 2018-06-02 DIAGNOSIS — G2401 Drug induced subacute dyskinesia: Secondary | ICD-10-CM | POA: Diagnosis not present

## 2018-06-02 DIAGNOSIS — G20 Parkinson's disease: Secondary | ICD-10-CM | POA: Diagnosis not present

## 2018-06-02 DIAGNOSIS — G3183 Dementia with Lewy bodies: Secondary | ICD-10-CM | POA: Diagnosis not present

## 2018-06-02 DIAGNOSIS — R1310 Dysphagia, unspecified: Secondary | ICD-10-CM | POA: Diagnosis not present

## 2018-06-02 DIAGNOSIS — I509 Heart failure, unspecified: Secondary | ICD-10-CM | POA: Diagnosis not present

## 2018-06-02 DIAGNOSIS — I951 Orthostatic hypotension: Secondary | ICD-10-CM | POA: Diagnosis not present

## 2018-06-03 DIAGNOSIS — G20 Parkinson's disease: Secondary | ICD-10-CM | POA: Diagnosis not present

## 2018-06-03 DIAGNOSIS — G3183 Dementia with Lewy bodies: Secondary | ICD-10-CM | POA: Diagnosis not present

## 2018-06-03 DIAGNOSIS — G2401 Drug induced subacute dyskinesia: Secondary | ICD-10-CM | POA: Diagnosis not present

## 2018-06-03 DIAGNOSIS — I951 Orthostatic hypotension: Secondary | ICD-10-CM | POA: Diagnosis not present

## 2018-06-03 DIAGNOSIS — I509 Heart failure, unspecified: Secondary | ICD-10-CM | POA: Diagnosis not present

## 2018-06-03 DIAGNOSIS — R1310 Dysphagia, unspecified: Secondary | ICD-10-CM | POA: Diagnosis not present

## 2018-06-05 DIAGNOSIS — G3183 Dementia with Lewy bodies: Secondary | ICD-10-CM | POA: Diagnosis not present

## 2018-06-05 DIAGNOSIS — I509 Heart failure, unspecified: Secondary | ICD-10-CM | POA: Diagnosis not present

## 2018-06-05 DIAGNOSIS — I951 Orthostatic hypotension: Secondary | ICD-10-CM | POA: Diagnosis not present

## 2018-06-05 DIAGNOSIS — G2401 Drug induced subacute dyskinesia: Secondary | ICD-10-CM | POA: Diagnosis not present

## 2018-06-05 DIAGNOSIS — R1310 Dysphagia, unspecified: Secondary | ICD-10-CM | POA: Diagnosis not present

## 2018-06-05 DIAGNOSIS — G20 Parkinson's disease: Secondary | ICD-10-CM | POA: Diagnosis not present

## 2018-06-08 DIAGNOSIS — G2401 Drug induced subacute dyskinesia: Secondary | ICD-10-CM | POA: Diagnosis not present

## 2018-06-08 DIAGNOSIS — R1310 Dysphagia, unspecified: Secondary | ICD-10-CM | POA: Diagnosis not present

## 2018-06-08 DIAGNOSIS — G20 Parkinson's disease: Secondary | ICD-10-CM | POA: Diagnosis not present

## 2018-06-08 DIAGNOSIS — G3183 Dementia with Lewy bodies: Secondary | ICD-10-CM | POA: Diagnosis not present

## 2018-06-08 DIAGNOSIS — I509 Heart failure, unspecified: Secondary | ICD-10-CM | POA: Diagnosis not present

## 2018-06-08 DIAGNOSIS — I951 Orthostatic hypotension: Secondary | ICD-10-CM | POA: Diagnosis not present

## 2018-06-09 DIAGNOSIS — I509 Heart failure, unspecified: Secondary | ICD-10-CM | POA: Diagnosis not present

## 2018-06-09 DIAGNOSIS — R1310 Dysphagia, unspecified: Secondary | ICD-10-CM | POA: Diagnosis not present

## 2018-06-09 DIAGNOSIS — G20 Parkinson's disease: Secondary | ICD-10-CM | POA: Diagnosis not present

## 2018-06-09 DIAGNOSIS — G3183 Dementia with Lewy bodies: Secondary | ICD-10-CM | POA: Diagnosis not present

## 2018-06-09 DIAGNOSIS — I951 Orthostatic hypotension: Secondary | ICD-10-CM | POA: Diagnosis not present

## 2018-06-09 DIAGNOSIS — G2401 Drug induced subacute dyskinesia: Secondary | ICD-10-CM | POA: Diagnosis not present

## 2018-06-10 DIAGNOSIS — I951 Orthostatic hypotension: Secondary | ICD-10-CM | POA: Diagnosis not present

## 2018-06-10 DIAGNOSIS — G20 Parkinson's disease: Secondary | ICD-10-CM | POA: Diagnosis not present

## 2018-06-10 DIAGNOSIS — R1310 Dysphagia, unspecified: Secondary | ICD-10-CM | POA: Diagnosis not present

## 2018-06-10 DIAGNOSIS — G3183 Dementia with Lewy bodies: Secondary | ICD-10-CM | POA: Diagnosis not present

## 2018-06-10 DIAGNOSIS — G2401 Drug induced subacute dyskinesia: Secondary | ICD-10-CM | POA: Diagnosis not present

## 2018-06-10 DIAGNOSIS — I509 Heart failure, unspecified: Secondary | ICD-10-CM | POA: Diagnosis not present

## 2018-06-15 DIAGNOSIS — I951 Orthostatic hypotension: Secondary | ICD-10-CM | POA: Diagnosis not present

## 2018-06-15 DIAGNOSIS — G2401 Drug induced subacute dyskinesia: Secondary | ICD-10-CM | POA: Diagnosis not present

## 2018-06-15 DIAGNOSIS — G3183 Dementia with Lewy bodies: Secondary | ICD-10-CM | POA: Diagnosis not present

## 2018-06-15 DIAGNOSIS — I509 Heart failure, unspecified: Secondary | ICD-10-CM | POA: Diagnosis not present

## 2018-06-15 DIAGNOSIS — R1310 Dysphagia, unspecified: Secondary | ICD-10-CM | POA: Diagnosis not present

## 2018-06-15 DIAGNOSIS — G20 Parkinson's disease: Secondary | ICD-10-CM | POA: Diagnosis not present

## 2018-06-16 DIAGNOSIS — G2401 Drug induced subacute dyskinesia: Secondary | ICD-10-CM | POA: Diagnosis not present

## 2018-06-16 DIAGNOSIS — I951 Orthostatic hypotension: Secondary | ICD-10-CM | POA: Diagnosis not present

## 2018-06-16 DIAGNOSIS — G3183 Dementia with Lewy bodies: Secondary | ICD-10-CM | POA: Diagnosis not present

## 2018-06-16 DIAGNOSIS — G20 Parkinson's disease: Secondary | ICD-10-CM | POA: Diagnosis not present

## 2018-06-16 DIAGNOSIS — R1310 Dysphagia, unspecified: Secondary | ICD-10-CM | POA: Diagnosis not present

## 2018-06-16 DIAGNOSIS — I509 Heart failure, unspecified: Secondary | ICD-10-CM | POA: Diagnosis not present

## 2018-06-17 DIAGNOSIS — G20 Parkinson's disease: Secondary | ICD-10-CM | POA: Diagnosis not present

## 2018-06-17 DIAGNOSIS — I509 Heart failure, unspecified: Secondary | ICD-10-CM | POA: Diagnosis not present

## 2018-06-17 DIAGNOSIS — I951 Orthostatic hypotension: Secondary | ICD-10-CM | POA: Diagnosis not present

## 2018-06-17 DIAGNOSIS — G3183 Dementia with Lewy bodies: Secondary | ICD-10-CM | POA: Diagnosis not present

## 2018-06-17 DIAGNOSIS — G2401 Drug induced subacute dyskinesia: Secondary | ICD-10-CM | POA: Diagnosis not present

## 2018-06-17 DIAGNOSIS — R1310 Dysphagia, unspecified: Secondary | ICD-10-CM | POA: Diagnosis not present

## 2018-06-22 DIAGNOSIS — G20 Parkinson's disease: Secondary | ICD-10-CM | POA: Diagnosis not present

## 2018-06-22 DIAGNOSIS — G3183 Dementia with Lewy bodies: Secondary | ICD-10-CM | POA: Diagnosis not present

## 2018-06-22 DIAGNOSIS — I509 Heart failure, unspecified: Secondary | ICD-10-CM | POA: Diagnosis not present

## 2018-06-22 DIAGNOSIS — G2401 Drug induced subacute dyskinesia: Secondary | ICD-10-CM | POA: Diagnosis not present

## 2018-06-22 DIAGNOSIS — R1310 Dysphagia, unspecified: Secondary | ICD-10-CM | POA: Diagnosis not present

## 2018-06-22 DIAGNOSIS — I951 Orthostatic hypotension: Secondary | ICD-10-CM | POA: Diagnosis not present

## 2018-06-23 DIAGNOSIS — E785 Hyperlipidemia, unspecified: Secondary | ICD-10-CM | POA: Diagnosis not present

## 2018-06-23 DIAGNOSIS — F411 Generalized anxiety disorder: Secondary | ICD-10-CM | POA: Diagnosis not present

## 2018-06-23 DIAGNOSIS — G3183 Dementia with Lewy bodies: Secondary | ICD-10-CM | POA: Diagnosis not present

## 2018-06-23 DIAGNOSIS — K219 Gastro-esophageal reflux disease without esophagitis: Secondary | ICD-10-CM | POA: Diagnosis not present

## 2018-06-23 DIAGNOSIS — L8990 Pressure ulcer of unspecified site, unspecified stage: Secondary | ICD-10-CM | POA: Diagnosis not present

## 2018-06-23 DIAGNOSIS — H409 Unspecified glaucoma: Secondary | ICD-10-CM | POA: Diagnosis not present

## 2018-06-23 DIAGNOSIS — E039 Hypothyroidism, unspecified: Secondary | ICD-10-CM | POA: Diagnosis not present

## 2018-06-23 DIAGNOSIS — R1310 Dysphagia, unspecified: Secondary | ICD-10-CM | POA: Diagnosis not present

## 2018-06-23 DIAGNOSIS — H11149 Conjunctival xerosis, unspecified, unspecified eye: Secondary | ICD-10-CM | POA: Diagnosis not present

## 2018-06-23 DIAGNOSIS — I10 Essential (primary) hypertension: Secondary | ICD-10-CM | POA: Diagnosis not present

## 2018-06-23 DIAGNOSIS — I679 Cerebrovascular disease, unspecified: Secondary | ICD-10-CM | POA: Diagnosis not present

## 2018-06-23 DIAGNOSIS — G2401 Drug induced subacute dyskinesia: Secondary | ICD-10-CM | POA: Diagnosis not present

## 2018-06-23 DIAGNOSIS — G20 Parkinson's disease: Secondary | ICD-10-CM | POA: Diagnosis not present

## 2018-06-23 DIAGNOSIS — I509 Heart failure, unspecified: Secondary | ICD-10-CM | POA: Diagnosis not present

## 2018-06-23 DIAGNOSIS — I951 Orthostatic hypotension: Secondary | ICD-10-CM | POA: Diagnosis not present

## 2018-06-24 DIAGNOSIS — G2401 Drug induced subacute dyskinesia: Secondary | ICD-10-CM | POA: Diagnosis not present

## 2018-06-24 DIAGNOSIS — G20 Parkinson's disease: Secondary | ICD-10-CM | POA: Diagnosis not present

## 2018-06-24 DIAGNOSIS — R1310 Dysphagia, unspecified: Secondary | ICD-10-CM | POA: Diagnosis not present

## 2018-06-24 DIAGNOSIS — G3183 Dementia with Lewy bodies: Secondary | ICD-10-CM | POA: Diagnosis not present

## 2018-06-24 DIAGNOSIS — I951 Orthostatic hypotension: Secondary | ICD-10-CM | POA: Diagnosis not present

## 2018-06-24 DIAGNOSIS — I509 Heart failure, unspecified: Secondary | ICD-10-CM | POA: Diagnosis not present

## 2018-06-25 DIAGNOSIS — F411 Generalized anxiety disorder: Secondary | ICD-10-CM | POA: Diagnosis not present

## 2018-06-25 DIAGNOSIS — G4700 Insomnia, unspecified: Secondary | ICD-10-CM | POA: Diagnosis not present

## 2018-06-25 DIAGNOSIS — G3183 Dementia with Lewy bodies: Secondary | ICD-10-CM | POA: Diagnosis not present

## 2018-06-29 DIAGNOSIS — G3183 Dementia with Lewy bodies: Secondary | ICD-10-CM | POA: Diagnosis not present

## 2018-06-29 DIAGNOSIS — G20 Parkinson's disease: Secondary | ICD-10-CM | POA: Diagnosis not present

## 2018-06-29 DIAGNOSIS — G2401 Drug induced subacute dyskinesia: Secondary | ICD-10-CM | POA: Diagnosis not present

## 2018-06-29 DIAGNOSIS — R1310 Dysphagia, unspecified: Secondary | ICD-10-CM | POA: Diagnosis not present

## 2018-06-29 DIAGNOSIS — I509 Heart failure, unspecified: Secondary | ICD-10-CM | POA: Diagnosis not present

## 2018-06-29 DIAGNOSIS — I951 Orthostatic hypotension: Secondary | ICD-10-CM | POA: Diagnosis not present

## 2018-06-30 DIAGNOSIS — I509 Heart failure, unspecified: Secondary | ICD-10-CM | POA: Diagnosis not present

## 2018-06-30 DIAGNOSIS — R1310 Dysphagia, unspecified: Secondary | ICD-10-CM | POA: Diagnosis not present

## 2018-06-30 DIAGNOSIS — G2401 Drug induced subacute dyskinesia: Secondary | ICD-10-CM | POA: Diagnosis not present

## 2018-06-30 DIAGNOSIS — G20 Parkinson's disease: Secondary | ICD-10-CM | POA: Diagnosis not present

## 2018-06-30 DIAGNOSIS — G3183 Dementia with Lewy bodies: Secondary | ICD-10-CM | POA: Diagnosis not present

## 2018-06-30 DIAGNOSIS — I951 Orthostatic hypotension: Secondary | ICD-10-CM | POA: Diagnosis not present

## 2018-07-01 DIAGNOSIS — F411 Generalized anxiety disorder: Secondary | ICD-10-CM | POA: Diagnosis not present

## 2018-07-01 DIAGNOSIS — Z23 Encounter for immunization: Secondary | ICD-10-CM | POA: Diagnosis not present

## 2018-07-01 DIAGNOSIS — I951 Orthostatic hypotension: Secondary | ICD-10-CM | POA: Diagnosis not present

## 2018-07-01 DIAGNOSIS — G4700 Insomnia, unspecified: Secondary | ICD-10-CM | POA: Diagnosis not present

## 2018-07-01 DIAGNOSIS — R1310 Dysphagia, unspecified: Secondary | ICD-10-CM | POA: Diagnosis not present

## 2018-07-01 DIAGNOSIS — G20 Parkinson's disease: Secondary | ICD-10-CM | POA: Diagnosis not present

## 2018-07-01 DIAGNOSIS — G2401 Drug induced subacute dyskinesia: Secondary | ICD-10-CM | POA: Diagnosis not present

## 2018-07-01 DIAGNOSIS — G3183 Dementia with Lewy bodies: Secondary | ICD-10-CM | POA: Diagnosis not present

## 2018-07-01 DIAGNOSIS — I509 Heart failure, unspecified: Secondary | ICD-10-CM | POA: Diagnosis not present

## 2018-07-01 DIAGNOSIS — F339 Major depressive disorder, recurrent, unspecified: Secondary | ICD-10-CM | POA: Diagnosis not present

## 2018-07-06 DIAGNOSIS — I509 Heart failure, unspecified: Secondary | ICD-10-CM | POA: Diagnosis not present

## 2018-07-06 DIAGNOSIS — G20 Parkinson's disease: Secondary | ICD-10-CM | POA: Diagnosis not present

## 2018-07-06 DIAGNOSIS — G3183 Dementia with Lewy bodies: Secondary | ICD-10-CM | POA: Diagnosis not present

## 2018-07-06 DIAGNOSIS — G2401 Drug induced subacute dyskinesia: Secondary | ICD-10-CM | POA: Diagnosis not present

## 2018-07-06 DIAGNOSIS — I951 Orthostatic hypotension: Secondary | ICD-10-CM | POA: Diagnosis not present

## 2018-07-06 DIAGNOSIS — R1310 Dysphagia, unspecified: Secondary | ICD-10-CM | POA: Diagnosis not present

## 2018-07-08 DIAGNOSIS — I951 Orthostatic hypotension: Secondary | ICD-10-CM | POA: Diagnosis not present

## 2018-07-08 DIAGNOSIS — G2401 Drug induced subacute dyskinesia: Secondary | ICD-10-CM | POA: Diagnosis not present

## 2018-07-08 DIAGNOSIS — G20 Parkinson's disease: Secondary | ICD-10-CM | POA: Diagnosis not present

## 2018-07-08 DIAGNOSIS — I509 Heart failure, unspecified: Secondary | ICD-10-CM | POA: Diagnosis not present

## 2018-07-08 DIAGNOSIS — R1310 Dysphagia, unspecified: Secondary | ICD-10-CM | POA: Diagnosis not present

## 2018-07-08 DIAGNOSIS — G3183 Dementia with Lewy bodies: Secondary | ICD-10-CM | POA: Diagnosis not present

## 2018-07-09 DIAGNOSIS — G3183 Dementia with Lewy bodies: Secondary | ICD-10-CM | POA: Diagnosis not present

## 2018-07-09 DIAGNOSIS — G20 Parkinson's disease: Secondary | ICD-10-CM | POA: Diagnosis not present

## 2018-07-09 DIAGNOSIS — I951 Orthostatic hypotension: Secondary | ICD-10-CM | POA: Diagnosis not present

## 2018-07-09 DIAGNOSIS — I509 Heart failure, unspecified: Secondary | ICD-10-CM | POA: Diagnosis not present

## 2018-07-09 DIAGNOSIS — G2401 Drug induced subacute dyskinesia: Secondary | ICD-10-CM | POA: Diagnosis not present

## 2018-07-09 DIAGNOSIS — R1310 Dysphagia, unspecified: Secondary | ICD-10-CM | POA: Diagnosis not present

## 2018-07-13 DIAGNOSIS — G20 Parkinson's disease: Secondary | ICD-10-CM | POA: Diagnosis not present

## 2018-07-13 DIAGNOSIS — G2401 Drug induced subacute dyskinesia: Secondary | ICD-10-CM | POA: Diagnosis not present

## 2018-07-13 DIAGNOSIS — I951 Orthostatic hypotension: Secondary | ICD-10-CM | POA: Diagnosis not present

## 2018-07-13 DIAGNOSIS — R1310 Dysphagia, unspecified: Secondary | ICD-10-CM | POA: Diagnosis not present

## 2018-07-13 DIAGNOSIS — G3183 Dementia with Lewy bodies: Secondary | ICD-10-CM | POA: Diagnosis not present

## 2018-07-13 DIAGNOSIS — I509 Heart failure, unspecified: Secondary | ICD-10-CM | POA: Diagnosis not present

## 2018-07-15 DIAGNOSIS — M545 Low back pain: Secondary | ICD-10-CM | POA: Diagnosis not present

## 2018-07-15 DIAGNOSIS — R1310 Dysphagia, unspecified: Secondary | ICD-10-CM | POA: Diagnosis not present

## 2018-07-15 DIAGNOSIS — R51 Headache: Secondary | ICD-10-CM | POA: Diagnosis not present

## 2018-07-15 DIAGNOSIS — Z66 Do not resuscitate: Secondary | ICD-10-CM | POA: Diagnosis not present

## 2018-07-15 DIAGNOSIS — F411 Generalized anxiety disorder: Secondary | ICD-10-CM | POA: Diagnosis not present

## 2018-07-15 DIAGNOSIS — W19XXXA Unspecified fall, initial encounter: Secondary | ICD-10-CM | POA: Diagnosis not present

## 2018-07-15 DIAGNOSIS — G2401 Drug induced subacute dyskinesia: Secondary | ICD-10-CM | POA: Diagnosis not present

## 2018-07-15 DIAGNOSIS — D649 Anemia, unspecified: Secondary | ICD-10-CM | POA: Diagnosis not present

## 2018-07-15 DIAGNOSIS — F0391 Unspecified dementia with behavioral disturbance: Secondary | ICD-10-CM | POA: Diagnosis not present

## 2018-07-15 DIAGNOSIS — F339 Major depressive disorder, recurrent, unspecified: Secondary | ICD-10-CM | POA: Diagnosis not present

## 2018-07-15 DIAGNOSIS — G3183 Dementia with Lewy bodies: Secondary | ICD-10-CM | POA: Diagnosis not present

## 2018-07-15 DIAGNOSIS — I10 Essential (primary) hypertension: Secondary | ICD-10-CM | POA: Diagnosis not present

## 2018-07-15 DIAGNOSIS — G20 Parkinson's disease: Secondary | ICD-10-CM | POA: Diagnosis not present

## 2018-07-15 DIAGNOSIS — I509 Heart failure, unspecified: Secondary | ICD-10-CM | POA: Diagnosis not present

## 2018-07-15 DIAGNOSIS — I951 Orthostatic hypotension: Secondary | ICD-10-CM | POA: Diagnosis not present

## 2018-07-17 DIAGNOSIS — G3183 Dementia with Lewy bodies: Secondary | ICD-10-CM | POA: Diagnosis not present

## 2018-07-17 DIAGNOSIS — R1310 Dysphagia, unspecified: Secondary | ICD-10-CM | POA: Diagnosis not present

## 2018-07-17 DIAGNOSIS — G20 Parkinson's disease: Secondary | ICD-10-CM | POA: Diagnosis not present

## 2018-07-17 DIAGNOSIS — G2401 Drug induced subacute dyskinesia: Secondary | ICD-10-CM | POA: Diagnosis not present

## 2018-07-17 DIAGNOSIS — I509 Heart failure, unspecified: Secondary | ICD-10-CM | POA: Diagnosis not present

## 2018-07-17 DIAGNOSIS — I951 Orthostatic hypotension: Secondary | ICD-10-CM | POA: Diagnosis not present

## 2018-07-20 DIAGNOSIS — R1310 Dysphagia, unspecified: Secondary | ICD-10-CM | POA: Diagnosis not present

## 2018-07-20 DIAGNOSIS — I951 Orthostatic hypotension: Secondary | ICD-10-CM | POA: Diagnosis not present

## 2018-07-20 DIAGNOSIS — I509 Heart failure, unspecified: Secondary | ICD-10-CM | POA: Diagnosis not present

## 2018-07-20 DIAGNOSIS — G2401 Drug induced subacute dyskinesia: Secondary | ICD-10-CM | POA: Diagnosis not present

## 2018-07-20 DIAGNOSIS — G3183 Dementia with Lewy bodies: Secondary | ICD-10-CM | POA: Diagnosis not present

## 2018-07-20 DIAGNOSIS — G20 Parkinson's disease: Secondary | ICD-10-CM | POA: Diagnosis not present

## 2018-07-22 DIAGNOSIS — R1310 Dysphagia, unspecified: Secondary | ICD-10-CM | POA: Diagnosis not present

## 2018-07-22 DIAGNOSIS — I509 Heart failure, unspecified: Secondary | ICD-10-CM | POA: Diagnosis not present

## 2018-07-22 DIAGNOSIS — I951 Orthostatic hypotension: Secondary | ICD-10-CM | POA: Diagnosis not present

## 2018-07-22 DIAGNOSIS — G3183 Dementia with Lewy bodies: Secondary | ICD-10-CM | POA: Diagnosis not present

## 2018-07-22 DIAGNOSIS — G20 Parkinson's disease: Secondary | ICD-10-CM | POA: Diagnosis not present

## 2018-07-22 DIAGNOSIS — G2401 Drug induced subacute dyskinesia: Secondary | ICD-10-CM | POA: Diagnosis not present

## 2018-07-23 DIAGNOSIS — G20 Parkinson's disease: Secondary | ICD-10-CM | POA: Diagnosis not present

## 2018-07-23 DIAGNOSIS — I951 Orthostatic hypotension: Secondary | ICD-10-CM | POA: Diagnosis not present

## 2018-07-23 DIAGNOSIS — R1310 Dysphagia, unspecified: Secondary | ICD-10-CM | POA: Diagnosis not present

## 2018-07-23 DIAGNOSIS — I509 Heart failure, unspecified: Secondary | ICD-10-CM | POA: Diagnosis not present

## 2018-07-23 DIAGNOSIS — G2401 Drug induced subacute dyskinesia: Secondary | ICD-10-CM | POA: Diagnosis not present

## 2018-07-23 DIAGNOSIS — G3183 Dementia with Lewy bodies: Secondary | ICD-10-CM | POA: Diagnosis not present

## 2018-07-24 DIAGNOSIS — I679 Cerebrovascular disease, unspecified: Secondary | ICD-10-CM | POA: Diagnosis not present

## 2018-07-24 DIAGNOSIS — H409 Unspecified glaucoma: Secondary | ICD-10-CM | POA: Diagnosis not present

## 2018-07-24 DIAGNOSIS — E785 Hyperlipidemia, unspecified: Secondary | ICD-10-CM | POA: Diagnosis not present

## 2018-07-24 DIAGNOSIS — L8990 Pressure ulcer of unspecified site, unspecified stage: Secondary | ICD-10-CM | POA: Diagnosis not present

## 2018-07-24 DIAGNOSIS — G3183 Dementia with Lewy bodies: Secondary | ICD-10-CM | POA: Diagnosis not present

## 2018-07-24 DIAGNOSIS — G2401 Drug induced subacute dyskinesia: Secondary | ICD-10-CM | POA: Diagnosis not present

## 2018-07-24 DIAGNOSIS — R1310 Dysphagia, unspecified: Secondary | ICD-10-CM | POA: Diagnosis not present

## 2018-07-24 DIAGNOSIS — G20 Parkinson's disease: Secondary | ICD-10-CM | POA: Diagnosis not present

## 2018-07-24 DIAGNOSIS — I509 Heart failure, unspecified: Secondary | ICD-10-CM | POA: Diagnosis not present

## 2018-07-24 DIAGNOSIS — F411 Generalized anxiety disorder: Secondary | ICD-10-CM | POA: Diagnosis not present

## 2018-07-24 DIAGNOSIS — I951 Orthostatic hypotension: Secondary | ICD-10-CM | POA: Diagnosis not present

## 2018-07-24 DIAGNOSIS — I10 Essential (primary) hypertension: Secondary | ICD-10-CM | POA: Diagnosis not present

## 2018-07-24 DIAGNOSIS — H11149 Conjunctival xerosis, unspecified, unspecified eye: Secondary | ICD-10-CM | POA: Diagnosis not present

## 2018-07-24 DIAGNOSIS — K219 Gastro-esophageal reflux disease without esophagitis: Secondary | ICD-10-CM | POA: Diagnosis not present

## 2018-07-24 DIAGNOSIS — E039 Hypothyroidism, unspecified: Secondary | ICD-10-CM | POA: Diagnosis not present

## 2018-07-27 DIAGNOSIS — I509 Heart failure, unspecified: Secondary | ICD-10-CM | POA: Diagnosis not present

## 2018-07-27 DIAGNOSIS — R1310 Dysphagia, unspecified: Secondary | ICD-10-CM | POA: Diagnosis not present

## 2018-07-27 DIAGNOSIS — G2401 Drug induced subacute dyskinesia: Secondary | ICD-10-CM | POA: Diagnosis not present

## 2018-07-27 DIAGNOSIS — G3183 Dementia with Lewy bodies: Secondary | ICD-10-CM | POA: Diagnosis not present

## 2018-07-27 DIAGNOSIS — G20 Parkinson's disease: Secondary | ICD-10-CM | POA: Diagnosis not present

## 2018-07-27 DIAGNOSIS — I951 Orthostatic hypotension: Secondary | ICD-10-CM | POA: Diagnosis not present

## 2018-07-28 DIAGNOSIS — R1310 Dysphagia, unspecified: Secondary | ICD-10-CM | POA: Diagnosis not present

## 2018-07-28 DIAGNOSIS — I509 Heart failure, unspecified: Secondary | ICD-10-CM | POA: Diagnosis not present

## 2018-07-28 DIAGNOSIS — G20 Parkinson's disease: Secondary | ICD-10-CM | POA: Diagnosis not present

## 2018-07-28 DIAGNOSIS — I951 Orthostatic hypotension: Secondary | ICD-10-CM | POA: Diagnosis not present

## 2018-07-28 DIAGNOSIS — G3183 Dementia with Lewy bodies: Secondary | ICD-10-CM | POA: Diagnosis not present

## 2018-07-28 DIAGNOSIS — G2401 Drug induced subacute dyskinesia: Secondary | ICD-10-CM | POA: Diagnosis not present

## 2018-07-29 DIAGNOSIS — G3183 Dementia with Lewy bodies: Secondary | ICD-10-CM | POA: Diagnosis not present

## 2018-07-29 DIAGNOSIS — I951 Orthostatic hypotension: Secondary | ICD-10-CM | POA: Diagnosis not present

## 2018-07-29 DIAGNOSIS — R1310 Dysphagia, unspecified: Secondary | ICD-10-CM | POA: Diagnosis not present

## 2018-07-29 DIAGNOSIS — G2401 Drug induced subacute dyskinesia: Secondary | ICD-10-CM | POA: Diagnosis not present

## 2018-07-29 DIAGNOSIS — I509 Heart failure, unspecified: Secondary | ICD-10-CM | POA: Diagnosis not present

## 2018-07-29 DIAGNOSIS — G20 Parkinson's disease: Secondary | ICD-10-CM | POA: Diagnosis not present

## 2018-08-03 DIAGNOSIS — R1310 Dysphagia, unspecified: Secondary | ICD-10-CM | POA: Diagnosis not present

## 2018-08-03 DIAGNOSIS — G3183 Dementia with Lewy bodies: Secondary | ICD-10-CM | POA: Diagnosis not present

## 2018-08-03 DIAGNOSIS — I951 Orthostatic hypotension: Secondary | ICD-10-CM | POA: Diagnosis not present

## 2018-08-03 DIAGNOSIS — G2401 Drug induced subacute dyskinesia: Secondary | ICD-10-CM | POA: Diagnosis not present

## 2018-08-03 DIAGNOSIS — I509 Heart failure, unspecified: Secondary | ICD-10-CM | POA: Diagnosis not present

## 2018-08-03 DIAGNOSIS — G20 Parkinson's disease: Secondary | ICD-10-CM | POA: Diagnosis not present

## 2018-08-05 DIAGNOSIS — G2401 Drug induced subacute dyskinesia: Secondary | ICD-10-CM | POA: Diagnosis not present

## 2018-08-05 DIAGNOSIS — G20 Parkinson's disease: Secondary | ICD-10-CM | POA: Diagnosis not present

## 2018-08-05 DIAGNOSIS — G3183 Dementia with Lewy bodies: Secondary | ICD-10-CM | POA: Diagnosis not present

## 2018-08-05 DIAGNOSIS — I509 Heart failure, unspecified: Secondary | ICD-10-CM | POA: Diagnosis not present

## 2018-08-05 DIAGNOSIS — I951 Orthostatic hypotension: Secondary | ICD-10-CM | POA: Diagnosis not present

## 2018-08-05 DIAGNOSIS — R1310 Dysphagia, unspecified: Secondary | ICD-10-CM | POA: Diagnosis not present

## 2018-08-06 DIAGNOSIS — G20 Parkinson's disease: Secondary | ICD-10-CM | POA: Diagnosis not present

## 2018-08-06 DIAGNOSIS — R1310 Dysphagia, unspecified: Secondary | ICD-10-CM | POA: Diagnosis not present

## 2018-08-06 DIAGNOSIS — G2401 Drug induced subacute dyskinesia: Secondary | ICD-10-CM | POA: Diagnosis not present

## 2018-08-06 DIAGNOSIS — G3183 Dementia with Lewy bodies: Secondary | ICD-10-CM | POA: Diagnosis not present

## 2018-08-06 DIAGNOSIS — I951 Orthostatic hypotension: Secondary | ICD-10-CM | POA: Diagnosis not present

## 2018-08-06 DIAGNOSIS — I509 Heart failure, unspecified: Secondary | ICD-10-CM | POA: Diagnosis not present

## 2018-08-10 DIAGNOSIS — L89154 Pressure ulcer of sacral region, stage 4: Secondary | ICD-10-CM | POA: Diagnosis not present

## 2018-08-10 DIAGNOSIS — I509 Heart failure, unspecified: Secondary | ICD-10-CM | POA: Diagnosis not present

## 2018-08-10 DIAGNOSIS — G3183 Dementia with Lewy bodies: Secondary | ICD-10-CM | POA: Diagnosis not present

## 2018-08-10 DIAGNOSIS — G2401 Drug induced subacute dyskinesia: Secondary | ICD-10-CM | POA: Diagnosis not present

## 2018-08-10 DIAGNOSIS — M6281 Muscle weakness (generalized): Secondary | ICD-10-CM | POA: Diagnosis not present

## 2018-08-10 DIAGNOSIS — G20 Parkinson's disease: Secondary | ICD-10-CM | POA: Diagnosis not present

## 2018-08-10 DIAGNOSIS — I951 Orthostatic hypotension: Secondary | ICD-10-CM | POA: Diagnosis not present

## 2018-08-10 DIAGNOSIS — I10 Essential (primary) hypertension: Secondary | ICD-10-CM | POA: Diagnosis not present

## 2018-08-10 DIAGNOSIS — F0390 Unspecified dementia without behavioral disturbance: Secondary | ICD-10-CM | POA: Diagnosis not present

## 2018-08-10 DIAGNOSIS — R1310 Dysphagia, unspecified: Secondary | ICD-10-CM | POA: Diagnosis not present

## 2018-08-11 DIAGNOSIS — R1310 Dysphagia, unspecified: Secondary | ICD-10-CM | POA: Diagnosis not present

## 2018-08-11 DIAGNOSIS — I951 Orthostatic hypotension: Secondary | ICD-10-CM | POA: Diagnosis not present

## 2018-08-11 DIAGNOSIS — G3183 Dementia with Lewy bodies: Secondary | ICD-10-CM | POA: Diagnosis not present

## 2018-08-11 DIAGNOSIS — G2401 Drug induced subacute dyskinesia: Secondary | ICD-10-CM | POA: Diagnosis not present

## 2018-08-11 DIAGNOSIS — G20 Parkinson's disease: Secondary | ICD-10-CM | POA: Diagnosis not present

## 2018-08-11 DIAGNOSIS — I509 Heart failure, unspecified: Secondary | ICD-10-CM | POA: Diagnosis not present

## 2018-08-12 DIAGNOSIS — R1310 Dysphagia, unspecified: Secondary | ICD-10-CM | POA: Diagnosis not present

## 2018-08-12 DIAGNOSIS — G20 Parkinson's disease: Secondary | ICD-10-CM | POA: Diagnosis not present

## 2018-08-12 DIAGNOSIS — G2401 Drug induced subacute dyskinesia: Secondary | ICD-10-CM | POA: Diagnosis not present

## 2018-08-12 DIAGNOSIS — G3183 Dementia with Lewy bodies: Secondary | ICD-10-CM | POA: Diagnosis not present

## 2018-08-12 DIAGNOSIS — I509 Heart failure, unspecified: Secondary | ICD-10-CM | POA: Diagnosis not present

## 2018-08-12 DIAGNOSIS — I951 Orthostatic hypotension: Secondary | ICD-10-CM | POA: Diagnosis not present

## 2018-08-13 DIAGNOSIS — I951 Orthostatic hypotension: Secondary | ICD-10-CM | POA: Diagnosis not present

## 2018-08-13 DIAGNOSIS — R1310 Dysphagia, unspecified: Secondary | ICD-10-CM | POA: Diagnosis not present

## 2018-08-13 DIAGNOSIS — G3183 Dementia with Lewy bodies: Secondary | ICD-10-CM | POA: Diagnosis not present

## 2018-08-13 DIAGNOSIS — G20 Parkinson's disease: Secondary | ICD-10-CM | POA: Diagnosis not present

## 2018-08-13 DIAGNOSIS — I509 Heart failure, unspecified: Secondary | ICD-10-CM | POA: Diagnosis not present

## 2018-08-13 DIAGNOSIS — G2401 Drug induced subacute dyskinesia: Secondary | ICD-10-CM | POA: Diagnosis not present

## 2018-08-17 DIAGNOSIS — I951 Orthostatic hypotension: Secondary | ICD-10-CM | POA: Diagnosis not present

## 2018-08-17 DIAGNOSIS — G2401 Drug induced subacute dyskinesia: Secondary | ICD-10-CM | POA: Diagnosis not present

## 2018-08-17 DIAGNOSIS — I509 Heart failure, unspecified: Secondary | ICD-10-CM | POA: Diagnosis not present

## 2018-08-17 DIAGNOSIS — R1310 Dysphagia, unspecified: Secondary | ICD-10-CM | POA: Diagnosis not present

## 2018-08-17 DIAGNOSIS — G3183 Dementia with Lewy bodies: Secondary | ICD-10-CM | POA: Diagnosis not present

## 2018-08-17 DIAGNOSIS — G20 Parkinson's disease: Secondary | ICD-10-CM | POA: Diagnosis not present

## 2018-08-19 DIAGNOSIS — G3183 Dementia with Lewy bodies: Secondary | ICD-10-CM | POA: Diagnosis not present

## 2018-08-19 DIAGNOSIS — I951 Orthostatic hypotension: Secondary | ICD-10-CM | POA: Diagnosis not present

## 2018-08-19 DIAGNOSIS — I509 Heart failure, unspecified: Secondary | ICD-10-CM | POA: Diagnosis not present

## 2018-08-19 DIAGNOSIS — G2401 Drug induced subacute dyskinesia: Secondary | ICD-10-CM | POA: Diagnosis not present

## 2018-08-19 DIAGNOSIS — G20 Parkinson's disease: Secondary | ICD-10-CM | POA: Diagnosis not present

## 2018-08-19 DIAGNOSIS — R1310 Dysphagia, unspecified: Secondary | ICD-10-CM | POA: Diagnosis not present

## 2018-08-21 DIAGNOSIS — R1310 Dysphagia, unspecified: Secondary | ICD-10-CM | POA: Diagnosis not present

## 2018-08-21 DIAGNOSIS — G3183 Dementia with Lewy bodies: Secondary | ICD-10-CM | POA: Diagnosis not present

## 2018-08-21 DIAGNOSIS — I509 Heart failure, unspecified: Secondary | ICD-10-CM | POA: Diagnosis not present

## 2018-08-21 DIAGNOSIS — G20 Parkinson's disease: Secondary | ICD-10-CM | POA: Diagnosis not present

## 2018-08-21 DIAGNOSIS — G2401 Drug induced subacute dyskinesia: Secondary | ICD-10-CM | POA: Diagnosis not present

## 2018-08-21 DIAGNOSIS — I951 Orthostatic hypotension: Secondary | ICD-10-CM | POA: Diagnosis not present

## 2018-08-23 DIAGNOSIS — I951 Orthostatic hypotension: Secondary | ICD-10-CM | POA: Diagnosis not present

## 2018-08-23 DIAGNOSIS — G2401 Drug induced subacute dyskinesia: Secondary | ICD-10-CM | POA: Diagnosis not present

## 2018-08-23 DIAGNOSIS — I509 Heart failure, unspecified: Secondary | ICD-10-CM | POA: Diagnosis not present

## 2018-08-23 DIAGNOSIS — H409 Unspecified glaucoma: Secondary | ICD-10-CM | POA: Diagnosis not present

## 2018-08-23 DIAGNOSIS — R1310 Dysphagia, unspecified: Secondary | ICD-10-CM | POA: Diagnosis not present

## 2018-08-23 DIAGNOSIS — F411 Generalized anxiety disorder: Secondary | ICD-10-CM | POA: Diagnosis not present

## 2018-08-23 DIAGNOSIS — H11149 Conjunctival xerosis, unspecified, unspecified eye: Secondary | ICD-10-CM | POA: Diagnosis not present

## 2018-08-23 DIAGNOSIS — G20 Parkinson's disease: Secondary | ICD-10-CM | POA: Diagnosis not present

## 2018-08-23 DIAGNOSIS — E785 Hyperlipidemia, unspecified: Secondary | ICD-10-CM | POA: Diagnosis not present

## 2018-08-23 DIAGNOSIS — E039 Hypothyroidism, unspecified: Secondary | ICD-10-CM | POA: Diagnosis not present

## 2018-08-23 DIAGNOSIS — I679 Cerebrovascular disease, unspecified: Secondary | ICD-10-CM | POA: Diagnosis not present

## 2018-08-23 DIAGNOSIS — I10 Essential (primary) hypertension: Secondary | ICD-10-CM | POA: Diagnosis not present

## 2018-08-23 DIAGNOSIS — L8990 Pressure ulcer of unspecified site, unspecified stage: Secondary | ICD-10-CM | POA: Diagnosis not present

## 2018-08-23 DIAGNOSIS — G3183 Dementia with Lewy bodies: Secondary | ICD-10-CM | POA: Diagnosis not present

## 2018-08-23 DIAGNOSIS — K219 Gastro-esophageal reflux disease without esophagitis: Secondary | ICD-10-CM | POA: Diagnosis not present

## 2018-08-24 DIAGNOSIS — G3183 Dementia with Lewy bodies: Secondary | ICD-10-CM | POA: Diagnosis not present

## 2018-08-24 DIAGNOSIS — I951 Orthostatic hypotension: Secondary | ICD-10-CM | POA: Diagnosis not present

## 2018-08-24 DIAGNOSIS — G20 Parkinson's disease: Secondary | ICD-10-CM | POA: Diagnosis not present

## 2018-08-24 DIAGNOSIS — R1310 Dysphagia, unspecified: Secondary | ICD-10-CM | POA: Diagnosis not present

## 2018-08-24 DIAGNOSIS — G2401 Drug induced subacute dyskinesia: Secondary | ICD-10-CM | POA: Diagnosis not present

## 2018-08-24 DIAGNOSIS — I509 Heart failure, unspecified: Secondary | ICD-10-CM | POA: Diagnosis not present

## 2018-08-26 DIAGNOSIS — M6281 Muscle weakness (generalized): Secondary | ICD-10-CM | POA: Diagnosis not present

## 2018-08-26 DIAGNOSIS — G20 Parkinson's disease: Secondary | ICD-10-CM | POA: Diagnosis not present

## 2018-08-26 DIAGNOSIS — I951 Orthostatic hypotension: Secondary | ICD-10-CM | POA: Diagnosis not present

## 2018-08-26 DIAGNOSIS — G3183 Dementia with Lewy bodies: Secondary | ICD-10-CM | POA: Diagnosis not present

## 2018-08-26 DIAGNOSIS — G2401 Drug induced subacute dyskinesia: Secondary | ICD-10-CM | POA: Diagnosis not present

## 2018-08-26 DIAGNOSIS — F0391 Unspecified dementia with behavioral disturbance: Secondary | ICD-10-CM | POA: Diagnosis not present

## 2018-08-26 DIAGNOSIS — L89154 Pressure ulcer of sacral region, stage 4: Secondary | ICD-10-CM | POA: Diagnosis not present

## 2018-08-26 DIAGNOSIS — I509 Heart failure, unspecified: Secondary | ICD-10-CM | POA: Diagnosis not present

## 2018-08-26 DIAGNOSIS — R1310 Dysphagia, unspecified: Secondary | ICD-10-CM | POA: Diagnosis not present

## 2018-08-27 DIAGNOSIS — G3183 Dementia with Lewy bodies: Secondary | ICD-10-CM | POA: Diagnosis not present

## 2018-08-27 DIAGNOSIS — I951 Orthostatic hypotension: Secondary | ICD-10-CM | POA: Diagnosis not present

## 2018-08-27 DIAGNOSIS — G2401 Drug induced subacute dyskinesia: Secondary | ICD-10-CM | POA: Diagnosis not present

## 2018-08-27 DIAGNOSIS — R1310 Dysphagia, unspecified: Secondary | ICD-10-CM | POA: Diagnosis not present

## 2018-08-27 DIAGNOSIS — G20 Parkinson's disease: Secondary | ICD-10-CM | POA: Diagnosis not present

## 2018-08-27 DIAGNOSIS — I509 Heart failure, unspecified: Secondary | ICD-10-CM | POA: Diagnosis not present

## 2018-08-31 DIAGNOSIS — L89154 Pressure ulcer of sacral region, stage 4: Secondary | ICD-10-CM | POA: Diagnosis not present

## 2018-08-31 DIAGNOSIS — G20 Parkinson's disease: Secondary | ICD-10-CM | POA: Diagnosis not present

## 2018-08-31 DIAGNOSIS — G2401 Drug induced subacute dyskinesia: Secondary | ICD-10-CM | POA: Diagnosis not present

## 2018-08-31 DIAGNOSIS — F0390 Unspecified dementia without behavioral disturbance: Secondary | ICD-10-CM | POA: Diagnosis not present

## 2018-08-31 DIAGNOSIS — I951 Orthostatic hypotension: Secondary | ICD-10-CM | POA: Diagnosis not present

## 2018-08-31 DIAGNOSIS — I509 Heart failure, unspecified: Secondary | ICD-10-CM | POA: Diagnosis not present

## 2018-08-31 DIAGNOSIS — R1310 Dysphagia, unspecified: Secondary | ICD-10-CM | POA: Diagnosis not present

## 2018-08-31 DIAGNOSIS — G3183 Dementia with Lewy bodies: Secondary | ICD-10-CM | POA: Diagnosis not present

## 2018-09-01 DIAGNOSIS — I509 Heart failure, unspecified: Secondary | ICD-10-CM | POA: Diagnosis not present

## 2018-09-01 DIAGNOSIS — G2401 Drug induced subacute dyskinesia: Secondary | ICD-10-CM | POA: Diagnosis not present

## 2018-09-01 DIAGNOSIS — G3183 Dementia with Lewy bodies: Secondary | ICD-10-CM | POA: Diagnosis not present

## 2018-09-01 DIAGNOSIS — I951 Orthostatic hypotension: Secondary | ICD-10-CM | POA: Diagnosis not present

## 2018-09-01 DIAGNOSIS — G20 Parkinson's disease: Secondary | ICD-10-CM | POA: Diagnosis not present

## 2018-09-01 DIAGNOSIS — R1310 Dysphagia, unspecified: Secondary | ICD-10-CM | POA: Diagnosis not present

## 2018-09-02 DIAGNOSIS — S51809A Unspecified open wound of unspecified forearm, initial encounter: Secondary | ICD-10-CM | POA: Diagnosis not present

## 2018-09-02 DIAGNOSIS — G3183 Dementia with Lewy bodies: Secondary | ICD-10-CM | POA: Diagnosis not present

## 2018-09-02 DIAGNOSIS — G2401 Drug induced subacute dyskinesia: Secondary | ICD-10-CM | POA: Diagnosis not present

## 2018-09-02 DIAGNOSIS — I509 Heart failure, unspecified: Secondary | ICD-10-CM | POA: Diagnosis not present

## 2018-09-02 DIAGNOSIS — G20 Parkinson's disease: Secondary | ICD-10-CM | POA: Diagnosis not present

## 2018-09-02 DIAGNOSIS — R1310 Dysphagia, unspecified: Secondary | ICD-10-CM | POA: Diagnosis not present

## 2018-09-02 DIAGNOSIS — F0390 Unspecified dementia without behavioral disturbance: Secondary | ICD-10-CM | POA: Diagnosis not present

## 2018-09-02 DIAGNOSIS — L89154 Pressure ulcer of sacral region, stage 4: Secondary | ICD-10-CM | POA: Diagnosis not present

## 2018-09-02 DIAGNOSIS — M6281 Muscle weakness (generalized): Secondary | ICD-10-CM | POA: Diagnosis not present

## 2018-09-02 DIAGNOSIS — I951 Orthostatic hypotension: Secondary | ICD-10-CM | POA: Diagnosis not present

## 2018-09-04 DIAGNOSIS — G2401 Drug induced subacute dyskinesia: Secondary | ICD-10-CM | POA: Diagnosis not present

## 2018-09-04 DIAGNOSIS — G20 Parkinson's disease: Secondary | ICD-10-CM | POA: Diagnosis not present

## 2018-09-04 DIAGNOSIS — R1310 Dysphagia, unspecified: Secondary | ICD-10-CM | POA: Diagnosis not present

## 2018-09-04 DIAGNOSIS — G3183 Dementia with Lewy bodies: Secondary | ICD-10-CM | POA: Diagnosis not present

## 2018-09-04 DIAGNOSIS — I509 Heart failure, unspecified: Secondary | ICD-10-CM | POA: Diagnosis not present

## 2018-09-04 DIAGNOSIS — I951 Orthostatic hypotension: Secondary | ICD-10-CM | POA: Diagnosis not present

## 2018-09-06 DIAGNOSIS — G2401 Drug induced subacute dyskinesia: Secondary | ICD-10-CM | POA: Diagnosis not present

## 2018-09-06 DIAGNOSIS — G20 Parkinson's disease: Secondary | ICD-10-CM | POA: Diagnosis not present

## 2018-09-06 DIAGNOSIS — R1310 Dysphagia, unspecified: Secondary | ICD-10-CM | POA: Diagnosis not present

## 2018-09-06 DIAGNOSIS — I951 Orthostatic hypotension: Secondary | ICD-10-CM | POA: Diagnosis not present

## 2018-09-06 DIAGNOSIS — I509 Heart failure, unspecified: Secondary | ICD-10-CM | POA: Diagnosis not present

## 2018-09-06 DIAGNOSIS — G3183 Dementia with Lewy bodies: Secondary | ICD-10-CM | POA: Diagnosis not present

## 2018-09-08 DIAGNOSIS — G20 Parkinson's disease: Secondary | ICD-10-CM | POA: Diagnosis not present

## 2018-09-08 DIAGNOSIS — G3183 Dementia with Lewy bodies: Secondary | ICD-10-CM | POA: Diagnosis not present

## 2018-09-08 DIAGNOSIS — R1310 Dysphagia, unspecified: Secondary | ICD-10-CM | POA: Diagnosis not present

## 2018-09-08 DIAGNOSIS — I951 Orthostatic hypotension: Secondary | ICD-10-CM | POA: Diagnosis not present

## 2018-09-08 DIAGNOSIS — G2401 Drug induced subacute dyskinesia: Secondary | ICD-10-CM | POA: Diagnosis not present

## 2018-09-08 DIAGNOSIS — I509 Heart failure, unspecified: Secondary | ICD-10-CM | POA: Diagnosis not present

## 2018-09-08 DIAGNOSIS — N390 Urinary tract infection, site not specified: Secondary | ICD-10-CM | POA: Diagnosis not present

## 2018-09-09 DIAGNOSIS — I10 Essential (primary) hypertension: Secondary | ICD-10-CM | POA: Diagnosis not present

## 2018-09-09 DIAGNOSIS — I509 Heart failure, unspecified: Secondary | ICD-10-CM | POA: Diagnosis not present

## 2018-09-09 DIAGNOSIS — I951 Orthostatic hypotension: Secondary | ICD-10-CM | POA: Diagnosis not present

## 2018-09-09 DIAGNOSIS — F29 Unspecified psychosis not due to a substance or known physiological condition: Secondary | ICD-10-CM | POA: Diagnosis not present

## 2018-09-09 DIAGNOSIS — Z66 Do not resuscitate: Secondary | ICD-10-CM | POA: Diagnosis not present

## 2018-09-09 DIAGNOSIS — M6281 Muscle weakness (generalized): Secondary | ICD-10-CM | POA: Diagnosis not present

## 2018-09-09 DIAGNOSIS — F0390 Unspecified dementia without behavioral disturbance: Secondary | ICD-10-CM | POA: Diagnosis not present

## 2018-09-09 DIAGNOSIS — G3183 Dementia with Lewy bodies: Secondary | ICD-10-CM | POA: Diagnosis not present

## 2018-09-09 DIAGNOSIS — L89154 Pressure ulcer of sacral region, stage 4: Secondary | ICD-10-CM | POA: Diagnosis not present

## 2018-09-09 DIAGNOSIS — G20 Parkinson's disease: Secondary | ICD-10-CM | POA: Diagnosis not present

## 2018-09-09 DIAGNOSIS — G2401 Drug induced subacute dyskinesia: Secondary | ICD-10-CM | POA: Diagnosis not present

## 2018-09-09 DIAGNOSIS — R1310 Dysphagia, unspecified: Secondary | ICD-10-CM | POA: Diagnosis not present

## 2018-09-14 DIAGNOSIS — F0391 Unspecified dementia with behavioral disturbance: Secondary | ICD-10-CM | POA: Diagnosis not present

## 2018-09-14 DIAGNOSIS — G2401 Drug induced subacute dyskinesia: Secondary | ICD-10-CM | POA: Diagnosis not present

## 2018-09-14 DIAGNOSIS — G3183 Dementia with Lewy bodies: Secondary | ICD-10-CM | POA: Diagnosis not present

## 2018-09-14 DIAGNOSIS — G20 Parkinson's disease: Secondary | ICD-10-CM | POA: Diagnosis not present

## 2018-09-14 DIAGNOSIS — L89154 Pressure ulcer of sacral region, stage 4: Secondary | ICD-10-CM | POA: Diagnosis not present

## 2018-09-14 DIAGNOSIS — I10 Essential (primary) hypertension: Secondary | ICD-10-CM | POA: Diagnosis not present

## 2018-09-14 DIAGNOSIS — I509 Heart failure, unspecified: Secondary | ICD-10-CM | POA: Diagnosis not present

## 2018-09-14 DIAGNOSIS — I951 Orthostatic hypotension: Secondary | ICD-10-CM | POA: Diagnosis not present

## 2018-09-14 DIAGNOSIS — R1310 Dysphagia, unspecified: Secondary | ICD-10-CM | POA: Diagnosis not present

## 2018-09-15 DIAGNOSIS — I951 Orthostatic hypotension: Secondary | ICD-10-CM | POA: Diagnosis not present

## 2018-09-15 DIAGNOSIS — G20 Parkinson's disease: Secondary | ICD-10-CM | POA: Diagnosis not present

## 2018-09-15 DIAGNOSIS — I509 Heart failure, unspecified: Secondary | ICD-10-CM | POA: Diagnosis not present

## 2018-09-15 DIAGNOSIS — G3183 Dementia with Lewy bodies: Secondary | ICD-10-CM | POA: Diagnosis not present

## 2018-09-15 DIAGNOSIS — R1310 Dysphagia, unspecified: Secondary | ICD-10-CM | POA: Diagnosis not present

## 2018-09-15 DIAGNOSIS — G2401 Drug induced subacute dyskinesia: Secondary | ICD-10-CM | POA: Diagnosis not present

## 2018-09-21 DIAGNOSIS — G3183 Dementia with Lewy bodies: Secondary | ICD-10-CM | POA: Diagnosis not present

## 2018-09-21 DIAGNOSIS — I951 Orthostatic hypotension: Secondary | ICD-10-CM | POA: Diagnosis not present

## 2018-09-21 DIAGNOSIS — G20 Parkinson's disease: Secondary | ICD-10-CM | POA: Diagnosis not present

## 2018-09-21 DIAGNOSIS — I509 Heart failure, unspecified: Secondary | ICD-10-CM | POA: Diagnosis not present

## 2018-09-21 DIAGNOSIS — R1310 Dysphagia, unspecified: Secondary | ICD-10-CM | POA: Diagnosis not present

## 2018-09-21 DIAGNOSIS — G2401 Drug induced subacute dyskinesia: Secondary | ICD-10-CM | POA: Diagnosis not present

## 2018-09-22 DIAGNOSIS — I509 Heart failure, unspecified: Secondary | ICD-10-CM | POA: Diagnosis not present

## 2018-09-22 DIAGNOSIS — I951 Orthostatic hypotension: Secondary | ICD-10-CM | POA: Diagnosis not present

## 2018-09-22 DIAGNOSIS — G3183 Dementia with Lewy bodies: Secondary | ICD-10-CM | POA: Diagnosis not present

## 2018-09-22 DIAGNOSIS — G20 Parkinson's disease: Secondary | ICD-10-CM | POA: Diagnosis not present

## 2018-09-22 DIAGNOSIS — R1310 Dysphagia, unspecified: Secondary | ICD-10-CM | POA: Diagnosis not present

## 2018-09-22 DIAGNOSIS — G2401 Drug induced subacute dyskinesia: Secondary | ICD-10-CM | POA: Diagnosis not present

## 2018-09-23 DIAGNOSIS — G20 Parkinson's disease: Secondary | ICD-10-CM | POA: Diagnosis not present

## 2018-09-23 DIAGNOSIS — I679 Cerebrovascular disease, unspecified: Secondary | ICD-10-CM | POA: Diagnosis not present

## 2018-09-23 DIAGNOSIS — G3183 Dementia with Lewy bodies: Secondary | ICD-10-CM | POA: Diagnosis not present

## 2018-09-23 DIAGNOSIS — H11149 Conjunctival xerosis, unspecified, unspecified eye: Secondary | ICD-10-CM | POA: Diagnosis not present

## 2018-09-23 DIAGNOSIS — G2401 Drug induced subacute dyskinesia: Secondary | ICD-10-CM | POA: Diagnosis not present

## 2018-09-23 DIAGNOSIS — I951 Orthostatic hypotension: Secondary | ICD-10-CM | POA: Diagnosis not present

## 2018-09-23 DIAGNOSIS — E039 Hypothyroidism, unspecified: Secondary | ICD-10-CM | POA: Diagnosis not present

## 2018-09-23 DIAGNOSIS — I10 Essential (primary) hypertension: Secondary | ICD-10-CM | POA: Diagnosis not present

## 2018-09-23 DIAGNOSIS — H409 Unspecified glaucoma: Secondary | ICD-10-CM | POA: Diagnosis not present

## 2018-09-23 DIAGNOSIS — E785 Hyperlipidemia, unspecified: Secondary | ICD-10-CM | POA: Diagnosis not present

## 2018-09-23 DIAGNOSIS — F411 Generalized anxiety disorder: Secondary | ICD-10-CM | POA: Diagnosis not present

## 2018-09-23 DIAGNOSIS — R1310 Dysphagia, unspecified: Secondary | ICD-10-CM | POA: Diagnosis not present

## 2018-09-23 DIAGNOSIS — L8990 Pressure ulcer of unspecified site, unspecified stage: Secondary | ICD-10-CM | POA: Diagnosis not present

## 2018-09-23 DIAGNOSIS — I509 Heart failure, unspecified: Secondary | ICD-10-CM | POA: Diagnosis not present

## 2018-09-23 DIAGNOSIS — K219 Gastro-esophageal reflux disease without esophagitis: Secondary | ICD-10-CM | POA: Diagnosis not present

## 2018-09-25 DIAGNOSIS — F0391 Unspecified dementia with behavioral disturbance: Secondary | ICD-10-CM | POA: Diagnosis not present

## 2018-09-25 DIAGNOSIS — I951 Orthostatic hypotension: Secondary | ICD-10-CM | POA: Diagnosis not present

## 2018-09-25 DIAGNOSIS — G20 Parkinson's disease: Secondary | ICD-10-CM | POA: Diagnosis not present

## 2018-09-25 DIAGNOSIS — G3183 Dementia with Lewy bodies: Secondary | ICD-10-CM | POA: Diagnosis not present

## 2018-09-25 DIAGNOSIS — G2401 Drug induced subacute dyskinesia: Secondary | ICD-10-CM | POA: Diagnosis not present

## 2018-09-25 DIAGNOSIS — R1310 Dysphagia, unspecified: Secondary | ICD-10-CM | POA: Diagnosis not present

## 2018-09-25 DIAGNOSIS — I509 Heart failure, unspecified: Secondary | ICD-10-CM | POA: Diagnosis not present

## 2018-09-25 DIAGNOSIS — I10 Essential (primary) hypertension: Secondary | ICD-10-CM | POA: Diagnosis not present

## 2018-09-25 DIAGNOSIS — L89154 Pressure ulcer of sacral region, stage 4: Secondary | ICD-10-CM | POA: Diagnosis not present

## 2018-09-28 DIAGNOSIS — G20 Parkinson's disease: Secondary | ICD-10-CM | POA: Diagnosis not present

## 2018-09-28 DIAGNOSIS — I951 Orthostatic hypotension: Secondary | ICD-10-CM | POA: Diagnosis not present

## 2018-09-28 DIAGNOSIS — G2401 Drug induced subacute dyskinesia: Secondary | ICD-10-CM | POA: Diagnosis not present

## 2018-09-28 DIAGNOSIS — R1310 Dysphagia, unspecified: Secondary | ICD-10-CM | POA: Diagnosis not present

## 2018-09-28 DIAGNOSIS — G3183 Dementia with Lewy bodies: Secondary | ICD-10-CM | POA: Diagnosis not present

## 2018-09-28 DIAGNOSIS — I509 Heart failure, unspecified: Secondary | ICD-10-CM | POA: Diagnosis not present

## 2018-09-29 DIAGNOSIS — I951 Orthostatic hypotension: Secondary | ICD-10-CM | POA: Diagnosis not present

## 2018-09-29 DIAGNOSIS — G3183 Dementia with Lewy bodies: Secondary | ICD-10-CM | POA: Diagnosis not present

## 2018-09-29 DIAGNOSIS — I509 Heart failure, unspecified: Secondary | ICD-10-CM | POA: Diagnosis not present

## 2018-09-29 DIAGNOSIS — G20 Parkinson's disease: Secondary | ICD-10-CM | POA: Diagnosis not present

## 2018-09-29 DIAGNOSIS — R1310 Dysphagia, unspecified: Secondary | ICD-10-CM | POA: Diagnosis not present

## 2018-09-29 DIAGNOSIS — G2401 Drug induced subacute dyskinesia: Secondary | ICD-10-CM | POA: Diagnosis not present

## 2018-09-30 DIAGNOSIS — L89896 Pressure-induced deep tissue damage of other site: Secondary | ICD-10-CM | POA: Diagnosis not present

## 2018-09-30 DIAGNOSIS — G2401 Drug induced subacute dyskinesia: Secondary | ICD-10-CM | POA: Diagnosis not present

## 2018-09-30 DIAGNOSIS — F0391 Unspecified dementia with behavioral disturbance: Secondary | ICD-10-CM | POA: Diagnosis not present

## 2018-09-30 DIAGNOSIS — G3183 Dementia with Lewy bodies: Secondary | ICD-10-CM | POA: Diagnosis not present

## 2018-09-30 DIAGNOSIS — G20 Parkinson's disease: Secondary | ICD-10-CM | POA: Diagnosis not present

## 2018-09-30 DIAGNOSIS — R1310 Dysphagia, unspecified: Secondary | ICD-10-CM | POA: Diagnosis not present

## 2018-09-30 DIAGNOSIS — I509 Heart failure, unspecified: Secondary | ICD-10-CM | POA: Diagnosis not present

## 2018-09-30 DIAGNOSIS — I951 Orthostatic hypotension: Secondary | ICD-10-CM | POA: Diagnosis not present

## 2018-09-30 DIAGNOSIS — L89154 Pressure ulcer of sacral region, stage 4: Secondary | ICD-10-CM | POA: Diagnosis not present

## 2018-09-30 DIAGNOSIS — L02416 Cutaneous abscess of left lower limb: Secondary | ICD-10-CM | POA: Diagnosis not present

## 2018-10-05 DIAGNOSIS — I951 Orthostatic hypotension: Secondary | ICD-10-CM | POA: Diagnosis not present

## 2018-10-05 DIAGNOSIS — I509 Heart failure, unspecified: Secondary | ICD-10-CM | POA: Diagnosis not present

## 2018-10-05 DIAGNOSIS — G20 Parkinson's disease: Secondary | ICD-10-CM | POA: Diagnosis not present

## 2018-10-05 DIAGNOSIS — R1310 Dysphagia, unspecified: Secondary | ICD-10-CM | POA: Diagnosis not present

## 2018-10-05 DIAGNOSIS — G3183 Dementia with Lewy bodies: Secondary | ICD-10-CM | POA: Diagnosis not present

## 2018-10-05 DIAGNOSIS — G2401 Drug induced subacute dyskinesia: Secondary | ICD-10-CM | POA: Diagnosis not present

## 2018-10-06 DIAGNOSIS — I951 Orthostatic hypotension: Secondary | ICD-10-CM | POA: Diagnosis not present

## 2018-10-06 DIAGNOSIS — G2401 Drug induced subacute dyskinesia: Secondary | ICD-10-CM | POA: Diagnosis not present

## 2018-10-06 DIAGNOSIS — G20 Parkinson's disease: Secondary | ICD-10-CM | POA: Diagnosis not present

## 2018-10-06 DIAGNOSIS — R1310 Dysphagia, unspecified: Secondary | ICD-10-CM | POA: Diagnosis not present

## 2018-10-06 DIAGNOSIS — G3183 Dementia with Lewy bodies: Secondary | ICD-10-CM | POA: Diagnosis not present

## 2018-10-06 DIAGNOSIS — I509 Heart failure, unspecified: Secondary | ICD-10-CM | POA: Diagnosis not present

## 2018-10-07 DIAGNOSIS — F411 Generalized anxiety disorder: Secondary | ICD-10-CM | POA: Diagnosis not present

## 2018-10-07 DIAGNOSIS — G2401 Drug induced subacute dyskinesia: Secondary | ICD-10-CM | POA: Diagnosis not present

## 2018-10-07 DIAGNOSIS — G3183 Dementia with Lewy bodies: Secondary | ICD-10-CM | POA: Diagnosis not present

## 2018-10-07 DIAGNOSIS — F0391 Unspecified dementia with behavioral disturbance: Secondary | ICD-10-CM | POA: Diagnosis not present

## 2018-10-07 DIAGNOSIS — I509 Heart failure, unspecified: Secondary | ICD-10-CM | POA: Diagnosis not present

## 2018-10-07 DIAGNOSIS — I951 Orthostatic hypotension: Secondary | ICD-10-CM | POA: Diagnosis not present

## 2018-10-07 DIAGNOSIS — F339 Major depressive disorder, recurrent, unspecified: Secondary | ICD-10-CM | POA: Diagnosis not present

## 2018-10-07 DIAGNOSIS — G20 Parkinson's disease: Secondary | ICD-10-CM | POA: Diagnosis not present

## 2018-10-07 DIAGNOSIS — R1310 Dysphagia, unspecified: Secondary | ICD-10-CM | POA: Diagnosis not present

## 2018-10-12 DIAGNOSIS — G3183 Dementia with Lewy bodies: Secondary | ICD-10-CM | POA: Diagnosis not present

## 2018-10-12 DIAGNOSIS — M79674 Pain in right toe(s): Secondary | ICD-10-CM | POA: Diagnosis not present

## 2018-10-12 DIAGNOSIS — B351 Tinea unguium: Secondary | ICD-10-CM | POA: Diagnosis not present

## 2018-10-12 DIAGNOSIS — M79675 Pain in left toe(s): Secondary | ICD-10-CM | POA: Diagnosis not present

## 2018-10-12 DIAGNOSIS — G2401 Drug induced subacute dyskinesia: Secondary | ICD-10-CM | POA: Diagnosis not present

## 2018-10-12 DIAGNOSIS — I951 Orthostatic hypotension: Secondary | ICD-10-CM | POA: Diagnosis not present

## 2018-10-12 DIAGNOSIS — R1310 Dysphagia, unspecified: Secondary | ICD-10-CM | POA: Diagnosis not present

## 2018-10-12 DIAGNOSIS — I509 Heart failure, unspecified: Secondary | ICD-10-CM | POA: Diagnosis not present

## 2018-10-12 DIAGNOSIS — G20 Parkinson's disease: Secondary | ICD-10-CM | POA: Diagnosis not present

## 2018-10-13 DIAGNOSIS — R1310 Dysphagia, unspecified: Secondary | ICD-10-CM | POA: Diagnosis not present

## 2018-10-13 DIAGNOSIS — G2401 Drug induced subacute dyskinesia: Secondary | ICD-10-CM | POA: Diagnosis not present

## 2018-10-13 DIAGNOSIS — I509 Heart failure, unspecified: Secondary | ICD-10-CM | POA: Diagnosis not present

## 2018-10-13 DIAGNOSIS — I951 Orthostatic hypotension: Secondary | ICD-10-CM | POA: Diagnosis not present

## 2018-10-13 DIAGNOSIS — G3183 Dementia with Lewy bodies: Secondary | ICD-10-CM | POA: Diagnosis not present

## 2018-10-13 DIAGNOSIS — G20 Parkinson's disease: Secondary | ICD-10-CM | POA: Diagnosis not present

## 2018-10-14 DIAGNOSIS — R1310 Dysphagia, unspecified: Secondary | ICD-10-CM | POA: Diagnosis not present

## 2018-10-14 DIAGNOSIS — G2401 Drug induced subacute dyskinesia: Secondary | ICD-10-CM | POA: Diagnosis not present

## 2018-10-14 DIAGNOSIS — I951 Orthostatic hypotension: Secondary | ICD-10-CM | POA: Diagnosis not present

## 2018-10-14 DIAGNOSIS — G3183 Dementia with Lewy bodies: Secondary | ICD-10-CM | POA: Diagnosis not present

## 2018-10-14 DIAGNOSIS — I509 Heart failure, unspecified: Secondary | ICD-10-CM | POA: Diagnosis not present

## 2018-10-14 DIAGNOSIS — G20 Parkinson's disease: Secondary | ICD-10-CM | POA: Diagnosis not present

## 2018-10-19 DIAGNOSIS — L89896 Pressure-induced deep tissue damage of other site: Secondary | ICD-10-CM | POA: Diagnosis not present

## 2018-10-19 DIAGNOSIS — L89154 Pressure ulcer of sacral region, stage 4: Secondary | ICD-10-CM | POA: Diagnosis not present

## 2018-10-19 DIAGNOSIS — I951 Orthostatic hypotension: Secondary | ICD-10-CM | POA: Diagnosis not present

## 2018-10-19 DIAGNOSIS — G2401 Drug induced subacute dyskinesia: Secondary | ICD-10-CM | POA: Diagnosis not present

## 2018-10-19 DIAGNOSIS — L02416 Cutaneous abscess of left lower limb: Secondary | ICD-10-CM | POA: Diagnosis not present

## 2018-10-19 DIAGNOSIS — R1310 Dysphagia, unspecified: Secondary | ICD-10-CM | POA: Diagnosis not present

## 2018-10-19 DIAGNOSIS — G20 Parkinson's disease: Secondary | ICD-10-CM | POA: Diagnosis not present

## 2018-10-19 DIAGNOSIS — G3183 Dementia with Lewy bodies: Secondary | ICD-10-CM | POA: Diagnosis not present

## 2018-10-19 DIAGNOSIS — I509 Heart failure, unspecified: Secondary | ICD-10-CM | POA: Diagnosis not present

## 2018-10-20 DIAGNOSIS — G2401 Drug induced subacute dyskinesia: Secondary | ICD-10-CM | POA: Diagnosis not present

## 2018-10-20 DIAGNOSIS — R1310 Dysphagia, unspecified: Secondary | ICD-10-CM | POA: Diagnosis not present

## 2018-10-20 DIAGNOSIS — I951 Orthostatic hypotension: Secondary | ICD-10-CM | POA: Diagnosis not present

## 2018-10-20 DIAGNOSIS — G20 Parkinson's disease: Secondary | ICD-10-CM | POA: Diagnosis not present

## 2018-10-20 DIAGNOSIS — G3183 Dementia with Lewy bodies: Secondary | ICD-10-CM | POA: Diagnosis not present

## 2018-10-20 DIAGNOSIS — I509 Heart failure, unspecified: Secondary | ICD-10-CM | POA: Diagnosis not present

## 2018-10-21 DIAGNOSIS — G20 Parkinson's disease: Secondary | ICD-10-CM | POA: Diagnosis not present

## 2018-10-21 DIAGNOSIS — I951 Orthostatic hypotension: Secondary | ICD-10-CM | POA: Diagnosis not present

## 2018-10-21 DIAGNOSIS — G2401 Drug induced subacute dyskinesia: Secondary | ICD-10-CM | POA: Diagnosis not present

## 2018-10-21 DIAGNOSIS — I509 Heart failure, unspecified: Secondary | ICD-10-CM | POA: Diagnosis not present

## 2018-10-21 DIAGNOSIS — R1310 Dysphagia, unspecified: Secondary | ICD-10-CM | POA: Diagnosis not present

## 2018-10-21 DIAGNOSIS — G3183 Dementia with Lewy bodies: Secondary | ICD-10-CM | POA: Diagnosis not present

## 2018-10-24 DIAGNOSIS — E039 Hypothyroidism, unspecified: Secondary | ICD-10-CM | POA: Diagnosis not present

## 2018-10-24 DIAGNOSIS — L8990 Pressure ulcer of unspecified site, unspecified stage: Secondary | ICD-10-CM | POA: Diagnosis not present

## 2018-10-24 DIAGNOSIS — K219 Gastro-esophageal reflux disease without esophagitis: Secondary | ICD-10-CM | POA: Diagnosis not present

## 2018-10-24 DIAGNOSIS — G2401 Drug induced subacute dyskinesia: Secondary | ICD-10-CM | POA: Diagnosis not present

## 2018-10-24 DIAGNOSIS — F411 Generalized anxiety disorder: Secondary | ICD-10-CM | POA: Diagnosis not present

## 2018-10-24 DIAGNOSIS — I951 Orthostatic hypotension: Secondary | ICD-10-CM | POA: Diagnosis not present

## 2018-10-24 DIAGNOSIS — E785 Hyperlipidemia, unspecified: Secondary | ICD-10-CM | POA: Diagnosis not present

## 2018-10-24 DIAGNOSIS — I10 Essential (primary) hypertension: Secondary | ICD-10-CM | POA: Diagnosis not present

## 2018-10-24 DIAGNOSIS — I679 Cerebrovascular disease, unspecified: Secondary | ICD-10-CM | POA: Diagnosis not present

## 2018-10-24 DIAGNOSIS — G3183 Dementia with Lewy bodies: Secondary | ICD-10-CM | POA: Diagnosis not present

## 2018-10-24 DIAGNOSIS — G20 Parkinson's disease: Secondary | ICD-10-CM | POA: Diagnosis not present

## 2018-10-24 DIAGNOSIS — H409 Unspecified glaucoma: Secondary | ICD-10-CM | POA: Diagnosis not present

## 2018-10-24 DIAGNOSIS — H11149 Conjunctival xerosis, unspecified, unspecified eye: Secondary | ICD-10-CM | POA: Diagnosis not present

## 2018-10-24 DIAGNOSIS — R1310 Dysphagia, unspecified: Secondary | ICD-10-CM | POA: Diagnosis not present

## 2018-10-24 DIAGNOSIS — I509 Heart failure, unspecified: Secondary | ICD-10-CM | POA: Diagnosis not present

## 2018-10-26 DIAGNOSIS — L03116 Cellulitis of left lower limb: Secondary | ICD-10-CM | POA: Diagnosis not present

## 2018-10-26 DIAGNOSIS — M6281 Muscle weakness (generalized): Secondary | ICD-10-CM | POA: Diagnosis not present

## 2018-10-26 DIAGNOSIS — L89896 Pressure-induced deep tissue damage of other site: Secondary | ICD-10-CM | POA: Diagnosis not present

## 2018-10-26 DIAGNOSIS — L89154 Pressure ulcer of sacral region, stage 4: Secondary | ICD-10-CM | POA: Diagnosis not present

## 2018-10-26 DIAGNOSIS — G2401 Drug induced subacute dyskinesia: Secondary | ICD-10-CM | POA: Diagnosis not present

## 2018-10-26 DIAGNOSIS — R1310 Dysphagia, unspecified: Secondary | ICD-10-CM | POA: Diagnosis not present

## 2018-10-26 DIAGNOSIS — G20 Parkinson's disease: Secondary | ICD-10-CM | POA: Diagnosis not present

## 2018-10-26 DIAGNOSIS — I509 Heart failure, unspecified: Secondary | ICD-10-CM | POA: Diagnosis not present

## 2018-10-26 DIAGNOSIS — G3183 Dementia with Lewy bodies: Secondary | ICD-10-CM | POA: Diagnosis not present

## 2018-10-26 DIAGNOSIS — I951 Orthostatic hypotension: Secondary | ICD-10-CM | POA: Diagnosis not present

## 2018-10-28 DIAGNOSIS — R1310 Dysphagia, unspecified: Secondary | ICD-10-CM | POA: Diagnosis not present

## 2018-10-28 DIAGNOSIS — G3183 Dementia with Lewy bodies: Secondary | ICD-10-CM | POA: Diagnosis not present

## 2018-10-28 DIAGNOSIS — I509 Heart failure, unspecified: Secondary | ICD-10-CM | POA: Diagnosis not present

## 2018-10-28 DIAGNOSIS — G2401 Drug induced subacute dyskinesia: Secondary | ICD-10-CM | POA: Diagnosis not present

## 2018-10-28 DIAGNOSIS — G20 Parkinson's disease: Secondary | ICD-10-CM | POA: Diagnosis not present

## 2018-10-28 DIAGNOSIS — I951 Orthostatic hypotension: Secondary | ICD-10-CM | POA: Diagnosis not present

## 2018-11-02 DIAGNOSIS — I509 Heart failure, unspecified: Secondary | ICD-10-CM | POA: Diagnosis not present

## 2018-11-02 DIAGNOSIS — R1310 Dysphagia, unspecified: Secondary | ICD-10-CM | POA: Diagnosis not present

## 2018-11-02 DIAGNOSIS — G2401 Drug induced subacute dyskinesia: Secondary | ICD-10-CM | POA: Diagnosis not present

## 2018-11-02 DIAGNOSIS — G20 Parkinson's disease: Secondary | ICD-10-CM | POA: Diagnosis not present

## 2018-11-02 DIAGNOSIS — G3183 Dementia with Lewy bodies: Secondary | ICD-10-CM | POA: Diagnosis not present

## 2018-11-02 DIAGNOSIS — I951 Orthostatic hypotension: Secondary | ICD-10-CM | POA: Diagnosis not present

## 2018-11-04 DIAGNOSIS — R1310 Dysphagia, unspecified: Secondary | ICD-10-CM | POA: Diagnosis not present

## 2018-11-04 DIAGNOSIS — F411 Generalized anxiety disorder: Secondary | ICD-10-CM | POA: Diagnosis not present

## 2018-11-04 DIAGNOSIS — I10 Essential (primary) hypertension: Secondary | ICD-10-CM | POA: Diagnosis not present

## 2018-11-04 DIAGNOSIS — M6281 Muscle weakness (generalized): Secondary | ICD-10-CM | POA: Diagnosis not present

## 2018-11-04 DIAGNOSIS — G2401 Drug induced subacute dyskinesia: Secondary | ICD-10-CM | POA: Diagnosis not present

## 2018-11-04 DIAGNOSIS — I951 Orthostatic hypotension: Secondary | ICD-10-CM | POA: Diagnosis not present

## 2018-11-04 DIAGNOSIS — F29 Unspecified psychosis not due to a substance or known physiological condition: Secondary | ICD-10-CM | POA: Diagnosis not present

## 2018-11-04 DIAGNOSIS — G3183 Dementia with Lewy bodies: Secondary | ICD-10-CM | POA: Diagnosis not present

## 2018-11-04 DIAGNOSIS — L02416 Cutaneous abscess of left lower limb: Secondary | ICD-10-CM | POA: Diagnosis not present

## 2018-11-04 DIAGNOSIS — Z66 Do not resuscitate: Secondary | ICD-10-CM | POA: Diagnosis not present

## 2018-11-04 DIAGNOSIS — L89896 Pressure-induced deep tissue damage of other site: Secondary | ICD-10-CM | POA: Diagnosis not present

## 2018-11-04 DIAGNOSIS — G20 Parkinson's disease: Secondary | ICD-10-CM | POA: Diagnosis not present

## 2018-11-04 DIAGNOSIS — I509 Heart failure, unspecified: Secondary | ICD-10-CM | POA: Diagnosis not present

## 2018-11-04 DIAGNOSIS — L89154 Pressure ulcer of sacral region, stage 4: Secondary | ICD-10-CM | POA: Diagnosis not present

## 2018-11-05 DIAGNOSIS — G3183 Dementia with Lewy bodies: Secondary | ICD-10-CM | POA: Diagnosis not present

## 2018-11-05 DIAGNOSIS — R1310 Dysphagia, unspecified: Secondary | ICD-10-CM | POA: Diagnosis not present

## 2018-11-05 DIAGNOSIS — G20 Parkinson's disease: Secondary | ICD-10-CM | POA: Diagnosis not present

## 2018-11-05 DIAGNOSIS — G2401 Drug induced subacute dyskinesia: Secondary | ICD-10-CM | POA: Diagnosis not present

## 2018-11-05 DIAGNOSIS — I951 Orthostatic hypotension: Secondary | ICD-10-CM | POA: Diagnosis not present

## 2018-11-05 DIAGNOSIS — I509 Heart failure, unspecified: Secondary | ICD-10-CM | POA: Diagnosis not present

## 2018-11-09 DIAGNOSIS — L89154 Pressure ulcer of sacral region, stage 4: Secondary | ICD-10-CM | POA: Diagnosis not present

## 2018-11-09 DIAGNOSIS — I951 Orthostatic hypotension: Secondary | ICD-10-CM | POA: Diagnosis not present

## 2018-11-09 DIAGNOSIS — I509 Heart failure, unspecified: Secondary | ICD-10-CM | POA: Diagnosis not present

## 2018-11-09 DIAGNOSIS — G20 Parkinson's disease: Secondary | ICD-10-CM | POA: Diagnosis not present

## 2018-11-09 DIAGNOSIS — L02416 Cutaneous abscess of left lower limb: Secondary | ICD-10-CM | POA: Diagnosis not present

## 2018-11-09 DIAGNOSIS — F0391 Unspecified dementia with behavioral disturbance: Secondary | ICD-10-CM | POA: Diagnosis not present

## 2018-11-09 DIAGNOSIS — L89896 Pressure-induced deep tissue damage of other site: Secondary | ICD-10-CM | POA: Diagnosis not present

## 2018-11-09 DIAGNOSIS — G2401 Drug induced subacute dyskinesia: Secondary | ICD-10-CM | POA: Diagnosis not present

## 2018-11-09 DIAGNOSIS — G3183 Dementia with Lewy bodies: Secondary | ICD-10-CM | POA: Diagnosis not present

## 2018-11-09 DIAGNOSIS — R1310 Dysphagia, unspecified: Secondary | ICD-10-CM | POA: Diagnosis not present

## 2018-11-11 DIAGNOSIS — R1310 Dysphagia, unspecified: Secondary | ICD-10-CM | POA: Diagnosis not present

## 2018-11-11 DIAGNOSIS — I509 Heart failure, unspecified: Secondary | ICD-10-CM | POA: Diagnosis not present

## 2018-11-11 DIAGNOSIS — G2401 Drug induced subacute dyskinesia: Secondary | ICD-10-CM | POA: Diagnosis not present

## 2018-11-11 DIAGNOSIS — G20 Parkinson's disease: Secondary | ICD-10-CM | POA: Diagnosis not present

## 2018-11-11 DIAGNOSIS — I951 Orthostatic hypotension: Secondary | ICD-10-CM | POA: Diagnosis not present

## 2018-11-11 DIAGNOSIS — G3183 Dementia with Lewy bodies: Secondary | ICD-10-CM | POA: Diagnosis not present

## 2018-11-12 DIAGNOSIS — G3183 Dementia with Lewy bodies: Secondary | ICD-10-CM | POA: Diagnosis not present

## 2018-11-12 DIAGNOSIS — I951 Orthostatic hypotension: Secondary | ICD-10-CM | POA: Diagnosis not present

## 2018-11-12 DIAGNOSIS — G20 Parkinson's disease: Secondary | ICD-10-CM | POA: Diagnosis not present

## 2018-11-12 DIAGNOSIS — G2401 Drug induced subacute dyskinesia: Secondary | ICD-10-CM | POA: Diagnosis not present

## 2018-11-12 DIAGNOSIS — I509 Heart failure, unspecified: Secondary | ICD-10-CM | POA: Diagnosis not present

## 2018-11-12 DIAGNOSIS — R1310 Dysphagia, unspecified: Secondary | ICD-10-CM | POA: Diagnosis not present

## 2018-11-16 DIAGNOSIS — R1310 Dysphagia, unspecified: Secondary | ICD-10-CM | POA: Diagnosis not present

## 2018-11-16 DIAGNOSIS — I951 Orthostatic hypotension: Secondary | ICD-10-CM | POA: Diagnosis not present

## 2018-11-16 DIAGNOSIS — G3183 Dementia with Lewy bodies: Secondary | ICD-10-CM | POA: Diagnosis not present

## 2018-11-16 DIAGNOSIS — G20 Parkinson's disease: Secondary | ICD-10-CM | POA: Diagnosis not present

## 2018-11-16 DIAGNOSIS — I509 Heart failure, unspecified: Secondary | ICD-10-CM | POA: Diagnosis not present

## 2018-11-16 DIAGNOSIS — G2401 Drug induced subacute dyskinesia: Secondary | ICD-10-CM | POA: Diagnosis not present

## 2018-11-17 DIAGNOSIS — G3183 Dementia with Lewy bodies: Secondary | ICD-10-CM | POA: Diagnosis not present

## 2018-11-17 DIAGNOSIS — I509 Heart failure, unspecified: Secondary | ICD-10-CM | POA: Diagnosis not present

## 2018-11-17 DIAGNOSIS — I951 Orthostatic hypotension: Secondary | ICD-10-CM | POA: Diagnosis not present

## 2018-11-17 DIAGNOSIS — G2401 Drug induced subacute dyskinesia: Secondary | ICD-10-CM | POA: Diagnosis not present

## 2018-11-17 DIAGNOSIS — G20 Parkinson's disease: Secondary | ICD-10-CM | POA: Diagnosis not present

## 2018-11-17 DIAGNOSIS — R1310 Dysphagia, unspecified: Secondary | ICD-10-CM | POA: Diagnosis not present

## 2018-11-18 DIAGNOSIS — M6281 Muscle weakness (generalized): Secondary | ICD-10-CM | POA: Diagnosis not present

## 2018-11-18 DIAGNOSIS — G2401 Drug induced subacute dyskinesia: Secondary | ICD-10-CM | POA: Diagnosis not present

## 2018-11-18 DIAGNOSIS — L02416 Cutaneous abscess of left lower limb: Secondary | ICD-10-CM | POA: Diagnosis not present

## 2018-11-18 DIAGNOSIS — G3183 Dementia with Lewy bodies: Secondary | ICD-10-CM | POA: Diagnosis not present

## 2018-11-18 DIAGNOSIS — L89896 Pressure-induced deep tissue damage of other site: Secondary | ICD-10-CM | POA: Diagnosis not present

## 2018-11-18 DIAGNOSIS — I509 Heart failure, unspecified: Secondary | ICD-10-CM | POA: Diagnosis not present

## 2018-11-18 DIAGNOSIS — I951 Orthostatic hypotension: Secondary | ICD-10-CM | POA: Diagnosis not present

## 2018-11-18 DIAGNOSIS — L89154 Pressure ulcer of sacral region, stage 4: Secondary | ICD-10-CM | POA: Diagnosis not present

## 2018-11-18 DIAGNOSIS — G20 Parkinson's disease: Secondary | ICD-10-CM | POA: Diagnosis not present

## 2018-11-18 DIAGNOSIS — R1310 Dysphagia, unspecified: Secondary | ICD-10-CM | POA: Diagnosis not present

## 2018-11-20 DIAGNOSIS — I951 Orthostatic hypotension: Secondary | ICD-10-CM | POA: Diagnosis not present

## 2018-11-20 DIAGNOSIS — G3183 Dementia with Lewy bodies: Secondary | ICD-10-CM | POA: Diagnosis not present

## 2018-11-20 DIAGNOSIS — R1310 Dysphagia, unspecified: Secondary | ICD-10-CM | POA: Diagnosis not present

## 2018-11-20 DIAGNOSIS — I509 Heart failure, unspecified: Secondary | ICD-10-CM | POA: Diagnosis not present

## 2018-11-20 DIAGNOSIS — G2401 Drug induced subacute dyskinesia: Secondary | ICD-10-CM | POA: Diagnosis not present

## 2018-11-20 DIAGNOSIS — G20 Parkinson's disease: Secondary | ICD-10-CM | POA: Diagnosis not present

## 2018-11-22 DIAGNOSIS — E785 Hyperlipidemia, unspecified: Secondary | ICD-10-CM | POA: Diagnosis not present

## 2018-11-22 DIAGNOSIS — I509 Heart failure, unspecified: Secondary | ICD-10-CM | POA: Diagnosis not present

## 2018-11-22 DIAGNOSIS — G20 Parkinson's disease: Secondary | ICD-10-CM | POA: Diagnosis not present

## 2018-11-22 DIAGNOSIS — R1310 Dysphagia, unspecified: Secondary | ICD-10-CM | POA: Diagnosis not present

## 2018-11-22 DIAGNOSIS — F411 Generalized anxiety disorder: Secondary | ICD-10-CM | POA: Diagnosis not present

## 2018-11-22 DIAGNOSIS — H11149 Conjunctival xerosis, unspecified, unspecified eye: Secondary | ICD-10-CM | POA: Diagnosis not present

## 2018-11-22 DIAGNOSIS — E039 Hypothyroidism, unspecified: Secondary | ICD-10-CM | POA: Diagnosis not present

## 2018-11-22 DIAGNOSIS — L8990 Pressure ulcer of unspecified site, unspecified stage: Secondary | ICD-10-CM | POA: Diagnosis not present

## 2018-11-22 DIAGNOSIS — H409 Unspecified glaucoma: Secondary | ICD-10-CM | POA: Diagnosis not present

## 2018-11-22 DIAGNOSIS — I10 Essential (primary) hypertension: Secondary | ICD-10-CM | POA: Diagnosis not present

## 2018-11-22 DIAGNOSIS — I951 Orthostatic hypotension: Secondary | ICD-10-CM | POA: Diagnosis not present

## 2018-11-22 DIAGNOSIS — G3183 Dementia with Lewy bodies: Secondary | ICD-10-CM | POA: Diagnosis not present

## 2018-11-22 DIAGNOSIS — G2401 Drug induced subacute dyskinesia: Secondary | ICD-10-CM | POA: Diagnosis not present

## 2018-11-22 DIAGNOSIS — K219 Gastro-esophageal reflux disease without esophagitis: Secondary | ICD-10-CM | POA: Diagnosis not present

## 2018-11-22 DIAGNOSIS — I679 Cerebrovascular disease, unspecified: Secondary | ICD-10-CM | POA: Diagnosis not present

## 2018-11-23 DIAGNOSIS — G20 Parkinson's disease: Secondary | ICD-10-CM | POA: Diagnosis not present

## 2018-11-23 DIAGNOSIS — I951 Orthostatic hypotension: Secondary | ICD-10-CM | POA: Diagnosis not present

## 2018-11-23 DIAGNOSIS — G3183 Dementia with Lewy bodies: Secondary | ICD-10-CM | POA: Diagnosis not present

## 2018-11-23 DIAGNOSIS — R1310 Dysphagia, unspecified: Secondary | ICD-10-CM | POA: Diagnosis not present

## 2018-11-23 DIAGNOSIS — I509 Heart failure, unspecified: Secondary | ICD-10-CM | POA: Diagnosis not present

## 2018-11-23 DIAGNOSIS — G2401 Drug induced subacute dyskinesia: Secondary | ICD-10-CM | POA: Diagnosis not present

## 2018-11-25 DIAGNOSIS — G3183 Dementia with Lewy bodies: Secondary | ICD-10-CM | POA: Diagnosis not present

## 2018-11-25 DIAGNOSIS — I509 Heart failure, unspecified: Secondary | ICD-10-CM | POA: Diagnosis not present

## 2018-11-25 DIAGNOSIS — G2401 Drug induced subacute dyskinesia: Secondary | ICD-10-CM | POA: Diagnosis not present

## 2018-11-25 DIAGNOSIS — I951 Orthostatic hypotension: Secondary | ICD-10-CM | POA: Diagnosis not present

## 2018-11-25 DIAGNOSIS — R1310 Dysphagia, unspecified: Secondary | ICD-10-CM | POA: Diagnosis not present

## 2018-11-25 DIAGNOSIS — G20 Parkinson's disease: Secondary | ICD-10-CM | POA: Diagnosis not present

## 2018-11-27 DIAGNOSIS — G2401 Drug induced subacute dyskinesia: Secondary | ICD-10-CM | POA: Diagnosis not present

## 2018-11-27 DIAGNOSIS — I509 Heart failure, unspecified: Secondary | ICD-10-CM | POA: Diagnosis not present

## 2018-11-27 DIAGNOSIS — G3183 Dementia with Lewy bodies: Secondary | ICD-10-CM | POA: Diagnosis not present

## 2018-11-27 DIAGNOSIS — G20 Parkinson's disease: Secondary | ICD-10-CM | POA: Diagnosis not present

## 2018-11-27 DIAGNOSIS — R1310 Dysphagia, unspecified: Secondary | ICD-10-CM | POA: Diagnosis not present

## 2018-11-27 DIAGNOSIS — I951 Orthostatic hypotension: Secondary | ICD-10-CM | POA: Diagnosis not present

## 2018-11-30 DIAGNOSIS — I951 Orthostatic hypotension: Secondary | ICD-10-CM | POA: Diagnosis not present

## 2018-11-30 DIAGNOSIS — R1310 Dysphagia, unspecified: Secondary | ICD-10-CM | POA: Diagnosis not present

## 2018-11-30 DIAGNOSIS — L89896 Pressure-induced deep tissue damage of other site: Secondary | ICD-10-CM | POA: Diagnosis not present

## 2018-11-30 DIAGNOSIS — L02416 Cutaneous abscess of left lower limb: Secondary | ICD-10-CM | POA: Diagnosis not present

## 2018-11-30 DIAGNOSIS — G20 Parkinson's disease: Secondary | ICD-10-CM | POA: Diagnosis not present

## 2018-11-30 DIAGNOSIS — G2401 Drug induced subacute dyskinesia: Secondary | ICD-10-CM | POA: Diagnosis not present

## 2018-11-30 DIAGNOSIS — I509 Heart failure, unspecified: Secondary | ICD-10-CM | POA: Diagnosis not present

## 2018-11-30 DIAGNOSIS — G3183 Dementia with Lewy bodies: Secondary | ICD-10-CM | POA: Diagnosis not present

## 2018-11-30 DIAGNOSIS — L89154 Pressure ulcer of sacral region, stage 4: Secondary | ICD-10-CM | POA: Diagnosis not present

## 2018-12-01 DIAGNOSIS — G3183 Dementia with Lewy bodies: Secondary | ICD-10-CM | POA: Diagnosis not present

## 2018-12-01 DIAGNOSIS — I951 Orthostatic hypotension: Secondary | ICD-10-CM | POA: Diagnosis not present

## 2018-12-01 DIAGNOSIS — G2401 Drug induced subacute dyskinesia: Secondary | ICD-10-CM | POA: Diagnosis not present

## 2018-12-01 DIAGNOSIS — G20 Parkinson's disease: Secondary | ICD-10-CM | POA: Diagnosis not present

## 2018-12-01 DIAGNOSIS — R1310 Dysphagia, unspecified: Secondary | ICD-10-CM | POA: Diagnosis not present

## 2018-12-01 DIAGNOSIS — I509 Heart failure, unspecified: Secondary | ICD-10-CM | POA: Diagnosis not present

## 2018-12-02 DIAGNOSIS — I509 Heart failure, unspecified: Secondary | ICD-10-CM | POA: Diagnosis not present

## 2018-12-02 DIAGNOSIS — I951 Orthostatic hypotension: Secondary | ICD-10-CM | POA: Diagnosis not present

## 2018-12-02 DIAGNOSIS — R1310 Dysphagia, unspecified: Secondary | ICD-10-CM | POA: Diagnosis not present

## 2018-12-02 DIAGNOSIS — G3183 Dementia with Lewy bodies: Secondary | ICD-10-CM | POA: Diagnosis not present

## 2018-12-02 DIAGNOSIS — G20 Parkinson's disease: Secondary | ICD-10-CM | POA: Diagnosis not present

## 2018-12-02 DIAGNOSIS — G2401 Drug induced subacute dyskinesia: Secondary | ICD-10-CM | POA: Diagnosis not present

## 2018-12-03 DIAGNOSIS — G2401 Drug induced subacute dyskinesia: Secondary | ICD-10-CM | POA: Diagnosis not present

## 2018-12-03 DIAGNOSIS — I509 Heart failure, unspecified: Secondary | ICD-10-CM | POA: Diagnosis not present

## 2018-12-03 DIAGNOSIS — G3183 Dementia with Lewy bodies: Secondary | ICD-10-CM | POA: Diagnosis not present

## 2018-12-03 DIAGNOSIS — R1310 Dysphagia, unspecified: Secondary | ICD-10-CM | POA: Diagnosis not present

## 2018-12-03 DIAGNOSIS — G20 Parkinson's disease: Secondary | ICD-10-CM | POA: Diagnosis not present

## 2018-12-03 DIAGNOSIS — I951 Orthostatic hypotension: Secondary | ICD-10-CM | POA: Diagnosis not present

## 2018-12-07 DIAGNOSIS — G2401 Drug induced subacute dyskinesia: Secondary | ICD-10-CM | POA: Diagnosis not present

## 2018-12-07 DIAGNOSIS — G3183 Dementia with Lewy bodies: Secondary | ICD-10-CM | POA: Diagnosis not present

## 2018-12-07 DIAGNOSIS — I509 Heart failure, unspecified: Secondary | ICD-10-CM | POA: Diagnosis not present

## 2018-12-07 DIAGNOSIS — R1310 Dysphagia, unspecified: Secondary | ICD-10-CM | POA: Diagnosis not present

## 2018-12-07 DIAGNOSIS — I951 Orthostatic hypotension: Secondary | ICD-10-CM | POA: Diagnosis not present

## 2018-12-07 DIAGNOSIS — G20 Parkinson's disease: Secondary | ICD-10-CM | POA: Diagnosis not present

## 2018-12-09 DIAGNOSIS — G20 Parkinson's disease: Secondary | ICD-10-CM | POA: Diagnosis not present

## 2018-12-09 DIAGNOSIS — G3183 Dementia with Lewy bodies: Secondary | ICD-10-CM | POA: Diagnosis not present

## 2018-12-09 DIAGNOSIS — I509 Heart failure, unspecified: Secondary | ICD-10-CM | POA: Diagnosis not present

## 2018-12-09 DIAGNOSIS — R1310 Dysphagia, unspecified: Secondary | ICD-10-CM | POA: Diagnosis not present

## 2018-12-09 DIAGNOSIS — G2401 Drug induced subacute dyskinesia: Secondary | ICD-10-CM | POA: Diagnosis not present

## 2018-12-09 DIAGNOSIS — L03116 Cellulitis of left lower limb: Secondary | ICD-10-CM | POA: Diagnosis not present

## 2018-12-09 DIAGNOSIS — M6281 Muscle weakness (generalized): Secondary | ICD-10-CM | POA: Diagnosis not present

## 2018-12-09 DIAGNOSIS — L89154 Pressure ulcer of sacral region, stage 4: Secondary | ICD-10-CM | POA: Diagnosis not present

## 2018-12-09 DIAGNOSIS — I951 Orthostatic hypotension: Secondary | ICD-10-CM | POA: Diagnosis not present

## 2018-12-10 DIAGNOSIS — I509 Heart failure, unspecified: Secondary | ICD-10-CM | POA: Diagnosis not present

## 2018-12-10 DIAGNOSIS — I951 Orthostatic hypotension: Secondary | ICD-10-CM | POA: Diagnosis not present

## 2018-12-10 DIAGNOSIS — G2401 Drug induced subacute dyskinesia: Secondary | ICD-10-CM | POA: Diagnosis not present

## 2018-12-10 DIAGNOSIS — G3183 Dementia with Lewy bodies: Secondary | ICD-10-CM | POA: Diagnosis not present

## 2018-12-10 DIAGNOSIS — R1310 Dysphagia, unspecified: Secondary | ICD-10-CM | POA: Diagnosis not present

## 2018-12-10 DIAGNOSIS — G20 Parkinson's disease: Secondary | ICD-10-CM | POA: Diagnosis not present

## 2018-12-14 DIAGNOSIS — G3183 Dementia with Lewy bodies: Secondary | ICD-10-CM | POA: Diagnosis not present

## 2018-12-14 DIAGNOSIS — R1310 Dysphagia, unspecified: Secondary | ICD-10-CM | POA: Diagnosis not present

## 2018-12-14 DIAGNOSIS — I509 Heart failure, unspecified: Secondary | ICD-10-CM | POA: Diagnosis not present

## 2018-12-14 DIAGNOSIS — F0391 Unspecified dementia with behavioral disturbance: Secondary | ICD-10-CM | POA: Diagnosis not present

## 2018-12-14 DIAGNOSIS — L89154 Pressure ulcer of sacral region, stage 4: Secondary | ICD-10-CM | POA: Diagnosis not present

## 2018-12-14 DIAGNOSIS — R1312 Dysphagia, oropharyngeal phase: Secondary | ICD-10-CM | POA: Diagnosis not present

## 2018-12-14 DIAGNOSIS — G2401 Drug induced subacute dyskinesia: Secondary | ICD-10-CM | POA: Diagnosis not present

## 2018-12-14 DIAGNOSIS — G20 Parkinson's disease: Secondary | ICD-10-CM | POA: Diagnosis not present

## 2018-12-14 DIAGNOSIS — I951 Orthostatic hypotension: Secondary | ICD-10-CM | POA: Diagnosis not present

## 2018-12-16 DIAGNOSIS — G3183 Dementia with Lewy bodies: Secondary | ICD-10-CM | POA: Diagnosis not present

## 2018-12-16 DIAGNOSIS — I509 Heart failure, unspecified: Secondary | ICD-10-CM | POA: Diagnosis not present

## 2018-12-16 DIAGNOSIS — G2401 Drug induced subacute dyskinesia: Secondary | ICD-10-CM | POA: Diagnosis not present

## 2018-12-16 DIAGNOSIS — R1310 Dysphagia, unspecified: Secondary | ICD-10-CM | POA: Diagnosis not present

## 2018-12-16 DIAGNOSIS — G20 Parkinson's disease: Secondary | ICD-10-CM | POA: Diagnosis not present

## 2018-12-16 DIAGNOSIS — I951 Orthostatic hypotension: Secondary | ICD-10-CM | POA: Diagnosis not present

## 2018-12-18 DIAGNOSIS — I951 Orthostatic hypotension: Secondary | ICD-10-CM | POA: Diagnosis not present

## 2018-12-18 DIAGNOSIS — G3183 Dementia with Lewy bodies: Secondary | ICD-10-CM | POA: Diagnosis not present

## 2018-12-18 DIAGNOSIS — R1310 Dysphagia, unspecified: Secondary | ICD-10-CM | POA: Diagnosis not present

## 2018-12-18 DIAGNOSIS — G2401 Drug induced subacute dyskinesia: Secondary | ICD-10-CM | POA: Diagnosis not present

## 2018-12-18 DIAGNOSIS — I509 Heart failure, unspecified: Secondary | ICD-10-CM | POA: Diagnosis not present

## 2018-12-18 DIAGNOSIS — G20 Parkinson's disease: Secondary | ICD-10-CM | POA: Diagnosis not present

## 2018-12-22 DIAGNOSIS — G2401 Drug induced subacute dyskinesia: Secondary | ICD-10-CM | POA: Diagnosis not present

## 2018-12-22 DIAGNOSIS — G3183 Dementia with Lewy bodies: Secondary | ICD-10-CM | POA: Diagnosis not present

## 2018-12-22 DIAGNOSIS — G20 Parkinson's disease: Secondary | ICD-10-CM | POA: Diagnosis not present

## 2018-12-22 DIAGNOSIS — R1310 Dysphagia, unspecified: Secondary | ICD-10-CM | POA: Diagnosis not present

## 2018-12-22 DIAGNOSIS — I951 Orthostatic hypotension: Secondary | ICD-10-CM | POA: Diagnosis not present

## 2018-12-22 DIAGNOSIS — I509 Heart failure, unspecified: Secondary | ICD-10-CM | POA: Diagnosis not present

## 2018-12-23 DIAGNOSIS — K219 Gastro-esophageal reflux disease without esophagitis: Secondary | ICD-10-CM | POA: Diagnosis not present

## 2018-12-23 DIAGNOSIS — I951 Orthostatic hypotension: Secondary | ICD-10-CM | POA: Diagnosis not present

## 2018-12-23 DIAGNOSIS — E785 Hyperlipidemia, unspecified: Secondary | ICD-10-CM | POA: Diagnosis not present

## 2018-12-23 DIAGNOSIS — I110 Hypertensive heart disease with heart failure: Secondary | ICD-10-CM | POA: Diagnosis not present

## 2018-12-23 DIAGNOSIS — I69391 Dysphagia following cerebral infarction: Secondary | ICD-10-CM | POA: Diagnosis not present

## 2018-12-23 DIAGNOSIS — L89154 Pressure ulcer of sacral region, stage 4: Secondary | ICD-10-CM | POA: Diagnosis not present

## 2018-12-23 DIAGNOSIS — I509 Heart failure, unspecified: Secondary | ICD-10-CM | POA: Diagnosis not present

## 2018-12-23 DIAGNOSIS — F419 Anxiety disorder, unspecified: Secondary | ICD-10-CM | POA: Diagnosis not present

## 2018-12-23 DIAGNOSIS — E039 Hypothyroidism, unspecified: Secondary | ICD-10-CM | POA: Diagnosis not present

## 2018-12-23 DIAGNOSIS — F0280 Dementia in other diseases classified elsewhere without behavioral disturbance: Secondary | ICD-10-CM | POA: Diagnosis not present

## 2018-12-23 DIAGNOSIS — H04129 Dry eye syndrome of unspecified lacrimal gland: Secondary | ICD-10-CM | POA: Diagnosis not present

## 2018-12-23 DIAGNOSIS — G3183 Dementia with Lewy bodies: Secondary | ICD-10-CM | POA: Diagnosis not present

## 2018-12-23 DIAGNOSIS — G2401 Drug induced subacute dyskinesia: Secondary | ICD-10-CM | POA: Diagnosis not present

## 2018-12-23 DIAGNOSIS — H409 Unspecified glaucoma: Secondary | ICD-10-CM | POA: Diagnosis not present

## 2018-12-24 DIAGNOSIS — I951 Orthostatic hypotension: Secondary | ICD-10-CM | POA: Diagnosis not present

## 2018-12-24 DIAGNOSIS — G3183 Dementia with Lewy bodies: Secondary | ICD-10-CM | POA: Diagnosis not present

## 2018-12-24 DIAGNOSIS — F0280 Dementia in other diseases classified elsewhere without behavioral disturbance: Secondary | ICD-10-CM | POA: Diagnosis not present

## 2018-12-24 DIAGNOSIS — I69391 Dysphagia following cerebral infarction: Secondary | ICD-10-CM | POA: Diagnosis not present

## 2018-12-24 DIAGNOSIS — G2401 Drug induced subacute dyskinesia: Secondary | ICD-10-CM | POA: Diagnosis not present

## 2018-12-24 DIAGNOSIS — I110 Hypertensive heart disease with heart failure: Secondary | ICD-10-CM | POA: Diagnosis not present

## 2018-12-30 DIAGNOSIS — I10 Essential (primary) hypertension: Secondary | ICD-10-CM | POA: Diagnosis not present

## 2018-12-30 DIAGNOSIS — F411 Generalized anxiety disorder: Secondary | ICD-10-CM | POA: Diagnosis not present

## 2018-12-30 DIAGNOSIS — Z66 Do not resuscitate: Secondary | ICD-10-CM | POA: Diagnosis not present

## 2018-12-30 DIAGNOSIS — F29 Unspecified psychosis not due to a substance or known physiological condition: Secondary | ICD-10-CM | POA: Diagnosis not present

## 2018-12-30 DIAGNOSIS — G20 Parkinson's disease: Secondary | ICD-10-CM | POA: Diagnosis not present

## 2018-12-31 DIAGNOSIS — I951 Orthostatic hypotension: Secondary | ICD-10-CM | POA: Diagnosis not present

## 2018-12-31 DIAGNOSIS — F0280 Dementia in other diseases classified elsewhere without behavioral disturbance: Secondary | ICD-10-CM | POA: Diagnosis not present

## 2018-12-31 DIAGNOSIS — I110 Hypertensive heart disease with heart failure: Secondary | ICD-10-CM | POA: Diagnosis not present

## 2018-12-31 DIAGNOSIS — G3183 Dementia with Lewy bodies: Secondary | ICD-10-CM | POA: Diagnosis not present

## 2018-12-31 DIAGNOSIS — I69391 Dysphagia following cerebral infarction: Secondary | ICD-10-CM | POA: Diagnosis not present

## 2018-12-31 DIAGNOSIS — G2401 Drug induced subacute dyskinesia: Secondary | ICD-10-CM | POA: Diagnosis not present

## 2019-01-08 DIAGNOSIS — F0390 Unspecified dementia without behavioral disturbance: Secondary | ICD-10-CM | POA: Diagnosis not present

## 2019-01-08 DIAGNOSIS — L89154 Pressure ulcer of sacral region, stage 4: Secondary | ICD-10-CM | POA: Diagnosis not present

## 2019-01-18 DIAGNOSIS — M6281 Muscle weakness (generalized): Secondary | ICD-10-CM | POA: Diagnosis not present

## 2019-01-18 DIAGNOSIS — L89154 Pressure ulcer of sacral region, stage 4: Secondary | ICD-10-CM | POA: Diagnosis not present

## 2019-01-18 DIAGNOSIS — G20 Parkinson's disease: Secondary | ICD-10-CM | POA: Diagnosis not present

## 2019-01-18 DIAGNOSIS — L03116 Cellulitis of left lower limb: Secondary | ICD-10-CM | POA: Diagnosis not present

## 2019-01-22 DIAGNOSIS — G2401 Drug induced subacute dyskinesia: Secondary | ICD-10-CM | POA: Diagnosis not present

## 2019-01-22 DIAGNOSIS — K219 Gastro-esophageal reflux disease without esophagitis: Secondary | ICD-10-CM | POA: Diagnosis not present

## 2019-01-22 DIAGNOSIS — I951 Orthostatic hypotension: Secondary | ICD-10-CM | POA: Diagnosis not present

## 2019-01-22 DIAGNOSIS — E039 Hypothyroidism, unspecified: Secondary | ICD-10-CM | POA: Diagnosis not present

## 2019-01-22 DIAGNOSIS — E785 Hyperlipidemia, unspecified: Secondary | ICD-10-CM | POA: Diagnosis not present

## 2019-01-22 DIAGNOSIS — I69391 Dysphagia following cerebral infarction: Secondary | ICD-10-CM | POA: Diagnosis not present

## 2019-01-22 DIAGNOSIS — F0280 Dementia in other diseases classified elsewhere without behavioral disturbance: Secondary | ICD-10-CM | POA: Diagnosis not present

## 2019-01-22 DIAGNOSIS — L89154 Pressure ulcer of sacral region, stage 4: Secondary | ICD-10-CM | POA: Diagnosis not present

## 2019-01-22 DIAGNOSIS — I110 Hypertensive heart disease with heart failure: Secondary | ICD-10-CM | POA: Diagnosis not present

## 2019-01-22 DIAGNOSIS — H409 Unspecified glaucoma: Secondary | ICD-10-CM | POA: Diagnosis not present

## 2019-01-22 DIAGNOSIS — I509 Heart failure, unspecified: Secondary | ICD-10-CM | POA: Diagnosis not present

## 2019-01-22 DIAGNOSIS — G3183 Dementia with Lewy bodies: Secondary | ICD-10-CM | POA: Diagnosis not present

## 2019-01-22 DIAGNOSIS — H04129 Dry eye syndrome of unspecified lacrimal gland: Secondary | ICD-10-CM | POA: Diagnosis not present

## 2019-01-22 DIAGNOSIS — F419 Anxiety disorder, unspecified: Secondary | ICD-10-CM | POA: Diagnosis not present

## 2019-01-25 DIAGNOSIS — L89154 Pressure ulcer of sacral region, stage 4: Secondary | ICD-10-CM | POA: Diagnosis not present

## 2019-01-25 DIAGNOSIS — S51802A Unspecified open wound of left forearm, initial encounter: Secondary | ICD-10-CM | POA: Diagnosis not present

## 2019-01-25 DIAGNOSIS — G20 Parkinson's disease: Secondary | ICD-10-CM | POA: Diagnosis not present

## 2019-01-25 DIAGNOSIS — F0390 Unspecified dementia without behavioral disturbance: Secondary | ICD-10-CM | POA: Diagnosis not present

## 2019-01-29 DIAGNOSIS — I951 Orthostatic hypotension: Secondary | ICD-10-CM | POA: Diagnosis not present

## 2019-01-29 DIAGNOSIS — F0280 Dementia in other diseases classified elsewhere without behavioral disturbance: Secondary | ICD-10-CM | POA: Diagnosis not present

## 2019-01-29 DIAGNOSIS — G3183 Dementia with Lewy bodies: Secondary | ICD-10-CM | POA: Diagnosis not present

## 2019-01-29 DIAGNOSIS — I69391 Dysphagia following cerebral infarction: Secondary | ICD-10-CM | POA: Diagnosis not present

## 2019-01-29 DIAGNOSIS — G2401 Drug induced subacute dyskinesia: Secondary | ICD-10-CM | POA: Diagnosis not present

## 2019-01-29 DIAGNOSIS — I110 Hypertensive heart disease with heart failure: Secondary | ICD-10-CM | POA: Diagnosis not present

## 2019-02-22 DIAGNOSIS — E785 Hyperlipidemia, unspecified: Secondary | ICD-10-CM | POA: Diagnosis not present

## 2019-02-22 DIAGNOSIS — H409 Unspecified glaucoma: Secondary | ICD-10-CM | POA: Diagnosis not present

## 2019-02-22 DIAGNOSIS — S5001XD Contusion of right elbow, subsequent encounter: Secondary | ICD-10-CM | POA: Diagnosis not present

## 2019-02-22 DIAGNOSIS — K219 Gastro-esophageal reflux disease without esophagitis: Secondary | ICD-10-CM | POA: Diagnosis not present

## 2019-02-22 DIAGNOSIS — H04129 Dry eye syndrome of unspecified lacrimal gland: Secondary | ICD-10-CM | POA: Diagnosis not present

## 2019-02-22 DIAGNOSIS — G2401 Drug induced subacute dyskinesia: Secondary | ICD-10-CM | POA: Diagnosis not present

## 2019-02-22 DIAGNOSIS — R32 Unspecified urinary incontinence: Secondary | ICD-10-CM | POA: Diagnosis not present

## 2019-02-22 DIAGNOSIS — I509 Heart failure, unspecified: Secondary | ICD-10-CM | POA: Diagnosis not present

## 2019-02-22 DIAGNOSIS — Z993 Dependence on wheelchair: Secondary | ICD-10-CM | POA: Diagnosis not present

## 2019-02-22 DIAGNOSIS — G3183 Dementia with Lewy bodies: Secondary | ICD-10-CM | POA: Diagnosis not present

## 2019-02-22 DIAGNOSIS — I69391 Dysphagia following cerebral infarction: Secondary | ICD-10-CM | POA: Diagnosis not present

## 2019-02-22 DIAGNOSIS — R159 Full incontinence of feces: Secondary | ICD-10-CM | POA: Diagnosis not present

## 2019-02-22 DIAGNOSIS — L89154 Pressure ulcer of sacral region, stage 4: Secondary | ICD-10-CM | POA: Diagnosis not present

## 2019-02-22 DIAGNOSIS — E039 Hypothyroidism, unspecified: Secondary | ICD-10-CM | POA: Diagnosis not present

## 2019-02-22 DIAGNOSIS — Z681 Body mass index (BMI) 19 or less, adult: Secondary | ICD-10-CM | POA: Diagnosis not present

## 2019-02-22 DIAGNOSIS — I110 Hypertensive heart disease with heart failure: Secondary | ICD-10-CM | POA: Diagnosis not present

## 2019-02-22 DIAGNOSIS — F0280 Dementia in other diseases classified elsewhere without behavioral disturbance: Secondary | ICD-10-CM | POA: Diagnosis not present

## 2019-02-22 DIAGNOSIS — I951 Orthostatic hypotension: Secondary | ICD-10-CM | POA: Diagnosis not present

## 2019-02-22 DIAGNOSIS — F419 Anxiety disorder, unspecified: Secondary | ICD-10-CM | POA: Diagnosis not present

## 2019-02-22 DIAGNOSIS — S5002XD Contusion of left elbow, subsequent encounter: Secondary | ICD-10-CM | POA: Diagnosis not present

## 2019-03-09 DIAGNOSIS — Z20828 Contact with and (suspected) exposure to other viral communicable diseases: Secondary | ICD-10-CM | POA: Diagnosis not present

## 2019-03-09 DIAGNOSIS — Z1383 Encounter for screening for respiratory disorder NEC: Secondary | ICD-10-CM | POA: Diagnosis not present

## 2019-03-12 DIAGNOSIS — U071 COVID-19: Secondary | ICD-10-CM | POA: Diagnosis not present

## 2019-03-12 DIAGNOSIS — F29 Unspecified psychosis not due to a substance or known physiological condition: Secondary | ICD-10-CM | POA: Diagnosis not present

## 2019-03-12 DIAGNOSIS — F0151 Vascular dementia with behavioral disturbance: Secondary | ICD-10-CM | POA: Diagnosis not present

## 2019-03-12 DIAGNOSIS — G20 Parkinson's disease: Secondary | ICD-10-CM | POA: Diagnosis not present

## 2019-03-12 DIAGNOSIS — I10 Essential (primary) hypertension: Secondary | ICD-10-CM | POA: Diagnosis not present

## 2019-03-12 DIAGNOSIS — I5023 Acute on chronic systolic (congestive) heart failure: Secondary | ICD-10-CM | POA: Diagnosis not present

## 2019-03-12 DIAGNOSIS — R1312 Dysphagia, oropharyngeal phase: Secondary | ICD-10-CM | POA: Diagnosis not present

## 2019-03-12 DIAGNOSIS — E038 Other specified hypothyroidism: Secondary | ICD-10-CM | POA: Diagnosis not present

## 2019-03-15 DIAGNOSIS — R6889 Other general symptoms and signs: Secondary | ICD-10-CM | POA: Diagnosis not present

## 2019-03-15 DIAGNOSIS — U071 COVID-19: Secondary | ICD-10-CM | POA: Diagnosis not present

## 2019-03-15 DIAGNOSIS — I5021 Acute systolic (congestive) heart failure: Secondary | ICD-10-CM | POA: Diagnosis not present

## 2019-03-15 DIAGNOSIS — F331 Major depressive disorder, recurrent, moderate: Secondary | ICD-10-CM | POA: Diagnosis not present

## 2019-03-15 DIAGNOSIS — H409 Unspecified glaucoma: Secondary | ICD-10-CM | POA: Diagnosis not present

## 2019-03-15 DIAGNOSIS — M150 Primary generalized (osteo)arthritis: Secondary | ICD-10-CM | POA: Diagnosis not present

## 2019-03-16 DIAGNOSIS — B9729 Other coronavirus as the cause of diseases classified elsewhere: Secondary | ICD-10-CM | POA: Diagnosis not present

## 2019-03-24 DEATH — deceased
